# Patient Record
Sex: Female | Born: 1935
Health system: Southern US, Community
[De-identification: ages and names within clinical notes are randomized; demographics above are authoritative.]

## PROBLEM LIST (undated history)

## (undated) DIAGNOSIS — E785 Hyperlipidemia, unspecified: Secondary | ICD-10-CM

## (undated) DIAGNOSIS — C3490 Malignant neoplasm of unspecified part of unspecified bronchus or lung: Secondary | ICD-10-CM

## (undated) DIAGNOSIS — IMO0002 Reserved for concepts with insufficient information to code with codable children: Secondary | ICD-10-CM

## (undated) DIAGNOSIS — I509 Heart failure, unspecified: Secondary | ICD-10-CM

## (undated) DIAGNOSIS — K5792 Diverticulitis of intestine, part unspecified, without perforation or abscess without bleeding: Secondary | ICD-10-CM

## (undated) DIAGNOSIS — F329 Major depressive disorder, single episode, unspecified: Secondary | ICD-10-CM

## (undated) DIAGNOSIS — K219 Gastro-esophageal reflux disease without esophagitis: Secondary | ICD-10-CM

## (undated) DIAGNOSIS — D329 Benign neoplasm of meninges, unspecified: Secondary | ICD-10-CM

## (undated) DIAGNOSIS — D1803 Hemangioma of intra-abdominal structures: Secondary | ICD-10-CM

## (undated) DIAGNOSIS — I10 Essential (primary) hypertension: Secondary | ICD-10-CM

## (undated) DIAGNOSIS — E119 Type 2 diabetes mellitus without complications: Secondary | ICD-10-CM

## (undated) DIAGNOSIS — I639 Cerebral infarction, unspecified: Secondary | ICD-10-CM

## (undated) HISTORY — DX: Essential (primary) hypertension: I10

## (undated) HISTORY — DX: Diverticulitis of intestine, part unspecified, without perforation or abscess without bleeding: K57.92

## (undated) HISTORY — DX: Malignant neoplasm of unspecified part of unspecified bronchus or lung: C34.90

## (undated) HISTORY — DX: Hyperlipidemia, unspecified: E78.5

## (undated) HISTORY — DX: Major depressive disorder, single episode, unspecified: F32.9

## (undated) HISTORY — DX: Heart failure, unspecified: I50.9

## (undated) HISTORY — DX: Cerebral infarction, unspecified: I63.9

## (undated) HISTORY — DX: Reserved for concepts with insufficient information to code with codable children: IMO0002

## (undated) HISTORY — DX: Benign neoplasm of meninges, unspecified: D32.9

## (undated) HISTORY — DX: Hemangioma of intra-abdominal structures: D18.03

## (undated) HISTORY — PX: ABDOMINAL HYSTERECTOMY: SHX81

## (undated) HISTORY — PX: OTHER SURGICAL HISTORY: SHX169

## (undated) HISTORY — DX: Gastro-esophageal reflux disease without esophagitis: K21.9

## (undated) HISTORY — DX: Type 2 diabetes mellitus without complications: E11.9

---

## 1998-02-16 DIAGNOSIS — E119 Type 2 diabetes mellitus without complications: Secondary | ICD-10-CM

## 1998-02-16 HISTORY — DX: Type 2 diabetes mellitus without complications: E11.9

## 1998-10-03 ENCOUNTER — Encounter: Payer: Self-pay | Admitting: Internal Medicine

## 1998-10-03 ENCOUNTER — Ambulatory Visit (HOSPITAL_COMMUNITY): Admission: RE | Admit: 1998-10-03 | Discharge: 1998-10-03 | Payer: Self-pay | Admitting: Internal Medicine

## 1998-11-07 ENCOUNTER — Encounter: Payer: Self-pay | Admitting: Internal Medicine

## 1998-11-07 ENCOUNTER — Ambulatory Visit (HOSPITAL_COMMUNITY): Admission: RE | Admit: 1998-11-07 | Discharge: 1998-11-07 | Payer: Self-pay | Admitting: Internal Medicine

## 1999-09-01 ENCOUNTER — Encounter: Payer: Self-pay | Admitting: Emergency Medicine

## 1999-09-01 ENCOUNTER — Emergency Department (HOSPITAL_COMMUNITY): Admission: EM | Admit: 1999-09-01 | Discharge: 1999-09-01 | Payer: Self-pay | Admitting: Emergency Medicine

## 1999-12-12 ENCOUNTER — Ambulatory Visit (HOSPITAL_COMMUNITY): Admission: RE | Admit: 1999-12-12 | Discharge: 1999-12-12 | Payer: Self-pay | Admitting: *Deleted

## 2000-08-17 ENCOUNTER — Ambulatory Visit (HOSPITAL_COMMUNITY): Admission: RE | Admit: 2000-08-17 | Discharge: 2000-08-17 | Payer: Self-pay | Admitting: Family Medicine

## 2002-03-15 ENCOUNTER — Ambulatory Visit (HOSPITAL_COMMUNITY): Admission: RE | Admit: 2002-03-15 | Discharge: 2002-03-15 | Payer: Self-pay | Admitting: Internal Medicine

## 2002-03-15 ENCOUNTER — Encounter: Admission: RE | Admit: 2002-03-15 | Discharge: 2002-03-15 | Payer: Self-pay | Admitting: Internal Medicine

## 2002-03-15 ENCOUNTER — Encounter: Payer: Self-pay | Admitting: Internal Medicine

## 2002-03-16 ENCOUNTER — Encounter: Admission: RE | Admit: 2002-03-16 | Discharge: 2002-03-16 | Payer: Self-pay | Admitting: Internal Medicine

## 2002-03-22 ENCOUNTER — Encounter: Admission: RE | Admit: 2002-03-22 | Discharge: 2002-03-22 | Payer: Self-pay | Admitting: Internal Medicine

## 2002-03-29 ENCOUNTER — Encounter: Admission: RE | Admit: 2002-03-29 | Discharge: 2002-06-27 | Payer: Self-pay | Admitting: Internal Medicine

## 2002-04-19 ENCOUNTER — Encounter: Admission: RE | Admit: 2002-04-19 | Discharge: 2002-04-19 | Payer: Self-pay | Admitting: Internal Medicine

## 2002-05-01 ENCOUNTER — Encounter: Admission: RE | Admit: 2002-05-01 | Discharge: 2002-05-01 | Payer: Self-pay | Admitting: Internal Medicine

## 2002-05-01 ENCOUNTER — Encounter (INDEPENDENT_AMBULATORY_CARE_PROVIDER_SITE_OTHER): Payer: Self-pay | Admitting: Unknown Physician Specialty

## 2002-05-03 ENCOUNTER — Encounter: Admission: RE | Admit: 2002-05-03 | Discharge: 2002-05-03 | Payer: Self-pay | Admitting: Internal Medicine

## 2002-06-30 ENCOUNTER — Encounter: Admission: RE | Admit: 2002-06-30 | Discharge: 2002-06-30 | Payer: Self-pay | Admitting: Internal Medicine

## 2002-07-01 ENCOUNTER — Inpatient Hospital Stay (HOSPITAL_COMMUNITY): Admission: EM | Admit: 2002-07-01 | Discharge: 2002-07-04 | Payer: Self-pay

## 2002-07-03 ENCOUNTER — Encounter (INDEPENDENT_AMBULATORY_CARE_PROVIDER_SITE_OTHER): Payer: Self-pay | Admitting: Cardiology

## 2002-07-05 ENCOUNTER — Encounter: Admission: RE | Admit: 2002-07-05 | Discharge: 2002-07-05 | Payer: Self-pay | Admitting: Internal Medicine

## 2002-07-20 ENCOUNTER — Encounter: Admission: RE | Admit: 2002-07-20 | Discharge: 2002-07-20 | Payer: Self-pay | Admitting: Internal Medicine

## 2002-09-08 ENCOUNTER — Encounter: Admission: RE | Admit: 2002-09-08 | Discharge: 2002-09-08 | Payer: Self-pay | Admitting: Internal Medicine

## 2002-10-02 ENCOUNTER — Encounter: Payer: Self-pay | Admitting: Gastroenterology

## 2002-11-06 ENCOUNTER — Encounter: Admission: RE | Admit: 2002-11-06 | Discharge: 2002-11-06 | Payer: Self-pay | Admitting: Internal Medicine

## 2002-11-23 ENCOUNTER — Encounter: Admission: RE | Admit: 2002-11-23 | Discharge: 2002-11-23 | Payer: Self-pay | Admitting: Internal Medicine

## 2002-12-27 ENCOUNTER — Encounter: Admission: RE | Admit: 2002-12-27 | Discharge: 2002-12-27 | Payer: Self-pay | Admitting: Internal Medicine

## 2002-12-28 ENCOUNTER — Encounter: Admission: RE | Admit: 2002-12-28 | Discharge: 2003-01-31 | Payer: Self-pay | Admitting: Internal Medicine

## 2003-01-01 ENCOUNTER — Encounter: Admission: RE | Admit: 2003-01-01 | Discharge: 2003-01-01 | Payer: Self-pay | Admitting: Internal Medicine

## 2003-03-02 ENCOUNTER — Encounter: Admission: RE | Admit: 2003-03-02 | Discharge: 2003-03-02 | Payer: Self-pay | Admitting: Internal Medicine

## 2003-03-08 ENCOUNTER — Ambulatory Visit (HOSPITAL_COMMUNITY): Admission: RE | Admit: 2003-03-08 | Discharge: 2003-03-08 | Payer: Self-pay | Admitting: Ophthalmology

## 2003-04-09 ENCOUNTER — Encounter: Admission: RE | Admit: 2003-04-09 | Discharge: 2003-04-09 | Payer: Self-pay | Admitting: Internal Medicine

## 2003-05-03 ENCOUNTER — Encounter: Admission: RE | Admit: 2003-05-03 | Discharge: 2003-05-03 | Payer: Self-pay | Admitting: Internal Medicine

## 2003-05-10 ENCOUNTER — Encounter: Admission: RE | Admit: 2003-05-10 | Discharge: 2003-05-10 | Payer: Self-pay | Admitting: Internal Medicine

## 2003-05-25 ENCOUNTER — Ambulatory Visit (HOSPITAL_COMMUNITY): Admission: RE | Admit: 2003-05-25 | Discharge: 2003-05-25 | Payer: Self-pay | Admitting: Ophthalmology

## 2003-06-08 ENCOUNTER — Encounter: Admission: RE | Admit: 2003-06-08 | Discharge: 2003-06-08 | Payer: Self-pay | Admitting: Internal Medicine

## 2003-06-16 ENCOUNTER — Ambulatory Visit (HOSPITAL_COMMUNITY): Admission: RE | Admit: 2003-06-16 | Discharge: 2003-06-16 | Payer: Self-pay | Admitting: Internal Medicine

## 2003-06-18 ENCOUNTER — Encounter: Admission: RE | Admit: 2003-06-18 | Discharge: 2003-06-18 | Payer: Self-pay | Admitting: Internal Medicine

## 2003-06-29 ENCOUNTER — Encounter: Admission: RE | Admit: 2003-06-29 | Discharge: 2003-06-29 | Payer: Self-pay | Admitting: Internal Medicine

## 2003-07-04 ENCOUNTER — Ambulatory Visit (HOSPITAL_COMMUNITY): Admission: RE | Admit: 2003-07-04 | Discharge: 2003-07-04 | Payer: Self-pay | Admitting: Internal Medicine

## 2003-07-27 ENCOUNTER — Encounter: Admission: RE | Admit: 2003-07-27 | Discharge: 2003-07-27 | Payer: Self-pay | Admitting: Internal Medicine

## 2003-09-06 ENCOUNTER — Encounter: Admission: RE | Admit: 2003-09-06 | Discharge: 2003-09-06 | Payer: Self-pay | Admitting: Internal Medicine

## 2003-10-26 ENCOUNTER — Ambulatory Visit: Payer: Self-pay | Admitting: Internal Medicine

## 2003-11-09 ENCOUNTER — Ambulatory Visit: Payer: Self-pay | Admitting: Internal Medicine

## 2003-12-15 ENCOUNTER — Emergency Department (HOSPITAL_COMMUNITY): Admission: EM | Admit: 2003-12-15 | Discharge: 2003-12-15 | Payer: Self-pay | Admitting: Family Medicine

## 2004-01-22 ENCOUNTER — Ambulatory Visit: Payer: Self-pay | Admitting: Internal Medicine

## 2004-01-29 ENCOUNTER — Ambulatory Visit (HOSPITAL_COMMUNITY): Admission: RE | Admit: 2004-01-29 | Discharge: 2004-01-29 | Payer: Self-pay | Admitting: Internal Medicine

## 2004-01-29 ENCOUNTER — Ambulatory Visit: Payer: Self-pay | Admitting: Internal Medicine

## 2004-02-17 DIAGNOSIS — I639 Cerebral infarction, unspecified: Secondary | ICD-10-CM

## 2004-02-17 HISTORY — DX: Cerebral infarction, unspecified: I63.9

## 2004-02-28 ENCOUNTER — Ambulatory Visit: Payer: Self-pay | Admitting: Internal Medicine

## 2004-03-13 ENCOUNTER — Ambulatory Visit: Payer: Self-pay | Admitting: Internal Medicine

## 2004-05-08 ENCOUNTER — Ambulatory Visit: Payer: Self-pay | Admitting: Internal Medicine

## 2004-06-17 ENCOUNTER — Ambulatory Visit: Payer: Self-pay | Admitting: Internal Medicine

## 2004-06-20 ENCOUNTER — Ambulatory Visit (HOSPITAL_COMMUNITY): Admission: RE | Admit: 2004-06-20 | Discharge: 2004-06-20 | Payer: Self-pay

## 2004-06-24 ENCOUNTER — Ambulatory Visit (HOSPITAL_COMMUNITY): Admission: RE | Admit: 2004-06-24 | Discharge: 2004-06-24 | Payer: Self-pay | Admitting: Internal Medicine

## 2004-07-01 ENCOUNTER — Ambulatory Visit: Payer: Self-pay | Admitting: Internal Medicine

## 2004-07-15 ENCOUNTER — Ambulatory Visit: Payer: Self-pay | Admitting: Internal Medicine

## 2004-07-30 ENCOUNTER — Ambulatory Visit: Payer: Self-pay | Admitting: Internal Medicine

## 2004-08-21 ENCOUNTER — Encounter: Admission: RE | Admit: 2004-08-21 | Discharge: 2004-11-07 | Payer: Self-pay | Admitting: Orthopedic Surgery

## 2004-09-04 ENCOUNTER — Ambulatory Visit (HOSPITAL_COMMUNITY): Admission: RE | Admit: 2004-09-04 | Discharge: 2004-09-04 | Payer: Self-pay | Admitting: Family Medicine

## 2004-09-04 ENCOUNTER — Encounter (INDEPENDENT_AMBULATORY_CARE_PROVIDER_SITE_OTHER): Payer: Self-pay | Admitting: *Deleted

## 2004-09-04 ENCOUNTER — Emergency Department (HOSPITAL_COMMUNITY): Admission: EM | Admit: 2004-09-04 | Discharge: 2004-09-04 | Payer: Self-pay | Admitting: Family Medicine

## 2004-09-08 ENCOUNTER — Ambulatory Visit: Payer: Self-pay | Admitting: Internal Medicine

## 2004-09-11 ENCOUNTER — Encounter (INDEPENDENT_AMBULATORY_CARE_PROVIDER_SITE_OTHER): Payer: Self-pay | Admitting: *Deleted

## 2004-09-11 ENCOUNTER — Ambulatory Visit: Payer: Self-pay | Admitting: Internal Medicine

## 2004-09-11 ENCOUNTER — Ambulatory Visit (HOSPITAL_COMMUNITY): Admission: RE | Admit: 2004-09-11 | Discharge: 2004-09-11 | Payer: Self-pay | Admitting: Internal Medicine

## 2004-09-17 ENCOUNTER — Ambulatory Visit: Payer: Self-pay

## 2004-12-31 ENCOUNTER — Ambulatory Visit: Payer: Self-pay | Admitting: Internal Medicine

## 2005-01-21 ENCOUNTER — Encounter (INDEPENDENT_AMBULATORY_CARE_PROVIDER_SITE_OTHER): Payer: Self-pay | Admitting: Unknown Physician Specialty

## 2005-01-27 ENCOUNTER — Ambulatory Visit (HOSPITAL_COMMUNITY): Admission: RE | Admit: 2005-01-27 | Discharge: 2005-01-27 | Payer: Self-pay | Admitting: *Deleted

## 2005-02-03 ENCOUNTER — Ambulatory Visit: Admission: RE | Admit: 2005-02-03 | Discharge: 2005-02-03 | Payer: Self-pay | Admitting: Internal Medicine

## 2005-03-03 ENCOUNTER — Ambulatory Visit: Payer: Self-pay | Admitting: Internal Medicine

## 2005-03-09 ENCOUNTER — Ambulatory Visit: Payer: Self-pay | Admitting: Hospitalist

## 2005-06-01 ENCOUNTER — Ambulatory Visit: Payer: Self-pay | Admitting: Internal Medicine

## 2005-07-14 ENCOUNTER — Ambulatory Visit: Payer: Self-pay | Admitting: Internal Medicine

## 2005-09-07 ENCOUNTER — Ambulatory Visit: Payer: Self-pay | Admitting: Internal Medicine

## 2005-09-14 ENCOUNTER — Ambulatory Visit: Payer: Self-pay | Admitting: Hospitalist

## 2005-10-20 ENCOUNTER — Encounter (INDEPENDENT_AMBULATORY_CARE_PROVIDER_SITE_OTHER): Payer: Self-pay | Admitting: Unknown Physician Specialty

## 2005-10-20 ENCOUNTER — Ambulatory Visit: Payer: Self-pay | Admitting: Hospitalist

## 2005-12-04 DIAGNOSIS — E785 Hyperlipidemia, unspecified: Secondary | ICD-10-CM

## 2005-12-04 DIAGNOSIS — I152 Hypertension secondary to endocrine disorders: Secondary | ICD-10-CM | POA: Insufficient documentation

## 2005-12-04 DIAGNOSIS — Z794 Long term (current) use of insulin: Secondary | ICD-10-CM

## 2005-12-04 DIAGNOSIS — E1165 Type 2 diabetes mellitus with hyperglycemia: Secondary | ICD-10-CM

## 2005-12-04 DIAGNOSIS — K219 Gastro-esophageal reflux disease without esophagitis: Secondary | ICD-10-CM

## 2005-12-04 DIAGNOSIS — E1169 Type 2 diabetes mellitus with other specified complication: Secondary | ICD-10-CM | POA: Insufficient documentation

## 2005-12-04 DIAGNOSIS — E1159 Type 2 diabetes mellitus with other circulatory complications: Secondary | ICD-10-CM | POA: Insufficient documentation

## 2005-12-04 DIAGNOSIS — E119 Type 2 diabetes mellitus without complications: Secondary | ICD-10-CM | POA: Insufficient documentation

## 2005-12-04 DIAGNOSIS — I10 Essential (primary) hypertension: Secondary | ICD-10-CM

## 2005-12-04 DIAGNOSIS — E1140 Type 2 diabetes mellitus with diabetic neuropathy, unspecified: Secondary | ICD-10-CM

## 2005-12-04 HISTORY — DX: Gastro-esophageal reflux disease without esophagitis: K21.9

## 2005-12-04 HISTORY — DX: Essential (primary) hypertension: I10

## 2005-12-04 HISTORY — DX: Hyperlipidemia, unspecified: E78.5

## 2006-01-22 ENCOUNTER — Ambulatory Visit (HOSPITAL_COMMUNITY): Admission: RE | Admit: 2006-01-22 | Discharge: 2006-01-22 | Payer: Self-pay | Admitting: Internal Medicine

## 2006-01-22 ENCOUNTER — Ambulatory Visit: Payer: Self-pay | Admitting: Internal Medicine

## 2006-01-22 ENCOUNTER — Encounter (INDEPENDENT_AMBULATORY_CARE_PROVIDER_SITE_OTHER): Payer: Self-pay | Admitting: *Deleted

## 2006-01-28 ENCOUNTER — Ambulatory Visit (HOSPITAL_COMMUNITY): Admission: RE | Admit: 2006-01-28 | Discharge: 2006-01-28 | Payer: Self-pay | Admitting: *Deleted

## 2006-01-29 ENCOUNTER — Ambulatory Visit (HOSPITAL_COMMUNITY): Admission: RE | Admit: 2006-01-29 | Discharge: 2006-01-29 | Payer: Self-pay | Admitting: Sports Medicine

## 2006-01-29 LAB — HM MAMMOGRAPHY: HM Mammogram: NEGATIVE

## 2006-02-05 ENCOUNTER — Ambulatory Visit: Payer: Self-pay | Admitting: Internal Medicine

## 2006-02-23 DIAGNOSIS — R1904 Left lower quadrant abdominal swelling, mass and lump: Secondary | ICD-10-CM

## 2006-02-25 ENCOUNTER — Encounter (INDEPENDENT_AMBULATORY_CARE_PROVIDER_SITE_OTHER): Payer: Self-pay | Admitting: *Deleted

## 2006-02-25 ENCOUNTER — Ambulatory Visit (HOSPITAL_COMMUNITY): Admission: RE | Admit: 2006-02-25 | Discharge: 2006-02-25 | Payer: Self-pay | Admitting: *Deleted

## 2006-03-16 ENCOUNTER — Telehealth (INDEPENDENT_AMBULATORY_CARE_PROVIDER_SITE_OTHER): Payer: Self-pay | Admitting: Internal Medicine

## 2006-03-24 ENCOUNTER — Ambulatory Visit: Payer: Self-pay | Admitting: Internal Medicine

## 2006-03-24 ENCOUNTER — Encounter (INDEPENDENT_AMBULATORY_CARE_PROVIDER_SITE_OTHER): Payer: Self-pay | Admitting: Unknown Physician Specialty

## 2006-03-24 DIAGNOSIS — M1990 Unspecified osteoarthritis, unspecified site: Secondary | ICD-10-CM

## 2006-03-24 DIAGNOSIS — M1710 Unilateral primary osteoarthritis, unspecified knee: Secondary | ICD-10-CM

## 2006-03-24 HISTORY — DX: Unilateral primary osteoarthritis, unspecified knee: M17.10

## 2006-03-26 ENCOUNTER — Ambulatory Visit (HOSPITAL_COMMUNITY): Admission: RE | Admit: 2006-03-26 | Discharge: 2006-03-26 | Payer: Self-pay | Admitting: Family Medicine

## 2006-03-26 ENCOUNTER — Telehealth: Payer: Self-pay | Admitting: *Deleted

## 2006-03-30 ENCOUNTER — Encounter (INDEPENDENT_AMBULATORY_CARE_PROVIDER_SITE_OTHER): Payer: Self-pay | Admitting: Unknown Physician Specialty

## 2006-04-20 ENCOUNTER — Telehealth (INDEPENDENT_AMBULATORY_CARE_PROVIDER_SITE_OTHER): Payer: Self-pay | Admitting: *Deleted

## 2006-05-27 ENCOUNTER — Encounter (INDEPENDENT_AMBULATORY_CARE_PROVIDER_SITE_OTHER): Payer: Self-pay | Admitting: Internal Medicine

## 2006-05-27 ENCOUNTER — Ambulatory Visit: Payer: Self-pay | Admitting: Internal Medicine

## 2006-05-27 LAB — CONVERTED CEMR LAB
Bilirubin Urine: NEGATIVE
Blood Glucose, Fingerstick: 309
Calcium: 9.1 mg/dL (ref 8.4–10.5)
Chloride: 103 meq/L (ref 96–112)
Glucose, Bld: 281 mg/dL — ABNORMAL HIGH (ref 70–99)
Glucose, Urine, Semiquant: >=1000
Ketones, ur: NEGATIVE mg/dL
Nitrite: NEGATIVE
Potassium: 3.8 meq/L (ref 3.5–5.3)
Sodium: 139 meq/L (ref 135–145)
Specific Gravity, Urine: 1.046 — ABNORMAL HIGH (ref 1.005–1.03)
Urine Glucose: >1000 mg/dL — AB
Urobilinogen, UA: 0.2
pH: 5 (ref 5.0–8.0)

## 2006-05-28 ENCOUNTER — Encounter (INDEPENDENT_AMBULATORY_CARE_PROVIDER_SITE_OTHER): Payer: Self-pay | Admitting: Internal Medicine

## 2006-05-28 LAB — CONVERTED CEMR LAB: Trichomonal Vaginitis: POSITIVE — AB

## 2006-05-31 ENCOUNTER — Encounter (INDEPENDENT_AMBULATORY_CARE_PROVIDER_SITE_OTHER): Payer: Self-pay | Admitting: Unknown Physician Specialty

## 2006-05-31 LAB — CONVERTED CEMR LAB

## 2006-06-07 ENCOUNTER — Encounter (INDEPENDENT_AMBULATORY_CARE_PROVIDER_SITE_OTHER): Payer: Self-pay | Admitting: Unknown Physician Specialty

## 2006-06-17 ENCOUNTER — Telehealth: Payer: Self-pay | Admitting: *Deleted

## 2006-06-18 ENCOUNTER — Ambulatory Visit: Payer: Self-pay | Admitting: *Deleted

## 2006-06-18 LAB — CONVERTED CEMR LAB
Blood Glucose, Fingerstick: 257
Hgb A1c MFr Bld: 13.5 %

## 2006-07-01 ENCOUNTER — Telehealth (INDEPENDENT_AMBULATORY_CARE_PROVIDER_SITE_OTHER): Payer: Self-pay | Admitting: *Deleted

## 2006-07-02 ENCOUNTER — Ambulatory Visit: Payer: Self-pay | Admitting: Hospitalist

## 2006-07-02 ENCOUNTER — Encounter (INDEPENDENT_AMBULATORY_CARE_PROVIDER_SITE_OTHER): Payer: Self-pay | Admitting: Internal Medicine

## 2006-07-02 DIAGNOSIS — Z8719 Personal history of other diseases of the digestive system: Secondary | ICD-10-CM

## 2006-07-05 LAB — CONVERTED CEMR LAB
Benzodiazepines.: NEGATIVE
Marijuana Metabolite: NEGATIVE
Methadone: NEGATIVE
Opiates: NEGATIVE
Propoxyphene: NEGATIVE

## 2006-09-16 ENCOUNTER — Ambulatory Visit: Payer: Self-pay | Admitting: *Deleted

## 2006-09-16 ENCOUNTER — Encounter (INDEPENDENT_AMBULATORY_CARE_PROVIDER_SITE_OTHER): Payer: Self-pay | Admitting: Internal Medicine

## 2006-09-21 ENCOUNTER — Ambulatory Visit (HOSPITAL_COMMUNITY): Admission: RE | Admit: 2006-09-21 | Discharge: 2006-09-21 | Payer: Self-pay | Admitting: *Deleted

## 2006-09-21 ENCOUNTER — Ambulatory Visit: Payer: Self-pay | Admitting: Internal Medicine

## 2006-09-21 ENCOUNTER — Encounter (INDEPENDENT_AMBULATORY_CARE_PROVIDER_SITE_OTHER): Payer: Self-pay | Admitting: Internal Medicine

## 2006-09-21 DIAGNOSIS — M2350 Chronic instability of knee, unspecified knee: Secondary | ICD-10-CM

## 2006-10-19 ENCOUNTER — Encounter: Admission: RE | Admit: 2006-10-19 | Discharge: 2006-10-19 | Payer: Self-pay | Admitting: Sports Medicine

## 2006-11-04 ENCOUNTER — Encounter: Admission: RE | Admit: 2006-11-04 | Discharge: 2006-12-28 | Payer: Self-pay | Admitting: Unknown Physician Specialty

## 2006-12-15 ENCOUNTER — Encounter (INDEPENDENT_AMBULATORY_CARE_PROVIDER_SITE_OTHER): Payer: Self-pay | Admitting: Internal Medicine

## 2006-12-15 ENCOUNTER — Encounter (INDEPENDENT_AMBULATORY_CARE_PROVIDER_SITE_OTHER): Payer: Self-pay | Admitting: *Deleted

## 2006-12-15 ENCOUNTER — Ambulatory Visit: Payer: Self-pay | Admitting: Hospitalist

## 2006-12-16 ENCOUNTER — Encounter (INDEPENDENT_AMBULATORY_CARE_PROVIDER_SITE_OTHER): Payer: Self-pay | Admitting: *Deleted

## 2006-12-16 LAB — CONVERTED CEMR LAB
Leukocytes, UA: NEGATIVE
RBC / HPF: NONE SEEN (ref <3)
Urine Glucose: >1000 mg/dL — AB
pH: 7 (ref 5.0–8.0)

## 2006-12-29 ENCOUNTER — Telehealth: Payer: Self-pay | Admitting: *Deleted

## 2007-01-07 ENCOUNTER — Ambulatory Visit: Payer: Self-pay | Admitting: Internal Medicine

## 2007-01-27 ENCOUNTER — Telehealth (INDEPENDENT_AMBULATORY_CARE_PROVIDER_SITE_OTHER): Payer: Self-pay | Admitting: *Deleted

## 2007-02-28 ENCOUNTER — Telehealth (INDEPENDENT_AMBULATORY_CARE_PROVIDER_SITE_OTHER): Payer: Self-pay | Admitting: Internal Medicine

## 2007-03-04 ENCOUNTER — Telehealth (INDEPENDENT_AMBULATORY_CARE_PROVIDER_SITE_OTHER): Payer: Self-pay | Admitting: Internal Medicine

## 2007-03-18 ENCOUNTER — Encounter (INDEPENDENT_AMBULATORY_CARE_PROVIDER_SITE_OTHER): Payer: Self-pay | Admitting: Internal Medicine

## 2007-06-29 ENCOUNTER — Telehealth (INDEPENDENT_AMBULATORY_CARE_PROVIDER_SITE_OTHER): Payer: Self-pay | Admitting: Internal Medicine

## 2007-08-25 ENCOUNTER — Ambulatory Visit: Payer: Self-pay | Admitting: Internal Medicine

## 2007-08-25 ENCOUNTER — Encounter: Payer: Self-pay | Admitting: Internal Medicine

## 2007-08-25 DIAGNOSIS — M259 Joint disorder, unspecified: Secondary | ICD-10-CM | POA: Insufficient documentation

## 2007-08-25 LAB — CONVERTED CEMR LAB
BUN: 8 mg/dL (ref 6–23)
Calcium: 9.3 mg/dL (ref 8.4–10.5)
Glucose, Bld: 418 mg/dL — ABNORMAL HIGH (ref 70–99)

## 2007-08-27 ENCOUNTER — Ambulatory Visit (HOSPITAL_COMMUNITY): Admission: RE | Admit: 2007-08-27 | Discharge: 2007-08-27 | Payer: Self-pay | Admitting: *Deleted

## 2007-08-29 ENCOUNTER — Encounter: Payer: Self-pay | Admitting: Internal Medicine

## 2007-08-29 DIAGNOSIS — D492 Neoplasm of unspecified behavior of bone, soft tissue, and skin: Secondary | ICD-10-CM

## 2007-09-01 ENCOUNTER — Ambulatory Visit: Payer: Self-pay | Admitting: *Deleted

## 2007-09-01 ENCOUNTER — Encounter: Payer: Self-pay | Admitting: Internal Medicine

## 2007-09-01 ENCOUNTER — Telehealth (INDEPENDENT_AMBULATORY_CARE_PROVIDER_SITE_OTHER): Payer: Self-pay | Admitting: Internal Medicine

## 2007-09-01 LAB — CONVERTED CEMR LAB

## 2007-10-17 ENCOUNTER — Encounter (INDEPENDENT_AMBULATORY_CARE_PROVIDER_SITE_OTHER): Payer: Self-pay | Admitting: Internal Medicine

## 2007-10-17 ENCOUNTER — Ambulatory Visit: Payer: Self-pay | Admitting: Internal Medicine

## 2007-10-18 ENCOUNTER — Encounter (INDEPENDENT_AMBULATORY_CARE_PROVIDER_SITE_OTHER): Payer: Self-pay | Admitting: Internal Medicine

## 2007-10-18 LAB — CONVERTED CEMR LAB
Bilirubin Urine: NEGATIVE
Ketones, ur: NEGATIVE mg/dL
Protein, ur: NEGATIVE mg/dL
Urobilinogen, UA: 0.2 (ref 0.0–1.0)

## 2007-11-09 ENCOUNTER — Encounter (INDEPENDENT_AMBULATORY_CARE_PROVIDER_SITE_OTHER): Payer: Self-pay | Admitting: *Deleted

## 2007-11-09 ENCOUNTER — Ambulatory Visit (HOSPITAL_BASED_OUTPATIENT_CLINIC_OR_DEPARTMENT_OTHER): Admission: RE | Admit: 2007-11-09 | Discharge: 2007-11-09 | Payer: Self-pay | Admitting: *Deleted

## 2007-11-24 ENCOUNTER — Encounter (INDEPENDENT_AMBULATORY_CARE_PROVIDER_SITE_OTHER): Payer: Self-pay | Admitting: Internal Medicine

## 2007-11-29 ENCOUNTER — Encounter: Payer: Self-pay | Admitting: Internal Medicine

## 2007-11-29 ENCOUNTER — Ambulatory Visit: Payer: Self-pay | Admitting: Internal Medicine

## 2007-11-29 DIAGNOSIS — R32 Unspecified urinary incontinence: Secondary | ICD-10-CM

## 2007-12-01 LAB — CONVERTED CEMR LAB
Albumin: 4 g/dL (ref 3.5–5.2)
Alkaline Phosphatase: 62 units/L (ref 39–117)
BUN: 15 mg/dL (ref 6–23)
Bilirubin Urine: NEGATIVE
CO2: 21 meq/L (ref 19–32)
Eosinophils Absolute: 0.1 10*3/uL (ref 0.0–0.7)
Eosinophils Relative: 1 % (ref 0–5)
Glucose, Bld: 221 mg/dL — ABNORMAL HIGH (ref 70–99)
HCT: 37.8 % (ref 36.0–46.0)
Lymphocytes Relative: 40 % (ref 12–46)
Lymphs Abs: 2.7 10*3/uL (ref 0.7–4.0)
MCV: 79.9 fL (ref 78.0–100.0)
Monocytes Relative: 7 % (ref 3–12)
Potassium: 3.8 meq/L (ref 3.5–5.3)
Protein, ur: 100 mg/dL — AB
RBC: 4.73 M/uL (ref 3.87–5.11)
Specific Gravity, Urine: 1.026 (ref 1.005–1.03)
Total Bilirubin: 0.3 mg/dL (ref 0.3–1.2)
Total Protein: 7.3 g/dL (ref 6.0–8.3)
Urobilinogen, UA: 0.2 (ref 0.0–1.0)
WBC: 6.6 10*3/uL (ref 4.0–10.5)

## 2007-12-06 ENCOUNTER — Ambulatory Visit: Payer: Self-pay | Admitting: Internal Medicine

## 2007-12-06 DIAGNOSIS — R319 Hematuria, unspecified: Secondary | ICD-10-CM

## 2007-12-06 DIAGNOSIS — N952 Postmenopausal atrophic vaginitis: Secondary | ICD-10-CM

## 2007-12-12 ENCOUNTER — Encounter: Admission: RE | Admit: 2007-12-12 | Discharge: 2007-12-12 | Payer: Self-pay | Admitting: *Deleted

## 2007-12-14 ENCOUNTER — Telehealth: Payer: Self-pay | Admitting: Internal Medicine

## 2007-12-15 ENCOUNTER — Ambulatory Visit: Payer: Self-pay | Admitting: Internal Medicine

## 2007-12-15 DIAGNOSIS — J984 Other disorders of lung: Secondary | ICD-10-CM

## 2007-12-15 LAB — CONVERTED CEMR LAB: Blood Glucose, Fingerstick: 274

## 2007-12-19 ENCOUNTER — Ambulatory Visit: Payer: Self-pay | Admitting: Thoracic Surgery

## 2007-12-22 ENCOUNTER — Ambulatory Visit (HOSPITAL_COMMUNITY): Admission: RE | Admit: 2007-12-22 | Discharge: 2007-12-22 | Payer: Self-pay | Admitting: Thoracic Surgery

## 2007-12-29 ENCOUNTER — Encounter: Payer: Self-pay | Admitting: Thoracic Surgery

## 2007-12-29 ENCOUNTER — Ambulatory Visit (HOSPITAL_COMMUNITY): Admission: RE | Admit: 2007-12-29 | Discharge: 2007-12-29 | Payer: Self-pay | Admitting: Thoracic Surgery

## 2007-12-29 ENCOUNTER — Ambulatory Visit: Payer: Self-pay | Admitting: Thoracic Surgery

## 2007-12-29 HISTORY — PX: OTHER SURGICAL HISTORY: SHX169

## 2008-01-03 ENCOUNTER — Ambulatory Visit: Payer: Self-pay | Admitting: Thoracic Surgery

## 2008-01-04 ENCOUNTER — Encounter (INDEPENDENT_AMBULATORY_CARE_PROVIDER_SITE_OTHER): Payer: Self-pay | Admitting: Internal Medicine

## 2008-01-11 ENCOUNTER — Telehealth (INDEPENDENT_AMBULATORY_CARE_PROVIDER_SITE_OTHER): Payer: Self-pay | Admitting: *Deleted

## 2008-01-11 ENCOUNTER — Ambulatory Visit: Payer: Self-pay | Admitting: *Deleted

## 2008-01-20 ENCOUNTER — Ambulatory Visit: Payer: Self-pay | Admitting: Internal Medicine

## 2008-01-20 ENCOUNTER — Encounter (INDEPENDENT_AMBULATORY_CARE_PROVIDER_SITE_OTHER): Payer: Self-pay | Admitting: Internal Medicine

## 2008-01-27 ENCOUNTER — Ambulatory Visit: Payer: Self-pay | Admitting: Internal Medicine

## 2008-01-27 DIAGNOSIS — F4321 Adjustment disorder with depressed mood: Secondary | ICD-10-CM | POA: Insufficient documentation

## 2008-02-03 ENCOUNTER — Telehealth (INDEPENDENT_AMBULATORY_CARE_PROVIDER_SITE_OTHER): Payer: Self-pay | Admitting: *Deleted

## 2008-02-06 ENCOUNTER — Encounter (INDEPENDENT_AMBULATORY_CARE_PROVIDER_SITE_OTHER): Payer: Self-pay | Admitting: Internal Medicine

## 2008-02-22 ENCOUNTER — Encounter (INDEPENDENT_AMBULATORY_CARE_PROVIDER_SITE_OTHER): Payer: Self-pay | Admitting: Internal Medicine

## 2008-03-23 DIAGNOSIS — D1739 Benign lipomatous neoplasm of skin and subcutaneous tissue of other sites: Secondary | ICD-10-CM | POA: Insufficient documentation

## 2008-04-06 ENCOUNTER — Encounter (INDEPENDENT_AMBULATORY_CARE_PROVIDER_SITE_OTHER): Payer: Self-pay | Admitting: Internal Medicine

## 2008-04-06 ENCOUNTER — Ambulatory Visit: Payer: Self-pay | Admitting: *Deleted

## 2008-04-06 DIAGNOSIS — M25519 Pain in unspecified shoulder: Secondary | ICD-10-CM

## 2008-04-06 LAB — CONVERTED CEMR LAB
ALT: <8 units/L (ref 0–35)
Albumin: 3.9 g/dL (ref 3.5–5.2)
Blood Glucose, Fingerstick: 318
CO2: 23 meq/L (ref 19–32)
Calcium: 9.3 mg/dL (ref 8.4–10.5)
Chloride: 100 meq/L (ref 96–112)
Glucose, Bld: 310 mg/dL — ABNORMAL HIGH (ref 70–99)
Potassium: 4.6 meq/L (ref 3.5–5.3)
Sodium: 137 meq/L (ref 135–145)
Total Protein: 7 g/dL (ref 6.0–8.3)

## 2008-04-10 ENCOUNTER — Encounter: Admission: RE | Admit: 2008-04-10 | Discharge: 2008-04-10 | Payer: Self-pay | Admitting: Thoracic Surgery

## 2008-04-10 ENCOUNTER — Encounter (INDEPENDENT_AMBULATORY_CARE_PROVIDER_SITE_OTHER): Payer: Self-pay | Admitting: Internal Medicine

## 2008-04-10 ENCOUNTER — Ambulatory Visit: Payer: Self-pay | Admitting: Thoracic Surgery

## 2008-05-17 ENCOUNTER — Telehealth: Payer: Self-pay | Admitting: Internal Medicine

## 2008-07-26 ENCOUNTER — Encounter (INDEPENDENT_AMBULATORY_CARE_PROVIDER_SITE_OTHER): Payer: Self-pay | Admitting: Internal Medicine

## 2008-07-26 ENCOUNTER — Ambulatory Visit: Payer: Self-pay | Admitting: Internal Medicine

## 2008-07-26 DIAGNOSIS — B354 Tinea corporis: Secondary | ICD-10-CM | POA: Insufficient documentation

## 2008-07-26 LAB — CONVERTED CEMR LAB
Blood Glucose, Fingerstick: 233
Hgb A1c MFr Bld: 12.3 %

## 2008-07-27 ENCOUNTER — Encounter (INDEPENDENT_AMBULATORY_CARE_PROVIDER_SITE_OTHER): Payer: Self-pay | Admitting: Internal Medicine

## 2008-07-27 LAB — CONVERTED CEMR LAB
AST: 10 units/L (ref 0–37)
Albumin: 3.8 g/dL (ref 3.5–5.2)
Bilirubin, Direct: <0.1 mg/dL (ref 0.0–0.3)
HDL: 44 mg/dL (ref >39)
Microalb Creat Ratio: 14.2 mg/g (ref 0.0–30.0)
Total Bilirubin: 0.3 mg/dL (ref 0.3–1.2)
Total CHOL/HDL Ratio: 5.5
VLDL: 22 mg/dL (ref 0–40)

## 2008-08-01 ENCOUNTER — Ambulatory Visit: Payer: Self-pay | Admitting: Internal Medicine

## 2008-11-27 ENCOUNTER — Ambulatory Visit: Payer: Self-pay | Admitting: Internal Medicine

## 2008-11-27 LAB — CONVERTED CEMR LAB
Blood Glucose, Fingerstick: 350
Hgb A1c MFr Bld: 12 %

## 2008-12-04 ENCOUNTER — Ambulatory Visit: Payer: Self-pay | Admitting: Internal Medicine

## 2008-12-04 ENCOUNTER — Telehealth (INDEPENDENT_AMBULATORY_CARE_PROVIDER_SITE_OTHER): Payer: Self-pay | Admitting: *Deleted

## 2008-12-04 ENCOUNTER — Encounter: Payer: Self-pay | Admitting: Internal Medicine

## 2008-12-04 LAB — CONVERTED CEMR LAB

## 2008-12-06 ENCOUNTER — Telehealth (INDEPENDENT_AMBULATORY_CARE_PROVIDER_SITE_OTHER): Payer: Self-pay | Admitting: *Deleted

## 2008-12-14 ENCOUNTER — Encounter: Payer: Self-pay | Admitting: Internal Medicine

## 2008-12-14 LAB — HM DIABETES EYE EXAM: HM Diabetic Eye Exam: NORMAL

## 2009-03-20 ENCOUNTER — Ambulatory Visit: Payer: Self-pay | Admitting: Internal Medicine

## 2009-03-20 DIAGNOSIS — D4959 Neoplasm of unspecified behavior of other genitourinary organ: Secondary | ICD-10-CM | POA: Insufficient documentation

## 2009-03-20 LAB — CONVERTED CEMR LAB
Blood Glucose, AC Bkfst: 351 mg/dL
Ketones, urine, test strip: NEGATIVE
Nitrite: NEGATIVE
Urobilinogen, UA: NEGATIVE
pH: 5

## 2009-03-21 LAB — CONVERTED CEMR LAB
ALT: 8 units/L (ref 0–35)
Bilirubin Urine: NEGATIVE
CO2: 29 meq/L (ref 19–32)
Calcium: 9 mg/dL (ref 8.4–10.5)
Casts: NONE SEEN /lpf
Chloride: 98 meq/L (ref 96–112)
Cholesterol: 189 mg/dL (ref 0–200)
Crystals: NONE SEEN
Glucose, Bld: 349 mg/dL — ABNORMAL HIGH (ref 70–99)
Sodium: 139 meq/L (ref 135–145)
Specific Gravity, Urine: 1.032 — ABNORMAL HIGH (ref 1.005–1.0)
Total Protein: 7 g/dL (ref 6.0–8.3)
Triglycerides: 108 mg/dL (ref <150)
Urine Glucose: >1000 mg/dL — AB
pH: 5 (ref 5.0–8.0)

## 2009-05-07 ENCOUNTER — Encounter: Payer: Self-pay | Admitting: Internal Medicine

## 2009-05-17 ENCOUNTER — Ambulatory Visit (HOSPITAL_COMMUNITY): Admission: RE | Admit: 2009-05-17 | Discharge: 2009-05-17 | Payer: Self-pay | Admitting: Internal Medicine

## 2009-05-17 ENCOUNTER — Ambulatory Visit: Payer: Self-pay | Admitting: Internal Medicine

## 2009-07-10 ENCOUNTER — Telehealth: Payer: Self-pay | Admitting: Internal Medicine

## 2009-07-12 ENCOUNTER — Ambulatory Visit: Payer: Self-pay | Admitting: Infectious Disease

## 2009-07-18 ENCOUNTER — Telehealth: Payer: Self-pay | Admitting: *Deleted

## 2009-07-22 ENCOUNTER — Ambulatory Visit: Payer: Self-pay | Admitting: Family Medicine

## 2009-08-12 ENCOUNTER — Ambulatory Visit: Payer: Self-pay | Admitting: Family Medicine

## 2009-09-05 ENCOUNTER — Encounter: Payer: Self-pay | Admitting: Internal Medicine

## 2009-10-08 ENCOUNTER — Telehealth: Payer: Self-pay | Admitting: Internal Medicine

## 2009-11-25 ENCOUNTER — Encounter: Payer: Self-pay | Admitting: Internal Medicine

## 2009-12-16 ENCOUNTER — Ambulatory Visit: Payer: Self-pay | Admitting: Internal Medicine

## 2009-12-16 LAB — CONVERTED CEMR LAB
ALT: 9 units/L (ref 0–35)
Blood Glucose, AC Bkfst: 242 mg/dL
CO2: 32 meq/L (ref 19–32)
Calcium: 9.1 mg/dL (ref 8.4–10.5)
Chloride: 96 meq/L (ref 96–112)
Cholesterol: 179 mg/dL (ref 0–200)
Creatinine, Ser: 0.6 mg/dL (ref 0.40–1.20)
Glucose, Bld: 233 mg/dL — ABNORMAL HIGH (ref 70–99)
Total CHOL/HDL Ratio: 4.7

## 2009-12-16 LAB — HM DIABETES FOOT EXAM

## 2010-01-15 ENCOUNTER — Encounter
Admission: RE | Admit: 2010-01-15 | Discharge: 2010-02-11 | Payer: Self-pay | Source: Home / Self Care | Attending: Internal Medicine | Admitting: Internal Medicine

## 2010-01-20 ENCOUNTER — Encounter: Payer: Self-pay | Admitting: Internal Medicine

## 2010-01-24 ENCOUNTER — Telehealth: Payer: Self-pay | Admitting: Internal Medicine

## 2010-02-04 ENCOUNTER — Telehealth: Payer: Self-pay | Admitting: *Deleted

## 2010-02-11 ENCOUNTER — Encounter: Payer: Self-pay | Admitting: Internal Medicine

## 2010-02-12 ENCOUNTER — Ambulatory Visit: Payer: Self-pay | Admitting: Internal Medicine

## 2010-02-12 LAB — CONVERTED CEMR LAB: Blood Glucose, Fingerstick: 219

## 2010-02-18 ENCOUNTER — Encounter: Admission: RE | Admit: 2010-02-18 | Payer: Self-pay | Source: Home / Self Care | Admitting: Internal Medicine

## 2010-02-20 ENCOUNTER — Telehealth (INDEPENDENT_AMBULATORY_CARE_PROVIDER_SITE_OTHER): Payer: Self-pay | Admitting: *Deleted

## 2010-02-20 ENCOUNTER — Ambulatory Visit
Admission: RE | Admit: 2010-02-20 | Discharge: 2010-02-20 | Payer: Self-pay | Source: Home / Self Care | Attending: Internal Medicine | Admitting: Internal Medicine

## 2010-02-20 DIAGNOSIS — K224 Dyskinesia of esophagus: Secondary | ICD-10-CM | POA: Insufficient documentation

## 2010-02-20 DIAGNOSIS — R634 Abnormal weight loss: Secondary | ICD-10-CM | POA: Insufficient documentation

## 2010-02-20 LAB — GLUCOSE, CAPILLARY: Glucose-Capillary: 217 mg/dL — ABNORMAL HIGH (ref 70–99)

## 2010-02-21 ENCOUNTER — Ambulatory Visit
Admission: RE | Admit: 2010-02-21 | Discharge: 2010-02-21 | Payer: Self-pay | Source: Home / Self Care | Attending: Internal Medicine | Admitting: Internal Medicine

## 2010-02-21 LAB — CONVERTED CEMR LAB
AST: 9 units/L (ref 0–37)
Alkaline Phosphatase: 57 units/L (ref 39–117)
BUN: 11 mg/dL (ref 6–23)
Basophils Absolute: 0 10*3/uL (ref 0.0–0.1)
Basophils Relative: 0 % (ref 0–1)
Creatinine, Ser: 0.7 mg/dL (ref 0.40–1.20)
Eosinophils Relative: 0 % (ref 0–5)
Glucose, Bld: 295 mg/dL — ABNORMAL HIGH (ref 70–99)
HCT: 40.9 % (ref 36.0–46.0)
Hemoglobin: 12.4 g/dL (ref 12.0–15.0)
Lymphocytes Relative: 45 % (ref 12–46)
MCHC: 30.3 g/dL (ref 30.0–36.0)
Monocytes Absolute: 0.5 10*3/uL (ref 0.1–1.0)
Monocytes Relative: 10 % (ref 3–12)
RBC: 5.02 M/uL (ref 3.87–5.11)
RDW: 15.2 % (ref 11.5–15.5)

## 2010-03-06 ENCOUNTER — Encounter: Payer: Self-pay | Admitting: Gastroenterology

## 2010-03-09 ENCOUNTER — Encounter: Payer: Self-pay | Admitting: Internal Medicine

## 2010-03-10 ENCOUNTER — Encounter: Payer: Self-pay | Admitting: Thoracic Surgery

## 2010-03-16 LAB — CONVERTED CEMR LAB
Alkaline Phosphatase: 71 units/L (ref 39–117)
Creatinine, Ser: 0.99 mg/dL (ref 0.40–1.20)
Eosinophils Absolute: 0 10*3/uL (ref 0.0–0.7)
Eosinophils Relative: 1 % (ref 0–5)
Glucose, Bld: 593 mg/dL (ref 70–99)
HCT: 43.2 % (ref 36.0–46.0)
HDL: 44 mg/dL (ref >39)
LDL Cholesterol: 199 mg/dL — ABNORMAL HIGH (ref 0–99)
Lymphs Abs: 1.9 10*3/uL (ref 0.7–4.0)
MCV: 79.2 fL (ref 78.0–100.0)
Platelets: 332 10*3/uL (ref 150–400)
RDW: 14.8 % (ref 11.5–15.5)
Sodium: 131 meq/L — ABNORMAL LOW (ref 135–145)
Total Bilirubin: 0.8 mg/dL (ref 0.3–1.2)
Total CHOL/HDL Ratio: 6.3
Total Protein: 7.8 g/dL (ref 6.0–8.3)
Triglycerides: 174 mg/dL — ABNORMAL HIGH (ref <150)
VLDL: 35 mg/dL (ref 0–40)
WBC: 5.4 10*3/uL (ref 4.0–10.5)

## 2010-03-18 NOTE — Assessment & Plan Note (Signed)
Summary: R arm pain x 3mon, urine,hematuriax2days/pcp-magick/hla   Vital Signs:  Patient profile:   77 year old female Height:      64 inches (162.56 cm) Weight:      203.1 pounds (92.32 kg) BMI:     34.99 Temp:     98.6 degrees F (37.00 degrees C) oral Pulse rate:   100 / minute BP sitting:   142 / 80  (right arm) Cuff size:   regular  Vitals Entered By: Krystal Eaton [facility]) (March 20, 2009 10:09 AM) CC: pt c/o right arm pain for over , noticed blood inurine x 4days,  pain in vaginal  Is Patient Diabetic? Yes Did you bring your meter with you today? No Pain Assessment Patient in pain? yes      Nutritional Status BMI of > 30 = obese  Does patient need assistance? Functional Status Cook/clean Ambulation Impaired:Risk for fall Comments cane   Primary Care Provider:  Mliss Sax MD  CC:  pt c/o right arm pain for over , noticed blood inurine x 4days, and pain in vaginal .  History of Present Illness: 77 y/o w with PMh as listed comes with cc of rash on the vulva  The rash started more than a week ago. not increasing or decreasing. feels like itching. No vaginal discharge. Applied cornstarch and baby powder on the area. fist episode like this. No fever, chills, dysurea, abdominal pain. Not sexually active.   Also complaints of pain in theright arm since a long time. Had a simialr pain the left arm in the ast and had an orthopedic procedure per the patient. No swelling, injury, redness of the shoulder joint or difficulty in movement.    Preventive Screening-Counseling & Management  Alcohol-Tobacco     Smoking Status: quit     Year Quit: 62yr     Passive Smoke Exposure: no  Clinical Reports Reviewed:  Pap Smear:  05/31/2006:  Specimen Adequacy: Satisfactory for evaluation.   Interpretation/Result:Negative for intraepithelial Lesion or Malignancy.     Current Medications (verified): 1)  Aspirin 81 Mg Chew (Aspirin) .... Take 1 Tablet By Mouth Once A  Day 2)  Vytorin 10-40 Mg Tabs (Ezetimibe-Simvastatin) .... Take 1 Tablet By Mouth Once A Day 3)  Novolog Flexpen 100 Unit/ml Soln (Insulin Aspart) .... Inject 5 Units 15 Minutes Before Your Lunch and Dinner 4)  Norvasc 10 Mg Tabs (Amlodipine Besylate) .... Take 1 Tablet By Mouth Once A Day 5)  Lantus Solostar 100 Unit/ml  Soln (Insulin Glargine) .... Inject 60 Units Subcutaneously At Bedtime 6)  Pen Needles 31g X 8 Mm  Misc (Insulin Pen Needle) .... Use To Inject Insulin 4x Daily: Lantus Once Daily and Novolog 3x Daily 7)  Freestyle Lite Test  Strp (Glucose Blood) .... Use To Check Your Blood Sugar 3x Daily- Dx Code 250.00 8)  Lisinopril-Hydrochlorothiazide 20-25 Mg Tabs (Lisinopril-Hydrochlorothiazide) .... Take 1 Tablet By Mouth Once A Day 9)  Lancets  Misc (Lancets) .... Use To Check Blood Sugar 3x Daily- Dx Code 250.00  Allergies (verified): No Known Drug Allergies  Past History:  Past Medical History: Last updated: 08/25/2007 Diabetes mellitus, type II - HbA1c of 8.7 in 12/07 GERD Hyperlipidemia - 245/116/39/183    7/07 Hypertension Cerebrovascular accident, hx of  2006.           Rt embolic stroke, no residual deficit ?h/o Skin lesion bil           Diff included Tinea Versicolor and Annularis Granulare  Biopsy results not in Echart. DEXA scan - 01/27/06 results of which not available. Diverticulitis, hx of  Past Surgical History: Last updated: 08/25/2007 Surgery on the right ankle for broken ankle 10 yrs back. Surgery on the left shoulder -2 yrs back. Hysterectomy - 34 yrs back.  Family History: Last updated: 03/24/2006 Mother - Does not know  Father - Infection Siblings - DM, HTN No F/H/O Cancer  Social History: Last updated: 03/24/2006 Occupation: Day[employer] Single Former Smoker - quit 5 yrs ago  Risk Factors: Exercise: no (11/27/2008)  Risk Factors: Smoking Status: quit (03/20/2009) Passive Smoke Exposure: no (03/20/2009)  Review of Systems  The  patient denies anorexia, fever, weight loss, weight gain, vision loss, decreased hearing, hoarseness, chest pain, syncope, dyspnea on exertion, peripheral edema, prolonged cough, headaches, hemoptysis, abdominal pain, melena, hematochezia, severe indigestion/heartburn, hematuria, incontinence, genital sores, muscle weakness, suspicious skin lesions, transient blindness, difficulty walking, depression, unusual weight change, abnormal bleeding, enlarged lymph nodes, angioedema, breast masses, and testicular masses.    Physical Exam  General:  Well-developed,well-nourished,in no acute distress; alert,appropriate and cooperative throughout examination Head:  normocephalic and atraumatic.   Mouth:  pharynx pink and moist and edentulous.   Lungs:  Normal respiratory effort, chest expands symmetrically. Lungs are clear to auscultation, no crackles or wheezes. Heart:  Normal rate and regular rhythm. S1 and S2 normal without gallop, murmur, click, rub or other extra sounds. Abdomen:  soft, non-tender, normal bowel sounds, and no distention.   Genitalia:  Rahs sorrunding vulva. pinkish with cornstarch applied on it. Extends upto both the inner thighs. Also, erythema of the introitus. No discharge. Foul smelling. No inguinal lymphadenopathy appreciated. labial erythema and labial edema.  No cyst.  Msk:  No deformity or scoliosis noted of thoracic or lumbar spine.   Pulses:  dorsalis pedis pulses normal bilaterally  Extremities:  no edema Neurologic:  OrientedX3, cranial nerver 2-12 intact,strength good in all extremities, sensations normal to light touch, reflexes 2+ b/l, gait normal    Impression & Recommendations:  Problem # 1:  LESION, VULVA (ICD-236.3) looks like fungal infection. Erythema of the vaginal mucosa makes fungal infection. There could be some whitish curd like exudates but the application so much cornstarch makes it hard to differentiate. Given her uncontrolled DM and poor hygienic condition  would predispose to candida infection. This is a sporadic episode so would not check HIV. Will try to treat empirically with diflucan. Will advise not to apply anything to the affected area and keep it dry and clean. Will also check UA/Urine culture.   Problem # 2:  DIABETES MELLITUS, TYPE II (ICD-250.00) DM uncontrolled. Has not brought meter. Says takes her medication regularly. Has not seen Jamison Neighbor as her number was unreachable. Marland Kitchen She will still need a better contrl by increasing her insulin dosage based on meter reading. Asked her to come back with meter at next appointment. No change at this time. Will follow up on next appointment.   Her updated medication list for this problem includes:    Aspirin 81 Mg Chew (Aspirin) .Marland Kitchen... Take 1 tablet by mouth once a day    Novolog Flexpen 100 Unit/ml Soln (Insulin aspart) ..... Inject 5 units 15 minutes before your lunch and dinner    Lantus Solostar 100 Unit/ml Soln (Insulin glargine) ..... Inject 60 units subcutaneously at bedtime    Lisinopril-hydrochlorothiazide 20-25 Mg Tabs (Lisinopril-hydrochlorothiazide) .Marland Kitchen... Take 1 tablet by mouth once a day  Orders: T-Hgb A1C (in-house) (16109UE) T- Capillary Blood Glucose (45409) T-Comprehensive  Metabolic Panel [id number])  Labs Reviewed: Creat: 0.90 (04/06/2008)    Reviewed HgBA1c results: 12.1 (03/20/2009)  12.0 (11/27/2008)  Problem # 3:  HYPERLIPIDEMIA (ICD-272.4) Will check lipid profile. Pt had a bread >5 hrs ago.  Her updated medication list for this problem includes:    Vytorin 10-40 Mg Tabs (Ezetimibe-simvastatin) .Marland Kitchen... Take 1 tablet by mouth once a day  Orders: T-Lipid Profile [id number])  Labs Reviewed: SGOT: 10 (07/26/2008)   SGPT: 11 (07/26/2008)   HDL:44 (07/26/2008), 44 (09/01/2007)  LDL:175 (07/26/2008), 199 (09/01/2007)  Chol:241 (07/26/2008), 278 (09/01/2007)  Trig:111 (07/26/2008), 174 (09/01/2007)  Problem # 4:  SHOULDER PAIN (ICD-719.41) She asks for a orhtopedic  referral for her shoulder. She swa Dr. Rennis Chris the last time but no records available. Will schedule an appointment with him The following medications were removed from the medication list:    Darvocet-n 50 50-325 Mg Tabs (Propoxyphene n-apap) .Marland Kitchen... Take 1 tablet by mouth three times a day as needed for pain Her updated medication list for this problem includes:    Aspirin 81 Mg Chew (Aspirin) .Marland Kitchen... Take 1 tablet by mouth once a day  Discussed shoulder exercises, use of moist heat or ice, and medication.   Complete Medication List: 1)  Aspirin 81 Mg Chew (Aspirin) .... Take 1 tablet by mouth once a day 2)  Vytorin 10-40 Mg Tabs (Ezetimibe-simvastatin) .... Take 1 tablet by mouth once a day 3)  Novolog Flexpen 100 Unit/ml Soln (Insulin aspart) .... Inject 5 units 15 minutes before your lunch and dinner 4)  Norvasc 10 Mg Tabs (Amlodipine besylate) .... Take 1 tablet by mouth once a day 5)  Lantus Solostar 100 Unit/ml Soln (Insulin glargine) .... Inject 60 units subcutaneously at bedtime 6)  Pen Needles 31g X 8 Mm Misc (Insulin pen needle) .... Use to inject insulin 4x daily: lantus once daily and novolog 3x daily 7)  Freestyle Lite Test Strp (Glucose blood) .... Use to check your blood sugar 3x daily- dx code 250.00 8)  Lisinopril-hydrochlorothiazide 20-25 Mg Tabs (Lisinopril-hydrochlorothiazide) .... Take 1 tablet by mouth once a day 9)  Lancets Misc (Lancets) .... Use to check blood sugar 3x daily- dx code 250.00 10)  Diflucan 200 Mg Tabs (Fluconazole) .Marland Kitchen.. 1 tab every third day.take 3 tabs. if not better, take 3 more.  Other Orders: T-Urinalysis Dipstick only ([id number]) T-Urinalysis ([id number]) T-Culture, Urine ([id number]) TD Toxoids IM 7 YR + (40102) Admin 1st Vaccine (72536) Orthopedic Referral (Ortho)  Patient Instructions: 1)  I have sent the antibiotic to the pharmacy. Take it as orescribed. In the meantime, keep the area dry. DO not apply anything there 2)  See Jamison Neighbor in a  month for diabetes care. 3)  Follow up appointment in a month Prescriptions: DIFLUCAN 200 MG TABS (FLUCONAZOLE) 1 tab every third day.Take 3 tabs. If not better, take 3 more.  #6 x 0   Entered and Authorized by:   Bethel Born MD   Signed by:   Bethel Born MD on 03/20/2009   Method used:   [facility]ronically to        [facility] [address] [phone]* (retail)       [address]       [place], [place]  [id number]       Ph: [phone] or [phone]       Fax: [phone]   RxID:   [id number]  Process Orders Check Orders Results:     [facility]: Check successful Tests Sent for requisitioning (March 20, 2009 9:42 PM):     03/20/2009: [facility] -- T-Comprehensive Metabolic Panel [[id number]] (signed)     03/20/2009: [facility] -- T-Lipid Profile [id number] (signed)     03/20/2009: [facility] -- T-Urinalysis [[id number]] (signed)     03/20/2009: [facility] -- T-Culture, Urine [[id number]] (signed)    Laboratory Results   Urine Tests  Date/Time Received: Krystal Eaton [facility])  March 20, 2009 10:27 AM  Date/Time Reported: Krystal Eaton [facility])  March 20, 2009 10:28 AM   Routine Urinalysis   Color: yellow Appearance: Cloudy Glucose: >=1000   (Normal Range: Negative) Bilirubin: negative   (Normal Range: Negative) Ketone: negative   (Normal Range: Negative) Spec. Gravity: 1.015   (Normal Range: 1.003-1.035) Blood: trace-intact   (Normal Range: Negative) pH: 5.0   (Normal Range: 5.0-8.0) Protein: negative   (Normal Range: Negative) Urobilinogen: negative   (Normal Range: 0-1) Nitrite: negative   (Normal Range: Negative) Leukocyte Esterace: trace   (Normal Range: Negative)     Blood Tests   Date/Time Received: March 20, 2009 10:30 AM  Date/Time Reported: Burke Keels  March 20, 2009 10:30 AM   HGBA1C: 12.1%   (Normal Range:  Non-Diabetic - 3-6%   Control Diabetic - 6-8%) CBG Fasting:: 351     Laboratory Results   Urine Tests    Routine Urinalysis   Color: yellow Appearance: Cloudy Glucose: >=1000   (Normal Range: Negative) Bilirubin: negative   (Normal Range: Negative) Ketone: negative   (Normal Range: Negative) Spec. Gravity: 1.015   (Normal Range: 1.003-1.035) Blood: trace-intact   (Normal Range: Negative) pH: 5.0   (Normal Range: 5.0-8.0) Protein: negative   (Normal Range: Negative) Urobilinogen: negative   (Normal Range: 0-1) Nitrite: negative   (Normal Range: Negative) Leukocyte Esterace: trace   (Normal Range: Negative)     Blood Tests     HGBA1C: 12.1%   (Normal Range: Non-Diabetic - 3-6%   Control Diabetic - 6-8%) CBG Fasting:: 351mg /dL     Prevention & Chronic Care Immunizations   Influenza vaccine: Fluvax 3+  (11/27/2008)    Tetanus booster: 03/20/2009: Td    Pneumococcal vaccine: Not documented   Pneumococcal vaccine deferral: Deferred  (03/20/2009)    H. zoster vaccine: Not documented   H. zoster vaccine deferral: Deferred  (03/20/2009)  Colorectal Screening   Hemoccult: Not documented   Hemoccult action/deferral: Deferred  (03/20/2009)    Colonoscopy: Not documented   Colonoscopy action/deferral: Refused  (03/20/2009)  Other Screening   Pap smear: Specimen Adequacy: Satisfactory for evaluation.   Interpretation/Result:Negative for intraepithelial Lesion or Malignancy.     (05/31/2006)   Pap smear action/deferral: Deferred  (03/20/2009)   Pap smear due: 05/2007    Mammogram: No specific mammographic evidence of malignancy    (01/29/2006)   Mammogram action/deferral: Refused  (03/20/2009)   Mammogram due: 01/2007    DXA bone density scan: Not documented   DXA bone density action/deferral: Deferred  (03/20/2009)  Reports requested:  Smoking status: quit  (03/20/2009)  Diabetes Mellitus   HgbA1C: 12.1  (03/20/2009)    Eye exam: Not documented   Last  eye exam report requested.   Diabetic eye exam action/deferral: Deferred  (03/20/2009)    Foot exam: yes  (07/26/2008)   High risk foot: No  (07/26/2008)   Foot care education: Not documented    Urine microalbumin/creatinine ratio: 14.2  (07/26/2008)    Diabetes flowsheet  reviewed?: Yes   Progress toward A1C goal: Unchanged  Lipids   Total Cholesterol: 241  (07/26/2008)   LDL: 175  (07/26/2008)   LDL Direct: Not documented   HDL: 44  (07/26/2008)   Triglycerides: 111  (07/26/2008)    SGOT (AST): 10  (07/26/2008)   SGPT (ALT): 11  (07/26/2008) CMP ordered    Alkaline phosphatase: 66  (07/26/2008)   Total bilirubin: 0.3  (07/26/2008)    Lipid flowsheet reviewed?: Yes   Progress toward LDL goal: Improved  Hypertension   Last Blood Pressure: 142 / 80  (03/20/2009)   Serum creatinine: 0.90  (04/06/2008)   Serum potassium 4.6  (04/06/2008) CMP ordered     Hypertension flowsheet reviewed?: Yes   Progress toward BP goal: Unchanged  Self-Management Support :   Personal Goals (by the next clinic visit) :     Personal A1C goal: 8  (03/20/2009)     Personal blood pressure goal: 130/80  (03/20/2009)     Personal LDL goal: 100  (03/20/2009)    Patient will work on the following items until the next clinic visit to reach self-care goals:     Medications and monitoring: take my medicines every day, check my blood sugar  (03/20/2009)     Eating: drink diet soda or water instead of juice or soda, eat foods that are low in salt, eat baked foods instead of fried foods  (03/20/2009)     Other: bring meter to every visit  (03/20/2009)    Diabetes self-management support: Copy of home glucose meter record  (12/04/2008)   Last diabetes self-management training by diabetes educator: 12/04/2008    Hypertension self-management support: Not documented    Lipid self-management support: Not documented    Nursing Instructions: Request report of last diabetic eye exam Give tetanus booster  today     Immunizations Administered:  Tetanus Vaccine:    Vaccine Type: Td    Site: left deltoid    Mfr: Sanofi Pasteur    Dose: 0.5 ml    Route: IM    Given by: Krystal Eaton ([facility])    Exp. Date: 12/18/2010    Lot #: [id number]    VIS given: 01/04/07 version given March 20, 2009.

## 2010-03-18 NOTE — Progress Notes (Signed)
Summary: [facility] PT referral form  [facility] PT referral form   Imported By: Marily Memos 08/12/2009 14:25:04  _____________________________________________________________________  External Attachment:    Type:   Image     Comment:   External Document

## 2010-03-18 NOTE — Progress Notes (Signed)
  Phone Note Outgoing Call   Call placed by: Theotis Barrio NT II,  July 18, 2009 4:45 PM Call placed to: Patient Details for Reason: SPORT S MEDICINE Summary of Call: CALLED [facility], SPOKE TO HER SON  GERALD KENDRICK - GVIE HIM THE APPT FOR THE [facility] / JUNE 6, 011 @ 10:15AM [address] [employer]. Lela Sturdivant NT II  July 18, 2009 4:47 PM

## 2010-03-18 NOTE — Letter (Signed)
Summary: [facility]  [facility]   Imported By: Shon Hough 11/29/2009 15:27:58  _____________________________________________________________________  External Attachment:    Type:   Image     Comment:   External Document

## 2010-03-18 NOTE — Assessment & Plan Note (Signed)
Summary: F/U ARM,[facility]   Vital Signs:  Patient profile:   77 year old female BP sitting:   121 / 79  Vitals Entered By: Lillia Pauls CMA (August 12, 2009 11:51 AM)  Primary Care Provider:  Mliss Sax MD   History of Present Illness: f/u right shoulder pain---about 20% better after injection. Not able to do many of exercises due to pain. Pain is keepingher awake at night. Seems to radiate down her arm at times, into knuckles of hand. ache, throbbing.  Allergies: No Known Drug Allergies  Review of Systems       Please see HPI for additional ROS.   Physical Exam  General:  alert, well-developed, well-nourished, and well-hydrated.   Msk:  Rigt ABduction to 110 degrees, forward flexion to 90 degrees.    Impression & Recommendations:  Problem # 1:  SHOULDER PAIN, RIGHT (ICD-719.41) partial rotator cuff articular surface tear, some improvement from subacromial injection, unable to perform her HEP secondary to p[ain. will set up with PT and start tramodol for pain at night and can use one during day as needed. rtc 5 w  Complete Medication List: 1)  Aspirin 81 Mg Chew (Aspirin) .... Take 1 tablet by mouth once a day 2)  Vytorin 10-80 Mg Tabs (Ezetimibe-simvastatin) .Marland Kitchen.. 1 tab by mouth daily. 3)  Novolog Flexpen 100 Unit/ml Soln (Insulin aspart) .... Inject 5 units 15 minutes before your lunch and dinner 4)  Norvasc 10 Mg Tabs (Amlodipine besylate) .... Take 1 tablet by mouth once a day 5)  Lantus Solostar 100 Unit/ml Soln (Insulin glargine) .... Inject 60 units subcutaneously at bedtime 6)  Pen Needles 31g X 8 Mm Misc (Insulin pen needle) .... Use to inject insulin 4x daily: lantus once daily and novolog 3x daily 7)  Freestyle Lite Test Strp (Glucose blood) .... Use to check your blood sugar 3x daily- dx code 250.00 8)  Lisinopril-hydrochlorothiazide 20-25 Mg Tabs (Lisinopril-hydrochlorothiazide) .... Take 1 tablet by mouth once a day 9)  Lancets Misc (Lancets) .... Use to check blood  sugar 3x daily- dx code 250.00 10)  Vicodin 5-500 Mg Tabs (Hydrocodone-acetaminophen) .... Take 1 tablet twice daily for shoulder pain 11)  Celexa 20 Mg Tabs (Citalopram hydrobromide) .... Take 1 tablet every day at bedtime 12)  Tramadol Hcl 50 Mg Tabs (Tramadol hcl) .Marland Kitchen.. 1 two times a day as needed pain Prescriptions: TRAMADOL HCL 50 MG TABS (TRAMADOL HCL) 1 two times a day as needed pain  #60 x 0   Entered by:   Jamie Brookes MD   Authorized by:   Denny Levy MD   Signed by:   Jamie Brookes MD on 08/12/2009   Method used:   [facility]        [facility] [phone][facility] (retail)       [address]       [place], [place]  [id number]       Ph: [phone] or [phone]       Fax: [phone]   RxID:   [id number]

## 2010-03-18 NOTE — Assessment & Plan Note (Signed)
Summary: EST-CK/FU/MEDS/CFB   Vital Signs:  Patient profile:   77 year old female Height:      64 inches (162.56 cm) Weight:      204.7 pounds (93.05 kg) BMI:     35.26 Temp:     97.3 degrees F (36.28 degrees C) oral Pulse rate:   87 / minute BP sitting:   185 / 96  (right arm)  Vitals Entered By: Stanton Kidney Ditzler RN (May 17, 2009 9:52 AM) Is Patient Diabetic? Yes Did you bring your meter with you today? No Pain Assessment Patient in pain? yes     Location: right shoulder and arm Intensity: 10 Type: hurts Onset of pain  past 2 months Nutritional Status BMI of > 30 = obese Nutritional Status Detail appetite good CBG Result 278  Have you ever been in a relationship where you felt threatened, hurt or afraid?denies   Does patient need assistance? Functional Status Self care Ambulation Impaired:Risk for fall Comments Uses a cane. Discuss right shoulder and arm pain x 2 months.   Primary Care Provider:  Mliss Sax MD   History of Present Illness: 77 yo female with uncontrolled diabetes (insulin dependent), HLD, uncontrolled HTN who presents to [facility] for regular follow up appointment and has main concern with right shoulder pain. It started about 2-3 months ago and has been getting progressively worse. She thinks it might be related to physical activity since she has been doing lots of things using that arm. She denies any particular trauma to the area and does not that she has chronic shoulder pain bilaterally. This pain is however, somewhat new. She does report that she had similar pain in her left shoulder but it eventually got better with pain meds and physical therapy. She can only move her arm in certain way and has difficulty raising arm above her shoulder, difficulty with rotation, but no problems with felxion and extension, no problems with abduction/adduction. No other concerns at the time.  She would also like to get refill on her BP meds, she reports being out of her  meds for 2 weeks.   In addition, she reports having some feeling of depression with insomnia and sad mood, loss of energy and would like to have medicine for it. She reports difficulty getting over loss of her family member. Denies SI/HI.  Depression History:      The patient denies a depressed mood most of the day and a diminished interest in her usual daily activities.  Positive alarm features for depression include insomnia, fatigue (loss of energy), feelings of worthlessness (guilt), and impaired concentration (indecisiveness).  However, she denies significant weight loss, significant weight gain, psychomotor retardation, and recurrent thoughts of death or suicide.        The patient denies that she feels like life is not worth living, denies that she wishes that she were dead, and denies that she has thought about ending her life.         Preventive Screening-Counseling & Management  Alcohol-Tobacco     Smoking Status: quit     Year Quit: 103yr     Passive Smoke Exposure: no  Caffeine-Diet-Exercise     Does Patient Exercise: no  Problems Prior to Update: 1)  Lesion, Vulva  (ICD-236.3) 2)  Tinea Corporis  (ICD-110.5) 3)  Shoulder Pain  (ICD-719.41) 4)  Lipoma, Skin  (ICD-214.1) 5)  Mourning  (ICD-309.0) 6)  Pulmonary Nodule  (ICD-518.89) 7)  ? of Prolapsed Urethral Mucosa  (ICD-599.5)  8)  Hematuria Unspecified  (ICD-599.70) 9)  Vaginitis, Atrophic, Severe  (ICD-627.3) 10)  Urinary Incontinence  (ICD-788.30) 11)  Neoplasms Unspec Nature Bone Soft Tissue&skin  (ICD-239.2) 12)  Other Symptoms Referable To Shoulder Joint  (ICD-719.61) 13)  Disruption, Old, Medial Colatr Ligament  (ICD-717.82) 14)  Diverticulitis, Hx of  (ICD-V12.79) 15)  Health Screening  (ICD-V70.0) 16)  Arthritis, Knee  (ICD-716.96) 17)  Diabetes Mellitus, Type II  (ICD-250.00) 18)  Abdominal Mass, Left Lower Quadrant  (ICD-789.34) 19)  Cerebrovascular Accident, Hx of  (ICD-V12.50) 20)  Hypertension   (ICD-401.9) 21)  Hyperlipidemia  (ICD-272.4) 22)  Gerd  (ICD-530.81)  Medications Prior to Update: 1)  Aspirin 81 Mg Chew (Aspirin) .... Take 1 Tablet By Mouth Once A Day 2)  Vytorin 10-80 Mg Tabs (Ezetimibe-Simvastatin) .Marland Kitchen.. 1 Tab By Mouth Daily. 3)  Novolog Flexpen 100 Unit/ml Soln (Insulin Aspart) .... Inject 5 Units 15 Minutes Before Your Lunch and Dinner 4)  Norvasc 10 Mg Tabs (Amlodipine Besylate) .... Take 1 Tablet By Mouth Once A Day 5)  Lantus Solostar 100 Unit/ml  Soln (Insulin Glargine) .... Inject 60 Units Subcutaneously At Bedtime 6)  Pen Needles 31g X 8 Mm  Misc (Insulin Pen Needle) .... Use To Inject Insulin 4x Daily: Lantus Once Daily and Novolog 3x Daily 7)  Freestyle Lite Test  Strp (Glucose Blood) .... Use To Check Your Blood Sugar 3x Daily- Dx Code 250.00 8)  Lisinopril-Hydrochlorothiazide 20-25 Mg Tabs (Lisinopril-Hydrochlorothiazide) .... Take 1 Tablet By Mouth Once A Day 9)  Lancets  Misc (Lancets) .... Use To Check Blood Sugar 3x Daily- Dx Code 250.00 10)  Diflucan 200 Mg Tabs (Fluconazole) .Marland Kitchen.. 1 Tab Every Third Day.take 3 Tabs. If Not Better, Take 3 More. 11)  Ciprofloxacin Hcl 500 Mg Tabs (Ciprofloxacin Hcl) .Marland Kitchen.. 1 Tab Twice A Day  Current Medications (verified): 1)  Aspirin 81 Mg Chew (Aspirin) .... Take 1 Tablet By Mouth Once A Day 2)  Vytorin 10-80 Mg Tabs (Ezetimibe-Simvastatin) .Marland Kitchen.. 1 Tab By Mouth Daily. 3)  Novolog Flexpen 100 Unit/ml Soln (Insulin Aspart) .... Inject 5 Units 15 Minutes Before Your Lunch and Dinner 4)  Norvasc 10 Mg Tabs (Amlodipine Besylate) .... Take 1 Tablet By Mouth Once A Day 5)  Lantus Solostar 100 Unit/ml  Soln (Insulin Glargine) .... Inject 60 Units Subcutaneously At Bedtime 6)  Pen Needles 31g X 8 Mm  Misc (Insulin Pen Needle) .... Use To Inject Insulin 4x Daily: Lantus Once Daily and Novolog 3x Daily 7)  Freestyle Lite Test  Strp (Glucose Blood) .... Use To Check Your Blood Sugar 3x Daily- Dx Code 250.00 8)   Lisinopril-Hydrochlorothiazide 20-25 Mg Tabs (Lisinopril-Hydrochlorothiazide) .... Take 1 Tablet By Mouth Once A Day 9)  Lancets  Misc (Lancets) .... Use To Check Blood Sugar 3x Daily- Dx Code 250.00 10)  Vicodin 5-500 Mg Tabs (Hydrocodone-Acetaminophen) .... Take 1 Tablet Twice Daily For Shoulder Pain 11)  Celexa 20 Mg Tabs (Citalopram Hydrobromide) .... Take 1 Tablet Every Day At Bedtime  Allergies (verified): No Known Drug Allergies  Past History:  Past Medical History: Last updated: 08/25/2007 Diabetes mellitus, type II - HbA1c of 8.7 in 12/07 GERD Hyperlipidemia - 245/116/39/183    7/07 Hypertension Cerebrovascular accident, hx of  2006.           Rt embolic stroke, no residual deficit ?h/o Skin lesion bil           Diff included Tinea Versicolor and Annularis Granulare  Biopsy results not in Echart. DEXA scan - 01/27/06 results of which not available. Diverticulitis, hx of  Past Surgical History: Last updated: 08/25/2007 Surgery on the right ankle for broken ankle 10 yrs back. Surgery on the left shoulder -2 yrs back. Hysterectomy - 34 yrs back.  Family History: Last updated: 03/24/2006 Mother - Does not know  Father - Infection Siblings - DM, HTN No F/H/O Cancer  Social History: Last updated: 03/24/2006 Occupation: Daycare Single Former Smoker - quit 5 yrs ago  Risk Factors: Exercise: no (05/17/2009)  Risk Factors: Smoking Status: quit (05/17/2009) Passive Smoke Exposure: no (05/17/2009)  Family History: Reviewed history from 03/24/2006 and no changes required. Mother - Does not know  Father - Infection Siblings - DM, HTN No F/H/O Cancer  Social History: Reviewed history from 03/24/2006 and no changes required. 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 Lungs are clear to auscultation, no crackles or wheezes. Heart:  Normal rate and regular rhythm. S1 and S2 normal without gallop, murmur, click, rub or other extra sounds.   Shoulder/Elbow Exam  Shoulder Exam:    Right:    Inspection:  Normal    Palpation:  Normal    Stability:  stable    Tenderness:  right AC joint    Swelling:  no    Erythema:  no    difficulty with rotation and lifting arms above shoulder.    Left:    Inspection:  Normal    Palpation:  Normal    Stability:  stable    Tenderness:  no    Swelling:  no    Erythema:  no   Impression & Recommendations:  Problem # 1:  SHOULDER PAIN, RIGHT (ICD-719.41) Differential is wide and includes frozen shoulder syndrome, rotator cuff tear, OA pain. Will get xray of the area and will follow up on the results. I have discussed this with patient and explained to her that she must use the arm as much as she can even in the setting of her pain since if she does not use it, this could become serious issue and permanent damage can result. I will give her some pain meds to help her with it and will get her to PT for exercise recommendations.  Her updated medication list for this problem includes:    Aspirin 81 Mg Chew (Aspirin) .Marland Kitchen... Take 1 tablet by mouth once a day    Vicodin 5-500 Mg Tabs (Hydrocodone-acetaminophen) .Marland Kitchen... Take 1 tablet twice daily for shoulder pain  Orders: Physical Therapy Referral (PT) Radiology other (Radiology Other)  Problem # 2:  DIABETES MELLITUS, TYPE II (ICD-250.00) Very poorly controlled and noncompliance is the main issue here. She is not taking insulin as recommended and this is the problem. I have discussed this in detail with her and she is aware of all the problem that she can experience with noncompliance. I have schedule and eye doctor appointment sicne she is due for that. Will leave the same regimen for now and I have  advised her to bring in the monitor so that we can see what her sugars are doing. I recommended that she comes in 2 weeks with her monitor and we can discuss the details of her regimen.  Her updated medication list for this problem includes:  Aspirin 81 Mg Chew (Aspirin) .Marland Kitchen... Take 1 tablet by mouth once a day    Novolog Flexpen 100 Unit/ml Soln (Insulin aspart) ..... Inject 5 units 15 minutes before your lunch and dinner    Lantus Solostar 100 Unit/ml Soln (Insulin glargine) ..... Inject 60 units subcutaneously at bedtime    Lisinopril-hydrochlorothiazide 20-25 Mg Tabs (Lisinopril-hydrochlorothiazide) .Marland Kitchen... Take 1 tablet by mouth once a day  Orders: Capillary Blood Glucose/CBG (01093) Ophthalmology Referral (Ophthalmology)  Labs Reviewed: Creat: 0.62 (03/20/2009)    Reviewed HgBA1c results: 12.1 (03/20/2009)  12.0 (11/27/2008)  Problem # 3:  HYPERTENSION (ICD-401.9) Also secondary to noncompliance and the fact that she has been out of the meds for 2 weeks does not make sense since per our records she just got refills in February. I suspect that there is a component of depression her which is why she is not compliant with her meds and I think if we help her with that maybe she will be compliant with the rest of the meds. I advised her to  check her blood pressure regularly, if it is >170 please call clinic at [phone]. Her updated medication list for this problem includes:    Norvasc 10 Mg Tabs (Amlodipine besylate) .Marland Kitchen... Take 1 tablet by mouth once a day    Lisinopril-hydrochlorothiazide 20-25 Mg Tabs (Lisinopril-hydrochlorothiazide) .Marland Kitchen... Take 1 tablet by mouth once a day  BP today: 185/96 Prior BP: 142/80 (03/20/2009)  Labs Reviewed: K+: 3.1 (03/20/2009) Creat: : 0.62 (03/20/2009)   Chol: 189 (03/20/2009)   HDL: 38 (03/20/2009)   LDL: 129 (03/20/2009)   TG: 108 (03/20/2009)  Problem # 4:  HYPERLIPIDEMIA (ICD-272.4) Improvement in LDL, will continue to monitor.  Her updated  medication list for this problem includes:    Vytorin 10-80 Mg Tabs (Ezetimibe-simvastatin) .Marland Kitchen... 1 tab by mouth daily.  Labs Reviewed: SGOT: 7 (03/20/2009)   SGPT: 8 (03/20/2009)   HDL:38 (03/20/2009), 44 (07/26/2008)  LDL:129 (03/20/2009), 175 (07/26/2008)  Chol:189 (03/20/2009), 241 (07/26/2008)  Trig:108 (03/20/2009), 111 (07/26/2008)  Problem # 5:  HEALTH SCREENING (ICD-V70.0) She is due for mammogram in this year, she has had hysterectomy so no PAP is indicated.   Complete Medication List: 1)  Aspirin 81 Mg Chew (Aspirin) .... Take 1 tablet by mouth once a day 2)  Vytorin 10-80 Mg Tabs (Ezetimibe-simvastatin) .Marland Kitchen.. 1 tab by mouth daily. 3)  Novolog Flexpen 100 Unit/ml Soln (Insulin aspart) .... Inject 5 units 15 minutes before your lunch and dinner 4)  Norvasc 10 Mg Tabs (Amlodipine besylate) .... Take 1 tablet by mouth once a day 5)  Lantus Solostar 100 Unit/ml Soln (Insulin glargine) .... Inject 60 units subcutaneously at bedtime 6)  Pen Needles 31g X 8 Mm Misc (Insulin pen needle) .... Use to inject insulin 4x daily: lantus once daily and novolog 3x daily 7)  Freestyle Lite Test Strp (Glucose blood) .... Use to check your blood sugar 3x daily- dx code 250.00 8)  Lisinopril-hydrochlorothiazide 20-25 Mg Tabs (Lisinopril-hydrochlorothiazide) .... Take 1 tablet by mouth once a day 9)  Lancets Misc (Lancets) .... Use to check blood sugar 3x daily- dx code 250.00 10)  Vicodin 5-500 Mg Tabs (Hydrocodone-acetaminophen) .... Take 1 tablet twice daily for shoulder pain 11)  Celexa 20 Mg Tabs (Citalopram hydrobromide) .... Take 1 tablet every day at bedtime  Patient Instructions: 1)  Please schedule a follow-up appointment in 2 weeks. 2)  Please check your sugar levels regularly and remember to bring the meter with you to the next clinic appointment,  if the sugars are > 350 or < 60 please call us at [phone] 3)  Please check your blood pressure regularly, if it is >170 please call clinic at  [phone] 4)  Check your feet each night for sore areas, calluses or signs of infection. Prescriptions: LISINOPRIL-HYDROCHLOROTHIAZIDE 20-25 MG TABS (LISINOPRIL-HYDROCHLOROTHIAZIDE) Take 1 tablet by mouth once a day  #30 x 5   Entered and Authorized by:   Mliss Sax MD   Signed by:   Mliss Sax MD on 05/17/2009   Method used:   [facility]ron[facility] to        [facility] [address] [phone]* (retail)       [address]       [place], [place]  [address]       Ph: [phone] or [phone]       Fax: [phone]   RxID:   [id number] LANTUS SOLOSTAR 100 UNIT/ML  SOLN (INSULIN GLARGINE) inject 60 units subcutaneously at bedtime  #1 x 3   Entered and Authorized by:   Mliss Sax MD   Signed by:   Mliss Sax MD on 05/17/2009   Method used:   [facility]        [facility]  [address] [phone]* (retail)       [address]       [place], [place]  [address]       Ph: [phone] or [phone]       Fax: [phone]   RxID:   [id number] NORVASC 10 MG TABS (AMLODIPINE BESYLATE) Take 1 tablet by mouth once a day  #31 x 11   Entered and Authorized by:   Mliss Sax MD   Signed by:   Mliss Sax MD on 05/17/2009   Method used:   [facility] to        [facility] [address] [facility] [address] [phone]* (retail)       [address]       [place], [place]  [address]       Ph: [phone] or [phone]       Fax: [phone]   RxID:   [id number] VYTORIN 10-80 MG TABS (EZETIMIBE-SIMVASTATIN) 1 tab by mouth daily.  #30 x 3   Entered and Authorized by:   Mliss Sax MD   Signed by:   Mliss Sax MD on 05/17/2009   Method used:   [facility]ronically to        [facility] [address] [phone]* (retail)       [address]       [place], [place]  [address]       Ph: [phone] or [phone]       Fax: [phone]   RxID:   [id number] ASPIRIN 81 MG CHEW (ASPIRIN) Take 1 tablet by  mouth once a day  #31 x 11   Entered and Authorized by:   Mliss Sax MD   Signed by:   Mliss Sax MD on 05/17/2009   Method used:   [facility]        [facility]  [address] [phone]* (retail)       [address]       [place], [place]  [address]       Ph: [phone] or [phone]  Fax: [phone]   RxID:   [id number] CELEXA 20 MG TABS (CITALOPRAM HYDROBROMIDE) take 1 tablet every day at bedtime  #30 x 3   Entered and Authorized by:   Mliss Sax MD   Signed by:   Mliss Sax MD on 05/17/2009   Method used:   Print then Give to Patient   RxID:   [id number] VICODIN 5-500 MG TABS (HYDROCODONE-ACETAMINOPHEN) take 1 tablet twice daily for shoulder pain  #60 x 0   Entered and Authorized by:   Mliss Sax MD   Signed by:   Mliss Sax MD on 05/17/2009   Method used:   Print then Give to Patient   RxID:   [id number]    Prevention & Chronic Care Immunizations   Influenza vaccine: Fluvax 3+  (11/27/2008)   Influenza vaccine deferral: Not indicated  (05/17/2009)    Tetanus booster: 03/20/2009: Td    Pneumococcal vaccine: Not documented   Pneumococcal vaccine deferral: Refused  (05/17/2009)    H. zoster vaccine: Not documented   H. zoster vaccine deferral: Refused  (05/17/2009)  Colorectal Screening   Hemoccult: Not documented   Hemoccult action/deferral: Refused  (05/17/2009)    Colonoscopy: Not documented   Colonoscopy action/deferral: Refused  (03/20/2009)  Other Screening   Pap smear: Specimen Adequacy: Satisfactory for evaluation.   Interpretation/Result:Negative for intraepithelial Lesion or Malignancy.     (05/31/2006)   Pap smear action/deferral: Not indicated S/P hysterectomy  (05/17/2009)   Pap smear due: 05/2007    Mammogram: No specific mammographic evidence of malignancy    (01/29/2006)   Mammogram action/deferral: Refused  (05/17/2009)   Mammogram due: 01/2007    DXA bone density scan: Not  documented   DXA bone density action/deferral: Refused  (05/17/2009)   Smoking status: quit  (05/17/2009)  Diabetes Mellitus   HgbA1C: 12.1  (03/20/2009)    Eye exam: Not documented   Diabetic eye exam action/deferral: Ophthalmology referral  (05/17/2009)    Foot exam: yes  (07/26/2008)   High risk foot: No  (07/26/2008)   Foot care education: Not documented    Urine microalbumin/creatinine ratio: 14.2  (07/26/2008)    Diabetes flowsheet reviewed?: Yes   Progress toward A1C goal: Deteriorated  Lipids   Total Cholesterol: 189  (03/20/2009)   LDL: 129  (03/20/2009)   LDL Direct: Not documented   HDL: 38  (03/20/2009)   Triglycerides: 108  (03/20/2009)    SGOT (AST): 7  (03/20/2009)   SGPT (ALT): 8  (03/20/2009)   Alkaline phosphatase: 65  (03/20/2009)   Total bilirubin: 0.5  (03/20/2009)    Lipid flowsheet reviewed?: Yes   Progress toward LDL goal: Improved  Hypertension   Last Blood Pressure: 185 / 96  (05/17/2009)   Serum creatinine: 0.62  (03/20/2009)   Serum potassium 3.1  (03/20/2009)    Hypertension flowsheet reviewed?: Yes   Progress toward BP goal: Deteriorated  Self-Management Support :   Personal Goals (by the next clinic visit) :     Personal A1C goal: 8  (03/20/2009)     Personal blood pressure goal: 130/80  (03/20/2009)     Personal LDL goal: 100  (03/20/2009)    Patient will work on the following items until the next clinic visit to reach self-care goals:     Medications and monitoring: take my medicines every day, examine my feet every day  (05/17/2009)     Eating: drink diet soda or water instead of juice or soda, eat more vegetables, use fresh or frozen vegetables,  eat foods that are low in salt, eat fruit for snacks and desserts, limit or avoid alcohol  (05/17/2009)     Activity: take a 30 minute walk every day  (05/17/2009)     Other: bring meter to every visit  (03/20/2009)    Diabetes self-management support: Resources for patients handout,  Written self-care plan, Education handout  (05/17/2009)   Diabetes care plan printed   Diabetes education handout printed   Last diabetes self-management training by diabetes educator: 12/04/2008    Hypertension self-management support: Resources for patients handout, Written self-care plan, Education handout  (05/17/2009)   Hypertension self-care plan printed.   Hypertension education handout printed    Lipid self-management support: Resources for patients handout, Written self-care plan, Education handout  (05/17/2009)   Lipid self-care plan printed.   Lipid education handout printed      Resource handout printed.   Nursing Instructions: Refer for screening diabetic eye exam (see order)

## 2010-03-18 NOTE — Consult Note (Signed)
Summary: [facility] No Show  [facility] No Show   Imported By: Florinda Marker 05/08/2009 15:03:05  _____________________________________________________________________  External Attachment:    Type:   Image     Comment:   External Document

## 2010-03-18 NOTE — Progress Notes (Signed)
Summary: acute-shoulder pain//kg  Phone Note Call from Patient Call back at [phone]   Caller: [name]-Ms [name] Call For: Mliss Sax MD Summary of Call: Received call from pt's dtr Ms [name], wants to know what to do about moms right arm pain. (shoulder to wrist).  Very upset and feels like nothing is being done to relieve the pain.  No relief with vicodin.  Did buy some liquid from a flea market that was rubbed on the arm and pt got a little relief from pain.  States she is tired of seeing her mom walk around crying from this arm pain and it's been going on for over a year.  Ms [name] was told that her mom may need to see a specialist or be referred to sports medicine if the clinic was unable to manage the pain.  She stated that her mom had a similar pain before on the left arm that wasnt helped with physical therapy and she ended up having some type of surgery on it.  Will forward message to Dr Aldine Contes and call pt back this afternoon.Cynda Familia [facility])  Jul 10, 2009 11:27 AM  Initial call taken by: Cynda Familia [facility]),  Jul 10, 2009 11:27 AM  Follow-up for Phone Call        can we see her in clinic again and re-evaluate and perhaps change the pain management.  thanks Loic Hobin Follow-up by: Mliss Sax MD,  Jul 10, 2009 1:34 PM

## 2010-03-18 NOTE — Assessment & Plan Note (Signed)
Summary: med change  Prescriptions: VYTORIN 10-40 MG TABS (EZETIMIBE-SIMVASTATIN) Take 1 tab by mouth at bedtime  #30 x 5   Entered and Authorized by:   Mliss Sax MD   Signed by:   Mliss Sax MD on 09/05/2009   Method used:   [facility]        [facility]  [address] [phone]* (retail)       [address]       [place], [place]  [id number]       Ph: [phone] or [phone]       Fax: [phone]   RxID:   [id number]

## 2010-03-18 NOTE — Assessment & Plan Note (Signed)
Summary: ACUTE-RE EVAL RT ARM PAIN/CFB(MAGICK)/CFB   Vital Signs:  Patient profile:   77 year old female Height:      64 inches (162.56 cm) Weight:      198.7 pounds (93.05 kg) BMI:     34.23 Temp:     98.6 degrees F (37.00 degrees C) oral Pulse rate:   105 / minute BP sitting:   193 / 103  (left arm) Cuff size:   regular  Vitals Entered By: Theotis Barrio NT II (Jul 12, 2009 3:59 PM) CC: CHRONIC RIGHT SHOULDER PAIN /  NEED RESULTS OF XRAY OF RIGHT SHOULDER / RE- EVALUATE RIGHT ARM PAIN/  NEED SOMETHING FOR PAIN Is Patient Diabetic? Yes Did you bring your meter with you today? No Pain Assessment Patient in pain? yes     Location: RIGHT SHOULDER  DOWN TO HAND Intensity:             10+ Type: "PAIN" Onset of pain  Chronic Nutritional Status BMI of 25 - 29 = overweight  Have you ever been in a relationship where you felt threatened, hurt or afraid?No   Does patient need assistance? Functional Status Self care Ambulation Normal Comments CHRONIC RIGHT SHOULDER PAIN / NEED RESULTS OF XRAY OF RIGHT SHOULER  / NEED SOMETHING FOR PAIN   Primary Care Provider:  Mliss Sax MD  CC:  CHRONIC RIGHT SHOULDER PAIN /  NEED RESULTS OF XRAY OF RIGHT SHOULDER / RE- EVALUATE RIGHT ARM PAIN/  NEED SOMETHING FOR PAIN.  History of Present Illness: [name] is a 77 year old Female with PMH/problems as outlined in the EMR, who presents to the [facility] with chief complaint(s) of:    1. R shoulder pain: pain bothering her for quite some time. Last seen for the same problem in Apr this year. She had an x-ray taken at that time, that revealed degenerative changes. 2. DM/HTN/HL: patient is accompanied by daughter. pt says she does all her meds herself, there has been some question about adherence to med regimens that she is on.   Preventive Screening-Counseling & Management  Alcohol-Tobacco     Smoking Status: quit     Year Quit: 11yr     Passive Smoke Exposure: no  Caffeine-Diet-Exercise   Does Patient Exercise: no  Current Medications (verified): 1)  Aspirin 81 Mg Chew (Aspirin) .... Take 1 Tablet By Mouth Once A Day 2)  Vytorin 10-80 Mg Tabs (Ezetimibe-Simvastatin) .Marland Kitchen.. 1 Tab By Mouth Daily. 3)  Novolog Flexpen 100 Unit/ml Soln (Insulin Aspart) .... Inject 5 Units 15 Minutes Before Your Lunch and Dinner 4)  Norvasc 10 Mg Tabs (Amlodipine Besylate) .... Take 1 Tablet By Mouth Once A Day 5)  Lantus Solostar 100 Unit/ml  Soln (Insulin Glargine) .... Inject 60 Units Subcutaneously At Bedtime 6)  Pen Needles 31g X 8 Mm  Misc (Insulin Pen Needle) .... Use To Inject Insulin 4x Daily: Lantus Once Daily and Novolog 3x Daily 7)  Freestyle Lite Test  Strp (Glucose Blood) .... Use To Check Your Blood Sugar 3x Daily- Dx Code 250.00 8)  Lisinopril-Hydrochlorothiazide 20-25 Mg Tabs (Lisinopril-Hydrochlorothiazide) .... Take 1 Tablet By Mouth Once A Day 9)  Lancets  Misc (Lancets) .... Use To Check Blood Sugar 3x Daily- Dx Code 250.00 10)  Vicodin 5-500 Mg Tabs (Hydrocodone-Acetaminophen) .... Take 1 Tablet Twice Daily For Shoulder Pain 11)  Celexa 20 Mg Tabs (Citalopram Hydrobromide) .... Take 1 Tablet Every Day At Bedtime  Allergies (verified): No Known Drug Allergies  Past  History:  Past Medical History: Last updated: 08/25/2007 Diabetes mellitus, type II - HbA1c of 8.7 in 12/07 GERD Hyperlipidemia - 245/116/39/183    7/07 Hypertension Cerebrovascular accident, hx of  2006.           Rt embolic stroke, no residual deficit ?h/o Skin lesion bil           Diff included Tinea Versicolor and Annularis Granulare           Biopsy results not in [place]. DEXA scan - 01/27/06 results of which not available. Diverticulitis, hx of  Past Surgical History: Last updated: 08/25/2007 Surgery on the right ankle for broken ankle 10 yrs back. Surgery on the left shoulder -2 yrs back. Hysterectomy - 34 yrs back.  Family History: Last updated: 03/24/2006 Mother - Does not know  Father -  Infection Siblings - DM, HTN No F/H/O Cancer  Social History: Last updated: 03/24/2006 Occupation: Day[employer] Single Former Smoker - quit 5 yrs ago  Risk Factors: Exercise: no (07/12/2009)  Risk Factors: Smoking Status: quit (07/12/2009) Passive Smoke Exposure: no (07/12/2009)  Review of Systems      See HPI  Physical Exam  General:  alert and well-developed.   Head:  normocephalic and atraumatic.   Eyes:  pupils equal and pupils round.   Ears:  no external deformities.   Nose:  no external deformity.   Mouth:  pharynx pink and moist.   Lungs:  normal respiratory effort and normal breath sounds.   Heart:  normal rate and regular rhythm.   Abdomen:  soft and non-tender.   Msk:  Mildly tender right shoulder.  Range of motion limited due to pain.  Pulses:  normal peripheral pulses  Extremities:  no cyanosis, clubbing or edema  Neurologic:  non-focal.  Psych:  Oriented X3 and normally interactive.     Impression & Recommendations:  Problem # 1:  SHOULDER PAIN, RIGHT (ICD-719.41) Degenerative arthritis noted on x-ray. Given persistent pain and discomfort, I wonder if she should benefit from steroid injection and physio. Will send her to sports medicine for review. Continue with as needed pain meds for now.  Her updated medication list for this problem includes:    Aspirin 81 Mg Chew (Aspirin) .Marland Kitchen... Take 1 tablet by mouth once a day    Vicodin 5-500 Mg Tabs (Hydrocodone-acetaminophen) .Marland Kitchen... Take 1 tablet twice daily for shoulder pain  Orders: Sports Medicine (Sports Med)  Problem # 2:  DIABETES MELLITUS, TYPE II (ICD-250.00) A1c: 12.1 (03/20/2009 10:00:16 AM)  MICROALB/CR: 14.2 (07/26/2008 9:28:00 PM) LDL: 129 (03/20/2009 9:34:00 PM) EYE: Results: Normal.  (12/14/2008 9:37:24 AM) FOOT: yes (07/26/2008 9:44:28 AM) WEIGHT: 34.23 (07/12/2009 3:26:49 PM)   High A1c noted. She is going to bring her meter on next visit, will need insulin dose adjustment.  Her updated  medication list for this problem includes:    Aspirin 81 Mg Chew (Aspirin) .Marland Kitchen... Take 1 tablet by mouth once a day    Novolog Flexpen 100 Unit/ml Soln (Insulin aspart) ..... Inject 5 units 15 minutes before your lunch and dinner    Lantus Solostar 100 Unit/ml Soln (Insulin glargine) ..... Inject 60 units subcutaneously at bedtime    Lisinopril-hydrochlorothiazide 20-25 Mg Tabs (Lisinopril-hydrochlorothiazide) .Marland Kitchen... Take 1 tablet by mouth once a day  Problem # 3:  HYPERTENSION (ICD-401.9) High BP noted, confirmed manually to be 178/90. Patient in pain. Won't make any changes today. Will review in two weeks.  Her updated medication list for this problem includes:    Norvasc  10 Mg Tabs (Amlodipine besylate) .Marland Kitchen... Take 1 tablet by mouth once a day    Lisinopril-hydrochlorothiazide 20-25 Mg Tabs (Lisinopril-hydrochlorothiazide) .Marland Kitchen... Take 1 tablet by mouth once a day  Problem # 4:  HYPERLIPIDEMIA (ICD-272.4) No changes made today. Continue with regular meds and life style changes.  Chol: 189 (03/20/2009 9:34:00 PM)HDL:  38 (03/20/2009 9:34:00 PM)LDL:  129 (03/20/2009 9:34:00 PM)Tri:   AST:  7 (03/20/2009 9:34:00 PM) ALT:  8 (03/20/2009 9:34:00 PM)T. Bili:  0.5 (03/20/2009 9:34:00 PM) AP:  65 (03/20/2009 9:34:00 PM)   Her updated medication list for this problem includes:    Vytorin 10-80 Mg Tabs (Ezetimibe-simvastatin) .Marland Kitchen... 1 tab by mouth daily.  Complete Medication List: 1)  Aspirin 81 Mg Chew (Aspirin) .... Take 1 tablet by mouth once a day 2)  Vytorin 10-80 Mg Tabs (Ezetimibe-simvastatin) .Marland Kitchen.. 1 tab by mouth daily. 3)  Novolog Flexpen 100 Unit/ml Soln (Insulin aspart) .... Inject 5 units 15 minutes before your lunch and dinner 4)  Norvasc 10 Mg Tabs (Amlodipine besylate) .... Take 1 tablet by mouth once a day 5)  Lantus Solostar 100 Unit/ml Soln (Insulin glargine) .... Inject 60 units subcutaneously at bedtime 6)  Pen Needles 31g X 8 Mm Misc (Insulin pen needle) .... Use to inject insulin 4x  daily: lantus once daily and novolog 3x daily 7)  Freestyle Lite Test Strp (Glucose blood) .... Use to check your blood sugar 3x daily- dx code 250.00 8)  Lisinopril-hydrochlorothiazide 20-25 Mg Tabs (Lisinopril-hydrochlorothiazide) .... Take 1 tablet by mouth once a day 9)  Lancets Misc (Lancets) .... Use to check blood sugar 3x daily- dx code 250.00 10)  Vicodin 5-500 Mg Tabs (Hydrocodone-acetaminophen) .... Take 1 tablet twice daily for shoulder pain 11)  Celexa 20 Mg Tabs (Citalopram hydrobromide) .... Take 1 tablet every day at bedtime  Patient Instructions: 1)  Please schedule a follow-up appointment in 2 weeks. 2)  Please keep up your appointment with sports medicine.  Prescriptions: VICODIN 5-500 MG TABS (HYDROCODONE-ACETAMINOPHEN) take 1 tablet twice daily for shoulder pain  #60 x 0   Entered and Authorized by:   Zara Council MD   Signed by:   Zara Council MD on 07/12/2009   Method used:   Print then Give to Patient   RxID:   [id number]    Prevention & Chronic Care Immunizations   Influenza vaccine: Fluvax 3+  (11/27/2008)   Influenza vaccine deferral: Not indicated  (05/17/2009)    Tetanus booster: 03/20/2009: Td    Pneumococcal vaccine: Not documented   Pneumococcal vaccine deferral: Refused  (05/17/2009)    H. zoster vaccine: Not documented   H. zoster vaccine deferral: Refused  (05/17/2009)  Colorectal Screening   Hemoccult: Not documented   Hemoccult action/deferral: Refused  (05/17/2009)    Colonoscopy: Not documented   Colonoscopy action/deferral: Refused  (03/20/2009)  Other Screening   Pap smear: Specimen Adequacy: Satisfactory for evaluation.   Interpretation/Result:Negative for intraepithelial Lesion or Malignancy.     (05/31/2006)   Pap smear action/deferral: Not indicated S/P hysterectomy  (05/17/2009)   Pap smear due: 05/2007    Mammogram: No specific mammographic evidence of malignancy    (01/29/2006)   Mammogram action/deferral: Refused   (05/17/2009)   Mammogram due: 01/2007    DXA bone density scan: Not documented   DXA bone density action/deferral: Refused  (05/17/2009)   Smoking status: quit  (07/12/2009)  Diabetes Mellitus   HgbA1C: 12.1  (03/20/2009)    Eye exam: Results: Normal.   (12/14/2008)  Diabetic eye exam action/deferral: Ophthalmology referral  (05/17/2009)   Eye exam due: 12/2009    Foot exam: yes  (07/26/2008)   High risk foot: No  (07/26/2008)   Foot care education: Not documented    Urine microalbumin/creatinine ratio: 14.2  (07/26/2008)    Diabetes flowsheet reviewed?: Yes   Progress toward A1C goal: Unchanged  Lipids   Total Cholesterol: 189  (03/20/2009)   LDL: 129  (03/20/2009)   LDL Direct: Not documented   HDL: 38  (03/20/2009)   Triglycerides: 108  (03/20/2009)    SGOT (AST): 7  (03/20/2009)   SGPT (ALT): 8  (03/20/2009)   Alkaline phosphatase: 65  (03/20/2009)   Total bilirubin: 0.5  (03/20/2009)    Lipid flowsheet reviewed?: Yes   Progress toward LDL goal: Unchanged  Hypertension   Last Blood Pressure: 193 / 103  (07/12/2009)   Serum creatinine: 0.62  (03/20/2009)   Serum potassium 3.1  (03/20/2009)    Hypertension flowsheet reviewed?: Yes   Progress toward BP goal: Deteriorated  Self-Management Support :   Personal Goals (by the next clinic visit) :     Personal A1C goal: 8  (03/20/2009)     Personal blood pressure goal: 130/80  (03/20/2009)     Personal LDL goal: 100  (03/20/2009)    Patient will work on the following items until the next clinic visit to reach self-care goals:     Medications and monitoring: take my medicines every day, check my blood sugar, bring all of my medications to every visit, examine my feet every day  (07/12/2009)     Eating: drink diet soda or water instead of juice or soda, eat more vegetables, use fresh or frozen vegetables, eat foods that are low in salt, eat baked foods instead of fried foods, eat fruit for snacks and desserts,  limit or avoid alcohol  (07/12/2009)     Activity: take a 30 minute walk every day  (05/17/2009)     Other: bring meter to every visit  (03/20/2009)    Diabetes self-management support: Resources for patients handout  (07/12/2009)   Last diabetes self-management training by diabetes educator: 12/04/2008    Hypertension self-management support: Resources for patients handout  (07/12/2009)    Lipid self-management support: Resources for patients handout  (07/12/2009)     Self-management comments: PATIENT WALKS TO THE MAIL BOX      Resource handout printed.   Appended Document: Orders Update    Clinical Lists Changes  Orders: Added new Service order of Est. Patient Level IV (16109) - Signed
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
## 2010-03-18 NOTE — Assessment & Plan Note (Signed)
Summary: cont rt arm pain/gg   Vital Signs:  Patient profile:   77 year old female Height:      64 inches (162.56 cm) Weight:      192.7 pounds (87.59 kg) BMI:     33.20 Temp:     97.6 degrees F (36.44 degrees C) oral Pulse rate:   92 / minute BP sitting:   166 / 90  (left arm)  Vitals Entered By: Stanton Kidney Ditzler RN (December 16, 2009 9:37 AM) CC: Depression Is Patient Diabetic? Yes Did you bring your meter with you today? No Pain Assessment Patient in pain? yes     Location: right shoulder Intensity: 8.5 Type: hurting Onset of pain  2 years Nutritional Status BMI of > 30 = obese Nutritional Status Detail appetite good  Have you ever been in a relationship where you felt threatened, hurt or afraid?denies   Does patient need assistance? Functional Status Self care Ambulation Impaired:Risk for fall Comments Daughter with pt - uses a cane. Has had pain for 2 years right shoulder - meds not helping.   Primary Care Provider:  Mliss Sax MD  CC:  Depression.  History of Present Illness: 77 yo female with PMH outline below presents to [facility] with main concern of right shoulder pain that is chronic in nature for her and is not getting better. She has tried several things for it, medications, PT, injections, but not much pain relief experienced. She would like to know what are the other options for her. Her pain is 7/10 in severity, nonradiationg, worse with movement and she can not lift her arm past shoulder level, she can not support her self on tha arm. She has had similar episode in the past with the left shoulder and has had surgery on that shoulder and now is doing well for that and she wants to know if that is possibility. She denies any fever, chills,. chest pain, no SOB, no abd or urinary concerns, no blood inurine or stool. No other systemic concerns.  She reports compliance with her diabetic and cholesterol medications and her BP medicaitons.   Depression History:  The patient denies a depressed mood most of the day and a diminished interest in her usual daily activities.  The patient denies significant weight loss, significant weight gain, insomnia, hypersomnia, psychomotor agitation, psychomotor retardation, fatigue (loss of energy), feelings of worthlessness (guilt), impaired concentration (indecisiveness), and recurrent thoughts of death or suicide.        The patient denies that she feels like life is not worth living, denies that she wishes that she were dead, and denies that she has thought about ending her life.         Preventive Screening-Counseling & Management  Alcohol-Tobacco     Smoking Status: quit     Year Quit: 19yr     Passive Smoke Exposure: no  Caffeine-Diet-Exercise     Does Patient Exercise: no  Problems Prior to Update: 1)  Shoulder Pain, Right  (ICD-719.41) 2)  Pulmonary Nodule  (ICD-518.89) 3)  Diabetes Mellitus, Type II  (ICD-250.00) 4)  Cerebrovascular Accident, Hx of  (ICD-V12.50) 5)  Hypertension  (ICD-401.9) 6)  Hyperlipidemia  (ICD-272.4) 7)  Gerd  (ICD-530.81) 8)  Lesion, Vulva  (ICD-236.3) 9)  Tinea Corporis  (ICD-110.5) 10)  Shoulder Pain  (ICD-719.41) 11)  Lipoma, Skin  (ICD-214.1) 12)  Mourning  (ICD-309.0) 13)  ? of Prolapsed Urethral Mucosa  (ICD-599.5) 14)  Hematuria Unspecified  (ICD-599.70) 15)  Vaginitis, Atrophic,  Severe  (ICD-627.3) 16)  Urinary Incontinence  (ICD-788.30) 17)  Neoplasms Unspec Nature Bone Soft Tissue&skin  (ICD-239.2) 18)  Other Symptoms Referable To Shoulder Joint  (ICD-719.61) 19)  Disruption, Old, Medial Colatr Ligament  (ICD-717.82) 20)  Diverticulitis, Hx of  (ICD-V12.79) 21)  Health Screening  (ICD-V70.0) 22)  Arthritis, Knee  (ICD-716.96) 23)  Abdominal Mass, Left Lower Quadrant  (ICD-789.34)  Medications Prior to Update: 1)  Aspirin 81 Mg Chew (Aspirin) .... Take 1 Tablet By Mouth Once A Day 2)  Vytorin 10-40 Mg Tabs (Ezetimibe-Simvastatin) .... Take 1 Tab By Mouth At  Bedtime 3)  Novolog Flexpen 100 Unit/ml Soln (Insulin Aspart) .... Inject 5 Units 15 Minutes Before Your Lunch and Dinner 4)  Norvasc 10 Mg Tabs (Amlodipine Besylate) .... Take 1 Tablet By Mouth Once A Day 5)  Lantus Solostar 100 Unit/ml  Soln (Insulin Glargine) .... Inject 60 Units Subcutaneously At Bedtime 6)  Pen Needles 31g X 8 Mm  Misc (Insulin Pen Needle) .... Use To Inject Insulin 4x Daily: Lantus Once Daily and Novolog 3x Daily 7)  Freestyle Lite Test  Strp (Glucose Blood) .... Use To Check Your Blood Sugar 3x Daily- Dx Code 250.00 8)  Lisinopril-Hydrochlorothiazide 20-25 Mg Tabs (Lisinopril-Hydrochlorothiazide) .... Take 1 Tablet By Mouth Once A Day 9)  Lancets  Misc (Lancets) .... Use To Check Blood Sugar 3x Daily- Dx Code 250.00 10)  Vicodin 5-500 Mg Tabs (Hydrocodone-Acetaminophen) .... Take 1 Tablet Twice Daily For Shoulder Pain 11)  Celexa 20 Mg Tabs (Citalopram Hydrobromide) .... Take 1 Tablet Every Day At Bedtime 12)  Tramadol Hcl 50 Mg Tabs (Tramadol Hcl) .Marland Kitchen.. 1 Two Times A Day As Needed Pain  Allergies (verified): No Known Drug Allergies  Past History:  Past Medical History: Last updated: 08/25/2007 Diabetes mellitus, type II - HbA1c of 8.7 in 12/07 GERD Hyperlipidemia - 245/116/39/183    7/07 Hypertension Cerebrovascular accident, hx of  2006.           Rt embolic stroke, no residual deficit ?h/o Skin lesion bil           Diff included Tinea Versicolor and Annularis Granulare           Biopsy results not in [place]. DEXA scan - 01/27/06 results of which not available. Diverticulitis, hx of  Past Surgical History: Last updated: 08/25/2007 Surgery on the right ankle for broken ankle 10 yrs back. Surgery on the left shoulder -2 yrs back. Hysterectomy - 34 yrs back.  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 Mother - Does not know  Father - Infection Siblings - DM, HTN No F/H/O Cancer  Social History: Reviewed history from 03/24/2006 and no changes required. 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   Heart:  normal rate and regular rhythm.    Diabetes Management Exam:    Foot Exam (with socks and/or shoes not present):       Sensory-Monofilament:  Left foot: normal          Right foot: normal   Shoulder/Elbow Exam  Sensory:    Gross sensation intact in the upper extremities.    Reflexes:    Normal reflexes in the upper extremities.    Shoulder Exam:    Right:    Inspection:  Normal    Palpation:  Abnormal       Location:  left bicipital groove    Stability:  stable    Tenderness:  right bicipital groove    Swelling:  no    Erythema:  no    limited range of motion on abduction, adduction and external or internal rotation    Left:    Inspection:  Normal    Palpation:  Normal    Stability:  stable    Tenderness:  no    Swelling:  no    Erythema:  no   Impression & Recommendations:  Problem # 1:  HYPERTENSION (ICD-401.9) Uncontrolled and most likely secondary to medical and dietary noncompliance. We have discussed this issue in detail and i have explained to patient that before we think about any surgical intervention we need to have BP under good control and we have to focus on compliance so that we can readjust the regimen as indicated. Pt has agreed to work  on this issue and will write down her daily BP and will bring it to me to next visit so that we can determine the adequacy of BP control.  Her updated medication list for this problem includes:    Norvasc 10 Mg Tabs (Amlodipine besylate) .Marland Kitchen... Take 1 tablet by mouth once a day    Lisinopril-hydrochlorothiazide 20-25 Mg Tabs (Lisinopril-hydrochlorothiazide) .Marland Kitchen... Take 1 tablet by mouth once a day  Orders: T-Comprehensive Metabolic Panel [id number]) T-Lipid Profile ([id number])  BP today: 166/90 Prior BP: 121/79 (08/12/2009)  Labs Reviewed: K+: 3.1 (03/20/2009) Creat: : 0.62 (03/20/2009)   Chol: 189 (03/20/2009)   HDL: 38 (03/20/2009)   LDL: 129 (03/20/2009)   TG: 108 (03/20/2009)  Problem # 2:  SHOULDER PAIN, RIGHT (ICD-719.41) For now, the best plan which I have discussed with patient and Dr. Onalee Hua is to cont PT and ensure that we can get diabetes and BP under control and will work on orthopedic referal, pt had already had referral to Dr. Ranell Patrick but has never made it to the appointment and when I asked her about it she did not seem to know about it. I will increase the dose of pain medicien temporarily so that she can start the therapy and work on BP and sugar control.  Her updated medication list for this problem includes:    Aspirin 81 Mg Chew (Aspirin) .Marland Kitchen... Take 1 tablet by mouth once a day    Vicodin Es 7.5-750 Mg Tabs (Hydrocodone-acetaminophen) .Marland Kitchen... Take 1 tablet 2-3 times per day as needed for pain    Tramadol Hcl 50 Mg Tabs (Tramadol hcl) .Marland Kitchen... 1 two times a day as needed pain  Orders: Physical Therapy Referral (PT)  Orders: Physical Therapy Referral (PT)  Problem # 3:  HYPERLIPIDEMIA (ICD-272.4) Will check FLP today and readjust the regimen as indicated.  Her updated medication list for this problem includes:    Vytorin 10-40 Mg Tabs (Ezetimibe-simvastatin) .Marland Kitchen... Take 1 tab by mouth at bedtime  Orders: T-Comprehensive Metabolic Panel [id number]) T-Lipid Profile  [id number])  Labs Reviewed: SGOT: 7 (03/20/2009)   SGPT: 8 (03/20/2009)   HDL:38 (03/20/2009), 44 (07/26/2008)  LDL:129 (03/20/2009), 175 (07/26/2008)  Chol:189 (03/20/2009), 241 (07/26/2008)  Trig:108 (03/20/2009), 111 (07/26/2008)  Problem # 4:  DIABETES MELLITUS, TYPE II (ICD-250.00) A1C trending down and I have congradualted to the patient and we have discussed this in more detail that before we get to think about shoulder surgeries we need to get sugar under even better control and she seemed to understand that as well as her daughter. In the meantime will work on [employer] referral.  Her updated medication list for this problem includes:    Novolog Flexpen 100 Unit/ml Soln (Insulin aspart) ..... Inject 5 units 15 minutes before your lunch and dinner    Lantus Solostar 100 Unit/ml Soln (Insulin glargine) ..... Inject 60 units subcutaneously at bedtime  Orders: T- Capillary Blood Glucose ([id number]) T-Hgb A1C (in-house) ([id number]) Podiatry Referral (Podiatry)  Labs Reviewed: Creat: 0.62 (03/20/2009)     Last Eye Exam: Results: Normal.  (12/14/2008) Reviewed HgBA1c results: 9.6 (12/16/2009)  12.1 (03/20/2009)  Complete Medication List: 1)  Aspirin 81 Mg Chew (Aspirin) .... Take 1 tablet by mouth once a day 2)  Vytorin 10-40 Mg Tabs (Ezetimibe-simvastatin) .... Take 1 tab by mouth at bedtime 3)  Novolog Flexpen 100 Unit/ml Soln (Insulin aspart) .... Inject 5 units 15 minutes before your lunch and dinner 4)  Norvasc 10 Mg Tabs (Amlodipine besylate) .... Take 1 tablet by mouth once a day 5)  Lantus Solostar 100 Unit/ml Soln (Insulin glargine) .... Inject 60 units subcutaneously at bedtime 6)  Pen Needles 31g X 8 Mm Misc (Insulin pen needle) .... Use to inject insulin 4x daily: lantus once daily and novolog 3x daily 7)  Freestyle Lite Test Strp (Glucose blood) .... Use to check your blood sugar 3x daily- dx code 250.00 8)  Lisinopril-hydrochlorothiazide 20-25 Mg Tabs  (Lisinopril-hydrochlorothiazide) .... Take 1 tablet by mouth once a day 9)  Lancets Misc (Lancets) .... Use to check blood sugar 3x daily- dx code 250.00 10)  Vicodin Es 7.5-750 Mg Tabs (Hydrocodone-acetaminophen) .... Take 1 tablet 2-3 times per day as needed for pain 11)  Celexa 20 Mg Tabs (Citalopram hydrobromide) .... Take 1 tablet every day at bedtime 12)  Tramadol Hcl 50 Mg Tabs (Tramadol hcl) .Marland Kitchen.. 1 two times a day as needed pain  Other Orders: Influenza Vaccine MCR ([id number])  Patient Instructions: 1)  Please schedule a follow-up appointment in 3 months. 2)  Please check your blood pressure regularly, if it is >170 please call clinic at [phone] 3)  Please check your sugar levels regularly and remember to bring the meter with you to the next clinic appointment, if the sugars are > 350 or < 60 please call us at [phone] Prescriptions: CELEXA 20 MG TABS (CITALOPRAM HYDROBROMIDE) take 1 tablet every day at bedtime  #30 x 3   Entered and Authorized by:   Mliss Sax MD   Signed by:   Mliss Sax MD on 12/18/2009   Method used:   Faxed to ...       [facility]  [address] [phone]* (retail)       [address]       [place], [place]  [address]       Ph: [phone] or [phone]       Fax: [phone]   RxID:   [id number] LISINOPRIL-HYDROCHLOROTHIAZIDE 20-25 MG TABS (LISINOPRIL-HYDROCHLOROTHIAZIDE) Take 1 tablet by mouth once a day  #30 x 5   Entered and Authorized by:   Mliss Sax MD   Signed by:  Mliss Sax MD on 12/18/2009   Method used:   Faxed to ...       [facility] [phone]* (retail)       [address]       [place], [place]  [id number]       Ph: [phone] or [phone]       Fax: [phone]   RxID:   [id number] LANTUS SOLOSTAR 100 UNIT/ML  SOLN (INSULIN GLARGINE) inject 60 units subcutaneously at bedtime  #1 x 3   Entered and Authorized by:   Mliss Sax MD   Signed by:   Mliss Sax  MD on 12/18/2009   Method used:   Faxed to ...       [facility]  [address] [phone]* (retail)       [address]       [place], [place]  [address]       Ph: [phone] or [phone]       Fax: [phone]   RxID:   [id number] NOVOLOG FLEXPEN 100 UNIT/ML SOLN (INSULIN ASPART) Inject 5 units 15 minutes before your lunch and dinner  #1 x 5   Entered and Authorized by:   Mliss Sax MD   Signed by:   Mliss Sax MD on 12/18/2009   Method used:   Faxed to ...       [facility] [phone]* (retail)       [address]       [place], [place]  [address]       Ph: [phone] or [phone]       Fax: [phone]   RxID:   [id number] ASPIRIN 81 MG CHEW (ASPIRIN) Take 1 tablet by mouth once a day  #31 x 11   Entered and Authorized by:   Mliss Sax MD   Signed by:   Mliss Sax MD on 12/18/2009   Method used:   Faxed to ...       [facility]  [address] [phone]* (retail)       [address]       [place], [place]  [address]       Ph: [phone] or [phone]       Fax: [phone]   RxID:   [id number] VYTORIN 10-40 MG TABS (EZETIMIBE-SIMVASTATIN) Take 1 tab by mouth at bedtime  #30 x 5   Entered and Authorized by:   Mliss Sax MD   Signed by:   Mliss Sax MD on 12/18/2009   Method used:   Faxed to ...       [facility] [phone]* (retail)       [address]       [place], [place]  [address]       Ph: [phone] or [phone]       Fax: [phone]   RxID:   [id number] VICODIN ES 7.5-750 MG TABS (HYDROCODONE-ACETAMINOPHEN) take 1 tablet 2-3 times per day as needed for pain  #60 x 0   Entered and Authorized by:   Mliss Sax MD   Signed by:   Mliss Sax MD on 12/16/2009   Method used:   Print then Give to Patient   RxID:   [id number]    Orders  Added: 1)  T- Capillary Blood Glucose [82948] 2)  T-Hgb A1C  (in-house) [83036QW] 3)  Est. Patient Level IV [30865] 4)  Influenza Vaccine MCR [00025] 5)  Physical Therapy Referral [PT] 6)  Podiatry Referral [Podiatry] 7)  T-Comprehensive Metabolic Panel [[id number]] 8)  T-Lipid Profile [[id number]]   Immunizations Administered:  Influenza Vaccine # 1:    Vaccine Type: Fluvax MCR    Site: left deltoid    Mfr: GlaxoSmithKline    Dose: 0.5 ml    Route: IM    Given by: Stanton Kidney Ditzler RN    Exp. Date: 08/16/2010    Lot #: [id number]    VIS given: 09/10/09 version given December 16, 2009.  Flu Vaccine Consent Questions:    Do you have a history of severe allergic reactions to this vaccine? no    Any prior history of allergic reactions to egg and/or gelatin? no    Do you have a sensitivity to the preservative Thimersol? no    Do you have a past history of Guillan-Barre Syndrome? no    Do you currently have an acute febrile illness? no    Have you ever had a severe reaction to latex? no    Vaccine information given and explained to patient? yes    Are you currently pregnant? no   Immunizations Administered:  Influenza Vaccine # 1:    Vaccine Type: Fluvax MCR    Site: left deltoid    Mfr: GlaxoSmithKline    Dose: 0.5 ml    Route: IM    Given by: Stanton Kidney Ditzler RN    Exp. Date: 08/16/2010    Lot #: [id number]    VIS given: 09/10/09 version given December 16, 2009. Process Orders ABN check has not been done for this order Order queued for requisitioning for [facility]: December 18, 2009 12:33 PM  Tests Sent for requisitioning (December 18, 2009 12:33 PM):     12/16/2009: [facility] -- T-Comprehensive Metabolic Panel [[id number]] (signed)     12/16/2009: [facility] -- T-Lipid Profile [id number] (signed)    Process Orders ABN check has not been done for this order Order queued for requisitioning for [facility]: December 18, 2009 12:33 PM  Tests Sent for requisitioning (December 18, 2009 12:33 PM):      12/16/2009: [facility] -- T-Comprehensive Metabolic Panel [[id number]] (signed)     12/16/2009: [facility] -- T-Lipid Profile [id number] (signed)   Laboratory Results   Blood Tests   Date/Time Received: December 16, 2009 10:06 AM  Date/Time Reported: Burke Keels  December 16, 2009 10:06 AM   HGBA1C: 9.6%   (Normal Range: Non-Diabetic - 3-6%   Control Diabetic - 6-8%) CBG Fasting:: 242mg /dL      Diabetic Foot Exam Foot Inspection Is there a history of a foot ulcer?              No Is there a foot ulcer now?              No Is there swelling or an abnormal foot shape?          No Are the toenails long?                Yes Are the toenails thick?                Yes Are the toenails ingrown?              No Is there heavy callous  build-up?              No Is there pain in the calf muscle (Intermittent claudication) when walking?    NoIs there a claw toe deformity?              No Is there elevated skin temperature?            No Is there limited ankle dorsiflexion?            No Is there foot or ankle muscle weakness?            No  Diabetic Foot Care Education Pulse Check          Right Foot          Left Foot Posterior Tibial:        normal            normal Dorsalis Pedis:        normal            normal  High Risk Feet? No   10-g (5.07) Semmes-Weinstein Monofilament Test Performed by: Mliss Sax MD          Right Foot          Left Foot Visual Inspection     normal         normal Test Control      normal         normal Site 1         normal         normal Site 2         normal         normal Site 3         normal         normal Site 4         normal         normal Site 5         normal         normal Site 6         normal         normal Site 7         normal         normal Site 8         normal         normal Site 9         normal         normal Site 10         normal         normal  Impression      normal          normal  Legend:  Site 1 = Plantar aspect of first toe (center of pad) Site 2 = Plantar aspect of third toe (center of pad) Site 3 = Plantar aspect of fifth toe (center of pad) Site 4 = Plantar aspect of first metatarsal head Site 5 = Plantar aspect of third metatarsal head Site 6 = Plantar aspect of fifth metatarsal head Site 7 = Plantar aspect of medial midfoot Site 8 = Plantar aspect of lateral midfoot Site 9 = Plantar aspect of heel Site 10 = dorsal aspect of foot between the base of the first and second toes   Result is Abnormal if patient was unable to perceive the monofilament at site indicated.    Prevention & Chronic Care Immunizations   Influenza vaccine: Fluvax MCR  (12/16/2009)  Influenza vaccine deferral: Not indicated  (05/17/2009)    Tetanus booster: 03/20/2009: Td    Pneumococcal vaccine: Not documented   Pneumococcal vaccine deferral: Refused  (05/17/2009)    H. zoster vaccine: Not documented   H. zoster vaccine deferral: Refused  (05/17/2009)  Colorectal Screening   Hemoccult: Not documented   Hemoccult action/deferral: Refused  (05/17/2009)    Colonoscopy: Not documented   Colonoscopy action/deferral: Refused  (03/20/2009)  Other Screening   Pap smear: Specimen Adequacy: Satisfactory for evaluation.   Interpretation/Result:Negative for intraepithelial Lesion or Malignancy.     (05/31/2006)   Pap smear action/deferral: Not indicated S/P hysterectomy  (05/17/2009)   Pap smear due: 05/2007    Mammogram: No specific mammographic evidence of malignancy    (01/29/2006)   Mammogram action/deferral: Refused  (05/17/2009)   Mammogram due: 01/2007    DXA bone density scan: Not documented   DXA bone density action/deferral: Refused  (05/17/2009)   Smoking status: quit  (12/16/2009)  Diabetes Mellitus   HgbA1C: 9.6  (12/16/2009)    Eye exam: Results: Normal.   (12/14/2008)   Diabetic eye exam action/deferral: Ophthalmology referral  (05/17/2009)    Eye exam due: 12/2009    Foot exam: yes  (12/16/2009)   Foot exam action/deferral: Do today   High risk foot: No  (12/16/2009)   Foot care education: Not documented    Urine microalbumin/creatinine ratio: 14.2  (07/26/2008)    Diabetes flowsheet reviewed?: Yes   Progress toward A1C goal: Improved  Lipids   Total Cholesterol: 189  (03/20/2009)   LDL: 129  (03/20/2009)   LDL Direct: Not documented   HDL: 38  (03/20/2009)   Triglycerides: 108  (03/20/2009)    SGOT (AST): 7  (03/20/2009)   SGPT (ALT): 8  (03/20/2009) CMP ordered    Alkaline phosphatase: 65  (03/20/2009)   Total bilirubin: 0.5  (03/20/2009)    Lipid flowsheet reviewed?: Yes   Progress toward LDL goal: Improved  Hypertension   Last Blood Pressure: 166 / 90  (12/16/2009)   Serum creatinine: 0.62  (03/20/2009)   Serum potassium 3.1  (03/20/2009) CMP ordered     Hypertension flowsheet reviewed?: Yes   Progress toward BP goal: Deteriorated  Self-Management Support :   Personal Goals (by the next clinic visit) :     Personal A1C goal: 8  (03/20/2009)     Personal blood pressure goal: 130/80  (03/20/2009)     Personal LDL goal: 100  (03/20/2009)    Patient will work on the following items until the next clinic visit to reach self-care goals:     Medications and monitoring: take my medicines every day, check my blood sugar, check my blood pressure, examine my feet every day  (12/16/2009)     Eating: eat more vegetables, use fresh or frozen vegetables, eat fruit for snacks and desserts, limit or avoid alcohol  (12/16/2009)     Activity: take a 30 minute walk every day  (12/16/2009)     Other: bring meter to every visit  (03/20/2009)    Diabetes self-management support: Written self-care plan, Education handout, Resources for patients handout  (12/16/2009)   Diabetes care plan printed   Diabetes education handout printed   Last diabetes self-management training by diabetes educator: 12/04/2008    Hypertension  self-management support: Written self-care plan, Education handout, Resources for patients handout  (12/16/2009)   Hypertension self-care plan printed.   Hypertension education handout printed    Lipid self-management support: Written self-care plan, Education handout,  Resources for patients handout  (12/16/2009)   Lipid self-care plan printed.   Lipid education handout printed      Resource handout printed.   Nursing Instructions: Diabetic foot exam today      Last LDL:                                                 129 (03/20/2009 9:34:00 PM)          Diabetic Foot Exam Pulse Check          Right Foot          Left Foot Posterior Tibial:        normal            normal Dorsalis Pedis:        normal            normal Comments: Dr Aldine Contes wants to do own foot exams.  Debra Ditzler RN High Risk Feet? No   10-g (5.07) Semmes-Weinstein Monofilament Test Performed by: Mliss Sax MD          Right Foot          Left Foot Visual Inspection     normal         normal

## 2010-03-18 NOTE — Progress Notes (Signed)
Summary: refill/gg  Phone Note Refill Request  on October 08, 2009 4:59 PM  Refills Requested: Medication #1:  VICODIN 5-500 MG TABS take 1 tablet twice daily for shoulder pain Pt # [phone]   Method Requested: Telephone to Pharmacy Initial call taken by: Merrie Roof RN,  October 08, 2009 4:59 PM  Follow-up for Phone Call        Rx called to pharmacy Follow-up by: Mliss Sax MD,  October 09, 2009 4:00 PM    Prescriptions: VICODIN 5-500 MG TABS (HYDROCODONE-ACETAMINOPHEN) take 1 tablet twice daily for shoulder pain  #60 x 5   Entered and Authorized by:   Mliss Sax MD   Signed by:   Mliss Sax MD on 10/09/2009   Method used:   Telephoned to ...       [facility]  [address] [phone]* (retail)       [address]       [place], [place]  [id number]       Ph: [phone] or [phone]       Fax: [phone]   RxID:   [id number]

## 2010-03-19 ENCOUNTER — Ambulatory Visit: Admit: 2010-03-19 | Payer: Self-pay

## 2010-03-19 ENCOUNTER — Ambulatory Visit: Payer: Self-pay | Admitting: Internal Medicine

## 2010-03-20 NOTE — Progress Notes (Signed)
Summary: phone/gg    Phone Note Call from Patient   Caller: Daughter Summary of Call: Call from pt's daughter:  pt c/o only able to eat small bites of food.  When  she eats feels like something is in throat.  This usually happens when she is swollowing food.  Onset last week. No known cause.  Denies sore throat, SOB.  Is NOT in distress. Pt # [id number] Initial call taken by: Merrie Roof RN,  February 20, 2010 10:44 AM  Follow-up for Phone Call        This wasn't a complaint at last month's appt. She needs appt bc might need endoscopy. Follow-up by: Blanch Media MD,  February 20, 2010 11:27 AM  Additional Follow-up for Phone Call Additional follow up Details #1::        Will see today Additional Follow-up by: Merrie Roof RN,  February 20, 2010 12:34 PM

## 2010-03-20 NOTE — Progress Notes (Signed)
Summary: Re: potential simvastatin/amlodipine interaction  Phone Note From Pharmacy   [facility]  [address] [phone]* Summary of Call: Clinic received a faxed Drug Interaction Monograph warning about potential interaction between simvastatin (in Vytorin) and amlodipine, with a risk of elevated levels of simvastatin.  Please have patient hold Vytorin until patient's PCP Dr. Aldine Contes returns next week, and then she can consider the alternatives for lipid management. Initial call taken by: Margarito Liner MD,  February 04, 2010 11:28 AM  Follow-up for Phone Call        Pt. was called and instructed to stopped taking Vytorin per Dr. Meredith Pel until PCP returns next week.  [facility]. Follow-up by: Chinita Pester RN,  February 04, 2010 11:45 AM

## 2010-03-20 NOTE — Assessment & Plan Note (Signed)
Summary: eating problem/gg   Vital Signs:  Patient profile:   77 year old female Height:      64 inches (162.56 cm) Weight:      178.0 pounds (82.82 kg) BMI:     31.39 Temp:     97.1 degrees F (36.17 degrees C) oral Pulse rate:   97 / minute BP sitting:   142 / 86  (right arm) Cuff size:   regular  Vitals Entered By: Theotis Barrio NT II (February 20, 2010 3:46 PM) CC: FEELS LIKE SOMETHING IN THROAT-STARTED ABOUE CHRISTMAS. - / WEIGHT LOSS Is Patient Diabetic? Yes Did you bring your meter with you today? No Pain Assessment Patient in pain? yes     Location: RIGHT ARM Intensity:      2 Type: CULL Onset of pain  Chronic Nutritional Status BMI of > 30 = obese CBG Result 217  Have you ever been in a relationship where you felt threatened, hurt or afraid?No   Does patient need assistance? Functional Status Self care Ambulation Normal   Primary Care Provider:  Mliss Sax MD  CC:  FEELS LIKE SOMETHING IN THROAT-STARTED ABOUE CHRISTMAS. - / WEIGHT LOSS.  History of Present Illness: Pt is a 77 y/o F with PMH outlined in the EMR who presents today for an acute c/o:  1. Inability to eat: she occasionally feels nauseated with eating and feeling like her food won't pass down her throat.  She has not regurgitated any food and denies vomiting.  She is able to take 1-2 bites of food before her symptoms begin.   SHe is able to drink fluids without difficulty.  She admits to intermittent moderated epigastric pain that is without precipitating, worsening, or alleviating factors.  She reports 2 days of diarrhea arounds New Years day but states this is resolved.  She denies hematemesis, BRBPR, dark black stool, constipaiton, c/p, sob, syncope, change in bowel habits, pain with swallowing, ordifficultly swallowing.  She states it feels like food "gets stuck."  SHe admits to occasional heartburn but states her current symptoms are different from her usual heartburn symptoms.  Pt has lost1 4  lbs since 11/2009 and approximately 30lb since 05/2009; weight loss in not intentional.  Preventive Screening-Counseling & Management  Alcohol-Tobacco     Smoking Status: quit     Year Quit: 5yr     Passive Smoke Exposure: no  Caffeine-Diet-Exercise     Does Patient Exercise: no  Current Medications (verified): 1)  Aspirin 81 Mg Chew (Aspirin) .... Take 1 Tablet By Mouth Once A Day 2)  Pravastatin Sodium 20 Mg Tabs (Pravastatin Sodium) .... Take 1 Tab By Mouth At Bedtime 3)  Novolog Flexpen 100 Unit/ml Soln (Insulin Aspart) .... Inject 5 Units 15 Minutes Before Your Lunch and Dinner 4)  Norvasc 10 Mg Tabs (Amlodipine Besylate) .... Take 1 Tablet By Mouth Once A Day 5)  Lantus Solostar 100 Unit/ml  Soln (Insulin Glargine) .... Inject 65 Units Subcutaneously At Bedtime 6)  Pen Needles 31g X 8 Mm  Misc (Insulin Pen Needle) .... Use To Inject Insulin 4x Daily: Lantus Once Daily and Novolog 3x Daily 7)  Freestyle Lite Test  Strp (Glucose Blood) .... Use To Check Your Blood Sugar 3x Daily- Dx Code 250.00 8)  Lisinopril-Hydrochlorothiazide 20-25 Mg Tabs (Lisinopril-Hydrochlorothiazide) .... Take 1 Tablet By Mouth Once A Day 9)  Lancets  Misc (Lancets) .... Use To Check Blood Sugar 3x Daily- Dx Code 250.00 10)  Percocet 7.5-500 Mg Tabs (Oxycodone-Acetaminophen) .Marland KitchenMarland KitchenMarland Kitchen  Take 1 Tablet By Mouth Three Times A Day As Needed For Pain 11)  Celexa 20 Mg Tabs (Citalopram Hydrobromide) .... Take 1 Tablet Every Day At Bedtime  Allergies (verified): No Known Drug Allergies  Past History:  Past medical, surgical, family and social histories (including risk factors) reviewed for relevance to current acute and chronic problems.  Past Medical History: Reviewed history from 08/25/2007 and no changes required. Diabetes mellitus, type II - HbA1c of 8.7 in 12/07 GERD Hyperlipidemia - 245/116/39/183    7/07 Hypertension Cerebrovascular accident, hx of  2006.           Rt embolic stroke, no residual  deficit ?h/o Skin lesion bil           Diff included Tinea Versicolor and Annularis Granulare           Biopsy results not in [place]. DEXA scan - 01/27/06 results of which not available. Diverticulitis, hx of  Past Surgical History: Reviewed history from 08/25/2007 and no changes required. Surgery on the right ankle for broken ankle 10 yrs back. Surgery on the left shoulder -2 yrs back. Hysterectomy - 34 yrs back.  Family History: Reviewed history from 03/24/2006 and no changes required. Mother - Does not know  Father - Infection Siblings - DM, HTN No F/H/O Cancer  Social History: Reviewed history from 03/24/2006 and no changes required. Occupation: Day[employer] Single Former Smoker - quit 5 yrs ago  Physical Exam  General:  Well-developed,well-nourished,in no acute distress; alert,appropriate and cooperative throughout examination Head:  normocephalic and atraumatic.   Eyes:  pupils equal and pupils round.  EOMI.  Left eye does not open as much as right 2/2 surgery; this is unchanged from pts baseline.  Sclerae anicteric.  Conjunctivae without pallor or injection Mouth:  pharynx pink and moist.  no erythema and no exudates.   Neck:  supple, full ROM, and no masses.   Lungs:  normal respiratory effort and normal breath sounds.   Heart:  normal rate and regular rhythm.  no murmur, no gallop, and no rub.   Abdomen:  soft, non-tender, no distention, no masses, no guarding, no rigidity, no rebound tenderness, no hepatomegaly, and no splenomegaly.  BS slightly hypoactive. Rectal:  no external abnormalities, no hemorrhoids, normal sphincter tone, no masses, no tenderness, no fissures, no fistulae, and no perianal rash.  FOBT negative. Extremities:  no cyanosis, clubbing or edema  Neurologic:  alert & oriented X3, cranial nerves II-XII intact, strength normal in all extremities, sensation intact to light touch, gait normal, and DTRs symmetrical and normal.   Skin:  turgor normal, color  normal, no rashes, and no suspicious lesions.   Psych:  Oriented X3, memory intact for recent and remote, normally interactive, good eye contact, not anxious appearing, and not depressed appearing.     Impression & Recommendations:  Problem # 1:  DYSKINESIA OF ESOPHAGUS (ICD-530.5) I am concerned that pts dysphagia may represent underlying malignancy given her age, symptoms of esophageal dysphagia and unintentional weight loss.   Will refer her to GI for EGD to evaluate her dysphagia; she may also benefit from colonscopy for w/u of her weight loss.  Advised pt to ensure she maintains adequate p.o. intake by taking ensure three times a day and eating pureed foods, i/.e applesauce, mashed potatoes, etc.  Instucted pt to rtc or to go to the ER if she is unable to tolerated pureed foods or fluids.  Orders: Hemoccult Guaiac-1 spec.(in office) (04540) Gastroenterology Referral (GI) T-CBC w/Diff [phone])  T-CMP with Estimated GFR ([address]) T-TSH ([id number])  Problem # 2:  WEIGHT LOSS, ABNORMAL (ICD-783.21) I am concerned about pts unintentional weight loss, particularly in context with her new c/o dysphagia.  Please refer to problem #1 for plan re: dysphagia.  Will check cbc w/ diff, CMET, TSH for evaluation of her weight loss.  Rectal exam performed in office; no abnormalities on exam, FOBT negative.  WIll refer pt to GI for evaluation of her dysphagia with EGD as well as possible colonoscopy for w/u of her weight loss.  Problem # 3:  DIABETES MELLITUS, TYPE II (ICD-250.00) A1c slightly improved but still well above goal of 7.0.  Advised pt to rtc for office visit to discuss management of her DM2; advised pt to check blood sugars regularly and to bring her meter with her to her next appt. Her updated medication list for this problem includes:    Aspirin 81 Mg Chew (Aspirin) .Marland Kitchen... Take 1 tablet by mouth once a day    Novolog Flexpen 100 Unit/ml Soln (Insulin aspart) ..... Inject 5 units 15  minutes before your lunch and dinner    Lantus Solostar 100 Unit/ml Soln (Insulin glargine) ..... Inject 65 units subcutaneously at bedtime    Lisinopril-hydrochlorothiazide 20-25 Mg Tabs (Lisinopril-hydrochlorothiazide) .Marland Kitchen... Take 1 tablet by mouth once a day  Orders: T- Capillary Blood Glucose (82956) T-Hgb A1C (in-house) (21308MV)  Labs Reviewed: Creat: 0.60 (12/16/2009)     Last Eye Exam: Results: Normal.  (12/14/2008) Reviewed HgBA1c results: 9.2 (02/20/2010)  9.6 (12/16/2009)  >33min with 50% face to face time spent couseling pt and coordinating care with other providers.  Complete Medication List: 1)  Aspirin 81 Mg Chew (Aspirin) .... Take 1 tablet by mouth once a day 2)  Pravastatin Sodium 20 Mg Tabs (Pravastatin sodium) .... Take 1 tab by mouth at bedtime 3)  Novolog Flexpen 100 Unit/ml Soln (Insulin aspart) .... Inject 5 units 15 minutes before your lunch and dinner 4)  Norvasc 10 Mg Tabs (Amlodipine besylate) .... Take 1 tablet by mouth once a day 5)  Lantus Solostar 100 Unit/ml Soln (Insulin glargine) .... Inject 65 units subcutaneously at bedtime 6)  Pen Needles 31g X 8 Mm Misc (Insulin pen needle) .... Use to inject insulin 4x daily: lantus once daily and novolog 3x daily 7)  Freestyle Lite Test Strp (Glucose blood) .... Use to check your blood sugar 3x daily- dx code 250.00 8)  Lisinopril-hydrochlorothiazide 20-25 Mg Tabs (Lisinopril-hydrochlorothiazide) .... Take 1 tablet by mouth once a day 9)  Lancets Misc (Lancets) .... Use to check blood sugar 3x daily- dx code 250.00 10)  Percocet 7.5-500 Mg Tabs (Oxycodone-acetaminophen) .... Take 1 tablet by mouth three times a day as needed for pain 11)  Celexa 20 Mg Tabs (Citalopram hydrobromide) .... Take 1 tablet every day at bedtime  Patient Instructions: 1)  Please schedule a follow-up appointment in 1 month with your primary care doctor. 2)  Return to clinic on the morning of 02/20/10 for blood work. 3)  We will call you  if your lab work is not normal. 4)  We will refer you to a stomach doctor (gastroenterologist) for further evaluation of your difficulty swallowing. 5)  Be sure to drink plenty of water and eat pureed foods like applesauce. 6)  Drink Ensure or glucerna three times a day. 7)  Call the clinic or go to the ER if you develop any chest pain, difficulty breathing, inability to swallow fluids, excessive vomiting, or other concerning symptoms.  Orders Added: 1)  T- Capillary Blood Glucose [82948] 2)  T-Hgb A1C (in-house) [83036QW] 3)  Hemoccult Guaiac-1 spec.(in office) [82270] 4)  Gastroenterology Referral [GI] 5)  T-CBC w/Diff [[id number]] 6)  T-CMP with Estimated GFR [[id number]] 7)  T-TSH [[id number]] 8)  Est. Patient Level IV [29562]   Process Orders Check Orders Results:     [facility]: Check successful Tests Sent for requisitioning (February 25, 2010 8:59 AM):     02/20/2010: [facility] -- T-CBC w/Diff [[id number]] (signed)     02/20/2010: [facility] -- T-CMP with Estimated GFR [[id number]] (signed)     02/20/2010: [facility] -- T-TSH [phone] (signed)     Laboratory Results   Blood Tests   Date/Time Received: February 20, 2010 4:23 PM Date/Time Reported: Alric Quan  February 20, 2010 4:23 PM   HGBA1C: 9.2%   (Normal Range: Non-Diabetic - 3-6%   Control Diabetic - 6-8%) CBG Random:: 217mg /dL

## 2010-03-20 NOTE — Letter (Signed)
Summary: Historic Patient File  Historic Patient File   Imported By: Shon Hough 02/28/2010 12:04:38  _____________________________________________________________________  External Attachment:    Type:   Image     Comment:   External Document

## 2010-03-20 NOTE — Medication Information (Signed)
Summary: DRUG INTERACTION   DRUG INTERACTION   Imported By: Margie Billet 02/05/2010 11:25:01  _____________________________________________________________________  External Attachment:    Type:   Image     Comment:   External Document

## 2010-03-20 NOTE — Miscellaneous (Signed)
Summary: REHAB INITIAL SUMMARY  REHAB INITIAL SUMMARY   Imported By: Shon Hough 02/28/2010 11:50:30  _____________________________________________________________________  External Attachment:    Type:   Image     Comment:   External Document

## 2010-03-20 NOTE — Letter (Signed)
Summary: [facility] MEDICAL DIABETIC SUPPLIES  [facility] MEDICAL DIABETIC SUPPLIES   Imported By: Shon Hough 02/28/2010 14:30:06  _____________________________________________________________________  External Attachment:    Type:   Image     Comment:   External Document

## 2010-03-20 NOTE — Assessment & Plan Note (Signed)
Summary: checkup, eval for diab shoes/pcp-magick/hla   Vital Signs:  Patient profile:   77 year old female Height:      64 inches (162.56 cm) Weight:      182.2 pounds (82.82 kg) BMI:     31.39 Temp:     97.2 degrees F (36.22 degrees C) oral Pulse rate:   96 / minute BP sitting:   140 / 82  (left arm)  Vitals Entered By: Stanton Kidney Ditzler RN (February 12, 2010 9:50 AM) Is Patient Diabetic? Yes Did you bring your meter with you today? No Pain Assessment Patient in pain? yes     Location: right shoulder and arm Intensity: 7 Type: aching Onset of pain  past 2 years Nutritional Status BMI of > 30 = obese Nutritional Status Detail appetite good CBG Result 219  Have you ever been in a relationship where you felt threatened, hurt or afraid?denies   Does patient need assistance? Functional Status Self care Ambulation Impaired:Risk for fall Comments Uses a cane. Daughter and grand daughter with pt. Ck-up.   Primary Care Provider:  Mliss Sax MD   History of Present Illness: 77 yo female with PMH outlined below presents to [facility] for regular follow up. She was told to come in to discuss interaction between her medications for cholesterol and BP. She was told to hold of on taking her cholesterol medication until we see her in clinic. In addition, she would like to discuss her further evaluation for right shoulder pain. This has been cont chronic sharp pain with no specific alleviating factors and as a result she has very limited range of motion in her right upper extremity. She denies any fever, chills, systemic symptoms.  Reports compliance with her BP, diabetic medications.   Depression History:      The patient denies a depressed mood most of the day and a diminished interest in her usual daily activities.  The patient denies significant weight loss, significant weight gain, insomnia, hypersomnia, psychomotor agitation, psychomotor retardation, fatigue (loss of energy), feelings of  worthlessness (guilt), impaired concentration (indecisiveness), and recurrent thoughts of death or suicide.        The patient denies that she feels like life is not worth living, denies that she wishes that she were dead, and denies that she has thought about ending her life.         Preventive Screening-Counseling & Management  Alcohol-Tobacco     Smoking Status: quit     Year Quit: 29yr     Passive Smoke Exposure: no  Caffeine-Diet-Exercise     Does Patient Exercise: no  Problems Prior to Update: 1)  Shoulder Pain, Right  (ICD-719.41) 2)  Pulmonary Nodule  (ICD-518.89) 3)  Diabetes Mellitus, Type II  (ICD-250.00) 4)  Cerebrovascular Accident, Hx of  (ICD-V12.50) 5)  Hypertension  (ICD-401.9) 6)  Hyperlipidemia  (ICD-272.4) 7)  Gerd  (ICD-530.81) 8)  Lesion, Vulva  (ICD-236.3) 9)  Tinea Corporis  (ICD-110.5) 10)  Shoulder Pain  (ICD-719.41) 11)  Lipoma, Skin  (ICD-214.1) 12)  Mourning  (ICD-309.0) 13)  ? of Prolapsed Urethral Mucosa  (ICD-599.5) 14)  Hematuria Unspecified  (ICD-599.70) 15)  Vaginitis, Atrophic, Severe  (ICD-627.3) 16)  Urinary Incontinence  (ICD-788.30) 17)  Neoplasms Unspec Nature Bone Soft Tissue&skin  (ICD-239.2) 18)  Other Symptoms Referable To Shoulder Joint  (ICD-719.61) 19)  Disruption, Old, Medial Colatr Ligament  (ICD-717.82) 20)  Diverticulitis, Hx of  (ICD-V12.79) 21)  Health Screening  (ICD-V70.0) 22)  Arthritis, Knee  (  ICD-716.96) 23)  Abdominal Mass, Left Lower Quadrant  (ICD-789.34)  Medications Prior to Update: 1)  Aspirin 81 Mg Chew (Aspirin) .... Take 1 Tablet By Mouth Once A Day 2)  Vytorin 10-40 Mg Tabs (Ezetimibe-Simvastatin) .... Take 1 Tab By Mouth At Bedtime 3)  Novolog Flexpen 100 Unit/ml Soln (Insulin Aspart) .... Inject 5 Units 15 Minutes Before Your Lunch and Dinner 4)  Norvasc 10 Mg Tabs (Amlodipine Besylate) .... Take 1 Tablet By Mouth Once A Day 5)  Lantus Solostar 100 Unit/ml  Soln (Insulin Glargine) .... Inject 60 Units  Subcutaneously At Bedtime 6)  Pen Needles 31g X 8 Mm  Misc (Insulin Pen Needle) .... Use To Inject Insulin 4x Daily: Lantus Once Daily and Novolog 3x Daily 7)  Freestyle Lite Test  Strp (Glucose Blood) .... Use To Check Your Blood Sugar 3x Daily- Dx Code 250.00 8)  Lisinopril-Hydrochlorothiazide 20-25 Mg Tabs (Lisinopril-Hydrochlorothiazide) .... Take 1 Tablet By Mouth Once A Day 9)  Lancets  Misc (Lancets) .... Use To Check Blood Sugar 3x Daily- Dx Code 250.00 10)  Vicodin Es 7.5-750 Mg Tabs (Hydrocodone-Acetaminophen) .... Take 1 Tablet 2-3 Times Per Day As Needed For Pain 11)  Celexa 20 Mg Tabs (Citalopram Hydrobromide) .... Take 1 Tablet Every Day At Bedtime 12)  Tramadol Hcl 50 Mg Tabs (Tramadol Hcl) .Marland Kitchen.. 1 Two Times A Day As Needed Pain  Current Medications (verified): 1)  Aspirin 81 Mg Chew (Aspirin) .... Take 1 Tablet By Mouth Once A Day 2)  Vytorin 10-40 Mg Tabs (Ezetimibe-Simvastatin) .... Take 1 Tab By Mouth At Bedtime 3)  Novolog Flexpen 100 Unit/ml Soln (Insulin Aspart) .... Inject 5 Units 15 Minutes Before Your Lunch and Dinner 4)  Norvasc 10 Mg Tabs (Amlodipine Besylate) .... Take 1 Tablet By Mouth Once A Day 5)  Lantus Solostar 100 Unit/ml  Soln (Insulin Glargine) .... Inject 60 Units Subcutaneously At Bedtime 6)  Pen Needles 31g X 8 Mm  Misc (Insulin Pen Needle) .... Use To Inject Insulin 4x Daily: Lantus Once Daily and Novolog 3x Daily 7)  Freestyle Lite Test  Strp (Glucose Blood) .... Use To Check Your Blood Sugar 3x Daily- Dx Code 250.00 8)  Lisinopril-Hydrochlorothiazide 20-25 Mg Tabs (Lisinopril-Hydrochlorothiazide) .... Take 1 Tablet By Mouth Once A Day 9)  Lancets  Misc (Lancets) .... Use To Check Blood Sugar 3x Daily- Dx Code 250.00 10)  Vicodin Es 7.5-750 Mg Tabs (Hydrocodone-Acetaminophen) .... Take 1 Tablet 2-3 Times Per Day As Needed For Pain 11)  Celexa 20 Mg Tabs (Citalopram Hydrobromide) .... Take 1 Tablet Every Day At Bedtime 12)  Tramadol Hcl 50 Mg Tabs  (Tramadol Hcl) .Marland Kitchen.. 1 Two Times A Day As Needed Pain  Allergies (verified): No Known Drug Allergies  Past History:  Past Medical History: Last updated: 08/25/2007 Diabetes mellitus, type II - HbA1c of 8.7 in 12/07 GERD Hyperlipidemia - 245/116/39/183    7/07 Hypertension Cerebrovascular accident, hx of  2006.           Rt embolic stroke, no residual deficit ?h/o Skin lesion bil           Diff included Tinea Versicolor and Annularis Granulare           Biopsy results not in [facility]. DEXA scan - 01/27/06 results of which not available. Diverticulitis, hx of  Past Surgical History: Last updated: 08/25/2007 Surgery on the right ankle for broken ankle 10 yrs back. Surgery on the left shoulder -2 yrs back. Hysterectomy - 34 yrs back.  Family  History: Last updated: 03/24/2006 Mother - Does not know  Father - Infection Siblings - DM, HTN No F/H/O Cancer  Social History: Last updated: 03/24/2006 Occupation: Daycare Single Former Smoker - quit 5 yrs ago  Risk Factors: Exercise: no (02/12/2010)  Risk Factors: Smoking Status: quit (02/12/2010) Passive Smoke Exposure: no (02/12/2010)  Family History: Reviewed history from 03/24/2006 and no changes required. Mother - Does not know  Father - Infection Siblings - DM, HTN No F/H/O Cancer  Social History: Reviewed history from 03/24/2006 and no changes required. 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   Heart:  normal rate and regular rhythm.   Msk:  Rigt ABduction to 110 degrees, forward flexion to 90 degrees.  Psych:  Oriented X3 and normally interactive.     Impression & Recommendations:  Problem # 1:  SHOULDER PAIN, RIGHT (ICD-719.41) Assessment  Unchanged Continuous pain, I have scheduled an appointment with Dr. Ranell Patrick on  January 18th, 10 am and I will follow upon recommendations. She tells me that current pain regimen is not working so I will temporarily give her percocet and will d/c the vicodin and tramadol and will follow up on Dr. Ranell Patrick rec's. I will not have her sign pain contract yet but will address issue on next visit if the med helps or not.  The following medications were removed from the medication list:    Tramadol Hcl 50 Mg Tabs (Tramadol hcl) .Marland Kitchen... 1 two times a day as needed pain Her updated medication list for this problem includes:    Aspirin 81 Mg Chew (Aspirin) .Marland Kitchen... Take 1 tablet by mouth once a day    Percocet 7.5-500 Mg Tabs (Oxycodone-acetaminophen) .Marland Kitchen... Take 1 tablet by mouth three times a day as needed for pain  Problem # 2:  DIABETES MELLITUS, TYPE II (ICD-250.00)  Today CBG in 200, she reports compliance. I will increase the lantus to 65 units. I have discussed with her the importance of strict glucose control in case she needs surgery. On her next visit we will check A1C and will readjust the regimen as indicated.  Her updated medication list for this problem includes:    Novolog Flexpen 100 Unit/ml Soln (Insulin aspart) ..... Inject 5 units 15 minutes before your lunch and dinner    Lantus Solostar 100 Unit/ml Soln (Insulin glargine) ..... Inject 65 units subcutaneously at bedtime    Lisinopril-hydrochlorothiazide 20-25 Mg Tabs (Lisinopril-hydrochlorothiazide) .Marland Kitchen... Take 1 tablet by mouth once a day  Labs Reviewed: Creat: 0.60 (12/16/2009)     Last Eye Exam: Results: Normal.  (12/14/2008) Reviewed HgBA1c results: 9.6 (12/16/2009)  12.1 (03/20/2009)  Orders: Capillary Blood Glucose/CBG (45409)  Problem # 3:  HYPERTENSION (ICD-401.9) Improved, will cont the same regimen.  Her updated medication list for this problem includes:    Norvasc 10 Mg Tabs (Amlodipine besylate) .Marland Kitchen... Take 1 tablet by mouth  once a day    Lisinopril-hydrochlorothiazide 20-25 Mg Tabs (Lisinopril-hydrochlorothiazide) .Marland Kitchen... Take 1 tablet by mouth once a day  BP today: 140/82 Prior BP: 166/90 (12/16/2009)  Labs Reviewed: K+: 3.5 (12/16/2009) Creat: : 0.60 (12/16/2009)   Chol: 179 (12/16/2009)   HDL: 38 (12/16/2009)   LDL: 120 (12/16/2009)   TG: 104 (12/16/2009)  Problem # 4:  HYPERLIPIDEMIA (ICD-272.4) Will stop vytorrin due to  its interaction with amlodipine. Will start low dose pravastatin and willl readjust the regimen as indicated.  Her updated medication list for this problem includes:    Pravastatin Sodium 20 Mg Tabs (Pravastatin sodium) .Marland Kitchen... Take 1 tab by mouth at bedtime  Complete Medication List: 1)  Aspirin 81 Mg Chew (Aspirin) .... Take 1 tablet by mouth once a day 2)  Pravastatin Sodium 20 Mg Tabs (Pravastatin sodium) .... Take 1 tab by mouth at bedtime 3)  Novolog Flexpen 100 Unit/ml Soln (Insulin aspart) .... Inject 5 units 15 minutes before your lunch and dinner 4)  Norvasc 10 Mg Tabs (Amlodipine besylate) .... Take 1 tablet by mouth once a day 5)  Lantus Solostar 100 Unit/ml Soln (Insulin glargine) .... Inject 65 units subcutaneously at bedtime 6)  Pen Needles 31g X 8 Mm Misc (Insulin pen needle) .... Use to inject insulin 4x daily: lantus once daily and novolog 3x daily 7)  Freestyle Lite Test Strp (Glucose blood) .... Use to check your blood sugar 3x daily- dx code 250.00 8)  Lisinopril-hydrochlorothiazide 20-25 Mg Tabs (Lisinopril-hydrochlorothiazide) .... Take 1 tablet by mouth once a day 9)  Lancets Misc (Lancets) .... Use to check blood sugar 3x daily- dx code 250.00 10)  Percocet 7.5-500 Mg Tabs (Oxycodone-acetaminophen) .... Take 1 tablet by mouth three times a day as needed for pain 11)  Celexa 20 Mg Tabs (Citalopram hydrobromide) .... Take 1 tablet every day at bedtime  Patient Instructions: 1)  Please schedule a follow-up appointment in 1 month. 2)  Please check your blood pressure  regularly, if it is >170 please call clinic at [phone] 3)  Please check your sugar levels regularly and remember to bring the meter with you to the next clinic appointment, if the sugars are > 350 or < 60 please call us at [phone] 4)  YOU HAVE APPOINTMENT WITH DR. Ranell Patrick ON JANUARY 18TH, 2012 AT 10 AM! 5)  YOUR INSULIN DOSE WAS INCREASED TO 65 UNITS PER DAY.  Prescriptions: LANTUS SOLOSTAR 100 UNIT/ML  SOLN (INSULIN GLARGINE) inject 65 units subcutaneously at bedtime  #1 x 3   Entered and Authorized by:   Mliss Sax MD   Signed by:   Mliss Sax MD on 02/12/2010   Method used:   Print then Give to Patient   RxID:   [id number] PRAVASTATIN SODIUM 20 MG TABS (PRAVASTATIN SODIUM) Take 1 tab by mouth at bedtime  #30 x 3   Entered and Authorized by:   Mliss Sax MD   Signed by:   Mliss Sax MD on 02/12/2010   Method used:   Print then Give to Patient   RxID:   [id number] PERCOCET 7.5-500 MG TABS (OXYCODONE-ACETAMINOPHEN) Take 1 tablet by mouth three times a day as needed for pain  #50 x 0   Entered and Authorized by:   Mliss Sax MD   Signed by:   Mliss Sax MD on 02/12/2010   Method used:   Print then Give to Patient   RxID:   [id number]    Orders Added: 1)  Capillary Blood Glucose/CBG [82948] 2)  Est. Patient Level III [95638]     Prevention & Chronic Care Immunizations   Influenza vaccine: Fluvax MCR  (12/16/2009)   Influenza vaccine deferral: Not indicated  (05/17/2009)    Tetanus booster: 03/20/2009: Td    Pneumococcal vaccine: Not documented   Pneumococcal vaccine deferral: Refused  (05/17/2009)    H. zoster vaccine: Not documented   H. zoster vaccine deferral: Refused  (  05/17/2009)  Colorectal Screening   Hemoccult: Not documented   Hemoccult action/deferral: Refused  (05/17/2009)    Colonoscopy: Not documented   Colonoscopy action/deferral: Refused  (03/20/2009)  Other Screening   Pap smear: Specimen Adequacy: Satisfactory for  evaluation.   Interpretation/Result:Negative for intraepithelial Lesion or Malignancy.     (05/31/2006)   Pap smear action/deferral: Not indicated S/P hysterectomy  (05/17/2009)   Pap smear due: 05/2007    Mammogram: No specific mammographic evidence of malignancy    (01/29/2006)   Mammogram action/deferral: Refused  (05/17/2009)   Mammogram due: 01/2007    DXA bone density scan: Not documented   DXA bone density action/deferral: Refused  (05/17/2009)   Smoking status: quit  (02/12/2010)  Diabetes Mellitus   HgbA1C: 9.6  (12/16/2009)    Eye exam: Results: Normal.   (12/14/2008)   Diabetic eye exam action/deferral: Ophthalmology referral  (05/17/2009)   Eye exam due: 12/2009    Foot exam: yes  (12/16/2009)   Foot exam action/deferral: Do today   High risk foot: No  (12/16/2009)   Foot care education: Not documented    Urine microalbumin/creatinine ratio: 14.2  (07/26/2008)    Diabetes flowsheet reviewed?: Yes   Progress toward A1C goal: Unchanged  Lipids   Total Cholesterol: 179  (12/16/2009)   LDL: 120  (12/16/2009)   LDL Direct: Not documented   HDL: 38  (12/16/2009)   Triglycerides: 104  (12/16/2009)    SGOT (AST): 11  (12/16/2009)   SGPT (ALT): 9  (12/16/2009)   Alkaline phosphatase: 57  (12/16/2009)   Total bilirubin: 0.4  (12/16/2009)  Hypertension   Last Blood Pressure: 140 / 82  (02/12/2010)   Serum creatinine: 0.60  (12/16/2009)   Serum potassium 3.5  (12/16/2009)    Hypertension flowsheet reviewed?: Yes   Progress toward BP goal: Improved  Self-Management Support :   Personal Goals (by the next clinic visit) :     Personal A1C goal: 8  (03/20/2009)     Personal blood pressure goal: 130/80  (03/20/2009)     Personal LDL goal: 100  (03/20/2009)    Patient will work on the following items until the next clinic visit to reach self-care goals:     Medications and monitoring: take my medicines every day, check my blood sugar, examine my feet every day   (02/12/2010)     Eating: drink diet soda or water instead of juice or soda, eat more vegetables, use fresh or frozen vegetables, eat fruit for snacks and desserts, limit or avoid alcohol  (02/12/2010)     Activity: take a 30 minute walk every day  (02/12/2010)     Other: bring meter to every visit  (03/20/2009)    Diabetes self-management support: Written self-care plan, Education handout, Resources for patients handout  (02/12/2010)   Diabetes care plan printed   Diabetes education handout printed   Last diabetes self-management training by diabetes educator: 12/04/2008    Hypertension self-management support: Written self-care plan, Education handout, Resources for patients handout  (02/12/2010)   Hypertension self-care plan printed.   Hypertension education handout printed    Lipid self-management support: Written self-care plan, Education handout, Resources for patients handout  (02/12/2010)   Lipid self-care plan printed.   Lipid education handout printed      Resource handout printed.  Last LDL:  120 (12/16/2009 7:54:00 PM)      Diabetic Foot Exam Comments: Dr Aldine Contes does own foot exam. Debra Ditzler RN

## 2010-03-20 NOTE — Miscellaneous (Signed)
[facility]   Imported By: Margie Billet 02/21/2010 11:40:16  _____________________________________________________________________  External Attachment:    Type:   Image     Comment:   External Document

## 2010-03-20 NOTE — Progress Notes (Signed)
Summary: refill/gg  Phone Note Refill Request  on January 24, 2010 4:33 PM  Refills Requested: Medication #1:  VICODIN ES 7.5-750 MG TABS take 1 tablet 2-3 times per day as needed for pain   Last Refilled: 12/16/2009  Method Requested: Fax to Local Pharmacy Initial call taken by: Merrie Roof RN,  January 24, 2010 4:33 PM  Follow-up for Phone Call        Refill approved-nurse to complete Follow-up by: Mliss Sax MD,  January 29, 2010 8:36 PM    Prescriptions: VICODIN ES 7.5-750 MG TABS (HYDROCODONE-ACETAMINOPHEN) take 1 tablet 2-3 times per day as needed for pain  #60 x 0   Entered and Authorized by:   Mliss Sax MD   Signed by:   Mliss Sax MD on 01/29/2010   Method used:   Historical   RxID:   [id number]

## 2010-03-20 NOTE — Letter (Addendum)
Summary: New Patient letterJarold Motto pt)  [facility]  [address] [place], [place] [address]   Phone: [phone]  Fax: [phone]       03/06/2010 MRN: [id number]  [name] [address] [place], [place]  [address]  Dear [name],  Welcome to the Gastroenterology Division at [facility].    You are scheduled to see Dr.  Arlyce Dice on 04-15-10 at 1:30pm on the 3rd floor at [facility], [address]  We ask that you try to arrive at our office 15 minutes prior to your appointment time to allow for check-in.  We would like you to complete the enclosed self-administered evaluation form prior to your visit and bring it with you on the day of your appointment.  We will review it with you.  Also, please bring a complete list of all your medications or, if you prefer, bring the medication bottles and we will list them.  Please bring your insurance card so that we may make a copy of it.  If your insurance requires a referral to see a specialist, please bring your referral form from your primary care physician.  Co-payments are due at the time of your visit and may be paid by cash, check or credit card.     Your office visit will consist of a consult with your physician (includes a physical exam), any laboratory testing he/she may order, scheduling of any necessary diagnostic testing (e.g. x-ray, ultrasound, CT-scan), and scheduling of a procedure (e.g. Endoscopy, Colonoscopy) if required.  Please allow enough time on your schedule to allow for any/all of these possibilities.    If you cannot keep your appointment, please call [phone] to cancel or reschedule prior to your appointment date.  This allows [place] the opportunity to schedule an appointment for another patient in need of care.  If you do not cancel or reschedule by 5 p.m. the business day prior to your appointment date, you will be charged a $50.00 late cancellation/no-show fee.    Thank you for  choosing [facility] for your medical needs.  We appreciate the opportunity to care for you.  Please visit [place] at our website  to learn more about our practice.                     Sincerely,                                                             The Gastroenterology Division

## 2010-04-06 ENCOUNTER — Encounter: Payer: Self-pay | Admitting: Internal Medicine

## 2010-04-08 ENCOUNTER — Other Ambulatory Visit: Payer: Self-pay | Admitting: Family Medicine

## 2010-04-10 ENCOUNTER — Encounter: Payer: Self-pay | Admitting: Gastroenterology

## 2010-04-10 ENCOUNTER — Telehealth: Payer: Self-pay | Admitting: Gastroenterology

## 2010-04-15 ENCOUNTER — Ambulatory Visit (INDEPENDENT_AMBULATORY_CARE_PROVIDER_SITE_OTHER): Payer: Medicaid Other | Admitting: Gastroenterology

## 2010-04-15 ENCOUNTER — Encounter: Payer: Self-pay | Admitting: Gastroenterology

## 2010-04-15 ENCOUNTER — Ambulatory Visit: Payer: Self-pay | Admitting: Gastroenterology

## 2010-04-15 ENCOUNTER — Encounter (INDEPENDENT_AMBULATORY_CARE_PROVIDER_SITE_OTHER): Payer: Self-pay | Admitting: *Deleted

## 2010-04-15 ENCOUNTER — Other Ambulatory Visit: Payer: Medicare Other

## 2010-04-15 ENCOUNTER — Other Ambulatory Visit: Payer: Self-pay | Admitting: Gastroenterology

## 2010-04-15 DIAGNOSIS — R1319 Other dysphagia: Secondary | ICD-10-CM

## 2010-04-15 DIAGNOSIS — K219 Gastro-esophageal reflux disease without esophagitis: Secondary | ICD-10-CM

## 2010-04-15 DIAGNOSIS — R1310 Dysphagia, unspecified: Secondary | ICD-10-CM

## 2010-04-15 DIAGNOSIS — E119 Type 2 diabetes mellitus without complications: Secondary | ICD-10-CM

## 2010-04-15 DIAGNOSIS — Z79899 Other long term (current) drug therapy: Secondary | ICD-10-CM

## 2010-04-15 LAB — IBC PANEL
Iron: 55 ug/dL (ref 42–145)
Saturation Ratios: 17.6 % — ABNORMAL LOW (ref 20.0–50.0)
Transferrin: 223.4 mg/dL (ref 212.0–360.0)

## 2010-04-15 LAB — CBC WITH DIFFERENTIAL/PLATELET
Basophils Absolute: 0 K/uL (ref 0.0–0.1)
Basophils Relative: 0.4 % (ref 0.0–3.0)
Eosinophils Absolute: 0 K/uL (ref 0.0–0.7)
Eosinophils Relative: 0.5 % (ref 0.0–5.0)
HCT: 35.6 % — ABNORMAL LOW (ref 36.0–46.0)
Hemoglobin: 11.9 g/dL — ABNORMAL LOW (ref 12.0–15.0)
Lymphocytes Relative: 43.1 % (ref 12.0–46.0)
Lymphs Abs: 2.3 K/uL (ref 0.7–4.0)
MCHC: 33.4 g/dL (ref 30.0–36.0)
MCV: 78.8 fl (ref 78.0–100.0)
Monocytes Absolute: 0.3 K/uL (ref 0.1–1.0)
Monocytes Relative: 6.1 % (ref 3.0–12.0)
Neutro Abs: 2.6 K/uL (ref 1.4–7.7)
Neutrophils Relative %: 49.9 % (ref 43.0–77.0)
Platelets: 301 K/uL (ref 150.0–400.0)
RBC: 4.52 Mil/uL (ref 3.87–5.11)
RDW: 16.7 % — ABNORMAL HIGH (ref 11.5–14.6)
WBC: 5.3 K/uL (ref 4.5–10.5)

## 2010-04-15 LAB — VITAMIN B12: Vitamin B-12: 271 pg/mL (ref 211–911)

## 2010-04-15 LAB — FOLATE: Folate: 7.8 ng/mL

## 2010-04-15 LAB — HEMOGLOBIN A1C: Hgb A1c MFr Bld: 10 % — ABNORMAL HIGH (ref 4.6–6.5)

## 2010-04-15 NOTE — Progress Notes (Signed)
Summary: Triage  Phone Note Call from Patient Call back at Home Phone [phone]   Caller: Patient Call For: Dr. Jarold Motto Reason for Call: Talk to Nurse Summary of Call: Pt. had an appt. w/Dr. Russella Dar. She has hx w/Dr. Jarold Motto. Needs to be sch'd for DYSKENISIA OF ESOPHOGUS. Initial call taken by: Karna Christmas,  April 10, 2010 9:27 AM  Follow-up for Phone Call        Per [facility]I, patient had EGD/COLON on 10/02/2002 by Dr Jarold Motto. Patient r/s will Dr Jarold Motto for 04/15/10. Chart requested. Follow-up by: Graciella Freer RN,  April 10, 2010 3:53 PM

## 2010-04-15 NOTE — Letter (Signed)
Summary: Appt Reminder 2  [facility]  [address] [place], [place] [address]   Phone: [phone]  Fax: [phone]        April 10, 2010 MRN: [id number]    [name] [address] [place], [place]  [address]    Dear [name],   You have an appointment with Dr. Jarold Motto on February 28,2012 at 1100 am.  Please remember to bring a complete list of the medicines you are taking, your insurance card and your co-pay.  If you have to cancel or reschedule this appointment, please call before 5:00 pm the evening before to avoid a cancellation fee.  If you have any questions or concerns, please call [phone].    Sincerely,    Graciella Freer RN

## 2010-04-24 NOTE — Assessment & Plan Note (Signed)
Summary: Trouble Swallowing, EGD/COLON in 2004   History of Present Illness Visit Type: Initial Consult Primary GI MD: Sheryn Bison MD FACP FAGA Primary Provider: Blanch Media, MD Requesting Provider: Blanch Media, MD Chief Complaint: Patient c/o several weeks dysphagia to solid foods. Patient c/o midsternal chest discomfort. There is no nausea or vomiting. History of Present Illness:   very pleasant 77 year old who presents with 2 months of dysphagia for solid foods and a red throat pharyngeal area. She has chronic acid reflux but is not on PPI therapy. Last endoscopic exam was several years ago which was performed because of the guaiac positive stool. She does give symptoms of chronic regurgitation, burning substernal chest pain, and progressive swallowing difficulties. She has not had barium studies or manometry exams. Associated with this problem is a mild weight loss.  Patient is diabetic and is on insulin 3 times a day. She also is a heavy smoker up to 2 packs a day but discontinued this several years ago. She does have mild COPD, hyperlipidemia, hypertension, and history of diverticulosis. Her appetite remains fairly good, and she denies any hepatobiliary or lower gastrointestinal problems. She relates that she has repeated her stool cards which were guaiac negative.   GI Review of Systems    Reports belching, chest pain, and  dysphagia with solids.      Denies abdominal pain, acid reflux, bloating, dysphagia with liquids, heartburn, loss of appetite, nausea, vomiting, vomiting blood, weight loss, and  weight gain.        Denies anal fissure, black tarry stools, change in bowel habit, constipation, diarrhea, diverticulosis, fecal incontinence, heme positive stool, hemorrhoids, irritable bowel syndrome, jaundice, light color stool, liver problems, rectal bleeding, and  rectal pain. Preventive Screening-Counseling & Management  Caffeine-Diet-Exercise     Does Patient  Exercise: no      Drug Use:  no.      Current Medications (verified): 1)  Aspirin 81 Mg Chew (Aspirin) .... Take 1 Tablet By Mouth Once A Day 2)  Pravastatin Sodium 20 Mg Tabs (Pravastatin Sodium) .... Take 1 Tab By Mouth At Bedtime 3)  Novolog Flexpen 100 Unit/ml Soln (Insulin Aspart) .... Inject 5 Units 15 Minutes Before Your Lunch and Dinner 4)  Norvasc 10 Mg Tabs (Amlodipine Besylate) .... Take 1 Tablet By Mouth Once A Day 5)  Lantus Solostar 100 Unit/ml  Soln (Insulin Glargine) .... Inject 65 Units Subcutaneously At Bedtime 6)  Pen Needles 31g X 8 Mm  Misc (Insulin Pen Needle) .... Use To Inject Insulin 4x Daily: Lantus Once Daily and Novolog 3x Daily 7)  Freestyle Lite Test  Strp (Glucose Blood) .... Use To Check Your Blood Sugar 3x Daily- Dx Code 250.00 8)  Lisinopril-Hydrochlorothiazide 20-25 Mg Tabs (Lisinopril-Hydrochlorothiazide) .... Take 1 Tablet By Mouth Once A Day 9)  Lancets  Misc (Lancets) .... Use To Check Blood Sugar 3x Daily- Dx Code 250.00 10)  Percocet 7.5-500 Mg Tabs (Oxycodone-Acetaminophen) .... Take 1 Tablet By Mouth Three Times A Day As Needed For Pain 11)  Celexa 20 Mg Tabs (Citalopram Hydrobromide) .... Take 1 Tablet Every Day At Bedtime  Allergies (verified): No Known Drug Allergies  Past History:  Past medical, surgical, family and social histories (including risk factors) reviewed for relevance to current acute and chronic problems.  Past Medical History: Reviewed history from 08/25/2007 and no changes required. Diabetes mellitus, type II - HbA1c of 8.7 in 12/07 GERD Hyperlipidemia - 245/116/39/183    7/07 Hypertension Cerebrovascular accident, hx of  2006.  Rt embolic stroke, no residual deficit ?h/o Skin lesion bil           Diff included Tinea Versicolor and Annularis Granulare           Biopsy results not in [place]. DEXA scan - 01/27/06 results of which not available. Diverticulitis, hx of  Past Surgical History: Reviewed history  from 08/25/2007 and no changes required. Surgery on the right ankle for broken ankle 10 yrs back. Surgery on the left shoulder -2 yrs back. Hysterectomy - 34 yrs back.  Family History: Reviewed history from 03/24/2006 and no changes required. Mother - Does not know  Father - Infection Siblings - DM, HTN No F/H/O Cancer Family History of Colon Cancer: Brother Family History of Diabetes: 2 Sisters  Social History: Reviewed history from 03/24/2006 and no changes required. Occupation: Day[employer] Single Former Smoker - quit 5 yrs ago Alcohol Use - no Illicit Drug Use - no Patient does not get regular exercise.   Review of Systems  The patient denies allergy/sinus, anemia, anxiety-new, arthritis/joint pain, back pain, blood in urine, breast changes/lumps, change in vision, confusion, cough, coughing up blood, depression-new, fainting, fatigue, fever, headaches-new, hearing problems, heart murmur, heart rhythm changes, itching, menstrual pain, muscle pains/cramps, night sweats, nosebleeds, pregnancy symptoms, shortness of breath, skin rash, sleeping problems, sore throat, swelling of feet/legs, swollen lymph glands, thirst - excessive , urination - excessive , urination changes/pain, urine leakage, vision changes, and voice change.    Vital Signs:  Patient profile:   77 year old female Height:      64 inches Weight:      176.50 pounds BMI:     30.41 BSA:     1.86 Pulse rate:   84 / minute Pulse rhythm:   regular BP sitting:   140 / 84  (right arm)  Vitals Entered By: Lamona Curl CMA Duncan Dull) (April 15, 2010 11:14 AM)  Physical Exam  General:  Well developed, well nourished, no acute distress. Head:  Normocephalic and atraumatic. Eyes:  PERRLA, no icterus.exam deferred to patient's ophthalmologist.   Mouth:  No deformity or lesions, dentition normal. Neck:  Supple; no masses or thyromegaly. Lungs:  Clear throughout to auscultation. Heart:  Regular rate and rhythm; no  murmurs, rubs,  or bruits. Abdomen:  Soft, nontender and nondistended. No masses, hepatosplenomegaly or hernias noted. Normal bowel sounds. Msk:  Symmetrical with no gross deformities. Normal posture. Extremities:  No clubbing, cyanosis, edema or deformities noted. Neurologic:  Alert and  oriented x4;  grossly normal neurologically. Cervical Nodes:  No significant cervical adenopathy. Psych:  Alert and cooperative. Normal mood and affect.   Impression & Recommendations:  Problem # 1:  OTHER DYSPHAGIA (ICD-787.29) Assessment Deteriorated Probable acid reflux with secondary stricturing of her esophagus. We need to exclude esophageal malignancy, candida esophagitis, or motility disorders. I have scheduled endoscopy with adjustment in her diabetic medications. I have started Dexilant 60 mg a day with standard antireflux maneuvers and a stage IV dysphagia diet.She may need esophageal manometry and 24-hour pH probe testing.CBC and anemia profile ordered. Orders: TLB-CBC Platelet - w/Differential (85025-CBCD) TLB-B12, Serum-Total ONLY (04540-J81) TLB-Ferritin (82728-FER) TLB-Folic Acid (Folate) (82746-FOL) TLB-IBC Pnl (Iron/FE;Transferrin) (83550-IBC) EGD (EGD)  Problem # 2:  WEIGHT LOSS, ABNORMAL (ICD-783.21) Assessment: Unchanged related to #1.  Problem # 3:  DIABETES MELLITUS, TYPE II (ICD-250.00) Assessment: Improved  continue all medications per Dr. Cyndi Bender her primary care physician. Adjustments will be made for her endoscopic procedure.  Orders: T-Hgb A1C (in-house) (19147WG)  Problem #  4:  HYPERTENSION (ICD-401.9) Assessment: Unchanged blood pressure today 140/84 and pulse was 84 and regular. She is to continue her antihypertensives as per primary care  Patient Instructions: 1)  Copy sent to : Blanch Media, MD and Dr, Cyndi Bender 2)  Your procedure has been scheduled for 05/07/2010, please follow the seperate instructions.  3)  [facility] Patient Information Guide  given to patient.  4)  Upper Endoscopy brochure given.  5)  Dysphagia diet provided.  6)  The medication list was reviewed and reconciled.  All changed / newly prescribed medications were explained.  A complete medication list was provided to the patient / caregiver. Prescriptions: DEXILANT 60 MG CPDR (DEXLANSOPRAZOLE) take one by mouth once daily  #30 x 6   Entered by:   Harlow Mares CMA (AAMA)   Authorized by:   Mardella Layman MD [facility]   Signed by:   Harlow Mares CMA (AAMA) on 04/15/2010   Method used:   Electronically to        [facility]  [address] [phone]* (retail)       [address]       [place], [place]  [id number]       Ph: [phone] or [phone]       Fax: [phone]   RxID:   [id number]   Appended Document: Orders Update    Clinical Lists Changes  Orders: Added new Test order of TLB-A1C / Hgb A1C (Glycohemoglobin) (83036-A1C) - Signed

## 2010-04-24 NOTE — Letter (Signed)
Summary: Diabetic Instructions  [facility]  [address] [place], [place] [address]   Phone: [phone]  Fax: [phone]    [name] [birth year] MRN: [id number]    X   INSULIN (SHORT ACTING) MEDICATION INSTRUCTIONS (Regular, Humulog, Novolog)   The day before your procedure:   Do not take your evening dose   The day of your procedure:   Do not take your morning dose

## 2010-04-24 NOTE — Procedures (Signed)
Summary: Endoscopy   EGD  Procedure date:  10/02/2002  Findings:      Location: [facility]   Patient Name: [name], [name] MRN:  Procedure Procedures: Panendoscopy (EGD) CPT: 43235.    with biopsy(s)/brushing(s). CPT: D1846139.  Personnel: Endoscopist: Vania Rea. Jarold Motto, MD.  Exam Location: Exam performed in Outpatient Clinic. Outpatient  Patient Consent: Procedure, Alternatives, Risks and Benefits discussed, consent obtained, from patient. Consent was obtained by the RN.  Indications  Evaluation of: Positive fecal occult blood test  Symptoms: Reflux symptoms  History  Pre-Exam Physical: Performed Oct 02, 2002  Cardio-pulmonary exam, Abdominal exam, Extremity exam, Mental status exam WNL.  Exam Exam Info: Maximum depth of insertion Duodenum, intended Duodenum. Patient position: on left side. Duration of exam: 15 minutes. Vocal cords visualized. Gastric retroflexion performed. Images taken. ASA Classification: II. Tolerance: excellent.  Sedation Meds: Patient assessed and found to be appropriate for moderate (conscious) sedation. Cetacaine Spray 1 sprays given aerosolized.  Monitoring: BP and pulse monitoring done. Oximetry used. Supplemental O2 given at 2 Liters.  Instrument(s): PFC 160L. Serial [id number].   Findings - OTHER FINDING: in Distal Esophagus. Biopsy/Other Finding taken. Comments: Irregular Z-line.Marland KitchenMarland KitchenR/O Barrett's mucosa.  - Normal: Fundus to Duodenal 2nd Portion. Not Seen: Ulcer. Mucosal abnormality. AVM's. Varices.  - HIATAL HERNIA: Prolapsing, 3 cms. in length. ICD9: Hernia, Hiatal: 553.3.  Assessment  Diagnoses: 553.3: Hernia, Hiatal. Chronic GERd.Marland KitchenMarland KitchenR/O Barrett's mucosa.   Events  Unplanned Intervention: No unplanned interventions were required.  Plans Medication(s): Await pathology. PPI: Aciphex 20 mg QAM, starting Oct 02, 2002 for 4 wks.   Patient Education: Patient given standard instructions for: Reflux.    Disposition: After procedure patient sent to recovery. After recovery patient sent home.  Scheduling: Clinic Visit, to Vania Rea. Jarold Motto, MD, around Nov 02, 2002.   Comments: Will repeat hemoccults in 2 weeks as outpatient.   CC: Margarito Liner, MD  This report was created from the original endoscopy report, which was reviewed and signed by the above listed endoscopist.

## 2010-04-24 NOTE — Procedures (Signed)
Summary: Colonoscopy   Colonoscopy  Procedure date:  10/02/2002  Findings:      Location:  [facility].   Patient Name: [name], [name] MRN:  Procedure Procedures: Colonoscopy CPT: [address]109.  Personnel: Endoscopist: Vania Rea. Jarold Motto, MD.  Exam Location: Exam performed in Outpatient Clinic. Outpatient  Patient Consent: Procedure, Alternatives, Risks and Benefits discussed, consent obtained, from patient. Consent was obtained by the RN.  Indications  Evaluation of: Positive fecal occult blood test  History  Pre-Exam Physical: Performed Oct 02, 2002. Cardio-pulmonary exam, Rectal exam, Abdominal exam, Extremity exam, Mental status exam WNL.  Exam Exam: Extent of exam reached: Cecum, extent intended: Cecum.  The cecum was identified by appendiceal orifice and IC valve. Patient position: on left side. Duration of exam: 20 minutes. Colon retroflexion performed. Images taken. ASA Classification: II. Tolerance: excellent.  Monitoring: Pulse and BP monitoring, Oximetry used. Supplemental O2 given. at 2 Liters.  Colon Prep Used Golytely for colon prep. Prep results: fair, adequate exam.  Sedation Meds: Patient assessed and found to be appropriate for moderate (conscious) sedation. Fentanyl 50 mcg. given IV. Versed 5 mg. given IV.  Instrument(s): CF 140L. Serial [id number].  Findings - OTHER FINDING: Ascending Colon. Comments: Soft yellow submucosal lipoma noted.  - DIVERTICULOSIS: Cecum to Rectum. Not bleeding. ICD9: Diverticulosis, Colon: 562.10. Comments: Pancolonic diverticulosis noted.   Assessment Normal examination.  Diagnoses: 562.10: Diverticulosis, Colon.   Comments: Right colon lipoma noted. Events  Unplanned Interventions: No intervention was required.  Plans Medication Plan: Continue current medications.  Patient Education: Patient given standard instructions for: Diverticulosis.  Disposition: After procedure patient sent to  recovery. After recovery patient sent home.  Scheduling/Referral: EGD, to [facility]. Jarold Motto, MD, on Oct 02, 2002.

## 2010-04-24 NOTE — Letter (Signed)
Summary: EGD Instructions  [facility]  [address] [place], [place] [address]   Phone: [phone]  Fax: [phone]       MACI EICKHOLT    [birth year]    MRN: [id number]       Procedure Day Dorna Bloom: Wednesday 05/07/2010     Arrival Time:  8:00am     Procedure Time: 9:00am     Location of Procedure:                    [facility] (4th Floor)  PREPARATION FOR ENDOSCOPY   On 05/07/2010 THE DAY OF THE PROCEDURE:  1.   No solid foods, milk or milk products are allowed after midnight the night before your procedure.  2.   Do not drink anything colored red or purple.  Avoid juices with pulp.  No orange juice.  3.  You may drink clear liquids until 7:00am, which is 2 hours before your procedure.                                                                                                CLEAR LIQUIDS INCLUDE: Water Jello Ice Popsicles Tea (sugar ok, no milk/cream) Powdered fruit flavored drinks Coffee (sugar ok, no milk/cream) Gatorade Juice: apple, white grape, white cranberry  Lemonade Clear bullion, consomm, broth Carbonated beverages (any kind) Strained chicken noodle soup Hard Candy   MEDICATION INSTRUCTIONS  Unless otherwise instructed, you should take regular prescription medications with a small sip of water as early as possible the morning of your procedure.  Diabetic patients - see separate instructions.               OTHER INSTRUCTIONS  You will need a responsible adult at least 75 years of age to accompany you and drive you home.   This person must remain in the waiting room during your procedure.  Wear loose fitting clothing that is easily removed.  Leave jewelry and other valuables at home.  However, you may wish to bring a book to read or an iPod/MP3 player to listen to music as you wait for your procedure to start.  Remove all body piercing jewelry and leave at home.  Total time from sign-in until discharge  is approximately 2-3 hours.  You should go home directly after your procedure and rest.  You can resume normal activities the day after your procedure.  The day of your procedure you should not:   Drive   Make legal decisions   Operate machinery   Drink alcohol   Return to work  You will receive specific instructions about eating, activities and medications before you leave.    The above instructions have been reviewed and explained to me by   _______________________    I fully understand and can verbalize these instructions _____________________________ Date _________

## 2010-04-28 LAB — GLUCOSE, CAPILLARY: Glucose-Capillary: 219 mg/dL — ABNORMAL HIGH (ref 70–99)

## 2010-04-30 LAB — GLUCOSE, CAPILLARY: Glucose-Capillary: 242 mg/dL — ABNORMAL HIGH (ref 70–99)

## 2010-05-05 LAB — GLUCOSE, CAPILLARY: Glucose-Capillary: 134 mg/dL — ABNORMAL HIGH (ref 70–99)

## 2010-05-07 ENCOUNTER — Encounter: Payer: Self-pay | Admitting: Gastroenterology

## 2010-05-07 ENCOUNTER — Ambulatory Visit (AMBULATORY_SURGERY_CENTER): Payer: PRIVATE HEALTH INSURANCE | Admitting: Gastroenterology

## 2010-05-07 VITALS — BP 163/84 | HR 90 | Temp 97.2°F | Resp 20

## 2010-05-07 DIAGNOSIS — K222 Esophageal obstruction: Secondary | ICD-10-CM

## 2010-05-07 DIAGNOSIS — R1319 Other dysphagia: Secondary | ICD-10-CM

## 2010-05-07 DIAGNOSIS — K2970 Gastritis, unspecified, without bleeding: Secondary | ICD-10-CM

## 2010-05-07 DIAGNOSIS — R1310 Dysphagia, unspecified: Secondary | ICD-10-CM

## 2010-05-07 DIAGNOSIS — K219 Gastro-esophageal reflux disease without esophagitis: Secondary | ICD-10-CM

## 2010-05-07 DIAGNOSIS — K449 Diaphragmatic hernia without obstruction or gangrene: Secondary | ICD-10-CM

## 2010-05-07 DIAGNOSIS — K2960 Other gastritis without bleeding: Secondary | ICD-10-CM

## 2010-05-07 LAB — GLUCOSE, CAPILLARY: Glucose-Capillary: 351 mg/dL — ABNORMAL HIGH (ref 70–99)

## 2010-05-07 NOTE — Patient Instructions (Signed)
MILD GASTRITIS IN ANTRUM  HIATAL HERNIA STRICTURE GERD NOTHING BY MOUTH UNTIL 11:45,CLEAR LIQUIDS FROM 11:45--12:45 12:45 AND REMAINDER OF DAY SOFT DIET RESUME DIET TOMORROW CONTINUE CURRENT MEDICATIONS

## 2010-05-08 ENCOUNTER — Telehealth: Payer: Self-pay | Admitting: *Deleted

## 2010-05-08 NOTE — Progress Notes (Signed)
Addended by: Karl Bales on: 05/08/2010 03:51 PM   Modules accepted: Orders

## 2010-05-08 NOTE — Telephone Encounter (Signed)
Pt as asleep, spoke with granddaughter.  Granddaughter states that she didn't complain of any pain, was able to tolerate food and she was not aware of any questions that the pt may have.  Writer told her to have pt call back if she has any questions

## 2010-05-09 DIAGNOSIS — K2990 Gastroduodenitis, unspecified, without bleeding: Secondary | ICD-10-CM

## 2010-05-09 DIAGNOSIS — K2970 Gastritis, unspecified, without bleeding: Secondary | ICD-10-CM

## 2010-05-09 DIAGNOSIS — K219 Gastro-esophageal reflux disease without esophagitis: Secondary | ICD-10-CM

## 2010-05-13 ENCOUNTER — Encounter: Payer: Self-pay | Admitting: Gastroenterology

## 2010-05-15 NOTE — Procedures (Signed)
Summary: Upper Endoscopy  Patient: [name] Note: All result statuses are Final unless otherwise noted.  Tests: (1) Upper Endoscopy (EGD)   EGD Upper Endoscopy       DONE     [facility]     [address]     [place], [place]  [address]          ENDOSCOPY PROCEDURE REPORT          PATIENT:  [name], [name]  MR#:  [id number]     BIRTHDATE:  [birth year], 76 yrs. old  GENDER:  female          ENDOSCOPIST:  Vania Rea. Jarold Motto, MD, [facility]     Referred by:          PROCEDURE DATE:  05/07/2010     PROCEDURE:  EGD with biopsy for H. pylori 29562, Maloney Dilation     of Esophagus     ASA CLASS:  Class III     INDICATIONS:  GERD, dysphagia          MEDICATIONS:   Fentanyl 50 mcg IV, Versed 4 mg IV     TOPICAL ANESTHETIC:  Exactacain Spray          DESCRIPTION OF PROCEDURE:   After the risks benefits and     alternatives of the procedure were thoroughly explained, informed     consent was obtained.  The LB GIF-H180 [id number] endoscope was     introduced through the mouth and advanced to the second portion of     the duodenum, without limitations.  The instrument was slowly     withdrawn as the mucosa was fully examined.     <<PROCEDUREIMAGES>>          Mild gastritis was found in the antrum. clo BX DONE.  A hiatal     hernia was found. 5 CM HH NOTED  A stricture was found at the     gastroesophageal junction. DILATED #11F MALONEY DILATOR.NO HEME OR     PAIN.  Otherwise the examination was normal.    Retroflexed views     revealed a hiatal hernia.    The scope was then withdrawn from the     patient and the procedure completed.          COMPLICATIONS:  None          ENDOSCOPIC IMPRESSION:     1) Mild gastritis in the antrum     2) Hiatal hernia     3) Stricture at the gastroesophageal junction     4) Otherwise normal examination     5) A hiatal hernia     CHRONIC GERD AND SECONDARY STRICTURE DILATED.R/O H.PYLORI     GASTRITIS.     RECOMMENDATIONS:     1)  Await biopsy results     2) Clear liquids until, then soft foods rest iof day. Resume     prior diet tomorrow.     3) continue current medications          REPEAT EXAM:  No          ______________________________     Vania Rea. Jarold Motto, MD, [facility] Graham          CC:  Margarito Liner, MD          n.     Rosalie DoctorMarland Kitchen   Vania Rea. Araceli Coufal at 05/07/2010 09:36 AM          Nada Libman, [phone]  Note: An exclamation mark Marland Kitchen)  indicates a result that was not dispersed into the flowsheet. Document Creation Date: 05/07/2010 9:36 AM _______________________________________________________________________  (1) Order result status: Final Collection or observation date-time: 05/07/2010 09:23 Requested date-time:  Receipt date-time:  Reported date-time:  Referring Physician:   Ordering Physician: Sheryn Bison [phone]) Specimen Source:  Source: Launa Grill Order Number: [id number] Lab site:

## 2010-05-26 LAB — GLUCOSE, CAPILLARY: Glucose-Capillary: 233 mg/dL — ABNORMAL HIGH (ref 70–99)

## 2010-06-03 LAB — GLUCOSE, CAPILLARY: Glucose-Capillary: 318 mg/dL — ABNORMAL HIGH (ref 70–99)

## 2010-07-01 NOTE — Letter (Signed)
January 03, 2008   Alfonse Ras, MD  [phone], [address]  [place]  [place] [address]   Re:  SHAQUNA, GEIGLE              DOB:  [birth year]   Dear Baxter Hire,   I saw the patient back today after a bronchoscopy and all the biopsies  were negative, go along with more inflammatory lesion.  We did not find  any evidence of cancer.  I think this is probably right, it could be a  bronchoalveolar, but this could also be just severe chronic inflammation  of this area.  I reviewed with a PET scan she had approximately 2 years  ago and there was similar finding and something quite similar in the  right upper lobe.  Given her multiple problems, I think the best thing  to do is to just follow her with serial CTs and see if this gets any  bigger.  I will plan to get another CT scan in 3 months and if that is  stable, then follow her on a 67-month schedule.  I appreciate the  opportunity.  Her blood pressure is 185/99, pulse 96, respirations 18,  and sats were 96%.  I will see her back again in 3 months after a next  CT scan.   Ines Bloomer, M.D.  Electronically Signed   DPB/MEDQ  D:  01/03/2008  T:  01/03/2008  Job:  [id number]

## 2010-07-01 NOTE — Op Note (Signed)
NAMEMARRY, KUSCH              ACCOUNT NO.:  [id number]   MEDICAL RECORD NO.:  [id number]          PATIENT TYPE:  AMB   LOCATION:  SDS                          FACILITY:  [facility]   PHYSICIAN:  Ines Bloomer, M.D. DATE OF BIRTH:  04/30/1933   DATE OF PROCEDURE:  DATE OF DISCHARGE:                               OPERATIVE REPORT   PREOPERATIVE DIAGNOSIS:  Right upper lobe mass.   POSTOPERATIVE DIAGNOSIS:  Right upper lobe mass.   OPERATION PERFORMED:  Video bronchoscope.   ANESTHESIA:  Local anesthesia with Cetacaine, Xylocaine, and IV  sedation.   The patient was prepped and draped in usual sterile manner.  Video  bronchoscope was passed through the mouth.  The cords were in the  midline.  The right upper lobe was normal.  The left mainstem bronchus  was normal.  Left upper lobe and left lower lobe orifices were normal.  The distal carina was normal.  The bronchus intermedius, right middle  lobe, and right lower lobe orifices were normal and in the anterior  segment of the right upper lobe, there was some narrowing and at this  area of narrowing we did brushings and biopsies of this area.  No true  endobronchial lesion was seen.  Video bronchoscope was removed.  The  patient was returned to recovery room in stable condition.      Ines Bloomer, M.D.  Electronically Signed     DPB/MEDQ  D:  12/29/2007  T:  12/29/2007  Job:  [id number]

## 2010-07-01 NOTE — Letter (Signed)
December 19, 2007   Alfonse Ras, MD  [phone] [address]  [place]  [place] [address]   Re:  [name], [name]              DOB:  Jun 03, 1933   Dear Baxter Hire,   I appreciate the opportunity of seeing the patient.  This 77 year old  patient was a smoker in the past, underwent excision of a left shoulder  lipoma.  Chest x-ray was found to have a right upper lobe 1.8 x 4-cm  lesion.  She has no hemoptysis, fever, chills, or excessive sputum, but  this was worse since her first some type of mixed solid and ground-glass  mass that could go along with a low-grade bronchoalveolar cancer or  adenocarcinoma.  She also has a large hemangioma of the right lobe of  the liver.  She has had no fever, chills, or excessive sputum.  Her  weight has been stable.  She has no allergies.  Her medications include  lisinopril 40 mg a day, hydrochlorothiazide 25 mg a day, Vytorin 10/40  daily, NovoLog, Norvasc 10 mg a day, Arthrotec 50 mg a day, Lantus,  omeprazole 20 mg a day.  She has diabetes, hypertension, and  hypercholesterolemia.   SOCIAL HISTORY:  She is widow, has 5 children.  Quit smoking 10 years  ago.  Does not drink alcohol on regular basis.   FAMILY HISTORY:  Noncontributory.   REVIEW OF SYSTEMS:  General:  Her weight has been stable.  Cardiac:  No  angina or atrial fibrillation.  Pulmonary:  See history of present  illness.  No wheezing or hemoptysis.  GI:  No nausea, vomiting,  constipation, or diarrhea.  GU:  No kidney disease, dysuria, or frequent  urination.  Vascular:  No claudication, DVT, or TIAs.  Neurological:  No  dizziness, headaches, blackouts, or seizures.  Musculoskeletal:  Chronic  arthritis.  Psychiatric:  No depression or nervousness.  ENT:  No change  in eyesight or hearing.  Hematological:  No problems with bleeding or  clotting disorders.   PHYSICAL EXAMINATION:  GENERAL:  She is a slightly obese African  American female in no acute distress.  HEAD, EYES,  EARS, NOSE, and THROAT:  Unremarkable.  NECK:  Supple without thyromegaly.  There is no supraclavicular or  axillary adenopathy.  CHEST:  Clear to auscultation and percussion.  HEART:  Regular sinus rhythm.  No murmurs.  ABDOMEN:  Soft.  There is no hepatosplenomegaly.  EXTREMITIES:  Pulses are 2+.  There is no clubbing or edema.  Left  shoulder, there is a well-healed surgical incision.  NEUROLOGICAL:  She is oriented x3.  Sensory and motor intact.   I feel that the patient has a good chance this may be a bronchoalveolar  cancer, but this also could be inflammatory process.  I will plan to get  a PET scan on her and then do a bronchoscopy on December 29, 2007, at  [facility]  I will let you know the results.   Sincerely,   Ines Bloomer, M.D.  Electronically Signed   DPB/MEDQ  D:  12/19/2007  T:  12/19/2007  Job:  [id number]

## 2010-07-01 NOTE — Op Note (Signed)
[name], [name]              ACCOUNT NO.:  [id number]   MEDICAL RECORD NO.:  [id number]          PATIENT TYPE:  AMB   LOCATION:  [facility]                         FACILITY:  [facility]   PHYSICIAN:  Alfonse Ras, MD   DATE OF BIRTH:  01-28-1934   DATE OF PROCEDURE:  DATE OF DISCHARGE:                               OPERATIVE REPORT   PREOPERATIVE DIAGNOSIS:  Large subcutaneous mass on the left upper back.   POSTOPERATIVE DIAGNOSIS:  Large subcutaneous mass on the left upper  back, most consistent with lipoma.   PROCEDURE:  Wide excision of left upper back mass.   ANESTHESIA:  General endotracheal tube.   SURGEON:  Alfonse Ras, M.D.   ESTIMATED BLOOD LOSS:  Minimal.   DRAINS:  #19 JP drain placed.   DESCRIPTION:  The patient was taken to the operating room and placed in  supine position.  After adequate general anesthesia was induced using  endotracheal tube, the patient was placed in a right lateral decubitus  position and the left upper back was prepped and draped in normal  sterile fashion.  Using a vertical incision overlying the palpable mass,  I dissected down through subcutaneous tissue onto a multilobulated soft  tissue mass most consistent with a lipoma.  This was taken off the  muscular fascia and blocked.  Adequate hemostasis was insured.  A #19  Blake drain was placed through a separate stab wound and placed in the  cavity.  The skin was closed with staples.  All tissues were injected  with 0.5 Marcaine.  The patient tolerated the procedure well.  After  sterile dressing was applied, she was taken to PACU in good condition.      Alfonse Ras, MD  Electronically Signed     KRE/MEDQ  D:  11/09/2007  T:  11/09/2007  Job:  [id number]

## 2010-07-01 NOTE — Letter (Signed)
April 10, 2008   Alfonse Ras, MD  [address]  [place], [place] [address]   Re:  [name], [name]              DOB:  1933-11-02   Dear Baxter Hire,   I saw the patient back today for a followup of a right upper lobe lesion  and her CT scan showed no evidence of recurrence.  She apparently has  another CT scan scheduled in 3 days through the [facility],  so I told her not to have anything done at that time since I am  following her and not to get another CT scan.  Since the CT scan showed  that there is no change in the lesion, it maybe slightly smaller, I will  see her back again in 4 months with another CT scan.  This looks more  like this is a benign inflammatory process.  Her blood pressure is  122/64, pulse 96, respirations 18, and sats are 98% and she is having no  more problems.  From a subsequent symptomatic standpoint, she is  complaining of some right shoulder pain.   Ines Bloomer, M.D.  Electronically Signed   DPB/MEDQ  D:  04/10/2008  T:  04/10/2008  Job:  [id number]   cc:   Zara Council, MD

## 2010-07-04 NOTE — Op Note (Signed)
NAMEBARBARA, [name]                          ACCOUNT NO.:  [id number]   MEDICAL RECORD NO.:  [id number]                   PATIENT TYPE:  OIB   LOCATION:  2864                                 FACILITY:  [facility]   PHYSICIAN:  Salley Scarlet., M.D.         DATE OF BIRTH:  June 04, 1933   DATE OF PROCEDURE:  03/08/2003  DATE OF DISCHARGE:  03/08/2003                                 OPERATIVE REPORT   PREOPERATIVE DIAGNOSIS:  Immature cataract, left eye.   POSTOPERATIVE DIAGNOSIS:  Immature cataract, left eye.   OPERATION PERFORMED:  Extracapsular cataract extraction with intraocular  lens implantation.   SURGEON:  Nadyne Coombes, M.D.   ANESTHESIA:  Local using Xylocaine 2% and Marcaine 0.75% and [place].   INDICATIONS FOR PROCEDURE:  The patient is a 77 year old lady who complains  of blurring of vision with difficulty seeing to read.  She was evaluated and  found to have a visual acuity best corrected to 20/30 on the right, 20/70 on  the left.  There were bilateral posterior subcapsular cataracts worse on the  left than on the right.  Cataract extraction of the left eye with  intraocular lens implantation was recommended.  She was admitted at this  time for that purpose.   DESCRIPTION OF PROCEDURE:  Under the influence of intravenous sedation and  Van Lint akinesia, retrobulbar anesthesia was given.  The patient was  prepped and draped in the usual manner.  A lid speculum was inserted under  the upper and lower lid of the left eye and a 4-0 silk traction suture was  passed through the belly of the superior rectus muscle for traction.  A  fornix-based conjunctival flap was turned and hemostasis achieved using  cautery.  An incision was made in the sclera at the limbus using ___________  blade.  This incision was dissected down to clear cornea using clear cornea  using crescent blade.  A side port incision was made at the 1:30 o'clock  position.  OcuCoat was injected into the  eye through the side port incision.  The anterior chamber was entered through the corneoscleral tunnel incision  at the 11:30 o'clock position.  An anterior capsulotomy was done using a  bent 25 gauge needle.  The nucleus was hydrodissected using x-ray.  The  [facility] phacoemulsification handpiece was passed into the eye and the nucleus  was begun to be emulsified.  It was then noted that the pupil became  extremely miotic.  The decision was made to convert the procedure to an  extracapsular one.  Therefore, the corneoscleral wound was then extended  first toward the left and then toward the right.  Placing a single 8-0  Vicryl suture across each arm of the incision as it was made respectively  toward the left, then toward the right.  The nucleus was then manually  expressed from the eye.  The two previously placed sutures were closed and a  third was placed at the 7 o'clock position.  The irrigation aspiration  handpiece was passed into the eye and the nucleus was emulsified without  difficulty.  The residual cortical material was aspirated.  The posterior  capsule was polished with the olive tip polisher.  A oval 5 x 6 intraocular  lens was passed into the eye behind the iris without difficulty.  The  anterior chamber was reformed and the pupil was constricted using Miochol.  The corneoscleral wound was closed using a combination of interrupted  sutures of 8-0 Vicryl and 10-0 nylon.  After it was ascertained that the  wound was airtight and watertight, the conjunctiva was closed using thermal  cautery.  1 mL of Celestone and 0.5 mL of gentamicin were injected  subconjunctivally.  Maxitrol ophthalmic ointment and Pilopine ointment were  applied along with a patch and Fox shield.  The patient tolerated the  procedure well and was discharged to the post anesthesia recovery room in  satisfactory condition.  The patient was instructed to rest today, to take  Vicodin every four hours as needed  for pain and to see me in the office  tomorrow for further evaluation.   DISCHARGE DIAGNOSIS:  Immature cataract, left eye.                                               Salley Scarlet., M.D.    TB/MEDQ  D:  03/08/2003  T:  03/09/2003  Job:  [id number]

## 2010-07-04 NOTE — Discharge Summary (Signed)
[name], [name]                          ACCOUNT NO.:  [id number]   MEDICAL RECORD NO.:  [id number]                   PATIENT TYPE:  INP   LOCATION:  3027                                 FACILITY:  [facility]   PHYSICIAN:  Nani Gasser, M.D.            DATE OF BIRTH:  11-Dec-1933   DATE OF ADMISSION:  07/01/2002  DATE OF DISCHARGE:  07/04/2002                                 DISCHARGE SUMMARY   DISCHARGE DIAGNOSES:  1. 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  2. Diabetes mellitus, poorly controlled, type 2.  3. Hypertension.  4. Degenerative disk disease.  5. Bilateral cataracts.  6. Dyslipidemia.  7. Peripheral artery disease.   DISCHARGE MEDICATIONS:  1. Aspirin 325 mg one p.o. daily.  2. Zocor 40 mg one p.o. daily.  3. Protonix 40 mg one p.o. daily.  4. Enalapril 5 mg one p.o. daily.  5. Glucotrol 10 mg one p.o. daily with meal.  6. Metformin 850 mg one p.o. b.i.d.  7. HCTZ 25 mg one p.o. daily.   DISCHARGE INSTRUCTIONS:  Patient is to continue her PT and OT at home.   DIET:  Patient is to eat low amounts of concentrated sugars.   FOLLOW UP:  Patient is to follow up with her primary care physician in three  to four weeks.   HOSPITAL COURSE:  1. Patient is a 77 year old African-American female with a history of     diabetes and hypertension who was brought into the ED by her family this     morning after complaining of unable to stand up because of numbness and     weakness in her left leg.  Patient was evaluated in the emergency     department; she did have a head CT, which just showed some chronic     ischemic changes and fluid in the right mastoid cells.  Of importance, on     physical exam, patient did have a decreased gag reflex, her tongue     deviated to the right, and she was also leaning to the right with her     gait.  She was having difficulty positioning her left leg during the     gait.  She also had left arm pronator drift and left-sided apraxia, and     motor delay of function was found on the upper extremity and lower     extremity.  Because of exam, patient then had an MRI of the head with     contrast and without, which did show ischemic stroke along the right     frontal cortex and into the right-sided motor fibers along the MCA     distribution.  Patient also had a neck MRA, which showed diffuse arterial     disease but no single point of stenosis that could be surgically  corrected.  Patient also had an EKG that was 96 beats per minute with     normal sinus rhythm with questionable leftward axis and nonspecific ST-     wave changes.  Patient was admitted and kept on telemetry overnight to     rule out any signs of myocardial ischemia or arrhythmia, which patient     did not have.  Neurology was consulted, and Dr. Thad Ranger saw the patient;     he agreed with the diagnosis of acute right brain stroke with left     hemiparesis and hemiataxia.  Stroke was treated with aspirin, and patient     was placed on __________ hose because of a heme-postive stool.  She did     get carotid Dopplers, which showed a bilateral mild homogeneous plaque     formation and bifurcation bilaterally.  There was no ICA stenosis, and     vertebral artery flow was antegrade.  Patient also had an echocardiogram;     please see chart for results.  We also checked a fasting lipid panel,     which showed a cholesterol of 239, triglycerides of 91, and LDL of 176.     Of note, after several hours in the ED, patient did have a seizure, per     the ED physician, which required 3 mg of Ativan before stopping, and then     patient had partial right-sided neglect only temporarily.  Patient has     recovered quite well during her stay, but she still has a fair amount of     motor delay and some apraxia on that left side.  Patient will benefit     from PT and OT as an outpatient.  2. Diabetes  mellitus.  Patient's fasting CBGs were elevated here; thus,     after making sure that patient was able to take p.o., we restarted her on     her Glucotrol 10 mg and then increased her Metformin to 850 mg b.i.d.     Patient will need to have her blood glucoses followed carefully.  I have     a feeling that patient will end up needing to go on insulin and she will     not be able to reach her goal hemoglobin A1c of less than 7 on Glucotrol     and Metformin alone, but we will try to maximize these therapies first.  3. Prophylaxis.  Patient was placed on __________ hose.  4. Hypertension.  We were holding patient's home blood pressure medications.     We did restart Enalapril while here, and on discharge, patient can return     to taking her HCTZ 25 mg one per day.  5. Osteoarthritis.  Patient was given Tylenol p.r.n.  6. Dyslipidemia.  Patient was started on 40 mg of Zocor p.o. daily.                                               Nani Gasser, M.D.    CM/MEDQ  D:  07/04/2002  T:  07/04/2002  Job:  [id number]   cc:   Casimiro Needle L. Thad Ranger, M.D.  [address]  [address]  [place]  [place] [address]  Fax: [phone]   Laurell Roof, M.D.  Int Medicine Resident - [address]  [place], [place] [address]  Fax: [phone]

## 2010-07-04 NOTE — Consult Note (Signed)
[name], [name]                          ACCOUNT NO.:  [id number]   MEDICAL RECORD NO.:  [id number]                   PATIENT TYPE:  INP   LOCATION:  3313                                 FACILITY:  [facility]   PHYSICIAN:  Casimiro Needle L. Thad Ranger, M.D.           DATE OF BIRTH:  November 16, 1933   DATE OF CONSULTATION:  07/01/2002  DATE OF DISCHARGE:                                   CONSULTATION   REQUESTING PHYSICIAN:  Dr. Talmage Coin.   REASON FOR CONSULTATION:  Stroke and suspected seizure.   HISTORY OF PRESENT ILLNESS:  This is the initial inpatient consultation  evaluation of this 77 year old woman with a past medical history including  hypertension and diabetes who was reportedly in fairly good health till  about a week ago when she began to feel generally unwell and a bit off  balance.  She also developed a right sided headache and was actually seen  yesterday in the internal medicine resident's clinic.  She was evaluated by  ophthalmology at that time out of concern for possible glaucoma but that was  ruled out.  She did not have any other particular neurologic complaints or  signs until late last night when she did notice after awakening about 11  that her left leg was somewhat weak.  She then went back to bed and upon  awakening at 5:30 this morning had a definite weakness on her left side.  She was brought to [facility] Emergency Room and noted on examination to  have a left hemiparesis and hemiapraxia.  She had an MRI done and was on her  way to the floor when she suddenly had a forced leftward turn of her head  associated with unresponsiveness.  This lasted about 5 minutes.  There was  no definite motor twitching identified with this episode although the nurses  report that her right arm moved a little bit, not up and down though not a  clonic motion.  She was given Ativan 3 mg following this episode and  presently is quite stuporous.  The history obtained is from the chart,  the  medicine intern and the ER nursing staff.   PAST MEDICAL HISTORY:  1. Hypertension.  2. Diabetes.  It sounds like both of these are in fairly poor control.   FAMILY/SOCIAL HISTORY/REVIEW OF SYSTEMS:  Per admission H&P.   MEDICATIONS:  1. Glipizide.  2. Metformin.  3. HCTZ.   PRIMARY CARE PHYSICIAN:  She is followed for primary care in the resident's  clinic.   PHYSICAL EXAMINATION:  VITAL SIGNS:  Temperature 97.0, blood pressure  162/94, pulse 102, respirations 18, O2 sat 98% on 2 liters.  GENERAL/MENTAL STATUS:  She is stuporous and lying supine on the stretcher.  She does not answer questions or provide any verbal or responsive speech.  She is able to follow a few simple commands such as squeezing hand and  protruding her tongue.  HEAD:  Cranium is normocephalic, atraumatic.  Oropharynx is benign.  NECK:  Supple without carotid bruit.  HEART:  Tachycardic, regular rhythm.  No murmurs.  NEUROLOGIC:  Cranial nerves, funduscopic exam shows no gross abnormalities  of the fundi.  Pupils are equal and briskly reactive.  She does have a  doll's eyes and corneal reflexes.  She does spontaneously gag and cough.  There is no obvious facial droop.  SENSORY/MOTOR:  She does demonstrate occasional spontaneous movement on the  right.  She is able to move all extremities to command, the right much more  effectively then the left.  She withdraws all extremities to pain but again  the right more effectively then the left.  Reflexes are generally diminished  throughout.  Toes are downgoing.   LABORATORY REVIEW:  CBC, white count 6.5, hemoglobin 14.3, platelets  383,000.  C-Met is remarkable for an elevated glucose of 393 otherwise  normal.  Coags are normal.  Stool is heme positive.   MRI of the brain is personally reviewed with the residents and demonstrates  an acute right frontal stroke with some deeper extension into the  subcortical white matter.  Some of this extension comes all  the way down to  the paraventricular centrum semiovale and this may actually represent  separate area of ischemia.   The MRA demonstrates some marked diffuse large vessel disease throughout the  carotid and vertebrobasilar circulations.   IMPRESSION:  1. Acute right brain stroke with left hemiparesis and a hemiapraxia.  2. Single focal seizure secondary to number one.  3. Poorly controlled diabetes.  4. Poorly controlled hypertension.   PLAN:  1. We will treat the stroke with aspirin.  Consider adding Plavix if she is     already on aspirin and there is no record that she is and getting too     aggressive with any blood therapy might complicate her problem with heme     positive stools.  Consider Lovenox DVT prophylaxis.  We will like     __________ again because of her heme positive stool.  2. She needs a carotid doppler, 2D echocardiogram and stroke workup     including if not recently done lipid panel, homocystine level, hemoglobin     A1-C, TSH.  3. Also at this time will load with Dilantin but not start  maintenance     doses that she may not need any further seizure prophylaxis.  4. Will follow with you, thanks for the consultation.                                                 Michael L. Thad Ranger, M.D.    MLR/MEDQ  D:  07/01/2002  T:  07/01/2002  Job:  [id number]

## 2010-07-08 ENCOUNTER — Emergency Department (HOSPITAL_COMMUNITY): Payer: PRIVATE HEALTH INSURANCE

## 2010-07-08 ENCOUNTER — Inpatient Hospital Stay (HOSPITAL_COMMUNITY)
Admission: EM | Admit: 2010-07-08 | Discharge: 2010-07-11 | DRG: 069 | Disposition: A | Discharge status: Home Health | Payer: PRIVATE HEALTH INSURANCE | Attending: Internal Medicine | Admitting: Internal Medicine

## 2010-07-08 ENCOUNTER — Encounter: Payer: Self-pay | Admitting: Internal Medicine

## 2010-07-08 ENCOUNTER — Encounter: Payer: PRIVATE HEALTH INSURANCE | Admitting: Internal Medicine

## 2010-07-08 DIAGNOSIS — M129 Arthropathy, unspecified: Secondary | ICD-10-CM | POA: Diagnosis present

## 2010-07-08 DIAGNOSIS — M25511 Pain in right shoulder: Secondary | ICD-10-CM | POA: Insufficient documentation

## 2010-07-08 DIAGNOSIS — K219 Gastro-esophageal reflux disease without esophagitis: Secondary | ICD-10-CM | POA: Diagnosis present

## 2010-07-08 DIAGNOSIS — E785 Hyperlipidemia, unspecified: Secondary | ICD-10-CM | POA: Diagnosis present

## 2010-07-08 DIAGNOSIS — G459 Transient cerebral ischemic attack, unspecified: Principal | ICD-10-CM | POA: Diagnosis present

## 2010-07-08 DIAGNOSIS — Z7982 Long term (current) use of aspirin: Secondary | ICD-10-CM

## 2010-07-08 DIAGNOSIS — Z8673 Personal history of transient ischemic attack (TIA), and cerebral infarction without residual deficits: Secondary | ICD-10-CM

## 2010-07-08 DIAGNOSIS — R918 Other nonspecific abnormal finding of lung field: Secondary | ICD-10-CM | POA: Insufficient documentation

## 2010-07-08 DIAGNOSIS — Z23 Encounter for immunization: Secondary | ICD-10-CM

## 2010-07-08 DIAGNOSIS — E1169 Type 2 diabetes mellitus with other specified complication: Secondary | ICD-10-CM | POA: Diagnosis present

## 2010-07-08 DIAGNOSIS — E876 Hypokalemia: Secondary | ICD-10-CM | POA: Diagnosis present

## 2010-07-08 DIAGNOSIS — I10 Essential (primary) hypertension: Secondary | ICD-10-CM | POA: Diagnosis present

## 2010-07-08 DIAGNOSIS — R911 Solitary pulmonary nodule: Secondary | ICD-10-CM | POA: Diagnosis present

## 2010-07-08 DIAGNOSIS — Z794 Long term (current) use of insulin: Secondary | ICD-10-CM

## 2010-07-08 DIAGNOSIS — D320 Benign neoplasm of cerebral meninges: Secondary | ICD-10-CM | POA: Diagnosis present

## 2010-07-08 LAB — DIFFERENTIAL
Basophils Absolute: 0 10*3/uL (ref 0.0–0.1)
Eosinophils Relative: 1 % (ref 0–5)
Lymphocytes Relative: 33 % (ref 12–46)
Lymphs Abs: 2.2 10*3/uL (ref 0.7–4.0)
Neutro Abs: 3.8 10*3/uL (ref 1.7–7.7)
Neutrophils Relative %: 57 % (ref 43–77)

## 2010-07-08 LAB — CBC
HCT: 36 % (ref 36.0–46.0)
MCV: 78.4 fL (ref 78.0–100.0)
RBC: 4.59 MIL/uL (ref 3.87–5.11)
RDW: 14.4 % (ref 11.5–15.5)
WBC: 6.6 10*3/uL (ref 4.0–10.5)

## 2010-07-08 LAB — POCT CARDIAC MARKERS
CKMB, poc: <1 ng/mL — ABNORMAL LOW (ref 1.0–8.0)
Troponin i, poc: <0.05 ng/mL (ref 0.00–0.09)

## 2010-07-08 LAB — POCT I-STAT, CHEM 8
BUN: 10 mg/dL (ref 6–23)
Calcium, Ion: 1.12 mmol/L (ref 1.12–1.32)
Chloride: 102 mEq/L (ref 96–112)
Glucose, Bld: 83 mg/dL (ref 70–99)
HCT: 37 % (ref 36.0–46.0)
Potassium: 3 mEq/L — ABNORMAL LOW (ref 3.5–5.1)

## 2010-07-08 LAB — URINALYSIS, ROUTINE W REFLEX MICROSCOPIC
Bilirubin Urine: NEGATIVE
Glucose, UA: NEGATIVE mg/dL
Hgb urine dipstick: NEGATIVE
Specific Gravity, Urine: 1.006 (ref 1.005–1.030)
pH: 7.5 (ref 5.0–8.0)

## 2010-07-08 LAB — COMPREHENSIVE METABOLIC PANEL
ALT: 8 U/L (ref 0–35)
AST: 10 U/L (ref 0–37)
Alkaline Phosphatase: 65 U/L (ref 39–117)
CO2: 32 mEq/L (ref 19–32)
Glucose, Bld: 73 mg/dL (ref 70–99)
Potassium: 2.8 mEq/L — ABNORMAL LOW (ref 3.5–5.1)
Sodium: 142 mEq/L (ref 135–145)
Total Protein: 6.8 g/dL (ref 6.0–8.3)

## 2010-07-08 LAB — GLUCOSE, CAPILLARY: Glucose-Capillary: 124 mg/dL — ABNORMAL HIGH (ref 70–99)

## 2010-07-08 LAB — PROTIME-INR
INR: 1.01 (ref 0.00–1.49)
Prothrombin Time: 13.5 seconds (ref 11.6–15.2)

## 2010-07-08 LAB — URINE MICROSCOPIC-ADD ON

## 2010-07-08 LAB — HEMOGLOBIN A1C: Mean Plasma Glucose: 200 mg/dL — ABNORMAL HIGH (ref <117)

## 2010-07-08 MED ORDER — GADOBENATE DIMEGLUMINE 529 MG/ML IV SOLN
15.0000 mL | Freq: Once | INTRAVENOUS | Status: AC | PRN
  Administered 2010-07-08: 15 mL via INTRAVENOUS

## 2010-07-08 NOTE — Progress Notes (Signed)
Hospital Admission Note Date: 07/08/2010  Patient name: [name] Medical record number: [id number] Date of birth: [birth year] Age: 77 y.o. Gender: female PCP: Mliss Sax, MD  Medical Service:  Attending physician: Dr. Blanch Media   Pager: Resident [phone]): Dr. Baltazar Apo         Pager: [phone] Resident (R1):  Dr. Narda Bonds              Pager: [phone]  Chief Complaint:  History of Present Illness: Pt is a 77 y/o woman with h/o poorly controlled DM2, HTN, CVA in 2004, presenting with bilateral LE weakness. Patient noted bilateral weakness suddenly in her LE bilaterally sometime between 10.30am and 11am, couldn't walk and had to lean on the stove for support but eventually her legs gave out on her. However she did not fall nor did she lose consciousness. Subsequent to that she also experienced slurred speech. During this period she denied having any headache, visual changes, dizziness, chest pain, palpitations, sob or n/v. EMS was called by her daughter. Per EMS note, en route to the ER, the patient developed left sided facial droop and left sided weakness, both of which resolved on arrival to the ER.   Current Outpatient Prescriptions  Medication Sig Dispense Refill  . amLODipine (NORVASC) 10 MG tablet Take 10 mg by mouth daily.        Marland Kitchen aspirin 81 MG chewable tablet Chew 81 mg by mouth daily.        . citalopram (CELEXA) 20 MG tablet Take 20 mg by mouth at bedtime.        Marland Kitchen glucose blood (FREESTYLE LITE) test strip Use to test blood glucose three times a day. Dx: 250.00       . insulin aspart (NOVOLOG FLEXPEN) 100 UNIT/ML injection Inject 5 Units into the skin. 15 minutes before your lunch and dinner.      . insulin glargine (LANTUS SOLOSTAR) 100 UNIT/ML injection Inject 65 Units into the skin at bedtime.        . Insulin Pen Needle (PEN NEEDLES) 31G X 8 MM MISC Use to inject insulin 4 times daily.      . Lancets MISC Use to test blood glucose three times a day. Dx: 250.00        .  lisinopril-hydrochlorothiazide (PRINZIDE,ZESTORETIC) 20-25 MG per tablet Take 1 tablet by mouth daily.        Marland Kitchen oxyCODONE-acetaminophen (PERCOCET) 7.5-500 MG per tablet Take 1 tablet by mouth 3 (three) times daily as needed. For pain.       . pravastatin (PRAVACHOL) 20 MG tablet Take 20 mg by mouth at bedtime.         Allergies: Review of patient's allergies indicates no known allergies.  Past Medical History  Diagnosis Date  . Diabetes mellitus 2007    HgA1C (02/20/2010) = 9.2, HgA1C (03/20/2009) = 12.1  . Hyperlipidemia   . Hypertension   . GERD (gastroesophageal reflux disease)   . CVA (cerebrovascular accident) 2006     right embolic stroke, no residual deficits  . Diverticulitis   . CVA (cerebral infarction) 7-yrs ago  . Arthritis   . Dysphagia     Past Surgical History  Procedure Date  . Abdominal hysterectomy     Family History  Problem Relation Age of Onset  . Hyperlipidemia Brother   . Hypertension Brother   . Diabetes Brother     History   Social History  . Marital Status: Widowed    Spouse Name: N/A  Number of Children: N/A  . Years of Education: N/A   Occupational History  . Not on file.   Social History Main Topics  . Smoking status: Former Games developer  . Smokeless tobacco: Not on file  . Alcohol Use: No  . Drug Use: No  . Sexually Active:    Other Topics Concern  . Not on file   Social History Narrative   Patient requests to use [facility] for her diabets testing supplies as of 09/05/2009.    Review of Systems: Pertinent items are noted in HPI.  Physical Exam: Vitals T 98.2, P 76, BP 152/132, RR 16, O2 Sat 99% RA General appearance: alert, cooperative, appears stated age and no distress Eyes: conjunctivae/corneas clear. PERRL, EOM's intact. Fundi benign. Throat: lips, mucosa, and tongue normal; teeth and gums normal Lungs: clear to auscultation bilaterally Heart: irregularly irregular rhythm Abdomen: soft, non-tender; bowel  sounds normal; no masses,  no organomegaly Extremities: extremities normal, atraumatic, no cyanosis or edema Pulses: 2+ and symmetric Neurologic: Cranial nerves: normal Sensory: normal Motor:4/5 LLE strength, 5/5 in RLE and UE bilaterally Reflexes: 2+ and symmetric Coordination: finger to nose normal bilaterally, heel to shin normal bilaterally  Lab results: CBC:    Component Value Date/Time   WBC 6.6 07/08/2010 1334   HGB 12.6 07/08/2010 1354   HCT 37.0 07/08/2010 1354   PLT 378 07/08/2010 1334   MCV 78.4 07/08/2010 1334   NEUTROABS 3.8 07/08/2010 1334   LYMPHSABS 2.2 07/08/2010 1334   MONOABS 0.6 07/08/2010 1334   EOSABS 0.1 07/08/2010 1334   BASOSABS 0.0 07/08/2010 1334    TCO2                                     32                0-100            mmol/L  Ionized Calcium                          1.12              1.12-1.32        mmol/L  Hemoglobin (HGB)                         12.6              12.0-15.0        g/dL  Hematocrit (HCT)                         37.0              36.0-46.0        %  Sodium (NA)                              144               135-145          mEq/L  Potassium (K)                            3.0        l      3.5-5.1  mEq/L  Chloride                                 102               96-112           mEq/L  Glucose                                  83                70-99            mg/dL  BUN                                      10                6-23             mg/dL  Creatinine                               0.70              0.4-1.2          Mg/dL   CKMB, POC                                <1.0       l      1.0-8.0          ng/mL  Troponin I, POC                          <0.05             0.00-0.09        ng/mL  Myoglobin, POC                           44.5              12-200           Ng/mL   Color, Urine                             YELLOW            YELLOW  Appearance                               HAZY       a      CLEAR  Specific Gravity                          1.006             1.005-1.030  pH                                       7.5  5.0-8.0  Urine Glucose                            NEGATIVE          NEG              mg/dL  Bilirubin                                NEGATIVE          NEG  Ketones                                  NEGATIVE          NEG              mg/dL  Blood                                    NEGATIVE          NEG  Protein                                  NEGATIVE          NEG              mg/dL  Urobilinogen                             0.2               0.0-1.0          mg/dL  Nitrite                                  POSITIVE   a      NEG  Leukocytes                               TRACE      a      NEG  Squamous Epithelial / LPF                MANY       a      RARE  WBC / HPF                                3-6               <3               WBC/hpf  RBC / HPF                                0-2               <3               RBC/hpf  Bacteria / HPF  MANY       a      RARE  Imaging results:    Clinical Data: Weakness and chest pain    CHEST - 2 VIEW    Comparison: Chest radiograph 11/09/2007, chest CT 12/12/2007 and   04/10/2008 and PET CT 12/22/2007.    Findings: There is a spiculated mass in the right upper lobe that   measures a maximum diameter of 4 cm on chest radiograph.  Compared   to a two-view chest radiograph of 11/09/2007, this mass appears   slightly denser and may be a few millimeters larger. 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  No new lung   masses identified.  There is no airspace disease, effusion, or   evidence of lymphadenopathy.  The heart size is mildly enlarged.   No acute bony abnormality.    IMPRESSION:    1.  Spiculated mass right upper lobe appears minimally   denser/larger compared to a chest radiograph of 2009.   A low grade   primary bronchogenic carcinoma cannot be excluded, as described on   prior chest  CT.  Consider continued follow-up with pulmonology or   thoracic surgery.  Findings discussed with Dr. Rubin Payor by   telephone 2:15 p.m. 07/08/2010.   2.  No acute cardiopulmonary disease.  CT HEAD WITHOUT CONTRAST    Technique:  Contiguous axial images were obtained from the base of   the skull through the vertex without contrast.    Comparison: MR dated 06/20/2004    Findings: No CT evidence of acute infarction.  Encephalomalacic   changes from prior right frontal and right basal ganglia infarct.   Periventricular subcortical white matter small vessel ischemic   changes.    No evidence of parenchymal hemorrhage or extra-axial fluid   collection. No mass lesion, mass effect, or midline shift.    Global cortical and central atrophy with resulting   ventriculomegaly.    Images through the paranasal sinuses and skull base are motion   degraded.  There is partial opacification of the right sphenoid,   right ethmoid, and bilateral maxillary sinuses.    Mastoids are unopacified.  No evidence of calvarial fracture.    IMPRESSION:   No CT evidence of acute infarction. Encephalomalacic changes from   prior right frontal and right basal ganglia infarct. If there is   continued clinical concern, consider MRI brain for further   evaluation.    Right sphenoid, right ethmoid, and bilateral maxillary sinus   opacification.  Assessment & Plan by Problem:  1. Left sided weakness, now resolved - likely TIA, etiology likely 2/2 poorly controlled HTN and DM2. Also has a h/o prior stroke and at this point has failed ASA therapy. However, will admit for further workup to evaluate for other potential sources of stroke.  - admit to telemetry, monitor for arrythmia - Carb modified diet - since she has failed ASA therapy, will start Plavix - 12 lead EKG and cycle CEs to r/o ACS - check 2D-echo to rule out any cardiac embolus - MRI of brain/MRA of head to see if any more CNS lesion and  intravascular lesion. - Carotid doppler to rule out any stenosis or clots. - check Hb A1C, TSH, and FLP for risk stratification - PT/OT consult  2. Hypokalemia - likely 2/2 poor intake and mild dehydration. No h/o GI losses from vomiting or diarrhea. - replete orally and recheck level in am  3. HTN - poorly controlled - However  will hold for 24 hours in the setting of likely acute stroke  4. DM2 - poorly controlled - check A1C - Start Lantus at 50 units plus sliding scale, titrate and adjust dose as needed  5. Hyperlipidemia - check FLP - continue Statin in the meantime  6. DVT ppx - Lovenox

## 2010-07-09 DIAGNOSIS — R4789 Other speech disturbances: Secondary | ICD-10-CM

## 2010-07-09 DIAGNOSIS — I369 Nonrheumatic tricuspid valve disorder, unspecified: Secondary | ICD-10-CM

## 2010-07-09 DIAGNOSIS — G459 Transient cerebral ischemic attack, unspecified: Secondary | ICD-10-CM

## 2010-07-09 LAB — GLUCOSE, CAPILLARY
Glucose-Capillary: 68 mg/dL — ABNORMAL LOW (ref 70–99)
Glucose-Capillary: 67 mg/dL — ABNORMAL LOW (ref 70–99)
Glucose-Capillary: 118 mg/dL — ABNORMAL HIGH (ref 70–99)
Glucose-Capillary: 89 mg/dL (ref 70–99)

## 2010-07-09 LAB — BASIC METABOLIC PANEL
Calcium: 9.2 mg/dL (ref 8.4–10.5)
Chloride: 102 mEq/L (ref 96–112)
Creatinine, Ser: 0.56 mg/dL (ref 0.4–1.2)
GFR calc Af Amer: >60 mL/min (ref >60)
GFR calc non Af Amer: >60 mL/min (ref >60)
GFR calc non Af Amer: >60 mL/min (ref >60)
Glucose, Bld: 64 mg/dL — ABNORMAL LOW (ref 70–99)
Potassium: 4.7 mEq/L (ref 3.5–5.1)
Sodium: 143 mEq/L (ref 135–145)
Sodium: 138 mEq/L (ref 135–145)

## 2010-07-09 LAB — CARDIAC PANEL(CRET KIN+CKTOT+MB+TROPI)
CK, MB: 1.4 ng/mL (ref 0.3–4.0)
Relative Index: INVALID (ref 0.0–2.5)
Relative Index: INVALID (ref 0.0–2.5)
Total CK: 32 U/L (ref 7–177)
Total CK: 31 U/L (ref 7–177)
Troponin I: <0.3 ng/mL (ref <0.30)
Troponin I: <0.3 ng/mL (ref <0.30)

## 2010-07-09 LAB — MAGNESIUM: Magnesium: 2.1 mg/dL (ref 1.5–2.5)

## 2010-07-09 LAB — LIPID PANEL
LDL Cholesterol: 188 mg/dL — ABNORMAL HIGH (ref 0–99)
Triglycerides: 98 mg/dL (ref <150)
VLDL: 20 mg/dL (ref 0–40)

## 2010-07-09 NOTE — Progress Notes (Signed)
This encounter was created in error - please disregard.

## 2010-07-10 LAB — BASIC METABOLIC PANEL
BUN: 10 mg/dL (ref 6–23)
Calcium: 9.4 mg/dL (ref 8.4–10.5)
Chloride: 98 mEq/L (ref 96–112)
Creatinine, Ser: 0.6 mg/dL (ref 0.4–1.2)
GFR calc non Af Amer: >60 mL/min (ref >60)
Glucose, Bld: 116 mg/dL — ABNORMAL HIGH (ref 70–99)
Sodium: 134 mEq/L — ABNORMAL LOW (ref 135–145)

## 2010-07-10 LAB — CBC
HCT: 37 % (ref 36.0–46.0)
Hemoglobin: 11.9 g/dL — ABNORMAL LOW (ref 12.0–15.0)
MCH: 25.2 pg — ABNORMAL LOW (ref 26.0–34.0)
MCHC: 32.2 g/dL (ref 30.0–36.0)
MCV: 78.2 fL (ref 78.0–100.0)
RBC: 4.73 MIL/uL (ref 3.87–5.11)
RDW: 14.7 % (ref 11.5–15.5)

## 2010-07-10 LAB — GLUCOSE, CAPILLARY
Glucose-Capillary: 125 mg/dL — ABNORMAL HIGH (ref 70–99)
Glucose-Capillary: 62 mg/dL — ABNORMAL LOW (ref 70–99)

## 2010-07-10 LAB — TSH: TSH: 1.436 u[IU]/mL (ref 0.350–4.500)

## 2010-07-11 ENCOUNTER — Telehealth: Payer: Self-pay | Admitting: Internal Medicine

## 2010-07-11 LAB — GLUCOSE, CAPILLARY
Glucose-Capillary: 62 mg/dL — ABNORMAL LOW (ref 70–99)
Glucose-Capillary: 99 mg/dL (ref 70–99)
Glucose-Capillary: 89 mg/dL (ref 70–99)

## 2010-07-11 LAB — BASIC METABOLIC PANEL
BUN: 12 mg/dL (ref 6–23)
CO2: 31 mEq/L (ref 19–32)
Calcium: 9.5 mg/dL (ref 8.4–10.5)
GFR calc non Af Amer: >60 mL/min (ref >60)
Glucose, Bld: 74 mg/dL (ref 70–99)

## 2010-07-11 MED ORDER — LISINOPRIL-HYDROCHLOROTHIAZIDE 20-25 MG PO TABS: 1.0000 | ORAL_TABLET | Freq: Every day | ORAL | Status: DC

## 2010-07-11 MED ORDER — CARVEDILOL 3.125 MG PO TABS: 3.1250 mg | ORAL_TABLET | Freq: Two times a day (BID) | ORAL | Status: DC

## 2010-07-11 MED ORDER — HYDROCODONE-ACETAMINOPHEN 10-500 MG PO TABS: 1.0000 | ORAL_TABLET | Freq: Four times a day (QID) | ORAL | Status: DC | PRN

## 2010-07-11 MED ORDER — OXYCODONE-ACETAMINOPHEN 7.5-500 MG PO TABS: 1.0000 | ORAL_TABLET | Freq: Three times a day (TID) | ORAL | Status: DC | PRN

## 2010-07-11 MED ORDER — PRAVASTATIN SODIUM 40 MG PO TABS: 40.0000 mg | ORAL_TABLET | Freq: Every day | ORAL | Status: DC

## 2010-07-11 NOTE — Telephone Encounter (Addendum)
Pt's daughter called at [facility] that her mom's 4 rx's didn't have DEA number and pharmacy wouldn't fill. I sent three electronically.  The narcotic cannot be called in. I left at the ER desk for daughter to pick up (told to bring old Rx). The meds were not filled in [facility] so I do not know the quantity that the original Rx was for but I gave 30 with no refills.

## 2010-07-11 NOTE — Telephone Encounter (Signed)
Patient's daughter called in saying that her mom was discharged earlier today and was given 90 tabs of percocet by Dr Narda Bonds but she did not put her DEA number and so she was not able to get that prescription. She called back and talked to Dr Rogelia Boga who left her another prescription for 30 pills at the ER desk but she says that she did not sign the prescription and so they were not able to get the prescription for 30 pills either. She was frustated and unable to come back to ED at this time as she was in [address]. I told her that the only way out is to prescribe her vicodin over the phone 10 mg 30 pills which will last about 10 days and in the mean time [facility] will have her percocet prescription ready for pick up. She agreed to the plan. I will call in 30 pills of vicodin for her. Talked to the pharmacist and confirmed the story that she was telling is truth.

## 2010-07-14 NOTE — Telephone Encounter (Signed)
Would someone pls call daughter and express my upmost apologies. I took a pen with me to the clinic to sign the Rx but apparently I forgot to sign.

## 2010-07-15 NOTE — Telephone Encounter (Signed)
i called and spoke w/ daughter, she was very nice and appreciative that someone called and apologized.

## 2010-07-21 ENCOUNTER — Encounter: Payer: Self-pay | Admitting: Internal Medicine

## 2010-07-21 ENCOUNTER — Telehealth: Payer: Self-pay | Admitting: *Deleted

## 2010-07-21 ENCOUNTER — Ambulatory Visit (INDEPENDENT_AMBULATORY_CARE_PROVIDER_SITE_OTHER): Payer: PRIVATE HEALTH INSURANCE | Admitting: Internal Medicine

## 2010-07-21 VITALS — BP 94/60 | HR 93 | Temp 97.3°F | Ht 64.0 in | Wt 170.0 lb

## 2010-07-21 DIAGNOSIS — D329 Benign neoplasm of meninges, unspecified: Secondary | ICD-10-CM

## 2010-07-21 DIAGNOSIS — Z86011 Personal history of benign neoplasm of the brain: Secondary | ICD-10-CM

## 2010-07-21 DIAGNOSIS — D320 Benign neoplasm of cerebral meninges: Secondary | ICD-10-CM

## 2010-07-21 DIAGNOSIS — E785 Hyperlipidemia, unspecified: Secondary | ICD-10-CM

## 2010-07-21 DIAGNOSIS — R918 Other nonspecific abnormal finding of lung field: Secondary | ICD-10-CM

## 2010-07-21 DIAGNOSIS — Z8679 Personal history of other diseases of the circulatory system: Secondary | ICD-10-CM

## 2010-07-21 DIAGNOSIS — M25519 Pain in unspecified shoulder: Secondary | ICD-10-CM

## 2010-07-21 DIAGNOSIS — E119 Type 2 diabetes mellitus without complications: Secondary | ICD-10-CM

## 2010-07-21 DIAGNOSIS — I10 Essential (primary) hypertension: Secondary | ICD-10-CM

## 2010-07-21 DIAGNOSIS — M25511 Pain in right shoulder: Secondary | ICD-10-CM

## 2010-07-21 DIAGNOSIS — R222 Localized swelling, mass and lump, trunk: Secondary | ICD-10-CM

## 2010-07-21 HISTORY — DX: Personal history of benign neoplasm of the brain: Z86.011

## 2010-07-21 HISTORY — DX: Benign neoplasm of meninges, unspecified: D32.9

## 2010-07-21 LAB — BASIC METABOLIC PANEL
BUN: 48 mg/dL — ABNORMAL HIGH (ref 6–23)
Creat: 1.02 mg/dL (ref 0.50–1.10)
Glucose, Bld: 185 mg/dL — ABNORMAL HIGH (ref 70–99)

## 2010-07-21 MED ORDER — OXYCODONE-ACETAMINOPHEN 7.5-500 MG PO TABS: 1.0000 | ORAL_TABLET | Freq: Three times a day (TID) | ORAL | Status: DC | PRN

## 2010-07-21 MED ORDER — HYDROCHLOROTHIAZIDE 12.5 MG PO TABS: 12.5000 mg | ORAL_TABLET | Freq: Every day | ORAL | Status: DC

## 2010-07-21 MED ORDER — LISINOPRIL 20 MG PO TABS: 20.0000 mg | ORAL_TABLET | Freq: Every day | ORAL | Status: DC

## 2010-07-21 NOTE — Assessment & Plan Note (Signed)
This is an incidental finding during her hospitalization. Recommendation at that time from neurology with the patient need a six-month followup MRI. He should be scheduled for sometime in November or December of 2012. Patient endorses some vision changes though she attributes this to her cataract to be removed shortly. No new headaches or neurologic findings

## 2010-07-21 NOTE — Assessment & Plan Note (Signed)
Patient will need repeat fasting lipid panel in 2 months. For now she is to continue pravastatin. She will also need liver function tests at that time.

## 2010-07-21 NOTE — Assessment & Plan Note (Signed)
Patient is being seen for hospital followup following a TIA. Before the hospitalization the patient was noncompliant with aspirin. Therefore she was not considered an aspirin failure. She reports compliance with aspirin. Her daughter is helping her with her medicines. None of her symptoms have returned.  For now we're trying to manage her risk factors including blood pressure and diabetes.

## 2010-07-21 NOTE — Patient Instructions (Signed)
You will be set up with an appointment to see [name], our diabetes educator. You have been given refill of your pain medication. You have been given a referral to Dr. Edwyna Shell to evaluate your lung mass. Today you will have lab work - if there are any abnormalities, I will phone you. I would like for you to return in 2-3 weeks. Stop the combination lisinopril/HCTZ blood pressure pill.  Take instead 1 lisinopril pill and 1 HCTZ pill.

## 2010-07-21 NOTE — Telephone Encounter (Signed)
Call from Wilson from [facility] pt has been discharged from PT and Nursing from [facility].  Pt refused to have OT done.  Morrie Sheldon wanted pt's PCP to be made aware of.

## 2010-07-21 NOTE — Assessment & Plan Note (Signed)
I will refill her chronic Percocet however I believe that she may benefit from repeat referral to orthopedics as she states she never actually got an appointment for them during previous referral. I deferred this at this time given that she has multiple other problems.

## 2010-07-21 NOTE — Assessment & Plan Note (Signed)
Patient is being seen for hospital followup.  Patient is orthostatic but only minimally symptomatic today. Given that she had a just as her blood pressure medication including discontinuation of amlodipine and starting of beta blocker during her last hospitalization I will start by taking one thing at a time. I will change her from a combination pill to 2 separate blood pressure medications. Her daughters amenable to this. I will keep her lisinopril dose at 20 mg a day and decrease hydrochlorothiazide to 12.5 mg a day.  I will have her come back in 2 weeks for blood pressure check followup. Today she will be to [facility] t to check her electrolytes given her regimen changed in the hospital.

## 2010-07-21 NOTE — Assessment & Plan Note (Signed)
Last HgbA1c 8.6%.  She will be due for repeat hemoglobin A1c in 2 months. Will refer patient to [facility] - per the daughter they will do this at another time as they're running late for another appointment. They're to continue taking Lantus in the evenings. When they come to see Dr. Victory Dakin they were told to bring her meter at that time it may be appropriate to restart the patient sliding-scale insulin. I however do not have enough information to restart it at this time.

## 2010-07-21 NOTE — Assessment & Plan Note (Signed)
The patient has been followed by Dr. Edwyna Shell for this in the past. During the hospitalization she had a chest x-ray which showed interval increase in the size of the chest mass. The patient has a 40-60 pack year smoking history. This is concerning. I will refer her back to Dr. Edwyna Shell so that he may choose either repeat imaging or further intervention. The family is agreeable with this.

## 2010-07-21 NOTE — Progress Notes (Signed)
  Subjective:    Patient ID: [name], female    DOB: 03/06/1933, 77 y.o.   MRN: [id number]  HPI Comments: Pt presents for hospital follow-up after TIA.  Pt states all weakness has resolved.  Pt reports compliance with medicines, including aspirin - she is accompanied by her daughter, with whom the patient lives and who gives her her medicines.  Pt reports some mild dizziness when the nurse checked her BP today, but states she has not noticed any at home since discharge.  Pt does not check her BP at home.  No chest pain or SOB.    Pt complains of tender spot on right mid-breast - occasional, dull, not exacerbated by anything.  Pt states her pain pills helped her when she was in the hospital. Not currently experiencing any at this moment.   Pt is diabetic.  Is only taking PM lantus.  lowest AM CBG 98, highest after a meal CBG 347.  Pt former smoker:  2-3 packs/day for 20 years.  Quit 10 years ago.  They are interested in getting the pulmonary nodule evaluated. Previously seen by Dr. Edwyna Shell.  Otherwise patient states she is doing well.       Review of Systems  Constitutional: Negative for fever, chills, activity change and appetite change.  HENT: Negative for rhinorrhea.        Pt reports decreased vision from cataract (scheduled for surgery eval at end of June)  Eyes: Positive for visual disturbance.  Respiratory: Negative for cough, choking, chest tightness and shortness of breath.   Cardiovascular: Negative for palpitations and leg swelling.  Gastrointestinal: Negative for abdominal distention.  Genitourinary: Negative for dysuria, urgency and frequency.  Neurological: Negative for weakness, numbness and headaches.  Psychiatric/Behavioral: Negative for behavioral problems and confusion.       Objective:   Physical Exam  Constitutional: She is oriented to person, place, and time. No distress.  HENT:  Head: Normocephalic and atraumatic.  Mouth/Throat: No oropharyngeal  exudate.       Mucous membranes moist  Eyes: Conjunctivae and EOM are normal. Pupils are equal, round, and reactive to light.  Neck: Normal range of motion. Neck supple.  Cardiovascular: Normal rate, regular rhythm and intact distal pulses.   No murmur heard. Pulmonary/Chest: Effort normal and breath sounds normal. She has no wheezes.       No masses or tenderness in right breast.  Abdominal: Soft. Bowel sounds are normal. She exhibits no distension and no mass. There is no tenderness. There is no rebound and no guarding.  Musculoskeletal: She exhibits no edema and no tenderness.  Neurological: She is alert and oriented to person, place, and time. No cranial nerve deficit. Coordination normal.  Skin: Skin is warm and dry. No rash noted.  Psychiatric: She has a normal mood and affect. Her behavior is normal. Judgment and thought content normal.          Assessment & Plan:

## 2010-08-05 ENCOUNTER — Ambulatory Visit: Payer: PRIVATE HEALTH INSURANCE | Admitting: Internal Medicine

## 2010-08-13 NOTE — Discharge Summary (Signed)
[name], ROSEKRANS              ACCOUNT NO.:  [id number]  MEDICAL RECORD NO.:  [id number]           PATIENT TYPE:  O  LOCATION:  3007                         FACILITY:  [facility]  PHYSICIAN:  Blanch Media, M.D.DATE OF BIRTH:  05/20/1933  DATE OF ADMISSION:  07/08/2010 DATE OF DISCHARGE:  07/11/2010                              DISCHARGE SUMMARY   DISCHARGE DIAGNOSES: 1. Left-sided weakness, resolved, secondary to transient ischemic     attack. 2. Incidental finding of 2 x 3 x 4 cm right frontal parasagittal     meningioma with mild surrounding edema. Recommended f/u with MRI     brain in the next 6 months. 3. Pulmonary nodule, stable from 2009 chest radiograph.  See below for     details. 4. Hypertension. 5. Type 2 diabetes. 6. Hyperlipidemia. 7. Hypokalemia, resolved. 8. Gastroesophageal reflux disease. 9. History of cerebrovascular accident in 2004. 10.History of diverticulitis. 11.Arthritis.  DISCHARGE MEDICATIONS: 1. Coreg 3.125 mg, take 1 tablet by mouth twice daily. 2. Pravastatin 40 mg tablets, take 1 tablet once a day. 3. Aspirin 81 mg tablets, take 1 tablet by mouth once a day. 4. Citalopram 20 mg tablets, take 1 tablet by mouth once a day. 5. Lisinopril/hydrochlorothiazide 20/25 mg tablets, take 1 tablet by     mouth once a day. 6. Lantus, inject 40 units subcutaneously daily at bedtime. 7. Benadryl 25 mg tablets, take 1 tablet by mouth every 6 hours as     needed for itching. 8. Percocet 7.5/500 mg tablets, take 1 tablet up to 3 times daily as     needed for pain, prescription written to dispense 90 tablets with 0     refills on Jul 11, 2010. 9. NovoLog sliding scale, inject 3-12 units subcutaneously according     to sliding scale.  Please stop taking the following medications, amlodipine 10 mg tablets, take 1 tablet by mouth once daily.  DISPOSITION AND FOLLOWUP:  The patient is to follow up with Dr. Danelle Berry in [facility] on  July 21, 2010.  During this appointment, please note the patient was diagnosed with TIA during her hospitalization, and this was likely secondary to poorly controlled hypertension and diabetes type 2.  She needs strict risk factor modification on outpatient basis.  Please note that her amlodipine was discontinued during hospitalization, and she was started on low dose of Coreg twice a day in addition to her ACE inhibitor and her hydrochlorothiazide.  Please check a BMET during her followup appointment to assess her renal function.  The patient was also started on 40 units of Lantus as opposed to her previous home dose of 65 units during the hospitalization due to episodes of hypoglycemia.  Please refer the patient to [facility] for further diabetes education.  The patient's pravastatin was also increased from 20 units to 40 units and as such we will need LFTs checked on an outpatient basis.  Of note during her hospitalization, she was found to be hypokalemic.  This is secondary to poor oral intake and as such we will need BMET repeated on her followup appointment.  PROCEDURES PERFORMED: 1.  Chest x-ray on Jul 08, 2010, showed spiculated mass, right upper     lobe, appears minimally denser/larger compared to a chest     radiograph of 2009.  A low-grade primary bronchogenic carcinoma     cannot be excluded as described on prior chest CT.  Consider     continue followup with Pulmonology or Thoracic Surgery.  Otherwise,     no acute pulmonary disease. 2. CT head without contrast performed on Jul 08, 2010, showed no     evidence of acute infarction wether of encephalomalacic changes     from prior right frontal and right basilar ganglia infarct.  MRI of     brain on Jul 09, 2010, showed roughly 2 x 3 x 4 cm right frontal     parasagittal meningioma with mild surrounding edema.  Atrophy and     small vessel disease with right hemisphere infarct as described.     No acute infarct or hemorrhage  observed. 3. An MRA head without contrast on October 09, 2010, showed     intracranial atherosclerotic disease without proximal flow reducing     lesion or intracranial berry aneurysm.  CONSULTATIONS:  None.  BRIEF ADMISSION HISTORY AND PHYSICAL:  [name] is a 77 year old woman with poorly controlled diabetes, hypertension, and stroke in 2004 presenting with bilateral lower extremity weakness.  The patient noted bilateral weakness suddenly in her lower extremities from sometime between 10:30 and 11:00 a.m. on presentation.  The patient could not walk and had to lean over the sofa support, but eventually her legs gave out on her.  However, the patient did not fall or did she lose consciousness.  Subsequent to that, she also experienced lower speech. During this period, the patient denied having any headache, visual changes, dizziness, chest pain, palpitations, shortness of breath, nausea, or vomiting.  EMS was called by her daughter.  However, en route to the ER, the patient developed left-sided facial droop and left-sided weakness, both of which resolved on arrival to the ER.  PHYSICAL EXAMINATION:  INITIAL VITAL SIGNS:  Temperature 98.2, pulse 76, blood pressure 152/132, respirations 16, oxygen saturation 99% on room air. GENERAL APPEARANCE:  Alert, cooperative, appears stated age, in no acute distress. EYES:  Conjunctivae and corneas clear.  PERRLA.  EOMI.  Fundi benign. Throat, lips, mucosa, and tongue are normal.  Teeth and gums are normal. LUNGS:  Clear to auscultation bilaterally. HEART:  Irregularly irregular rhythm. ABDOMEN:  Soft and nontender.  Bowel sounds normal.  No masses or organomegaly. EXTREMITIES:  Extremities normal.  Atraumatic.  No cyanosis or edema. Pulses are 2+ and symmetric. NEUROLOGIC:  Cranial nerves normal.  Sensory normal.  Motor, 4/5 lower extremity strength, 5/5 in the right lower extremity and upper extremity bilaterally.  Reflexes 2+ and  symmetrical.  Coordination:  Finger-to- nose normal bilaterally.  Heel-to-shin normal bilaterally.  INITIAL LABORATORY DATA:  White blood cell 6.6, hemoglobin 12.6, platelets 378.  Sodium 144, potassium 3, chloride 102, bicarb 32, BUN 10, creatinine 0.7, blood glucose 83.  Initial point-of-care cardiac markers negative.  Urinalysis is negative.  HOSPITAL COURSE BY PROBLEM: 1. Left-sided weakness secondary to transient ischemic attack.  This     is likely secondary to poorly controlled hypertension and possibly     type 2 diabetes.  Further workup to evaluate for other possible     etiologies were negative:  Ruled out ACS and negative cardiac     markers as well as no concerning EKG changes.  There  were no     arrhythmias observed on telemetry, and 2-D echo was negative for     any potential source of an embolus, carotid Dopplers were also     without any significant stenosis and her vertebral arteries were     within patent flow.  However, of note, the patient's 2-D echo was     significant for significantly reduced ejection fraction of 15-20%     as well as global hypokinesis, and the patient would subsequently     need referral to Cardiology on an outpatient basis for further     monitoring of her heart failure.  In the meantime, the patient was     continued on her aspirin at 81 mg daily (please note the patient     did not present with failure of aspirin therapy as she was not     compliant with her aspirin therapy on outpatient basis and so was not     started on Plavix).  The patient also requires strict risk factor     modification by tighter control of her have low of her hypertension     and diabetes on an outpatient basis. 2. Incidental finding of a meningioma.  During the patient's stroke     workup, CT scan was negative for acute infarct and MRA was also     negative for an acute infarct.  However, the MRI did show an     incidental finding of 2 x 3 x 4 cm right frontal  parasagittal     meningioma with mild surrounding edema.  There was no mass effect     or midline shift.  Neurology was consulted and recommended no     emergent intervention, but for the patient follow up in 6 months     for repeated imaging study. 3. Pulmonary nodule.  The patient's admission chest x-ray showed     presence of a secluded mass in her right upper lobe that was      minimally larger when compared to a chest     radiograph of 2009.  A pathology report after her bronchoscopy at     that time revealed only benign changes.  With this finding, it was     discussed with  the patient, and they do not want to pursue any     aggressive intervention at this time.  Again, these discussions can     be continued to be held with the patient on an outpatient basis. 4. Hypertension.  The patient did present with the hospital with     poorly controlled blood pressure with diastolics in the 130s.     Given her recent finding of heart failure, her amlodipine was     discontinued due to risk of fluid retention and instead she was     continued on her ACE inhibitor as well as hydrochlorothiazide.     Beta-blocker was also added to her medication regimen for proper     blood pressure control as well as for overall heart health.  This     was explained to the patient and importance of proper medication     compliance with this patient in order to prevent future strokes. 5. Type 2 diabetes.  A1c of 8.2.  During hospitalization, the patient     was initially started on a reduced dose of her Lantus 50 units     nightly.  However, she tended to get hypoglycemic, and  so her Lantus was subsequently reduced to 40 units nightly, which     was adequate in controlling her blood sugars.  This dose should be     continued on outpatient basis, and the patient will need referral     to [facility] Neighbor of the outpatient diabetes educator for proper and     continued education. 6. Hyperlipidemia.  The  patient's lipid panel was checked showing an     LDL of 188 such given her lipids are not well controlled on her     chronic dose of pravastatin at 20 mg daily.  This medication was     increased to 40 mg daily.  For this, her LFTs will need to be     rechecked on her hospital followup appointment in the clinic. 7. Hypokalemia.  The patient presented to the hospital with potassium     low at 3.0.  This was thought to be secondary to poor p.o. intake     as well as volume depletion as the patient was not having any GI     losses from vomiting or diarrhea.  She was adequately repleted     during her hospitalization, and her numbers trended to normal.     However, since the patient is being restarted on her ACE inhibitor     as well as hydrochlorothiazide, by her potassium levels will need     to be rechecked on an outpatient basis and should be repleted as     needed.  DISPOSITION:  Ms. Postma is being discharged home with physical therapy per recommendations in stable and improved condition.  Please address the issues as outlined in her hospital course above.  DISCHARGE VITAL SIGNS:  Temperature 98, pulse 92, respirations 18, blood pressure 131/72, oxygen saturation 98% on room air discharge.  LAB DATA:  Sodium 138, potassium 4.4, chloride 100, bicarb 31, blood glucose 74, BUN 12, creatinine 0.59.  TSH 1.436.  Hemoglobin A1c of 8.6. Lipid panel as follows:  Total cholesterol 250, triglycerides 98, HDL 42, LDL 180.    ______________________________ Jaci Lazier, MD   ______________________________ Blanch Media, M.D.    NI/MEDQ  D:  07/11/2010  T:  07/12/2010  Job:  [id number]  cc:   Mliss Sax, MD Danelle Berry, MD  Electronically Signed by Jaci Lazier MD on 07/29/2010 01:38:53 AM Electronically Signed by Blanch Media M.D. on 08/13/2010 11:54:45 AM

## 2010-08-18 ENCOUNTER — Other Ambulatory Visit: Payer: Self-pay | Admitting: Internal Medicine

## 2010-09-04 ENCOUNTER — Other Ambulatory Visit: Payer: Self-pay | Admitting: Thoracic Surgery

## 2010-09-04 DIAGNOSIS — R918 Other nonspecific abnormal finding of lung field: Secondary | ICD-10-CM

## 2010-09-05 ENCOUNTER — Ambulatory Visit: Payer: Medicaid Other | Admitting: Thoracic Surgery

## 2010-09-05 ENCOUNTER — Inpatient Hospital Stay: Admission: RE | Admit: 2010-09-05 | Payer: Medicaid Other | Source: Ambulatory Visit

## 2010-09-05 ENCOUNTER — Encounter: Payer: Self-pay | Admitting: Internal Medicine

## 2010-09-05 ENCOUNTER — Ambulatory Visit (INDEPENDENT_AMBULATORY_CARE_PROVIDER_SITE_OTHER): Payer: Medicaid Other | Admitting: Internal Medicine

## 2010-09-05 VITALS — BP 92/58 | HR 80 | Temp 97.0°F | Ht 64.0 in | Wt 170.0 lb

## 2010-09-05 DIAGNOSIS — I10 Essential (primary) hypertension: Secondary | ICD-10-CM

## 2010-09-05 DIAGNOSIS — M25519 Pain in unspecified shoulder: Secondary | ICD-10-CM

## 2010-09-05 DIAGNOSIS — E119 Type 2 diabetes mellitus without complications: Secondary | ICD-10-CM

## 2010-09-05 DIAGNOSIS — E785 Hyperlipidemia, unspecified: Secondary | ICD-10-CM

## 2010-09-05 DIAGNOSIS — M25511 Pain in right shoulder: Secondary | ICD-10-CM

## 2010-09-05 LAB — POCT GLYCOSYLATED HEMOGLOBIN (HGB A1C): Hemoglobin A1C: 7.8

## 2010-09-05 LAB — GLUCOSE, CAPILLARY: Glucose-Capillary: 192 mg/dL — ABNORMAL HIGH (ref 70–99)

## 2010-09-05 MED ORDER — OXYCODONE-ACETAMINOPHEN 7.5-500 MG PO TABS: 1.0000 | ORAL_TABLET | Freq: Three times a day (TID) | ORAL | Status: DC | PRN

## 2010-09-05 NOTE — Progress Notes (Signed)
  Subjective:    Patient ID: [name], female    DOB: 02/03/1934, 77 y.o.   MRN: [id number]  HPI 77 yo female with PMH outlined below who presents to [facility] for regular follow up on her BP, diabetes, cholesterol control. She reports compliance with her medications for blood pressure diabetes and cholesterol. She denies recent sicknesses or hospitalizations. No episodes of chest pain or shortness of breath, no fevers or chills. She denies any abdominal or urinary concerns at this time.   Review of Systems Constitutional: Denies fever, chills, diaphoresis, appetite change and fatigue.  HEENT: Denies photophobia, eye pain, redness, hearing loss, ear pain, congestion, sore throat, rhinorrhea, sneezing, mouth sores, trouble swallowing, neck pain, neck stiffness and tinnitus.   Respiratory: Denies SOB, DOE, cough, chest tightness,  and wheezing.   Cardiovascular: Denies chest pain, palpitations and leg swelling.  Gastrointestinal: Denies nausea, vomiting, abdominal pain, diarrhea, constipation, blood in stool and abdominal distention.  Genitourinary: Denies dysuria, urgency, frequency, hematuria, flank pain and difficulty urinating.  Musculoskeletal: Denies myalgias, back pain, joint swelling, arthralgias and gait problem.  Skin: Denies pallor, rash and wound.  Neurological: Denies dizziness, seizures, syncope, weakness, light-headedness, numbness and headaches.  Hematological: Denies adenopathy. Easy bruising, personal or family bleeding history  Psychiatric/Behavioral: Denies suicidal ideation, mood changes, confusion, nervousness, sleep disturbance and agitation      Objective:   Physical Exam    Constitutional: Vital signs reviewed.  Patient is a well-developed and well-nourished in no acute distress and cooperative with exam. Alert and oriented x3.  Neck: Supple, Trachea midline normal ROM, No JVD, mass, thyromegaly, or carotid bruit present.  Cardiovascular: RRR, S1 normal, S2  normal, no MRG, pulses symmetric and intact bilaterally Pulmonary/Chest: CTAB, no wheezes, rales, or rhonchi Abdominal: Soft. Non-tender, non-distended, bowel sounds are normal, no masses, organomegaly, or guarding present.  Hematology: no cervical, inginal, or axillary adenopathy.  Neurological: A&O x3, Strenght is normal and symmetric bilaterally, cranial nerve II-XII are grossly intact, no focal motor deficit, sensory intact to light touch bilaterally.  Skin: Warm, dry and intact. No rash, cyanosis, or clubbing.  Psychiatric: Normal mood and affect. speech and behavior is normal. Judgment and thought content normal. Cognition and memory are normal.       Assessment & Plan:

## 2010-09-05 NOTE — Assessment & Plan Note (Signed)
A1c is 7.8 and patient reports compliance with her insulin. I have examined her feet today and the results are within normal limits, no ulcerations and no sensory loss. Patient is up-to-date on her yearly ophthalmology examinations. I have discussed with her continuing current Lantus and NovoLog injections. We'll continue the same medication regimen for now and we'll make no changes today.

## 2010-09-05 NOTE — Assessment & Plan Note (Signed)
Blood pressure is on the lower range of normal. I will HCTZ for now. I have discussed with patient that if her blood pressure is persistently less than 110/70 he can most likely discontinue HCTZ and monitor blood pressure on lisinopril and Coreg. Patient was advised to continue checking her blood pressure at home regularly

## 2010-09-05 NOTE — Assessment & Plan Note (Signed)
Fasting lipid panel was checked 2 months ago and it indicated worsening cholesterol level. Patient is reporting compliance with pravastatin and we will reassess fasting lipid panel in 6 months.

## 2010-09-08 ENCOUNTER — Other Ambulatory Visit: Payer: Medicaid Other

## 2010-09-08 ENCOUNTER — Ambulatory Visit: Payer: Medicaid Other | Admitting: Thoracic Surgery

## 2010-10-08 ENCOUNTER — Other Ambulatory Visit: Payer: Self-pay | Admitting: *Deleted

## 2010-10-08 DIAGNOSIS — I10 Essential (primary) hypertension: Secondary | ICD-10-CM

## 2010-10-08 DIAGNOSIS — E785 Hyperlipidemia, unspecified: Secondary | ICD-10-CM

## 2010-10-08 DIAGNOSIS — M25511 Pain in right shoulder: Secondary | ICD-10-CM

## 2010-10-08 DIAGNOSIS — E119 Type 2 diabetes mellitus without complications: Secondary | ICD-10-CM

## 2010-10-08 MED ORDER — OXYCODONE-ACETAMINOPHEN 7.5-500 MG PO TABS: 1.0000 | ORAL_TABLET | Freq: Three times a day (TID) | ORAL | Status: DC | PRN

## 2010-11-06 ENCOUNTER — Other Ambulatory Visit: Payer: Self-pay | Admitting: *Deleted

## 2010-11-06 DIAGNOSIS — E119 Type 2 diabetes mellitus without complications: Secondary | ICD-10-CM

## 2010-11-06 DIAGNOSIS — I10 Essential (primary) hypertension: Secondary | ICD-10-CM

## 2010-11-06 DIAGNOSIS — M25511 Pain in right shoulder: Secondary | ICD-10-CM

## 2010-11-06 DIAGNOSIS — E785 Hyperlipidemia, unspecified: Secondary | ICD-10-CM

## 2010-11-06 MED ORDER — OXYCODONE-ACETAMINOPHEN 7.5-500 MG PO TABS: 1.0000 | ORAL_TABLET | Freq: Three times a day (TID) | ORAL | Status: DC | PRN

## 2010-11-09 ENCOUNTER — Emergency Department (HOSPITAL_COMMUNITY): Payer: PRIVATE HEALTH INSURANCE

## 2010-11-09 ENCOUNTER — Emergency Department (HOSPITAL_COMMUNITY)
Admission: EM | Admit: 2010-11-09 | Discharge: 2010-11-10 | Disposition: A | Discharge status: Home / Self Care | Payer: PRIVATE HEALTH INSURANCE | Source: Home / Self Care | Attending: Emergency Medicine | Admitting: Emergency Medicine

## 2010-11-09 ENCOUNTER — Encounter: Payer: Self-pay | Admitting: Internal Medicine

## 2010-11-09 LAB — DIFFERENTIAL
Basophils Absolute: 0 10*3/uL (ref 0.0–0.1)
Basophils Relative: 0 % (ref 0–1)
Eosinophils Relative: 1 % (ref 0–5)
Monocytes Absolute: 0.4 10*3/uL (ref 0.1–1.0)
Neutro Abs: 5.1 10*3/uL (ref 1.7–7.7)

## 2010-11-09 LAB — COMPREHENSIVE METABOLIC PANEL
AST: 13 U/L (ref 0–37)
Albumin: 4.5 g/dL (ref 3.5–5.2)
Calcium: 9.8 mg/dL (ref 8.4–10.5)
Creatinine, Ser: 0.49 mg/dL — ABNORMAL LOW (ref 0.50–1.10)

## 2010-11-09 LAB — URINALYSIS, ROUTINE W REFLEX MICROSCOPIC
Glucose, UA: NEGATIVE mg/dL
Leukocytes, UA: NEGATIVE
Protein, ur: 30 mg/dL — AB
pH: 6 (ref 5.0–8.0)

## 2010-11-09 LAB — URINE MICROSCOPIC-ADD ON

## 2010-11-09 LAB — CBC
Hemoglobin: 13.1 g/dL (ref 12.0–15.0)
MCHC: 31.6 g/dL (ref 30.0–36.0)
Platelets: 380 10*3/uL (ref 150–400)
RDW: 15.1 % (ref 11.5–15.5)

## 2010-11-09 LAB — PROTIME-INR
INR: 1 (ref 0.00–1.49)
Prothrombin Time: 13.4 seconds (ref 11.6–15.2)

## 2010-11-09 NOTE — Progress Notes (Signed)
Patient's daughter called and stated that patient has been having diarrhea in the past 3 days with associated abdominal pain, nausea and vomiting.    Patient does have an appointment in clinic tomorrow.  I instructed daughter to bring patient to the ED for further evaluation and treatment since our office is closed at this time.  Daughter stated that patient does not want to go to the ED.  Encourage increase in po fluids and continue to hydrate herself until further evaluation.

## 2010-11-09 NOTE — H&P (Incomplete)
Hospital Admission Note Date: 11/09/2010  Patient name: [name] Medical record number: [id number] Date of birth: Oct 25, 1933 Age: 77 y.o. Gender: female PCP: Almyra Deforest, MD  Medical Service:  General Medicine Teaching Service  Attending physician:   Dr. Anderson Malta   Resident [phone]):   Dr. Carrolyn Meiers  Pager:   [phone] Resident (R1):   Dr. Dede Query   Pager:   [phone]  Chief Complaint: Nausea and Vomiting  History of Present Illness:   Meds: Current Outpatient Prescriptions  Medication Sig Dispense Refill  . aspirin 81 MG tablet Take 81 mg by mouth daily.        . carvedilol (COREG) 3.125 MG tablet Take 1 tablet (3.125 mg total) by mouth 2 (two) times daily with a meal.  60 tablet  5  . citalopram (CELEXA) 20 MG tablet TAKE 1 TABLET BY MOUTH AT BEDTIME  30 tablet  2  . glucose blood (FREESTYLE LITE) test strip Use to test blood glucose three times a day. Dx: 250.00       . hydrochlorothiazide (HYDRODIURIL) 12.5 MG tablet Take 1 tablet (12.5 mg total) by mouth daily.  30 tablet  11  . insulin aspart (NOVOLOG FLEXPEN) 100 UNIT/ML injection Inject 5 Units into the skin. 15 minutes before your lunch and dinner.      . insulin glargine (LANTUS SOLOSTAR) 100 UNIT/ML injection Inject 40 Units into the skin at bedtime.       . Insulin Pen Needle (PEN NEEDLES) 31G X 8 MM MISC Use to inject insulin 4 times daily.      . Lancets MISC Use to test blood glucose three times a day. Dx: 250.00        . lisinopril (PRINIVIL,ZESTRIL) 20 MG tablet Take 1 tablet (20 mg total) by mouth daily.  30 tablet  11  . oxyCODONE-acetaminophen (PERCOCET) 7.5-500 MG per tablet Take 1 tablet by mouth 3 (three) times daily as needed for pain.  90 tablet  0  . pravastatin (PRAVACHOL) 40 MG tablet Take 1 tablet (40 mg total) by mouth at bedtime.  30 tablet  5    Allergies: Review of patient's allergies indicates not on file.   Past Medical History  Diagnosis Date  . Diabetes mellitus 2007    HgA1C  (02/20/2010) = 9.2, HgA1C (03/20/2009) = 12.1  . Hyperlipidemia   . Hypertension   . GERD (gastroesophageal reflux disease)   . CVA (cerebrovascular accident) 2006     right embolic stroke, no residual deficits  . Diverticulitis   . CVA (cerebral infarction) 7-yrs ago  . Arthritis   . Dysphagia    Past Surgical History  Procedure Date  . Abdominal hysterectomy    Family History  Problem Relation Age of Onset  . Hyperlipidemia Brother   . Hypertension Brother   . Diabetes Brother    History   Social History  . Marital Status: Widowed   Social History Main Topics  . Smoking status: Former Games developer  . Smokeless tobacco: Not on file  . Alcohol Use: No  . Drug Use: No  . Sexually Active:    Other Topics Concern  . Not on file   Social History Narrative   Patient requests to use [facility] for her diabets testing supplies as of 09/05/2009.    Review of Systems: See HPI  Physical Exam:   Lab results: Basic Metabolic Panel:  Basename 11/09/10 2120  NA 138  K 3.4*  CL 97  CO2 28  GLUCOSE 173*  BUN 10  CREATININE 0.49*  CALCIUM 9.8  MG --  PHOS --   Liver Function Tests:  [facility] 11/09/10 2120  AST 13  ALT 12  ALKPHOS 69  BILITOT 0.5  PROT 8.6*  ALBUMIN 4.5    Basename 11/09/10 2120  LIPASE 21  AMYLASE --   CBC:  Basename 11/09/10 2120  WBC 6.6  NEUTROABS 5.1  HGB 13.1  HCT 41.4  MCV 83.0  PLT 380    Protime ( Prothrombin Time)            13.4              11.6-15.2        seconds INR                                        1.00              0.00-1.49   Urinalysis: Result Name                               Result     Abnl   Normal Range     Units      Perf. Loc.  Color, Urine                              YELLOW            YELLOW  Appearance                                CLOUDY     a      CLEAR  Specific Gravity                          1.013             1.005-1.030  pH                                         6.0                5.0-8.0  Urine Glucose                             NEGATIVE          NEG              mg/dL  Bilirubin                                  NEGATIVE          NEG  Ketones                                   40         a      NEG              mg/dL  Blood  NEGATIVE          NEG  Protein                                    30         a      NEG              mg/dL  Urobilinogen                              0.2               0.0-1.0          mg/dL  Nitrite                                    NEGATIVE          NEG  Leukocytes                                NEGATIVE          NEG  Squamous Epithelial / LPF                RARE              RARE  WBC / HPF                                 0-2               <3               WBC/hpf  RBC / HPF                                 0-2               <3               RBC/hpf  Bacteria / HPF                            MANY       a      RARE   Imaging results:  Dg Abd Acute W/chest  11/09/2010  *RADIOLOGY REPORT*  Clinical Data: Abdominal pain, nausea and vomiting.  ACUTE ABDOMEN SERIES (ABDOMEN 2 VIEW & CHEST 1 VIEW)  Comparison: Chest radiograph performed 07/08/2010, and PET/CT images performed 12/22/2007  Findings: There is a persistent spiculated mass within the right upper lobe, measuring approximately 3.9 cm in size.  This appears relatively stable from the prior radiograph, and may be minimally increased in size from the prior PET/CT performed in 2009.  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  The lungs are otherwise grossly clear.  No pleural effusion or pneumothorax is seen.  The cardiomediastinal silhouette is borderline enlarged.  There is a relative paucity of bowel gas within the abdomen and pelvis, though air and minimal stool are seen within the colon; no abnormal dilatation  of small bowel loops is  seen to suggest small bowel obstruction.  No free intra-abdominal air is identified on the provided decubitus images.  No acute osseous abnormalities are seen; mild sclerotic change is noted at the sacroiliac joints.  The visualized lung bases are essentially clear.  IMPRESSION:  1.  Relatively stable spiculated mass within the right upper lung lobe, measuring approximately 3.9 cm; this may be minimally increased in size from the prior PET/CT performed at 2009.  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 2.  Relative paucity of bowel gas within the abdomen and pelvis; no evidence for bowel obstruction.  No free intra-abdominal air seen.  Original Report Authenticated By: Tonia Ghent, M.D.    Other results: EKG:   Assessment & Plan by Problem: Active Problems:  * No active hospital problems. *      R2/3______________________________      R1________________________________  ATTENDING: I performed and/or observed a history and physical examination of the patient.  I discussed the case with the residents as noted and reviewed the residents' notes.  I agree with the findings and plan--please refer to the attending physician note for more details.  Signature________________________________  Printed Name_____________________________

## 2010-11-10 ENCOUNTER — Encounter: Payer: Medicare Other | Admitting: Internal Medicine

## 2010-11-10 ENCOUNTER — Inpatient Hospital Stay (HOSPITAL_COMMUNITY)
Admission: EM | Admit: 2010-11-10 | Discharge: 2010-11-23 | DRG: 392 | Disposition: A | Discharge status: Home Health | Payer: PRIVATE HEALTH INSURANCE | Source: Ambulatory Visit | Attending: Internal Medicine | Admitting: Internal Medicine

## 2010-11-10 DIAGNOSIS — M129 Arthropathy, unspecified: Secondary | ICD-10-CM | POA: Diagnosis present

## 2010-11-10 DIAGNOSIS — I10 Essential (primary) hypertension: Secondary | ICD-10-CM | POA: Diagnosis present

## 2010-11-10 DIAGNOSIS — I472 Ventricular tachycardia, unspecified: Secondary | ICD-10-CM | POA: Diagnosis not present

## 2010-11-10 DIAGNOSIS — R911 Solitary pulmonary nodule: Secondary | ICD-10-CM | POA: Diagnosis present

## 2010-11-10 DIAGNOSIS — F329 Major depressive disorder, single episode, unspecified: Secondary | ICD-10-CM | POA: Diagnosis present

## 2010-11-10 DIAGNOSIS — Z79899 Other long term (current) drug therapy: Secondary | ICD-10-CM

## 2010-11-10 DIAGNOSIS — E785 Hyperlipidemia, unspecified: Secondary | ICD-10-CM | POA: Diagnosis present

## 2010-11-10 DIAGNOSIS — Z794 Long term (current) use of insulin: Secondary | ICD-10-CM

## 2010-11-10 DIAGNOSIS — I509 Heart failure, unspecified: Secondary | ICD-10-CM | POA: Diagnosis present

## 2010-11-10 DIAGNOSIS — C3490 Malignant neoplasm of unspecified part of unspecified bronchus or lung: Secondary | ICD-10-CM | POA: Diagnosis present

## 2010-11-10 DIAGNOSIS — I428 Other cardiomyopathies: Secondary | ICD-10-CM | POA: Diagnosis present

## 2010-11-10 DIAGNOSIS — I4729 Other ventricular tachycardia: Secondary | ICD-10-CM | POA: Diagnosis not present

## 2010-11-10 DIAGNOSIS — Z7982 Long term (current) use of aspirin: Secondary | ICD-10-CM

## 2010-11-10 DIAGNOSIS — R1310 Dysphagia, unspecified: Secondary | ICD-10-CM | POA: Diagnosis present

## 2010-11-10 DIAGNOSIS — I5022 Chronic systolic (congestive) heart failure: Secondary | ICD-10-CM | POA: Diagnosis present

## 2010-11-10 DIAGNOSIS — I951 Orthostatic hypotension: Secondary | ICD-10-CM | POA: Diagnosis not present

## 2010-11-10 DIAGNOSIS — E119 Type 2 diabetes mellitus without complications: Secondary | ICD-10-CM | POA: Diagnosis present

## 2010-11-10 DIAGNOSIS — R5381 Other malaise: Secondary | ICD-10-CM | POA: Diagnosis present

## 2010-11-10 DIAGNOSIS — E860 Dehydration: Secondary | ICD-10-CM | POA: Diagnosis present

## 2010-11-10 DIAGNOSIS — D649 Anemia, unspecified: Secondary | ICD-10-CM | POA: Diagnosis not present

## 2010-11-10 DIAGNOSIS — Z8673 Personal history of transient ischemic attack (TIA), and cerebral infarction without residual deficits: Secondary | ICD-10-CM

## 2010-11-10 DIAGNOSIS — E876 Hypokalemia: Secondary | ICD-10-CM | POA: Diagnosis present

## 2010-11-10 DIAGNOSIS — K5289 Other specified noninfective gastroenteritis and colitis: Principal | ICD-10-CM | POA: Diagnosis present

## 2010-11-10 DIAGNOSIS — F3289 Other specified depressive episodes: Secondary | ICD-10-CM | POA: Diagnosis present

## 2010-11-10 DIAGNOSIS — N179 Acute kidney failure, unspecified: Secondary | ICD-10-CM | POA: Diagnosis not present

## 2010-11-10 DIAGNOSIS — K219 Gastro-esophageal reflux disease without esophagitis: Secondary | ICD-10-CM | POA: Diagnosis present

## 2010-11-10 LAB — CARDIAC PANEL(CRET KIN+CKTOT+MB+TROPI)
CK, MB: 2.3 ng/mL (ref 0.3–4.0)
Relative Index: INVALID (ref 0.0–2.5)
Relative Index: INVALID (ref 0.0–2.5)
Total CK: 48 U/L (ref 7–177)
Total CK: 56 U/L (ref 7–177)
Troponin I: <0.3 ng/mL (ref <0.30)

## 2010-11-10 LAB — LIPID PANEL
Cholesterol: 213 mg/dL — ABNORMAL HIGH (ref 0–200)
Triglycerides: 65 mg/dL (ref <150)
VLDL: 13 mg/dL (ref 0–40)

## 2010-11-10 LAB — GLUCOSE, CAPILLARY: Glucose-Capillary: 155 mg/dL — ABNORMAL HIGH (ref 70–99)

## 2010-11-10 LAB — HEMOGLOBIN A1C
Hgb A1c MFr Bld: 6.7 % — ABNORMAL HIGH (ref <5.7)
Mean Plasma Glucose: 146 mg/dL — ABNORMAL HIGH (ref <117)

## 2010-11-10 NOTE — H&P (Signed)
Hospital Admission Note Date: 11/10/2010  Patient name:  [name]  Medical record number:  [id number] Date of birth:  January 13, 1934  Age: 77 y.o. Gender:  female PCP:    Almyra Deforest, MD  Medical Service:   Internal Medicine Teaching Service   Attending physician:  Dr. Josem Kaufmann First Contact:   Dr. Clyde Lundborg             Pager:  Second Contact:   Dr. Dr. Loistine Chance  Pager: [phone] After Hours:    First Contact   Pager: [phone]      Second Contact  Pager: [phone]   Chief Complaint: Vomiting for 1 day  History of Present Illness: Patient is a 77 y.o. female with a PMHx of  has a past medical history of Diabetes mellitus (2007); Hyperlipidemia; Hypertension; GERD (gastroesophageal reflux disease); CVA (cerebrovascular accident) (2006 ); Diverticulitis; CVA (cerebral infarction) (7-yrs ago); Arthritis; and Dysphagia.She presents to [facility] for evaluation of  Nausea and vomiting of 1 day duration. Patient denies any HA, neck pain,fever, chills, vision or speech deficits, paresthesias, weakness, CP, SOB, heart racing, abdominal pain, heartburn, diarrhea, melena, abdominal distension or leg swelling/pain or rashes. She denies eating any suspicious foods or exposure to ill persons. She denies any similar sympotms in the past. She reports compliance with her medication regimen although is unable to name any of them. She states that her daughter administers all her medications.  Current Outpatient Medications: These medications were taken from the last D/C summary note given that the patient was unable to provide any information.  1. Coreg 3.125 mg, take 1 tablet by mouth twice daily.   2. Pravastatin 40 mg tablets, take 1 tablet once a day.   3. Aspirin 81 mg tablets, take 1 tablet by mouth once a day.   4. Citalopram 20 mg tablets, take 1 tablet by mouth once a day.   5. Lisinopril 20 mg tablets, take 1 tablet by       mouth once a day.   6. Lantus, inject 40 units subcutaneously daily at bedtime.   7.  Benadryl 25 mg tablets, take 1 tablet by mouth every 6 hours as       needed for itching.   8. Percocet 7.5/500 mg tablets, take 1 tablet up to 3 times daily as       needed for pain, prescription written to dispense 90 tablets with 0       refills on Jul 11, 2010.   9. NovoLog sliding scale, inject 3-12 units subcutaneously according       to sliding scale.      Allergies: Review of patient's allergies indicates not on file.  Past Medical History: Past Medical History  Diagnosis Date  . Diabetes mellitus 2007    HgA1C (02/20/2010) = 9.2, HgA1C (03/20/2009) = 12.1  . Hyperlipidemia   . Hypertension   . GERD (gastroesophageal reflux disease)   . CVA (cerebrovascular accident) 2006     right embolic stroke, no residual deficits  . Diverticulitis   . CVA (cerebral infarction) 7-yrs ago  . Arthritis   . Dysphagia     Past Surgical History: Past Surgical History  Procedure Date  . Abdominal hysterectomy     Family History: Family History  Problem Relation Age of Onset  . Hyperlipidemia Brother   . Hypertension Brother   . Diabetes Brother     Social History: History   Social History  . Marital Status: Widowed    Spouse Name: N/A  Number of Children: N/A  . Years of Education: N/A   Occupational History  . 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 Lives with her daughter. Has Medicare and Medicaid.   Social History Main Topics  . Smoking status: Former Games developer  . Smokeless tobacco: Not on file  . Alcohol Use: No  . Drug Use: No  . Sexually Active:     Review of Systems: Constitutional: Denies fever, chills, diaphoresis, appetite change and fatigue.  HEENT: Denies photophobia, eye pain, redness, hearing loss, ear pain, congestion, sore throat, rhinorrhea, sneezing, mouth sores, trouble swallowing, neck pain, neck stiffness and tinnitus.  Respiratory: Denies SOB, DOE, cough, chest tightness, and wheezing.  Cardiovascular: Denies chest pain, palpitations and  leg swelling.  Gastrointestinal: Denies  abdominal pain, diarrhea, constipation, blood in stool and abdominal distention.  Genitourinary: Denies dysuria, urgency, frequency, hematuria, flank pain and difficulty urinating.  Musculoskeletal: Denies myalgias, back pain, joint swelling, arthralgias and gait problem.  Skin: Denies pallor, rash and wound.  Neurological: Denies dizziness, seizures, syncope, weakness, light-headedness, numbness and headaches.  Hematological: Denies adenopathy. Easy bruising, personal or family bleeding history  Psychiatric/Behavioral: Denies suicidal ideation, mood changes, confusion, nervousness, sleep disturbance and agitation     Vital Signs: T: 99.1 P: 104 BP: 208/11 RR: 18 O2 sat: 100% RA   Physical Exam: General Appearance:  Alert, cooperative, no distress, appears stated age   Head:  Normocephalic, without obvious abnormality, atraumatic   Eyes:  PERRL, conjunctiva/corneas clear, EOM's intact, fundi  benign, both eyes   Ears:  Normal TM's and external ear canals, both ears   Nose:  Nares normal, septum midline, mucosa normal, no drainage  or sinus tenderness   Throat:  Lips, mucosa, and tongue normal; teeth in fair repair  Neck:  Supple, symmetrical, trachea midline, no adenopathy;  thyroid: No enlargement/tenderness/nodules; Right carotid  bruit  Appreciated ; no JVD's  Back:   no CVA tenderness   Lungs:  Clear to auscultation bilaterally, respirations unlabored   Chest wall:  No tenderness or deformity   Heart:  Tachycardiac, normal rhythm,no murmur, rub    Abdomen:  Soft, non-tender, bowel sounds active all four quadrants,  no masses, no organomegaly   Extremities:  Full ROM proximally and distally of upper and lower extremities bilaterally.  Pulses:  2+ and symmetric all extremities   Skin:  Skin color, texture, turgor normal, no rashes or lesions   Neuro:   CN II-XII intact; sensory exam without deficits; no Babinski bilaterally; DTR's 2+/4  bilaterally. No focal deficits.  Lymph nodes:  Cervical, supraclavicular, and axillary nodes normal      Lab results: Basic Metabolic Panel:  [facility] 11/09/10 2120  NA 138  K 3.4*  CL 97  CO2 28  GLUCOSE 173*  BUN 10  CREATININE 0.49*  CALCIUM 9.8  MG --  PHOS --   Liver Function Tests:  [facility] 11/09/10 2120  AST 13  ALT 12  ALKPHOS 69  BILITOT 0.5  PROT 8.6*  ALBUMIN 4.5    Basename 11/09/10 2120  LIPASE 21  AMYLASE --   No results found for this basename: AMMONIA:2 in the last 72 hours CBC:  Basename 11/09/10 2120  WBC 6.6  NEUTROABS 5.1  HGB 13.1  HCT 41.4  MCV 83.0  PLT 380     Imaging results:  Dg Abd Acute W/chest  11/09/2010  *RADIOLOGY REPORT*  Clinical Data: Abdominal pain, nausea and vomiting.  ACUTE ABDOMEN SERIES (ABDOMEN 2 VIEW & CHEST 1 VIEW)  Comparison: Chest radiograph performed 07/08/2010, and PET/CT images performed 12/22/2007  Findings: There is a persistent spiculated mass within the right upper lobe, measuring approximately 3.9 cm in size.  This appears relatively stable from the prior radiograph, and may be minimally increased in size from the prior PET/CT performed in 2009.  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  The lungs are otherwise grossly clear.  No pleural effusion or pneumothorax is seen.  The cardiomediastinal silhouette is borderline enlarged.  There is a relative paucity of bowel gas within the abdomen and pelvis, though air and minimal stool are seen within the colon; no abnormal dilatation of small bowel loops is seen to suggest small bowel obstruction.  No free intra-abdominal air is identified on the provided decubitus images.  No acute osseous abnormalities are seen; mild sclerotic change is noted at the sacroiliac joints.  The visualized lung bases are essentially clear.  IMPRESSION:  1.   Relatively stable spiculated mass within the right upper lung lobe, measuring approximately 3.9 cm; this may be minimally increased in size from the prior PET/CT performed at 2009.  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 2.  Relative paucity of bowel gas within the abdomen and pelvis; no evidence for bowel obstruction.  No free intra-abdominal air seen.  Original Report Authenticated By: Tonia Ghent, M.D.    Assessment & Plan: Ms. Kessen is a 77 y/o AA woman admitted with emesis and markedly elevated blood pressure. Therefore, the patient was admitted for: 1. Hypertensive urgency. It is unclear whether the patient is compliant with her anti-hypertensive regimen.  -Telemetry -CE x3 6 hours apart; 1st set now -EKG now and in am - Coreg 3.125 mg PO bid -Increase lisinopril to 40 mg Po daily -Hydralazine 10 mg IV q 4 hrs PRN for SBP above 200 -Morphine  1-2 mg IV q 3 hrs PRN for pain with hold parameters. -start ASA 81 mg PO daily -Goal SBP: 170 -Monitor mental status, especially with a previous history of CVA and TIA. -Right carotid bruit --no carotid duplex at this time since her last carotid US in may of 2012 did not show any evidence of stenosis. -CLD  2. Severe systolic CHF with EF of 15-20% per last 2 D ECHO in May of 2012. Patient does not seem to be decompensated.  -continue with Coreg and Lisinopril - patient was restarted on ASA. Although, she has not A. Fib;  her CHADs score is 6. She may benefit from being changed to Plavix due to her multiple comorbidities. Will discuss with an attending, Dr. Josem Kaufmann. -FLP  3. DM, insulin-dependent. Last HgbA1c of 8.3 in May of 2012. -repeat HgbA1C -SSI, sensitive protocol -reduce Lantus dose from 40 down to 20 Units SQ qhs due to poor oral intake. -CBG's per protocol   4.  Right upper lung lobe spiculated mass which is  not new but may be minimally increased in size since prior PET/CT done in 2009. Consider to repeat PET scan as this may be concerning for an indolent broncho alveloar cell carcinoma. Will discuss with the team.  DVT PPX -  Heparin 5000 Units SQ q 8 hrs    Deatra Robinson M.D. (PGY3):  ____________________________________    Date/ Time:     ____________________________________     I have seen and  examined the patient. I reviewed the resident/fellow note and agree with the findings and plan of care as documented. My additions and revisions are included.   Signature:  ____________________________________________     Internal Medicine Teaching Service Attending    Date:    ____________________________________________

## 2010-11-11 ENCOUNTER — Inpatient Hospital Stay (HOSPITAL_COMMUNITY): Payer: PRIVATE HEALTH INSURANCE

## 2010-11-11 ENCOUNTER — Encounter (HOSPITAL_COMMUNITY): Payer: Self-pay | Admitting: Radiology

## 2010-11-11 LAB — CBC

## 2010-11-11 LAB — BASIC METABOLIC PANEL
BUN: 16 mg/dL (ref 6–23)
Calcium: 9.8 mg/dL (ref 8.4–10.5)
Creatinine, Ser: 0.52 mg/dL (ref 0.50–1.10)
GFR calc Af Amer: >60 mL/min (ref >60)

## 2010-11-11 LAB — GLUCOSE, CAPILLARY
Glucose-Capillary: 184 mg/dL — ABNORMAL HIGH (ref 70–99)
Glucose-Capillary: 172 mg/dL — ABNORMAL HIGH (ref 70–99)
Glucose-Capillary: 145 mg/dL — ABNORMAL HIGH (ref 70–99)
Glucose-Capillary: 146 mg/dL — ABNORMAL HIGH (ref 70–99)

## 2010-11-12 DIAGNOSIS — I10 Essential (primary) hypertension: Secondary | ICD-10-CM

## 2010-11-12 DIAGNOSIS — I059 Rheumatic mitral valve disease, unspecified: Secondary | ICD-10-CM

## 2010-11-12 DIAGNOSIS — E876 Hypokalemia: Secondary | ICD-10-CM | POA: Insufficient documentation

## 2010-11-12 DIAGNOSIS — J449 Chronic obstructive pulmonary disease, unspecified: Secondary | ICD-10-CM

## 2010-11-12 DIAGNOSIS — R1110 Vomiting, unspecified: Secondary | ICD-10-CM

## 2010-11-12 DIAGNOSIS — R911 Solitary pulmonary nodule: Secondary | ICD-10-CM

## 2010-11-12 LAB — URINALYSIS, MICROSCOPIC ONLY
Bilirubin Urine: NEGATIVE
Glucose, UA: NEGATIVE mg/dL
Nitrite: NEGATIVE
Specific Gravity, Urine: 1.023 (ref 1.005–1.030)
pH: 5 (ref 5.0–8.0)

## 2010-11-12 LAB — BASIC METABOLIC PANEL
CO2: 27 mEq/L (ref 19–32)
CO2: 32 mEq/L (ref 19–32)
Calcium: 9.6 mg/dL (ref 8.4–10.5)
Chloride: 94 mEq/L — ABNORMAL LOW (ref 96–112)
Creatinine, Ser: 0.5 mg/dL (ref 0.50–1.10)
GFR calc Af Amer: >60 mL/min (ref >60)
GFR calc non Af Amer: >60 mL/min (ref >60)
Glucose, Bld: 157 mg/dL — ABNORMAL HIGH (ref 70–99)
Glucose, Bld: 139 mg/dL — ABNORMAL HIGH (ref 70–99)
Potassium: 3 mEq/L — ABNORMAL LOW (ref 3.5–5.1)
Potassium: 4 mEq/L (ref 3.5–5.1)
Sodium: 138 mEq/L (ref 135–145)

## 2010-11-12 LAB — GLUCOSE, CAPILLARY
Glucose-Capillary: 154 mg/dL — ABNORMAL HIGH (ref 70–99)
Glucose-Capillary: 137 mg/dL — ABNORMAL HIGH (ref 70–99)

## 2010-11-13 ENCOUNTER — Inpatient Hospital Stay (HOSPITAL_COMMUNITY): Payer: PRIVATE HEALTH INSURANCE

## 2010-11-13 DIAGNOSIS — R1110 Vomiting, unspecified: Secondary | ICD-10-CM

## 2010-11-13 DIAGNOSIS — I10 Essential (primary) hypertension: Secondary | ICD-10-CM

## 2010-11-13 DIAGNOSIS — I428 Other cardiomyopathies: Secondary | ICD-10-CM

## 2010-11-13 LAB — BASIC METABOLIC PANEL
BUN: 28 mg/dL — ABNORMAL HIGH (ref 6–23)
Calcium: 9.5 mg/dL (ref 8.4–10.5)
GFR calc non Af Amer: >60 mL/min (ref >60)
Glucose, Bld: 132 mg/dL — ABNORMAL HIGH (ref 70–99)
Potassium: 3.2 mEq/L — ABNORMAL LOW (ref 3.5–5.1)

## 2010-11-13 LAB — GLUCOSE, CAPILLARY
Glucose-Capillary: 112 mg/dL — ABNORMAL HIGH (ref 70–99)
Glucose-Capillary: 136 mg/dL — ABNORMAL HIGH (ref 70–99)

## 2010-11-13 LAB — MAGNESIUM: Magnesium: 2.1 mg/dL (ref 1.5–2.5)

## 2010-11-13 MED ORDER — GADOBENATE DIMEGLUMINE 529 MG/ML IV SOLN
12.0000 mL | Freq: Once | INTRAVENOUS | Status: AC | PRN
  Administered 2010-11-13: 12 mL via INTRAVENOUS

## 2010-11-13 NOTE — Consult Note (Addendum)
[name], TANG NO.:  [id number]  MEDICAL RECORD NO.:  [id number]  LOCATION:                                 FACILITY:  PHYSICIAN:  Felipa Evener, MD  DATE OF BIRTH:  1933/10/17  DATE OF CONSULTATION: DATE OF DISCHARGE:                                CONSULTATION   CONSULTING PHYSICIAN:  Doneen Poisson, MD from Teaching Service.  REASON FOR CONSULTATION:  Pulmonary nodule/mass.  ALLERGIES:  No known drug allergies.  HISTORY OF PRESENT ILLNESS:  [name] is a 77 year old white female with a past medical history of diabetes, hyperlipidemia, GERD, hypertension, CVA in 2006, diverticulitis, arthritis, and dysphagia.  She presented to [facility] for evaluation of nausea and vomiting and was also noted to have markedly elevated blood pressure.  She was admitted by [facility] Internal Medicine Teaching Service.  Chest x-ray was evaluated.  She does have a known medical history of right upper lobe lung mass that was approximately 3.9 cm in size in 2009.  A repeat CT of the chest was completed on November 12, 2010, which demonstrated increasing size and density of spiculated right upper lobe mass.  Previously, [name] has been followed by Dr. Karle Plumber in the past and may have been lost to followup.  Pulmonary and Critical Care Medicine were consulted for potential bronchoscopy and biopsy.  After evaluation of CT, it was felt it is not likely approachable with blind bronchoscopy, and could consider electromagnetic navigation bronchoscopy.  Dr. Edwyna Shell was contacted for further evaluation since he is familiar with patient and has followed the mass in the past.  PAST MEDICAL HISTORY: 1. Right upper lobe spiculated mass which was 2.9 cm long in February     2010 and in October 2009, mass was noted to be 4.7 x 1.5 cm in the     right upper lobe. 2. Diabetes mellitus. 3. Hyperlipidemia. 4. Hypertension. 5. GERD. 6. CVA. 7. Diverticulitis. 8.  Arthritis. 9. Dysphagia.  SOCIAL HISTORY:  [name] is widowed.  She is retired, has a tenth grade education, currently lives with her daughter.  She is a former smoker.  PAST SURGICAL HISTORY:  Abdominal hysterectomy.  FAMILY HISTORY:  She has a brother with hyperlipidemia, hypertension, and diabetes.  REVIEW OF SYSTEMS:  GENERAL:  Denies fevers or chills, sweats.  HEENT: Denies headache, nasal discharge, nose bleeds, vision, or hearing changes. SKIN:  Denies rashes or lesions.  CARDIOVASCULAR/PULMONARY:  Denies shortness of breath, dyspnea on exertion.  Denies chest pain, cough, or wheezing.  GU:  Denies frequency, urgency, or dysuria.  GI:  Does indicate nausea and vomiting prior to admission.  ENDOCRINE:  Denies polyuria, polydipsia, heat or cold intolerance.  All other systems reviewed and are negative.  CURRENT MEDICATIONS: 1. Biotene b.i.d. 2. Aspirin 81 mg p.o. daily. 3. Coreg 6.25 mg p.o. b.i.d. 4. Citalopram 20 mg p.o. at bedtime. 5. Heparin 5000 units subcu q.8. 6. NovoLog sliding scale insulin 1-9 units t.i.d. with meals. 7. NovoLog meal coverage 3 units with meals. 8. Protonix 20 mg b.i.d. 9. Zocor 20 mg p.o. at bedtime. 10.Saline 3 mL IV q.12 hours for flush. 11.Tylenol 650 mg p.o.  q.6 p.r.n. 12.Hydralazine 10 mg IV q.6 hours p.r.n.  PHYSICAL EXAMINATION:  VITAL SIGNS:  Temperature 98.8, pulse 112, respirations 18, blood pressure 187/96, oxygen saturation 97% on room air.  LABORATORY DATA:  On November 12, 2010, BMP demonstrates sodium 138, potassium 3.0, chloride 94, CO2 of 32, glucose 139, BUN 20, creatinine 0.65, calcium 9.8.  On November 09, 2010, CBC, demonstrates WBC 6.6, hemoglobin 13.1, hematocrit 41.4, platelet count 380.  RADIOLOGY:  CT of the chest on November 12, 2010, demonstrates increasing size and density of spiculated right upper lobe nodule and multiple faint peripheral pulmonary nodules in both lungs.  There is also noted a stable  right hepatic lobe, low-density lesion.  Also 3 faint low density lesions in the vertebral bodies.  ASSESSMENT AND PLAN:  Increasing size, right upper lobe spiculated pulmonary nodule.  [name] is a 77 year old female with a history of lung mass that is unfortunately increasing in size.  She is followed by Dr. Karle Plumber and likely will need biopsy.  It is felt at this time it is likely not approachable with a blind bronchoscopy and potentially may need electromagnetic navigation bronchoscopy.  Dr. Edwyna Shell has been called and we will defer to him for biopsy as he is familiar with [name], and has followed her pulmonary mass in the past.     Canary Brim, NP   ______________________________ Felipa Evener, MD    BO/MEDQ  D:  11/12/2010  T:  11/12/2010  Job:  [id number]  Electronically Signed by Koren Bound MD on 12/02/2010 06:24:34 PM Electronically Signed by Canary Brim  on 12/09/2010 12:09:54 PM

## 2010-11-14 LAB — GLUCOSE, CAPILLARY
Glucose-Capillary: 106 mg/dL — ABNORMAL HIGH (ref 70–99)
Glucose-Capillary: 153 mg/dL — ABNORMAL HIGH (ref 70–99)

## 2010-11-14 LAB — BASIC METABOLIC PANEL
CO2: 27 mEq/L (ref 19–32)
Calcium: 9.3 mg/dL (ref 8.4–10.5)
GFR calc Af Amer: >60 mL/min (ref >60)
GFR calc non Af Amer: >60 mL/min (ref >60)
Sodium: 138 mEq/L (ref 135–145)

## 2010-11-14 LAB — CBC
HCT: 40.2 % (ref 36.0–46.0)
RDW: 14.6 % (ref 11.5–15.5)
WBC: 7.6 10*3/uL (ref 4.0–10.5)

## 2010-11-14 NOTE — Consult Note (Signed)
[name], [name]              ACCOUNT NO.:  [id number]  MEDICAL RECORD NO.:  [id number]  LOCATION:                                 FACILITY:  PHYSICIAN:  Verne Carrow, MDDATE OF BIRTH:  December 13, 1933  DATE OF CONSULTATION:  11/13/2010 DATE OF DISCHARGE:                                CONSULTATION   REASON FOR CONSULTATION:  Cardiomyopathy.  HISTORY OF PRESENT ILLNESS:  [name] is a 77 year old African American female with a past medical history significant for hypertension, hyperlipidemia, diabetes mellitus, GERD, prior CVA, and a right upper lobe mass that is suspicious for malignancy, who is admitted to [facility] on the Internal Medicine Teaching Service after episodes of vomiting.  The patient was admitted to the hospital on November 10, 2010.  She tells me that her vomiting has resolved.  On admission, her blood pressure was 200 systolic.  The patient has a known history of cardiomyopathy with ejection fraction of 15% by echocardiogram in May 2012.  That echocardiogram was performed during a hospitalization. Cardiology was not involved during that hospitalization.  I do not see any workup has been performed of this patient's cardiomyopathy at this time.  There has been no prior ischemic workup.  The patient is a difficult historian and tells me today that she feels well.  She denies any shortness of breath, chest pain, or palpitations.  She has had several episodes of nonsustained ventricular tachycardia occurring over 48 hours ago.  PAST MEDICAL HISTORY: 1. Diabetes mellitus. 2. Hyperlipidemia. 3. Hypertension. 4. Cardiomyopathy of unknown etiology. 5. GERD. 6. Right embolic CVA in 2006. 7. Arthritis. 8. Dysphagia. 9. Right upper lobe mass suspicious for malignancy.  PAST SURGICAL HISTORY: 1. Broken right ankle. 2. Left shoulder surgery. 3. Hysterectomy.  ALLERGIES:  No known drug allergies.  CURRENT MEDICATIONS: 1. Aspirin 81 mg once  daily. 2. Coreg 6.25 mg twice daily. 3. Celexa. 4. Colace. 5. Sliding scale insulin. 6. Lisinopril 5 mg once daily. 7. Simvastatin 40 mg once daily.  SOCIAL HISTORY:  The patient lives in [place] with her daughter.  She is a widow.  She apparently smoked cigarettes for 30 years, but quit smoking 10 years ago.  She denies use of alcohol or drugs.  FAMILY HISTORY:  The patient's mother died at a young age of an unknown cause.  Her father died at age 33 from unknown cause.  REVIEW OF SYSTEMS:  As stated in history of present illness, otherwise negative.  PHYSICAL EXAMINATION:  VITAL SIGNS:  Temperature 98.2, pulse 70 and regular, respirations 12 and unlabored, blood pressure 170/90, she is sating 100% on room air. GENERAL:  She is a pleasant, elderly African American female in no acute distress.  She is alert and oriented x3.  She appears somnolent. HEENT:  Normal. SKIN:  Warm and dry. MUSCULOSKELETAL:  Moves all extremities equally. NEUROLOGICAL:  Nonfocal. PSYCHIATRIC:  Mood is appropriate.  Affect is flat. LUNGS:  Clear to auscultation bilaterally without wheezes, rhonchi, or crackles noted. CARDIOVASCULAR:  Regular rate and rhythm without murmurs, gallops, or rubs noted. ABDOMEN:  Soft, nontender.  Bowel sounds are present. EXTREMITIES:  No evidence of edema.  Pulses are 2+ in  all extremities.  DIAGNOSTIC STUDIES: 1. Echocardiogram performed on November 12, 2010, shows mild left     ventricular hypertrophy with severe left ventricular systolic     dysfunction with ejection fraction of 15%-20%.  There is dyskinesis     of the basal and mid anteroseptal myocardium.  There is mild mitral     regurgitation noted. 2. A 12-lead EKG shows sinus tachycardia with a rate of 108 beats per     minute.  There are frequent premature ventricular contractions     noted.  Left ventricular hypertrophy is present. 3. Laboratory values show negative cardiac enzymes x3.  ASSESSMENT AND  PLAN: 1. This patient has cardiomyopathy of unknown etiology.  She was     admitted 4 months ago and had an echocardiogram that showed severe     left ventricular systolic dysfunction.  No cardiac workup was     pursued at that time.  I do not see whether Cardiology was involved     at that time.  Repeat echocardiogram now shows severe left     ventricular systolic dysfunction which is unchanged.  There is     global hypokinesis of the left ventricle.  Her volume status     currently is okay.  She is on a good medication regimen including     an angiotensin-converting enzyme inhibitor, beta-blocker, aspirin,     and statin.  I feel that this is most likely a nonischemic     cardiomyopathy.  However, she does have multiple risk factors for     coronary artery disease including hypertension, hyperlipidemia,     diabetes mellitus, and former tobacco abuse.  She currently has no     signs or symptoms of myocardial ischemia or unstable angina.  With     her history of longstanding hypertension that has been uncontrolled     and documented to be uncontrolled here in this hospitalization, it     is most likely that her cardiomyopathy is related to a hypertensive     cause.  She will, however, need an ischemic workup.  I would     suggest a Lexiscan Myoview either as an inpatient or outpatient to     exclude ischemia.  She will be continued on good heart failure     medications for the next 6 months and echocardiogram will be     repeated.  If there is no improvement in her ejection fraction,     then we will need to consider placement of an ICD. 2. The patient also has a growing lung mass that is spiculated in     appearance.  Pulmonary has been involved.  It has been indicated in     the chart that a biopsy will be needed.  If this patient has lung     cancer, it will need to be staged and this would also change our     plan in regards to placement of a defibrillator and further cardiac      workup.  We will continue to follow along in regards to the lung     mass. 3. Several episodes of nonsustained ventricular tachycardia.  I would     continue to titrate her beta-blocker as tolerated.  We will     continue to follow with you.     Verne Carrow, MD     CM/MEDQ  D:  11/13/2010  T:  11/14/2010  Job:  [id number]  Electronically Signed by Cristal Deer  Lynise Porr MD on 11/14/2010 03:07:36 PM

## 2010-11-15 ENCOUNTER — Inpatient Hospital Stay (HOSPITAL_COMMUNITY): Payer: PRIVATE HEALTH INSURANCE

## 2010-11-15 DIAGNOSIS — I509 Heart failure, unspecified: Secondary | ICD-10-CM

## 2010-11-15 LAB — GLUCOSE, CAPILLARY
Glucose-Capillary: 124 mg/dL — ABNORMAL HIGH (ref 70–99)
Glucose-Capillary: 103 mg/dL — ABNORMAL HIGH (ref 70–99)

## 2010-11-15 LAB — BASIC METABOLIC PANEL
CO2: 25 mEq/L (ref 19–32)
Calcium: 9.2 mg/dL (ref 8.4–10.5)
Chloride: 102 mEq/L (ref 96–112)
Glucose, Bld: 150 mg/dL — ABNORMAL HIGH (ref 70–99)
Potassium: 3.5 mEq/L (ref 3.5–5.1)
Sodium: 138 mEq/L (ref 135–145)

## 2010-11-16 ENCOUNTER — Inpatient Hospital Stay (HOSPITAL_COMMUNITY): Payer: PRIVATE HEALTH INSURANCE

## 2010-11-16 LAB — GLUCOSE, CAPILLARY
Glucose-Capillary: 146 mg/dL — ABNORMAL HIGH (ref 70–99)
Glucose-Capillary: 158 mg/dL — ABNORMAL HIGH (ref 70–99)

## 2010-11-16 LAB — BASIC METABOLIC PANEL
BUN: 17 mg/dL (ref 6–23)
CO2: 25 mEq/L (ref 19–32)
Calcium: 8.7 mg/dL (ref 8.4–10.5)
Chloride: 105 mEq/L (ref 96–112)
Creatinine, Ser: 0.63 mg/dL (ref 0.50–1.10)
GFR calc Af Amer: >60 mL/min (ref >60)

## 2010-11-16 LAB — CBC
HCT: 35 % — ABNORMAL LOW (ref 36.0–46.0)
MCH: 25.8 pg — ABNORMAL LOW (ref 26.0–34.0)
MCV: 82.7 fL (ref 78.0–100.0)
Platelets: 283 10*3/uL (ref 150–400)
RDW: 14.7 % (ref 11.5–15.5)
WBC: 6 10*3/uL (ref 4.0–10.5)

## 2010-11-16 MED ORDER — TECHNETIUM TC 99M TETROFOSMIN IV KIT
10.0000 | PACK | Freq: Once | INTRAVENOUS | Status: AC | PRN
  Administered 2010-11-15: 10 via INTRAVENOUS

## 2010-11-17 DIAGNOSIS — D381 Neoplasm of uncertain behavior of trachea, bronchus and lung: Secondary | ICD-10-CM

## 2010-11-17 DIAGNOSIS — I5021 Acute systolic (congestive) heart failure: Secondary | ICD-10-CM

## 2010-11-17 DIAGNOSIS — I10 Essential (primary) hypertension: Secondary | ICD-10-CM

## 2010-11-17 DIAGNOSIS — R1110 Vomiting, unspecified: Secondary | ICD-10-CM

## 2010-11-17 LAB — BASIC METABOLIC PANEL
Calcium: 8.5 mg/dL (ref 8.4–10.5)
Creatinine, Ser: 0.57 mg/dL (ref 0.50–1.10)
GFR calc Af Amer: >90 mL/min (ref >90)
Sodium: 138 mEq/L (ref 135–145)

## 2010-11-17 LAB — GLUCOSE, CAPILLARY
Glucose-Capillary: 125 mg/dL — ABNORMAL HIGH (ref 70–99)
Glucose-Capillary: 173 mg/dL — ABNORMAL HIGH (ref 70–99)
Glucose-Capillary: 240 — ABNORMAL HIGH
Glucose-Capillary: 317 — ABNORMAL HIGH
Glucose-Capillary: 274 — ABNORMAL HIGH
Glucose-Capillary: 135 mg/dL — ABNORMAL HIGH (ref 70–99)
Glucose-Capillary: 112 mg/dL — ABNORMAL HIGH (ref 70–99)

## 2010-11-17 LAB — MAGNESIUM: Magnesium: 2.1 mg/dL (ref 1.5–2.5)

## 2010-11-18 ENCOUNTER — Inpatient Hospital Stay (HOSPITAL_COMMUNITY): Payer: PRIVATE HEALTH INSURANCE

## 2010-11-18 ENCOUNTER — Ambulatory Visit (HOSPITAL_COMMUNITY)
Admit: 2010-11-18 | Discharge: 2010-11-18 | Disposition: A | Discharge status: Home / Self Care | Payer: PRIVATE HEALTH INSURANCE | Attending: Thoracic Surgery | Admitting: Thoracic Surgery

## 2010-11-18 DIAGNOSIS — R1310 Dysphagia, unspecified: Secondary | ICD-10-CM

## 2010-11-18 DIAGNOSIS — I5022 Chronic systolic (congestive) heart failure: Secondary | ICD-10-CM

## 2010-11-18 DIAGNOSIS — K219 Gastro-esophageal reflux disease without esophagitis: Secondary | ICD-10-CM

## 2010-11-18 DIAGNOSIS — K222 Esophageal obstruction: Secondary | ICD-10-CM

## 2010-11-18 LAB — COMPREHENSIVE METABOLIC PANEL
AST: 17 U/L (ref 0–37)
Albumin: 3.4 g/dL — ABNORMAL LOW (ref 3.5–5.2)
BUN: 11 mg/dL (ref 6–23)
CO2: 28 mEq/L (ref 19–32)
Calcium: 9.1 mg/dL (ref 8.4–10.5)
Chloride: 102 mEq/L (ref 96–112)
Creatinine, Ser: 0.68 mg/dL (ref 0.4–1.2)
GFR calc Af Amer: >60 mL/min (ref >60)
GFR calc non Af Amer: >60 mL/min (ref >60)
Total Bilirubin: 0.4 mg/dL (ref 0.3–1.2)

## 2010-11-18 LAB — CBC
HCT: 35.7 % — ABNORMAL LOW (ref 36.0–46.0)
HCT: 37 % (ref 36.0–46.0)
Hemoglobin: 11.3 g/dL — ABNORMAL LOW (ref 12.0–15.0)
MCH: 25.9 pg — ABNORMAL LOW (ref 26.0–34.0)
MCHC: 31.7 g/dL (ref 30.0–36.0)
MCHC: 32.4 g/dL (ref 30.0–36.0)
MCV: 81.7 fL (ref 78.0–100.0)
MCV: 80.6 fL (ref 78.0–100.0)
Platelets: 307 10*3/uL (ref 150–400)
RDW: 14.5 % (ref 11.5–15.5)

## 2010-11-18 LAB — BASIC METABOLIC PANEL
BUN: 8 mg/dL (ref 6–23)
Creatinine, Ser: 0.64 mg/dL (ref 0.50–1.10)
GFR calc Af Amer: >90 mL/min (ref >90)
GFR calc non Af Amer: 85 mL/min — ABNORMAL LOW (ref >90)
Glucose, Bld: 98 mg/dL (ref 70–99)

## 2010-11-18 LAB — GLUCOSE, CAPILLARY
Glucose-Capillary: 168 mg/dL — ABNORMAL HIGH (ref 70–99)
Glucose-Capillary: 220 mg/dL — ABNORMAL HIGH (ref 70–99)
Glucose-Capillary: 109 mg/dL — ABNORMAL HIGH (ref 70–99)
Glucose-Capillary: 176 mg/dL — ABNORMAL HIGH (ref 70–99)
Glucose-Capillary: 223 — ABNORMAL HIGH

## 2010-11-18 LAB — AFB CULTURE WITH SMEAR (NOT AT ARMC)

## 2010-11-18 LAB — APTT: aPTT: 25 seconds (ref 24–37)

## 2010-11-18 LAB — FUNGUS CULTURE W SMEAR

## 2010-11-18 LAB — PROTIME-INR: INR: 0.9 (ref 0.00–1.49)

## 2010-11-18 LAB — CULTURE, RESPIRATORY W GRAM STAIN

## 2010-11-18 MED ORDER — FLUDEOXYGLUCOSE F - 18 (FDG) INJECTION
17.7000 | Freq: Once | INTRAVENOUS | Status: AC | PRN
  Administered 2010-11-18: 17.7 via INTRAVENOUS

## 2010-11-19 ENCOUNTER — Inpatient Hospital Stay (HOSPITAL_COMMUNITY): Payer: PRIVATE HEALTH INSURANCE

## 2010-11-19 ENCOUNTER — Other Ambulatory Visit (HOSPITAL_COMMUNITY): Payer: PRIVATE HEALTH INSURANCE

## 2010-11-19 ENCOUNTER — Encounter: Payer: Self-pay | Admitting: *Deleted

## 2010-11-19 DIAGNOSIS — R1310 Dysphagia, unspecified: Secondary | ICD-10-CM

## 2010-11-19 DIAGNOSIS — D381 Neoplasm of uncertain behavior of trachea, bronchus and lung: Secondary | ICD-10-CM

## 2010-11-19 DIAGNOSIS — K222 Esophageal obstruction: Secondary | ICD-10-CM

## 2010-11-19 DIAGNOSIS — K219 Gastro-esophageal reflux disease without esophagitis: Secondary | ICD-10-CM

## 2010-11-19 LAB — BASIC METABOLIC PANEL
BUN: 8 mg/dL (ref 6–23)
CO2: 29 mEq/L (ref 19–32)
Chloride: 99 mEq/L (ref 96–112)
Creatinine, Ser: 0.73 mg/dL (ref 0.50–1.10)
GFR calc Af Amer: >90 mL/min (ref >90)
Glucose, Bld: 172 mg/dL — ABNORMAL HIGH (ref 70–99)
Potassium: 4.1 mEq/L (ref 3.5–5.1)

## 2010-11-19 LAB — PROTIME-INR: Prothrombin Time: 13.4 seconds (ref 11.6–15.2)

## 2010-11-19 LAB — GLUCOSE, CAPILLARY

## 2010-11-20 ENCOUNTER — Other Ambulatory Visit: Payer: Self-pay | Admitting: Interventional Radiology

## 2010-11-20 ENCOUNTER — Inpatient Hospital Stay (HOSPITAL_COMMUNITY): Payer: PRIVATE HEALTH INSURANCE

## 2010-11-20 DIAGNOSIS — I10 Essential (primary) hypertension: Secondary | ICD-10-CM

## 2010-11-20 DIAGNOSIS — R1110 Vomiting, unspecified: Secondary | ICD-10-CM

## 2010-11-20 LAB — GLUCOSE, CAPILLARY
Glucose-Capillary: 118 mg/dL — ABNORMAL HIGH (ref 70–99)
Glucose-Capillary: 115 mg/dL — ABNORMAL HIGH (ref 70–99)

## 2010-11-20 LAB — BASIC METABOLIC PANEL
CO2: 29 mEq/L (ref 19–32)
Chloride: 100 mEq/L (ref 96–112)
Glucose, Bld: 122 mg/dL — ABNORMAL HIGH (ref 70–99)
Potassium: 3.3 mEq/L — ABNORMAL LOW (ref 3.5–5.1)
Sodium: 138 mEq/L (ref 135–145)

## 2010-11-21 ENCOUNTER — Inpatient Hospital Stay (HOSPITAL_COMMUNITY): Payer: PRIVATE HEALTH INSURANCE

## 2010-11-21 ENCOUNTER — Encounter: Payer: PRIVATE HEALTH INSURANCE | Admitting: Internal Medicine

## 2010-11-21 DIAGNOSIS — I509 Heart failure, unspecified: Secondary | ICD-10-CM

## 2010-11-21 DIAGNOSIS — D381 Neoplasm of uncertain behavior of trachea, bronchus and lung: Secondary | ICD-10-CM

## 2010-11-21 LAB — BASIC METABOLIC PANEL
BUN: 14 mg/dL (ref 6–23)
Creatinine, Ser: 0.83 mg/dL (ref 0.50–1.10)
GFR calc Af Amer: 78 mL/min — ABNORMAL LOW (ref >90)
GFR calc non Af Amer: 67 mL/min — ABNORMAL LOW (ref >90)
Glucose, Bld: 96 mg/dL (ref 70–99)
Potassium: 3.5 mEq/L (ref 3.5–5.1)

## 2010-11-21 LAB — GLUCOSE, CAPILLARY
Glucose-Capillary: 102 mg/dL — ABNORMAL HIGH (ref 70–99)
Glucose-Capillary: 107 mg/dL — ABNORMAL HIGH (ref 70–99)

## 2010-11-22 LAB — BASIC METABOLIC PANEL
Chloride: 104 mEq/L (ref 96–112)
GFR calc Af Amer: >90 mL/min (ref >90)
GFR calc non Af Amer: 81 mL/min — ABNORMAL LOW (ref >90)
Potassium: 4.1 mEq/L (ref 3.5–5.1)
Sodium: 138 mEq/L (ref 135–145)

## 2010-11-22 LAB — CBC
HCT: 32.1 % — ABNORMAL LOW (ref 36.0–46.0)
MCHC: 31.5 g/dL (ref 30.0–36.0)
RDW: 14.6 % (ref 11.5–15.5)
WBC: 6.3 10*3/uL (ref 4.0–10.5)

## 2010-11-22 LAB — GLUCOSE, CAPILLARY
Glucose-Capillary: 102 mg/dL — ABNORMAL HIGH (ref 70–99)
Glucose-Capillary: 161 mg/dL — ABNORMAL HIGH (ref 70–99)

## 2010-11-22 LAB — PRO B NATRIURETIC PEPTIDE: Pro B Natriuretic peptide (BNP): 238.8 pg/mL (ref 0–450)

## 2010-11-23 LAB — GLUCOSE, CAPILLARY
Glucose-Capillary: 127 mg/dL — ABNORMAL HIGH (ref 70–99)
Glucose-Capillary: 234 mg/dL — ABNORMAL HIGH (ref 70–99)

## 2010-11-25 DIAGNOSIS — I5032 Chronic diastolic (congestive) heart failure: Secondary | ICD-10-CM | POA: Insufficient documentation

## 2010-11-26 ENCOUNTER — Encounter: Payer: Self-pay | Admitting: Thoracic Surgery

## 2010-11-26 ENCOUNTER — Ambulatory Visit (INDEPENDENT_AMBULATORY_CARE_PROVIDER_SITE_OTHER): Payer: PRIVATE HEALTH INSURANCE | Admitting: Thoracic Surgery

## 2010-11-26 ENCOUNTER — Other Ambulatory Visit (HOSPITAL_COMMUNITY): Payer: Self-pay | Admitting: Cardiology

## 2010-11-26 VITALS — BP 106/62 | HR 80 | Resp 20 | Ht 64.0 in | Wt 173.0 lb

## 2010-11-26 DIAGNOSIS — I509 Heart failure, unspecified: Secondary | ICD-10-CM

## 2010-11-26 DIAGNOSIS — R222 Localized swelling, mass and lump, trunk: Secondary | ICD-10-CM

## 2010-11-26 NOTE — Progress Notes (Signed)
HPI the patient returns for further discussion of her right upper lobe mass in 2009 we did a bronchoscopy which was negative PET scan was also negative at this time. Recent CT scan showed enlargement of the right upper lobe lesion. PET scan showed increased uptake in the right upper lobe lesion. No uptake in the mediastinal nodes or hilar nodes. Biopsy revealed atypical cells. I feel she has a slow-growing non-small cell lung cancer probably adenocarcinoma. I've recommended that she have a right upper lobectomy her pulmonary function test in the hospital are satisfactory for right upper lobectomy. I discussed the surgery in detail with her and her family including risk. I discussed the risk of the death hemorrhage infection prolonged air leak myocardial infarction, pulmonary embolus, and other risk. Feel removal of her right upper lobe is a moderately risky procedure but she will benefit with removal of the cancer. The patient understands this and accepts the decision to proceed with surgery. Plan to do this on October 29 at [facility].  Current Outpatient Prescriptions  Medication Sig Dispense Refill  . acetaminophen (TYLENOL) 325 MG tablet Take 650 mg by mouth every 6 (six) hours as needed.        Marland Kitchen aspirin 81 MG tablet Take 81 mg by mouth daily.        . carvedilol (COREG) 3.125 MG tablet Take 1 tablet (3.125 mg total) by mouth 2 (two) times daily with a meal.  60 tablet  5  . citalopram (CELEXA) 20 MG tablet TAKE 1 TABLET BY MOUTH AT BEDTIME  30 tablet  2  . insulin glargine (LANTUS SOLOSTAR) 100 UNIT/ML injection Inject 40 Units into the skin at bedtime.       . Insulin Pen Needle (PEN NEEDLES) 31G X 8 MM MISC Use to inject insulin 4 times daily.      Marland Kitchen lisinopril (PRINIVIL,ZESTRIL) 20 MG tablet Take 1 tablet (20 mg total) by mouth daily.  30 tablet  11  . ofloxacin (OCUFLOX) 0.3 % ophthalmic solution Place 1 drop into both eyes 4 (four) times daily.        Marland Kitchen oxyCODONE-acetaminophen (PERCOCET)  7.5-500 MG per tablet Take 1 tablet by mouth 3 (three) times daily as needed for pain.  90 tablet  0  . pravastatin (PRAVACHOL) 40 MG tablet Take 1 tablet (40 mg total) by mouth at bedtime.  30 tablet  5  . simvastatin (ZOCOR) 20 MG tablet Take 20 mg by mouth at bedtime.           Review of Systems: No change since discharge from hospital.   Physical Exam  Constitutional: She is oriented to person, place, and time. She appears well-developed and well-nourished.  HENT:  Head: Normocephalic.  Right Ear: External ear normal.  Left Ear: External ear normal.  Eyes: EOM are normal.  Neck: Normal range of motion. Neck supple.  Cardiovascular: Normal rate, regular rhythm, normal heart sounds and intact distal pulses.   Pulmonary/Chest: Effort normal and breath sounds normal. No respiratory distress.  Musculoskeletal: She exhibits no edema.  Neurological: She is alert and oriented to person, place, and time. She has normal reflexes.  Skin: Skin is warm.     Diagnostic Tests: See history of present illness   Impression: Right upper lobe mass probable adenocarcinoma in situ   Plan: Right VATS right upper lobectomy on October 29

## 2010-11-27 ENCOUNTER — Encounter (HOSPITAL_COMMUNITY): Payer: PRIVATE HEALTH INSURANCE | Admitting: Radiology

## 2010-11-30 ENCOUNTER — Encounter: Payer: Self-pay | Admitting: Internal Medicine

## 2010-12-02 ENCOUNTER — Encounter: Payer: Self-pay | Admitting: Internal Medicine

## 2010-12-02 ENCOUNTER — Ambulatory Visit (INDEPENDENT_AMBULATORY_CARE_PROVIDER_SITE_OTHER): Payer: PRIVATE HEALTH INSURANCE | Admitting: Internal Medicine

## 2010-12-02 DIAGNOSIS — D1803 Hemangioma of intra-abdominal structures: Secondary | ICD-10-CM

## 2010-12-02 DIAGNOSIS — F329 Major depressive disorder, single episode, unspecified: Secondary | ICD-10-CM

## 2010-12-02 DIAGNOSIS — D320 Benign neoplasm of cerebral meninges: Secondary | ICD-10-CM

## 2010-12-02 DIAGNOSIS — F32A Depression, unspecified: Secondary | ICD-10-CM

## 2010-12-02 DIAGNOSIS — R222 Localized swelling, mass and lump, trunk: Secondary | ICD-10-CM

## 2010-12-02 DIAGNOSIS — I509 Heart failure, unspecified: Secondary | ICD-10-CM

## 2010-12-02 DIAGNOSIS — E119 Type 2 diabetes mellitus without complications: Secondary | ICD-10-CM

## 2010-12-02 DIAGNOSIS — I10 Essential (primary) hypertension: Secondary | ICD-10-CM

## 2010-12-02 DIAGNOSIS — R112 Nausea with vomiting, unspecified: Secondary | ICD-10-CM

## 2010-12-02 DIAGNOSIS — E876 Hypokalemia: Secondary | ICD-10-CM

## 2010-12-02 DIAGNOSIS — R1110 Vomiting, unspecified: Secondary | ICD-10-CM | POA: Insufficient documentation

## 2010-12-02 DIAGNOSIS — I639 Cerebral infarction, unspecified: Secondary | ICD-10-CM

## 2010-12-02 DIAGNOSIS — E785 Hyperlipidemia, unspecified: Secondary | ICD-10-CM

## 2010-12-02 DIAGNOSIS — D329 Benign neoplasm of meninges, unspecified: Secondary | ICD-10-CM

## 2010-12-02 DIAGNOSIS — R918 Other nonspecific abnormal finding of lung field: Secondary | ICD-10-CM

## 2010-12-02 DIAGNOSIS — I6350 Cerebral infarction due to unspecified occlusion or stenosis of unspecified cerebral artery: Secondary | ICD-10-CM

## 2010-12-02 HISTORY — DX: Hemangioma of intra-abdominal structures: D18.03

## 2010-12-02 HISTORY — DX: Depression, unspecified: F32.A

## 2010-12-02 LAB — BASIC METABOLIC PANEL
CO2: 26 mEq/L (ref 19–32)
Chloride: 104 mEq/L (ref 96–112)
Creat: 0.83 mg/dL (ref 0.50–1.10)

## 2010-12-02 LAB — CBC
Hemoglobin: 11.2 g/dL — ABNORMAL LOW (ref 12.0–15.0)
MCV: 84.9 fL (ref 78.0–100.0)
Platelets: 336 10*3/uL (ref 150–400)
RBC: 4.3 MIL/uL (ref 3.87–5.11)
WBC: 5.6 10*3/uL (ref 4.0–10.5)

## 2010-12-02 MED ORDER — CARVEDILOL 12.5 MG PO TABS: 12.5000 mg | ORAL_TABLET | Freq: Two times a day (BID) | ORAL | Status: DC

## 2010-12-02 NOTE — Assessment & Plan Note (Signed)
Will follow up with Cardiology on 10/18.

## 2010-12-02 NOTE — Assessment & Plan Note (Signed)
Will continue current regimen. Will check FLP in 1-2 month for possible changes in management.

## 2010-12-02 NOTE — Assessment & Plan Note (Signed)
>>  ASSESSMENT AND PLAN FOR CHRONIC DIASTOLIC CONGESTIVE HEART FAILURE (HCC) WRITTEN ON 12/02/2010  7:12 PM BY NOVELLA ALIMENT, MD  Will follow up with Cardiology on 10/18.

## 2010-12-02 NOTE — Progress Notes (Signed)
Subjective:   Patient ID: [name] female   DOB: 1933/07/17 77 y.o.   MRN: [id number]  HPI: [name] is a 77 y.o.  Female with PMH signficant as outlined below who presented to the clinic for a hospital follow were she was evaluated for different problems. 1.Nausea/emesis, probable acute gastroenteritis. Barium swallow normal 2. Chronic systolic congestive heart failure with ejection fraction of 15-20%. Myoview negative for reversible causes.  3. Hypokalemia secondary to decreased p.o. intake and dehydration,  Controlled.  4. Type 2 diabetes mellitus.Started on Lantus 5. Pulmonary nodule, possible malignancy (bronchioloalveolar  carcinoma). Underwent CT chest, PET scan and Biopsy. Will have surgery on 10/29 by Dr Edwyna Shell 6. Malignant hypertension (greater than 180/120 with end-organ  damage.)   Since discharge from the hospital patient has been doing fine. Just occasional episode of dizziness.   Past Medical History  Diagnosis Date  . Diabetes mellitus 2007    HgA1C (02/20/2010) = 9.2, HgA1C (03/20/2009) = 12.1  . Hyperlipidemia   . Hypertension   . GERD (gastroesophageal reflux disease)   . CVA (cerebrovascular accident) 2006     right embolic stroke, no residual deficits  . Diverticulitis   . CVA (cerebral infarction) 7-yrs ago  . Arthritis   . Dysphagia   . CHF (congestive heart failure)    Current Outpatient Prescriptions  Medication Sig Dispense Refill  . acetaminophen (TYLENOL) 325 MG tablet Take 650 mg by mouth every 6 (six) hours as needed.        Marland Kitchen aspirin 81 MG tablet Take 81 mg by mouth daily.        . carvedilol (COREG) 3.125 MG tablet Take 12.5 mg by mouth 2 (two) times daily with a meal.        . citalopram (CELEXA) 20 MG tablet TAKE 1 TABLET BY MOUTH AT BEDTIME  30 tablet  2  . docusate sodium (COLACE) 100 MG capsule Take 100 mg by mouth daily.        . insulin glargine (LANTUS SOLOSTAR) 100 UNIT/ML injection Inject 40 Units into the skin at  bedtime.       . Insulin Pen Needle (PEN NEEDLES) 31G X 8 MM MISC Use to inject insulin 4 times daily.      Marland Kitchen lisinopril (PRINIVIL,ZESTRIL) 20 MG tablet Take 10 mg by mouth daily.        Marland Kitchen ofloxacin (OCUFLOX) 0.3 % ophthalmic solution Place 1 drop into both eyes 4 (four) times daily.        Marland Kitchen oxyCODONE-acetaminophen (PERCOCET) 7.5-500 MG per tablet Take 1 tablet by mouth 3 (three) times daily as needed for pain.  90 tablet  0  . pravastatin (PRAVACHOL) 40 MG tablet Take 1 tablet (40 mg total) by mouth at bedtime.  30 tablet  5  . simvastatin (ZOCOR) 20 MG tablet Take 20 mg by mouth at bedtime.         Review of Systems: Constitutional: Denies fever, chills, diaphoresis, appetite change and fatigue.  Respiratory: Denies SOB, DOE, cough, chest tightness,  and wheezing.   Cardiovascular: Denies chest pain, palpitations and leg swelling.  Gastrointestinal: Denies nausea, vomiting, abdominal pain, diarrhea, constipation, blood in stool and abdominal distention.  Genitourinary: Denies dysuria, urgency, frequency, hematuria, flank pain and difficulty urinating.  Musculoskeletal: Denies myalgias, back pain, joint swelling, arthralgias and gait problem.  Skin: Denies pallor, rash and wound.  Neurological: Denies dizziness, seizures, syncope, weakness, light-headedness, numbness and headaches.  Hematological: Denies adenopathy. Easy bruising, personal or family  bleeding history  Psychiatric/Behavioral: Denies suicidal ideation,   Objective:  Physical Exam: Filed Vitals:   12/02/10 1540  BP: 124/68  Pulse: 71  Temp: 97.6 F (36.4 C)  TempSrc: Oral  Height: 5\' 4"  (1.626 m)  Weight: 171 lb 11.2 oz (77.883 kg)   Constitutional: Vital signs reviewed.  Patient is a well-developed and well-nourished  in no acute distress and cooperative with exam. Alert and oriented x3.   Neck: Supple,  Cardiovascular: RRR, S1 normal, S2 normal, no MRG, pulses symmetric and intact bilaterally Pulmonary/Chest: CTAB,  no wheezes, rales, or rhonchi Abdominal: Soft. Non-tender, non-distended, bowel sounds are normal, no masses, organomegaly, or guarding present.  GU: no CVA tenderness Musculoskeletal: No joint deformities, erythema, or stiffness, ROM full and no nontender. Decreased motor strength bilaterally.  Neurological: A&O x3, no focal motor deficit, sensory intact to light touch bilaterally.  Skin: Warm, dry and intact. No rash, cyanosis, or clubbing.

## 2010-12-02 NOTE — Progress Notes (Deleted)
  Subjective:    Patient ID: [name], female    DOB: Oct 07, 1933, 77 y.o.   MRN: [id number]  HPI    Review of Systems     Objective:   Physical Exam        Assessment & Plan:

## 2010-12-02 NOTE — Assessment & Plan Note (Signed)
Patient was started on Lantus during hospital admission and Novolog was d/c . Patient is tolerating it well. Will continue current regimen and informed the patient and the daughter to check her CBG on a daily basis and bring the meter with her. If she notices CBG below 100 or CBG above 250 ( on a daily basis) she needs to call the clinic for possible changes in management.

## 2010-12-02 NOTE — Assessment & Plan Note (Signed)
Was started on Celexa during hospital admission. Consider to increase if no significant improvement.

## 2010-12-02 NOTE — Assessment & Plan Note (Signed)
Blood pressure well controlled on current regimen

## 2010-12-04 ENCOUNTER — Ambulatory Visit (INDEPENDENT_AMBULATORY_CARE_PROVIDER_SITE_OTHER): Payer: PRIVATE HEALTH INSURANCE | Admitting: Cardiology

## 2010-12-04 ENCOUNTER — Encounter: Payer: Self-pay | Admitting: Cardiology

## 2010-12-04 DIAGNOSIS — I509 Heart failure, unspecified: Secondary | ICD-10-CM

## 2010-12-04 DIAGNOSIS — I10 Essential (primary) hypertension: Secondary | ICD-10-CM

## 2010-12-04 DIAGNOSIS — R918 Other nonspecific abnormal finding of lung field: Secondary | ICD-10-CM

## 2010-12-04 DIAGNOSIS — R222 Localized swelling, mass and lump, trunk: Secondary | ICD-10-CM

## 2010-12-04 DIAGNOSIS — I5022 Chronic systolic (congestive) heart failure: Secondary | ICD-10-CM

## 2010-12-04 NOTE — Assessment & Plan Note (Signed)
The blood pressure is at target. No change in medications is indicated. We will continue with therapeutic lifestyle changes (TLC).  

## 2010-12-04 NOTE — Assessment & Plan Note (Signed)
At this point we have not evaluated the etiology of her cardiomyopathy.  I will send her back to the hospital to complete this study.  She seems to be euvolemic.  At this point, no change in therapy is indicated.  We have reviewed salt and fluid restrictions.  No further cardiovascular testing is indicated.  She will remain on the meds as listed.

## 2010-12-04 NOTE — Patient Instructions (Signed)
You will need to complete your stress testing at the hospital  The current medical regimen is effective;  continue present plan and medications.  Follow up with Tereso Newcomer in one month

## 2010-12-04 NOTE — Progress Notes (Signed)
HPI The patient presents for follow up of cardiomyopathy.  We saw her in the hospital recently for this. She was admitted for nausea and vomiting. She was found to have a pulmonary nodule. She is actually scheduled for right upper lobectomy and VATS.  In the hospital we tried to do a stress perfusion study. However, she was always too nauseated to complete this study. Since discharge this had resolved. She denies any ongoing chest pressure, neck or arm discomfort. She has not had any new shortness of breath, PND or orthopnea. She has had no new palpitations, presyncope or syncope. She has no weight gain or edema.  No Known Allergies  Current Outpatient Prescriptions  Medication Sig Dispense Refill  . acetaminophen (TYLENOL) 325 MG tablet Take 650 mg by mouth every 6 (six) hours as needed.        Marland Kitchen aspirin 81 MG tablet Take 81 mg by mouth daily.        . carvedilol (COREG) 12.5 MG tablet Take 1 tablet (12.5 mg total) by mouth 2 (two) times daily with a meal.  60 tablet  3  . citalopram (CELEXA) 20 MG tablet TAKE 1 TABLET BY MOUTH AT BEDTIME  30 tablet  2  . docusate sodium (COLACE) 100 MG capsule Take 100 mg by mouth daily.        . insulin glargine (LANTUS SOLOSTAR) 100 UNIT/ML injection Inject 40 Units into the skin at bedtime.       . Insulin Pen Needle (PEN NEEDLES) 31G X 8 MM MISC Use to inject insulin 4 times daily.      Marland Kitchen lisinopril (PRINIVIL,ZESTRIL) 20 MG tablet Take 10 mg by mouth daily.        Marland Kitchen ofloxacin (OCUFLOX) 0.3 % ophthalmic solution Place 1 drop into both eyes 4 (four) times daily.        Marland Kitchen oxyCODONE-acetaminophen (PERCOCET) 7.5-500 MG per tablet Take 1 tablet by mouth 3 (three) times daily as needed for pain.  90 tablet  0  . pravastatin (PRAVACHOL) 40 MG tablet Take 1 tablet (40 mg total) by mouth at bedtime.  30 tablet  5    Past Medical History  Diagnosis Date  . Diabetes mellitus 2007    HgA1C (02/20/2010) = 9.2, HgA1C (03/20/2009) = 12.1  . Hyperlipidemia   .  Hypertension   . GERD (gastroesophageal reflux disease)   . CVA (cerebrovascular accident) 2006     right embolic stroke, no residual deficits  . Diverticulitis   . CVA (cerebral infarction) 7-yrs ago  . Arthritis   . Dysphagia   . CHF (congestive heart failure)   . DIABETES MELLITUS, TYPE II 12/04/2005  . HYPERLIPIDEMIA 12/04/2005  . HYPERTENSION 12/04/2005  . GERD 12/04/2005  . ARTHRITIS, KNEE 03/24/2006  . Lung mass 07/08/2010  . Meningioma 07/21/2010  . Hemangioma of liver 12/02/2010  . Depression 12/02/2010    Past Surgical History  Procedure Date  . Abdominal hysterectomy   . Video bronchoscope. 12/29/2007    [place]  . Wide excision of left upper back mass.   . Extracapsular cataract extraction with intraocular     lens implantation.    ROS:  As stated in the HPI and negative for all other systems.  PHYSICAL EXAM BP 100/60  Pulse 70  Ht 5\' 4"  (1.626 m)  Wt 169 lb (76.658 kg)  BMI 29.01 kg/m2 GENERAL:  Well appearing HEENT:  Pupils equal round and reactive, fundi not visualized, oral mucosa unremarkable NECK:  No jugular venous  distention, waveform within normal limits, carotid upstroke brisk and symmetric, no bruits, no thyromegaly LYMPHATICS:  No cervical, inguinal adenopathy LUNGS:  Clear to auscultation bilaterally BACK:  No CVA tenderness CHEST:  Unremarkable HEART:  PMI not displaced or sustained,S1 and S2 within normal limits, no S3, no S4, no clicks, no rubs, no murmurs ABD:  Flat, positive bowel sounds normal in frequency in pitch, no bruits, no rebound, no guarding, no midline pulsatile mass, no hepatomegaly, no splenomegaly EXT:  2 plus pulses throughout, no edema, no cyanosis no clubbing SKIN:  No rashes no nodules NEURO:  Cranial nerves II through XII grossly intact, motor grossly intact throughout PSYCH:  Cognitively intact, oriented to person place and time  ASSESSMENT AND PLAN

## 2010-12-04 NOTE — Assessment & Plan Note (Signed)
She is due to have resection of this lung mass.  She will have the stress test preoperatively.

## 2010-12-04 NOTE — Assessment & Plan Note (Signed)
>>  ASSESSMENT AND PLAN FOR CHRONIC DIASTOLIC CONGESTIVE HEART FAILURE (HCC) WRITTEN ON 12/04/2010  1:40 PM BY HOCHREIN, JAMES, MD  At this point we have not evaluated the etiology of her cardiomyopathy.  I will send her back to the hospital to complete this study.  She seems to be euvolemic.  At this point, no change in therapy is indicated.  We have reviewed salt and fluid restrictions.  No further cardiovascular testing is indicated.  She will remain on the meds as listed.

## 2010-12-09 ENCOUNTER — Telehealth: Payer: Self-pay | Admitting: *Deleted

## 2010-12-09 NOTE — Telephone Encounter (Signed)
CALLED PT TO FOLLOW UP ON PT REFRRAL / OFFICE SAYS THEY SPOKE WITH PATIENT, PT OFFICE SAYS REFERRAL SHOULD WAIT TIL PATIENT HAS SURGERY. FAX WAS SENT TO [facility]. FAX WAS PLACE IN  DR ILLATH'S BOX TO BE REVIEWED BY HER.  LELA STURDIVANT NTII    10-23-012  3:00PM

## 2010-12-11 ENCOUNTER — Ambulatory Visit (HOSPITAL_BASED_OUTPATIENT_CLINIC_OR_DEPARTMENT_OTHER): Payer: PRIVATE HEALTH INSURANCE | Admitting: Radiology

## 2010-12-11 ENCOUNTER — Encounter (HOSPITAL_COMMUNITY)
Admission: RE | Admit: 2010-12-11 | Discharge: 2010-12-11 | Disposition: A | Discharge status: Home / Self Care | Payer: PRIVATE HEALTH INSURANCE | Source: Ambulatory Visit | Attending: Thoracic Surgery | Admitting: Thoracic Surgery

## 2010-12-11 VITALS — Ht 64.0 in | Wt 173.0 lb

## 2010-12-11 DIAGNOSIS — R0602 Shortness of breath: Secondary | ICD-10-CM

## 2010-12-11 DIAGNOSIS — I5022 Chronic systolic (congestive) heart failure: Secondary | ICD-10-CM

## 2010-12-11 LAB — COMPREHENSIVE METABOLIC PANEL
Albumin: 3.7 g/dL (ref 3.5–5.2)
BUN: 8 mg/dL (ref 6–23)
Chloride: 107 mEq/L (ref 96–112)
Creatinine, Ser: 0.55 mg/dL (ref 0.50–1.10)
Total Bilirubin: 0.3 mg/dL (ref 0.3–1.2)
Total Protein: 6.8 g/dL (ref 6.0–8.3)

## 2010-12-11 LAB — URINE MICROSCOPIC-ADD ON

## 2010-12-11 LAB — SURGICAL PCR SCREEN
MRSA, PCR: NEGATIVE
Staphylococcus aureus: NEGATIVE

## 2010-12-11 LAB — URINALYSIS, ROUTINE W REFLEX MICROSCOPIC
Glucose, UA: NEGATIVE mg/dL
Hgb urine dipstick: NEGATIVE
Specific Gravity, Urine: 1.016 (ref 1.005–1.030)
pH: 5 (ref 5.0–8.0)

## 2010-12-11 LAB — CBC
HCT: 33.9 % — ABNORMAL LOW (ref 36.0–46.0)
Hemoglobin: 10.6 g/dL — ABNORMAL LOW (ref 12.0–15.0)
WBC: 6.2 10*3/uL (ref 4.0–10.5)

## 2010-12-11 LAB — BLOOD GAS, ARTERIAL
Bicarbonate: 25.2 mEq/L — ABNORMAL HIGH (ref 20.0–24.0)
Patient temperature: 98.6
TCO2: 26.3 mmol/L (ref 0–100)
pCO2 arterial: 38.2 mmHg (ref 35.0–45.0)
pH, Arterial: 7.434 — ABNORMAL HIGH (ref 7.350–7.400)

## 2010-12-11 LAB — PROTIME-INR
INR: 1.11 (ref 0.00–1.49)
Prothrombin Time: 14.5 seconds (ref 11.6–15.2)

## 2010-12-11 MED ORDER — TECHNETIUM TC 99M TETROFOSMIN IV KIT
33.0000 | PACK | Freq: Once | INTRAVENOUS | Status: AC | PRN
  Administered 2010-12-11: 33 via INTRAVENOUS

## 2010-12-11 MED ORDER — TECHNETIUM TC 99M TETROFOSMIN IV KIT
11.0000 | PACK | Freq: Once | INTRAVENOUS | Status: AC | PRN
  Administered 2010-12-11: 11 via INTRAVENOUS

## 2010-12-11 MED ORDER — REGADENOSON 0.4 MG/5ML IV SOLN
0.4000 mg | Freq: Once | INTRAVENOUS | Status: AC
  Administered 2010-12-11: 0.4 mg via INTRAVENOUS

## 2010-12-11 NOTE — Progress Notes (Signed)
[facility] SITE 3 NUCLEAR MED [address] [place] [address] [phone]  Cardiology Nuclear Med Study  [name] is a 77 y.o. female [id number] 07/25/1933   Nuclear Med Background Indication for Stress Test:  Evaluation for Ischemia and 11/10/10 Post Hospital: N/V, HTN, Pending Right ^ Lobectomy History: 5/12  Echo EF 15-20% and Myocardial Perfusion Study ( Rest Only @ [facility]-thinning of apex) Cardiac Risk Factors: CVA, History of Smoking, Hypertension, IDDM Type 2, Lipids and TIA  Symptoms:  DOE and Palpitations   Nuclear Pre-Procedure Caffeine/Decaff Intake:  None NPO After: 7:00pm   Lungs:  Clear IV 0.9% NS with Angio Cath:  22g  IV Site: R Antecubital  IV Started by:  Stanton Kidney, EMT-P  Chest Size (in):  42 Cup Size: D  Height: 5\' 4"  (1.626 m)  Weight:  173 lb (78.472 kg)  BMI:  Body mass index is 29.70 kg/(m^2). Tech Comments:  Coreg Held > 24 hours, per patient. CBG= 115 @ 8:30 am per patient.    Nuclear Med Study 1 or 2 day study: 1 day  Stress Test Type:  Eugenie Birks  Reading MD: Charlton Haws, MD  Order Authorizing Provider:  Dr. Daiva Nakayama  Resting Radionuclide: Technetium 75m Tetrofosmin  Resting Radionuclide Dose: 11.0 mCi   Stress Radionuclide:  Technetium 85m Tetrofosmin  Stress Radionuclide Dose: 33.0 mCi           Stress Protocol Rest HR: 80 Stress HR: 99  Rest BP: 196/92 Stress BP: 182/58  Exercise Time (min): n/a METS: n/a   Predicted Max HR: 145 bpm % Max HR: 68.28 bpm Rate Pressure Product: 21308   Dose of Adenosine (mg):  n/a Dose of Lexiscan: 0.4 mg  Dose of Atropine (mg): n/a Dose of Dobutamine: n/a mcg/kg/min (at max HR)  Stress Test Technologist: Bonnita Levan, RN  Nuclear Technologist:  Domenic Polite, CNMT     Rest Procedure:  Myocardial perfusion imaging was performed at rest 45 minutes following the intravenous administration of Technetium 59m Tetrofosmin. Rest ECG: NSR, no change from previous EKG  Stress  Procedure:  The patient received IV Lexiscan 0.4 mg over 15-seconds.  Technetium 21m Tetrofosmin injected at 30-seconds.  There were no significant changes with Lexiscan, occ. PVC noted.  Quantitative spect images were obtained after a 45 minute delay. Stress ECG: No significant change from baseline ECG  QPS Raw Data Images:  Patient motion noted. Stress Images:  Normal homogeneous uptake in all areas of the myocardium. Rest Images:  Normal homogeneous uptake in all areas of the myocardium. Subtraction (SDS):  Normal Transient Ischemic Dilatation (Normal <1.22):  1.10 Lung/Heart Ratio (Normal <0.45):  0.39  Quantitative Gated Spect Images QGS EDV:  119 ml QGS ESV:  71 ml QGS cine images:  EF 40% with diffuse hypokinesis and abnormal septal motion QGS EF: 40%  Impression Exercise Capacity:  Lexiscan with no exercise. BP Response:  Normal blood pressure response. Clinical Symptoms:  No chest pain. ECG Impression:  Baseline IVCD Comparison with Prior Nuclear Study: No images to compare  Overall Impression:  Low risk stress nuclear study.  No ischemia or infarct.  EF 40% consistent with nonischemic DCM   Charlton Haws

## 2010-12-15 ENCOUNTER — Other Ambulatory Visit: Payer: Self-pay | Admitting: Thoracic Surgery

## 2010-12-15 ENCOUNTER — Inpatient Hospital Stay (HOSPITAL_COMMUNITY)
Admission: RE | Admit: 2010-12-15 | Discharge: 2010-12-20 | DRG: 164 | Disposition: A | Discharge status: Home Health | Payer: PRIVATE HEALTH INSURANCE | Source: Ambulatory Visit | Attending: Thoracic Surgery | Admitting: Thoracic Surgery

## 2010-12-15 ENCOUNTER — Inpatient Hospital Stay (HOSPITAL_COMMUNITY): Payer: PRIVATE HEALTH INSURANCE

## 2010-12-15 ENCOUNTER — Ambulatory Visit (HOSPITAL_COMMUNITY)
Admission: RE | Admit: 2010-12-15 | Discharge: 2010-12-15 | Disposition: A | Discharge status: Home / Self Care | Payer: PRIVATE HEALTH INSURANCE | Source: Ambulatory Visit | Attending: Thoracic Surgery | Admitting: Thoracic Surgery

## 2010-12-15 DIAGNOSIS — J4489 Other specified chronic obstructive pulmonary disease: Secondary | ICD-10-CM | POA: Diagnosis present

## 2010-12-15 DIAGNOSIS — E8779 Other fluid overload: Secondary | ICD-10-CM | POA: Diagnosis not present

## 2010-12-15 DIAGNOSIS — I5022 Chronic systolic (congestive) heart failure: Secondary | ICD-10-CM | POA: Diagnosis present

## 2010-12-15 DIAGNOSIS — Z794 Long term (current) use of insulin: Secondary | ICD-10-CM

## 2010-12-15 DIAGNOSIS — E785 Hyperlipidemia, unspecified: Secondary | ICD-10-CM | POA: Diagnosis present

## 2010-12-15 DIAGNOSIS — Z7982 Long term (current) use of aspirin: Secondary | ICD-10-CM

## 2010-12-15 DIAGNOSIS — I428 Other cardiomyopathies: Secondary | ICD-10-CM | POA: Diagnosis present

## 2010-12-15 DIAGNOSIS — I10 Essential (primary) hypertension: Secondary | ICD-10-CM | POA: Diagnosis present

## 2010-12-15 DIAGNOSIS — I509 Heart failure, unspecified: Secondary | ICD-10-CM | POA: Diagnosis present

## 2010-12-15 DIAGNOSIS — F329 Major depressive disorder, single episode, unspecified: Secondary | ICD-10-CM | POA: Diagnosis present

## 2010-12-15 DIAGNOSIS — M1990 Unspecified osteoarthritis, unspecified site: Secondary | ICD-10-CM | POA: Diagnosis present

## 2010-12-15 DIAGNOSIS — C3410 Malignant neoplasm of upper lobe, unspecified bronchus or lung: Secondary | ICD-10-CM

## 2010-12-15 DIAGNOSIS — Z87891 Personal history of nicotine dependence: Secondary | ICD-10-CM

## 2010-12-15 DIAGNOSIS — Z01812 Encounter for preprocedural laboratory examination: Secondary | ICD-10-CM

## 2010-12-15 DIAGNOSIS — R1310 Dysphagia, unspecified: Secondary | ICD-10-CM | POA: Diagnosis present

## 2010-12-15 DIAGNOSIS — K219 Gastro-esophageal reflux disease without esophagitis: Secondary | ICD-10-CM | POA: Diagnosis present

## 2010-12-15 DIAGNOSIS — F3289 Other specified depressive episodes: Secondary | ICD-10-CM | POA: Diagnosis present

## 2010-12-15 DIAGNOSIS — E119 Type 2 diabetes mellitus without complications: Secondary | ICD-10-CM | POA: Diagnosis present

## 2010-12-15 DIAGNOSIS — J449 Chronic obstructive pulmonary disease, unspecified: Secondary | ICD-10-CM | POA: Diagnosis present

## 2010-12-15 DIAGNOSIS — D62 Acute posthemorrhagic anemia: Secondary | ICD-10-CM | POA: Diagnosis not present

## 2010-12-15 DIAGNOSIS — R918 Other nonspecific abnormal finding of lung field: Secondary | ICD-10-CM

## 2010-12-15 DIAGNOSIS — Z8673 Personal history of transient ischemic attack (TIA), and cerebral infarction without residual deficits: Secondary | ICD-10-CM

## 2010-12-15 LAB — GLUCOSE, CAPILLARY
Glucose-Capillary: 118 mg/dL — ABNORMAL HIGH (ref 70–99)
Glucose-Capillary: 181 mg/dL — ABNORMAL HIGH (ref 70–99)
Glucose-Capillary: 167 mg/dL — ABNORMAL HIGH (ref 70–99)
Glucose-Capillary: 134 mg/dL — ABNORMAL HIGH (ref 70–99)
Glucose-Capillary: 117 mg/dL — ABNORMAL HIGH (ref 70–99)

## 2010-12-15 NOTE — Discharge Summary (Signed)
[name], Burns NO.:  [id number]  MEDICAL RECORD NO.:  [id number]  LOCATION:                                 FACILITY:  PHYSICIAN:  [name], M.D.    DATE OF BIRTH:  [birth year]  DATE OF ADMISSION:11/10/2010 DATE OF DISCHARGE:11/23/2010                              DISCHARGE SUMMARY   DISCHARGE DIAGNOSES: 1. Chronic systolic congestive heart failure with ejection fraction of     15-20%. 2. Hypokalemia secondary to decreased p.o. intake and dehydration,     controlled. 3. Type 2 diabetes mellitus. 4. 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 5. Hyperlipidemia. 6. Depression. 7. Dysphagia. 8. HTN hypertension  9.History of cerebrovascular accident in 2004. 10.History of transient ischemic attack in May 2012. 11.Gastroesophageal reflux disease. 12.Arthritis. 13.Nausea/emesis  DISCHARGE MEDICATIONS: 1. Citalopram 20 mg p.o. daily. 2. Aspirin 81 mg p.o. daily. 3. Lisinopril 10 mg at bedtime. 4. Coreg 12.5 mg b.i.d. 5. Simvastatin 20 mg p.o. daily. 6. Ofloxacin eye 0.3% solution q.i.d. 7. Colace 100 mg daily. 8. Acetaminophen 325 mg q.6 h. p.r.n. for pain. 9. Lantus 40 units at bedtime. 10.Percocet 7.5/500 mg q.8 h. p.r.n. for pain.  DISPOSITION AND FOLLOWUP:  The patient is discharged to home.  Please follow up with PCP, Dr. Loistine Burns at the [facility] on December 02, 2010 at 3:15 p.m.  Please check CBC and metabolic panel, and arrange for Home Health PT, phone number [phone].  Please follow up with Dr. Edwyna Burns on November 26, 2010 at 9 am and bring med list, phone number [phone].  Please follow up with [facility] in 1-2 weeks. Please schedule an appointment, the phone number is [phone].  PROCEDURES/IMAGING: 1. On November 09, 2010, acute abdomen series shows spiculated mass     in right upper lobe which is enlarged since last in May 2010.     Concern for BAC.  Paucity of bowel  gas. 2. On November 12, 2010, CT of chest without contrast.  Mass in the     right upper lobe increasing in size and density.  Three faint lesions in the vertebral     bodies; cannot rule out mass. 3. On November 13, 2010, MRI of head with and without contrast, no     acute changes.  Atrophy throughout.  Frontal meningioma unchanged. 4. On November 12, 2010, a 2-D echo with contrast; ejection fraction     15-20%, mild LVH, mild MR. 5. On November 16, 2010, myocardial imaging with SPECT.  Thinning at     the apex, inferior apex, anterior apex.  Unable to complete due to     the patient's nausea and vomiting. 6. On November 18, 2010, PET scan.  Interval increase in size and     metabolic activity of right upper lobe mass over several years -     concerning for low-grade malignancy such as BAC.  No evidence of     mets.  Enlarging hemangioma in right hepatic lobe. 7. On November 19, 2010, barium swallow test; no gross abnormalities. 8. On November 20, 2010, CT-guided fine needle aspiration and core     biopsy of right upper lobe  lung mass and the pathology for this     showed atypical cells and was suggestive of malignancy, but was     not confirmative.  CONSULTATIONS: 1. Cardiology for evaluation of CHF and ejection fraction. 2. Pulmonology for evaluation of pulmonary nodules.  BRIEF ADMITTING HISTORY AND PHYSICAL:    Chief complaint is vomiting for 1 day.  The patient is a 77 year old female with past medical history of diabetes mellitus diagnosed in 2007, hyperlipidemia, hypertension, GERD, CVA in 2006, diverticulitis, arthritis, and dysphagia.  She presents to [facility] for evaluation and vomiting for 1 day duration.  The patient denies any headache, neck pain, fever, chills, vision or speech deficit, paresthesias, weakness, chest pain, shortness of breath, heart racing, abdominal pain, heartburn, diarrhea, melena, abdominal distention or leg swelling, pain or rashes.  She denies eating  any suspicious foods or exposure to ill persons.  She denies any similar symptoms in the past. She reports compliance with her medication regimen, although was unable to name any of them.  She states that her daughter administers all of her medications.  PHYSICAL EXAMINATION:    ADMITTING VITAL SIGNS:  Temperature 99.1, pulse 104, blood pressure 208/111, respirations 18, O2 sat 100% on room air. GENERAL:  Alert, cooperative, no distress.  Appears stated age. HEENT:  Head is normocephalic without obvious abnormality, atraumatic. Eyes; PERRL.  Conjunctiva, cornea clear.  EOMs intact.  Fundi benign, both eyes.  Ears; normal tympanic membranes in external ear canal, both ears.  Nose; nares normal, septum midline.  Mucosa normal.  No drainage or sinus tenderness.  Throat, lips, mucosa, and tongue normal.  Teeth in fair repair. NECK:  Supple, symmetrical.  Trachea midline.  No adenopathy.  Thyroid: No enlargement/tenderness/nodules; right carotid bruit appreciated; no JVD. BACK:  No CVA tenderness. LUNGS:  Clear to auscultation bilaterally.  Respirations unlabored. CHEST:  No tenderness or deformity. HEART:  Tachycardiac, normal rhythm, no murmurs or rubs. ABDOMEN:  Soft, nontender.  Bowel sounds are active in all 4 quadrants. No masses.  No organomegaly. EXTREMITIES:  Full range of motion proximally and distally of upper and lower extremities bilaterally.  Pulses are 2+ and symmetric in all extremities. SKIN:  Color, texture, and turgor are normal.  No rashes or lesions. NEUROLOGIC:  Cranial nerves II through XII intact.  Sensory exam is without deficit.  No Babinski bilaterally.  Deep tendon reflexes are 2+/4 bilaterally.  No focal deficits. LYMPH NODES:  Cervical, supraclavicular and axillary nodes areas normal.  LABORATORY RESULTS:   Basic metabolic panel; sodium 138, potassium 3.4, chloride 97, bicarb 28, glucose 173, BUN 10, creatinine 0.49, calcium 9.8.   Liver function test,  AST 13, ALT 12, alk phos 69, total bilirubin 0.5, protein 8.6, albumin 4.5, lipase 21.  CBC; white blood cells 6.6, neutrophils 5.1, hemoglobin 13.1, hematocrit 41.4, MCV 83, platelets 380.  IMAGING:  There was an acute abdomen series, which showed a persistent spiculated mass in the right upper lobe measuring approximately 3.9 cm. This may be minimally increased in size from the prior PET/CT performed in 2009, this raises concern for a BAC, though an inflammatory process could have a similar appearance.  HOSPITAL COURSE:  Jasime is a 77 year old female with past medical history of uncontrolled hypertension, type 2 diabetes, cerebrovascular accident in 2004 and transient ischemic attack in 2012, who presented with nausea and vomiting for 1 day duration. 1. Nausea/emesis most likely secondary to acute gastritis.  The     patient presented with nausea and vomiting x1 day  duration.  The     patient vomited a few times during her hospital stay, but remains     nauseated throughout.  The patient was placed on a clear liquid     diet.  We corrected the patient's hypokalemia (most likely     secondary to emesis) with KCl p.o. and IV.  We replaced the fluid     deficit with normal saline (the patient received several boluses as     well as maintenance dose).  We rehydrated gently as the patient has     comorbid congestive heart failure.  Metabolic panel was measured     every morning and monitored closely for any indication of kidney     failure or electrolyte abnormalities.  The patient received protonix and Zofran     4 mg p.r.n. for nausea.  During this admission, she was unable to     eat, but was eventually placed on a regular diet on hospital day     #5.  She continued to have poor p.o. intake throughout the hospital     course.  She eventually regained her appetite and we were able to     discharge her. 2. Chronic congestive heart failure.  The patient had an echo     in May 2012  that showed an ejection fraction of 15-20%.  The     patient had a few episodes of ventricular tachycardia in the     hospital, so we scheduled a repeat echo; this showed ejection     fraction of 15-20%, ventricular hypertrophy and mild mitral     regurgitation.  The patient was monitored clinically for any signs     of acute congestive heart failure exacerbation (JVD, pitting edema,     shortness of breath, or pulmonary edema).  She was treated with     Coreg, lisinopril, and ASA 81 mg daily.  The patient was placed on     telemetry for the duration of her stay.  Cardiology was consulted     to evaluate.  The patient had a Lexiscan Myoview per Cardiology     records, which showed diffuse myocardial thinning.  Cardiology     recommended optimizing medical management at this time. 3. Hypokalemia (resolved).  The patient had a potassium of 2.4 on     November 12, 2010.  We suspect that this was most likely secondary     to decreased p.o. intake and vomiting prior to admission.  The     patient also experienced a few episodes of ventricular tachycardia     likely secondary to hypokalemia.  She was given KCl IV and p.o. as     needed.  We evaluated her metabolic panel each morning.  Magnesium     level was checked as sometimes hypomagnesemia can occur with     hypokalemia.  The patient continued to have multiple instances of     hypokalemia - each was treated appropriately.  As p.o. intake     increased, the frequency of low potassium level decreased and resolved before discharge. 4. HTN: hypertension:  The patient presented with a systolic blood     pressure of greater than 200.  She had several following incidents     of systolic blood pressure greater than 200, which she was     given 10 mg of hydralazine.  She was treated with lisinopril,     Coreg, and baby aspirin daily.  Blood pressures were measured  initially q.4 hours, then twice daily.  We did not decrease her     blood  pressure too quickly due to her previous history of stroke.     We also suspect that the patient has a chronic uncontrolled     hypertension as multiple records report stage III hypertension.  We     set a goal SBP of 170 throughout the admission.  Due to the     patient's hypovolemia, orthostatics were monitored, and     antihypertensives were titrated up very slightly.  The patient was     discharged with instructions to follow up with PCP who will assess     blood pressure status and make necessary med adjustments.  Goal     blood pressure as lowest possible without symptoms and goal heart     rate is 55-60 without symptoms.  The Atlas trial demonstrated the     high doses of ACE inhibitors are more effective than low doses in     treating congestive heart failure and reducing hospitalizations;     thus, the patient's therapeutic goal is lisinopril 40 mg. 5. Controlled type 2 diabetes.  The patient reportedly has had     hemoglobin A1c level is greater than 8 in previous admission.     Hemoglobin A1c during this admission was 6.7%, indicating a well-     controlled status.  The patient was given NovoLog 3 units a.c. plus     sliding scale insulin to control blood sugars.  CBGs were monitored     per protocol and were 200 throughout hospitalization.  The patient     should resume her home med regimen of Lantus upon discharge. 6. Pulmonary nodule (probable malignancy - bronchioloalveolar     carcinoma).  Chest CT on November 12, 2010, showed right upper     lobe spiculated mass that had enlarged since previous CT in 2010.     CT also indicated possible mets to vertebrals bodies, mass was     suspicious for malignancy (specifically bronchioloalveolar     carcinoma) due to 30-pack-year smoking history.  We consulted     Pulmonology, Dr. Edwyna Burns, who had been following the     patient for few years; she was advised to schedule a follow up CT     in 2010, but never did.  She states that this  was due to insurance     issues.  PET scan showed no mets, but a hypermetabolic lung mass     suspicious for cancer.  CT-guided core biopsy and fine needle     aspiration showed atypical cells, but the final diagnosis was     inconclusive.  Dr. Edwyna Burns will follow up with the patient. 7. Depression.  The patient has a history of depression.  She was     fatigued and somnolent during admission with a decreased appetite.     She scored an 8 on the geriatric depression scale indicating normal     mood; however, throughout most of the stay, she was anhedonic with     a flat affect.  We continued her citalopram 20 mg. 8. Hyperlipidemia.  The patient has history of uncontrolled     hyperlipidemia.  Lipid panel on November 10, 2010, revealed LDL of     147 and total cholesterol of 213.  The patient takes pravastatin 40     mg p.o. daily at home; she was started on simvastatin 20 mg p.o.  daily.  PCP will need to reassess meds for better control of     hyperlipidemia. 9. Gastroesophageal reflux disease.  The patient past medical history     of gastroesophageal reflux disease.  She was given Protonix 40 mg     p.o. daily throughout hospitalization and reported no symptoms of     chest pain or reflux. 10.Arthritis.  The patient reports arthritis in her right shoulder and     right knee.  She experienced pain in her right shoulder, hip and     knee during admission.  She was given Tylenol 325 mg p.o. p.r.n.     for pain. 11.History of cerebrovascular accident in 2004 and transient ischemic     attack in 2012.  We were concerned about possible stroke during     this admission due to patient's history.  Neuro exams were nonfocal     and unchanged throughout the patient's hospital stay, and MRI of     the head showed no acute processes. 12.Dysphagia.  The patient complained of difficulty swallowing.  She     felt as though she had a lump in her throat.  We did a barium     swallow, which showed  no gross abnormalities.  Barium swallow few     months ago showed strictures and dilations.  The patient's symptoms     resolved during her hospital stay.  We did no further workup.  DISCHARGE LABORATORY DATA AND VITAL SIGNS:   Temperature 97.7, pulse 77, respirations 20, blood pressure 114/66, O2 sat 100% on room air.   CBC; white blood cells 6.3, hemoglobin 10.1, hematocrit 32.1, MCV 82.1, platelets 205.  Metabolic panel; sodium 138, potassium 4.1, chloride 104, bicarb 26, BUN 10, creatinine 0.75, glucose 97, calcium 8.8.    ______________________________ Lorretta Harp, MD   ______________________________ [name], M.D.    XN/MEDQ  D:  11/24/2010  T:  11/24/2010  Job:  [id number]  cc:   Almyra Deforest, MD  Electronically Signed by Lorretta Harp MD on 12/04/2010 07:02:39 PM Electronically Signed by [name] M.D. on 12/15/2010 03:18:54 PM

## 2010-12-16 ENCOUNTER — Inpatient Hospital Stay (HOSPITAL_COMMUNITY): Payer: PRIVATE HEALTH INSURANCE

## 2010-12-16 LAB — POCT I-STAT 3, ART BLOOD GAS (G3+)
Bicarbonate: 27.8 mEq/L — ABNORMAL HIGH (ref 20.0–24.0)
pCO2 arterial: 42.8 mmHg (ref 35.0–45.0)
pO2, Arterial: 128 mmHg — ABNORMAL HIGH (ref 80.0–100.0)

## 2010-12-16 LAB — GLUCOSE, CAPILLARY
Glucose-Capillary: 137 mg/dL — ABNORMAL HIGH (ref 70–99)
Glucose-Capillary: 85 mg/dL (ref 70–99)
Glucose-Capillary: 103 mg/dL — ABNORMAL HIGH (ref 70–99)
Glucose-Capillary: 102 mg/dL — ABNORMAL HIGH (ref 70–99)

## 2010-12-16 LAB — BASIC METABOLIC PANEL
BUN: 8 mg/dL (ref 6–23)
CO2: 26 mEq/L (ref 19–32)
Chloride: 103 mEq/L (ref 96–112)
Glucose, Bld: 111 mg/dL — ABNORMAL HIGH (ref 70–99)
Potassium: 2.8 mEq/L — ABNORMAL LOW (ref 3.5–5.1)
Sodium: 137 mEq/L (ref 135–145)

## 2010-12-16 LAB — CBC
HCT: 32.1 % — ABNORMAL LOW (ref 36.0–46.0)
Hemoglobin: 10.1 g/dL — ABNORMAL LOW (ref 12.0–15.0)
MCV: 82.5 fL (ref 78.0–100.0)
WBC: 8.9 10*3/uL (ref 4.0–10.5)

## 2010-12-16 LAB — PHOSPHORUS: Phosphorus: 4.2 mg/dL (ref 2.3–4.6)

## 2010-12-17 ENCOUNTER — Other Ambulatory Visit: Payer: Self-pay | Admitting: *Deleted

## 2010-12-17 ENCOUNTER — Inpatient Hospital Stay (HOSPITAL_COMMUNITY): Payer: PRIVATE HEALTH INSURANCE

## 2010-12-17 DIAGNOSIS — F329 Major depressive disorder, single episode, unspecified: Secondary | ICD-10-CM

## 2010-12-17 LAB — COMPREHENSIVE METABOLIC PANEL
ALT: 7 U/L (ref 0–35)
Alkaline Phosphatase: 43 U/L (ref 39–117)
BUN: 9 mg/dL (ref 6–23)
CO2: 27 mEq/L (ref 19–32)
Calcium: 8.5 mg/dL (ref 8.4–10.5)
GFR calc Af Amer: >90 mL/min (ref >90)
GFR calc non Af Amer: 85 mL/min — ABNORMAL LOW (ref >90)
Glucose, Bld: 81 mg/dL (ref 70–99)
Sodium: 138 mEq/L (ref 135–145)

## 2010-12-17 LAB — GLUCOSE, CAPILLARY: Glucose-Capillary: 81 mg/dL (ref 70–99)

## 2010-12-17 LAB — CBC
HCT: 29.9 % — ABNORMAL LOW (ref 36.0–46.0)
Hemoglobin: 9.2 g/dL — ABNORMAL LOW (ref 12.0–15.0)
MCH: 25.7 pg — ABNORMAL LOW (ref 26.0–34.0)
MCV: 83.5 fL (ref 78.0–100.0)
RBC: 3.58 MIL/uL — ABNORMAL LOW (ref 3.87–5.11)

## 2010-12-17 LAB — PRO B NATRIURETIC PEPTIDE: Pro B Natriuretic peptide (BNP): 351.6 pg/mL (ref 0–450)

## 2010-12-17 NOTE — H&P (Signed)
[name], [name]              ACCOUNT NO.:  [id number]  MEDICAL RECORD NO.:  [id number]  LOCATION:                                 FACILITY:  PHYSICIAN:  Ines Bloomer, M.D. DATE OF BIRTH:  04-01-1933  DATE OF ADMISSION:  12/15/2010 DATE OF DISCHARGE:                             HISTORY & PHYSICAL   CHIEF COMPLAINT:  Lung mass.  This 77 year old African American female was seen in 2010 with a right upper lobe mass.  An initial PET scan was negative and we followed with serial CT scans, but she was lost to follow up and initial CT scan was negative.  She then was readmitted by the family practice for nausea, vomiting, and gastroenteritis as well as chronic systolic heart failure. She was found to have a right upper lobe mass that increased in size and density.  A needle biopsy done, which showed non-small cell lung cancer. Her initial biopsies in 2009 were negative.  PET scan done and repeated here was positive in this area.  She had a low-grade activity.  There were no mediastinal adenopathy.  She is being admitted for right upper lobectomy.  Her pulmonary function tests were satisfactory for a right upper lobectomy with an FEV-1 of 30% of predicted and an FVC of 80% of predicted and normal diffusion capacity.  MEDICATIONS: 1. Citalopram 20 mg a day. 2. Aspirin. 3. Lisinopril 10 mg at bedtime. 4. Coreg 12.5 mg twice a da. 5. Simvastatin 20 mg daily. 6. Colace. 7. Acetaminophen. 8. Lantus 40 mg daily. 9. Percocet. She has hyperlipidemia, depression, hypertension, had a history of cerebrovascular accident in 2004, transient ischemic attack in May 2012, gastroesophageal reflux disease, arthritis and diabetes mellitus type 2.  REVIEW OF SYSTEMS:  CARDIAC:  No angina, but congestive heart failure. PULMONARY:  No hemoptysis.  GI:  GERD, dysphagia.  GU:  No kidney disease, dysuria, or frequent urination.  Vascular pain in her leg with walking.  Previous TIA.  See past  medical history.  NEUROLOGIC:  No dizziness or headaches.  MUSCULOSKELETAL:  Arthritis.  PSYCHIATRIC:  See past medical history.  EYE/ENT:  No changes in eyesight or hearing. HEMATOLOGICAL:  Anemia.  No problems with bleeding or clotting disorders.  PHYSICAL EXAMINATION:  VITAL SIGNS:  Her blood pressure was 170/80, respirations were 18, pulse was 90. GENERAL:  She is a well-developed [place] American female, in no acute distress. HEAD:  Atraumatic. EYES:  Pupils equal, reactive to light and accommodation.  Extraocular movements are normal. EARS:  Tympanic membranes are intact. NOSE:  There is no septal deviation. NECK:  Supple without thyromegaly.  There is no supraclavicular or axillary adenopathy. CHEST:  Clear to auscultation and percussion. HEART:  Regular sinus rhythm.  No murmurs. ABDOMEN:  Soft.  There is no hepatosplenomegaly. EXTREMITIES:  Pulses are 1+.  There is 1+ edema. NEUROLOGIC:  She is oriented x3.  Sensory and motor intact.  Cranial nerves intact. SKIN:  Dry.  IMPRESSION: 1. Enlarging right upper lobe mass, probable non-small cell lung     cancer. 2. Hyperlipidemia. 3. Systolic congestive heart failure. 4. Diabetes mellitus type 2. 5. Hyperlipidemia. 6. Depression. 7. Dysphagia. 8. Hypertension. 9. History  of transient ischemic attack. 10.History of cerebrovascular accident. 11.Arthritis. 12.Gastroesophageal reflux disease.  PLAN:  Right VATS, right upper lobectomy.     Ines Bloomer, M.D.     DPB/MEDQ  D:  12/10/2010  T:  12/11/2010  Job:  [id number]  Electronically Signed by Jovita Gamma M.D. on 12/17/2010 05:01:28 PM

## 2010-12-17 NOTE — Op Note (Signed)
  [name], [name]              ACCOUNT NO.:  [id number]  MEDICAL RECORD NO.:  [id number]  LOCATION:  XRAY                         FACILITY:  [facility]  PHYSICIAN:  Ines Bloomer, M.D. DATE OF BIRTH:  September 07, 1933  DATE OF PROCEDURE: DATE OF DISCHARGE:                              OPERATIVE REPORT   PREOPERATIVE DIAGNOSIS:  Right upper lobe adenocarcinoma.  POSTOP DIAGNOSIS:  Right upper lobe adenocarcinoma.  OPERATIONS:  Right video-assisted thoracoscopy, right thoracotomy, right lower lobectomy with node dissection.  SURGEON:  Ines Bloomer, MD.  FIRST ASSISTANT:  Rowe Clack, PA-C.  ANESTHESIA:  General anesthesia.  DESCRIPTION OF PROCEDURE:  After percutaneous insertion of all monitor lines, the patient underwent general anesthesia.  He was prepped and draped in usual sterile manner.  The patient was turned to the right lateral thoracotomy position.  A dual-lumen tube was inserted.  Two trocar sites were made in the anterior and posterior axillary line at the 7th intercostal space.  Two trocars were inserted.  A 0-degree scope was inserted and we could see that the patient had a fairly complete fissures.  We made an incision over the triangle auscultation partially dividing the latissimus, reflecting the serratus anteriorly and entering the 5th intercostal space putting 2 Tuffier's at right angles. Dissection was started superiorly, dissecting out some 4R nodes and dissecting out the apical posterior branch, was stapled and divided with the Covidien stapler.  We then dissected out the superior pulmonary vein, stapled and divided with the Covidien stapler, taking some 10R nodes.  Attention was then turned posteriorly dissecting out the bronchus and taking more 10R nodes.  The superior portion of the fissure was divided with electrocautery and this exposed the posterior branch and this was stapled with the Covidien brace stapler.  We then divided the minor fissure  with Covidien 60 purple stapler with Peri-strips reinforcements.  Finally, we dissected up the section of more 10R nodes and small 11R nodes from around the bronchus and then stapled the bronchus with the TA-30 and divided it distally.  Margins were negative and frozen section was negative.  The inferior pulmonary ligament was taken down dissecting out some 9R nodes and then we dissected out some 7 nodes from the subcarinal space.  Two chest tubes were brought into the trocar sites and put in place with 0 silk.  A Marcaine block was done in usual fashion.  Single On-Q was opened in the usual fashion.  Chest was closed with 3 pericostals drilling through the 6th rib passing around the 5th rib, 1 Vicryl in the muscle layer, 2-0 Vicryl in the subcutaneous tissue, and Dermabond for the skin.  The patient was returned to the recovery room in stable condition.     Ines Bloomer, M.D.     DPB/MEDQ  D:  12/15/2010  T:  12/15/2010  Job:  [id number]  Electronically Signed by Jovita Gamma M.D. on 12/17/2010 05:01:03 PM

## 2010-12-18 ENCOUNTER — Inpatient Hospital Stay (HOSPITAL_COMMUNITY): Payer: PRIVATE HEALTH INSURANCE

## 2010-12-18 LAB — BASIC METABOLIC PANEL
BUN: 14 mg/dL (ref 6–23)
Creatinine, Ser: 0.64 mg/dL (ref 0.50–1.10)
GFR calc Af Amer: >90 mL/min (ref >90)
GFR calc non Af Amer: 85 mL/min — ABNORMAL LOW (ref >90)
Potassium: 4.3 mEq/L (ref 3.5–5.1)

## 2010-12-18 LAB — PRO B NATRIURETIC PEPTIDE: Pro B Natriuretic peptide (BNP): 417.9 pg/mL (ref 0–450)

## 2010-12-18 LAB — GLUCOSE, CAPILLARY: Glucose-Capillary: 115 mg/dL — ABNORMAL HIGH (ref 70–99)

## 2010-12-18 MED ORDER — CITALOPRAM HYDROBROMIDE 20 MG PO TABS: 20.0000 mg | ORAL_TABLET | Freq: Every day | ORAL | Status: DC

## 2010-12-19 ENCOUNTER — Inpatient Hospital Stay (HOSPITAL_COMMUNITY): Payer: PRIVATE HEALTH INSURANCE

## 2010-12-19 LAB — GLUCOSE, CAPILLARY
Glucose-Capillary: 115 mg/dL — ABNORMAL HIGH (ref 70–99)
Glucose-Capillary: 76 mg/dL (ref 70–99)
Glucose-Capillary: 92 mg/dL (ref 70–99)

## 2010-12-20 ENCOUNTER — Inpatient Hospital Stay (HOSPITAL_COMMUNITY): Payer: PRIVATE HEALTH INSURANCE

## 2010-12-20 LAB — BASIC METABOLIC PANEL
CO2: 31 mEq/L (ref 19–32)
Glucose, Bld: 73 mg/dL (ref 70–99)
Potassium: 3.6 mEq/L (ref 3.5–5.1)
Sodium: 139 mEq/L (ref 135–145)

## 2010-12-20 LAB — GLUCOSE, CAPILLARY: Glucose-Capillary: 82 mg/dL (ref 70–99)

## 2010-12-20 MED ORDER — INSULIN ASPART 100 UNIT/ML ~~LOC~~ SOLN: 0.0000 [IU] | Freq: Three times a day (TID) | SUBCUTANEOUS | Status: DC

## 2010-12-20 MED ORDER — ASPIRIN EC 81 MG PO TBEC
81.0000 mg | DELAYED_RELEASE_TABLET | Freq: Every day | ORAL | Status: DC
  Filled 2010-12-20 (×2): qty 1

## 2010-12-20 MED ORDER — TRAMADOL HCL 50 MG PO TABS: 50.0000 mg | ORAL_TABLET | Freq: Four times a day (QID) | ORAL | Status: DC | PRN

## 2010-12-20 MED ORDER — FENTANYL CITRATE 0.05 MG/ML IJ SOLN: 25.0000 ug | INTRAMUSCULAR | Status: DC | PRN

## 2010-12-20 MED ORDER — SENNOSIDES-DOCUSATE SODIUM 8.6-50 MG PO TABS
1.0000 | ORAL_TABLET | Freq: Every evening | ORAL | Status: DC | PRN
  Filled 2010-12-20: qty 1

## 2010-12-20 MED ORDER — DOCUSATE SODIUM 100 MG PO CAPS: 100.0000 mg | ORAL_CAPSULE | Freq: Every day | ORAL | Status: DC

## 2010-12-20 MED ORDER — POTASSIUM CHLORIDE CRYS ER 20 MEQ PO TBCR: 20.0000 meq | EXTENDED_RELEASE_TABLET | Freq: Every day | ORAL | Status: DC

## 2010-12-20 MED ORDER — OFLOXACIN 0.3 % OP SOLN
1.0000 [drp] | Freq: Four times a day (QID) | OPHTHALMIC | Status: DC
  Filled 2010-12-20: qty 5

## 2010-12-20 MED ORDER — BISACODYL 5 MG PO TBEC: 10.0000 mg | DELAYED_RELEASE_TABLET | Freq: Every day | ORAL | Status: DC

## 2010-12-20 MED ORDER — SODIUM CHLORIDE 0.9 % IJ SOLN: 10.0000 mL | INTRAMUSCULAR | Status: DC | PRN

## 2010-12-20 MED ORDER — POTASSIUM CHLORIDE 10 MEQ/50ML IV SOLN
10.0000 meq | INTRAVENOUS | Status: DC | PRN
  Filled 2010-12-20: qty 50

## 2010-12-20 MED ORDER — OXYCODONE HCL 5 MG PO TABS: 5.0000 mg | ORAL_TABLET | ORAL | Status: DC | PRN

## 2010-12-20 MED ORDER — INSULIN GLARGINE 100 UNIT/ML ~~LOC~~ SOLN: 15.0000 [IU] | Freq: Every day | SUBCUTANEOUS | Status: DC

## 2010-12-20 MED ORDER — ONDANSETRON HCL 4 MG/2ML IJ SOLN: 4.0000 mg | Freq: Four times a day (QID) | INTRAMUSCULAR | Status: DC | PRN

## 2010-12-20 MED ORDER — PROMETHAZINE HCL 25 MG/ML IJ SOLN: 6.2500 mg | Freq: Three times a day (TID) | INTRAMUSCULAR | Status: DC | PRN

## 2010-12-20 MED ORDER — SODIUM CHLORIDE 0.9 % IJ SOLN: 3.0000 mL | Freq: Two times a day (BID) | INTRAMUSCULAR | Status: DC

## 2010-12-20 MED ORDER — CITALOPRAM HYDROBROMIDE 20 MG PO TABS
20.0000 mg | ORAL_TABLET | Freq: Every day | ORAL | Status: DC
  Filled 2010-12-20 (×2): qty 1

## 2010-12-20 MED ORDER — HYDRALAZINE HCL 20 MG/ML IJ SOLN
20.0000 mg | Freq: Four times a day (QID) | INTRAMUSCULAR | Status: DC | PRN
  Filled 2010-12-20: qty 1

## 2010-12-20 MED ORDER — SIMVASTATIN 20 MG PO TABS
20.0000 mg | ORAL_TABLET | Freq: Every day | ORAL | Status: DC
  Filled 2010-12-20 (×2): qty 1

## 2010-12-20 MED ORDER — SODIUM CHLORIDE 0.9 % IJ SOLN: 10.0000 mL | Freq: Two times a day (BID) | INTRAMUSCULAR | Status: DC

## 2010-12-20 MED ORDER — CARVEDILOL 12.5 MG PO TABS
12.5000 mg | ORAL_TABLET | Freq: Two times a day (BID) | ORAL | Status: DC
  Filled 2010-12-20 (×5): qty 1

## 2010-12-21 LAB — TYPE AND SCREEN
ABO/RH(D): O POS
Unit division: 0

## 2010-12-22 ENCOUNTER — Encounter: Payer: Self-pay | Admitting: Thoracic Surgery

## 2010-12-24 ENCOUNTER — Other Ambulatory Visit: Payer: Self-pay | Admitting: Thoracic Surgery

## 2010-12-24 DIAGNOSIS — C3410 Malignant neoplasm of upper lobe, unspecified bronchus or lung: Secondary | ICD-10-CM

## 2010-12-26 ENCOUNTER — Encounter: Payer: Self-pay | Admitting: *Deleted

## 2010-12-30 ENCOUNTER — Ambulatory Visit
Admission: RE | Admit: 2010-12-30 | Discharge: 2010-12-30 | Disposition: A | Discharge status: Home / Self Care | Payer: PRIVATE HEALTH INSURANCE | Source: Ambulatory Visit | Attending: Thoracic Surgery | Admitting: Thoracic Surgery

## 2010-12-30 ENCOUNTER — Ambulatory Visit (INDEPENDENT_AMBULATORY_CARE_PROVIDER_SITE_OTHER): Payer: Self-pay | Admitting: Thoracic Surgery

## 2010-12-30 ENCOUNTER — Telehealth: Payer: Self-pay | Admitting: *Deleted

## 2010-12-30 VITALS — BP 171/87 | HR 83 | Resp 18

## 2010-12-30 DIAGNOSIS — C3490 Malignant neoplasm of unspecified part of unspecified bronchus or lung: Secondary | ICD-10-CM | POA: Insufficient documentation

## 2010-12-30 DIAGNOSIS — I10 Essential (primary) hypertension: Secondary | ICD-10-CM

## 2010-12-30 DIAGNOSIS — C3410 Malignant neoplasm of upper lobe, unspecified bronchus or lung: Secondary | ICD-10-CM

## 2010-12-30 MED ORDER — CARVEDILOL 12.5 MG PO TABS: 12.5000 mg | ORAL_TABLET | Freq: Two times a day (BID) | ORAL | Status: DC

## 2010-12-30 NOTE — Telephone Encounter (Signed)
Pt informed

## 2010-12-30 NOTE — Telephone Encounter (Signed)
D/C summary and Dr B note indicated Coreg 12.5. I could find no mention of a 3.125 mg dose. And since her BP is elevated, she certainly doesn't need the lower dose. I sent in refill fro the 12.5 BID dose. HR was 83 so can tolerate teh BB. Thanks

## 2010-12-30 NOTE — Progress Notes (Signed)
HPI patient is returns for her first postoperative visit. Incisions are well-healed. We removed the chest tube sutures. Chest x-ray shows further improvement on the right. She had a stage Ib non-small cell lung cancer. We will refer her to Dr. Gwenyth Bouillon. We gave her a refill for oxycodone and immediate release #60/5 mg. We told her to see her medical doctor to adjust her blood pressure medications. We plan to go back on all her blood pressure meds.   Current Outpatient Prescriptions  Medication Sig Dispense Refill  . acetaminophen (TYLENOL) 325 MG tablet Take 650 mg by mouth every 6 (six) hours as needed.        Marland Kitchen aspirin 81 MG tablet Take 81 mg by mouth daily.        . carvedilol (COREG) 12.5 MG tablet Take 1 tablet (12.5 mg total) by mouth 2 (two) times daily with a meal.  60 tablet  3  . citalopram (CELEXA) 20 MG tablet Take 1 tablet (20 mg total) by mouth daily.  30 tablet  3  . docusate sodium (COLACE) 100 MG capsule Take 100 mg by mouth daily.        . insulin glargine (LANTUS SOLOSTAR) 100 UNIT/ML injection Inject 40 Units into the skin at bedtime.       Marland Kitchen lisinopril (PRINIVIL,ZESTRIL) 10 MG tablet Take 10 mg by mouth daily.        Marland Kitchen ofloxacin (OCUFLOX) 0.3 % ophthalmic solution Place 1 drop into both eyes 4 (four) times daily.        Marland Kitchen oxycodone (OXY-IR) 5 MG capsule Take 5 mg by mouth every 4 (four) hours as needed. 1 or 2 q4-6hrs prn       . simvastatin (ZOCOR) 20 MG tablet Take 20 mg by mouth at bedtime.        Marland Kitchen oxyCODONE-acetaminophen (PERCOCET) 7.5-500 MG per tablet Take 1 tablet by mouth 3 (three) times daily as needed for pain.  90 tablet  0     Review of Systems: Unchanged incisions were well healed lungs were clear to auscultation percussion heart was regular sinus rhythm   Physical Exam   Diagnostic Tests: Chest x-ray showed improvement in aeration on right   Impression: Stage IB non-small cell lung cancer adenocarcinoma right upper lobe status post resection of right  upper lobe   Plan: Return in 3 weeks with chest x-ray

## 2010-12-30 NOTE — Telephone Encounter (Signed)
Call from pt's daughter for clarification of dose of coreg. Pt d/c from hospital on 11/1 and was d/c coreg 12.5 mg. Pt seen at Dr Remo Lipps office today and BP 176/80 and he told her to go back to original Rx of coreg 3.125 mg Pt has appointment on 11/19, which med should she take.  Pharmacy would not give Rx for coreg 12.5 mg, it needs a MD approval.

## 2010-12-31 ENCOUNTER — Telehealth: Payer: Self-pay | Admitting: *Deleted

## 2010-12-31 NOTE — Telephone Encounter (Signed)
Talked with pt daughter regarding appt with Dr. Arbutus Ped 01/07/11 at 10:00.  She verbalized understanding.

## 2011-01-01 ENCOUNTER — Ambulatory Visit: Payer: PRIVATE HEALTH INSURANCE | Admitting: Cardiology

## 2011-01-02 NOTE — Progress Notes (Signed)
Addended by: Dorie Rank E on: 01/02/2011 01:59 PM   Modules accepted: Orders

## 2011-01-05 ENCOUNTER — Telehealth: Payer: Self-pay | Admitting: *Deleted

## 2011-01-05 ENCOUNTER — Encounter: Payer: PRIVATE HEALTH INSURANCE | Admitting: Internal Medicine

## 2011-01-05 NOTE — Telephone Encounter (Signed)
Tried to call again no answer and unable to leave a message.

## 2011-01-05 NOTE — Telephone Encounter (Signed)
Called pt regarding appt 11/21 unable to leave message at home number or cell.

## 2011-01-06 ENCOUNTER — Telehealth: Payer: Self-pay | Admitting: *Deleted

## 2011-01-06 NOTE — Telephone Encounter (Signed)
Called pt daughter regarding appt 11/21 with Dr. Arbutus Ped. She verbalized understanding of appt.

## 2011-01-07 ENCOUNTER — Other Ambulatory Visit (HOSPITAL_BASED_OUTPATIENT_CLINIC_OR_DEPARTMENT_OTHER): Payer: PRIVATE HEALTH INSURANCE | Admitting: Lab

## 2011-01-07 ENCOUNTER — Ambulatory Visit (HOSPITAL_BASED_OUTPATIENT_CLINIC_OR_DEPARTMENT_OTHER): Payer: PRIVATE HEALTH INSURANCE | Admitting: Internal Medicine

## 2011-01-07 VITALS — BP 160/84 | HR 78 | Temp 97.2°F | Ht 63.5 in | Wt 171.8 lb

## 2011-01-07 DIAGNOSIS — C3410 Malignant neoplasm of upper lobe, unspecified bronchus or lung: Secondary | ICD-10-CM

## 2011-01-07 DIAGNOSIS — C3490 Malignant neoplasm of unspecified part of unspecified bronchus or lung: Secondary | ICD-10-CM

## 2011-01-07 LAB — COMPREHENSIVE METABOLIC PANEL
ALT: <8 U/L (ref 0–35)
AST: 11 U/L (ref 0–37)
Albumin: 3.5 g/dL (ref 3.5–5.2)
Alkaline Phosphatase: 53 U/L (ref 39–117)
BUN: 16 mg/dL (ref 6–23)
CO2: 28 mEq/L (ref 19–32)
Calcium: 8.8 mg/dL (ref 8.4–10.5)
Chloride: 106 mEq/L (ref 96–112)
Creatinine, Ser: 0.63 mg/dL (ref 0.50–1.10)
Glucose, Bld: 107 mg/dL — ABNORMAL HIGH (ref 70–99)
Potassium: 4.1 mEq/L (ref 3.5–5.3)
Sodium: 141 mEq/L (ref 135–145)
Total Bilirubin: 0.3 mg/dL (ref 0.3–1.2)
Total Protein: 5.9 g/dL — ABNORMAL LOW (ref 6.0–8.3)

## 2011-01-07 LAB — CBC WITH DIFFERENTIAL/PLATELET
Basophils Absolute: 0 10*3/uL (ref 0.0–0.1)
Eosinophils Absolute: 0.1 10*3/uL (ref 0.0–0.5)
HCT: 32.3 % — ABNORMAL LOW (ref 34.8–46.6)
LYMPH%: 33.7 % (ref 14.0–49.7)
MCV: 81.1 fL (ref 79.5–101.0)
MONO#: 0.4 10*3/uL (ref 0.1–0.9)
MONO%: 9.8 % (ref 0.0–14.0)
NEUT#: 2.4 10*3/uL (ref 1.5–6.5)
NEUT%: 52.3 % (ref 38.4–76.8)
Platelets: 296 10*3/uL (ref 145–400)
RBC: 3.98 10*6/uL (ref 3.70–5.45)
WBC: 4.5 10*3/uL (ref 3.9–10.3)

## 2011-01-07 NOTE — Progress Notes (Signed)
REASON FOR CONSULTATION:  77 years old [place] American female diagnosed with lung cancer  HPI BISMA KLETT is a 77 y.o. female was past medical history significant for diabetes mellitus hypertension GERD congestive heart failure, CVA X2 and dyslipidemia. The patient also has a remote history of smoking quit 10 years ago. She mentions that in September of 2009, she had that B. of breath chest exudate for her excision of a left shoulder lipoma and it showed right upper lobe 1.8 x 0.4 CM lesion. On 12/12/2007 the patient had CT scan of the chest without contrast performed which showed mixed solid and groundglass mass in the right upper lobe most consistent with primary bronchogenic carcinoma. A PET scan on 12/22/2007 showed loaded and indicates activity in the right upper lobe opacity at 2.6 g/ML maximum SUV. On 12/29/2007 the patient underwent a video bronchoscopy under the care of Dr. Edwyna Shell, but it was not conclusive for malignancy. She was followed by observation and repeat scan at a regular basis. Repeat CT scan of the chest on 11/12/2010 showed Increasing size and density of a spiculated right upper lobe  mass.  As previously described this is suspicious for primary  bronchogenic carcinoma, likely a low grade adenocarcinoma. MRI of the brain on 11/13/2010 showed atrophy and chronic skin changes no acute infarct and a right frontal lobe meningioma was unchanged. A PET scan on 11/18/2010 showed interval increase in size and metabolic activity of right  upper lobe mass over several years is concerning for a low grade   malignancy such as bronchioalveolar carcinoma.No evidence of mediastinal or distant metastasis. Dr. Edwyna Shell ordered a CT-guided biopsy of the right upper lobe lung mass which was performed on 11/20/1998 with and it showed atypical cells. On 12/15/2010 the patient underwent a right VATS, a right lower lobectomy was node dissection under the care of Dr. Edwyna Shell. The final pathology case number  [id number] showed invasive moderately to poorly differentiated adenocarcinoma, mixed type, measuring 3.8 CM, no lymphovascular invasion adenocarcinoma was confined to the lung parenchyma without a pleural involvement the resection margins were -2 benign lymph nodes were identified with no tumor seen. The other dissected lymph nodes from 11R, 10R, 9R, 7 and 4R were negative for malignancy. The tissue block was sent for EGFR mutation as well as ALK gene translocation. The patient has EGFR alteration detected in L858R but negative for ALK gene translocation. Dr. Edwyna Shell kindly referred the patient to me today for evaluation and recommendation regarding adjuvant chemotherapy.  @SFHPI @  Past Medical History  Diagnosis Date  . Diabetes mellitus 2007    HgA1C (02/20/2010) = 9.2, HgA1C (03/20/2009) = 12.1  . Hyperlipidemia   . Hypertension   . GERD (gastroesophageal reflux disease)   . CVA (cerebrovascular accident) 2006     right embolic stroke, no residual deficits  . Diverticulitis   . CVA (cerebral infarction) 7-yrs ago  . Arthritis   . Dysphagia   . CHF (congestive heart failure)   . DIABETES MELLITUS, TYPE II 12/04/2005  . HYPERLIPIDEMIA 12/04/2005  . HYPERTENSION 12/04/2005  . GERD 12/04/2005  . ARTHRITIS, KNEE 03/24/2006  . Lung mass 07/08/2010  . Meningioma 07/21/2010  . Hemangioma of liver 12/02/2010  . Depression 12/02/2010  . Adenocarcinoma of lung      Right upper lobe adenocarcinoma.     Past Surgical History  Procedure Date  . Abdominal hysterectomy   . Video bronchoscope. 12/29/2007    [place]  . Wide excision of left upper back mass.   Marland Kitchen  Extracapsular cataract extraction with intraocular     lens implantation.  . Right vats,right thoracotomy,right lower lobectomy with node dissection     Family History  Problem Relation Age of Onset  . Hyperlipidemia Brother   . Hypertension Brother   . Diabetes Brother     Social History History  Substance Use Topics  .  Smoking status: Former Smoker    Types: Cigarettes    Quit date: 02/17/2000  . Smokeless tobacco: Never Used  . Alcohol Use: No    No Known Allergies  Current Outpatient Prescriptions  Medication Sig Dispense Refill  . acetaminophen (TYLENOL) 325 MG tablet Take 650 mg by mouth every 6 (six) hours as needed.        Marland Kitchen aspirin 81 MG tablet Take 81 mg by mouth daily.        . carvedilol (COREG) 12.5 MG tablet Take 1 tablet (12.5 mg total) by mouth 2 (two) times daily with a meal.  60 tablet  3  . citalopram (CELEXA) 20 MG tablet Take 1 tablet (20 mg total) by mouth daily.  30 tablet  3  . docusate sodium (COLACE) 100 MG capsule Take 100 mg by mouth daily.        . insulin glargine (LANTUS SOLOSTAR) 100 UNIT/ML injection Inject 40 Units into the skin at bedtime.       Marland Kitchen lisinopril (PRINIVIL,ZESTRIL) 10 MG tablet Take 10 mg by mouth daily.        Marland Kitchen ofloxacin (OCUFLOX) 0.3 % ophthalmic solution Place 1 drop into both eyes 4 (four) times daily.        Marland Kitchen oxycodone (OXY-IR) 5 MG capsule Take 5 mg by mouth every 4 (four) hours as needed. 1 or 2 q4-6hrs prn       . oxyCODONE-acetaminophen (PERCOCET) 7.5-500 MG per tablet Take 1 tablet by mouth 3 (three) times daily as needed for pain.  90 tablet  0  . simvastatin (ZOCOR) 20 MG tablet Take 20 mg by mouth at bedtime.          Review of Systems  A comprehensive review of systems was negative except for: Constitutional: positive for fatigue Respiratory: positive for dyspnea on exertion and pleurisy/chest pain  Physical Exam  AVW:UJWJX, healthy and no distress SKIN: no rashes or significant lesions HEAD: Normocephalic EYES: normal EARS: External ears normal OROPHARYNX:no exudate and no erythema  NECK: supple, no adenopathy LYMPH:  no palpable lymphadenopathy BREAST:not examined LUNGS: clear to auscultation , clear to auscultation and percussion HEART: regular rate & rhythm, no murmurs and no gallops ABDOMEN:abdomen soft, non-tender and  normal bowel sounds BACK: Back symmetric, no curvature. EXTREMITIES:no joint deformities, effusion, or inflammation, no edema, no skin discoloration  NEURO: alert & oriented x 3 with fluent speech, no focal motor/sensory deficits, gait normal    Studies/Results: Dg Chest 2 View  12/30/2010  *RADIOLOGY REPORT*  Clinical Data: Malignant neoplasm, recent lung surgery  CHEST - 2 VIEW  Comparison: December 20, 2010  Findings: Cardiomediastinal silhouette appears normal.  Left lung is clear.  No pneumothorax is seen.  No significant change is seen involving the right suprahilar opacity compared to prior exam. Small amount of fluid or pleural thickening is seen around the right lung apex.  IMPRESSION: Small amount of pleural fluid or thickening seen in right apical region. Stable right suprahilar mass or density.  No pneumothorax is seen.  Original Report Authenticated By: Venita Sheffield., M.D.    Assessment: This is a very pleasant  77 years old [place] American female diagnosed with a stage IB (T2a, N0, M0) non-small cell lung cancer, adenocarcinoma with positive EGFR mutation and negative ALK gene translocation. The patient is status post right lower lobectomy with lymph node dissection. She is recovering well from her surgery with no significant complaints. I have a lengthy discussion with the patient and her family today about her disease stage, prognosis and treatment options. I explained to the patient that it is no survival benefit for adjuvant chemotherapy for patient with stage IB with tumor size less than 4.0 CM. The current standard of care is observation.  Plan: I would see the patient back for followup visit in 6 months with repeat CT scan of the chest. The patient and her family agreed to the current plan. If she has any evidence for disease recurrence in the future I would consider the patient for treatment with oral Tarceva.  All questions were answered. The patient knows to call the  clinic with any problems, questions or concerns. We can certainly see the patient much sooner if necessary.  Thank you so much for allowing me to participate in the care of [name]. I will continue to follow up the patient with you and assist in her care.  I spent 25 minutes counseling the patient face to face. The total time spent in the appointment was 55 minutes.   Jameir Ake K. 01/07/2011, 11:09 AM

## 2011-01-13 ENCOUNTER — Other Ambulatory Visit: Payer: Self-pay | Admitting: Internal Medicine

## 2011-01-13 ENCOUNTER — Telehealth: Payer: Self-pay | Admitting: Internal Medicine

## 2011-01-13 NOTE — Telephone Encounter (Signed)
Talked to pt, gave her appt in May for lab and CT, then a few days later see MD.

## 2011-01-14 ENCOUNTER — Other Ambulatory Visit: Payer: Self-pay | Admitting: Thoracic Surgery

## 2011-01-14 DIAGNOSIS — C3410 Malignant neoplasm of upper lobe, unspecified bronchus or lung: Secondary | ICD-10-CM

## 2011-01-20 ENCOUNTER — Ambulatory Visit: Payer: Self-pay | Admitting: Thoracic Surgery

## 2011-01-21 ENCOUNTER — Other Ambulatory Visit: Payer: Self-pay | Admitting: Thoracic Surgery

## 2011-01-21 DIAGNOSIS — C3410 Malignant neoplasm of upper lobe, unspecified bronchus or lung: Secondary | ICD-10-CM

## 2011-01-27 ENCOUNTER — Ambulatory Visit: Payer: Self-pay | Admitting: Thoracic Surgery

## 2011-01-27 ENCOUNTER — Ambulatory Visit
Admission: RE | Admit: 2011-01-27 | Discharge: 2011-01-27 | Disposition: A | Discharge status: Home / Self Care | Payer: PRIVATE HEALTH INSURANCE | Source: Ambulatory Visit | Attending: Thoracic Surgery | Admitting: Thoracic Surgery

## 2011-01-27 DIAGNOSIS — C3410 Malignant neoplasm of upper lobe, unspecified bronchus or lung: Secondary | ICD-10-CM

## 2011-01-29 ENCOUNTER — Other Ambulatory Visit: Payer: Self-pay | Admitting: Internal Medicine

## 2011-02-03 ENCOUNTER — Other Ambulatory Visit: Payer: Self-pay | Admitting: Thoracic Surgery

## 2011-02-03 DIAGNOSIS — C3410 Malignant neoplasm of upper lobe, unspecified bronchus or lung: Secondary | ICD-10-CM

## 2011-02-04 ENCOUNTER — Telehealth: Payer: Self-pay | Admitting: *Deleted

## 2011-02-04 ENCOUNTER — Ambulatory Visit (INDEPENDENT_AMBULATORY_CARE_PROVIDER_SITE_OTHER): Payer: Self-pay | Admitting: Thoracic Surgery

## 2011-02-04 ENCOUNTER — Encounter: Payer: Self-pay | Admitting: Thoracic Surgery

## 2011-02-04 VITALS — BP 142/86 | HR 96 | Resp 20 | Ht 64.0 in | Wt 170.0 lb

## 2011-02-04 DIAGNOSIS — Z9889 Other specified postprocedural states: Secondary | ICD-10-CM

## 2011-02-04 DIAGNOSIS — Z902 Acquired absence of lung [part of]: Secondary | ICD-10-CM

## 2011-02-04 DIAGNOSIS — C3410 Malignant neoplasm of upper lobe, unspecified bronchus or lung: Secondary | ICD-10-CM

## 2011-02-04 NOTE — Telephone Encounter (Signed)
Call from pt's daughter to ask about getting a refill on the pt's Pravachol.  Pt daughter was informed that the pt's pravochol had been discontinued and now pt should be on Zocor/Simvastatin.  Daughter said that pt was not on Zocor/Simvastattion.  Daughter said that she had not been informed about the change.  Later said that mother was taking Simvastation since her discharge in October.  When asked daughter said that pt has been taking both the Simvastatin and the Pravachol.  Pt's daughter was told to stop the Pravachol.  Also informed that the Simvastatin and the Pravachol are both for Cholesterol.  Daughter was informed that if labs or anything else needed to be done that the Clinics will give her a call.  Angelina Ok, RN 02/04/2011 10:48 AM

## 2011-02-04 NOTE — Progress Notes (Signed)
HPI patient returns today. Her incisions are well-healed. Chest x-ray shows normal postoperative changes. She'll followup with the oncologist and may. We gave her a refill for Percocet 7.5 mg/325 #40. This will see her again in 6 weeks with chest x-ray   Current Outpatient Prescriptions  Medication Sig Dispense Refill  . acetaminophen (TYLENOL) 325 MG tablet Take 650 mg by mouth every 6 (six) hours as needed.        Marland Kitchen aspirin 81 MG tablet Take 81 mg by mouth daily.        . carvedilol (COREG) 12.5 MG tablet Take 1 tablet (12.5 mg total) by mouth 2 (two) times daily with a meal.  60 tablet  3  . citalopram (CELEXA) 20 MG tablet Take 1 tablet (20 mg total) by mouth daily.  30 tablet  3  . docusate sodium (COLACE) 100 MG capsule Take 100 mg by mouth daily.        . insulin glargine (LANTUS SOLOSTAR) 100 UNIT/ML injection Inject 40 Units into the skin at bedtime.       Marland Kitchen lisinopril (PRINIVIL,ZESTRIL) 10 MG tablet Take 10 mg by mouth daily.        Marland Kitchen ofloxacin (OCUFLOX) 0.3 % ophthalmic solution Place 1 drop into both eyes 4 (four) times daily.        Marland Kitchen oxyCODONE-acetaminophen (PERCOCET) 7.5-500 MG per tablet Take 1 tablet by mouth 3 (three) times daily as needed for pain.  90 tablet  0  . simvastatin (ZOCOR) 20 MG tablet Take 20 mg by mouth at bedtime.           Review of Systems: No changePhysical Exam lungs are clear to auscultation percussion   Diagnostic Tests: Chest x-ray shows normal postoperative changes on the right   Impression status post right upper lobectomy for non-small cell lung cancer:   Plan: Return in 6 weeks with chest x-ray

## 2011-02-04 NOTE — Telephone Encounter (Signed)
Patient should continue simvastatin and stop pravastatin.

## 2011-02-23 ENCOUNTER — Ambulatory Visit (INDEPENDENT_AMBULATORY_CARE_PROVIDER_SITE_OTHER): Payer: PRIVATE HEALTH INSURANCE | Admitting: Internal Medicine

## 2011-02-23 ENCOUNTER — Encounter: Payer: Self-pay | Admitting: Internal Medicine

## 2011-02-23 VITALS — BP 134/81 | HR 79 | Temp 97.2°F | Ht 64.0 in | Wt 168.3 lb

## 2011-02-23 DIAGNOSIS — I6350 Cerebral infarction due to unspecified occlusion or stenosis of unspecified cerebral artery: Secondary | ICD-10-CM

## 2011-02-23 DIAGNOSIS — I10 Essential (primary) hypertension: Secondary | ICD-10-CM

## 2011-02-23 DIAGNOSIS — M25511 Pain in right shoulder: Secondary | ICD-10-CM

## 2011-02-23 DIAGNOSIS — C3490 Malignant neoplasm of unspecified part of unspecified bronchus or lung: Secondary | ICD-10-CM | POA: Insufficient documentation

## 2011-02-23 DIAGNOSIS — E119 Type 2 diabetes mellitus without complications: Secondary | ICD-10-CM

## 2011-02-23 DIAGNOSIS — I639 Cerebral infarction, unspecified: Secondary | ICD-10-CM

## 2011-02-23 DIAGNOSIS — M25519 Pain in unspecified shoulder: Secondary | ICD-10-CM

## 2011-02-23 DIAGNOSIS — E785 Hyperlipidemia, unspecified: Secondary | ICD-10-CM

## 2011-02-23 DIAGNOSIS — Z299 Encounter for prophylactic measures, unspecified: Secondary | ICD-10-CM

## 2011-02-23 LAB — GLUCOSE, CAPILLARY: Glucose-Capillary: 110 mg/dL — ABNORMAL HIGH (ref 70–99)

## 2011-02-23 LAB — POCT GLYCOSYLATED HEMOGLOBIN (HGB A1C): Hemoglobin A1C: 5.9

## 2011-02-23 MED ORDER — GABAPENTIN 300 MG PO CAPS: 300.0000 mg | ORAL_CAPSULE | Freq: Three times a day (TID) | ORAL | Status: DC

## 2011-02-23 MED ORDER — INSULIN GLARGINE 100 UNIT/ML ~~LOC~~ SOLN: 30.0000 [IU] | Freq: Every day | SUBCUTANEOUS | Status: DC

## 2011-02-23 MED ORDER — OXYCODONE-ACETAMINOPHEN 7.5-500 MG PO TABS: 1.0000 | ORAL_TABLET | Freq: Three times a day (TID) | ORAL | Status: DC | PRN

## 2011-02-23 NOTE — Assessment & Plan Note (Signed)
Patient seems to have some neuropathy in the incision site status post right lower lobectomy. Gabapentin started. Consider to increase dosage if needed. Patient will followup with oncology in May of 2013

## 2011-02-23 NOTE — Assessment & Plan Note (Addendum)
Hemoglobin A1c today is 5.9. Considering her age and core morbidities I'm not attempting very tight control of patient's diabetes. I would decrease Lantus from 40 units to 20 units. Our most likely decrease it even further to her next office visit.

## 2011-02-23 NOTE — Assessment & Plan Note (Signed)
Blood pressure well-controlled. Continue current regimen. BP Readings from Last 3 Encounters:  02/23/11 134/81  02/04/11 142/86  01/07/11 160/84

## 2011-02-23 NOTE — Progress Notes (Signed)
Subjective:   Patient ID: [name] female   DOB: December 14, 1933 78 y.o.   MRN: [id number]  HPI: [name] is a 78 y.o. female with past history significant as outlined below who presented to the clinic for rectal office visit. He think your patient reports that she is doing fine except had some difficulty swallowing.  lung cancer: Patient is coming to followup  by Dr. Cyndra Numbers s/p  right upper lung resection October 2012. Noted that she has some discomfort at the incision site. Patient is also followed by Dr.Mohamed.      Past Medical History  Diagnosis Date  . Diabetes mellitus 2007    HgA1C (02/20/2010) = 9.2, HgA1C (03/20/2009) = 12.1  . Hyperlipidemia   . Hypertension   . GERD (gastroesophageal reflux disease)   . CVA (cerebrovascular accident) 2006     right embolic stroke, no residual deficits  . Diverticulitis   . CVA (cerebral infarction) 7-yrs ago  . Arthritis   . Dysphagia   . CHF (congestive heart failure)   . DIABETES MELLITUS, TYPE II 12/04/2005  . HYPERLIPIDEMIA 12/04/2005  . HYPERTENSION 12/04/2005  . GERD 12/04/2005  . ARTHRITIS, KNEE 03/24/2006  . Lung mass 07/08/2010  . Meningioma 07/21/2010  . Hemangioma of liver 12/02/2010  . Depression 12/02/2010  . Adenocarcinoma of lung      Right upper lobe adenocarcinoma.    Current Outpatient Prescriptions  Medication Sig Dispense Refill  . acetaminophen (TYLENOL) 325 MG tablet Take 650 mg by mouth every 6 (six) hours as needed.        Marland Kitchen aspirin 81 MG tablet Take 81 mg by mouth daily.        . carvedilol (COREG) 12.5 MG tablet Take 1 tablet (12.5 mg total) by mouth 2 (two) times daily with a meal.  60 tablet  3  . citalopram (CELEXA) 20 MG tablet Take 1 tablet (20 mg total) by mouth daily.  30 tablet  3  . docusate sodium (COLACE) 100 MG capsule Take 100 mg by mouth daily.        . insulin glargine (LANTUS SOLOSTAR) 100 UNIT/ML injection Inject 40 Units into the skin at bedtime.       Marland Kitchen lisinopril  (PRINIVIL,ZESTRIL) 10 MG tablet Take 10 mg by mouth daily.        Marland Kitchen ofloxacin (OCUFLOX) 0.3 % ophthalmic solution Place 1 drop into both eyes 4 (four) times daily.        Marland Kitchen oxyCODONE-acetaminophen (PERCOCET) 7.5-500 MG per tablet Take 1 tablet by mouth 3 (three) times daily as needed for pain.  90 tablet  0  . simvastatin (ZOCOR) 20 MG tablet Take 20 mg by mouth at bedtime.         Review of Systems: Constitutional: Denies fever, chills, diaphoresis, appetite change and fatigue.  Respiratory: Denies SOB, DOE, cough, chest tightness,  and wheezing.   Cardiovascular: Denies chest pain, palpitations and leg swelling.  Gastrointestinal: Denies nausea, vomiting, abdominal pain, diarrhea, constipation, blood in stool and abdominal distention.  Genitourinary: Denies dysuria, urgency, frequency, hematuria, flank pain and difficulty urinating.  Skin: Denies pallor, rash and wound.    Objective:  Physical Exam: Filed Vitals:   02/23/11 0920  BP: 153/82  Pulse: 85  Temp: 97.2 F (36.2 C)  TempSrc: Oral  Height: 5\' 4"  (1.626 m)  Weight: 168 lb 4.8 oz (76.34 kg)   Constitutional: Vital signs reviewed.  Patient is a well-developed and well-nourished woman in no acute distress and  cooperative with exam. Alert and oriented x3.  Neck: Supple,  Cardiovascular: RRR, S1 normal, S2 normal, no MRG, pulses symmetric and intact bilaterally Pulmonary/Chest: CTAB, no wheezes, rales, or rhonchi Abdominal: Soft. Non-tender, non-distended, bowel sounds are normal, no masses, organomegaly, or guarding present.  GU: no CVA tenderness Neurological: A&O x3,  no focal motor deficit, sensory intact to light touch bilaterally.  Skin: Warm, dry and intact. No rash, cyanosis, or clubbing.

## 2011-02-24 DIAGNOSIS — Z0000 Encounter for general adult medical examination without abnormal findings: Secondary | ICD-10-CM | POA: Insufficient documentation

## 2011-02-24 MED ORDER — INSULIN GLARGINE 100 UNIT/ML ~~LOC~~ SOLN: 20.0000 [IU] | Freq: Every day | SUBCUTANEOUS | Status: DC

## 2011-02-24 NOTE — Progress Notes (Signed)
Addended by: Almyra Deforest on: 02/24/2011 01:46 PM   Modules accepted: Orders

## 2011-02-25 ENCOUNTER — Encounter: Payer: Self-pay | Admitting: Internal Medicine

## 2011-03-12 ENCOUNTER — Other Ambulatory Visit: Payer: Self-pay | Admitting: Thoracic Surgery

## 2011-03-12 DIAGNOSIS — C3410 Malignant neoplasm of upper lobe, unspecified bronchus or lung: Secondary | ICD-10-CM

## 2011-03-13 ENCOUNTER — Ambulatory Visit: Payer: Self-pay | Admitting: Thoracic Surgery

## 2011-03-16 ENCOUNTER — Other Ambulatory Visit: Payer: Self-pay | Admitting: Thoracic Surgery

## 2011-03-16 DIAGNOSIS — C3410 Malignant neoplasm of upper lobe, unspecified bronchus or lung: Secondary | ICD-10-CM

## 2011-03-17 ENCOUNTER — Ambulatory Visit: Payer: Self-pay | Admitting: Thoracic Surgery

## 2011-03-18 ENCOUNTER — Ambulatory Visit: Payer: PRIVATE HEALTH INSURANCE | Admitting: Thoracic Surgery

## 2011-03-18 ENCOUNTER — Telehealth: Payer: Self-pay | Admitting: *Deleted

## 2011-03-18 NOTE — Telephone Encounter (Signed)
Call from [facility] with with [facility] asking for Tub seat with back, and home bed with gel overlay and trapeze bar. Order can be verbal.  Physical Therapy is seeing pt per our referral.  Hanna[facility] # [phone] ex [phone]

## 2011-03-19 NOTE — Telephone Encounter (Signed)
Called [facility] and gave verbal order for Tube seat with back, Trapeze bar and home bed with gel overlay. Spoke with [employer].

## 2011-03-23 ENCOUNTER — Encounter: Payer: PRIVATE HEALTH INSURANCE | Admitting: Internal Medicine

## 2011-03-29 ENCOUNTER — Other Ambulatory Visit: Payer: Self-pay | Admitting: Internal Medicine

## 2011-03-29 NOTE — Telephone Encounter (Signed)
Called by Ms. Milligan's daughter because this morning when she woke she noticed a blister on the medial side of the 2nd toe on the left foot.  She states that it has not broken but that it is tender to the touch.  It has not broken or drained and there is no redness around the wound.    She also states that she has been using the gabapentin and that it has not helped her at all.  She has been taking it daily but is still having significant pain.    Plan: 1. Protect the area and stay off it for today. 2. If it does break wash it with warm soapy water and cover with a dressing to keep it clean. 3. Continue the gabapentin as prescribed 4. Follow up in clinic early next week.   This was discussed with the patient's daughter and she is in agreement with the plan. 

## 2011-03-29 NOTE — Telephone Encounter (Deleted)
Called by Ms. Geurin's daughter because this morning when she woke she noticed a blister on the medial side of the 2nd toe on the left foot.  She states that it has not broken but that it is tender to the touch.  It has not broken or drained and there is no redness around the wound.    She also states that she has been using the gabapentin and that it has not helped her at all.  She has been taking it daily but is still having significant pain.    Plan: 1. Protect the area and stay off it for today. 2. If it does break wash it with warm soapy water and cover with a dressing to keep it clean. 3. Continue the gabapentin as prescribed 4. Follow up in clinic early next week.   This was discussed with the patient's daughter and she is in agreement with the plan.

## 2011-03-30 ENCOUNTER — Ambulatory Visit: Payer: PRIVATE HEALTH INSURANCE | Admitting: Internal Medicine

## 2011-03-30 ENCOUNTER — Ambulatory Visit (INDEPENDENT_AMBULATORY_CARE_PROVIDER_SITE_OTHER): Payer: PRIVATE HEALTH INSURANCE | Admitting: Internal Medicine

## 2011-03-30 ENCOUNTER — Encounter: Payer: Self-pay | Admitting: Internal Medicine

## 2011-03-30 VITALS — BP 172/89 | HR 85 | Temp 97.0°F | Ht 64.0 in | Wt 179.0 lb

## 2011-03-30 DIAGNOSIS — M25511 Pain in right shoulder: Secondary | ICD-10-CM

## 2011-03-30 DIAGNOSIS — E785 Hyperlipidemia, unspecified: Secondary | ICD-10-CM

## 2011-03-30 DIAGNOSIS — L089 Local infection of the skin and subcutaneous tissue, unspecified: Secondary | ICD-10-CM

## 2011-03-30 DIAGNOSIS — L97519 Non-pressure chronic ulcer of other part of right foot with unspecified severity: Secondary | ICD-10-CM | POA: Insufficient documentation

## 2011-03-30 DIAGNOSIS — B353 Tinea pedis: Secondary | ICD-10-CM | POA: Insufficient documentation

## 2011-03-30 DIAGNOSIS — I10 Essential (primary) hypertension: Secondary | ICD-10-CM

## 2011-03-30 DIAGNOSIS — S90424A Blister (nonthermal), right lesser toe(s), initial encounter: Secondary | ICD-10-CM

## 2011-03-30 DIAGNOSIS — X58XXXA Exposure to other specified factors, initial encounter: Secondary | ICD-10-CM

## 2011-03-30 DIAGNOSIS — J3489 Other specified disorders of nose and nasal sinuses: Secondary | ICD-10-CM

## 2011-03-30 DIAGNOSIS — E119 Type 2 diabetes mellitus without complications: Secondary | ICD-10-CM

## 2011-03-30 DIAGNOSIS — R0981 Nasal congestion: Secondary | ICD-10-CM | POA: Insufficient documentation

## 2011-03-30 LAB — GLUCOSE, CAPILLARY: Glucose-Capillary: 97 mg/dL (ref 70–99)

## 2011-03-30 MED ORDER — OXYCODONE-ACETAMINOPHEN 7.5-500 MG PO TABS: 1.0000 | ORAL_TABLET | Freq: Three times a day (TID) | ORAL | Status: DC | PRN

## 2011-03-30 MED ORDER — FLUTICASONE PROPIONATE 50 MCG/ACT NA SUSP: 2.0000 | Freq: Every day | NASAL | Status: DC

## 2011-03-30 MED ORDER — CEPHALEXIN 500 MG PO CAPS: 500.0000 mg | ORAL_CAPSULE | Freq: Four times a day (QID) | ORAL | Status: AC

## 2011-03-30 MED ORDER — TERBINAFINE HCL 1 % EX CREA: TOPICAL_CREAM | Freq: Two times a day (BID) | CUTANEOUS | Status: AC

## 2011-03-30 NOTE — Progress Notes (Signed)
Subjective:   Patient ID: [name] female   DOB: January 07, 1934 78 y.o.   MRN: [id number]  HPI: [name] is a 78 y.o. with a PMHx of DMII, HTN, HLD, who presented to clinic today for the following:  1) Right second toe blister - pt has noted a blister on right second toe since yesterday, denies recent change in shoes or tight shoes. Denies redness, warmth of surrounding skin. Denies recent bites, ulcerations, fevers, chills, nausea, vomiting. No prior similar issue. She did cut her toenail the day before symptoms arose.  2) Chronic pain - pt has chronic pain in right shoulder and knees for > 1 year, is on chronic narcotics without violation of pain contract since that time. She denies constipation. Requests refills of chronic pain meds contract for #90/ mo, last filled 01/07.  3) DM, II - Last A1c 5.9 in January 2013 - Patient checking blood sugars 3 times daily, before breakfast, after dinner, and at bedtime. Reports fasting blood sugars of 120s mg/dL. Currently taking Lantus 20 units. 0 hypoglycemic episodes since last visit. denies polyuria, polydipsia, nausea, vomiting, diarrhea.  does not request refills today.  4) Nasal congestion - pt notes nasal congestion for last few days, with mild sore throat developing yesterday. She otherwise is not having fevers, chills, cough, ear pain or discharge. No recent sick contacts.    Review of Systems: Per HPI.   Current Outpatient Medications: Medication Sig  . acetaminophen (TYLENOL) 325 MG tablet Take 650 mg by mouth every 6 (six) hours as needed.    Marland Kitchen aspirin 81 MG tablet Take 81 mg by mouth daily.    . carvedilol (COREG) 12.5 MG tablet Take 1 tablet (12.5 mg total) by mouth 2 (two) times daily with a meal.  . [employer] STOOL SOFTENER 100 MG capsule TAKE 1 CAPSULE BY MOUTH DAILY  . gabapentin (NEURONTIN) 300 MG capsule Take 1 capsule (300 mg total) by mouth 3 (three) times daily.  . insulin glargine (LANTUS SOLOSTAR) 100 UNIT/ML  injection Inject 20 Units into the skin at bedtime.  Marland Kitchen lisinopril (PRINIVIL,ZESTRIL) 10 MG tablet TAKE 1 TABLET BY MOUTH DAILY  . oxyCODONE-acetaminophen (PERCOCET) 7.5-500 MG per tablet Take 1 tablet by mouth 3 (three) times daily as needed for pain.  . simvastatin (ZOCOR) 20 MG tablet TAKE 1 TABLET AT BEDTIME  . citalopram (CELEXA) 20 MG tablet Take 1 tablet (20 mg total) by mouth daily.    Allergies: No Known Allergies  Past Medical History  Diagnosis Date  . Diabetes mellitus 2007    HgA1C (02/20/2010) = 9.2, HgA1C (03/20/2009) = 12.1  . Hyperlipidemia   . Hypertension   . GERD (gastroesophageal reflux disease)   . CVA (cerebrovascular accident) 2006     right embolic stroke, no residual deficits  . Diverticulitis   . CVA (cerebral infarction) 7-yrs ago  . Arthritis   . Dysphagia   . CHF (congestive heart failure)   . DIABETES MELLITUS, TYPE II 12/04/2005  . HYPERLIPIDEMIA 12/04/2005  . HYPERTENSION 12/04/2005  . GERD 12/04/2005  . ARTHRITIS, KNEE 03/24/2006  . Lung mass 07/08/2010  . Meningioma 07/21/2010  . Hemangioma of liver 12/02/2010  . Depression 12/02/2010  . Adenocarcinoma of lung      Right upper lobe adenocarcinoma.     Past Surgical History  Procedure Date  . Abdominal hysterectomy   . Video bronchoscope. 12/29/2007    [place]  . Wide excision of left upper back mass.   . Extracapsular cataract  extraction with intraocular     lens implantation.  . Right vats,right thoracotomy,right lower lobectomy with node dissection      Objective:   Physical Exam: Filed Vitals:   03/30/11 1444  BP: 172/89  Pulse: 85  Temp: 97 F (36.1 C)     General: Vital signs reviewed and noted. Well-developed, well-nourished, in no acute distress; alert, appropriate and cooperative throughout examination.  Head: Normocephalic, atraumatic.  Eyes: conjunctivae/corneas clear. PERRL.  Ears: TM nonerythematous, not bulging, good light reflex bilaterally.  Nose: Mucous  membranes moist, inflammed, nonerythematous. Clear discharge.  Throat: Oropharynx nonerythematous, no exudate appreciated.   Neck: No deformities, masses, or tenderness noted.  Lungs:  Normal respiratory effort. Clear to auscultation BL without crackles or wheezes.  Heart: RRR. S1 and S2 normal without gallop, murmur, or rubs.  Abdomen:  BS normoactive. Soft, Nondistended, non-tender.  No masses or organomegaly.  Extremities: No pretibial edema. Right 2nd toe with large cutaneous blister. Mild warmth, redness over dorsal surface of foot. No obvious ulcerations, abrasions. No ingrown toenails.       Assessment & Plan:  Case and plan of care discussed with Dr. Margarito Liner.

## 2011-03-30 NOTE — Assessment & Plan Note (Signed)
Labs: Lab Results  Component Value Date   HGBA1C 5.9 02/23/2011   HGBA1C 6.7* 11/10/2010   CREATININE 0.63 01/07/2011   CREATININE 0.83 12/02/2010   MICROALBUR 2.25* 07/26/2008   MICRALBCREAT 14.2 07/26/2008   CHOL 213* 11/10/2010   HDL 53 11/10/2010   TRIG 65 11/10/2010    Last eye exam and foot exam:    Component Value Date/Time   HMDIABEYEEXA Normal  12/14/2008   HMDIABFOOTEX Done  12/16/2009     Assessment:     Status:  Not having frequent hypoglycemic episodes, but may not need to have such tight control of blood sugars, given age, comorbidities.   Her Lantus was decreased from 40 units to 20 units during her last PCP visit in 02/2011. Can perhaps await new HgA1c for follow-up.  Disease Control: controlled  Progress toward goals: at goal  Barriers to meeting goals: no barriers identified   Plan: Glucometer log was not reviewed today, as pt did not have glucometer available for review.      continue current medications  reminded to bring blood glucose meter & log to each visit  Will consider adjustment of diabetic regimen (consider to decrease lantus further if indicated) during follow-up when blood sugar log available for review.

## 2011-03-30 NOTE — Assessment & Plan Note (Signed)
Patient has a blister on the 2nd right toe, with mild erythema and warmth over dorsal foot, concerning for possible early cellulitis. This seemed to have been provoked by recent clipping of toenails, perhaps were too close to skin, small laceration.  Tx with 7 day course of Keflex  Advised to keep away from tight shoes, clothing.  Advised to come in sooner if symptoms worsen or new symptoms arise.

## 2011-03-30 NOTE — Assessment & Plan Note (Signed)
Between 3rd/4th and 4th/5th toe of right foot.   Tx with 14 d course of Lamisil AT.

## 2011-03-30 NOTE — Assessment & Plan Note (Signed)
Refill chronic pain medication. #90 (ninty)

## 2011-03-30 NOTE — Assessment & Plan Note (Signed)
Pt having nasal congestion for few days, with mild sore throat.   Tx symptomatically with nasal steroids.

## 2011-03-30 NOTE — Patient Instructions (Signed)
   Please follow-up at the clinic in 1 week, at which time we will reevaluate your toe blister.  There have been changes in your medications -  START keflex 4 times daily for 7 days - antibiotic for your blister and possible skin infection  START lamisil between your 3rd/4th toe and 4th/5th toe on right foot - twice daily x 14 days.  START nasal spray twice daily.  Please follow-up in the clinic sooner if needed.  If you have been started on new medication(s), and you develop symptoms concerning for allergic reaction, including, but not limited to, throat closing, tongue swelling, rash, please stop the medication immediately and call the clinic at [phone], and go to the ER.  If you are diabetic, please bring your meter to your next visit.  If symptoms worsen, or new symptoms arise, please call the clinic or go to the ER.  Please bring all of your medications in a bag to your next visit.

## 2011-03-30 NOTE — Assessment & Plan Note (Signed)
BP Readings from Last 3 Encounters:  03/30/11 172/89  02/23/11 134/81  02/04/11 142/86    Basic Metabolic Panel:    Component Value Date/Time   NA 141 01/07/2011 1008   K 4.1 01/07/2011 1008   CL 106 01/07/2011 1008   CO2 28 01/07/2011 1008   BUN 16 01/07/2011 1008   CREATININE 0.63 01/07/2011 1008   CREATININE 0.83 12/02/2010 1627   GLUCOSE 107* 01/07/2011 1008   CALCIUM 8.8 01/07/2011 1008    Assessment: Status:  acutely in pain, anxiety due to toe swelling, previously well controlled  Disease Control: not controlled  Progress toward goals: deteriorated  Barriers to meeting goals: acute pain, anxiety   Plan:  continue current medications  Will reassess in 1 week when she comes back for her toe recheck.

## 2011-03-31 ENCOUNTER — Encounter: Payer: PRIVATE HEALTH INSURANCE | Admitting: Internal Medicine

## 2011-04-01 ENCOUNTER — Ambulatory Visit: Payer: PRIVATE HEALTH INSURANCE | Admitting: Internal Medicine

## 2011-04-06 ENCOUNTER — Ambulatory Visit (INDEPENDENT_AMBULATORY_CARE_PROVIDER_SITE_OTHER): Payer: PRIVATE HEALTH INSURANCE | Admitting: Internal Medicine

## 2011-04-06 ENCOUNTER — Encounter: Payer: Self-pay | Admitting: Internal Medicine

## 2011-04-06 DIAGNOSIS — X58XXXA Exposure to other specified factors, initial encounter: Secondary | ICD-10-CM

## 2011-04-06 DIAGNOSIS — S90424A Blister (nonthermal), right lesser toe(s), initial encounter: Secondary | ICD-10-CM

## 2011-04-06 DIAGNOSIS — IMO0002 Reserved for concepts with insufficient information to code with codable children: Secondary | ICD-10-CM

## 2011-04-06 DIAGNOSIS — B353 Tinea pedis: Secondary | ICD-10-CM

## 2011-04-06 DIAGNOSIS — E119 Type 2 diabetes mellitus without complications: Secondary | ICD-10-CM

## 2011-04-06 DIAGNOSIS — I10 Essential (primary) hypertension: Secondary | ICD-10-CM

## 2011-04-06 NOTE — Progress Notes (Signed)
Subjective:   Patient ID: [name] female   DOB: 17-Dec-1933 78 y.o.   MRN: [id number]  HPI: [name] is a 78 y.o. with a PMHx of DMII, HTN, HLD, who presented to clinic today for the following:  1) Right second toe blister - pt has noted a blister on right second toe since 03/29/2011 - of unclear cause, thought possibly due to ill-fitting shoes. With concern for developing cellulitis, she was started on Keflex x 7 days, states she has been taking regularly without missing doses. States that the swelling is slightly reduced and pain is improved. She remains without fevers, chills, nausea, vomiting. Redness and swelling of surrounding skin is slightly improved.   2) HTN - Patient does check blood pressure regularly at home. Currently taking lisinopril (Prinivil) and Coreg 12.5 BID. denies headaches, dizziness, lightheadedness, chest pain, shortness of breath.  does not request refills today.  3) DM, II - Last A1c 5.9 in January 2013 - Patient checking blood sugars 3 times daily, before breakfast, after dinner, and at bedtime. Reports fasting blood sugars of 120s mg/dL. Currently taking Lantus 20 units. 0 hypoglycemic episodes since last visit. denies polyuria, polydipsia, nausea, vomiting, diarrhea.  does not request refills today.  4) Nasal congestion - during our last visit, pt noted nasal congestion for last few days, with mild sore throat developing day on 02/10. She was treated symptomatically with flonase, which she finds to be very helpful. She otherwise is not having fevers, chills, cough, ear pain or discharge.  5) Tinea Pedis, right foot - patient was noted to have maceration between toes on right foot last visit, was unable to fill Lamisil AT. Denies fevers, chills, generalized malaise, new foot ulcers or lesions.   Review of Systems: Per HPI.   Current Outpatient Medications: Medication Sig  . acetaminophen (TYLENOL) 325 MG tablet Take 650 mg by mouth every 6 (six)  hours as needed.    Marland Kitchen aspirin 81 MG tablet Take 81 mg by mouth daily.    . carvedilol (COREG) 12.5 MG tablet Take 1 tablet (12.5 mg total) by mouth 2 (two) times daily with a meal.  . [facility] STOOL SOFTENER 100 MG capsule TAKE 1 CAPSULE BY MOUTH DAILY  . gabapentin (NEURONTIN) 300 MG capsule Take 1 capsule (300 mg total) by mouth 3 (three) times daily.  . insulin glargine (LANTUS SOLOSTAR) 100 UNIT/ML injection Inject 20 Units into the skin at bedtime.  Marland Kitchen lisinopril (PRINIVIL,ZESTRIL) 10 MG tablet TAKE 1 TABLET BY MOUTH DAILY  . oxyCODONE-acetaminophen (PERCOCET) 7.5-500 MG per tablet Take 1 tablet by mouth 3 (three) times daily as needed for pain.  . simvastatin (ZOCOR) 20 MG tablet TAKE 1 TABLET AT BEDTIME  . citalopram (CELEXA) 20 MG tablet Take 1 tablet (20 mg total) by mouth daily.   Flonase 50 mcg/act      Allergies: No Known Allergies  Past Medical History  Diagnosis Date  . Diabetes mellitus 2007    HgA1C (02/20/2010) = 9.2, HgA1C (03/20/2009) = 12.1  . Hyperlipidemia   . Hypertension   . GERD (gastroesophageal reflux disease)   . CVA (cerebrovascular accident) 2006     right embolic stroke, no residual deficits  . Diverticulitis   . CVA (cerebral infarction) 7-yrs ago  . Arthritis   . Dysphagia   . CHF (congestive heart failure)   . DIABETES MELLITUS, TYPE II 12/04/2005  . HYPERLIPIDEMIA 12/04/2005  . HYPERTENSION 12/04/2005  . GERD 12/04/2005  . ARTHRITIS, KNEE 03/24/2006  .  Lung mass 07/08/2010  . Meningioma 07/21/2010  . Hemangioma of liver 12/02/2010  . Depression 12/02/2010  . Adenocarcinoma of lung      Right upper lobe adenocarcinoma.     Past Surgical History  Procedure Date  . Abdominal hysterectomy   . Video bronchoscope. 12/29/2007    [place]  . Wide excision of left upper back mass.   . Extracapsular cataract extraction with intraocular     lens implantation.  . Right vats,right thoracotomy,right lower lobectomy with node dissection      Objective:    Physical Exam:  BP 131/69  Pulse 84  Temp(Src) 97.8 F (36.6 C) (Oral)  Wt 182 lb 8 oz (82.781 kg)   General: Vital signs reviewed and noted. Well-developed, well-nourished, in no acute distress; alert, appropriate and cooperative throughout examination.  Head: Normocephalic, atraumatic.  Eyes: conjunctivae/corneas clear. PERRL.  Ears: TM nonerythematous, not bulging, good light reflex bilaterally.  Nose: Mucous membranes moist, inflammed, nonerythematous. Clear discharge.  Throat: Oropharynx nonerythematous, no exudate appreciated.   Neck: No deformities, masses, or tenderness noted.  Lungs:  Normal respiratory effort. Clear to auscultation BL without crackles or wheezes.  Heart: RRR. S1 and S2 normal without gallop, murmur, or rubs.  Abdomen:  BS normoactive. Soft, Nondistended, non-tender.  No masses or organomegaly.  Extremities: No pretibial edema. Mildly improved from prior with right 2nd toe with large cutaneous blister. Mild warmth, redness over dorsal surface of foot. No obvious ulcerations, abrasions. No ingrown toenails.      Assessment & Plan:  Case and plan of care discussed with Dr. Margarito Liner.

## 2011-04-06 NOTE — Assessment & Plan Note (Signed)
BP Readings from Last 3 Encounters:  04/06/11 131/69  03/30/11 172/89  02/23/11 134/81    Basic Metabolic Panel:    Component Value Date/Time   NA 141 01/07/2011 1008   K 4.1 01/07/2011 1008   CL 106 01/07/2011 1008   CO2 28 01/07/2011 1008   BUN 16 01/07/2011 1008   CREATININE 0.63 01/07/2011 1008   CREATININE 0.83 12/02/2010 1627   GLUCOSE 107* 01/07/2011 1008   CALCIUM 8.8 01/07/2011 1008    Assessment: Status:  Last visit was acutely anxious and in pain about her toe  Disease Control: controlled  Progress toward goals: at goal  Barriers to meeting goals: no barriers identified   Plan:  continue current medications

## 2011-04-06 NOTE — Assessment & Plan Note (Signed)
Assessment: Status: Blister noted on 03/29/11 by patient, evaluated on 03/30/11 in clinic, started on 7 day course of Keflex, she will complete this tomorrow.   Progress toward goals: Very mildly improved  Barriers to meeting goals: no barriers identified   Plan:      continue current medications  Recheck in 1 week, can consider wound care consult at that time if not healing versus imaging studies if symptoms worsen.

## 2011-04-06 NOTE — Assessment & Plan Note (Signed)
Assessment: Status:  patient did not fill the Lamisil AT because says pharmacy did not have it.   Plan:      continue current medications

## 2011-04-06 NOTE — Assessment & Plan Note (Signed)
Labs: Lab Results  Component Value Date   HGBA1C 5.9 02/23/2011   HGBA1C 6.7* 11/10/2010   CREATININE 0.63 01/07/2011   CREATININE 0.83 12/02/2010   MICROALBUR 2.25* 07/26/2008   MICRALBCREAT 14.2 07/26/2008   CHOL 213* 11/10/2010   HDL 53 11/10/2010   TRIG 65 11/10/2010    Last eye exam and foot exam:    Component Value Date/Time   HMDIABEYEEXA Normal  12/14/2008   HMDIABFOOTEX Done  12/16/2009     Assessment:     Status: Not having frequent hypoglycemic episodes, but may not need to have such tight control of blood sugars, given age, comorbidities.   Her Lantus was decreased from 40 units to 20 units during her last PCP visit in 02/2011. Can await new HgA1c for follow-up.  Disease Control: controlled  Progress toward goals: at goal  Barriers to meeting goals: no barriers identified   Plan: Glucometer log was not reviewed today, as pt did not have glucometer available for review.      continue current medications  reminded to bring blood glucose meter & log to each visit - patient rereminded of this today.  She will follow-up with me again in 1 week, she is instructed to make sure glucometer is present so we can adjust regimen as needed - she had her Lantus cut in half in response to her A1c of 5.9 in 02/2011. Want to make sure she still has adequate blood sugar control.

## 2011-04-06 NOTE — Patient Instructions (Signed)
   Please follow-up at the clinic in 1 week with me, at which time we will reevaluate your foot blister, diabetes - OR, please follow-up in the clinic sooner if needed.  There have not been changes in your medications.  If you have been started on new medication(s), and you develop symptoms concerning for allergic reaction, including, but not limited to, throat closing, tongue swelling, rash, please stop the medication immediately and call the clinic at [phone], and go to the ER.  If you are diabetic, please bring your meter to your next visit.  If symptoms worsen, or new symptoms arise, please call the clinic or go to the ER.  Please bring all of your medications in a bag to your next visit.

## 2011-04-13 ENCOUNTER — Encounter: Payer: Self-pay | Admitting: Internal Medicine

## 2011-04-13 ENCOUNTER — Ambulatory Visit (INDEPENDENT_AMBULATORY_CARE_PROVIDER_SITE_OTHER): Payer: PRIVATE HEALTH INSURANCE | Admitting: Internal Medicine

## 2011-04-13 VITALS — BP 158/83 | HR 82 | Temp 97.6°F | Wt 180.7 lb

## 2011-04-13 DIAGNOSIS — S90424A Blister (nonthermal), right lesser toe(s), initial encounter: Secondary | ICD-10-CM

## 2011-04-13 DIAGNOSIS — IMO0002 Reserved for concepts with insufficient information to code with codable children: Secondary | ICD-10-CM

## 2011-04-13 DIAGNOSIS — X58XXXA Exposure to other specified factors, initial encounter: Secondary | ICD-10-CM

## 2011-04-13 LAB — GLUCOSE, CAPILLARY: Glucose-Capillary: 101 mg/dL — ABNORMAL HIGH (ref 70–99)

## 2011-04-13 NOTE — Assessment & Plan Note (Signed)
Assessment: Status: Blister noted on 03/29/11 by patient, evaluated on 03/30/11 in clinic, completed 7 day course of Keflex. Recheck 02/18, only slightly improved. Today, 02/25, blister self-expressed several days ago, skin somewhat hyperpigmented. Was expecting more improvement by now. I discussed with Dr. Rogelia Boga and she evaluated pt before discharge home.  Barriers to meeting goals: no barriers identified   Plan:      continue current medications  Get CT foot today evaluate for soft tissue/ bony involvement - cannot do MRI 2/2 ankle screws from prior surgery.  Will follow-up with Pt after the imaging complete - tx as indicated.  Consider wound care referral pending imaging results.

## 2011-04-13 NOTE — Patient Instructions (Signed)
   Please follow-up at the clinic in 1 month, at which time we will reevaluate your blood pressure, diabetes - OR, please follow-up in the clinic sooner if needed.  There have not been changes in your medications.   We are getting a CT of your foot today, if there is something abnormal, I will call you.   If you have been started on new medication(s), and you develop symptoms concerning for allergic reaction, including, but not limited to, throat closing, tongue swelling, rash, please stop the medication immediately and call the clinic at [phone], and go to the ER.  If you are diabetic, please bring your meter to your next visit.  If symptoms worsen, or new symptoms arise, please call the clinic or go to the ER.  Please bring all of your medications in a bag to your next visit.

## 2011-04-13 NOTE — Progress Notes (Signed)
Subjective:    Patient ID: [name] female   DOB: 30-Mar-1933 78 y.o.   MRN: [id number]  HPI: [name] is a 78 y.o. with a PMHx of DMII, HTN, HLD, who presented to clinic today for the following:  1) Right second toe blister - pt has noted a blister on right second toe since 03/29/2011 - of unclear cause, thought possibly due to ill-fitting shoes versus from too closely clipping her toenails. With concern for developing cellulitis, she was started on Keflex x 7 days on 02/11 - which she completed. Was reevaluated 02/18, was improving, but minimally. Was recommended again 1 week follow-up to assess for resolution. Apparently on 02/19, the blister popped overnight, with expression of clear liquid drainage. Has since scabbed in area that was expressed. States that the swelling is slightly reduced and pain is improved. Now hyperpigmented skin. She remains without fevers, chills, nausea, vomiting. Redness and swelling of surrounding skin is slightly improved.   Review of Systems: Per HPI.  Current Outpatient Medications: Medication Sig  . acetaminophen (TYLENOL) 325 MG tablet Take 650 mg by mouth every 6 (six) hours as needed.    Marland Kitchen aspirin 81 MG tablet Take 81 mg by mouth daily.    . carvedilol (COREG) 12.5 MG tablet Take 1 tablet (12.5 mg total) by mouth 2 (two) times daily with a meal.  . [facility] STOOL SOFTENER 100 MG capsule TAKE 1 CAPSULE BY MOUTH DAILY  . fluticasone (FLONASE) 50 MCG/ACT nasal spray Place 2 sprays into the nose daily.  Marland Kitchen gabapentin (NEURONTIN) 300 MG capsule Take 1 capsule (300 mg total) by mouth 3 (three) times daily.  . insulin glargine (LANTUS SOLOSTAR) 100 UNIT/ML injection Inject 20 Units into the skin at bedtime.  Marland Kitchen lisinopril (PRINIVIL,ZESTRIL) 10 MG tablet TAKE 1 TABLET BY MOUTH DAILY  . oxyCODONE-acetaminophen (PERCOCET) 7.5-500 MG per tablet Take 1 tablet by mouth 3 (three) times daily as needed for pain.  . simvastatin (ZOCOR) 20 MG tablet TAKE 1 TABLET  AT BEDTIME  . terbinafine (LAMISIL AT) 1 % cream Apply topically 2 (two) times daily. Apply to affected areas (between toes 3 to 4 and 4 to 5 on the right foot) for two weeks.    Allergies: No Known Allergies  Past Medical History  Diagnosis Date  . Diabetes mellitus 2007    HgA1C (02/20/2010) = 9.2, HgA1C (03/20/2009) = 12.1  . Hyperlipidemia   . Hypertension   . GERD (gastroesophageal reflux disease)   . CVA (cerebrovascular accident) 2006     right embolic stroke, no residual deficits  . Diverticulitis   . CVA (cerebral infarction) 7-yrs ago  . Arthritis   . Dysphagia   . CHF (congestive heart failure)   . DIABETES MELLITUS, TYPE II 12/04/2005  . HYPERLIPIDEMIA 12/04/2005  . HYPERTENSION 12/04/2005  . GERD 12/04/2005  . ARTHRITIS, KNEE 03/24/2006  . Lung mass 07/08/2010  . Meningioma 07/21/2010  . Hemangioma of liver 12/02/2010  . Depression 12/02/2010  . Adenocarcinoma of lung      Right upper lobe adenocarcinoma.     Past Surgical History  Procedure Date  . Abdominal hysterectomy   . Video bronchoscope. 12/29/2007    [place]  . Wide excision of left upper back mass.   . Extracapsular cataract extraction with intraocular     lens implantation.  . Right vats,right thoracotomy,right lower lobectomy with node dissection      Objective:    Physical Exam: Filed Vitals:   04/13/11 1141  BP: 158/83  Pulse: 82  Temp: 97.6 F (36.4 C)     General: Vital signs reviewed and noted. Well-developed, well-nourished, in no acute distress; alert, appropriate and cooperative throughout examination.  Head: Normocephalic, atraumatic.  Neck: No deformities, masses, or tenderness noted.  Lungs:  Normal respiratory effort. Clear to auscultation BL without crackles or wheezes.  Heart: RRR. S1 and S2 normal without gallop, murmur, or rubs.  Abdomen:  BS normoactive. Soft, Nondistended, non-tender.  No masses or organomegaly.  Extremities: No pretibial edema. Mildly improved from  prior with right 2nd toe with cutaneous blister, central small scab from where blister had burst several days ago. Toe hyperpigmented, mildly dullened sensation. Mild warmth, redness over dorsal surface of foot. No obvious ulcerations, abrasions. No ingrown toenails.      Assessment/ Plan:   Case and plan of care discussed with Dr. Blanch Media.

## 2011-04-15 ENCOUNTER — Ambulatory Visit (HOSPITAL_COMMUNITY)
Admission: RE | Admit: 2011-04-15 | Discharge: 2011-04-15 | Disposition: A | Discharge status: Home / Self Care | Payer: PRIVATE HEALTH INSURANCE | Source: Ambulatory Visit | Attending: Internal Medicine | Admitting: Internal Medicine

## 2011-04-15 ENCOUNTER — Other Ambulatory Visit (INDEPENDENT_AMBULATORY_CARE_PROVIDER_SITE_OTHER): Payer: PRIVATE HEALTH INSURANCE

## 2011-04-15 ENCOUNTER — Other Ambulatory Visit: Payer: Self-pay | Admitting: Internal Medicine

## 2011-04-15 DIAGNOSIS — I10 Essential (primary) hypertension: Secondary | ICD-10-CM

## 2011-04-15 DIAGNOSIS — S90424A Blister (nonthermal), right lesser toe(s), initial encounter: Secondary | ICD-10-CM

## 2011-04-15 LAB — BASIC METABOLIC PANEL
BUN: 19 mg/dL (ref 6–23)
Chloride: 105 mEq/L (ref 96–112)
Potassium: 4.5 mEq/L (ref 3.5–5.3)

## 2011-04-15 NOTE — Progress Notes (Signed)
Addended by: Priscella Mann on: 04/15/2011 08:52 AM   Modules accepted: Orders

## 2011-04-26 ENCOUNTER — Other Ambulatory Visit: Payer: Self-pay | Admitting: Internal Medicine

## 2011-04-26 DIAGNOSIS — I5022 Chronic systolic (congestive) heart failure: Secondary | ICD-10-CM

## 2011-05-06 ENCOUNTER — Other Ambulatory Visit: Payer: Self-pay | Admitting: Internal Medicine

## 2011-05-06 DIAGNOSIS — M25511 Pain in right shoulder: Secondary | ICD-10-CM

## 2011-05-06 DIAGNOSIS — I10 Essential (primary) hypertension: Secondary | ICD-10-CM

## 2011-05-06 DIAGNOSIS — E119 Type 2 diabetes mellitus without complications: Secondary | ICD-10-CM

## 2011-05-06 DIAGNOSIS — F329 Major depressive disorder, single episode, unspecified: Secondary | ICD-10-CM

## 2011-05-06 DIAGNOSIS — E785 Hyperlipidemia, unspecified: Secondary | ICD-10-CM

## 2011-05-09 MED ORDER — OXYCODONE-ACETAMINOPHEN 7.5-500 MG PO TABS: 1.0000 | ORAL_TABLET | Freq: Three times a day (TID) | ORAL | Status: DC | PRN

## 2011-05-11 ENCOUNTER — Other Ambulatory Visit: Payer: Self-pay | Admitting: Internal Medicine

## 2011-05-11 ENCOUNTER — Encounter: Payer: Self-pay | Admitting: Internal Medicine

## 2011-05-11 ENCOUNTER — Ambulatory Visit (INDEPENDENT_AMBULATORY_CARE_PROVIDER_SITE_OTHER): Payer: PRIVATE HEALTH INSURANCE | Admitting: Internal Medicine

## 2011-05-11 VITALS — BP 168/93 | HR 92 | Temp 97.8°F | Wt 178.8 lb

## 2011-05-11 DIAGNOSIS — L97519 Non-pressure chronic ulcer of other part of right foot with unspecified severity: Secondary | ICD-10-CM

## 2011-05-11 DIAGNOSIS — I10 Essential (primary) hypertension: Secondary | ICD-10-CM

## 2011-05-11 DIAGNOSIS — M25511 Pain in right shoulder: Secondary | ICD-10-CM

## 2011-05-11 DIAGNOSIS — E785 Hyperlipidemia, unspecified: Secondary | ICD-10-CM

## 2011-05-11 DIAGNOSIS — L97509 Non-pressure chronic ulcer of other part of unspecified foot with unspecified severity: Secondary | ICD-10-CM

## 2011-05-11 DIAGNOSIS — Z79899 Other long term (current) drug therapy: Secondary | ICD-10-CM

## 2011-05-11 DIAGNOSIS — E119 Type 2 diabetes mellitus without complications: Secondary | ICD-10-CM

## 2011-05-11 DIAGNOSIS — F329 Major depressive disorder, single episode, unspecified: Secondary | ICD-10-CM

## 2011-05-11 LAB — GLUCOSE, CAPILLARY: Glucose-Capillary: 124 mg/dL — ABNORMAL HIGH (ref 70–99)

## 2011-05-11 MED ORDER — CITALOPRAM HYDROBROMIDE 20 MG PO TABS: 20.0000 mg | ORAL_TABLET | Freq: Every day | ORAL | Status: DC

## 2011-05-11 MED ORDER — OXYCODONE-ACETAMINOPHEN 7.5-500 MG PO TABS: 1.0000 | ORAL_TABLET | Freq: Three times a day (TID) | ORAL | Status: DC | PRN

## 2011-05-11 NOTE — Assessment & Plan Note (Signed)
There is no evidence of superficial cellulitis/skin and soft tissue infection.   Patient is afebrile, hemodynamically stable, and without signs or symptoms or systemic illness.  I am, however, concerned that her slowly healing wound with discoloration of her second toe reflects persistent ulcer with possible osteomyelitis.  I am unsure why the previously ordered CT scan was not completed.  Will order a non-contrasted CT scan of her right foot to evaluate for possible osteomyelitis.  She cannot have an MRI as a result of metal hardware present in her right ankle; it is possible this may also create interfering artifact on CT scan - if that is so, then she may need nuclear imaging study (three phase bone scan, gallium, etc) for further work up.  WIll also check ESR and CRP today; if elevated, these tests would help establish a diagnosis of osteomyelitis and may be used to monitor response to antibiotic therapy.  Her peripheral pulses are intact bilaterally; this lowers my suspicion for vascular phenomenon (ie occlusion) contributing to her pain and toe discoloration; may need to consider work up for this if her CT/radiologic studies are unrevealing.

## 2011-05-11 NOTE — Patient Instructions (Signed)
Please get the CT scan of your foot.  This will tell us if there is an infection deeper in your toe/foot that may be involving the bone. I will call you with the results of the CT scan. Keep taking all of your medicine as directed. [facility] refill for [facility].

## 2011-05-11 NOTE — Progress Notes (Signed)
Subjective:     Patient ID: [name], female   DOB: 15-Jul-1933, 78 y.o.   MRN: [id number]  HPI Patient is here today for follow up of right foot blister.  In Feb., 2012 patient was treated with a 7 day course of Keflex and a CT scan was ordered to evaluate for the possibility of osteomyelitis.  This was not done; patients daughter states radiology needed additional information from a physician before proceeding with test..  Patient states she experiences constant pain in her 2nd right toe.  The pain radiates into the top of her foot and occasionally wakes her from sleep.  She does not describe any specific aggravating factors.  She does not describe any alleviating factors.  She denies fevers or chills.  She denies any new blister formation or abnormal drainage.     Review of Systems Constitutional: Negative for fever, chills, diaphoresis, activity change, appetite change, fatigue and unexpected weight change.  HENT: Negative for hearing loss, congestion and neck stiffness.   Eyes: Negative for photophobia, pain and visual disturbance.  Respiratory: Negative for cough, chest tightness, shortness of breath and wheezing.   Cardiovascular: Negative for chest pain and palpitations.  Gastrointestinal: Negative for abdominal pain, blood in stool and anal bleeding.  Genitourinary: Negative for dysuria, hematuria and difficulty urinating.  Musculoskeletal: Negative for joint swelling.  Neurological: Negative for dizziness, syncope, speech difficulty, weakness, numbness and headaches.      Objective:   Physical Exam General: Vital signs reviewed. Well-developed, well-nourished, in no acute distress; alert, appropriate and cooperative throughout examination.  Head: Normocephalic, atraumatic.  Neck: No deformities, masses, or tenderness noted.  Lungs:  Normal respiratory effort. Clear to auscultation bilaterally without crackles, ronchi, or wheezes.  Good air movement  Heart: Normal rate, regular  rhythm. S1 and S2 normal without gallop, murmur, or rubs.  Abdomen:  BS normoactive. Soft, Nondistended, non-tender.  No masses or organomegaly.  Extremities: No  edema.  Right 2nd toe appears dusky and hyperpigmented; small, crusted lesion on medial aspect of to.  No tenderness to palpation of toe.  No induration, erythema, increased warmth, palpable fluctuance, or drainage expressible on exam.  Some diffuse tenderness to palpation along dorsal foot. No ingrown toenails. +2 PT and DP pulses bilaterally.      Assessment/Plan:

## 2011-05-11 NOTE — Assessment & Plan Note (Signed)
Pt is on Percocet for management of her chronic arthritic pain.  There is a note that her refill for march was submitted on 05/09/11; this refill was not completed -  refilled as a "phone-in" was not filled by pharmacy because percocet refilled cannot be submitted via phone; only by physical script.  The patient has not violated her contract.  Will provide her with a script today.

## 2011-05-18 ENCOUNTER — Ambulatory Visit (HOSPITAL_COMMUNITY)
Admission: RE | Admit: 2011-05-18 | Discharge: 2011-05-18 | Disposition: A | Discharge status: Home / Self Care | Payer: PRIVATE HEALTH INSURANCE | Source: Ambulatory Visit | Attending: Internal Medicine | Admitting: Internal Medicine

## 2011-05-18 DIAGNOSIS — M7730 Calcaneal spur, unspecified foot: Secondary | ICD-10-CM | POA: Insufficient documentation

## 2011-05-18 DIAGNOSIS — IMO0002 Reserved for concepts with insufficient information to code with codable children: Secondary | ICD-10-CM | POA: Insufficient documentation

## 2011-05-18 DIAGNOSIS — L97519 Non-pressure chronic ulcer of other part of right foot with unspecified severity: Secondary | ICD-10-CM

## 2011-05-18 DIAGNOSIS — M79609 Pain in unspecified limb: Secondary | ICD-10-CM | POA: Insufficient documentation

## 2011-05-18 DIAGNOSIS — E119 Type 2 diabetes mellitus without complications: Secondary | ICD-10-CM | POA: Insufficient documentation

## 2011-06-17 ENCOUNTER — Other Ambulatory Visit: Payer: Self-pay | Admitting: Internal Medicine

## 2011-06-17 DIAGNOSIS — F329 Major depressive disorder, single episode, unspecified: Secondary | ICD-10-CM

## 2011-07-07 ENCOUNTER — Telehealth: Payer: Self-pay | Admitting: Internal Medicine

## 2011-07-07 ENCOUNTER — Other Ambulatory Visit: Payer: PRIVATE HEALTH INSURANCE | Admitting: Lab

## 2011-07-07 ENCOUNTER — Other Ambulatory Visit (HOSPITAL_COMMUNITY): Payer: PRIVATE HEALTH INSURANCE

## 2011-07-07 NOTE — Telephone Encounter (Signed)
pt missed 5/21 ct,r/s all appts to 6/3 lab and ct and md on 6/5,mailed to pt as well   aom

## 2011-07-08 ENCOUNTER — Ambulatory Visit: Payer: PRIVATE HEALTH INSURANCE | Admitting: Internal Medicine

## 2011-07-20 ENCOUNTER — Other Ambulatory Visit (HOSPITAL_BASED_OUTPATIENT_CLINIC_OR_DEPARTMENT_OTHER): Payer: PRIVATE HEALTH INSURANCE | Admitting: Lab

## 2011-07-20 ENCOUNTER — Ambulatory Visit (HOSPITAL_COMMUNITY)
Admission: RE | Admit: 2011-07-20 | Discharge: 2011-07-20 | Disposition: A | Discharge status: Home / Self Care | Payer: PRIVATE HEALTH INSURANCE | Source: Ambulatory Visit | Attending: Internal Medicine | Admitting: Internal Medicine

## 2011-07-20 DIAGNOSIS — Z902 Acquired absence of lung [part of]: Secondary | ICD-10-CM | POA: Insufficient documentation

## 2011-07-20 DIAGNOSIS — J90 Pleural effusion, not elsewhere classified: Secondary | ICD-10-CM | POA: Insufficient documentation

## 2011-07-20 DIAGNOSIS — R0602 Shortness of breath: Secondary | ICD-10-CM | POA: Insufficient documentation

## 2011-07-20 DIAGNOSIS — K7689 Other specified diseases of liver: Secondary | ICD-10-CM | POA: Insufficient documentation

## 2011-07-20 DIAGNOSIS — R059 Cough, unspecified: Secondary | ICD-10-CM | POA: Insufficient documentation

## 2011-07-20 DIAGNOSIS — I2789 Other specified pulmonary heart diseases: Secondary | ICD-10-CM | POA: Insufficient documentation

## 2011-07-20 DIAGNOSIS — C3490 Malignant neoplasm of unspecified part of unspecified bronchus or lung: Secondary | ICD-10-CM

## 2011-07-20 DIAGNOSIS — R05 Cough: Secondary | ICD-10-CM | POA: Insufficient documentation

## 2011-07-20 LAB — CMP (CANCER CENTER ONLY)
Albumin: 3.4 g/dL (ref 3.3–5.5)
Alkaline Phosphatase: 63 U/L (ref 26–84)
BUN, Bld: 14 mg/dL (ref 7–22)
CO2: 30 mEq/L (ref 18–33)
Calcium: 8.6 mg/dL (ref 8.0–10.3)
Chloride: 105 mEq/L (ref 98–108)
Glucose, Bld: 129 mg/dL — ABNORMAL HIGH (ref 73–118)
Potassium: 4.2 mEq/L (ref 3.3–4.7)

## 2011-07-20 LAB — CBC WITH DIFFERENTIAL/PLATELET
Basophils Absolute: 0 10*3/uL (ref 0.0–0.1)
Eosinophils Absolute: 0.1 10*3/uL (ref 0.0–0.5)
HGB: 9.9 g/dL — ABNORMAL LOW (ref 11.6–15.9)
MONO#: 0.7 10*3/uL (ref 0.1–0.9)
NEUT#: 3.2 10*3/uL (ref 1.5–6.5)
RDW: 15.4 % — ABNORMAL HIGH (ref 11.2–14.5)
WBC: 6 10*3/uL (ref 3.9–10.3)
lymph#: 2 10*3/uL (ref 0.9–3.3)

## 2011-07-22 ENCOUNTER — Ambulatory Visit (HOSPITAL_BASED_OUTPATIENT_CLINIC_OR_DEPARTMENT_OTHER): Payer: PRIVATE HEALTH INSURANCE | Admitting: Internal Medicine

## 2011-07-22 VITALS — BP 168/83 | HR 86 | Temp 97.5°F | Ht 64.0 in | Wt 197.9 lb

## 2011-07-22 DIAGNOSIS — C3410 Malignant neoplasm of upper lobe, unspecified bronchus or lung: Secondary | ICD-10-CM

## 2011-07-22 DIAGNOSIS — D649 Anemia, unspecified: Secondary | ICD-10-CM

## 2011-07-22 DIAGNOSIS — C3490 Malignant neoplasm of unspecified part of unspecified bronchus or lung: Secondary | ICD-10-CM

## 2011-07-22 DIAGNOSIS — R5381 Other malaise: Secondary | ICD-10-CM

## 2011-07-22 NOTE — Progress Notes (Signed)
[facility] [phone]   [phone]  OFFICE PROGRESS NOTE  Almyra Deforest, MD, MD [address] [place] [address]  DIAGNOSIS: Stage IB (T2a., N0, M0) non-small cell lung cancer, adenocarcinoma with positive EGFR mutation and negative ALK gene translocation diagnosed in October of 2012.  PRIOR THERAPY: Status post right lower lobectomy with lymph node dissection under the care of Dr. Edwyna Shell on 12/15/2010  CURRENT THERAPY: Observation.  INTERVAL HISTORY: [name] 78 y.o. female returns to the clinic today for followup visit accompanied by her daughter. The patient has no complaints today except for mild fatigue and dry cough. She also continues to have some soreness on the right side of the chest at the incision site. She denied having any significant shortness of breath except with exertion, no hemoptysis. She has no significant weight loss or night sweats. The patient has repeat CT scan of the chest performed recently and she is here today for evaluation and discussion of her scan results.  MEDICAL HISTORY: Past Medical History  Diagnosis Date  . Diabetes mellitus 2007    HgA1C (02/20/2010) = 9.2, HgA1C (03/20/2009) = 12.1  . Hyperlipidemia   . Hypertension   . GERD (gastroesophageal reflux disease)   . CVA (cerebrovascular accident) 2006     right embolic stroke, no residual deficits  . Diverticulitis   . CVA (cerebral infarction) 7-yrs ago  . Arthritis   . Dysphagia   . CHF (congestive heart failure)   . DIABETES MELLITUS, TYPE II 12/04/2005  . HYPERLIPIDEMIA 12/04/2005  . HYPERTENSION 12/04/2005  . GERD 12/04/2005  . ARTHRITIS, KNEE 03/24/2006  . Lung mass 07/08/2010  . Meningioma 07/21/2010  . Hemangioma of liver 12/02/2010  . Depression 12/02/2010  . Adenocarcinoma of lung      Right upper lobe adenocarcinoma.     ALLERGIES:   has no known allergies.  MEDICATIONS:  Current Outpatient Prescriptions    Medication Sig Dispense Refill  . acetaminophen (TYLENOL) 325 MG tablet Take 650 mg by mouth every 6 (six) hours as needed.        Marland Kitchen aspirin 81 MG tablet Take 81 mg by mouth daily.        . carvedilol (COREG) 12.5 MG tablet Take 1 tablet (12.5 mg total) by mouth 2 (two) times daily with a meal.  60 tablet  11  . citalopram (CELEXA) 20 MG tablet Take 1 tablet (20 mg total) by mouth daily.  30 tablet  3  . citalopram (CELEXA) 20 MG tablet TAKE 1 TABLET (20 MG TOTAL) BY MOUTH DAILY.  30 tablet  0  . CVS STOOL SOFTENER 100 MG capsule TAKE 1 CAPSULE BY MOUTH DAILY  30 capsule  3  . gabapentin (NEURONTIN) 300 MG capsule Take 1 capsule (300 mg total) by mouth 3 (three) times daily.  90 capsule  2  . insulin glargine (LANTUS SOLOSTAR) 100 UNIT/ML injection Inject 20 Units into the skin at bedtime.  10 mL  0  . lisinopril (PRINIVIL,ZESTRIL) 10 MG tablet TAKE 1 TABLET BY MOUTH DAILY  30 tablet  3  . oxyCODONE-acetaminophen (PERCOCET) 7.5-500 MG per tablet Take 1 tablet by mouth 3 (three) times daily as needed for pain.  90 tablet  0  . simvastatin (ZOCOR) 20 MG tablet TAKE 1 TABLET AT BEDTIME  30 tablet  3  . fluticasone (FLONASE) 50 MCG/ACT nasal spray Place 2 sprays into the nose daily.  16 g  0  SURGICAL HISTORY:  Past Surgical History  Procedure Date  . Abdominal hysterectomy   . Video bronchoscope. 12/29/2007    [place]  . Wide excision of left upper back mass.   . Extracapsular cataract extraction with intraocular     lens implantation.  . Right vats,right thoracotomy,right lower lobectomy with node dissection     REVIEW OF SYSTEMS:  A comprehensive review of systems was negative except for: Constitutional: positive for fatigue Respiratory: positive for cough and dyspnea on exertion   PHYSICAL EXAMINATION: General appearance: alert, cooperative, fatigued and no distress Neck: no adenopathy Lymph nodes: Cervical, supraclavicular, and axillary nodes normal. Resp: clear to auscultation  bilaterally Cardio: regular rate and rhythm, S1, S2 normal, no murmur, click, rub or gallop GI: soft, non-tender; bowel sounds normal; no masses,  no organomegaly Extremities: extremities normal, atraumatic, no cyanosis or edema  ECOG PERFORMANCE STATUS: 1 - Symptomatic but completely ambulatory  Blood pressure 168/83, pulse 86, temperature 97.5 F (36.4 C), temperature source Oral, height 5\' 4"  (1.626 m), weight 197 lb 14.4 oz (89.767 kg).  LABORATORY DATA: Lab Results  Component Value Date   WBC 6.0 07/20/2011   HGB 9.9* 07/20/2011   HCT 31.5* 07/20/2011   MCV 76.7* 07/20/2011   PLT 298 07/20/2011      Chemistry      Component Value Date/Time   NA 146* 07/20/2011 1527   NA 142 04/15/2011 0930   K 4.2 07/20/2011 1527   K 4.5 04/15/2011 0930   CL 105 07/20/2011 1527   CL 105 04/15/2011 0930   CO2 30 07/20/2011 1527   CO2 28 04/15/2011 0930   BUN 14 07/20/2011 1527   BUN 19 04/15/2011 0930   CREATININE 0.9 07/20/2011 1527   CREATININE 0.63 01/07/2011 1008      Component Value Date/Time   CALCIUM 8.6 07/20/2011 1527   CALCIUM 9.2 04/15/2011 0930   ALKPHOS 63 07/20/2011 1527   ALKPHOS 53 01/07/2011 1008   AST 20 07/20/2011 1527   AST 11 01/07/2011 1008   ALT <8 01/07/2011 1008   BILITOT 0.50 07/20/2011 1527   BILITOT 0.3 01/07/2011 1008       RADIOGRAPHIC STUDIES: Ct Chest Wo Contrast  07/20/2011  *RADIOLOGY REPORT*  Clinical Data: Lung cancer.  Cough and shortness of breath.  CT CHEST WITHOUT CONTRAST  Technique:  Multidetector CT imaging of the chest was performed following the standard protocol without IV contrast.  Comparison: PET CT 11/18/2010 and CT chest 11/11/2010.  Findings: Triangular-shaped soft tissue in the prevascular space is likely thymus.  No definite pathologically enlarged mediastinal lymph nodes.  Hilar regions are difficult to definitively evaluate without IV contrast.  No axillary adenopathy.  Atherosclerotic calcification of the arterial vasculature.  Pulmonary arteries are  enlarged.  Heart size normal.  No pericardial effusion.  Small right pleural effusion, partially loculated.  Interval right upper lobectomy.  Volume loss in the right middle lobe, adjacent to the suture line.  A few scattered tiny pulmonary nodules are stable.  Airway is otherwise unremarkable.  Esophagus is dilated.  Incidental imaging of the upper abdomen shows a low attenuation lesion in the right hepatic lobe, with somewhat ill-defined borders, measuring 4.9 x 6.9 cm (previously 4.2 x 6 x 1 cm).  No worrisome lytic or sclerotic lesions.  IMPRESSION:  1.  Postoperative changes of right upper lobectomy with volume loss in the right middle lobe. No evidence of recurrent or metastatic disease. 2.  Small, partially loculated right pleural effusion. 3.  Pulmonary arterial hypertension. 4.  Right hepatic lobe lesion has enlarged from prior exams but has been shown to represent a hemangioma on prior imaging.  Original Report Authenticated By: Reyes Ivan, M.D.    ASSESSMENT: This is a very pleasant 78 years old [place] American female with history of stage Ib non-small cell lung cancer adenocarcinoma with positive EGFR mutation. She status post right lower lobectomy. The patient is currently on observation has no evidence for disease recurrence.  PLAN: I discussed the scan results with the patient and her daughter. I recommended for her to continue on observation for now with repeat CT scan of the chest with contrast in 6 months. She would come back for followup visit at that time. Her fatigue is most likely secondary to iron deficiency anemia. I recommended for the patient to continue on oral iron supplement one to 2 tablets on daily basis. She was advised to call me immediately if she has any concerning symptoms in the interval. All questions were answered. The patient knows to call the clinic with any problems, questions or concerns. We can certainly see the patient much sooner if necessary.

## 2011-07-23 ENCOUNTER — Other Ambulatory Visit: Payer: Self-pay | Admitting: Internal Medicine

## 2011-07-23 DIAGNOSIS — F329 Major depressive disorder, single episode, unspecified: Secondary | ICD-10-CM

## 2011-07-24 NOTE — Telephone Encounter (Signed)
Rx called in to pharmacy. 

## 2011-09-01 ENCOUNTER — Other Ambulatory Visit: Payer: Self-pay | Admitting: *Deleted

## 2011-09-01 ENCOUNTER — Other Ambulatory Visit: Payer: Self-pay | Admitting: Internal Medicine

## 2011-09-01 DIAGNOSIS — F329 Major depressive disorder, single episode, unspecified: Secondary | ICD-10-CM

## 2011-09-01 DIAGNOSIS — E119 Type 2 diabetes mellitus without complications: Secondary | ICD-10-CM

## 2011-09-01 DIAGNOSIS — I10 Essential (primary) hypertension: Secondary | ICD-10-CM

## 2011-09-01 DIAGNOSIS — M25511 Pain in right shoulder: Secondary | ICD-10-CM

## 2011-09-01 DIAGNOSIS — E785 Hyperlipidemia, unspecified: Secondary | ICD-10-CM

## 2011-09-01 MED ORDER — DOCUSATE SODIUM 100 MG PO CAPS: 100.0000 mg | ORAL_CAPSULE | Freq: Every day | ORAL | Status: DC

## 2011-09-01 MED ORDER — CITALOPRAM HYDROBROMIDE 20 MG PO TABS: 20.0000 mg | ORAL_TABLET | Freq: Every day | ORAL | Status: DC

## 2011-09-01 MED ORDER — OXYCODONE-ACETAMINOPHEN 7.5-500 MG PO TABS: 1.0000 | ORAL_TABLET | Freq: Three times a day (TID) | ORAL | Status: DC | PRN

## 2011-09-01 MED ORDER — SIMVASTATIN 20 MG PO TABS: 20.0000 mg | ORAL_TABLET | Freq: Every day | ORAL | Status: DC

## 2011-09-01 MED ORDER — LISINOPRIL 10 MG PO TABS: 10.0000 mg | ORAL_TABLET | Freq: Every day | ORAL | Status: DC

## 2011-09-03 ENCOUNTER — Other Ambulatory Visit: Payer: Self-pay | Admitting: Internal Medicine

## 2011-09-03 DIAGNOSIS — E785 Hyperlipidemia, unspecified: Secondary | ICD-10-CM

## 2011-09-03 DIAGNOSIS — M25511 Pain in right shoulder: Secondary | ICD-10-CM

## 2011-09-03 DIAGNOSIS — I10 Essential (primary) hypertension: Secondary | ICD-10-CM

## 2011-09-03 DIAGNOSIS — E119 Type 2 diabetes mellitus without complications: Secondary | ICD-10-CM

## 2011-09-03 MED ORDER — OXYCODONE-ACETAMINOPHEN 7.5-500 MG PO TABS: 1.0000 | ORAL_TABLET | Freq: Three times a day (TID) | ORAL | Status: DC | PRN

## 2011-09-07 ENCOUNTER — Encounter: Payer: Self-pay | Admitting: Internal Medicine

## 2011-09-07 ENCOUNTER — Ambulatory Visit (INDEPENDENT_AMBULATORY_CARE_PROVIDER_SITE_OTHER): Payer: PRIVATE HEALTH INSURANCE | Admitting: Internal Medicine

## 2011-09-07 VITALS — BP 174/84 | HR 89 | Temp 97.4°F | Ht 64.0 in | Wt 202.2 lb

## 2011-09-07 DIAGNOSIS — R350 Frequency of micturition: Secondary | ICD-10-CM | POA: Insufficient documentation

## 2011-09-07 DIAGNOSIS — E785 Hyperlipidemia, unspecified: Secondary | ICD-10-CM

## 2011-09-07 DIAGNOSIS — Z79899 Other long term (current) drug therapy: Secondary | ICD-10-CM

## 2011-09-07 DIAGNOSIS — E119 Type 2 diabetes mellitus without complications: Secondary | ICD-10-CM

## 2011-09-07 DIAGNOSIS — I10 Essential (primary) hypertension: Secondary | ICD-10-CM

## 2011-09-07 LAB — POCT GLYCOSYLATED HEMOGLOBIN (HGB A1C): Hemoglobin A1C: 7.9

## 2011-09-07 LAB — POCT URINALYSIS DIPSTICK
Glucose, UA: NEGATIVE
Spec Grav, UA: >=1.03
Urobilinogen, UA: 0.2

## 2011-09-07 LAB — GLUCOSE, CAPILLARY: Glucose-Capillary: 175 mg/dL — ABNORMAL HIGH (ref 70–99)

## 2011-09-07 MED ORDER — LISINOPRIL 20 MG PO TABS: 20.0000 mg | ORAL_TABLET | Freq: Every day | ORAL | Status: DC

## 2011-09-07 MED ORDER — CIPROFLOXACIN HCL 250 MG PO TABS: 250.0000 mg | ORAL_TABLET | Freq: Two times a day (BID) | ORAL | Status: AC

## 2011-09-07 NOTE — Patient Instructions (Addendum)
1.You have a urine tract infection. I will start you on an antibiotics: Ciprofloxacin  2. We will increase lisinopril from 10 mg to 20 mg daily ( take 2 tablets a day until the bottle is done and  Pick up a new prescription  3. If you have loose stool stop the the stool softner

## 2011-09-07 NOTE — Assessment & Plan Note (Addendum)
Hemoglobin A1c is 7.9 today. My goal for her is 50 considering age and increased risk of hypoglycemia in the past. I'll continue current regimen including Lantus 20 units daily at bedtime. Patient did not bring her glucometer with her. Recommended to check blood sugars on a daily basis and bring in demeanor during the next office visit.  Was evaluated by ophthalmology in September of 2012 and is scheduled to see him in 2 months. Foot exam was performed today.

## 2011-09-07 NOTE — Assessment & Plan Note (Addendum)
Last dose of percocet was taken last night . I'll obtain a urine drug screen today.

## 2011-09-07 NOTE — Assessment & Plan Note (Signed)
Blood pressure elevated. I will increase lisinopril from 10 mg to 20 mg daily and have her reevaluated in 2 weeks. During the next office visit patient needs a basic metabolic panel and possible changes in management of her blood pressure continues to be elevated.

## 2011-09-07 NOTE — Assessment & Plan Note (Signed)
Urine was obtained and showed pyuria. This was started on ciprofloxacin 250 mg by mouth twice a day for 7 days. The urine culture was sent out for possible changes in management. I informed the patient that we'll call for results.

## 2011-09-07 NOTE — Assessment & Plan Note (Signed)
Out check lipid panel today for possible changes in management.

## 2011-09-07 NOTE — Progress Notes (Signed)
Subjective:   Patient ID: [name] female   DOB: 1933/12/30 78 y.o.   MRN: [id number]  HPI: [name] is a 78 y.o. female with PMH significant as outlined below who presented to the clinic for a regular follow up.   1. Urinary incontinence: it started 1-2 weeks ago, noted increased frequency, urgency, strange feeling,  but denies any burning sensation,  Flank pain, fever or chills  2. Bowel incontinence : one month ago he had loose stool, and had trouble to hold it and occasionally accident but since atleast 3 weeks she had no further episodes.    3. Weakness: not more then usual . No falls  4. Headache: occasioannly, last week she had for a couple of days, located in the frontal area, aching in character, not associated with blurry vision, weakness in her legs, no trouble talking . After taking tylenol and it resolved.      Past Medical History  Diagnosis Date  . Diabetes mellitus 2007    HgA1C (02/20/2010) = 9.2, HgA1C (03/20/2009) = 12.1  . Hyperlipidemia   . Hypertension   . GERD (gastroesophageal reflux disease)   . CVA (cerebrovascular accident) 2006     right embolic stroke, no residual deficits  . Diverticulitis   . CVA (cerebral infarction) 7-yrs ago  . Arthritis   . Dysphagia   . CHF (congestive heart failure)   . DIABETES MELLITUS, TYPE II 12/04/2005  . HYPERLIPIDEMIA 12/04/2005  . HYPERTENSION 12/04/2005  . GERD 12/04/2005  . ARTHRITIS, KNEE 03/24/2006  . Lung mass 07/08/2010  . Meningioma 07/21/2010  . Hemangioma of liver 12/02/2010  . Depression 12/02/2010  . Adenocarcinoma of lung      Right upper lobe adenocarcinoma.    Current Outpatient Prescriptions  Medication Sig Dispense Refill  . acetaminophen (TYLENOL) 325 MG tablet Take 650 mg by mouth every 6 (six) hours as needed.        Marland Kitchen aspirin 81 MG tablet Take 81 mg by mouth daily.        . carvedilol (COREG) 12.5 MG tablet Take 1 tablet (12.5 mg total) by mouth 2 (two) times daily with a  meal.  60 tablet  11  . citalopram (CELEXA) 20 MG tablet TAKE 1 TABLET (20 MG TOTAL) BY MOUTH DAILY.  30 tablet  0  . citalopram (CELEXA) 20 MG tablet Take 1 tablet (20 mg total) by mouth daily.  90 tablet  0  . docusate sodium (CVS STOOL SOFTENER) 100 MG capsule Take 1 capsule (100 mg total) by mouth daily.  90 capsule  0  . fluticasone (FLONASE) 50 MCG/ACT nasal spray Place 2 sprays into the nose daily.  16 g  0  . gabapentin (NEURONTIN) 300 MG capsule Take 1 capsule (300 mg total) by mouth 3 (three) times daily.  90 capsule  2  . insulin glargine (LANTUS SOLOSTAR) 100 UNIT/ML injection Inject 20 Units into the skin at bedtime.  10 mL  0  . lisinopril (PRINIVIL,ZESTRIL) 10 MG tablet Take 1 tablet (10 mg total) by mouth daily.  90 tablet  0  . oxyCODONE-acetaminophen (PERCOCET) 7.5-500 MG per tablet Take 1 tablet by mouth 3 (three) times daily as needed for pain.  90 tablet  0  . simvastatin (ZOCOR) 20 MG tablet Take 1 tablet (20 mg total) by mouth daily.  90 tablet  0   Family History  Problem Relation Age of Onset  . Hyperlipidemia Brother   . Hypertension Brother   .  Diabetes Brother    History   Social History  . Marital Status: Widowed    Spouse Name: N/A    Number of Children: N/A  . Years of Education: N/A   Social History Main Topics  . Smoking status: Former Smoker    Types: Cigarettes    Quit date: 02/17/2000  . Smokeless tobacco: Never Used  . Alcohol Use: No  . Drug Use: No  . Sexually Active: None   Other Topics Concern  . None   Social History Narrative   Patient requests to use [employer] for her diabets testing supplies as of 09/05/2009.   Review of Systems: Constitutional: Denies fever, chills, diaphoresis, appetite change and fatigue.  HEENT: Denies  congestion,  trouble swallowing, Respiratory: Denies SOB, DOE, cough, chest tightness,  and wheezing.   Cardiovascular: Denies chest pain, palpitations but noted leg swelling.  Gastrointestinal:  Denies nausea, vomiting, abdominal pain,  Constipation but occasionally loose stool Genitourinary: Noted urgency, frequency, hematuria, Musculoskeletal: knee pain  Neurological: Denies dizziness,    Objective:  Physical Exam: Filed Vitals:   09/07/11 1516  BP: 174/84  Pulse: 89  Temp: 97.4 F (36.3 C)  TempSrc: Oral  Height: 5\' 4"  (1.626 m)  Weight: 202 lb 3.2 oz (91.717 kg)  SpO2: 100%   Constitutional: Vital signs reviewed.  Patient is a well-developed and well-nourished woman in no acute distress and cooperative with exam. Alert and oriented x3.  Head: Normocephalic and atraumatic Neck: Supple,  Cardiovascular: RRR, S1 normal, S2 normal, pulses symmetric and intact bilaterally Pulmonary/Chest: CTAB, no wheezes, rales, or rhonchi Abdominal: Soft. Non-tender, non-distended, bowel sounds are normal,  GU: no CVA tenderness Neurological: A&O x3, Strength is normal and symmetric bilaterally, no focal motor deficit, sensory intact to light touch bilaterally.  Skin: Warm, dry and intact. No rash, cyanosis, or clubbing.

## 2011-09-08 LAB — PRESCRIPTION ABUSE MONITORING 15P, URINE
Barbiturate Screen, Urine: NEGATIVE ng/mL
Buprenorphine, Urine: NEGATIVE ng/mL
Carisoprodol, Urine: NEGATIVE ng/mL
Meperidine, Ur: NEGATIVE ng/mL
Propoxyphene: NEGATIVE ng/mL
Tramadol Scrn, Ur: NEGATIVE ng/mL

## 2011-09-09 LAB — URINE CULTURE: Colony Count: >=100000

## 2011-09-10 LAB — BENZODIAZEPINES (GC/LC/MS), URINE
Clonazepam metabolite (GC/LC/MS), ur confirm: NEGATIVE ng/mL
Estazolam (GC/LC/MS), ur confirm: NEGATIVE ng/mL
Flurazepam metabolite (GC/LC/MS), ur confirm: NEGATIVE ng/mL
Lorazepam (GC/LC/MS), ur confirm: NEGATIVE ng/mL
Midazolam (GC/LC/MS), ur confirm: NEGATIVE ng/mL
Nordiazepam (GC/LC/MS), ur confirm: NEGATIVE ng/mL
Oxazepam (GC/LC/MS), ur confirm: NEGATIVE ng/mL
Temazepam (GC/LC/MS), ur confirm: NEGATIVE ng/mL

## 2011-09-10 LAB — OPIATES/OPIOIDS (LC/MS-MS)
Codeine Urine: NEGATIVE ng/mL
Heroin (6-AM), UR: NEGATIVE ng/mL
Morphine Urine: NEGATIVE ng/mL

## 2011-09-23 ENCOUNTER — Encounter: Payer: PRIVATE HEALTH INSURANCE | Admitting: Internal Medicine

## 2011-09-29 ENCOUNTER — Encounter: Payer: Self-pay | Admitting: Internal Medicine

## 2011-09-29 ENCOUNTER — Ambulatory Visit (INDEPENDENT_AMBULATORY_CARE_PROVIDER_SITE_OTHER): Payer: PRIVATE HEALTH INSURANCE | Admitting: Internal Medicine

## 2011-09-29 VITALS — BP 138/68 | HR 81 | Temp 98.1°F | Ht 64.0 in | Wt 206.5 lb

## 2011-09-29 DIAGNOSIS — E119 Type 2 diabetes mellitus without complications: Secondary | ICD-10-CM

## 2011-09-29 DIAGNOSIS — R350 Frequency of micturition: Secondary | ICD-10-CM

## 2011-09-29 DIAGNOSIS — I10 Essential (primary) hypertension: Secondary | ICD-10-CM

## 2011-09-29 DIAGNOSIS — I509 Heart failure, unspecified: Secondary | ICD-10-CM

## 2011-09-29 DIAGNOSIS — I5022 Chronic systolic (congestive) heart failure: Secondary | ICD-10-CM

## 2011-09-29 LAB — GLUCOSE, CAPILLARY: Glucose-Capillary: 163 mg/dL — ABNORMAL HIGH (ref 70–99)

## 2011-09-29 MED ORDER — CARVEDILOL 12.5 MG PO TABS: ORAL_TABLET | ORAL | Status: DC

## 2011-09-29 NOTE — Assessment & Plan Note (Signed)
Given history is significant systolic heart failure (EF 15-20% with mild left ventricular hypertrophy), patient's goal heart rate is 55-60. In order to maximize patient's heart failure regimen, and avoid hospitalization, I will increase her Coreg to 25 mg in the morning and 12.5 mg in the evening. If she cannot tolerate this, she will call her clinic and let us know. If her heart rate can further tolerate increased dosage, her goal will be Coreg 25 mg twice daily. She will return in one month for blood pressure and heart failure followup.

## 2011-09-29 NOTE — Assessment & Plan Note (Signed)
Patient completed therapy with ciprofloxacin 250 mg by mouth twice daily for 7 days. Symptoms have resolved. No recent episodes of urinary incontinence.

## 2011-09-29 NOTE — Progress Notes (Signed)
Subjective:   Patient ID: [name] female   DOB: 02/12/1934 78 y.o.   MRN: [id number]  HPI: [name] is a 78 y.o. woman with history of diabetes, hypertension, hyperlipidemia, CVA, systolic heart failure who presents today for acute visit followup. She was seen in clinic on July 22 by her PCP for routine followup. At that visit she complained of urinary frequency and incontinence and was found to have a urinary tract infection. Since then, symptoms have resolved as she has completed treatment with ciprofloxacin for 7 days. No recent episodes of urinary or bowel incontinence.  Also, at her visit on 09/07/2011, her blood pressure was elevated to systolic greater than 170. At that time, lisinopril was increased to 20 mg daily, and she has been tolerating the dosage increase well.  She denies chest pain, shortness of breath, dizziness, vision changes, and headache.  Today she is accompanied by her daughter and granddaughter.   Past Medical History  Diagnosis Date  . Diabetes mellitus 2007    HgA1C (02/20/2010) = 9.2, HgA1C (03/20/2009) = 12.1  . Hyperlipidemia   . Hypertension   . GERD (gastroesophageal reflux disease)   . CVA (cerebrovascular accident) 2006     right embolic stroke, no residual deficits  . Diverticulitis   . CVA (cerebral infarction) 7-yrs ago  . Arthritis   . Dysphagia   . CHF (congestive heart failure)   . DIABETES MELLITUS, TYPE II 12/04/2005  . HYPERLIPIDEMIA 12/04/2005  . HYPERTENSION 12/04/2005  . GERD 12/04/2005  . ARTHRITIS, KNEE 03/24/2006  . Lung mass 07/08/2010  . Meningioma 07/21/2010  . Hemangioma of liver 12/02/2010  . Depression 12/02/2010  . Adenocarcinoma of lung      Right upper lobe adenocarcinoma.    Current Outpatient Prescriptions  Medication Sig Dispense Refill  . aspirin 81 MG tablet Take 81 mg by mouth daily.        . carvedilol (COREG) 12.5 MG tablet Take 25 mg (2 tablets) in the morning, at 12.5 mg (1 tablet) in the  evening.  90 tablet  11  . citalopram (CELEXA) 20 MG tablet TAKE 1 TABLET (20 MG TOTAL) BY MOUTH DAILY.  30 tablet  0  . docusate sodium (CVS STOOL SOFTENER) 100 MG capsule Take 1 capsule (100 mg total) by mouth daily.  90 capsule  0  . gabapentin (NEURONTIN) 300 MG capsule Take 1 capsule (300 mg total) by mouth 3 (three) times daily.  90 capsule  2  . insulin glargine (LANTUS SOLOSTAR) 100 UNIT/ML injection Inject 20 Units into the skin at bedtime.  10 mL  0  . lisinopril (PRINIVIL,ZESTRIL) 20 MG tablet Take 1 tablet (20 mg total) by mouth daily.  90 tablet  0  . oxyCODONE-acetaminophen (PERCOCET) 7.5-500 MG per tablet Take 1 tablet by mouth 3 (three) times daily as needed for pain.  90 tablet  0  . simvastatin (ZOCOR) 20 MG tablet Take 1 tablet (20 mg total) by mouth daily.  90 tablet  0  . DISCONTD: carvedilol (COREG) 12.5 MG tablet Take 1 tablet (12.5 mg total) by mouth 2 (two) times daily with a meal.  60 tablet  11  . acetaminophen (TYLENOL) 325 MG tablet Take 650 mg by mouth every 6 (six) hours as needed.        . fluticasone (FLONASE) 50 MCG/ACT nasal spray Place 2 sprays into the nose daily.  16 g  0   Family History  Problem Relation Age of Onset  .  Hyperlipidemia Brother   . Hypertension Brother   . Diabetes Brother    History   Social History  . Marital Status: Widowed    Spouse Name: N/A    Number of Children: N/A  . Years of Education: N/A   Social History Main Topics  . Smoking status: Former Smoker    Types: Cigarettes    Quit date: 02/17/2000  . Smokeless tobacco: Never Used  . Alcohol Use: No  . Drug Use: No  . Sexually Active: None   Other Topics Concern  . None   Social History Narrative   Patient requests to use [employer] [facility] for her diabets testing supplies as of 09/05/2009.   Review of Systems: General: no fevers, chills, changes in weight, changes in appetite Skin: no rash HEENT: no blurry vision, hearing changes, sore throat Pulm: no  dyspnea, coughing, wheezing CV: no chest pain, palpitations, shortness of breath Abd: no abdominal pain, nausea/vomiting, diarrhea/constipation GU: no dysuria, hematuria, polyuria Ext: no myalgias Neuro: no weakness, numbness, or tingling   Objective:  Physical Exam: Filed Vitals:   09/29/11 1445  BP: 138/68  Pulse: 81  Temp: 98.1 F (36.7 C)  TempSrc: Oral  Height: 5\' 4"  (1.626 m)  Weight: 206 lb 8 oz (93.668 kg)  SpO2: 97%   Constitutional: Vital signs reviewed.  Patient is a well-developed and well-nourished woman in no acute distress and cooperative with exam. Cardiovascular: RRR, S1 normal, S2 normal, no MRG, pulses symmetric and intact bilaterally Pulmonary/Chest: CTAB, no wheezes, rales, or rhonchi Abdominal: Soft. Non-tender, non-distended, bowel sounds are normal, no masses, organomegaly, or guarding present.  GU: no CVA tenderness Neurological: A&O x3 Skin: Warm, dry and intact. No rash, cyanosis, or clubbing.  Psychiatric: Normal mood and affect. speech and behavior is normal. Judgment and thought content normal. Cognition and memory are normal.   Assessment & Plan:   Case and care discussed with Dr. Eben Burow. Please see problem-oriented turning for further details. Patient to return in one month for blood pressure and heart failure followup.

## 2011-09-29 NOTE — Assessment & Plan Note (Signed)
Blood pressure significantly improved with the escalation of lisinopril to 20 mg daily. She is currently asymptomatic, and has tolerated the decrease in her blood pressure well. Her blood pressure is still mildly elevated (138/68), so increase her Coreg as explained in the assessment for congestive heart failure. Assuming the patient can tolerate further decreases in blood pressure heart rate, her ideal regimen may be Coreg 25 mg twice a day and lisinopril 40 mg daily. Given her age, we will have to strike this balance carefully  -For now, continue lisinopril 20 mg daily -Increase Coreg to 25 mg in the morning and 12.5 mg in the evening -Given normal renal function, and since patient has tolerated lisinopril 10 mg daily, I did not repeat bmet today, but may be repeated at next followup

## 2011-09-29 NOTE — Patient Instructions (Addendum)
-  Continue taking Lisinopril 20mg  daily - your blood pressure was much improved today.  -Increase Coreg to 25mg  in the morning (2 tablets) and 12.5 mg in the evening.  If you have symptoms such as dizziness, light headedness, fatigue, please call the clinic.  Please be sure to bring all of your medications with you to every visit.  Should you have any new or worsening symptoms, please be sure to call the clinic at [phone].

## 2011-09-29 NOTE — Assessment & Plan Note (Signed)
>>  ASSESSMENT AND PLAN FOR CHRONIC DIASTOLIC CONGESTIVE HEART FAILURE (HCC) WRITTEN ON 09/29/2011  3:46 PM BY SHARDA, NEEMA K, MD  Given history is significant systolic heart failure (EF 15-20% with mild left ventricular hypertrophy), patient's goal heart rate is 55-60. In order to maximize patient's heart failure regimen, and avoid hospitalization, I will increase her Coreg  to 25 mg in the morning and 12.5 mg in the evening. If she cannot tolerate this, she will call her clinic and let us  know. If her heart rate can further tolerate increased dosage, her goal will be Coreg  25 mg twice daily. She will return in one month for blood pressure and heart failure followup.

## 2011-10-02 ENCOUNTER — Telehealth: Payer: Self-pay | Admitting: *Deleted

## 2011-10-02 NOTE — Telephone Encounter (Signed)
[place] healthcare case manager calls and requests an evaluation and order for rollator walker, please send this referral to your nurse if approved.

## 2011-10-05 ENCOUNTER — Other Ambulatory Visit: Payer: Self-pay | Admitting: Internal Medicine

## 2011-10-08 ENCOUNTER — Other Ambulatory Visit: Payer: Self-pay | Admitting: Internal Medicine

## 2011-10-08 DIAGNOSIS — I5022 Chronic systolic (congestive) heart failure: Secondary | ICD-10-CM

## 2011-10-08 DIAGNOSIS — I639 Cerebral infarction, unspecified: Secondary | ICD-10-CM

## 2011-10-08 DIAGNOSIS — M25511 Pain in right shoulder: Secondary | ICD-10-CM

## 2011-10-21 ENCOUNTER — Other Ambulatory Visit: Payer: Self-pay | Admitting: Internal Medicine

## 2011-10-21 DIAGNOSIS — I10 Essential (primary) hypertension: Secondary | ICD-10-CM

## 2011-10-27 ENCOUNTER — Telehealth: Payer: Self-pay | Admitting: Internal Medicine

## 2011-10-27 NOTE — Telephone Encounter (Signed)
Patient's daughter called around 9:30 PM, 10/27/2011- concerning for short of breath and increased breathing rate since last night. Although patient afebrile, has no chest pain, nausea vomiting, fever, chills, abdominal pain, diarrhea. Considering patient's history of lung adenocarcinoma and severe systolic heart failure with EF 15-20% per echo in 2012- advised daughter to bring patient to ER. To this she wanted to wait overnight and call the clinic to be seen in clinic instead. I advised her to immediately call EMS if condition worsens. She verbalized understanding.

## 2011-10-28 ENCOUNTER — Encounter (HOSPITAL_COMMUNITY): Payer: Self-pay | Admitting: Emergency Medicine

## 2011-10-28 ENCOUNTER — Emergency Department (HOSPITAL_COMMUNITY)
Admission: EM | Admit: 2011-10-28 | Discharge: 2011-10-28 | Disposition: A | Discharge status: Home / Self Care | Payer: PRIVATE HEALTH INSURANCE | Attending: Emergency Medicine | Admitting: Emergency Medicine

## 2011-10-28 ENCOUNTER — Emergency Department (HOSPITAL_COMMUNITY): Payer: PRIVATE HEALTH INSURANCE

## 2011-10-28 DIAGNOSIS — Z7982 Long term (current) use of aspirin: Secondary | ICD-10-CM | POA: Insufficient documentation

## 2011-10-28 DIAGNOSIS — I509 Heart failure, unspecified: Secondary | ICD-10-CM | POA: Insufficient documentation

## 2011-10-28 DIAGNOSIS — Z8673 Personal history of transient ischemic attack (TIA), and cerebral infarction without residual deficits: Secondary | ICD-10-CM | POA: Insufficient documentation

## 2011-10-28 DIAGNOSIS — E785 Hyperlipidemia, unspecified: Secondary | ICD-10-CM | POA: Insufficient documentation

## 2011-10-28 DIAGNOSIS — K219 Gastro-esophageal reflux disease without esophagitis: Secondary | ICD-10-CM | POA: Insufficient documentation

## 2011-10-28 DIAGNOSIS — Z79899 Other long term (current) drug therapy: Secondary | ICD-10-CM | POA: Insufficient documentation

## 2011-10-28 DIAGNOSIS — I10 Essential (primary) hypertension: Secondary | ICD-10-CM | POA: Insufficient documentation

## 2011-10-28 DIAGNOSIS — E119 Type 2 diabetes mellitus without complications: Secondary | ICD-10-CM | POA: Insufficient documentation

## 2011-10-28 DIAGNOSIS — Z8739 Personal history of other diseases of the musculoskeletal system and connective tissue: Secondary | ICD-10-CM | POA: Insufficient documentation

## 2011-10-28 DIAGNOSIS — Z87891 Personal history of nicotine dependence: Secondary | ICD-10-CM | POA: Insufficient documentation

## 2011-10-28 DIAGNOSIS — J209 Acute bronchitis, unspecified: Secondary | ICD-10-CM | POA: Insufficient documentation

## 2011-10-28 LAB — POCT I-STAT TROPONIN I: Troponin i, poc: 0 ng/mL (ref 0.00–0.08)

## 2011-10-28 LAB — CBC
Hemoglobin: 9.4 g/dL — ABNORMAL LOW (ref 12.0–15.0)
MCH: 24.2 pg — ABNORMAL LOW (ref 26.0–34.0)
Platelets: 246 10*3/uL (ref 150–400)
RBC: 3.89 MIL/uL (ref 3.87–5.11)
WBC: 5 10*3/uL (ref 4.0–10.5)

## 2011-10-28 LAB — BASIC METABOLIC PANEL
CO2: 29 mEq/L (ref 19–32)
Calcium: 9.1 mg/dL (ref 8.4–10.5)
Chloride: 105 mEq/L (ref 96–112)
Glucose, Bld: 166 mg/dL — ABNORMAL HIGH (ref 70–99)
Potassium: 3.4 mEq/L — ABNORMAL LOW (ref 3.5–5.1)
Sodium: 141 mEq/L (ref 135–145)

## 2011-10-28 MED ORDER — AZITHROMYCIN 250 MG PO TABS
500.0000 mg | ORAL_TABLET | Freq: Once | ORAL | Status: AC
  Administered 2011-10-28: 500 mg via ORAL
  Filled 2011-10-28: qty 2

## 2011-10-28 MED ORDER — IPRATROPIUM BROMIDE 0.02 % IN SOLN
0.5000 mg | Freq: Once | RESPIRATORY_TRACT | Status: AC
  Administered 2011-10-28: 0.5 mg via RESPIRATORY_TRACT
  Filled 2011-10-28: qty 2.5

## 2011-10-28 MED ORDER — ALBUTEROL SULFATE (5 MG/ML) 0.5% IN NEBU
5.0000 mg | INHALATION_SOLUTION | Freq: Once | RESPIRATORY_TRACT | Status: AC
  Administered 2011-10-28: 5 mg via RESPIRATORY_TRACT
  Filled 2011-10-28: qty 1

## 2011-10-28 MED ORDER — AZITHROMYCIN 250 MG PO TABS: 250.0000 mg | ORAL_TABLET | Freq: Every day | ORAL | Status: AC

## 2011-10-28 MED ORDER — ALBUTEROL SULFATE HFA 108 (90 BASE) MCG/ACT IN AERS
2.0000 | INHALATION_SPRAY | Freq: Once | RESPIRATORY_TRACT | Status: AC
  Administered 2011-10-28: 2 via RESPIRATORY_TRACT
  Filled 2011-10-28: qty 6.7

## 2011-10-28 NOTE — ED Notes (Signed)
EKG completed and given to Dr. Wentz along with OLD ekg. 

## 2011-10-28 NOTE — ED Notes (Signed)
Pt states understanding of discharge instructions 

## 2011-10-28 NOTE — ED Notes (Signed)
Sent to ED by [facility] for 2 days of sob, wheezing, and productive cough with white sputum.  Speaking in complete sentences. Audible wheezing.

## 2011-10-29 NOTE — ED Provider Notes (Signed)
History     CSN: [id number]  Arrival date & time 10/28/11  1732   First MD Initiated Contact with Patient 10/28/11 2219      Chief Complaint  Patient presents with  . Shortness of Breath    (Consider location/radiation/quality/duration/timing/severity/associated sxs/prior treatment) HPI Comments: Patient presents here for eval of cough, congestion for the past two days.  Cough is productive of white sputum.  She was seen at [facility] and sent here for further evaluation.  Patient is a 78 y.o. female presenting with shortness of breath. The history is provided by the patient.  Shortness of Breath  The current episode started 2 days ago. The onset was sudden. The problem occurs continuously. The problem has been gradually worsening. The problem is moderate. Nothing relieves the symptoms. Nothing aggravates the symptoms. Associated symptoms include cough and shortness of breath. Pertinent negatives include no chest pain, no chest pressure and no fever.    Past Medical History  Diagnosis Date  . Diabetes mellitus 2007    HgA1C (02/20/2010) = 9.2, HgA1C (03/20/2009) = 12.1  . Hyperlipidemia   . Hypertension   . GERD (gastroesophageal reflux disease)   . CVA (cerebrovascular accident) 2006     right embolic stroke, no residual deficits  . Diverticulitis   . CVA (cerebral infarction) 7-yrs ago  . Arthritis   . Dysphagia   . CHF (congestive heart failure)   . DIABETES MELLITUS, TYPE II 12/04/2005  . HYPERLIPIDEMIA 12/04/2005  . HYPERTENSION 12/04/2005  . GERD 12/04/2005  . ARTHRITIS, KNEE 03/24/2006  . Lung mass 07/08/2010  . Meningioma 07/21/2010  . Hemangioma of liver 12/02/2010  . Depression 12/02/2010  . Adenocarcinoma of lung      Right upper lobe adenocarcinoma.     Past Surgical History  Procedure Date  . Abdominal hysterectomy   . Video bronchoscope. 12/29/2007    [place]  . Wide excision of left upper back mass.   . Extracapsular cataract extraction with intraocular    lens implantation.  . Right vats,right thoracotomy,right lower lobectomy with node dissection     Family History  Problem Relation Age of Onset  . Hyperlipidemia Brother   . Hypertension Brother   . Diabetes Brother     History  Substance Use Topics  . Smoking status: Former Smoker    Types: Cigarettes    Quit date: 02/17/2000  . Smokeless tobacco: Never Used  . Alcohol Use: No    OB History    Grav Para Term Preterm Abortions TAB SAB Ect Mult Living                  Review of Systems  Constitutional: Negative for fever.  Respiratory: Positive for cough and shortness of breath.   Cardiovascular: Negative for chest pain.  All other systems reviewed and are negative.    Allergies  Review of patient's allergies indicates no known allergies.  Home Medications   Current Outpatient Rx  Name Route Sig Dispense Refill  . ACETAMINOPHEN 325 MG PO TABS Oral Take 650 mg by mouth every 6 (six) hours as needed. For pain    . ASPIRIN 81 MG PO TABS Oral Take 81 mg by mouth daily.      Marland Kitchen CARVEDILOL 12.5 MG PO TABS  Take 25 mg (2 tablets) in the morning, at 12.5 mg (1 tablet) in the evening. 90 tablet 11  . CITALOPRAM HYDROBROMIDE 20 MG PO TABS  TAKE 1 TABLET (20 MG TOTAL) BY MOUTH DAILY. 30 tablet 0  .  DOCUSATE SODIUM 100 MG PO CAPS Oral Take 1 capsule (100 mg total) by mouth daily. 90 capsule 0  . INSULIN GLARGINE 100 UNIT/ML [place] SOLN Subcutaneous Inject 20 Units into the skin at bedtime. 10 mL 0  . LISINOPRIL 20 MG PO TABS  TAKE 1 TABLET DAILY. 90 tablet 0  . OXYCODONE-ACETAMINOPHEN 7.5-500 MG PO TABS Oral Take 1 tablet by mouth 3 (three) times daily as needed for pain. 90 tablet 0  . SIMVASTATIN 20 MG PO TABS Oral Take 1 tablet (20 mg total) by mouth daily. 90 tablet 0  . AZITHROMYCIN 250 MG PO TABS Oral Take 1 tablet (250 mg total) by mouth daily. 4 tablet 0    BP 181/80  Pulse 85  Temp 98.3 F (36.8 C) (Oral)  Resp 16  SpO2 100%  Physical Exam  Nursing note and vitals  reviewed. Constitutional: She is oriented to person, place, and time. She appears well-developed and well-nourished.  HENT:  Head: Normocephalic and atraumatic.  Neck: Normal range of motion. Neck supple.  Cardiovascular: Normal rate and regular rhythm.   No murmur heard. Pulmonary/Chest: Effort normal and breath sounds normal. No respiratory distress.  Abdominal: Soft. Bowel sounds are normal. She exhibits no distension. There is no tenderness.  Musculoskeletal: Normal range of motion. She exhibits no edema.  Neurological: She is alert and oriented to person, place, and time.  Skin: Skin is warm and dry.    ED Course  Procedures (including critical care time)  Labs Reviewed  CBC - Abnormal; Notable for the following:    Hemoglobin 9.4 (*)     HCT 30.6 (*)     MCH 24.2 (*)     RDW 16.3 (*)     All other components within normal limits  BASIC METABOLIC PANEL - Abnormal; Notable for the following:    Potassium 3.4 (*)     Glucose, Bld 166 (*)     GFR calc non Af Amer 86 (*)     All other components within normal limits  POCT I-STAT TROPONIN I  LAB REPORT - SCANNED   Dg Chest 2 View  10/28/2011  *RADIOLOGY REPORT*  Clinical Data: Shortness of breath  CHEST - 2 VIEW  Comparison: 07/20/2011 and 01/27/2011  Findings: Cardiomediastinal silhouette is stable.  Stable postoperative changes and volume loss right hemithorax.  Stable right middle lobe and mid lung scarring.  There is central mild vascular congestion.  No focal infiltrate. No convincing pulmonary edema.  IMPRESSION:  Stable postoperative changes and volume loss right hemithorax. Stable right middle lobe and mid lung scarring.  There is central mild vascular congestion.  No focal infiltrate. No convincing pulmonary edema.   Original Report Authenticated By: Natasha Mead, M.D.      1. Acute bronchitis      Date: 10/29/2011  Rate: 83  Rhythm: normal sinus rhythm  QRS Axis: left  Intervals: normal  ST/T Wave abnormalities:  normal  Conduction Disutrbances:none  Narrative Interpretation:   Old EKG Reviewed: unchanged    MDM  The patient presents with difficulty breathing and cough for the past two days.  The cardiac workup is unremarkable and the chest xray shows only post operative changes but no obvious infiltrate.  She was given a neb treatment and is feeling better.  At this point, I believe she is stable for discharge to home.  She will be treated for bronchitis with zmax and mdi.        Geoffery Lyons, MD 10/29/11 [phone]

## 2011-11-02 ENCOUNTER — Ambulatory Visit: Payer: PRIVATE HEALTH INSURANCE | Admitting: Internal Medicine

## 2011-11-03 ENCOUNTER — Ambulatory Visit (HOSPITAL_COMMUNITY)
Admission: RE | Admit: 2011-11-03 | Discharge: 2011-11-03 | Disposition: A | Discharge status: Home / Self Care | Payer: PRIVATE HEALTH INSURANCE | Source: Ambulatory Visit | Attending: Internal Medicine | Admitting: Internal Medicine

## 2011-11-03 ENCOUNTER — Ambulatory Visit (INDEPENDENT_AMBULATORY_CARE_PROVIDER_SITE_OTHER): Payer: PRIVATE HEALTH INSURANCE | Admitting: Internal Medicine

## 2011-11-03 VITALS — BP 170/76 | HR 78 | Temp 97.5°F | Wt 216.3 lb

## 2011-11-03 DIAGNOSIS — E119 Type 2 diabetes mellitus without complications: Secondary | ICD-10-CM

## 2011-11-03 DIAGNOSIS — R059 Cough, unspecified: Secondary | ICD-10-CM | POA: Insufficient documentation

## 2011-11-03 DIAGNOSIS — M25511 Pain in right shoulder: Secondary | ICD-10-CM

## 2011-11-03 DIAGNOSIS — R0602 Shortness of breath: Secondary | ICD-10-CM | POA: Insufficient documentation

## 2011-11-03 DIAGNOSIS — J90 Pleural effusion, not elsewhere classified: Secondary | ICD-10-CM | POA: Insufficient documentation

## 2011-11-03 DIAGNOSIS — E785 Hyperlipidemia, unspecified: Secondary | ICD-10-CM

## 2011-11-03 DIAGNOSIS — I10 Essential (primary) hypertension: Secondary | ICD-10-CM

## 2011-11-03 DIAGNOSIS — R05 Cough: Secondary | ICD-10-CM | POA: Insufficient documentation

## 2011-11-03 DIAGNOSIS — I517 Cardiomegaly: Secondary | ICD-10-CM | POA: Insufficient documentation

## 2011-11-03 LAB — COMPLETE METABOLIC PANEL WITH GFR
ALT: 24 U/L (ref 0–35)
AST: 13 U/L (ref 0–37)
Albumin: 3.4 g/dL — ABNORMAL LOW (ref 3.5–5.2)
Alkaline Phosphatase: 76 U/L (ref 39–117)
BUN: 15 mg/dL (ref 6–23)
Calcium: 8.9 mg/dL (ref 8.4–10.5)
Chloride: 105 mEq/L (ref 96–112)
Potassium: 3.5 mEq/L (ref 3.5–5.3)
Sodium: 143 mEq/L (ref 135–145)

## 2011-11-03 LAB — PRO B NATRIURETIC PEPTIDE: Pro B Natriuretic peptide (BNP): 684.8 pg/mL — ABNORMAL HIGH (ref <451)

## 2011-11-03 MED ORDER — IPRATROPIUM BROMIDE 0.02 % IN SOLN
0.5000 mg | Freq: Once | RESPIRATORY_TRACT | Status: AC
  Administered 2011-11-03: 0.5 mg via RESPIRATORY_TRACT

## 2011-11-03 MED ORDER — POTASSIUM CHLORIDE ER 10 MEQ PO TBCR: EXTENDED_RELEASE_TABLET | ORAL | Status: DC

## 2011-11-03 MED ORDER — FUROSEMIDE 20 MG PO TABS: 20.0000 mg | ORAL_TABLET | Freq: Every day | ORAL | Status: DC

## 2011-11-03 MED ORDER — OXYCODONE-ACETAMINOPHEN 7.5-500 MG PO TABS: 1.0000 | ORAL_TABLET | Freq: Three times a day (TID) | ORAL | Status: DC | PRN

## 2011-11-03 MED ORDER — ALBUTEROL SULFATE (2.5 MG/3ML) 0.083% IN NEBU: 2.5000 mg | INHALATION_SOLUTION | Freq: Four times a day (QID) | RESPIRATORY_TRACT | Status: DC | PRN

## 2011-11-03 MED ORDER — ALBUTEROL SULFATE (2.5 MG/3ML) 0.083% IN NEBU
2.5000 mg | INHALATION_SOLUTION | Freq: Once | RESPIRATORY_TRACT | Status: AC
  Administered 2011-11-03: 2.5 mg via RESPIRATORY_TRACT

## 2011-11-03 NOTE — Patient Instructions (Addendum)
Get stat labs and chest Xray Check daily weight Start Lasix 20mg  one tablet daily Take KDur (potassium supplement) 4 tablets daily until you return to office again Start albuterol inhaler 1-2 puffs every 4 hours as needed for wheezing and SOB Return to clinic in 2 days

## 2011-11-03 NOTE — Progress Notes (Signed)
HPI: 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   Left facial every morning since last week, no pain associated with swelling. No recent trauma or dental procedure.  No masses noted. No fever or chills.  SOB/cough: x 1 week in duration, went to the ED on 9/11 and they her home with azithromax and inhaler which she completed but she did not feel any better. Mostly Dry cough but occasional white sputum, no fever or chills.  She sleeps on 2 pillows.  No PND or orthopnea.  No recent sick contacts.  She noticed increased swelling in both of her legs.  She cannot even walk 1 block before feeling SOB.  No hx of asthma or COPD.  She is s/p right lung lobectomy and with the small size of the cancer, DR. Mohammed and family decided to observe and not proceed with adjuvant chemotherapy.    ROS: as per HPI  PE: General: alert, well-developed, and cooperative to examination.  Face: no facial swelling noted, no lymphadenopathy Mouth: unremarkable Lungs: normal respiratory effort, no accessory muscle use, coarse breath sounds, decreased breath sound on R>L, no crackles, and occasional expiratory wheezes. Heart: normal rate, regular rhythm, no murmur, no gallop, and no rub.  Abdomen: soft, non-tender, normal bowel sounds, no distention, no guarding, no rebound tenderness Msk: no joint swelling, no joint warmth, and no redness over joints.  Pulses: 2+ DP/PT pulses bilaterally Extremities:  +2 pitting edema on LE bilaterally Neurologic: nonfocal

## 2011-11-03 NOTE — Assessment & Plan Note (Signed)
Not well controlled, likely 2/2 to acute illness.  Her last office visit, BP was in the 130/70's.  Repeat BP today was 170/76. Continue Coreg 25mg  bid and Lisinopril 20mg 

## 2011-11-03 NOTE — Assessment & Plan Note (Addendum)
Differential dx include systolic CHF (EF 65%), or URI.  Her weight is up by 10 pounds since 09/2011 and she does have increased bilateral lower extremities pitting edema.  She continues to cough despite the 5 day course of Zpak. -Neb treatments in office -Get stat CXR: bilateral pleural effusion (likely 2/2 CHF), increasing left basilar airspace disease, likely reflecting  atelectasis  -labs: prob BNP, CMP -Start Lasix 20mg  po qd as patient is Lasix naive -K is 3.5; therefore, will add KDur daily and will repeat BMP on 9/19  -check daily weight  -No abx is indicated at this time as she does not have any leukocytosis or fever or focal infiltrates -Albuterol PRN, consider adding Advair & Spiriva in future as she probably has a component of COPD - former smoker -follow up in 2 days   Patient discussed and seen by Dr. Lonzo Cloud

## 2011-11-05 ENCOUNTER — Encounter: Payer: Self-pay | Admitting: Internal Medicine

## 2011-11-05 ENCOUNTER — Ambulatory Visit (INDEPENDENT_AMBULATORY_CARE_PROVIDER_SITE_OTHER): Payer: PRIVATE HEALTH INSURANCE | Admitting: Internal Medicine

## 2011-11-05 VITALS — BP 174/91 | HR 82 | Temp 97.4°F | Resp 20 | Ht 64.25 in | Wt 213.5 lb

## 2011-11-05 DIAGNOSIS — I10 Essential (primary) hypertension: Secondary | ICD-10-CM

## 2011-11-05 DIAGNOSIS — R0602 Shortness of breath: Secondary | ICD-10-CM

## 2011-11-05 DIAGNOSIS — I5022 Chronic systolic (congestive) heart failure: Secondary | ICD-10-CM

## 2011-11-05 DIAGNOSIS — E119 Type 2 diabetes mellitus without complications: Secondary | ICD-10-CM

## 2011-11-05 LAB — BASIC METABOLIC PANEL WITH GFR
CO2: 30 mEq/L (ref 19–32)
Chloride: 102 mEq/L (ref 96–112)
Creat: 0.58 mg/dL (ref 0.50–1.10)
Glucose, Bld: 207 mg/dL — ABNORMAL HIGH (ref 70–99)

## 2011-11-05 LAB — GLUCOSE, CAPILLARY: Glucose-Capillary: 201 mg/dL — ABNORMAL HIGH (ref 70–99)

## 2011-11-05 MED ORDER — CARVEDILOL 25 MG PO TABS: ORAL_TABLET | ORAL | Status: DC

## 2011-11-05 MED ORDER — ALBUTEROL SULFATE HFA 108 (90 BASE) MCG/ACT IN AERS: 2.0000 | INHALATION_SPRAY | RESPIRATORY_TRACT | Status: DC | PRN

## 2011-11-05 MED ORDER — PREDNISONE 50 MG PO TABS: ORAL_TABLET | ORAL | Status: DC

## 2011-11-05 MED ORDER — LISINOPRIL 40 MG PO TABS: 40.0000 mg | ORAL_TABLET | Freq: Every day | ORAL | Status: DC

## 2011-11-05 MED ORDER — LOSARTAN POTASSIUM 100 MG PO TABS: 100.0000 mg | ORAL_TABLET | Freq: Every day | ORAL | Status: DC

## 2011-11-05 MED ORDER — DOXYCYCLINE HYCLATE 100 MG PO TABS: 100.0000 mg | ORAL_TABLET | Freq: Two times a day (BID) | ORAL | Status: AC

## 2011-11-05 NOTE — Progress Notes (Signed)
HPI: 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  She came to the clinic on 9/17 and CHF exacerbation treatment was started with Lasix 20mg  qd along with potassium supplement.  She states that she feels a little bit better but still has SOB and mostly a dry cough but she also has white sputum production as well.  Her daughter notes that patient started to cough more after they increased the Lisinopril dose to 20mg .  She denies any fever, chills, or chest pain.  She has lost 3 pounds in 2 days.    ROS: as per HPI  PE:  General: alert, well-developed, and cooperative to examination.  Face: no facial swelling noted, no lymphadenopathy  Mouth: unremarkable Lungs: normal respiratory effort, no accessory muscle use, coarse breath sounds, decreased breath sound bilaterally, no crackles, and +++ expiratory and inspiratory wheezes diffusedly. Heart: normal rate, regular rhythm, no murmur, no gallop, and no rub.  Abdomen: soft, non-tender, normal bowel sounds, no distention, no guarding, no rebound tenderness Msk: no joint swelling, no joint warmth, and no redness over joints.  Pulses: 2+ DP/PT pulses bilaterally Extremities: +2 pitting edema on LE bilaterally but improve from 2 days ago Neurologic: nonfocal

## 2011-11-05 NOTE — Patient Instructions (Addendum)
Increase your Coreg to 25mg  twice per day Stop Lisinopril Start Losartan 100mg  one tablet daily Will get labs today and I will call you with any lab abnormalities Start taking Prednisone 50mg  one tablet daily x 7 days Start taking Doxycycline 100mg  one tablet twice daily x 7 days Follow up in 1 week if no improvement

## 2011-11-05 NOTE — Assessment & Plan Note (Signed)
Multifactorial: CHF exacerbation and ?COPD exacerbation (patient is a former smoker). CXR on 9/17 did not show any infiltrates but has bilateral pleural effusion and patient continues to have dry and purulent sputum production.  She is not hypoxic although her lung exam is very tight with diffused wheezing, worse than previously.  Her weight is down by 3 pounds and her LE edema are improving. -Will continue Lasix 20mg  qd -Daily weights -BMP today -Also start treatment for possible COPD exacerbation: Prednisone 50mg  qd x 7 days & Doxycycline 100mg  po bid x 7 days -Albuterol inhaler and nebs -I ordered nebulizer machine to be sent to patient's home -Follow up in 1 week

## 2011-11-05 NOTE — Assessment & Plan Note (Signed)
Not well controlled. SBP still in the 170's.   -Will increase Coreg to 25mg  BID -D/C lisinopril as it may be the reason why she has the dry cough -Start Losartan 100mg  qd -Continue Lasix 20mg  qd -Repeat BMP today to make sure her electrolytes are wnl -Repeat BP in 1 week

## 2011-11-06 ENCOUNTER — Telehealth: Payer: Self-pay | Admitting: *Deleted

## 2011-11-06 NOTE — Telephone Encounter (Signed)
Robyn calls for extended [facility] through next week, 2 visits to help pt adapt to new orders from hosp disch, ok? i gave verbal ok

## 2011-11-14 ENCOUNTER — Telehealth: Payer: Self-pay | Admitting: Internal Medicine

## 2011-11-14 ENCOUNTER — Encounter (HOSPITAL_COMMUNITY): Payer: Self-pay | Admitting: Emergency Medicine

## 2011-11-14 ENCOUNTER — Emergency Department (HOSPITAL_COMMUNITY)
Admission: EM | Admit: 2011-11-14 | Discharge: 2011-11-14 | Disposition: A | Discharge status: Home / Self Care | Payer: PRIVATE HEALTH INSURANCE | Attending: Emergency Medicine | Admitting: Emergency Medicine

## 2011-11-14 DIAGNOSIS — F3289 Other specified depressive episodes: Secondary | ICD-10-CM | POA: Insufficient documentation

## 2011-11-14 DIAGNOSIS — Z8673 Personal history of transient ischemic attack (TIA), and cerebral infarction without residual deficits: Secondary | ICD-10-CM | POA: Insufficient documentation

## 2011-11-14 DIAGNOSIS — T50905A Adverse effect of unspecified drugs, medicaments and biological substances, initial encounter: Secondary | ICD-10-CM

## 2011-11-14 DIAGNOSIS — Z87891 Personal history of nicotine dependence: Secondary | ICD-10-CM | POA: Insufficient documentation

## 2011-11-14 DIAGNOSIS — T50904A Poisoning by unspecified drugs, medicaments and biological substances, undetermined, initial encounter: Secondary | ICD-10-CM | POA: Insufficient documentation

## 2011-11-14 DIAGNOSIS — R739 Hyperglycemia, unspecified: Secondary | ICD-10-CM

## 2011-11-14 DIAGNOSIS — Z833 Family history of diabetes mellitus: Secondary | ICD-10-CM | POA: Insufficient documentation

## 2011-11-14 DIAGNOSIS — Z8489 Family history of other specified conditions: Secondary | ICD-10-CM | POA: Insufficient documentation

## 2011-11-14 DIAGNOSIS — Z8249 Family history of ischemic heart disease and other diseases of the circulatory system: Secondary | ICD-10-CM | POA: Insufficient documentation

## 2011-11-14 DIAGNOSIS — F329 Major depressive disorder, single episode, unspecified: Secondary | ICD-10-CM | POA: Insufficient documentation

## 2011-11-14 DIAGNOSIS — E119 Type 2 diabetes mellitus without complications: Secondary | ICD-10-CM | POA: Insufficient documentation

## 2011-11-14 DIAGNOSIS — K219 Gastro-esophageal reflux disease without esophagitis: Secondary | ICD-10-CM | POA: Insufficient documentation

## 2011-11-14 DIAGNOSIS — Z794 Long term (current) use of insulin: Secondary | ICD-10-CM | POA: Insufficient documentation

## 2011-11-14 DIAGNOSIS — I10 Essential (primary) hypertension: Secondary | ICD-10-CM | POA: Insufficient documentation

## 2011-11-14 LAB — CBC WITH DIFFERENTIAL/PLATELET
Hemoglobin: 11.4 g/dL — ABNORMAL LOW (ref 12.0–15.0)
Lymphocytes Relative: 17 % (ref 12–46)
Lymphs Abs: 1.9 10*3/uL (ref 0.7–4.0)
MCV: 77 fL — ABNORMAL LOW (ref 78.0–100.0)
Monocytes Relative: 9 % (ref 3–12)
Neutrophils Relative %: 74 % (ref 43–77)
Platelets: 389 10*3/uL (ref 150–400)
RBC: 4.7 MIL/uL (ref 3.87–5.11)
WBC: 10.8 10*3/uL — ABNORMAL HIGH (ref 4.0–10.5)

## 2011-11-14 LAB — BASIC METABOLIC PANEL
CO2: 25 mEq/L (ref 19–32)
Chloride: 99 mEq/L (ref 96–112)
Glucose, Bld: 366 mg/dL — ABNORMAL HIGH (ref 70–99)
Potassium: 4 mEq/L (ref 3.5–5.1)
Sodium: 136 mEq/L (ref 135–145)

## 2011-11-14 LAB — GLUCOSE, CAPILLARY: Glucose-Capillary: 358 mg/dL — ABNORMAL HIGH (ref 70–99)

## 2011-11-14 NOTE — ED Provider Notes (Signed)
History     CSN: [id number]  Arrival date & time 11/14/11  1344   First MD Initiated Contact with Patient 11/14/11 1708      Chief Complaint  Patient presents with  . Hyperglycemia    (Consider location/radiation/quality/duration/timing/severity/associated sxs/prior treatment) Patient is a 78 y.o. female presenting with diabetes problem. The history is provided by the patient.  Diabetes She has type 2 diabetes mellitus. Her disease course has been stable. Pertinent negatives for hypoglycemia include no confusion or headaches. Pertinent negatives for diabetes include no chest pain. (She has been taking blood sugar at home as per her usual and has seen an upward trend over the last 3 days. No N, V.  She has not been ill, not had a fever, denies cough or dysuria. Her appetite remains normal and compliant with diabetic diet. No change in normal activity. She does report she was treated with an antibiotic and prednisone last week for bronchitis that she finished taking yesterday. Symptoms of bronchitis have resolved.) She is compliant with treatment all of the time. Her weight is stable. She is following a diabetic diet. Her home blood glucose trend is increasing steadily.    Past Medical History  Diagnosis Date  . Diabetes mellitus 2007    HgA1C (02/20/2010) = 9.2, HgA1C (03/20/2009) = 12.1  . Hyperlipidemia   . Hypertension   . GERD (gastroesophageal reflux disease)   . CVA (cerebrovascular accident) 2006     right embolic stroke, no residual deficits  . Diverticulitis   . CVA (cerebral infarction) 7-yrs ago  . Arthritis   . Dysphagia   . CHF (congestive heart failure)   . DIABETES MELLITUS, TYPE II 12/04/2005  . HYPERLIPIDEMIA 12/04/2005  . HYPERTENSION 12/04/2005  . GERD 12/04/2005  . ARTHRITIS, KNEE 03/24/2006  . Lung mass 07/08/2010  . Meningioma 07/21/2010  . Hemangioma of liver 12/02/2010  . Depression 12/02/2010  . Adenocarcinoma of lung      Right upper lobe  adenocarcinoma.     Past Surgical History  Procedure Date  . Abdominal hysterectomy   . Video bronchoscope. 12/29/2007    [place]  . Wide excision of left upper back mass.   . Extracapsular cataract extraction with intraocular     lens implantation.  . Right vats,right thoracotomy,right lower lobectomy with node dissection     Family History  Problem Relation Age of Onset  . Hyperlipidemia Brother   . Hypertension Brother   . Diabetes Brother     History  Substance Use Topics  . Smoking status: Former Smoker    Types: Cigarettes    Quit date: 02/17/2000  . Smokeless tobacco: Never Used  . Alcohol Use: No    OB History    Grav Para Term Preterm Abortions TAB SAB Ect Mult Living                  Review of Systems  Constitutional: Negative for fever, activity change and appetite change.  HENT: Negative for congestion and sore throat.   Eyes: Negative for visual disturbance.  Respiratory: Negative for cough and shortness of breath.   Cardiovascular: Negative for chest pain.  Gastrointestinal: Negative for nausea, vomiting and abdominal pain.  Genitourinary: Negative for dysuria.  Musculoskeletal: Negative for back pain.  Skin: Negative for rash.  Neurological: Negative for light-headedness and headaches.  Psychiatric/Behavioral: Negative for confusion.    Allergies  Review of patient's allergies indicates no known allergies.  Home Medications   Current Outpatient Rx  Name Route  Sig Dispense Refill  . ALBUTEROL SULFATE HFA 108 (90 BASE) MCG/ACT IN AERS Inhalation Inhale 2 puffs into the lungs every 4 (four) hours as needed for wheezing. 1 Inhaler 3  . ALBUTEROL SULFATE (2.5 MG/3ML) 0.083% IN NEBU Nebulization Take 3 mLs (2.5 mg total) by nebulization every 6 (six) hours as needed for wheezing. 75 mL 12  . ASPIRIN 81 MG PO TABS Oral Take 81 mg by mouth daily.      Marland Kitchen CARVEDILOL 25 MG PO TABS Oral Take 25 mg by mouth 2 (two) times daily with a meal.    . CITALOPRAM  HYDROBROMIDE 20 MG PO TABS Oral Take 20 mg by mouth daily.    Marland Kitchen DOCUSATE SODIUM 100 MG PO CAPS Oral Take 1 capsule (100 mg total) by mouth daily. 90 capsule 0  . DOXYCYCLINE HYCLATE 100 MG PO TABS Oral Take 1 tablet (100 mg total) by mouth 2 (two) times daily. 14 tablet 0  . FUROSEMIDE 20 MG PO TABS Oral Take 1 tablet (20 mg total) by mouth daily. 30 tablet 0  . INSULIN GLARGINE 100 UNIT/ML [place] SOLN Subcutaneous Inject 20 Units into the skin at bedtime. 10 mL 0  . LOSARTAN POTASSIUM 100 MG PO TABS Oral Take 1 tablet (100 mg total) by mouth daily. 30 tablet 11  . OXYCODONE-ACETAMINOPHEN 7.5-500 MG PO TABS Oral Take 1 tablet by mouth 3 (three) times daily as needed for pain. 90 tablet 0  . POTASSIUM CHLORIDE ER 10 MEQ PO TBCR  Take 4 tablets daily 30 tablet 0  . SIMVASTATIN 20 MG PO TABS Oral Take 1 tablet (20 mg total) by mouth daily. 90 tablet 0  . PREDNISONE 50 MG PO TABS  Take one tablet daily x 5 days 7 tablet 0    BP 145/53  Pulse 78  Temp 98.2 F (36.8 C) (Oral)  Resp 18  SpO2 100%  Physical Exam  Constitutional: She is oriented to person, place, and time. She appears well-developed and well-nourished.  HENT:  Head: Normocephalic.  Neck: Normal range of motion. Neck supple.  Cardiovascular: Normal rate and regular rhythm.   Pulmonary/Chest: Effort normal and breath sounds normal.  Abdominal: Soft. Bowel sounds are normal. There is no tenderness. There is no rebound and no guarding.  Musculoskeletal: Normal range of motion.  Neurological: She is alert and oriented to person, place, and time.  Skin: Skin is warm and dry. No rash noted.  Psychiatric: She has a normal mood and affect.    ED Course  Procedures (including critical care time)  Labs Reviewed  GLUCOSE, CAPILLARY - Abnormal; Notable for the following:    Glucose-Capillary 358 (*)     All other components within normal limits  CBC WITH DIFFERENTIAL - Abnormal; Notable for the following:    WBC 10.8 (*)      Hemoglobin 11.4 (*)     MCV 77.0 (*)     MCH 24.3 (*)     RDW 15.6 (*)     Neutro Abs 8.0 (*)     All other components within normal limits  BASIC METABOLIC PANEL - Abnormal; Notable for the following:    Glucose, Bld 366 (*)     GFR calc non Af Amer 86 (*)     All other components within normal limits  URINALYSIS, ROUTINE W REFLEX MICROSCOPIC   No results found.   No diagnosis found. 1. Hyperglycemia    MDM  Suspect the increase in blood glucose correlates with use of  prednisone. No symptoms to suggest DKA or underlying illness.       Rodena Medin, PA-C 11/14/11 1751

## 2011-11-14 NOTE — ED Notes (Signed)
Pt reports checked her blood sugar and it was 471 this morning. Pt denies any symptoms at present.

## 2011-11-14 NOTE — ED Provider Notes (Signed)
Medical screening examination/treatment/procedure(s) were performed by non-physician practitioner and as supervising physician I was immediately available for consultation/collaboration.   Finn Amos B. Bernette Mayers, MD 11/14/11 1753

## 2011-11-14 NOTE — Telephone Encounter (Signed)
Received a phone call from patient and her daughter.  Patient states that she has been feeling fatigue and lack of energy x 1 week and noticed some dysuria and urinary frequency/urgency x 3 days. She reports that her Blood glucose has been elevated for past 2-3 days while she is very compliant with her daily Lantus 20 units QHS. She reports that high CBG is very unusual to her. She usually runs at 200's and noticed her CBG was 456 yesterday and 471 today.  Denies fever or chills.  Given above phone conversation, I think that she will need to go to ER for evaluation of Hyperglycemia 2/2 possible Urinary tract infection. I discussed with plan with patient and her daughter. Her daughter will take her to ED now.

## 2011-11-30 ENCOUNTER — Other Ambulatory Visit: Payer: Self-pay | Admitting: Internal Medicine

## 2011-12-15 ENCOUNTER — Other Ambulatory Visit: Payer: Self-pay | Admitting: *Deleted

## 2011-12-15 DIAGNOSIS — E119 Type 2 diabetes mellitus without complications: Secondary | ICD-10-CM

## 2011-12-15 DIAGNOSIS — E785 Hyperlipidemia, unspecified: Secondary | ICD-10-CM

## 2011-12-15 DIAGNOSIS — I10 Essential (primary) hypertension: Secondary | ICD-10-CM

## 2011-12-15 DIAGNOSIS — M25511 Pain in right shoulder: Secondary | ICD-10-CM

## 2011-12-21 MED ORDER — OXYCODONE-ACETAMINOPHEN 7.5-500 MG PO TABS: 1.0000 | ORAL_TABLET | Freq: Three times a day (TID) | ORAL | Status: DC | PRN

## 2011-12-24 ENCOUNTER — Encounter: Payer: Self-pay | Admitting: Internal Medicine

## 2011-12-24 NOTE — Progress Notes (Signed)
Patient ID: [name], female   DOB: [birth year], 78 y.o.   MRN: [id number]  This patient is a CHRONIC NO-SHOW PATIENT that has a history of HYPERTENSION.  Please make sure to address hypertension during her next clinic appointment, and intervene as appropriate.    Within the AVS, please incorporate the following smartphrase: .htntips   Pertinent Data: BP Readings from Last 3 Encounters:  11/14/11 145/53  11/05/11 174/91  11/03/11 170/76    BMI: Estimated Body mass index is 36.36 kg/(m^2) as calculated from the following:   Height as of 11/05/11: 5' 4.25"(1.632 m).   Weight as of 11/05/11: 213 lb 8 oz(96.843 kg).  Smoking History: History  Smoking status  . Former Smoker  . Types: Cigarettes  . Quit date: 02/17/2000  Smokeless tobacco  . Never Used    Last Basic Metabolic Panel:    Component Value Date/Time   Kyler Lerette 136 11/14/2011 1445   Shanta Dorvil 146* 07/20/2011 1527   K 4.0 11/14/2011 1445   K 4.2 07/20/2011 1527   CL 99 11/14/2011 1445   CL 105 07/20/2011 1527   CO2 25 11/14/2011 1445   CO2 30 07/20/2011 1527   BUN 22 11/14/2011 1445   BUN 14 07/20/2011 1527   CREATININE 0.60 11/14/2011 1445   CREATININE 0.58 11/05/2011 1240   GLUCOSE 366* 11/14/2011 1445   GLUCOSE 129* 07/20/2011 1527   CALCIUM 9.4 11/14/2011 1445   CALCIUM 8.6 07/20/2011 1527    Allergies: No Known Allergies

## 2012-01-19 ENCOUNTER — Telehealth: Payer: Self-pay | Admitting: Internal Medicine

## 2012-01-19 ENCOUNTER — Ambulatory Visit (HOSPITAL_COMMUNITY): Payer: PRIVATE HEALTH INSURANCE

## 2012-01-19 ENCOUNTER — Other Ambulatory Visit: Payer: PRIVATE HEALTH INSURANCE | Admitting: Lab

## 2012-01-19 NOTE — Telephone Encounter (Signed)
daughter called to cx todays aptts and r/s all due to having dia,r/s all appt to next week     anne

## 2012-01-21 ENCOUNTER — Ambulatory Visit: Payer: PRIVATE HEALTH INSURANCE | Admitting: Internal Medicine

## 2012-01-26 ENCOUNTER — Ambulatory Visit (HOSPITAL_COMMUNITY): Payer: PRIVATE HEALTH INSURANCE

## 2012-01-26 ENCOUNTER — Telehealth: Payer: Self-pay | Admitting: *Deleted

## 2012-01-26 ENCOUNTER — Other Ambulatory Visit: Payer: Self-pay | Admitting: Medical Oncology

## 2012-01-26 ENCOUNTER — Other Ambulatory Visit: Payer: PRIVATE HEALTH INSURANCE

## 2012-01-26 NOTE — Telephone Encounter (Signed)
Patient's daughter confirmed over the phone the new date and time on 02-04-2012 at 10:45am

## 2012-01-26 NOTE — Telephone Encounter (Signed)
Daughter confirmed over the phone the new time and date for the lab appointment before the scan on 02-02-2012

## 2012-01-28 ENCOUNTER — Ambulatory Visit: Payer: PRIVATE HEALTH INSURANCE | Admitting: Internal Medicine

## 2012-02-02 ENCOUNTER — Ambulatory Visit (HOSPITAL_COMMUNITY): Admission: RE | Admit: 2012-02-02 | Payer: PRIVATE HEALTH INSURANCE | Source: Ambulatory Visit

## 2012-02-02 ENCOUNTER — Other Ambulatory Visit: Payer: PRIVATE HEALTH INSURANCE

## 2012-02-04 ENCOUNTER — Ambulatory Visit: Payer: PRIVATE HEALTH INSURANCE | Admitting: Internal Medicine

## 2012-02-08 ENCOUNTER — Other Ambulatory Visit: Payer: Self-pay | Admitting: *Deleted

## 2012-02-08 DIAGNOSIS — E119 Type 2 diabetes mellitus without complications: Secondary | ICD-10-CM

## 2012-02-08 DIAGNOSIS — I10 Essential (primary) hypertension: Secondary | ICD-10-CM

## 2012-02-08 DIAGNOSIS — M25511 Pain in right shoulder: Secondary | ICD-10-CM

## 2012-02-08 DIAGNOSIS — E785 Hyperlipidemia, unspecified: Secondary | ICD-10-CM

## 2012-02-08 MED ORDER — OXYCODONE-ACETAMINOPHEN 7.5-500 MG PO TABS: 1.0000 | ORAL_TABLET | Freq: Three times a day (TID) | ORAL | Status: DC | PRN

## 2012-02-08 NOTE — Telephone Encounter (Signed)
Refill printed and signed - nurse to complete. 

## 2012-02-08 NOTE — Telephone Encounter (Signed)
Last refill ? 10/29 Call when ready @ [phone]

## 2012-02-08 NOTE — Telephone Encounter (Signed)
Pt informed Rx is ready 

## 2012-02-17 ENCOUNTER — Telehealth: Payer: Self-pay | Admitting: Internal Medicine

## 2012-02-17 NOTE — Telephone Encounter (Signed)
[name]'s daughter called at about 2:40 AM, reporting that her mother had a fall. Her daughter said that it seems that patient was not injured, but patient has been coughing. She is taking Robitussin without improvement. When I tried to ask questions in detail, such as whether patient has chest pain and shortness, her daughter said "I do not know what is going on". I asked her to give the phone to her mother so I could directly talk to patient. [name] said that she did not have a fall.  Her only problem is coughing. She denies having chest pain, SOB, fever or chills. Her mental status seems to be normal since she knows today is new year day and who is current president. I asked her daughter to take patient to hospital for better evaluation, but patient refused. Her daughter would likely to watch patient first and will take her to hospital if her condition gets worse.   Lorretta Harp, MD PGY2, Internal Medicine Teaching Service Pager: [phone]

## 2012-03-14 ENCOUNTER — Other Ambulatory Visit: Payer: Self-pay | Admitting: Internal Medicine

## 2012-04-29 ENCOUNTER — Encounter: Payer: Self-pay | Admitting: Internal Medicine

## 2012-04-29 ENCOUNTER — Ambulatory Visit: Payer: PRIVATE HEALTH INSURANCE | Admitting: Internal Medicine

## 2012-04-29 ENCOUNTER — Ambulatory Visit (HOSPITAL_COMMUNITY)
Admission: RE | Admit: 2012-04-29 | Discharge: 2012-04-29 | Disposition: A | Discharge status: Home / Self Care | Payer: PRIVATE HEALTH INSURANCE | Source: Ambulatory Visit | Attending: Internal Medicine | Admitting: Internal Medicine

## 2012-04-29 ENCOUNTER — Ambulatory Visit (INDEPENDENT_AMBULATORY_CARE_PROVIDER_SITE_OTHER): Payer: PRIVATE HEALTH INSURANCE | Admitting: Internal Medicine

## 2012-04-29 VITALS — BP 149/71 | HR 76 | Temp 98.6°F | Ht 60.0 in | Wt 205.4 lb

## 2012-04-29 DIAGNOSIS — I517 Cardiomegaly: Secondary | ICD-10-CM | POA: Insufficient documentation

## 2012-04-29 DIAGNOSIS — Z23 Encounter for immunization: Secondary | ICD-10-CM | POA: Insufficient documentation

## 2012-04-29 DIAGNOSIS — R0602 Shortness of breath: Secondary | ICD-10-CM

## 2012-04-29 DIAGNOSIS — R059 Cough, unspecified: Secondary | ICD-10-CM | POA: Insufficient documentation

## 2012-04-29 DIAGNOSIS — R053 Chronic cough: Secondary | ICD-10-CM | POA: Insufficient documentation

## 2012-04-29 LAB — GLUCOSE, CAPILLARY: Glucose-Capillary: 209 mg/dL — ABNORMAL HIGH (ref 70–99)

## 2012-04-29 MED ORDER — CLINDAMYCIN HCL 300 MG PO CAPS: 300.0000 mg | ORAL_CAPSULE | Freq: Three times a day (TID) | ORAL | Status: DC

## 2012-04-29 MED ORDER — METFORMIN HCL 1000 MG PO TABS: 1000.0000 mg | ORAL_TABLET | Freq: Two times a day (BID) | ORAL | Status: DC

## 2012-04-29 NOTE — Progress Notes (Signed)
Subjective:   Patient ID: [name] female   DOB: 04-15-1933 79 y.o.   MRN: [id number]  HPI: [name] is a 79 y.o. female with past medical history significant as outlined below who presented to the clinic for her red lesion on her chest.Patient's main concern today is a lesion on her left breast .She noted it started one month ago and now it is becoming bigger in size and more painful. She denies any insect bite, drainage , fevers or chills. She never had this before.    Patient is a chronic no-show.  Patient is a chronic no show. Her daughter is present during this office visit. She did not bring her medication with her but she may be taking Cozaar and Lisinopril  and lasix . When asked about Lantus patient reports that she is not sure how much she is taking 20. 40 or 60 . The daughter is not sure either.   I called the pharmacy and confirmed that the following medication were refilled on 1/27: 1. Lisinopril 2. Cozaar 3. Lasix 4. Simvastatin 5. Percocet 6. Celexa  7. Coreg ( no refill)   Kdur last refilled in 10/2011   Patient noted that she has been using the walker more instead of the cane since she was feeling weaker in her legs.  She denies any recent falls. She saw an Ophthalmologist last year and had new glasses prescribed.  Reports her hearing is getting worse. Denies any headache, back pain.   Patient further noted that she continues to have an unproductive cough.  Noted mild SOB , DOE but denies fevers, nasal congestion, chest pain, sick contact, recent travel . Patient was evaluated in 10/2011. Lisinopril was d/c and patient should have been  started on Cozaar. Chest xray was obtained which showed slight increase in bilateral pleural effusion.   Past Medical History  Diagnosis Date  . Diabetes mellitus 2007    HgA1C (02/20/2010) = 9.2, HgA1C (03/20/2009) = 12.1  . Hyperlipidemia   . Hypertension   . GERD (gastroesophageal reflux disease)   . CVA  (cerebrovascular accident) 2006     right embolic stroke, no residual deficits  . Diverticulitis   . CVA (cerebral infarction) 7-yrs ago  . Arthritis   . Dysphagia   . CHF (congestive heart failure)   . DIABETES MELLITUS, TYPE II 12/04/2005  . HYPERLIPIDEMIA 12/04/2005  . HYPERTENSION 12/04/2005  . GERD 12/04/2005  . ARTHRITIS, KNEE 03/24/2006  . Lung mass 07/08/2010  . Meningioma 07/21/2010  . Hemangioma of liver 12/02/2010  . Depression 12/02/2010  . Adenocarcinoma of lung      Right upper lobe adenocarcinoma.    Current Outpatient Prescriptions  Medication Sig Dispense Refill  . albuterol (PROVENTIL HFA;VENTOLIN HFA) 108 (90 BASE) MCG/ACT inhaler Inhale 2 puffs into the lungs every 4 (four) hours as needed for wheezing.  1 Inhaler  3  . albuterol (PROVENTIL) (2.5 MG/3ML) 0.083% nebulizer solution Take 3 mLs (2.5 mg total) by nebulization every 6 (six) hours as needed for wheezing.  75 mL  12  . aspirin 81 MG tablet Take 81 mg by mouth daily.        . carvedilol (COREG) 25 MG tablet Take 25 mg by mouth 2 (two) times daily with a meal.      . citalopram (CELEXA) 20 MG tablet TAKE 1 TABLET (20 MG TOTAL) BY MOUTH DAILY.  30 tablet  1  . CVS STOOL SOFTENER 100 MG capsule TAKE 1 CAPSULE BY  MOUTH DAILY.  90 capsule  0  . CVS STOOL SOFTENER 100 MG capsule TAKE 1 CAPSULE BY MOUTH DAILY.  90 capsule  0  . furosemide (LASIX) 20 MG tablet TAKE 1 TABLET (20 MG TOTAL) BY MOUTH DAILY.  30 tablet  0  . insulin glargine (LANTUS SOLOSTAR) 100 UNIT/ML injection Inject 20 - 40 units  Units into the skin at bedtime.  10 mL  0  . losartan (COZAAR) 100 MG tablet Take 1 tablet (100 mg total) by mouth daily.  30 tablet  11  . oxyCODONE-acetaminophen (PERCOCET) 7.5-500 MG per tablet Take 1 tablet by mouth 3 (three) times daily as needed for pain.  90 tablet  0  . Lisinopril 40 mg  Take 1 tablet by mouth daily  30 tablet  0  . predniSONE (DELTASONE) 50 MG tablet Take one tablet daily x 5 days  7 tablet  0  .  simvastatin (ZOCOR) 20 MG tablet TAKE 1 TABLET EVERY DAY  90 tablet  0   No current facility-administered medications for this visit.   Family History  Problem Relation Age of Onset  . Hyperlipidemia Brother   . Hypertension Brother   . Diabetes Brother    History   Social History  . Marital Status: Widowed    Spouse Name: N/A    Number of Children: N/A  . Years of Education: N/A   Social History Main Topics  . Smoking status: Former Smoker    Types: Cigarettes    Quit date: 02/17/2000  . Smokeless tobacco: Never Used  . Alcohol Use: No  . Drug Use: No  . Sexually Active: None   Other Topics Concern  . None   Social History Narrative   Patient requests to use [employer] for her diabets testing supplies as of 09/05/2009.   Review of Systems: Constitutional: Denies fever, chills, diaphoresis, appetite change and fatigue.   Respiratory: Noted  SOB, DOE, cough, chest tightness,  and wheezing    Cardiovascular: Denies chest pain, palpitations and leg swelling.  Gastrointestinal: Denies nausea, vomiting, abdominal pain, diarrhea, constipation, blood in stool and abdominal distention.  Musculoskeletal: Denies back pain,  Skin: noted itchiness on the back  Neurological:Noted some leg weakness   Objective:  Physical Exam: Filed Vitals:   04/29/12 1457  BP: 149/71  Pulse: 76  Temp: 98.6 F (37 C)  TempSrc: Oral  Height: 5' (1.524 m)  Weight: 205 lb 6.4 oz (93.169 kg)  SpO2: 96%   Constitutional: Vital signs reviewed.  Patient is a well-developed and well-nourished female in no acute distress and cooperative with exam. Alert and oriented x3.  Neck: Supple,  Cardiovascular: RRR, S1 normal, S2 normal, no MRG, pulses symmetric and intact bilaterally Breast:  in the Right upper quadrant of left breast near the sternum : and area of erythema notable, tender and warm to touch. In the center was a nodule palpable, non-fluctuant, no drainage present.   Pulmonary/Chest: decreased air movement at the right Abdominal: Soft. Non-tender, non-distended, bowel sounds are normal, no masses, organomegaly, or guarding present.  Hematology: no cervical adenopathy.  Neurological: A&O x3, Strength is normal and symmetric bilaterally, no focal motor deficit, sensory intact to light touch bilaterally.  Skin: excoriation present on the back notable. Decreased skin togure

## 2012-04-29 NOTE — Patient Instructions (Addendum)
Stop taking: 1. Insulin 2. Furosamide  3. Potassium 4. Losartan   Start:  1. Metformin 1000 mg twice a day for diabetes 2. Clindamycin ( antibiotics  for breast area )  Please bring all your medication with you during the next office visit

## 2012-04-29 NOTE — Assessment & Plan Note (Signed)
Lesion on left breast likely cellulitis. I will start clindamycin. If no improvement patient needs to get further evaluation. Last Mammogram was in 2012 ( report not present)

## 2012-04-29 NOTE — Assessment & Plan Note (Signed)
DM is uncontrolled likely due to medical noncompliance. Although Hgb A1c today is 10.9 I will d/c Lantus due to concern of inappropriate use by patient and possible risk of Hypoglycemia. I will start patient on Metformin and patient needs to have a repeat bmet during next office visit

## 2012-04-29 NOTE — Assessment & Plan Note (Signed)
Flu vaccination given today  

## 2012-04-29 NOTE — Assessment & Plan Note (Signed)
Patient did not follow up with Oncology . We will schedule appointment

## 2012-04-29 NOTE — Assessment & Plan Note (Addendum)
Lisinopril was d/c in 10/2011 by MD thinking this could be the cause of her chronic cough but patient may have taken Cozaar and Lisinopril.  Since patient history of DM I will d/c Cozaar and continue Lisinopril. Furthermore I will d/c Lasix and potassium. Patient seemed dry on physical exam . I will obtain a Bmet today.

## 2012-04-29 NOTE — Assessment & Plan Note (Signed)
Persistent Xray in 10/2011 was notable for slight increase in bilateral pleural effusion.  Lisinopril should have been d/c which the patient did not.  I doubt it is due ACEi. My believe it more related to history of Lung CA s/p resection. I will repeat Cxray for possible worsening of pleural effusion or any new process. Patient needs to go back to Oncology

## 2012-04-30 LAB — COMPREHENSIVE METABOLIC PANEL
BUN: 18 mg/dL (ref 6–23)
CO2: 28 mEq/L (ref 19–32)
Calcium: 9.2 mg/dL (ref 8.4–10.5)
Creat: 0.69 mg/dL (ref 0.50–1.10)
Glucose, Bld: 237 mg/dL — ABNORMAL HIGH (ref 70–99)
Total Bilirubin: 0.3 mg/dL (ref 0.3–1.2)

## 2012-04-30 MED ORDER — CARVEDILOL 25 MG PO TABS: 25.0000 mg | ORAL_TABLET | Freq: Two times a day (BID) | ORAL | Status: DC

## 2012-04-30 NOTE — Assessment & Plan Note (Signed)
Patient has hight risk of fall including vision trouble, hearing trouble, leg weakness, use of walker, DM, HTN. I will refer patient to PT for balance and fall evaluation

## 2012-05-06 ENCOUNTER — Ambulatory Visit (INDEPENDENT_AMBULATORY_CARE_PROVIDER_SITE_OTHER): Payer: PRIVATE HEALTH INSURANCE | Admitting: Internal Medicine

## 2012-05-06 ENCOUNTER — Encounter: Payer: Self-pay | Admitting: Internal Medicine

## 2012-05-06 ENCOUNTER — Inpatient Hospital Stay (HOSPITAL_COMMUNITY): Payer: PRIVATE HEALTH INSURANCE

## 2012-05-06 ENCOUNTER — Encounter (HOSPITAL_COMMUNITY): Payer: Self-pay | Admitting: Surgery

## 2012-05-06 ENCOUNTER — Inpatient Hospital Stay (HOSPITAL_COMMUNITY)
Admission: AD | Admit: 2012-05-06 | Discharge: 2012-05-08 | DRG: 600 | Disposition: A | Discharge status: Home / Self Care | Payer: PRIVATE HEALTH INSURANCE | Source: Ambulatory Visit | Attending: Internal Medicine | Admitting: Internal Medicine

## 2012-05-06 VITALS — BP 158/82 | HR 80 | Temp 98.6°F | Ht 64.0 in | Wt 202.6 lb

## 2012-05-06 DIAGNOSIS — I5020 Unspecified systolic (congestive) heart failure: Secondary | ICD-10-CM | POA: Insufficient documentation

## 2012-05-06 DIAGNOSIS — N611 Abscess of the breast and nipple: Secondary | ICD-10-CM | POA: Diagnosis present

## 2012-05-06 DIAGNOSIS — Z8673 Personal history of transient ischemic attack (TIA), and cerebral infarction without residual deficits: Secondary | ICD-10-CM

## 2012-05-06 DIAGNOSIS — E785 Hyperlipidemia, unspecified: Secondary | ICD-10-CM | POA: Diagnosis present

## 2012-05-06 DIAGNOSIS — I10 Essential (primary) hypertension: Secondary | ICD-10-CM

## 2012-05-06 DIAGNOSIS — E1140 Type 2 diabetes mellitus with diabetic neuropathy, unspecified: Secondary | ICD-10-CM | POA: Diagnosis present

## 2012-05-06 DIAGNOSIS — N61 Mastitis without abscess: Principal | ICD-10-CM | POA: Diagnosis present

## 2012-05-06 DIAGNOSIS — I509 Heart failure, unspecified: Secondary | ICD-10-CM | POA: Diagnosis present

## 2012-05-06 DIAGNOSIS — I152 Hypertension secondary to endocrine disorders: Secondary | ICD-10-CM | POA: Diagnosis present

## 2012-05-06 DIAGNOSIS — I5022 Chronic systolic (congestive) heart failure: Secondary | ICD-10-CM

## 2012-05-06 DIAGNOSIS — M1710 Unilateral primary osteoarthritis, unspecified knee: Secondary | ICD-10-CM | POA: Diagnosis present

## 2012-05-06 DIAGNOSIS — Z87891 Personal history of nicotine dependence: Secondary | ICD-10-CM

## 2012-05-06 DIAGNOSIS — Z85118 Personal history of other malignant neoplasm of bronchus and lung: Secondary | ICD-10-CM

## 2012-05-06 DIAGNOSIS — K219 Gastro-esophageal reflux disease without esophagitis: Secondary | ICD-10-CM | POA: Diagnosis present

## 2012-05-06 DIAGNOSIS — E119 Type 2 diabetes mellitus without complications: Secondary | ICD-10-CM | POA: Diagnosis present

## 2012-05-06 DIAGNOSIS — E1159 Type 2 diabetes mellitus with other circulatory complications: Secondary | ICD-10-CM | POA: Diagnosis present

## 2012-05-06 DIAGNOSIS — I5032 Chronic diastolic (congestive) heart failure: Secondary | ICD-10-CM | POA: Diagnosis present

## 2012-05-06 DIAGNOSIS — F329 Major depressive disorder, single episode, unspecified: Secondary | ICD-10-CM | POA: Diagnosis present

## 2012-05-06 DIAGNOSIS — R053 Chronic cough: Secondary | ICD-10-CM

## 2012-05-06 DIAGNOSIS — R05 Cough: Secondary | ICD-10-CM

## 2012-05-06 DIAGNOSIS — F3289 Other specified depressive episodes: Secondary | ICD-10-CM | POA: Diagnosis present

## 2012-05-06 LAB — CBC
Hemoglobin: 12 g/dL (ref 12.0–15.0)
MCH: 24.6 pg — ABNORMAL LOW (ref 26.0–34.0)
Platelets: 331 10*3/uL (ref 150–400)
RBC: 4.88 MIL/uL (ref 3.87–5.11)
WBC: 8.2 10*3/uL (ref 4.0–10.5)

## 2012-05-06 LAB — SEDIMENTATION RATE: Sed Rate: 11 mm/hr (ref 0–22)

## 2012-05-06 LAB — CBC WITH DIFFERENTIAL/PLATELET
Basophils Absolute: 0 10*3/uL (ref 0.0–0.1)
HCT: 38.2 % (ref 36.0–46.0)
Lymphocytes Relative: 28 % (ref 12–46)
Neutro Abs: 4.9 10*3/uL (ref 1.7–7.7)
Platelets: 361 10*3/uL (ref 150–400)
RDW: 15.5 % (ref 11.5–15.5)
WBC: 8.1 10*3/uL (ref 4.0–10.5)

## 2012-05-06 LAB — COMPLETE METABOLIC PANEL WITH GFR
AST: 11 U/L (ref 0–37)
BUN: 17 mg/dL (ref 6–23)
Calcium: 10 mg/dL (ref 8.4–10.5)
Chloride: 103 mEq/L (ref 96–112)
Creat: 0.66 mg/dL (ref 0.50–1.10)

## 2012-05-06 LAB — PROTIME-INR
INR: 1.06 (ref 0.00–1.49)
Prothrombin Time: 13.7 seconds (ref 11.6–15.2)

## 2012-05-06 LAB — APTT: aPTT: 35 seconds (ref 24–37)

## 2012-05-06 LAB — PHOSPHORUS: Phosphorus: 3.7 mg/dL (ref 2.3–4.6)

## 2012-05-06 LAB — GLUCOSE, CAPILLARY: Glucose-Capillary: 241 mg/dL — ABNORMAL HIGH (ref 70–99)

## 2012-05-06 MED ORDER — INSULIN ASPART 100 UNIT/ML ~~LOC~~ SOLN
0.0000 [IU] | Freq: Three times a day (TID) | SUBCUTANEOUS | Status: DC
  Administered 2012-05-06: 3 [IU] via SUBCUTANEOUS

## 2012-05-06 MED ORDER — ASPIRIN 81 MG PO CHEW
81.0000 mg | CHEWABLE_TABLET | Freq: Every day | ORAL | Status: DC
  Administered 2012-05-06 – 2012-05-08 (×3): 81 mg via ORAL
  Filled 2012-05-06 (×3): qty 1

## 2012-05-06 MED ORDER — SODIUM CHLORIDE 0.9 % IV SOLN: 250.0000 mL | INTRAVENOUS | Status: DC | PRN

## 2012-05-06 MED ORDER — ALBUTEROL SULFATE HFA 108 (90 BASE) MCG/ACT IN AERS: 2.0000 | INHALATION_SPRAY | RESPIRATORY_TRACT | Status: DC | PRN

## 2012-05-06 MED ORDER — CITALOPRAM HYDROBROMIDE 20 MG PO TABS
20.0000 mg | ORAL_TABLET | Freq: Every day | ORAL | Status: DC
  Administered 2012-05-07 – 2012-05-08 (×2): 20 mg via ORAL
  Filled 2012-05-06 (×2): qty 1

## 2012-05-06 MED ORDER — INSULIN ASPART 100 UNIT/ML ~~LOC~~ SOLN
0.0000 [IU] | SUBCUTANEOUS | Status: DC
  Administered 2012-05-07 (×3): 1 [IU] via SUBCUTANEOUS
  Administered 2012-05-07: 3 [IU] via SUBCUTANEOUS

## 2012-05-06 MED ORDER — OXYCODONE HCL 5 MG PO TABS
5.0000 mg | ORAL_TABLET | ORAL | Status: DC | PRN
  Administered 2012-05-06 – 2012-05-07 (×3): 5 mg via ORAL
  Filled 2012-05-06 (×3): qty 1

## 2012-05-06 MED ORDER — ALBUTEROL SULFATE (5 MG/ML) 0.5% IN NEBU
2.5000 mg | INHALATION_SOLUTION | Freq: Four times a day (QID) | RESPIRATORY_TRACT | Status: DC
  Administered 2012-05-06: 2.5 mg via RESPIRATORY_TRACT
  Filled 2012-05-06: qty 0.5

## 2012-05-06 MED ORDER — LISINOPRIL 40 MG PO TABS
40.0000 mg | ORAL_TABLET | Freq: Every day | ORAL | Status: DC
  Administered 2012-05-07 – 2012-05-08 (×2): 40 mg via ORAL
  Filled 2012-05-06 (×2): qty 1

## 2012-05-06 MED ORDER — CLINDAMYCIN PHOSPHATE 300 MG/50ML IV SOLN
300.0000 mg | Freq: Three times a day (TID) | INTRAVENOUS | Status: DC
  Administered 2012-05-06 – 2012-05-08 (×5): 300 mg via INTRAVENOUS
  Filled 2012-05-06 (×8): qty 50

## 2012-05-06 MED ORDER — HEPARIN SODIUM (PORCINE) 5000 UNIT/ML IJ SOLN
5000.0000 [IU] | Freq: Three times a day (TID) | INTRAMUSCULAR | Status: DC
  Administered 2012-05-06 – 2012-05-08 (×5): 5000 [IU] via SUBCUTANEOUS
  Filled 2012-05-06 (×8): qty 1

## 2012-05-06 MED ORDER — DOCUSATE SODIUM 100 MG PO CAPS
100.0000 mg | ORAL_CAPSULE | Freq: Every day | ORAL | Status: DC
  Administered 2012-05-06 – 2012-05-08 (×3): 100 mg via ORAL
  Filled 2012-05-06 (×3): qty 1

## 2012-05-06 MED ORDER — CARVEDILOL 25 MG PO TABS
25.0000 mg | ORAL_TABLET | Freq: Two times a day (BID) | ORAL | Status: DC
  Administered 2012-05-06 – 2012-05-08 (×4): 25 mg via ORAL
  Filled 2012-05-06 (×6): qty 1

## 2012-05-06 MED ORDER — INSULIN GLARGINE 100 UNIT/ML ~~LOC~~ SOLN
5.0000 [IU] | Freq: Every day | SUBCUTANEOUS | Status: DC
  Administered 2012-05-06 – 2012-05-07 (×2): 5 [IU] via SUBCUTANEOUS
  Filled 2012-05-06 (×3): qty 0.05

## 2012-05-06 MED ORDER — CLINDAMYCIN HCL 300 MG PO CAPS
600.0000 mg | ORAL_CAPSULE | Freq: Three times a day (TID) | ORAL | Status: DC
  Filled 2012-05-06: qty 2

## 2012-05-06 MED ORDER — SODIUM CHLORIDE 0.9 % IJ SOLN: 3.0000 mL | INTRAMUSCULAR | Status: DC | PRN

## 2012-05-06 MED ORDER — ACETAMINOPHEN 650 MG RE SUPP: 650.0000 mg | Freq: Four times a day (QID) | RECTAL | Status: DC | PRN

## 2012-05-06 MED ORDER — ASPIRIN 81 MG PO TABS: 81.0000 mg | ORAL_TABLET | Freq: Every day | ORAL | Status: DC

## 2012-05-06 MED ORDER — CLINDAMYCIN HCL 300 MG PO CAPS
300.0000 mg | ORAL_CAPSULE | Freq: Three times a day (TID) | ORAL | Status: DC
  Filled 2012-05-06 (×2): qty 1

## 2012-05-06 MED ORDER — SIMVASTATIN 20 MG PO TABS
20.0000 mg | ORAL_TABLET | Freq: Every day | ORAL | Status: DC
  Administered 2012-05-06 – 2012-05-07 (×2): 20 mg via ORAL
  Filled 2012-05-06 (×3): qty 1

## 2012-05-06 MED ORDER — ACETAMINOPHEN 325 MG PO TABS: 650.0000 mg | ORAL_TABLET | Freq: Four times a day (QID) | ORAL | Status: DC | PRN

## 2012-05-06 MED ORDER — SODIUM CHLORIDE 0.9 % IJ SOLN
3.0000 mL | Freq: Two times a day (BID) | INTRAMUSCULAR | Status: DC
  Administered 2012-05-06 – 2012-05-08 (×3): 3 mL via INTRAVENOUS

## 2012-05-06 NOTE — Consult Note (Signed)
Reason for Consult:left breast abscess Referring Physician: [name] is an 79 y.o. female.  HPI: asked to see pt at request of Dr [name] for left breast swelling,  Redness and pain for 3 weeks. Pt has multiple medical problems.  Currently eating dinner.  No fever or chills.  Past Medical History  Diagnosis Date  . Diabetes mellitus 2007    HgA1C (02/20/2010) = 9.2, HgA1C (03/20/2009) = 12.1  . Hyperlipidemia   . Hypertension   . GERD (gastroesophageal reflux disease)   . CVA (cerebrovascular accident) 2006     right embolic stroke, no residual deficits  . Diverticulitis   . CVA (cerebral infarction) 7-yrs ago  . Arthritis   . Dysphagia   . CHF (congestive heart failure)   . DIABETES MELLITUS, TYPE II 12/04/2005  . HYPERLIPIDEMIA 12/04/2005  . HYPERTENSION 12/04/2005  . GERD 12/04/2005  . ARTHRITIS, KNEE 03/24/2006  . Lung mass 07/08/2010  . Meningioma 07/21/2010  . Hemangioma of liver 12/02/2010  . Depression 12/02/2010  . Adenocarcinoma of lung      Right upper lobe adenocarcinoma.     Past Surgical History  Procedure Laterality Date  . Abdominal hysterectomy    . Video bronchoscope.  12/29/2007    [place]  . Wide excision of left upper back mass.    . Extracapsular cataract extraction with intraocular      lens implantation.  . Right vats,right thoracotomy,right lower lobectomy with node dissection      Family History  Problem Relation Age of Onset  . Hyperlipidemia Brother   . Hypertension Brother   . Diabetes Brother     Social History:  reports that she quit smoking about 12 years ago. Her smoking use included Cigarettes. She smoked 0.00 packs per day. She has never used smokeless tobacco. She reports that she does not drink alcohol or use illicit drugs.  Allergies: No Known Allergies  Medications: I have reviewed the patient's current medications.  Results for orders placed during the hospital encounter of 05/06/12 (from the past 48 hour(s))    CBC     Status: Abnormal   Collection Time    05/06/12  5:39 PM      Result Value Range   WBC 8.2  4.0 - 10.5 K/uL   RBC 4.88  3.87 - 5.11 MIL/uL   Hemoglobin 12.0  12.0 - 15.0 g/dL   HCT 16.1  09.6 - 04.5 %   MCV 77.7 (*) 78.0 - 100.0 fL   MCH 24.6 (*) 26.0 - 34.0 pg   MCHC 31.7  30.0 - 36.0 g/dL   RDW 40.9  81.1 - 91.4 %   Platelets 331  150 - 400 K/uL  GLUCOSE, CAPILLARY     Status: Abnormal   Collection Time    05/06/12  5:39 PM      Result Value Range   Glucose-Capillary 234 (*) 70 - 99 mg/dL    No results found.  Review of Systems  Constitutional: Positive for fever. Negative for chills.  HENT: Negative.   Respiratory: Negative.   Cardiovascular: Negative.   Gastrointestinal: Negative.   Genitourinary: Negative.   Musculoskeletal: Negative.   Skin: Negative.   Neurological: Negative.   Endo/Heme/Allergies: Negative.   Psychiatric/Behavioral: Negative.    Blood pressure 188/74, pulse 77, temperature 98.7 F (37.1 C), resp. rate 16, SpO2 99.00%. Physical Exam  Constitutional: She is oriented to person, place, and time.  HENT:  Head: Normocephalic.  Eyes: EOM are normal. Pupils are  equal, round, and reactive to light.  Neck: Normal range of motion. Neck supple.  Respiratory: Right breast exhibits no inverted nipple, no mass, no nipple discharge and no skin change. Left breast exhibits mass, skin change and tenderness. Left breast exhibits no inverted nipple and no nipple discharge.    Musculoskeletal: Normal range of motion.  Neurological: She is alert and oriented to person, place, and time.  Skin: Skin is warm and dry.  Psychiatric: She has a normal mood and affect. Her behavior is normal. Judgment and thought content normal.    Assessment/Plan: Left breast abscess 3 cm present for 3 weeks.  Needs I/D in OR or at bedside.  She is eating dinner currently so will make NPO  Tonight  And do in am.  Start antibiotics.    Matthan Sledge A. 05/06/2012, 7:05 PM

## 2012-05-06 NOTE — Progress Notes (Signed)
Notified MD on call that pt stated that her Lantus had been stopped about one week ago. Per MD, RN to give Lantus per order. Also received order to change CBG's to Q4H due to pt NPO status and change SSI to Q4H.

## 2012-05-06 NOTE — H&P (Addendum)
Hospital Admission Note Date: 05/06/2012  Patient name: [name] Medical record number: [id number] Date of birth: 10/10/1933 Age: 79 y.o. Gender: female PCP: Almyra Deforest, MD  Medical Service: Internal Medicine Teaching Service   Attending physician:  Dr. Criselda Peaches   1st Contact:  Dr. Garald Braver  Pager:[phone] 2nd Contact:  Dr. Clyde Lundborg   Pager:[phone] After 5 pm or weekends: 1st Contact:      Pager: [phone] 2nd Contact:      Pager: [phone]  Chief Complaint: Left breast abscess  History of Present Illness: [name] is a 79 year old woman with PMH of CHF (Rest Myoview 10/12 with EF 40%), lung cancer (Right upper lobe, stage IB adenocarcinoma s/p Right VATS with RLL lobectomy and node dissection in 10/12), HTN, HLD, CVA, and DM2, who presented to the [facility] today for follow up appointment for left breast cellulitis that now has abscess formation with several draining tracts. She reported that almost one month ago she noticed a swollen "ball" on the medial aspect of her left breast that progressively increased in size. She was evaluate at the [facility] last week for this problem. At that time she had redness, increased warmth, induration, and tenderness of her medial left breast with no fluctuance or drainage noted. She was treated for breast cellulitis with clindamycin 300mg  TID for a 10 day course. Since then she reports that the tender area become softer with malodorous cream colored drainage sometimes tinged with blood. She assures compliance with clindamycin and denies applying any creams to the swollen area. She has a chronic cough that has been present since her lobectomy but denies increased sputum or worsening of her cough.   She denies decreased appetite, fever/chills, nipple discharge, right breast pain or swelling, abdominal pain, N/V, hematuria, or dysuria. She has had diarrhea while taking metformin which improved since she stopped taking this medication.   Meds: Medications Prior to  Admission  Medication Sig Dispense Refill  . albuterol (PROVENTIL HFA;VENTOLIN HFA) 108 (90 BASE) MCG/ACT inhaler Inhale 2 puffs into the lungs every 4 (four) hours as needed for wheezing.  1 Inhaler  3  . albuterol (PROVENTIL) (2.5 MG/3ML) 0.083% nebulizer solution Take 3 mLs (2.5 mg total) by nebulization every 6 (six) hours as needed for wheezing.  75 mL  12  . aspirin 81 MG tablet Take 81 mg by mouth daily.        . carvedilol (COREG) 25 MG tablet Take 1 tablet (25 mg total) by mouth 2 (two) times daily with a meal.  60 tablet  0  . citalopram (CELEXA) 20 MG tablet TAKE 1 TABLET (20 MG TOTAL) BY MOUTH DAILY.  30 tablet  1  . clindamycin (CLEOCIN) 300 MG capsule Take 1 capsule (300 mg total) by mouth 3 (three) times daily.  30 capsule  0  . CVS STOOL SOFTENER 100 MG capsule TAKE 1 CAPSULE BY MOUTH DAILY.  90 capsule  0  . lisinopril (PRINIVIL,ZESTRIL) 40 MG tablet       . simvastatin (ZOCOR) 20 MG tablet TAKE 1 TABLET EVERY DAY  90 tablet  0   Allergies: Allergies as of 05/06/2012  . (No Known Allergies)   Past Medical History  Diagnosis Date  . Diabetes mellitus 2007    HgA1C (02/20/2010) = 9.2, HgA1C (03/20/2009) = 12.1  . Hyperlipidemia   . Hypertension   . GERD (gastroesophageal reflux disease)   . CVA (cerebrovascular accident) 2006     right embolic stroke, no residual deficits  . Diverticulitis   .  CVA (cerebral infarction) 7-yrs ago  . Arthritis   . Dysphagia   . CHF (congestive heart failure)   . DIABETES MELLITUS, TYPE II 12/04/2005  . HYPERLIPIDEMIA 12/04/2005  . HYPERTENSION 12/04/2005  . GERD 12/04/2005  . ARTHRITIS, KNEE 03/24/2006  . Lung mass 07/08/2010  . Meningioma 07/21/2010  . Hemangioma of liver 12/02/2010  . Depression 12/02/2010  . Adenocarcinoma of lung      Right upper lobe adenocarcinoma.    Past Surgical History  Procedure Laterality Date  . Abdominal hysterectomy    . Video bronchoscope.  12/29/2007    [place]  . Wide excision of left upper back  mass.    . Extracapsular cataract extraction with intraocular      lens implantation.  . Right vats,right thoracotomy,right lower lobectomy with node dissection     Family History  Problem Relation Age of Onset  . Hyperlipidemia Brother   . Hypertension Brother   . Diabetes Brother    History   Social History  . Marital Status: Widowed    Spouse Name: N/A    Number of Children: N/A  . Years of Education: N/A   Occupational History  . Not on file.   Social History Main Topics  . Smoking status: Former Smoker    Types: Cigarettes    Quit date: 02/17/2000  . Smokeless tobacco: Never Used  . Alcohol Use: No  . Drug Use: No  . Sexually Active: Not on file   Other Topics Concern  . Not on file   Social History Narrative   Patient requests to use [employer] for her diabets testing supplies as of 09/05/2009.    Review of Systems: Pertinent items are noted in HPI.  Physical Exam: Blood pressure 188/74, pulse 77, temperature 98.7 F (37.1 C), resp. rate 16, SpO2 99.00%. Vitals reviewed. General: Sitting in wheelchair, in NAD HEENT: PERRL, EOMI, no scleral icterus Breast: Left breast with a ~10cm circular mass at 9 o'clock that is tender, erythematous and indurated with multiple tracts with purulent, malodorous drainage with tinge of blood. Copious amount of purulent discharge drained upon palpation. No skin dimpling, no nipple discharge or inversion. Right breast with no mass, no nipple discharge nor skin changes.  Cardiac: RRR, no rubs, murmurs or gallops Pulm: Decreased air movement in right lung fields, otherwise CTAB.  Abd: soft, nontender, nondistended, BS present Ext: warm and well perfused, trace pretibial edema. Neuro: alert and oriented X3, cranial nerves II-XII grossly intact, strength and sensation to light touch equal in bilateral upper and lower extremities  Lab results: Basic Metabolic Panel:  Recent Labs  45/40/98 1555  NA 142  K 4.9  CL 103   CO2 30  GLUCOSE 240*  BUN 17  CREATININE 0.66  CALCIUM 10.0  MG 2.1  PHOS 3.7   Liver Function Tests:  Recent Labs  05/06/12 1555  AST 11  ALT 8  ALKPHOS 70  BILITOT 0.3  PROT 7.5  ALBUMIN 3.8   CBC:  Recent Labs  05/06/12 1555  WBC 8.1  NEUTROABS 4.9  HGB 12.0  HCT 38.2  MCV 78.0  PLT 361   CBG:  Recent Labs  05/06/12 1510 05/06/12 1739  GLUCAP 210* 234*   Urine Drug Screen: Drugs of Abuse     Component Value Date/Time   LABOPIA SEE NOTE 09/07/2011 1647   LABOPIA NEG 07/02/2006 2021   COCAINSCRNUR NEG 09/07/2011 1647   LABBENZ SEE NOTE 09/07/2011 1647   LABBENZ NEG 07/02/2006 2021  AMPHETMU NEG 07/02/2006 2021   LABBARB NEG 09/07/2011 1647     Assessment & Plan by Problem:  Left breast abscess. Currently receiving treatment for breast cellulitis with day 7/10 of clindamycin but now with abscess formation. The abscess is currently draining on its own with copious amounts of purulent discharged expressed with gentle palpation but has areas of induration. This is concerning for antibiotic treatment failure with possible need for surgical intervention. Likely etiology is from _Staphylococcus aureus_ or _Streptococcus epidermitis_  Infection but could also be secondary to MRSA. She currently has no evidence of systemic infection with no tachycardia, fever, and stable BP. Inflammatory breast cancer is also of concern although this would be an atypical presentation. Of note, her last mammogram in Epic was on 2007 and was normal but the patient reports having a mammogram within 2 years that was normal. She has no family history of breast cancer but she does have personal history of lung adenocarcinoma.  -Admit to Med Surg -Consult General Surgery for possible I&D -Continue Clindamycin but increase dose to 600mg  TID (7 day course) for complicated skin infection that could be due to MRSA.  -Breast US -CBC, CMP, ESR -Wound swab culture -Oxycodone 5mg  q4h PRN for  pain  DM2. Last Hgb A1C of 10.9% on 04/29/12. She was taken off Lantus due to concerns for hypoglycemia secondary to inappropriate use of insulin and was started on metformin 1000mg  BID. She had diarrhea with metformin and has stopped taking this medication.  -Lantus 5 units qHS -SSI  HTN. BP elevated to 158/82 at the [facility]. She is on lisinopril 40mg  daily and Coreg 25mg . Acute elevation of BP could be secondary to stress and pain. Will continue to monitor and consider escalation of anti-hypertensive--per JNC8, thiazide for AA patient but will also consider CCB.   History of RUL adenocarcinoma. She is now s/p right lobectomy. She had repeat CXR last week remarkable for small right pleural effusion versus pleural thickening. She was supposed to follow up with Dr. Arbutus Ped for repeat CT in December 2013 but has not done so. She has a persistent cough at this time but denies increased SOB or sputum production. We will consider Oncology consult during this hospitalization and possible repeat CT chest.  -Continue Albuterol inhaler PRN  -Continue Albuterol neb PRN -Repeat CXR, PA and Lateral  CHF.  Echo on 5/12 with EF of 15-20% and Myoview with thinning of apex, however, Rest Myoview on 10/12 with improved EF of 40% with nonischemic DCM.  -Judicious use of IVF -Continue ACEi, BB, and ASA as well as statin -Daily weights   Depression. Stable. We will continue her home Celexa 20mg  daily.   DVT prophylaxis. Heparin TID  FEN NSL Replete as needed Carb mod diet   Dispo: Disposition is deferred at this time, awaiting improvement of current medical problems. Anticipated discharge in approximately 2-3 day(s).   The patient does have a current PCP Almyra Deforest, MD), therefore will be requiring OPC follow-up after discharge.   The patient does not have transportation limitations that hinder transportation to clinic appointments.  SignedKy Barban 05/06/2012, 5:44 PM

## 2012-05-06 NOTE — Assessment & Plan Note (Signed)
Failed oral therapy with clindamycin, now with draining abscess. Unclear how long lesion present. No evidence of systemic illness or bacteremia (afebrile, stable vitals) She will need inpatient admission for IV antibiotics and surgical debridement. Patient and family in agreement. Medicine called for admission.  - Will check Cmet, CBC, ESR in clinic - Imaging, abx, surgical consult per inpatient team.

## 2012-05-06 NOTE — Progress Notes (Signed)
Patient ID: [name], female   DOB: Jan 20, 1934, 79 y.o.   MRN: [id number]  Subjective:   Patient ID: [name] female   DOB: 03-16-1933 79 y.o.   MRN: [id number]  HPI: Ms.Ciin Lacretia Nicks Burns is a 78 y.o. female with history of lung cancer RUL s/p resection, T2DM, HLD, HTN, CVA, CHF presenting to the clinic for follow up of L breast mass. Patient reports about a month ago she noticed a swollen area on the inner aspect of her right breast. This area had been enlarging over few weeks. She presented to the clinic last week. At that time, the area was evaluated and noted to be indurated with erythema, warmth, and tenderness. No fluctuance or drainage was present that time. She was diagnosed with cellulitis and prescribe clindamycin 300 mg 3 times daily for 10 days. Today she presents back for follow-up. In the interim, over the past several days she's noticed increased pain and now drainage of pus from the area. She cannot tell that getting bigger not. She's been taking clindamycin as prescribed. She denies any fever, chills, nausea, vomiting, dizziness or abdominal pain. At her visit last week, diabetes was noted to be uncontrolled with hemoglobin A1c of 10.9. Patient previously been on Lantus, but this was discontinued due to concern of inappropriate use of hypoglycemia. Metformin 1000 mg twice a day was started at this time. She also reports that she has stopped taking metformin as it was causing diarrhea. Shows a complaint of a persistent chronic cough her last visit. She has history of adenocarcinoma of the right lung status post resection. She follows with Dr. Arbutus Ped. Scheduled to have repeat CT scan of the chest in December but did not followup. A repeat a chest x-ray last week, which was notable for persistent small right pleural effusion versus pleural thickening. Her cough persists.  For her blood pressure, she was taking cozaar and lisinopril at visit last week. Cozaar discontinued. For her  heart failure, she was taking lasix but this medicine was discontinued at clinic visit last week as patient appeared dry on exam.   Past Medical History  Diagnosis Date  . Diabetes mellitus 2007    HgA1C (02/20/2010) = 9.2, HgA1C (03/20/2009) = 12.1  . Hyperlipidemia   . Hypertension   . GERD (gastroesophageal reflux disease)   . CVA (cerebrovascular accident) 2006     right embolic stroke, no residual deficits  . Diverticulitis   . CVA (cerebral infarction) 7-yrs ago  . Arthritis   . Dysphagia   . CHF (congestive heart failure)   . DIABETES MELLITUS, TYPE II 12/04/2005  . HYPERLIPIDEMIA 12/04/2005  . HYPERTENSION 12/04/2005  . GERD 12/04/2005  . ARTHRITIS, KNEE 03/24/2006  . Lung mass 07/08/2010  . Meningioma 07/21/2010  . Hemangioma of liver 12/02/2010  . Depression 12/02/2010  . Adenocarcinoma of lung      Right upper lobe adenocarcinoma.    Current Outpatient Prescriptions  Medication Sig Dispense Refill  . albuterol (PROVENTIL HFA;VENTOLIN HFA) 108 (90 BASE) MCG/ACT inhaler Inhale 2 puffs into the lungs every 4 (four) hours as needed for wheezing.  1 Inhaler  3  . albuterol (PROVENTIL) (2.5 MG/3ML) 0.083% nebulizer solution Take 3 mLs (2.5 mg total) by nebulization every 6 (six) hours as needed for wheezing.  75 mL  12  . aspirin 81 MG tablet Take 81 mg by mouth daily.        . carvedilol (COREG) 25 MG tablet Take 1 tablet (25 mg total)  by mouth 2 (two) times daily with a meal.  60 tablet  0  . citalopram (CELEXA) 20 MG tablet TAKE 1 TABLET (20 MG TOTAL) BY MOUTH DAILY.  30 tablet  1  . clindamycin (CLEOCIN) 300 MG capsule Take 1 capsule (300 mg total) by mouth 3 (three) times daily.  30 capsule  0  . [employer] STOOL SOFTENER 100 MG capsule TAKE 1 CAPSULE BY MOUTH DAILY.  90 capsule  0  . lisinopril (PRINIVIL,ZESTRIL) 40 MG tablet       . metFORMIN (GLUCOPHAGE) 1000 MG tablet Take 1 tablet (1,000 mg total) by mouth 2 (two) times daily with a meal.  60 tablet  2  .  oxyCODONE-acetaminophen (PERCOCET) 7.5-500 MG per tablet Take 1 tablet by mouth 3 (three) times daily as needed for pain.  90 tablet  0  . simvastatin (ZOCOR) 20 MG tablet TAKE 1 TABLET EVERY DAY  90 tablet  0   No current facility-administered medications for this visit.   Family History  Problem Relation Age of Onset  . Hyperlipidemia Brother   . Hypertension Brother   . Diabetes Brother    History   Social History  . Marital Status: Widowed    Spouse Name: N/A    Number of Children: N/A  . Years of Education: N/A   Social History Main Topics  . Smoking status: Former Smoker    Types: Cigarettes    Quit date: 02/17/2000  . Smokeless tobacco: Never Used  . Alcohol Use: No  . Drug Use: No  . Sexually Active: None   Other Topics Concern  . None   Social History Narrative   Patient requests to use [employer] for her diabets testing supplies as of 09/05/2009.   Review of Systems: 10 pt ROS performed, pertinent positives and negatives noted in HPI Objective:  Physical Exam: Filed Vitals:   05/06/12 1449  BP: 158/82  Pulse: 80  Temp: 98.6 F (37 C)  TempSrc: Oral  Height: 5\' 4"  (1.626 m)  Weight: 202 lb 9.6 oz (91.899 kg)  SpO2: 100%   Constitutional: Vital signs reviewed.  Patient is an elderly female sitting in a wheelchair in no acute distress and cooperative with exam. Alert and oriented x3.  Head: Normocephalic and atraumatic Eyes: PERRL, EOMI, conjunctivae normal, No scleral icterus.  Neck: Supple, no masses Chest: 6cmx6cm large, erythematous indurated nodule with multiple purulent draining tracts over medial aspect of L breast. No LAD appreciated Cardiovascular: RRR, S1 normal, S2 normal, no MRG, pulses symmetric and intact bilaterally Pulmonary/Chest: Decreased air movement in R upper and L lower lung fields. Mild wheezes.  Abdominal: Soft. Non-tender, non-distended, bowel sounds are normal, no masses, organomegaly, or guarding present.   Neurological: No focal deficits. Psychiatric: Normal mood and affect. Assessment & Plan:   Please see problem-based charting for assessment and plan.

## 2012-05-06 NOTE — Progress Notes (Signed)
Report called to Nurse on 6N.  Pt transported via wheelchair to [address].  Angelina Ok, RN 05/06/2012 4:49 PM.

## 2012-05-06 NOTE — Assessment & Plan Note (Signed)
158/82 Blood pressure not controlled on lisinopril. May need escalation of antihypertensive therapy when acute issues resolve.

## 2012-05-06 NOTE — Assessment & Plan Note (Signed)
H/o RUL adenoCa. Supposed to f/u w Dr. Arbutus Ped and repeat CT chest in December, did not follow up. CXR last week w small R pleural effusion vs pleural thickening. - May benefit from CT while inpatient, ? onc consult

## 2012-05-06 NOTE — Assessment & Plan Note (Signed)
Lab Results  Component Value Date   HGBA1C 10.9 04/29/2012   Patient w uncontrolled DM, concern for inappropriate insulin use/hypoglycemia w Lantus at last clinic visit. Lantus stopped last week, pt prescribed metformin 1000mg  BID. Not taking it b/c of diarrhea. - Hold metformin while inpatient - SSI

## 2012-05-07 ENCOUNTER — Inpatient Hospital Stay (HOSPITAL_COMMUNITY): Payer: PRIVATE HEALTH INSURANCE

## 2012-05-07 ENCOUNTER — Encounter (HOSPITAL_COMMUNITY): Payer: Self-pay | Admitting: Radiology

## 2012-05-07 LAB — CBC
MCV: 76.6 fL — ABNORMAL LOW (ref 78.0–100.0)
Platelets: 317 10*3/uL (ref 150–400)
RBC: 4.57 MIL/uL (ref 3.87–5.11)
RDW: 15.6 % — ABNORMAL HIGH (ref 11.5–15.5)
WBC: 6.9 10*3/uL (ref 4.0–10.5)

## 2012-05-07 LAB — GLUCOSE, CAPILLARY
Glucose-Capillary: 134 mg/dL — ABNORMAL HIGH (ref 70–99)
Glucose-Capillary: 139 mg/dL — ABNORMAL HIGH (ref 70–99)
Glucose-Capillary: 146 mg/dL — ABNORMAL HIGH (ref 70–99)
Glucose-Capillary: 218 mg/dL — ABNORMAL HIGH (ref 70–99)
Glucose-Capillary: 249 mg/dL — ABNORMAL HIGH (ref 70–99)

## 2012-05-07 MED ORDER — LIDOCAINE-EPINEPHRINE 1 %-1:100000 IJ SOLN
10.0000 mL | Freq: Once | INTRAMUSCULAR | Status: DC
  Filled 2012-05-07: qty 10

## 2012-05-07 MED ORDER — INSULIN ASPART 100 UNIT/ML ~~LOC~~ SOLN
0.0000 [IU] | Freq: Three times a day (TID) | SUBCUTANEOUS | Status: DC
  Administered 2012-05-07: 3 [IU] via SUBCUTANEOUS
  Administered 2012-05-08: 2 [IU] via SUBCUTANEOUS
  Administered 2012-05-08: 3 [IU] via SUBCUTANEOUS

## 2012-05-07 MED ORDER — MORPHINE SULFATE 2 MG/ML IJ SOLN
2.0000 mg | INTRAMUSCULAR | Status: AC
  Administered 2012-05-07: 2 mg via INTRAVENOUS

## 2012-05-07 MED ORDER — MORPHINE SULFATE 2 MG/ML IJ SOLN
2.0000 mg | INTRAMUSCULAR | Status: DC | PRN
  Administered 2012-05-08: 2 mg via INTRAVENOUS
  Filled 2012-05-07: qty 1

## 2012-05-07 MED ORDER — IOHEXOL 300 MG/ML  SOLN
80.0000 mL | Freq: Once | INTRAMUSCULAR | Status: AC | PRN
  Administered 2012-05-07: 80 mL via INTRAVENOUS

## 2012-05-07 MED ORDER — ONDANSETRON HCL 4 MG/2ML IJ SOLN: 4.0000 mg | Freq: Four times a day (QID) | INTRAMUSCULAR | Status: DC | PRN

## 2012-05-07 MED ORDER — MORPHINE SULFATE 2 MG/ML IJ SOLN
INTRAMUSCULAR | Status: AC
  Filled 2012-05-07: qty 1

## 2012-05-07 MED ORDER — LIDOCAINE-EPINEPHRINE 1 %-1:100000 IJ SOLN
20.0000 mL | Freq: Once | INTRAMUSCULAR | Status: AC
  Administered 2012-05-07: 1 mL via INTRADERMAL
  Filled 2012-05-07: qty 20

## 2012-05-07 NOTE — Progress Notes (Addendum)
Subjective: She tolerated bedside I& D this morning. She had no fever or overnight events. Her pain is well controlled with oxycodone.   Objective: Vital signs in last 24 hours: Filed Vitals:   05/06/12 2108 05/06/12 2114 05/07/12 0656 05/07/12 0700  BP:  162/83 161/75   Pulse:  84 73   Temp:  98.4 F (36.9 C) 98.3 F (36.8 C)   TempSrc:  Oral Oral   Resp:  18 18   Height:    5\' 4"  (1.626 m)  Weight:    202 lb 9.6 oz (91.899 kg)  SpO2: 100% 100% 100%    Weight change:   Intake/Output Summary (Last 24 hours) at 05/07/12 1235 Last data filed at 05/07/12 1115  Gross per 24 hour  Intake    293 ml  Output    750 ml  Net   -457 ml   Vitals reviewed.  General: Sitting in wheelchair, in NAD  HEENT: PERRL, EOMI, no scleral icterus  Breast: Left breast with site of I&D at medial aspect covered with gauze that is c/d/i with no surrounding edema or erythema.   Cardiac: RRR, no rubs, murmurs or gallops  Pulm: Decreased air movement in right lung fields, otherwise CTAB.  Abd: soft, nontender, nondistended, BS present  Ext: warm and well perfused, trace pretibial edema.  Neuro: alert and oriented X3, cranial nerves II-XII grossly intact, strength and sensation to light touch equal in bilateral upper and lower extremities  Lab Results: Basic Metabolic Panel:  Recent Labs Lab 05/06/12 1555  NA 142  K 4.9  CL 103  CO2 30  GLUCOSE 240*  BUN 17  CREATININE 0.66  CALCIUM 10.0  MG 2.1  PHOS 3.7   Liver Function Tests:  Recent Labs Lab 05/06/12 1555  AST 11  ALT 8  ALKPHOS 70  BILITOT 0.3  PROT 7.5  ALBUMIN 3.8   CBC:  Recent Labs Lab 05/06/12 1555 05/06/12 1739 05/07/12 0952  WBC 8.1 8.2 6.9  NEUTROABS 4.9  --   --   HGB 12.0 12.0 11.2*  HCT 38.2 37.9 35.0*  MCV 78.0 77.7* 76.6*  PLT 361 331 317   CBG:  Recent Labs Lab 05/06/12 1510 05/06/12 1739 05/06/12 2116 05/07/12 0059 05/07/12 0409 05/07/12 0805  GLUCAP 210* 234* 241* 218* 139* 134*    Coagulation:  Recent Labs Lab 05/06/12 1739  LABPROT 13.7  INR 1.06   Urine Drug Screen: Drugs of Abuse     Component Value Date/Time   LABOPIA SEE NOTE 09/07/2011 1647   LABOPIA NEG 07/02/2006 2021   COCAINSCRNUR NEG 09/07/2011 1647   LABBENZ SEE NOTE 09/07/2011 1647   LABBENZ NEG 07/02/2006 2021   AMPHETMU NEG 07/02/2006 2021   LABBARB NEG 09/07/2011 1647    Studies/Results: X-ray Chest Pa And Lateral   05/06/2012  *RADIOLOGY REPORT*  Clinical Data: Shortness of breath.  CHEST - 2 VIEW  Comparison: 04/29/2012 and prior chest radiographs dating back to 12/27/2007.  Findings: Cardiomegaly, right thoracic postoperative changes and a small right pleural effusion/pleural thickening again noted. There is no evidence of airspace disease, left pleural effusion, pulmonary edema, pulmonary mass or pneumothorax. No acute or suspicious bony abnormalities are identified.  IMPRESSION: No evidence of acute abnormality.  Right thoracic postoperative changes with small right pleural effusion/pleural thickening.   Original Report Authenticated By: Harmon Pier, M.D.    Ct Chest W Contrast  05/07/2012  *RADIOLOGY REPORT*  Clinical Data: History of right upper lobe adenocarcinoma of the lung, evaluate  for pleural effusion status post right lobectomy in patient with cough  CT CHEST WITH CONTRAST  Technique:  Multidetector CT imaging of the chest was performed following the standard protocol during bolus administration of intravenous contrast.  Contrast: 80mL OMNIPAQUE IOHEXOL 300 MG/ML  SOLN  Comparison: CT thorax performed 07/20/2011, chest x-ray performed 05/06/2012.  Findings: There is mild scarring or atelectasis anteriorly in the right middle lobe.  There is mild focal pleural thickening laterally in the right mid lung zone.  There is no pleural effusion on the right.  There is a small left effusion.  The left lung is clear.  There is no significant abnormality involving the thoracic aorta.  There is  atherosclerotic calcification involving the aorta. There is mild central pulmonary artery dilitation stable.  There is mild cardiomegaly. There is a triangular-shaped soft tissue in the anterior mediastinum which is unchanged from the prior study and is most consistent with residual thymic tissue.  There is a 4 cm subareolar low attenuation breast lesion.  A few scattered tiny pulmonary nodules are stable.  There is a peripherally nodular enhancing low attenuation mass in the right lobe of the liver measuring 7 cm.  On more delayed images this shows further nodular centripetal enhancement.  It is most consistent with the angioma.  It has been previously described.  IMPRESSION:  1.  No evidence of pleural effusion on the right.Mild chronic scarring or atelectasis right mid lung.  2.  Small left pleural effusion.  3.  Hepatic hemangioma.  4.  Left breast mass.  Recommend evaluation with diagnostic mammography.   Original Report Authenticated By: Esperanza Heir, M.D.    US Breast Left  05/06/2012  *RADIOLOGY REPORT*  Clinical Data:  79 year old female with chronic infection draining pus from the inner left breast.  LEFT BREAST ULTRASOUND  Comparison:  None  On physical exam, a 3 cm fluctuant area with overlying skin discoloration is identified at the 9 o'clock position of the left breast 15 cm from the nipple.  Findings: Ultrasound is performed, showing a 3.3 x 1.7 x 3.3 cm complex fluid collection at the 9 o'clock position of the left breast 15 cm from the nipple, compatible with an abscess. No suspicious solid masses are identified.  IMPRESSION: 3.3 x 1.7 x 3.3 cm abscess within the inner left breast.  Clinical follow-up is recommended.  BI-RADS CATEGORY 3:  Probably benign finding(s) - short interval follow-up suggested.  RECOMMENDATION: Clinical/imaging follow-up as indicated.   Original Report Authenticated By: Harmon Pier, M.D.    Medications: I have reviewed the patient's current medications. Scheduled  Meds: . aspirin  81 mg Oral Daily  . carvedilol  25 mg Oral BID WC  . citalopram  20 mg Oral Daily  . clindamycin (CLEOCIN) IV  300 mg Intravenous Q8H  . docusate sodium  100 mg Oral Daily  . heparin  5,000 Units Subcutaneous Q8H  . insulin aspart  0-9 Units Subcutaneous Q4H  . insulin glargine  5 Units Subcutaneous QHS  . lisinopril  40 mg Oral Daily  . simvastatin  20 mg Oral q1800  . sodium chloride  3 mL Intravenous Q12H   Continuous Infusions:  PRN Meds:.sodium chloride, acetaminophen, acetaminophen, albuterol, oxyCODONE, sodium chloride Assessment/Plan:  Left breast abscess. She presented with day 7/10 of clindamycin but with progression to abscess formation. The abscess was initially draining copious amounts of purulent discharged expressed with gentle palpation but has areas of induration. This was concerning for antibiotic treatment failure prompting surgical  intervention. Likely etiology is from _Staphylococcus aureus_ or _Streptococcus epidermitis_ Infection but could also be secondary to MRSA but wound culture is pending. She currently has no evidence of systemic infection with no tachycardia, fever, and stable BP. She has no leucocytosis. Breast ultrasound with 3.3 x 1.7 x 3.3 cm abscess within the inner left breast. She is now s/p bedside I&D with no complications. -Consult to General Surgery, appreciate help managing this patient.   -Cotninue Clindamycin IV, may consider switching to  Bactrim DS BID (7 day course) pending wound culture -f/u Wound swab culture  -Oxycodone 5mg  q4h PRN for pain  -outpatient mammogram   DM2. Last Hgb A1C of 10.9% on 04/29/12. She was taken off Lantus due to concerns for hypoglycemia secondary to inappropriate use of insulin and was started on metformin 1000mg  BID. She had diarrhea with metformin and has stopped taking this medication. Will discharge patient with Lantus 5 units and metformin  500mg  daily (perhaps even 250mg  with meals) to be  increased weekly by 500mg  until maximum dose of 1000mg  BID.  -Lantus 5 units qHS  -SSI   HTN. BP elevated to 158/82 at the [facility]. She is on lisinopril 40mg  daily and Coreg 25mg . Acute elevation of BP could be secondary to stress and pain. Will continue to monitor and consider escalation of anti-hypertensive--per JNC8, thiazide for AA patient but will also consider CCB.   History of RUL adenocarcinoma. She is now s/p right lobectomy. She had repeat CXR last week remarkable for small right pleural effusion versus pleural thickening. She was supposed to follow up with Dr. Arbutus Ped for repeat CT in December 2013 but has not done so. She has a persistent cough at this time but denies increased SOB or sputum production. Repeat CT chest with small right pleural effusion but no evidence for pleural effusion.  -Continue Albuterol inhaler PRN  -Continue Albuterol neb PRN   CHF. Echo on 5/12 with EF of 15-20% and Myoview with thinning of apex, however, Rest Myoview on 10/12 with improved EF of 40% with nonischemic DCM.  -Judicious use of IVF  -Continue ACEi, BB, and ASA as well as statin  -Daily weights   Depression. Stable. We will continue her home Celexa 20mg  daily.   DVT prophylaxis. Heparin TID  FEN  NSL  Replete as needed  Carb mod diet   Dispo: Disposition is deferred at this time, awaiting improvement of current medical problems. Anticipated discharge in approximately 1-2 day (s).   The patient does have a current PCP Almyra Deforest, MD), therefore will be requiring OPC follow-up after discharge.  The patient does not have transportation limitations that hinder transportation to clinic appointments.   .Services Needed at time of discharge: Y = Yes, Blank = No PT:   OT:   RN:   Equipment:   Other:     LOS: 1 day   Sara Chu D 05/07/2012, 12:35 PM

## 2012-05-07 NOTE — H&P (Signed)
Internal Medicine Teaching Service Attending Note Date: 05/07/2012  Patient name: [name]  Medical record number: [id number]  Date of birth: 04/29/1933   I have seen and evaluated [name] and discussed their care with the Residency Team.    [name] is a pleasant 78yo woman who presented to the [facility] complaining of worsening of left breast cellulitis.  [name] was seen previously about 1 month ago and was noted to have left breast cellulitis without any area of induration or swelling.  She was treated with clindamycin which she reports compliance with.  On the day of admission she presented because her symptoms had become worse and she noticed increased swelling with drainage with a malodorous cream colored substance.  She noted the drainage was sometimes tinged with blood.  She further has a chronic cough since undergoing lobectomy for lung cancer.  This seems to be at her baseline.    She denies fever, chills, nipple discharge, changes to the right breast or any subjective symptoms.  She has had diarrhea while on metformin, this medication was self-stopped.   For further information, please see resident note.   Physical Exam: Blood pressure 161/75, pulse 73, temperature 98.3 F (36.8 C), temperature source Oral, resp. rate 18, height 5\' 4"  (1.626 m), weight 202 lb 9.6 oz (91.899 kg), SpO2 100.00%. General appearance: alert, cooperative and appears stated age Head: Normocephalic, without obvious abnormality, atraumatic Eyes: EOMI, anicteric sclerae Lungs: clear to auscultation bilaterally and some crackles at bilateral bases Breasts: + tender area of induration, darkened color on medial left breast at the  In the RLQ.  Multiple palpable tracks with areas that previously opened.  No drainage today on palpation.  Heart: RR, NR, no murmur Abdomen: soft, NT, ND, +BS Extremities: no edema note, warm and well perfused Pulses: 2+ and symmetric Neurologic: Grossly  normal  Lab results: Results for orders placed during the hospital encounter of 05/06/12 (from the past 24 hour(s))  CBC     Status: Abnormal   Collection Time    05/06/12  5:39 PM      Result Value Range   WBC 8.2  4.0 - 10.5 K/uL   RBC 4.88  3.87 - 5.11 MIL/uL   Hemoglobin 12.0  12.0 - 15.0 g/dL   HCT 40.9  81.1 - 91.4 %   MCV 77.7 (*) 78.0 - 100.0 fL   MCH 24.6 (*) 26.0 - 34.0 pg   MCHC 31.7  30.0 - 36.0 g/dL   RDW 78.2  95.6 - 21.3 %   Platelets 331  150 - 400 K/uL  PROTIME-INR     Status: None   Collection Time    05/06/12  5:39 PM      Result Value Range   Prothrombin Time 13.7  11.6 - 15.2 seconds   INR 1.06  0.00 - 1.49  APTT     Status: None   Collection Time    05/06/12  5:39 PM      Result Value Range   aPTT 35  24 - 37 seconds  GLUCOSE, CAPILLARY     Status: Abnormal   Collection Time    05/06/12  5:39 PM      Result Value Range   Glucose-Capillary 234 (*) 70 - 99 mg/dL  GLUCOSE, CAPILLARY     Status: Abnormal   Collection Time    05/06/12  9:16 PM      Result Value Range   Glucose-Capillary 241 (*) 70 -  99 mg/dL  GLUCOSE, CAPILLARY     Status: Abnormal   Collection Time    05/07/12 12:59 AM      Result Value Range   Glucose-Capillary 218 (*) 70 - 99 mg/dL  GLUCOSE, CAPILLARY     Status: Abnormal   Collection Time    05/07/12  4:09 AM      Result Value Range   Glucose-Capillary 139 (*) 70 - 99 mg/dL  GLUCOSE, CAPILLARY     Status: Abnormal   Collection Time    05/07/12  8:05 AM      Result Value Range   Glucose-Capillary 134 (*) 70 - 99 mg/dL   Comment 1 Notify RN      Imaging results:  X-ray Chest Pa And Lateral   05/06/2012  *RADIOLOGY REPORT*  Clinical Data: Shortness of breath.  CHEST - 2 VIEW  Comparison: 04/29/2012 and prior chest radiographs dating back to 12/27/2007.  Findings: Cardiomegaly, right thoracic postoperative changes and a small right pleural effusion/pleural thickening again noted. There is no evidence of airspace disease, left  pleural effusion, pulmonary edema, pulmonary mass or pneumothorax. No acute or suspicious bony abnormalities are identified.  IMPRESSION: No evidence of acute abnormality.  Right thoracic postoperative changes with small right pleural effusion/pleural thickening.   Original Report Authenticated By: Harmon Pier, M.D.    US Breast Left  05/06/2012  *RADIOLOGY REPORT*  Clinical Data:  79 year old female with chronic infection draining pus from the inner left breast.  LEFT BREAST ULTRASOUND  Comparison:  None  On physical exam, a 3 cm fluctuant area with overlying skin discoloration is identified at the 9 o'clock position of the left breast 15 cm from the nipple.  Findings: Ultrasound is performed, showing a 3.3 x 1.7 x 3.3 cm complex fluid collection at the 9 o'clock position of the left breast 15 cm from the nipple, compatible with an abscess. No suspicious solid masses are identified.  IMPRESSION: 3.3 x 1.7 x 3.3 cm abscess within the inner left breast.  Clinical follow-up is recommended.  BI-RADS CATEGORY 3:  Probably benign finding(s) - short interval follow-up suggested.  RECOMMENDATION: Clinical/imaging follow-up as indicated.   Original Report Authenticated By: Harmon Pier, M.D.     Assessment and Plan: I agree with the formulated Assessment and Plan with the following changes:   1. Left Breast Abscess - Surgery consult for I&D - Continue IV clindamycin - Wound culture, follow up as this worsening may be related to Clindamycin resistance - Follow up on last mammogram - Follow up breast ultrasound - Pain control with oxycodone  Other issues as per resident note.   Inez Catalina, MD 3/22/20148:50 AM

## 2012-05-07 NOTE — Progress Notes (Signed)
**Note De-Identified  Obfuscation** RT protocol assessment note: Patient had RLL lobectomy on 10/12.  Home bronchodilator regimen Ventolin 2p Q4 PRN and 2.5mg  ventolin Q6 PRN for wheeze. Patient denies any SOB, SATS 100% on RA, BBS clear, VS WNL. CXR on 05/06/12 indicates no evidence of acute abnormality, right thoracic postoperative change with small pleural effusion/plureal thickening. RT plan discontinue ventolin HHN Q6 for home schedule per patient toleration.

## 2012-05-07 NOTE — Progress Notes (Signed)
Subjective: No new issues  Objective: Vital signs in last 24 hours: Temp:  [98.3 F (36.8 C)-98.7 F (37.1 C)] 98.3 F (36.8 C) (03/22 0656) Pulse Rate:  [73-84] 73 (03/22 0656) Resp:  [16-18] 18 (03/22 0656) BP: (158-188)/(74-83) 161/75 mmHg (03/22 0656) SpO2:  [99 %-100 %] 100 % (03/22 0656) Weight:  [202 lb 9.6 oz (91.899 kg)] 202 lb 9.6 oz (91.899 kg) (03/22 0700) Last BM Date: 05/05/12  Intake/Output from previous day: 03/21 0701 - 03/22 0700 In: 293 [P.O.:240; I.V.:3; IV Piggyback:50] Out: 200 [Urine:200] Intake/Output this shift: Total I/O In: -  Out: 250 [Urine:250]  General appearance: alert, cooperative and no distress Breasts: erythematous and fluctuant area at 9 oclock and medial of left breast  Lab Results:   Recent Labs  05/06/12 1555 05/06/12 1739  WBC 8.1 8.2  HGB 12.0 12.0  HCT 38.2 37.9  PLT 361 331   BMET  Recent Labs  05/06/12 1555  NA 142  K 4.9  CL 103  CO2 30  GLUCOSE 240*  BUN 17  CREATININE 0.66  CALCIUM 10.0   PT/INR  Recent Labs  05/06/12 1739  LABPROT 13.7  INR 1.06   ABG No results found for this basename: PHART, PCO2, PO2, HCO3,  in the last 72 hours  Studies/Results: X-ray Chest Pa And Lateral   05/06/2012  *RADIOLOGY REPORT*  Clinical Data: Shortness of breath.  CHEST - 2 VIEW  Comparison: 04/29/2012 and prior chest radiographs dating back to 12/27/2007.  Findings: Cardiomegaly, right thoracic postoperative changes and a small right pleural effusion/pleural thickening again noted. There is no evidence of airspace disease, left pleural effusion, pulmonary edema, pulmonary mass or pneumothorax. No acute or suspicious bony abnormalities are identified.  IMPRESSION: No evidence of acute abnormality.  Right thoracic postoperative changes with small right pleural effusion/pleural thickening.   Original Report Authenticated By: Harmon Pier, M.D.    US Breast Left  05/06/2012  *RADIOLOGY REPORT*  Clinical Data:   79 year old female with chronic infection draining pus from the inner left breast.  LEFT BREAST ULTRASOUND  Comparison:  None  On physical exam, a 3 cm fluctuant area with overlying skin discoloration is identified at the 9 o'clock position of the left breast 15 cm from the nipple.  Findings: Ultrasound is performed, showing a 3.3 x 1.7 x 3.3 cm complex fluid collection at the 9 o'clock position of the left breast 15 cm from the nipple, compatible with an abscess. No suspicious solid masses are identified.  IMPRESSION: 3.3 x 1.7 x 3.3 cm abscess within the inner left breast.  Clinical follow-up is recommended.  BI-RADS CATEGORY 3:  Probably benign finding(s) - short interval follow-up suggested.  RECOMMENDATION: Clinical/imaging follow-up as indicated.   Original Report Authenticated By: Harmon Pier, M.D.     Anti-infectives: Anti-infectives   Start     Dose/Rate Route Frequency Ordered Stop   05/06/12 2200  clindamycin (CLEOCIN) IVPB 300 mg     300 mg 100 mL/hr over 30 Minutes Intravenous 3 times per day 05/06/12 1915     05/06/12 2000  clindamycin (CLEOCIN) capsule 600 mg  Status:  Discontinued     600 mg Oral 3 times daily 05/06/12 1835 05/06/12 1915   05/06/12 1815  clindamycin (CLEOCIN) capsule 300 mg  Status:  Discontinued     300 mg Oral 3 times daily 05/06/12 1715 05/06/12 1835      Assessment/Plan: s/p * No surgery found * this clearly looks like a breast abscess, I agree  with I/D.  this can likely be done at the bedside. will attempt at the bedside and if inadequate drainage, can do in OR now that she is NPO.  LOS: 1 day    [name] 05/07/2012

## 2012-05-07 NOTE — Procedures (Addendum)
Incision and Drainage Procedure Note  Pre-operative Diagnosis: left breast abscess  Post-operative Diagnosis: same  Indications: This is a 79yo female who developed a breast abscess several weeks ago.  She has been treated with oral abx therapy as an outpatient, but failed to improve.  She was admitted last night and we have been asked to see her for I&D  Anesthesia: 1% lidocaine with epinephrine  Procedure Details  The procedure, risks and complications have been discussed in detail (including, infection, bleeding) with the patient, and the patient has signed consent to the procedure.  The skin was sterilely prepped and draped over the affected area in the usual fashion. After adequate local anesthesia, I&D with a #11 blade was performed on the left breast. Purulent drainage: present The patient was observed until stable.  Findings: About 5-10cc of purulent foul smelling drainage  EBL: 0 cc's  Drains: none  Condition: Tolerated procedure well and Stable   Complications: none.  Disposition: patient should be cont iv abx overnight and then will look at wound in am and reassess for likely dc tomorrow

## 2012-05-08 LAB — BASIC METABOLIC PANEL
Calcium: 9 mg/dL (ref 8.4–10.5)
GFR calc non Af Amer: 80 mL/min — ABNORMAL LOW (ref >90)
Potassium: 4.2 mEq/L (ref 3.5–5.1)
Sodium: 139 mEq/L (ref 135–145)

## 2012-05-08 LAB — GLUCOSE, CAPILLARY: Glucose-Capillary: 203 mg/dL — ABNORMAL HIGH (ref 70–99)

## 2012-05-08 MED ORDER — INSULIN GLARGINE 100 UNIT/ML ~~LOC~~ SOLN: 5.0000 [IU] | Freq: Every day | SUBCUTANEOUS | Status: DC

## 2012-05-08 MED ORDER — METFORMIN HCL 1000 MG PO TABS: 250.0000 mg | ORAL_TABLET | Freq: Two times a day (BID) | ORAL | Status: DC

## 2012-05-08 MED ORDER — INSULIN PEN STARTER KIT
1.0000 | Freq: Once | Status: AC
  Administered 2012-05-08: 1
  Filled 2012-05-08: qty 1

## 2012-05-08 MED ORDER — CLINDAMYCIN HCL 300 MG PO CAPS: 300.0000 mg | ORAL_CAPSULE | Freq: Three times a day (TID) | ORAL | Status: DC

## 2012-05-08 MED ORDER — METFORMIN HCL 1000 MG PO TABS: ORAL_TABLET | ORAL | Status: DC

## 2012-05-08 MED ORDER — INSULIN PEN STARTER KIT: 1.0000 | Freq: Once | Status: DC

## 2012-05-08 MED ORDER — ACETAMINOPHEN 325 MG PO TABS: 650.0000 mg | ORAL_TABLET | Freq: Four times a day (QID) | ORAL | Status: DC | PRN

## 2012-05-08 NOTE — Progress Notes (Signed)
Subjective:  She tolerated bedside I& D. She had no fever or overnight events. Her pain is well controlled with oxycodone.   Objective: Vital signs in last 24 hours: Filed Vitals:   05/07/12 0700 05/07/12 1430 05/07/12 2155 05/08/12 0555  BP:  150/66 128/57 155/77  Pulse:  67 73 71  Temp:  98.2 F (36.8 C) 98 F (36.7 C) 98.3 F (36.8 C)  TempSrc:  Oral Oral   Resp:  18 18 18   Height: 5\' 4"  (1.626 m)     Weight: 202 lb 9.6 oz (91.899 kg)   195 lb (88.451 kg)  SpO2:  100% 99% 98%   Weight change: -7 lb 9.6 oz (-3.448 kg)  Intake/Output Summary (Last 24 hours) at 05/08/12 0857 Last data filed at 05/08/12 0525  Gross per 24 hour  Intake    460 ml  Output   1200 ml  Net   -740 ml   Vitals reviewed.  General: Sitting in wheelchair, in NAD  HEENT: PERRL, EOMI, no scleral icterus  Breast: Left breast with site of I&D at medial aspect covered with gauze that is c/d/i with no surrounding edema or erythema.   Cardiac: RRR, no rubs, murmurs or gallops  Pulm: Decreased air movement in right lung fields, otherwise CTAB.  Abd: soft, nontender, nondistended, BS present  Ext: warm and well perfused, trace pretibial edema.  Neuro: alert and oriented X3, cranial nerves II-XII grossly intact, strength and sensation to light touch equal in bilateral upper and lower extremities  Lab Results: Basic Metabolic Panel:  Recent Labs Lab 05/06/12 1555 05/08/12 0610  NA 142 139  K 4.9 4.2  CL 103 100  CO2 30 29  GLUCOSE 240* 154*  BUN 17 16  CREATININE 0.66 0.75  CALCIUM 10.0 9.0  MG 2.1  --   PHOS 3.7  --    Liver Function Tests:  Recent Labs Lab 05/06/12 1555  AST 11  ALT 8  ALKPHOS 70  BILITOT 0.3  PROT 7.5  ALBUMIN 3.8   CBC:  Recent Labs Lab 05/06/12 1555 05/06/12 1739 05/07/12 0952  WBC 8.1 8.2 6.9  NEUTROABS 4.9  --   --   HGB 12.0 12.0 11.2*  HCT 38.2 37.9 35.0*  MCV 78.0 77.7* 76.6*  PLT 361 331 317   CBG:  Recent Labs Lab 05/07/12 0409  05/07/12 0805 05/07/12 1230 05/07/12 1751 05/07/12 2214 05/08/12 0750  GLUCAP 139* 134* 146* 249* 227* 153*   Coagulation:  Recent Labs Lab 05/06/12 1739  LABPROT 13.7  INR 1.06   Urine Drug Screen: Drugs of Abuse     Component Value Date/Time   LABOPIA SEE NOTE 09/07/2011 1647   LABOPIA NEG 07/02/2006 2021   COCAINSCRNUR NEG 09/07/2011 1647   LABBENZ SEE NOTE 09/07/2011 1647   LABBENZ NEG 07/02/2006 2021   AMPHETMU NEG 07/02/2006 2021   LABBARB NEG 09/07/2011 1647    Studies/Results: X-ray Chest Pa And Lateral   05/06/2012  *RADIOLOGY REPORT*  Clinical Data: Shortness of breath.  CHEST - 2 VIEW  Comparison: 04/29/2012 and prior chest radiographs dating back to 12/27/2007.  Findings: Cardiomegaly, right thoracic postoperative changes and a small right pleural effusion/pleural thickening again noted. There is no evidence of airspace disease, left pleural effusion, pulmonary edema, pulmonary mass or pneumothorax. No acute or suspicious bony abnormalities are identified.  IMPRESSION: No evidence of acute abnormality.  Right thoracic postoperative changes with small right pleural effusion/pleural thickening.   Original Report Authenticated By: Harmon Pier, M.D.  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  Contrast: 80mL OMNIPAQUE IOHEXOL 300 MG/ML  SOLN  Comparison: CT thorax performed 07/20/2011, chest x-ray performed 05/06/2012.  Findings: There is mild scarring or atelectasis anteriorly in the right middle lobe.  There is mild focal pleural thickening laterally in the right mid lung zone.  There is no pleural effusion on the right.  There is a small left  effusion.  The left lung is clear.  There is no significant abnormality involving the thoracic aorta.  There is atherosclerotic calcification involving the aorta. There is mild central pulmonary artery dilitation stable.  There is mild cardiomegaly. There is a triangular-shaped soft tissue in the anterior mediastinum which is unchanged from the prior study and is most consistent with residual thymic tissue.  There is a 4 cm subareolar low attenuation breast lesion.  A few scattered tiny pulmonary nodules are stable.  There is a peripherally nodular enhancing low attenuation mass in the right lobe of the liver measuring 7 cm.  On more delayed images this shows further nodular centripetal enhancement.  It is most consistent with the angioma.  It has been previously described.  IMPRESSION:  1.  No evidence of pleural effusion on the right.Mild chronic scarring or atelectasis right mid lung.  2.  Small left pleural effusion.  3.  Hepatic hemangioma.  4.  Left breast mass.  Recommend evaluation with diagnostic mammography.   Original Report Authenticated By: Esperanza Heir, M.D.    US Breast Left  05/06/2012  *RADIOLOGY REPORT*  Clinical Data:  79 year old female with chronic infection draining pus from the inner left breast.  LEFT BREAST ULTRASOUND  Comparison:  None  On physical exam, a 3 cm fluctuant area with overlying skin discoloration is identified at the 9 o'clock position of the left breast 15 cm from the nipple.  Findings: Ultrasound is performed, showing a 3.3 x 1.7 x 3.3 cm complex fluid collection at the 9 o'clock position of the left breast 15 cm from the nipple, compatible with an abscess. No suspicious solid masses are identified.  IMPRESSION: 3.3 x 1.7 x 3.3 cm abscess within the inner left breast.  Clinical follow-up is recommended.  BI-RADS CATEGORY 3:  Probably benign finding(s) - short interval follow-up suggested.  RECOMMENDATION: Clinical/imaging follow-up as indicated.   Original Report  Authenticated By: Harmon Pier, M.D.    Medications: I have reviewed the patient's current medications. Scheduled Meds: . aspirin  81 mg Oral Daily  . carvedilol  25 mg Oral BID WC  . citalopram  20 mg Oral Daily  . clindamycin (CLEOCIN) IV  300 mg Intravenous Q8H  . docusate sodium  100 mg Oral Daily  . heparin  5,000 Units Subcutaneous Q8H  . insulin aspart  0-9 Units Subcutaneous TID WC & HS  . insulin glargine  5 Units Subcutaneous QHS  . lisinopril  40 mg Oral Daily  . simvastatin  20 mg Oral q1800  . sodium chloride  3 mL Intravenous Q12H   Continuous Infusions:  PRN Meds:.sodium chloride, acetaminophen, acetaminophen, albuterol, morphine injection, ondansetron, oxyCODONE, sodium chloride Assessment/Plan:  Left breast abscess. She presented with day 7/10 of clindamycin but with progression to abscess formation.  The abscess was initially draining copious amounts of purulent discharged expressed with gentle palpation but has areas of induration. This was concerning for antibiotic treatment failure prompting surgical intervention. Likely etiology is from _Staphylococcus aureus_ or _Streptococcus epidermitis_ Infection but could also be secondary to MRSA but wound culture is pending. She currently has no evidence of systemic infection with no tachycardia, fever, and stable BP. She has no leucocytosis. Breast ultrasound with 3.3 x 1.7 x 3.3 cm abscess within the inner left breast. She is now s/p bedside I&D with no complications. Wound culture no growth so far.  -Appreciate general Surgery's help managing this patient. Will follow up further recommendations. -Cotninue Clindamycin IV, may consider switching to  Bactrim DS BID (7 day course) pending wound culture -Oxycodone 5mg  q4h PRN for pain  -outpatient mammogram   DM2. Last Hgb A1C of 10.9% on 04/29/12. She was taken off Lantus due to concerns for hypoglycemia secondary to inappropriate use of insulin and was started on metformin 1000mg   BID. She had diarrhea with metformin and has stopped taking this medication. Will discharge patient with Lantus 5 units and metformin  500mg  daily (perhaps even 250mg  with meals) to be increased weekly by 500mg  until maximum dose of 1000mg  BID.  -Lantus 5 units qHS  -SSI   HTN. BP elevated to 158/82 at the [facility]. She is on lisinopril 40mg  daily and Coreg 25mg . Acute elevation of BP could be secondary to stress and pain. Will continue to monitor and consider escalation of anti-hypertensive--per JNC8, thiazide for [facility] patient but will also consider CCB.  History of RUL adenocarcinoma. She is now s/p right lobectomy. She had repeat CXR last week remarkable for small right pleural effusion versus pleural thickening. She was supposed to follow up with Dr. Arbutus Ped for repeat CT in December 2013 but has not done so. She has a persistent cough at this time but denies increased SOB or sputum production. Repeat CT chest with small right pleural effusion but no evidence for pleural effusion.  -Continue Albuterol inhaler PRN  -Continue Albuterol neb PRN   CHF. Echo on 5/12 with EF of 15-20% and Myoview with thinning of apex, however, Rest Myoview on 10/12 with improved EF of 40% with nonischemic DCM.  -Judicious use of IVF  -Continue ACEi, BB, and ASA as well as statin  -Daily weights   Depression. Stable. We will continue her home Celexa 20mg  daily.   DVT prophylaxis. Heparin TID  FEN  NSL  Replete as needed  Carb mod diet   Dispo: Disposition is deferred at this time, awaiting for surgeon's input. Anticipate discharge if OK with surgeon.  The patient does have a current PCP Almyra Deforest, MD), therefore will be requiring [facility] follow-up after discharge.  The patient does not have transportation limitations that hinder transportation to clinic appointments.   .Services Needed at time of discharge: Y = Yes, Blank = No PT:   OT:   RN:   Equipment:   Other:     LOS: 2 days   Lorretta Harp 05/08/2012, 8:57 AM

## 2012-05-08 NOTE — Progress Notes (Addendum)
Discharge Note. Discharge information and instruction reviewed with pt and pt's daughter. All Rx's were given and reviewed. Did teaching on Lantus Insulin Pen. Pt reported that she has used an insulin pen before and was able to verbalize the process of using the pen. Pt reported that she felt well enough to go home and thanked staff for taking care of her. Pt was escorted to car via wheelchair with staff. Pt discharged to home. Follow up appointments to be made. Signs and symptoms of infection gone over. [facility].

## 2012-05-08 NOTE — Progress Notes (Signed)
   CARE MANAGEMENT NOTE 05/08/2012  Patient:  [name], [name]   Account Number:  [id number]  Date Initiated:  05/08/2012  Documentation initiated by:  [facility]  Subjective/Objective Assessment:     Action/Plan:   Anticipated DC Date:  05/08/2012   Anticipated DC Plan:  HOME W HOME HEALTH SERVICES      DC Planning Services  CM consult      [facility] Choice  HOME HEALTH   Choice offered to / List presented to:  C-1 Patient        HH arranged  HH-1 RN  HH-4 NURSE'S AIDE      HH agency  [facility]   Status of service:  Completed, signed off Medicare Important Message given?   (If response is "NO", the following Medicare IM given date fields will be blank) Date Medicare IM given:   Date Additional Medicare IM given:    Discharge Disposition:  HOME W HOME HEALTH SERVICES  Per UR Regulation:    If discussed at Long Length of Stay Meetings, dates discussed:    Comments:  05/08/2012 1015 Discussed HH with pt and offered choice. States she had [facility] in the past. Requesting [facility] for [facility] RN and aide. Faxed referral to [facility] for [facility] for scheduled dc home today. Isidoro Donning RN CCM Case Mgmt phone [phone]

## 2012-05-08 NOTE — Progress Notes (Signed)
Subjective: She feels better today  Objective: Vital signs in last 24 hours: Temp:  [98 F (36.7 C)-98.3 F (36.8 C)] 98.3 F (36.8 C) (03/23 0555) Pulse Rate:  [67-73] 71 (03/23 0555) Resp:  [18] 18 (03/23 0555) BP: (128-155)/(57-77) 155/77 mmHg (03/23 0555) SpO2:  [98 %-100 %] 98 % (03/23 0555) Weight:  [195 lb (88.451 kg)] 195 lb (88.451 kg) (03/23 0555) Last BM Date: 05/05/12  Intake/Output from previous day: 03/22 0701 - 03/23 0700 In: 460 [P.O.:360; IV Piggyback:100] Out: 1450 [Urine:1450] Intake/Output this shift:    General appearance: alert, cooperative and no distress Breasts: left breast abscess looks better, less tender  Lab Results:   Recent Labs  05/06/12 1739 05/07/12 0952  WBC 8.2 6.9  HGB 12.0 11.2*  HCT 37.9 35.0*  PLT 331 317   BMET  Recent Labs  05/06/12 1555 05/08/12 0610  NA 142 139  K 4.9 4.2  CL 103 100  CO2 30 29  GLUCOSE 240* 154*  BUN 17 16  CREATININE 0.66 0.75  CALCIUM 10.0 9.0   PT/INR  Recent Labs  05/06/12 1739  LABPROT 13.7  INR 1.06   ABG No results found for this basename: PHART, PCO2, PO2, HCO3,  in the last 72 hours  Studies/Results: X-ray Chest Pa And Lateral   05/06/2012  *RADIOLOGY REPORT*  Clinical Data: Shortness of breath.  CHEST - 2 VIEW  Comparison: 04/29/2012 and prior chest radiographs dating back to 12/27/2007.  Findings: Cardiomegaly, right thoracic postoperative changes and a small right pleural effusion/pleural thickening again noted. There is no evidence of airspace disease, left pleural effusion, pulmonary edema, pulmonary mass or pneumothorax. No acute or suspicious bony abnormalities are identified.  IMPRESSION: No evidence of acute abnormality.  Right thoracic postoperative changes with small right pleural effusion/pleural thickening.   Original Report Authenticated By: Harmon Pier, M.D.    Ct Chest W Contrast  05/07/2012  *RADIOLOGY REPORT*  Clinical Data: History of right upper lobe  adenocarcinoma of the lung, evaluate for pleural effusion status post right lobectomy in patient with cough  CT CHEST WITH CONTRAST  Technique:  Multidetector CT imaging of the chest was performed following the standard protocol during bolus administration of intravenous contrast.  Contrast: 80mL OMNIPAQUE IOHEXOL 300 MG/ML  SOLN  Comparison: CT thorax performed 07/20/2011, chest x-ray performed 05/06/2012.  Findings: There is mild scarring or atelectasis anteriorly in the right middle lobe.  There is mild focal pleural thickening laterally in the right mid lung zone.  There is no pleural effusion on the right.  There is a small left effusion.  The left lung is clear.  There is no significant abnormality involving the thoracic aorta.  There is atherosclerotic calcification involving the aorta. There is mild central pulmonary artery dilitation stable.  There is mild cardiomegaly. There is a triangular-shaped soft tissue in the anterior mediastinum which is unchanged from the prior study and is most consistent with residual thymic tissue.  There is a 4 cm subareolar low attenuation breast lesion.  A few scattered tiny pulmonary nodules are stable.  There is a peripherally nodular enhancing low attenuation mass in the right lobe of the liver measuring 7 cm.  On more delayed images this shows further nodular centripetal enhancement.  It is most consistent with the angioma.  It has been previously described.  IMPRESSION:  1.  No evidence of pleural effusion on the right.Mild chronic scarring or atelectasis right mid lung.  2.  Small left pleural effusion.  3.  Hepatic hemangioma.  4.  Left breast mass.  Recommend evaluation with diagnostic mammography.   Original Report Authenticated By: Esperanza Heir, M.D.    US Breast Left  05/06/2012  *RADIOLOGY REPORT*  Clinical Data:  79 year old female with chronic infection draining pus from the inner left breast.  LEFT BREAST ULTRASOUND  Comparison:  None  On physical exam, a  3 cm fluctuant area with overlying skin discoloration is identified at the 9 o'clock position of the left breast 15 cm from the nipple.  Findings: Ultrasound is performed, showing a 3.3 x 1.7 x 3.3 cm complex fluid collection at the 9 o'clock position of the left breast 15 cm from the nipple, compatible with an abscess. No suspicious solid masses are identified.  IMPRESSION: 3.3 x 1.7 x 3.3 cm abscess within the inner left breast.  Clinical follow-up is recommended.  BI-RADS CATEGORY 3:  Probably benign finding(s) - short interval follow-up suggested.  RECOMMENDATION: Clinical/imaging follow-up as indicated.   Original Report Authenticated By: Harmon Pier, M.D.     Anti-infectives: Anti-infectives   Start     Dose/Rate Route Frequency Ordered Stop   05/06/12 2200  clindamycin (CLEOCIN) IVPB 300 mg     300 mg 100 mL/hr over 30 Minutes Intravenous 3 times per day 05/06/12 1915     05/06/12 2000  clindamycin (CLEOCIN) capsule 600 mg  Status:  Discontinued     600 mg Oral 3 times daily 05/06/12 1835 05/06/12 1915   05/06/12 1815  clindamycin (CLEOCIN) capsule 300 mg  Status:  Discontinued     300 mg Oral 3 times daily 05/06/12 1715 05/06/12 1835      Assessment/Plan: s/p * No surgery found * she looks and feels better after I/D.  she will need wet to dry dressing changes or wound packing with Nu Gauze at least daily.  Twice daily would be optimal but daily should be adequate.  PO antibiotics for cellulitis.  She should call [facility] on Monday to schedule follow up appointment with the [facility] or with Dr. Dwain Sarna in 1-2 weeks for wound check.  I would also recommend mammogram and clinical breast exam when this wound has completely healed and she can tolerate the exam to rule out malignancy.   LOS: 2 days    [name] 05/08/2012

## 2012-05-08 NOTE — H&P (Signed)
Note already written

## 2012-05-08 NOTE — Discharge Summary (Signed)
Patient Name:  [name]  MRN: [id number]  PCP: Almyra Deforest, MD  DOB:  [birth year]       Date of Admission:  05/06/2012  Date of Discharge:  05/08/2012      Attending Physician: Dr. Inez Catalina, MD         DISCHARGE DIAGNOSES: 1. Principal Problem: 2.   Left breast abscess 3. Active Problems: 4.   DIABETES MELLITUS, TYPE II 5.   HYPERTENSION 6.   Chronic systolic congestive heart failure 7.    DISPOSITION AND FOLLOW-UP: [name] is to follow-up with the listed providers as detailed below, at patient's visiting, please address following issues:  At IMT clinic: 1. Please assure that patient does not have fever or chill. Check her wound. Check her CBG and make assure she is using Lantus. She is newly started with Lantus on this admission. 2. Make sure that she is to follow up with her surgeon for wound check. 3.  Follow up wound culture final result.   Follow-up Information   Follow up with [facility], MD. (please call [facility] on Monday to schedule follow up appointment with surgeon or with Dr. Dwain Sarna in 1 to 2 weeks for wound check)    Contact information:   [address] [place] [address] [phone]       Please follow up. (internal medicine clinic office will calll you for a follow up appointment)    Contact information:   [address] [place] [address] [phone]     Discharge Orders   Future Orders Complete By Expires     Call MD for:  redness, tenderness, or signs of infection (pain, swelling, redness, odor or green/yellow discharge around incision site)  As directed     Call MD for:  temperature >100.4  As directed     Diet Carb Modified  As directed     Discharge wound care:  As directed     Comments:      Home health care for changing dress daily.    Increase activity slowly  As directed         DISCHARGE MEDICATIONS:   Medication List    TAKE these medications       acetaminophen 325 MG  tablet  Commonly known as:  TYLENOL  Take 2 tablets (650 mg total) by mouth every 6 (six) hours as needed.     albuterol (2.5 MG/3ML) 0.083% nebulizer solution  Commonly known as:  PROVENTIL  Take 3 mLs (2.5 mg total) by nebulization every 6 (six) hours as needed for wheezing.     albuterol 108 (90 BASE) MCG/ACT inhaler  Commonly known as:  PROVENTIL HFA;VENTOLIN HFA  Inhale 2 puffs into the lungs every 4 (four) hours as needed for wheezing.     aspirin 81 MG tablet  Take 81 mg by mouth daily.     carvedilol 25 MG tablet  Commonly known as:  COREG  Take 1 tablet (25 mg total) by mouth 2 (two) times daily with a meal.     citalopram 20 MG tablet  Commonly known as:  CELEXA  TAKE 1 TABLET (20 MG TOTAL) BY MOUTH DAILY.     clindamycin 300 MG capsule  Commonly known as:  CLEOCIN  Take 1 capsule (300 mg total) by mouth 3 (three) times daily.     insulin glargine 100 UNIT/ML injection  Commonly known as:  LANTUS SOLOSTAR  Inject 0.05 mLs (  5 Units total) into the skin at bedtime.     lisinopril 40 MG tablet  Commonly known as:  PRINIVIL,ZESTRIL  Take 40 mg by mouth daily.     metFORMIN 1000 MG tablet  Commonly known as:  GLUCOPHAGE  Please take metformin with food, 250 mg twice daily for 7 days, then increase the dose to 500 mg twice daily for another 7 days, then increase the dose to 1000 mg twice daily and keep it.     simvastatin 20 MG tablet  Commonly known as:  ZOCOR  TAKE 1 TABLET EVERY DAY         CONSULTS:  General surgeon Treatment Team:  [facility], MD    PROCEDURES PERFORMED:  X-ray Chest Pa And Lateral   05/06/2012  *RADIOLOGY REPORT*  Clinical Data: Shortness of breath.  CHEST - 2 VIEW  Comparison: 04/29/2012 and prior chest radiographs dating back to 12/27/2007.  Findings: Cardiomegaly, right thoracic postoperative changes and a small right pleural effusion/pleural thickening again noted. There is no evidence of airspace disease, left pleural effusion,  pulmonary edema, pulmonary mass or pneumothorax. No acute or suspicious bony abnormalities are identified.  IMPRESSION: No evidence of acute abnormality.  Right thoracic postoperative changes with small right pleural effusion/pleural thickening.   Original Report Authenticated By: Harmon Pier, M.D.    Dg Chest 2 View  04/29/2012  *RADIOLOGY REPORT*  Clinical Data: Persistent cough, shortness of breath.  CHEST - 2 VIEW  Comparison: 11/03/2011  Findings: Chronic small right pleural effusion or pleural thickening, similar to prior study.  Postoperative changes in the right hilar region with soft tissue prominence which is stable. Heart is mildly enlarged.  Left lung is clear.  No effusion on the left.  No acute bony abnormality.  IMPRESSION: Cardiomegaly.  Postoperative and chronic changes on the right. Small chronic right effusion versus pleural thickening.   Original Report Authenticated By: Charlett Nose, M.D.    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  Contrast: 80mL OMNIPAQUE IOHEXOL 300 MG/ML  SOLN  Comparison: CT thorax performed 07/20/2011, chest x-ray performed 05/06/2012.  Findings: There is mild scarring or atelectasis anteriorly in the right middle lobe.  There is mild focal pleural thickening laterally in the right mid lung zone.  There is no pleural effusion on the right.  There is a small left effusion.  The left lung is clear.  There is no significant abnormality involving the thoracic aorta.  There is atherosclerotic calcification involving the aorta. There is mild central pulmonary artery dilitation stable.  There is mild cardiomegaly. There is a triangular-shaped  soft tissue in the anterior mediastinum which is unchanged from the prior study and is most consistent with residual thymic tissue.  There is a 4 cm subareolar low attenuation breast lesion.  A few scattered tiny pulmonary nodules are stable.  There is a peripherally nodular enhancing low attenuation mass in the right lobe of the liver measuring 7 cm.  On more delayed images this shows further nodular centripetal enhancement.  It is most consistent with the angioma.  It has been previously described.  IMPRESSION:  1.  No evidence of pleural effusion on the right.Mild chronic scarring or atelectasis  right mid lung.  2.  Small left pleural effusion.  3.  Hepatic hemangioma.  4.  Left breast mass.  Recommend evaluation with diagnostic mammography.   Original Report Authenticated By: Esperanza Heir, M.D.    US Breast Left  05/06/2012  *RADIOLOGY REPORT*  Clinical Data:  79 year old female with chronic infection draining pus from the inner left breast.  LEFT BREAST ULTRASOUND  Comparison:  None  On physical exam, a 3 cm fluctuant area with overlying skin discoloration is identified at the 9 o'clock position of the left breast 15 cm from the nipple.  Findings: Ultrasound is performed, showing a 3.3 x 1.7 x 3.3 cm complex fluid collection at the 9 o'clock position of the left breast 15 cm from the nipple, compatible with an abscess. No suspicious solid masses are identified.  IMPRESSION: 3.3 x 1.7 x 3.3 cm abscess within the inner left breast.  Clinical follow-up is recommended.  BI-RADS CATEGORY 3:  Probably benign finding(s) - short interval follow-up suggested.  RECOMMENDATION: Clinical/imaging follow-up as indicated.   Original Report Authenticated By: Harmon Pier, M.D.        ADMISSION DATA: H&P: Ms. Hoel is a 79 year old woman with PMH of CHF (Rest Myoview 10/12 with EF 40%), lung cancer (Right upper lobe, stage IB adenocarcinoma s/p Right VATS with RLL lobectomy and node dissection in 10/12), HTN, HLD, CVA,  and DM2, who presented to the [facility] today for follow up appointment for left breast cellulitis that now has abscess formation with several draining tracts. She reported that almost one month ago she noticed a swollen "ball" on the medial aspect of her left breast that progressively increased in size. She was evaluate at the [facility] last week for this problem. At that time she had redness, increased warmth, induration, and tenderness of her medial left breast with no fluctuance or drainage noted. She was treated for breast cellulitis with clindamycin 300mg  TID for a 10 day course. Since then she reports that the tender area become softer with malodorous cream colored drainage sometimes tinged with blood. She assures compliance with clindamycin and denies applying any creams to the swollen area. She has a chronic cough that has been present since her lobectomy but denies increased sputum or worsening of her cough.   She denies decreased appetite, fever/chills, nipple discharge, right breast pain or swelling, abdominal pain, N/V, hematuria, or dysuria. She has had diarrhea while taking metformin which improved since she stopped taking this medication.      05/06/12 1555   WBC  8.1   NEUTROABS  4.9   HGB  12.0   HCT  38.2   MCV  78.0   PLT  361    CBG:  Recent Labs   05/06/12 1510  05/06/12 1739   GLUCAP  210*  234*    Urine Drug Screen: Drugs of Abuse      Component  Value  Date/Time     LABOPIA  SEE NOTE  09/07/2011 1647     LABOPIA  NEG  07/02/2006 2021     COCAINSCRNUR  NEG  09/07/2011 1647     LABBENZ  SEE NOTE  09/07/2011 1647     LABBENZ  NEG  07/02/2006 2021     AMPHETMU  NEG  07/02/2006 2021     LABBARB  NEG  09/07/2011 1647    HOSPITAL COURSE:  Left breast abscess: Patient's L breast abscess has been treated with po clindamycin in clinic (day 7/10 of clindamycin), but  with progression to abscess formation. The abscess was initially draining copious amounts of purulent discharge expressed  with gentle palpation but has areas of induration. This was concerning for antibiotic treatment failure prompting surgical intervention. She did not have evidence of systemic infection with no tachycardia, fever, and stable BP. She did not have leucocytosis. Breast ultrasound showed 3.3 x 1.7 x 3.3 cm abscess within the inner left breast. Surgeon was consulted and I&D was performed on 04/2212. Patient tolerated the procedure. Her wound is OK for discharge today (05/08/12) per surgeon. Wound culture has no growth so far. Surgeon suggested to continue po clindamycin for 10 days and follow up culture result. She is to follow up with surgeon in 1 to 2 weeks. She will need daily dressing change. Home Health RN visit was arranged.  DM2. Last Hgb A1C of 10.9% on 04/29/12. She was taken off Lantus due to concerns for hypoglycemia secondary to inappropriate use of insulin and was started on metformin 1000mg  BID. She had diarrhea with metformin and has stopped taking this medication. Will discharge patient with Lantus 5 units and metformin 250mg  daily for one week and increase the dose weekly until maximum dose of 1000mg  BID.    HTN. BP elevated to 158/82 at the [facility]. She is on lisinopril 40mg  daily and Coreg 25mg . Acute elevation of BP could be secondary to stress and pain. Will continue home regimen at discharge. May consider escalation of anti-hypertensive, such as adding CCB at follow up visit. She will be followed up at IMT clinic in 7 to 10 days.  History of RUL adenocarcinoma. She is now s/p right lobectomy. She had repeat CXR last week remarkable for small right pleural effusion versus pleural thickening. She was supposed to follow up with Dr. Arbutus Ped for repeat CT in December 2013 but has not done so. She has a persistent cough at this time but denies increased SOB or sputum production. Repeat CT chest with small right pleural effusion but no evidence for pleural effusion.    CHF. Echo on 5/12 with EF of 15-20%  and Myoview with thinning of apex, however, Rest Myoview on 10/12 with improved EF of 40% with nonischemic DCM. It has been stable in hospital. Will continue ACEi, BB, and ASA as well as statin    Depression. Stable. Will continue her home Celexa.  DISCHARGE DATA: Vital Signs: BP 155/77  Pulse 71  Temp(Src) 98.3 F (36.8 C) (Oral)  Resp 18  Ht 5\' 4"  (1.626 m)  Wt 195 lb (88.451 kg)  BMI 33.46 kg/m2  SpO2 98%  Labs: Results for orders placed during the hospital encounter of 05/06/12 (from the past 24 hour(s))  CULTURE, ROUTINE-ABSCESS     Status: None   Collection Time    05/07/12 12:15 PM      Result Value Range   Specimen Description ABSCESS     Special Requests DRAINAGE FROM LEFT BREAST     Gram Stain PENDING     Culture NO GROWTH 1 DAY     Report Status PENDING    GLUCOSE, CAPILLARY     Status: Abnormal   Collection Time    05/07/12 12:30 PM      Result Value Range   Glucose-Capillary 146 (*) 70 - 99 mg/dL   Comment 1 Notify RN    GLUCOSE, CAPILLARY     Status: Abnormal   Collection Time    05/07/12  5:51 PM      Result Value Range   Glucose-Capillary 249 (*)  70 - 99 mg/dL  GLUCOSE, CAPILLARY     Status: Abnormal   Collection Time    05/07/12 10:14 PM      Result Value Range   Glucose-Capillary 227 (*) 70 - 99 mg/dL   Comment 1 Notify RN     Comment 2 Documented in Chart    BASIC METABOLIC PANEL     Status: Abnormal   Collection Time    05/08/12  6:10 AM      Result Value Range   Sodium 139  135 - 145 mEq/L   Potassium 4.2  3.5 - 5.1 mEq/L   Chloride 100  96 - 112 mEq/L   CO2 29  19 - 32 mEq/L   Glucose, Bld 154 (*) 70 - 99 mg/dL   BUN 16  6 - 23 mg/dL   Creatinine, Ser 2.95  0.50 - 1.10 mg/dL   Calcium 9.0  8.4 - 62.1 mg/dL   GFR calc non Af Amer 80 (*) >90 mL/min   GFR calc Af Amer >90  >90 mL/min  GLUCOSE, CAPILLARY     Status: Abnormal   Collection Time    05/08/12  7:50 AM      Result Value Range   Glucose-Capillary 153 (*) 70 - 99 mg/dL    Comment 1 Notify RN       Services Ordered on Discharge: Y = Yes; Blank = No PT:   OT:   RN: Home health RN for dressing change  Equipment:   Other:      Time Spent on Discharge: 37 min   Signed: Lorretta Harp, MD PGY 2, Internal Medicine Resident 05/08/2012, 10:05 AM

## 2012-05-09 ENCOUNTER — Telehealth (INDEPENDENT_AMBULATORY_CARE_PROVIDER_SITE_OTHER): Payer: Self-pay

## 2012-05-09 ENCOUNTER — Telehealth (INDEPENDENT_AMBULATORY_CARE_PROVIDER_SITE_OTHER): Payer: Self-pay | Admitting: *Deleted

## 2012-05-09 NOTE — Telephone Encounter (Signed)
Called to schedule PO appt but also stated that Tylenol is not helping at all with pain.  [place] MD paged at this time regarding pain control.

## 2012-05-09 NOTE — Discharge Summary (Signed)
I saw [name] on day of discharge and discussed the plan with the resident team.

## 2012-05-09 NOTE — Telephone Encounter (Signed)
Order obtained from Kerby MD for Norco 5/325mg  1-2tabs every 4-6hours as needed for pain. #30 no refills.  Called into [facility] [phone].  Patient updated at this time of POC and agreeable.

## 2012-05-09 NOTE — Telephone Encounter (Signed)
Angie called from [facility].  She is at the patient's home.  She has orders to pack the wound wet to dry or with nu gauze.  She pulled Iodoform out of the wound.  She has Iodoform there with her.  I told her to use the Iodoform since she has it on hand and I will clarify with Dr Dwain Sarna.  She was confused when she took out the Iodoform.

## 2012-05-10 LAB — CULTURE, ROUTINE-ABSCESS

## 2012-05-18 ENCOUNTER — Ambulatory Visit (INDEPENDENT_AMBULATORY_CARE_PROVIDER_SITE_OTHER): Payer: PRIVATE HEALTH INSURANCE | Admitting: Internal Medicine

## 2012-05-18 ENCOUNTER — Encounter: Payer: Self-pay | Admitting: Internal Medicine

## 2012-05-18 VITALS — BP 118/65 | HR 81 | Temp 97.0°F | Ht 64.0 in | Wt 199.2 lb

## 2012-05-18 DIAGNOSIS — N611 Abscess of the breast and nipple: Secondary | ICD-10-CM

## 2012-05-18 DIAGNOSIS — E119 Type 2 diabetes mellitus without complications: Secondary | ICD-10-CM

## 2012-05-18 DIAGNOSIS — N61 Mastitis without abscess: Secondary | ICD-10-CM

## 2012-05-18 LAB — GLUCOSE, CAPILLARY: Glucose-Capillary: 205 mg/dL — ABNORMAL HIGH (ref 70–99)

## 2012-05-18 NOTE — Patient Instructions (Signed)
General Instructions:  Please follow-up at the clinic in 1 month with your PCP, at which time we will reevaluate your diabetes and blood pressure - OR, please follow-up in the clinic sooner if needed.  There have not been changes in your medications.   Please make sure to go to your appointment with Dr. Dwain Sarna.  Either call him or see him on Friday about Clindamycin (antibiotic)  Start checking your blood sugar in the morning before eating (before breakfast)  If you are diabetic, please bring your meter to your next visit.  If symptoms worsen, or new symptoms arise, please call the clinic or go to the ER.  PLEASE BRING ALL OF YOUR MEDICATIONS  IN A BAG TO YOUR NEXT APPOINTMENT   Treatment Goals:  Goals (1 Years of Data) as of 05/18/12         As of Today 05/08/12 05/07/12 05/07/12 05/07/12     Blood Pressure    . Blood Pressure < 130/80  118/65 155/77 128/57 150/66 161/75     Result Component    . HEMOGLOBIN A1C < 8.0          . LDL CALC < 100            Progress Toward Treatment Goals:    Self Care Goals & Plans:  Self Care Goal 05/18/2012  Manage my medications take my medicines as prescribed; bring my medications to every visit; refill my medications on time  Monitor my health keep track of my blood glucose; bring my glucose meter and log to each visit  Eat healthy foods drink diet soda or water instead of juice or soda; eat more vegetables; eat foods that are low in salt; eat baked foods instead of fried foods

## 2012-05-18 NOTE — Assessment & Plan Note (Signed)
Assessment: Wound culture growing S.pyogenes. Has been treated with Clindamycin TID, with plan for total 10 day course. There was some confusion about duration of therapy, and family planned to continue until seeing Dr. Dwain Sarna. Per DC summary seems 10 days was planned. No new concern for worsening infection, seems to be healing well.  Plan:      Continue Clindamycin per Dr. Dwain Sarna - asked to call his office to confirm planned duration.  Continue wound care.  Make sure to follow-up with Dr. Dwain Sarna on Friday for additional instructions.

## 2012-05-18 NOTE — Progress Notes (Signed)
Patient: [name]   MRN: [id number]  DOB: 10/17/1933  PCP: Almyra Deforest, MD   Subjective:    HPI: [name] is a 79 y.o. female with a PMHx as outlined below, who presented to clinic today for the following:  1) Hospital follow-up - Patient was hospitalized at [facility] from March 21 - 23, 2014 for evaluation of the following:  Left breast cellulitis with abscess - pt was admitted with progressively worsening left breast abscess with drainage x 1 month prior to evaluation. Higher to admission, she failed an outpatient regimen of clindamycin 300 mg 3 times a day x10 days, with progression of process. Left breast ultrasound showed 3.3x1.7x3.3 cm abscess within the inner left breast. [facility] service with consultation and I&D was performed on 05/07/12. The patient was continued on an additional 10 day course of clindamycin. Final wound culture grew S.pyogenes. She has not yet followed up with her surgeon Dr. Dwain Sarna - she is scheduled to see him on Friday.  Not having drainage from the breast, still having pain, but slowly improving. No worsening redness, swelling. No fevers, chills, nausea, vomiting, diarrhea, abd pain. Having RN for wound care 2-3 times a week for wound change.  2) DM2, uncontrolled -  Lab Results  Component Value Date   HGBA1C 10.9 04/29/2012   Patient checking blood sugars 1 time(s) daily typically, after breakfast. Reports postprandial blood sugars of 190-200s mg/dL. Currently taking Lantus 5 units (her daughter is now drawing up the insulin for her and she administers) and metformin 500mg  BID. Has not been able to escalate further because of GI side effects. Patient misses doses 0 x per week on average.  0 hypoglycemic episodes since last visit. Denies assisted hypoglycemia or recently hospitalizations for either hyper or hypoglycemia. denies polyuria, polydipsia, nausea, vomiting, diarrhea.  does not request refills today.   Review of Systems: Per HPI.     Current Outpatient Medications: Medication Sig  . albuterol (PROVENTIL HFA;VENTOLIN HFA) 108 (90 BASE) MCG/ACT inhaler Inhale 2 puffs into the lungs every 4 (four) hours as needed for wheezing.  Marland Kitchen albuterol (PROVENTIL) (2.5 MG/3ML) 0.083% nebulizer solution Take 3 mLs (2.5 mg total) by nebulization every 6 (six) hours as needed for wheezing.  Marland Kitchen aspirin 81 MG tablet Take 81 mg by mouth daily.    . carvedilol (COREG) 25 MG tablet Take 1 tablet (25 mg total) by mouth 2 (two) times daily with a meal.  . citalopram (CELEXA) 20 MG tablet TAKE 1 TABLET (20 MG TOTAL) BY MOUTH DAILY.  . clindamycin (CLEOCIN) 300 MG capsule Take 1 capsule (300 mg total) by mouth 3 (three) times daily.  Graciella Freer Starter Kit MISC 1 kit by Other route once.  . insulin glargine (LANTUS SOLOSTAR) 100 UNIT/ML injection Inject 0.05 mLs (5 Units total) into the skin at bedtime.  Marland Kitchen lisinopril (PRINIVIL,ZESTRIL) 40 MG tablet Take 40 mg by mouth daily.  . metFORMIN (GLUCOPHAGE) 1000 MG tablet Take 500 mg twice daily then slowly increase the dose to 1000 mg twice daily and keep it.  . simvastatin (ZOCOR) 20 MG tablet TAKE 1 TABLET EVERY DAY  . HYDROcodone-acetaminophen (NORCO/VICODIN) 5-325 MG per tablet Take 5-325 tablets by mouth every 6 (six) hours as needed.    Allergies No Known Allergies   Past Medical History  Diagnosis Date  . Diabetes mellitus 2007    HgA1C (02/20/2010) = 9.2, HgA1C (03/20/2009) = 12.1  . Hyperlipidemia   . Hypertension   . GERD (gastroesophageal  reflux disease)   . CVA (cerebrovascular accident) 2006     right embolic stroke, no residual deficits  . Diverticulitis   . CVA (cerebral infarction) 7-yrs ago  . Arthritis   . Dysphagia   . CHF (congestive heart failure)   . DIABETES MELLITUS, TYPE II 12/04/2005  . HYPERLIPIDEMIA 12/04/2005  . HYPERTENSION 12/04/2005  . GERD 12/04/2005  . ARTHRITIS, KNEE 03/24/2006  . Lung mass 07/08/2010  . Meningioma 07/21/2010  . Hemangioma of liver  12/02/2010  . Depression 12/02/2010  . Adenocarcinoma of lung      Right upper lobe adenocarcinoma.     Past Surgical History  Procedure Laterality Date  . Abdominal hysterectomy    . Video bronchoscope.  12/29/2007    [place]  . Wide excision of left upper back mass.    . Extracapsular cataract extraction with intraocular      lens implantation.  . Right vats,right thoracotomy,right lower lobectomy with node dissection       Objective:    Physical Exam: Filed Vitals:   05/18/12 1442  BP: 118/65  Pulse: 81  Temp: 97 F (36.1 C)     General: Vital signs reviewed and noted. Well-developed, well-nourished, in no acute distress; alert, appropriate and cooperative throughout examination.  Head: Normocephalic, atraumatic.  Neck: No deformities, masses, or tenderness noted.  Lungs:  Normal respiratory effort. Clear to auscultation BL without crackles or wheezes.  Heart: RRR. S1 and S2 normal without gallop, murmur, or rubs.  Abdomen:  BS normoactive. Soft, Nondistended, non-tender.  No masses or organomegaly.  Extremities: No pretibial edema.  Left breast - wound dressing clear, dry intact. Approximately 3-5 cm lateral healing surgical incision on medial aspect of left breast. No significant induration, redness or warmth of involved area.     Assessment/ Plan:   The patient's case and plan of care was discussed with attending physician, Dr. Doneen Poisson.

## 2012-05-18 NOTE — Assessment & Plan Note (Signed)
Pertinent Labs: Lab Results  Component Value Date   HGBA1C 10.9 04/29/2012   HGBA1C 6.7* 11/10/2010   CREATININE 0.75 05/08/2012   CREATININE 0.66 05/06/2012   MICROALBUR 2.25* 07/26/2008   MICRALBCREAT 14.2 07/26/2008   CHOL 213* 11/10/2010   HDL 53 11/10/2010   TRIG 65 11/10/2010    Assessment: Disease Control:  Uncontrolled  Progress toward goals:   Seems to be improved from prior - now reporting postprandial blood sugars of 190-200 mg/dL  Barriers to meeting goals: prior medication confusion, pt drawing up insulin inconsistently.    Newly started on insulin again during hospitalization 04/2012.   Patient is compliant most of the time with prescribed medications.   Daughter now drawing up her insulin and getting it regularly.  Has not been able to advance Metformin more than 500mg  BID 2/2 GI side effect.   Plan: Glucometer log was not reviewed today, as pt did not have glucometer available for review.   continue current medications  reminded to get eye exam and reminded to bring blood glucose meter & log to each visit  Educational resources provided: brochure  Self management tools provided:    Home glucose monitoring recommendation:    Start checking blood sugars at least once daily before breakfast.  Continue metformin and slowly escalate if able - if not, can consider to change to the 750mg  formulation to see if she would be able to tolerate.  Requesting eye exam report from Dr. Jacelyn Pi office.

## 2012-05-20 ENCOUNTER — Encounter (INDEPENDENT_AMBULATORY_CARE_PROVIDER_SITE_OTHER): Payer: Self-pay | Admitting: General Surgery

## 2012-05-20 ENCOUNTER — Encounter (INDEPENDENT_AMBULATORY_CARE_PROVIDER_SITE_OTHER): Payer: Self-pay | Admitting: Surgery

## 2012-05-20 ENCOUNTER — Ambulatory Visit (INDEPENDENT_AMBULATORY_CARE_PROVIDER_SITE_OTHER): Payer: PRIVATE HEALTH INSURANCE | Admitting: Surgery

## 2012-05-20 VITALS — BP 130/82 | HR 68 | Temp 98.7°F | Resp 18 | Ht 64.0 in | Wt 199.2 lb

## 2012-05-20 DIAGNOSIS — Z9889 Other specified postprocedural states: Secondary | ICD-10-CM

## 2012-05-20 NOTE — Patient Instructions (Signed)
Stop packing.  Cover with dry dressing.  Return as needed.

## 2012-05-20 NOTE — Progress Notes (Signed)
Patient returns after incision and drainage of left breast abscess. She's doing well.  Exam: Left breast wound clean dry and intact. Measures 1 x 3 cm. 5 mm  Deep   Impression: Status post drainage left breast abscess doing well   Plan: Stop packing wound. Cover with dry dressing. Okay to wash. Return to clinic as needed

## 2012-05-23 ENCOUNTER — Ambulatory Visit: Payer: PRIVATE HEALTH INSURANCE | Attending: Internal Medicine | Admitting: Physical Therapy

## 2012-05-23 NOTE — Addendum Note (Signed)
Addended by: Neomia Dear on: 05/23/2012 08:20 PM   Modules accepted: Orders

## 2012-05-23 NOTE — Progress Notes (Signed)
Case discussed with Dr. Kalia-Reynolds at time of visit. We reviewed the resident's history and exam and pertinent patient test results. I agree with the assessment, diagnosis, and plan of care documented in the resident's note.  

## 2012-06-04 ENCOUNTER — Telehealth: Payer: Self-pay | Admitting: Internal Medicine

## 2012-06-04 NOTE — Telephone Encounter (Signed)
Patient's daughter called requesting pain medicine for her mother Ms Mcclarty. She states that her mother had a cyst removed from her breast and her arm is still hurting her. She states that the surgeon released her already and she did not know who to call. She states she called the internal medicine clinic yesterday and they did not call her back. I informed her that we were closed and do not reopen until Monday. In the meanwhile I advised her to call the surgeon's office. I did give her the number for [facility] [place] [facility]. Advised her that we do not give pain medication on the weekends from the clinic. Advised her if she was unable to get ahold of surgeon she may have to go to urgent care or ED if pain is severe and not alleviating.   Genella Mech 2:53 PM 06/04/2012

## 2012-06-07 ENCOUNTER — Other Ambulatory Visit: Payer: Self-pay | Admitting: *Deleted

## 2012-06-07 ENCOUNTER — Other Ambulatory Visit (INDEPENDENT_AMBULATORY_CARE_PROVIDER_SITE_OTHER): Payer: Self-pay | Admitting: General Surgery

## 2012-06-07 DIAGNOSIS — N611 Abscess of the breast and nipple: Secondary | ICD-10-CM

## 2012-06-07 NOTE — Telephone Encounter (Signed)
Pt's daughter reports pain in both arms, she needs something for pain, states she has been seen for this arm pain in the past

## 2012-06-08 ENCOUNTER — Telehealth: Payer: Self-pay | Admitting: *Deleted

## 2012-06-08 MED ORDER — HYDROCODONE-ACETAMINOPHEN 5-325 MG PO TABS: 5.0000 | ORAL_TABLET | Freq: Three times a day (TID) | ORAL | Status: DC | PRN

## 2012-06-08 NOTE — Telephone Encounter (Signed)
Rx called in 

## 2012-06-08 NOTE — Telephone Encounter (Signed)
Vicodin rx per Dr Loistine Chance faxed to [facility].

## 2012-06-09 ENCOUNTER — Telehealth: Payer: Self-pay | Admitting: Dietician

## 2012-06-09 NOTE — Telephone Encounter (Signed)
Letter faxed to dr brewing ton's office requesting last eye exam report.

## 2012-06-17 ENCOUNTER — Other Ambulatory Visit: Payer: Self-pay | Admitting: Internal Medicine

## 2012-06-20 NOTE — Telephone Encounter (Signed)
Called to pharm 

## 2012-06-23 ENCOUNTER — Telehealth: Payer: Self-pay | Admitting: Dietician

## 2012-06-23 NOTE — Telephone Encounter (Signed)
Per office: Patient has not had eye exam at Dr. Jacelyn Pi office since 2012.

## 2012-06-27 ENCOUNTER — Encounter: Payer: Self-pay | Admitting: Internal Medicine

## 2012-06-27 ENCOUNTER — Ambulatory Visit (INDEPENDENT_AMBULATORY_CARE_PROVIDER_SITE_OTHER): Payer: PRIVATE HEALTH INSURANCE | Admitting: Internal Medicine

## 2012-06-27 VITALS — BP 164/80 | HR 75 | Temp 98.4°F | Ht 64.0 in | Wt 208.5 lb

## 2012-06-27 DIAGNOSIS — N611 Abscess of the breast and nipple: Secondary | ICD-10-CM

## 2012-06-27 DIAGNOSIS — N61 Mastitis without abscess: Secondary | ICD-10-CM

## 2012-06-27 DIAGNOSIS — Z9181 History of falling: Secondary | ICD-10-CM

## 2012-06-27 DIAGNOSIS — I10 Essential (primary) hypertension: Secondary | ICD-10-CM

## 2012-06-27 DIAGNOSIS — M25511 Pain in right shoulder: Secondary | ICD-10-CM

## 2012-06-27 DIAGNOSIS — M25519 Pain in unspecified shoulder: Secondary | ICD-10-CM

## 2012-06-27 DIAGNOSIS — E119 Type 2 diabetes mellitus without complications: Secondary | ICD-10-CM

## 2012-06-27 DIAGNOSIS — F329 Major depressive disorder, single episode, unspecified: Secondary | ICD-10-CM

## 2012-06-27 MED ORDER — HYDROCODONE-ACETAMINOPHEN 5-325 MG PO TABS: 1.0000 | ORAL_TABLET | Freq: Two times a day (BID) | ORAL | Status: DC | PRN

## 2012-06-27 NOTE — Patient Instructions (Signed)
1. Stop taking Celexa 2. Stop taking any stool softeners 3. Continue to take Insulin. Please check your blood sugars daily 4. Please bring all your medication and your meter with you during the next office visit

## 2012-06-27 NOTE — Progress Notes (Signed)
Copy of Pain Contract given to pt. 

## 2012-06-27 NOTE — Progress Notes (Signed)
Subjective:   Patient ID: [name] female   DOB: [birth year] 79 y.o.   MRN: [id number]  HPI: [name] is a 79 y.o. female with past medical history significant as outlined below who presented to the clinic for a followup. Patient's main concern today is that she continues to have right shoulder and knee pain. Otherwise patient is feeling pretty good and just need refill of her Vicodin.   Past Medical History  Diagnosis Date  . Diabetes mellitus 2007    HgA1C (02/20/2010) = 9.2, HgA1C (03/20/2009) = 12.1  . Hyperlipidemia   . Hypertension   . GERD (gastroesophageal reflux disease)   . CVA (cerebrovascular accident) 2006     right embolic stroke, no residual deficits  . Diverticulitis   . CVA (cerebral infarction) 7-yrs ago  . Arthritis   . Dysphagia   . CHF (congestive heart failure)   . DIABETES MELLITUS, TYPE II 12/04/2005  . HYPERLIPIDEMIA 12/04/2005  . HYPERTENSION 12/04/2005  . GERD 12/04/2005  . ARTHRITIS, KNEE 03/24/2006  . Lung mass 07/08/2010  . Meningioma 07/21/2010  . Hemangioma of liver 12/02/2010  . Depression 12/02/2010  . Adenocarcinoma of lung      Right upper lobe adenocarcinoma.    Current Outpatient Prescriptions  Medication Sig Dispense Refill  . albuterol (PROVENTIL HFA;VENTOLIN HFA) 108 (90 BASE) MCG/ACT inhaler Inhale 2 puffs into the lungs every 4 (four) hours as needed for wheezing.  1 Inhaler  3  . albuterol (PROVENTIL) (2.5 MG/3ML) 0.083% nebulizer solution Take 3 mLs (2.5 mg total) by nebulization every 6 (six) hours as needed for wheezing.  75 mL  12  . aspirin 81 MG tablet Take 81 mg by mouth daily.        . carvedilol (COREG) 25 MG tablet Take 1 tablet (25 mg total) by mouth 2 (two) times daily with a meal.  60 tablet  0  . citalopram (CELEXA) 20 MG tablet TAKE 1 TABLET (20 MG TOTAL) BY MOUTH DAILY.  30 tablet  1  . clindamycin (CLEOCIN) 300 MG capsule Take 1 capsule (300 mg total) by mouth 3 (three) times daily.  30 capsule  0  .  Flexpen Starter Kit MISC 1 kit by Other route once.  1 each  0  . HYDROcodone-acetaminophen (NORCO/VICODIN) 5-325 MG per tablet Take 1 tablet by mouth 2 (two) times daily as needed for pain.  30 tablet  0  . insulin glargine (LANTUS SOLOSTAR) 100 UNIT/ML injection Inject 0.05 mLs (5 Units total) into the skin at bedtime.  10 mL  3  . lisinopril (PRINIVIL,ZESTRIL) 40 MG tablet Take 40 mg by mouth daily.      . metFORMIN (GLUCOPHAGE) 1000 MG tablet Please take metformin with food, 250 mg twice daily for 7 days, then increase the dose to 500 mg twice daily for another 7 days, then increase the dose to 1000 mg twice daily and keep it.  120 tablet  3  . simvastatin (ZOCOR) 20 MG tablet TAKE 1 TABLET BY MOUTH EVERY DAY  90 tablet  0   No current facility-administered medications for this visit.   Family History  Problem Relation Age of Onset  . Hyperlipidemia Brother   . Hypertension Brother   . Diabetes Brother    History   Social History  . Marital Status: Widowed    Spouse Name: N/A    Number of Children: N/A  . Years of Education: N/A   Social History Main Topics  . Smoking status: Former  Smoker    Types: Cigarettes    Quit date: 02/17/2000  . Smokeless tobacco: Never Used  . Alcohol Use: No  . Drug Use: No  . Sexually Active: None   Other Topics Concern  . None   Social History Narrative   Patient requests to use [facility] for her diabets testing supplies as of 09/05/2009.   Review of Systems: Constitutional: Denies fever, chills, diaphoresis, appetite change and fatigue. Marland Kitchen   Respiratory: Denies SOB, DOE, cough, chest tightness,  and wheezing.   Cardiovascular: Denies chest pain, palpitations and leg swelling.  Gastrointestinal: Denies nausea, vomiting, abdominal pain, diarrhea, constipation, blood in stool and abdominal distention.  Genitourinary: Denies dysuria, urgency, frequency, hematuria, flank pain and difficulty urinating.  Skin: Denies pallor, rash and  wound.  Neurological: Denies dizziness,  syncope, weakness, light-headedness, numbness and headaches. denies any falls    Objective:  Physical Exam: Filed Vitals:   06/27/12 1506  BP: 159/81  Pulse: 78  Temp: 98.4 F (36.9 C)  TempSrc: Oral  Height: 5\' 4"  (1.626 m)  Weight: 208 lb 8 oz (94.575 kg)  SpO2: 98%   Constitutional: Vital signs reviewed.  Patient is a well-developed and well-nourished female in no acute distress and cooperative with exam. Alert and oriented x3.  Eyes: PERRL, EOMI, conjunctivae normal, No scleral icterus.  Neck: Supple,  Cardiovascular: RRR, S1 normal, S2 normal, no MRG, pulses symmetric and intact bilaterally Pulmonary/Chest: CTAB, no wheezes, rales, or rhonchi Abdominal: Soft. Non-tender, non-distended, bowel sounds are normal,  Musculoskeletal: Right shoulder: Tender to palpation anteriorly decreased range of motion do to pain. No joint deformities, erythema. Knee : Mildly tender to palpation. Crepitus present. Normal range of motion. No erythema or joint deformities present.  Hematology: no cervical, adenopathy.  Neurological: A&O x3, Strength is normal and symmetric bilaterally, cranial nerve II-XII are grossly intact, no focal motor deficit, sensory intact to light touch bilaterally.  Skin: Warm, dry and intact. No rash, cyanosis, or clubbing.

## 2012-06-28 LAB — PRESCRIPTION ABUSE MONITORING 15P, URINE
Cocaine Metabolites: NEGATIVE ng/mL
Creatinine, Urine: 95.58 mg/dL (ref >20.0)
Methadone Screen, Urine: NEGATIVE ng/mL
Oxycodone Screen, Ur: NEGATIVE ng/mL
Propoxyphene: NEGATIVE ng/mL

## 2012-06-28 NOTE — Assessment & Plan Note (Signed)
Medical noncompliance is most likely the issue. After calling the pharmacy it was noted that the patient lost filled her Lantus on 05/08/12. Her daughter is now all taken care off providing her medication. She did not bring her meter with her. I would not make any further changes at this point. I will reeval patient in one month for possible changes in management.

## 2012-06-28 NOTE — Assessment & Plan Note (Signed)
Patient had history of depression was on Celexa. Patient's mood has significantly improved and I will give a trial without Celexa.

## 2012-06-28 NOTE — Assessment & Plan Note (Signed)
No notable signs of infection. Well healing wound appear

## 2012-06-28 NOTE — Assessment & Plan Note (Signed)
Blood pressure is mildly elevated. I believe this is related to medical noncompliance. I called the pharmacy and got the following information: Coreg: 25 mg bid on 06/17/12 before it was in Jan Lisinopril 40 mg 06/17/12 before that it was in Jan  At this point I'll continue current regimen and reevaluate patient in the next office visit

## 2012-06-28 NOTE — Assessment & Plan Note (Signed)
Patient has chronic shoulder pain on the right. Patient had been about by sports medicine in the past she receives steroid injections give her some relief. I'll refer patient back to sports medicine for further evaluation and management. I will prescribe Vicodin 5/325 mg by mouth twice a day when necessary for pain 60 tablets for one month. Obtain a urine drug screen today. The pain contract was obtained today

## 2012-06-29 NOTE — Progress Notes (Signed)
INTERNAL MEDICINE TEACHING ATTENDING ADDENDUM: I discussed this case with Dr. Illath soon after the patient visit. I have read the documentation and I agree with the plan of care. Please see the resident note for details of management.  

## 2012-06-30 LAB — BENZODIAZEPINES (GC/LC/MS), URINE
Clonazepam metabolite (GC/LC/MS), ur confirm: NEGATIVE ng/mL
Flunitrazepam metabolite (GC/LC/MS), ur confirm: NEGATIVE ng/mL
Lorazepam (GC/LC/MS), ur confirm: NEGATIVE ng/mL
Nordiazepam (GC/LC/MS), ur confirm: NEGATIVE ng/mL
Temazepam (GC/LC/MS), ur confirm: NEGATIVE ng/mL

## 2012-06-30 LAB — OPIATES/OPIOIDS (LC/MS-MS)
Codeine Urine: NEGATIVE ng/mL
Heroin (6-AM), UR: NEGATIVE ng/mL
Hydrocodone: 191 ng/mL
Oxymorphone: NEGATIVE ng/mL

## 2012-07-04 ENCOUNTER — Ambulatory Visit (INDEPENDENT_AMBULATORY_CARE_PROVIDER_SITE_OTHER): Payer: PRIVATE HEALTH INSURANCE | Admitting: Family Medicine

## 2012-07-04 VITALS — BP 189/97 | Ht 64.0 in | Wt 200.0 lb

## 2012-07-04 DIAGNOSIS — M7501 Adhesive capsulitis of right shoulder: Secondary | ICD-10-CM

## 2012-07-04 DIAGNOSIS — M25519 Pain in unspecified shoulder: Secondary | ICD-10-CM

## 2012-07-04 DIAGNOSIS — M7500 Adhesive capsulitis of unspecified shoulder: Secondary | ICD-10-CM

## 2012-07-04 DIAGNOSIS — M25511 Pain in right shoulder: Secondary | ICD-10-CM

## 2012-07-05 NOTE — Progress Notes (Signed)
Patient ID: [name], female   DOB: Feb 01, 1934, 79 y.o.   MRN: [id number] Right shoulder pain for greater than 6 months. Has been seen for it before and had corticosteroid injections which did not seem to help much. She is left-hand dominant. No specific injury. Pain is deep in the joint, aching, 8-9 out of 10 at night and keeps her awake. Difficult to reach forward and grab anything with that arm. No numbness or tingling in the hand. SUBJECTIVE: Well-developed female no acute distress Shoulder: Right. Limited range of motion in Rotation and supraspinatus testing she can only get about 120. Biceps tendon is intact. Rotator cuff musculature appears to be intact. Distally she is neurovascularly intact. ULTRASOUND: Rotator cuff reveals a lot of calcifications in the supraspinatus and subscapularis muscle but no tear. The a.c. joint is quite arthritic. ASSESSMENT: One. Right shoulder pain. I think she has chronic impingement and she's developing adhesive capsulitis. PLAN: Discussed options. I think the glenohumeral injection might help her but she would rather try home exercise program first. We gave her Theraband today and we'll see her back in 4-6 weeks. At that time we can reconsider whether or not she wants the glenohumeral joint injection. In the interim I will get some shoulder x-rays because we don't have any recent labs.

## 2012-07-13 ENCOUNTER — Other Ambulatory Visit: Payer: Self-pay | Admitting: Internal Medicine

## 2012-07-13 ENCOUNTER — Telehealth: Payer: Self-pay | Admitting: Licensed Clinical Social Worker

## 2012-07-13 NOTE — Telephone Encounter (Signed)
CSW returned call to [place].  Tyneesha inquiring if PCS request had been faxed to [facility].  CSW questioned for pt's DOB, unable to provide correct DOB.  CSW informed Abran Richard to have Ms. Brake provide [facility] with all insurance information.  And, informed Abran Richard no other information can be provided over the phone without ROI.  Ms. Conway current EMR does not reflect pt as having Medicaid number.  CSW will no longer accept call from this person unless ROI is provided.

## 2012-07-21 ENCOUNTER — Encounter: Payer: Self-pay | Admitting: Internal Medicine

## 2012-08-01 ENCOUNTER — Encounter: Payer: PRIVATE HEALTH INSURANCE | Admitting: Internal Medicine

## 2012-08-08 ENCOUNTER — Ambulatory Visit: Payer: PRIVATE HEALTH INSURANCE | Admitting: Family Medicine

## 2012-08-16 ENCOUNTER — Other Ambulatory Visit: Payer: Self-pay | Admitting: Dietician

## 2012-08-16 DIAGNOSIS — E119 Type 2 diabetes mellitus without complications: Secondary | ICD-10-CM

## 2012-08-16 MED ORDER — FREESTYLE FREEDOM LITE W/DEVICE KIT: PACK | Status: DC

## 2012-08-16 MED ORDER — GLUCOSE BLOOD VI STRP: ORAL_STRIP | Status: DC

## 2012-08-16 MED ORDER — CVS LANCETS MICRO THIN 33G MISC: Status: DC

## 2012-08-16 NOTE — Telephone Encounter (Signed)
Request rx for new meter and supplies

## 2012-08-25 ENCOUNTER — Other Ambulatory Visit: Payer: Self-pay

## 2012-09-06 ENCOUNTER — Encounter: Payer: Self-pay | Admitting: Internal Medicine

## 2012-09-06 ENCOUNTER — Ambulatory Visit (INDEPENDENT_AMBULATORY_CARE_PROVIDER_SITE_OTHER): Payer: PRIVATE HEALTH INSURANCE | Admitting: Internal Medicine

## 2012-09-06 VITALS — BP 142/73 | HR 77 | Temp 98.1°F | Wt 204.4 lb

## 2012-09-06 DIAGNOSIS — M25511 Pain in right shoulder: Secondary | ICD-10-CM

## 2012-09-06 DIAGNOSIS — M25519 Pain in unspecified shoulder: Secondary | ICD-10-CM

## 2012-09-06 DIAGNOSIS — K219 Gastro-esophageal reflux disease without esophagitis: Secondary | ICD-10-CM

## 2012-09-06 DIAGNOSIS — I10 Essential (primary) hypertension: Secondary | ICD-10-CM

## 2012-09-06 DIAGNOSIS — E119 Type 2 diabetes mellitus without complications: Secondary | ICD-10-CM

## 2012-09-06 LAB — GLUCOSE, CAPILLARY

## 2012-09-06 LAB — POCT GLYCOSYLATED HEMOGLOBIN (HGB A1C): Hemoglobin A1C: 10.8

## 2012-09-06 MED ORDER — HYDROCODONE-ACETAMINOPHEN 5-325 MG PO TABS: 1.0000 | ORAL_TABLET | Freq: Two times a day (BID) | ORAL | Status: DC | PRN

## 2012-09-06 MED ORDER — PANTOPRAZOLE SODIUM 20 MG PO TBEC: 20.0000 mg | DELAYED_RELEASE_TABLET | Freq: Every day | ORAL | Status: DC

## 2012-09-06 MED ORDER — INSULIN ASPART 100 UNIT/ML ~~LOC~~ SOLN
5.0000 [IU] | Freq: Once | SUBCUTANEOUS | Status: AC
  Administered 2012-09-06: 5 [IU] via SUBCUTANEOUS

## 2012-09-06 MED ORDER — INSULIN GLARGINE 100 UNIT/ML ~~LOC~~ SOLN: 10.0000 [IU] | Freq: Every day | SUBCUTANEOUS | Status: DC

## 2012-09-06 NOTE — Progress Notes (Addendum)
Subjective:   Patient ID: [name] female   DOB: 1933-10-26 79 y.o.   MRN: [id number]  HPI: [name] is a 79 y.o. female with extensive PMH notable for DM2, HTN, CVA, CHF, and lung adenocarcinoma presenting to [facility] today for routine follow up visit.  She was last seen by Dr. Loistine Chance on 06/2012 for right shoulder pain and subsequently seen by sports medicine later that month thought to have ?adhesive capsulitis.  She is trying home exercise programs first in place of glenohumeral injections that have helped and she plans to continue do to so.    She does not have any major complaints today.  She says she continues to feel tired and have body aches but that is not new.  She admits to non-adherence to her insulin at times and did not take any lantus last night and is only tolerating 500mg  metformin bid.  Her daughter is present in the room as well.  She has been unable to check her sugars for several weeks now as she lost her meter in the move and is unable to get a new one.  Apparently they received strips via insurance but they do not recall this, and thus are unable to get new strips until august.  We have given them sample strips for now and increased lantus due to poor control and will have diabetes educator follow up as well.  She needs more insight into her diabetes and glycemic control.    She does report mild acid reflux after eating at times and says no relief with zantac or tums before but says a little bit of baking soda helps her.    Past Medical History  Diagnosis Date  . Diabetes mellitus 2007    HgA1C (02/20/2010) = 9.2, HgA1C (03/20/2009) = 12.1  . Hyperlipidemia   . Hypertension   . GERD (gastroesophageal reflux disease)   . CVA (cerebrovascular accident) 2006     right embolic stroke, no residual deficits  . Diverticulitis   . CVA (cerebral infarction) 7-yrs ago  . Arthritis   . Dysphagia   . CHF (congestive heart failure)   . DIABETES MELLITUS, TYPE II  12/04/2005  . HYPERLIPIDEMIA 12/04/2005  . HYPERTENSION 12/04/2005  . GERD 12/04/2005  . ARTHRITIS, KNEE 03/24/2006  . Lung mass 07/08/2010  . Meningioma 07/21/2010  . Hemangioma of liver 12/02/2010  . Depression 12/02/2010  . Adenocarcinoma of lung      Right upper lobe adenocarcinoma.    Current Outpatient Prescriptions  Medication Sig Dispense Refill  . albuterol (PROVENTIL HFA;VENTOLIN HFA) 108 (90 BASE) MCG/ACT inhaler Inhale 2 puffs into the lungs every 4 (four) hours as needed for wheezing.  1 Inhaler  3  . albuterol (PROVENTIL) (2.5 MG/3ML) 0.083% nebulizer solution Take 3 mLs (2.5 mg total) by nebulization every 6 (six) hours as needed for wheezing.  75 mL  12  . aspirin 81 MG tablet Take 81 mg by mouth daily.        . Blood Glucose Monitoring Suppl (FREESTYLE FREEDOM LITE) W/DEVICE KIT Check blood sugar 3 times a day before meals dx code 250.00 insulin requiring  1 each  0  . carvedilol (COREG) 25 MG tablet Take 1 tablet (25 mg total) by mouth 2 (two) times daily with a meal.  60 tablet  0  . clindamycin (CLEOCIN) 300 MG capsule Take 1 capsule (300 mg total) by mouth 3 (three) times daily.  30 capsule  0  . [facility] LANCETS MICRO THIN  33G MISC Check blood sugar 3 times a day before meals dx code 250.00 insulin requiring  100 each  12  . Flexpen Starter Kit MISC 1 kit by Other route once.  1 each  0  . glucose blood (FREESTYLE LITE) test strip Check blood sugar 3 times a day before meals dx code 250.00 insulin requiring  100 each  12  . HYDROcodone-acetaminophen (NORCO/VICODIN) 5-325 MG per tablet Take 1 tablet by mouth 2 (two) times daily as needed for pain.  60 tablet  0  . insulin glargine (LANTUS SOLOSTAR) 100 UNIT/ML injection Inject 0.05 mLs (5 Units total) into the skin at bedtime.  10 mL  3  . lisinopril (PRINIVIL,ZESTRIL) 40 MG tablet Take 40 mg by mouth daily.      . metFORMIN (GLUCOPHAGE) 1000 MG tablet Please take metformin with food, 250 mg twice daily for 7 days, then increase  the dose to 500 mg twice daily for another 7 days, then increase the dose to 1000 mg twice daily and keep it.  120 tablet  3  . simvastatin (ZOCOR) 20 MG tablet TAKE 1 TABLET BY MOUTH EVERY DAY  90 tablet  0   No current facility-administered medications for this visit.   Family History  Problem Relation Age of Onset  . Hyperlipidemia Brother   . Hypertension Brother   . Diabetes Brother    History   Social History  . Marital Status: Widowed    Spouse Name: N/A    Number of Children: N/A  . Years of Education: N/A   Social History Main Topics  . Smoking status: Former Smoker    Types: Cigarettes    Quit date: 02/17/2000  . Smokeless tobacco: Never Used  . Alcohol Use: No  . Drug Use: No  . Sexually Active: Not on file   Other Topics Concern  . Not on file   Social History Narrative   Patient requests to use [facility] for her diabets testing supplies as of 09/05/2009.   Review of Systems:  Constitutional:  Fatigue.  Denies fever, chills, diaphoresis, appetite change.  HEENT:  Denies congestion, sore throat, rhinorrhea, sneezing, mouth sores, trouble swallowing, neck pain   Respiratory:  Denies SOB, DOE, cough, and wheezing.   Cardiovascular:  Denies chest pain, palpitations, and leg swelling.   Gastrointestinal:  Heart burn.  Denies nausea, vomiting, abdominal pain, diarrhea, constipation, blood in stool and abdominal distention.   Genitourinary:  Denies dysuria, urgency, frequency, hematuria, flank pain and difficulty urinating.   Musculoskeletal:  R shoulder pain and b/l knee pain, sitting in wheelchair.  Denies myalgias, back pain.    Skin:  Denies pallor, rash and wound.   Neurological:  Headache.  Denies dizziness, seizures, syncope, weakness, light-headedness, numbness.   Objective:  Physical Exam: Filed Vitals:   09/06/12 1433  BP: 142/73  Pulse: 77  Temp: 98.1 F (36.7 C)  TempSrc: Oral  Weight: 204 lb 6.4 oz (92.715 kg)  SpO2: 100%   Vitals  reviewed. General: sitting in chair, NAD HEENT: PERRL, EOMI, no scleral icterus, left eye lower than right s/p CVA Cardiac: RRR Pulm: clear to auscultation bilaterally, no wheezes, rales, or rhonchi Abd: soft, nontender, nondistended, BS present Ext: warm and well perfused, no pedal edema, +2DP B/L, R shoulder pain and b/l knee pain Neuro: alert and oriented X3, cranial nerves II-XII grossly intact, strength RUE 4/5 compared to left and sensation to light touch equal in bilateral upper and lower extremities  Assessment &  Plan:  Discussed with Dr. Dalphine Handing Poorly controlled DM2: novolog 5 units x1, CBG improved to 400s. Will take lantus when she gets home

## 2012-09-06 NOTE — Patient Instructions (Signed)
General Instructions:  Please increase your lantus dose to 10 units every night and start checking your blood sugars again.  We will follow up with you as well.  Make sure to monitor yourself for low blood sugars <80 and if you start having headaches, nausea, vomiting, shaking, dizziness, sweating, shortness of breath, worsening fatigue, vision changes call the clinic right away [phone]  Please continue to follow up with sports medicine and your exercise activities.   Treatment Goals:  Goals (1 Years of Data) as of 09/06/12         As of Today 07/04/12 06/27/12 06/27/12 05/20/12     Blood Pressure    . Blood Pressure < 130/80  142/73 189/97 164/80 159/81 130/82     Result Component    . HEMOGLOBIN A1C < 8.0  10.8        . LDL CALC < 100            Progress Toward Treatment Goals:  Treatment Goal 09/06/2012  Hemoglobin A1C deteriorated  Blood pressure at goal    Self Care Goals & Plans:  Self Care Goal 09/06/2012  Manage my medications take my medicines as prescribed  Monitor my health keep track of my blood glucose; bring my glucose meter and log to each visit  Eat healthy foods -    Home Blood Glucose Monitoring 09/06/2012  Check my blood sugar 3 times a day  When to check my blood sugar before meals     Care Management & Community Referrals:

## 2012-09-08 MED ORDER — INSULIN GLARGINE 100 UNIT/ML SOLOSTAR PEN: 10.0000 [IU] | PEN_INJECTOR | Freq: Every day | SUBCUTANEOUS | Status: DC

## 2012-09-08 NOTE — Assessment & Plan Note (Signed)
BP Readings from Last 3 Encounters:  09/06/12 142/73  07/04/12 189/97  06/27/12 164/80   Lab Results  Component Value Date   NA 139 05/08/2012   K 4.2 05/08/2012   CREATININE 0.75 05/08/2012    Assessment: Blood pressure control: controlled Progress toward BP goal:  at goal  Plan: Medications: continue current medications coreg and lisinopril

## 2012-09-08 NOTE — Addendum Note (Signed)
Addended by: Baltazar Apo on: 09/08/2012 07:25 PM   Modules accepted: Orders, Medications

## 2012-09-08 NOTE — Assessment & Plan Note (Addendum)
Lab Results  Component Value Date   HGBA1C 10.8 09/06/2012   HGBA1C 10.9 04/29/2012   HGBA1C 7.9 09/07/2011    Assessment: Diabetes control: poor control (HgbA1C >9%) Progress toward A1C goal:  deteriorated Comments: non-adherent with medication, did not take lantus last night.  Lost kit and supplies in move and apparently cannot get supplies per insurance until august.  Kit reordered last week and should be ready for pick up, given sample of strips in [facility] today. Urged adherence and rechecking of sugars again daily  Plan: Medications:  Continue metformin and increase lantus to 10 units  Home glucose monitoring: Frequency: 3 times a day Timing: before meals Instruction/counseling given: reminded to bring meter to each visit, check three times a day with meals, get eyes checked Educational resources provided:   Self management tools provided:   Other plans: will need to follow up with donna plyler as well  Addendum: contacted by pharmacist on 09/08/12: to change lantus order to solostar per patient's request.

## 2012-09-12 ENCOUNTER — Telehealth: Payer: Self-pay | Admitting: Dietician

## 2012-09-12 NOTE — Progress Notes (Signed)
Case discussed with Dr. Qureshi at the time of the visit.  We reviewed the resident's history and exam and pertinent patient test results.  I agree with the assessment, diagnosis, and plan of care documented in the resident's note. 

## 2012-09-12 NOTE — Telephone Encounter (Signed)
She needs strips, we provided samples on last visit but she cant get the next ones until august per insurance i believe.   What do you suggest? i am okay with insulin change as needed

## 2012-09-12 NOTE — Telephone Encounter (Signed)
Calling to follow up on blood sugars after a basal insulin change: daughter reports that her mother's Lowest blood sugar for the past 10 days has been  271, mostly in the 400s She has been taking 5 units twice daily. Her blood sugar was 373 last night and 402 this morning fasting. They are out of strips. Instructed patient Ran out of strips. Discussed with daughter that we usually want to increase insulin total by 8 units, but with her mother not having strips- to hold off until her doctor has had a chance to review this note.

## 2012-09-13 NOTE — Telephone Encounter (Signed)
Called and spoke to [place] to tell her to have her mother change her insulin dose to 9 Units lantus twice daily. CDE will leave 10 more strips at front desk so they can monitor change. suggested they cut back to checking 1x/day to spread strips out until 09/27/12 when they can refill her strips at the pharmacy.

## 2012-09-13 NOTE — Telephone Encounter (Signed)
i agree with change and frequency of checks.  Please follow up via telephone as well, hopefully that will give her more control  Really appreciate you following [name]

## 2012-09-21 ENCOUNTER — Other Ambulatory Visit: Payer: Self-pay | Admitting: Internal Medicine

## 2012-10-04 ENCOUNTER — Telehealth: Payer: Self-pay | Admitting: Dietician

## 2012-10-04 NOTE — Telephone Encounter (Addendum)
[name] says they are still Not able to check, now [facility] is telling them it will be 24th of august.  She requests assistance with strips and Medicaid. She was given social work's phone number to call about the Medicaid. CDE will try to assist her with the test strips. Request providers not sign any new order for mail order diabetes testing supplies at this time.

## 2012-10-05 ENCOUNTER — Other Ambulatory Visit: Payer: Self-pay | Admitting: Internal Medicine

## 2012-10-05 MED ORDER — HYDROCODONE-ACETAMINOPHEN 5-325 MG PO TABS: ORAL_TABLET | ORAL | Status: DC

## 2012-10-05 NOTE — Telephone Encounter (Signed)
Rx called in for 3 months starting on 8/22, 9/22 and 10/22

## 2012-10-05 NOTE — Telephone Encounter (Signed)
Called [facility] mail order ( order dated 06/2011) several times to see if mail order still billing for strips but could not get through. The Theressa Stamps order should have expired in May 2014. [facility] and their computer is now saying she cannot refill her strips until 10/24/12. [facility] said they'd call her insurance to see what is going on and call Ms. Holzer's  Family.

## 2012-11-19 ENCOUNTER — Other Ambulatory Visit: Payer: Self-pay | Admitting: Internal Medicine

## 2012-11-29 ENCOUNTER — Other Ambulatory Visit: Payer: Self-pay | Admitting: Internal Medicine

## 2012-11-29 DIAGNOSIS — M25511 Pain in right shoulder: Secondary | ICD-10-CM

## 2012-11-29 MED ORDER — HYDROCODONE-ACETAMINOPHEN 5-325 MG PO TABS: ORAL_TABLET | ORAL | Status: DC

## 2012-12-07 ENCOUNTER — Telehealth: Payer: Self-pay | Admitting: *Deleted

## 2012-12-07 ENCOUNTER — Other Ambulatory Visit: Payer: Self-pay | Admitting: Internal Medicine

## 2012-12-07 DIAGNOSIS — E119 Type 2 diabetes mellitus without complications: Secondary | ICD-10-CM

## 2012-12-07 NOTE — Telephone Encounter (Signed)
[name],  Can you please help me order the right ones for this patient?  Thanks!  Dr q

## 2012-12-07 NOTE — Telephone Encounter (Signed)
Fax from [facility] - pt's insurance does not cover Freestyle Lite test strips. Preferred are : Prodigy, Roche, or Nipro - need new rx. thanks

## 2012-12-08 MED ORDER — ACCU-CHEK SOFTCLIX LANCET DEV MISC: Status: DC

## 2012-12-08 MED ORDER — GLUCOSE BLOOD VI STRP: ORAL_STRIP | Status: DC

## 2012-12-08 MED ORDER — ACCU-CHEK AVIVA PLUS W/DEVICE KIT: 1.0000 | PACK | Freq: Three times a day (TID) | Status: DC

## 2012-12-23 ENCOUNTER — Ambulatory Visit (INDEPENDENT_AMBULATORY_CARE_PROVIDER_SITE_OTHER): Payer: Medicare Other | Admitting: Internal Medicine

## 2012-12-23 ENCOUNTER — Encounter: Payer: Self-pay | Admitting: Internal Medicine

## 2012-12-23 ENCOUNTER — Ambulatory Visit: Payer: PRIVATE HEALTH INSURANCE | Admitting: Internal Medicine

## 2012-12-23 ENCOUNTER — Telehealth: Payer: Self-pay | Admitting: Dietician

## 2012-12-23 VITALS — BP 130/80 | HR 70 | Temp 97.5°F | Ht 64.0 in | Wt 205.8 lb

## 2012-12-23 DIAGNOSIS — N390 Urinary tract infection, site not specified: Secondary | ICD-10-CM | POA: Diagnosis not present

## 2012-12-23 DIAGNOSIS — E119 Type 2 diabetes mellitus without complications: Secondary | ICD-10-CM

## 2012-12-23 DIAGNOSIS — R21 Rash and other nonspecific skin eruption: Secondary | ICD-10-CM

## 2012-12-23 LAB — POCT GLYCOSYLATED HEMOGLOBIN (HGB A1C): Hemoglobin A1C: 12.1

## 2012-12-23 LAB — GLUCOSE, CAPILLARY: Glucose-Capillary: 349 mg/dL — ABNORMAL HIGH (ref 70–99)

## 2012-12-23 MED ORDER — CIPROFLOXACIN HCL 500 MG PO TABS: 500.0000 mg | ORAL_TABLET | Freq: Two times a day (BID) | ORAL | Status: DC

## 2012-12-23 MED ORDER — LORATADINE 10 MG PO CAPS: 10.0000 mg | ORAL_CAPSULE | Freq: Every day | ORAL | Status: DC

## 2012-12-23 MED ORDER — TRIAMCINOLONE ACETONIDE 0.1 % EX OINT: 1.0000 "application " | TOPICAL_OINTMENT | Freq: Two times a day (BID) | CUTANEOUS | Status: DC

## 2012-12-23 NOTE — Assessment & Plan Note (Signed)
Assessment - Complaints of frequency in a pt with uncontrolled DM. Urinalysis done in the clinic- showed   Leukocytes- Trace Nitrites       - positive Glucose     - >1000 SG             - 1.020. Ph              - 5.5 Ketones    - Neg.  Plan- Will treat as complicated UTI with Ciprofloxacin 500mg  BID for 7 days. - Urine culture.

## 2012-12-23 NOTE — Patient Instructions (Signed)
We will like you to increase the dose of lantus you are taking to 15u, starting today. Your HBA1c is really high- 12.1. Please continue taking your metformin also. Also talk to our diabetis co-ordinator about your glucometer strips. We will also be prescribing a medication called Ciprofloxacin to treat the infection in your bladder. Also we prescribed an ointment for you to apply to your skin rash, is there is no improvement of it gets worse, we will send you to a skin doctor.

## 2012-12-23 NOTE — Telephone Encounter (Addendum)
Patient without strips to check her blood sugar for months. [facility] to find out what happened to new rx sent on 12-07-12.  Pharmacy needed a copy of patient's Medicare A & B card ( Red, white and blue card) . Patient and daughter Etheleen Sia informed.

## 2012-12-23 NOTE — Assessment & Plan Note (Signed)
Assessment- Lab Results  Component Value Date   HGBA1C 12.1 12/23/2012    Assessment-  Diabetes control: poor control (HgbA1C >9%)  Progress toward A1C goal: deteriorated  Comments: patient reports that she has been very compliant with her Lantus and metformin. Pt still has no glucometer strips because of a complication with her insurance not covering the particular strips pts glucometer uses. Currently on Lantus 10u  And metformin 1000u BID.  Plan- Increase Lantus to 15u today. - Diabetis co-ordinator involved in resolving the issue with the test strips.

## 2012-12-23 NOTE — Progress Notes (Addendum)
Patient ID: [name], female   DOB: Dec 22, 1933, 79 y.o.   MRN: [id number]   Subjective:   Patient ID: [name] female   DOB: January 25, 1934 79 y.o.   MRN: [id number]  HPI: [name] is a 79 y.o. with PMH of HTN. Systolic CHF, RUl adenoca of the lung, GERD, DM2, CVA, HLD, depression. Presented today with c/o of rash on her body and urinary frequency.  Patient says rash started a week ago, on different parts of her body, her arms, leg, shoulder, mildly itchy. No recent use of new drugs, no change in body creams, no hx of allergies, no previous hx of similar rash, no family member with similar rash.   Patient also noticed urinary frequency over the past week, says she starts to pass urine on herself before she gets to the bathroom. No assoc dysuria, abdominal pain, back pain or fever. She also complains that her groin region and vulva region itch, but no hx of abnormal vaginal discharge.   Past Medical History  Diagnosis Date  . Diabetes mellitus 2007    HgA1C (02/20/2010) = 9.2, HgA1C (03/20/2009) = 12.1  . Hyperlipidemia   . Hypertension   . GERD (gastroesophageal reflux disease)   . CVA (cerebrovascular accident) 2006     right embolic stroke, no residual deficits  . Diverticulitis   . CVA (cerebral infarction) 7-yrs ago  . Arthritis   . Dysphagia   . CHF (congestive heart failure)   . DIABETES MELLITUS, TYPE II 12/04/2005  . HYPERLIPIDEMIA 12/04/2005  . HYPERTENSION 12/04/2005  . GERD 12/04/2005  . ARTHRITIS, KNEE 03/24/2006  . Lung mass 07/08/2010  . Meningioma 07/21/2010  . Hemangioma of liver 12/02/2010  . Depression 12/02/2010  . Adenocarcinoma of lung      Right upper lobe adenocarcinoma.    Current Outpatient Prescriptions  Medication Sig Dispense Refill  . albuterol (PROVENTIL HFA;VENTOLIN HFA) 108 (90 BASE) MCG/ACT inhaler Inhale 2 puffs into the lungs every 4 (four) hours as needed for wheezing.  1 Inhaler  3  . albuterol (PROVENTIL) (2.5 MG/3ML) 0.083%  nebulizer solution Take 3 mLs (2.5 mg total) by nebulization every 6 (six) hours as needed for wheezing.  75 mL  12  . aspirin 81 MG tablet Take 81 mg by mouth daily.        . Blood Glucose Monitoring Suppl (ACCU-CHEK AVIVA PLUS) W/DEVICE KIT 1 each by Does not apply route 3 (three) times daily. dx code 250.00 insulin requiring  1 kit  1  . carvedilol (COREG) 25 MG tablet Take 1 tablet (25 mg total) by mouth 2 (two) times daily with a meal.  60 tablet  0  . ciprofloxacin (CIPRO) 500 MG tablet Take 1 tablet (500 mg total) by mouth 2 (two) times daily.  14 tablet  0  . citalopram (CELEXA) 20 MG tablet       . [facility] LANCETS MICRO THIN 33G MISC Check blood sugar 3 times a day before meals dx code 250.00 insulin requiring  100 each  12  . Flexpen Starter Kit MISC 1 kit by Other route once.  1 each  0  . glucose blood (ACCU-CHEK AVIVA PLUS) test strip Check blood sugar 3 times a day before meals dx code 250.00 insulin requiring  100 each  12  . HYDROcodone-acetaminophen (NORCO/VICODIN) 5-325 MG per tablet TAKE 1 TABLET BY MOUTH TWICE A DAY AS NEEDED FOR PAIN. Do not fill before 12/07/12  60 tablet  0  . Insulin Glargine (LANTUS  SOLOSTAR) 100 UNIT/ML SOPN Inject 10 Units into the skin at bedtime.  10 mL  5  . Lancet Devices (ACCU-CHEK SOFTCLIX) lancets Check blood sugar 3 times a day before meals dx code 250.00 insulin requiring  100 each  12  . lisinopril (PRINIVIL,ZESTRIL) 40 MG tablet Take 40 mg by mouth daily.      . Loratadine 10 MG CAPS Take 1 capsule (10 mg total) by mouth daily.  10 each  0  . metFORMIN (GLUCOPHAGE) 1000 MG tablet Please take metformin with food, 250 mg twice daily for 7 days, then increase the dose to 500 mg twice daily for another 7 days, then increase the dose to 1000 mg twice daily and keep it.  120 tablet  3  . pantoprazole (PROTONIX) 20 MG tablet TAKE 1 TABLET (20 MG TOTAL) BY MOUTH DAILY.  30 tablet  5  . simvastatin (ZOCOR) 20 MG tablet TAKE 1 TABLET BY MOUTH EVERY DAY  90  tablet  3  . triamcinolone ointment (KENALOG) 0.1 % Apply 1 application topically 2 (two) times daily.  30 g  0   No current facility-administered medications for this visit.   Family History  Problem Relation Age of Onset  . Hyperlipidemia Brother   . Hypertension Brother   . Diabetes Brother    History   Social History  . Marital Status: Widowed    Spouse Name: N/A    Number of Children: N/A  . Years of Education: N/A   Social History Main Topics  . Smoking status: Former Smoker    Types: Cigarettes    Quit date: 02/17/2000  . Smokeless tobacco: Never Used  . Alcohol Use: No  . Drug Use: No  . Sexual Activity: None   Other Topics Concern  . None   Social History Narrative   Patient requests to use [facility] for her diabets testing supplies as of 09/05/2009.   Review of Systems: CONSTITUTIONAL- No Fever, weightloss, night sweat or change in appetite. HEAD- No Headache, or dizziness. Mouth/throat- No Sorethroat. RESPIRATORY- No Cough, or SOB. CARDIAC- No Palpitations, or chest pain. GI- No vomiting, diarrhoea, constipation, abd pain. NEUROLOGIC- No Numbness, syncope, or burning.  Objective:  Physical Exam: Filed Vitals:   12/23/12 1455  BP: 130/80  Pulse: 70  Temp: 97.5 F (36.4 C)  TempSrc: Oral  Height: 5\' 4"  (1.626 m)  Weight: 205 lb 12.8 oz (93.35 kg)  SpO2: 100%   GENERAL- alert, co-operative, appears as stated age, not in any distress. HEENT- Atraumatic, normocephalic, PERRL, EOMI, oral mucosa appears dry, good and intact dentition. No carotid bruit, no cervical LN enlargement, thyroid does not appear enlarged. CARDIAC- RRR, no murmurs, rubs or gallops. RESP- Moving equal volumes of air, and clear to auscultation bilaterally. ABDOMEN- Soft,non tender, no palpable masses or organomegaly, bowel sounds present. BACK- Normal curvature of the spine, No tenderness along the vertebrae, no CVA tenderness. NEURO- No obvious Cr N  deficit. EXTREMITIES- pulse 2+, symmetric, +1 pitting pedal edema. SKIN- New Hyperpigmented lesion present on the the forearm, Rt upper chest, shoulder, and leg, circular to semi circular in shape, measuring about 6cm by 5 cm, 2 of which appear mildly scaly on the surface. PSYCH- Normal mood and affect, appropriate thought content and speech.  Assessment & Plan:  The patient's case and plan of care was discussed with attending physician, Dr. Dorris Singh.  Please see problem based chatting for assessment and plan.

## 2012-12-24 LAB — URINE CULTURE

## 2012-12-27 DIAGNOSIS — R21 Rash and other nonspecific skin eruption: Secondary | ICD-10-CM | POA: Insufficient documentation

## 2012-12-27 NOTE — Assessment & Plan Note (Signed)
New Hyperpigmented lesion present on the the forearm, Rt upper chest, shoulder, and leg, circular to semi circular in shape, measuring about 6cm by 5 cm, 2 of which appear mildly scaly on the surface.  Plan- Will commence triamcinolone ointment- 0.1 % BID. - If symptoms do not resolve consider dermatology referral.

## 2013-01-02 NOTE — Progress Notes (Signed)
I saw and evaluated the patient.  I personally confirmed the key portions of the history and exam documented by Dr. Emokpae and I reviewed pertinent patient test results.  The assessment, diagnosis, and plan were formulated together and I agree with the documentation in the resident's note. 

## 2013-01-03 ENCOUNTER — Other Ambulatory Visit: Payer: Self-pay | Admitting: *Deleted

## 2013-01-03 ENCOUNTER — Other Ambulatory Visit: Payer: Self-pay | Admitting: Internal Medicine

## 2013-01-03 DIAGNOSIS — E119 Type 2 diabetes mellitus without complications: Secondary | ICD-10-CM

## 2013-01-03 DIAGNOSIS — N390 Urinary tract infection, site not specified: Secondary | ICD-10-CM

## 2013-01-03 DIAGNOSIS — R0602 Shortness of breath: Secondary | ICD-10-CM

## 2013-01-03 DIAGNOSIS — I10 Essential (primary) hypertension: Secondary | ICD-10-CM

## 2013-01-03 MED ORDER — HYDROCODONE-ACETAMINOPHEN 5-325 MG PO TABS: ORAL_TABLET | ORAL | Status: DC

## 2013-01-03 MED ORDER — CARVEDILOL 25 MG PO TABS: 25.0000 mg | ORAL_TABLET | Freq: Two times a day (BID) | ORAL | Status: DC

## 2013-01-03 MED ORDER — HYDROCODONE-ACETAMINOPHEN 5-325 MG PO TABS: ORAL_TABLET | ORAL | Status: AC

## 2013-01-03 MED ORDER — METFORMIN HCL 1000 MG PO TABS: ORAL_TABLET | ORAL | Status: DC

## 2013-01-03 MED ORDER — CITALOPRAM HYDROBROMIDE 20 MG PO TABS: 20.0000 mg | ORAL_TABLET | Freq: Every day | ORAL | Status: DC

## 2013-01-03 MED ORDER — ALBUTEROL SULFATE HFA 108 (90 BASE) MCG/ACT IN AERS: 2.0000 | INHALATION_SPRAY | RESPIRATORY_TRACT | Status: DC | PRN

## 2013-01-04 ENCOUNTER — Telehealth: Payer: Self-pay | Admitting: *Deleted

## 2013-01-04 NOTE — Telephone Encounter (Signed)
Called pt - no answer; message left for pt to call the clinic. 

## 2013-01-04 NOTE — Telephone Encounter (Signed)
No return from pt. [facility] -message left to discontinue Celexa per Dr Virgina Organ.

## 2013-01-04 NOTE — Telephone Encounter (Signed)
Message copied by Hassan Buckler on Wed Jan 04, 2013  9:15 AM ------      Message from: Baltazar Apo      Created: Tue Jan 03, 2013  8:11 PM      Regarding: celexa        Per last note with Dr. Loistine Chance, celexa was discontinued but i just received a refill request for it. Is patient taking celexa still? I accidentally ordered the celexa refill but now discontinued. Please call pharmacy and discontinue in the mean time and double check with patient.            Thanks,            Dr q ------

## 2013-01-06 ENCOUNTER — Telehealth: Payer: Self-pay | Admitting: *Deleted

## 2013-01-06 NOTE — Telephone Encounter (Signed)
Spoke w/ pt's daughter, she has not been taking the celexa and her mood is very good

## 2013-01-06 NOTE — Telephone Encounter (Signed)
Great. Please cancel the celexa at the pharmacy. i have already done it in the system.

## 2013-01-19 ENCOUNTER — Inpatient Hospital Stay (HOSPITAL_COMMUNITY)
Admission: EM | Admit: 2013-01-19 | Discharge: 2013-01-27 | DRG: 394 | Disposition: A | Discharge status: Home / Self Care | Payer: Medicare Other | Attending: Internal Medicine | Admitting: Internal Medicine

## 2013-01-19 ENCOUNTER — Encounter (HOSPITAL_COMMUNITY): Payer: Self-pay | Admitting: Emergency Medicine

## 2013-01-19 ENCOUNTER — Encounter (HOSPITAL_COMMUNITY)
Admission: EM | Disposition: A | Discharge status: Home / Self Care | Payer: Self-pay | Source: Home / Self Care | Attending: Internal Medicine

## 2013-01-19 ENCOUNTER — Encounter (HOSPITAL_COMMUNITY): Payer: Medicare Other | Admitting: Certified Registered"

## 2013-01-19 ENCOUNTER — Emergency Department (HOSPITAL_COMMUNITY): Payer: Medicare Other

## 2013-01-19 ENCOUNTER — Inpatient Hospital Stay (HOSPITAL_COMMUNITY): Payer: Medicare Other | Admitting: Certified Registered"

## 2013-01-19 DIAGNOSIS — A4902 Methicillin resistant Staphylococcus aureus infection, unspecified site: Secondary | ICD-10-CM | POA: Diagnosis present

## 2013-01-19 DIAGNOSIS — Z85118 Personal history of other malignant neoplasm of bronchus and lung: Secondary | ICD-10-CM

## 2013-01-19 DIAGNOSIS — M549 Dorsalgia, unspecified: Secondary | ICD-10-CM | POA: Diagnosis not present

## 2013-01-19 DIAGNOSIS — N320 Bladder-neck obstruction: Secondary | ICD-10-CM

## 2013-01-19 DIAGNOSIS — I5020 Unspecified systolic (congestive) heart failure: Secondary | ICD-10-CM | POA: Diagnosis present

## 2013-01-19 DIAGNOSIS — Z86011 Personal history of benign neoplasm of the brain: Secondary | ICD-10-CM | POA: Diagnosis present

## 2013-01-19 DIAGNOSIS — I428 Other cardiomyopathies: Secondary | ICD-10-CM | POA: Diagnosis present

## 2013-01-19 DIAGNOSIS — E785 Hyperlipidemia, unspecified: Secondary | ICD-10-CM | POA: Diagnosis present

## 2013-01-19 DIAGNOSIS — R7309 Other abnormal glucose: Secondary | ICD-10-CM | POA: Diagnosis not present

## 2013-01-19 DIAGNOSIS — Z87891 Personal history of nicotine dependence: Secondary | ICD-10-CM

## 2013-01-19 DIAGNOSIS — M25559 Pain in unspecified hip: Secondary | ICD-10-CM | POA: Diagnosis not present

## 2013-01-19 DIAGNOSIS — I509 Heart failure, unspecified: Secondary | ICD-10-CM | POA: Diagnosis present

## 2013-01-19 DIAGNOSIS — K219 Gastro-esophageal reflux disease without esophagitis: Secondary | ICD-10-CM | POA: Diagnosis present

## 2013-01-19 DIAGNOSIS — I5022 Chronic systolic (congestive) heart failure: Secondary | ICD-10-CM | POA: Diagnosis present

## 2013-01-19 DIAGNOSIS — D320 Benign neoplasm of cerebral meninges: Secondary | ICD-10-CM | POA: Diagnosis present

## 2013-01-19 DIAGNOSIS — L03317 Cellulitis of buttock: Secondary | ICD-10-CM | POA: Diagnosis not present

## 2013-01-19 DIAGNOSIS — Z8614 Personal history of Methicillin resistant Staphylococcus aureus infection: Secondary | ICD-10-CM | POA: Diagnosis not present

## 2013-01-19 DIAGNOSIS — Z7982 Long term (current) use of aspirin: Secondary | ICD-10-CM

## 2013-01-19 DIAGNOSIS — N63 Unspecified lump in unspecified breast: Secondary | ICD-10-CM | POA: Diagnosis present

## 2013-01-19 DIAGNOSIS — T50995A Adverse effect of other drugs, medicaments and biological substances, initial encounter: Secondary | ICD-10-CM | POA: Diagnosis not present

## 2013-01-19 DIAGNOSIS — N179 Acute kidney failure, unspecified: Secondary | ICD-10-CM | POA: Diagnosis not present

## 2013-01-19 DIAGNOSIS — E119 Type 2 diabetes mellitus without complications: Secondary | ICD-10-CM | POA: Diagnosis not present

## 2013-01-19 DIAGNOSIS — K612 Anorectal abscess: Secondary | ICD-10-CM | POA: Diagnosis not present

## 2013-01-19 DIAGNOSIS — Z8673 Personal history of transient ischemic attack (TIA), and cerebral infarction without residual deficits: Secondary | ICD-10-CM | POA: Diagnosis not present

## 2013-01-19 DIAGNOSIS — L259 Unspecified contact dermatitis, unspecified cause: Secondary | ICD-10-CM | POA: Diagnosis not present

## 2013-01-19 DIAGNOSIS — E1169 Type 2 diabetes mellitus with other specified complication: Secondary | ICD-10-CM | POA: Diagnosis present

## 2013-01-19 DIAGNOSIS — E876 Hypokalemia: Secondary | ICD-10-CM | POA: Diagnosis present

## 2013-01-19 DIAGNOSIS — IMO0001 Reserved for inherently not codable concepts without codable children: Secondary | ICD-10-CM | POA: Diagnosis present

## 2013-01-19 DIAGNOSIS — I10 Essential (primary) hypertension: Secondary | ICD-10-CM | POA: Diagnosis present

## 2013-01-19 DIAGNOSIS — N390 Urinary tract infection, site not specified: Secondary | ICD-10-CM | POA: Diagnosis present

## 2013-01-19 DIAGNOSIS — Z79899 Other long term (current) drug therapy: Secondary | ICD-10-CM | POA: Diagnosis not present

## 2013-01-19 DIAGNOSIS — I152 Hypertension secondary to endocrine disorders: Secondary | ICD-10-CM | POA: Diagnosis present

## 2013-01-19 DIAGNOSIS — N059 Unspecified nephritic syndrome with unspecified morphologic changes: Secondary | ICD-10-CM | POA: Diagnosis not present

## 2013-01-19 DIAGNOSIS — L0231 Cutaneous abscess of buttock: Secondary | ICD-10-CM | POA: Diagnosis present

## 2013-01-19 DIAGNOSIS — E1159 Type 2 diabetes mellitus with other circulatory complications: Secondary | ICD-10-CM | POA: Diagnosis present

## 2013-01-19 DIAGNOSIS — E1140 Type 2 diabetes mellitus with diabetic neuropathy, unspecified: Secondary | ICD-10-CM | POA: Diagnosis present

## 2013-01-19 DIAGNOSIS — R739 Hyperglycemia, unspecified: Secondary | ICD-10-CM

## 2013-01-19 DIAGNOSIS — I5032 Chronic diastolic (congestive) heart failure: Secondary | ICD-10-CM | POA: Diagnosis present

## 2013-01-19 DIAGNOSIS — T07XXXA Unspecified multiple injuries, initial encounter: Secondary | ICD-10-CM | POA: Diagnosis not present

## 2013-01-19 DIAGNOSIS — C3490 Malignant neoplasm of unspecified part of unspecified bronchus or lung: Secondary | ICD-10-CM | POA: Diagnosis present

## 2013-01-19 HISTORY — PX: INCISION AND DRAINAGE PERIRECTAL ABSCESS: SHX1804

## 2013-01-19 LAB — COMPREHENSIVE METABOLIC PANEL
ALT: 8 U/L (ref 0–35)
Albumin: 3.2 g/dL — ABNORMAL LOW (ref 3.5–5.2)
Alkaline Phosphatase: 85 U/L (ref 39–117)
BUN: 16 mg/dL (ref 6–23)
Calcium: 8.9 mg/dL (ref 8.4–10.5)
Chloride: 97 mEq/L (ref 96–112)
Potassium: 3.2 mEq/L — ABNORMAL LOW (ref 3.5–5.1)
Sodium: 139 mEq/L (ref 135–145)
Total Bilirubin: 0.3 mg/dL (ref 0.3–1.2)
Total Protein: 7.3 g/dL (ref 6.0–8.3)

## 2013-01-19 LAB — GLUCOSE, CAPILLARY
Glucose-Capillary: 178 mg/dL — ABNORMAL HIGH (ref 70–99)
Glucose-Capillary: 179 mg/dL — ABNORMAL HIGH (ref 70–99)
Glucose-Capillary: 204 mg/dL — ABNORMAL HIGH (ref 70–99)
Glucose-Capillary: 305 mg/dL — ABNORMAL HIGH (ref 70–99)
Glucose-Capillary: 307 mg/dL — ABNORMAL HIGH (ref 70–99)
Glucose-Capillary: 395 mg/dL — ABNORMAL HIGH (ref 70–99)

## 2013-01-19 LAB — URINALYSIS, ROUTINE W REFLEX MICROSCOPIC
Bilirubin Urine: NEGATIVE
Ketones, ur: NEGATIVE mg/dL
Nitrite: NEGATIVE
Protein, ur: NEGATIVE mg/dL
Specific Gravity, Urine: 1.04 — ABNORMAL HIGH (ref 1.005–1.030)
Urobilinogen, UA: 0.2 mg/dL (ref 0.0–1.0)

## 2013-01-19 LAB — POCT I-STAT 3, VENOUS BLOOD GAS (G3P V)
Acid-Base Excess: 8 mmol/L — ABNORMAL HIGH (ref 0.0–2.0)
O2 Saturation: 49 %
TCO2: 34 mmol/L (ref 0–100)

## 2013-01-19 LAB — CBC WITH DIFFERENTIAL/PLATELET
Basophils Relative: 0 % (ref 0–1)
Eosinophils Absolute: 0.1 10*3/uL (ref 0.0–0.7)
Hemoglobin: 11.7 g/dL — ABNORMAL LOW (ref 12.0–15.0)
Lymphocytes Relative: 13 % (ref 12–46)
Lymphs Abs: 1.8 10*3/uL (ref 0.7–4.0)
MCHC: 32.5 g/dL (ref 30.0–36.0)
Monocytes Relative: 11 % (ref 3–12)
Neutro Abs: 10.1 10*3/uL — ABNORMAL HIGH (ref 1.7–7.7)
Neutrophils Relative %: 76 % (ref 43–77)
RBC: 4.59 MIL/uL (ref 3.87–5.11)

## 2013-01-19 LAB — URINE MICROSCOPIC-ADD ON

## 2013-01-19 LAB — POCT I-STAT 3, ART BLOOD GAS (G3+)
Acid-Base Excess: 4 mmol/L — ABNORMAL HIGH (ref 0.0–2.0)
Bicarbonate: 28.4 mEq/L — ABNORMAL HIGH (ref 20.0–24.0)
O2 Saturation: 94 %
pH, Arterial: 7.46 — ABNORMAL HIGH (ref 7.350–7.450)

## 2013-01-19 LAB — KETONES, QUALITATIVE: Acetone, Bld: NEGATIVE

## 2013-01-19 SURGERY — INCISION AND DRAINAGE, ABSCESS, PERIRECTAL
Anesthesia: General | Site: Buttocks

## 2013-01-19 MED ORDER — PANTOPRAZOLE SODIUM 20 MG PO TBEC
20.0000 mg | DELAYED_RELEASE_TABLET | Freq: Every day | ORAL | Status: DC
  Administered 2013-01-20 – 2013-01-27 (×8): 20 mg via ORAL
  Filled 2013-01-19 (×8): qty 1

## 2013-01-19 MED ORDER — FENTANYL CITRATE 0.05 MG/ML IJ SOLN
INTRAMUSCULAR | Status: DC | PRN
  Administered 2013-01-19: 100 ug via INTRAVENOUS

## 2013-01-19 MED ORDER — LACTATED RINGERS IV SOLN
INTRAVENOUS | Status: DC | PRN
  Administered 2013-01-19: 18:00:00 via INTRAVENOUS

## 2013-01-19 MED ORDER — ONDANSETRON HCL 4 MG/2ML IJ SOLN
4.0000 mg | Freq: Once | INTRAMUSCULAR | Status: AC
  Administered 2013-01-19: 4 mg via INTRAVENOUS
  Filled 2013-01-19: qty 2

## 2013-01-19 MED ORDER — INSULIN GLARGINE 100 UNIT/ML ~~LOC~~ SOLN
10.0000 [IU] | Freq: Every day | SUBCUTANEOUS | Status: DC
  Administered 2013-01-20: 10 [IU] via SUBCUTANEOUS
  Filled 2013-01-19 (×2): qty 0.1

## 2013-01-19 MED ORDER — INSULIN GLARGINE 100 UNIT/ML SOLOSTAR PEN: 10.0000 [IU] | PEN_INJECTOR | Freq: Every day | SUBCUTANEOUS | Status: DC

## 2013-01-19 MED ORDER — TRIAMCINOLONE ACETONIDE 0.1 % EX OINT
1.0000 "application " | TOPICAL_OINTMENT | Freq: Two times a day (BID) | CUTANEOUS | Status: DC
  Administered 2013-01-20 – 2013-01-27 (×15): 1 via TOPICAL
  Filled 2013-01-19 (×2): qty 15

## 2013-01-19 MED ORDER — 0.9 % SODIUM CHLORIDE (POUR BTL) OPTIME
TOPICAL | Status: DC | PRN
  Administered 2013-01-19: 1000 mL

## 2013-01-19 MED ORDER — SODIUM CHLORIDE 0.9 % IV SOLN
INTRAVENOUS | Status: AC
  Administered 2013-01-19: 22:00:00 via INTRAVENOUS

## 2013-01-19 MED ORDER — POTASSIUM CHLORIDE CRYS ER 20 MEQ PO TBCR
40.0000 meq | EXTENDED_RELEASE_TABLET | Freq: Once | ORAL | Status: AC
  Administered 2013-01-20: 40 meq via ORAL
  Filled 2013-01-19: qty 2

## 2013-01-19 MED ORDER — PROPOFOL 10 MG/ML IV BOLUS
INTRAVENOUS | Status: DC | PRN
  Administered 2013-01-19: 50 mg via INTRAVENOUS

## 2013-01-19 MED ORDER — SODIUM CHLORIDE 0.9 % IV SOLN
Freq: Once | INTRAVENOUS | Status: AC
  Administered 2013-01-19: 100 mL/h via INTRAVENOUS

## 2013-01-19 MED ORDER — PIPERACILLIN-TAZOBACTAM 3.375 G IVPB
3.3750 g | Freq: Three times a day (TID) | INTRAVENOUS | Status: DC
  Administered 2013-01-19 – 2013-01-21 (×6): 3.375 g via INTRAVENOUS
  Filled 2013-01-19 (×9): qty 50

## 2013-01-19 MED ORDER — SIMVASTATIN 20 MG PO TABS
20.0000 mg | ORAL_TABLET | Freq: Every day | ORAL | Status: DC
  Administered 2013-01-20 – 2013-01-27 (×8): 20 mg via ORAL
  Filled 2013-01-19 (×8): qty 1

## 2013-01-19 MED ORDER — HEPARIN SODIUM (PORCINE) 5000 UNIT/ML IJ SOLN
5000.0000 [IU] | Freq: Three times a day (TID) | INTRAMUSCULAR | Status: DC
  Administered 2013-01-20 – 2013-01-27 (×21): 5000 [IU] via SUBCUTANEOUS
  Filled 2013-01-19 (×27): qty 1

## 2013-01-19 MED ORDER — MORPHINE SULFATE 4 MG/ML IJ SOLN
4.0000 mg | Freq: Once | INTRAMUSCULAR | Status: AC
  Administered 2013-01-19: 4 mg via INTRAVENOUS
  Filled 2013-01-19: qty 1

## 2013-01-19 MED ORDER — OXYCODONE HCL 5 MG PO TABS
5.0000 mg | ORAL_TABLET | ORAL | Status: DC | PRN
  Administered 2013-01-19: 10 mg via ORAL
  Administered 2013-01-20: 5 mg via ORAL
  Administered 2013-01-20: 10 mg via ORAL
  Administered 2013-01-21 – 2013-01-24 (×8): 5 mg via ORAL
  Administered 2013-01-24: 10 mg via ORAL
  Filled 2013-01-19 (×8): qty 1
  Filled 2013-01-19 (×2): qty 2
  Filled 2013-01-19: qty 1

## 2013-01-19 MED ORDER — SUCCINYLCHOLINE CHLORIDE 20 MG/ML IJ SOLN
INTRAMUSCULAR | Status: DC | PRN
  Administered 2013-01-19: 100 mg via INTRAVENOUS

## 2013-01-19 MED ORDER — ASPIRIN 81 MG PO CHEW
81.0000 mg | CHEWABLE_TABLET | Freq: Every day | ORAL | Status: DC
  Administered 2013-01-20 – 2013-01-27 (×8): 81 mg via ORAL
  Filled 2013-01-19 (×9): qty 1

## 2013-01-19 MED ORDER — DROPERIDOL 2.5 MG/ML IJ SOLN: 0.6250 mg | INTRAMUSCULAR | Status: DC | PRN

## 2013-01-19 MED ORDER — IOHEXOL 300 MG/ML  SOLN
80.0000 mL | Freq: Once | INTRAMUSCULAR | Status: AC | PRN
  Administered 2013-01-19: 80 mL via INTRAVENOUS

## 2013-01-19 MED ORDER — INSULIN GLARGINE 100 UNIT/ML ~~LOC~~ SOLN: 8.0000 [IU] | Freq: Every day | SUBCUTANEOUS | Status: DC

## 2013-01-19 MED ORDER — ALBUTEROL SULFATE HFA 108 (90 BASE) MCG/ACT IN AERS
2.0000 | INHALATION_SPRAY | RESPIRATORY_TRACT | Status: DC | PRN
  Filled 2013-01-19: qty 6.7

## 2013-01-19 MED ORDER — POTASSIUM CHLORIDE IN NACL 20-0.9 MEQ/L-% IV SOLN
INTRAVENOUS | Status: DC
  Filled 2013-01-19 (×2): qty 1000

## 2013-01-19 MED ORDER — INSULIN ASPART 100 UNIT/ML ~~LOC~~ SOLN
5.0000 [IU] | Freq: Once | SUBCUTANEOUS | Status: AC
  Administered 2013-01-19: 5 [IU] via SUBCUTANEOUS
  Filled 2013-01-19: qty 1

## 2013-01-19 MED ORDER — CARVEDILOL 25 MG PO TABS
25.0000 mg | ORAL_TABLET | Freq: Two times a day (BID) | ORAL | Status: DC
  Administered 2013-01-20 – 2013-01-27 (×15): 25 mg via ORAL
  Filled 2013-01-19 (×17): qty 1

## 2013-01-19 MED ORDER — VANCOMYCIN HCL IN DEXTROSE 1-5 GM/200ML-% IV SOLN: 1000.0000 mg | Freq: Two times a day (BID) | INTRAVENOUS | Status: DC

## 2013-01-19 MED ORDER — HYDROMORPHONE HCL PF 1 MG/ML IJ SOLN: 1.0000 mg | INTRAMUSCULAR | Status: DC | PRN

## 2013-01-19 MED ORDER — FENTANYL CITRATE 0.05 MG/ML IJ SOLN
25.0000 ug | INTRAMUSCULAR | Status: DC | PRN
  Administered 2013-01-19 (×2): 50 ug via INTRAVENOUS

## 2013-01-19 MED ORDER — FENTANYL CITRATE 0.05 MG/ML IJ SOLN
INTRAMUSCULAR | Status: AC
  Administered 2013-01-19: 50 ug via INTRAVENOUS
  Filled 2013-01-19: qty 2

## 2013-01-19 MED ORDER — LIDOCAINE HCL (CARDIAC) 20 MG/ML IV SOLN
INTRAVENOUS | Status: DC | PRN
  Administered 2013-01-19: 20 mg via INTRAVENOUS

## 2013-01-19 MED ORDER — LISINOPRIL 40 MG PO TABS
40.0000 mg | ORAL_TABLET | Freq: Every day | ORAL | Status: DC
  Administered 2013-01-20 – 2013-01-23 (×4): 40 mg via ORAL
  Filled 2013-01-19 (×4): qty 1

## 2013-01-19 MED ORDER — OXYCODONE HCL 5 MG PO TABS
ORAL_TABLET | ORAL | Status: AC
  Administered 2013-01-19: 10 mg via ORAL
  Filled 2013-01-19: qty 2

## 2013-01-19 MED ORDER — MORPHINE SULFATE 2 MG/ML IJ SOLN: 2.0000 mg | INTRAMUSCULAR | Status: DC | PRN

## 2013-01-19 MED ORDER — ONDANSETRON HCL 4 MG/2ML IJ SOLN: 4.0000 mg | Freq: Three times a day (TID) | INTRAMUSCULAR | Status: AC | PRN

## 2013-01-19 MED ORDER — VANCOMYCIN HCL 10 G IV SOLR
1250.0000 mg | Freq: Two times a day (BID) | INTRAVENOUS | Status: DC
  Administered 2013-01-20 – 2013-01-21 (×3): 1250 mg via INTRAVENOUS
  Filled 2013-01-19 (×4): qty 1250

## 2013-01-19 MED ORDER — LORATADINE 10 MG PO TABS
10.0000 mg | ORAL_TABLET | Freq: Every day | ORAL | Status: DC
  Administered 2013-01-20 – 2013-01-27 (×8): 10 mg via ORAL
  Filled 2013-01-19 (×9): qty 1

## 2013-01-19 MED ORDER — INSULIN ASPART 100 UNIT/ML ~~LOC~~ SOLN
0.0000 [IU] | SUBCUTANEOUS | Status: DC
  Administered 2013-01-20: 2 [IU] via SUBCUTANEOUS
  Administered 2013-01-20: 5 [IU] via SUBCUTANEOUS
  Administered 2013-01-20 (×2): 2 [IU] via SUBCUTANEOUS
  Administered 2013-01-20: 7 [IU] via SUBCUTANEOUS
  Administered 2013-01-20: 2 [IU] via SUBCUTANEOUS
  Administered 2013-01-21 (×3): 1 [IU] via SUBCUTANEOUS
  Administered 2013-01-21: 2 [IU] via SUBCUTANEOUS
  Administered 2013-01-21: 1 [IU] via SUBCUTANEOUS

## 2013-01-19 SURGICAL SUPPLY — 32 items
BANDAGE GAUZE ELAST BULKY 4 IN (GAUZE/BANDAGES/DRESSINGS) ×1 IMPLANT
BLADE SURG 15 STRL LF DISP TIS (BLADE) ×1 IMPLANT
BLADE SURG 15 STRL SS (BLADE) ×2
CANISTER SUCTION 2500CC (MISCELLANEOUS) ×2 IMPLANT
CLEANER TIP ELECTROSURG 2X2 (MISCELLANEOUS) IMPLANT
COVER SURGICAL LIGHT HANDLE (MISCELLANEOUS) ×2 IMPLANT
DRAPE UTILITY 15X26 W/TAPE STR (DRAPE) ×4 IMPLANT
DRSG PAD ABDOMINAL 8X10 ST (GAUZE/BANDAGES/DRESSINGS) ×2 IMPLANT
ELECT REM PT RETURN 9FT ADLT (ELECTROSURGICAL)
ELECTRODE REM PT RTRN 9FT ADLT (ELECTROSURGICAL) IMPLANT
GAUZE PACKING IODOFORM 1 (PACKING) IMPLANT
GAUZE SPONGE 4X4 16PLY XRAY LF (GAUZE/BANDAGES/DRESSINGS) ×2 IMPLANT
GLOVE BIO SURGEON STRL SZ8 (GLOVE) ×2 IMPLANT
GLOVE BIOGEL PI IND STRL 8 (GLOVE) ×1 IMPLANT
GLOVE BIOGEL PI INDICATOR 8 (GLOVE) ×1
GOWN STRL NON-REIN LRG LVL3 (GOWN DISPOSABLE) ×4 IMPLANT
GOWN STRL REIN XL XLG (GOWN DISPOSABLE) ×2 IMPLANT
KIT BASIN OR (CUSTOM PROCEDURE TRAY) ×2 IMPLANT
KIT ROOM TURNOVER OR (KITS) ×2 IMPLANT
NS IRRIG 1000ML POUR BTL (IV SOLUTION) ×2 IMPLANT
PACK LITHOTOMY IV (CUSTOM PROCEDURE TRAY) ×2 IMPLANT
PAD ARMBOARD 7.5X6 YLW CONV (MISCELLANEOUS) ×4 IMPLANT
PENCIL BUTTON HOLSTER BLD 10FT (ELECTRODE) IMPLANT
SPONGE GAUZE 4X4 12PLY (GAUZE/BANDAGES/DRESSINGS) ×2 IMPLANT
SWAB COLLECTION DEVICE MRSA (MISCELLANEOUS) ×2 IMPLANT
TOWEL OR 17X24 6PK STRL BLUE (TOWEL DISPOSABLE) ×2 IMPLANT
TOWEL OR 17X26 10 PK STRL BLUE (TOWEL DISPOSABLE) ×2 IMPLANT
TUBE ANAEROBIC SPECIMEN COL (MISCELLANEOUS) ×2 IMPLANT
TUBE CONNECTING 12X1/4 (SUCTIONS) ×2 IMPLANT
UNDERPAD 30X30 INCONTINENT (UNDERPADS AND DIAPERS) ×2 IMPLANT
WATER STERILE IRR 1000ML POUR (IV SOLUTION) ×2 IMPLANT
YANKAUER SUCT BULB TIP NO VENT (SUCTIONS) ×2 IMPLANT

## 2013-01-19 NOTE — ED Notes (Signed)
MD at bedside. 

## 2013-01-19 NOTE — Anesthesia Postprocedure Evaluation (Signed)
  Anesthesia Post-op Note  Patient: [name]  Procedure(s) Performed: Procedure(s): IRRIGATION AND DEBRIDEMENT PERIRECTAL ABSCESS (N/A)  Patient Location: PACU  Anesthesia Type:General  Level of Consciousness: awake  Airway and Oxygen Therapy: Patient Spontanous Breathing  Post-op Pain: mild  Post-op Assessment: Post-op Vital signs reviewed  Post-op Vital Signs: stable  Complications: No apparent anesthesia complications

## 2013-01-19 NOTE — Progress Notes (Signed)
79yo female was brought to OR for I&D of abscess prior to vanc starting, now post-op and to begin IV ABX.  Will start vancomycin 1250mg  IV Q12H for wt 94kg and CrCl ~65 ml/min and monitor CBC, Cx, levels prn.  Vernard Gambles, PharmD, BCPS 01/19/2013 11:07 PM

## 2013-01-19 NOTE — Op Note (Signed)
01/19/2013  6:36 PM  PATIENT:  [name]  79 y.o. female  PRE-OPERATIVE DIAGNOSIS:  RIGHT PERIRECTAL ABSCESS  POST-OPERATIVE DIAGNOSIS:  RIGHT PERIRECTAL ABSCESS  PROCEDURE:  Procedure(s): IRRIGATION AND DEBRIDEMENT RIGHT PERIRECTAL ABSCESS  SURGEON:  Surgeon(s): Liz Malady, MD  PHYSICIAN ASSISTANT:   ASSISTANTS: none   ANESTHESIA:   general  EBL:     BLOOD ADMINISTERED:none  DRAINS: none   SPECIMEN:  Cultures sent to micro lab  DISPOSITION OF SPECIMEN:  N/A  COUNTS:  YES  DICTATION: .Dragon Dictation Patient presented with a right perirectal abscess. She was seen in consultation and is brought for emergent incision and drainage of right perirectal abscess.Informed consent was obtained. She received intravenous antibiotics. She was brought to the operating room and general endotracheal anesthesia was administered by the anesthesia staff. She was placed in lithotomy position. The perianal region was prepped and draped in a sterile fashion bilaterally. We did a time out procedure. She'll large fluctuant area in the medial right buttock. This was incised with the Bovie. I Entered a large cavity filled with purulent material. This was sent for culture. Loculations were broken up. The cavity was fully evacuated. It was then irrigated with copious saline. Hemostasis was obtained with cautery. The wound was then packed with a saline-soaked Kerlix gauze and sterile dressings were applied. All counts were correct. She tolerated the procedure without apparent complication and was taken recovery in stable condition.  PATIENT DISPOSITION:  PACU - hemodynamically stable.   Delay start of Pharmacological VTE agent (>24hrs) due to surgical blood loss or risk of bleeding:  no  Violeta Gelinas, MD, MPH, FACS Pager: [phone]  12/4/20146:36 PM

## 2013-01-19 NOTE — Progress Notes (Signed)
ANTIBIOTIC CONSULT NOTE - INITIAL  Pharmacy Consult for Vanco/Zosyn Indication: R buttock abscess  No Known Allergies  Patient Measurements:   Adjusted Body Weight:    Vital Signs: Temp: 98.8 F (37.1 C) (12/04 0914) Temp src: Oral (12/04 0914) BP: 148/90 mmHg (12/04 0914) Pulse Rate: 90 (12/04 0945) Intake/Output from previous day:   Intake/Output from this shift:    Labs:  Recent Labs  01/19/13 0925  WBC 13.4*  HGB 11.7*  PLT 306  CREATININE 0.72   The CrCl is unknown because both a height and weight (above a minimum accepted value) are required for this calculation. No results found for this basename: VANCOTROUGH, Leodis Binet, VANCORANDOM, GENTTROUGH, GENTPEAK, GENTRANDOM, TOBRATROUGH, TOBRAPEAK, TOBRARND, AMIKACINPEAK, AMIKACINTROU, AMIKACIN,  in the last 72 hours   Microbiology: Recent Results (from the past 720 hour(s))  URINE CULTURE     Status: None   Collection Time    12/23/12  4:05 PM      Result Value Range Status   Colony Count >=100,000 COLONIES/ML   Final   Organism ID, Bacteria Multiple bacterial morphotypes present, none   Final   Organism ID, Bacteria predominant. Suggest appropriate recollection if    Final   Organism ID, Bacteria clinically indicated.   Final    Medical History: Past Medical History  Diagnosis Date  . Diabetes mellitus 2007    HgA1C (02/20/2010) = 9.2, HgA1C (03/20/2009) = 12.1  . Hyperlipidemia   . Hypertension   . GERD (gastroesophageal reflux disease)   . CVA (cerebrovascular accident) 2006     right embolic stroke, no residual deficits  . Diverticulitis   . CVA (cerebral infarction) 7-yrs ago  . Arthritis   . Dysphagia   . CHF (congestive heart failure)   . DIABETES MELLITUS, TYPE II 12/04/2005  . HYPERLIPIDEMIA 12/04/2005  . HYPERTENSION 12/04/2005  . GERD 12/04/2005  . ARTHRITIS, KNEE 03/24/2006  . Lung mass 07/08/2010  . Meningioma 07/21/2010  . Hemangioma of liver 12/02/2010  . Depression 12/02/2010  .  Adenocarcinoma of lung      Right upper lobe adenocarcinoma.     Medications: pending  Assessment: Boil of buttock  79 y/o F presents with c/o pain in her R buttocks x 2 weeks. CBG 395. Patient also reports having blood in her urine. Patient has complex PMH listed above.  Pertinent labs: WBC 13.4. Scr 0.72 with estimated CrCl 70  Goal of Therapy:  Vancomycin trough level 10-15 mcg/ml  Plan:  Zosyn 3.375g IV q8 hrs Vancomycin 1g IV q12hrs. Trough after 3-5 doses at steady state.  Merilynn Finland, Valentin Benney Stillinger 01/19/2013,11:04 AM

## 2013-01-19 NOTE — Preoperative (Signed)
Beta Blockers   Reason not to administer Beta Blockers:Not Applicable, pt took carvedilol within past 24h.

## 2013-01-19 NOTE — ED Notes (Signed)
While in pt room, pt reports having blood in her urine that started this week.

## 2013-01-19 NOTE — H&P (Signed)
Attending physician: Dr. Rogelia Boga  Internal Medicine Teaching Service Contact Information  Weekday Hours (7AM-5PM):  1st Contact: Dr. Inocente Salles      Pager: [phone] 2nd Contact: Dr. Dow Adolph      Pager:[phone]  ** If no return call within 15 minutes (after trying both pagers listed above), please call after hours pagers.  After 5 pm or weekends: 1st Contact: Pager: [phone] 2nd Contact: Pager: [phone]  Chief Complaint: buttock pain  History of Present Illness:  [name] is a 79yo female w/ hx of HTN, DM type 2 (uncontrolled, A1c 12.8% on 12/23/12) , HL, CVA, CHF, R lung adenocarcinoma who presents for right sided buttock pain x 2 days.  Patient reports having onset of pain to R lower buttock since yesterday. She did not notice it draining any fluid nor has she had F/C, night sweats, change in appetite, malaise. Denies similar lesions elsewhere or prior similar episode. Patient c/o increased urinary frequency (once per hour??) x 1-2 weeks, denies dysuria, hematuria. Patient was seen in [facility] 12/23/2012 for dysuria and increased urinary frequency at which time she was treated for complicated UTI w/ ciprofloxacin 500mg  BID for 7 days, pt and daughter note compliance w/ this medication. Dysuria has completely resolved from the last UTI. UCx at this time grew "multiple morphotypes of bacteria, w/ no predominant type." Patient denies abd pain, N/V/D, CP, SOB, cough.   With regard to her DM, patient reports taking her lantus at home, though it is unclear if she is actually doing this as her A1c is uncontrolled and there was confusion between pt and daughters regarding her current dosing. Per chart review, patient was recently seen in [facility] at which time her lantus was increased from 10U to 15U qHS. She is also on metformin 1000mg  BID, which daughter reports she does take.   In ED, VS. CMP revealed K 3.2, glucose 414. CBC showed WBC 13.4, Hb 11.7 (approx baseline). UA was contaminated. CT  pelvis with contrast showed amorphous collection (6.7 x 3.8) within right gluteal region having appearance of phlegmon. CT also revealed findings raising concern for chronic bladder outlet obstruction as well as a filling defect in the posterior aspect of the urinary bladder. Patient received one dose of zosyn (?vanc ordered but never given, then canceled).  Of note, patient lives at home with her daughter who helps her with her medications, though neither pt nor daughter had clear picture of patient's medications. Pt ambulates at home w/ a cane and is able to perform ADLs and some IADLs per family.  Meds: Medications Prior to Admission  Medication Sig Dispense Refill  . albuterol (PROVENTIL HFA;VENTOLIN HFA) 108 (90 BASE) MCG/ACT inhaler Inhale 2 puffs into the lungs every 4 (four) hours as needed for wheezing.  3 Inhaler  1  . albuterol (PROVENTIL) (2.5 MG/3ML) 0.083% nebulizer solution Take 3 mLs (2.5 mg total) by nebulization every 6 (six) hours as needed for wheezing.  75 mL  12  . aspirin 81 MG tablet Take 81 mg by mouth daily.       . Blood Glucose Monitoring Suppl (ACCU-CHEK AVIVA PLUS) W/DEVICE KIT 1 each by Does not apply route 3 (three) times daily. dx code 250.00 insulin requiring  1 kit  1  . carvedilol (COREG) 25 MG tablet Take 1 tablet (25 mg total) by mouth 2 (two) times daily with a meal.  180 tablet  4  . [employer] LANCETS MICRO THIN 33G MISC Check blood sugar 3 times a  day before meals dx code 250.00 insulin requiring  100 each  12  . furosemide (LASIX) 20 MG tablet Take 20 mg by mouth daily.      Marland Kitchen glucose blood (ACCU-CHEK AVIVA PLUS) test strip Check blood sugar 3 times a day before meals dx code 250.00 insulin requiring  100 each  12  . HYDROcodone-acetaminophen (NORCO/VICODIN) 5-325 MG per tablet TAKE 1 TABLET BY MOUTH TWICE A DAY AS NEEDED FOR PAIN. Do not fill before 01/07/13  60 tablet  0  . Insulin Glargine (LANTUS SOLOSTAR) 100 UNIT/ML SOPN Inject 10 Units into the skin at  bedtime.  10 mL  5  . Lancet Devices (ACCU-CHEK SOFTCLIX) lancets Check blood sugar 3 times a day before meals dx code 250.00 insulin requiring  100 each  12  . lisinopril (PRINIVIL,ZESTRIL) 40 MG tablet Take 40 mg by mouth daily.      . Loratadine 10 MG CAPS Take 1 capsule (10 mg total) by mouth daily.  10 each  0  . metFORMIN (GLUCOPHAGE) 1000 MG tablet 2 times daily by mouth with food  180 tablet  4  . pantoprazole (PROTONIX) 20 MG tablet TAKE 1 TABLET (20 MG TOTAL) BY MOUTH DAILY.  30 tablet  5  . simvastatin (ZOCOR) 20 MG tablet TAKE 1 TABLET BY MOUTH EVERY DAY  90 tablet  3  . triamcinolone ointment (KENALOG) 0.1 % Apply 1 application topically 2 (two) times daily.  30 g  0    Allergies: Allergies as of 01/19/2013  . (No Known Allergies)   Past Medical History  Diagnosis Date  . Diabetes mellitus 2007    HgA1C (02/20/2010) = 9.2, HgA1C (03/20/2009) = 12.1  . Hyperlipidemia   . Hypertension   . GERD (gastroesophageal reflux disease)   . CVA (cerebrovascular accident) 2006     right embolic stroke, no residual deficits  . Diverticulitis   . CVA (cerebral infarction) 7-yrs ago  . Arthritis   . Dysphagia   . CHF (congestive heart failure)   . DIABETES MELLITUS, TYPE II 12/04/2005  . HYPERLIPIDEMIA 12/04/2005  . HYPERTENSION 12/04/2005  . GERD 12/04/2005  . ARTHRITIS, KNEE 03/24/2006  . Lung mass 07/08/2010  . Meningioma 07/21/2010  . Hemangioma of liver 12/02/2010  . Depression 12/02/2010  . Adenocarcinoma of lung      Right upper lobe adenocarcinoma.    Past Surgical History  Procedure Laterality Date  . Abdominal hysterectomy    . Video bronchoscope.  12/29/2007    [place]  . Wide excision of left upper back mass.    . Extracapsular cataract extraction with intraocular      lens implantation.  . Right vats,right thoracotomy,right lower lobectomy with node dissection     Family History  Problem Relation Age of Onset  . Hyperlipidemia Brother   . Hypertension Brother    . Diabetes Brother    History   Social History  . Marital Status: Widowed    Spouse Name: N/A    Number of Children: N/A  . Years of Education: N/A   Occupational History  . Not on file.   Social History Main Topics  . Smoking status: Former Smoker- smoked 1-2ppd x 5 years    Types: Cigarettes    Quit date: 02/17/2000  . Smokeless tobacco: Never Used  . Alcohol Use: No  . Drug Use: No  . Sexual Activity: Not on file   Other Topics Concern  . Not on file   Social History Narrative  Patient requests to use [employer] for her diabets testing supplies as of 09/05/2009.    Review of Systems: General: no fevers, chills, changes in weight, changes in appetite Skin: no rash HEENT: no blurry vision, hearing changes, sore throat Pulm: no dyspnea, coughing, wheezing CV: no chest pain, palpitations, shortness of breath Abd: no abdominal pain, nausea/vomiting, diarrhea/constipation GU: see HPI Ext: no arthralgias, myalgias Neuro: no weakness, numbness, or tingling Skin: see HPI  Physical Exam: Blood pressure 148/50, pulse 114, temperature 98 F (36.7 C), temperature source Oral, resp. rate 14, SpO2 100.00%. General: drowsy, but easily arousable to voice and able to answer questions appropriately HEENT: pupils equal round and reactive to light, vision grossly intact, oropharynx clear and non-erythematous  Neck: supple Lungs: clear to ascultation bilaterally, normal work of respiration, no wheezes, rales, ronchi Heart: regular rate and rhythm, no murmurs, gallops, or rubs Abdomen: soft, non-tender, non-distended, normal bowel sounds Extremities: warm extremities b/l, no BLE edema Skin: open surgical incision approx 3-4cm in length packed w/ gauze to base of R buttock; gauze is soaked w/ serosanguinous fluid, some TTP; there are also scattered hyperpigmented plaques w/ small amount of overlying adherent scale to bilateral lower extremities  Neurologic: alert & oriented  X3, cranial nerves II-XII grossly intact, strength grossly intact, sensation intact to light touch  Lab results: Basic Metabolic Panel:  Recent Labs  40/98/11 0925  NA 139  K 3.2*  CL 97  CO2 29  GLUCOSE 414*  BUN 16  CREATININE 0.72  CALCIUM 8.9   Liver Function Tests:  Recent Labs  01/19/13 0925  AST 10  ALT 8  ALKPHOS 85  BILITOT 0.3  PROT 7.3  ALBUMIN 3.2*   CBC:  Recent Labs  01/19/13 0925  WBC 13.4*  NEUTROABS 10.1*  HGB 11.7*  HCT 36.0  MCV 78.4  PLT 306   CBG:  Recent Labs  01/19/13 0910 01/19/13 1229 01/19/13 1657 01/19/13 1743  GLUCAP 395* 305* 179* 178*   Troponin x 1 <0.30  Urinalysis:  Recent Labs  01/19/13 1012  COLORURINE YELLOW  LABSPEC 1.040*  PHURINE 5.0  GLUCOSEU >1000*  HGBUR MODERATE*  BILIRUBINUR NEGATIVE  KETONESUR NEGATIVE  PROTEINUR NEGATIVE  UROBILINOGEN 0.2  NITRITE NEGATIVE  LEUKOCYTESUR MODERATE*   Misc. Labs: UCx pending Culture of abscess fluid pending  Imaging results:  Ct Pelvis W Contrast  01/19/2013   CLINICAL DATA:  Right buttock pain and swelling.  EXAM: CT PELVIS WITH CONTRAST  TECHNIQUE: Multidetector CT imaging of the pelvis was performed using the standard protocol following the bolus administration of intravenous contrast.  CONTRAST:  80mL OMNIPAQUE IOHEXOL 300 MG/ML  SOLN  COMPARISON:  FINDINGS:  In the right gluteal region posterior to the pain as an ill-defined a amorphous region of low attenuation is appreciated within the subcutaneous fat. This area measures 6.7 x 3.8 cm in AP by transverse dimensions. This area is not wall golf and there is inflammatory change and surrounding new mesenteric fat. Finding has the appearance of a phlegmon. Best appreciated on image 47 series 3.  The urinary bladder wall is diffusely thickened demonstrates areas of trabeculation. A bilobed filling defects identified within the posterior lateral aspect of the urinary bladder on the left image number 30 series 3  measuring 1.9 cm in longitudinal dimensions. There is no evidence of intrapelvic free fluid nor loculated fluid collections. Coarse atherosclerotic calcifications identified within the distal abdominal aorta. There is no evidence of bowel obstruction.  IMPRESSION: 1. A amorphous  collection within the right gluteal region adjacent to the gluteal fold having the appearance of a phlegmon. 2. Findings raising diagnostic concern of chronic bladder outlet obstruction. There are also findings concerning for filling defect in the posterior aspect of the urinary bladder as described above further evaluation with direct visualization recommended   Electronically Signed   By: Salome Holmes M.D.   On: 01/19/2013 14:05   Other results: EKG: NSR, prolonged QTc, normal axis, ?J point elevation V2, V3, T wave inversion aVL  Assessment & Plan by Problem:  # Abscess-right buttock: CT showed amorphous collection within the right gluteal region adjacent to the gluteal fold having the appearance of a phlegmon. Surgery was consulted and I&D was performed. Patient tolerated the procedure well; wound now packed w/ gauze. Currently patient is hemodynamically stable and afebrile. Her pain is well controlled. Patient does not meet the criteria for sepsis. -Admit to Med Surg  -Appreciate surgeon's consultation in management of a patient  -Continue IV Zosyn per surgeon -pending wound culture and blood culture x 2 -Oxycodone 5 to 10 mg q4h PRN for pain  -IVF fluid: NS 75 cc/h  Hypokalemia: K 3.2 on admission. Likely due to lasix therapy. Pt is not ok KCl supplementation at home. -Kdur x 1 dose   EKG changes: EKG w/ ?J point elevation V2, V3, T wave inversion aVL. Patient w/o chest pain or SOB at this time. Given patient's hx of CHF and DM, we wanted to evaluated further w/ one troponin.  -Troponin x 1 neg   # Bladder outlet obstruction: CT scan showed possible filling defect in the posterior aspect of the urinary  bladder and concern of chronic bladder outlet obstruction. Patient was recently treated for UTI with ciprofloxacin. Per patient's daughter, patient's dysuria resolved, but patient still has increased urinary frequency (1-2 weeks). Initial urine collection was not a clean catch. It is possible that patient has recurrent UTI. Patient is being treated w/ zosyn for abscess, which should also treat a UTI if present. Patient symptoms might be related to the findings on CT scan.  -repeat UA -UCx pending -Urology referral as outpatient workup for possible chronic bladder outlet obstruction  # DM2: Last Hgb A1C of 12.1% on 12/23/12. Patient was supposed to be on Lantus 10 units at bedtime daily and metformin 1000 mg twice a day at home. It seems that patient may not have been completely compliant to these regimen. She is currently living with her daughter, who is not very sure whether patient is doing insulin injection correctly. -Lantus 8 units qHS  -SSI   # HTN: BP is 131/64 mmHg on admission. She is on lisinopril 40mg  daily and Coreg 25 mg at home.  -will continue home regimen  # History of RLL adenocarcinoma: She is s/p right lower lobectomy 12/15/10. Her recent chest CT on 05/07/12 showed small left pleural effusion and no evidence of pleural effusion on the right. Patient was followed up by oncology, Dr. Arbutus Ped in the past. Patient has  Not followed up with oncologist since 07/2011. Currently patient does not have cough, shortness of breath or chest pain. She is using albuterol inhaler prn at home.  -Continue Albuterol inhaler PRN   # Chronic sCHF: Echo on 06/2010 with EF of 15-20% and Myoview showing thinning of apex, however, myoview on 12/12/10 with improved EF of 40% with nonischemic CM. Patient is on Lasix 20 mg daily, aspirin, Coreg 25 mg twice a day, lisinopril 40 mg daily at home. Weight  was 205lbs on 12/23/12-->207lbs today. Clinically patient is euvolemic without JVD and the leg edema and lungs  CTAB, therefore no concern for acute exacerbation at this time. -Hold lasix given patient is at risk of developing sepsis -Judicious use of IVF  -Continue ACEi, BB, and ASA as well as statin  -Daily weights   # Meningioma: Patient's MRI on 10/2010 showed stable right frontal lobe meningioma compared with MRI on MRI 07/08/2010. Patient does not have an focal neurologic signs.  -may need follow up as outpt  # DVT prophylaxis: Heparin sq TID   Dispo: Disposition is deferred at this time, awaiting improvement of current medical problems. Anticipated discharge in approximately 1-2 day(s).   The patient does have a current PCP [name] Palmer, MD) and does need an [facility] hospital follow-up appointment after discharge.  The patient does not have transportation limitations that hinder transportation to clinic appointments.  Signed: Windell Hummingbird, MD 01/19/2013, 7:17 PM

## 2013-01-19 NOTE — ED Provider Notes (Signed)
I saw and evaluated the patient, reviewed the resident's note and I agree with the findings and plan. If applicable, I agree with the resident's interpretation of the EKG.  If applicable, I was present for critical portions of any procedures performed.  R buttock abscess. 5x8 cm area of induration and fluctuance.  No fever.  No vomiting. Hyperglycemia without evidence of DKA.  Will need drainage in OR given size.  Medicine admit for uncontrolled blood sugars and antibiotics.  Glynn Octave, MD 01/19/13 [phone]

## 2013-01-19 NOTE — ED Notes (Signed)
NP from surgery at bedside.

## 2013-01-19 NOTE — Anesthesia Procedure Notes (Signed)
Procedure Name: Intubation Date/Time: 01/19/2013 6:07 PM Performed by: Angelica Pou Pre-anesthesia Checklist: Patient identified, Patient being monitored, Emergency Drugs available, Timeout performed and Suction available Patient Re-evaluated:Patient Re-evaluated prior to inductionOxygen Delivery Method: Circle system utilized Preoxygenation: Pre-oxygenation with 100% oxygen Intubation Type: IV induction and Rapid sequence Laryngoscope Size: Mac and 3 Grade View: Grade I Tube type: Oral Tube size: 7.0 mm Number of attempts: 1 Airway Equipment and Method: Stylet Placement Confirmation: ETT inserted through vocal cords under direct vision,  breath sounds checked- equal and bilateral and positive ETCO2 Secured at: 22 cm Tube secured with: Tape Dental Injury: Teeth and Oropharynx as per pre-operative assessment

## 2013-01-19 NOTE — Anesthesia Preprocedure Evaluation (Addendum)
Anesthesia Evaluation  Patient identified by MRN, date of birth, ID band Patient awake    Reviewed: Allergy & Precautions, H&P , NPO status , Patient's Chart, lab work & pertinent test results, reviewed documented beta blocker date and time   History of Anesthesia Complications Negative for: history of anesthetic complications  Airway Mallampati: II TM Distance: >3 FB Neck ROM: Full    Dental  (+) Edentulous Upper and Edentulous Lower   Pulmonary COPD COPD inhaler, former smoker,  Lung mass: adenoca breath sounds clear to auscultation  Pulmonary exam normal       Cardiovascular hypertension, Pt. on medications and Pt. on home beta blockers +CHF Rhythm:Regular Rate:Normal  '12 stress test: low risk, no ischemia, EF 40%   Neuro/Psych H/o meningioma CVA, Residual Symptoms    GI/Hepatic Neg liver ROS, GERD-  Medicated and Poorly Controlled,  Endo/Other  diabetes (glu 179), Oral Hypoglycemic Agents and Insulin DependentMorbid obesity  Renal/GU negative Renal ROS     Musculoskeletal   Abdominal (+) + obese,   Peds  Hematology   Anesthesia Other Findings   Reproductive/Obstetrics                          Anesthesia Physical Anesthesia Plan  ASA: III  Anesthesia Plan: General   Post-op Pain Management:    Induction: Intravenous and Rapid sequence  Airway Management Planned: Oral ETT  Additional Equipment:   Intra-op Plan:   Post-operative Plan: Extubation in OR  Informed Consent: I have reviewed the patients History and Physical, chart, labs and discussed the procedure including the risks, benefits and alternatives for the proposed anesthesia with the patient or authorized representative who has indicated his/her understanding and acceptance.     Plan Discussed with: CRNA and Surgeon  Anesthesia Plan Comments: (Plan routine monitors, GETA)        Anesthesia Quick Evaluation

## 2013-01-19 NOTE — ED Notes (Signed)
i stat blood gas shown to Crecencio Mc, Charity fundraiser

## 2013-01-19 NOTE — Consult Note (Signed)
Right perirectal abscess, fluctuant and tender on palpation. Will proceed to OR tonight for I&D. Procedure/risks/and benefits D/W patient and her granddaughter - she agrees. Patient examined and I agree with the assessment and plan  Violeta Gelinas, MD, MPH, FACS Pager: [phone]  01/19/2013 4:50 PM

## 2013-01-19 NOTE — ED Notes (Signed)
Per PTAR, pt called to home for sore on her bottom that appeared x2 days ago. PTAR reports "looks like a boil." While at pt house, CBG read "HIGH." Pt c/o pain in bottom but has no other complaints.

## 2013-01-19 NOTE — ED Notes (Signed)
Patient transported to CT 

## 2013-01-19 NOTE — Transfer of Care (Signed)
Immediate Anesthesia Transfer of Care Note  Patient: [name]  Procedure(s) Performed: Procedure(s): IRRIGATION AND DEBRIDEMENT PERIRECTAL ABSCESS (N/A)  Patient Location: PACU  Anesthesia Type:General  Level of Consciousness: awake, alert , oriented and pateint uncooperative  Airway & Oxygen Therapy: Patient Spontanous Breathing and Patient connected to nasal cannula oxygen  Post-op Assessment: Report given to PACU RN, Post -op Vital signs reviewed and stable and Patient moving all extremities  Post vital signs: Reviewed and stable  Complications: No apparent anesthesia complications

## 2013-01-19 NOTE — Consult Note (Signed)
Reason for Consult: perirectal abscess Referring Physician: Glynn Octave   HPI: [name] is a 79 year old female with a history of CVA, uncontrolled diabetes mellitus, dilated CMP, HTN, GERD, depression, adenocarcinoma of lung who presented to [facility] this morning with right buttock pain.  Duration of symptoms is 2 days ago.  Time pattern; constant, pain is worse with lying in supine position.  Location; right perirectal region.  Severity of symptoms is mild.   Associating factors; none.  Modifying factors; Ambien which helped alleviate the pain.  No aggravating factors.  No alleviating factors.  She reports that it spontaneously drained about an hour ago.  She denies use of blood thinners, NSAIDs. She denies chest pains, DOE, palpitations.  Her CT showed a large right perirectal abscess.  She has a WBC count of 13.4K, afebrile, no tachycardia or hypotension.  She does not appear septic. Her BG is 414, negative ketones.     Past Medical History  Diagnosis Date  . Diabetes mellitus 2007    HgA1C (02/20/2010) = 9.2, HgA1C (03/20/2009) = 12.1  . Hyperlipidemia   . Hypertension   . GERD (gastroesophageal reflux disease)   . CVA (cerebrovascular accident) 2006     right embolic stroke, no residual deficits  . Diverticulitis   . CVA (cerebral infarction) 7-yrs ago  . Arthritis   . Dysphagia   . CHF (congestive heart failure)   . DIABETES MELLITUS, TYPE II 12/04/2005  . HYPERLIPIDEMIA 12/04/2005  . HYPERTENSION 12/04/2005  . GERD 12/04/2005  . ARTHRITIS, KNEE 03/24/2006  . Lung mass 07/08/2010  . Meningioma 07/21/2010  . Hemangioma of liver 12/02/2010  . Depression 12/02/2010  . Adenocarcinoma of lung      Right upper lobe adenocarcinoma.     Past Surgical History  Procedure Laterality Date  . Abdominal hysterectomy    . Video bronchoscope.  12/29/2007    [place]  . Wide excision of left upper back mass.    . Extracapsular cataract extraction with intraocular      lens implantation.   . Right vats,right thoracotomy,right lower lobectomy with node dissection      Family History  Problem Relation Age of Onset  . Hyperlipidemia Brother   . Hypertension Brother   . Diabetes Brother     Social History:  reports that she quit smoking about 12 years ago. Her smoking use included Cigarettes. She smoked 0.00 packs per day. She has never used smokeless tobacco. She reports that she does not drink alcohol or use illicit drugs.  Allergies: No Known Allergies  Medications: {medication reviewed  Results for orders placed during the hospital encounter of 01/19/13 (from the past 48 hour(s))  GLUCOSE, CAPILLARY     Status: Abnormal   Collection Time    01/19/13  9:10 AM      Result Value Range   Glucose-Capillary 395 (*) 70 - 99 mg/dL  CBC WITH DIFFERENTIAL     Status: Abnormal   Collection Time    01/19/13  9:25 AM      Result Value Range   WBC 13.4 (*) 4.0 - 10.5 K/uL   RBC 4.59  3.87 - 5.11 MIL/uL   Hemoglobin 11.7 (*) 12.0 - 15.0 g/dL   HCT 28.4  13.2 - 44.0 %   MCV 78.4  78.0 - 100.0 fL   MCH 25.5 (*) 26.0 - 34.0 pg   MCHC 32.5  30.0 - 36.0 g/dL   RDW 10.2  72.5 - 36.6 %   Platelets 306  150 - 400 K/uL   Neutrophils Relative % 76  43 - 77 %   Neutro Abs 10.1 (*) 1.7 - 7.7 K/uL   Lymphocytes Relative 13  12 - 46 %   Lymphs Abs 1.8  0.7 - 4.0 K/uL   Monocytes Relative 11  3 - 12 %   Monocytes Absolute 1.4 (*) 0.1 - 1.0 K/uL   Eosinophils Relative 1  0 - 5 %   Eosinophils Absolute 0.1  0.0 - 0.7 K/uL   Basophils Relative 0  0 - 1 %   Basophils Absolute 0.0  0.0 - 0.1 K/uL  COMPREHENSIVE METABOLIC PANEL     Status: Abnormal   Collection Time    01/19/13  9:25 AM      Result Value Range   Sodium 139  135 - 145 mEq/L   Potassium 3.2 (*) 3.5 - 5.1 mEq/L   Chloride 97  96 - 112 mEq/L   CO2 29  19 - 32 mEq/L   Glucose, Bld 414 (*) 70 - 99 mg/dL   BUN 16  6 - 23 mg/dL   Creatinine, Ser 1.61  0.50 - 1.10 mg/dL   Calcium 8.9  8.4 - 09.6 mg/dL   Total Protein 7.3   6.0 - 8.3 g/dL   Albumin 3.2 (*) 3.5 - 5.2 g/dL   AST 10  0 - 37 U/L   ALT 8  0 - 35 U/L   Alkaline Phosphatase 85  39 - 117 U/L   Total Bilirubin 0.3  0.3 - 1.2 mg/dL   GFR calc non Af Amer 81 (*) >90 mL/min   GFR calc Af Amer >90  >90 mL/min   Comment: (NOTE)     The eGFR has been calculated using the CKD EPI equation.     This calculation has not been validated in all clinical situations.     eGFR's persistently <90 mL/min signify possible Chronic Kidney     Disease.  KETONES, QUALITATIVE     Status: None   Collection Time    01/19/13  9:25 AM      Result Value Range   Acetone, Bld NEGATIVE  NEGATIVE  POCT I-STAT 3, BLOOD GAS (G3P V)     Status: Abnormal   Collection Time    01/19/13  9:43 AM      Result Value Range   pH, Ven 7.459 (*) 7.250 - 7.300   pCO2, Ven 46.3  45.0 - 50.0 mmHg   pO2, Ven 25.0 (*) 30.0 - 45.0 mmHg   Bicarbonate 32.8 (*) 20.0 - 24.0 mEq/L   TCO2 34  0 - 100 mmol/L   O2 Saturation 49.0     Acid-Base Excess 8.0 (*) 0.0 - 2.0 mmol/L   Sample type VENOUS     Comment NOTIFIED PHYSICIAN    URINALYSIS, ROUTINE W REFLEX MICROSCOPIC     Status: Abnormal   Collection Time    01/19/13 10:12 AM      Result Value Range   Color, Urine YELLOW  YELLOW   APPearance CLOUDY (*) CLEAR   Specific Gravity, Urine 1.040 (*) 1.005 - 1.030   pH 5.0  5.0 - 8.0   Glucose, UA >1000 (*) NEGATIVE mg/dL   Hgb urine dipstick MODERATE (*) NEGATIVE   Bilirubin Urine NEGATIVE  NEGATIVE   Ketones, ur NEGATIVE  NEGATIVE mg/dL   Protein, ur NEGATIVE  NEGATIVE mg/dL   Urobilinogen, UA 0.2  0.0 - 1.0 mg/dL   Nitrite NEGATIVE  NEGATIVE  Leukocytes, UA MODERATE (*) NEGATIVE  URINE MICROSCOPIC-ADD ON     Status: Abnormal   Collection Time    01/19/13 10:12 AM      Result Value Range   Squamous Epithelial / LPF MANY (*) RARE   WBC, UA TOO NUMEROUS TO COUNT  <3 WBC/hpf   RBC / HPF 0-2  <3 RBC/hpf   Bacteria, UA MANY (*) RARE   Urine-Other FEW YEAST    POCT I-STAT 3, BLOOD GAS  (G3+)     Status: Abnormal   Collection Time    01/19/13 11:02 AM      Result Value Range   pH, Arterial 7.460 (*) 7.350 - 7.450   pCO2 arterial 39.9  35.0 - 45.0 mmHg   pO2, Arterial 68.0 (*) 80.0 - 100.0 mmHg   Bicarbonate 28.4 (*) 20.0 - 24.0 mEq/L   TCO2 30  0 - 100 mmol/L   O2 Saturation 94.0     Acid-Base Excess 4.0 (*) 0.0 - 2.0 mmol/L   Collection site RADIAL, ALLEN'S TEST ACCEPTABLE     Drawn by RT     Sample type ARTERIAL    GLUCOSE, CAPILLARY     Status: Abnormal   Collection Time    01/19/13 12:29 PM      Result Value Range   Glucose-Capillary 305 (*) 70 - 99 mg/dL    Ct Pelvis W Contrast  01/19/2013   CLINICAL DATA:  Right buttock pain and swelling.  EXAM: CT PELVIS WITH CONTRAST  TECHNIQUE: Multidetector CT imaging of the pelvis was performed using the standard protocol following the bolus administration of intravenous contrast.  CONTRAST:  80mL OMNIPAQUE IOHEXOL 300 MG/ML  SOLN  COMPARISON:  FINDINGS:  In the right gluteal region posterior to the pain as an ill-defined a amorphous region of low attenuation is appreciated within the subcutaneous fat. This area measures 6.7 x 3.8 cm in AP by transverse dimensions. This area is not wall golf and there is inflammatory change and surrounding new mesenteric fat. Finding has the appearance of a phlegmon. Best appreciated on image 47 series 3.  The urinary bladder wall is diffusely thickened demonstrates areas of trabeculation. A bilobed filling defects identified within the posterior lateral aspect of the urinary bladder on the left image number 30 series 3 measuring 1.9 cm in longitudinal dimensions. There is no evidence of intrapelvic free fluid nor loculated fluid collections. Coarse atherosclerotic calcifications identified within the distal abdominal aorta. There is no evidence of bowel obstruction.  IMPRESSION: 1. A amorphous collection within the right gluteal region adjacent to the gluteal fold having the appearance of a  phlegmon. 2. Findings raising diagnostic concern of chronic bladder outlet obstruction. There are also findings concerning for filling defect in the posterior aspect of the urinary bladder as described above further evaluation with direct visualization recommended   Electronically Signed   By: Salome Holmes M.D.   On: 01/19/2013 14:05    Review of Systems  Constitutional: Negative for chills and malaise/fatigue.  Respiratory: Negative for shortness of breath and wheezing.   Cardiovascular: Negative for chest pain and palpitations.  Gastrointestinal: Negative for nausea, vomiting and abdominal pain.  Genitourinary: Positive for dysuria, urgency and frequency. Negative for hematuria and flank pain.  Musculoskeletal: Negative for myalgias.  Neurological: Negative for dizziness, weakness and headaches.   Blood pressure 159/56, pulse 94, temperature 98.8 F (37.1 C), temperature source Oral, resp. rate 27, SpO2 98.00%. Physical Exam  Constitutional: She appears well-developed and well-nourished. No distress.  HENT:  Head: Normocephalic and atraumatic.  Neck: Neck supple.  Cardiovascular: Normal rate, regular rhythm, normal heart sounds and intact distal pulses.  Exam reveals no friction rub.   No murmur heard. Respiratory: Effort normal and breath sounds normal. No respiratory distress. She has no wheezes. She has no rales. She exhibits no tenderness.  GI: Soft. Bowel sounds are normal. She exhibits no distension. There is no tenderness.  Musculoskeletal:  Unsteady, walks with a cane  Skin: Skin is warm and dry. She is not diaphoretic.  8x5cm right gluteal abscess which does not appear to affect the anus.  It is indurated, fluctuant, there is minimal purulent drainage which spontaneously drained 1 hour ago according to the family.   Psychiatric: She has a normal mood and affect. Her behavior is normal. Judgment and thought content normal.    Assessment/Plan: 79 year old female with a hx of  CVA, uncontrolled diabetes mellitus, dilated CMP, HTN, GERD, depression, adenocarcinoma of lung  Right perirectal abscess Proceed with a incision and drainage in the OR this evening with Dr. Janee Morn.  We discussed in detail the risks and complications of surgery with the patient and her daughter and niece.  They wish to proceed.  Keep NPO, gentle hydration per primary team with hx of CMP.  Continue with zosyn, DC vanc for now.  Obtain a pre-op EKG.  She needs improved glucose control, IM to admit.    Jaimey Franchini  ANP-BC  01/19/2013, 2:33 PM

## 2013-01-19 NOTE — ED Provider Notes (Signed)
CSN: [id number]     Arrival date & time 01/19/13  0904 History   None    Chief Complaint  Patient presents with  . Hyperglycemia  . Sore    on bottom   HPI 79 year old woman with PMH uncontrolled DM2, CHF, HTN, HL, CVA who presents with pain in her right buttocks.     Patient states that approximately 2 weeks ago, she started having pain in the right side of her bottom.  She thinks it may have been draining because it has felt "a little wet" but she can not provide further details and wears a diaper.   No fever, chest pain, shortness of breath, abdominal pain, no change in bowel or bladder habits.  Denies dyuria or frequency.  She does ambulate at home where she lives with her daughter.   CBG on arrive 395, patient states she has been checking her BGs at home which have been running 250-280.  Past Medical History  Diagnosis Date  . Diabetes mellitus 2007    HgA1C (02/20/2010) = 9.2, HgA1C (03/20/2009) = 12.1  . Hyperlipidemia   . Hypertension   . GERD (gastroesophageal reflux disease)   . CVA (cerebrovascular accident) 2006     right embolic stroke, no residual deficits  . Diverticulitis   . CVA (cerebral infarction) 7-yrs ago  . Arthritis   . Dysphagia   . CHF (congestive heart failure)   . DIABETES MELLITUS, TYPE II 12/04/2005  . HYPERLIPIDEMIA 12/04/2005  . HYPERTENSION 12/04/2005  . GERD 12/04/2005  . ARTHRITIS, KNEE 03/24/2006  . Lung mass 07/08/2010  . Meningioma 07/21/2010  . Hemangioma of liver 12/02/2010  . Depression 12/02/2010  . Adenocarcinoma of lung      Right upper lobe adenocarcinoma.    Past Surgical History  Procedure Laterality Date  . Abdominal hysterectomy    . Video bronchoscope.  12/29/2007    [place]  . Wide excision of left upper back mass.    . Extracapsular cataract extraction with intraocular      lens implantation.  . Right vats,right thoracotomy,right lower lobectomy with node dissection     Family History  Problem Relation Age of Onset   . Hyperlipidemia Brother   . Hypertension Brother   . Diabetes Brother    History  Substance Use Topics  . Smoking status: Former Smoker    Types: Cigarettes    Quit date: 02/17/2000  . Smokeless tobacco: Never Used  . Alcohol Use: No   OB History   Grav Para Term Preterm Abortions TAB SAB Ect Mult Living                 Review of Systems  Constitutional: Negative for fever and chills.  HENT: Negative for congestion.   Respiratory: Negative for cough, chest tightness and shortness of breath.   Cardiovascular: Negative for chest pain and leg swelling.  Gastrointestinal: Negative for nausea, vomiting, abdominal pain, diarrhea, constipation and blood in stool.  Genitourinary: Negative for dysuria and frequency.  Musculoskeletal: Negative for back pain.  Neurological: Negative for dizziness, syncope and headaches.    Allergies  Review of patient's allergies indicates no known allergies.  Home Medications   Current Outpatient Rx  Name  Route  Sig  Dispense  Refill  . albuterol (PROVENTIL HFA;VENTOLIN HFA) 108 (90 BASE) MCG/ACT inhaler   Inhalation   Inhale 2 puffs into the lungs every 4 (four) hours as needed for wheezing.   3 Inhaler   1   .  albuterol (PROVENTIL) (2.5 MG/3ML) 0.083% nebulizer solution   Nebulization   Take 3 mLs (2.5 mg total) by nebulization every 6 (six) hours as needed for wheezing.   75 mL   12   . aspirin 81 MG tablet   Oral   Take 81 mg by mouth daily.           . Blood Glucose Monitoring Suppl (ACCU-CHEK AVIVA PLUS) W/DEVICE KIT   Does not apply   1 each by Does not apply route 3 (three) times daily. dx code 250.00 insulin requiring   1 kit   1   . carvedilol (COREG) 25 MG tablet   Oral   Take 1 tablet (25 mg total) by mouth 2 (two) times daily with a meal.   180 tablet   4   . [employer] LANCETS MICRO THIN 33G MISC      Check blood sugar 3 times a day before meals dx code 250.00 insulin requiring   100 each   12   . Flexpen  Starter Kit MISC   Other   1 kit by Other route once.   1 each   0   . glucose blood (ACCU-CHEK AVIVA PLUS) test strip      Check blood sugar 3 times a day before meals dx code 250.00 insulin requiring   100 each   12   . HYDROcodone-acetaminophen (NORCO/VICODIN) 5-325 MG per tablet      TAKE 1 TABLET BY MOUTH TWICE A DAY AS NEEDED FOR PAIN. Do not fill before 01/07/13   60 tablet   0     Do not fill before 01/07/13   . Insulin Glargine (LANTUS SOLOSTAR) 100 UNIT/ML SOPN   Subcutaneous   Inject 10 Units into the skin at bedtime.   10 mL   5   . Lancet Devices (ACCU-CHEK SOFTCLIX) lancets      Check blood sugar 3 times a day before meals dx code 250.00 insulin requiring   100 each   12   . lisinopril (PRINIVIL,ZESTRIL) 40 MG tablet   Oral   Take 40 mg by mouth daily.         . Loratadine 10 MG CAPS   Oral   Take 1 capsule (10 mg total) by mouth daily.   10 each   0   . metFORMIN (GLUCOPHAGE) 1000 MG tablet      2 times daily by mouth with food   180 tablet   4   . pantoprazole (PROTONIX) 20 MG tablet      TAKE 1 TABLET (20 MG TOTAL) BY MOUTH DAILY.   30 tablet   5   . simvastatin (ZOCOR) 20 MG tablet      TAKE 1 TABLET BY MOUTH EVERY DAY   90 tablet   3   . triamcinolone ointment (KENALOG) 0.1 %   Topical   Apply 1 application topically 2 (two) times daily.   30 g   0    BP 159/56  Pulse 94  Temp(Src) 98.8 F (37.1 C) (Oral)  Resp 27  SpO2 98% Physical Exam  Constitutional: She is oriented to person, place, and time. She appears well-developed and well-nourished. No distress.  HENT:  Head: Normocephalic and atraumatic.  Eyes: Conjunctivae and EOM are normal. Pupils are equal, round, and reactive to light.  Neck: Normal range of motion. Neck supple.  Cardiovascular: Normal rate, regular rhythm and normal heart sounds.   Pulmonary/Chest: Effort normal and breath sounds normal.  Abdominal: Soft. Bowel sounds are normal. She exhibits no  distension. There is no tenderness.  Neurological: She is alert and oriented to person, place, and time. No cranial nerve deficit.  Skin:  Significant area of induration (~8 x 3 cm) on right side of gluteal cleft. No erythema or drainage, pustule seen.    ED Course  Procedures (including critical care time) Labs Review Labs Reviewed  GLUCOSE, CAPILLARY - Abnormal; Notable for the following:    Glucose-Capillary 395 (*)    All other components within normal limits  CBC WITH DIFFERENTIAL - Abnormal; Notable for the following:    WBC 13.4 (*)    Hemoglobin 11.7 (*)    MCH 25.5 (*)    Neutro Abs 10.1 (*)    Monocytes Absolute 1.4 (*)    All other components within normal limits  COMPREHENSIVE METABOLIC PANEL - Abnormal; Notable for the following:    Potassium 3.2 (*)    Glucose, Bld 414 (*)    Albumin 3.2 (*)    GFR calc non Af Amer 81 (*)    All other components within normal limits  URINALYSIS, ROUTINE W REFLEX MICROSCOPIC - Abnormal; Notable for the following:    APPearance CLOUDY (*)    Specific Gravity, Urine 1.040 (*)    Glucose, UA >1000 (*)    Hgb urine dipstick MODERATE (*)    Leukocytes, UA MODERATE (*)    All other components within normal limits  URINE MICROSCOPIC-ADD ON - Abnormal; Notable for the following:    Squamous Epithelial / LPF MANY (*)    Bacteria, UA MANY (*)    All other components within normal limits  GLUCOSE, CAPILLARY - Abnormal; Notable for the following:    Glucose-Capillary 305 (*)    All other components within normal limits  POCT I-STAT 3, BLOOD GAS (G3P V) - Abnormal; Notable for the following:    pH, Ven 7.459 (*)    pO2, Ven 25.0 (*)    Bicarbonate 32.8 (*)    Acid-Base Excess 8.0 (*)    All other components within normal limits  POCT I-STAT 3, BLOOD GAS (G3+) - Abnormal; Notable for the following:    pH, Arterial 7.460 (*)    pO2, Arterial 68.0 (*)    Bicarbonate 28.4 (*)    Acid-Base Excess 4.0 (*)    All other components within  normal limits  URINE CULTURE  KETONES, QUALITATIVE    EKG Interpretation   None       MDM  Right buttocks abscess- in gluteal region.  CT pelvis with contrast showed amorphous collection (6.7 x 3.8) within right gluteal region having appearance of phlegmon.  Afebrile, VSS but WBC 13.4; pt does not meet SIRS criteria.  UA dirty but with moderate leucocytes and moderate heme.  Patient was treated with cipro for UTI at office visit on 12/23/12. Started vanc-zosyn.  Patient has uncontrolled DM2 (A1C 12.8 on 12/23/12), glucose 395 on presentation today. Admit to IMTS with consult to general surgery for I&D of perirectal abscess.    Rocco Serene, MD 01/19/13 1156  Rocco Serene, MD 01/19/13 [phone]

## 2013-01-20 ENCOUNTER — Encounter (HOSPITAL_COMMUNITY): Payer: Self-pay | Admitting: General Surgery

## 2013-01-20 DIAGNOSIS — L0231 Cutaneous abscess of buttock: Secondary | ICD-10-CM

## 2013-01-20 DIAGNOSIS — L259 Unspecified contact dermatitis, unspecified cause: Secondary | ICD-10-CM | POA: Diagnosis not present

## 2013-01-20 DIAGNOSIS — E876 Hypokalemia: Secondary | ICD-10-CM | POA: Diagnosis not present

## 2013-01-20 DIAGNOSIS — A4902 Methicillin resistant Staphylococcus aureus infection, unspecified site: Secondary | ICD-10-CM

## 2013-01-20 DIAGNOSIS — N179 Acute kidney failure, unspecified: Secondary | ICD-10-CM

## 2013-01-20 DIAGNOSIS — E119 Type 2 diabetes mellitus without complications: Secondary | ICD-10-CM

## 2013-01-20 LAB — BASIC METABOLIC PANEL
Calcium: 8.5 mg/dL (ref 8.4–10.5)
Creatinine, Ser: 1.01 mg/dL (ref 0.50–1.10)
GFR calc non Af Amer: 52 mL/min — ABNORMAL LOW (ref >90)
Potassium: 3.2 mEq/L — ABNORMAL LOW (ref 3.5–5.1)
Sodium: 142 mEq/L (ref 135–145)

## 2013-01-20 LAB — CBC
MCH: 24.5 pg — ABNORMAL LOW (ref 26.0–34.0)
MCHC: 30.5 g/dL (ref 30.0–36.0)
Platelets: 292 10*3/uL (ref 150–400)

## 2013-01-20 LAB — URINE CULTURE

## 2013-01-20 LAB — URINALYSIS, ROUTINE W REFLEX MICROSCOPIC
Ketones, ur: 15 mg/dL — AB
Nitrite: NEGATIVE
Protein, ur: 100 mg/dL — AB
Urobilinogen, UA: 1 mg/dL (ref 0.0–1.0)
pH: 5 (ref 5.0–8.0)

## 2013-01-20 LAB — GLUCOSE, CAPILLARY
Glucose-Capillary: 279 mg/dL — ABNORMAL HIGH (ref 70–99)
Glucose-Capillary: 183 mg/dL — ABNORMAL HIGH (ref 70–99)

## 2013-01-20 LAB — URINE MICROSCOPIC-ADD ON

## 2013-01-20 MED ORDER — PNEUMOCOCCAL VAC POLYVALENT 25 MCG/0.5ML IJ INJ
0.5000 mL | INJECTION | INTRAMUSCULAR | Status: AC
  Administered 2013-01-21: 0.5 mL via INTRAMUSCULAR
  Filled 2013-01-20: qty 0.5

## 2013-01-20 MED ORDER — ONDANSETRON HCL 4 MG/2ML IJ SOLN
4.0000 mg | Freq: Three times a day (TID) | INTRAMUSCULAR | Status: DC | PRN
  Administered 2013-01-25 (×2): 4 mg via INTRAVENOUS
  Filled 2013-01-20 (×2): qty 2

## 2013-01-20 MED ORDER — INFLUENZA VAC SPLIT QUAD 0.5 ML IM SUSP
0.5000 mL | INTRAMUSCULAR | Status: AC
  Administered 2013-01-21: 0.5 mL via INTRAMUSCULAR
  Filled 2013-01-20: qty 0.5

## 2013-01-20 MED ORDER — INSULIN GLARGINE 100 UNIT/ML ~~LOC~~ SOLN
5.0000 [IU] | Freq: Every day | SUBCUTANEOUS | Status: DC
  Administered 2013-01-20 – 2013-01-26 (×7): 5 [IU] via SUBCUTANEOUS
  Filled 2013-01-20 (×8): qty 0.05

## 2013-01-20 MED ORDER — ONDANSETRON HCL 4 MG/2ML IJ SOLN
4.0000 mg | Freq: Four times a day (QID) | INTRAMUSCULAR | Status: DC | PRN
  Administered 2013-01-20: 4 mg via INTRAVENOUS
  Filled 2013-01-20: qty 2

## 2013-01-20 NOTE — Procedures (Signed)
Procedure Note for Skin Biopsy  Site: left thigh Size of growth:  multiple violaceous macules of varying sizes  Consent was signed.  The skin was cleaned with betadine, and local anesthesia with lidocaine with epinephrine was obtained.  The specimen was collected with a 4mm punch biopsy and sent to pathology.  There was minimal blood loss and site was closed with hemostasis with a 3-0 nylon.  The patient tolerated the procedure well.  Assessment:   1. Status-post biopsy  Plan: 1. Wound care reviewed with the patient. 2. Follow-up to be scheduled when the pathology results are available.

## 2013-01-20 NOTE — Progress Notes (Signed)
Patient seen earlier today - doing well, sitting up in a chair and eating breakdfast, no apparent concerns . Feels better than pre-op. Nurses to start wound care, dressing changes later today

## 2013-01-20 NOTE — Progress Notes (Addendum)
Subjective:  Pt reports that she had an episode of vomiting this AM after she ate breakfast.  Otherwise she has no other complaints.  She is a rather poor historian and is slow to respond to questions.  She denies any dysuria or other urinary symptoms.   Objective: Vital signs in last 24 hours: Filed Vitals:   01/20/13 0130 01/20/13 0506 01/20/13 0727 01/20/13 1031  BP: 111/46 148/63  121/63  Pulse: 104 99  85  Temp: 99.8 F (37.7 C) 98.8 F (37.1 C)  98.2 F (36.8 C)  TempSrc: Oral Oral  Oral  Resp: 16 18  18   Height:      Weight:   91.989 kg (202 lb 12.8 oz)   SpO2: 95% 100%  96%   Weight change:   Intake/Output Summary (Last 24 hours) at 01/20/13 1054 Last data filed at 01/19/13 1841  Gross per 24 hour  Intake    500 ml  Output      0 ml  Net    500 ml   Physical Exam:  Blood pressure 148/50, pulse 114, temperature 98 F (36.7 C), temperature source Oral, resp. rate 14, SpO2 100.00%.  General: drowsy, but easily arousable to voice and is slow to respond to questions   HEENT: pupils equal round and reactive to light, vision grossly intact, oropharynx clear and non-erythematous  Neck: supple Lungs: clear to ascultation bilaterally, normal work of respiration, no wheezes, rales, ronchi Heart: regular rate and rhythm, no murmurs, gallops, or rubs Abdomen: soft, non-tender, non-distended, normal bowel sounds; no suprapubic tenderness  Extremities: warm extremities b/l, no BLE edema  Skin: open surgical incision approx 3-4cm in length packed w/ gauze to base of R buttock; gauze is soaked w/ serosanguinous fluid, some TTP; there are also scattered hyperpigmented plaques w/ small amount of overlying adherent scale to bilateral lower extremities  Neurologic: alert & oriented X3, cranial nerves II-XII grossly intact, strength grossly intact, sensation intact to light touch   Lab Results: Basic Metabolic Panel:  Recent Labs Lab 01/19/13 0925 01/20/13 0532  NA 139 142  K  3.2* 3.2*  CL 97 105  CO2 29 26  GLUCOSE 414* 271*  BUN 16 17  CREATININE 0.72 1.01  CALCIUM 8.9 8.5   Liver Function Tests:  Recent Labs Lab 01/19/13 0925  AST 10  ALT 8  ALKPHOS 85  BILITOT 0.3  PROT 7.3  ALBUMIN 3.2*   No results found for this basename: LIPASE, AMYLASE,  in the last 168 hours No results found for this basename: AMMONIA,  in the last 168 hours CBC:  Recent Labs Lab 01/19/13 0925 01/20/13 0532  WBC 13.4* 15.0*  NEUTROABS 10.1*  --   HGB 11.7* 10.6*  HCT 36.0 34.7*  MCV 78.4 80.3  PLT 306 292   Cardiac Enzymes:  Recent Labs Lab 01/19/13 2225  TROPONINI <0.30   BNP: No results found for this basename: PROBNP,  in the last 168 hours D-Dimer: No results found for this basename: DDIMER,  in the last 168 hours CBG:  Recent Labs Lab 01/19/13 1657 01/19/13 1743 01/19/13 2129 01/19/13 2349 01/20/13 0342 01/20/13 0805  GLUCAP 179* 178* 204* 307* 279* 195*   Hemoglobin A1C: No results found for this basename: HGBA1C,  in the last 168 hours Fasting Lipid Panel: No results found for this basename: CHOL, HDL, LDLCALC, TRIG, CHOLHDL, LDLDIRECT,  in the last 168 hours Thyroid Function Tests: No results found for this basename: TSH, T4TOTAL, FREET4, T3FREE, THYROIDAB,  in the last 168 hours Coagulation: No results found for this basename: LABPROT, INR,  in the last 168 hours Anemia Panel: No results found for this basename: VITAMINB12, FOLATE, FERRITIN, TIBC, IRON, RETICCTPCT,  in the last 168 hours Urine Drug Screen: Drugs of Abuse     Component Value Date/Time   LABOPIA PPS 06/27/2012 1645   LABOPIA NEG 07/02/2006 2021   COCAINSCRNUR NEG 06/27/2012 1645   LABBENZ PPS 06/27/2012 1645   LABBENZ NEG 07/02/2006 2021   AMPHETMU NEG 07/02/2006 2021   LABBARB NEG 06/27/2012 1645    Alcohol Level: No results found for this basename: ETH,  in the last 168 hours Urinalysis:  Recent Labs Lab 01/19/13 1012 01/20/13 0136  COLORURINE YELLOW  YELLOW  LABSPEC 1.040* 1.030  PHURINE 5.0 5.0  GLUCOSEU >1000* 100*  HGBUR MODERATE* LARGE*  BILIRUBINUR NEGATIVE SMALL*  KETONESUR NEGATIVE 15*  PROTEINUR NEGATIVE 100*  UROBILINOGEN 0.2 1.0  NITRITE NEGATIVE NEGATIVE  LEUKOCYTESUR MODERATE* LARGE*    Micro Results: Recent Results (from the past 240 hour(s))  URINE CULTURE     Status: None   Collection Time    01/19/13 10:12 AM      Result Value Range Status   Specimen Description URINE, CLEAN CATCH   Final   Special Requests NONE   Final   Culture  Setup Time     Final   Value: 01/19/2013 12:14     Performed at [facility] Count     Final   Value: >=100,000 COLONIES/ML     Performed at [facility]   Culture     Final   Value: Multiple bacterial morphotypes present, none predominant. Suggest appropriate recollection if clinically indicated.     Performed at [facility]   Report Status 01/20/2013 FINAL   Final  CULTURE, ROUTINE-ABSCESS     Status: None   Collection Time    01/19/13  6:29 PM      Result Value Range Status   Specimen Description ABSCESS PERIRECTAL   Final   Special Requests PATIENT ON FOLLOWING ZOSYN   Final   Gram Stain     Final   Value: MODERATE WBC PRESENT,BOTH PMN AND MONONUCLEAR     NO SQUAMOUS EPITHELIAL CELLS SEEN     MODERATE GRAM POSITIVE COCCI IN CLUSTERS     Performed at [facility]   Culture     Final   Value: Culture reincubated for better growth     Performed at [facility]   Report Status PENDING   Incomplete  ANAEROBIC CULTURE     Status: None   Collection Time    01/19/13  6:29 PM      Result Value Range Status   Specimen Description ABSCESS PERIRECTAL   Final   Special Requests PATIENT ON FOLLOWING ZOSYN   Final   Gram Stain     Final   Value: MODERATE WBC PRESENT,BOTH PMN AND MONONUCLEAR     NO SQUAMOUS EPITHELIAL CELLS SEEN     MODERATE GRAM POSITIVE COCCI IN CLUSTERS     Performed at [facility]   Culture  PENDING   Incomplete   Report Status PENDING   Incomplete   Studies/Results: Ct Pelvis W Contrast  01/19/2013   CLINICAL DATA:  Right buttock pain and swelling.  EXAM: CT PELVIS WITH CONTRAST  TECHNIQUE: Multidetector CT imaging of the pelvis was performed using the standard protocol following the bolus administration of intravenous contrast.  CONTRAST:  80mL OMNIPAQUE IOHEXOL 300 MG/ML  SOLN  COMPARISON:  FINDINGS:  In the right gluteal region posterior to the pain as an ill-defined a amorphous region of low attenuation is appreciated within the subcutaneous fat. This area measures 6.7 x 3.8 cm in AP by transverse dimensions. This area is not wall golf and there is inflammatory change and surrounding new mesenteric fat. Finding has the appearance of a phlegmon. Best appreciated on image 47 series 3.  The urinary bladder wall is diffusely thickened demonstrates areas of trabeculation. A bilobed filling defects identified within the posterior lateral aspect of the urinary bladder on the left image number 30 series 3 measuring 1.9 cm in longitudinal dimensions. There is no evidence of intrapelvic free fluid nor loculated fluid collections. Coarse atherosclerotic calcifications identified within the distal abdominal aorta. There is no evidence of bowel obstruction.  IMPRESSION: 1. A amorphous collection within the right gluteal region adjacent to the gluteal fold having the appearance of a phlegmon. 2. Findings raising diagnostic concern of chronic bladder outlet obstruction. There are also findings concerning for filling defect in the posterior aspect of the urinary bladder as described above further evaluation with direct visualization recommended   Electronically Signed   By: Salome Holmes M.D.   On: 01/19/2013 14:05   Medications: I have reviewed the patient's current medications. Scheduled Meds: . aspirin  81 mg Oral Daily  . carvedilol  25 mg Oral BID WC  . heparin  5,000 Units Subcutaneous Q8H  .  [START ON 01/21/2013] influenza vac split quadrivalent PF  0.5 mL Intramuscular Tomorrow-1000  . insulin aspart  0-9 Units Subcutaneous Q4H  . insulin glargine  10 Units Subcutaneous QHS  . lisinopril  40 mg Oral Daily  . loratadine  10 mg Oral Daily  . pantoprazole  20 mg Oral Daily  . piperacillin-tazobactam (ZOSYN)  IV  3.375 g Intravenous Q8H  . [START ON 01/21/2013] pneumococcal 23 valent vaccine  0.5 mL Intramuscular Tomorrow-1000  . simvastatin  20 mg Oral Daily  . triamcinolone ointment  1 application Topical BID  . vancomycin  1,250 mg Intravenous Q12H   Continuous Infusions:  PRN Meds:.albuterol, morphine injection, oxyCODONE Assessment/Plan: Principal Problem:   Abscess of buttock, right Active Problems:   DIABETES MELLITUS, TYPE II   HYPERLIPIDEMIA   HYPERTENSION   GERD   Meningioma   Chronic systolic congestive heart failure   UTI (urinary tract infection)  # Abscess-right buttock: CT showed amorphous collection within the right gluteal region adjacent to the gluteal fold having the appearance of a phlegmon. Surgery was consulted and I&D was performed. Patient tolerated the procedure well; wound now packed w/ gauze. Currently patient is hemodynamically stable and afebrile. Her pain is well controlled. Patient does not meet the criteria for sepsis. She was started on vancomycin and zosyn per surgery.  Wound culture and blood culture x 2 are pending.  Oxycodone 5 to 10 mg q4h PRN for pain.  Pt has not been able to keep anything down and has vomited at least twice today after breakfast and lunch.   - Continued IV fluids at 76ml/hr - Placed on clear liquid diet  -Will switch to po augmentin tomorrow per surgery's recommendation.   Hypokalemia: K 3.2 on admission. Likely due to lasix therapy. Pt is not ok KCl supplementation at home. Kdur x 1 dose.   -BMET   # Bladder outlet obstruction: Stable.  CT scan showed possible filling defect in the posterior aspect of the  urinary bladder and concern of chronic bladder outlet obstruction. Patient was recently treated for UTI with ciprofloxacin. Per patient's daughter, patient's dysuria resolved, but patient still has increased urinary frequency (1-2 weeks). Initial urine collection was not a clean catch. It is possible that patient has recurrent UTI. Patient is being treated w/ zosyn for abscess, which should also treat a UTI if present. Patient symptoms might be related to the findings on CT scan.  Pt has no suprapubic tenderness so probably not necessary to treat UTI.  Pt should have urology referral as outpatient workup for possible chronic bladder outlet obstruction   # DM2: Last Hgb A1C of 12.1% on 12/23/12. Patient was supposed to be on Lantus 10 units at bedtime daily and metformin 1000 mg twice a day at home. It seems that patient may not have been completely compliant to this regimen. She is currently living with her daughter, who is not very sure whether patient is doing insulin injection correctly.   -Continue lantus 8 units qhs and SSI.    # HTN: Stable.  BP is 131/64 mmHg on admission. She is on lisinopril 40mg  daily and Coreg 25 mg at home.  -Continue home meds  # History of RLL adenocarcinoma: She is s/p right lower lobectomy 12/15/10. Her recent chest CT on 05/07/12 showed small left pleural effusion and no evidence of pleural effusion on the right. Patient was followed up by oncology, Dr. Arbutus Ped in the past. Patient has Not followed up with oncologist since 07/2011. Currently patient does not have cough, shortness of breath or chest pain. She is using albuterol inhaler prn at home.  -Continue Albuterol inhaler PRN   # Chronic sCHF: Stable. Echo on 06/2010 with EF of 15-20% and Myoview showing thinning of apex, however, myoview on 12/12/10 with improved EF of 40% with nonischemic CM. Patient is on Lasix 20 mg daily, aspirin, Coreg 25 mg twice a day, lisinopril 40 mg daily at home. Weight was 205lbs on  12/23/12-->207lbs today. Clinically patient is euvolemic without JVD and the leg edema and lungs CTAB, therefore no concern for acute exacerbation at this time.  Continue to hold lasix given patient is at risk of developing sepsis.  IVF at 65ml/hr.  Continue ACEi, BB, ASA, and statin.  Daily weights.   # Meningioma: Patient's MRI on 10/2010 showed stable right frontal lobe meningioma compared with MRI on MRI 07/08/2010. Patient does not have an focal neurologic signs.  Pt should follow up as outpt.  #EKG changes: Stable.  EKG w/ ?J point elevation V2, V3, T wave inversion aVL. Patient w/o chest pain or SOB at this time. Given patient's hx of CHF and DM, we wanted to evaluated further w/ one troponin which was negative.    #Unspecified rash: Pt has scattered hyperpigmented plaques w/ overlying adherent scale to bilateral lower extremities.  Possible psoriasis? -punch biopsy today    Dispo: Disposition is deferred at this time, awaiting improvement of current medical problems.  Anticipated discharge in approximately 1-2 day(s).   The patient does have a current PCP Darden Palmer, MD) and does need an [facility] hospital follow-up appointment after discharge.    .Services Needed at time of discharge: Y = Yes, Blank = No PT:   OT:   RN:   Equipment:   Other:     LOS: 1 day   Boykin Peek, MD 01/20/2013, 10:54 AM

## 2013-01-20 NOTE — H&P (Signed)
  Date: 01/20/2013  Patient name: [name]  Medical record number: [id number]  Date of birth: Apr 18, 1933   I have seen and evaluated Nada Libman and discussed their care with the Residency Team.   Assessment and Plan: I have seen and evaluated the patient as outlined above. I agree with the formulated Assessment and Plan as detailed in the residents' admission note, with the following changes:   1. Abscess R buttock - surgery took pt to OR for I&D. Team examined wound today and there is min erythema, some induration but no fluctuance, minimal pain. She is on Vanc and Zosyn - will need to discuss with surgery narrowing ABX.  2. Skin changes - pt didn't respond to topical steroids. Will do punch bx today.   3. Bladder filling defect - she will need outpt urology F/U.   4. L breast mass - seen on CT 05/07/12 and describe as a 4 cm subareolar low attenuation breast mass and to eval with [facility]. At that admission, sh had I&D of a L inner abscess. She will nede outpt [facility].    [name], MD 12/5/20143:21 PM

## 2013-01-21 LAB — GLUCOSE, CAPILLARY
Glucose-Capillary: 131 mg/dL — ABNORMAL HIGH (ref 70–99)
Glucose-Capillary: 145 mg/dL — ABNORMAL HIGH (ref 70–99)
Glucose-Capillary: 146 mg/dL — ABNORMAL HIGH (ref 70–99)
Glucose-Capillary: 179 mg/dL — ABNORMAL HIGH (ref 70–99)
Glucose-Capillary: 187 mg/dL — ABNORMAL HIGH (ref 70–99)
Glucose-Capillary: 204 mg/dL — ABNORMAL HIGH (ref 70–99)

## 2013-01-21 LAB — BASIC METABOLIC PANEL
CO2: 28 mEq/L (ref 19–32)
Calcium: 7.9 mg/dL — ABNORMAL LOW (ref 8.4–10.5)
Chloride: 109 mEq/L (ref 96–112)
Creatinine, Ser: 1.22 mg/dL — ABNORMAL HIGH (ref 0.50–1.10)
GFR calc Af Amer: 48 mL/min — ABNORMAL LOW (ref >90)
Glucose, Bld: 154 mg/dL — ABNORMAL HIGH (ref 70–99)
Sodium: 145 mEq/L (ref 135–145)

## 2013-01-21 LAB — URINE CULTURE: Colony Count: 4000

## 2013-01-21 LAB — PHOSPHORUS: Phosphorus: 3.3 mg/dL (ref 2.3–4.6)

## 2013-01-21 MED ORDER — SODIUM CHLORIDE 0.9 % IV SOLN
INTRAVENOUS | Status: DC
  Administered 2013-01-22 – 2013-01-23 (×2): via INTRAVENOUS
  Administered 2013-01-24: 75 mL/h via INTRAVENOUS
  Administered 2013-01-25: 08:00:00 via INTRAVENOUS

## 2013-01-21 MED ORDER — POTASSIUM CHLORIDE CRYS ER 20 MEQ PO TBCR
40.0000 meq | EXTENDED_RELEASE_TABLET | Freq: Once | ORAL | Status: AC
  Administered 2013-01-21: 40 meq via ORAL
  Filled 2013-01-21: qty 2

## 2013-01-21 MED ORDER — SULFAMETHOXAZOLE-TMP DS 800-160 MG PO TABS
1.0000 | ORAL_TABLET | Freq: Two times a day (BID) | ORAL | Status: DC
  Administered 2013-01-21 – 2013-01-22 (×2): 1 via ORAL
  Filled 2013-01-21 (×3): qty 1

## 2013-01-21 MED ORDER — INSULIN ASPART 100 UNIT/ML ~~LOC~~ SOLN
0.0000 [IU] | Freq: Three times a day (TID) | SUBCUTANEOUS | Status: DC
  Administered 2013-01-22: 2 [IU] via SUBCUTANEOUS
  Administered 2013-01-22 (×2): 1 [IU] via SUBCUTANEOUS
  Administered 2013-01-22: 2 [IU] via SUBCUTANEOUS
  Administered 2013-01-23 (×2): 1 [IU] via SUBCUTANEOUS
  Administered 2013-01-23: 2 [IU] via SUBCUTANEOUS
  Administered 2013-01-23 – 2013-01-26 (×10): 1 [IU] via SUBCUTANEOUS
  Administered 2013-01-27: 2 [IU] via SUBCUTANEOUS

## 2013-01-21 MED ORDER — VANCOMYCIN HCL IN DEXTROSE 750-5 MG/150ML-% IV SOLN
750.0000 mg | Freq: Two times a day (BID) | INTRAVENOUS | Status: DC
  Administered 2013-01-21: 750 mg via INTRAVENOUS
  Filled 2013-01-21: qty 150

## 2013-01-21 MED ORDER — SULFAMETHOXAZOLE-TMP DS 800-160 MG PO TABS: 2.0000 | ORAL_TABLET | Freq: Two times a day (BID) | ORAL | Status: DC

## 2013-01-21 MED ORDER — POTASSIUM CHLORIDE CRYS ER 20 MEQ PO TBCR
20.0000 meq | EXTENDED_RELEASE_TABLET | Freq: Once | ORAL | Status: AC
  Administered 2013-01-21: 20 meq via ORAL
  Filled 2013-01-21: qty 1

## 2013-01-21 NOTE — Progress Notes (Signed)
Subjective:  Pt is sitting up drinking some juice but hasn't eaten her breakfast.  Her daughter and other family members are present in the room.  Pt's daughter indicated that she had not been told previously about her mother's meningioma and was concerned about this.  I told her this was a benign tumor and not malignant.  She has not been up walking since admission and typically uses a cane to ambulate at home.  She states she ate mashed potatoes and carrots for dinner last PM and experienced no N/V.    Objective: Vital signs in last 24 hours: Filed Vitals:   01/20/13 1806 01/20/13 2154 01/21/13 0448 01/21/13 0521  BP: 141/53 127/50  149/62  Pulse: 78 76  73  Temp: 98.1 F (36.7 C) 99.2 F (37.3 C)  98.8 F (37.1 C)  TempSrc: Oral Oral  Oral  Resp: 18 20  20   Height:      Weight:   91.218 kg (201 lb 1.6 oz)   SpO2: 95% 96%  100%   Weight change: -1.95 kg (-4 lb 4.8 oz)  Intake/Output Summary (Last 24 hours) at 01/21/13 1047 Last data filed at 01/21/13 0342  Gross per 24 hour  Intake      0 ml  Output    350 ml  Net   -350 ml   Physical Exam:   Filed Vitals:   01/21/13 0521  BP: 149/62  Pulse: 73  Temp: 98.8 F (37.1 C)  Resp: 20   General: Elderly female in NAD.  She is lying in bed with her breakfast tray at the bedside which has not been consumed except for some juice.    HEENT:vision grossly intact, oropharynx clear and non-erythematous Neck: supple Lungs: CTAB, normal work of respiration, no wheezes, rales, ronchi Heart: regular rate and rhythm, no murmurs, gallops, or rubs, DP 2+ b/l Abdomen: soft, non-tender, non-distended, normal bowel sounds; no suprapubic tenderness  Extremities: warm extremities b/l, no BLE edema  Skin: right thigh s/p punch biospy without erythema, swelling, warmth: open surgical incision approx 3-4cm in length packed w/ gauze to base of R buttock; gauze has minimal fluid, some TTP; there are also scattered hyperpigmented plaques w/  small amount of overlying adherent scale to bilateral lower extremities  Neurologic: alert & oriented X3, cranial nerves II-XII grossly intact, strength grossly intact, sensation intact to light touch   Lab Results: Basic Metabolic Panel:  Recent Labs Lab 01/20/13 0532 01/21/13 0420  NA 142 145  K 3.2* 3.2*  CL 105 109  CO2 26 28  GLUCOSE 271* 154*  BUN 17 18  CREATININE 1.01 1.22*  CALCIUM 8.5 7.9*   Liver Function Tests:  Recent Labs Lab 01/19/13 0925  AST 10  ALT 8  ALKPHOS 85  BILITOT 0.3  PROT 7.3  ALBUMIN 3.2*   No results found for this basename: LIPASE, AMYLASE,  in the last 168 hours No results found for this basename: AMMONIA,  in the last 168 hours CBC:  Recent Labs Lab 01/19/13 0925 01/20/13 0532  WBC 13.4* 15.0*  NEUTROABS 10.1*  --   HGB 11.7* 10.6*  HCT 36.0 34.7*  MCV 78.4 80.3  PLT 306 292   Cardiac Enzymes:  Recent Labs Lab 01/19/13 2225  TROPONINI <0.30   BNP: No results found for this basename: PROBNP,  in the last 168 hours D-Dimer: No results found for this basename: DDIMER,  in the last 168 hours CBG:  Recent Labs Lab 01/20/13 0805 01/20/13  1209 01/20/13 1639 01/20/13 1949 01/21/13 0007 01/21/13 0417  GLUCAP 195* 196* 184* 183* 131* 145*   Hemoglobin A1C: No results found for this basename: HGBA1C,  in the last 168 hours Fasting Lipid Panel: No results found for this basename: CHOL, HDL, LDLCALC, TRIG, CHOLHDL, LDLDIRECT,  in the last 168 hours Thyroid Function Tests: No results found for this basename: TSH, T4TOTAL, FREET4, T3FREE, THYROIDAB,  in the last 168 hours Coagulation: No results found for this basename: LABPROT, INR,  in the last 168 hours Anemia Panel: No results found for this basename: VITAMINB12, FOLATE, FERRITIN, TIBC, IRON, RETICCTPCT,  in the last 168 hours Urine Drug Screen: Drugs of Abuse     Component Value Date/Time   LABOPIA PPS 06/27/2012 1645   LABOPIA NEG 07/02/2006 2021   COCAINSCRNUR  NEG 06/27/2012 1645   LABBENZ PPS 06/27/2012 1645   LABBENZ NEG 07/02/2006 2021   AMPHETMU NEG 07/02/2006 2021   LABBARB NEG 06/27/2012 1645    Alcohol Level: No results found for this basename: ETH,  in the last 168 hours Urinalysis:  Recent Labs Lab 01/19/13 1012 01/20/13 0136  COLORURINE YELLOW YELLOW  LABSPEC 1.040* 1.030  PHURINE 5.0 5.0  GLUCOSEU >1000* 100*  HGBUR MODERATE* LARGE*  BILIRUBINUR NEGATIVE SMALL*  KETONESUR NEGATIVE 15*  PROTEINUR NEGATIVE 100*  UROBILINOGEN 0.2 1.0  NITRITE NEGATIVE NEGATIVE  LEUKOCYTESUR MODERATE* LARGE*    Micro Results: Recent Results (from the past 240 hour(s))  URINE CULTURE     Status: None   Collection Time    01/19/13 10:12 AM      Result Value Range Status   Specimen Description URINE, CLEAN CATCH   Final   Special Requests NONE   Final   Culture  Setup Time     Final   Value: 01/19/2013 12:14     Performed at [facility] Count     Final   Value: >=100,000 COLONIES/ML     Performed at [facility]   Culture     Final   Value: Multiple bacterial morphotypes present, none predominant. Suggest appropriate recollection if clinically indicated.     Performed at [facility]   Report Status 01/20/2013 FINAL   Final  CULTURE, ROUTINE-ABSCESS     Status: None   Collection Time    01/19/13  6:29 PM      Result Value Range Status   Specimen Description ABSCESS PERIRECTAL   Final   Special Requests PATIENT ON FOLLOWING ZOSYN   Final   Gram Stain     Final   Value: MODERATE WBC PRESENT,BOTH PMN AND MONONUCLEAR     NO SQUAMOUS EPITHELIAL CELLS SEEN     MODERATE GRAM POSITIVE COCCI IN CLUSTERS     Performed at [facility]   Culture     Final   Value: MODERATE STAPHYLOCOCCUS AUREUS     Note: RIFAMPIN AND GENTAMICIN SHOULD NOT BE USED AS SINGLE DRUGS FOR TREATMENT OF STAPH INFECTIONS.     Performed at [facility]   Report Status PENDING   Incomplete  ANAEROBIC CULTURE     Status:  None   Collection Time    01/19/13  6:29 PM      Result Value Range Status   Specimen Description ABSCESS PERIRECTAL   Final   Special Requests PATIENT ON FOLLOWING ZOSYN   Final   Gram Stain     Final   Value: MODERATE WBC PRESENT,BOTH PMN AND MONONUCLEAR  NO SQUAMOUS EPITHELIAL CELLS SEEN     MODERATE GRAM POSITIVE COCCI IN CLUSTERS     Performed at [facility]   Culture     Final   Value: NO ANAEROBES ISOLATED; CULTURE IN PROGRESS FOR 5 DAYS     Performed at [facility]   Report Status PENDING   Incomplete  CULTURE, BLOOD (ROUTINE X 2)     Status: None   Collection Time    01/19/13  9:00 PM      Result Value Range Status   Specimen Description BLOOD LEFT HAND   Final   Special Requests BOTTLES DRAWN AEROBIC ONLY 3CC   Final   Culture  Setup Time     Final   Value: 01/20/2013 01:46     Performed at [facility]   Culture     Final   Value:        BLOOD CULTURE RECEIVED NO GROWTH TO DATE CULTURE WILL BE HELD FOR 5 DAYS BEFORE ISSUING A FINAL NEGATIVE REPORT     Performed at [facility]   Report Status PENDING   Incomplete  CULTURE, BLOOD (ROUTINE X 2)     Status: None   Collection Time    01/19/13  9:10 PM      Result Value Range Status   Specimen Description BLOOD RIGHT HAND   Final   Special Requests BOTTLES DRAWN AEROBIC ONLY 3CC   Final   Culture  Setup Time     Final   Value: 01/20/2013 01:46     Performed at [facility]   Culture     Final   Value:        BLOOD CULTURE RECEIVED NO GROWTH TO DATE CULTURE WILL BE HELD FOR 5 DAYS BEFORE ISSUING A FINAL NEGATIVE REPORT     Performed at [facility]   Report Status PENDING   Incomplete   Studies/Results: Ct Pelvis W Contrast  01/19/2013   CLINICAL DATA:  Right buttock pain and swelling.  EXAM: CT PELVIS WITH CONTRAST  TECHNIQUE: Multidetector CT imaging of the pelvis was performed using the standard protocol following the bolus administration of intravenous contrast.   CONTRAST:  80mL OMNIPAQUE IOHEXOL 300 MG/ML  SOLN  COMPARISON:  FINDINGS:  In the right gluteal region posterior to the pain as an ill-defined a amorphous region of low attenuation is appreciated within the subcutaneous fat. This area measures 6.7 x 3.8 cm in AP by transverse dimensions. This area is not wall golf and there is inflammatory change and surrounding new mesenteric fat. Finding has the appearance of a phlegmon. Best appreciated on image 47 series 3.  The urinary bladder wall is diffusely thickened demonstrates areas of trabeculation. A bilobed filling defects identified within the posterior lateral aspect of the urinary bladder on the left image number 30 series 3 measuring 1.9 cm in longitudinal dimensions. There is no evidence of intrapelvic free fluid nor loculated fluid collections. Coarse atherosclerotic calcifications identified within the distal abdominal aorta. There is no evidence of bowel obstruction.  IMPRESSION: 1. A amorphous collection within the right gluteal region adjacent to the gluteal fold having the appearance of a phlegmon. 2. Findings raising diagnostic concern of chronic bladder outlet obstruction. There are also findings concerning for filling defect in the posterior aspect of the urinary bladder as described above further evaluation with direct visualization recommended   Electronically Signed   By: Salome Holmes M.D.   On: 01/19/2013 14:05  Medications: I have reviewed the patient's current medications. Scheduled Meds: . aspirin  81 mg Oral Daily  . carvedilol  25 mg Oral BID WC  . heparin  5,000 Units Subcutaneous Q8H  . influenza vac split quadrivalent PF  0.5 mL Intramuscular Tomorrow-1000  . insulin aspart  0-9 Units Subcutaneous Q4H  . insulin glargine  5 Units Subcutaneous QHS  . lisinopril  40 mg Oral Daily  . loratadine  10 mg Oral Daily  . pantoprazole  20 mg Oral Daily  . piperacillin-tazobactam (ZOSYN)  IV  3.375 g Intravenous Q8H  . pneumococcal 23  valent vaccine  0.5 mL Intramuscular Tomorrow-1000  . potassium chloride  40 mEq Oral Once  . simvastatin  20 mg Oral Daily  . triamcinolone ointment  1 application Topical BID  . vancomycin  750 mg Intravenous Q12H   Continuous Infusions:  PRN Meds:.albuterol, morphine injection, ondansetron (ZOFRAN) IV, oxyCODONE Assessment/Plan: Principal Problem:   Abscess of buttock, right Active Problems:   DIABETES MELLITUS, TYPE II   HYPERLIPIDEMIA   HYPERTENSION   GERD   Meningioma   Chronic systolic congestive heart failure   UTI (urinary tract infection)  # Abscess-right buttock: Improved.  CT showed amorphous collection within the right gluteal region adjacent to the gluteal fold.  Surgery was consulted and I&D was performed. Patient tolerated the procedure well; wound now packed w/ gauze. Currently patient is hemodynamically stable and afebrile. Her pain is well controlled. Patient does not meet the criteria for sepsis. She was started on vancomycin and zosyn per surgery.  Wound culture reveals staph aureus; sensitivities are pending;  blood culture x 2 show no growth to date.  Oxycodone 5 to 10 mg q4h PRN for pain.  Pt had difficulty keeping anything down and vomited at least twice today after breakfast and lunch yesterday.  She was started on a clear liquid diet yesterday evening.  She was able to eat mashed potatoes and carrots yesterday evening without having any nausea or vomiting.  This morning she had drank some juice and we interrupted her breakfast.  She thinks she may be able to eat some solid food for lunch so we will try her on this.  Her wound looks good with minimal serosanguineous drainage. - Will switch to po bactrim today per surgery's recommendation.   #AKI: Pt creatinine increased from 0.72 to 1.22 within 48 hours.  Etiology could be from volume depletion since pt had several episodes of emesis yesterday and/or nephrotoxicity from vancomycin/zosyn.  Lab Results  Component  Value Date   CREATININE 1.22* 01/21/2013   -IVF @75ml /hr -recheck BMET in AM -d/c vanco/zosyn and start bactrim ds   Hypokalemia: K 3.2 on admission. Likely due to lasix therapy. Pt is not on KCl supplementation at home. Kdur x 1 dose.  BMET this AM revealed a potassium of 3.2.  Lab Results  Component Value Date   K 3.2* 01/21/2013   -check BMET in AM - K given once - will be given in the evening   # Bladder outlet obstruction: Stable.  CT scan showed possible filling defect in the posterior aspect of the urinary bladder and concern of chronic bladder outlet obstruction. Patient was recently treated for UTI with ciprofloxacin. Per patient's daughter, patient's dysuria resolved, but patient still has increased urinary frequency (1-2 weeks). Initial urine collection was not a clean catch. It is possible that patient has recurrent UTI. Patient is being treated w/ zosyn for abscess, which should also treat a UTI  if present. Patient symptoms might be related to the findings on CT scan.  Pt has no suprapubic tenderness so probably not necessary to treat UTI.  Pt should have urology referral as outpatient workup for possible chronic bladder outlet obstruction  -bladder scan this AM revealed PVR of 54cc  # DM2: Last Hgb A1C of 12.1% on 12/23/12. Patient was supposed to be on Lantus 10 units at bedtime daily and metformin 1000 mg twice a day at home. It seems that patient may not have been completely compliant to this regimen. She is currently living with her daughter, who is not very sure whether patient is doing insulin injection correctly.   -Continue lantus 8 units qhs and SSI  CBG (last 3)   Recent Labs  01/20/13 1949 01/21/13 0007 01/21/13 0417  GLUCAP 183* 131* 145*    # HTN: Stable.  BP is 131/64 mmHg on admission. She is on lisinopril 40mg  daily and Coreg 25 mg at home.  -Continue home meds  # History of RLL adenocarcinoma: She is s/p right lower lobectomy 12/15/10.  Her recent chest CT on 05/07/12 showed small left pleural effusion and no evidence of pleural effusion on the right. Patient was followed up by oncology, Dr. Arbutus Ped in the past. Patient has Not followed up with oncologist since 07/2011. Currently patient does not have cough, shortness of breath or chest pain. She is using albuterol inhaler prn at home.  -Continue Albuterol inhaler PRN   # Chronic sCHF: Stable. Echo on 06/2010 with EF of 15-20% and Myoview showing thinning of apex, however, myoview on 12/12/10 with improved EF of 40% with nonischemic CM. Patient is on Lasix 20 mg daily, aspirin, Coreg 25 mg twice a day, lisinopril 40 mg daily at home. Weight was 205lbs on 12/23/12-->207lbs today. Clinically patient is euvolemic without JVD and the leg edema and lungs CTAB, therefore no concern for acute exacerbation at this time.  Continue to hold lasix given patient is at risk of developing sepsis.  IVF at 17ml/hr.  Continue ACEi, BB, ASA, and statin.  Daily weights.   # Meningioma: Patient's MRI on 10/2010 showed stable right frontal lobe meningioma compared with MRI on MRI 07/08/2010. Patient does not have an focal neurologic signs.  Pt should follow up as outpt. -discussed with pt and daughter the results of her MRI  #EKG changes: Stable.  EKG w/ ?J point elevation V2, V3, T wave inversion aVL. Patient w/o chest pain or SOB at this time. Given patient's hx of CHF and DM, we wanted to evaluated further w/ one troponin which was negative.    #Unspecified rash: Pt has scattered hyperpigmented plaques w/ overlying adherent scale to bilateral lower extremities. -biopsy site without erythema, swelling, or warmth -punch biopsy pathology results pending; available Monday   Dispo: Disposition is deferred at this time, awaiting improvement of current medical problems.  Anticipated discharge in approximately 1-2 day(s).   The patient does have a current PCP Darden Palmer, MD) and does need an [facility] hospital  follow-up appointment after discharge.    .Services Needed at time of discharge: Y = Yes, Blank = No PT:   OT:   RN:   Equipment:   Other:     LOS: 2 days   Boykin Peek, MD 01/21/2013, 10:47 AM

## 2013-01-21 NOTE — Progress Notes (Signed)
Patient voided moderate amount in bedside commode. Bladder scan completed 15 minutes after void. 54cc remaining in bladder. Gildardo Cranker, RN

## 2013-01-21 NOTE — Progress Notes (Signed)
2 Days Post-Op  Subjective: Feels better  Objective: Vital signs in last 24 hours: Temp:  [97.8 F (36.6 C)-99.2 F (37.3 C)] 98.8 F (37.1 C) (12/06 0521) Pulse Rate:  [73-85] 73 (12/06 0521) Resp:  [18-20] 20 (12/06 0521) BP: (109-149)/(50-63) 149/62 mmHg (12/06 0521) SpO2:  [94 %-100 %] 100 % (12/06 0521) Weight:  [201 lb 1.6 oz (91.218 kg)] 201 lb 1.6 oz (91.218 kg) (12/06 0448) Last BM Date: 01/18/13  Intake/Output from previous day: 12/05 0701 - 12/06 0700 In: -  Out: 350 [Urine:350] Intake/Output this shift:    Skin: wound looks fine.  no evidence of undrained infection, dressing changed and wound packed  Lab Results:   Recent Labs  01/19/13 0925 01/20/13 0532  WBC 13.4* 15.0*  HGB 11.7* 10.6*  HCT 36.0 34.7*  PLT 306 292   BMET  Recent Labs  01/20/13 0532 01/21/13 0420  NA 142 145  K 3.2* 3.2*  CL 105 109  CO2 26 28  GLUCOSE 271* 154*  BUN 17 18  CREATININE 1.01 1.22*  CALCIUM 8.5 7.9*   PT/INR No results found for this basename: LABPROT, INR,  in the last 72 hours ABG  Recent Labs  01/19/13 0943 01/19/13 1102  PHART  --  7.460*  HCO3 32.8* 28.4*    Studies/Results: Ct Pelvis W Contrast  01/19/2013   CLINICAL DATA:  Right buttock pain and swelling.  EXAM: CT PELVIS WITH CONTRAST  TECHNIQUE: Multidetector CT imaging of the pelvis was performed using the standard protocol following the bolus administration of intravenous contrast.  CONTRAST:  80mL OMNIPAQUE IOHEXOL 300 MG/ML  SOLN  COMPARISON:  FINDINGS:  In the right gluteal region posterior to the pain as an ill-defined a amorphous region of low attenuation is appreciated within the subcutaneous fat. This area measures 6.7 x 3.8 cm in AP by transverse dimensions. This area is not wall golf and there is inflammatory change and surrounding new mesenteric fat. Finding has the appearance of a phlegmon. Best appreciated on image 47 series 3.  The urinary bladder wall is diffusely thickened  demonstrates areas of trabeculation. A bilobed filling defects identified within the posterior lateral aspect of the urinary bladder on the left image number 30 series 3 measuring 1.9 cm in longitudinal dimensions. There is no evidence of intrapelvic free fluid nor loculated fluid collections. Coarse atherosclerotic calcifications identified within the distal abdominal aorta. There is no evidence of bowel obstruction.  IMPRESSION: 1. A amorphous collection within the right gluteal region adjacent to the gluteal fold having the appearance of a phlegmon. 2. Findings raising diagnostic concern of chronic bladder outlet obstruction. There are also findings concerning for filling defect in the posterior aspect of the urinary bladder as described above further evaluation with direct visualization recommended   Electronically Signed   By: Salome Holmes M.D.   On: 01/19/2013 14:05    Anti-infectives: Anti-infectives   Start     Dose/Rate Route Frequency Ordered Stop   01/21/13 1200  vancomycin (VANCOCIN) IVPB 750 mg/150 ml premix     750 mg 150 mL/hr over 60 Minutes Intravenous Every 12 hours 01/21/13 0732     01/20/13 0000  vancomycin (VANCOCIN) 1,250 mg in sodium chloride 0.9 % 250 mL IVPB  Status:  Discontinued     1,250 mg 166.7 mL/hr over 90 Minutes Intravenous Every 12 hours 01/19/13 2308 01/21/13 0732   01/19/13 1200  piperacillin-tazobactam (ZOSYN) IVPB 3.375 g     3.375 g 12.5 mL/hr over 240  Minutes Intravenous Every 8 hours 01/19/13 1110     01/19/13 1200  vancomycin (VANCOCIN) IVPB 1000 mg/200 mL premix  Status:  Discontinued     1,000 mg 200 mL/hr over 60 Minutes Intravenous Every 12 hours 01/19/13 1110 01/19/13 1459      Assessment/Plan: s/p Procedure(s): IRRIGATION AND DEBRIDEMENT PERIRECTAL ABSCESS (N/A) continue wet to dry wound care, woud packing BID and at least once per day as outpatient.  dispo planning  LOS: 2 days    Lodema Pilot DAVID 01/21/2013

## 2013-01-21 NOTE — Progress Notes (Signed)
79yo female was brought to OR for I&D of abscess.  Vancomycin 1250mg  IV Q12H started for wt 94kg and CrCl ~65 ml/min.  SrCr has increased today to 1.22, est CrCl ~40 ml/hr. Will adjust dose of vanc to 750 mg IV q12 hours.  F/u renal function, cultures and clinical course.  Talbert Cage, PharmD 01/21/2013 7:32 AM

## 2013-01-22 LAB — BASIC METABOLIC PANEL
CO2: 25 mEq/L (ref 19–32)
Calcium: 7.9 mg/dL — ABNORMAL LOW (ref 8.4–10.5)
Chloride: 107 mEq/L (ref 96–112)
Creatinine, Ser: 1.78 mg/dL — ABNORMAL HIGH (ref 0.50–1.10)
GFR calc non Af Amer: 26 mL/min — ABNORMAL LOW (ref >90)
Glucose, Bld: 152 mg/dL — ABNORMAL HIGH (ref 70–99)
Potassium: 4 mEq/L (ref 3.5–5.1)
Sodium: 142 mEq/L (ref 135–145)

## 2013-01-22 LAB — GLUCOSE, CAPILLARY: Glucose-Capillary: 164 mg/dL — ABNORMAL HIGH (ref 70–99)

## 2013-01-22 MED ORDER — DOXYCYCLINE HYCLATE 100 MG PO TABS
100.0000 mg | ORAL_TABLET | Freq: Two times a day (BID) | ORAL | Status: DC
  Administered 2013-01-22 – 2013-01-26 (×8): 100 mg via ORAL
  Filled 2013-01-22 (×9): qty 1

## 2013-01-22 NOTE — Progress Notes (Signed)
Subjective:  Pt is sitting up in the chair this AM and PT is in the room finishing up their assessment.  Pt is in good spirits and denies any pain.  She is eating well and had no more episodes of N/V.  She would like to go home.     Objective: Vital signs in last 24 hours: Filed Vitals:   01/21/13 1445 01/21/13 2142 01/22/13 0516 01/22/13 0521  BP: 118/62 125/76 146/73   Pulse: 68 67 72   Temp: 98.5 F (36.9 C) 98.1 F (36.7 C) 98.2 F (36.8 C)   TempSrc: Oral Oral Oral   Resp: 20 20 20    Height:      Weight:    92.307 kg (203 lb 8 oz)  SpO2: 97% 100% 100%    Weight change: 0.318 kg (11.2 oz)  Intake/Output Summary (Last 24 hours) at 01/22/13 1207 Last data filed at 01/21/13 1900  Gross per 24 hour  Intake    845 ml  Output      0 ml  Net    845 ml   Physical Exam:   Filed Vitals:   01/22/13 0516  BP: 146/73  Pulse: 72  Temp: 98.2 F (36.8 C)  Resp: 20   General: Elderly female in NAD.  She is sitting up in her chair.      HEENT: EMOI, no scleral icterus  Neck: supple Lungs: CTAB, normal work of respiration, no wheezes, rales, ronchi Heart: regular rate and rhythm, no murmurs, gallops, or rubs, DP 2+ b/l Abdomen: soft, non-tender, non-distended, normal bowel sounds; no suprapubic tenderness  Extremities: warm extremities b/l, no BLE edema  Skin: right thigh s/p punch biospy without erythema, swelling, warmth: open surgical incision approx 3-4cm in length packed w/ gauze to base of R buttock; gauze has minimal fluid; there are also scattered hyperpigmented plaques w/ small amount of overlying adherent scale to bilateral lower extremities  Neurologic: alert & oriented X3, cranial nerves II-XII grossly intact, strength grossly intact   Lab Results: Basic Metabolic Panel:  Recent Labs Lab 01/21/13 0420 01/22/13 0435  NA 145 142  K 3.2* 4.0  CL 109 107  CO2 28 25  GLUCOSE 154* 152*  BUN 18 19  CREATININE 1.22* 1.78*  CALCIUM 7.9* 7.9*  MG 1.9  --     PHOS 3.3  --    Liver Function Tests:  Recent Labs Lab 01/19/13 0925  AST 10  ALT 8  ALKPHOS 85  BILITOT 0.3  PROT 7.3  ALBUMIN 3.2*   No results found for this basename: LIPASE, AMYLASE,  in the last 168 hours No results found for this basename: AMMONIA,  in the last 168 hours CBC:  Recent Labs Lab 01/19/13 0925 01/20/13 0532  WBC 13.4* 15.0*  NEUTROABS 10.1*  --   HGB 11.7* 10.6*  HCT 36.0 34.7*  MCV 78.4 80.3  PLT 306 292   Cardiac Enzymes:  Recent Labs Lab 01/19/13 2225  TROPONINI <0.30   BNP: No results found for this basename: PROBNP,  in the last 168 hours D-Dimer: No results found for this basename: DDIMER,  in the last 168 hours CBG:  Recent Labs Lab 01/21/13 0808 01/21/13 1206 01/21/13 1645 01/21/13 2001 01/21/13 2225 01/22/13 0759  GLUCAP 146* 187* 124* 204* 179* 133*   Drugs of Abuse     Component Value Date/Time   LABOPIA PPS 06/27/2012 1645   LABOPIA NEG 07/02/2006 2021   COCAINSCRNUR NEG 06/27/2012 1645   LABBENZ PPS  06/27/2012 1645   LABBENZ NEG 07/02/2006 2021   AMPHETMU NEG 07/02/2006 2021   LABBARB NEG 06/27/2012 1645    Urinalysis:  Recent Labs Lab 01/19/13 1012 01/20/13 0136  COLORURINE YELLOW YELLOW  LABSPEC 1.040* 1.030  PHURINE 5.0 5.0  GLUCOSEU >1000* 100*  HGBUR MODERATE* LARGE*  BILIRUBINUR NEGATIVE SMALL*  KETONESUR NEGATIVE 15*  PROTEINUR NEGATIVE 100*  UROBILINOGEN 0.2 1.0  NITRITE NEGATIVE NEGATIVE  LEUKOCYTESUR MODERATE* LARGE*    Micro Results: Recent Results (from the past 240 hour(s))  URINE CULTURE     Status: None   Collection Time    01/19/13 10:12 AM      Result Value Range Status   Specimen Description URINE, CLEAN CATCH   Final   Special Requests NONE   Final   Culture  Setup Time     Final   Value: 01/19/2013 12:14     Performed at [facility] Count     Final   Value: >=100,000 COLONIES/ML     Performed at [facility]   Culture     Final   Value: Multiple  bacterial morphotypes present, none predominant. Suggest appropriate recollection if clinically indicated.     Performed at [facility]   Report Status 01/20/2013 FINAL   Final  CULTURE, ROUTINE-ABSCESS     Status: None   Collection Time    01/19/13  6:29 PM      Result Value Range Status   Specimen Description ABSCESS PERIRECTAL   Final   Special Requests PATIENT ON FOLLOWING ZOSYN   Final   Gram Stain     Final   Value: MODERATE WBC PRESENT,BOTH PMN AND MONONUCLEAR     NO SQUAMOUS EPITHELIAL CELLS SEEN     MODERATE GRAM POSITIVE COCCI IN CLUSTERS     Performed at [facility]   Culture     Final   Value: MODERATE METHICILLIN RESISTANT STAPHYLOCOCCUS AUREUS     Note: RIFAMPIN AND GENTAMICIN SHOULD NOT BE USED AS SINGLE DRUGS FOR TREATMENT OF STAPH INFECTIONS. This organism DOES NOT demonstrate inducible Clindamycin resistance in vitro. CRITICAL RESULT CALLED TO, READ BACK BY AND VERIFIED WITH: BONNIE GIBBS@[id number]       ON [id number] BY [facility]     Performed at [facility]   Report Status 01/22/2013 FINAL   Final   Organism ID, Bacteria METHICILLIN RESISTANT STAPHYLOCOCCUS AUREUS   Final  ANAEROBIC CULTURE     Status: None   Collection Time    01/19/13  6:29 PM      Result Value Range Status   Specimen Description ABSCESS PERIRECTAL   Final   Special Requests PATIENT ON FOLLOWING ZOSYN   Final   Gram Stain     Final   Value: MODERATE WBC PRESENT,BOTH PMN AND MONONUCLEAR     NO SQUAMOUS EPITHELIAL CELLS SEEN     MODERATE GRAM POSITIVE COCCI IN CLUSTERS     Performed at [facility]   Culture     Final   Value: NO ANAEROBES ISOLATED; CULTURE IN PROGRESS FOR 5 DAYS     Performed at [facility]   Report Status PENDING   Incomplete  CULTURE, BLOOD (ROUTINE X 2)     Status: None   Collection Time    01/19/13  9:00 PM      Result Value Range Status   Specimen Description BLOOD LEFT HAND   Final   Special Requests BOTTLES DRAWN AEROBIC ONLY 3CC  Final   Culture  Setup Time     Final   Value: 01/20/2013 01:46     Performed at [facility]   Culture     Final   Value:        BLOOD CULTURE RECEIVED NO GROWTH TO DATE CULTURE WILL BE HELD FOR 5 DAYS BEFORE ISSUING A FINAL NEGATIVE REPORT     Performed at [facility]   Report Status PENDING   Incomplete  CULTURE, BLOOD (ROUTINE X 2)     Status: None   Collection Time    01/19/13  9:10 PM      Result Value Range Status   Specimen Description BLOOD RIGHT HAND   Final   Special Requests BOTTLES DRAWN AEROBIC ONLY 3CC   Final   Culture  Setup Time     Final   Value: 01/20/2013 01:46     Performed at [facility]   Culture     Final   Value:        BLOOD CULTURE RECEIVED NO GROWTH TO DATE CULTURE WILL BE HELD FOR 5 DAYS BEFORE ISSUING A FINAL NEGATIVE REPORT     Performed at [facility]   Report Status PENDING   Incomplete  URINE CULTURE     Status: None   Collection Time    01/20/13  1:36 AM      Result Value Range Status   Specimen Description URINE, CLEAN CATCH   Final   Special Requests NONE   Final   Culture  Setup Time     Final   Value: 01/20/2013 03:10     Performed at [facility] Count     Final   Value: 4,000 COLONIES/ML     Performed at [facility]   Culture     Final   Value: INSIGNIFICANT GROWTH     Performed at [facility]   Report Status 01/21/2013 FINAL   Final   Studies/Results: No results found. Medications: I have reviewed the patient's current medications. Scheduled Meds: . aspirin  81 mg Oral Daily  . carvedilol  25 mg Oral BID WC  . doxycycline  100 mg Oral Q12H  . heparin  5,000 Units Subcutaneous Q8H  . insulin aspart  0-9 Units Subcutaneous TID WC & HS  . insulin glargine  5 Units Subcutaneous QHS  . lisinopril  40 mg Oral Daily  . loratadine  10 mg Oral Daily  . pantoprazole  20 mg Oral Daily  . simvastatin  20 mg Oral Daily  . triamcinolone ointment  1 application Topical  BID   Continuous Infusions: . sodium chloride 75 mL/hr at 01/21/13 1355   PRN Meds:.albuterol, morphine injection, ondansetron (ZOFRAN) IV, oxyCODONE Assessment/Plan: Principal Problem:   Abscess of buttock, right Active Problems:   DIABETES MELLITUS, TYPE II   HYPERLIPIDEMIA   HYPERTENSION   GERD   Meningioma   Chronic systolic congestive heart failure   UTI (urinary tract infection)  # Abscess-right buttock: Improved.  CT showed amorphous collection within the right gluteal region adjacent to the gluteal fold.  Surgery was consulted and I&D was performed. Patient tolerated the procedure well; wound now packed w/ gauze. Currently patient is hemodynamically stable and afebrile. Her pain is well controlled. Patient does not meet the criteria for sepsis. She was started on vancomycin and zosyn per surgery.  Wound culture reveals staph aureus; sensitivities are pending;  blood culture x 2 show no growth  to date.  Oxycodone 5 to 10 mg q4h PRN for pain.  Pt had difficulty keeping anything down and vomited at least twice today after breakfast and lunch yesterday.  She was started on a clear liquid diet yesterday evening.  She was able to eat mashed potatoes and carrots yesterday evening without having any nausea or vomiting.  This morning she had drank some juice and we interrupted her breakfast.  She thinks she may be able to eat some solid food for lunch so we will try her on this.  Her wound looks good with minimal serosanguineous drainage.    -d/c bactrim given AKI -start doxycycline 100mg  bid  #AKI: Pt creatinine increased from 0.72 to 1.22 within 48 hours.  Etiology could be from volume depletion since pt had several episodes of emesis yesterday and/or nephrotoxicity from vancomycin/zosyn.   Today, her creatinine has increased to 1.78.  This is most likely attributable to vancomycin/zosyn since pt has experienced no more episodes of emesis and she is getting IVF.  Bactrim is also a  possibility but her creatinine had already increased before beginning bactrim.    Lab Results  Component Value Date   CREATININE 1.78* 01/22/2013   -d/c bactrim -start doxycycline 100mg  bid -IVF @75ml /hr -recheck BMET in AM   #Hypokalemia: Resolved.  K 3.2 on admission. Likely due to lasix therapy. Pt is not on KCl supplementation at home.  BMET this AM revealed a potassium of 4.0.  Lab Results  Component Value Date   K 4.0 01/22/2013   -check BMET in AM   # Bladder outlet obstruction: Stable.  CT scan showed possible filling defect in the posterior aspect of the urinary bladder and concern of chronic bladder outlet obstruction. Patient was recently treated for UTI with ciprofloxacin. Per patient's daughter, patient's dysuria resolved, but patient still has increased urinary frequency (1-2 weeks). Initial urine collection was not a clean catch. It is possible that patient has recurrent UTI. Patient is being treated w/ zosyn for abscess, which should also treat a UTI if present. Patient symptoms might be related to the findings on CT scan.  Pt has no suprapubic tenderness so probably not necessary to treat UTI.  Pt should have urology referral as outpatient workup for possible chronic bladder outlet obstruction  -bladder scan yesterday revealed PVR of 54cc  # DM2: Last Hgb A1C of 12.1% on 12/23/12. Patient was supposed to be on Lantus 10 units at bedtime daily and metformin 1000 mg twice a day at home. It seems that patient may not have been completely compliant to this regimen. She is currently living with her daughter, who is not very sure whether patient is doing insulin injection correctly.   -Continue lantus 8 units qhs and SSI  CBG (last 3)   Recent Labs  01/21/13 2225 01/22/13 0759 01/22/13 1154  GLUCAP 179* 133* 164*    # HTN: Stable.  BP is 131/64 mmHg on admission. She is on lisinopril 40mg  daily and Coreg 25 mg at home.  -Continue home meds  # History of RLL  adenocarcinoma: Stable. She is s/p right lower lobectomy 12/15/10. Her recent chest CT on 05/07/12 showed small left pleural effusion and no evidence of pleural effusion on the right. Patient was followed up by oncology, Dr. Arbutus Ped in the past. Patient has Not followed up with oncologist since 07/2011. Currently patient does not have cough, shortness of breath or chest pain. She is using albuterol inhaler prn at home.  -Continue Albuterol inhaler PRN   #  Chronic sCHF: Stable. Echo on 06/2010 with EF of 15-20% and Myoview showing thinning of apex, however, myoview on 12/12/10 with improved EF of 40% with nonischemic CM. Patient is on Lasix 20 mg daily, aspirin, Coreg 25 mg twice a day, lisinopril 40 mg daily at home. Weight was 205lbs on 12/23/12-->207lbs today. Clinically patient is euvolemic without JVD and the leg edema and lungs CTAB, therefore no concern for acute exacerbation at this time.  Continue to hold lasix given patient is at risk of developing sepsis.  IVF at 20ml/hr.  Continue ACEi, BB, ASA, and statin.  Daily weights.   # Meningioma: Patient's MRI on 10/2010 showed stable right frontal lobe meningioma compared with MRI on MRI 07/08/2010. Patient does not have an focal neurologic signs.  Pt should follow up as outpt.  #EKG changes: Stable.  EKG w/ ?J point elevation V2, V3, T wave inversion aVL. Patient w/o chest pain or SOB at this time. Given patient's hx of CHF and DM, we wanted to evaluated further w/ one troponin which was negative.    #Unspecified rash: Pt has scattered hyperpigmented plaques w/ overlying adherent scale to bilateral lower extremities. -biopsy site without erythema, swelling, or warmth -punch biopsy pathology results pending; available Monday   Dispo: Disposition is deferred at this time, awaiting improvement of current medical problems.  Anticipated discharge in approximately 1-2 day(s).   The patient does have a current PCP Darden Palmer, MD) and does need an [facility]  hospital follow-up appointment after discharge.    .Services Needed at time of discharge: Y = Yes, Blank = No PT:   OT:   RN:   Equipment:   Other:     LOS: 3 days   Boykin Peek, MD 01/22/2013, 12:07 PM

## 2013-01-22 NOTE — Discharge Summary (Signed)
Name: [name] MRN: [id number] DOB: March 02, 1933 79 y.o. PCP: Darden Palmer, MD  Date of Admission: 01/19/2013  9:04 AM Date of Discharge: 01/27/2013 Attending Physician: Dr. [name], MD  Discharge Diagnosis: Principal Problem:   Abscess of buttock, right Active Problems:   DIABETES MELLITUS, TYPE II   HYPERLIPIDEMIA   HYPERTENSION   GERD   Meningioma   Chronic systolic congestive heart failure   UTI (urinary tract infection)  Discharge Medications:   Medication List    STOP taking these medications       lisinopril 40 MG tablet  Commonly known as:  PRINIVIL,ZESTRIL     metFORMIN 1000 MG tablet  Commonly known as:  GLUCOPHAGE      TAKE these medications       ACCU-CHEK AVIVA PLUS W/DEVICE Kit  1 each by Does not apply route 3 (three) times daily. dx code 250.00 insulin requiring     accu-chek softclix lancets  Check blood sugar 3 times a day before meals dx code 250.00 insulin requiring     albuterol (2.5 MG/3ML) 0.083% nebulizer solution  Commonly known as:  PROVENTIL  Take 3 mLs (2.5 mg total) by nebulization every 6 (six) hours as needed for wheezing.     albuterol 108 (90 BASE) MCG/ACT inhaler  Commonly known as:  PROVENTIL HFA;VENTOLIN HFA  Inhale 2 puffs into the lungs every 4 (four) hours as needed for wheezing.     amLODipine 10 MG tablet  Commonly known as:  NORVASC  Take 1 tablet (10 mg total) by mouth daily.     aspirin 81 MG tablet  Take 81 mg by mouth daily.     carvedilol 25 MG tablet  Commonly known as:  COREG  Take 1 tablet (25 mg total) by mouth 2 (two) times daily with a meal.     [employer] LANCETS MICRO THIN 33G Misc  Check blood sugar 3 times a day before meals dx code 250.00 insulin requiring     doxycycline 100 MG capsule  Commonly known as:  VIBRAMYCIN  Take 1 capsule (100 mg total) by mouth every 12 (twelve) hours. One po bid x 5 days     furosemide 20 MG tablet  Commonly known as:  LASIX  Take 20 mg by mouth  daily.     glucose blood test strip  Commonly known as:  ACCU-CHEK AVIVA PLUS  Check blood sugar 3 times a day before meals dx code 250.00 insulin requiring     HYDROcodone-acetaminophen 5-325 MG per tablet  Commonly known as:  NORCO/VICODIN  TAKE 1 TABLET BY MOUTH TWICE A DAY AS NEEDED FOR PAIN. Do not fill before 01/07/13     Insulin Glargine 100 UNIT/ML Sopn  Commonly known as:  LANTUS SOLOSTAR  Inject 10 Units into the skin at bedtime.     Loratadine 10 MG Caps  Take 1 capsule (10 mg total) by mouth daily.     pantoprazole 20 MG tablet  Commonly known as:  PROTONIX  TAKE 1 TABLET (20 MG TOTAL) BY MOUTH DAILY.     simvastatin 20 MG tablet  Commonly known as:  ZOCOR  TAKE 1 TABLET BY MOUTH EVERY DAY     triamcinolone ointment 0.1 %  Commonly known as:  KENALOG  Apply 1 application topically 2 (two) times daily.        Disposition and follow-up:   [name] was discharged from [facility] in Stable condition.  At the hospital follow up  visit please address:  1. Resolution of AKI 2. Right gluteal abscess (pt receiving [facility] services) 3. Referral to urology for chronic bladder outlet obstruction 4. Right thigh suture removal from punch biopsy 5. Hypertension management-pt had to be d/c on lisinopril due to AKI; renal followed and stated may eventually be able to resume once resolution of AKI 6. Uncontrolled Diabetes Mellitus Management; Consulted [facility] during hospitalization and they are following 7. Right LL adenocarcinoma (see below) 8. Meningioma 9. Left breast mass per CT Chest of 05/07/12  10.  Labs / imaging needed at time of follow-up: BMP  11.  Pending labs/ test needing follow-up: None  Follow-up Appointments: Follow-up Information   Schedule an appointment as soon as possible for a visit with Liz Malady, MD.   Specialty:  General Surgery   Contact information:   [address] [place]  [address] [phone]       Follow up with Dierdre Searles, NA, MD On 01/30/2013. (3:00PM)    Specialty:  Internal Medicine   Contact information:   [address] [place] [address] [phone]       Discharge Instructions: Discharge Orders   Future Appointments Provider Department Dept Phone   01/30/2013 3:00 PM Dede Query, MD [facility] [phone]   Future Orders Complete By Expires   Call MD for:  persistant nausea and vomiting  As directed    Call MD for:  temperature >100.4  As directed    Diet - low sodium heart healthy  As directed    Increase activity slowly  As directed       Consultations: Treatment Team:  [name], MDSurgery, Renal, PT/OT, [facility]  Procedures Performed:  Ct Pelvis W Contrast  01/19/2013   CLINICAL DATA:  Right buttock pain and swelling.  EXAM: CT PELVIS WITH CONTRAST  TECHNIQUE: Multidetector CT imaging of the pelvis was performed using the standard protocol following the bolus administration of intravenous contrast.  CONTRAST:  80mL OMNIPAQUE IOHEXOL 300 MG/ML  SOLN  COMPARISON:  FINDINGS:  In the right gluteal region posterior to the pain as an ill-defined a amorphous region of low attenuation is appreciated within the subcutaneous fat. This area measures 6.7 x 3.8 cm in AP by transverse dimensions. This area is not wall golf and there is inflammatory change and surrounding new mesenteric fat. Finding has the appearance of a phlegmon. Best appreciated on image 47 series 3.  The urinary bladder wall is diffusely thickened demonstrates areas of trabeculation. A bilobed filling defects identified within the posterior lateral aspect of the urinary bladder on the left image number 30 series 3 measuring 1.9 cm in longitudinal dimensions. There is no evidence of intrapelvic free fluid nor loculated fluid collections. Coarse atherosclerotic calcifications identified within the distal abdominal aorta. There is no evidence of bowel obstruction.   IMPRESSION: 1. A amorphous collection within the right gluteal region adjacent to the gluteal fold having the appearance of a phlegmon. 2. Findings raising diagnostic concern of chronic bladder outlet obstruction. There are also findings concerning for filling defect in the posterior aspect of the urinary bladder as described above further evaluation with direct visualization recommended   Electronically Signed   By: Salome Holmes M.D.   On: 01/19/2013 14:05    2D Echo: None  Cardiac Cath: None  Admission HPI: Ms. Gawthrop is a 79yo female w/ hx of HTN, DM type 2 (uncontrolled, A1c 12.8% on 12/23/12) , HL, CVA, CHF, R  lung adenocarcinoma who presents for right sided buttock pain x 2 days.  Patient reports having onset of pain to R lower buttock since yesterday. She did not notice it draining any fluid nor has she had F/C, night sweats, change in appetite, malaise. Denies similar lesions elsewhere or prior similar episode. Patient c/o increased urinary frequency (once per hour??) x 1-2 weeks, denies dysuria, hematuria. Patient was seen in [facility] 12/23/2012 for dysuria and increased urinary frequency at which time she was treated for complicated UTI w/ ciprofloxacin 500mg  BID for 7 days, pt and daughter note compliance w/ this medication. Dysuria has completely resolved from the last UTI. UCx at this time grew "multiple morphotypes of bacteria, w/ no predominant type." Patient denies abd pain, N/V/D, CP, SOB, cough.  With regard to her DM, patient reports taking her lantus at home, though it is unclear if she is actually doing this as her A1c is uncontrolled and there was confusion between pt and daughters regarding her current dosing. Per chart review, patient was recently seen in [facility] at which time her lantus was increased from 10U to 15U qHS. She is also on metformin 1000mg  BID, which daughter reports she does take.  In ED, VS. CMP revealed K 3.2, glucose 414. CBC showed WBC 13.4, Hb 11.7 (approx baseline). UA was  contaminated. CT pelvis with contrast showed amorphous collection (6.7 x 3.8) within right gluteal region having appearance of phlegmon. CT also revealed findings raising concern for chronic bladder outlet obstruction as well as a filling defect in the posterior aspect of the urinary bladder. Patient received one dose of zosyn (?vanc ordered but never given, then canceled).  Of note, patient lives at home with her daughter who helps her with her medications, though neither pt nor daughter had clear picture of patient's medications. Pt ambulates at home w/ a cane and is able to perform ADLs and some IADLs per family.   Hospital Course by problem list: Principal Problem:   Abscess of buttock, right Active Problems:   DIABETES MELLITUS, TYPE II   HYPERLIPIDEMIA   HYPERTENSION   GERD   Meningioma   Chronic systolic congestive heart failure   UTI (urinary tract infection)  #Abscess-right perirectal:  Improved.  CT showed amorphous collection within the right gluteal region adjacent to the gluteal fold.  Surgery was consulted and I&D was performed. Patient tolerated the procedure well; wound now packed w/ gauze. Currently patient is hemodynamically stable and afebrile. Her pain is well controlled. Patient does not meet the criteria for sepsis. She was started on vancomycin and zosyn per surgery.  Was changed to bactrim and then doxycycline after AKI.  Wound culture reveals MRSA; sensitivities available.  Sensitive to tetracycline.   Blood culture x 2 show no growth at 5 days.  Oxycodone 5 to 10 mg q4h PRN for pain.  Pt had some N/V thought to be due to antibiotics, but resolved upon discharge.  Her wound looks good with minimal serosanguineous drainage.  Continue doxycycline for 5 more days upon discharge.  PT/OT consulted and recommended continued services at home.  Pt will be receiving [facility].  #AKI: Resolving. Pt creatinine increased from 0.72 to 1.22 within 48 hours.  Pt had  contrast and renal consulted and etiology thought to be contrast nephropathy.  She received IVF and her creatinine was trending down and should continue to decline.  Creatinine should be checked at next PCP visit.     #Hypokalemia: Resolved.  K 3.2 on admission.  Likely due to lasix therapy. Pt is not on KCl supplementation at home.  BMET this AM revealed a potassium of 4.0.  # Bladder outlet obstruction: Stable.  CT scan showed possible filling defect in the posterior aspect of the urinary bladder and concern of chronic bladder outlet obstruction. Patient was recently treated for UTI with ciprofloxacin. Per patient's daughter, patient's dysuria resolved, but patient still has increased urinary frequency (1-2 weeks). Initial urine collection was not a clean catch. It is possible that patient has recurrent UTI. Patient is being treated w/ zosyn for abscess, which should also treat a UTI if present. Patient symptoms might be related to the findings on CT scan.  Pt has no suprapubic tenderness so probably not necessary to treat UTI.  Pt should have urology referral as outpatient workup for possible chronic bladder outlet obstruction.  Bladder scan revealed PVR of 54cc.   # Uncontrolled DM2: Last Hgb A1C of 12.1% on 12/23/12. Patient was supposed to be on Lantus 10 units at bedtime daily and metformin 1000 mg twice a day at home. It seems that patient may not have been completely compliant to this regimen. She is currently living with her daughter, who is not very sure whether patient is doing insulin injection correctly.  I consulted [facility] upon discharge to help facilitate better control of blood sugars and other medical problems.  She and her daughter agreed to have services.    # Uncontrolled HTN: BP is 131/64 mmHg on admission. She is on lisinopril 40mg  daily and Coreg 25 mg at home.  We had to discontinue lisinopril due to AKI.  She was started on amlodipine 10mg  while inpatient, however, her BP continued to be  elevated.  Renal recommends possible restarting her lisinopril when her AKI resolves.   # History of RLL adenocarcinoma: Stable. She is s/p right lower lobectomy 12/15/10. Her recent chest CT on 05/07/12 showed small left pleural effusion and no evidence of pleural effusion on the right. Patient was followed up by oncology, Dr. Arbutus Ped in the past. Patient has not followed up with oncologist since 07/2011. Currently patient does not have cough, shortness of breath or chest pain. She is using albuterol inhaler prn at home. We continued Albuterol inhaler PRN.  # Chronic sCHF: Stable. Echo on 06/2010 with EF of 15-20% and Myoview showing thinning of apex, however, myoview on 12/12/10 with improved EF of 40% with nonischemic CM. Patient is on Lasix 20 mg daily, aspirin, Coreg 25 mg twice a day, lisinopril 40 mg daily at home. Weight was 205lbs on 12/23/12-->207lbs today. Clinically patient is euvolemic without JVD and the leg edema and lungs CTAB, therefore no concern for acute exacerbation at this time.  Continued to hold lasix given patient is at risk of developing sepsis.  IVF were started at 82ml/hr.  We had to d/c ACEi due to AKI.  We continued BB, ASA, and statin.  Started on amlodipine 10mg  for better BP control. Daily weights.   # Meningioma: Patient's MRI on 10/2010 showed stable right frontal lobe meningioma compared with MRI on 07/08/2010. Patient does not have an focal neurologic signs. Pt should follow up as outpt.  #EKG changes: Stable.  EKG w/ ?J point elevation V2, V3, T wave inversion aVL. Patient w/o chest pain or SOB at this time. Given patient's hx of CHF and DM, we wanted to evaluated further w/ one troponin which was negative.    #Psoriasis vs. dermatitis: Pt has scattered hyperpigmented plaques w/ overlying adherent scale to  bilateral lower extremities. Punch biopsy performed.  Site without erythema, swelling, or warmth.  Sutures will need to be removed on 01/28/13.   Discharge Vitals:     BP 182/64  Pulse 62  Temp(Src) 98.6 F (37 C) (Oral)  Resp 18  Ht 5\' 3"  (1.6 m)  Wt 93.169 kg (205 lb 6.4 oz)  BMI 36.39 kg/m2  SpO2 99%  Discharge Labs:  Results for orders placed during the hospital encounter of 01/19/13 (from the past 24 hour(s))  BASIC METABOLIC PANEL     Status: Abnormal   Collection Time    01/27/13  5:35 AM      Result Value Range   Sodium 149 (*) 135 - 145 mEq/L   Potassium 3.9  3.5 - 5.1 mEq/L   Chloride 116 (*) 96 - 112 mEq/L   CO2 24  19 - 32 mEq/L   Glucose, Bld 83  70 - 99 mg/dL   BUN 18  6 - 23 mg/dL   Creatinine, Ser 9.60 (*) 0.50 - 1.10 mg/dL   Calcium 8.5  8.4 - 45.4 mg/dL   GFR calc non Af Amer 17 (*) >90 mL/min   GFR calc Af Amer 20 (*) >90 mL/min  GLUCOSE, CAPILLARY     Status: None   Collection Time    01/27/13  7:53 AM      Result Value Range   Glucose-Capillary 88  70 - 99 mg/dL  GLUCOSE, CAPILLARY     Status: Abnormal   Collection Time    01/27/13 11:55 AM      Result Value Range   Glucose-Capillary 168 (*) 70 - 99 mg/dL   Comment 1 Notify RN      Signed: Boykin Peek, MD 01/27/2013, 11:33 PM   Time Spent on Discharge: 55 minutes Services Ordered on Discharge: PT, OT, Nursing  Equipment Ordered on Discharge: DME 3 in 1; DME Walker rolling

## 2013-01-22 NOTE — Progress Notes (Addendum)
3 Days Post-Op  Subjective: No complaints.  Changed to oral abx yesterday. Pain continues to improve  Objective: Vital signs in last 24 hours: Temp:  [98.1 F (36.7 C)-98.5 F (36.9 C)] 98.2 F (36.8 C) (12/07 0516) Pulse Rate:  [67-72] 72 (12/07 0516) Resp:  [20] 20 (12/07 0516) BP: (118-146)/(62-76) 146/73 mmHg (12/07 0516) SpO2:  [97 %-100 %] 100 % (12/07 0516) Weight:  [203 lb 8 oz (92.307 kg)] 203 lb 8 oz (92.307 kg) (12/07 0521) Last BM Date: 01/18/13  Intake/Output from previous day: 12/06 0701 - 12/07 0700 In: 1085 [P.O.:480; I.V.:455; IV Piggyback:150] Out: -  Intake/Output this shift:    General appearance: cooperative and no distress Skin: wound healing okay, clean, tenderness improved, erythema resolved  Lab Results:   Recent Labs  01/19/13 0925 01/20/13 0532  WBC 13.4* 15.0*  HGB 11.7* 10.6*  HCT 36.0 34.7*  PLT 306 292   BMET  Recent Labs  01/21/13 0420 01/22/13 0435  NA 145 142  K 3.2* 4.0  CL 109 107  CO2 28 25  GLUCOSE 154* 152*  BUN 18 19  CREATININE 1.22* 1.78*  CALCIUM 7.9* 7.9*   PT/INR No results found for this basename: LABPROT, INR,  in the last 72 hours ABG  Recent Labs  01/19/13 0943 01/19/13 1102  PHART  --  7.460*  HCO3 32.8* 28.4*    Studies/Results: No results found.  Anti-infectives: Anti-infectives   Start     Dose/Rate Route Frequency Ordered Stop   01/21/13 2200  sulfamethoxazole-trimethoprim (BACTRIM DS) 800-160 MG per tablet 2 tablet  Status:  Discontinued     2 tablet Oral Every 12 hours 01/21/13 1250 01/21/13 1358   01/21/13 2200  sulfamethoxazole-trimethoprim (BACTRIM DS) 800-160 MG per tablet 1 tablet     1 tablet Oral Every 12 hours 01/21/13 1358     01/21/13 1200  vancomycin (VANCOCIN) IVPB 750 mg/150 ml premix  Status:  Discontinued     750 mg 150 mL/hr over 60 Minutes Intravenous Every 12 hours 01/21/13 0732 01/21/13 1240   01/20/13 0000  vancomycin (VANCOCIN) 1,250 mg in sodium chloride 0.9 %  250 mL IVPB  Status:  Discontinued     1,250 mg 166.7 mL/hr over 90 Minutes Intravenous Every 12 hours 01/19/13 2308 01/21/13 0732   01/19/13 1200  piperacillin-tazobactam (ZOSYN) IVPB 3.375 g  Status:  Discontinued     3.375 g 12.5 mL/hr over 240 Minutes Intravenous Every 8 hours 01/19/13 1110 01/21/13 1352   01/19/13 1200  vancomycin (VANCOCIN) IVPB 1000 mg/200 mL premix  Status:  Discontinued     1,000 mg 200 mL/hr over 60 Minutes Intravenous Every 12 hours 01/19/13 1110 01/19/13 1459      Assessment/Plan: s/p Procedure(s): IRRIGATION AND DEBRIDEMENT PERIRECTAL ABSCESS (N/A) WOund looks good, continue wound care. Creatinine elevated, management per primary team  LOS: 3 days    Lodema Pilot DAVID 01/22/2013

## 2013-01-22 NOTE — Evaluation (Signed)
Physical Therapy Evaluation Patient Details Name: [name] MRN: [id number] DOB: 02-10-1934 Today's Date: 01/22/2013 Time: 4098-1191 PT Time Calculation (min): 14 min  PT Assessment / Plan / Recommendation History of Present Illness  Patient reports having onset of pain to R lower buttock since yesterday. She did not notice it draining any fluid nor has she had F/C, night sweats, change in appetite, malaise. Abscess R buttock - surgery took pt to OR for I&D  Clinical Impression  Patient is s/p above surgery resulting in functional limitations due to the deficits listed below (see PT Problem List).  Patient will benefit from skilled PT to increase their independence and safety with mobility to allow discharge to the venue listed below.    MD updated on DC recommendations     PT Assessment  Patient needs continued PT services    Follow Up Recommendations  Home health PT;Other (comment) (HHOT/RN and possibly Aide as well)    Does the patient have the potential to tolerate intense rehabilitation      Barriers to Discharge        Equipment Recommendations  3in1 (PT)    Recommendations for Other Services OT consult   Frequency Min 3X/week    Precautions / Restrictions Precautions Precautions: Fall   Pertinent Vitals/Pain Sore R buttock with antalgic lean in sitting patient repositioned for comfort       Mobility  Bed Mobility Bed Mobility: Not assessed Details for Bed Mobility Assistance: Pt was sitting EOB upon my arrival Transfers Transfers: Sit to Stand;Stand to Sit Sit to Stand: 4: Min assist;From bed;With upper extremity assist Stand to Sit: 4: Min assist;To chair/3-in-1;With armrests Details for Transfer Assistance: Cues for safety and hand placement Ambulation/Gait Ambulation/Gait Assistance: 4: Min guard Ambulation Distance (Feet): 6 Feet Assistive device: Rolling walker Ambulation/Gait Assistance Details: Fatigued quickly, but overall steady Gait  Pattern: Decreased step length - right;Decreased step length - left    Exercises     PT Diagnosis: Difficulty walking;Generalized weakness;Acute pain  PT Problem List: Decreased strength;Decreased activity tolerance;Decreased balance;Decreased mobility;Decreased knowledge of use of DME;Pain PT Treatment Interventions: DME instruction;Gait training;Stair training;Functional mobility training;Therapeutic activities;Therapeutic exercise;Patient/family education     PT Goals(Current goals can be found in the care plan section) Acute Rehab PT Goals Patient Stated Goal: wants to go home PT Goal Formulation: With patient Time For Goal Achievement: 01/29/13 Potential to Achieve Goals: Good  Visit Information  Last PT Received On: 01/22/13 Assistance Needed: +1 History of Present Illness: Patient reports having onset of pain to R lower buttock since yesterday. She did not notice it draining any fluid nor has she had F/C, night sweats, change in appetite, malaise. Abscess R buttock - surgery took pt to OR for I&D       Prior Functioning  Home Living Family/patient expects to be discharged to:: Private residence Living Arrangements: Children Available Help at Discharge: Family;Available 24 hours/day (per pt) Type of Home: House Home Access: Stairs to enter [employer] of Steps: 4 Entrance Stairs-Rails: Right Home Layout: One level Home Equipment: Walker - 2 wheels;Cane - single point Prior Function Level of Independence: Independent with assistive device(s) (For simple moblity) Comments: Per MD note, family states pt can do most ADLs and some IADLs Communication Communication: No difficulties    Cognition  Cognition Arousal/Alertness: Awake/alert Behavior During Therapy: WFL for tasks assessed/performed Overall Cognitive Status: Within Functional Limits for tasks assessed    Extremity/Trunk Assessment Upper Extremity Assessment Upper Extremity Assessment: Overall WFL  for tasks assessed  Lower Extremity Assessment Lower Extremity Assessment: Generalized weakness   Balance    End of Session PT - End of Session Activity Tolerance: Patient tolerated treatment well Patient left: in chair;with call bell/phone within reach;with chair alarm set Nurse Communication: Mobility status  GP     Van Clines [facility] [place], [place] [phone]  01/22/2013, 1:19 PM

## 2013-01-23 DIAGNOSIS — L0231 Cutaneous abscess of buttock: Secondary | ICD-10-CM | POA: Diagnosis not present

## 2013-01-23 DIAGNOSIS — N179 Acute kidney failure, unspecified: Secondary | ICD-10-CM | POA: Diagnosis not present

## 2013-01-23 DIAGNOSIS — N059 Unspecified nephritic syndrome with unspecified morphologic changes: Secondary | ICD-10-CM | POA: Diagnosis not present

## 2013-01-23 DIAGNOSIS — E876 Hypokalemia: Secondary | ICD-10-CM | POA: Diagnosis not present

## 2013-01-23 DIAGNOSIS — A4902 Methicillin resistant Staphylococcus aureus infection, unspecified site: Secondary | ICD-10-CM | POA: Diagnosis not present

## 2013-01-23 LAB — BASIC METABOLIC PANEL
BUN: 20 mg/dL (ref 6–23)
Chloride: 109 mEq/L (ref 96–112)
Creatinine, Ser: 2.47 mg/dL — ABNORMAL HIGH (ref 0.50–1.10)
GFR calc Af Amer: 21 mL/min — ABNORMAL LOW (ref >90)
GFR calc non Af Amer: 18 mL/min — ABNORMAL LOW (ref >90)
Potassium: 3.9 mEq/L (ref 3.5–5.1)
Sodium: 142 mEq/L (ref 135–145)

## 2013-01-23 LAB — CBC
HCT: 31.3 % — ABNORMAL LOW (ref 36.0–46.0)
Hemoglobin: 9.5 g/dL — ABNORMAL LOW (ref 12.0–15.0)
MCH: 24.5 pg — ABNORMAL LOW (ref 26.0–34.0)
MCHC: 30.4 g/dL (ref 30.0–36.0)
Platelets: 302 10*3/uL (ref 150–400)
RDW: 15.8 % — ABNORMAL HIGH (ref 11.5–15.5)

## 2013-01-23 LAB — GLUCOSE, CAPILLARY
Glucose-Capillary: 149 mg/dL — ABNORMAL HIGH (ref 70–99)
Glucose-Capillary: 124 mg/dL — ABNORMAL HIGH (ref 70–99)
Glucose-Capillary: 125 mg/dL — ABNORMAL HIGH (ref 70–99)

## 2013-01-23 NOTE — Progress Notes (Signed)
General Surgery [facility]  Patient seen.  Agree with attached note.  Will follow.  Velora Heckler, MD, [facility] Office: [phone]

## 2013-01-23 NOTE — Consult Note (Signed)
[name] 01/23/2013 [name] Miss Requesting Physician:  Rogelia Boga  Reason for Consult:  AKI HPI:  30F DM, HTN, no known CKD admitted 12/4 with perirectal abscess s/p I&D treated with Vanc/Zosyn now on doxy.  She had CT Pelvis w/ contrast at that time. Has had progressive inc in SCr since that time, peaking at 2.47 today.  No NSAID exposure or clear hypotension.  K and HCO3 stable.  She remains on ACEi but metformin has been stopped.  Pt able to eat and drink; denies thirst.  No complaints now of SOB or CP.  Some concern during hospitalization for obstructive uropathy, but pt had normal PVR during admission.     Creat (mg/dL)  Date Value  08/06/3084 0.66   04/29/2012 0.69   11/05/2011 0.58   11/03/2011 0.55   07/20/2011 0.9   04/15/2011 0.63   12/02/2010 0.83   07/21/2010 1.02      Creatinine, Ser (mg/dL)  Date Value  57/09/4694 2.47*  01/22/2013 1.78*  01/21/2013 1.22*  01/20/2013 1.01   01/19/2013 0.72   05/08/2012 0.75   11/14/2011 0.60   10/28/2011 0.61   01/07/2011 0.63   12/20/2010 0.61   ] I/Os: I/O last 3 completed shifts: In: 1665 [P.O.:240; I.V.:1425] Out: -   ROS Balance of 12 systems is negative w/ exceptions as above  PMH  Past Medical History  Diagnosis Date  . Diabetes mellitus 2007    HgA1C (02/20/2010) = 9.2, HgA1C (03/20/2009) = 12.1  . Hyperlipidemia   . Hypertension   . GERD (gastroesophageal reflux disease)   . CVA (cerebrovascular accident) 2006     right embolic stroke, no residual deficits  . Diverticulitis   . CVA (cerebral infarction) 7-yrs ago  . Arthritis   . Dysphagia   . CHF (congestive heart failure)   . DIABETES MELLITUS, TYPE II 12/04/2005  . HYPERLIPIDEMIA 12/04/2005  . HYPERTENSION 12/04/2005  . GERD 12/04/2005  . ARTHRITIS, KNEE 03/24/2006  . Lung mass 07/08/2010  . Meningioma 07/21/2010  . Hemangioma of liver 12/02/2010  . Depression 12/02/2010  . Adenocarcinoma of lung      Right upper lobe adenocarcinoma.    PSH  Past  Surgical History  Procedure Laterality Date  . Abdominal hysterectomy    . Video bronchoscope.  12/29/2007    [place]  . Wide excision of left upper back mass.    . Extracapsular cataract extraction with intraocular      lens implantation.  . Right vats,right thoracotomy,right lower lobectomy with node dissection    . Incision and drainage perirectal abscess N/A 01/19/2013    Procedure: IRRIGATION AND DEBRIDEMENT PERIRECTAL ABSCESS;  Surgeon: Liz Malady, MD;  Location: [facility] OR;  Service: General;  Laterality: N/A;   FH  Family History  Problem Relation Age of Onset  . Hyperlipidemia Brother   . Hypertension Brother   . Diabetes Brother    SH  reports that she quit smoking about 12 years ago. Her smoking use included Cigarettes. She smoked 0.00 packs per day. She has never used smokeless tobacco. She reports that she does not drink alcohol or use illicit drugs. Allergies No Known Allergies Home medications Prior to Admission medications   Medication Sig Start Date End Date Taking? Authorizing Provider  albuterol (PROVENTIL HFA;VENTOLIN HFA) 108 (90 BASE) MCG/ACT inhaler Inhale 2 puffs into the lungs every 4 (four) hours as needed for wheezing. 01/03/13  Yes Darden Palmer, MD  albuterol (PROVENTIL) (2.5 MG/3ML) 0.083% nebulizer solution Take 3 mLs (2.5  mg total) by nebulization every 6 (six) hours as needed for wheezing. 11/03/11  Yes Mathis Dad, MD  aspirin 81 MG tablet Take 81 mg by mouth daily.    Yes Historical Provider, MD  Blood Glucose Monitoring Suppl (ACCU-CHEK AVIVA PLUS) W/DEVICE KIT 1 each by Does not apply route 3 (three) times daily. dx code 250.00 insulin requiring 12/08/12  Yes Darden Palmer, MD  carvedilol (COREG) 25 MG tablet Take 1 tablet (25 mg total) by mouth 2 (two) times daily with a meal. 01/03/13  Yes Darden Palmer, MD  [employer] LANCETS MICRO THIN 33G MISC Check blood sugar 3 times a day before meals dx code 250.00 insulin requiring 08/16/12  Yes Darden Palmer, MD   furosemide (LASIX) 20 MG tablet Take 20 mg by mouth daily.   Yes Historical Provider, MD  glucose blood (ACCU-CHEK AVIVA PLUS) test strip Check blood sugar 3 times a day before meals dx code 250.00 insulin requiring 12/08/12  Yes Darden Palmer, MD  HYDROcodone-acetaminophen (NORCO/VICODIN) 5-325 MG per tablet TAKE 1 TABLET BY MOUTH TWICE A DAY AS NEEDED FOR PAIN. Do not fill before 01/07/13 01/07/13 02/06/13 Yes Darden Palmer, MD  Insulin Glargine (LANTUS SOLOSTAR) 100 UNIT/ML SOPN Inject 10 Units into the skin at bedtime. 09/08/12  Yes Darden Palmer, MD  Lancet Devices [facility]) lancets Check blood sugar 3 times a day before meals dx code 250.00 insulin requiring 12/08/12  Yes Darden Palmer, MD  lisinopril (PRINIVIL,ZESTRIL) 40 MG tablet Take 40 mg by mouth daily.   Yes Historical Provider, MD  Loratadine 10 MG CAPS Take 1 capsule (10 mg total) by mouth daily. 12/23/12  Yes Ejiroghene Emokpae, MD  metFORMIN (GLUCOPHAGE) 1000 MG tablet 2 times daily by mouth with food 01/03/13  Yes Darden Palmer, MD  pantoprazole (PROTONIX) 20 MG tablet TAKE 1 TABLET (20 MG TOTAL) BY MOUTH DAILY. 11/19/12  Yes Darden Palmer, MD  simvastatin (ZOCOR) 20 MG tablet TAKE 1 TABLET BY MOUTH EVERY DAY 09/21/12  Yes Darden Palmer, MD  triamcinolone ointment (KENALOG) 0.1 % Apply 1 application topically 2 (two) times daily. 12/23/12  Yes Kennis Carina, MD    Current Medications Current Facility-Administered Medications  Medication Dose Route Frequency Provider Last Rate Last Dose  . 0.9 %  sodium chloride infusion   Intravenous Continuous Boykin Peek, MD 75 mL/hr at 01/23/13 1138    . albuterol (PROVENTIL HFA;VENTOLIN HFA) 108 (90 BASE) MCG/ACT inhaler 2 puff  2 puff Inhalation Q4H PRN Lorretta Harp, MD      . aspirin chewable tablet 81 mg  81 mg Oral Daily Lorretta Harp, MD   81 mg at 01/23/13 0943  . carvedilol (COREG) tablet 25 mg  25 mg Oral BID WC Lorretta Harp, MD   25 mg at 01/23/13 0942  . doxycycline  (VIBRA-TABS) tablet 100 mg  100 mg Oral Q12H Boykin Peek, MD   100 mg at 01/23/13 0943  . heparin injection 5,000 Units  5,000 Units Subcutaneous Q8H Lorretta Harp, MD   5,000 Units at 01/23/13 [phone]  . insulin aspart (novoLOG) injection 0-9 Units  0-9 Units Subcutaneous TID WC & HS Carlynn Purl, DO   1 Units at 01/23/13 1244  . insulin glargine (LANTUS) injection 5 Units  5 Units Subcutaneous QHS Boykin Peek, MD   5 Units at 01/22/13 2223  . loratadine (CLARITIN) tablet 10 mg  10 mg Oral Daily Lorretta Harp, MD   10 mg at 01/23/13 0942  . morphine 2 MG/ML injection 2 mg  2 mg Intravenous Q2H PRN Liz Malady, MD      . ondansetron [facility]) injection 4 mg  4 mg Intravenous Q8H PRN Boykin Peek, MD      . oxyCODONE (Oxy IR/ROXICODONE) immediate release tablet 5-10 mg  5-10 mg Oral Q4H PRN Liz Malady, MD   5 mg at 01/22/13 2228  . pantoprazole (PROTONIX) EC tablet 20 mg  20 mg Oral Daily Lorretta Harp, MD   20 mg at 01/23/13 0943  . simvastatin (ZOCOR) tablet 20 mg  20 mg Oral Daily Lorretta Harp, MD   20 mg at 01/23/13 0943  . triamcinolone ointment (KENALOG) 0.1 % 1 application  1 application Topical BID Lorretta Harp, MD   1 application at 01/23/13 0943    CBC  Recent Labs Lab 01/19/13 0925 01/20/13 0532 01/23/13 0820  WBC 13.4* 15.0* 8.2  NEUTROABS 10.1*  --   --   HGB 11.7* 10.6* 9.5*  HCT 36.0 34.7* 31.3*  MCV 78.4 80.3 80.7  PLT 306 292 302   Basic Metabolic Panel  Recent Labs Lab 01/19/13 0925 01/20/13 0532 01/21/13 0420 01/22/13 0435 01/23/13 0615  NA 139 142 145 142 142  K 3.2* 3.2* 3.2* 4.0 3.9  CL 97 105 109 107 109  CO2 29 26 28 25 23   GLUCOSE 414* 271* 154* 152* 132*  BUN 16 17 18 19 20   CREATININE 0.72 1.01 1.22* 1.78* 2.47*  CALCIUM 8.9 8.5 7.9* 7.9* 7.9*  PHOS  --   --  3.3  --   --     Physical Exam  Blood pressure 152/72, pulse 71, temperature 98.1 F (36.7 C), temperature source Oral, resp. rate 20, height 5\' 3"  (1.6 m), weight 94.756 kg (208 lb 14.4  oz), SpO2 100.00%. GEN: NAD, sleeping in bed ENT: NCAT EYES: EOMI, muddy sclera CV: RRR, nl s1s2 PULM: CTAB,nl wob ABD: s/nt/nd +BS EXT: 1+ pitting edema   A/P 73F with AKI in setting of perirectal contrast likely from contrast nephropathy.  K, HCO3, volume status are stable.  UOP not able to be measured, but doesn't appear to be anuric.  Pt still on ACEi but metformin has been stopped.   1. Stop lisinopril; follow BP but tolerate SBP < 180 as an inpatient.  Amlodipine reasonable alternative if needed 2. Follow renal function daily 3. Agree with held metformin 4. I see no indication at this time for renal US 5. With time and resolution both metformin and ACEi should be able to restart.  Sabra Heck MD 01/23/2013, 1:38 PM

## 2013-01-23 NOTE — Progress Notes (Signed)
  Date: 01/23/2013  Patient name: [name]  Medical record number: [id number]  Date of birth: May 11, 1933   This patient has been seen and the plan of care was discussed with the house staff. Please see their note for complete details. I concur with their findings with the following additions/corrections: [name] was visiting with family. She has no complaints. She is working with PT. Her creatinine cont to rise. She is on no nephrotoxic agents but did her CT contrast on the 4th that would explain her AKI. Will cont IVF, hold all nephrotoxic agents, and follow Cr. Not able to D/C until Cr trends down. No need for renal consult at this time.   [name], MD 01/23/2013, 1:33 PM

## 2013-01-23 NOTE — Progress Notes (Signed)
Physical Therapy Treatment Patient Details Name: [name] MRN: [id number] DOB: Jul 16, 1933 Today's Date: 01/23/2013 Time: 5621-3086 PT Time Calculation (min): 24 min  PT Assessment / Plan / Recommendation  History of Present Illness Patient reports having onset of pain to R lower buttock since yesterday. She did not notice it draining any fluid nor has she had F/C, night sweats, change in appetite, malaise. Abscess R buttock - surgery took pt to OR for I&D   PT Comments   Pt progressing with mobility today however required increased time max motivation to participate and to continue with PT.  Pt's family present during tx. Per daughter: pt and daughter live together and daughter will be with her when pt is OOB to assist for safety. Pt will benefit from HHPT to maximize functional independence at home and increase mobility. Recommended that pt attempt stair training prior to d/c home.   Follow Up Recommendations  Home health PT     Does the patient have the potential to tolerate intense rehabilitation     Barriers to Discharge        Equipment Recommendations  3in1 (PT)    Recommendations for Other Services OT consult  Frequency Min 3X/week   Progress towards PT Goals Progress towards PT goals: Progressing toward goals  Plan Current plan remains appropriate    Precautions / Restrictions Precautions Precautions: Fall Restrictions Weight Bearing Restrictions: No   Pertinent Vitals/Pain no apparent distress     Mobility  Bed Mobility Bed Mobility: Sit to Sidelying Right;Scooting to HOB Sit to Sidelying Right: 4: Min guard Scooting to HOB: 4: Min guard Details for Bed Mobility Assistance: Pt received on 3n1; cues for log roll technique and vc's without rails and HOB flat  Transfers Transfers: Sit to Stand;Stand to Sit Sit to Stand: 4: Min guard;From chair/3-in-1 Stand to Sit: To bed;4: Min guard Details for Transfer Assistance: min guard for safety; vc's for hand  placement Ambulation/Gait Ambulation/Gait Assistance: 4: Min guard Ambulation Distance (Feet): 50 Feet Assistive device: Rolling walker Ambulation/Gait Assistance Details: Pt continues to fatigue quickly however able to ambulate her household distances; cues for upright posture; motivation to continue Gait Pattern: Step-through pattern;Decreased stride length    Exercises     PT Diagnosis:    PT Problem List:   PT Treatment Interventions:     PT Goals (current goals can now be found in the care plan section)    Visit Information  Last PT Received On: 01/23/13 Assistance Needed: +1 History of Present Illness: Patient reports having onset of pain to R lower buttock since yesterday. She did not notice it draining any fluid nor has she had F/C, night sweats, change in appetite, malaise. Abscess R buttock - surgery took pt to OR for I&D    Subjective Data      Cognition  Cognition Arousal/Alertness: Awake/alert Behavior During Therapy: WFL for tasks assessed/performed Overall Cognitive Status: Within Functional Limits for tasks assessed    Balance     End of Session PT - End of Session Equipment Utilized During Treatment: Gait belt Activity Tolerance: Patient tolerated treatment well Patient left: with call bell/phone within reach;in bed;with family/visitor present Nurse Communication: Mobility status   GP     Ernestina Columbia, SPTA 01/23/2013, 12:54 PM

## 2013-01-23 NOTE — Progress Notes (Signed)
4 Days Post-Op  Subjective: Pt doing well, minimal pain during dressing changes.  Afebrile.  Tolerating diet.  +flatus, no BM yet.  Working with PT/OT.  Objective: Vital signs in last 24 hours: Temp:  [98.1 F (36.7 C)-98.5 F (36.9 C)] 98.1 F (36.7 C) (12/08 0500) Pulse Rate:  [65-71] 71 (12/08 0500) Resp:  [16-20] 20 (12/08 0500) BP: (136-152)/(64-72) 152/72 mmHg (12/08 0500) SpO2:  [98 %-100 %] 100 % (12/08 0500) Weight:  [208 lb 14.4 oz (94.756 kg)] 208 lb 14.4 oz (94.756 kg) (12/08 0500) Last BM Date: 01/18/13  Intake/Output from previous day: 12/07 0701 - 12/08 0700 In: 1665 [P.O.:240; I.V.:1425] Out: -  Intake/Output this shift:    PE: Gen:  Alert, NAD, pleasant Perirectal:  Skin normal flesh colored, no erythema induration or fluctuance, wound bed is pink with good granulation tissue, no necrotic tissue and drainage is serosanguinous  Lab Results:  No results found for this basename: WBC, HGB, HCT, PLT,  in the last 72 hours BMET  Recent Labs  01/21/13 0420 01/22/13 0435  NA 145 142  K 3.2* 4.0  CL 109 107  CO2 28 25  GLUCOSE 154* 152*  BUN 18 19  CREATININE 1.22* 1.78*  CALCIUM 7.9* 7.9*   PT/INR No results found for this basename: LABPROT, INR,  in the last 72 hours CMP     Component Value Date/Time   NA 142 01/22/2013 0435   NA 146* 07/20/2011 1527   K 4.0 01/22/2013 0435   K 4.2 07/20/2011 1527   CL 107 01/22/2013 0435   CL 105 07/20/2011 1527   CO2 25 01/22/2013 0435   CO2 30 07/20/2011 1527   GLUCOSE 152* 01/22/2013 0435   GLUCOSE 129* 07/20/2011 1527   BUN 19 01/22/2013 0435   BUN 14 07/20/2011 1527   CREATININE 1.78* 01/22/2013 0435   CREATININE 0.66 05/06/2012 1555   CALCIUM 7.9* 01/22/2013 0435   CALCIUM 8.6 07/20/2011 1527   PROT 7.3 01/19/2013 0925   PROT 7.5 07/20/2011 1527   ALBUMIN 3.2* 01/19/2013 0925   AST 10 01/19/2013 0925   AST 20 07/20/2011 1527   ALT 8 01/19/2013 0925   ALT 24 07/20/2011 1527   ALKPHOS 85 01/19/2013 0925   ALKPHOS 63 07/20/2011  1527   BILITOT 0.3 01/19/2013 0925   BILITOT 0.50 07/20/2011 1527   GFRNONAA 26* 01/22/2013 0435   GFRAA 31* 01/22/2013 0435   Lipase     Component Value Date/Time   LIPASE 21 11/09/2010 2120       Studies/Results: No results found.  Anti-infectives: Anti-infectives   Start     Dose/Rate Route Frequency Ordered Stop   01/22/13 2100  doxycycline (VIBRA-TABS) tablet 100 mg     100 mg Oral Every 12 hours 01/22/13 1158     01/21/13 2200  sulfamethoxazole-trimethoprim (BACTRIM DS) 800-160 MG per tablet 2 tablet  Status:  Discontinued     2 tablet Oral Every 12 hours 01/21/13 1250 01/21/13 1358   01/21/13 2200  sulfamethoxazole-trimethoprim (BACTRIM DS) 800-160 MG per tablet 1 tablet  Status:  Discontinued     1 tablet Oral Every 12 hours 01/21/13 1358 01/22/13 1155   01/21/13 1200  vancomycin (VANCOCIN) IVPB 750 mg/150 ml premix  Status:  Discontinued     750 mg 150 mL/hr over 60 Minutes Intravenous Every 12 hours 01/21/13 0732 01/21/13 1240   01/20/13 0000  vancomycin (VANCOCIN) 1,250 mg in sodium chloride 0.9 % 250 mL IVPB  Status:  Discontinued  1,250 mg 166.7 mL/hr over 90 Minutes Intravenous Every 12 hours 01/19/13 2308 01/21/13 0732   01/19/13 1200  piperacillin-tazobactam (ZOSYN) IVPB 3.375 g  Status:  Discontinued     3.375 g 12.5 mL/hr over 240 Minutes Intravenous Every 8 hours 01/19/13 1110 01/21/13 1352   01/19/13 1200  vancomycin (VANCOCIN) IVPB 1000 mg/200 mL premix  Status:  Discontinued     1,000 mg 200 mL/hr over 60 Minutes Intravenous Every 12 hours 01/19/13 1110 01/19/13 1459       Assessment/Plan POD #4 s/p I&D perirectal abscess  MRSA positive culture was on zosyn/vanc 2/2 days, now on Doxycycline Day 2/? (sensitive to tetracycline)  Elevated Creatinine 2.47 today 1.  Continue with doxycycline for 7-10 day course depending on improvement 2.  Wound is very clean, WBC today is normal 3.  Ambulate and IS 4.  SCD's and heparin 5.  Okay for discharge when  medically stable with HH services (WD packing dressing changes BID) (placed order for PT/OT/nursing/aid) PT/OT recommending HH as well 6.  F/u with Dr. Janee Morn in 2-3 weeks for post op check    LOS: 4 days    D[facility], [facility] 01/23/2013, 7:58 AM Pager: [phone]

## 2013-01-23 NOTE — Care Management Note (Signed)
  Page 1 of 1   01/23/2013     11:32:06 AM   CARE MANAGEMENT NOTE 01/23/2013  Patient:  [name]   Account Number:  [id number]  Date Initiated:  01/23/2013  Documentation initiated by:  Ronny Flurry  Subjective/Objective Assessment:     Action/Plan:   Anticipated DC Date:     Anticipated DC Plan:  HOME W HOME HEALTH SERVICES         Choice offered to / List presented to:  C-4 Adult Children   DME arranged  3-N-1  Levan Hurst      DME agency  [facility]     [facility] arranged  HH-1 RN  HH-2 PT  HH-3 OT  HH-4 NURSE'S AIDE      Status of service:  Completed, signed off Medicare Important Message given?   (If response is "NO", the following Medicare IM given date fields will be blank) Date Medicare IM given:   Date Additional Medicare IM given:    Discharge Disposition:    Per UR Regulation:    If discussed at Long Length of Stay Meetings, dates discussed:    Comments:  01-23-13 Spoke with patient and 2 daughters at bedside. Patient lives with daughter who confirmed face sheet information and is willing to be shown how to change dressing .  Daughter would like 3 in 1 and rolling walker ordered same done .  Ronny Flurry RN BSN [phone]

## 2013-01-23 NOTE — Evaluation (Signed)
Occupational Therapy Evaluation Patient Details Name: [name] MRN: [id number] DOB: 1933-03-23 Today's Date: 01/23/2013 Time: 4098-1191 OT Time Calculation (min): 29 min  OT Assessment / Plan / Recommendation History of present illness Patient reports having onset of pain to R lower buttock since yesterday. She did not notice it draining any fluid nor has she had F/C, night sweats, change in appetite, malaise. Abscess R buttock - surgery took pt to OR for I&D   Clinical Impression   Pt admitted with above. Pt currently with functional limitations due to the deficits listed below (see OT Problem List), with decreased balance and increased pain. Pt will benefit from skilled OT to increase their safety and independence with ADL and functional mobility for ADL to facilitate discharge to home with HHOT as long as she has 24 S/A; otherwise will need ST SNF.      OT Assessment  Patient needs continued OT Services    Follow Up Recommendations  Home health OT;Supervision/Assistance - 24 hour (if not 24 hour S/A then will need SNF)       Equipment Recommendations  3 in 1 bedside comode (RW)       Frequency  Min 2X/week    Precautions / Restrictions Precautions Precautions: Fall Restrictions Weight Bearing Restrictions: No   Pertinent Vitals/Pain 5/10 right buttock; repositioned, per pt did not need to notify nurse    ADL  Eating/Feeding: Independent Where Assessed - Eating/Feeding: Chair Grooming: Set up Where Assessed - Grooming: Unsupported sitting Upper Body Bathing: Set up Where Assessed - Upper Body Bathing: Unsupported sitting Lower Body Bathing: Moderate assistance Where Assessed - Lower Body Bathing: Supported sit to stand Upper Body Dressing: Set up Where Assessed - Upper Body Dressing: Unsupported sitting Lower Body Dressing: Maximal assistance Where Assessed - Lower Body Dressing: Supported sit to stand Toilet Transfer: Minimal assistance Toilet Transfer Method:  Sit to Barista: Bedside commode Toileting - Clothing Manipulation and Hygiene: Moderate assistance Where Assessed - Toileting Clothing Manipulation and Hygiene: Sit to stand from 3-in-1 or toilet Transfers/Ambulation Related to ADLs: Min A for all with SPC    OT Diagnosis: Generalized weakness;Acute pain  OT Problem List: Decreased strength;Impaired balance (sitting and/or standing);Pain;Decreased knowledge of use of DME or AE OT Treatment Interventions: Self-care/ADL training;Balance training;Patient/family education;DME and/or AE instruction   OT Goals(Current goals can be found in the care plan section) Acute Rehab OT Goals OT Goal Formulation: With patient Time For Goal Achievement: 02/06/13 Potential to Achieve Goals: Good ADL Goals Pt Will Perform Grooming: with supervision;standing Pt Will Perform Lower Body Bathing: with min assist;sit to/from stand Pt Will Perform Lower Body Dressing: with supervision;sit to/from stand Pt Will Transfer to Toilet: with supervision;ambulating;bedside commode (over toilet) Additional ADL Goal #1: Pt will be able to get in and OOB with HOB flat and no rail for BADLs  Visit Information  Last OT Received On: 01/23/13 Assistance Needed: +1 History of Present Illness: Patient reports having onset of pain to R lower buttock since yesterday. She did not notice it draining any fluid nor has she had F/C, night sweats, change in appetite, malaise. Abscess R buttock - surgery took pt to OR for I&D       Prior Functioning     Home Living Family/patient expects to be discharged to:: Private residence Living Arrangements: Children Available Help at Discharge: Family;Available 24 hours/day Type of Home: House Home Access: Stairs to enter [employer] of Steps: 4 Entrance Stairs-Rails: Right Home Layout: One level Home  Equipment: Cane - single point;Shower seat - built in Prior Function Level of Independence:  Independent with assistive device(s) (SPC) Communication Communication: No difficulties Dominant Hand: Left         Vision/Perception Vision - History Baseline Vision: Wears glasses all the time Patient Visual Report: No change from baseline   Cognition  Cognition Arousal/Alertness: Awake/alert Behavior During Therapy: WFL for tasks assessed/performed Overall Cognitive Status: Within Functional Limits for tasks assessed    Extremity/Trunk Assessment Upper Extremity Assessment Upper Extremity Assessment: Overall WFL for tasks assessed     Mobility Bed Mobility Bed Mobility: Supine to Sit;Sitting - Scoot to Edge of Bed Supine to Sit: 6: Modified independent (Device/Increase time);With rails;HOB elevated Sitting - Scoot to Edge of Bed: 6: Modified independent (Device/Increase time);With rail Transfers Transfers: Sit to Stand;Stand to Sit Sit to Stand: 4: Min assist;With upper extremity assist;From bed Stand to Sit: 4: Min assist;With upper extremity assist;To chair/3-in-1 Details for Transfer Assistance: VCs for safe hand placement           End of Session OT - End of Session Activity Tolerance: Patient tolerated treatment well Patient left: in chair;with call bell/phone within reach;with chair alarm set    Evette Georges [phone]  01/23/2013, 9:42 AM

## 2013-01-23 NOTE — Progress Notes (Signed)
Subjective:  Pt denies any pain this AM and is eating well; no issues with N/V/D/C.  She has not had a BM in recent days.  No fever/chills.   Objective: Vital signs in last 24 hours: Filed Vitals:   01/22/13 1445 01/22/13 2100 01/23/13 0500 01/23/13 1515  BP: 136/64 136/66 152/72 158/66  Pulse: 69 65 71 66  Temp: 98.5 F (36.9 C) 98.2 F (36.8 C) 98.1 F (36.7 C) 98.9 F (37.2 C)  TempSrc: Oral Oral Oral Oral  Resp: 18 16 20 17   Height:      Weight:   94.756 kg (208 lb 14.4 oz)   SpO2: 99% 98% 100% 99%   Weight change: 2.449 kg (5 lb 6.4 oz)  Intake/Output Summary (Last 24 hours) at 01/23/13 1936 Last data filed at 01/23/13 1451  Gross per 24 hour  Intake    674 ml  Output      0 ml  Net    674 ml   Physical Exam:   Filed Vitals:   01/23/13 1515  BP: 158/66  Pulse: 66  Temp: 98.9 F (37.2 C)  Resp: 17   General: Elderly female in NAD.  She is sitting up in the bed.   HEENT: EMOI, no scleral icterus  Neck: supple Lungs: CTAB, normal work of respiration, no wheezes, rales, ronchi Heart: regular rate and rhythm, no murmurs, gallops, or rubs, DP 2+ b/l Abdomen: soft, non-tender, non-distended, normal bowel sounds; no suprapubic tenderness  Extremities: warm extremities b/l, no BLE edema  Skin: right thigh s/p punch biospy without erythema, swelling, warmth: open surgical incision approx 3-4cm in length packed w/ gauze to base of R buttock; gauze has minimal fluid; there are also scattered hyperpigmented plaques w/ small amount of overlying adherent scale to bilateral lower extremities  Neurologic: alert & oriented X3, cranial nerves II-XII grossly intact, strength grossly intact  Lab Results: Basic Metabolic Panel:  Recent Labs Lab 01/21/13 0420 01/22/13 0435 01/23/13 0615  NA 145 142 142  K 3.2* 4.0 3.9  CL 109 107 109  CO2 28 25 23   GLUCOSE 154* 152* 132*  BUN 18 19 20   CREATININE 1.22* 1.78* 2.47*  CALCIUM 7.9* 7.9* 7.9*  MG 1.9  --   --   PHOS  3.3  --   --    Liver Function Tests:  Recent Labs Lab 01/19/13 0925  AST 10  ALT 8  ALKPHOS 85  BILITOT 0.3  PROT 7.3  ALBUMIN 3.2*   No results found for this basename: LIPASE, AMYLASE,  in the last 168 hours No results found for this basename: AMMONIA,  in the last 168 hours CBC:  Recent Labs Lab 01/19/13 0925 01/20/13 0532 01/23/13 0820  WBC 13.4* 15.0* 8.2  NEUTROABS 10.1*  --   --   HGB 11.7* 10.6* 9.5*  HCT 36.0 34.7* 31.3*  MCV 78.4 80.3 80.7  PLT 306 292 302   Cardiac Enzymes:  Recent Labs Lab 01/19/13 2225  TROPONINI <0.30   BNP: No results found for this basename: PROBNP,  in the last 168 hours D-Dimer: No results found for this basename: DDIMER,  in the last 168 hours CBG:  Recent Labs Lab 01/22/13 1154 01/22/13 1652 01/22/13 2217 01/23/13 0809 01/23/13 1211 01/23/13 1718  GLUCAP 164* 156* 149* 124* 125* 152*   Drugs of Abuse     Component Value Date/Time   LABOPIA PPS 06/27/2012 1645   LABOPIA NEG 07/02/2006 2021   COCAINSCRNUR NEG 06/27/2012 1645  LABBENZ PPS 06/27/2012 1645   LABBENZ NEG 07/02/2006 2021   AMPHETMU NEG 07/02/2006 2021   LABBARB NEG 06/27/2012 1645    Urinalysis:  Recent Labs Lab 01/19/13 1012 01/20/13 0136  COLORURINE YELLOW YELLOW  LABSPEC 1.040* 1.030  PHURINE 5.0 5.0  GLUCOSEU >1000* 100*  HGBUR MODERATE* LARGE*  BILIRUBINUR NEGATIVE SMALL*  KETONESUR NEGATIVE 15*  PROTEINUR NEGATIVE 100*  UROBILINOGEN 0.2 1.0  NITRITE NEGATIVE NEGATIVE  LEUKOCYTESUR MODERATE* LARGE*    Micro Results: Recent Results (from the past 240 hour(s))  URINE CULTURE     Status: None   Collection Time    01/19/13 10:12 AM      Result Value Range Status   Specimen Description URINE, CLEAN CATCH   Final   Special Requests NONE   Final   Culture  Setup Time     Final   Value: 01/19/2013 12:14     Performed at [facility] Count     Final   Value: >=100,000 COLONIES/ML     Performed at [facility]     Culture     Final   Value: Multiple bacterial morphotypes present, none predominant. Suggest appropriate recollection if clinically indicated.     Performed at [facility]   Report Status 01/20/2013 FINAL   Final  CULTURE, ROUTINE-ABSCESS     Status: None   Collection Time    01/19/13  6:29 PM      Result Value Range Status   Specimen Description ABSCESS PERIRECTAL   Final   Special Requests PATIENT ON FOLLOWING ZOSYN   Final   Gram Stain     Final   Value: MODERATE WBC PRESENT,BOTH PMN AND MONONUCLEAR     NO SQUAMOUS EPITHELIAL CELLS SEEN     MODERATE GRAM POSITIVE COCCI IN CLUSTERS     Performed at [facility]   Culture     Final   Value: MODERATE METHICILLIN RESISTANT STAPHYLOCOCCUS AUREUS     Note: RIFAMPIN AND GENTAMICIN SHOULD NOT BE USED AS SINGLE DRUGS FOR TREATMENT OF STAPH INFECTIONS. This organism DOES NOT demonstrate inducible Clindamycin resistance in vitro. CRITICAL RESULT CALLED TO, READ BACK BY AND VERIFIED WITH: BONNIE GIBBS@[id number]       ON [id number] BY [facility]     Performed at [facility]   Report Status 01/22/2013 FINAL   Final   Organism ID, Bacteria METHICILLIN RESISTANT STAPHYLOCOCCUS AUREUS   Final  ANAEROBIC CULTURE     Status: None   Collection Time    01/19/13  6:29 PM      Result Value Range Status   Specimen Description ABSCESS PERIRECTAL   Final   Special Requests PATIENT ON FOLLOWING ZOSYN   Final   Gram Stain     Final   Value: MODERATE WBC PRESENT,BOTH PMN AND MONONUCLEAR     NO SQUAMOUS EPITHELIAL CELLS SEEN     MODERATE GRAM POSITIVE COCCI IN CLUSTERS     Performed at [facility]   Culture     Final   Value: NO ANAEROBES ISOLATED; CULTURE IN PROGRESS FOR 5 DAYS     Performed at [facility]   Report Status PENDING   Incomplete  CULTURE, BLOOD (ROUTINE X 2)     Status: None   Collection Time    01/19/13  9:00 PM      Result Value Range Status   Specimen Description BLOOD LEFT HAND   Final   Special  Requests BOTTLES DRAWN  AEROBIC ONLY 3CC   Final   Culture  Setup Time     Final   Value: 01/20/2013 01:46     Performed at [facility]   Culture     Final   Value:        BLOOD CULTURE RECEIVED NO GROWTH TO DATE CULTURE WILL BE HELD FOR 5 DAYS BEFORE ISSUING A FINAL NEGATIVE REPORT     Performed at [facility]   Report Status PENDING   Incomplete  CULTURE, BLOOD (ROUTINE X 2)     Status: None   Collection Time    01/19/13  9:10 PM      Result Value Range Status   Specimen Description BLOOD RIGHT HAND   Final   Special Requests BOTTLES DRAWN AEROBIC ONLY 3CC   Final   Culture  Setup Time     Final   Value: 01/20/2013 01:46     Performed at [facility]   Culture     Final   Value:        BLOOD CULTURE RECEIVED NO GROWTH TO DATE CULTURE WILL BE HELD FOR 5 DAYS BEFORE ISSUING A FINAL NEGATIVE REPORT     Performed at [facility]   Report Status PENDING   Incomplete  URINE CULTURE     Status: None   Collection Time    01/20/13  1:36 AM      Result Value Range Status   Specimen Description URINE, CLEAN CATCH   Final   Special Requests NONE   Final   Culture  Setup Time     Final   Value: 01/20/2013 03:10     Performed at [facility] Count     Final   Value: 4,000 COLONIES/ML     Performed at [facility]   Culture     Final   Value: INSIGNIFICANT GROWTH     Performed at [facility]   Report Status 01/21/2013 FINAL   Final   Studies/Results: No results found. Medications: I have reviewed the patient's current medications. Scheduled Meds: . aspirin  81 mg Oral Daily  . carvedilol  25 mg Oral BID WC  . doxycycline  100 mg Oral Q12H  . heparin  5,000 Units Subcutaneous Q8H  . insulin aspart  0-9 Units Subcutaneous TID WC & HS  . insulin glargine  5 Units Subcutaneous QHS  . loratadine  10 mg Oral Daily  . pantoprazole  20 mg Oral Daily  . simvastatin  20 mg Oral Daily  . triamcinolone ointment  1  application Topical BID   Continuous Infusions: . sodium chloride 75 mL/hr at 01/23/13 1138   PRN Meds:.albuterol, morphine injection, ondansetron (ZOFRAN) IV, oxyCODONE Assessment/Plan: Principal Problem:   Abscess of buttock, right Active Problems:   DIABETES MELLITUS, TYPE II   HYPERLIPIDEMIA   HYPERTENSION   GERD   Meningioma   Chronic systolic congestive heart failure   UTI (urinary tract infection)  # Abscess-right buttock: Improved.  CT showed amorphous collection within the right gluteal region adjacent to the gluteal fold.  Surgery was consulted and I&D was performed. Patient tolerated the procedure well; wound now packed w/ gauze. Currently patient is hemodynamically stable and afebrile. Her pain is well controlled.  Wound culture +MRSA.  -doxycycline 100mg  bid  #AKI: Pt creatinine increased from 0.72 to 1.22 within 48 hours.  Etiology could be from volume depletion since pt had several episodes of emesis yesterday and/or nephrotoxicity  from vancomycin/zosyn.   Today, her creatinine has increased further.  Renal was consulted and felt contrast nephropathy.  Advised to stop lisinopril.   Lab Results  Component Value Date   CREATININE 2.47* 01/23/2013   -d/c lisinopril -IVF @75ml /hr -recheck BMET in AM   #Hypokalemia: Resolved.  K 3.2 on admission. Likely due to lasix therapy. Pt is not on KCl supplementation at home.  BMET this AM revealed a potassium of 4.0.  Lab Results  Component Value Date   K 3.9 01/23/2013   -check BMET in AM   # Bladder outlet obstruction: Stable.  CT scan showed possible filling defect in the posterior aspect of the urinary bladder and concern of chronic bladder outlet obstruction. Patient was recently treated for UTI with ciprofloxacin. Per patient's daughter, patient's dysuria resolved, but patient still has increased urinary frequency (1-2 weeks). Initial urine collection was not a clean catch. It is possible that patient has recurrent  UTI. Patient is being treated w/ zosyn for abscess, which should also treat a UTI if present. Patient symptoms might be related to the findings on CT scan.  Pt has no suprapubic tenderness so probably not necessary to treat UTI.  Pt should have urology referral as outpatient workup for possible chronic bladder outlet obstruction  -bladder scan yesterday revealed PVR of 54cc  # DM2: Last Hgb A1C of 12.1% on 12/23/12. Patient was supposed to be on Lantus 10 units at bedtime daily and metformin 1000 mg twice a day at home. It seems that patient may not have been completely compliant to this regimen. She is currently living with her daughter, who is not very sure whether patient is doing insulin injection correctly.   -Continue lantus 8 units qhs and SSI  CBG (last 3)   Recent Labs  01/23/13 0809 01/23/13 1211 01/23/13 1718  GLUCAP 124* 125* 152*    # HTN: Stable.  BP is 131/64 mmHg on admission. She is on lisinopril 40mg  daily and Coreg 25 mg at home.  -Continue home meds  # History of RLL adenocarcinoma: Stable. She is s/p right lower lobectomy 12/15/10. Her recent chest CT on 05/07/12 showed small left pleural effusion and no evidence of pleural effusion on the right. Patient was followed up by oncology, Dr. Arbutus Ped in the past. Patient has Not followed up with oncologist since 07/2011. Currently patient does not have cough, shortness of breath or chest pain. She is using albuterol inhaler prn at home.  -Continue Albuterol inhaler PRN   # Chronic sCHF: Stable. Echo on 06/2010 with EF of 15-20% and Myoview showing thinning of apex, however, myoview on 12/12/10 with improved EF of 40% with nonischemic CM. Patient is on Lasix 20 mg daily, aspirin, Coreg 25 mg twice a day, lisinopril 40 mg daily at home. Weight was 205lbs on 12/23/12-->207lbs today. Clinically patient is euvolemic without JVD and the leg edema and lungs CTAB, therefore no concern for acute exacerbation at this time.  Continue to hold  lasix given patient is at risk of developing sepsis.  IVF at 25ml/hr.  Continue BB, ASA, and statin.  Daily weights.   -d/c lisinopril per renal recommendations d/t AKI  # Meningioma: Patient's MRI on 10/2010 showed stable right frontal lobe meningioma compared with MRI on MRI 07/08/2010. Patient does not have an focal neurologic signs.  Pt should follow up as outpt.  #EKG changes: Stable.  EKG w/ ?J point elevation V2, V3, T wave inversion aVL. Patient w/o chest pain or SOB at this  time. Given patient's hx of CHF and DM, we wanted to evaluated further w/ one troponin which was negative.    #Unspecified rash: Pt has scattered hyperpigmented plaques w/ overlying adherent scale to bilateral lower extremities. -biopsy site without erythema, swelling, or warmth -punch biopsy pathology results pending   Dispo: Disposition is deferred at this time, awaiting improvement of current medical problems.  Anticipated discharge in approximately 1-2 day(s).   The patient does have a current PCP Darden Palmer, MD) and does need an [facility] hospital follow-up appointment after discharge.    .Services Needed at time of discharge: Y = Yes, Blank = No PT:   OT:   RN:   Equipment:   Other:     LOS: 4 days   Boykin Peek, MD 01/23/2013, 7:36 PM

## 2013-01-23 NOTE — Progress Notes (Signed)
Visit to patient while in hospital. Will call after patient is discharged to assist with transition of care. 

## 2013-01-23 NOTE — Progress Notes (Signed)
Seen and agreed 01/23/2013 Fredrich Birks PTA [phone] pager [phone] office

## 2013-01-24 DIAGNOSIS — N179 Acute kidney failure, unspecified: Secondary | ICD-10-CM | POA: Diagnosis not present

## 2013-01-24 DIAGNOSIS — L0231 Cutaneous abscess of buttock: Secondary | ICD-10-CM | POA: Diagnosis not present

## 2013-01-24 DIAGNOSIS — N059 Unspecified nephritic syndrome with unspecified morphologic changes: Secondary | ICD-10-CM | POA: Diagnosis not present

## 2013-01-24 DIAGNOSIS — A4902 Methicillin resistant Staphylococcus aureus infection, unspecified site: Secondary | ICD-10-CM | POA: Diagnosis not present

## 2013-01-24 DIAGNOSIS — E876 Hypokalemia: Secondary | ICD-10-CM | POA: Diagnosis not present

## 2013-01-24 LAB — BASIC METABOLIC PANEL
BUN: 20 mg/dL (ref 6–23)
CO2: 22 mEq/L (ref 19–32)
Calcium: 7.9 mg/dL — ABNORMAL LOW (ref 8.4–10.5)
Creatinine, Ser: 2.53 mg/dL — ABNORMAL HIGH (ref 0.50–1.10)
GFR calc non Af Amer: 17 mL/min — ABNORMAL LOW (ref >90)
Glucose, Bld: 128 mg/dL — ABNORMAL HIGH (ref 70–99)
Sodium: 143 mEq/L (ref 135–145)

## 2013-01-24 LAB — ANAEROBIC CULTURE

## 2013-01-24 LAB — GLUCOSE, CAPILLARY
Glucose-Capillary: 131 mg/dL — ABNORMAL HIGH (ref 70–99)
Glucose-Capillary: 145 mg/dL — ABNORMAL HIGH (ref 70–99)
Glucose-Capillary: 149 mg/dL — ABNORMAL HIGH (ref 70–99)

## 2013-01-24 MED ORDER — AMLODIPINE BESYLATE 10 MG PO TABS
10.0000 mg | ORAL_TABLET | Freq: Every day | ORAL | Status: DC
  Administered 2013-01-25 – 2013-01-27 (×3): 10 mg via ORAL
  Filled 2013-01-24 (×3): qty 1

## 2013-01-24 MED ORDER — AMLODIPINE BESYLATE 5 MG PO TABS: 5.0000 mg | ORAL_TABLET | Freq: Once | ORAL | Status: DC

## 2013-01-24 MED ORDER — AMLODIPINE BESYLATE 5 MG PO TABS
5.0000 mg | ORAL_TABLET | Freq: Every day | ORAL | Status: DC
  Administered 2013-01-24: 5 mg via ORAL
  Filled 2013-01-24: qty 1

## 2013-01-24 NOTE — Progress Notes (Addendum)
Subjective:  Pt denies any pain this AM and is eating well; no issues with N/V/D/C.  No fever/chills.  She is having a sitz bath.    Objective: Vital signs in last 24 hours: Filed Vitals:   01/23/13 1515 01/23/13 2159 01/24/13 0539 01/24/13 1448  BP: 158/66 147/56 186/79 185/72  Pulse: 66 68 71 76  Temp: 98.9 F (37.2 C) 98.8 F (37.1 C) 98.3 F (36.8 C) 97.2 F (36.2 C)  TempSrc: Oral Oral Oral Oral  Resp: 17 18 18 18   Height:      Weight:      SpO2: 99% 100% 100% 100%   Weight change:   Intake/Output Summary (Last 24 hours) at 01/24/13 1503 Last data filed at 01/24/13 0700  Gross per 24 hour  Intake 1211.25 ml  Output      0 ml  Net 1211.25 ml   Physical Exam:   Filed Vitals:   01/24/13 1448  BP: 185/72  Pulse: 76  Temp: 97.2 F (36.2 C)  Resp: 18   General: Elderly female in NAD.    HEENT: EMOI, no scleral icterus  Neck: supple Lungs: CTAB, normal work of respiration, no wheezes, rales, ronchi Heart: regular rate and rhythm, no murmurs, gallops, or rubs, DP 2+ b/l Abdomen: soft, non-tender, non-distended, normal bowel sounds; no suprapubic tenderness  Extremities: warm extremities b/l, no BLE edema  Skin: right thigh s/p punch biospy without erythema, swelling, warmth: open surgical incision approx 3-4cm in length packed w/ gauze to base of R buttock; gauze has minimal fluid; there are also scattered hyperpigmented plaques w/ small amount of overlying adherent scale to bilateral lower extremities  Neurologic: alert & oriented X3, cranial nerves II-XII grossly intact, strength grossly intact  Lab Results: Basic Metabolic Panel:  Recent Labs Lab 01/21/13 0420  01/23/13 0615 01/24/13 0510  NA 145  < > 142 143  K 3.2*  < > 3.9 3.9  CL 109  < > 109 111  CO2 28  < > 23 22  GLUCOSE 154*  < > 132* 128*  BUN 18  < > 20 20  CREATININE 1.22*  < > 2.47* 2.53*  CALCIUM 7.9*  < > 7.9* 7.9*  MG 1.9  --   --   --   PHOS 3.3  --   --   --   < > = values in  this interval not displayed. Liver Function Tests:  Recent Labs Lab 01/19/13 0925  AST 10  ALT 8  ALKPHOS 85  BILITOT 0.3  PROT 7.3  ALBUMIN 3.2*   No results found for this basename: LIPASE, AMYLASE,  in the last 168 hours No results found for this basename: AMMONIA,  in the last 168 hours CBC:  Recent Labs Lab 01/19/13 0925 01/20/13 0532 01/23/13 0820  WBC 13.4* 15.0* 8.2  NEUTROABS 10.1*  --   --   HGB 11.7* 10.6* 9.5*  HCT 36.0 34.7* 31.3*  MCV 78.4 80.3 80.7  PLT 306 292 302   Cardiac Enzymes:  Recent Labs Lab 01/19/13 2225  TROPONINI <0.30   BNP: No results found for this basename: PROBNP,  in the last 168 hours D-Dimer: No results found for this basename: DDIMER,  in the last 168 hours CBG:  Recent Labs Lab 01/23/13 0809 01/23/13 1211 01/23/13 1718 01/23/13 2211 01/24/13 0800 01/24/13 1202  GLUCAP 124* 125* 152* 136* 141* 131*   Drugs of Abuse     Component Value Date/Time   LABOPIA PPS  06/27/2012 1645   LABOPIA NEG 07/02/2006 2021   COCAINSCRNUR NEG 06/27/2012 1645   LABBENZ PPS 06/27/2012 1645   LABBENZ NEG 07/02/2006 2021   AMPHETMU NEG 07/02/2006 2021   LABBARB NEG 06/27/2012 1645    Urinalysis:  Recent Labs Lab 01/19/13 1012 01/20/13 0136  COLORURINE YELLOW YELLOW  LABSPEC 1.040* 1.030  PHURINE 5.0 5.0  GLUCOSEU >1000* 100*  HGBUR MODERATE* LARGE*  BILIRUBINUR NEGATIVE SMALL*  KETONESUR NEGATIVE 15*  PROTEINUR NEGATIVE 100*  UROBILINOGEN 0.2 1.0  NITRITE NEGATIVE NEGATIVE  LEUKOCYTESUR MODERATE* LARGE*    Micro Results: Recent Results (from the past 240 hour(s))  URINE CULTURE     Status: None   Collection Time    01/19/13 10:12 AM      Result Value Range Status   Specimen Description URINE, CLEAN CATCH   Final   Special Requests NONE   Final   Culture  Setup Time     Final   Value: 01/19/2013 12:14     Performed at [facility] Count     Final   Value: >=100,000 COLONIES/ML     Performed at [facility]   Culture     Final   Value: Multiple bacterial morphotypes present, none predominant. Suggest appropriate recollection if clinically indicated.     Performed at [facility]   Report Status 01/20/2013 FINAL   Final  CULTURE, ROUTINE-ABSCESS     Status: None   Collection Time    01/19/13  6:29 PM      Result Value Range Status   Specimen Description ABSCESS PERIRECTAL   Final   Special Requests PATIENT ON FOLLOWING ZOSYN   Final   Gram Stain     Final   Value: MODERATE WBC PRESENT,BOTH PMN AND MONONUCLEAR     NO SQUAMOUS EPITHELIAL CELLS SEEN     MODERATE GRAM POSITIVE COCCI IN CLUSTERS     Performed at [facility]   Culture     Final   Value: MODERATE METHICILLIN RESISTANT STAPHYLOCOCCUS AUREUS     Note: RIFAMPIN AND GENTAMICIN SHOULD NOT BE USED AS SINGLE DRUGS FOR TREATMENT OF STAPH INFECTIONS. This organism DOES NOT demonstrate inducible Clindamycin resistance in vitro. CRITICAL RESULT CALLED TO, READ BACK BY AND VERIFIED WITH: BONNIE GIBBS@[id number]       ON [id number] BY [facility]     Performed at [facility]   Report Status 01/22/2013 FINAL   Final   Organism ID, Bacteria METHICILLIN RESISTANT STAPHYLOCOCCUS AUREUS   Final  ANAEROBIC CULTURE     Status: None   Collection Time    01/19/13  6:29 PM      Result Value Range Status   Specimen Description ABSCESS PERIRECTAL   Final   Special Requests PATIENT ON FOLLOWING ZOSYN   Final   Gram Stain     Final   Value: MODERATE WBC PRESENT,BOTH PMN AND MONONUCLEAR     NO SQUAMOUS EPITHELIAL CELLS SEEN     MODERATE GRAM POSITIVE COCCI IN CLUSTERS     Performed at [facility]   Culture     Final   Value: NO ANAEROBES ISOLATED     Performed at [facility]   Report Status 01/24/2013 FINAL   Final  CULTURE, BLOOD (ROUTINE X 2)     Status: None   Collection Time    01/19/13  9:00 PM      Result Value Range Status   Specimen Description BLOOD LEFT HAND  Final   Special Requests BOTTLES  DRAWN AEROBIC ONLY 3CC   Final   Culture  Setup Time     Final   Value: 01/20/2013 01:46     Performed at [facility]   Culture     Final   Value:        BLOOD CULTURE RECEIVED NO GROWTH TO DATE CULTURE WILL BE HELD FOR 5 DAYS BEFORE ISSUING A FINAL NEGATIVE REPORT     Performed at [facility]   Report Status PENDING   Incomplete  CULTURE, BLOOD (ROUTINE X 2)     Status: None   Collection Time    01/19/13  9:10 PM      Result Value Range Status   Specimen Description BLOOD RIGHT HAND   Final   Special Requests BOTTLES DRAWN AEROBIC ONLY 3CC   Final   Culture  Setup Time     Final   Value: 01/20/2013 01:46     Performed at [facility]   Culture     Final   Value:        BLOOD CULTURE RECEIVED NO GROWTH TO DATE CULTURE WILL BE HELD FOR 5 DAYS BEFORE ISSUING A FINAL NEGATIVE REPORT     Performed at [facility]   Report Status PENDING   Incomplete  URINE CULTURE     Status: None   Collection Time    01/20/13  1:36 AM      Result Value Range Status   Specimen Description URINE, CLEAN CATCH   Final   Special Requests NONE   Final   Culture  Setup Time     Final   Value: 01/20/2013 03:10     Performed at [facility] Count     Final   Value: 4,000 COLONIES/ML     Performed at [facility]   Culture     Final   Value: INSIGNIFICANT GROWTH     Performed at [facility]   Report Status 01/21/2013 FINAL   Final   Studies/Results: No results found. Medications: I have reviewed the patient's current medications. Scheduled Meds: . amLODipine  5 mg Oral Daily  . aspirin  81 mg Oral Daily  . carvedilol  25 mg Oral BID WC  . doxycycline  100 mg Oral Q12H  . heparin  5,000 Units Subcutaneous Q8H  . insulin aspart  0-9 Units Subcutaneous TID WC & HS  . insulin glargine  5 Units Subcutaneous QHS  . loratadine  10 mg Oral Daily  . pantoprazole  20 mg Oral Daily  . simvastatin  20 mg Oral Daily  . triamcinolone  ointment  1 application Topical BID   Continuous Infusions: . sodium chloride 75 mL/hr (01/24/13 0109)   PRN Meds:.albuterol, morphine injection, ondansetron (ZOFRAN) IV, oxyCODONE Assessment/Plan: Principal Problem:   Abscess of buttock, right Active Problems:   DIABETES MELLITUS, TYPE II   HYPERLIPIDEMIA   HYPERTENSION   GERD   Meningioma   Chronic systolic congestive heart failure   UTI (urinary tract infection)  # Abscess-right buttock: Improved.  CT showed amorphous collection within the right gluteal region adjacent to the gluteal fold.  Surgery was consulted and I&D was performed. Patient tolerated the procedure well; wound now packed w/ gauze. Currently patient is hemodynamically stable and afebrile. Her pain is well controlled.  Wound culture +MRSA.    -doxycycline 100mg  bid  #AKI: Pt creatinine increased from 0.72 to 1.22 within 48 hours.  Etiology could be from volume depletion since pt had several episodes of emesis yesterday and/or nephrotoxicity from vancomycin/zosyn.   Today, her creatinine has increased further.  Renal was consulted and felt contrast nephropathy.  Advised to stop lisinopril.    Lab Results  Component Value Date   CREATININE 2.53* 01/24/2013   -IVF @75ml /hr -recheck BMET in AM -per renal possibly d/c tomorrow if creatinine is downtrending   #Hypokalemia: Resolved.  K 3.2 on admission. Likely due to lasix therapy. Pt is not on KCl supplementation at home.  BMET this AM revealed a potassium of 4.0.  Lab Results  Component Value Date   K 3.9 01/24/2013    # Bladder outlet obstruction: Stable.  CT scan showed possible filling defect in the posterior aspect of the urinary bladder and concern of chronic bladder outlet obstruction. Patient was recently treated for UTI with ciprofloxacin. Per patient's daughter, patient's dysuria resolved, but patient still has increased urinary frequency (1-2 weeks). Initial urine collection was not a clean catch. It is  possible that patient has recurrent UTI. Patient is being treated w/ zosyn for abscess, which should also treat a UTI if present. Patient symptoms might be related to the findings on CT scan.  Pt has no suprapubic tenderness so probably not necessary to treat UTI.  Pt should have urology referral as outpatient workup for possible chronic bladder outlet obstruction  -bladder scan yesterday revealed PVR of 54cc  # DM2: Last Hgb A1C of 12.1% on 12/23/12. Patient was supposed to be on Lantus 10 units at bedtime daily and metformin 1000 mg twice a day at home. It seems that patient may not have been completely compliant to this regimen. She is currently living with her daughter, who is not very sure whether patient is doing insulin injection correctly.   -Continue lantus 8 units qhs and SSI  CBG (last 3)   Recent Labs  01/23/13 2211 01/24/13 0800 01/24/13 1202  GLUCAP 136* 141* 131*    # HTN: Stable.  BP is 131/64 mmHg on admission. She is on lisinopril 40mg  daily and Coreg 25 mg at home.  We discontinued lisinopril d/t AKI and started amlodipine 5mg  daily.   -Continue to monitor  BP Readings from Last 3 Encounters:  01/24/13 185/72  01/24/13 185/72  12/23/12 130/80    # History of RLL adenocarcinoma: Stable. She is s/p right lower lobectomy 12/15/10. Her recent chest CT on 05/07/12 showed small left pleural effusion and no evidence of pleural effusion on the right. Patient was followed up by oncology, Dr. Arbutus Ped in the past. Patient has Not followed up with oncologist since 07/2011. Currently patient does not have cough, shortness of breath or chest pain. She is using albuterol inhaler prn at home.  -Continue Albuterol inhaler PRN   # Chronic sCHF: Stable. Echo on 06/2010 with EF of 15-20% and Myoview showing thinning of apex, however, myoview on 12/12/10 with improved EF of 40% with nonischemic CM. Patient is on Lasix 20 mg daily, aspirin, Coreg 25 mg twice a day, lisinopril 40 mg daily at  home. Weight was 205lbs on 12/23/12-->207lbs today. Clinically patient is euvolemic without JVD and the leg edema and lungs CTAB, therefore no concern for acute exacerbation at this time.  Continue to hold lasix given patient is at risk of developing sepsis.  IVF at 46ml/hr.  Continue BB, ASA, and statin.  Daily weights.   -d/c lisinopril per renal recommendations d/t AKI  # Meningioma: Patient's MRI on 10/2010 showed stable  right frontal lobe meningioma compared with MRI on MRI 07/08/2010. Patient does not have an focal neurologic signs.  Pt should follow up as outpt.  #EKG changes: Stable.  EKG w/ ?J point elevation V2, V3, T wave inversion aVL. Patient w/o chest pain or SOB at this time. Given patient's hx of CHF and DM, we wanted to evaluated further w/ one troponin which was negative.    #Unspecified rash: Pt has scattered hyperpigmented plaques w/ overlying adherent scale to bilateral lower extremities. -biopsy site without erythema, swelling, or warmth -punch biopsy pathology results pending   Dispo: Disposition is deferred at this time, awaiting improvement of AKI.  Anticipated discharge in approximately 1-2 day(s).   The patient does have a current PCP Darden Palmer, MD) and does need an [facility] hospital follow-up appointment after discharge.    .Services Needed at time of discharge: Y = Yes, Blank = No PT:   OT:   RN:   Equipment:   Other:     LOS: 5 days   Boykin Peek, MD 01/24/2013, 3:03 PM

## 2013-01-24 NOTE — Progress Notes (Signed)
Occupational Therapy Treatment Patient Details Name: [name] MRN: [id number] DOB: [birth year] Today's Date: 01/24/2013 Time: 4098-1191 OT Time Calculation (min): 24 min  OT Assessment / Plan / Recommendation  History of present illness Patient reports having onset of pain to R lower buttock since yesterday. She did not notice it draining any fluid nor has she had F/C, night sweats, change in appetite, malaise. Abscess R buttock - surgery took pt to OR for I&D   OT comments  This 79 yo making progress and I have now confirmed that pt will have someone with her 24/7 at home. Will continue to benefit from OT to get back to PLOF.  Follow Up Recommendations  Home health OT;Supervision/Assistance - 24 hour       Equipment Recommendations  3 in 1 bedside comode       Frequency Min 2X/week   Progress towards OT Goals Progress towards OT goals: Progressing toward goals  Plan Discharge plan remains appropriate    Precautions / Restrictions Precautions Precautions: Fall Restrictions Weight Bearing Restrictions: No       ADL  Lower Body Dressing: Moderate assistance (with AE) Where Assessed - Lower Body Dressing: Supported sit to Pharmacist, hospital: Hydrographic surveyor Method: Sit to Barista: Materials engineer and Hygiene: Moderate assistance (can do front, needs A for back due to wound) Where Assessed - Toileting Clothing Manipulation and Hygiene: Sit to stand from 3-in-1 or toilet Transfers/Ambulation Related to ADLs: min guard A for all with RW ADL Comments: I spoke to pt's daughter whom lives with her and made her aware that the patient will need A for back peri-hygiene care due to her wound and that we are recommending someone be with her all the time (daughter says she is with her all the time)      OT Goals(current goals can now be found in the care plan section)    Visit Information  Last OT Received On:  01/24/13 Assistance Needed: +1 History of Present Illness: Patient reports having onset of pain to R lower buttock since yesterday. She did not notice it draining any fluid nor has she had F/C, night sweats, change in appetite, malaise. Abscess R buttock - surgery took pt to OR for I&D          Cognition  Cognition Arousal/Alertness: Awake/alert Behavior During Therapy: WFL for tasks assessed/performed Overall Cognitive Status: Impaired/Different from baseline Area of Impairment: Safety/judgement Safety/Judgement: Decreased awareness of safety (for safe hand placement with sit<>stand)    Mobility  Bed Mobility Bed Mobility: Supine to Sit;Sitting - Scoot to Edge of Bed Supine to Sit: 6: Modified independent (Device/Increase time);With rails;HOB elevated (20 degrees) Sitting - Scoot to Edge of Bed: 6: Modified independent (Device/Increase time);With rail Transfers Transfers: Sit to Stand;Stand to Sit Sit to Stand: 4: Min guard;With upper extremity assist;From bed Stand to Sit: 4: Min guard;With upper extremity assist;With armrests;To chair/3-in-1 Details for Transfer Assistance: VCs for safe hand placement          End of Session OT - End of Session Equipment Utilized During Treatment: Rolling walker Activity Tolerance: Patient tolerated treatment well Patient left: in chair;with call bell/phone within reach;with chair alarm set       Evette Georges [phone] 01/24/2013, 11:55 AM

## 2013-01-24 NOTE — Progress Notes (Signed)
General Surgery [facility]  Wound care going well with clean wound and dressing changes.  Patient to see Dr. Janee Morn at [facility] office in follow up.  Will sign off - call if needed.  Velora Heckler, MD, [facility] Office: [phone]

## 2013-01-24 NOTE — Progress Notes (Signed)
Notified Md regarding patient's blood pressure 178/68, new order received.

## 2013-01-24 NOTE — Progress Notes (Signed)
  Date: 01/24/2013  Patient name: [name]  Medical record number: [id number]  Date of birth: [birth year]   This patient has been seen and the plan of care was discussed with the house staff. Please see their note for complete details. I concur with their findings with the following additions/corrections;   [name] has no complaints today and understands the need to follow her Cr. It is still increasing but less steeply. Cont IVF and follow Cr.  [name], MD 01/24/2013, 10:11 AM

## 2013-01-24 NOTE — Progress Notes (Signed)
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    Subjective:  NAEON Sitting in chair Eating and drinking   12/08 0701 - 12/09 0700 In: 1885.3 [I.V.:1885.3] Out: -   Filed Weights   01/21/13 0448 01/22/13 0521 01/23/13 0500  Weight: 91.218 kg (201 lb 1.6 oz) 92.307 kg (203 lb 8 oz) 94.756 kg (208 lb 14.4 oz)    Current meds: reviewed  Current Labs: reviewed   Physical Exam:  Blood pressure 186/79, pulse 71, temperature 98.3 F (36.8 C), temperature source Oral, resp. rate 18, height 5\' 3"  (1.6 m), weight 94.756 kg (208 lb 14.4 oz), SpO2 100.00%. GEN: NAD, dozing in chair ENT: NCAT  EYES: EOMI, muddy sclera  CV: RRR, nl s1s2  PULM: CTAB,nl wob  ABD: s/nt/nd +BS  EXT: 1+ pitting edema   Assessment/Plan 1. AKI 2/2 CIN: SCr plateau'd and K, HCO3, Vol/BP ok.  Not uremic.  I expect this is the beginning of her recovery.  No changes to plan.  Potentially home tomorrow if SCr downtrending.    Sabra Heck MD 01/24/2013, 1:54 PM   Recent Labs Lab 01/21/13 0420 01/22/13 0435 01/23/13 0615 01/24/13 0510  NA 145 142 142 143  K 3.2* 4.0 3.9 3.9  CL 109 107 109 111  CO2 28 25 23 22   GLUCOSE 154* 152* 132* 128*  BUN 18 19 20 20   CREATININE 1.22* 1.78* 2.47* 2.53*  CALCIUM 7.9* 7.9* 7.9* 7.9*  PHOS 3.3  --   --   --     Recent Labs Lab 01/19/13 0925 01/20/13 0532 01/23/13 0820  WBC 13.4* 15.0* 8.2  NEUTROABS 10.1*  --   --   HGB 11.7* 10.6* 9.5*  HCT 36.0 34.7* 31.3*  MCV 78.4 80.3 80.7  PLT 306 292 302    Current Facility-Administered Medications  Medication Dose Route Frequency Provider Last Rate Last Dose  . 0.9 %  sodium chloride infusion   Intravenous Continuous Boykin Peek, MD 75 mL/hr at 01/24/13 0109 75 mL/hr at 01/24/13 0109  . albuterol (PROVENTIL HFA;VENTOLIN HFA) 108 (90 BASE) MCG/ACT inhaler 2 puff  2 puff Inhalation Q4H PRN Lorretta Harp, MD      . amLODipine (NORVASC) tablet 5 mg  5 mg Oral Daily Boykin Peek, MD   5  mg at 01/24/13 1040  . aspirin chewable tablet 81 mg  81 mg Oral Daily Lorretta Harp, MD   81 mg at 01/24/13 1039  . carvedilol (COREG) tablet 25 mg  25 mg Oral BID WC Lorretta Harp, MD   25 mg at 01/24/13 4098  . doxycycline (VIBRA-TABS) tablet 100 mg  100 mg Oral Q12H Boykin Peek, MD   100 mg at 01/24/13 1039  . heparin injection 5,000 Units  5,000 Units Subcutaneous Q8H Lorretta Harp, MD   5,000 Units at 01/24/13 0620  . insulin aspart (novoLOG) injection 0-9 Units  0-9 Units Subcutaneous TID WC & HS Carlynn Purl, DO   1 Units at 01/24/13 1253  . insulin glargine (LANTUS) injection 5 Units  5 Units Subcutaneous QHS Boykin Peek, MD   5 Units at 01/23/13 2237  . loratadine (CLARITIN) tablet 10 mg  10 mg Oral Daily Lorretta Harp, MD   10 mg at 01/24/13 1039  . morphine 2 MG/ML injection 2 mg  2 mg Intravenous Q2H PRN Liz Malady, MD      . ondansetron [facility]) injection 4 mg  4 mg Intravenous Q8H PRN Boykin Peek, MD      .  oxyCODONE (Oxy IR/ROXICODONE) immediate release tablet 5-10 mg  5-10 mg Oral Q4H PRN Liz Malady, MD   5 mg at 01/24/13 1108  . pantoprazole (PROTONIX) EC tablet 20 mg  20 mg Oral Daily Lorretta Harp, MD   20 mg at 01/24/13 1039  . simvastatin (ZOCOR) tablet 20 mg  20 mg Oral Daily Lorretta Harp, MD   20 mg at 01/24/13 1040  . triamcinolone ointment (KENALOG) 0.1 % 1 application  1 application Topical BID Lorretta Harp, MD   1 application at 01/24/13 1040

## 2013-01-24 NOTE — Progress Notes (Signed)
5 Days Post-Op  Subjective: Patient doing well, dressing changes going well.  Wound clean.  Patient up to chair, working with PT/OT.  Having BM's.   Objective: Vital signs in last 24 hours: Temp:  [98.3 F (36.8 C)-98.9 F (37.2 C)] 98.3 F (36.8 C) (12/09 0539) Pulse Rate:  [66-71] 71 (12/09 0539) Resp:  [17-18] 18 (12/09 0539) BP: (147-186)/(56-79) 186/79 mmHg (12/09 0539) SpO2:  [99 %-100 %] 100 % (12/09 0539) Last BM Date: 01/23/13  Intake/Output from previous day: 12/08 0701 - 12/09 0700 In: 1885.3 [I.V.:1885.3] Out: -  Intake/Output this shift:    PE: Gen:  Alert, NAD, pleasant Perirectal: Skin normal flesh colored, no erythema induration or fluctuance, wound bed is pink with good granulation tissue, no necrotic tissue and drainage is serosanguinous   Lab Results:   Recent Labs  01/23/13 0820  WBC 8.2  HGB 9.5*  HCT 31.3*  PLT 302   BMET  Recent Labs  01/23/13 0615 01/24/13 0510  NA 142 143  K 3.9 3.9  CL 109 111  CO2 23 22  GLUCOSE 132* 128*  BUN 20 20  CREATININE 2.47* 2.53*  CALCIUM 7.9* 7.9*   PT/INR No results found for this basename: LABPROT, INR,  in the last 72 hours CMP     Component Value Date/Time   NA 143 01/24/2013 0510   NA 146* 07/20/2011 1527   K 3.9 01/24/2013 0510   K 4.2 07/20/2011 1527   CL 111 01/24/2013 0510   CL 105 07/20/2011 1527   CO2 22 01/24/2013 0510   CO2 30 07/20/2011 1527   GLUCOSE 128* 01/24/2013 0510   GLUCOSE 129* 07/20/2011 1527   BUN 20 01/24/2013 0510   BUN 14 07/20/2011 1527   CREATININE 2.53* 01/24/2013 0510   CREATININE 0.66 05/06/2012 1555   CALCIUM 7.9* 01/24/2013 0510   CALCIUM 8.6 07/20/2011 1527   PROT 7.3 01/19/2013 0925   PROT 7.5 07/20/2011 1527   ALBUMIN 3.2* 01/19/2013 0925   AST 10 01/19/2013 0925   AST 20 07/20/2011 1527   ALT 8 01/19/2013 0925   ALT 24 07/20/2011 1527   ALKPHOS 85 01/19/2013 0925   ALKPHOS 63 07/20/2011 1527   BILITOT 0.3 01/19/2013 0925   BILITOT 0.50 07/20/2011 1527   GFRNONAA 17* 01/24/2013  0510   GFRAA 20* 01/24/2013 0510   Lipase     Component Value Date/Time   LIPASE 21 11/09/2010 2120       Studies/Results: No results found.  Anti-infectives: Anti-infectives   Start     Dose/Rate Route Frequency Ordered Stop   01/22/13 2100  doxycycline (VIBRA-TABS) tablet 100 mg     100 mg Oral Every 12 hours 01/22/13 1158     01/21/13 2200  sulfamethoxazole-trimethoprim (BACTRIM DS) 800-160 MG per tablet 2 tablet  Status:  Discontinued     2 tablet Oral Every 12 hours 01/21/13 1250 01/21/13 1358   01/21/13 2200  sulfamethoxazole-trimethoprim (BACTRIM DS) 800-160 MG per tablet 1 tablet  Status:  Discontinued     1 tablet Oral Every 12 hours 01/21/13 1358 01/22/13 1155   01/21/13 1200  vancomycin (VANCOCIN) IVPB 750 mg/150 ml premix  Status:  Discontinued     750 mg 150 mL/hr over 60 Minutes Intravenous Every 12 hours 01/21/13 0732 01/21/13 1240   01/20/13 0000  vancomycin (VANCOCIN) 1,250 mg in sodium chloride 0.9 % 250 mL IVPB  Status:  Discontinued     1,250 mg 166.7 mL/hr over 90 Minutes Intravenous Every 12  hours 01/19/13 2308 01/21/13 0732   01/19/13 1200  piperacillin-tazobactam (ZOSYN) IVPB 3.375 g  Status:  Discontinued     3.375 g 12.5 mL/hr over 240 Minutes Intravenous Every 8 hours 01/19/13 1110 01/21/13 1352   01/19/13 1200  vancomycin (VANCOCIN) IVPB 1000 mg/200 mL premix  Status:  Discontinued     1,000 mg 200 mL/hr over 60 Minutes Intravenous Every 12 hours 01/19/13 1110 01/19/13 1459       Assessment/Plan POD #5 s/p I&D perirectal abscess  MRSA positive culture was on zosyn/vanc 2/2 days, now on Doxycycline Day 3/? (sensitive to tetracycline)  Elevated Creatinine 2.53 today  1. Continue with doxycycline for 7-10 day course depending on improvement  2. Wound is very clean, WBC today is normal, add sitz baths now that patient is more ambulatory TID and prn with dressing changes 3. Ambulate and IS  4. SCD's and heparin  5. Okay for discharge when medically  stable with HH services (WD packing dressing changes BID)  6. F/u with Dr. Janee Morn in 2-3 weeks for post op check (office should call), will sign off - call with questions/concerns     LOS: 5 days    DORT, [facility] 01/24/2013, 8:13 AM Pager: [phone]

## 2013-01-24 NOTE — Progress Notes (Signed)
Physical Therapy Treatment Patient Details Name: [name] MRN: [id number] DOB: Dec 19, 1933 Today's Date: 01/24/2013 Time: 7322-0254 PT Time Calculation (min): 26 min  PT Assessment / Plan / Recommendation  History of Present Illness Patient reports having onset of pain to R lower buttock since yesterday. She did not notice it draining any fluid nor has she had F/C, night sweats, change in appetite, malaise. Abscess R buttock - surgery took pt to OR for I&D   PT Comments   Pt required max encouragement to participate in therapy session today. Pt was to begin stair training today, however pt refused all OOB activity. Stair training to be postponed until next session. Pt avoided eye contact and did not speak much to therapist, even when asked direct questions. Because of this, therapeutic exercise was the focus of today's session.   Follow Up Recommendations  Home health PT     Does the patient have the potential to tolerate intense rehabilitation     Barriers to Discharge        Equipment Recommendations  3in1 (PT)    Recommendations for Other Services    Frequency Min 3X/week   Progress towards PT Goals Progress towards PT goals: Progressing toward goals  Plan Current plan remains appropriate    Precautions / Restrictions Precautions Precautions: Fall Restrictions Weight Bearing Restrictions: No   Pertinent Vitals/Pain Pt did not answer when asked if she was having any pain.    Mobility  Bed Mobility Bed Mobility: Not assessed Details for Bed Mobility Assistance: Pt refused OOB activity at this time. Transfers Transfers: Not assessed Details for Transfer Assistance: Pt refused OOB activity at this time.  Ambulation/Gait Ambulation/Gait Assistance: Not tested (comment) Ambulation/Gait Assistance Details: Pt refused OOB activity at this time.    Exercises General Exercises - Lower Extremity Ankle Circles/Pumps: 10 reps Quad Sets: 10 reps Short Arc Quad: 10 reps Heel  Slides: 10 reps Hip ABduction/ADduction: 10 reps Straight Leg Raises: 10 reps   PT Diagnosis:    PT Problem List:   PT Treatment Interventions:     PT Goals (current goals can now be found in the care plan section) Acute Rehab PT Goals Patient Stated Goal: wants to go home PT Goal Formulation: With patient Time For Goal Achievement: 01/29/13 Potential to Achieve Goals: Good  Visit Information  Last PT Received On: 01/24/13 Assistance Needed: +1 History of Present Illness: Patient reports having onset of pain to R lower buttock since yesterday. She did not notice it draining any fluid nor has she had F/C, night sweats, change in appetite, malaise. Abscess R buttock - surgery took pt to OR for I&D    Subjective Data  Subjective: Pt very distant during session, refusing to look at, or often speak to, therapist. Patient Stated Goal: wants to go home   Cognition  Cognition Arousal/Alertness: Awake/alert Behavior During Therapy: WFL for tasks assessed/performed Overall Cognitive Status: Impaired/Different from baseline    Balance     End of Session PT - End of Session Equipment Utilized During Treatment: Gait belt Activity Tolerance: Patient tolerated treatment well Patient left: with call bell/phone within reach;in bed;with family/visitor present Nurse Communication: Mobility status   GP     Ruthann Cancer 01/24/2013, 4:05 PM  Ruthann Cancer, PT, DPT [phone]

## 2013-01-25 DIAGNOSIS — E876 Hypokalemia: Secondary | ICD-10-CM | POA: Diagnosis not present

## 2013-01-25 DIAGNOSIS — A4902 Methicillin resistant Staphylococcus aureus infection, unspecified site: Secondary | ICD-10-CM | POA: Diagnosis not present

## 2013-01-25 DIAGNOSIS — L0231 Cutaneous abscess of buttock: Secondary | ICD-10-CM | POA: Diagnosis not present

## 2013-01-25 DIAGNOSIS — N179 Acute kidney failure, unspecified: Secondary | ICD-10-CM | POA: Diagnosis not present

## 2013-01-25 DIAGNOSIS — N059 Unspecified nephritic syndrome with unspecified morphologic changes: Secondary | ICD-10-CM | POA: Diagnosis not present

## 2013-01-25 LAB — BASIC METABOLIC PANEL WITH GFR
BUN: 20 mg/dL (ref 6–23)
CO2: 22 meq/L (ref 19–32)
Calcium: 8 mg/dL — ABNORMAL LOW (ref 8.4–10.5)
Chloride: 117 meq/L — ABNORMAL HIGH (ref 96–112)
Creatinine, Ser: 2.69 mg/dL — ABNORMAL HIGH (ref 0.50–1.10)
GFR calc Af Amer: 19 mL/min — ABNORMAL LOW
GFR calc non Af Amer: 16 mL/min — ABNORMAL LOW
Glucose, Bld: 153 mg/dL — ABNORMAL HIGH (ref 70–99)
Potassium: 4.1 meq/L (ref 3.5–5.1)
Sodium: 145 meq/L (ref 135–145)

## 2013-01-25 LAB — GLUCOSE, CAPILLARY
Glucose-Capillary: 122 mg/dL — ABNORMAL HIGH (ref 70–99)
Glucose-Capillary: 122 mg/dL — ABNORMAL HIGH (ref 70–99)

## 2013-01-25 NOTE — Progress Notes (Signed)
  Date: 01/25/2013  Patient name: Kennethia Lynes  Medical record number: [id number]  Date of birth: Apr 19, 1933   This patient has been seen and the plan of care was discussed with the house staff. Please see their note for complete details. I concur with their findings with the following additions/corrections: Cr cont to rise. Cont IVF. BP up now off ACEI. May use prn if gets too high as norvasc and BB already at max.  Steroid cream to legs - path spongiform dermatitis.  [name], MD 01/25/2013, 8:18 PM

## 2013-01-25 NOTE — Progress Notes (Signed)
MD made aware of elevated BP, no new order given. Will continue to monitor.

## 2013-01-25 NOTE — Progress Notes (Signed)
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    Subjective:  NAEON Sitting in chair Eating and drinking  About to go for a walk No SOB/CP/LEE  12/09 0701 - 12/10 0700 In: 1725 [I.V.:1725] Out: -   Filed Weights   01/22/13 0521 01/23/13 0500 01/25/13 0532  Weight: 92.307 kg (203 lb 8 oz) 94.756 kg (208 lb 14.4 oz) 97.387 kg (214 lb 11.2 oz)    Current meds: reviewed  Current Labs: reviewed   Physical Exam:  Blood pressure 173/66, pulse 71, temperature 98.7 F (37.1 C), temperature source Oral, resp. rate 20, height 5\' 3"  (1.6 m), weight 97.387 kg (214 lb 11.2 oz), SpO2 98.00%. GEN: NAD, dozing in chair ENT: NCAT  EYES: EOMI, muddy sclera  CV: RRR, nl s1s2  PULM: CTAB,nl wob  ABD: s/nt/nd +BS  EXT: 1+ pitting edema   Assessment/Plan 1. AKI 2/2 CIN: SCr increased over past 24h, slightly.  K, HCO3, Vol/BP ok.  Not uremic.  Continue to follow daily for now.  No changes to plan.  Potentially home tomorrow if SCr downtrending.  BP up, d/c IVFs. Seems to be eating and drinking reasonably well.    Sabra Heck MD 01/25/2013, 1:41 PM   Recent Labs Lab 01/21/13 0420  01/23/13 0615 01/24/13 0510 01/25/13 0640  NA 145  < > 142 143 145  K 3.2*  < > 3.9 3.9 4.1  CL 109  < > 109 111 117*  CO2 28  < > 23 22 22   GLUCOSE 154*  < > 132* 128* 153*  BUN 18  < > 20 20 20   CREATININE 1.22*  < > 2.47* 2.53* 2.69*  CALCIUM 7.9*  < > 7.9* 7.9* 8.0*  PHOS 3.3  --   --   --   --   < > = values in this interval not displayed.  Recent Labs Lab 01/19/13 0925 01/20/13 0532 01/23/13 0820  WBC 13.4* 15.0* 8.2  NEUTROABS 10.1*  --   --   HGB 11.7* 10.6* 9.5*  HCT 36.0 34.7* 31.3*  MCV 78.4 80.3 80.7  PLT 306 292 302    Current Facility-Administered Medications  Medication Dose Route Frequency Provider Last Rate Last Dose  . 0.9 %  sodium chloride infusion   Intravenous Continuous Boykin Peek, MD 75 mL/hr at 01/25/13 0801    . albuterol  (PROVENTIL HFA;VENTOLIN HFA) 108 (90 BASE) MCG/ACT inhaler 2 puff  2 puff Inhalation Q4H PRN Lorretta Harp, MD      . amLODipine (NORVASC) tablet 10 mg  10 mg Oral Daily Windell Hummingbird, MD   10 mg at 01/25/13 0941  . amLODipine (NORVASC) tablet 5 mg  5 mg Oral Once Windell Hummingbird, MD      . aspirin chewable tablet 81 mg  81 mg Oral Daily Lorretta Harp, MD   81 mg at 01/25/13 0941  . carvedilol (COREG) tablet 25 mg  25 mg Oral BID WC Lorretta Harp, MD   25 mg at 01/25/13 0800  . doxycycline (VIBRA-TABS) tablet 100 mg  100 mg Oral Q12H Boykin Peek, MD   100 mg at 01/25/13 0941  . heparin injection 5,000 Units  5,000 Units Subcutaneous Q8H Lorretta Harp, MD   5,000 Units at 01/25/13 0559  . insulin aspart (novoLOG) injection 0-9 Units  0-9 Units Subcutaneous TID WC & HS Carlynn Purl, DO   1 Units at 01/25/13 1317  . insulin glargine (LANTUS) injection 5 Units  5  Units Subcutaneous QHS Boykin Peek, MD   5 Units at 01/24/13 2158  . loratadine (CLARITIN) tablet 10 mg  10 mg Oral Daily Lorretta Harp, MD   10 mg at 01/25/13 0940  . morphine 2 MG/ML injection 2 mg  2 mg Intravenous Q2H PRN Liz Malady, MD      . ondansetron [facility]) injection 4 mg  4 mg Intravenous Q8H PRN Boykin Peek, MD   4 mg at 01/25/13 1019  . oxyCODONE (Oxy IR/ROXICODONE) immediate release tablet 5-10 mg  5-10 mg Oral Q4H PRN Liz Malady, MD   10 mg at 01/24/13 2158  . pantoprazole (PROTONIX) EC tablet 20 mg  20 mg Oral Daily Lorretta Harp, MD   20 mg at 01/25/13 0941  . simvastatin (ZOCOR) tablet 20 mg  20 mg Oral Daily Lorretta Harp, MD   20 mg at 01/25/13 0941  . triamcinolone ointment (KENALOG) 0.1 % 1 application  1 application Topical BID Lorretta Harp, MD   1 application at 01/24/13 2200

## 2013-01-25 NOTE — Progress Notes (Signed)
Physical Therapy Treatment Patient Details Name: [name] MRN: [id number] DOB: 20-Feb-1933 Today's Date: 01/25/2013 Time: 4098-1191 PT Time Calculation (min): 30 min  PT Assessment / Plan / Recommendation  History of Present Illness Patient reports having onset of pain to R lower buttock since yesterday. She did not notice it draining any fluid nor has she had F/C, night sweats, change in appetite, malaise. Abscess R buttock - surgery took pt to OR for I&D   PT Comments   Pt incontinent upon arrival and again upon standing.  Pt required motivation to ambulate from 3n1 to room sink and to chair however agreeable. Pt educated on benefits of ambulation and motivated to ambulate with nursing staff. Pt declined stair training however recommended that pt complete prior to d/c home. Pt will benefit from HHPT to maximize functional I at home.  Follow Up Recommendations  Home health PT     Does the patient have the potential to tolerate intense rehabilitation     Barriers to Discharge        Equipment Recommendations  3in1 (PT)    Recommendations for Other Services    Frequency Min 3X/week   Progress towards PT Goals Progress towards PT goals: Progressing toward goals  Plan Current plan remains appropriate    Precautions / Restrictions Precautions Precautions: Fall Restrictions Weight Bearing Restrictions: No   Pertinent Vitals/Pain no apparent distress     Mobility  Bed Mobility Bed Mobility: Rolling Left;Left Sidelying to Sit;Sitting - Scoot to Edge of Bed Rolling Left: 5: Supervision Left Sidelying to Sit: 5: Supervision Sitting - Scoot to Edge of Bed: 5: Supervision Details for Bed Mobility Assistance: pt demonstrated without rail/HOB to simulate home environment; cues for technique Transfers Transfers: Sit to Stand;Stand to Sit Sit to Stand: 5: Supervision;From bed;With upper extremity assist;From chair/3-in-1 Stand to Sit: 5: Supervision;To chair/3-in-1;With upper  extremity assist Details for Transfer Assistance: vc's for hand placement and overall technique Ambulation/Gait Ambulation/Gait Assistance: 4: Min guard Ambulation Distance (Feet): 20 Feet Assistive device: Rolling walker Ambulation/Gait Assistance Details: Pt agreeable today to get OOB to 3n1, wash hands, and ambulate to recliner; cues for safety in confined spaces with RW Gait Pattern: Step-through pattern;Decreased stride length Gait velocity: decreased    Exercises     PT Diagnosis:    PT Problem List:   PT Treatment Interventions:     PT Goals (current goals can now be found in the care plan section)    Visit Information  Last PT Received On: 01/25/13 Assistance Needed: +1 History of Present Illness: Patient reports having onset of pain to R lower buttock since yesterday. She did not notice it draining any fluid nor has she had F/C, night sweats, change in appetite, malaise. Abscess R buttock - surgery took pt to OR for I&D    Subjective Data      Cognition  Cognition Arousal/Alertness: Awake/alert Behavior During Therapy: WFL for tasks assessed/performed Overall Cognitive Status: Within Functional Limits for tasks assessed    Balance  Balance Balance Assessed: Yes Static Standing Balance Static Standing - Balance Support: During functional activity Static Standing - Level of Assistance: 5: Stand by assistance Static Standing - Comment/# of Minutes: x2 minutes; pt leaning into sink for balance despite cues Dynamic Standing Balance Dynamic Standing - Balance Support: No upper extremity supported;During functional activity Dynamic Standing - Level of Assistance: 5: Stand by assistance Dynamic Standing - Balance Activities: Reaching for objects Dynamic Standing - Comments: unsteady without support   End of Session  PT - End of Session Equipment Utilized During Treatment: Gait belt Activity Tolerance: Patient tolerated treatment well Patient left: in chair;with call  bell/phone within reach;with chair alarm set Nurse Communication: Mobility status   GP     Ernestina Columbia, SPTA 01/25/2013, 2:13 PM

## 2013-01-25 NOTE — Progress Notes (Signed)
Pt tolerated sitz bath without incident.Ilean Skill LPN

## 2013-01-25 NOTE — Progress Notes (Signed)
Subjective:  Pt denies any pain this AM except when her wound is packed.  She is eating well; no issues with N/V/D/C.  No fever/chills.     Objective: Vital signs in last 24 hours: Filed Vitals:   01/24/13 2149 01/24/13 2345 01/25/13 0508 01/25/13 0532  BP: 176/68 175/68 173/66   Pulse: 78  71   Temp: 98.5 F (36.9 C)  98.7 F (37.1 C)   TempSrc: Oral  Oral   Resp: 20  20   Height:      Weight:    97.387 kg (214 lb 11.2 oz)  SpO2: 100%  98%    Weight change:   Intake/Output Summary (Last 24 hours) at 01/25/13 1323 Last data filed at 01/25/13 7846  Gross per 24 hour  Intake   1965 ml  Output      0 ml  Net   1965 ml   Physical Exam:   Filed Vitals:   01/25/13 0508  BP: 173/66  Pulse: 71  Temp: 98.7 F (37.1 C)  Resp: 20   General: Elderly female in NAD.    HEENT: EMOI, no scleral icterus  Neck: supple Lungs: CTAB, normal work of respiration, no wheezes, rales, ronchi Heart: regular rate and rhythm, no murmurs, gallops, or rubs, DP 2+ b/l Abdomen: soft, non-tender, non-distended, normal bowel sounds; no suprapubic tenderness  Extremities: warm extremities b/l, no BLE edema  Skin: right thigh s/p punch biospy without erythema, swelling, warmth: open surgical incision approx 3-4cm in length packed w/ gauze to base of R buttock; gauze has minimal fluid; there are also scattered hyperpigmented plaques w/ small amount of overlying adherent scale to bilateral lower extremities  Neurologic: alert & oriented X3, cranial nerves II-XII grossly intact, strength grossly intact  Lab Results: Basic Metabolic Panel:  Recent Labs Lab 01/21/13 0420  01/24/13 0510 01/25/13 0640  NA 145  < > 143 145  K 3.2*  < > 3.9 4.1  CL 109  < > 111 117*  CO2 28  < > 22 22  GLUCOSE 154*  < > 128* 153*  BUN 18  < > 20 20  CREATININE 1.22*  < > 2.53* 2.69*  CALCIUM 7.9*  < > 7.9* 8.0*  MG 1.9  --   --   --   PHOS 3.3  --   --   --   < > = values in this interval not  displayed. Liver Function Tests:  Recent Labs Lab 01/19/13 0925  AST 10  ALT 8  ALKPHOS 85  BILITOT 0.3  PROT 7.3  ALBUMIN 3.2*   No results found for this basename: LIPASE, AMYLASE,  in the last 168 hours No results found for this basename: AMMONIA,  in the last 168 hours CBC:  Recent Labs Lab 01/19/13 0925 01/20/13 0532 01/23/13 0820  WBC 13.4* 15.0* 8.2  NEUTROABS 10.1*  --   --   HGB 11.7* 10.6* 9.5*  HCT 36.0 34.7* 31.3*  MCV 78.4 80.3 80.7  PLT 306 292 302   Cardiac Enzymes:  Recent Labs Lab 01/19/13 2225  TROPONINI <0.30   BNP: No results found for this basename: PROBNP,  in the last 168 hours D-Dimer: No results found for this basename: DDIMER,  in the last 168 hours CBG:  Recent Labs Lab 01/24/13 0800 01/24/13 1202 01/24/13 1708 01/24/13 2150 01/25/13 0755 01/25/13 1156  GLUCAP 141* 131* 145* 149* 122* 143*   Drugs of Abuse     Component Value Date/Time  LABOPIA PPS 06/27/2012 1645   LABOPIA NEG 07/02/2006 2021   COCAINSCRNUR NEG 06/27/2012 1645   LABBENZ PPS 06/27/2012 1645   LABBENZ NEG 07/02/2006 2021   AMPHETMU NEG 07/02/2006 2021   LABBARB NEG 06/27/2012 1645    Urinalysis:  Recent Labs Lab 01/19/13 1012 01/20/13 0136  COLORURINE YELLOW YELLOW  LABSPEC 1.040* 1.030  PHURINE 5.0 5.0  GLUCOSEU >1000* 100*  HGBUR MODERATE* LARGE*  BILIRUBINUR NEGATIVE SMALL*  KETONESUR NEGATIVE 15*  PROTEINUR NEGATIVE 100*  UROBILINOGEN 0.2 1.0  NITRITE NEGATIVE NEGATIVE  LEUKOCYTESUR MODERATE* LARGE*    Micro Results: Recent Results (from the past 240 hour(s))  URINE CULTURE     Status: None   Collection Time    01/19/13 10:12 AM      Result Value Range Status   Specimen Description URINE, CLEAN CATCH   Final   Special Requests NONE   Final   Culture  Setup Time     Final   Value: 01/19/2013 12:14     Performed at [facility] Count     Final   Value: >=100,000 COLONIES/ML     Performed at [facility]    Culture     Final   Value: Multiple bacterial morphotypes present, none predominant. Suggest appropriate recollection if clinically indicated.     Performed at [facility]   Report Status 01/20/2013 FINAL   Final  CULTURE, ROUTINE-ABSCESS     Status: None   Collection Time    01/19/13  6:29 PM      Result Value Range Status   Specimen Description ABSCESS PERIRECTAL   Final   Special Requests PATIENT ON FOLLOWING ZOSYN   Final   Gram Stain     Final   Value: MODERATE WBC PRESENT,BOTH PMN AND MONONUCLEAR     NO SQUAMOUS EPITHELIAL CELLS SEEN     MODERATE GRAM POSITIVE COCCI IN CLUSTERS     Performed at [facility]   Culture     Final   Value: MODERATE METHICILLIN RESISTANT STAPHYLOCOCCUS AUREUS     Note: RIFAMPIN AND GENTAMICIN SHOULD NOT BE USED AS SINGLE DRUGS FOR TREATMENT OF STAPH INFECTIONS. This organism DOES NOT demonstrate inducible Clindamycin resistance in vitro. CRITICAL RESULT CALLED TO, READ BACK BY AND VERIFIED WITH: BONNIE GIBBS@[id number]       ON [id number] BY [facility]     Performed at [facility]   Report Status 01/22/2013 FINAL   Final   Organism ID, Bacteria METHICILLIN RESISTANT STAPHYLOCOCCUS AUREUS   Final  ANAEROBIC CULTURE     Status: None   Collection Time    01/19/13  6:29 PM      Result Value Range Status   Specimen Description ABSCESS PERIRECTAL   Final   Special Requests PATIENT ON FOLLOWING ZOSYN   Final   Gram Stain     Final   Value: MODERATE WBC PRESENT,BOTH PMN AND MONONUCLEAR     NO SQUAMOUS EPITHELIAL CELLS SEEN     MODERATE GRAM POSITIVE COCCI IN CLUSTERS     Performed at [facility]   Culture     Final   Value: NO ANAEROBES ISOLATED     Performed at [facility]   Report Status 01/24/2013 FINAL   Final  CULTURE, BLOOD (ROUTINE X 2)     Status: None   Collection Time    01/19/13  9:00 PM      Result Value Range Status   Specimen Description BLOOD  LEFT HAND   Final   Special Requests BOTTLES DRAWN AEROBIC ONLY  3CC   Final   Culture  Setup Time     Final   Value: 01/20/2013 01:46     Performed at [facility]   Culture     Final   Value:        BLOOD CULTURE RECEIVED NO GROWTH TO DATE CULTURE WILL BE HELD FOR 5 DAYS BEFORE ISSUING A FINAL NEGATIVE REPORT     Performed at [facility]   Report Status PENDING   Incomplete  CULTURE, BLOOD (ROUTINE X 2)     Status: None   Collection Time    01/19/13  9:10 PM      Result Value Range Status   Specimen Description BLOOD RIGHT HAND   Final   Special Requests BOTTLES DRAWN AEROBIC ONLY 3CC   Final   Culture  Setup Time     Final   Value: 01/20/2013 01:46     Performed at [facility]   Culture     Final   Value:        BLOOD CULTURE RECEIVED NO GROWTH TO DATE CULTURE WILL BE HELD FOR 5 DAYS BEFORE ISSUING A FINAL NEGATIVE REPORT     Performed at [facility]   Report Status PENDING   Incomplete  URINE CULTURE     Status: None   Collection Time    01/20/13  1:36 AM      Result Value Range Status   Specimen Description URINE, CLEAN CATCH   Final   Special Requests NONE   Final   Culture  Setup Time     Final   Value: 01/20/2013 03:10     Performed at [facility] Count     Final   Value: 4,000 COLONIES/ML     Performed at [facility]   Culture     Final   Value: INSIGNIFICANT GROWTH     Performed at [facility]   Report Status 01/21/2013 FINAL   Final   Studies/Results: No results found. Medications: I have reviewed the patient's current medications. Scheduled Meds: . amLODipine  10 mg Oral Daily  . amLODipine  5 mg Oral Once  . aspirin  81 mg Oral Daily  . carvedilol  25 mg Oral BID WC  . doxycycline  100 mg Oral Q12H  . heparin  5,000 Units Subcutaneous Q8H  . insulin aspart  0-9 Units Subcutaneous TID WC & HS  . insulin glargine  5 Units Subcutaneous QHS  . loratadine  10 mg Oral Daily  . pantoprazole  20 mg Oral Daily  . simvastatin  20 mg Oral Daily  .  triamcinolone ointment  1 application Topical BID   Continuous Infusions: . sodium chloride 75 mL/hr at 01/25/13 0801   PRN Meds:.albuterol, morphine injection, ondansetron (ZOFRAN) IV, oxyCODONE Assessment/Plan: Principal Problem:   Abscess of buttock, right Active Problems:   DIABETES MELLITUS, TYPE II   HYPERLIPIDEMIA   HYPERTENSION   GERD   Meningioma   Chronic systolic congestive heart failure   UTI (urinary tract infection)  # Abscess-right buttock: Improved.  CT showed amorphous collection within the right gluteal region adjacent to the gluteal fold.  Surgery was consulted and I&D was performed. Patient tolerated the procedure well; wound now packed w/ gauze. Currently patient is hemodynamically stable and afebrile. Her pain is well controlled.  Wound culture +MRSA.    -doxycycline 100mg   bid  #AKI: Pt creatinine increased from 0.72 to 1.22 within 48 hours.  Etiology could be from volume depletion since pt had several episodes of emesis yesterday and/or nephrotoxicity from vancomycin/zosyn.   Today, her creatinine has increased further.  Renal was consulted and felt contrast nephropathy.  We d/c lisinopril.  Continue IVF.      Lab Results  Component Value Date   CREATININE 2.69* 01/25/2013   -IVF @75ml /hr -recheck BMET in AM -per renal possibly d/c tomorrow if creatinine is downtrending   #Hypokalemia: Resolved.  K 3.2 on admission. Likely due to lasix therapy. Pt is not on KCl supplementation at home.  BMET this AM revealed a potassium of 4.0.  Lab Results  Component Value Date   K 4.1 01/25/2013    # Bladder outlet obstruction: Stable.  CT scan showed possible filling defect in the posterior aspect of the urinary bladder and concern of chronic bladder outlet obstruction. Patient was recently treated for UTI with ciprofloxacin. Per patient's daughter, patient's dysuria resolved, but patient still has increased urinary frequency (1-2 weeks). Initial urine collection was  not a clean catch. It is possible that patient has recurrent UTI. Patient is being treated w/ zosyn for abscess, which should also treat a UTI if present. Patient symptoms might be related to the findings on CT scan.  Pt has no suprapubic tenderness so probably not necessary to treat UTI.  Pt should have urology referral as outpatient workup for possible chronic bladder outlet obstruction  -bladder scan yesterday revealed PVR of 54cc  # DM2: Last Hgb A1C of 12.1% on 12/23/12. Patient was supposed to be on Lantus 10 units at bedtime daily and metformin 1000 mg twice a day at home. It seems that patient may not have been completely compliant to this regimen. She is currently living with her daughter, who is not very sure whether patient is doing insulin injection correctly.   -Continue lantus 8 units qhs and SSI  CBG (last 3)   Recent Labs  01/24/13 2150 01/25/13 0755 01/25/13 1156  GLUCAP 149* 122* 143*    # HTN:  BP is 131/64 mmHg on admission. She is on lisinopril 40mg  daily and Coreg 25 mg at home.  We discontinued lisinopril d/t AKI and started amlodipine 10mg  daily.   -Continue to monitor  BP Readings from Last 3 Encounters:  01/25/13 173/66  01/25/13 173/66  12/23/12 130/80    # History of RLL adenocarcinoma: Stable. She is s/p right lower lobectomy 12/15/10. Her recent chest CT on 05/07/12 showed small left pleural effusion and no evidence of pleural effusion on the right. Patient was followed up by oncology, Dr. Arbutus Ped in the past. Patient has Not followed up with oncologist since 07/2011. Currently patient does not have cough, shortness of breath or chest pain. She is using albuterol inhaler prn at home.  -Continue Albuterol inhaler PRN   # Chronic sCHF: Stable. Echo on 06/2010 with EF of 15-20% and Myoview showing thinning of apex, however, myoview on 12/12/10 with improved EF of 40% with nonischemic CM. Patient is on Lasix 20 mg daily, aspirin, Coreg 25 mg twice a day, lisinopril  40 mg daily at home. Weight was 205lbs on 12/23/12-->207lbs today. Clinically patient is euvolemic without JVD and the leg edema and lungs CTAB, therefore no concern for acute exacerbation at this time.  Continue to hold lasix given patient is at risk of developing sepsis.  IVF at 31ml/hr.  Continue BB, ASA, and statin.  Daily weights.   -  d/c lisinopril per renal recommendations d/t AKI -see above  # Meningioma: Patient's MRI on 10/2010 showed stable right frontal lobe meningioma compared with MRI on MRI 07/08/2010. Patient does not have an focal neurologic signs.  Pt should follow up as outpt.  #EKG changes: Stable.  EKG w/ ?J point elevation V2, V3, T wave inversion aVL. Patient w/o chest pain or SOB at this time. Given patient's hx of CHF and DM, we wanted to evaluated further w/ one troponin which was negative.    #Unspecified rash: Pt has scattered hyperpigmented plaques w/ overlying adherent scale to bilateral lower extremities. -biopsy site without erythema, swelling, or warmth; remove stiches on 01/28/13 -punch biopsy revealed possible atopic dermatitis, psoriasis -triamcinolone    Dispo: Disposition is deferred at this time, awaiting improvement of AKI.  Anticipated discharge in approximately 1-2 day(s).   The patient does have a current PCP Darden Palmer, MD) and does need an [facility] hospital follow-up appointment after discharge.    .Services Needed at time of discharge: Y = Yes, Blank = No PT:   OT:   RN:   Equipment:   Other:     LOS: 6 days   Boykin Peek, MD 01/25/2013, 1:23 PM

## 2013-01-25 NOTE — Progress Notes (Signed)
Seen and agreed 01/25/2013 Rudransh Bellanca Elizabeth PTA [phone] pager [phone] office    

## 2013-01-26 DIAGNOSIS — E876 Hypokalemia: Secondary | ICD-10-CM | POA: Diagnosis not present

## 2013-01-26 DIAGNOSIS — L0231 Cutaneous abscess of buttock: Secondary | ICD-10-CM | POA: Diagnosis not present

## 2013-01-26 DIAGNOSIS — N179 Acute kidney failure, unspecified: Secondary | ICD-10-CM | POA: Diagnosis not present

## 2013-01-26 DIAGNOSIS — N059 Unspecified nephritic syndrome with unspecified morphologic changes: Secondary | ICD-10-CM | POA: Diagnosis not present

## 2013-01-26 DIAGNOSIS — A4902 Methicillin resistant Staphylococcus aureus infection, unspecified site: Secondary | ICD-10-CM | POA: Diagnosis not present

## 2013-01-26 LAB — GLUCOSE, CAPILLARY: Glucose-Capillary: 128 mg/dL — ABNORMAL HIGH (ref 70–99)

## 2013-01-26 LAB — BASIC METABOLIC PANEL
Calcium: 8.4 mg/dL (ref 8.4–10.5)
GFR calc Af Amer: 19 mL/min — ABNORMAL LOW (ref >90)
GFR calc non Af Amer: 17 mL/min — ABNORMAL LOW (ref >90)
Glucose, Bld: 146 mg/dL — ABNORMAL HIGH (ref 70–99)
Potassium: 4.2 mEq/L (ref 3.5–5.1)
Sodium: 146 mEq/L — ABNORMAL HIGH (ref 135–145)

## 2013-01-26 LAB — CULTURE, BLOOD (ROUTINE X 2): Culture: NO GROWTH

## 2013-01-26 MED ORDER — ONDANSETRON 4 MG PO TBDP
4.0000 mg | ORAL_TABLET | Freq: Three times a day (TID) | ORAL | Status: DC | PRN
  Filled 2013-01-26 (×2): qty 1

## 2013-01-26 NOTE — Progress Notes (Signed)
  Date: 01/26/2013  Patient name: [name]  Medical record number: [id number]  Date of birth: 1934-02-06   This patient has been seen and the plan of care was discussed with the house staff. Please see their note for complete details. I concur with their findings with the following additions/corrections: [name] does have occasional, but not QD, post prandial N & V. Likely 2/2 Po ABX. She states her rash and itching is better. Cr down today. If cont to trend down tomorrow, D/C home. Issues to address: 1. Length of Doxy tx 2. When to remove stitches 3. Outpt urology - post bladder wall filling defect 4. Outpt [facility] - L breast mass seen on CT 3/14 4 cm subareolar   [name], MD 01/26/2013, 2:57 PM

## 2013-01-26 NOTE — Progress Notes (Signed)
Admit: 01/19/2013 LOS: 7  11F AKI 2/2 CIN in setting of perirectal abscess.    Subjective:  NAEON Eating and drinking No SOB Some Nausea this AM SCr slightly improved, multiple voids documented  12/10 0701 - 12/11 0700 In: 300 [P.O.:300] Out: -   Filed Weights   01/22/13 0521 01/23/13 0500 01/25/13 0532  Weight: 92.307 kg (203 lb 8 oz) 94.756 kg (208 lb 14.4 oz) 97.387 kg (214 lb 11.2 oz)    Current meds: reviewed  Current Labs: reviewed   Physical Exam:  Blood pressure 189/69, pulse 73, temperature 98.6 F (37 C), temperature source Oral, resp. rate 18, height 5\' 3"  (1.6 m), weight 97.387 kg (214 lb 11.2 oz), SpO2 97.00%. GEN: NAD, awake in bed ENT: NCAT  EYES: EOMI, muddy sclera  CV: RRR, nl s1s2  PULM: CTAB,nl wob  ABD: s/nt/nd +BS  EXT: 1+ pitting edema   Assessment/Plan 1. AKI 2/2 CIN: SCr downtrended over 24h.  Volume status ok and K / HCO3 ok.  BP up but expected with AKI and off her ACEi. I think it is ok for her to leave today or tomorrow if close f/u can be arranged for Monday next week including renal panel.  I spoke with her and family member and discussed that if she had any worsening she would need to return to ED immediately.    Sabra Heck MD 01/26/2013, 11:11 AM   Recent Labs Lab 01/21/13 0420  01/24/13 0510 01/25/13 0640 01/26/13 0430  NA 145  < > 143 145 146*  K 3.2*  < > 3.9 4.1 4.2  CL 109  < > 111 117* 115*  CO2 28  < > 22 22 22   GLUCOSE 154*  < > 128* 153* 146*  BUN 18  < > 20 20 19   CREATININE 1.22*  < > 2.53* 2.69* 2.59*  CALCIUM 7.9*  < > 7.9* 8.0* 8.4  PHOS 3.3  --   --   --   --   < > = values in this interval not displayed.  Recent Labs Lab 01/20/13 0532 01/23/13 0820  WBC 15.0* 8.2  HGB 10.6* 9.5*  HCT 34.7* 31.3*  MCV 80.3 80.7  PLT 292 302    Current Facility-Administered Medications  Medication Dose Route Frequency Provider Last Rate Last Dose  . albuterol (PROVENTIL HFA;VENTOLIN HFA) 108 (90 BASE) MCG/ACT  inhaler 2 puff  2 puff Inhalation Q4H PRN Lorretta Harp, MD      . amLODipine (NORVASC) tablet 10 mg  10 mg Oral Daily Windell Hummingbird, MD   10 mg at 01/26/13 1012  . amLODipine (NORVASC) tablet 5 mg  5 mg Oral Once Windell Hummingbird, MD      . aspirin chewable tablet 81 mg  81 mg Oral Daily Lorretta Harp, MD   81 mg at 01/26/13 1012  . carvedilol (COREG) tablet 25 mg  25 mg Oral BID WC Lorretta Harp, MD   25 mg at 01/26/13 0740  . doxycycline (VIBRA-TABS) tablet 100 mg  100 mg Oral Q12H Boykin Peek, MD   100 mg at 01/26/13 1012  . heparin injection 5,000 Units  5,000 Units Subcutaneous Q8H Lorretta Harp, MD   5,000 Units at 01/26/13 0534  . insulin aspart (novoLOG) injection 0-9 Units  0-9 Units Subcutaneous TID WC & HS Carlynn Purl, DO   1 Units at 01/26/13 0740  . insulin glargine (LANTUS) injection 5 Units  5 Units Subcutaneous QHS Boykin Peek, MD   5 Units at  01/25/13 2126  . loratadine (CLARITIN) tablet 10 mg  10 mg Oral Daily Lorretta Harp, MD   10 mg at 01/26/13 1012  . morphine 2 MG/ML injection 2 mg  2 mg Intravenous Q2H PRN Liz Malady, MD      . ondansetron [facility]) injection 4 mg  4 mg Intravenous Q8H PRN Boykin Peek, MD   4 mg at 01/25/13 1814  . oxyCODONE (Oxy IR/ROXICODONE) immediate release tablet 5-10 mg  5-10 mg Oral Q4H PRN Liz Malady, MD   10 mg at 01/24/13 2158  . pantoprazole (PROTONIX) EC tablet 20 mg  20 mg Oral Daily Lorretta Harp, MD   20 mg at 01/26/13 1012  . simvastatin (ZOCOR) tablet 20 mg  20 mg Oral Daily Lorretta Harp, MD   20 mg at 01/26/13 1012  . triamcinolone ointment (KENALOG) 0.1 % 1 application  1 application Topical BID Lorretta Harp, MD   1 application at 01/26/13 1012

## 2013-01-26 NOTE — Progress Notes (Addendum)
Subjective:  Pt denies any pain this AM except when her wound is packed.  She reports eating breakfast this AM and it "alll came back up."  According to the RN, she had an episode yesterday of belching and emesis also yesterday.  She states it was only a small amount and was undigested food without blood.  She denies any fever/chills.    I spoke with her daughter at length and explained that her kidney function is improving and that she will probably be able to go home tomorrow.       Objective: Vital signs in last 24 hours: Filed Vitals:   01/25/13 1318 01/25/13 2139 01/26/13 0558 01/26/13 1429  BP: 178/70 173/56 189/69 183/64  Pulse: 75 65 73 70  Temp: 98.2 F (36.8 C) 98.8 F (37.1 C) 98.6 F (37 C) 98.2 F (36.8 C)  TempSrc: Oral Oral Oral Oral  Resp: 20 20 18 20   Height:      Weight:      SpO2: 98% 98% 97% 98%   Weight change:   Intake/Output Summary (Last 24 hours) at 01/26/13 2059 Last data filed at 01/26/13 1430  Gross per 24 hour  Intake    140 ml  Output      0 ml  Net    140 ml   Physical Exam:   Filed Vitals:   01/26/13 1429  BP: 183/64  Pulse: 70  Temp: 98.2 F (36.8 C)  Resp: 20   General: Elderly female in NAD.    HEENT: EMOI, no scleral icterus  Neck: supple Lungs: CTAB, normal work of respiration, no wheezes, rales, ronchi Heart: regular rate and rhythm, no murmurs, gallops, or rubs, DP 2+ b/l Abdomen: soft, non-tender, non-distended, normal bowel sounds; no suprapubic tenderness  Extremities: warm extremities b/l, no BLE edema  Skin: right thigh s/p punch biospy without erythema, swelling, warmth: there are also scattered hyperpigmented plaques w/ small amount of overlying adherent scale to bilateral lower extremities  Neurologic: alert & oriented X3, cranial nerves II-XII grossly intact, strength grossly intact  Lab Results: Basic Metabolic Panel:  Recent Labs Lab 01/21/13 0420  01/25/13 0640 01/26/13 0430  NA 145  < > 145 146*  K  3.2*  < > 4.1 4.2  CL 109  < > 117* 115*  CO2 28  < > 22 22  GLUCOSE 154*  < > 153* 146*  BUN 18  < > 20 19  CREATININE 1.22*  < > 2.69* 2.59*  CALCIUM 7.9*  < > 8.0* 8.4  MG 1.9  --   --   --   PHOS 3.3  --   --   --   < > = values in this interval not displayed. Liver Function Tests: No results found for this basename: AST, ALT, ALKPHOS, BILITOT, PROT, ALBUMIN,  in the last 168 hours No results found for this basename: LIPASE, AMYLASE,  in the last 168 hours No results found for this basename: AMMONIA,  in the last 168 hours CBC:  Recent Labs Lab 01/20/13 0532 01/23/13 0820  WBC 15.0* 8.2  HGB 10.6* 9.5*  HCT 34.7* 31.3*  MCV 80.3 80.7  PLT 292 302   Cardiac Enzymes:  Recent Labs Lab 01/19/13 2225  TROPONINI <0.30   BNP: No results found for this basename: PROBNP,  in the last 168 hours D-Dimer: No results found for this basename: DDIMER,  in the last 168 hours CBG:  Recent Labs Lab 01/25/13 1156 01/25/13 1712 01/25/13  2122 01/26/13 0732 01/26/13 1151 01/26/13 1729  GLUCAP 143* 149* 122* 121* 125* 97   Drugs of Abuse     Component Value Date/Time   LABOPIA PPS 06/27/2012 1645   LABOPIA NEG 07/02/2006 2021   COCAINSCRNUR NEG 06/27/2012 1645   LABBENZ PPS 06/27/2012 1645   LABBENZ NEG 07/02/2006 2021   AMPHETMU NEG 07/02/2006 2021   LABBARB NEG 06/27/2012 1645    Urinalysis:  Recent Labs Lab 01/20/13 0136  COLORURINE YELLOW  LABSPEC 1.030  PHURINE 5.0  GLUCOSEU 100*  HGBUR LARGE*  BILIRUBINUR SMALL*  KETONESUR 15*  PROTEINUR 100*  UROBILINOGEN 1.0  NITRITE NEGATIVE  LEUKOCYTESUR LARGE*    Micro Results: Recent Results (from the past 240 hour(s))  URINE CULTURE     Status: None   Collection Time    01/19/13 10:12 AM      Result Value Range Status   Specimen Description URINE, CLEAN CATCH   Final   Special Requests NONE   Final   Culture  Setup Time     Final   Value: 01/19/2013 12:14     Performed at [facility] Count      Final   Value: >=100,000 COLONIES/ML     Performed at [facility]   Culture     Final   Value: Multiple bacterial morphotypes present, none predominant. Suggest appropriate recollection if clinically indicated.     Performed at [facility]   Report Status 01/20/2013 FINAL   Final  CULTURE, ROUTINE-ABSCESS     Status: None   Collection Time    01/19/13  6:29 PM      Result Value Range Status   Specimen Description ABSCESS PERIRECTAL   Final   Special Requests PATIENT ON FOLLOWING ZOSYN   Final   Gram Stain     Final   Value: MODERATE WBC PRESENT,BOTH PMN AND MONONUCLEAR     NO SQUAMOUS EPITHELIAL CELLS SEEN     MODERATE GRAM POSITIVE COCCI IN CLUSTERS     Performed at [facility]   Culture     Final   Value: MODERATE METHICILLIN RESISTANT STAPHYLOCOCCUS AUREUS     Note: RIFAMPIN AND GENTAMICIN SHOULD NOT BE USED AS SINGLE DRUGS FOR TREATMENT OF STAPH INFECTIONS. This organism DOES NOT demonstrate inducible Clindamycin resistance in vitro. CRITICAL RESULT CALLED TO, READ BACK BY AND VERIFIED WITH: BONNIE GIBBS@[id number]       ON [id number] BY [facility]     Performed at [facility]   Report Status 01/22/2013 FINAL   Final   Organism ID, Bacteria METHICILLIN RESISTANT STAPHYLOCOCCUS AUREUS   Final  ANAEROBIC CULTURE     Status: None   Collection Time    01/19/13  6:29 PM      Result Value Range Status   Specimen Description ABSCESS PERIRECTAL   Final   Special Requests PATIENT ON FOLLOWING ZOSYN   Final   Gram Stain     Final   Value: MODERATE WBC PRESENT,BOTH PMN AND MONONUCLEAR     NO SQUAMOUS EPITHELIAL CELLS SEEN     MODERATE GRAM POSITIVE COCCI IN CLUSTERS     Performed at [facility]   Culture     Final   Value: NO ANAEROBES ISOLATED     Performed at [facility]   Report Status 01/24/2013 FINAL   Final  CULTURE, BLOOD (ROUTINE X 2)     Status: None   Collection Time    01/19/13  9:00 PM      Result Value Range Status   Specimen  Description BLOOD LEFT HAND   Final   Special Requests BOTTLES DRAWN AEROBIC ONLY 3CC   Final   Culture  Setup Time     Final   Value: 01/20/2013 01:46     Performed at [facility]   Culture     Final   Value: NO GROWTH 5 DAYS     Performed at [facility]   Report Status 01/26/2013 FINAL   Final  CULTURE, BLOOD (ROUTINE X 2)     Status: None   Collection Time    01/19/13  9:10 PM      Result Value Range Status   Specimen Description BLOOD RIGHT HAND   Final   Special Requests BOTTLES DRAWN AEROBIC ONLY 3CC   Final   Culture  Setup Time     Final   Value: 01/20/2013 01:46     Performed at [facility]   Culture     Final   Value: NO GROWTH 5 DAYS     Performed at [facility]   Report Status 01/26/2013 FINAL   Final  URINE CULTURE     Status: None   Collection Time    01/20/13  1:36 AM      Result Value Range Status   Specimen Description URINE, CLEAN CATCH   Final   Special Requests NONE   Final   Culture  Setup Time     Final   Value: 01/20/2013 03:10     Performed at [facility] Count     Final   Value: 4,000 COLONIES/ML     Performed at [facility]   Culture     Final   Value: INSIGNIFICANT GROWTH     Performed at [facility]   Report Status 01/21/2013 FINAL   Final   Studies/Results: No results found. Medications: I have reviewed the patient's current medications. Scheduled Meds: . amLODipine  10 mg Oral Daily  . amLODipine  5 mg Oral Once  . aspirin  81 mg Oral Daily  . carvedilol  25 mg Oral BID WC  . heparin  5,000 Units Subcutaneous Q8H  . insulin aspart  0-9 Units Subcutaneous TID WC & HS  . insulin glargine  5 Units Subcutaneous QHS  . loratadine  10 mg Oral Daily  . pantoprazole  20 mg Oral Daily  . simvastatin  20 mg Oral Daily  . triamcinolone ointment  1 application Topical BID   Continuous Infusions:   PRN Meds:.albuterol, morphine injection, ondansetron,  oxyCODONE Assessment/Plan: Principal Problem:   Abscess of buttock, right Active Problems:   DIABETES MELLITUS, TYPE II   HYPERLIPIDEMIA   HYPERTENSION   GERD   Meningioma   Chronic systolic congestive heart failure   UTI (urinary tract infection)  # Abscess-right buttock: Improved.  CT showed amorphous collection within the right gluteal region adjacent to the gluteal fold.  Surgery was consulted and I&D was performed. Patient tolerated the procedure well; wound now packed w/ gauze. Currently patient is hemodynamically stable and afebrile. Her pain is well controlled.  Wound culture +MRSA.    -d/c doxycycline 100mg  bid  #AKI: Improving. Pt creatinine increased from 0.72 to 1.22 within 48 hours.  Etiology could be from volume depletion since pt had several episodes of emesis yesterday and/or nephrotoxicity from vancomycin/zosyn.   Today, her creatinine has increased further.  Renal was consulted  and felt contrast nephropathy.  We d/c lisinopril.  Pt received IVF and kidney function improved.        Lab Results  Component Value Date   CREATININE 2.59* 01/26/2013   -recheck BMET in AM - d/c tomorrow   #Hypokalemia: Resolved.  K 3.2 on admission. Likely due to lasix therapy. Pt is not on KCl supplementation at home.  BMET this AM revealed a potassium of 4.0.  Lab Results  Component Value Date   K 4.2 01/26/2013    # Bladder outlet obstruction: Stable.  CT scan showed possible filling defect in the posterior aspect of the urinary bladder and concern of chronic bladder outlet obstruction. Patient was recently treated for UTI with ciprofloxacin. Per patient's daughter, patient's dysuria resolved, but patient still has increased urinary frequency (1-2 weeks). Initial urine collection was not a clean catch. It is possible that patient has recurrent UTI. Patient is being treated w/ zosyn for abscess, which should also treat a UTI if present. Patient symptoms might be related to the findings  on CT scan.  Pt has no suprapubic tenderness so probably not necessary to treat UTI.  Pt should have urology referral as outpatient workup for possible chronic bladder outlet obstruction  -bladder scan yesterday revealed PVR of 54cc  # DM2: Last Hgb A1C of 12.1% on 12/23/12. Patient was supposed to be on Lantus 10 units at bedtime daily and metformin 1000 mg twice a day at home. It seems that patient may not have been completely compliant to this regimen. She is currently living with her daughter, who is not very sure whether patient is doing insulin injection correctly.   -Continue lantus 8 units qhs and SSI  CBG (last 3)   Recent Labs  01/26/13 0732 01/26/13 1151 01/26/13 1729  GLUCAP 121* 125* 97    # HTN:  BP is 131/64 mmHg on admission. She is on lisinopril 40mg  daily and Coreg 25 mg at home.  We discontinued lisinopril d/t AKI and started amlodipine 10mg  daily.   -Continue to monitor -Should be able to restart lisinopril when d/c per renal  BP Readings from Last 3 Encounters:  01/26/13 183/64  01/26/13 183/64  12/23/12 130/80    # History of RLL adenocarcinoma: Stable. She is s/p right lower lobectomy 12/15/10. Her recent chest CT on 05/07/12 showed small left pleural effusion and no evidence of pleural effusion on the right. Patient was followed up by oncology, Dr. Arbutus Ped in the past. Patient has Not followed up with oncologist since 07/2011. Currently patient does not have cough, shortness of breath or chest pain. She is using albuterol inhaler prn at home.  -Continue Albuterol inhaler PRN   # Chronic sCHF: Stable. Echo on 06/2010 with EF of 15-20% and Myoview showing thinning of apex, however, myoview on 12/12/10 with improved EF of 40% with nonischemic CM. Patient is on Lasix 20 mg daily, aspirin, Coreg 25 mg twice a day, lisinopril 40 mg daily at home. Weight was 205lbs on 12/23/12-->207lbs today. Clinically patient is euvolemic without JVD and the leg edema and lungs CTAB,  therefore no concern for acute exacerbation at this time.  Continue to hold lasix given patient is at risk of developing sepsis.  IVF at 78ml/hr.  Continue BB, ASA, and statin.  Daily weights.   -d/c lisinopril per renal recommendations d/t AKI -see above  # Meningioma: Patient's MRI on 10/2010 showed stable right frontal lobe meningioma compared with MRI on MRI 07/08/2010. Patient does not have an focal  neurologic signs.  Pt should follow up as outpt.  #EKG changes: Stable.  EKG w/ ?J point elevation V2, V3, T wave inversion aVL. Patient w/o chest pain or SOB at this time. Given patient's hx of CHF and DM, we wanted to evaluated further w/ one troponin which was negative.    #Unspecified rash: Pt has scattered hyperpigmented plaques w/ overlying adherent scale to bilateral lower extremities. -biopsy site without erythema, swelling, or warmth; remove stiches tomorrow -punch biopsy revealed possible atopic dermatitis, psoriasis -triamcinolone   #Nausea/Vomiting: -clear liquids for lunch -advance diet as tolerated  Dispo: Anticipated discharge tomorrow.   The patient does have a current PCP Darden Palmer, MD) and does need an [facility] hospital follow-up appointment after discharge.   .Services Needed at time of discharge: Y = Yes, Blank = No PT:   OT:   RN:   Equipment:   Other:     LOS: 7 days   Boykin Peek, MD 01/26/2013, 8:59 PM

## 2013-01-27 LAB — BASIC METABOLIC PANEL
BUN: 18 mg/dL (ref 6–23)
CO2: 24 mEq/L (ref 19–32)
Calcium: 8.5 mg/dL (ref 8.4–10.5)
Creatinine, Ser: 2.5 mg/dL — ABNORMAL HIGH (ref 0.50–1.10)
GFR calc non Af Amer: 17 mL/min — ABNORMAL LOW (ref >90)
Glucose, Bld: 83 mg/dL (ref 70–99)
Potassium: 3.9 mEq/L (ref 3.5–5.1)
Sodium: 149 mEq/L — ABNORMAL HIGH (ref 135–145)

## 2013-01-27 LAB — GLUCOSE, CAPILLARY: Glucose-Capillary: 88 mg/dL (ref 70–99)

## 2013-01-27 MED ORDER — DOXYCYCLINE HYCLATE 100 MG PO CAPS: 100.0000 mg | ORAL_CAPSULE | Freq: Two times a day (BID) | ORAL | Status: DC

## 2013-01-27 MED ORDER — AMLODIPINE BESYLATE 10 MG PO TABS: 10.0000 mg | ORAL_TABLET | Freq: Every day | ORAL | Status: DC

## 2013-01-27 NOTE — Progress Notes (Signed)
Physical Therapy Treatment Patient Details Name: [name] MRN: [id number] DOB: 07/13/1933 Today's Date: 01/27/2013 Time: 4098-1191 PT Time Calculation (min): 23 min  PT Assessment / Plan / Recommendation  History of Present Illness Patient reports having onset of pain to R lower buttock since yesterday. She did not notice it draining any fluid nor has she had F/C, night sweats, change in appetite, malaise. Abscess R buttock - surgery took pt to OR for I&D   PT Comments   Pt. With urinary incontinence upon standing and taking first few steps to bathroom.  This will be a safety issue at home if this continues.  Suggest pt./family consider use of adult pull ups if this continues.  She did advance her tolerance of ambulation significantly today.  Will need  24 hour assist/supervision at home.  Follow Up Recommendations  Home health PT     Does the patient have the potential to tolerate intense rehabilitation     Barriers to Discharge        Equipment Recommendations  3in1 (PT)    Recommendations for Other Services OT consult  Frequency Min 3X/week   Progress towards PT Goals Progress towards PT goals: Progressing toward goals  Plan Current plan remains appropriate    Precautions / Restrictions Precautions Precautions: Fall;Other (comment) (urinary incontinence upon standing) Restrictions Weight Bearing Restrictions: No   Pertinent Vitals/Pain No pain, no distress    Mobility  Bed Mobility Bed Mobility: Not assessed (seated at dege of bed upon PT arrival) Transfers Transfers: Sit to Stand;Stand to Sit Sit to Stand: 5: Supervision;With upper extremity assist;From bed;From chair/3-in-1;With armrests Stand to Sit: 5: Supervision;To chair/3-in-1;With upper extremity assist Details for Transfer Assistance: occasional cue for safety ; pt. had urinary incontinence upon standing and taking first few steps to bathroom.  Once seated on toliet, she was able to continue to  void. Ambulation/Gait Ambulation/Gait Assistance: 5: Supervision Ambulation Distance (Feet): 300 Feet Assistive device: Rolling walker Ambulation/Gait Assistance Details: pt. somewhat distracted by objects and people in hallway but able to turn head to investigate without LOB noted.  Avoiding objects in her pathway appropriately.  Supervision for safety Gait Pattern: Step-through pattern;Decreased stride length Gait velocity: decreased Stairs: No    Exercises     PT Diagnosis:    PT Problem List:   PT Treatment Interventions:     PT Goals (current goals can now be found in the care plan section)    Visit Information  Last PT Received On: 01/27/13 Assistance Needed: +1 History of Present Illness: Patient reports having onset of pain to R lower buttock since yesterday. She did not notice it draining any fluid nor has she had F/C, night sweats, change in appetite, malaise. Abscess R buttock - surgery took pt to OR for I&D    Subjective Data  Subjective: Pt. somnolent, responds to questions but does not engage in conversation   Cognition  Cognition Arousal/Alertness: Awake/alert Behavior During Therapy: WFL for tasks assessed/performed Overall Cognitive Status: Within Functional Limits for tasks assessed    Balance  Dynamic Standing Balance Dynamic Standing - Balance Support: No upper extremity supported;During functional activity Dynamic Standing - Level of Assistance: 5: Stand by assistance Dynamic Standing - Balance Activities: Reaching for objects  End of Session PT - End of Session Equipment Utilized During Treatment: Gait belt Activity Tolerance: Patient tolerated treatment well Patient left: in chair;with call bell/phone within reach;with chair alarm set Nurse Communication: Mobility status;Other (comment) (urinary incontinence)   GP  Ferman Hamming 01/27/2013, 9:03 AM Weldon Picking PT Acute Rehab Services [phone] Bee[facility] [phone]

## 2013-01-27 NOTE — Progress Notes (Signed)
Patient evaluated for community based chronic disease management services with [facility] Care Management Program as a benefit of patient's [employer]. Spoke with patient and daughter at bedside to explain [facility] Care Management services.  Services have been accepted.  Written consents obtained.  Patient will receive a post discharge transition of care call and will be evaluated for monthly home visits for assessments and diabetes disease process education.  Left contact information and [facility] literature at bedside. Made Inpatient Case Manager aware that [facility] Care Management following. Of note, [facility] Care Management services does not replace or interfere with any services that are arranged by inpatient case management or social work.  For additional questions or referrals please contact Anibal Henderson BSN RN [facility] Liaison at [phone].

## 2013-01-27 NOTE — Progress Notes (Signed)
Patient ID: Princesa Willig, female   DOB: 03-Jun-1933, 79 y.o.   MRN: [id number] Internal Medicine Attending - Late Entry Date: 01/27/2013  Patient name: Tymara Saur Medical record number: [id number] Date of birth: [birth year] Age: 79 y.o. Gender: female  I saw and evaluated the patient and discussed her care on a.m. rounds with house staff.  She was doing well without any acute complaints.  Her abscess site was clean, with no erythema, drainage, or tenderness.  Her creatinine has improved today to a value of 2.50.  I agree with the plan to discharge home today with outpatient followup on Monday, which should include a recheck of her wound and repeat metabolic panel to assess her creatinine.  Agree with plan to extend doxycycline for another 5 days.  We recommended to patient and her daughter that she needs an outpatient urology appointment to follow up the filling defect in the posterior aspect of the urinary bladder noted on abdominal CT scan 01/19/2013 and we recommend an outpatient mammogram to followup a left breast mass previously noted on a chest CT scan 05/07/2012.

## 2013-01-27 NOTE — Progress Notes (Signed)
Discharge home. Home discharge instruction given to daughter. [facility] will follow up patient for home needs, no questions verbalized by daughter with discharge.

## 2013-01-28 DIAGNOSIS — K612 Anorectal abscess: Secondary | ICD-10-CM | POA: Diagnosis not present

## 2013-01-28 DIAGNOSIS — Z794 Long term (current) use of insulin: Secondary | ICD-10-CM | POA: Diagnosis not present

## 2013-01-28 DIAGNOSIS — E119 Type 2 diabetes mellitus without complications: Secondary | ICD-10-CM | POA: Diagnosis not present

## 2013-01-28 DIAGNOSIS — I10 Essential (primary) hypertension: Secondary | ICD-10-CM | POA: Diagnosis not present

## 2013-01-28 DIAGNOSIS — I509 Heart failure, unspecified: Secondary | ICD-10-CM | POA: Diagnosis not present

## 2013-01-28 DIAGNOSIS — N390 Urinary tract infection, site not specified: Secondary | ICD-10-CM | POA: Diagnosis not present

## 2013-01-29 ENCOUNTER — Other Ambulatory Visit: Payer: Self-pay | Admitting: Internal Medicine

## 2013-01-30 ENCOUNTER — Ambulatory Visit (INDEPENDENT_AMBULATORY_CARE_PROVIDER_SITE_OTHER): Payer: Medicare Other | Admitting: Internal Medicine

## 2013-01-30 VITALS — BP 160/74 | HR 67 | Temp 100.0°F | Resp 20 | Wt 197.0 lb

## 2013-01-30 DIAGNOSIS — N390 Urinary tract infection, site not specified: Secondary | ICD-10-CM | POA: Diagnosis not present

## 2013-01-30 DIAGNOSIS — N320 Bladder-neck obstruction: Secondary | ICD-10-CM | POA: Diagnosis not present

## 2013-01-30 DIAGNOSIS — I10 Essential (primary) hypertension: Secondary | ICD-10-CM

## 2013-01-30 DIAGNOSIS — E119 Type 2 diabetes mellitus without complications: Secondary | ICD-10-CM | POA: Diagnosis not present

## 2013-01-30 DIAGNOSIS — N179 Acute kidney failure, unspecified: Secondary | ICD-10-CM

## 2013-01-30 DIAGNOSIS — R21 Rash and other nonspecific skin eruption: Secondary | ICD-10-CM | POA: Diagnosis not present

## 2013-01-30 DIAGNOSIS — L0231 Cutaneous abscess of buttock: Secondary | ICD-10-CM | POA: Diagnosis not present

## 2013-01-30 LAB — BASIC METABOLIC PANEL WITH GFR
BUN: 15 mg/dL (ref 6–23)
Calcium: 8.8 mg/dL (ref 8.4–10.5)
GFR, Est African American: 25 mL/min — ABNORMAL LOW
Glucose, Bld: 323 mg/dL — ABNORMAL HIGH (ref 70–99)

## 2013-01-30 MED ORDER — HYDROCODONE-ACETAMINOPHEN 5-325 MG PO TABS: 1.0000 | ORAL_TABLET | Freq: Four times a day (QID) | ORAL | Status: DC | PRN

## 2013-01-30 MED ORDER — LORATADINE 10 MG PO CAPS: 10.0000 mg | ORAL_CAPSULE | Freq: Every day | ORAL | Status: DC

## 2013-01-30 NOTE — Patient Instructions (Addendum)
1. Will check your blood today.  2. Continue to take your new BP med Norvasc. Please check your BP daily at home and bring the readings back in two weeks.  3.  will send urology referral 4. please call your oncologist for consultation.  5. Please follow up in 1-2 weeks.

## 2013-01-31 DIAGNOSIS — N390 Urinary tract infection, site not specified: Secondary | ICD-10-CM | POA: Diagnosis not present

## 2013-01-31 DIAGNOSIS — I10 Essential (primary) hypertension: Secondary | ICD-10-CM | POA: Diagnosis not present

## 2013-01-31 DIAGNOSIS — K612 Anorectal abscess: Secondary | ICD-10-CM | POA: Diagnosis not present

## 2013-01-31 DIAGNOSIS — E119 Type 2 diabetes mellitus without complications: Secondary | ICD-10-CM | POA: Diagnosis not present

## 2013-01-31 DIAGNOSIS — I509 Heart failure, unspecified: Secondary | ICD-10-CM | POA: Diagnosis not present

## 2013-02-01 ENCOUNTER — Encounter: Payer: Self-pay | Admitting: Internal Medicine

## 2013-02-01 ENCOUNTER — Ambulatory Visit: Payer: Medicare Other | Admitting: Internal Medicine

## 2013-02-01 DIAGNOSIS — N390 Urinary tract infection, site not specified: Secondary | ICD-10-CM | POA: Diagnosis not present

## 2013-02-01 DIAGNOSIS — I10 Essential (primary) hypertension: Secondary | ICD-10-CM | POA: Diagnosis not present

## 2013-02-01 DIAGNOSIS — K612 Anorectal abscess: Secondary | ICD-10-CM | POA: Diagnosis not present

## 2013-02-01 DIAGNOSIS — I509 Heart failure, unspecified: Secondary | ICD-10-CM | POA: Diagnosis not present

## 2013-02-01 DIAGNOSIS — E119 Type 2 diabetes mellitus without complications: Secondary | ICD-10-CM | POA: Diagnosis not present

## 2013-02-01 NOTE — Assessment & Plan Note (Signed)
BP Readings from Last 3 Encounters:  02/01/13 160/74  01/27/13 182/64  01/27/13 182/64    Lab Results  Component Value Date   [name] 145 01/30/2013   K 3.7 01/30/2013   CREATININE 2.16* 01/30/2013    Assessment: Blood pressure control: moderately elevated Progress toward BP goal:  unchanged Patient has been on lisinopril, carvedilol, and Lasix for BP control. Her lisinopril was on hold due to AKI and amlodipine 10 daily added upon discharge  Plan: Medications:  continue current medications Educational resources provided:   Self management tools provided:    1. continue current therapy since she just took one dose of amlodipine since discharge. 2. monitor renal function closely 3. consider to switch her back to ACEI once renal function returns to baseline. 4. patient is instructed to check her blood pressure daily at home and bring the readings to the clinic at 2-week followup visit. 5. patient understands and agrees with the above plan.

## 2013-02-01 NOTE — Assessment & Plan Note (Signed)
Assessment Patient was noted to have a scattered hyperpigmentation plaques to bilateral lower extremities.  Punch biopsy performed.  Plan -- Left thigh suture x 1 removed.  The site is without erythema, swelling, warmth to touch, tenderness to touch. -The pathology report showed " spongiotic dermatitis with focal superficial pustular change...Marland KitchenMarland KitchenMarland Kitchen this type of pattern could be observed in a eczematous reaction such as a contact or nummular dermatitis...... could be a guttate lesion of psoriasis or if it even represent a drug reaction such as acute generalized exanthematous pustulosis".  - Given above report, I will recs to close follow up with her rash. May initiate the treatment for psoriasis if the rash persists and determined not related to contact dermatitis or drug reaction.  - Followup in 2 weeks

## 2013-02-01 NOTE — Assessment & Plan Note (Addendum)
Assessment Patient was hospitalized for the treatment of her right buttock abscess.  She states that she feels fine, and home health agency changes her wound dressing daily.  She is discharged on doxycycline 100 twice a day x5 days.  She states that doxycycline makes her nauseated if taking on an empty stomach.   Plan - Continue home health wound care - Patient is instructed to take doxycycline with food or after meals.  She is instructed to call the clinic if she is still unable to tolerate doxycycline.

## 2013-02-01 NOTE — Assessment & Plan Note (Signed)
Assessment Patient is a somatic today. She was noted to have possible filling defect in the posterior aspect of her urinary bladder by CT scan and concern of chronic bladder outlet obstruction during the admission in December 2014.  Bladder scan reviewed the PVR of 54 cc. Patient had possible recurrent UTI during this admission, which was covered by Zosyn for abscess.  An urology referral was recommended.  Plan - Will send urology referral

## 2013-02-01 NOTE — Progress Notes (Signed)
Patient ID: [name], female   DOB: [birth year], 79 y.o.   MRN: [id number]    Patient: [name]   MRN: [id number]  DOB: 16-Dec-1933  PCP: Darden Palmer, MD   Subjective:    CC: Follow-up   HPI: [name] is a 79 y.o. female with a PMHx of HTN, uncontrolled T2DM (last A1c 12.8 on 12/23/12), HLD, CVA, CHF, right long adenocarcinoma, who presented to clinic today for hospital followup:  Patient was hospitalized between 12/4-12/01/2013 for the evaluation of her right buttock abscess in the setting of uncontrolled diabetes.  Surgery was consulted and I& D. was performed.  Wound culture revealed pansensitive MRSA.  She was treated with vancomycin and Zosyn initially, which was switched to Bactrim and then doxycycline after AKI.  She was discharged home with home health agency assisting wound care and dressing change.  She states that she feels fine.  Denies fever, chills, chest pain, shortness of breath, nausea/vomiting/abdomen pain or weakness.  She states that her wound looked good per home health nurse report.  She is here for followup.    Review of Systems: Per HPI.   Current Outpatient Medications: Current Outpatient Prescriptions  Medication Sig Dispense Refill  . albuterol (PROVENTIL HFA;VENTOLIN HFA) 108 (90 BASE) MCG/ACT inhaler Inhale 2 puffs into the lungs every 4 (four) hours as needed for wheezing.  3 Inhaler  1  . albuterol (PROVENTIL) (2.5 MG/3ML) 0.083% nebulizer solution Take 3 mLs (2.5 mg total) by nebulization every 6 (six) hours as needed for wheezing.  75 mL  12  . amLODipine (NORVASC) 10 MG tablet Take 1 tablet (10 mg total) by mouth daily.  30 tablet  3  . aspirin 81 MG tablet Take 81 mg by mouth daily.       . Blood Glucose Monitoring Suppl (ACCU-CHEK AVIVA PLUS) W/DEVICE KIT 1 each by Does not apply route 3 (three) times daily. dx code 250.00 insulin requiring  1 kit  1  . carvedilol (COREG) 25 MG tablet Take 1 tablet (25 mg total) by mouth 2 (two) times  daily with a meal.  180 tablet  4  . [facility] LANCETS MICRO THIN 33G MISC Check blood sugar 3 times a day before meals dx code 250.00 insulin requiring  100 each  12  . doxycycline (VIBRAMYCIN) 100 MG capsule Take 1 capsule (100 mg total) by mouth every 12 (twelve) hours. One po bid x 5 days  10 capsule  0  . furosemide (LASIX) 20 MG tablet Take 20 mg by mouth daily.      Marland Kitchen HYDROcodone-acetaminophen (NORCO/VICODIN) 5-325 MG per tablet TAKE 1 TABLET BY MOUTH TWICE A DAY AS NEEDED FOR PAIN. Do not fill before 01/07/13  60 tablet  0  . Insulin Glargine (LANTUS SOLOSTAR) 100 UNIT/ML SOPN Inject 10 Units into the skin at bedtime.  10 mL  5  . Lancet Devices (ACCU-CHEK SOFTCLIX) lancets Check blood sugar 3 times a day before meals dx code 250.00 insulin requiring  100 each  12  . Loratadine 10 MG CAPS Take 1 capsule (10 mg total) by mouth daily.  30 each  0  . pantoprazole (PROTONIX) 20 MG tablet TAKE 1 TABLET (20 MG TOTAL) BY MOUTH DAILY.  30 tablet  5  . simvastatin (ZOCOR) 20 MG tablet TAKE 1 TABLET BY MOUTH EVERY DAY  90 tablet  3  . triamcinolone ointment (KENALOG) 0.1 % Apply 1 application topically 2 (two) times daily.  30 g  0  . glucose  blood (ACCU-CHEK AVIVA PLUS) test strip Check blood sugar 3 times a day before meals dx code 250.00 insulin requiring  100 each  12  . HYDROcodone-acetaminophen (NORCO/VICODIN) 5-325 MG per tablet Take 1 tablet by mouth every 6 (six) hours as needed for moderate pain.  20 tablet  0   No current facility-administered medications for this visit.    Allergies: No Known Allergies  Past Medical History  Diagnosis Date  . Diabetes mellitus 2007    HgA1C (02/20/2010) = 9.2, HgA1C (03/20/2009) = 12.1  . Hyperlipidemia   . Hypertension   . GERD (gastroesophageal reflux disease)   . CVA (cerebrovascular accident) 2006     right embolic stroke, no residual deficits  . Diverticulitis   . CVA (cerebral infarction) 7-yrs ago  . Arthritis   . Dysphagia   . CHF  (congestive heart failure)   . DIABETES MELLITUS, TYPE II 12/04/2005  . HYPERLIPIDEMIA 12/04/2005  . HYPERTENSION 12/04/2005  . GERD 12/04/2005  . ARTHRITIS, KNEE 03/24/2006  . Lung mass 07/08/2010  . Meningioma 07/21/2010  . Hemangioma of liver 12/02/2010  . Depression 12/02/2010  . Adenocarcinoma of lung      Right upper lobe adenocarcinoma.     Objective:    Physical Exam: Filed Vitals:   02/01/13 1644 02/01/13 1645  BP: 177/77 160/74  Pulse: 67   Temp: 100 F (37.8 C)   Resp: 20   Weight: 197 lb (89.359 kg)   SpO2: 99%     General: Vital signs reviewed and noted. Well-developed, well-nourished, in no acute distress; alert, appropriate and cooperative throughout examination.  Head: Normocephalic, atraumatic.  Lungs:  Normal respiratory effort. Clear to auscultation BL without crackles or wheezes.  Heart: RRR. S1 and S2 normal without gallop, rubs,murmur.  Abdomen:  BS normoactive. Soft, Nondistended, non-tender.  No masses or organomegaly.  Extremities: No pretibial edema. Lead to gluteal region dressing.    Assessment/ Plan:

## 2013-02-01 NOTE — Assessment & Plan Note (Addendum)
Assessment--noted during this admission in Dec 2014.  Her baselin creatinine is 0.72-1. She had elevated Cr of ~2.6.  Patient had a contrast.  Renal was consulted and etiology was thought to be contrast nephropathy.  Her lisinopril and metformin were discontinued.  She received IV fluids and her AKI was resolving upon discharge.  Plan: - Will repeat BMP today  Recent Labs Lab 01/26/13 0430 01/27/13 0535 01/30/13 1652  CREATININE 2.59* 2.50* 2.16*   - Her Cr continues to trend down now.  - Continue to hold the lisinopril and metformin. ( Doubt the effect of metformin in a setting of glycemic toxicity 2/2 uncontrolled DM.  - Will need close follow up on her renal function.  - Follow up in 2 weeks

## 2013-02-02 DIAGNOSIS — I509 Heart failure, unspecified: Secondary | ICD-10-CM | POA: Diagnosis not present

## 2013-02-02 DIAGNOSIS — E119 Type 2 diabetes mellitus without complications: Secondary | ICD-10-CM | POA: Diagnosis not present

## 2013-02-02 DIAGNOSIS — K612 Anorectal abscess: Secondary | ICD-10-CM | POA: Diagnosis not present

## 2013-02-02 DIAGNOSIS — N390 Urinary tract infection, site not specified: Secondary | ICD-10-CM | POA: Diagnosis not present

## 2013-02-02 DIAGNOSIS — I10 Essential (primary) hypertension: Secondary | ICD-10-CM | POA: Diagnosis not present

## 2013-02-02 NOTE — Progress Notes (Signed)
Case discussed with Dr. Li at the time of the visit.  We reviewed the resident's history and exam and pertinent patient test results.  I agree with the assessment, diagnosis, and plan of care documented in the resident's note. 

## 2013-02-03 ENCOUNTER — Other Ambulatory Visit: Payer: Self-pay | Admitting: *Deleted

## 2013-02-03 MED ORDER — FUROSEMIDE 20 MG PO TABS: 20.0000 mg | ORAL_TABLET | Freq: Every day | ORAL | Status: DC

## 2013-02-06 DIAGNOSIS — E119 Type 2 diabetes mellitus without complications: Secondary | ICD-10-CM | POA: Diagnosis not present

## 2013-02-06 DIAGNOSIS — K612 Anorectal abscess: Secondary | ICD-10-CM | POA: Diagnosis not present

## 2013-02-06 DIAGNOSIS — N390 Urinary tract infection, site not specified: Secondary | ICD-10-CM | POA: Diagnosis not present

## 2013-02-06 DIAGNOSIS — I509 Heart failure, unspecified: Secondary | ICD-10-CM | POA: Diagnosis not present

## 2013-02-06 DIAGNOSIS — I10 Essential (primary) hypertension: Secondary | ICD-10-CM | POA: Diagnosis not present

## 2013-02-07 ENCOUNTER — Other Ambulatory Visit: Payer: Self-pay | Admitting: *Deleted

## 2013-02-07 DIAGNOSIS — N390 Urinary tract infection, site not specified: Secondary | ICD-10-CM | POA: Diagnosis not present

## 2013-02-07 DIAGNOSIS — K612 Anorectal abscess: Secondary | ICD-10-CM | POA: Diagnosis not present

## 2013-02-07 DIAGNOSIS — I10 Essential (primary) hypertension: Secondary | ICD-10-CM | POA: Diagnosis not present

## 2013-02-07 DIAGNOSIS — I509 Heart failure, unspecified: Secondary | ICD-10-CM | POA: Diagnosis not present

## 2013-02-07 DIAGNOSIS — L0231 Cutaneous abscess of buttock: Secondary | ICD-10-CM

## 2013-02-07 DIAGNOSIS — E119 Type 2 diabetes mellitus without complications: Secondary | ICD-10-CM | POA: Diagnosis not present

## 2013-02-07 NOTE — Telephone Encounter (Signed)
Refill due on 02/13/2013.  She has an appoinment already scheduled for that date.  Appropriateness of refill will be determined at that visit.  Theoretically, there may no longer be pain associated with her I&D at that time.

## 2013-02-08 DIAGNOSIS — K612 Anorectal abscess: Secondary | ICD-10-CM | POA: Diagnosis not present

## 2013-02-08 DIAGNOSIS — I10 Essential (primary) hypertension: Secondary | ICD-10-CM | POA: Diagnosis not present

## 2013-02-08 DIAGNOSIS — N390 Urinary tract infection, site not specified: Secondary | ICD-10-CM | POA: Diagnosis not present

## 2013-02-08 DIAGNOSIS — E119 Type 2 diabetes mellitus without complications: Secondary | ICD-10-CM | POA: Diagnosis not present

## 2013-02-08 DIAGNOSIS — I509 Heart failure, unspecified: Secondary | ICD-10-CM | POA: Diagnosis not present

## 2013-02-13 ENCOUNTER — Ambulatory Visit: Payer: Medicare Other | Admitting: Internal Medicine

## 2013-02-13 ENCOUNTER — Other Ambulatory Visit: Payer: Self-pay | Admitting: *Deleted

## 2013-02-13 DIAGNOSIS — I10 Essential (primary) hypertension: Secondary | ICD-10-CM | POA: Diagnosis not present

## 2013-02-13 DIAGNOSIS — K612 Anorectal abscess: Secondary | ICD-10-CM | POA: Diagnosis not present

## 2013-02-13 DIAGNOSIS — I509 Heart failure, unspecified: Secondary | ICD-10-CM | POA: Diagnosis not present

## 2013-02-13 DIAGNOSIS — L0231 Cutaneous abscess of buttock: Secondary | ICD-10-CM

## 2013-02-13 DIAGNOSIS — E119 Type 2 diabetes mellitus without complications: Secondary | ICD-10-CM | POA: Diagnosis not present

## 2013-02-13 DIAGNOSIS — N390 Urinary tract infection, site not specified: Secondary | ICD-10-CM | POA: Diagnosis not present

## 2013-02-13 NOTE — Telephone Encounter (Signed)
Pt has an appt 02/22/13. Daughter states refill was due 12/22.

## 2013-02-14 ENCOUNTER — Other Ambulatory Visit: Payer: Self-pay | Admitting: Internal Medicine

## 2013-02-14 DIAGNOSIS — L0231 Cutaneous abscess of buttock: Secondary | ICD-10-CM

## 2013-02-14 DIAGNOSIS — N390 Urinary tract infection, site not specified: Secondary | ICD-10-CM | POA: Diagnosis not present

## 2013-02-14 DIAGNOSIS — E119 Type 2 diabetes mellitus without complications: Secondary | ICD-10-CM | POA: Diagnosis not present

## 2013-02-14 DIAGNOSIS — I509 Heart failure, unspecified: Secondary | ICD-10-CM | POA: Diagnosis not present

## 2013-02-14 DIAGNOSIS — K612 Anorectal abscess: Secondary | ICD-10-CM | POA: Diagnosis not present

## 2013-02-14 DIAGNOSIS — I10 Essential (primary) hypertension: Secondary | ICD-10-CM | POA: Diagnosis not present

## 2013-02-14 MED ORDER — HYDROCODONE-ACETAMINOPHEN 5-325 MG PO TABS: 1.0000 | ORAL_TABLET | Freq: Four times a day (QID) | ORAL | Status: DC | PRN

## 2013-02-14 MED ORDER — HYDROCODONE-ACETAMINOPHEN 5-325 MG PO TABS: 1.0000 | ORAL_TABLET | Freq: Two times a day (BID) | ORAL | Status: DC | PRN

## 2013-02-14 NOTE — Telephone Encounter (Signed)
Rx ready - pt's daughter called, no answer, message left.

## 2013-02-17 DIAGNOSIS — N390 Urinary tract infection, site not specified: Secondary | ICD-10-CM | POA: Diagnosis not present

## 2013-02-17 DIAGNOSIS — K612 Anorectal abscess: Secondary | ICD-10-CM | POA: Diagnosis not present

## 2013-02-17 DIAGNOSIS — I509 Heart failure, unspecified: Secondary | ICD-10-CM | POA: Diagnosis not present

## 2013-02-17 DIAGNOSIS — Z48815 Encounter for surgical aftercare following surgery on the digestive system: Secondary | ICD-10-CM | POA: Diagnosis not present

## 2013-02-17 DIAGNOSIS — E119 Type 2 diabetes mellitus without complications: Secondary | ICD-10-CM | POA: Diagnosis not present

## 2013-02-17 DIAGNOSIS — I10 Essential (primary) hypertension: Secondary | ICD-10-CM | POA: Diagnosis not present

## 2013-02-21 DIAGNOSIS — N390 Urinary tract infection, site not specified: Secondary | ICD-10-CM | POA: Diagnosis not present

## 2013-02-21 DIAGNOSIS — I509 Heart failure, unspecified: Secondary | ICD-10-CM | POA: Diagnosis not present

## 2013-02-21 DIAGNOSIS — Z48815 Encounter for surgical aftercare following surgery on the digestive system: Secondary | ICD-10-CM | POA: Diagnosis not present

## 2013-02-21 DIAGNOSIS — I10 Essential (primary) hypertension: Secondary | ICD-10-CM | POA: Diagnosis not present

## 2013-02-21 DIAGNOSIS — K612 Anorectal abscess: Secondary | ICD-10-CM | POA: Diagnosis not present

## 2013-02-21 DIAGNOSIS — E119 Type 2 diabetes mellitus without complications: Secondary | ICD-10-CM | POA: Diagnosis not present

## 2013-02-22 ENCOUNTER — Ambulatory Visit (INDEPENDENT_AMBULATORY_CARE_PROVIDER_SITE_OTHER): Payer: Medicare Other | Admitting: Internal Medicine

## 2013-02-22 ENCOUNTER — Encounter: Payer: Self-pay | Admitting: Internal Medicine

## 2013-02-22 VITALS — BP 130/80 | HR 74 | Temp 98.9°F | Wt 194.1 lb

## 2013-02-22 DIAGNOSIS — N179 Acute kidney failure, unspecified: Secondary | ICD-10-CM | POA: Diagnosis not present

## 2013-02-22 DIAGNOSIS — E119 Type 2 diabetes mellitus without complications: Secondary | ICD-10-CM | POA: Diagnosis not present

## 2013-02-22 MED ORDER — INSULIN ASPART 100 UNIT/ML ~~LOC~~ SOLN: 1.0000 [IU] | Freq: Two times a day (BID) | SUBCUTANEOUS | Status: DC

## 2013-02-22 MED ORDER — INSULIN GLARGINE 100 UNIT/ML SOLOSTAR PEN: 15.0000 [IU] | PEN_INJECTOR | Freq: Every day | SUBCUTANEOUS | Status: DC

## 2013-02-22 NOTE — Patient Instructions (Signed)
It was nice to meet you today and hope you have a happy new year.   For your diabetes:   -increase your lantus to 15 units at bedtime -check your blood sugar before you eat breakfast - then at lunch check your blood sugar before you eat and follow the scale below -do the same for dinner check your sugar before you eat and follow the scale -follow up with you in 1 month to see how you are doing   Scale:   Sugar level:  below 70             NO extra insulin  70-130               No extra insulin  131-180             1 Unit  181- 240             2 Unit  241- 300             3 Unit  301- 350              4 Unit  351- 400              5 Unit  Greater than 400       6 Unit

## 2013-02-22 NOTE — Progress Notes (Signed)
   Subjective:    Patient ID: [name], female    DOB: [birth year], 80 y.o.   MRN: [id number]  HPI [name] is a 80 yo woman pmh as listed below presents for DM management.   Patient states that she's had some recent elevations in her CBG readings as high as the 4 to 500s she has not had anything below 100. She brings in her meter today that her daughter keeps and checks her blood sugars at least 4-6 times a day. Per review of the downloaded reading it appears that preprandial is usually around 200 but the daughter states that this is usually 20-30 minutes after meal and the lowest reading is 175 and the highest being 400. The patient has not described any hypoglycemic episodes does not necessarily feel she's having polydipsia or polyuria at this time. The patient has no other complaints in terms of subjective weight loss or weight gain or fevers.  In terms of her recent hospitalization for a significant buttock abscess I&D she states that it is fully resolved at this time along with the rash that subsequently developed and is not having any new eruptions of pustules or rashes.   Review of Systems  Constitutional: Negative for fever, chills and fatigue.  HENT: Negative for congestion and rhinorrhea.   Respiratory: Negative for cough and shortness of breath.   Cardiovascular: Negative for chest pain.  Gastrointestinal: Negative for nausea, abdominal pain and diarrhea.  Endocrine: Negative for polydipsia and polyuria.  Genitourinary: Negative for dysuria.  Musculoskeletal: Negative for arthralgias and myalgias.    Past Medical History  Diagnosis Date  . Diabetes mellitus 2007    HgA1C (02/20/2010) = 9.2, HgA1C (03/20/2009) = 12.1  . Hyperlipidemia   . Hypertension   . GERD (gastroesophageal reflux disease)   . CVA (cerebrovascular accident) 6063     right embolic stroke, no residual deficits  . Diverticulitis   . CVA (cerebral infarction) 7-yrs ago  . Arthritis   . Dysphagia   .  CHF (congestive heart failure)   . DIABETES MELLITUS, TYPE II 12/04/2005  . HYPERLIPIDEMIA 12/04/2005  . HYPERTENSION 12/04/2005  . GERD 12/04/2005  . ARTHRITIS, KNEE 03/24/2006  . Lung mass 07/08/2010  . Meningioma 07/21/2010  . Hemangioma of liver 12/02/2010  . Depression 12/02/2010  . Adenocarcinoma of lung      Right upper lobe adenocarcinoma.    Social, family, surgical history were reviewed.    Objective:   Physical Exam Filed Vitals:   02/22/13 1543  BP: 130/80  Pulse: 74  Temp: 98.9 F (37.2 C)    General: In wheelchair, in no acute distress HEENT: PERRL, EOMI, no scleral icterus Cardiac: RRR, no rubs, murmurs or gallops Pulm: clear to auscultation bilaterally, moving normal volumes of air Abd: soft, nontender, nondistended, BS present Ext: warm and well perfused, no pedal edema Neuro: alert and oriented X3, cranial nerves II-XII grossly intact    Assessment & Plan:  Please see problem oriented charting.  Patient discussed with Dr. Lynnae January

## 2013-02-23 DIAGNOSIS — N390 Urinary tract infection, site not specified: Secondary | ICD-10-CM | POA: Diagnosis not present

## 2013-02-23 DIAGNOSIS — I10 Essential (primary) hypertension: Secondary | ICD-10-CM | POA: Diagnosis not present

## 2013-02-23 DIAGNOSIS — E119 Type 2 diabetes mellitus without complications: Secondary | ICD-10-CM | POA: Diagnosis not present

## 2013-02-23 DIAGNOSIS — I509 Heart failure, unspecified: Secondary | ICD-10-CM | POA: Diagnosis not present

## 2013-02-23 DIAGNOSIS — K612 Anorectal abscess: Secondary | ICD-10-CM | POA: Diagnosis not present

## 2013-02-23 DIAGNOSIS — Z48815 Encounter for surgical aftercare following surgery on the digestive system: Secondary | ICD-10-CM | POA: Diagnosis not present

## 2013-02-23 NOTE — Assessment & Plan Note (Signed)
Although this was not directly addressed during the clinic visit per chart review that during patient's hospitalization back in 12/14 she had significant AK I. that was resolving but BP followup beam that was never performed to make sure and validate that creatinine function had returned to baseline. -Obtained bmet with followup visit in one month

## 2013-02-23 NOTE — Progress Notes (Signed)
Case discussed with Dr. Sadek soon after the resident saw the patient.  We reviewed the resident's history and exam and pertinent patient test results.  I agree with the assessment, diagnosis, and plan of care documented in the resident's note. 

## 2013-02-23 NOTE — Assessment & Plan Note (Signed)
Pt most recent hemoglobin A1c was 12.1 back in 11/14. Per review of patient's downloaded meter it appears that she continues to still have uncontrolled diabetes despite being compliant with her Lantus. The patient states that she no longer takes oral agents do to GI side effects that are intolerable to the patient extensive discussion in terms of starting a different oral agent outside of metformin was had and the patient would prefer to add subsequent short acting insulin. -Increased basal Lantus from 10 units each bedtime to 15 units each bedtime -Start sensitive sliding scale NovoLog this was provided to the patient -Followup in one month for titration -Patient is up to date with other diabetes quality measures

## 2013-02-24 ENCOUNTER — Emergency Department (HOSPITAL_COMMUNITY)
Admission: EM | Admit: 2013-02-24 | Discharge: 2013-02-24 | Disposition: A | Discharge status: Home / Self Care | Payer: Medicare Other | Attending: Emergency Medicine | Admitting: Emergency Medicine

## 2013-02-24 ENCOUNTER — Encounter (HOSPITAL_COMMUNITY): Payer: Self-pay | Admitting: Emergency Medicine

## 2013-02-24 ENCOUNTER — Telehealth: Payer: Self-pay | Admitting: Internal Medicine

## 2013-02-24 DIAGNOSIS — IMO0002 Reserved for concepts with insufficient information to code with codable children: Secondary | ICD-10-CM

## 2013-02-24 DIAGNOSIS — Z8673 Personal history of transient ischemic attack (TIA), and cerebral infarction without residual deficits: Secondary | ICD-10-CM | POA: Insufficient documentation

## 2013-02-24 DIAGNOSIS — E876 Hypokalemia: Secondary | ICD-10-CM | POA: Insufficient documentation

## 2013-02-24 DIAGNOSIS — Z7982 Long term (current) use of aspirin: Secondary | ICD-10-CM | POA: Insufficient documentation

## 2013-02-24 DIAGNOSIS — E119 Type 2 diabetes mellitus without complications: Secondary | ICD-10-CM | POA: Diagnosis not present

## 2013-02-24 DIAGNOSIS — D649 Anemia, unspecified: Secondary | ICD-10-CM | POA: Diagnosis not present

## 2013-02-24 DIAGNOSIS — R7309 Other abnormal glucose: Secondary | ICD-10-CM | POA: Diagnosis not present

## 2013-02-24 DIAGNOSIS — K219 Gastro-esophageal reflux disease without esophagitis: Secondary | ICD-10-CM | POA: Insufficient documentation

## 2013-02-24 DIAGNOSIS — Z8659 Personal history of other mental and behavioral disorders: Secondary | ICD-10-CM | POA: Insufficient documentation

## 2013-02-24 DIAGNOSIS — R739 Hyperglycemia, unspecified: Secondary | ICD-10-CM

## 2013-02-24 DIAGNOSIS — Z794 Long term (current) use of insulin: Secondary | ICD-10-CM | POA: Insufficient documentation

## 2013-02-24 DIAGNOSIS — Z85118 Personal history of other malignant neoplasm of bronchus and lung: Secondary | ICD-10-CM | POA: Insufficient documentation

## 2013-02-24 DIAGNOSIS — Z87891 Personal history of nicotine dependence: Secondary | ICD-10-CM | POA: Insufficient documentation

## 2013-02-24 DIAGNOSIS — D320 Benign neoplasm of cerebral meninges: Secondary | ICD-10-CM | POA: Diagnosis not present

## 2013-02-24 DIAGNOSIS — G40909 Epilepsy, unspecified, not intractable, without status epilepticus: Secondary | ICD-10-CM | POA: Diagnosis not present

## 2013-02-24 DIAGNOSIS — I10 Essential (primary) hypertension: Secondary | ICD-10-CM | POA: Insufficient documentation

## 2013-02-24 DIAGNOSIS — Z79899 Other long term (current) drug therapy: Secondary | ICD-10-CM | POA: Insufficient documentation

## 2013-02-24 DIAGNOSIS — M1710 Unilateral primary osteoarthritis, unspecified knee: Secondary | ICD-10-CM | POA: Insufficient documentation

## 2013-02-24 DIAGNOSIS — I509 Heart failure, unspecified: Secondary | ICD-10-CM | POA: Insufficient documentation

## 2013-02-24 DIAGNOSIS — R7301 Impaired fasting glucose: Secondary | ICD-10-CM | POA: Diagnosis not present

## 2013-02-24 DIAGNOSIS — E785 Hyperlipidemia, unspecified: Secondary | ICD-10-CM | POA: Insufficient documentation

## 2013-02-24 LAB — COMPREHENSIVE METABOLIC PANEL
ALK PHOS: 82 U/L (ref 39–117)
ALT: 9 U/L (ref 0–35)
AST: 10 U/L (ref 0–37)
Albumin: 3.5 g/dL (ref 3.5–5.2)
BUN: 13 mg/dL (ref 6–23)
CALCIUM: 8.3 mg/dL — AB (ref 8.4–10.5)
CO2: 27 meq/L (ref 19–32)
Chloride: 94 mEq/L — ABNORMAL LOW (ref 96–112)
Creatinine, Ser: 1.2 mg/dL — ABNORMAL HIGH (ref 0.50–1.10)
GFR calc Af Amer: 49 mL/min — ABNORMAL LOW (ref >90)
GFR, EST NON AFRICAN AMERICAN: 42 mL/min — AB (ref >90)
Glucose, Bld: 493 mg/dL — ABNORMAL HIGH (ref 70–99)
POTASSIUM: 3 meq/L — AB (ref 3.7–5.3)
SODIUM: 137 meq/L (ref 137–147)
TOTAL PROTEIN: 7.5 g/dL (ref 6.0–8.3)
Total Bilirubin: <0.2 mg/dL — ABNORMAL LOW (ref 0.3–1.2)

## 2013-02-24 LAB — GLUCOSE, CAPILLARY
GLUCOSE-CAPILLARY: 430 mg/dL — AB (ref 70–99)
Glucose-Capillary: 508 mg/dL — ABNORMAL HIGH (ref 70–99)
Glucose-Capillary: 361 mg/dL — ABNORMAL HIGH (ref 70–99)
Glucose-Capillary: 310 mg/dL — ABNORMAL HIGH (ref 70–99)

## 2013-02-24 LAB — CBC
HCT: 36.4 % (ref 36.0–46.0)
HEMOGLOBIN: 11.6 g/dL — AB (ref 12.0–15.0)
MCH: 24.8 pg — ABNORMAL LOW (ref 26.0–34.0)
MCHC: 31.9 g/dL (ref 30.0–36.0)
MCV: 77.8 fL — ABNORMAL LOW (ref 78.0–100.0)
PLATELETS: 238 10*3/uL (ref 150–400)
RBC: 4.68 MIL/uL (ref 3.87–5.11)
RDW: 15.7 % — ABNORMAL HIGH (ref 11.5–15.5)
WBC: 6 10*3/uL (ref 4.0–10.5)

## 2013-02-24 LAB — URINALYSIS, ROUTINE W REFLEX MICROSCOPIC
BILIRUBIN URINE: NEGATIVE
Glucose, UA: >1000 mg/dL — AB
HGB URINE DIPSTICK: NEGATIVE
KETONES UR: NEGATIVE mg/dL
Nitrite: NEGATIVE
PH: 6 (ref 5.0–8.0)
Protein, ur: NEGATIVE mg/dL
SPECIFIC GRAVITY, URINE: 1.022 (ref 1.005–1.030)
Urobilinogen, UA: 0.2 mg/dL (ref 0.0–1.0)

## 2013-02-24 LAB — URINE MICROSCOPIC-ADD ON

## 2013-02-24 MED ORDER — POTASSIUM CHLORIDE CRYS ER 20 MEQ PO TBCR
40.0000 meq | EXTENDED_RELEASE_TABLET | Freq: Once | ORAL | Status: AC
  Administered 2013-02-24: 40 meq via ORAL
  Filled 2013-02-24: qty 2

## 2013-02-24 MED ORDER — SODIUM CHLORIDE 0.9 % IV SOLN
INTRAVENOUS | Status: DC
  Filled 2013-02-24: qty 1

## 2013-02-24 MED ORDER — SODIUM CHLORIDE 0.9 % IV BOLUS (SEPSIS)
500.0000 mL | Freq: Once | INTRAVENOUS | Status: AC
  Administered 2013-02-24: 500 mL via INTRAVENOUS

## 2013-02-24 NOTE — Telephone Encounter (Addendum)
Patient's daughter paged the on-call resident. She explains that her mother just left the ED 30 minutes ago but had 2 other episodes of staring and turning her head to the side. These episodes lasted for 10-20 seconds, accompanied by head turning to the left and loss of orientation and speech with spontaneous return of speech and orientation, and patient unawareness of such events. She is concerned that her mother is having seizures or TIA as she has history of CVA. She states that her mother never had these episodes before today. The daughter also states that her mother has had recent increased urinary frequency.   I advised the patient's daughter to bring her mother back to the ED for further evaluation of these symptoms as well as her possible UTI. The daughter verbalized understabding and agreed with this plan.

## 2013-02-24 NOTE — ED Notes (Signed)
Pt's daughters take care of pt and help her with medication administration. States that pt's blood sugar has been elevated for past month. Went to PCP on January 6th and had Novolog added to her medication regimen. Daughters state that they have not been able to get the insulin yet. Took pt's CBG this am and it was in the 550's range. Called EMS and EMS found pt's CBG to be in the 580's. Pt alert and oriented x 3, neuro intact. Pt is very hard of hearing. No diabetic wounds noted on body.

## 2013-02-24 NOTE — ED Provider Notes (Signed)
CSN: [id number]     Arrival date & time 02/24/13  1545 History   First MD Initiated Contact with Patient 02/24/13 1630     Chief Complaint  Patient presents with  . Hyperglycemia   (Consider location/radiation/quality/duration/timing/severity/associated sxs/prior Treatment) HPI  This is a 80 year old female with a past history of uncontrolled diabetes type 2, history of CVA, CHF, hypertension, previous history of adenocarcinoma of the lung.  Patient was admitted 01/19/2013 for incision and drainage of perirectal abscess.  Patient is brought in today by her daughter for chief complaint of hyperglycemia and unresponsive behavior appear daughter states that the patient has had uncontrolled blood sugars in the 400s for many years, however today her posterior was above 500.  She also states that she frequently checks on her by walking by her room and talking to her.  She states today when she talked to her mother she just stared likely into space and would not respond.  Patient states she "didn't feel like talking to her."  She had a second episode here in the ED witnessed by the nurse. Patient state "i didn't hear her." Denies fevers, chills, myalgias, arthralgias. Denies DOE, SOB, chest tightness or pressure, radiation to left arm, jaw or back, or diaphoresis. Denies dysuria, flank pain, suprapubic pain, frequency, urgency, or hematuria. Denies headaches, light headedness, weakness, visual disturbances. Denies abdominal pain, nausea, vomiting, diarrhea or constipation. Wound is healing well.   Past Medical History  Diagnosis Date  . Diabetes mellitus 2007    HgA1C (02/20/2010) = 9.2, HgA1C (03/20/2009) = 12.1  . Hyperlipidemia   . Hypertension   . GERD (gastroesophageal reflux disease)   . CVA (cerebrovascular accident) 2423     right embolic stroke, no residual deficits  . Diverticulitis   . CVA (cerebral infarction) 7-yrs ago  . Arthritis   . Dysphagia   . CHF (congestive heart failure)    . DIABETES MELLITUS, TYPE II 12/04/2005  . HYPERLIPIDEMIA 12/04/2005  . HYPERTENSION 12/04/2005  . GERD 12/04/2005  . ARTHRITIS, KNEE 03/24/2006  . Lung mass 07/08/2010  . Meningioma 07/21/2010  . Hemangioma of liver 12/02/2010  . Depression 12/02/2010  . Adenocarcinoma of lung      Right upper lobe adenocarcinoma.    Past Surgical History  Procedure Laterality Date  . Abdominal hysterectomy    . Video bronchoscope.  12/29/2007    [facility]  . Wide excision of left upper back mass.    . Extracapsular cataract extraction with intraocular      lens implantation.  . Right vats,right thoracotomy,right lower lobectomy with node dissection    . Incision and drainage perirectal abscess N/A 01/19/2013    Procedure: IRRIGATION AND DEBRIDEMENT PERIRECTAL ABSCESS;  Surgeon: Zenovia Jarred, MD;  Location: [facility];  Service: General;  Laterality: N/A;   Family History  Problem Relation Age of Onset  . Hyperlipidemia Brother   . Hypertension Brother   . Diabetes Brother    History  Substance Use Topics  . Smoking status: Former Smoker    Types: Cigarettes    Quit date: 02/17/2000  . Smokeless tobacco: Never Used  . Alcohol Use: No   OB History   Grav Para Term Preterm Abortions TAB SAB Ect Mult Living                 Review of Systems Ten systems reviewed and are negative for acute change, except as noted in the HPI.   Allergies  Review of patient's allergies indicates no known  allergies.  Home Medications   Current Outpatient Rx  Name  Route  Sig  Dispense  Refill  . albuterol (PROVENTIL HFA;VENTOLIN HFA) 108 (90 BASE) MCG/ACT inhaler   Inhalation   Inhale 2 puffs into the lungs every 4 (four) hours as needed for wheezing.   3 Inhaler   1   . albuterol (PROVENTIL) (2.5 MG/3ML) 0.083% nebulizer solution   Nebulization   Take 3 mLs (2.5 mg total) by nebulization every 6 (six) hours as needed for wheezing.   75 mL   12   . amLODipine (NORVASC) 10 MG tablet   Oral   Take 1  tablet (10 mg total) by mouth daily.   30 tablet   3   . aspirin 81 MG tablet   Oral   Take 81 mg by mouth daily.          . Blood Glucose Monitoring Suppl (ACCU-CHEK AVIVA PLUS) W/DEVICE KIT   Does not apply   1 each by Does not apply route 3 (three) times daily. dx code 250.00 insulin requiring   1 kit   1   . carvedilol (COREG) 25 MG tablet   Oral   Take 1 tablet (25 mg total) by mouth 2 (two) times daily with a meal.   180 tablet   4   . [facility] LANCETS MICRO THIN 33G MISC      Check blood sugar 3 times a day before meals dx code 250.00 insulin requiring   100 each   12   . furosemide (LASIX) 20 MG tablet   Oral   Take 1 tablet (20 mg total) by mouth daily.   90 tablet   0   . glucose blood (ACCU-CHEK AVIVA PLUS) test strip      Check blood sugar 3 times a day before meals dx code 250.00 insulin requiring   100 each   12   . HYDROcodone-acetaminophen (NORCO/VICODIN) 5-325 MG per tablet   Oral   Take 1 tablet by mouth 2 (two) times daily as needed for moderate pain. Do not fill before 04/16/2013   60 tablet   0     Do not fill before 04/16/13   . insulin aspart (NOVOLOG) 100 UNIT/ML injection   Subcutaneous   Inject 1-5 Units into the skin 2 (two) times daily before lunch and supper.   10 mL   11   . Insulin Glargine (LANTUS SOLOSTAR) 100 UNIT/ML Solostar Pen   Subcutaneous   Inject 15 Units into the skin at bedtime.   10 mL   5   . Loratadine 10 MG CAPS   Oral   Take 1 capsule (10 mg total) by mouth daily.   30 each   0   . pantoprazole (PROTONIX) 20 MG tablet   Oral   Take 20 mg by mouth daily.         . simvastatin (ZOCOR) 20 MG tablet   Oral   Take 20 mg by mouth daily.         Marland Kitchen triamcinolone ointment (KENALOG) 0.1 %   Topical   Apply 1 application topically 2 (two) times daily.   30 g   0   . Lancet Devices (ACCU-CHEK SOFTCLIX) lancets      Check blood sugar 3 times a day before meals dx code 250.00 insulin requiring   100 each    12    BP 168/87  Pulse 82  Temp(Src) 97.5 F (36.4 C) (Oral)  Resp 22  Ht _0  (1.626 m)  Wt 194 lb 1 oz (88.026 kg)  BMI 33.29 kg/m2  SpO2 100% Physical Exam  Constitutional: She is oriented to person, place, and time. She appears well-developed and well-nourished. No distress.  HENT:  Head: Normocephalic and atraumatic.  Eyes: Conjunctivae are normal. No scleral icterus.  Neck: Normal range of motion.  Cardiovascular: Normal rate, regular rhythm and normal heart sounds.  Exam reveals no gallop and no friction rub.   No murmur heard. Pulmonary/Chest: Effort normal and breath sounds normal. No respiratory distress.  Abdominal: Soft. Bowel sounds are normal. She exhibits no distension and no mass. There is no tenderness. There is no guarding.  Musculoskeletal: Normal range of motion.  Neurological: She is alert and oriented to person, place, and time.  Speech is clear and goal oriented, follows commands Major Cranial nerves without deficit, no facial droop Normal strength in upper and lower extremities bilaterally including dorsiflexion and plantar flexion, strong and equal grip strength Sensation normal to light and sharp touch Moves extremities without ataxia, coordination intact Normal finger to nose and rapid alternating movements   Skin: Skin is warm and dry. She is not diaphoretic.  Well healing wound in gluteal cleft. No signs of infection    ED Course  Procedures (including critical care time) Labs Review Labs Reviewed  GLUCOSE, CAPILLARY - Abnormal; Notable for the following:    Glucose-Capillary 508 (*)    All other components within normal limits  CBC  COMPREHENSIVE METABOLIC PANEL  URINALYSIS, ROUTINE W REFLEX MICROSCOPIC   Imaging Review No results found.  EKG Interpretation   None       MDM   1. Hyperglycemia   2. Hypokalemia    Patient gives conflicting history during interview that is disputed by the family. Family denies witnessing any  signs of stroke. Patient   6:02 PM BP 168/87  Pulse 82  Temp(Src) 97.5 F (36.4 C) (Oral)  Resp 22  Ht _1  (1.626 m)  Wt 194 lb 1 oz (88.026 kg)  BMI 33.29 kg/m2  SpO2 100% Patient with gap of 16. Creatinine is improving.She has hypokalemia. Patient has been prescribed Novolog but has not yet picked it up from the pharmacy.  8:08 PM Patient seen in shared visit with dr. Jeanell Sparrow. Do not feel that she has altered mental status. Her urine shows pyuria, but is equivocal for infection. Do not feel she needs further workup as there is no evidence of AMS. Patient with advised to follow closely with her PCP and use her medications as directed.  Margarita Mail, PA-C 02/25/13 1909

## 2013-02-24 NOTE — Discharge Instructions (Signed)
Please take your insulin as prescribed.  Hyperglycemia Hyperglycemia occurs when the glucose (sugar) in your blood is too high. Hyperglycemia can happen for many reasons, but it most often happens to people who do not know they have diabetes or are not managing their diabetes properly.  CAUSES  Whether you have diabetes or not, there are other causes of hyperglycemia. Hyperglycemia can occur when you have diabetes, but it can also occur in other situations that you might not be as aware of, such as: Diabetes  If you have diabetes and are having problems controlling your blood glucose, hyperglycemia could occur because of some of the following reasons:  Not following your meal plan.  Not taking your diabetes medications or not taking it properly.  Exercising less or doing less activity than you normally do.  Being sick. Pre-diabetes  This cannot be ignored. Before people develop Type 2 diabetes, they almost always have "pre-diabetes." This is when your blood glucose levels are higher than normal, but not yet high enough to be diagnosed as diabetes. Research has shown that some long-term damage to the body, especially the heart and circulatory system, may already be occurring during pre-diabetes. If you take action to manage your blood glucose when you have pre-diabetes, you may delay or prevent Type 2 diabetes from developing. Stress  If you have diabetes, you may be "diet" controlled or on oral medications or insulin to control your diabetes. However, you may find that your blood glucose is higher than usual in the hospital whether you have diabetes or not. This is often referred to as "stress hyperglycemia." Stress can elevate your blood glucose. This happens because of hormones put out by the body during times of stress. If stress has been the cause of your high blood glucose, it can be followed regularly by your caregiver. That way he/she can make sure your hyperglycemia does not continue to  get worse or progress to diabetes. Steroids  Steroids are medications that act on the infection fighting system (immune system) to block inflammation or infection. One side effect can be a rise in blood glucose. Most people can produce enough extra insulin to allow for this rise, but for those who cannot, steroids make blood glucose levels go even higher. It is not unusual for steroid treatments to "uncover" diabetes that is developing. It is not always possible to determine if the hyperglycemia will go away after the steroids are stopped. A special blood test called an A1c is sometimes done to determine if your blood glucose was elevated before the steroids were started. SYMPTOMS  Thirsty.  Frequent urination.  Dry mouth.  Blurred vision.  Tired or fatigue.  Weakness.  Sleepy.  Tingling in feet or leg. DIAGNOSIS  Diagnosis is made by monitoring blood glucose in one or all of the following ways:  A1c test. This is a chemical found in your blood.  Fingerstick blood glucose monitoring.  Laboratory results. TREATMENT  First, knowing the cause of the hyperglycemia is important before the hyperglycemia can be treated. Treatment may include, but is not be limited to:  Education.  Change or adjustment in medications.  Change or adjustment in meal plan.  Treatment for an illness, infection, etc.  More frequent blood glucose monitoring.  Change in exercise plan.  Decreasing or stopping steroids.  Lifestyle changes. HOME CARE INSTRUCTIONS   Test your blood glucose as directed.  Exercise regularly. Your caregiver will give you instructions about exercise. Pre-diabetes or diabetes which comes on with stress  is helped by exercising.  Eat wholesome, balanced meals. Eat often and at regular, fixed times. Your caregiver or nutritionist will give you a meal plan to guide your sugar intake.  Being at an ideal weight is important. If needed, losing as little as 10 to 15 pounds may  help improve blood glucose levels. SEEK MEDICAL CARE IF:   You have questions about medicine, activity, or diet.  You continue to have symptoms (problems such as increased thirst, urination, or weight gain). SEEK IMMEDIATE MEDICAL CARE IF:   You are vomiting or have diarrhea.  Your breath smells fruity.  You are breathing faster or slower.  You are very sleepy or incoherent.  You have numbness, tingling, or pain in your feet or hands.  You have chest pain.  Your symptoms get worse even though you have been following your caregiver's orders.  If you have any other questions or concerns. Document Released: 07/29/2000 Document Revised: 04/27/2011 Document Reviewed: 06/01/2011 [facility] Patient Information 2014 [place], [place].  Correction Insulin Your health care provider has decided you need to take insulin regularly. You have been given a correction scale (also called a sliding scale) in case you need extra insulin when your blood sugar is too high (hyperglycemia). The following instructions will assist you in how to use that correction scale.  WHAT IS A CORRECTION SCALE?  When you check your blood sugar, sometimes it will be higher than your health care provider has told you it should be. You may need an extra dose of insulin to bring your blood sugar to the recommended level (also known as your goal, target, or normal level). The correction scale is prescribed by your health care provider based on your specific needs.  Your correction scale has two parts:   The first shows you a blood sugar range.   The second part tells you how much extra insulin to give yourself if your blood sugar falls within this range. You will not need an extra dose of insulin if your blood glucose is in the desired range. You should simply give yourself the normal amount of insulin that your health care provider has ordered for you.  WHY IS IT IMPORTANT TO KEEP YOUR BLOOD SUGAR LEVELS AT YOUR DESIRED  LEVEL?  Keeping your blood sugar at the desired level helps to prevent long-term complications of diabetes, such as eye disease, kidney failure, nerve damage, and other serious complications. WHAT TYPE OF INSULIN WILL YOU USE?  To help bring down blood sugar levels that are too high, your health care provider will prescribe a short-acting or a rapid-acting insulin. An example of a short-acting insulin would be regular insulin. Remember, you may also have a longer-acting insulin prescribed for you.  WHAT DO YOU NEED TO DO?   Check your blood sugar with your home blood glucose meter as recommended by your health care provider.   Using your correction scale, find the range that your blood sugar lies in.   Look for the units of insulin that match that blood sugar range. Give yourself the dose of correction insulin your health care provider has prescribed. Always make sure you are using the right type of insulin.   Prior to the injection, make sure you have food available that you can eat in the next 15 30 minutes.   If your correction insulin is rapid acting, start eating your meal within 15 minutes after you have given yourself the insulin injection. If you wait longer than 15 minutes  to eat, your blood sugar might get too low.   If your correction insulin is short acting(regular), start eating your meal within 30 minutes after you have given yourself the insulin injection. If you wait longer than 30 minutes to eat, your blood sugar might get too low. Symptoms of low blood sugar (hypoglycemia) may include feeling shaky or weak, sweating, feeling confused, difficulty seeing, agitation, crankiness, or numbness of the lips or tongue. Check your blood sugar immediately and treat your results as directed by your health care provider.   Keep a log of your blood sugar results with the time you took the test and the amount of insulin that you injected. This information will help your health care  provider manage your medicines.   Note on your log anything that may affect your blood sugar level, such as:   Changes in normal exercise or activity.   Changes in your normal schedule, such as staying up late, going on vacation, changing your diet, or holidays.   New medicines. This includes prescription and over-the-counter medicines. Some medicines may cause high blood sugar.   Sickness, stress, or anxiety.   Changes in the time you took your medicine.   Changes in your meals, such as skipping a meal, having a late meal, or dining out.   Eating things that may affect blood glucose, such as snacks, meal portions that are larger than normal, drinks with sugar, or eating less than usual.   Ask your health care provider any questions you have.  Be aware of "stacking" your insulin doses. This happens when you correct a high blood sugar level by giving yourself extra insulin too soon after a previous correction dose or mealtime dose. You may then have too much insulin still active in your body and may be at risk for hypoglycemia. WHY DO YOU NEED A CORRECTION SCALE IF YOU HAVE NEVER BEEN DIAGNOSED WITH DIABETES?   Keeping your blood glucose in the target range is important for your overall health.   You may have been prescribed medicines that cause your blood glucose to be higher than normal. WHEN SHOULD YOU SEEK MEDICAL CARE? Contact your health care provider if:   You have experienced hypoglycemia that you are unable to treat with your usual routine.   You have a high blood sugar level that is not coming down with the correction dose.  Your blood sugar is often too low or does not come up even if you eat a fast-acting carbohydrate. Someone who lives with you should seek immediate medical care if you become unresponsive. Document Released: 06/26/2010 Document Revised: 10/05/2012 Document Reviewed: 07/15/2012 [facility] Patient Information 2014 [place].

## 2013-02-24 NOTE — ED Notes (Addendum)
Presents with not feeling right for the last few days, CBG over 600. Pt is alert, oriented, speaking in full sentences and answering all questions appropriately. Denies pain. Denies nausea, denies vomiting. Recently placed on new medications for her diabetes. Blood sugar has been running high at home.   Late entry- Pt had episode of staring in space, not responding or moving lasted briefly approx 15 seconds, came back around and was alert and oriented.

## 2013-02-25 ENCOUNTER — Encounter (HOSPITAL_COMMUNITY): Payer: Self-pay | Admitting: Emergency Medicine

## 2013-02-25 ENCOUNTER — Other Ambulatory Visit: Payer: Self-pay

## 2013-02-25 ENCOUNTER — Inpatient Hospital Stay (HOSPITAL_COMMUNITY): Payer: Medicare Other

## 2013-02-25 ENCOUNTER — Inpatient Hospital Stay (HOSPITAL_COMMUNITY)
Admission: EM | Admit: 2013-02-25 | Discharge: 2013-03-07 | DRG: 025 | Disposition: A | Discharge status: Home Health | Payer: Medicare Other | Attending: Internal Medicine | Admitting: Internal Medicine

## 2013-02-25 ENCOUNTER — Emergency Department (HOSPITAL_COMMUNITY): Payer: Medicare Other

## 2013-02-25 DIAGNOSIS — E785 Hyperlipidemia, unspecified: Secondary | ICD-10-CM | POA: Diagnosis present

## 2013-02-25 DIAGNOSIS — R918 Other nonspecific abnormal finding of lung field: Secondary | ICD-10-CM | POA: Diagnosis not present

## 2013-02-25 DIAGNOSIS — D496 Neoplasm of unspecified behavior of brain: Secondary | ICD-10-CM | POA: Diagnosis not present

## 2013-02-25 DIAGNOSIS — T380X5A Adverse effect of glucocorticoids and synthetic analogues, initial encounter: Secondary | ICD-10-CM | POA: Diagnosis not present

## 2013-02-25 DIAGNOSIS — E876 Hypokalemia: Secondary | ICD-10-CM | POA: Diagnosis present

## 2013-02-25 DIAGNOSIS — I10 Essential (primary) hypertension: Secondary | ICD-10-CM | POA: Diagnosis not present

## 2013-02-25 DIAGNOSIS — D72829 Elevated white blood cell count, unspecified: Secondary | ICD-10-CM | POA: Diagnosis not present

## 2013-02-25 DIAGNOSIS — E669 Obesity, unspecified: Secondary | ICD-10-CM | POA: Diagnosis present

## 2013-02-25 DIAGNOSIS — E7800 Pure hypercholesterolemia, unspecified: Secondary | ICD-10-CM | POA: Diagnosis present

## 2013-02-25 DIAGNOSIS — I5022 Chronic systolic (congestive) heart failure: Secondary | ICD-10-CM | POA: Diagnosis not present

## 2013-02-25 DIAGNOSIS — R569 Unspecified convulsions: Secondary | ICD-10-CM | POA: Diagnosis not present

## 2013-02-25 DIAGNOSIS — G936 Cerebral edema: Secondary | ICD-10-CM | POA: Diagnosis present

## 2013-02-25 DIAGNOSIS — Z794 Long term (current) use of insulin: Secondary | ICD-10-CM | POA: Diagnosis not present

## 2013-02-25 DIAGNOSIS — I509 Heart failure, unspecified: Secondary | ICD-10-CM | POA: Diagnosis present

## 2013-02-25 DIAGNOSIS — J9819 Other pulmonary collapse: Secondary | ICD-10-CM | POA: Diagnosis not present

## 2013-02-25 DIAGNOSIS — K219 Gastro-esophageal reflux disease without esophagitis: Secondary | ICD-10-CM | POA: Diagnosis present

## 2013-02-25 DIAGNOSIS — N183 Chronic kidney disease, stage 3 unspecified: Secondary | ICD-10-CM | POA: Diagnosis present

## 2013-02-25 DIAGNOSIS — G40909 Epilepsy, unspecified, not intractable, without status epilepticus: Secondary | ICD-10-CM | POA: Diagnosis not present

## 2013-02-25 DIAGNOSIS — IMO0002 Reserved for concepts with insufficient information to code with codable children: Secondary | ICD-10-CM | POA: Diagnosis present

## 2013-02-25 DIAGNOSIS — R220 Localized swelling, mass and lump, head: Secondary | ICD-10-CM | POA: Diagnosis not present

## 2013-02-25 DIAGNOSIS — Y9210 Unspecified residential institution as the place of occurrence of the external cause: Secondary | ICD-10-CM | POA: Diagnosis not present

## 2013-02-25 DIAGNOSIS — D320 Benign neoplasm of cerebral meninges: Secondary | ICD-10-CM | POA: Diagnosis not present

## 2013-02-25 DIAGNOSIS — Z87891 Personal history of nicotine dependence: Secondary | ICD-10-CM | POA: Diagnosis not present

## 2013-02-25 DIAGNOSIS — R739 Hyperglycemia, unspecified: Secondary | ICD-10-CM

## 2013-02-25 DIAGNOSIS — Z7982 Long term (current) use of aspirin: Secondary | ICD-10-CM | POA: Diagnosis not present

## 2013-02-25 DIAGNOSIS — R7309 Other abnormal glucose: Secondary | ICD-10-CM | POA: Diagnosis not present

## 2013-02-25 DIAGNOSIS — D329 Benign neoplasm of meninges, unspecified: Secondary | ICD-10-CM

## 2013-02-25 DIAGNOSIS — IMO0001 Reserved for inherently not codable concepts without codable children: Secondary | ICD-10-CM

## 2013-02-25 DIAGNOSIS — Z6833 Body mass index (BMI) 33.0-33.9, adult: Secondary | ICD-10-CM

## 2013-02-25 DIAGNOSIS — Z8673 Personal history of transient ischemic attack (TIA), and cerebral infarction without residual deficits: Secondary | ICD-10-CM | POA: Diagnosis not present

## 2013-02-25 DIAGNOSIS — E1065 Type 1 diabetes mellitus with hyperglycemia: Secondary | ICD-10-CM | POA: Diagnosis not present

## 2013-02-25 DIAGNOSIS — E1159 Type 2 diabetes mellitus with other circulatory complications: Secondary | ICD-10-CM | POA: Diagnosis present

## 2013-02-25 DIAGNOSIS — D649 Anemia, unspecified: Secondary | ICD-10-CM | POA: Diagnosis not present

## 2013-02-25 DIAGNOSIS — Z452 Encounter for adjustment and management of vascular access device: Secondary | ICD-10-CM | POA: Diagnosis not present

## 2013-02-25 DIAGNOSIS — E1165 Type 2 diabetes mellitus with hyperglycemia: Secondary | ICD-10-CM

## 2013-02-25 DIAGNOSIS — I152 Hypertension secondary to endocrine disorders: Secondary | ICD-10-CM | POA: Diagnosis present

## 2013-02-25 DIAGNOSIS — D429 Neoplasm of uncertain behavior of meninges, unspecified: Secondary | ICD-10-CM | POA: Diagnosis not present

## 2013-02-25 DIAGNOSIS — E119 Type 2 diabetes mellitus without complications: Secondary | ICD-10-CM | POA: Diagnosis present

## 2013-02-25 DIAGNOSIS — I129 Hypertensive chronic kidney disease with stage 1 through stage 4 chronic kidney disease, or unspecified chronic kidney disease: Secondary | ICD-10-CM | POA: Diagnosis present

## 2013-02-25 DIAGNOSIS — E1140 Type 2 diabetes mellitus with diabetic neuropathy, unspecified: Secondary | ICD-10-CM | POA: Diagnosis present

## 2013-02-25 DIAGNOSIS — D509 Iron deficiency anemia, unspecified: Secondary | ICD-10-CM | POA: Diagnosis present

## 2013-02-25 DIAGNOSIS — N390 Urinary tract infection, site not specified: Secondary | ICD-10-CM | POA: Diagnosis present

## 2013-02-25 DIAGNOSIS — F4489 Other dissociative and conversion disorders: Secondary | ICD-10-CM | POA: Diagnosis not present

## 2013-02-25 LAB — BASIC METABOLIC PANEL
BUN: 13 mg/dL (ref 6–23)
CO2: 29 mEq/L (ref 19–32)
Calcium: 8.4 mg/dL (ref 8.4–10.5)
Chloride: 101 mEq/L (ref 96–112)
Creatinine, Ser: 1.05 mg/dL (ref 0.50–1.10)
GFR calc Af Amer: 58 mL/min — ABNORMAL LOW (ref >90)
GFR, EST NON AFRICAN AMERICAN: 50 mL/min — AB (ref >90)
GLUCOSE: 399 mg/dL — AB (ref 70–99)
Potassium: 3.1 mEq/L — ABNORMAL LOW (ref 3.7–5.3)
SODIUM: 141 meq/L (ref 137–147)

## 2013-02-25 LAB — URINALYSIS, ROUTINE W REFLEX MICROSCOPIC
BILIRUBIN URINE: NEGATIVE
Glucose, UA: >1000 mg/dL — AB
Ketones, ur: NEGATIVE mg/dL
Nitrite: NEGATIVE
PROTEIN: NEGATIVE mg/dL
Specific Gravity, Urine: 1.02 (ref 1.005–1.030)
Urobilinogen, UA: 0.2 mg/dL (ref 0.0–1.0)
pH: 6.5 (ref 5.0–8.0)

## 2013-02-25 LAB — RAPID URINE DRUG SCREEN, HOSP PERFORMED
Amphetamines: NOT DETECTED
BARBITURATES: NOT DETECTED
Benzodiazepines: POSITIVE — AB
COCAINE: NOT DETECTED
Opiates: NOT DETECTED
TETRAHYDROCANNABINOL: NOT DETECTED

## 2013-02-25 LAB — CBC
HCT: 32.7 % — ABNORMAL LOW (ref 36.0–46.0)
Hemoglobin: 10.1 g/dL — ABNORMAL LOW (ref 12.0–15.0)
MCH: 24.3 pg — AB (ref 26.0–34.0)
MCHC: 30.9 g/dL (ref 30.0–36.0)
MCV: 78.6 fL (ref 78.0–100.0)
PLATELETS: 305 10*3/uL (ref 150–400)
RBC: 4.16 MIL/uL (ref 3.87–5.11)
RDW: 15.6 % — AB (ref 11.5–15.5)
WBC: 7.4 10*3/uL (ref 4.0–10.5)

## 2013-02-25 LAB — GLUCOSE, CAPILLARY
GLUCOSE-CAPILLARY: 371 mg/dL — AB (ref 70–99)
GLUCOSE-CAPILLARY: 285 mg/dL — AB (ref 70–99)
GLUCOSE-CAPILLARY: 349 mg/dL — AB (ref 70–99)
Glucose-Capillary: 356 mg/dL — ABNORMAL HIGH (ref 70–99)
Glucose-Capillary: 343 mg/dL — ABNORMAL HIGH (ref 70–99)
Glucose-Capillary: 433 mg/dL — ABNORMAL HIGH (ref 70–99)

## 2013-02-25 LAB — URINE MICROSCOPIC-ADD ON

## 2013-02-25 LAB — TROPONIN I
Troponin I: <0.3 ng/mL (ref <0.30)
Troponin I: <0.3 ng/mL (ref <0.30)

## 2013-02-25 LAB — CK: CK TOTAL: 34 U/L (ref 7–177)

## 2013-02-25 LAB — CG4 I-STAT (LACTIC ACID): LACTIC ACID, VENOUS: 0.89 mmol/L (ref 0.5–2.2)

## 2013-02-25 LAB — MAGNESIUM: Magnesium: 1.5 mg/dL (ref 1.5–2.5)

## 2013-02-25 LAB — MRSA PCR SCREENING: MRSA by PCR: NEGATIVE

## 2013-02-25 MED ORDER — DEXTROSE 5 % IV SOLN
1.0000 g | Freq: Every day | INTRAVENOUS | Status: DC
  Administered 2013-02-25: 1 g via INTRAVENOUS
  Filled 2013-02-25: qty 10

## 2013-02-25 MED ORDER — INSULIN ASPART 100 UNIT/ML ~~LOC~~ SOLN: 1.0000 [IU] | Freq: Two times a day (BID) | SUBCUTANEOUS | Status: DC

## 2013-02-25 MED ORDER — ADULT MULTIVITAMIN W/MINERALS CH
1.0000 | ORAL_TABLET | Freq: Every day | ORAL | Status: DC
  Administered 2013-02-26 – 2013-03-01 (×4): 1 via ORAL
  Filled 2013-02-25 (×5): qty 1

## 2013-02-25 MED ORDER — POTASSIUM CHLORIDE CRYS ER 20 MEQ PO TBCR
40.0000 meq | EXTENDED_RELEASE_TABLET | Freq: Once | ORAL | Status: AC
  Administered 2013-02-25: 40 meq via ORAL
  Filled 2013-02-25: qty 2

## 2013-02-25 MED ORDER — INSULIN ASPART 100 UNIT/ML ~~LOC~~ SOLN
5.0000 [IU] | Freq: Three times a day (TID) | SUBCUTANEOUS | Status: DC
  Administered 2013-02-25 – 2013-02-26 (×4): 5 [IU] via SUBCUTANEOUS

## 2013-02-25 MED ORDER — POTASSIUM CHLORIDE 10 MEQ/100ML IV SOLN
10.0000 meq | Freq: Once | INTRAVENOUS | Status: AC
  Administered 2013-02-25: 10 meq via INTRAVENOUS
  Filled 2013-02-25: qty 100

## 2013-02-25 MED ORDER — AMLODIPINE BESYLATE 10 MG PO TABS
10.0000 mg | ORAL_TABLET | Freq: Every day | ORAL | Status: DC
  Administered 2013-02-25 – 2013-03-01 (×5): 10 mg via ORAL
  Filled 2013-02-25 (×6): qty 1

## 2013-02-25 MED ORDER — MAGNESIUM SULFATE 40 MG/ML IJ SOLN
2.0000 g | Freq: Once | INTRAMUSCULAR | Status: AC
  Administered 2013-02-25: 2 g via INTRAVENOUS

## 2013-02-25 MED ORDER — INSULIN GLARGINE 100 UNIT/ML ~~LOC~~ SOLN
7.0000 [IU] | Freq: Every day | SUBCUTANEOUS | Status: DC
  Filled 2013-02-25: qty 0.07

## 2013-02-25 MED ORDER — HEPARIN SODIUM (PORCINE) 5000 UNIT/ML IJ SOLN
5000.0000 [IU] | Freq: Three times a day (TID) | INTRAMUSCULAR | Status: DC
  Administered 2013-02-25 – 2013-03-01 (×13): 5000 [IU] via SUBCUTANEOUS
  Filled 2013-02-25 (×21): qty 1

## 2013-02-25 MED ORDER — ACETAMINOPHEN 325 MG PO TABS: 650.0000 mg | ORAL_TABLET | Freq: Four times a day (QID) | ORAL | Status: DC | PRN

## 2013-02-25 MED ORDER — SODIUM CHLORIDE 0.9 % IJ SOLN: 3.0000 mL | INTRAMUSCULAR | Status: DC | PRN

## 2013-02-25 MED ORDER — ACETAMINOPHEN 650 MG RE SUPP: 650.0000 mg | Freq: Four times a day (QID) | RECTAL | Status: DC | PRN

## 2013-02-25 MED ORDER — SODIUM CHLORIDE 0.9 % IV SOLN: 250.0000 mL | INTRAVENOUS | Status: DC | PRN

## 2013-02-25 MED ORDER — LEVOFLOXACIN IN D5W 250 MG/50ML IV SOLN
250.0000 mg | INTRAVENOUS | Status: DC
  Filled 2013-02-25: qty 50

## 2013-02-25 MED ORDER — GLUCERNA SHAKE PO LIQD: 237.0000 mL | Freq: Three times a day (TID) | ORAL | Status: DC | PRN

## 2013-02-25 MED ORDER — SODIUM CHLORIDE 0.9 % IV SOLN
1000.0000 mg | Freq: Once | INTRAVENOUS | Status: AC
  Administered 2013-02-25: 1000 mg via INTRAVENOUS
  Filled 2013-02-25: qty 10

## 2013-02-25 MED ORDER — SODIUM CHLORIDE 0.9 % IV SOLN
500.0000 mg | Freq: Two times a day (BID) | INTRAVENOUS | Status: DC
  Administered 2013-02-25 – 2013-02-26 (×3): 500 mg via INTRAVENOUS
  Filled 2013-02-25 (×4): qty 5

## 2013-02-25 MED ORDER — INSULIN GLARGINE 100 UNIT/ML ~~LOC~~ SOLN
15.0000 [IU] | Freq: Every day | SUBCUTANEOUS | Status: DC
  Filled 2013-02-25: qty 0.15

## 2013-02-25 MED ORDER — SODIUM CHLORIDE 0.9 % IJ SOLN
3.0000 mL | Freq: Two times a day (BID) | INTRAMUSCULAR | Status: DC
  Administered 2013-02-25 – 2013-03-06 (×15): 3 mL via INTRAVENOUS

## 2013-02-25 MED ORDER — ONDANSETRON HCL 4 MG/2ML IJ SOLN: 4.0000 mg | Freq: Four times a day (QID) | INTRAMUSCULAR | Status: DC | PRN

## 2013-02-25 MED ORDER — INSULIN ASPART 100 UNIT/ML ~~LOC~~ SOLN
0.0000 [IU] | Freq: Three times a day (TID) | SUBCUTANEOUS | Status: DC
  Administered 2013-02-25: 11 [IU] via SUBCUTANEOUS
  Administered 2013-02-26 (×2): 15 [IU] via SUBCUTANEOUS
  Administered 2013-02-26: 7 [IU] via SUBCUTANEOUS
  Administered 2013-02-27: 11 [IU] via SUBCUTANEOUS
  Administered 2013-02-27: 20 [IU] via SUBCUTANEOUS
  Administered 2013-02-27 – 2013-02-28 (×4): 15 [IU] via SUBCUTANEOUS
  Administered 2013-03-01: 20 [IU] via SUBCUTANEOUS
  Administered 2013-03-01: 7 [IU] via SUBCUTANEOUS

## 2013-02-25 MED ORDER — DEXAMETHASONE SODIUM PHOSPHATE 4 MG/ML IJ SOLN
4.0000 mg | Freq: Four times a day (QID) | INTRAMUSCULAR | Status: DC
  Administered 2013-02-25 – 2013-02-26 (×4): 4 mg via INTRAVENOUS
  Filled 2013-02-25 (×7): qty 1

## 2013-02-25 MED ORDER — ONDANSETRON HCL 4 MG PO TABS: 4.0000 mg | ORAL_TABLET | Freq: Four times a day (QID) | ORAL | Status: DC | PRN

## 2013-02-25 MED ORDER — INSULIN ASPART 100 UNIT/ML ~~LOC~~ SOLN
0.0000 [IU] | SUBCUTANEOUS | Status: DC
  Administered 2013-02-25: 9 [IU] via SUBCUTANEOUS
  Administered 2013-02-25: 7 [IU] via SUBCUTANEOUS

## 2013-02-25 MED ORDER — CARVEDILOL 25 MG PO TABS
25.0000 mg | ORAL_TABLET | Freq: Two times a day (BID) | ORAL | Status: DC
  Administered 2013-02-25 – 2013-03-02 (×10): 25 mg via ORAL
  Filled 2013-02-25 (×12): qty 1

## 2013-02-25 MED ORDER — GADOBENATE DIMEGLUMINE 529 MG/ML IV SOLN
20.0000 mL | Freq: Once | INTRAVENOUS | Status: AC
  Administered 2013-02-25: 18 mL via INTRAVENOUS

## 2013-02-25 MED ORDER — INSULIN GLARGINE 100 UNIT/ML ~~LOC~~ SOLN
15.0000 [IU] | Freq: Every day | SUBCUTANEOUS | Status: DC
  Administered 2013-02-25: 15 [IU] via SUBCUTANEOUS
  Filled 2013-02-25: qty 0.15

## 2013-02-25 MED ORDER — PANTOPRAZOLE SODIUM 40 MG IV SOLR
40.0000 mg | INTRAVENOUS | Status: DC
  Administered 2013-02-25 – 2013-02-26 (×2): 40 mg via INTRAVENOUS
  Filled 2013-02-25 (×3): qty 40

## 2013-02-25 MED ORDER — DEXAMETHASONE SODIUM PHOSPHATE 10 MG/ML IJ SOLN
10.0000 mg | Freq: Once | INTRAMUSCULAR | Status: AC
  Administered 2013-02-25: 10 mg via INTRAVENOUS
  Filled 2013-02-25: qty 1

## 2013-02-25 MED ORDER — MAGNESIUM SULFATE 40 MG/ML IJ SOLN
INTRAMUSCULAR | Status: AC
  Administered 2013-02-25: 12:00:00 2 g via INTRAVENOUS
  Filled 2013-02-25: qty 50

## 2013-02-25 MED ORDER — DEXAMETHASONE SODIUM PHOSPHATE 4 MG/ML IJ SOLN
4.0000 mg | Freq: Four times a day (QID) | INTRAMUSCULAR | Status: DC
  Administered 2013-02-25: 4 mg via INTRAVENOUS
  Filled 2013-02-25: qty 1

## 2013-02-25 MED ORDER — SODIUM CHLORIDE 0.9 % IJ SOLN: 3.0000 mL | Freq: Two times a day (BID) | INTRAMUSCULAR | Status: DC

## 2013-02-25 NOTE — Progress Notes (Signed)
SBP consistently > 160, measured 165-177.  MD notified.  MD to place orders for antihypertensive meds.

## 2013-02-25 NOTE — Progress Notes (Signed)
INITIAL NUTRITION ASSESSMENT  DOCUMENTATION CODES Per approved criteria  -Obesity Unspecified   INTERVENTION: Encourage adequate PO intake Provide Multivitamin with minerals daily Provide Glucerna Shake PRN  NUTRITION DIAGNOSIS: Predicted suboptimal energy intake related to weight loss history and schedule surgery as evidenced by pt's chart.   Goal: Pt to meet >/= 90% of their estimated nutrition needs   Monitor:  PO intake Weight trends Labs  Reason for Assessment: Consult to assess  80 y.o. female  Admitting Dx: Vasogenic brain edema  ASSESSMENT: 80yo woman with PMH of DM2, HLD, HTN, GERD, CVA, Meningioma who presents to the ED with new onset of seizures. CT scan of her head revealed an increased size of right frontal meningioma, vasogenic edema and right to left shift.   Pt with uncontrolled blood sugar. Seen by diabetes coordinator. Pt states that her appetite is good and she was eating well PTA. She reports eating 3 meals daily and denies weight loss. Pt NPO this morning, diet now advanced to Carb Modified. Encouraged pt to continue eating well to improve nutrition status. Pt reports she usually weighs around 200 lbs; weight history shows 6% weight loss in the past month.   Height: Ht Readings from Last 1 Encounters:  02/25/13 5\' 4"  (1.626 m)    Weight: Wt Readings from Last 1 Encounters:  02/25/13 193 lb 2 oz (87.6 kg)    Ideal Body Weight: 120 lbs  % Ideal Body Weight: 161%  Wt Readings from Last 10 Encounters:  02/25/13 193 lb 2 oz (87.6 kg)  02/24/13 194 lb 1 oz (88.026 kg)  02/22/13 194 lb 1.6 oz (88.043 kg)  02/01/13 197 lb (89.359 kg)  01/27/13 205 lb 6.4 oz (93.169 kg)  01/27/13 205 lb 6.4 oz (93.169 kg)  12/23/12 205 lb 12.8 oz (93.35 kg)  09/06/12 204 lb 6.4 oz (92.715 kg)  07/04/12 200 lb (90.719 kg)  06/27/12 208 lb 8 oz (94.575 kg)    Usual Body Weight: 200 lbs  % Usual Body Weight: 97%  BMI:  Body mass index is 33.13 kg/(m^2).  (Obesity)  Estimated Nutritional Needs: Kcal: 1600-1850 Protein: 90-100 grams Fluid: > 1.8 L/day  Skin: intact  Diet Order: Carb Control  EDUCATION NEEDS: -No education needs identified at this time   Intake/Output Summary (Last 24 hours) at 02/25/13 1523 Last data filed at 02/25/13 1400  Gross per 24 hour  Intake    260 ml  Output    300 ml  Net    -40 ml    Last BM: PTA   Labs:   Recent Labs Lab 02/24/13 1602 02/25/13 0212 02/25/13 0213  NA 137 141  --   K 3.0* 3.1*  --   CL 94* 101  --   CO2 27 29  --   BUN 13 13  --   CREATININE 1.20* 1.05  --   CALCIUM 8.3* 8.4  --   MG  --   --  1.5  GLUCOSE 493* 399*  --     CBG (last 3)   Recent Labs  02/25/13 0810 02/25/13 1252 02/25/13 1302  GLUCAP 343* 433* 371*    Scheduled Meds: . dexamethasone  4 mg Intravenous Q6H  . heparin  5,000 Units Subcutaneous Q8H  . insulin aspart  0-20 Units Subcutaneous TID WC  . insulin aspart  5 Units Subcutaneous TID WC  . insulin glargine  15 Units Subcutaneous QHS  . levETIRAcetam  500 mg Intravenous BID  . pantoprazole (PROTONIX) IV  40 mg Intravenous Q24H  . sodium chloride  3 mL Intravenous Q12H    Continuous Infusions:   Past Medical History  Diagnosis Date  . Diabetes mellitus 2007    HgA1C (02/20/2010) = 9.2, HgA1C (03/20/2009) = 12.1  . Hyperlipidemia   . Hypertension   . GERD (gastroesophageal reflux disease)   . CVA (cerebrovascular accident) 6861     right embolic stroke, no residual deficits  . Diverticulitis   . CVA (cerebral infarction) 7-yrs ago  . Arthritis   . Dysphagia   . CHF (congestive heart failure)   . DIABETES MELLITUS, TYPE II 12/04/2005  . HYPERLIPIDEMIA 12/04/2005  . HYPERTENSION 12/04/2005  . GERD 12/04/2005  . ARTHRITIS, KNEE 03/24/2006  . Lung mass 07/08/2010  . Meningioma 07/21/2010  . Hemangioma of liver 12/02/2010  . Depression 12/02/2010  . Adenocarcinoma of lung      Right upper lobe adenocarcinoma.     Past  Surgical History  Procedure Laterality Date  . Abdominal hysterectomy    . Video bronchoscope.  12/29/2007    [place]  . Wide excision of left upper back mass.    . Extracapsular cataract extraction with intraocular      lens implantation.  . Right vats,right thoracotomy,right lower lobectomy with node dissection    . Incision and drainage perirectal abscess N/A 01/19/2013    Procedure: IRRIGATION AND DEBRIDEMENT PERIRECTAL ABSCESS;  Surgeon: Zenovia Jarred, MD;  Location: [facility];  Service: General;  Laterality: N/A;    Pryor Ochoa RD, LDN Inpatient Clinical Dietitian Pager: [phone] After Hours Pager: [phone]

## 2013-02-25 NOTE — Progress Notes (Signed)
Inpatient Diabetes Program Recommendations  AACE/ADA: New Consensus Statement on Inpatient Glycemic Control (2013)  Target Ranges:  Prepandial:   less than 140 mg/dL      Peak postprandial:   less than 180 mg/dL (1-2 hours)      Critically ill patients:  140 - 180 mg/dL   Inpatient Diabetes Program Recommendations Insulin - Basal: Consider increasing Lantus by 10 units  Correction (SSI): . Insulin - Meal Coverage: . Thank you  Raoul Pitch BSN, RN,CDE Inpatient Diabetes Coordinator [phone] (team pager)

## 2013-02-25 NOTE — ED Notes (Signed)
Per EMS, called to home for AMS and possible seizures.  Famiyl stated that pt had been confused and was shaking in bed and confused.  Per EMS, pt was been A&O x4 and did not want to be transported.  Pt had one Focal motor seizure in route that lasted approx 2 minutes.  2.5 of versed given by EMS in route.  Pt currently very lethargic, but will respond to questions is spoke loudly.

## 2013-02-25 NOTE — ED Provider Notes (Addendum)
CSN: [id number]     Arrival date & time 02/25/13  0141 History   First MD Initiated Contact with Patient 02/25/13 0204     Chief Complaint  Patient presents with  . Seizures   (Consider location/radiation/quality/duration/timing/severity/associated sxs/prior Treatment) Patient is a 80 y.o. female presenting with seizures. The history is provided by the EMS personnel and a relative. The history is limited by the condition of the patient (Altered mental status).  Seizures She has been having episodes since yesterday where she turns her head to the left, has some generalized shaking, and makes a buzzing sound. These only last a couple minutes but then she will be poorly responsive for about 5 minutes after him. She had 2 episodes yesterday and has had about 8 episodes today. There's no incontinence and no bit lip or tongue. Family brought in video of 2 of episodes. EMS reported a focal seizure and treated her with midazolam. Of note, she had been treated for lung cancer about 18 months ago and had been hospitalized about one month ago for a perirectal abscess. Blood sugars have been running high at home.  Past Medical History  Diagnosis Date  . Diabetes mellitus 2007    HgA1C (02/20/2010) = 9.2, HgA1C (03/20/2009) = 12.1  . Hyperlipidemia   . Hypertension   . GERD (gastroesophageal reflux disease)   . CVA (cerebrovascular accident) 9476     right embolic stroke, no residual deficits  . Diverticulitis   . CVA (cerebral infarction) 7-yrs ago  . Arthritis   . Dysphagia   . CHF (congestive heart failure)   . DIABETES MELLITUS, TYPE II 12/04/2005  . HYPERLIPIDEMIA 12/04/2005  . HYPERTENSION 12/04/2005  . GERD 12/04/2005  . ARTHRITIS, KNEE 03/24/2006  . Lung mass 07/08/2010  . Meningioma 07/21/2010  . Hemangioma of liver 12/02/2010  . Depression 12/02/2010  . Adenocarcinoma of lung      Right upper lobe adenocarcinoma.    Past Surgical History  Procedure Laterality Date  . Abdominal  hysterectomy    . Video bronchoscope.  12/29/2007    [place]  . Wide excision of left upper back mass.    . Extracapsular cataract extraction with intraocular      lens implantation.  . Right vats,right thoracotomy,right lower lobectomy with node dissection    . Incision and drainage perirectal abscess N/A 01/19/2013    Procedure: IRRIGATION AND DEBRIDEMENT PERIRECTAL ABSCESS;  Surgeon: Zenovia Jarred, MD;  Location: [place];  Service: General;  Laterality: N/A;   Family History  Problem Relation Age of Onset  . Hyperlipidemia Brother   . Hypertension Brother   . Diabetes Brother    History  Substance Use Topics  . Smoking status: Former Smoker    Types: Cigarettes    Quit date: 02/17/2000  . Smokeless tobacco: Never Used  . Alcohol Use: No   OB History   Grav Para Term Preterm Abortions TAB SAB Ect Mult Living                 Review of Systems  Unable to perform ROS: Mental status change  Neurological: Positive for seizures.    Allergies  Review of patient's allergies indicates no known allergies.  Home Medications   Current Outpatient Rx  Name  Route  Sig  Dispense  Refill  . albuterol (PROVENTIL HFA;VENTOLIN HFA) 108 (90 BASE) MCG/ACT inhaler   Inhalation   Inhale 2 puffs into the lungs every 4 (four) hours as needed for wheezing.   3  Inhaler   1   . albuterol (PROVENTIL) (2.5 MG/3ML) 0.083% nebulizer solution   Nebulization   Take 3 mLs (2.5 mg total) by nebulization every 6 (six) hours as needed for wheezing.   75 mL   12   . amLODipine (NORVASC) 10 MG tablet   Oral   Take 1 tablet (10 mg total) by mouth daily.   30 tablet   3   . aspirin 81 MG tablet   Oral   Take 81 mg by mouth daily.          . Blood Glucose Monitoring Suppl (ACCU-CHEK AVIVA PLUS) W/DEVICE KIT   Does not apply   1 each by Does not apply route 3 (three) times daily. dx code 250.00 insulin requiring   1 kit   1   . carvedilol (COREG) 25 MG tablet   Oral   Take 1 tablet (25  mg total) by mouth 2 (two) times daily with a meal.   180 tablet   4   . [facility] LANCETS MICRO THIN 33G MISC      Check blood sugar 3 times a day before meals dx code 250.00 insulin requiring   100 each   12   . furosemide (LASIX) 20 MG tablet   Oral   Take 1 tablet (20 mg total) by mouth daily.   90 tablet   0   . glucose blood (ACCU-CHEK AVIVA PLUS) test strip      Check blood sugar 3 times a day before meals dx code 250.00 insulin requiring   100 each   12   . HYDROcodone-acetaminophen (NORCO/VICODIN) 5-325 MG per tablet   Oral   Take 1 tablet by mouth 2 (two) times daily as needed for moderate pain. Do not fill before 04/16/2013   60 tablet   0     Do not fill before 04/16/13   . insulin aspart (NOVOLOG) 100 UNIT/ML injection   Subcutaneous   Inject 1-5 Units into the skin 2 (two) times daily before lunch and supper.   10 mL   11   . Insulin Glargine (LANTUS SOLOSTAR) 100 UNIT/ML Solostar Pen   Subcutaneous   Inject 15 Units into the skin at bedtime.   10 mL   5   . Lancet Devices (ACCU-CHEK SOFTCLIX) lancets      Check blood sugar 3 times a day before meals dx code 250.00 insulin requiring   100 each   12   . Loratadine 10 MG CAPS   Oral   Take 1 capsule (10 mg total) by mouth daily.   30 each   0   . pantoprazole (PROTONIX) 20 MG tablet   Oral   Take 20 mg by mouth daily.         . simvastatin (ZOCOR) 20 MG tablet   Oral   Take 20 mg by mouth daily.         Marland Kitchen triamcinolone ointment (KENALOG) 0.1 %   Topical   Apply 1 application topically 2 (two) times daily.   30 g   0    BP 153/57  Pulse 82  Temp(Src) 98.1 F (36.7 C) (Oral)  Resp 24  SpO2 98% Physical Exam  Nursing note and vitals reviewed.  80 year old female, resting comfortably and in no acute distress. Vital signs are significant for hypertension with blood pressure 153/57, and tachypnea with respiratory rate of 24. Oxygen saturation is 98%, which is normal. Head is normocephalic  and atraumatic.  PERRLA, EOMI. Oropharynx is clear. Fundi are poorly seen. Neck is nontender and supple without adenopathy or JVD. Back is nontender and there is no CVA tenderness. Lungs are clear without rales, wheezes, or rhonchi. Chest is nontender. Heart has regular rate and rhythm without murmur. Abdomen is soft, flat, nontender without masses or hepatosplenomegaly and peristalsis is normoactive. Extremities have no cyanosis or edema, full range of motion is present. Skin is warm and dry without rash. Neurologic: She is awake and alert but does not answer questions or follow commands, cranial nerves are intact, there are no motor or sensory deficits.  ED Course  Procedures (including critical care time) Labs Review Results for orders placed during the hospital encounter of 01/77/93  BASIC METABOLIC PANEL      Result Value Range   Sodium 141  137 - 147 mEq/L   Potassium 3.1 (*) 3.7 - 5.3 mEq/L   Chloride 101  96 - 112 mEq/L   CO2 29  19 - 32 mEq/L   Glucose, Bld 399 (*) 70 - 99 mg/dL   BUN 13  6 - 23 mg/dL   Creatinine, Ser 1.05  0.50 - 1.10 mg/dL   Calcium 8.4  8.4 - 10.5 mg/dL   GFR calc non Af Amer 50 (*) >90 mL/min   GFR calc Af Amer 58 (*) >90 mL/min  CBC      Result Value Range   WBC 7.4  4.0 - 10.5 K/uL   RBC 4.16  3.87 - 5.11 MIL/uL   Hemoglobin 10.1 (*) 12.0 - 15.0 g/dL   HCT 32.7 (*) 36.0 - 46.0 %   MCV 78.6  78.0 - 100.0 fL   MCH 24.3 (*) 26.0 - 34.0 pg   MCHC 30.9  30.0 - 36.0 g/dL   RDW 15.6 (*) 11.5 - 15.5 %   Platelets 305  150 - 400 K/uL  CK      Result Value Range   Total CK 34  7 - 177 U/L  URINALYSIS, ROUTINE W REFLEX MICROSCOPIC      Result Value Range   Color, Urine YELLOW  YELLOW   APPearance CLOUDY (*) CLEAR   Specific Gravity, Urine 1.020  1.005 - 1.030   pH 6.5  5.0 - 8.0   Glucose, UA >1000 (*) NEGATIVE mg/dL   Hgb urine dipstick TRACE (*) NEGATIVE   Bilirubin Urine NEGATIVE  NEGATIVE   Ketones, ur NEGATIVE  NEGATIVE mg/dL   Protein, ur  NEGATIVE  NEGATIVE mg/dL   Urobilinogen, UA 0.2  0.0 - 1.0 mg/dL   Nitrite NEGATIVE  NEGATIVE   Leukocytes, UA SMALL (*) NEGATIVE  URINE RAPID DRUG SCREEN (HOSP PERFORMED)      Result Value Range   Opiates NONE DETECTED  NONE DETECTED   Cocaine NONE DETECTED  NONE DETECTED   Benzodiazepines POSITIVE (*) NONE DETECTED   Amphetamines NONE DETECTED  NONE DETECTED   Tetrahydrocannabinol NONE DETECTED  NONE DETECTED   Barbiturates NONE DETECTED  NONE DETECTED  MAGNESIUM      Result Value Range   Magnesium 1.5  1.5 - 2.5 mg/dL  GLUCOSE, CAPILLARY      Result Value Range   Glucose-Capillary 356 (*) 70 - 99 mg/dL  URINE MICROSCOPIC-ADD ON      Result Value Range   Squamous Epithelial / LPF MANY (*) RARE   WBC, UA 3-6  <3 WBC/hpf   RBC / HPF 0-2  <3 RBC/hpf   Bacteria, UA RARE  RARE  CG4 I-STAT (LACTIC  ACID)      Result Value Range   Lactic Acid, Venous 0.89  0.5 - 2.2 mmol/L   Imaging Review Ct Head Wo Contrast   (if New Onset Seizure And/or Head Trauma)  02/25/2013   CLINICAL DATA:  Possible seizure, confusion, altered mental status  EXAM: CT HEAD WITHOUT CONTRAST  TECHNIQUE: Contiguous axial images were obtained from the base of the skull through the vertex without intravenous contrast.  COMPARISON:  Prior MRI from 11/13/2010 and CT from 07/08/2010.  FINDINGS: There is abnormal cortical thickening with loss of sulcation within the anterior and medial right frontal lobe in region of previously identified meningioma. This lesion appears markedly increased in size, although specific measurements are hard to obtain on is noncontrast head CT. A few scattered calcifications are seen within this region, as can be seen with meningiomas. There is increased vasogenic edema within the right frontal lobe with secondary partial effacement of the anterior horn of the right lateral ventricle and 8 mm of right-to-left midline shift now seen at the anterior falx. No hydrocephalus.  No intracranial hemorrhage.  No large vessel territory infarct. Remote lacunar infarct within the right basal ganglia again noted. Additional remote right frontal infarct also noted. Prominent atherosclerotic calcifications noted within the cavernous segments of the internal carotid arteries bilaterally.  No extra-axial fluid collection.  Calvarium is intact.  Orbits are normal.  Paranasal sinuses and mastoid air cells are clear.  IMPRESSION: 1. Interval increase in size of right frontal meningioma with increased vasogenic edema and new 8 mm of right-to-left midline shift. Further evaluation with contrast-enhanced MRI is recommended. 2. No acute intracranial hemorrhage or infarct. 3. Remote right frontal and lacunar infarct within the right basal ganglia, unchanged.  Critical Value/emergent results were called by telephone at the time of interpretation on 02/25/2013 at 3:22 AM to Dr. Delora Fuel , who verbally acknowledged these results.   Electronically Signed   By: Jeannine Boga M.D.   On: 02/25/2013 03:30   Images viewed by me, discussed with radiologist .  MDM   1. Focal seizure   2. Meningioma   3. Anemia   4. Hypokalemia   5. Hyperglycemia    New onset seizures with focal component of had consistently turning to the left. With her history of lung cancer, this is concerning for possible brain metastases. She is sent for CT of head and seizure laboratory workup is initiated. Because of multiple seizures, she is started on levetiracetam. Old records are reviewed and she had been seen earlier this evening for altered mentation and elevated blood sugar.  She seizures since being started on levetiracetam. CT head shows a large right frontal lobe tumor with midline shift. She had previous known meningioma and this may be degeneration of that. There is some vasogenic edema, so she is started on dexamethasone. Hypokalemia is noted and she was given some potassium both orally and intravenously. Case is discussed with Dr.  Hayes Ludwig of internal medicine teaching service who agrees to admit the patient.  Delora Fuel, MD [id number]  Delora Fuel, MD 59/47/07 [id number]

## 2013-02-25 NOTE — Consult Note (Signed)
Will see after MRI

## 2013-02-25 NOTE — Consult Note (Signed)
Reason for Consult:brain tumor Referring Physician: Dr. Rachell Burns is an 80 y.o. female.  HPI: pt presented to ER after siezure  - Ct head showed enlarged right frontal tumor (possible meningioma) when compared to 2012.  Pt admitted and we were called to eval.  No further confirmed seizure activity  - Pt with no complaints, but has confusion. Also history of lung Ca. And poorly controlled diabetic.  Recent rectal abscess.  PMH:  Past Medical History  Diagnosis Date  . Diabetes mellitus 2007    HgA1C (02/20/2010) = 9.2, HgA1C (03/20/2009) = 12.1  . Hyperlipidemia   . Hypertension   . GERD (gastroesophageal reflux disease)   . CVA (cerebrovascular accident) 8453     right embolic stroke, no residual deficits  . Diverticulitis   . CVA (cerebral infarction) 7-yrs ago  . Arthritis   . Dysphagia   . CHF (congestive heart failure)   . DIABETES MELLITUS, TYPE II 12/04/2005  . HYPERLIPIDEMIA 12/04/2005  . HYPERTENSION 12/04/2005  . GERD 12/04/2005  . ARTHRITIS, KNEE 03/24/2006  . Lung mass 07/08/2010  . Meningioma 07/21/2010  . Hemangioma of liver 12/02/2010  . Depression 12/02/2010  . Adenocarcinoma of lung      Right upper lobe adenocarcinoma.     Past Surgical History  Procedure Laterality Date  . Abdominal hysterectomy    . Video bronchoscope.  12/29/2007    [place]  . Wide excision of left upper back mass.    . Extracapsular cataract extraction with intraocular      lens implantation.  . Right vats,right thoracotomy,right lower lobectomy with node dissection    . Incision and drainage perirectal abscess N/A 01/19/2013    Procedure: IRRIGATION AND DEBRIDEMENT PERIRECTAL ABSCESS;  Surgeon: Zenovia Jarred, MD;  Location: [employer];  Service: General;  Laterality: N/A;    Family History:  Family History  Problem Relation Age of Onset  . Hyperlipidemia Brother   . Hypertension Brother   . Diabetes Brother     Social History:  reports that she quit smoking about 13  years ago. Her smoking use included Cigarettes. She smoked 0.00 packs per day. She has never used smokeless tobacco. She reports that she does not drink alcohol or use illicit drugs.  Allergies: No Known Allergies  Medications:  Prior to Admission medications   Medication Sig Start Date End Date Taking? Authorizing Provider  amLODipine (NORVASC) 10 MG tablet Take 1 tablet (10 mg total) by mouth daily. 01/27/13  Yes Michail Jewels, MD  aspirin 81 MG tablet Take 81 mg by mouth daily.    Yes Historical Provider, MD  carvedilol (COREG) 25 MG tablet Take 1 tablet (25 mg total) by mouth 2 (two) times daily with a meal. 01/03/13  Yes Jerene Pitch, MD  furosemide (LASIX) 20 MG tablet Take 1 tablet (20 mg total) by mouth daily. 02/03/13  Yes Dominic Pea, DO  HYDROcodone-acetaminophen (NORCO/VICODIN) 5-325 MG per tablet Take 1 tablet by mouth 2 (two) times daily as needed for moderate pain. Do not fill before 04/16/2013 04/16/13 05/16/13 Yes Jerene Pitch, MD  Insulin Glargine (LANTUS SOLOSTAR) 100 UNIT/ML Solostar Pen Inject 15 Units into the skin at bedtime. 02/22/13  Yes Clinton Gallant, MD  Loratadine 10 MG CAPS Take 1 capsule (10 mg total) by mouth daily. 01/30/13  Yes Na Li, MD  pantoprazole (PROTONIX) 20 MG tablet Take 20 mg by mouth daily.   Yes Historical Provider, MD  simvastatin (ZOCOR) 20 MG tablet Take 20 mg  by mouth daily.   Yes Historical Provider, MD  triamcinolone ointment (KENALOG) 0.1 % Apply 1 application topically 2 (two) times daily. 12/23/12  Yes Ejiroghene Emokpae, MD  albuterol (PROVENTIL HFA;VENTOLIN HFA) 108 (90 BASE) MCG/ACT inhaler Inhale 2 puffs into the lungs every 4 (four) hours as needed for wheezing. 01/03/13   Jerene Pitch, MD  albuterol (PROVENTIL) (2.5 MG/3ML) 0.083% nebulizer solution Take 3 mLs (2.5 mg total) by nebulization every 6 (six) hours as needed for wheezing. 11/03/11   Ansel Bong, MD  Blood Glucose Monitoring Suppl (ACCU-CHEK AVIVA PLUS) W/DEVICE KIT 1 each by  Does not apply route 3 (three) times daily. dx code 250.00 insulin requiring 12/08/12   Jerene Pitch, MD  [employer] LANCETS MICRO THIN 33G MISC Check blood sugar 3 times a day before meals dx code 250.00 insulin requiring 08/16/12   Jerene Pitch, MD  glucose blood (ACCU-CHEK AVIVA PLUS) test strip Check blood sugar 3 times a day before meals dx code 250.00 insulin requiring 12/08/12   Jerene Pitch, MD  insulin aspart (NOVOLOG) 100 UNIT/ML injection Inject 1-5 Units into the skin 2 (two) times daily before lunch and supper. 02/22/13   Clinton Gallant, MD  Lanc[employer] [facility]) lancets Check blood sugar 3 times a day before meals dx code 250.00 insulin requiring 12/08/12   Jerene Pitch, MD    Results for orders placed during the hospital encounter of 02/25/13 (from the past 48 hour(s))  BASIC METABOLIC PANEL     Status: Abnormal   Collection Time    02/25/13  2:12 AM      Result Value Range   Sodium 141  137 - 147 mEq/L   Potassium 3.1 (*) 3.7 - 5.3 mEq/L   Chloride 101  96 - 112 mEq/L   CO2 29  19 - 32 mEq/L   Glucose, Bld 399 (*) 70 - 99 mg/dL   BUN 13  6 - 23 mg/dL   Creatinine, Ser 1.05  0.50 - 1.10 mg/dL   Calcium 8.4  8.4 - 10.5 mg/dL   GFR calc non Af Amer 50 (*) >90 mL/min   GFR calc Af Amer 58 (*) >90 mL/min   Comment: (NOTE)     The eGFR has been calculated using the CKD EPI equation.     This calculation has not been validated in all clinical situations.     eGFR's persistently <90 mL/min signify possible Chronic Kidney     Disease.  CBC     Status: Abnormal   Collection Time    02/25/13  2:12 AM      Result Value Range   WBC 7.4  4.0 - 10.5 K/uL   RBC 4.16  3.87 - 5.11 MIL/uL   Hemoglobin 10.1 (*) 12.0 - 15.0 g/dL   HCT 32.7 (*) 36.0 - 46.0 %   MCV 78.6  78.0 - 100.0 fL   MCH 24.3 (*) 26.0 - 34.0 pg   MCHC 30.9  30.0 - 36.0 g/dL   RDW 15.6 (*) 11.5 - 15.5 %   Platelets 305  150 - 400 K/uL  CK     Status: None   Collection Time    02/25/13  2:13 AM       Result Value Range   Total CK 34  7 - 177 U/L  MAGNESIUM     Status: None   Collection Time    02/25/13  2:13 AM      Result Value Range   Magnesium 1.5  1.5 - 2.5 mg/dL  GLUCOSE, CAPILLARY     Status: Abnormal   Collection Time    02/25/13  2:19 AM      Result Value Range   Glucose-Capillary 356 (*) 70 - 99 mg/dL  CG4 I-STAT (LACTIC ACID)     Status: None   Collection Time    02/25/13  2:20 AM      Result Value Range   Lactic Acid, Venous 0.89  0.5 - 2.2 mmol/L  URINALYSIS, ROUTINE W REFLEX MICROSCOPIC     Status: Abnormal   Collection Time    02/25/13  2:37 AM      Result Value Range   Color, Urine YELLOW  YELLOW   APPearance CLOUDY (*) CLEAR   Specific Gravity, Urine 1.020  1.005 - 1.030   pH 6.5  5.0 - 8.0   Glucose, UA >1000 (*) NEGATIVE mg/dL   Hgb urine dipstick TRACE (*) NEGATIVE   Bilirubin Urine NEGATIVE  NEGATIVE   Ketones, ur NEGATIVE  NEGATIVE mg/dL   Protein, ur NEGATIVE  NEGATIVE mg/dL   Urobilinogen, UA 0.2  0.0 - 1.0 mg/dL   Nitrite NEGATIVE  NEGATIVE   Leukocytes, UA SMALL (*) NEGATIVE  URINE RAPID DRUG SCREEN (HOSP PERFORMED)     Status: Abnormal   Collection Time    02/25/13  2:37 AM      Result Value Range   Opiates NONE DETECTED  NONE DETECTED   Cocaine NONE DETECTED  NONE DETECTED   Benzodiazepines POSITIVE (*) NONE DETECTED   Amphetamines NONE DETECTED  NONE DETECTED   Tetrahydrocannabinol NONE DETECTED  NONE DETECTED   Barbiturates NONE DETECTED  NONE DETECTED   Comment:            DRUG SCREEN FOR MEDICAL PURPOSES     ONLY.  IF CONFIRMATION IS NEEDED     FOR ANY PURPOSE, NOTIFY LAB     WITHIN 5 DAYS.                LOWEST DETECTABLE LIMITS     FOR URINE DRUG SCREEN     Drug Class       Cutoff (ng/mL)     Amphetamine      1000     Barbiturate      200     Benzodiazepine   161     Tricyclics       096     Opiates          300     Cocaine          300     THC              50  URINE MICROSCOPIC-ADD ON     Status: Abnormal   Collection  Time    02/25/13  2:37 AM      Result Value Range   Squamous Epithelial / LPF MANY (*) RARE   WBC, UA 3-6  <3 WBC/hpf   RBC / HPF 0-2  <3 RBC/hpf   Bacteria, UA RARE  RARE  GLUCOSE, CAPILLARY     Status: Abnormal   Collection Time    02/25/13  8:10 AM      Result Value Range   Glucose-Capillary 343 (*) 70 - 99 mg/dL   Comment 1 Documented in Chart     Comment 2 Notify RN      Ct Head Wo Contrast   (if New Onset Seizure And/or Head Trauma)  02/25/2013   CLINICAL DATA:  Possible seizure, confusion, altered mental status  EXAM: CT HEAD WITHOUT CONTRAST  TECHNIQUE: Contiguous axial images were obtained from the base of the skull through the vertex without intravenous contrast.  COMPARISON:  Prior MRI from 11/13/2010 and CT from 07/08/2010.  FINDINGS: There is abnormal cortical thickening with loss of sulcation within the anterior and medial right frontal lobe in region of previously identified meningioma. This lesion appears markedly increased in size, although specific measurements are hard to obtain on is noncontrast head CT. A few scattered calcifications are seen within this region, as can be seen with meningiomas. There is increased vasogenic edema within the right frontal lobe with secondary partial effacement of the anterior horn of the right lateral ventricle and 8 mm of right-to-left midline shift now seen at the anterior falx. No hydrocephalus.  No intracranial hemorrhage. No large vessel territory infarct. Remote lacunar infarct within the right basal ganglia again noted. Additional remote right frontal infarct also noted. Prominent atherosclerotic calcifications noted within the cavernous segments of the internal carotid arteries bilaterally.  No extra-axial fluid collection.  Calvarium is intact.  Orbits are normal.  Paranasal sinuses and mastoid air cells are clear.  IMPRESSION: 1. Interval increase in size of right frontal meningioma with increased vasogenic edema and new 8 mm of  right-to-left midline shift. Further evaluation with contrast-enhanced MRI is recommended. 2. No acute intracranial hemorrhage or infarct. 3. Remote right frontal and lacunar infarct within the right basal ganglia, unchanged.  Critical Value/emergent results were called by telephone at the time of interpretation on 02/25/2013 at 3:22 AM to Dr. Delora Fuel , who verbally acknowledged these results.   Electronically Signed   By: Jeannine Boga M.D.   On: 02/25/2013 03:30   MRI  - large dural based right frontal mass, probably meningioma, much enlarged from 2012, mild/mod edema in underlying frontal lobe.  Looks like anterior sagittal sinus occluded.   ROS As above, limited by pt's confusion   Blood pressure 166/82, pulse 82, temperature 98.3 F (36.8 C), temperature source Oral, resp. rate 18, height '5\' 4"'  (1.626 m), weight 87.6 kg (193 lb 2 oz), SpO2 98.00%. Physical Exam AAOx3 but poor historian and gets confused during conversation Moves all 4 well and FC all 4, no drift.    CN II-XII intact ,   Gait wnl  Assessment/Plan: Pt with large right frontal dural based tumor, enlarged from 2012  - now symptomatic.  Will need craniotomy for tumor resection, but needs to be medically stabilized with blood sugars and r/o andy active infectious sources.   Will also want a MRV to assess venous drainage prior to any surgery.  Agree with anticonvulsants and decadron ( as long as BS can be controlled)  Will follow.  Otilio Connors, MD 02/25/2013, 10:22 AM

## 2013-02-25 NOTE — H&P (Signed)
  Date: 02/25/2013  Patient name: [name]  Medical record number: [id number]  Date of birth: 1933-08-01   I have seen and evaluated Rush Landmark and discussed their care with the Residency Team. Briefly, Ms. Eder is a 80yo woman with PMH of DM2, HLD, HTN, GERD, CVA, Meningioma who presents to the ED with new onset of seizures.  She was alert and oriented when I saw her. Per family at bedside, she began having symptoms on the day prior to admission with turning of her head, generalized shaking and making a buzzing sound.  She had decreased responsiveness after the episodes.  This happened twice.  On day of admission it happened about 8 times.  No loss of B/B or tongue biting.  On EMS arrival, they did note another seizure and she received midazolam.  She has associated symptoms including headache, change in vision, hearing loss, increased sleepiness.  She has a history of frontal meningioma.  CT scan of her head revealed an increased size of right frontal meningioma, vasogenic edema and right to left shift.  On labwork, she had a low K of 3.0, elevated glucose and AKI (Cr of 1.2), normal white count.  UA was likely contaminated given many squamous cells.   Assessment and Plan: I have seen and evaluated the patient as outlined above. I agree with the formulated Assessment and Plan as detailed in the residents' admission note, with the following changes:   1. Enlargement of meningioma causing vasogenic edema and seizures: Patient was given Keppra, seizure precautions, admit to Neuro ICU, will also start decadron given edema.  NSG has been consulted.  NPO at this time.  Plan for MRI for better evaluation of meningioma.  Seizure precautions.   2. Presumed UTI: I am not convinced she has a UTI, however, she did report symptoms.  I would cease therapy and start back only if a UC is positive.    3. Uncontrolled DM: SSI and lantus as per resident note.   Other issues per resident note.   Sid Falcon, MD 1/10/201511:04 AM

## 2013-02-25 NOTE — Evaluation (Signed)
Clinical/Bedside Swallow Evaluation Patient Details  Name: [name] MRN: [id number] Date of Birth: [birth year]  Today's Date: 02/25/2013 Time: 6295-2841 SLP Time Calculation (min): 12 min  Past Medical History:  Past Medical History  Diagnosis Date  . Diabetes mellitus 2007    HgA1C (02/20/2010) = 9.2, HgA1C (03/20/2009) = 12.1  . Hyperlipidemia   . Hypertension   . GERD (gastroesophageal reflux disease)   . CVA (cerebrovascular accident) 2006     right embolic stroke, no residual deficits  . Diverticulitis   . CVA (cerebral infarction) 7-yrs ago  . Arthritis   . Dysphagia   . CHF (congestive heart failure)   . DIABETES MELLITUS, TYPE II 12/04/2005  . HYPERLIPIDEMIA 12/04/2005  . HYPERTENSION 12/04/2005  . GERD 12/04/2005  . ARTHRITIS, KNEE 03/24/2006  . Lung mass 07/08/2010  . Meningioma 07/21/2010  . Hemangioma of liver 12/02/2010  . Depression 12/02/2010  . Adenocarcinoma of lung      Right upper lobe adenocarcinoma.    Past Surgical History:  Past Surgical History  Procedure Laterality Date  . Abdominal hysterectomy    . Video bronchoscope.  12/29/2007    [place]  . Wide excision of left upper back mass.    . Extracapsular cataract extraction with intraocular      lens implantation.  . Right vats,right thoracotomy,right lower lobectomy with node dissection    . Incision and drainage perirectal abscess N/A 01/19/2013    Procedure: IRRIGATION AND DEBRIDEMENT PERIRECTAL ABSCESS;  Surgeon: Liz Malady, MD;  Location: [facility];  Service: General;  Laterality: N/A;   HPI:  80yo woman with PMH of DM2, HLD, HTN, GERD, CVA, Meningioma who presents to the ED with new onset of seizures.  Pt with large right frontal dural based tumor, enlarged from 2012  - now symptomatic ; craniotomy pending.  Referred for STAT swallow eval.    Assessment / Plan / Recommendation Clinical Impression  Pt presents with normal oropharyngeal swallow with adequate mastication of solids, swift  swallow trigger, no indications of aspiration.  Recommend resumption of regular diet, thin liquids; meds whole in liquid.  No SLP intervention for swallowing warranted.             Diet Recommendation Regular;Thin liquid   Liquid Administration via: Cup;Straw Medication Administration: Whole meds with liquid Supervision: Patient able to self feed    Other  Recommendations Oral Care Recommendations: Oral care BID   Follow Up Recommendations  None                Swallow Study Prior Functional Status       General Date of Onset: 02/25/13 HPI: 80yo woman with PMH of DM2, HLD, HTN, GERD, CVA, Meningioma who presents to the ED with new onset of seizures.  Pt with large right frontal dural based tumor, enlarged from 2012  - now symptomatic Type of Study: Bedside swallow evaluation Previous Swallow Assessment: none Diet Prior to this Study: NPO Temperature Spikes Noted: No Respiratory Status: Room air Behavior/Cognition: Alert;Confused Oral Cavity - Dentition: Dentures, top;Dentures, bottom Self-Feeding Abilities: Able to feed self Patient Positioning: Upright in bed Baseline Vocal Quality: Clear Volitional Cough: Strong Volitional Swallow: Able to elicit    Oral/Motor/Sensory Function Overall Oral Motor/Sensory Function: Appears within functional limits for tasks assessed   Ice Chips Ice chips: Within functional limits Presentation: Self Fed   Thin Liquid Thin Liquid: Within functional limits Presentation: Cup    Nectar Thick Nectar Thick Liquid: Not tested   Honey Thick Honey  Thick Liquid: Not tested   Puree Puree: Within functional limits Presentation: Self Fed;Spoon   Solid   [name] L. [place], [place] CCC/SLP Pager [phone]     Solid: Within functional limits Presentation: Self Fed       [name] Laurice 02/25/2013,12:16 PM

## 2013-02-25 NOTE — H&P (Signed)
Date: 02/25/2013               Patient Name:  [name] MRN: [id number]  DOB: 08-22-1933 Age / Sex: 80 y.o., female   PCP: Jerene Pitch, MD           Medical Service: Internal Medicine Teaching Service         Attending Physician: Dr. Sid Falcon, MD    First Contact: Dr. Juluis Mire, MD Pager: [phone]  Second Contact: Dr. Jerene Pitch, MD Pager: [phone]       After Hours (After 5p/  First Contact Pager: [phone]  weekends / holidays): Second Contact Pager: [phone]    Most Recent Discharge Date:  02/24/13  Chief Complaint:  Chief Complaint  Patient presents with  . Seizures       History of present illness: Pt is a 80 y.o. female who has a past medical history of Diabetes mellitus (2007); Hyperlipidemia; Hypertension; GERD (gastroesophageal reflux disease); CVA (cerebrovascular accident) (2006 ); Diverticulitis; CVA (cerebral infarction) (7-yrs ago); Arthritis; Dysphagia; CHF (congestive heart failure); DIABETES MELLITUS, TYPE II (12/04/2005); HYPERLIPIDEMIA (12/04/2005); HYPERTENSION (12/04/2005); GERD (12/04/2005); ARTHRITIS, KNEE (03/24/2006); Lung mass (07/08/2010); Meningioma (07/21/2010); Hemangioma of liver (12/02/2010); Depression (12/02/2010); and Adenocarcinoma of lung.Marland Kitchen  Pt presents to the ED with new onset seizures.  Pt was very drowsy and no family members were present therefore history is limited.  According to ED notes, she began having episodes yesterday which consists of turning her head to the left with generalized shaking, and makes a buzzing sound. These episodes lasts a couple of minutes but thereafter, she will be poorly responsive for 5 minutes.  She had 2 episodes yesterday and approximately 8 episodes today. There was no loss of bowel or bladder function and no tongue biting.  Her family brought in videos of 2 of these episodes. EMS reported a focal seizure and treated her with midazolam.  She reports associated HA, changes in vision, hearing loss, and  increased sleepiness.  She also reports occasional N/V especially when she takes her BP meds which has been more chronic.  She denies any CP or SOB.  She denies any urinary symptoms.     Meds: Current Facility-Administered Medications  Medication Dose Route Frequency Provider Last Rate Last Dose  . 0.9 %  sodium chloride infusion  250 mL Intravenous PRN Blain Pais, MD      . acetaminophen (TYLENOL) tablet 650 mg  650 mg Oral Q6H PRN Blain Pais, MD       Or  . acetaminophen (TYLENOL) suppository 650 mg  650 mg Rectal Q6H PRN Blain Pais, MD      . dexamethasone (DECADRON) injection 4 mg  4 mg Intravenous Q6H Blain Pais, MD      . heparin injection 5,000 Units  5,000 Units Subcutaneous Q8H Blain Pais, MD      . insulin aspart (novoLOG) injection 0-9 Units  0-9 Units Subcutaneous Q4H Blain Pais, MD      . insulin glargine (LANTUS) injection 7 Units  7 Units Subcutaneous QHS Blain Pais, MD      . levETIRAcetam (KEPPRA) 500 mg in sodium chloride 0.9 % 100 mL IVPB  500 mg Intravenous BID Blain Pais, MD      . Levofloxacin (LEVAQUIN) IVPB 250 mg  250 mg Intravenous Q24H Blain Pais, MD      . ondansetron [facility]) tablet 4 mg  4 mg Oral Q6H PRN Blain Pais,  MD       Or  . ondansetron (ZOFRAN) injection 4 mg  4 mg Intravenous Q6H PRN Blain Pais, MD      . pantoprazole (PROTONIX) injection 40 mg  40 mg Intravenous Q24H Blain Pais, MD      . sodium chloride 0.9 % injection 3 mL  3 mL Intravenous Q12H Blain Pais, MD      . sodium chloride 0.9 % injection 3 mL  3 mL Intravenous Q12H Blain Pais, MD      . sodium chloride 0.9 % injection 3 mL  3 mL Intravenous PRN Blain Pais, MD        Prescriptions prior to admission  Medication Sig Dispense Refill  . amLODipine (NORVASC) 10 MG tablet Take 1 tablet (10 mg total) by mouth daily.  30 tablet  3  . aspirin 81 MG tablet Take 81  mg by mouth daily.       . carvedilol (COREG) 25 MG tablet Take 1 tablet (25 mg total) by mouth 2 (two) times daily with a meal.  180 tablet  4  . furosemide (LASIX) 20 MG tablet Take 1 tablet (20 mg total) by mouth daily.  90 tablet  0  . [START ON 04/16/2013] HYDROcodone-acetaminophen (NORCO/VICODIN) 5-325 MG per tablet Take 1 tablet by mouth 2 (two) times daily as needed for moderate pain. Do not fill before 04/16/2013  60 tablet  0  . Insulin Glargine (LANTUS SOLOSTAR) 100 UNIT/ML Solostar Pen Inject 15 Units into the skin at bedtime.  10 mL  5  . Loratadine 10 MG CAPS Take 1 capsule (10 mg total) by mouth daily.  30 each  0  . pantoprazole (PROTONIX) 20 MG tablet Take 20 mg by mouth daily.      . simvastatin (ZOCOR) 20 MG tablet Take 20 mg by mouth daily.      Marland Kitchen triamcinolone ointment (KENALOG) 0.1 % Apply 1 application topically 2 (two) times daily.  30 g  0  . albuterol (PROVENTIL HFA;VENTOLIN HFA) 108 (90 BASE) MCG/ACT inhaler Inhale 2 puffs into the lungs every 4 (four) hours as needed for wheezing.  3 Inhaler  1  . albuterol (PROVENTIL) (2.5 MG/3ML) 0.083% nebulizer solution Take 3 mLs (2.5 mg total) by nebulization every 6 (six) hours as needed for wheezing.  75 mL  12  . Blood Glucose Monitoring Suppl (ACCU-CHEK AVIVA PLUS) W/DEVICE KIT 1 each by Does not apply route 3 (three) times daily. dx code 250.00 insulin requiring  1 kit  1  . [employer] LANCETS MICRO THIN 33G MISC Check blood sugar 3 times a day before meals dx code 250.00 insulin requiring  100 each  12  . glucose blood (ACCU-CHEK AVIVA PLUS) test strip Check blood sugar 3 times a day before meals dx code 250.00 insulin requiring  100 each  12  . insulin aspart (NOVOLOG) 100 UNIT/ML injection Inject 1-5 Units into the skin 2 (two) times daily before lunch and supper.  10 mL  11  . Lancet Devices (ACCU-CHEK SOFTCLIX) lancets Check blood sugar 3 times a day before meals dx code 250.00 insulin requiring  100 each  12     Allergies: Allergies as of 02/25/2013  . (No Known Allergies)    PMH: Past Medical History  Diagnosis Date  . Diabetes mellitus 2007    HgA1C (02/20/2010) = 9.2, HgA1C (03/20/2009) = 12.1  . Hyperlipidemia   . Hypertension   . GERD (gastroesophageal reflux disease)   .  CVA (cerebrovascular accident) 6962     right embolic stroke, no residual deficits  . Diverticulitis   . CVA (cerebral infarction) 7-yrs ago  . Arthritis   . Dysphagia   . CHF (congestive heart failure)   . DIABETES MELLITUS, TYPE II 12/04/2005  . HYPERLIPIDEMIA 12/04/2005  . HYPERTENSION 12/04/2005  . GERD 12/04/2005  . ARTHRITIS, KNEE 03/24/2006  . Lung mass 07/08/2010  . Meningioma 07/21/2010  . Hemangioma of liver 12/02/2010  . Depression 12/02/2010  . Adenocarcinoma of lung      Right upper lobe adenocarcinoma.     PSH: Past Surgical History  Procedure Laterality Date  . Abdominal hysterectomy    . Video bronchoscope.  12/29/2007    [place]  . Wide excision of left upper back mass.    . Extracapsular cataract extraction with intraocular      lens implantation.  . Right vats,right thoracotomy,right lower lobectomy with node dissection    . Incision and drainage perirectal abscess N/A 01/19/2013    Procedure: IRRIGATION AND DEBRIDEMENT PERIRECTAL ABSCESS;  Surgeon: Zenovia Jarred, MD;  Location: [place];  Service: General;  Laterality: N/A;    FH: Family History  Problem Relation Age of Onset  . Hyperlipidemia Brother   . Hypertension Brother   . Diabetes Brother     SH: History  Substance Use Topics  . Smoking status: Former Smoker    Types: Cigarettes    Quit date: 02/17/2000  . Smokeless tobacco: Never Used  . Alcohol Use: No    Review of Systems: Pertinent items are noted in HPI, but limited by pt drowsiness.  Physical Exam: Blood pressure 166/82, pulse 82, temperature 98.3 F (36.8 C), temperature source Oral, resp. rate 18, height _0  (1.626 m), weight 87.6 kg (193 lb 2  oz), SpO2 98.00%.  Physical Exam  Constitutional: She is oriented to person, place, and time and well-developed, well-nourished, and in no distress. No distress.  HENT:  Head: Normocephalic and atraumatic.  Eyes: Conjunctivae and EOM are normal. Right pupil is reactive. Left pupil is reactive.    Cardiovascular: Normal rate, regular rhythm, normal heart sounds and intact distal pulses.  Exam reveals no gallop and no friction rub.   No murmur heard. Pulmonary/Chest: Effort normal and breath sounds normal. No respiratory distress. She has no wheezes. She has no rales. She exhibits no tenderness.  Abdominal: Soft. Bowel sounds are normal. She exhibits no distension. There is tenderness in the epigastric area and suprapubic area. There is no rigidity, no rebound and no guarding.  Neurological: She is oriented to person, place, and time. She has normal sensation and normal strength. She displays facial symmetry. No cranial nerve deficit.  Skin: Skin is warm and dry. Rash noted. She is not diaphoretic.  Extremities: no LE edema  Lab results:  Basic Metabolic Panel:  Recent Labs  02/24/13 1602 02/25/13 0212 02/25/13 0213  NA 137 141  --   K 3.0* 3.1*  --   CL 94* 101  --   CO2 27 29  --   GLUCOSE 493* 399*  --   BUN 13 13  --   CREATININE 1.20* 1.05  --   CALCIUM 8.3* 8.4  --   MG  --   --  1.5   Anion Gap: 16  Liver Function Tests:  Recent Labs  02/24/13 1602  AST 10  ALT 9  ALKPHOS 82  BILITOT <0.2*  PROT 7.5  ALBUMIN 3.5   No results found for  this basename: LIPASE, AMYLASE,  in the last 72 hours No results found for this basename: AMMONIA,  in the last 72 hours  CBC:    Component Value Date/Time   WBC 7.4 02/25/2013 0212   WBC 6.0 07/20/2011 1534   RBC 4.16 02/25/2013 0212   RBC 4.11 07/20/2011 1534   HGB 10.1* 02/25/2013 0212   HGB 9.9* 07/20/2011 1534   HCT 32.7* 02/25/2013 0212   HCT 31.5* 07/20/2011 1534   PLT 305 02/25/2013 0212   PLT 298 07/20/2011 1534   MCV  78.6 02/25/2013 0212   MCV 76.7* 07/20/2011 1534   MCH 24.3* 02/25/2013 0212   MCH 24.0* 07/20/2011 1534   MCHC 30.9 02/25/2013 0212   MCHC 31.3* 07/20/2011 1534   RDW 15.6* 02/25/2013 0212   RDW 15.4* 07/20/2011 1534   LYMPHSABS 1.8 01/19/2013 0925   LYMPHSABS 2.0 07/20/2011 1534   MONOABS 1.4* 01/19/2013 0925   MONOABS 0.7 07/20/2011 1534   EOSABS 0.1 01/19/2013 0925   EOSABS 0.1 07/20/2011 1534   BASOSABS 0.0 01/19/2013 0925   BASOSABS 0.0 07/20/2011 1534    Cardiac Enzymes: No results found for this basename: TROPIPOC,  in the last 72 hours Lab Results  Component Value Date   CKTOTAL 34 02/25/2013   CKMB 2.6 11/10/2010   TROPONINI <0.30 01/19/2013    BNP: No results found for this basename: PROBNP,  in the last 72 hours  D-Dimer: No results found for this basename: DDIMER,  in the last 72 hours  CBG:  Recent Labs  02/22/13 1552 02/24/13 1615 02/24/13 1901 02/24/13 2032 02/25/13 0219  GLUCAP 430* 508* 361* 310* 356*    Hemoglobin A1C: No results found for this basename: HGBA1C,  in the last 72 hours  Lipid Panel: No results found for this basename: CHOL, HDL, LDLCALC, TRIG, CHOLHDL, LDLDIRECT,  in the last 72 hours  Thyroid Function Tests: No results found for this basename: TSH, T4TOTAL, FREET4, T3FREE, THYROIDAB,  in the last 72 hours  Anemia Panel: No results found for this basename: VITAMINB12, FOLATE, FERRITIN, TIBC, IRON, RETICCTPCT,  in the last 72 hours  Coagulation: No results found for this basename: LABPROT, INR,  in the last 72 hours  Urine Drug Screen: Drugs of Abuse:     Component Value Date/Time   LABOPIA NONE DETECTED 02/25/2013 0237   LABOPIA PPS 06/27/2012 [place] DETECTED 02/25/2013 0237   COCAINSCRNUR NEG 06/27/2012 1645   LABBENZ POSITIVE* 02/25/2013 0237   LABBENZ PPS 06/27/2012 1645   LABBENZ NEG 07/02/2006 2021   AMPHETMU NONE DETECTED 02/25/2013 0237   AMPHETMU NEG 07/02/2006 2021   THCU NONE DETECTED 02/25/2013 0237   LABBARB NONE  DETECTED 02/25/2013 0237   LABBARB NEG 06/27/2012 1645    Alcohol Level: No results found for this basename: ETH,  in the last 72 hours  Urinalysis with microscopic:     Ref. Range 02/25/2013 02:37  Color, Urine Latest Range: YELLOW  YELLOW  APPearance Latest Range: CLEAR  CLOUDY (A)  Specific Gravity, Urine Latest Range: 1.005-1.030  1.020  pH Latest Range: 5.0-8.0  6.5  Glucose Latest Range: NEGATIVE mg/dL >1000 (A)  Bilirubin Urine Latest Range: NEGATIVE  NEGATIVE  Ketones, ur Latest Range: NEGATIVE mg/dL NEGATIVE  Protein Latest Range: NEGATIVE mg/dL NEGATIVE  Urobilinogen, UA Latest Range: 0.0-1.0 mg/dL 0.2  Nitrite Latest Range: NEGATIVE  NEGATIVE  Leukocytes, UA Latest Range: NEGATIVE  SMALL (A)  Hgb urine dipstick Latest Range: NEGATIVE  TRACE (A)  WBC,  UA Latest Range: <3 WBC/hpf 3-6  RBC / HPF Latest Range: <3 RBC/hpf 0-2  Squamous Epithelial / LPF Latest Range: RARE  MANY (A)  Bacteria, UA Latest Range: RARE  RARE   Imaging results:  Ct Head Wo Contrast   (if New Onset Seizure And/or Head Trauma)  02/25/2013   CLINICAL DATA:  Possible seizure, confusion, altered mental status  EXAM: CT HEAD WITHOUT CONTRAST  TECHNIQUE: Contiguous axial images were obtained from the base of the skull through the vertex without intravenous contrast.  COMPARISON:  Prior MRI from 11/13/2010 and CT from 07/08/2010.  FINDINGS: There is abnormal cortical thickening with loss of sulcation within the anterior and medial right frontal lobe in region of previously identified meningioma. This lesion appears markedly increased in size, although specific measurements are hard to obtain on is noncontrast head CT. A few scattered calcifications are seen within this region, as can be seen with meningiomas. There is increased vasogenic edema within the right frontal lobe with secondary partial effacement of the anterior horn of the right lateral ventricle and 8 mm of right-to-left midline shift now seen at the  anterior falx. No hydrocephalus.  No intracranial hemorrhage. No large vessel territory infarct. Remote lacunar infarct within the right basal ganglia again noted. Additional remote right frontal infarct also noted. Prominent atherosclerotic calcifications noted within the cavernous segments of the internal carotid arteries bilaterally.  No extra-axial fluid collection.  Calvarium is intact.  Orbits are normal.  Paranasal sinuses and mastoid air cells are clear.  IMPRESSION: 1. Interval increase in size of right frontal meningioma with increased vasogenic edema and new 8 mm of right-to-left midline shift. Further evaluation with contrast-enhanced MRI is recommended. 2. No acute intracranial hemorrhage or infarct. 3. Remote right frontal and lacunar infarct within the right basal ganglia, unchanged.  Critical Value/emergent results were called by telephone at the time of interpretation on 02/25/2013 at 3:22 AM to Dr. Delora Fuel , who verbally acknowledged these results.   Electronically Signed   By: Jeannine Boga M.D.   On: 02/25/2013 03:30    EKG: ED ECG REPORT   Date: 02/25/2013  EKG Time: 6:57 AM  Rate: 80  Rhythm: normal sinus rhythm  Axis: Left  Intervals:Prolonged QTc  ST&T Change: none   Narrative Interpretation: No acute changes compared to prior.    Antibiotics: Antibiotics Given (last 72 hours)   None      Anti-infectives   Start     Dose/Rate Route Frequency Ordered Stop   02/25/13 0730  Levofloxacin (LEVAQUIN) IVPB 250 mg     250 mg 50 mL/hr over 60 Minutes Intravenous Every 24 hours 02/25/13 0636       SIRS/Sepsis/Septic Shock criteria met: No  Consults: Treatment Team:  Otilio Connors, MD   Assessment & Plan by Problem: Principal Problem:   Vasogenic brain edema Active Problems:   DIABETES MELLITUS, TYPE II   HYPERTENSION   GERD   Meningioma   UTI (urinary tract infection)   Pt is a 80 y.o.    has a past medical history of Diabetes mellitus (2007);  Hyperlipidemia; Hypertension; GERD (gastroesophageal reflux disease); CVA (cerebrovascular accident) (2006 ); Diverticulitis; CVA (cerebral infarction) (7-yrs ago); Arthritis; Dysphagia; CHF (congestive heart failure); DIABETES MELLITUS, TYPE II (12/04/2005); HYPERLIPIDEMIA (12/04/2005); HYPERTENSION (12/04/2005); GERD (12/04/2005); ARTHRITIS, KNEE (03/24/2006); Lung mass (07/08/2010); Meningioma (07/21/2010); Hemangioma of liver (12/02/2010); Depression (12/02/2010); and Adenocarcinoma of lung.  She presents to the ED with new onset seizures.    Patient Active  Problem List   Diagnosis Date Noted  . Vasogenic brain edema 02/25/2013  . AKI (acute kidney injury) 01/30/2013  . Bladder outlet obstruction 01/30/2013  . Abscess of buttock, right 01/19/2013  . Rash and nonspecific skin eruption 12/27/2012  . UTI (urinary tract infection) 12/23/2012  . Left breast abscess 05/06/2012  . Risk for falls 04/29/2012  . Preventive measure 02/24/2011  . Right upper lobe, Adenocarcinoma of lung   . Hemangioma of liver 12/02/2010  . Depression 12/02/2010  . Chronic systolic congestive heart failure   . Meningioma 07/21/2010  . Shoulder pain, right 07/08/2010  . ARTHRITIS, KNEE 03/24/2006  . DIABETES MELLITUS, TYPE II 12/04/2005  . HYPERLIPIDEMIA 12/04/2005  . HYPERTENSION 12/04/2005  . GERD 12/04/2005  . CVA (cerebral infarction) 12/02/2002     Problem List Items Addressed This Visit   Meningioma    Other Visit Diagnoses   Focal seizure    -  Primary    Anemia        Hypokalemia        Hyperglycemia           #New onset seizures- Pt presents to the ED with new onset seizures.  06/2010 MRI showed roughly 2 x 3 x 4 cm right frontal parasagittal meningioma with mild surrounding edema.  10/2010 MRI revealed no change in size.  CT 02/25/13 reveals increased size of meningioma with vasogenic edema and 69m midline shift.  Pt has remote infarcts but nothing acute.  No other metabolic derangements or infectious  process to account for seizure activity.  Pt was loaded with keppra in ED and given 168mdecardron.   -admit neuro ICU -Neurosurgery consult (Dr. HiLuiz Ochoaontacted and will see in AM) -MRI brain  -NPO -seizure precautions -keppra 50054mV bid -continue decadron 4mg60m qid -frequent neuro checks -seizure precautions -hold antihypertensive meds  #Meningioma- See above.  #UTI- Pt has suprapubic tenderness on exam.   -levofloxacin IV  #UncontrolledDM-  Lab Results  Component Value Date   HGBA1C 12.1 12/23/2012   -lantus 7 units -SSI-s   #FEN- KVO  Electrolytes-Replete as needed.  Diet-NPO    #VTE prophylaxis- 5000 Units Heparin SQ tid    #Dispo- Disposition is deferred at this time, awaiting improvement of current medical problems.  Anticipated discharge in approximately 1-2 day(s).    Emergency Contact: Contact Information   Name Relation Home Work Mobile   Cummings,Seondra Daughter [phone] Hendrix,Regina Daughter [phone]    The patient does have a current PCP (SamJerene Pitch) and [facility].  Signed: JacqMichail Jewels PGY-1, Internal Medicine  02/25/2013, 6:57 AM

## 2013-02-25 NOTE — Progress Notes (Signed)
Inpatient Diabetes Program Recommendations  AACE/ADA: New Consensus Statement on Inpatient Glycemic Control (2013)  Target Ranges:  Prepandial:   less than 140 mg/dL      Peak postprandial:   less than 180 mg/dL (1-2 hours)      Critically ill patients:  140 - 180 mg/dL  Inpatient Diabetes Program Recommendations Insulin - Basal: Lantus 15 units ASAP Correction (SSI): Resistant scale TID + HS Insulin - Meal Coverage: Novolog 5 units TID  This coordinator spoke with Dr. Tana Coast and gave the above recommendations.  Will follow. Thank you  Raoul Pitch BSN, RN,CDE Inpatient Diabetes Coordinator [phone] (team pager)

## 2013-02-26 ENCOUNTER — Inpatient Hospital Stay (HOSPITAL_COMMUNITY): Payer: Medicare Other

## 2013-02-26 DIAGNOSIS — E785 Hyperlipidemia, unspecified: Secondary | ICD-10-CM

## 2013-02-26 DIAGNOSIS — E1065 Type 1 diabetes mellitus with hyperglycemia: Secondary | ICD-10-CM

## 2013-02-26 DIAGNOSIS — I10 Essential (primary) hypertension: Secondary | ICD-10-CM

## 2013-02-26 DIAGNOSIS — G936 Cerebral edema: Secondary | ICD-10-CM

## 2013-02-26 DIAGNOSIS — K219 Gastro-esophageal reflux disease without esophagitis: Secondary | ICD-10-CM

## 2013-02-26 DIAGNOSIS — IMO0002 Reserved for concepts with insufficient information to code with codable children: Secondary | ICD-10-CM

## 2013-02-26 DIAGNOSIS — E876 Hypokalemia: Secondary | ICD-10-CM

## 2013-02-26 LAB — GLUCOSE, CAPILLARY
GLUCOSE-CAPILLARY: 337 mg/dL — AB (ref 70–99)
GLUCOSE-CAPILLARY: 347 mg/dL — AB (ref 70–99)
Glucose-Capillary: 280 mg/dL — ABNORMAL HIGH (ref 70–99)
Glucose-Capillary: 333 mg/dL — ABNORMAL HIGH (ref 70–99)
Glucose-Capillary: 318 mg/dL — ABNORMAL HIGH (ref 70–99)
Glucose-Capillary: 307 mg/dL — ABNORMAL HIGH (ref 70–99)

## 2013-02-26 LAB — BASIC METABOLIC PANEL
BUN: 17 mg/dL (ref 6–23)
CHLORIDE: 98 meq/L (ref 96–112)
CO2: 25 mEq/L (ref 19–32)
Calcium: 8.9 mg/dL (ref 8.4–10.5)
Creatinine, Ser: 0.85 mg/dL (ref 0.50–1.10)
GFR calc non Af Amer: 64 mL/min — ABNORMAL LOW (ref >90)
GFR, EST AFRICAN AMERICAN: 75 mL/min — AB (ref >90)
Glucose, Bld: 350 mg/dL — ABNORMAL HIGH (ref 70–99)
POTASSIUM: 3.6 meq/L — AB (ref 3.7–5.3)
SODIUM: 138 meq/L (ref 137–147)

## 2013-02-26 LAB — MAGNESIUM: Magnesium: 2.1 mg/dL (ref 1.5–2.5)

## 2013-02-26 LAB — URINE CULTURE

## 2013-02-26 MED ORDER — FUROSEMIDE 20 MG PO TABS
20.0000 mg | ORAL_TABLET | Freq: Every day | ORAL | Status: DC
  Administered 2013-02-26 – 2013-03-01 (×4): 20 mg via ORAL
  Filled 2013-02-26 (×6): qty 1

## 2013-02-26 MED ORDER — LEVETIRACETAM 500 MG PO TABS
500.0000 mg | ORAL_TABLET | Freq: Two times a day (BID) | ORAL | Status: DC
  Administered 2013-02-26 – 2013-03-01 (×7): 500 mg via ORAL
  Filled 2013-02-26 (×10): qty 1

## 2013-02-26 MED ORDER — PANTOPRAZOLE SODIUM 40 MG PO TBEC
40.0000 mg | DELAYED_RELEASE_TABLET | Freq: Once | ORAL | Status: AC
  Administered 2013-02-27: 40 mg via ORAL
  Filled 2013-02-26: qty 1

## 2013-02-26 MED ORDER — POTASSIUM CHLORIDE CRYS ER 20 MEQ PO TBCR
40.0000 meq | EXTENDED_RELEASE_TABLET | Freq: Once | ORAL | Status: AC
  Administered 2013-02-26: 40 meq via ORAL
  Filled 2013-02-26 (×2): qty 2

## 2013-02-26 MED ORDER — INSULIN GLARGINE 100 UNIT/ML ~~LOC~~ SOLN
25.0000 [IU] | Freq: Every day | SUBCUTANEOUS | Status: DC
  Administered 2013-02-26 – 2013-02-27 (×2): 25 [IU] via SUBCUTANEOUS
  Filled 2013-02-26 (×2): qty 0.25

## 2013-02-26 MED ORDER — DEXAMETHASONE 4 MG PO TABS
4.0000 mg | ORAL_TABLET | Freq: Four times a day (QID) | ORAL | Status: DC
  Administered 2013-02-26 – 2013-02-28 (×8): 4 mg via ORAL
  Filled 2013-02-26 (×11): qty 1

## 2013-02-26 NOTE — Progress Notes (Addendum)
Subjective:  Pt seen and examined in PM. No acute events overnight. Pt reports feeling good with no complaints. She denies weakness, headache, vision changes, tinnitus, AMS, dysphagia, or paraesthesias. No further episodes of seizures since hospitalization. Pt still unsure if she would like surgery however talked to daughter who is to discuss with the other 4 children and make decision tomm.     Objective: Vital signs in last 24 hours: Filed Vitals:   02/26/13 0113 02/26/13 0402 02/26/13 0745 02/26/13 1145  BP: 167/65 148/64 188/78   Pulse: 82 78 87   Temp: 98.2 F (36.8 C) 98 F (36.7 C) 98.1 F (36.7 C) 98.2 F (36.8 C)  TempSrc: Oral Oral Oral Oral  Resp: 26 20 29    Height: 5\' 4"  (1.626 m)     Weight: 86.2 kg (190 lb 0.6 oz)     SpO2: 90% 97% 97%    Weight change: -1.4 kg (-3 lb 1.4 oz)  Intake/Output Summary (Last 24 hours) at 02/26/13 1414 Last data filed at 02/26/13 1100  Gross per 24 hour  Intake   1753 ml  Output   1275 ml  Net    478 ml   Constitutional: She is oriented to person, place, and time and well-developed, well-nourished, and in no distress. No distress.  Head: Normocephalic and atraumatic.  Eyes: Conjunctivae and EOM are normal.  Cardiovascular: Normal rate, regular rhythm, normal heart sounds. Exam reveals no gallop and no friction rub. No murmur heard.  Pulmonary/Chest: Effort normal and breath sounds normal. No respiratory distress. She has no wheezes. She has no rales. She exhibits no tenderness.  Abdominal: Soft. Bowel sounds are normal. She exhibits no distension. There is no abdominal tenderness. There is no rigidity, no rebound and no guarding.  Neurological: She is oriented to person, place, and time. She has normal sensation and normal strength. She displays facial symmetry. No cranial nerve deficit.   Extremities: no LE edema   Lab Results: Basic Metabolic Panel:  Recent Labs Lab 02/25/13 0212 02/25/13 0213 02/26/13 0244  NA 141  --   138  K 3.1*  --  3.6*  CL 101  --  98  CO2 29  --  25  GLUCOSE 399*  --  350*  BUN 13  --  17  CREATININE 1.05  --  0.85  CALCIUM 8.4  --  8.9  MG  --  1.5 2.1   Liver Function Tests:  Recent Labs Lab 02/24/13 1602  AST 10  ALT 9  ALKPHOS 82  BILITOT <0.2*  PROT 7.5  ALBUMIN 3.5   No results found for this basename: LIPASE, AMYLASE,  in the last 168 hours No results found for this basename: AMMONIA,  in the last 168 hours CBC:  Recent Labs Lab 02/24/13 1602 02/25/13 0212  WBC 6.0 7.4  HGB 11.6* 10.1*  HCT 36.4 32.7*  MCV 77.8* 78.6  PLT 238 305   Cardiac Enzymes:  Recent Labs Lab 02/25/13 0213 02/25/13 1015 02/25/13 1445 02/25/13 1958  CKTOTAL 34  --   --   --   TROPONINI  --  <0.30 <0.30 <0.30   BNP: No results found for this basename: PROBNP,  in the last 168 hours D-Dimer: No results found for this basename: DDIMER,  in the last 168 hours CBG:  Recent Labs Lab 02/25/13 1647 02/25/13 2013 02/26/13 0036 02/26/13 0404 02/26/13 0747 02/26/13 1153  GLUCAP 285* 349* 337* 347* 280* 333*   Hemoglobin A1C: No results found  for this basename: HGBA1C,  in the last 168 hours Fasting Lipid Panel: No results found for this basename: CHOL, HDL, LDLCALC, TRIG, CHOLHDL, LDLDIRECT,  in the last 168 hours Thyroid Function Tests: No results found for this basename: TSH, T4TOTAL, FREET4, T3FREE, THYROIDAB,  in the last 168 hours Coagulation: No results found for this basename: LABPROT, INR,  in the last 168 hours Anemia Panel: No results found for this basename: VITAMINB12, FOLATE, FERRITIN, TIBC, IRON, RETICCTPCT,  in the last 168 hours Urine Drug Screen: Drugs of Abuse     Component Value Date/Time   LABOPIA NONE DETECTED 02/25/2013 0237   LABOPIA PPS 06/27/2012 [facility] 02/25/2013 0237   COCAINSCRNUR NEG 06/27/2012 1645   LABBENZ POSITIVE* 02/25/2013 0237   LABBENZ PPS 06/27/2012 1645   LABBENZ NEG 07/02/2006 2021   AMPHETMU NONE  DETECTED 02/25/2013 0237   AMPHETMU NEG 07/02/2006 2021   THCU NONE DETECTED 02/25/2013 0237   LABBARB NONE DETECTED 02/25/2013 0237   LABBARB NEG 06/27/2012 1645    Alcohol Level: No results found for this basename: ETH,  in the last 168 hours Urinalysis:  Recent Labs Lab 02/24/13 1835 02/25/13 0237  COLORURINE YELLOW YELLOW  LABSPEC 1.022 1.020  PHURINE 6.0 6.5  GLUCOSEU >1000* >1000*  HGBUR NEGATIVE TRACE*  BILIRUBINUR NEGATIVE NEGATIVE  KETONESUR NEGATIVE NEGATIVE  PROTEINUR NEGATIVE NEGATIVE  UROBILINOGEN 0.2 0.2  NITRITE NEGATIVE NEGATIVE  LEUKOCYTESUR MODERATE* SMALL*    Micro Results: Recent Results (from the past 240 hour(s))  MRSA PCR SCREENING     Status: None   Collection Time    02/25/13  6:38 AM      Result Value Range Status   MRSA by PCR NEGATIVE  NEGATIVE Final   Comment:            The GeneXpert MRSA Assay (FDA     approved for NASAL specimens     only), is one component of a     comprehensive MRSA colonization     surveillance program. It is not     intended to diagnose MRSA     infection nor to guide or     monitor treatment for     MRSA infections.   Studies/Results: Ct Head Wo Contrast   (if New Onset Seizure And/or Head Trauma)  02/25/2013   CLINICAL DATA:  Possible seizure, confusion, altered mental status  EXAM: CT HEAD WITHOUT CONTRAST  TECHNIQUE: Contiguous axial images were obtained from the base of the skull through the vertex without intravenous contrast.  COMPARISON:  Prior MRI from 11/13/2010 and CT from 07/08/2010.  FINDINGS: There is abnormal cortical thickening with loss of sulcation within the anterior and medial right frontal lobe in region of previously identified meningioma. This lesion appears markedly increased in size, although specific measurements are hard to obtain on is noncontrast head CT. A few scattered calcifications are seen within this region, as can be seen with meningiomas. There is increased vasogenic edema within the  right frontal lobe with secondary partial effacement of the anterior horn of the right lateral ventricle and 8 mm of right-to-left midline shift now seen at the anterior falx. No hydrocephalus.  No intracranial hemorrhage. No large vessel territory infarct. Remote lacunar infarct within the right basal ganglia again noted. Additional remote right frontal infarct also noted. Prominent atherosclerotic calcifications noted within the cavernous segments of the internal carotid arteries bilaterally.  No extra-axial fluid collection.  Calvarium is intact.  Orbits are normal.  Paranasal  sinuses and mastoid air cells are clear.  IMPRESSION: 1. Interval increase in size of right frontal meningioma with increased vasogenic edema and new 8 mm of right-to-left midline shift. Further evaluation with contrast-enhanced MRI is recommended. 2. No acute intracranial hemorrhage or infarct. 3. Remote right frontal and lacunar infarct within the right basal ganglia, unchanged.  Critical Value/emergent results were called by telephone at the time of interpretation on 02/25/2013 at 3:22 AM to Dr. Delora Fuel , who verbally acknowledged these results.   Electronically Signed   By: Jeannine Boga M.D.   On: 02/25/2013 03:30   Mr Jeri Cos SH Contrast  02/25/2013   CLINICAL DATA:  New onset seizure. History of right frontal meningioma.  EXAM: MRI HEAD WITHOUT AND WITH CONTRAST  TECHNIQUE: Multiplanar, multiecho pulse sequences of the brain and surrounding structures were obtained without and with intravenous contrast.  CONTRAST:  43mL MULTIHANCE GADOBENATE DIMEGLUMINE 529 MG/ML IV SOLN  COMPARISON:  Head CT same day.  MRI 11/13/2010.  FINDINGS: Since the study of 2012, there has been considerable growth of the extra-axial enhancing mass on the anterior aspect of the anterior cranial fossa. Measurements are now 8 cm cephalocaudal, 5.9 cm right to left and 3.9 cm front to back maximally. This exerts considerable mass effect upon the  anterior aspect of the right hemisphere and there is associated vasogenic edema on the right. There is mass effect upon the ventricular system with distortion. There is right to left shift anteriorly of up to 12 mm. There is a pattern of enhancement throughout the adjacent right frontal brain which looks like enhancement along the penetrating vasculature. This could represent direct invasion by tumor. I can not demonstrate flow within the anterior aspect of the superior sagittal sinus and presumably there is invasion of that structure in that region. Posterior to the region of tumor, the sagittal sinus shows flow.  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 No acute brain infarction is seen.  There is a 2nd small focus of dural enhancement measuring 6 mm along the tentorium on the right, consistent with a small focus of meningioma. This is unchanged from the previous exam.  No evidence of inflammatory sinus disease. Major arterial structures at the base of the brain show flow. No pituitary lesion.  IMPRESSION: Marked enlargement of the extra-axial mass in the anterior aspect of the anterior cranial fossae on the right consistent with meningioma. Measurements today are 8 x 5.9 x 3.9 cm. Considerable mass effect upon the brain with vasogenic edema in the right frontal region. Right-to-left shift of 12 mm. Penetrating type enhancement within the right frontal lobe raises concern for direct invasion of brain.  Seconds small meningioma of the tentorium on the right measuring 6 mm, unchanged since 2012.   Electronically Signed   By: Nelson Chimes M.D.   On: 02/25/2013 10:39   Dg Chest Port 1 View  02/25/2013   CLINICAL DATA:  Aspiration  EXAM: PORTABLE CHEST - 1 VIEW  COMPARISON:  05/06/2012  FINDINGS: Cardiac shadow is stable. The lungs are well aerated bilaterally. No focal infiltrate or sizable effusion is seen. Postsurgical changes are again noted on the right with some volume loss.  IMPRESSION: Chronic  changes without acute abnormality.   Electronically Signed   By: Inez Catalina M.D.   On: 02/25/2013 14:21   Mr Mrv Head Wo Cm  02/26/2013   CLINICAL DATA:  Enlarging meningioma on the right.  EXAM: MR MRV HEAD WITHOUT CONTRAST  TECHNIQUE: Multiplanar, multisequence MR imaging was performed. No intravenous contrast was administered.  COMPARISON:  02/25/2013  FINDINGS: The MR venogram confirms the absence of flow within the anterior 1/3 of the superior sagittal sinus consistent with compression and or invasion by the large anterior cranial fossa extra-axial tumor on the right. Posterior to that region, the sinus shows normal flow. Both transverse and sigmoid sinuses are patent. Both jugular veins are patent. Deep veins and straight sinus are patent.  IMPRESSION: No flow in the anterior 1/3 of the superior sagittal sinus consistent with direct invasion and or compression by the large right anterior cranial fossa extra-axial mass.   Electronically Signed   By: Nelson Chimes M.D.   On: 02/26/2013 12:02   Medications: I have reviewed the patient's current medications. Scheduled Meds: . amLODipine  10 mg Oral Daily  . carvedilol  25 mg Oral BID WC  . dexamethasone  4 mg Oral Q6H  . furosemide  20 mg Oral Daily  . heparin  5,000 Units Subcutaneous Q8H  . insulin aspart  0-20 Units Subcutaneous TID WC  . insulin aspart  5 Units Subcutaneous TID WC  . insulin glargine  25 Units Subcutaneous QHS  . levETIRAcetam  500 mg Oral BID  . multivitamin with minerals  1 tablet Oral Daily  . [START ON 02/27/2013] pantoprazole  40 mg Oral Once  . sodium chloride  3 mL Intravenous Q12H   Continuous Infusions:  PRN Meds:.acetaminophen, acetaminophen, feeding supplement (GLUCERNA SHAKE) Assessment/Plan:  Assessment: 80 year old woman who presented on 1/10 with new-onset seizures and found to have exapaneded right frontal meningioma with considerable mass effect and direct invasion of the brain.    New-onset seizures  due to meningioma complicated by mass effect (right to left shift 12 mm) with vasogenic edema and direct invasion of the right frontal lobe - no seizure activity since 1/10 -Seizure precautions -Frequent neurochecks -Transition IV Keppra 500 mg BID to PO 500 mg BID  -Transition IV decadron to PO  4 mg Q6 hr  -MRV Head 1/11 ---> No flow in the anterior 1/3 of the superior sagittal sinus consistent with direct invasion and or compression by the large right anterior cranial fossa extra-axial mass -Appreciate neurosurgery recommendations (Dr. Luiz Ochoa) -->medically stabilized (better glucose control) before craniotomy (possibly inpt vs outpt) -Plan to obtain family decision regarding surgery tomm  Uncontrolled Type I Diabetes Mellitus - Last HbA1c of 12.1 on 12/23/12. Pt at home on 15 U Lantus and 1-5 U BID before lunch and dinner.  -Appreciate diabetes coordinator recommendations --> increase 15 to 25 U Lantus daily -Resistant sliding scale with meals -5U meal time coverage TID - Carb-modified diet   Hypertension -  Currently hypertensive. Per neurosurgery to treat hypertension. -Continue home furosemide 20 mg daily  -Continue home amlodipine 10 mg daily -Continue home carvedilol 25 mg BID    Hypokalemia - Pt with potasium on admission of 3.0, today 3.6. Magnesium wnl. Most likely due to poor PO intake.  -Replete with PO potassium chloride -Continue to monitor BMP  Microcytic/Normocytic Anemia - currently stable without active bleeding. Pt with last Hg 10.1 with baseline 10-12. Etiology unknown.  -Continue to monitor for bleeding -Continue to monitor CBC -Consider anemia panel  Hyperlipidemia - Last lipid panel 10/2010 with hypercholesteremia (LDL 147 total 213)  -Continue home simvastatin 20 mg daily   GERD - currently without reflux symptoms. Pt on 20 mg protonix at home. -Continue protonix 40 mg daily  Diet: Carb modified DVT Ppx:  SQ Heparin TID Code: Full   Dispo: Disposition is  deferred at this time, awaiting improvement of current medical problems.  Anticipated discharge in approximately 3-5 day(s).   The patient does have a current PCP Jerene Pitch, MD) and does need an [facility] hospital follow-up appointment after discharge.  The patient does have transportation limitations that hinder transportation to clinic appointments.  .Services Needed at time of discharge: Y = Yes, Blank = No PT:   OT:   RN:   Equipment:   Other:     LOS: 1 day   Juluis Mire, MD 02/26/2013, 2:14 PM

## 2013-02-26 NOTE — ED Provider Notes (Signed)
80 y.o. Female with multiple medical problems who live with her daughter.  Her daughter states that she seemed to be staring into space frequently today.  Evaluation of patient reveals no focal deficit, no labs to explain events.  Patient states she remembers episodes and just did not feel like talking to family at that time.  There was no loss of consciousness or tone.  Patient care, follow up, and return precautions discussed with patient and daughter and they voice agreement and understanding of plan.   Shaune Pollack, MD 02/26/13 [phone]

## 2013-02-26 NOTE — Progress Notes (Signed)
  Date: 02/26/2013  Patient name: [name]  Medical record number: [id number]  Date of birth: 22-Oct-1933   This patient has been seen and the plan of care was discussed with the house staff. Please see their note for complete details. I concur with their findings.  Sid Falcon, MD 02/26/2013, 8:44 PM

## 2013-02-26 NOTE — Progress Notes (Signed)
Subjective: Patient arousable, no c/o  Objective: Vital signs in last 24 hours: Temp:  [97.5 F (36.4 C)-98.3 F (36.8 C)] 98 F (36.7 C) (01/11 0402) Pulse Rate:  [72-111] 78 (01/11 0402) Resp:  [14-26] 20 (01/11 0402) BP: (136-179)/(60-115) 148/64 mmHg (01/11 0402) SpO2:  [90 %-100 %] 97 % (01/11 0402) Weight:  [86.2 kg (190 lb 0.6 oz)-87.6 kg (193 lb 2 oz)] 86.2 kg (190 lb 0.6 oz) (01/11 0113)  Intake/Output from previous day: 01/10 0701 - 01/11 0700 In: 1323 [P.O.:880; I.V.:243; IV Piggyback:200] Out: 825 [Urine:825] Intake/Output this shift: Total I/O In: 103 [I.V.:103] Out: 225 [Urine:225]  Ox1 at least, not cooperative with exam this am, moving all 4 well  Lab Results:  Recent Labs  02/24/13 1602 02/25/13 0212  WBC 6.0 7.4  HGB 11.6* 10.1*  HCT 36.4 32.7*  PLT 238 305   BMET  Recent Labs  02/25/13 0212 02/26/13 0244  NA 141 138  K 3.1* 3.6*  CL 101 98  CO2 29 25  GLUCOSE 399* 350*  BUN 13 17  CREATININE 1.05 0.85  CALCIUM 8.4 8.9    Studies/Results: Ct Head Wo Contrast   (if New Onset Seizure And/or Head Trauma)  02/25/2013   CLINICAL DATA:  Possible seizure, confusion, altered mental status  EXAM: CT HEAD WITHOUT CONTRAST  TECHNIQUE: Contiguous axial images were obtained from the base of the skull through the vertex without intravenous contrast.  COMPARISON:  Prior MRI from 11/13/2010 and CT from 07/08/2010.  FINDINGS: There is abnormal cortical thickening with loss of sulcation within the anterior and medial right frontal lobe in region of previously identified meningioma. This lesion appears markedly increased in size, although specific measurements are hard to obtain on is noncontrast head CT. A few scattered calcifications are seen within this region, as can be seen with meningiomas. There is increased vasogenic edema within the right frontal lobe with secondary partial effacement of the anterior horn of the right lateral ventricle and 8 mm of  right-to-left midline shift now seen at the anterior falx. No hydrocephalus.  No intracranial hemorrhage. No large vessel territory infarct. Remote lacunar infarct within the right basal ganglia again noted. Additional remote right frontal infarct also noted. Prominent atherosclerotic calcifications noted within the cavernous segments of the internal carotid arteries bilaterally.  No extra-axial fluid collection.  Calvarium is intact.  Orbits are normal.  Paranasal sinuses and mastoid air cells are clear.  IMPRESSION: 1. Interval increase in size of right frontal meningioma with increased vasogenic edema and new 8 mm of right-to-left midline shift. Further evaluation with contrast-enhanced MRI is recommended. 2. No acute intracranial hemorrhage or infarct. 3. Remote right frontal and lacunar infarct within the right basal ganglia, unchanged.  Critical Value/emergent results were called by telephone at the time of interpretation on 02/25/2013 at 3:22 AM to Dr. Delora Fuel , who verbally acknowledged these results.   Electronically Signed   By: Jeannine Boga M.D.   On: 02/25/2013 03:30   Mr Jeri Cos ZY Contrast  02/25/2013   CLINICAL DATA:  New onset seizure. History of right frontal meningioma.  EXAM: MRI HEAD WITHOUT AND WITH CONTRAST  TECHNIQUE: Multiplanar, multiecho pulse sequences of the brain and surrounding structures were obtained without and with intravenous contrast.  CONTRAST:  66mL MULTIHANCE GADOBENATE DIMEGLUMINE 529 MG/ML IV SOLN  COMPARISON:  Head CT same day.  MRI 11/13/2010.  FINDINGS: Since the study of 2012, there has been considerable growth of the extra-axial enhancing mass on the  anterior aspect of the anterior cranial fossa. Measurements are now 8 cm cephalocaudal, 5.9 cm right to left and 3.9 cm front to back maximally. This exerts considerable mass effect upon the anterior aspect of the right hemisphere and there is associated vasogenic edema on the right. There is mass effect upon  the ventricular system with distortion. There is right to left shift anteriorly of up to 12 mm. There is a pattern of enhancement throughout the adjacent right frontal brain which looks like enhancement along the penetrating vasculature. This could represent direct invasion by tumor. I can not demonstrate flow within the anterior aspect of the superior sagittal sinus and presumably there is invasion of that structure in that region. Posterior to the region of tumor, the sagittal sinus shows flow.  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 No acute brain infarction is seen.  There is a 2nd small focus of dural enhancement measuring 6 mm along the tentorium on the right, consistent with a small focus of meningioma. This is unchanged from the previous exam.  No evidence of inflammatory sinus disease. Major arterial structures at the base of the brain show flow. No pituitary lesion.  IMPRESSION: Marked enlargement of the extra-axial mass in the anterior aspect of the anterior cranial fossae on the right consistent with meningioma. Measurements today are 8 x 5.9 x 3.9 cm. Considerable mass effect upon the brain with vasogenic edema in the right frontal region. Right-to-left shift of 12 mm. Penetrating type enhancement within the right frontal lobe raises concern for direct invasion of brain.  Seconds small meningioma of the tentorium on the right measuring 6 mm, unchanged since 2012.   Electronically Signed   By: Nelson Chimes M.D.   On: 02/25/2013 10:39   Dg Chest Port 1 View  02/25/2013   CLINICAL DATA:  Aspiration  EXAM: PORTABLE CHEST - 1 VIEW  COMPARISON:  05/06/2012  FINDINGS: Cardiac shadow is stable. The lungs are well aerated bilaterally. No focal infiltrate or sizable effusion is seen. Postsurgical changes are again noted on the right with some volume loss.  IMPRESSION: Chronic changes without acute abnormality.   Electronically Signed   By: Inez Catalina M.D.   On: 02/25/2013 14:21     Assessment/Plan: Will follow , BG still poorly controlled  LOS: 1 day     Masiyah Engen R, MD 02/26/2013, 5:38 AM

## 2013-02-27 LAB — GLUCOSE, CAPILLARY
GLUCOSE-CAPILLARY: 292 mg/dL — AB (ref 70–99)
GLUCOSE-CAPILLARY: 331 mg/dL — AB (ref 70–99)
Glucose-Capillary: 277 mg/dL — ABNORMAL HIGH (ref 70–99)
Glucose-Capillary: 293 mg/dL — ABNORMAL HIGH (ref 70–99)
Glucose-Capillary: 330 mg/dL — ABNORMAL HIGH (ref 70–99)
Glucose-Capillary: 361 mg/dL — ABNORMAL HIGH (ref 70–99)

## 2013-02-27 LAB — CBC
HCT: 30.4 % — ABNORMAL LOW (ref 36.0–46.0)
Hemoglobin: 10 g/dL — ABNORMAL LOW (ref 12.0–15.0)
MCH: 25.3 pg — AB (ref 26.0–34.0)
MCHC: 32.9 g/dL (ref 30.0–36.0)
MCV: 77 fL — AB (ref 78.0–100.0)
PLATELETS: 322 10*3/uL (ref 150–400)
RBC: 3.95 MIL/uL (ref 3.87–5.11)
RDW: 15.8 % — ABNORMAL HIGH (ref 11.5–15.5)
WBC: 9.3 10*3/uL (ref 4.0–10.5)

## 2013-02-27 LAB — BASIC METABOLIC PANEL
BUN: 22 mg/dL (ref 6–23)
CALCIUM: 8.5 mg/dL (ref 8.4–10.5)
CO2: 24 mEq/L (ref 19–32)
Chloride: 97 mEq/L (ref 96–112)
Creatinine, Ser: 0.87 mg/dL (ref 0.50–1.10)
GFR calc Af Amer: 73 mL/min — ABNORMAL LOW (ref >90)
GFR calc non Af Amer: 63 mL/min — ABNORMAL LOW (ref >90)
GLUCOSE: 329 mg/dL — AB (ref 70–99)
Potassium: 4.1 mEq/L (ref 3.7–5.3)
SODIUM: 135 meq/L — AB (ref 137–147)

## 2013-02-27 MED ORDER — INSULIN GLARGINE 100 UNIT/ML ~~LOC~~ SOLN
35.0000 [IU] | Freq: Every day | SUBCUTANEOUS | Status: DC
Start: 2013-02-28 — End: 2013-02-28
  Filled 2013-02-27: qty 0.35

## 2013-02-27 MED ORDER — LISINOPRIL 5 MG PO TABS: 5.0000 mg | ORAL_TABLET | Freq: Every day | ORAL | Status: DC

## 2013-02-27 MED ORDER — INSULIN ASPART 100 UNIT/ML ~~LOC~~ SOLN
10.0000 [IU] | Freq: Three times a day (TID) | SUBCUTANEOUS | Status: DC
  Administered 2013-02-27 – 2013-02-28 (×2): 10 [IU] via SUBCUTANEOUS

## 2013-02-27 MED ORDER — HYDRALAZINE HCL 20 MG/ML IJ SOLN: 5.0000 mg | INTRAMUSCULAR | Status: DC | PRN

## 2013-02-27 MED ORDER — INSULIN GLARGINE 100 UNIT/ML ~~LOC~~ SOLN
10.0000 [IU] | Freq: Once | SUBCUTANEOUS | Status: AC
Start: 2013-02-27 — End: 2013-02-27
  Administered 2013-02-27: 10 [IU] via SUBCUTANEOUS
  Filled 2013-02-27 (×2): qty 0.1

## 2013-02-27 MED ORDER — INSULIN ASPART 100 UNIT/ML ~~LOC~~ SOLN
8.0000 [IU] | Freq: Three times a day (TID) | SUBCUTANEOUS | Status: DC
Start: 2013-02-27 — End: 2013-02-27
  Administered 2013-02-27: 8 [IU] via SUBCUTANEOUS

## 2013-02-27 MED ORDER — LISINOPRIL 2.5 MG PO TABS
2.5000 mg | ORAL_TABLET | Freq: Every day | ORAL | Status: DC
  Filled 2013-02-27: qty 1

## 2013-02-27 NOTE — Progress Notes (Signed)
Subjective: Patient with no c/o  Objective: Vital signs in last 24 hours: Temp:  [98 F (36.7 C)-98.5 F (36.9 C)] 98.1 F (36.7 C) (01/12 1219) Pulse Rate:  [67-79] 77 (01/12 1219) Resp:  [13-19] 13 (01/12 1219) BP: (127-181)/(52-101) 131/58 mmHg (01/12 1219) SpO2:  [99 %-100 %] 99 % (01/12 1219) Weight:  [86 kg (189 lb 9.5 oz)] 86 kg (189 lb 9.5 oz) (01/12 0419)  Intake/Output from previous day: 01/11 0701 - 01/12 0700 In: 690 [P.O.:480; IV Piggyback:210] Out: 1400 [Urine:1400] Intake/Output this shift:    AAOx2+ ,  poor historian, flat affect  Lab Results:  Recent Labs  02/25/13 0212 02/27/13 0310  WBC 7.4 9.3  HGB 10.1* 10.0*  HCT 32.7* 30.4*  PLT 305 322   BMET  Recent Labs  02/26/13 0244 02/27/13 0310  NA 138 135*  K 3.6* 4.1  CL 98 97  CO2 25 24  GLUCOSE 350* 329*  BUN 17 22  CREATININE 0.85 0.87  CALCIUM 8.9 8.5    Studies/Results: Mr Mrv Head Wo Cm  02/26/2013   CLINICAL DATA:  Enlarging meningioma on the right.  EXAM: MR MRV HEAD WITHOUT CONTRAST  TECHNIQUE: Multiplanar, multisequence MR imaging was performed. No intravenous contrast was administered.  COMPARISON:  02/25/2013  FINDINGS: The MR venogram confirms the absence of flow within the anterior 1/3 of the superior sagittal sinus consistent with compression and or invasion by the large anterior cranial fossa extra-axial tumor on the right. Posterior to that region, the sinus shows normal flow. Both transverse and sigmoid sinuses are patent. Both jugular veins are patent. Deep veins and straight sinus are patent.  IMPRESSION: No flow in the anterior 1/3 of the superior sagittal sinus consistent with direct invasion and or compression by the large right anterior cranial fossa extra-axial mass.   Electronically Signed   By: Nelson Chimes M.D.   On: 02/26/2013 12:02    Assessment/Plan: Neuro stable  -   BS only slightly better  - discussed case with my partner, Dr. Christella Noa, he will assume   Neurosurgical care of pt.  We still recommend surgery for tumor resection when medically stable  LOS: 2 days     Otilio Connors, MD 02/27/2013, 1:33 PM

## 2013-02-27 NOTE — Care Management Note (Signed)
    Page 1 of 1   02/27/2013     1:47:11 PM   CARE MANAGEMENT NOTE 02/27/2013  Patient:  [name]   Account Number:  [id number]  Date Initiated:  02/27/2013  Documentation initiated by:  Debanhi Blaker  Subjective/Objective Assessment:   adm with dx of vasogenic brain edema; lives with dtr, has 3-N-1, rolling walker; active with [name] for PT    PCP  Dr Jerene Pitch     DC Planning Services  CM consult      Per UR Regulation:  Reviewed for med. necessity/level of care/duration of stay

## 2013-02-27 NOTE — Progress Notes (Signed)
  Date: 02/27/2013  Patient name: [name]  Medical record number: [id number]  Date of birth: 10-Oct-1933   This patient has been seen and the plan of care was discussed with the house staff. Please see their note for complete details. I concur with their findings with the following additions/corrections:  Given patient is now on decadron and with uncontrolled DM prior to admission, will plan for increased insulin today as per Dr. Harley Hallmark note and monitor SSI for any further needs.  Will also increase anti-hypertensive medications today, specifically adding Lisinopril and giving prn hydralazine. Sid Falcon, MD 02/27/2013, 1:46 PM

## 2013-02-27 NOTE — Progress Notes (Addendum)
Subjective:  Pt seen and examined. No acute events overnight. No seizure activity. Pt she states she feels well. She denies headache, vision change, weakness, paraesthesias, or syncope. She denies fever, chills, dyspnea, chest pain, abdominal pain, nausea, vomiting, change in BM or urination.    Objective: Vital signs in last 24 hours: Filed Vitals:   02/26/13 1956 02/27/13 0003 02/27/13 0419 02/27/13 0739  BP: 153/65 148/54 181/70 177/69  Pulse: 74 67 73 71  Temp: 98.2 F (36.8 C) 98 F (36.7 C) 98.5 F (36.9 C) 98 F (36.7 C)  TempSrc: Oral Axillary Axillary Oral  Resp: 19 17 15 13   Height:      Weight:   86 kg (189 lb 9.5 oz)   SpO2: 99% 99% 100% 99%   Weight change: -0.2 kg (-7.1 oz)  Intake/Output Summary (Last 24 hours) at 02/27/13 0801 Last data filed at 02/27/13 0700  Gross per 24 hour  Intake    585 ml  Output   1400 ml  Net   -815 ml   Constitutional: She is oriented to person, place, and time and well-developed, well-nourished, and in no distress. No distress.  Head: Normocephalic and atraumatic.  Eyes: Conjunctivae and EOM are normal.  Cardiovascular: Normal rate, regular rhythm, normal heart sounds. Exam reveals no gallop and no friction rub. No murmur heard.  Pulmonary/Chest: Effort normal and breath sounds normal. No respiratory distress. She has no wheezes. She has no rales. She exhibits no tenderness.  Abdominal: Soft. Bowel sounds are normal. She exhibits no distension. There is no abdominal tenderness. There is no rigidity, no rebound and no guarding.  Neurological: She is oriented to person, place, and time. She has normal sensation and normal strength. She displays facial symmetry. No cranial nerve deficit.   Extremities: no LE edema   Lab Results: Basic Metabolic Panel:  Recent Labs Lab 02/25/13 0213 02/26/13 0244 02/27/13 0310  NA  --  138 135*  K  --  3.6* 4.1  CL  --  98 97  CO2  --  25 24  GLUCOSE  --  350* 329*  BUN  --  17 22    CREATININE  --  0.85 0.87  CALCIUM  --  8.9 8.5  MG 1.5 2.1  --    Liver Function Tests:  Recent Labs Lab 02/24/13 1602  AST 10  ALT 9  ALKPHOS 82  BILITOT <0.2*  PROT 7.5  ALBUMIN 3.5   No results found for this basename: LIPASE, AMYLASE,  in the last 168 hours No results found for this basename: AMMONIA,  in the last 168 hours CBC:  Recent Labs Lab 02/25/13 0212 02/27/13 0310  WBC 7.4 9.3  HGB 10.1* 10.0*  HCT 32.7* 30.4*  MCV 78.6 77.0*  PLT 305 322   Cardiac Enzymes:  Recent Labs Lab 02/25/13 0213 02/25/13 1015 02/25/13 1445 02/25/13 1958  CKTOTAL 87  --   --   --   TROPONINI  --  <0.30 <0.30 <0.30   BNP: No results found for this basename: PROBNP,  in the last 168 hours D-Dimer: No results found for this basename: DDIMER,  in the last 168 hours CBG:  Recent Labs Lab 02/26/13 1153 02/26/13 1622 02/26/13 1955 02/27/13 0005 02/27/13 0421 02/27/13 0742  GLUCAP 333* 318* 307* 330* 293* 361*   Hemoglobin A1C: No results found for this basename: HGBA1C,  in the last 168 hours Fasting Lipid Panel: No results found for this basename: CHOL, HDL, LDLCALC, TRIG,  CHOLHDL, LDLDIRECT,  in the last 168 hours Thyroid Function Tests: No results found for this basename: TSH, T4TOTAL, FREET4, T3FREE, THYROIDAB,  in the last 168 hours Coagulation: No results found for this basename: LABPROT, INR,  in the last 168 hours Anemia Panel: No results found for this basename: VITAMINB12, FOLATE, FERRITIN, TIBC, IRON, RETICCTPCT,  in the last 168 hours Urine Drug Screen: Drugs of Abuse     Component Value Date/Time   LABOPIA NONE DETECTED 02/25/2013 0237   LABOPIA PPS 06/27/2012 [place] 02/25/2013 0237   COCAINSCRNUR NEG 06/27/2012 1645   LABBENZ POSITIVE* 02/25/2013 0237   LABBENZ PPS 06/27/2012 1645   LABBENZ NEG 07/02/2006 2021   AMPHETMU NONE DETECTED 02/25/2013 0237   AMPHETMU NEG 07/02/2006 2021   THCU NONE DETECTED 02/25/2013 0237    LABBARB NONE DETECTED 02/25/2013 0237   LABBARB NEG 06/27/2012 1645    Alcohol Level: No results found for this basename: ETH,  in the last 168 hours Urinalysis:  Recent Labs Lab 02/24/13 1835 02/25/13 0237  COLORURINE YELLOW YELLOW  LABSPEC 1.022 1.020  PHURINE 6.0 6.5  GLUCOSEU >1000* >1000*  HGBUR NEGATIVE TRACE*  BILIRUBINUR NEGATIVE NEGATIVE  KETONESUR NEGATIVE NEGATIVE  PROTEINUR NEGATIVE NEGATIVE  UROBILINOGEN 0.2 0.2  NITRITE NEGATIVE NEGATIVE  LEUKOCYTESUR MODERATE* SMALL*    Micro Results: Recent Results (from the past 240 hour(s))  URINE CULTURE     Status: None   Collection Time    02/25/13  2:37 AM      Result Value Range Status   Specimen Description URINE, RANDOM   Final   Special Requests NONE   Final   Culture  Setup Time     Final   Value: 02/25/2013 10:35     Performed at [facility]     Final   Value: >=100,000 COLONIES/ML     Performed at [facility]   Culture     Final   Value: Multiple bacterial morphotypes present, none predominant. Suggest appropriate recollection if clinically indicated.     Performed at [facility]   Report Status 02/26/2013 FINAL   Final  MRSA PCR SCREENING     Status: None   Collection Time    02/25/13  6:38 AM      Result Value Range Status   MRSA by PCR NEGATIVE  NEGATIVE Final   Comment:            The GeneXpert MRSA Assay (FDA     approved for NASAL specimens     only), is one component of a     comprehensive MRSA colonization     surveillance program. It is not     intended to diagnose MRSA     infection nor to guide or     monitor treatment for     MRSA infections.   Studies/Results: Mr Kizzie Fantasia Contrast  02/25/2013   CLINICAL DATA:  New onset seizure. History of right frontal meningioma.  EXAM: MRI HEAD WITHOUT AND WITH CONTRAST  TECHNIQUE: Multiplanar, multiecho pulse sequences of the brain and surrounding structures were obtained without and with intravenous  contrast.  CONTRAST:  35mL MULTIHANCE GADOBENATE DIMEGLUMINE 529 MG/ML IV SOLN  COMPARISON:  Head CT same day.  MRI 11/13/2010.  FINDINGS: Since the study of 2012, there has been considerable growth of the extra-axial enhancing mass on the anterior aspect of the anterior cranial fossa. Measurements are now 8 cm cephalocaudal, 5.9 cm  right to left and 3.9 cm front to back maximally. This exerts considerable mass effect upon the anterior aspect of the right hemisphere and there is associated vasogenic edema on the right. There is mass effect upon the ventricular system with distortion. There is right to left shift anteriorly of up to 12 mm. There is a pattern of enhancement throughout the adjacent right frontal brain which looks like enhancement along the penetrating vasculature. This could represent direct invasion by tumor. I can not demonstrate flow within the anterior aspect of the superior sagittal sinus and presumably there is invasion of that structure in that region. Posterior to the region of tumor, the sagittal sinus shows flow.  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 No acute brain infarction is seen.  There is a 2nd small focus of dural enhancement measuring 6 mm along the tentorium on the right, consistent with a small focus of meningioma. This is unchanged from the previous exam.  No evidence of inflammatory sinus disease. Major arterial structures at the base of the brain show flow. No pituitary lesion.  IMPRESSION: Marked enlargement of the extra-axial mass in the anterior aspect of the anterior cranial fossae on the right consistent with meningioma. Measurements today are 8 x 5.9 x 3.9 cm. Considerable mass effect upon the brain with vasogenic edema in the right frontal region. Right-to-left shift of 12 mm. Penetrating type enhancement within the right frontal lobe raises concern for direct invasion of brain.  Seconds small meningioma of the tentorium on the right measuring 6 mm,  unchanged since 2012.   Electronically Signed   By: Nelson Chimes M.D.   On: 02/25/2013 10:39   Dg Chest Port 1 View  02/25/2013   CLINICAL DATA:  Aspiration  EXAM: PORTABLE CHEST - 1 VIEW  COMPARISON:  05/06/2012  FINDINGS: Cardiac shadow is stable. The lungs are well aerated bilaterally. No focal infiltrate or sizable effusion is seen. Postsurgical changes are again noted on the right with some volume loss.  IMPRESSION: Chronic changes without acute abnormality.   Electronically Signed   By: Inez Catalina M.D.   On: 02/25/2013 14:21   Mr Mrv Head Wo Cm  02/26/2013   CLINICAL DATA:  Enlarging meningioma on the right.  EXAM: MR MRV HEAD WITHOUT CONTRAST  TECHNIQUE: Multiplanar, multisequence MR imaging was performed. No intravenous contrast was administered.  COMPARISON:  02/25/2013  FINDINGS: The MR venogram confirms the absence of flow within the anterior 1/3 of the superior sagittal sinus consistent with compression and or invasion by the large anterior cranial fossa extra-axial tumor on the right. Posterior to that region, the sinus shows normal flow. Both transverse and sigmoid sinuses are patent. Both jugular veins are patent. Deep veins and straight sinus are patent.  IMPRESSION: No flow in the anterior 1/3 of the superior sagittal sinus consistent with direct invasion and or compression by the large right anterior cranial fossa extra-axial mass.   Electronically Signed   By: Nelson Chimes M.D.   On: 02/26/2013 12:02   Medications: I have reviewed the patient's current medications. Scheduled Meds: . amLODipine  10 mg Oral Daily  . carvedilol  25 mg Oral BID WC  . dexamethasone  4 mg Oral Q6H  . furosemide  20 mg Oral Daily  . heparin  5,000 Units Subcutaneous Q8H  . insulin aspart  0-20 Units Subcutaneous TID WC  . insulin aspart  5 Units Subcutaneous TID WC  . insulin glargine  25 Units Subcutaneous QHS  .  levETIRAcetam  500 mg Oral BID  . multivitamin with minerals  1 tablet Oral Daily  .  pantoprazole  40 mg Oral Once  . sodium chloride  3 mL Intravenous Q12H   Continuous Infusions:  PRN Meds:.acetaminophen, acetaminophen, feeding supplement (GLUCERNA SHAKE) Assessment/Plan:  Assessment: 80 year old woman who presented on 1/10 with new-onset seizures and found to have exapaneded right frontal meningioma with considerable mass effect and direct invasion of the brain.    New-onset seizures due to meningioma complicated by mass effect (right to left shift 12 mm) with vasogenic edema, direct invasion of the right frontal lobe, and no anterior 1/3 sagittal venous flow  - no seizure activity since 1/10 -Seizure precautions -Frequent neurochecks -Continue Day 3 PO Keppra 500 mg BID   -Continue Day 3 decadron PO 4 mg Q6 hr--> consider BID dosing  for maintenance therapy  -MRV Head 1/11 ---> No flow in the anterior 1/3 of the superior sagittal sinus consistent with direct invasion and or compression by the large right anterior cranial fossa extra-axial mass -Appreciate neurosurgery recommendations (Dr. Luiz Ochoa) -->medically stabilized (better glucose control) before craniotomy (possibly inpt vs outpt) -Lacy Duverney family spokesperson [phone] or [phone].  Family awaiting discussion with Dr. Luiz Ochoa regarding benefits and risk of surgery (per secretary will round tomm 7-9AM?)   Uncontrolled Type I Diabetes Mellitus - CBG 292. Last HbA1c of 12.1 on 12/23/12. Pt at home on 15 U Lantus and 1-5 U BID before lunch and dinner.  -Appreciate diabetes coordinator recommendations  -Increase 25 U to 35 U Lantus daily -Resistant sliding scale with meals -Increase 5U meal time coverage TID to 10 U -Carb-modified diet   Hypertension -  Currently hypertensive. Per neurosurgery to treat hypertension. -Continue home furosemide 20 mg daily  -Continue home amlodipine 10 mg daily -Continue home carvedilol 25 mg BID  -Start lisinopril 5 mg daily if still uncontrolled (renal function, potassium  wnl) -Hydralazine 5 mg PRN SBP>180  Microcytic/Normocytic Anemia - currently stable without active bleeding. Pt with last Hg 10.1 with baseline 10-12. Etiology unknown.  -Continue to monitor for bleeding -Continue to monitor CBC --> stable  -Consider anemia panel  Hyperlipidemia - Last lipid panel 10/2010 with hypercholesteremia (LDL 147 total 213)  -Continue home simvastatin 20 mg daily   GERD - currently without reflux symptoms. Pt on 20 mg protonix at home. -Continue protonix 40 mg daily  Hypokalemia - resolved.  Most likely due to poor PO intake.  -Replete with PO potassium chloride as needed -Continue to monitor BMP   Diet: Carb modified DVT Ppx: SQ Heparin TID Code: Full   Dispo: Disposition is deferred at this time, awaiting improvement of current medical problems.  Anticipated discharge in approximately 3-5 day(s).   The patient does have a current PCP Jerene Pitch, MD) and does need an [facility] hospital follow-up appointment after discharge.  The patient does have transportation limitations that hinder transportation to clinic appointments.  .Services Needed at time of discharge: Y = Yes, Blank = No PT:   OT:   RN:   Equipment:   Other:     LOS: 2 days   Juluis Mire, MD 02/27/2013, 8:01 AM

## 2013-02-27 NOTE — Progress Notes (Addendum)
Inpatient Diabetes Program Recommendations  AACE/ADA: New Consensus Statement on Inpatient Glycemic Control (2013)  Target Ranges:  Prepandial:   less than 140 mg/dL      Peak postprandial:   less than 180 mg/dL (1-2 hours)      Critically ill patients:  140 - 180 mg/dL  Results for LU, PARADISE (MRN [id number]) as of 02/27/2013 14:44  Ref. Range 02/26/2013 19:55 02/27/2013 00:05 02/27/2013 04:21 02/27/2013 07:42 02/27/2013 12:33  Glucose-Capillary Latest Range: 70-99 mg/dL 307 (H) 330 (H) 293 (H) 361 (H) 292 (H)   Patient's CBGs are still elevated >200 therefore for quickest, safest way to get glucose under control my recommendation is to start an insulin gtt per [employer].   The patient can eat while on the insulin gtt.  Please use the IV insulin GlucoStabilizer order set and don't forget to enter a rate for the D51/2 once the glucose is <250 to prevent hypoglycemia.   Will follow. Thank you  Raoul Pitch BSN, RN,CDE Inpatient Diabetes Coordinator [phone] (team pager)

## 2013-02-27 NOTE — Progress Notes (Signed)
Inpatient Diabetes Program Recommendations  AACE/ADA: New Consensus Statement on Inpatient Glycemic Control (2013)  Target Ranges:  Prepandial:   less than 140 mg/dL      Peak postprandial:   less than 180 mg/dL (1-2 hours)      Critically ill patients:  140 - 180 mg/dL  Results for [name], [name] (MRN [id number]) as of 02/27/2013 07:27  Ref. Range 02/26/2013 11:53 02/26/2013 16:22 02/26/2013 19:55 02/27/2013 00:05 02/27/2013 04:21  Glucose-Capillary Latest Range: 70-99 mg/dL 333 (H) 318 (H) 307 (H) 330 (H) 293 (H)    Inpatient Diabetes Program Recommendations Insulin - Basal: consider increasing Lantus to 35 units  Correction (SSI): . Insulin - Meal Coverage: . Thank you  Raoul Pitch BSN, RN,CDE Inpatient Diabetes Coordinator [phone] (team pager)

## 2013-02-28 LAB — GLUCOSE, CAPILLARY
GLUCOSE-CAPILLARY: 301 mg/dL — AB (ref 70–99)
GLUCOSE-CAPILLARY: 248 mg/dL — AB (ref 70–99)
Glucose-Capillary: 268 mg/dL — ABNORMAL HIGH (ref 70–99)
Glucose-Capillary: 301 mg/dL — ABNORMAL HIGH (ref 70–99)
Glucose-Capillary: 310 mg/dL — ABNORMAL HIGH (ref 70–99)
Glucose-Capillary: 314 mg/dL — ABNORMAL HIGH (ref 70–99)

## 2013-02-28 MED ORDER — LISINOPRIL 5 MG PO TABS: 5.0000 mg | ORAL_TABLET | Freq: Every day | ORAL | Status: DC

## 2013-02-28 MED ORDER — LISINOPRIL 5 MG PO TABS
5.0000 mg | ORAL_TABLET | Freq: Every day | ORAL | Status: DC
  Administered 2013-02-28 – 2013-03-07 (×7): 5 mg via ORAL
  Filled 2013-02-28 (×8): qty 1

## 2013-02-28 MED ORDER — INSULIN GLARGINE 100 UNIT/ML ~~LOC~~ SOLN
45.0000 [IU] | Freq: Every day | SUBCUTANEOUS | Status: DC
  Administered 2013-02-28: 45 [IU] via SUBCUTANEOUS
  Filled 2013-02-28 (×2): qty 0.45

## 2013-02-28 MED ORDER — INSULIN ASPART 100 UNIT/ML ~~LOC~~ SOLN
15.0000 [IU] | Freq: Three times a day (TID) | SUBCUTANEOUS | Status: DC
  Administered 2013-02-28 (×2): 15 [IU] via SUBCUTANEOUS

## 2013-02-28 MED ORDER — HYDRALAZINE HCL 20 MG/ML IJ SOLN
5.0000 mg | Freq: Four times a day (QID) | INTRAMUSCULAR | Status: DC | PRN
  Filled 2013-02-28: qty 1

## 2013-02-28 MED ORDER — DEXAMETHASONE 4 MG PO TABS
4.0000 mg | ORAL_TABLET | Freq: Three times a day (TID) | ORAL | Status: DC
  Administered 2013-02-28 – 2013-03-02 (×4): 4 mg via ORAL
  Filled 2013-02-28 (×9): qty 1

## 2013-02-28 NOTE — Progress Notes (Signed)
Patient is currently active with [facility] for chronic disease management services.  Patient has been referred a Geologist, engineering.  We have had difficulty contacting her at home.  Will attempt to verify contact information this admission and verify a secondary contact.  Patient will receive a post discharge transition of care call and will be evaluated for monthly home visits for assessments and disease process education. Of note, [facility] Management services does not replace or interfere with any services that are arranged by inpatient case management or social work.  For additional questions or referrals please contact Corliss Blacker BSN RN [facility] Liaison at [phone].

## 2013-02-28 NOTE — Progress Notes (Signed)
[facility]  Patient Status: Active (receiving services up to time of hospitalization)  [facility] is providing the following services: RN and PT  If patient discharges after hours, please call [phone].   [name] 02/28/2013, 10:39 AM

## 2013-02-28 NOTE — Progress Notes (Signed)
  Date: 02/28/2013  Patient name: [name]  Medical record number: [id number]  Date of birth: 1933-10-31   This patient has been seen and the plan of care was discussed with the house staff. Please see their note for complete details. I concur with their findings with the following additions/corrections:  Agree with Dr. Harley Hallmark plan.  Will initiate insulin drip for tighter control tomorrow if needed.   Sid Falcon, MD 02/28/2013, 2:29 PM

## 2013-02-28 NOTE — Progress Notes (Addendum)
Subjective:  Pt seen and examined. No acute events overnight. No seizure activity. Pt states she is doing well.  She denies headache, vision change, weakness, paraesthesias, or syncope. She denies fever, chills, dyspnea, chest pain, abdominal pain, nausea, vomiting, change in BM or urination. She states that she is leaning towards having surgery. Family would like to speak with neurosurgery to answer questions regarding craniotomy.     Objective: Vital signs in last 24 hours: Filed Vitals:   02/28/13 0800 02/28/13 0900 02/28/13 1000 02/28/13 1100  BP: 164/74 166/88 144/60 144/71  Pulse: 73 73 77 73  Temp:      TempSrc:      Resp: 15 12 16 19   Height:      Weight:      SpO2: 98% 100% 99% 99%   Weight change: 0 kg (0 lb)  Intake/Output Summary (Last 24 hours) at 02/28/13 1131 Last data filed at 02/28/13 0911  Gross per 24 hour  Intake    423 ml  Output    400 ml  Net     23 ml   Constitutional: She is oriented to person, place, and time and well-developed, well-nourished, and in no distress. No distress.  Head: Normocephalic and atraumatic.  Eyes: Conjunctivae and EOM are normal.  Cardiovascular: Normal rate, regular rhythm, normal heart sounds. Exam reveals no gallop and no friction rub. No murmur heard.  Pulmonary/Chest: Effort normal and breath sounds normal. No respiratory distress. She has no wheezes. She has no rales. She exhibits no tenderness.  Abdominal: Soft. Bowel sounds are normal. She exhibits no distension. There is no abdominal tenderness. There is no rigidity, no rebound and no guarding.  Neurological: She is oriented to person, place, and time. She has normal sensation and normal strength. She displays facial symmetry. No cranial nerve deficit.   Extremities: no LE edema   Lab Results: Basic Metabolic Panel:  Recent Labs Lab 02/25/13 0213 02/26/13 0244 02/27/13 0310  NA  --  138 135*  K  --  3.6* 4.1  CL  --  98 97  CO2  --  25 24  GLUCOSE  --   350* 329*  BUN  --  17 22  CREATININE  --  0.85 0.87  CALCIUM  --  8.9 8.5  MG 1.5 2.1  --    Liver Function Tests:  Recent Labs Lab 02/24/13 1602  AST 10  ALT 9  ALKPHOS 82  BILITOT <0.2*  PROT 7.5  ALBUMIN 3.5   No results found for this basename: LIPASE, AMYLASE,  in the last 168 hours No results found for this basename: AMMONIA,  in the last 168 hours CBC:  Recent Labs Lab 02/25/13 0212 02/27/13 0310  WBC 7.4 9.3  HGB 10.1* 10.0*  HCT 32.7* 30.4*  MCV 78.6 77.0*  PLT 305 322   Cardiac Enzymes:  Recent Labs Lab 02/25/13 0213 02/25/13 1015 02/25/13 1445 02/25/13 1958  CKTOTAL 66  --   --   --   TROPONINI  --  <0.30 <0.30 <0.30   BNP: No results found for this basename: PROBNP,  in the last 168 hours D-Dimer: No results found for this basename: DDIMER,  in the last 168 hours CBG:  Recent Labs Lab 02/27/13 1233 02/27/13 1656 02/27/13 2024 02/28/13 0044 02/28/13 0430 02/28/13 0753  GLUCAP 292* 331* 277* 268* 301* 310*   Hemoglobin A1C: No results found for this basename: HGBA1C,  in the last 168 hours Fasting Lipid Panel:  No results found for this basename: CHOL, HDL, LDLCALC, TRIG, CHOLHDL, LDLDIRECT,  in the last 168 hours Thyroid Function Tests: No results found for this basename: TSH, T4TOTAL, FREET4, T3FREE, THYROIDAB,  in the last 168 hours Coagulation: No results found for this basename: LABPROT, INR,  in the last 168 hours Anemia Panel: No results found for this basename: VITAMINB12, FOLATE, FERRITIN, TIBC, IRON, RETICCTPCT,  in the last 168 hours Urine Drug Screen: Drugs of Abuse     Component Value Date/Time   LABOPIA NONE DETECTED 02/25/2013 0237   LABOPIA PPS 06/27/2012 [place] 02/25/2013 0237   COCAINSCRNUR NEG 06/27/2012 1645   LABBENZ POSITIVE* 02/25/2013 0237   LABBENZ PPS 06/27/2012 1645   LABBENZ NEG 07/02/2006 2021   AMPHETMU NONE DETECTED 02/25/2013 0237   AMPHETMU NEG 07/02/2006 2021   THCU NONE  DETECTED 02/25/2013 0237   LABBARB NONE DETECTED 02/25/2013 0237   LABBARB NEG 06/27/2012 1645    Alcohol Level: No results found for this basename: ETH,  in the last 168 hours Urinalysis:  Recent Labs Lab 02/24/13 1835 02/25/13 0237  COLORURINE YELLOW YELLOW  LABSPEC 1.022 1.020  PHURINE 6.0 6.5  GLUCOSEU >1000* >1000*  HGBUR NEGATIVE TRACE*  BILIRUBINUR NEGATIVE NEGATIVE  KETONESUR NEGATIVE NEGATIVE  PROTEINUR NEGATIVE NEGATIVE  UROBILINOGEN 0.2 0.2  NITRITE NEGATIVE NEGATIVE  LEUKOCYTESUR MODERATE* SMALL*    Micro Results: Recent Results (from the past 240 hour(s))  URINE CULTURE     Status: None   Collection Time    02/25/13  2:37 AM      Result Value Range Status   Specimen Description URINE, RANDOM   Final   Special Requests NONE   Final   Culture  Setup Time     Final   Value: 02/25/2013 10:35     Performed at [place]     Final   Value: >=100,000 COLONIES/ML     Performed at [facility]   Culture     Final   Value: Multiple bacterial morphotypes present, none predominant. Suggest appropriate recollection if clinically indicated.     Performed at [facility]   Report Status 02/26/2013 FINAL   Final  MRSA PCR SCREENING     Status: None   Collection Time    02/25/13  6:38 AM      Result Value Range Status   MRSA by PCR NEGATIVE  NEGATIVE Final   Comment:            The GeneXpert MRSA Assay (FDA     approved for NASAL specimens     only), is one component of a     comprehensive MRSA colonization     surveillance program. It is not     intended to diagnose MRSA     infection nor to guide or     monitor treatment for     MRSA infections.   Studies/Results: No results found. Medications: I have reviewed the patient's current medications. Scheduled Meds: . amLODipine  10 mg Oral Daily  . carvedilol  25 mg Oral BID WC  . dexamethasone  4 mg Oral Q6H  . furosemide  20 mg Oral Daily  . heparin  5,000 Units  Subcutaneous Q8H  . insulin aspart  0-20 Units Subcutaneous TID WC  . insulin aspart  15 Units Subcutaneous TID WC  . insulin glargine  45 Units Subcutaneous QHS  . levETIRAcetam  500 mg Oral BID  .  lisinopril  5 mg Oral Daily  . multivitamin with minerals  1 tablet Oral Daily  . sodium chloride  3 mL Intravenous Q12H   Continuous Infusions:  PRN Meds:.acetaminophen, acetaminophen, feeding supplement (GLUCERNA SHAKE), hydrALAZINE Assessment/Plan:  Assessment: 80 year old woman who presented on 1/10 with new-onset seizures and found to have exapaneded right frontal meningioma with considerable mass effect and direct invasion of the brain.    New-onset seizures due to meningioma complicated by mass effect (right to left shift 12 mm) with vasogenic edema, direct invasion of the right frontal lobe, and no anterior 1/3 superior sagittal venous flow  - no seizure activity since 1/10 -Seizure precautions -Frequent neurochecks -Continue Day 4 PO Keppra 500 mg BID   -Continue Day 4 PO Decadron 4 mg Q6 hr, per neurosurgery TID dosing ok   -Appreciate neurosurgery recommendations -->medically stabilized (better glucose control) before craniotomy (possibly Thursday) -Lacy Duverney family spokesperson [phone] or [phone].  Family awaiting discussion with Dr. Christella Noa regarding benefits and risk of surgery   Uncontrolled Type I Diabetes Mellitus - CBG 314. Last HbA1c of 12.1 on 12/23/12. Pt at home on 15 U Lantus and 1-5 U BID before lunch and dinner.  -Appreciate diabetes coordinator recommendations  -Increase 35 U to 45 U Lantus daily -Resistant sliding scale with meals -Increase 10U meal time coverage TID to 15 U -Will start insulin drip tomm in AM if still uncontrolled -Carb-modified diet   Hypertension -  Improving. Currently hypertensive. Per neurosurgery to treat hypertension. -Continue home furosemide 20 mg daily  -Continue home amlodipine 10 mg daily -Continue home carvedilol 25 mg BID    -Continue lisinopril 5 mg daily  -Hydralazine 5 mg PRN SBP>180/100  Microcytic/Normocytic Anemia - currently stable without active bleeding. Pt with last Hg 10.1 with baseline 10-12. Etiology unknown.  -Continue to monitor for bleeding -Consider anemia panel  Hyperlipidemia - Last lipid panel 10/2010 with hypercholesteremia (LDL 147 total 213)  -Continue home simvastatin 20 mg daily   GERD - currently without reflux symptoms. Pt on 20 mg protonix at home. -Continue protonix 40 mg daily  Hypokalemia - resolved.  Most likely due to poor PO intake.  -Replete with PO potassium chloride as needed -Continue to monitor BMP   Diet: Carb modified DVT Ppx: SQ Heparin TID Code: Full   Dispo: Disposition is deferred at this time, awaiting improvement of current medical problems.  Anticipated discharge in approximately 3-5 day(s).   The patient does have a current PCP Jerene Pitch, MD) and does need an [facility] hospital follow-up appointment after discharge.  The patient does have transportation limitations that hinder transportation to clinic appointments.  .Services Needed at time of discharge: Y = Yes, Blank = No PT:   OT:   RN:   Equipment:   Other:     LOS: 3 days   Juluis Mire, MD 02/28/2013, 11:31 AM

## 2013-02-28 NOTE — Progress Notes (Signed)
Patient ID: [name], female   DOB: [birth year], 80 y.o.   MRN: [id number] BP 137/50  Pulse 72  Temp(Src) 97.8 F (36.6 C) (Oral)  Resp 18  Ht 5\' 4"  (1.626 m)  Wt 86.4 kg (190 lb 7.6 oz)  BMI 32.68 kg/m2  SpO2 98% Alert and oriented x 4 Perrl, full eom Moving all extremities Plan for OR Thursday, bifrontal cranoiotomy for tumor resection. Risks, including bleeding, infection, stroke, coma, death, inability to resect tumor, brain damage, weakness, change in personality, seizures, difficulty with speech were discussed. She and their family understands and wishes to proceed.

## 2013-03-01 LAB — GLUCOSE, CAPILLARY
GLUCOSE-CAPILLARY: 391 mg/dL — AB (ref 70–99)
GLUCOSE-CAPILLARY: 308 mg/dL — AB (ref 70–99)
GLUCOSE-CAPILLARY: 278 mg/dL — AB (ref 70–99)
Glucose-Capillary: 238 mg/dL — ABNORMAL HIGH (ref 70–99)
Glucose-Capillary: 402 mg/dL — ABNORMAL HIGH (ref 70–99)
Glucose-Capillary: 297 mg/dL — ABNORMAL HIGH (ref 70–99)
Glucose-Capillary: 226 mg/dL — ABNORMAL HIGH (ref 70–99)

## 2013-03-01 LAB — BASIC METABOLIC PANEL
BUN: 29 mg/dL — ABNORMAL HIGH (ref 6–23)
CHLORIDE: 99 meq/L (ref 96–112)
CO2: 23 mEq/L (ref 19–32)
CREATININE: 1.02 mg/dL (ref 0.50–1.10)
Calcium: 8.5 mg/dL (ref 8.4–10.5)
GFR calc non Af Amer: 52 mL/min — ABNORMAL LOW (ref >90)
GFR, EST AFRICAN AMERICAN: 60 mL/min — AB (ref >90)
Glucose, Bld: 414 mg/dL — ABNORMAL HIGH (ref 70–99)
Potassium: 4.5 mEq/L (ref 3.7–5.3)
SODIUM: 137 meq/L (ref 137–147)

## 2013-03-01 MED ORDER — INSULIN REGULAR BOLUS VIA INFUSION
0.0000 [IU] | Freq: Three times a day (TID) | INTRAVENOUS | Status: DC
  Filled 2013-03-01: qty 10

## 2013-03-01 MED ORDER — INSULIN ASPART 100 UNIT/ML ~~LOC~~ SOLN: 20.0000 [IU] | Freq: Three times a day (TID) | SUBCUTANEOUS | Status: DC

## 2013-03-01 MED ORDER — DEXTROSE-NACL 5-0.45 % IV SOLN
INTRAVENOUS | Status: DC
  Administered 2013-03-02: 01:00:00 via INTRAVENOUS

## 2013-03-01 MED ORDER — DEXTROSE 50 % IV SOLN: 25.0000 mL | INTRAVENOUS | Status: DC | PRN

## 2013-03-01 MED ORDER — SODIUM CHLORIDE 0.9 % IV SOLN
INTRAVENOUS | Status: DC
  Administered 2013-03-01: 20:00:00 via INTRAVENOUS

## 2013-03-01 MED ORDER — SODIUM CHLORIDE 0.9 % IV SOLN
INTRAVENOUS | Status: DC
  Filled 2013-03-01: qty 1

## 2013-03-01 MED ORDER — FENTANYL CITRATE 0.05 MG/ML IJ SOLN
100.0000 ug | Freq: Once | INTRAMUSCULAR | Status: AC
  Administered 2013-03-01: 100 ug via INTRAVENOUS
  Filled 2013-03-01: qty 2

## 2013-03-01 NOTE — Progress Notes (Signed)
Subjective:  Pt seen and examined. No acute events overnight. No reported seizure activity. Pt is eating breakfast and reports good appetite.  She denies headache, vision change, weakness, paraesthesias, or syncope. She also denies fever, chills, dyspnea, chest pain, abdominal pain, nausea, vomiting, change in BM or urination. She is to have surgery on Thursday.     Objective: Vital signs in last 24 hours: Filed Vitals:   02/28/13 1922 02/28/13 2154 03/01/13 0140 03/01/13 0543  BP: 137/50 141/56 172/65 171/61  Pulse: 72 70 68 64  Temp: 97.8 F (36.6 C) 98.4 F (36.9 C) 97.7 F (36.5 C) 97.9 F (36.6 C)  TempSrc: Oral Oral Oral Oral  Resp: 18 20 20 20   Height: 5\' 4"  (1.626 m)     Weight: 86.4 kg (190 lb 7.6 oz)     SpO2: 98% 96% 97% 99%   Weight change: 0.4 kg (14.1 oz)  Intake/Output Summary (Last 24 hours) at 03/01/13 2536 Last data filed at 02/28/13 1500  Gross per 24 hour  Intake    360 ml  Output    850 ml  Net   -490 ml   Constitutional: She is oriented to person, place, and time and well-developed, well-nourished, and in no distress. No distress.  Head: Normocephalic and atraumatic.  Eyes: Conjunctivae and EOM are normal.  Cardiovascular: Normal rate, regular rhythm, normal heart sounds. Exam reveals no gallop and no friction rub. No murmur heard.  Pulmonary/Chest: Effort normal and breath sounds normal. No respiratory distress. She has no wheezes. She has no rales. She exhibits no tenderness.  Abdominal: Soft. Bowel sounds are normal. She exhibits no distension. There is no abdominal tenderness. There is no rigidity, no rebound and no guarding.  Neurological: She is oriented to person, place, and time. She has normal sensation and normal strength. She displays facial symmetry. No cranial nerve deficit.   Extremities: no LE edema   Lab Results: Basic Metabolic Panel:  Recent Labs Lab 02/25/13 0213 02/26/13 0244 02/27/13 0310  NA  --  138 135*  K  --   3.6* 4.1  CL  --  98 97  CO2  --  25 24  GLUCOSE  --  350* 329*  BUN  --  17 22  CREATININE  --  0.85 0.87  CALCIUM  --  8.9 8.5  MG 1.5 2.1  --    Liver Function Tests:  Recent Labs Lab 02/24/13 1602  AST 10  ALT 9  ALKPHOS 82  BILITOT <0.2*  PROT 7.5  ALBUMIN 3.5   No results found for this basename: LIPASE, AMYLASE,  in the last 168 hours No results found for this basename: AMMONIA,  in the last 168 hours CBC:  Recent Labs Lab 02/25/13 0212 02/27/13 0310  WBC 7.4 9.3  HGB 10.1* 10.0*  HCT 32.7* 30.4*  MCV 78.6 77.0*  PLT 305 322   Cardiac Enzymes:  Recent Labs Lab 02/25/13 0213 02/25/13 1015 02/25/13 1445 02/25/13 1958  CKTOTAL 31  --   --   --   TROPONINI  --  <0.30 <0.30 <0.30   BNP: No results found for this basename: PROBNP,  in the last 168 hours D-Dimer: No results found for this basename: DDIMER,  in the last 168 hours CBG:  Recent Labs Lab 02/28/13 0430 02/28/13 0753 02/28/13 1145 02/28/13 1634 02/28/13 2018 03/01/13 0616  GLUCAP 301* 310* 314* 301* 248* 238*   Hemoglobin A1C: No results found for this basename: HGBA1C,  in the last 168 hours Fasting Lipid Panel: No results found for this basename: CHOL, HDL, LDLCALC, TRIG, CHOLHDL, LDLDIRECT,  in the last 168 hours Thyroid Function Tests: No results found for this basename: TSH, T4TOTAL, FREET4, T3FREE, THYROIDAB,  in the last 168 hours Coagulation: No results found for this basename: LABPROT, INR,  in the last 168 hours Anemia Panel: No results found for this basename: VITAMINB12, FOLATE, FERRITIN, TIBC, IRON, RETICCTPCT,  in the last 168 hours Urine Drug Screen: Drugs of Abuse     Component Value Date/Time   LABOPIA NONE DETECTED 02/25/2013 0237   LABOPIA PPS 06/27/2012 [place] 02/25/2013 0237   COCAINSCRNUR NEG 06/27/2012 1645   LABBENZ POSITIVE* 02/25/2013 0237   LABBENZ PPS 06/27/2012 1645   LABBENZ NEG 07/02/2006 2021   AMPHETMU NONE DETECTED  02/25/2013 0237   AMPHETMU NEG 07/02/2006 2021   THCU NONE DETECTED 02/25/2013 0237   LABBARB NONE DETECTED 02/25/2013 0237   LABBARB NEG 06/27/2012 1645    Alcohol Level: No results found for this basename: ETH,  in the last 168 hours Urinalysis:  Recent Labs Lab 02/24/13 1835 02/25/13 0237  COLORURINE YELLOW YELLOW  LABSPEC 1.022 1.020  PHURINE 6.0 6.5  GLUCOSEU >1000* >1000*  HGBUR NEGATIVE TRACE*  BILIRUBINUR NEGATIVE NEGATIVE  KETONESUR NEGATIVE NEGATIVE  PROTEINUR NEGATIVE NEGATIVE  UROBILINOGEN 0.2 0.2  NITRITE NEGATIVE NEGATIVE  LEUKOCYTESUR MODERATE* SMALL*    Micro Results: Recent Results (from the past 240 hour(s))  URINE CULTURE     Status: None   Collection Time    02/25/13  2:37 AM      Result Value Range Status   Specimen Description URINE, RANDOM   Final   Special Requests NONE   Final   Culture  Setup Time     Final   Value: 02/25/2013 10:35     Performed at [place]     Final   Value: >=100,000 COLONIES/ML     Performed at [facility]   Culture     Final   Value: Multiple bacterial morphotypes present, none predominant. Suggest appropriate recollection if clinically indicated.     Performed at [facility]   Report Status 02/26/2013 FINAL   Final  MRSA PCR SCREENING     Status: None   Collection Time    02/25/13  6:38 AM      Result Value Range Status   MRSA by PCR NEGATIVE  NEGATIVE Final   Comment:            The GeneXpert MRSA Assay (FDA     approved for NASAL specimens     only), is one component of a     comprehensive MRSA colonization     surveillance program. It is not     intended to diagnose MRSA     infection nor to guide or     monitor treatment for     MRSA infections.   Studies/Results: No results found. Medications: I have reviewed the patient's current medications. Scheduled Meds: . amLODipine  10 mg Oral Daily  . carvedilol  25 mg Oral BID WC  . dexamethasone  4 mg Oral Q8H  .  furosemide  20 mg Oral Daily  . heparin  5,000 Units Subcutaneous Q8H  . insulin aspart  0-20 Units Subcutaneous TID WC  . insulin aspart  15 Units Subcutaneous TID WC  . insulin glargine  45 Units Subcutaneous QHS  .  levETIRAcetam  500 mg Oral BID  . lisinopril  5 mg Oral Daily  . multivitamin with minerals  1 tablet Oral Daily  . sodium chloride  3 mL Intravenous Q12H   Continuous Infusions:  PRN Meds:.acetaminophen, acetaminophen, feeding supplement (GLUCERNA SHAKE), hydrALAZINE Assessment/Plan:  Assessment: 80 year old woman who presented on 1/10 with new-onset seizures and found to have exapaneded right frontal meningioma with considerable mass effect and direct invasion of the brain.    New-onset seizures due to meningioma complicated by mass effect (right to left shift 12 mm) with vasogenic edema, direct invasion of the right frontal lobe, and no anterior 1/3 superior sagittal venous flow  - no seizure activity since 1/10 -Appreciate neurosurgery recommendations -->per Dr. Christella Noa, to undergo bifrontal craniotomy for removal of meningioma on Thursday  -Seizure precautions -Frequent neurochecks -Continue Day 5 PO Keppra 500 mg BID, will need to continue after   -Continue Day 5 PO Decadron 4 mg TID    -Lacy Duverney family spokesperson [phone] or [phone]  Uncontrolled Type I Diabetes Mellitus - CBG 391 Last HbA1c of 12.1 on 12/23/12. Pt at home on 15 U Lantus and 1-5 U BID before lunch and dinner.  -Yesterday required 45 U ISS, 45 U Lantus, 45U with meals -Appreciate diabetes coordinator recommendations -->insulin gtt per [employer] -Start insulin drip for better glucose control for surgery tomm -Carb-modified diet   Hypertension -  Improving. Currently normotensive. Per neurosurgery to treat hypertension. -Continue home furosemide 20 mg daily  -Continue home amlodipine 10 mg daily -Continue home carvedilol 25 mg BID  -Continue lisinopril 5 mg daily  -Hydralazine 5 mg  PRN SBP>180/100  Microcytic/Normocytic Anemia - currently stable without active bleeding. Pt with last Hg 10.1 with baseline 10-12. Etiology unknown.  -Continue to monitor CBC -Continue to monitor for bleeding -Consider anemia panel  Hyperlipidemia - Last lipid panel 10/2010 with hypercholesteremia (LDL 147 total 213)  -Continue home simvastatin 20 mg daily   GERD - currently without reflux symptoms. Pt on 20 mg protonix at home. -Continue protonix 40 mg daily  Hypokalemia - resolved.  Most likely due to poor PO intake.  -Replete with PO potassium chloride as needed -Continue to monitor BMP   Diet: Carb modified DVT Ppx: SQ Heparin TID Code: Full   Dispo: Disposition is deferred at this time, awaiting improvement of current medical problems.  Anticipated discharge in approximately 3-5 day(s).   The patient does have a current PCP Jerene Pitch, MD) and does need an [facility] hospital follow-up appointment after discharge.  The patient does have transportation limitations that hinder transportation to clinic appointments.  .Services Needed at time of discharge: Y = Yes, Blank = No PT:   OT:   RN:   Equipment:   Other:     LOS: 4 days   Juluis Mire, MD 03/01/2013, 8:23 AM

## 2013-03-01 NOTE — Progress Notes (Addendum)
Inpatient Diabetes Program Recommendations  AACE/ADA: New Consensus Statement on Inpatient Glycemic Control (2013)  Target Ranges:  Prepandial:   less than 140 mg/dL      Peak postprandial:   less than 180 mg/dL (1-2 hours)      Critically ill patients:  140 - 180 mg/dL     Results for DEKLYNN, CHARLET (MRN [id number]) as of 03/01/2013 13:47  Ref. Range 02/28/2013 07:53 02/28/2013 11:45 02/28/2013 16:34 02/28/2013 20:18  Glucose-Capillary Latest Range: 70-99 mg/dL 310 (H) 314 (H) 301 (H) 248 (H)    Results for FAATIMA, TENCH (MRN [id number]) as of 03/01/2013 13:47  Ref. Range 03/01/2013 06:16 03/01/2013 11:22  Glucose-Capillary Latest Range: 70-99 mg/dL 238 (H) 391 (H)    **Called by Dr. Otelia Sergeant this morning about this patient.  CBGs still uncontrolled despite increases of insulin (both basal and bolus).  Patient scheduled for neurosurgery tomorrow.  MD would like patient's CBGs to be under better control for surgery.  Recommend IV insulin drip for surgery.    **Noted IV insulin drip ordered to start today.  As of 1351, insulin drip not yet in board.  Will call RN to check the status of the insulin drip.  **Spoke with RN this morning (~11:45am).  Explained to RN how to pull up [employer] program and how to initiate the program.  Also explained to RN that patient will need to have meal coverage for dinner and that we do have the capability of covering patient's carbohydrate intake with an IV bolus of insulin per the GlucoStablizer order set.  Gave RN the Inpt DM Program's pager number and asked her to call us with questions.   Will follow. Wyn Quaker RN, MSN, CDE Diabetes Coordinator Inpatient Diabetes Program Team Pager: [phone] (8a-10p)   Addendum 1530: Noted patient still not on IV insulin drip as of yet.  Called RN caring for patient.  Assisted RN with setting up [place] over the phone.  IV insulin drip to start immediately.

## 2013-03-01 NOTE — Progress Notes (Signed)
Pt started on insulin drip. Glucose Stabilizer used to monitor and determine drip rate. Pt is alert and oriented. Initial glucose reading was 402. Insulin drip administered. Rechecked in 1hr and glucose was 297. Insulin drip adjusted. Pt continues to tolerate well. RN will continue to monitor.

## 2013-03-01 NOTE — Progress Notes (Signed)
Patient ID: [name], female   DOB: 07-14-1933, 80 y.o.   MRN: [id number] BP 125/84  Pulse 65  Temp(Src) 97.8 F (36.6 C) (Oral)  Resp 14  Ht 5\' 4"  (1.626 m)  Wt 86.4 kg (190 lb 7.6 oz)  BMI 32.68 kg/m2  SpO2 99% Alert and oriented For surgery tomorrow Speech is clear and fluent Moving all extrneities.

## 2013-03-01 NOTE — Progress Notes (Signed)
  Date: 03/01/2013  Patient name: [name]  Medical record number: [id number]  Date of birth: 22-Apr-1933   This patient has been seen and the plan of care was discussed with the house staff. Please see their note for complete details. I concur with their findings with the following additions/corrections:  Agree with insulin drip for better glucose control.  Neurosurgery planned for tomorrow.  For BP, will uptitrate medications for better effect.   Sid Falcon, MD 03/01/2013, 11:46 AM

## 2013-03-02 ENCOUNTER — Encounter (HOSPITAL_COMMUNITY)
Admission: EM | Disposition: A | Discharge status: Home Health | Payer: Self-pay | Source: Home / Self Care | Attending: Internal Medicine

## 2013-03-02 ENCOUNTER — Encounter (HOSPITAL_COMMUNITY): Payer: Self-pay | Admitting: Anesthesiology

## 2013-03-02 ENCOUNTER — Inpatient Hospital Stay (HOSPITAL_COMMUNITY): Payer: Medicare Other

## 2013-03-02 ENCOUNTER — Encounter: Payer: Self-pay | Admitting: Internal Medicine

## 2013-03-02 ENCOUNTER — Inpatient Hospital Stay (HOSPITAL_COMMUNITY): Payer: Medicare Other | Admitting: Anesthesiology

## 2013-03-02 ENCOUNTER — Encounter (HOSPITAL_COMMUNITY): Payer: Medicare Other | Admitting: Anesthesiology

## 2013-03-02 DIAGNOSIS — D649 Anemia, unspecified: Secondary | ICD-10-CM

## 2013-03-02 DIAGNOSIS — I509 Heart failure, unspecified: Secondary | ICD-10-CM

## 2013-03-02 DIAGNOSIS — D320 Benign neoplasm of cerebral meninges: Secondary | ICD-10-CM | POA: Diagnosis present

## 2013-03-02 DIAGNOSIS — I5022 Chronic systolic (congestive) heart failure: Secondary | ICD-10-CM

## 2013-03-02 DIAGNOSIS — R569 Unspecified convulsions: Secondary | ICD-10-CM

## 2013-03-02 HISTORY — PX: CRANIOTOMY: SHX93

## 2013-03-02 LAB — GLUCOSE, CAPILLARY
GLUCOSE-CAPILLARY: 160 mg/dL — AB (ref 70–99)
GLUCOSE-CAPILLARY: 190 mg/dL — AB (ref 70–99)
GLUCOSE-CAPILLARY: 250 mg/dL — AB (ref 70–99)
Glucose-Capillary: 122 mg/dL — ABNORMAL HIGH (ref 70–99)
Glucose-Capillary: 130 mg/dL — ABNORMAL HIGH (ref 70–99)
Glucose-Capillary: 130 mg/dL — ABNORMAL HIGH (ref 70–99)
Glucose-Capillary: 158 mg/dL — ABNORMAL HIGH (ref 70–99)
Glucose-Capillary: 163 mg/dL — ABNORMAL HIGH (ref 70–99)
Glucose-Capillary: 164 mg/dL — ABNORMAL HIGH (ref 70–99)
Glucose-Capillary: 174 mg/dL — ABNORMAL HIGH (ref 70–99)
Glucose-Capillary: 190 mg/dL — ABNORMAL HIGH (ref 70–99)
Glucose-Capillary: 204 mg/dL — ABNORMAL HIGH (ref 70–99)

## 2013-03-02 LAB — BASIC METABOLIC PANEL
BUN: 28 mg/dL — ABNORMAL HIGH (ref 6–23)
CALCIUM: 8.4 mg/dL (ref 8.4–10.5)
CO2: 28 mEq/L (ref 19–32)
Chloride: 100 mEq/L (ref 96–112)
Creatinine, Ser: 0.93 mg/dL (ref 0.50–1.10)
GFR calc Af Amer: 67 mL/min — ABNORMAL LOW (ref >90)
GFR, EST NON AFRICAN AMERICAN: 58 mL/min — AB (ref >90)
Glucose, Bld: 144 mg/dL — ABNORMAL HIGH (ref 70–99)
Potassium: 4.2 mEq/L (ref 3.7–5.3)
SODIUM: 139 meq/L (ref 137–147)

## 2013-03-02 LAB — APTT: APTT: 30 s (ref 24–37)

## 2013-03-02 LAB — PREPARE RBC (CROSSMATCH)

## 2013-03-02 LAB — POCT I-STAT 4, (NA,K, GLUC, HGB,HCT)
GLUCOSE: 288 mg/dL — AB (ref 70–99)
GLUCOSE: 294 mg/dL — AB (ref 70–99)
Glucose, Bld: 53 mg/dL — ABNORMAL LOW (ref 70–99)
Glucose, Bld: 268 mg/dL — ABNORMAL HIGH (ref 70–99)
Glucose, Bld: 257 mg/dL — ABNORMAL HIGH (ref 70–99)
HCT: 38 % (ref 36.0–46.0)
HCT: 30 % — ABNORMAL LOW (ref 36.0–46.0)
HEMATOCRIT: 29 % — AB (ref 36.0–46.0)
HEMATOCRIT: 30 % — AB (ref 36.0–46.0)
HEMATOCRIT: 32 % — AB (ref 36.0–46.0)
HEMOGLOBIN: 10.2 g/dL — AB (ref 12.0–15.0)
HEMOGLOBIN: 12.9 g/dL (ref 12.0–15.0)
HEMOGLOBIN: 10.9 g/dL — AB (ref 12.0–15.0)
Hemoglobin: 9.9 g/dL — ABNORMAL LOW (ref 12.0–15.0)
Hemoglobin: 10.2 g/dL — ABNORMAL LOW (ref 12.0–15.0)
POTASSIUM: 4.6 meq/L (ref 3.7–5.3)
POTASSIUM: 4.6 meq/L (ref 3.7–5.3)
POTASSIUM: 4.3 meq/L (ref 3.7–5.3)
Potassium: 4.8 mEq/L (ref 3.7–5.3)
Potassium: 4.4 mEq/L (ref 3.7–5.3)
SODIUM: 137 meq/L (ref 137–147)
Sodium: 136 mEq/L — ABNORMAL LOW (ref 137–147)
Sodium: 137 mEq/L (ref 137–147)
Sodium: 139 mEq/L (ref 137–147)
Sodium: 140 mEq/L (ref 137–147)

## 2013-03-02 LAB — POCT I-STAT 7, (LYTES, BLD GAS, ICA,H+H)
ACID-BASE DEFICIT: 2 mmol/L (ref 0.0–2.0)
Bicarbonate: 22 mEq/L (ref 20.0–24.0)
CALCIUM ION: 1.01 mmol/L — AB (ref 1.13–1.30)
HEMATOCRIT: 33 % — AB (ref 36.0–46.0)
Hemoglobin: 11.2 g/dL — ABNORMAL LOW (ref 12.0–15.0)
O2 SAT: 100 %
POTASSIUM: 4.4 meq/L (ref 3.7–5.3)
Patient temperature: 36.4
SODIUM: 139 meq/L (ref 137–147)
TCO2: 23 mmol/L (ref 0–100)
pCO2 arterial: 34.3 mmHg — ABNORMAL LOW (ref 35.0–45.0)
pH, Arterial: 7.412 (ref 7.350–7.450)
pO2, Arterial: 237 mmHg — ABNORMAL HIGH (ref 80.0–100.0)

## 2013-03-02 LAB — PROTIME-INR
INR: 1.09 (ref 0.00–1.49)
Prothrombin Time: 13.9 seconds (ref 11.6–15.2)

## 2013-03-02 LAB — CBC
HCT: 34.6 % — ABNORMAL LOW (ref 36.0–46.0)
Hemoglobin: 10.8 g/dL — ABNORMAL LOW (ref 12.0–15.0)
MCH: 24.4 pg — AB (ref 26.0–34.0)
MCHC: 31.2 g/dL (ref 30.0–36.0)
MCV: 78.1 fL (ref 78.0–100.0)
Platelets: 315 10*3/uL (ref 150–400)
RBC: 4.43 MIL/uL (ref 3.87–5.11)
RDW: 15.9 % — ABNORMAL HIGH (ref 11.5–15.5)
WBC: 13 10*3/uL — ABNORMAL HIGH (ref 4.0–10.5)

## 2013-03-02 SURGERY — CRANIOTOMY TUMOR EXCISION
Anesthesia: General | Site: Head

## 2013-03-02 MED ORDER — DEXTROSE-NACL 5-0.9 % IV SOLN
INTRAVENOUS | Status: DC
  Administered 2013-03-02: 1 mL via INTRAVENOUS

## 2013-03-02 MED ORDER — CEFAZOLIN SODIUM-DEXTROSE 2-3 GM-% IV SOLR
2.0000 g | Freq: Three times a day (TID) | INTRAVENOUS | Status: DC
  Administered 2013-03-03 – 2013-03-04 (×5): 2 g via INTRAVENOUS
  Filled 2013-03-02 (×8): qty 50

## 2013-03-02 MED ORDER — DEXTROSE 50 % IV SOLN
25.0000 mL | INTRAVENOUS | Status: DC | PRN
Start: 2013-03-02 — End: 2013-03-03

## 2013-03-02 MED ORDER — EPHEDRINE SULFATE 50 MG/ML IJ SOLN
INTRAMUSCULAR | Status: DC | PRN
  Administered 2013-03-02: 10 mg via INTRAVENOUS
  Administered 2013-03-02: 5 mg via INTRAVENOUS
  Administered 2013-03-02: 10 mg via INTRAVENOUS

## 2013-03-02 MED ORDER — DEXAMETHASONE 6 MG PO TABS
6.0000 mg | ORAL_TABLET | Freq: Four times a day (QID) | ORAL | Status: DC
  Filled 2013-03-02 (×3): qty 1

## 2013-03-02 MED ORDER — BISACODYL 10 MG RE SUPP: 10.0000 mg | Freq: Every day | RECTAL | Status: DC | PRN

## 2013-03-02 MED ORDER — LABETALOL HCL 5 MG/ML IV SOLN
10.0000 mg | INTRAVENOUS | Status: DC | PRN
  Administered 2013-03-03 (×3): 10 mg via INTRAVENOUS
  Administered 2013-03-03: 20 mg via INTRAVENOUS
  Filled 2013-03-02 (×4): qty 4

## 2013-03-02 MED ORDER — PANTOPRAZOLE SODIUM 40 MG IV SOLR
40.0000 mg | Freq: Every day | INTRAVENOUS | Status: DC
  Administered 2013-03-02: 40 mg via INTRAVENOUS
  Filled 2013-03-02 (×2): qty 40

## 2013-03-02 MED ORDER — DEXAMETHASONE SODIUM PHOSPHATE 10 MG/ML IJ SOLN
INTRAMUSCULAR | Status: AC
  Administered 2013-03-02: 10 mg via INTRAVENOUS
  Filled 2013-03-02: qty 1

## 2013-03-02 MED ORDER — CEFAZOLIN SODIUM-DEXTROSE 2-3 GM-% IV SOLR
INTRAVENOUS | Status: AC
  Filled 2013-03-02: qty 50

## 2013-03-02 MED ORDER — ROCURONIUM BROMIDE 100 MG/10ML IV SOLN
INTRAVENOUS | Status: DC | PRN
  Administered 2013-03-02: 50 mg via INTRAVENOUS

## 2013-03-02 MED ORDER — ACETAMINOPHEN 650 MG RE SUPP: 650.0000 mg | RECTAL | Status: DC | PRN

## 2013-03-02 MED ORDER — FENTANYL CITRATE 0.05 MG/ML IJ SOLN
INTRAMUSCULAR | Status: DC | PRN
  Administered 2013-03-02: 25 ug via INTRAVENOUS
  Administered 2013-03-02 (×7): 50 ug via INTRAVENOUS

## 2013-03-02 MED ORDER — INSULIN ASPART 100 UNIT/ML ~~LOC~~ SOLN
6.0000 [IU] | Freq: Once | SUBCUTANEOUS | Status: AC
  Administered 2013-03-02: 5 [IU] via SUBCUTANEOUS
  Administered 2013-03-02: 6 [IU] via SUBCUTANEOUS

## 2013-03-02 MED ORDER — ARTIFICIAL TEARS OP OINT
TOPICAL_OINTMENT | OPHTHALMIC | Status: DC | PRN
  Administered 2013-03-02: 1 via OPHTHALMIC

## 2013-03-02 MED ORDER — ONDANSETRON HCL 4 MG/2ML IJ SOLN
INTRAMUSCULAR | Status: DC | PRN
  Administered 2013-03-02: 4 mg via INTRAVENOUS

## 2013-03-02 MED ORDER — CEFAZOLIN SODIUM-DEXTROSE 2-3 GM-% IV SOLR
INTRAVENOUS | Status: AC
  Administered 2013-03-02 (×2): 2 g via INTRAVENOUS
  Filled 2013-03-02: qty 50

## 2013-03-02 MED ORDER — SODIUM CHLORIDE 0.9 % IR SOLN
Status: DC | PRN
  Administered 2013-03-02 (×3): 1000 mL

## 2013-03-02 MED ORDER — SODIUM CHLORIDE 0.9 % IV SOLN
INTRAVENOUS | Status: DC | PRN
  Administered 2013-03-02: 12:00:00 via INTRAVENOUS

## 2013-03-02 MED ORDER — BACITRACIN ZINC 500 UNIT/GM EX OINT
TOPICAL_OINTMENT | CUTANEOUS | Status: DC | PRN
  Administered 2013-03-02: 1 via TOPICAL

## 2013-03-02 MED ORDER — DEXTROSE-NACL 5-0.45 % IV SOLN: INTRAVENOUS | Status: DC

## 2013-03-02 MED ORDER — ONDANSETRON HCL 4 MG/2ML IJ SOLN
4.0000 mg | INTRAMUSCULAR | Status: DC | PRN
  Administered 2013-03-02: 4 mg via INTRAVENOUS
  Filled 2013-03-02: qty 2

## 2013-03-02 MED ORDER — LABETALOL HCL 5 MG/ML IV SOLN
INTRAVENOUS | Status: DC | PRN
  Administered 2013-03-02 (×4): 5 mg via INTRAVENOUS

## 2013-03-02 MED ORDER — THROMBIN 20000 UNITS EX SOLR
CUTANEOUS | Status: DC | PRN
  Administered 2013-03-02 (×3): via TOPICAL

## 2013-03-02 MED ORDER — SODIUM CHLORIDE 0.9 % IV SOLN
500.0000 mg | Freq: Two times a day (BID) | INTRAVENOUS | Status: DC
  Administered 2013-03-02 – 2013-03-05 (×6): 500 mg via INTRAVENOUS
  Filled 2013-03-02 (×7): qty 5

## 2013-03-02 MED ORDER — SODIUM CHLORIDE 0.9 % IV SOLN
INTRAVENOUS | Status: DC | PRN
  Administered 2013-03-02 (×2): via INTRAVENOUS

## 2013-03-02 MED ORDER — SODIUM CHLORIDE 0.9 % IV SOLN
INTRAVENOUS | Status: DC
  Administered 2013-03-02: 1.9 [IU]/h via INTRAVENOUS
  Filled 2013-03-02: qty 1

## 2013-03-02 MED ORDER — ACETAMINOPHEN 325 MG PO TABS: 650.0000 mg | ORAL_TABLET | ORAL | Status: DC | PRN

## 2013-03-02 MED ORDER — PHENYLEPHRINE HCL 10 MG/ML IJ SOLN
10.0000 mg | INTRAVENOUS | Status: DC | PRN
  Administered 2013-03-02: 15 ug/min via INTRAVENOUS

## 2013-03-02 MED ORDER — DEXAMETHASONE SODIUM PHOSPHATE 10 MG/ML IJ SOLN
6.0000 mg | Freq: Four times a day (QID) | INTRAMUSCULAR | Status: DC
  Administered 2013-03-02 – 2013-03-06 (×15): 6 mg via INTRAVENOUS
  Filled 2013-03-02 (×9): qty 0.6
  Filled 2013-03-02: qty 1
  Filled 2013-03-02 (×2): qty 0.6
  Filled 2013-03-02: qty 1
  Filled 2013-03-02 (×2): qty 0.6
  Filled 2013-03-02: qty 1
  Filled 2013-03-02 (×2): qty 0.6
  Filled 2013-03-02: qty 1
  Filled 2013-03-02: qty 0.6
  Filled 2013-03-02: qty 1

## 2013-03-02 MED ORDER — LIDOCAINE-EPINEPHRINE 0.5 %-1:200000 IJ SOLN
INTRAMUSCULAR | Status: DC | PRN
  Administered 2013-03-02: 10 mL

## 2013-03-02 MED ORDER — SODIUM CHLORIDE 0.9 % IV SOLN
500.0000 mg | Freq: Two times a day (BID) | INTRAVENOUS | Status: DC
  Filled 2013-03-02: qty 5

## 2013-03-02 MED ORDER — SENNA 8.6 MG PO TABS
1.0000 | ORAL_TABLET | Freq: Two times a day (BID) | ORAL | Status: DC
  Administered 2013-03-03 – 2013-03-07 (×6): 8.6 mg via ORAL
  Filled 2013-03-02 (×13): qty 1

## 2013-03-02 MED ORDER — SODIUM CHLORIDE 0.9 % IV SOLN: INTRAVENOUS | Status: DC

## 2013-03-02 MED ORDER — MORPHINE SULFATE 2 MG/ML IJ SOLN
1.0000 mg | INTRAMUSCULAR | Status: DC | PRN
  Administered 2013-03-02 – 2013-03-03 (×3): 1 mg via INTRAVENOUS
  Filled 2013-03-02 (×3): qty 1

## 2013-03-02 MED ORDER — SODIUM CHLORIDE 0.9 % IV SOLN
INTRAVENOUS | Status: DC | PRN
  Administered 2013-03-02: 16:00:00 via INTRAVENOUS

## 2013-03-02 MED ORDER — POTASSIUM CHLORIDE IN NACL 20-0.9 MEQ/L-% IV SOLN
INTRAVENOUS | Status: DC
  Filled 2013-03-02 (×2): qty 1000

## 2013-03-02 MED ORDER — LIDOCAINE HCL (CARDIAC) 20 MG/ML IV SOLN
INTRAVENOUS | Status: DC | PRN
  Administered 2013-03-02: 40 mg via INTRAVENOUS

## 2013-03-02 MED ORDER — GLUCERNA SHAKE PO LIQD: 237.0000 mL | Freq: Two times a day (BID) | ORAL | Status: DC

## 2013-03-02 MED ORDER — HYDROCODONE-ACETAMINOPHEN 5-325 MG PO TABS
1.0000 | ORAL_TABLET | ORAL | Status: DC | PRN
  Administered 2013-03-06: 1 via ORAL
  Filled 2013-03-02 (×3): qty 1

## 2013-03-02 MED ORDER — PROMETHAZINE HCL 25 MG PO TABS: 12.5000 mg | ORAL_TABLET | ORAL | Status: DC | PRN

## 2013-03-02 MED ORDER — NALOXONE HCL 0.4 MG/ML IJ SOLN
0.0800 mg | INTRAMUSCULAR | Status: DC | PRN
Start: 2013-03-02 — End: 2013-03-07

## 2013-03-02 MED ORDER — INSULIN REGULAR BOLUS VIA INFUSION
0.0000 [IU] | Freq: Three times a day (TID) | INTRAVENOUS | Status: DC
  Filled 2013-03-02: qty 10

## 2013-03-02 MED ORDER — VECURONIUM BROMIDE 10 MG IV SOLR
INTRAVENOUS | Status: DC | PRN
  Administered 2013-03-02 (×3): 2 mg via INTRAVENOUS

## 2013-03-02 MED ORDER — PROPOFOL 10 MG/ML IV BOLUS
INTRAVENOUS | Status: DC | PRN
  Administered 2013-03-02: 140 mg via INTRAVENOUS

## 2013-03-02 MED ORDER — ONDANSETRON HCL 4 MG PO TABS: 4.0000 mg | ORAL_TABLET | ORAL | Status: DC | PRN

## 2013-03-02 MED ORDER — HEMOSTATIC AGENTS (NO CHARGE) OPTIME
TOPICAL | Status: DC | PRN
  Administered 2013-03-02: 1 via TOPICAL

## 2013-03-02 SURGICAL SUPPLY — 128 items
APL SKNCLS STERI-STRIP NONHPOA (GAUZE/BANDAGES/DRESSINGS)
APL SRG 60D 8 XTD TIP BNDBL (TIP) ×1
BANDAGE GAUZE 4  KLING STR (GAUZE/BANDAGES/DRESSINGS) ×2 IMPLANT
BANDAGE GAUZE ELAST BULKY 4 IN (GAUZE/BANDAGES/DRESSINGS) ×2 IMPLANT
BENZOIN TINCTURE PRP APPL 2/3 (GAUZE/BANDAGES/DRESSINGS) IMPLANT
BLADE SAW GIGLI 16 STRL (MISCELLANEOUS) IMPLANT
BLADE SURG 15 STRL LF DISP TIS (BLADE) IMPLANT
BLADE SURG 15 STRL SS (BLADE)
BLADE SURG ROTATE 9660 (MISCELLANEOUS) ×3 IMPLANT
BLADE ULTRA TIP 2M (BLADE) ×1 IMPLANT
BRUSH SCRUB EZ 1% IODOPHOR (MISCELLANEOUS) ×1 IMPLANT
BUR ACORN 6.0 PRECISION (BURR) ×2 IMPLANT
BUR ACORN 6.0MM PRECISION (BURR) ×1
BUR ADDG 1.1 (BURR) IMPLANT
BUR ADDG 1.1MM (BURR)
BUR MATCHSTICK NEURO 3.0 LAGG (BURR) IMPLANT
BUR ROUTER D-58 CRANI (BURR) ×2 IMPLANT
CANISTER SUCT 3000ML (MISCELLANEOUS) ×3 IMPLANT
CATH VENTRIC 35X38 W/TROCAR LG (CATHETERS) IMPLANT
CLIP RANEY DISP (INSTRUMENTS) ×4 IMPLANT
CLIP TI MEDIUM 6 (CLIP) IMPLANT
CONT SPEC 4OZ CLIKSEAL STRL BL (MISCELLANEOUS) ×6 IMPLANT
CORDS BIPOLAR (ELECTRODE) ×3 IMPLANT
COVER MAYO STAND STRL (DRAPES) IMPLANT
DECANTER SPIKE VIAL GLASS SM (MISCELLANEOUS) ×3 IMPLANT
DRAIN SNY WOU 7FLT (WOUND CARE) IMPLANT
DRAIN SUBARACHNOID (WOUND CARE) IMPLANT
DRAPE CAMERA VIDEO/LASER (DRAPES) IMPLANT
DRAPE LONG LASER MIC (DRAPES) IMPLANT
DRAPE MICROSCOPE LEICA (MISCELLANEOUS) ×2 IMPLANT
DRAPE NEUROLOGICAL W/INCISE (DRAPES) ×3 IMPLANT
DRAPE ORTHO SPLIT 77X108 STRL (DRAPES)
DRAPE STERI IOBAN 125X83 (DRAPES) IMPLANT
DRAPE SURG 17X23 STRL (DRAPES) IMPLANT
DRAPE SURG IRRIG POUCH 19X23 (DRAPES) IMPLANT
DRAPE SURG ORHT 6 SPLT 77X108 (DRAPES) IMPLANT
DRAPE WARM FLUID 44X44 (DRAPE) ×3 IMPLANT
DRESSING TELFA 8X3 (GAUZE/BANDAGES/DRESSINGS) ×3 IMPLANT
DURAMATRIX ONLAY 3X3 (Plate) ×2 IMPLANT
DURAPREP 6ML APPLICATOR 50/CS (WOUND CARE) ×3 IMPLANT
DURASEAL APPLICATOR TIP (TIP) ×2 IMPLANT
DURASEAL SPINE SEALANT 3ML (MISCELLANEOUS) ×2 IMPLANT
ELECT CAUTERY BLADE 6.4 (BLADE) ×3 IMPLANT
ELECT REM PT RETURN 9FT ADLT (ELECTROSURGICAL) ×3
ELECTRODE REM PT RTRN 9FT ADLT (ELECTROSURGICAL) ×1 IMPLANT
EVACUATOR 1/8 PVC DRAIN (DRAIN) IMPLANT
EVACUATOR SILICONE 100CC (DRAIN) IMPLANT
FORCEPS BIPOLAR SPETZLER 8 1.0 (NEUROSURGERY SUPPLIES) ×2 IMPLANT
GAUZE SPONGE 4X4 16PLY XRAY LF (GAUZE/BANDAGES/DRESSINGS) IMPLANT
GLOVE BIO SURGEON STRL SZ 6.5 (GLOVE) IMPLANT
GLOVE BIO SURGEON STRL SZ7 (GLOVE) IMPLANT
GLOVE BIO SURGEON STRL SZ7.5 (GLOVE) IMPLANT
GLOVE BIO SURGEON STRL SZ8 (GLOVE) IMPLANT
GLOVE BIO SURGEON STRL SZ8.5 (GLOVE) IMPLANT
GLOVE BIO SURGEONS STRL SZ 6.5 (GLOVE)
GLOVE BIOGEL M 8.0 STRL (GLOVE) IMPLANT
GLOVE BIOGEL PI IND STRL 8 (GLOVE) IMPLANT
GLOVE BIOGEL PI INDICATOR 8 (GLOVE) ×4
GLOVE ECLIPSE 6.5 STRL STRAW (GLOVE) ×3 IMPLANT
GLOVE ECLIPSE 7.0 STRL STRAW (GLOVE) IMPLANT
GLOVE ECLIPSE 7.5 STRL STRAW (GLOVE) ×8 IMPLANT
GLOVE ECLIPSE 8.0 STRL XLNG CF (GLOVE) IMPLANT
GLOVE ECLIPSE 8.5 STRL (GLOVE) IMPLANT
GLOVE EXAM NITRILE LRG STRL (GLOVE) IMPLANT
GLOVE EXAM NITRILE MD LF STRL (GLOVE) IMPLANT
GLOVE EXAM NITRILE XL STR (GLOVE) IMPLANT
GLOVE EXAM NITRILE XS STR PU (GLOVE) IMPLANT
GLOVE INDICATOR 6.5 STRL GRN (GLOVE) IMPLANT
GLOVE INDICATOR 7.0 STRL GRN (GLOVE) IMPLANT
GLOVE INDICATOR 7.5 STRL GRN (GLOVE) IMPLANT
GLOVE INDICATOR 8.0 STRL GRN (GLOVE) IMPLANT
GLOVE INDICATOR 8.5 STRL (GLOVE) IMPLANT
GLOVE OPTIFIT SS 8.0 STRL (GLOVE) IMPLANT
GLOVE SURG SS PI 6.5 STRL IVOR (GLOVE) IMPLANT
GOWN BRE IMP SLV AUR LG STRL (GOWN DISPOSABLE) ×2 IMPLANT
GOWN BRE IMP SLV AUR XL STRL (GOWN DISPOSABLE) IMPLANT
GOWN STRL REIN 2XL LVL4 (GOWN DISPOSABLE) IMPLANT
GOWN STRL REUS W/ TWL LRG LVL4 (GOWN DISPOSABLE) IMPLANT
GOWN STRL REUS W/TWL LRG LVL4 (GOWN DISPOSABLE) ×6
GOWN STRL REUS W/TWL XL LVL4 (GOWN DISPOSABLE) ×2 IMPLANT
HEMOSTAT SURGICEL 2X14 (HEMOSTASIS) ×2 IMPLANT
HOOK DURA (MISCELLANEOUS) ×2 IMPLANT
KIT BASIN OR (CUSTOM PROCEDURE TRAY) ×3 IMPLANT
KIT DRAIN CSF ACCUDRAIN (MISCELLANEOUS) IMPLANT
KIT ROOM TURNOVER OR (KITS) ×3 IMPLANT
NDL HYPO 25X1 1.5 SAFETY (NEEDLE) ×1 IMPLANT
NDL SPNL 18GX3.5 QUINCKE PK (NEEDLE) IMPLANT
NEEDLE HYPO 25X1 1.5 SAFETY (NEEDLE) ×3 IMPLANT
NEEDLE SPNL 18GX3.5 QUINCKE PK (NEEDLE) IMPLANT
NS IRRIG 1000ML POUR BTL (IV SOLUTION) ×7 IMPLANT
PACK CRANIOTOMY (CUSTOM PROCEDURE TRAY) ×3 IMPLANT
PAD EYE OVAL STERILE LF (GAUZE/BANDAGES/DRESSINGS) IMPLANT
PATTIES SURGICAL .25X.25 (GAUZE/BANDAGES/DRESSINGS) IMPLANT
PATTIES SURGICAL .5 X.5 (GAUZE/BANDAGES/DRESSINGS) IMPLANT
PATTIES SURGICAL .5 X3 (DISPOSABLE) ×2 IMPLANT
PATTIES SURGICAL 1/4 X 3 (GAUZE/BANDAGES/DRESSINGS) IMPLANT
PATTIES SURGICAL 1X1 (DISPOSABLE) ×2 IMPLANT
PLATE 1.5  2HOLE LNG NEURO (Plate) ×2 IMPLANT
PLATE 1.5 2HOLE LNG NEURO (Plate) IMPLANT
PLATE 1.5/0.5 18.5MM BURR HOLE (Plate) ×6 IMPLANT
RUBBERBAND STERILE (MISCELLANEOUS) ×4 IMPLANT
SCREW SELF DRILL HT 1.5/4MM (Screw) ×30 IMPLANT
SET TUBING W/EXT DISP (INSTRUMENTS) ×2 IMPLANT
SPECIMEN JAR SMALL (MISCELLANEOUS) ×2 IMPLANT
SPONGE GAUZE 4X4 12PLY (GAUZE/BANDAGES/DRESSINGS) ×3 IMPLANT
SPONGE NEURO XRAY DETECT 1X3 (DISPOSABLE) ×4 IMPLANT
SPONGE SURGIFOAM ABS GEL 100 (HEMOSTASIS) ×7 IMPLANT
STAPLER VISISTAT 35W (STAPLE) ×5 IMPLANT
SUT ETHILON 3 0 FSL (SUTURE) IMPLANT
SUT ETHILON 3 0 PS 1 (SUTURE) IMPLANT
SUT NURALON 4 0 TR CR/8 (SUTURE) ×7 IMPLANT
SUT PL GUT 3 0 FS 1 (SUTURE) IMPLANT
SUT SILK 0 FSL (SUTURE) ×4 IMPLANT
SUT SILK 0 TIES 10X30 (SUTURE) IMPLANT
SUT STEEL 0 (SUTURE)
SUT STEEL 0 18XMFL TIE 17 (SUTURE) IMPLANT
SUT VIC AB 2-0 CT2 18 VCP726D (SUTURE) ×10 IMPLANT
SYR 20ML ECCENTRIC (SYRINGE) ×3 IMPLANT
SYR CONTROL 10ML LL (SYRINGE) ×3 IMPLANT
TIP SONASTAR STD MISONIX 1.9 (TRAY / TRAY PROCEDURE) IMPLANT
TIP STRAIGHT 25KHZ (INSTRUMENTS) ×2 IMPLANT
TOWEL OR 17X24 6PK STRL BLUE (TOWEL DISPOSABLE) ×3 IMPLANT
TOWEL OR 17X26 10 PK STRL BLUE (TOWEL DISPOSABLE) ×3 IMPLANT
TRAY FOLEY CATH 14FRSI W/METER (CATHETERS) ×3 IMPLANT
TUBE CONNECTING 12'X1/4 (SUCTIONS) ×1
TUBE CONNECTING 12X1/4 (SUCTIONS) ×2 IMPLANT
UNDERPAD 30X30 INCONTINENT (UNDERPADS AND DIAPERS) ×3 IMPLANT
WATER STERILE IRR 1000ML POUR (IV SOLUTION) ×3 IMPLANT

## 2013-03-02 NOTE — Op Note (Signed)
02/25/2013 - 03/02/2013  6:02 PM  PATIENT:  [name]  80 y.o. female with a large right frontal extraaxial tumor causing right to left shift, and early subfalcine herniation. She is taken to the operating room for tumor resection.   PRE-OPERATIVE DIAGNOSIS:  brain tumor  POST-OPERATIVE DIAGNOSIS:  brain tumor  PROCEDURE:  Procedure(s): Bifrontal CRANIOTOMY TUMOR EXCISION  SURGEON:  Surgeon(s): Winfield Cunas, MD Otilio Connors, MD  ASSISTANTS:Hirsch, Jeneen Rinks  ANESTHESIA:   general  EBL:  Total I/O In: 2825 [I.V.:2500; Blood:325] Out: 1430 [Urine:630; Blood:800]  BLOOD ADMINISTERED:none  CELL SAVER GIVEN:none  COUNT:per nursing  DRAINS: none   SPECIMEN:  Source of Specimen:  intracranial, intradural space  DICTATION: [name] was taken to the operating room, intubated and placed under a general anesthetic without difficulty. She was positioned supine with her head on a horseshoe headrest. Her head was shaved, prepped, and draped in a sterile manner. I infiltrated 10cc 1/2%lidocaine into the planned incision. I opened the scalp with a 10 blade, in a curvilinear fashion , essentially tragus to tragus. I did not violate the temporalis fascia on the right side, nor the left. I made the incision so that I was posterior to the coronal suture. I drilled a burr hole in the pterion, just on both sides of the SSsinus and anteriorly just to the left of the sinus. [name] had little in the way of a frontal sinus. Dr. Luiz Ochoa and I ensured we had dissected the sinus from the skull for free use of the craniotome. We used the craniotome and removed the bone flap without difficulty. Some bleeding from the sinus was easily controlled with gelfoam and cottonoid patties. We opened the dura and immediately encountered the tumor. The tumor was extraaxial and slightly adherent to the brain. We dissected tumor so that we could identify the falx. The anterior sinus was not patent as seen on a  preop MRV. I used a 1-0 silk and ligated the superior sagittal sinus, then divided it sharply anterior to the suture freeing the tumor posteriorly. We sharply divided the falx to its inferior margin, and now had both left and right hemispheres exposed. We worked slowly to remove the tumor in as atraumatic fashion as possible. We were able to remove the tumor in total gross fashion sweeping it off the crista galli and ethmoids anteriorly. We then achieved hemostasis and prepared to close.  I developed a pericranial flap and brought that down over the anterior skull base to cover the ethmoids . I used a dural substitute to cover the brain. We tacked the duramatrix to some of the remaining dura. I did leave the gelfoam in place over the posterior sinus. We replaced the bone flap using burr hole covers. We then closed the scalp in layers approximating the galea, then the scalp edges with staples. A sterile dressing was applied. She was extubated and breathing on her on.   PLAN OF CARE: Admit to inpatient   PATIENT DISPOSITION:  PACU - hemodynamically stable.   Delay start of Pharmacological VTE agent (>24hrs) due to surgical blood loss or risk of bleeding:  yes

## 2013-03-02 NOTE — Anesthesia Preprocedure Evaluation (Addendum)
Anesthesia Evaluation  Patient identified by MRN, date of birth, ID band Patient awake    Reviewed: Allergy & Precautions, H&P , NPO status , Patient's Chart, lab work & pertinent test results  Airway Mallampati: I      Dental   Pulmonary former smoker,  breath sounds clear to auscultation        Cardiovascular hypertension, +CHF Rhythm:Regular Rate:Normal     Neuro/Psych CVA    GI/Hepatic Neg liver ROS, GERD-  ,  Endo/Other  diabetes  Renal/GU Renal disease     Musculoskeletal   Abdominal   Peds  Hematology   Anesthesia Other Findings   Reproductive/Obstetrics                          Anesthesia Physical Anesthesia Plan  ASA: IV  Anesthesia Plan: General   Post-op Pain Management:    Induction: Intravenous  Airway Management Planned: Oral ETT  Additional Equipment: Arterial line and CVP  Intra-op Plan:   Post-operative Plan: Post-operative intubation/ventilation  Informed Consent: I have reviewed the patients History and Physical, chart, labs and discussed the procedure including the risks, benefits and alternatives for the proposed anesthesia with the patient or authorized representative who has indicated his/her understanding and acceptance.   Dental advisory given  Plan Discussed with: CRNA, Anesthesiologist and Surgeon  Anesthesia Plan Comments:        Anesthesia Quick Evaluation

## 2013-03-02 NOTE — Progress Notes (Signed)
Inpatient Diabetes Program Recommendations  AACE/ADA: New Consensus Statement on Inpatient Glycemic Control (2013)  Target Ranges:  Prepandial:   less than 140 mg/dL      Peak postprandial:   less than 180 mg/dL (1-2 hours)      Critically ill patients:  140 - 180 mg/dL     **Patient was started on IV insulin drip with the GlucoStabilizer around 3pm yesterday (01/14).  Insulin drip remained active throughout the night until ~8am this morning.  Last documented IV insulin drip rate was 5.2 units/hr at 8:21am.  **Called Neuro OR and attempted to speak with RN caring for [name] x2 attempts (RN busy caring for patient on both attempts).  Left message with person answering the phone to please have RN caring for [name] page the Inpatient DM Program at [phone] in regards to questions about the IV insulin drip.  Called RN Quillian Quince on 4N.  RN told me that the Neuro OR RN told him to stop the IV insulin drip before they transported [name] to the OR.  I asked Quillian Quince if he had an MD order for the D/C of the IV insulin drip, however, it was unclear if the RN had an order.  Did not see that the neurosurgeon placed an order to stop the IV insulin drip.  **Called Dr. Naaman Plummer to discuss the above issue.  Alerted Dr. Naaman Plummer to the fact that patient's IV insulin drip was stopped without the Internal Medicine Teaching team being aware of this.  Last documented CBG was 174 at 11:15 am.  Patient currently in [name].  **May want to reinitiate the IV insulin drip directly after surgery.  Unsure why insulin drip was stopped.  IV insulin is the safest insulin option available and is safe to use during surgery.    Will follow. Wyn Quaker RN, MSN, CDE Diabetes Coordinator Inpatient Diabetes Program Team Pager: [phone] (8a-10p)

## 2013-03-02 NOTE — Progress Notes (Signed)
Name: [name] MRN: [id number] DOB: [birth year]    ADMISSION DATE:  02/25/2013 CONSULTATION DATE:  03/02/13  REFERRING MD :  Graciella Freer PRIMARY SERVICE: PCCM  CHIEF COMPLAINT:  Seizures  BRIEF PATIENT DESCRIPTION: The patient is a 80 yo woman, history of meningiomas, presenting 1/10 with new-onset seizures, with CT showing edema and midline shift surrounding meningioma, s/p resection of meningioma 1/15.  SIGNIFICANT EVENTS / STUDIES:  1/10 - admitted with new-onset seizure, with turning head to left, generalized shaking, and "buzzing" sound.  CT showed edema and midline shift around R meningioma. 1/14 - started on insulin drip for hyperglycemia 1/15 - underwent craniotomy with tumor excision  LINES / TUBES: R IJ CVL 1/15 >>> Foley 1/15>>>  CULTURES: Urine culture 1/10>> 100K colonies, multiple bacterial morphotypes  ANTIBIOTICS: Cefazolin x2, 1/15 Ceftriaxone 1/10>>1/10  HISTORY OF PRESENT ILLNESS:   The patient is a 80 yo woman, history of meningioma, brittle DM, HTN, prior CVA, presenting 1/10 with new-onset seizure.  Seizures described as turning her head to the left, generalized shaking, and making a "buzzing" sound.  CT showed increase in size of R frontal meningioma, with edema and 8 mm midline shift.  The patient was started on dexamethasone and Keppra, and taken to the OR 1/15 for resection of meningioma.  Hospital course has been complicated by elevated blood sugars, requiring an insulin drip 1/14-1/15.  The patient is currently resting in bed, sedated but oriented x3, noting a mild headache, but no other complaints.  PAST MEDICAL HISTORY :  Past Medical History  Diagnosis Date  . Diabetes mellitus 2007    HgA1C (02/20/2010) = 9.2, HgA1C (03/20/2009) = 12.1  . Hyperlipidemia   . Hypertension   . GERD (gastroesophageal reflux disease)   . CVA (cerebrovascular accident) 6945     right embolic stroke, no residual deficits  . Diverticulitis   . CVA (cerebral  infarction) 7-yrs ago  . Arthritis   . Dysphagia   . CHF (congestive heart failure)   . DIABETES MELLITUS, TYPE II 12/04/2005  . HYPERLIPIDEMIA 12/04/2005  . HYPERTENSION 12/04/2005  . GERD 12/04/2005  . ARTHRITIS, KNEE 03/24/2006  . Lung mass 07/08/2010  . Meningioma 07/21/2010  . Hemangioma of liver 12/02/2010  . Depression 12/02/2010  . Adenocarcinoma of lung      Right upper lobe adenocarcinoma.    Past Surgical History  Procedure Laterality Date  . Abdominal hysterectomy    . Video bronchoscope.  12/29/2007    [place]  . Wide excision of left upper back mass.    . Extracapsular cataract extraction with intraocular      lens implantation.  . Right vats,right thoracotomy,right lower lobectomy with node dissection    . Incision and drainage perirectal abscess N/A 01/19/2013    Procedure: IRRIGATION AND DEBRIDEMENT PERIRECTAL ABSCESS;  Surgeon: Zenovia Jarred, MD;  Location: [place];  Service: General;  Laterality: N/A;   Prior to Admission medications   Medication Sig Start Date End Date Taking? Authorizing Provider  amLODipine (NORVASC) 10 MG tablet Take 1 tablet (10 mg total) by mouth daily. 01/27/13  Yes Michail Jewels, MD  aspirin 81 MG tablet Take 81 mg by mouth daily.    Yes Historical Provider, MD  carvedilol (COREG) 25 MG tablet Take 1 tablet (25 mg total) by mouth 2 (two) times daily with a meal. 01/03/13  Yes Jerene Pitch, MD  furosemide (LASIX) 20 MG tablet Take 1 tablet (20 mg total) by mouth daily. 02/03/13  Yes Dominic Pea,  DO  HYDROcodone-acetaminophen (NORCO/VICODIN) 5-325 MG per tablet Take 1 tablet by mouth 2 (two) times daily as needed for moderate pain. Do not fill before 04/16/2013 04/16/13 05/16/13 Yes Jerene Pitch, MD  Insulin Glargine (LANTUS SOLOSTAR) 100 UNIT/ML Solostar Pen Inject 15 Units into the skin at bedtime. 02/22/13  Yes Clinton Gallant, MD  Loratadine 10 MG CAPS Take 1 capsule (10 mg total) by mouth daily. 01/30/13  Yes Na Li, MD  pantoprazole (PROTONIX)  20 MG tablet Take 20 mg by mouth daily.   Yes Historical Provider, MD  simvastatin (ZOCOR) 20 MG tablet Take 20 mg by mouth daily.   Yes Historical Provider, MD  triamcinolone ointment (KENALOG) 0.1 % Apply 1 application topically 2 (two) times daily. 12/23/12  Yes Ejiroghene Emokpae, MD  albuterol (PROVENTIL HFA;VENTOLIN HFA) 108 (90 BASE) MCG/ACT inhaler Inhale 2 puffs into the lungs every 4 (four) hours as needed for wheezing. 01/03/13   Jerene Pitch, MD  albuterol (PROVENTIL) (2.5 MG/3ML) 0.083% nebulizer solution Take 3 mLs (2.5 mg total) by nebulization every 6 (six) hours as needed for wheezing. 11/03/11   Ansel Bong, MD  Blood Glucose Monitoring Suppl (ACCU-CHEK AVIVA PLUS) W/DEVICE KIT 1 each by Does not apply route 3 (three) times daily. dx code 250.00 insulin requiring 12/08/12   Jerene Pitch, MD  [employer] LANCETS MICRO THIN 33G MISC Check blood sugar 3 times a day before meals dx code 250.00 insulin requiring 08/16/12   Jerene Pitch, MD  glucose blood (ACCU-CHEK AVIVA PLUS) test strip Check blood sugar 3 times a day before meals dx code 250.00 insulin requiring 12/08/12   Jerene Pitch, MD  insulin aspart (NOVOLOG) 100 UNIT/ML injection Inject 1-5 Units into the skin 2 (two) times daily before lunch and supper. 02/22/13   Clinton Gallant, MD  Lancet Devices [facility]) lancets Check blood sugar 3 times a day before meals dx code 250.00 insulin requiring 12/08/12   Jerene Pitch, MD   No Known Allergies  FAMILY HISTORY:  Family History  Problem Relation Age of Onset  . Hyperlipidemia Brother   . Hypertension Brother   . Diabetes Brother    SOCIAL HISTORY:  reports that she quit smoking about 13 years ago. Her smoking use included Cigarettes. She smoked 0.00 packs per day. She has never used smokeless tobacco. She reports that she does not drink alcohol or use illicit drugs.  REVIEW OF SYSTEMS:   General: no fevers, chills, changes in weight, changes in appetite Skin: no  rash HEENT: +headache, no blurry vision, hearing changes, sore throat Pulm: no dyspnea, coughing, wheezing CV: no chest pain, palpitations, shortness of breath Abd: no abdominal pain, nausea/vomiting, diarrhea/constipation GU: no dysuria, hematuria, polyuria Ext: no arthralgias, myalgias Neuro: no weakness, numbness, or tingling  SUBJECTIVE:   VITAL SIGNS: Temp:  [97.5 F (36.4 C)-98.3 F (36.8 C)] 97.9 F (36.6 C) (01/15 1724) Pulse Rate:  [60-66] 61 (01/15 1816) Resp:  [8-67] 8 (01/15 1816) BP: (125-162)/(59-96) 139/63 mmHg (01/15 1739) SpO2:  [95 %-100 %] 95 % (01/15 1816) HEMODYNAMICS:   VENTILATOR SETTINGS:   INTAKE / OUTPUT: Intake/Output     01/14 0701 - 01/15 0700 01/15 0701 - 01/16 0700   P.O. 600    I.V. (mL/kg)  2565 (29.7)   Blood  325   Total Intake(mL/kg) 600 (6.9) 2890 (33.4)   Urine (mL/kg/hr)  630 (0.6)   Blood  800 (0.8)   Total Output   1430   Net +600 +1460  Urine Occurrence 2 x    Stool Occurrence 1 x      PHYSICAL EXAMINATION: General:  Lying in bed, drowsy, but arousable  Neuro:  A&O x3, CN II-XII intact, strength and sensation grossly intact HEENT:  Bandage in place on scalp, left pupil irregularly shaped (likely s/p iridotomy?), but bilateral pupils reactive to light Cardiovascular:  RRR, no m/g/r Lungs:  CTAB, no wheezes, rales, or ronchi Abdomen:  Soft, non-tender, non-distended, hypoactive bowel sounds Musculoskeletal:  No cyanosis, clubbing, or edema Skin:  Intact, no acute issues  LABS: PULMONARY  Recent Labs Lab 03/02/13 1606  PHART 7.412  PCO2ART 34.3*  PO2ART 237.0*  HCO3 22.0  TCO2 23  O2SAT 100.0    CBC  Recent Labs Lab 02/25/13 0212 02/27/13 0310 03/02/13 0333  03/02/13 1606 03/02/13 1619 03/02/13 1644  HGB 10.1* 10.0* 10.8*  < > 11.2* 10.9* 10.2*  HCT 32.7* 30.4* 34.6*  < > 33.0* 32.0* 30.0*  WBC 7.4 9.3 13.0*  --   --   --   --   PLT 305 322 315  --   --   --   --   < > = values in this interval  not displayed.  COAGULATION  Recent Labs Lab 03/02/13 0333  INR 1.09    CARDIAC   Recent Labs Lab 02/25/13 1015 02/25/13 1445 02/25/13 1958  TROPONINI <0.30 <0.30 <0.30   No results found for this basename: PROBNP,  in the last 168 hours   CHEMISTRY  Recent Labs Lab 02/25/13 0212 02/25/13 0213 02/26/13 0244 02/27/13 0310 03/01/13 0900 03/02/13 0333 03/02/13 1430 03/02/13 1512 03/02/13 1522 03/02/13 1606 03/02/13 1619 03/02/13 1644  NA 141  --  138 135* 137 139 136* 137 137 139 139 140  K 3.1*  --  3.6* 4.1 4.5 4.2 4.6 4.8 4.6 4.4 4.4 4.3  CL 101  --  98 97 99 100  --   --   --   --   --   --   CO2 29  --  _0 --   --   --   --   --   --   GLUCOSE 399*  --  350* 329* 414* 144* 288* 53* 294*  --  268* 257*  BUN 13  --  17 22 29* 28*  --   --   --   --   --   --   CREATININE 1.05  --  0.85 0.87 1.02 0.93  --   --   --   --   --   --   CALCIUM 8.4  --  8.9 8.5 8.5 8.4  --   --   --   --   --   --   MG  --  1.5 2.1  --   --   --   --   --   --   --   --   --    Estimated Creatinine Clearance: 53.9 ml/min (by C-G formula based on Cr of 0.93).   LIVER  Recent Labs Lab 02/24/13 1602 03/02/13 0333  AST 10  --   ALT 9  --   ALKPHOS 82  --   BILITOT <0.2*  --   PROT 7.5  --   ALBUMIN 3.5  --   INR  --  1.09     INFECTIOUS  Recent Labs Lab 02/25/13 0220  LATICACIDVEN 0.89     ENDOCRINE CBG (last 3)  Recent Labs  03/02/13 0912 03/02/13 1115 03/02/13 1729  GLUCAP 158* 174* 250*         IMAGING x48h  Dg Chest Port 1 View  03/02/2013   CLINICAL DATA:  Status post craniotomy.  Central line.  EXAM: PORTABLE CHEST - 1 VIEW  COMPARISON:  Single view of the chest 02/25/2013.  FINDINGS: A right IJ approach central venous catheter is in place with the tip projecting over the right atrium. There is no pneumothorax. Lung volumes are low with some basilar atelectasis. Heart size is mildly enlarged. No pleural effusion.  IMPRESSION: Right  IJ catheter tip projects over the right atrium. Recommend withdrawal of 4-5 cm. No pneumothorax.  Cardiomegaly without edema.   Electronically Signed   By: Inge Rise M.D.   On: 03/02/2013 18:06       CXR: No acute process, R IJ catheter tip in R atrium  ASSESSMENT / PLAN:  PULMONARY  A: No issues at this time-On O2 by [place] sat 100%.  P:  Monitor  -incentive spirometry  CARDIOVASCULAR  A: HTN  - Chronic Systolic heart failure - reported in history, though Echo from 2004 shows EF 55-65%  P:  - Blood pressure currently WNL, Hold BP meds for now.  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  P:  -check BMET, Mg, phos in AM  GASTROINTESTINAL  A: Nutrition P:  - Currently NPO, Follow neurosurg recs as to when to resume oral intake.  -continue protonix  HEMATOLOGIC  A: Microcytic/Normocytic anemia  - Post surgery  - Leukocytosis-Without focus of infection  P:  - CBC in the Am- F/u on WBC and Hgb.  -SCD's for DVT ppx for the 24 hours post-op  INFECTIOUS  A: Bacteuria in a diabetic.  P:  - Consider repeat urine culture, but if asymptomatic, will not be necessary.   ENDOCRINE  A: Diabetis Mellitus  - last A1C = 12.1, reportedly brittle diabetic P:  - Start Insulin drip + D5NS  -can likely transition off insulin drip in AM, if CBG's under control  NEUROLOGIC  A: Post bifrontal Craniotomy tumor excision- For meningioma  P:  - Management as per Neurosurg.  -continue decadron  TODAY'S SUMMARY: 12 y o F had bifrontal craniotomy for meningioma excision.  Elnora Morrison, PGY3 Pgr. [phone] 03/02/2013, 6:34 PM   STAFF NOTE: I, Dr Ann Lions have personally reviewed patient's available data, including medical history, events of note, physical examination and test results as part of my evaluation. I have discussed with resident/NP and other care providers such as pharmacist, RN and RRT.  In  addition,  I personally evaluated patient and elicited key findings of post op resp failure s/p extubation and resolved. Has anemia, CRI, bateriuria, DM, chronic systolic CHF all of which needs attention per NP plan above.  Rest per NP/medical resident whose note is outlined above and that I agree with    Dr. Brand Males, M.D., [facility].C.P Pulmonary and Critical Care Medicine Staff Physician [place] [facility] Pager: [phone], If no answer or between  15:00h - 7:00h: call [phone]  03/02/2013 10:57 PM

## 2013-03-02 NOTE — Anesthesia Procedure Notes (Signed)
Procedures RIJ CVP Dual Lumen 1100-1115: The patient was identified and consent obtained.  TO was performed, and full barrier precautions were used.  The skin was anesthetized with lidocaine.  Once the vein was located with the 22 ga. needle using ultrasound guidance , the wire was inserted into the vein.  The wire location was confirmed with ultrasound.  The tissue was dilated and the catheter was carefully inserted, then sutured in place. A dressing was applied. The patient tolerated the procedure well.

## 2013-03-02 NOTE — Progress Notes (Signed)
Subjective:  Pt seen and examined. No acute events overnight with no reported seizure activity. Pt NPO last night. She denies headache, vision change, weakness, paraesthesias, syncope,  fever, chills, dyspnea, chest pain, abdominal pain, nausea, vomiting, change in BM or urination. She is to have surgery this morning, family is present and supportive.     Objective: Vital signs in last 24 hours: Filed Vitals:   03/01/13 1859 03/01/13 2238 03/02/13 0213 03/02/13 0552  BP: 146/65 125/84 137/59 156/67  Pulse: 66 65 60   Temp: 98.3 F (36.8 C) 97.8 F (36.6 C) 98.1 F (36.7 C) 98 F (36.7 C)  TempSrc: Oral Oral Oral Oral  Resp: 20 14 18  67  Height:      Weight:      SpO2: 99% 99% 98% 100%   Weight change:   Intake/Output Summary (Last 24 hours) at 03/02/13 0839 Last data filed at 03/01/13 1320  Gross per 24 hour  Intake    600 ml  Output      0 ml  Net    600 ml   Constitutional: She is oriented to person, place, and time and well-developed, well-nourished, and in no distress. No distress.  Head: Normocephalic and atraumatic.  Eyes: Conjunctivae and EOM are normal.  Cardiovascular: Normal rate, regular rhythm, normal heart sounds. Exam reveals no gallop and no friction rub. No murmur heard.  Pulmonary/Chest: Effort normal and breath sounds normal. No respiratory distress. She has no wheezes. She has no rales. She exhibits no tenderness.  Abdominal: Soft. Bowel sounds are normal. She exhibits no distension. There is no abdominal tenderness. There is no rigidity, no rebound and no guarding.  Neurological: She is oriented to person, place, and time. She has normal sensation and normal strength. She displays facial symmetry. No cranial nerve deficit.   Extremities: no LE edema   Lab Results: Basic Metabolic Panel:  Recent Labs Lab 02/25/13 0213 02/26/13 0244  03/01/13 0900 03/02/13 0333  NA  --  138  < > 137 139  K  --  3.6*  < > 4.5 4.2  CL  --  98  < > 99 100    CO2  --  25  < > 23 28  GLUCOSE  --  350*  < > 414* 144*  BUN  --  17  < > 29* 28*  CREATININE  --  0.85  < > 1.02 0.93  CALCIUM  --  8.9  < > 8.5 8.4  MG 1.5 2.1  --   --   --   < > = values in this interval not displayed. Liver Function Tests:  Recent Labs Lab 02/24/13 1602  AST 10  ALT 9  ALKPHOS 82  BILITOT <0.2*  PROT 7.5  ALBUMIN 3.5   No results found for this basename: LIPASE, AMYLASE,  in the last 168 hours No results found for this basename: AMMONIA,  in the last 168 hours CBC:  Recent Labs Lab 02/27/13 0310 03/02/13 0333  WBC 9.3 13.0*  HGB 10.0* 10.8*  HCT 30.4* 34.6*  MCV 77.0* 78.1  PLT 322 315   Cardiac Enzymes:  Recent Labs Lab 02/25/13 0213 02/25/13 1015 02/25/13 1445 02/25/13 1958  CKTOTAL 34  --   --   --   TROPONINI  --  <0.30 <0.30 <0.30   BNP: No results found for this basename: PROBNP,  in the last 168 hours D-Dimer: No results found for this basename: DDIMER,  in  the last 168 hours CBG:  Recent Labs Lab 03/02/13 0214 03/02/13 0315 03/02/13 0453 03/02/13 0552 03/02/13 0655 03/02/13 0816  GLUCAP 122* 130* 163* 190* 204* 190*   Hemoglobin A1C: No results found for this basename: HGBA1C,  in the last 168 hours Fasting Lipid Panel: No results found for this basename: CHOL, HDL, LDLCALC, TRIG, CHOLHDL, LDLDIRECT,  in the last 168 hours Thyroid Function Tests: No results found for this basename: TSH, T4TOTAL, FREET4, T3FREE, THYROIDAB,  in the last 168 hours Coagulation:  Recent Labs Lab 03/02/13 0333  LABPROT 13.9  INR 1.09   Anemia Panel: No results found for this basename: VITAMINB12, FOLATE, FERRITIN, TIBC, IRON, RETICCTPCT,  in the last 168 hours Urine Drug Screen: Drugs of Abuse     Component Value Date/Time   LABOPIA NONE DETECTED 02/25/2013 0237   LABOPIA PPS 06/27/2012 1645   COCAINSCRNUR NONE DETECTED 02/25/2013 0237   COCAINSCRNUR NEG 06/27/2012 1645   LABBENZ POSITIVE* 02/25/2013 0237   LABBENZ PPS  06/27/2012 1645   LABBENZ NEG 07/02/2006 2021   AMPHETMU NONE DETECTED 02/25/2013 0237   AMPHETMU NEG 07/02/2006 2021   THCU NONE DETECTED 02/25/2013 0237   LABBARB NONE DETECTED 02/25/2013 0237   LABBARB NEG 06/27/2012 1645    Alcohol Level: No results found for this basename: ETH,  in the last 168 hours Urinalysis:  Recent Labs Lab 02/24/13 1835 02/25/13 0237  COLORURINE YELLOW YELLOW  LABSPEC 1.022 1.020  PHURINE 6.0 6.5  GLUCOSEU >1000* >1000*  HGBUR NEGATIVE TRACE*  BILIRUBINUR NEGATIVE NEGATIVE  KETONESUR NEGATIVE NEGATIVE  PROTEINUR NEGATIVE NEGATIVE  UROBILINOGEN 0.2 0.2  NITRITE NEGATIVE NEGATIVE  LEUKOCYTESUR MODERATE* SMALL*    Micro Results: Recent Results (from the past 240 hour(s))  URINE CULTURE     Status: None   Collection Time    02/25/13  2:37 AM      Result Value Range Status   Specimen Description URINE, RANDOM   Final   Special Requests NONE   Final   Culture  Setup Time     Final   Value: 02/25/2013 10:35     Performed at [facility]     Final   Value: >=100,000 COLONIES/ML     Performed at [facility]   Culture     Final   Value: Multiple bacterial morphotypes present, none predominant. Suggest appropriate recollection if clinically indicated.     Performed at [facility]   Report Status 02/26/2013 FINAL   Final  MRSA PCR SCREENING     Status: None   Collection Time    02/25/13  6:38 AM      Result Value Range Status   MRSA by PCR NEGATIVE  NEGATIVE Final   Comment:            The GeneXpert MRSA Assay (FDA     approved for NASAL specimens     only), is one component of a     comprehensive MRSA colonization     surveillance program. It is not     intended to diagnose MRSA     infection nor to guide or     monitor treatment for     MRSA infections.   Studies/Results: No results found. Medications: I have reviewed the patient's current medications. Scheduled Meds: . amLODipine  10 mg Oral Daily   . carvedilol  25 mg Oral BID WC  . dexamethasone  4 mg Oral Q8H  . furosemide  20 mg  Oral Daily  . heparin  5,000 Units Subcutaneous Q8H  . insulin regular  0-10 Units Intravenous TID WC  . levETIRAcetam  500 mg Oral BID  . lisinopril  5 mg Oral Daily  . multivitamin with minerals  1 tablet Oral Daily  . sodium chloride  3 mL Intravenous Q12H   Continuous Infusions: . sodium chloride Stopped (03/02/13 0101)  . dextrose 5 % and 0.45% NaCl 75 mL/hr at 03/02/13 0101  . insulin (NOVOLIN-R) infusion     PRN Meds:.acetaminophen, acetaminophen, dextrose, feeding supplement (GLUCERNA SHAKE), hydrALAZINE Assessment/Plan:  Assessment: 80 year old woman who presented on 1/10 with new-onset seizures and found to have exapaneded right frontal meningioma with considerable mass effect and direct invasion of the brain.    New-onset seizures due to meningioma complicated by mass effect (right to left shift 12 mm) with vasogenic edema, direct invasion of the right frontal lobe, and no anterior 1/3 superior sagittal venous flow  - no seizure activity since 1/10 -Appreciate neurosurgery recommendations  -Dr. Christella Noa to perform bifrontal craniotomy for removal of meningioma this AM  -Seizure precautions -Frequent neurochecks -Continue Day 6 PO Keppra 500 mg BID, will need to continue after   -Continue Day 6 PO Decadron 4 mg TID    -Lacy Duverney family spokesperson [phone] or [phone]  Uncontrolled Type I Diabetes Mellitus - CBG 190 Last HbA1c of 12.1 on 12/23/12. Pt at home on 15 U Lantus and 1-5 U BID before lunch and dinner.  -Continue insulin drip -Yesterday required 27 U ISS -Appreciate diabetes coordinator recommendations --> continue insulin gtt per GlucoStabilizer during surgery and after surgery  -Carb-modified diet   Hypertension -  Improving. Currently hypertensive. Per neurosurgery to treat hypertension. -Continue home furosemide 20 mg daily  -Continue home amlodipine 10 mg  daily -Continue home carvedilol 25 mg BID  -Continue lisinopril 5 mg daily  -Hydralazine 5 mg PRN SBP>180/100  Microcytic/Normocytic Anemia - currently stable without active bleeding. Pt with last Hg 10.1 with baseline 10-12. Etiology unknown.  -Continue to monitor CBC --> stable  -Continue to monitor for bleeding -Consider anemia panel -Obtain PT/PTT --> wnl  Hyperlipidemia - Last lipid panel 10/2010 with hypercholesteremia (LDL 147 total 213)  -Continue home simvastatin 20 mg daily   GERD - currently without reflux symptoms. Pt on 20 mg protonix at home. -Continue protonix 40 mg daily  Hypokalemia - resolved.  Most likely due to poor PO intake.  -Replete with PO potassium chloride as needed -Continue to monitor BMP   Diet: Carb modified DVT Ppx: SQ Heparin TID Code: Full   Dispo: Disposition is deferred at this time, awaiting improvement of current medical problems.  Anticipated discharge in approximately 3-5 day(s).   The patient does have a current PCP Jerene Pitch, MD) and does need an [facility] hospital follow-up appointment after discharge.  The patient does have transportation limitations that hinder transportation to clinic appointments.  .Services Needed at time of discharge: Y = Yes, Blank = No PT:   OT:   RN:   Equipment:   Other:     LOS: 5 days   Juluis Mire, MD 03/02/2013, 8:39 AM

## 2013-03-02 NOTE — Preoperative (Signed)
Beta Blockers   Reason not to administer Beta Blockers:Not Applicable 

## 2013-03-02 NOTE — Anesthesia Postprocedure Evaluation (Signed)
  Anesthesia Post-op Note  Patient: [name]  Procedure(s) Performed: Procedure(s) with comments: CRANIOTOMY TUMOR EXCISION (N/A) - Bifrontal Craniotomy for tumor  Patient Location: PACU  Anesthesia Type:General  Level of Consciousness: awake  Airway and Oxygen Therapy: Patient Spontanous Breathing  Post-op Pain: mild  Post-op Assessment: Post-op Vital signs reviewed  Post-op Vital Signs: Reviewed  Complications: No apparent anesthesia complications

## 2013-03-02 NOTE — Progress Notes (Signed)
  Date: 03/02/2013  Patient name: [name]  Medical record number: [id number]  Date of birth: Jul 04, 1933   This patient has been seen and the plan of care was discussed with the house staff. Please see their note for complete details. I concur with their findings with the following additions/corrections:  I saw Ms. Correia prior to surgery today. No acute changes in care.  CBG better controlled on insulin gtt.   Sid Falcon, MD 03/02/2013, 4:28 PM

## 2013-03-02 NOTE — Progress Notes (Signed)
Chaplain observed young man in hallway by ED, pacing and appearing upset and offered support. He said he is the pt's grandmother and that she was in surgery for a brain tumor. Chaplain observed that he was crying, affirmed the pt's fear, anxiety, and concern for his grandmother. Chaplain provided emotional support, empathic listening, and caring presence. Pt went back upstairs and thanked chaplain for talking. Please page if needed.   Ethelene Browns [phone]

## 2013-03-02 NOTE — Progress Notes (Signed)
NUTRITION FOLLOW UP  Intervention:   Provide Glucerna Shakes BID after meals Continue Multivitamin with minerals daily RD to continue to monitor  Nutrition Dx:   Predicted suboptimal energy intake related to weight loss history and schedule surgery as evidenced by pt's chart; ongoing, continued weight loss  Goal:   Pt to meet >/= 90% of their estimated nutrition needs; likely being met  Monitor:   PO intake; 75-100% meal completion per nursing notes Weight trends; trending down, additional 3 lb wt loss since admission Labs; blood glucose ranging 122 to 226 mg/dL, low hemoglobin, elevated BUN, decreased GFR  Assessment:   80yo woman with PMH of DM2, HLD, HTN, GERD, CVA, Meningioma who presents to the ED with new onset of seizures. CT scan of her head revealed an increased size of right frontal meningioma, vasogenic edema and right to left shift.   Pt our of room for surgery. Per MD note, pt to undergo bifrontal craniotomy for removal of meningioma. Pt reported good appetite to MD on 1/14 while pt was eating breakfast. Pt's weight continues to trend down- will change Glucerna Shake order from PRN to scheduled.   Height: Ht Readings from Last 1 Encounters:  02/28/13 _0  (1.626 m)    Weight Status:   Wt Readings from Last 1 Encounters:  02/28/13 190 lb 7.6 oz (86.4 kg)    Re-estimated needs:  Kcal: 1600-1850  Protein: 90-100 grams  Fluid: > 1.8 L/day  Skin: intact  Diet Order: NPO   Intake/Output Summary (Last 24 hours) at 03/02/13 1126 Last data filed at 03/01/13 1320  Gross per 24 hour  Intake    240 ml  Output      0 ml  Net    240 ml    Last BM: 1/13   Labs:   Recent Labs Lab 02/25/13 0213 02/26/13 0244 02/27/13 0310 03/01/13 0900 03/02/13 0333  NA  --  138 135* 137 139  K  --  3.6* 4.1 4.5 4.2  CL  --  98 97 99 100  CO2  --  _1 BUN  --  17 22 29* 28*  CREATININE  --  0.85 0.87 1.02 0.93  CALCIUM  --  8.9 8.5 8.5 8.4  MG 1.5 2.1  --    --   --   GLUCOSE  --  350* 329* 414* 144*    CBG (last 3)   Recent Labs  03/02/13 0552 03/02/13 0655 03/02/13 0816  GLUCAP 190* 204* 190*    Scheduled Meds: . amLODipine  10 mg Oral Daily  . carvedilol  25 mg Oral BID WC  . dexamethasone  4 mg Oral Q8H  . furosemide  20 mg Oral Daily  . heparin  5,000 Units Subcutaneous Q8H  . insulin regular  0-10 Units Intravenous TID WC  . levETIRAcetam  500 mg Oral BID  . lisinopril  5 mg Oral Daily  . multivitamin with minerals  1 tablet Oral Daily  . sodium chloride  3 mL Intravenous Q12H    Continuous Infusions: . sodium chloride Stopped (03/02/13 0101)  . dextrose 5 % and 0.45% NaCl 75 mL/hr at 03/02/13 0101  . insulin (NOVOLIN-R) infusion      Pryor Ochoa RD, LDN Inpatient Clinical Dietitian Pager: [phone] After Hours Pager: [phone]

## 2013-03-02 NOTE — Transfer of Care (Signed)
Immediate Anesthesia Transfer of Care Note  Patient: [name]  Procedure(s) Performed: Procedure(s) with comments: CRANIOTOMY TUMOR EXCISION (N/A) - Bifrontal Craniotomy for tumor  Patient Location: PACU  Anesthesia Type:General  Level of Consciousness: awake, patient cooperative and lethargic  Airway & Oxygen Therapy: Patient Spontanous Breathing and Patient connected to nasal cannula oxygen  Post-op Assessment: Report given to PACU RN, Post -op Vital signs reviewed and stable and Patient moving all extremities  Post vital signs: Reviewed and stable  Complications: No apparent anesthesia complications

## 2013-03-02 NOTE — Progress Notes (Signed)
Warnell Bureau was stopped by family member in surgical waiting area outside of [facility]. She stated that her mother is in surgery for a brain tumor and asked if chaplain could check on her. Chaplain explained how the monitor let her track what stage of the surgery her mother is in. Family member Levada Dy expressed thanks.

## 2013-03-03 DIAGNOSIS — E119 Type 2 diabetes mellitus without complications: Secondary | ICD-10-CM

## 2013-03-03 DIAGNOSIS — R7309 Other abnormal glucose: Secondary | ICD-10-CM

## 2013-03-03 LAB — CBC WITH DIFFERENTIAL/PLATELET
Basophils Absolute: 0 10*3/uL (ref 0.0–0.1)
Basophils Relative: 0 % (ref 0–1)
Eosinophils Absolute: 0 10*3/uL (ref 0.0–0.7)
Eosinophils Relative: 0 % (ref 0–5)
HEMATOCRIT: 33.7 % — AB (ref 36.0–46.0)
HEMOGLOBIN: 11 g/dL — AB (ref 12.0–15.0)
Lymphocytes Relative: 14 % (ref 12–46)
Lymphs Abs: 3.4 10*3/uL (ref 0.7–4.0)
MCH: 25.6 pg — ABNORMAL LOW (ref 26.0–34.0)
MCHC: 32.6 g/dL (ref 30.0–36.0)
MCV: 78.6 fL (ref 78.0–100.0)
MONO ABS: 1.5 10*3/uL — AB (ref 0.1–1.0)
MONOS PCT: 6 % (ref 3–12)
NEUTROS ABS: 18.8 10*3/uL — AB (ref 1.7–7.7)
Neutrophils Relative %: 80 % — ABNORMAL HIGH (ref 43–77)
Platelets: 237 10*3/uL (ref 150–400)
RBC: 4.29 MIL/uL (ref 3.87–5.11)
RDW: 15.9 % — ABNORMAL HIGH (ref 11.5–15.5)
WBC: 23.7 10*3/uL — AB (ref 4.0–10.5)

## 2013-03-03 LAB — CBC
HCT: 32.7 % — ABNORMAL LOW (ref 36.0–46.0)
Hemoglobin: 10.5 g/dL — ABNORMAL LOW (ref 12.0–15.0)
MCH: 25.2 pg — ABNORMAL LOW (ref 26.0–34.0)
MCHC: 32.1 g/dL (ref 30.0–36.0)
MCV: 78.4 fL (ref 78.0–100.0)
Platelets: 230 10*3/uL (ref 150–400)
RBC: 4.17 MIL/uL (ref 3.87–5.11)
RDW: 16 % — ABNORMAL HIGH (ref 11.5–15.5)
WBC: 23.9 10*3/uL — AB (ref 4.0–10.5)

## 2013-03-03 LAB — BASIC METABOLIC PANEL
BUN: 22 mg/dL (ref 6–23)
CALCIUM: 7.4 mg/dL — AB (ref 8.4–10.5)
CO2: 24 mEq/L (ref 19–32)
Chloride: 107 mEq/L (ref 96–112)
Creatinine, Ser: 0.86 mg/dL (ref 0.50–1.10)
GFR calc Af Amer: 74 mL/min — ABNORMAL LOW (ref >90)
GFR, EST NON AFRICAN AMERICAN: 64 mL/min — AB (ref >90)
GLUCOSE: 123 mg/dL — AB (ref 70–99)
Potassium: 4.1 mEq/L (ref 3.7–5.3)
SODIUM: 142 meq/L (ref 137–147)

## 2013-03-03 LAB — GLUCOSE, CAPILLARY
GLUCOSE-CAPILLARY: 109 mg/dL — AB (ref 70–99)
GLUCOSE-CAPILLARY: 125 mg/dL — AB (ref 70–99)
GLUCOSE-CAPILLARY: 131 mg/dL — AB (ref 70–99)
GLUCOSE-CAPILLARY: 152 mg/dL — AB (ref 70–99)
GLUCOSE-CAPILLARY: 156 mg/dL — AB (ref 70–99)
GLUCOSE-CAPILLARY: 224 mg/dL — AB (ref 70–99)
Glucose-Capillary: 113 mg/dL — ABNORMAL HIGH (ref 70–99)
Glucose-Capillary: 118 mg/dL — ABNORMAL HIGH (ref 70–99)
Glucose-Capillary: 139 mg/dL — ABNORMAL HIGH (ref 70–99)
Glucose-Capillary: 150 mg/dL — ABNORMAL HIGH (ref 70–99)
Glucose-Capillary: 182 mg/dL — ABNORMAL HIGH (ref 70–99)
Glucose-Capillary: 184 mg/dL — ABNORMAL HIGH (ref 70–99)
Glucose-Capillary: 216 mg/dL — ABNORMAL HIGH (ref 70–99)
Glucose-Capillary: 219 mg/dL — ABNORMAL HIGH (ref 70–99)
Glucose-Capillary: 229 mg/dL — ABNORMAL HIGH (ref 70–99)
Glucose-Capillary: 247 mg/dL — ABNORMAL HIGH (ref 70–99)
Glucose-Capillary: 97 mg/dL (ref 70–99)

## 2013-03-03 LAB — MAGNESIUM: Magnesium: 1.7 mg/dL (ref 1.5–2.5)

## 2013-03-03 LAB — PHOSPHORUS: Phosphorus: 3.6 mg/dL (ref 2.3–4.6)

## 2013-03-03 MED ORDER — INSULIN ASPART 100 UNIT/ML ~~LOC~~ SOLN
0.0000 [IU] | Freq: Every day | SUBCUTANEOUS | Status: DC
  Administered 2013-03-04: 5 [IU] via SUBCUTANEOUS

## 2013-03-03 MED ORDER — INSULIN GLARGINE 100 UNIT/ML ~~LOC~~ SOLN
20.0000 [IU] | Freq: Every day | SUBCUTANEOUS | Status: DC
  Administered 2013-03-04 – 2013-03-05 (×2): 20 [IU] via SUBCUTANEOUS
  Filled 2013-03-03 (×2): qty 0.2

## 2013-03-03 MED ORDER — INSULIN GLARGINE 100 UNIT/ML ~~LOC~~ SOLN
10.0000 [IU] | Freq: Once | SUBCUTANEOUS | Status: AC
  Administered 2013-03-03: 10 [IU] via SUBCUTANEOUS
  Filled 2013-03-03: qty 0.1

## 2013-03-03 MED ORDER — FUROSEMIDE 20 MG PO TABS
20.0000 mg | ORAL_TABLET | Freq: Every day | ORAL | Status: DC
  Administered 2013-03-03 – 2013-03-07 (×5): 20 mg via ORAL
  Filled 2013-03-03 (×5): qty 1

## 2013-03-03 MED ORDER — PANTOPRAZOLE SODIUM 40 MG PO TBEC
40.0000 mg | DELAYED_RELEASE_TABLET | Freq: Every day | ORAL | Status: DC
  Administered 2013-03-03 – 2013-03-06 (×4): 40 mg via ORAL
  Filled 2013-03-03 (×5): qty 1

## 2013-03-03 MED ORDER — CARVEDILOL 25 MG PO TABS
25.0000 mg | ORAL_TABLET | Freq: Two times a day (BID) | ORAL | Status: DC
  Administered 2013-03-03 – 2013-03-07 (×8): 25 mg via ORAL
  Filled 2013-03-03 (×11): qty 1

## 2013-03-03 MED ORDER — LORAZEPAM 2 MG/ML IJ SOLN
INTRAMUSCULAR | Status: AC
  Administered 2013-03-03: 12:00:00 2 mg via INTRAVENOUS
  Filled 2013-03-03: qty 1

## 2013-03-03 MED ORDER — DEXTROSE 10 % IV SOLN: INTRAVENOUS | Status: DC | PRN

## 2013-03-03 MED ORDER — AMLODIPINE BESYLATE 10 MG PO TABS
10.0000 mg | ORAL_TABLET | Freq: Every day | ORAL | Status: DC
  Administered 2013-03-03 – 2013-03-07 (×5): 10 mg via ORAL
  Filled 2013-03-03 (×5): qty 1

## 2013-03-03 MED ORDER — INSULIN GLARGINE 100 UNIT/ML ~~LOC~~ SOLN
20.0000 [IU] | SUBCUTANEOUS | Status: DC
  Filled 2013-03-03: qty 0.2

## 2013-03-03 MED ORDER — LORAZEPAM 2 MG/ML IJ SOLN
2.0000 mg | Freq: Once | INTRAMUSCULAR | Status: AC
  Administered 2013-03-03: 2 mg via INTRAVENOUS

## 2013-03-03 MED ORDER — INSULIN ASPART 100 UNIT/ML ~~LOC~~ SOLN
0.0000 [IU] | Freq: Three times a day (TID) | SUBCUTANEOUS | Status: DC
  Administered 2013-03-03 (×2): 2 [IU] via SUBCUTANEOUS
  Administered 2013-03-04: 5 [IU] via SUBCUTANEOUS
  Administered 2013-03-04: 8 [IU] via SUBCUTANEOUS
  Administered 2013-03-04 – 2013-03-05 (×2): 5 [IU] via SUBCUTANEOUS

## 2013-03-03 MED ORDER — SIMVASTATIN 20 MG PO TABS
20.0000 mg | ORAL_TABLET | Freq: Every day | ORAL | Status: DC
  Administered 2013-03-03 – 2013-03-06 (×4): 20 mg via ORAL
  Filled 2013-03-03 (×5): qty 1

## 2013-03-03 MED ORDER — INSULIN ASPART 100 UNIT/ML ~~LOC~~ SOLN: 2.0000 [IU] | SUBCUTANEOUS | Status: DC

## 2013-03-03 MED ORDER — SODIUM CHLORIDE 0.9 % IV SOLN
INTRAVENOUS | Status: DC
  Administered 2013-03-03 – 2013-03-05 (×3): via INTRAVENOUS

## 2013-03-03 MED ORDER — INSULIN GLARGINE 100 UNIT/ML ~~LOC~~ SOLN
10.0000 [IU] | SUBCUTANEOUS | Status: DC
  Administered 2013-03-03: 10 [IU] via SUBCUTANEOUS
  Filled 2013-03-03: qty 0.1

## 2013-03-03 NOTE — Progress Notes (Signed)
[facility] P[facility]   Name: [name] MRN: [id number] DOB: [birth year]    ADMISSION DATE:  02/25/2013 CONSULTATION DATE:  03/02/13  REFERRING MD :  Graciella Freer PRIMARY SERVICE: PCCM  CHIEF COMPLAINT:  Seizures  BRIEF PATIENT DESCRIPTION: The patient is a 80 yo woman, history of meningiomas, presenting 1/10 with new-onset seizures, with CT showing edema and midline shift surrounding meningioma, s/p resection of meningioma 1/15.  SIGNIFICANT EVENTS / STUDIES:  1/10 - admitted with new-onset seizure, with turning head to left, generalized shaking, and "buzzing" sound.  CT showed edema and midline shift around R meningioma. 1/14 - started on insulin drip for hyperglycemia 1/15 - underwent craniotomy with tumor excision  LINES / TUBES: R IJ CVL 1/15 >>> Foley 1/15>>>  CULTURES: Urine culture 1/10>> 100K colonies, multiple bacterial morphotypes  ANTIBIOTICS: Cefazolin x2, 1/15 Ceftriaxone 1/10>>1/10   SUBJECTIVE: Feels well this morning, no seizures or headache, still on insulin drip  VITAL SIGNS: Temp:  [97.5 F (36.4 C)-97.9 F (36.6 C)] 97.9 F (36.6 C) (01/16 0800) Pulse Rate:  [61-70] 70 (01/16 0800) Resp:  [8-33] 17 (01/16 0800) BP: (127-167)/(62-96) 161/71 mmHg (01/16 0800) SpO2:  [95 %-100 %] 100 % (01/16 0800) HEMODYNAMICS:   VENTILATOR SETTINGS:   INTAKE / OUTPUT: Intake/Output     01/15 0701 - 01/16 0700 01/16 0701 - 01/17 0700   P.O.     I.V. (mL/kg) 3367.4 (39) 77.9 (0.9)   Blood 325    IV Piggyback 155 105   Total Intake(mL/kg) 3847.4 (44.5) 182.9 (2.1)   Urine (mL/kg/hr) 1520 (0.7) 75 (0.3)   Blood 800 (0.4)    Total Output 2320 75   Net +1527.4 +107.9        Emesis Occurrence 1 x      PHYSICAL EXAMINATION: General:  Drowsy but arouses HEENT: Scalp dressing in place PULM: CTA B CV: RRR, no mgr AB: BS+, soft, nontender Ext: warm, trace edema Neuro: Somnolent but arouses and conversant  LABS: PULMONARY  Recent Labs Lab 03/02/13 1606  PHART 7.412   PCO2ART 34.3*  PO2ART 237.0*  HCO3 22.0  TCO2 23  O2SAT 100.0    CBC  Recent Labs Lab 03/02/13 0333  03/02/13 1644 03/03/13 0100 03/03/13 0430  HGB 10.8*  < > 10.2* 11.0* 10.5*  HCT 34.6*  < > 30.0* 33.7* 32.7*  WBC 13.0*  --   --  23.7* 23.9*  PLT 315  --   --  237 230  < > = values in this interval not displayed.  COAGULATION  Recent Labs Lab 03/02/13 0333  INR 1.09    CARDIAC    Recent Labs Lab 02/25/13 1015 02/25/13 1445 02/25/13 1958  TROPONINI <0.30 <0.30 <0.30   No results found for this basename: PROBNP,  in the last 168 hours   CHEMISTRY  Recent Labs Lab 02/25/13 0213 02/26/13 0244 02/27/13 0310 03/01/13 0900 03/02/13 0333  03/02/13 1512 03/02/13 1522 03/02/13 1606 03/02/13 1619 03/02/13 1644 03/03/13 0430  NA  --  138 135* 137 139  < > 137 137 139 139 140 142  K  --  3.6* 4.1 4.5 4.2  < > 4.8 4.6 4.4 4.4 4.3 4.1  CL  --  98 97 99 100  --   --   --   --   --   --  107  CO2  --  25 24 23 28   --   --   --   --   --   --  24  GLUCOSE  --  350* 329* 414* 144*  < > 53* 294*  --  268* 257* 123*  BUN  --  17 22 29* 28*  --   --   --   --   --   --  22  CREATININE  --  0.85 0.87 1.02 0.93  --   --   --   --   --   --  0.86  CALCIUM  --  8.9 8.5 8.5 8.4  --   --   --   --   --   --  7.4*  MG 1.5 2.1  --   --   --   --   --   --   --   --   --  1.7  PHOS  --   --   --   --   --   --   --   --   --   --   --  3.6  < > = values in this interval not displayed. Estimated Creatinine Clearance: 58.3 ml/min (by C-G formula based on Cr of 0.86).   LIVER  Recent Labs Lab 02/24/13 1602 03/02/13 0333  AST 10  --   ALT 9  --   ALKPHOS 82  --   BILITOT <0.2*  --   PROT 7.5  --   ALBUMIN 3.5  --   INR  --  1.09     INFECTIOUS  Recent Labs Lab 02/25/13 0220  LATICACIDVEN 0.89     ENDOCRINE CBG (last 3)   Recent Labs  03/03/13 0633 03/03/13 0738 03/03/13 0932  GLUCAP 97 113* 109*      CXR: No acute process, R IJ catheter  tip in R atrium  ASSESSMENT / PLAN:  PULMONARY  A: No acute issues P:  Monitor  -incentive spirometry  CARDIOVASCULAR  A: HTN  CHF, preserved EF, euvolemic P:  -restart home amolodipine, coreg, lasix  RENAL  A: CKD at baseline P:  -daily BMET  GASTROINTESTINAL  A: GERD P:  - advance diet - ppi  HEMATOLOGIC  A: Microcytic/Normocytic anemia, stable Leukocytosis-likely due to decadron P:  - CBC daily -SCD's for DVT ppx   INFECTIOUS  A: Bacteuria in a diabetic on 1/10 culture P:  - monitor for symptoms - cefazolin post op per NSGY  ENDOCRINE  A: Diabetis Mellitus  - last A1C = 12.1, reportedly brittle diabetic P:  - Carb modified diet - transition to sub cutaneous insulin regular and glargine  NEUROLOGIC  A: Post bifrontal Craniotomy tumor excision- For meningioma  P:  - Management as per Neurosurgery -continue decadron per NSGY  Dispo: back to IMTS, floor, PCCM off  Jillyn Hidden PCCM Pager: [phone] Cell: [phone] If no response, call [phone]

## 2013-03-03 NOTE — Progress Notes (Signed)
TRANSFER NOTE:  Brief overview of hospital course: [name] is a 80yo AAF with PMH DM, HLD, HTN, GERD, CVA (2006), CHF, CKD, adenocarcinoma of lung, R frontal parasagittal meningiommeningioma (2012- size roughly 2 x 3 x 4 cm), presenting 1/10 with new-onset seizures. Non contrast head CT showed enlarged right frontal extraaxial tumor causing right to left shift (47mm), and early subfalcine herniation. She was loaded with keppra in ED and given 10mg  decadron and since this time has been managed on keppra 500mg  IV bid, decadron 4mg  IV qid. Neurosurgery was promptly consulted and she is now s/p bifrontal craniotomy and resection of meningioma on 1/15. Patient now on decadron 6mg  qid, cefazolin 2g q8h (per neurosurgery) and remains on keppra 500mg  BID. Patient was extubated without problem and still has R IJ central line as well as foley in place now. Hb remains at baseline.  Pt's CBGs have been difficult to control and she was ultimately started on insulin drip for hyperglycemia control prior to surgery. Insulin gtt was d/c'd on 1/16 and she is now on lantus 20U qHS and SSI.  Of note, UCx showed >100,000 colonies, multiple bacterial morphotypes (though specimen was contaminated per UA). She is asymptomatic at this time, but is on cefazolin postop per neurosurgery anyway. Leukocytosis present  though to be due to decadron (WBC count began increasing on 1/12 after decadron started).   Subjective: Patient was somnolent and had received morphine 2mg  IV a few minutes before our interview. Per nurse, patient had a small focal R sided seizure at 1200, which resolved with ativan 2mg .   Objective: Vital signs in last 24 hours: Filed Vitals:   03/03/13 0800 03/03/13 0900 03/03/13 1000 03/03/13 1100  BP: 161/71 159/65 173/77 143/67  Pulse: 70 65 66   Temp: 97.9 F (36.6 C)     TempSrc: Oral     Resp: 17 20 19 12   Height:      Weight:      SpO2: 100% 100% 99%    Weight change:   Intake/Output Summary  (Last 24 hours) at 03/03/13 1223 Last data filed at 03/03/13 1100  Gross per 24 hour  Intake 4240.83 ml  Output   2795 ml  Net 1445.83 ml   Physical Exam General: somnolent with eyes closed, arousable to pain though remained drowsy and did not answer questions (pt had received morphine 2mg  5 minutes prior to my exam) HEENT: R eye appears swollen, difficult to visualize pupils; head wrapped with gauze Lungs: clear to ascultation bilaterally, normal work of respiration Heart: regular rate and rhythm, no murmurs, gallops, or rubs Abdomen: soft, +bowel sounds Extremities: warm bilaterally Neurologic: somnolent, arousable to painful stimulus; did not answer questions or follow commands  Lab Results: Basic Metabolic Panel:  Recent Labs Lab 02/26/13 0244  03/02/13 0333  03/02/13 1644 03/03/13 0430  NA 138  < > 139  < > 140 142  K 3.6*  < > 4.2  < > 4.3 4.1  CL 98  < > 100  --   --  107  CO2 25  < > 28  --   --  24  GLUCOSE 350*  < > 144*  < > 257* 123*  BUN 17  < > 28*  --   --  22  CREATININE 0.85  < > 0.93  --   --  0.86  CALCIUM 8.9  < > 8.4  --   --  7.4*  MG 2.1  --   --   --   --  1.7  PHOS  --   --   --   --   --  3.6  < > = values in this interval not displayed.  Liver Function Tests:  Recent Labs Lab 02/24/13 1602  AST 10  ALT 9  ALKPHOS 82  BILITOT <0.2*  PROT 7.5  ALBUMIN 3.5   CBC:  Recent Labs Lab 03/03/13 0100 03/03/13 0430  WBC 23.7* 23.9*  NEUTROABS 18.8*  --   HGB 11.0* 10.5*  HCT 33.7* 32.7*  MCV 78.6 78.4  PLT 237 230   Cardiac Enzymes:  Recent Labs Lab 02/25/13 0213 02/25/13 1015 02/25/13 1445 02/25/13 1958  CKTOTAL 34  --   --   --   TROPONINI  --  <0.30 <0.30 <0.30   CBG:  Recent Labs Lab 03/03/13 0337 03/03/13 0429 03/03/13 0538 03/03/13 0633 03/03/13 0738 03/03/13 0932  GLUCAP 150* 125* 118* 97 113* 109*   Coagulation:  Recent Labs Lab 03/02/13 0333  LABPROT 13.9  INR 1.09   Urine Drug Screen: Drugs of  Abuse     Component Value Date/Time   LABOPIA NONE DETECTED 02/25/2013 0237   LABOPIA PPS 06/27/2012 1645   COCAINSCRNUR NONE DETECTED 02/25/2013 0237   COCAINSCRNUR NEG 06/27/2012 1645   LABBENZ POSITIVE* 02/25/2013 0237   LABBENZ PPS 06/27/2012 1645   LABBENZ NEG 07/02/2006 2021   AMPHETMU NONE DETECTED 02/25/2013 0237   AMPHETMU NEG 07/02/2006 2021   THCU NONE DETECTED 02/25/2013 0237   LABBARB NONE DETECTED 02/25/2013 0237   LABBARB NEG 06/27/2012 1645    Urinalysis:  Recent Labs Lab 02/24/13 1835 02/25/13 0237  COLORURINE YELLOW YELLOW  LABSPEC 1.022 1.020  PHURINE 6.0 6.5  GLUCOSEU >1000* >1000*  HGBUR NEGATIVE TRACE*  BILIRUBINUR NEGATIVE NEGATIVE  KETONESUR NEGATIVE NEGATIVE  PROTEINUR NEGATIVE NEGATIVE  UROBILINOGEN 0.2 0.2  NITRITE NEGATIVE NEGATIVE  LEUKOCYTESUR MODERATE* SMALL*   Micro Results: Recent Results (from the past 240 hour(s))  URINE CULTURE     Status: None   Collection Time    02/25/13  2:37 AM      Result Value Range Status   Specimen Description URINE, RANDOM   Final   Special Requests NONE   Final   Culture  Setup Time     Final   Value: 02/25/2013 10:35     Performed at [facility]     Final   Value: >=100,000 COLONIES/ML     Performed at [facility]   Culture     Final   Value: Multiple bacterial morphotypes present, none predominant. Suggest appropriate recollection if clinically indicated.     Performed at [facility]   Report Status 02/26/2013 FINAL   Final  MRSA PCR SCREENING     Status: None   Collection Time    02/25/13  6:38 AM      Result Value Range Status   MRSA by PCR NEGATIVE  NEGATIVE Final   Comment:            The GeneXpert MRSA Assay (FDA     approved for NASAL specimens     only), is one component of a     comprehensive MRSA colonization     surveillance program. It is not     intended to diagnose MRSA     infection nor to guide or     monitor treatment for     MRSA  infections.   Studies/Results: Ct Head Wo Contrast (if New Onset  Seizure And/or Head Trauma) 02/26/12 02/25/2013 CLINICAL DATA: Possible seizure, confusion, altered mental status EXAM: CT HEAD WITHOUT CONTRAST TECHNIQUE: Contiguous axial images were obtained from the base of the skull through the vertex without intravenous contrast. COMPARISON: Prior MRI from 11/13/2010 and CT from 07/08/2010. FINDINGS: There is abnormal cortical thickening with loss of sulcation within the anterior and medial right frontal lobe in region of previously identified meningioma. This lesion appears markedly increased in size, although specific measurements are hard to obtain on is noncontrast head CT. A few scattered calcifications are seen within this region, as can be seen with meningiomas. There is increased vasogenic edema within the right frontal lobe with secondary partial effacement of the anterior horn of the right lateral ventricle and 8 mm of right-to-left midline shift now seen at the anterior falx. No hydrocephalus. No intracranial hemorrhage. No large vessel territory infarct. Remote lacunar infarct within the right basal ganglia again noted. Additional remote right frontal infarct also noted. Prominent atherosclerotic calcifications noted within the cavernous segments of the internal carotid arteries bilaterally. No extra-axial fluid collection. Calvarium is intact. Orbits are normal. Paranasal sinuses and mastoid air cells are clear. IMPRESSION: 1. Interval increase in size of right frontal meningioma with increased vasogenic edema and new 8 mm of right-to-left midline shift. Further evaluation with contrast-enhanced MRI is recommended. 2. No acute intracranial hemorrhage or infarct. 3. Remote right frontal and lacunar infarct within the right basal ganglia, unchanged. Critical Value/emergent results were called by telephone at the time of interpretation on 02/25/2013 at 3:22 AM to Dr. Delora Fuel , who verbally  acknowledged these results. Electronically Signed By: Jeannine Boga M.D. On: 02/25/2013 03:30   Dg Chest Port 1 View  03/02/2013   CLINICAL DATA:  Status post craniotomy.  Central line.  EXAM: PORTABLE CHEST - 1 VIEW  COMPARISON:  Single view of the chest 02/25/2013.  FINDINGS: A right IJ approach central venous catheter is in place with the tip projecting over the right atrium. There is no pneumothorax. Lung volumes are low with some basilar atelectasis. Heart size is mildly enlarged. No pleural effusion.  IMPRESSION: Right IJ catheter tip projects over the right atrium. Recommend withdrawal of 4-5 cm. No pneumothorax.  Cardiomegaly without edema.   Electronically Signed   By: Inge Rise M.D.   On: 03/02/2013 18:06   Medications: I have reviewed the patient's current medications. Scheduled Meds: . amLODipine  10 mg Oral Daily  . carvedilol  25 mg Oral BID WC  .  ceFAZolin (ANCEF) IV  2 g Intravenous Q8H  . dexamethasone  6 mg Intravenous Q6H  . furosemide  20 mg Oral Daily  . insulin aspart  0-15 Units Subcutaneous TID WC  . insulin aspart  0-5 Units Subcutaneous QHS  . [START ON 03/04/2013] insulin glargine  20 Units Subcutaneous Daily  . levETIRAcetam  500 mg Intravenous Q12H  . lisinopril  5 mg Oral Daily  . pantoprazole  40 mg Oral QHS  . senna  1 tablet Oral BID  . sodium chloride  3 mL Intravenous Q12H   Continuous Infusions: . sodium chloride 20 mL/hr at 03/03/13 1034   PRN Meds:.acetaminophen, acetaminophen, bisacodyl, dextrose, hydrALAZINE, HYDROcodone-acetaminophen, labetalol, morphine injection, naLOXone (NARCAN)  injection, ondansetron (ZOFRAN) IV, ondansetron, promethazine  Assessment/Plan: Meningioma causing midline R-->L shift, now s/p removal of tumor on 1/15- Patient had not had any seizure activity since 1/10 until this AM when she had a small focal R sided seizure at 1200 this afternoon, s/p 2mg .  Neurosurgery continues to follow. Of note, patient has  leukocytosis to 23.9 w/ neutrophil predominance that began after starting decadron, so neutrophil demargination 2/2 steroid therapy is this is the most likely explanation for her elevated WBC count. -BMP, CBC, Mg, Phos in AM (consider changing daily labs to as needed) -Appreciate neurosurgery recommendations  -morphine 1-2mg  q2h prn and norco 5-325mg  q4h prn for pain -Seizure precautions  -Frequent neurochecks  -PT/OT to eval  -consider consulting neurology -phenergan prn for nausea (avoid QT prolonging meds)   *Lacy Duverney family spokesperson [phone] or [phone]   Asymptomatic bacteriuria: UA was contaminated. UCx showed multiple bacterial morphotypes, none predominant with >100K colonies. Unable to assess symptoms today, but has been asymptomatic. She is on cefazolin postop per neurosurgery, so will continue this, though do not think this is a UTI  Uncontrolled Type I Diabetes Mellitus- Last HbA1c of 12.1 on 12/23/12. Pt on lantus 15U qHS and aspart 1-5 U BID before lunch and dinner at home. Patient has been hyperglycemic this admission and had been managed on insulin gtt until 1/16. She is now on lantus 20U qHS and SSI moderate. CBGs 97-131 over the last 24hrs. -lantus 20U qHS -SSI -appreciate support of diabetes coordinator -carb mod diet  Hypertension - BPs elevated, 140-160s/60-70s.  -Continue home furosemide 20 mg daily, amlodipine 10 mg daily, carvedilol 25 mg BID, lisinopril 5 mg daily  -Hydralazine 5 mg q6h prn BP >180/115  -d/c labetalol   Normocytic Anemia - Currently stable without active bleeding. Hb baseline 10.5 today, her baseline is 10-12. Etiology unknown. Last anemia panel done on 03/2010: Fe 55, TIBC 223, Ferritin 112.  -CBC in AM  Hyperlipidemia - Last lipid panel 10/2010 with hypercholesteremia (LDL 147 total 213)  -Continue home simvastatin 20 mg daily   GERD - Stable. Pt on 20 mg protonix at home.  -Continue protonix 40 mg daily   Hypokalemia - Admitted  with K 3.1, though this has resolved after supplementation. K 4.1 today. Most likely due to poor PO intake.  -Replete as needed -Continue to monitor BMP   Diet: Carb modified   DVT Ppx: SCDs -likely advance to heparin tomorrow (will need to ask neurosurgery)  Code: Full  Dispo: Disposition is deferred at this time, awaiting improvement of current medical problems.  Anticipated discharge in approximately 2-3 day(s).   The patient does have a current PCP Jerene Pitch, MD) and does need an [facility] hospital follow-up appointment after discharge.  The patient does not have transportation limitations that hinder transportation to clinic appointments.  .Services Needed at time of discharge: Y = Yes, Blank = No PT:   OT:   RN:   Equipment:   Other:     LOS: 6 days   Rebecca Eaton, MD 03/03/2013, 12:23 PM

## 2013-03-03 NOTE — Progress Notes (Signed)
Patient ID: [name], female   DOB: 11-May-1933, 80 y.o.   MRN: [id number] BP 142/61  Pulse 79  Temp(Src) 99 F (37.2 C) (Oral)  Resp 20  Ht 5\' 4"  (1.626 m)  Wt 86.4 kg (190 lb 7.6 oz)  BMI 32.68 kg/m2  SpO2 96% Alert and oriented x 4, voice clear Moving all extremities well.  Perrl, full eom Tongue and uvula midline Possible seizure activity today, not clear according to nurse. I did increase Keppra Dressing dry and intact.  Transfer to floor tomorrow.

## 2013-03-04 LAB — CBC
HEMATOCRIT: 32 % — AB (ref 36.0–46.0)
Hemoglobin: 10.2 g/dL — ABNORMAL LOW (ref 12.0–15.0)
MCH: 25.3 pg — AB (ref 26.0–34.0)
MCHC: 31.9 g/dL (ref 30.0–36.0)
MCV: 79.4 fL (ref 78.0–100.0)
Platelets: 219 10*3/uL (ref 150–400)
RBC: 4.03 MIL/uL (ref 3.87–5.11)
RDW: 16.9 % — ABNORMAL HIGH (ref 11.5–15.5)
WBC: 24.7 10*3/uL — ABNORMAL HIGH (ref 4.0–10.5)

## 2013-03-04 LAB — BASIC METABOLIC PANEL
BUN: 18 mg/dL (ref 6–23)
CHLORIDE: 102 meq/L (ref 96–112)
CO2: 23 mEq/L (ref 19–32)
Calcium: 7.5 mg/dL — ABNORMAL LOW (ref 8.4–10.5)
Creatinine, Ser: 0.86 mg/dL (ref 0.50–1.10)
GFR calc Af Amer: 74 mL/min — ABNORMAL LOW (ref >90)
GFR calc non Af Amer: 64 mL/min — ABNORMAL LOW (ref >90)
Glucose, Bld: 271 mg/dL — ABNORMAL HIGH (ref 70–99)
Potassium: 4.2 mEq/L (ref 3.7–5.3)
Sodium: 140 mEq/L (ref 137–147)

## 2013-03-04 LAB — PHOSPHORUS: Phosphorus: 3.9 mg/dL (ref 2.3–4.6)

## 2013-03-04 LAB — GLUCOSE, CAPILLARY
GLUCOSE-CAPILLARY: 235 mg/dL — AB (ref 70–99)
GLUCOSE-CAPILLARY: 253 mg/dL — AB (ref 70–99)
Glucose-Capillary: 234 mg/dL — ABNORMAL HIGH (ref 70–99)
Glucose-Capillary: 353 mg/dL — ABNORMAL HIGH (ref 70–99)

## 2013-03-04 LAB — MAGNESIUM: Magnesium: 1.7 mg/dL (ref 1.5–2.5)

## 2013-03-04 MED ORDER — LORAZEPAM 2 MG/ML IJ SOLN: 1.0000 mg | Freq: Once | INTRAMUSCULAR | Status: AC | PRN

## 2013-03-04 NOTE — Progress Notes (Signed)
Subjective: [name] was seen and examined at bedside.  She is arousable but sleepy this morning and reports feeling well. She denies any headache and says she was able to eat some of her food. She denies BM but has been urinating.  No family in room present today.   Objective: Vital signs in last 24 hours: Filed Vitals:   03/04/13 1300 03/04/13 1400 03/04/13 1500 03/04/13 1610  BP:  132/51 148/58   Pulse: 90 75 73   Temp:    98.2 F (36.8 C)  TempSrc:    Oral  Resp: 17 17 15    Height:      Weight:      SpO2: 100% 97% 97%    Weight change:   Intake/Output Summary (Last 24 hours) at 03/04/13 1622 Last data filed at 03/04/13 1500  Gross per 24 hour  Intake    990 ml  Output      0 ml  Net    990 ml   Vitals reviewed. General: resting in bed, sleepy HEENT: R eye swollen with clear sticky drainage on eyelid of left eye, able to open both eyes, but left more than right. Head in C/D/I dressing. Cardiac: RRR Pulm: clear to auscultation bilaterally, no wheezes, rales, or rhonchi Abd: soft, nontender, nondistended, BS present Ext: warm and well perfused, no pedal edema Neuro: alert and oriented X3 but sleepy  Lab Results: Basic Metabolic Panel:  Recent Labs Lab 03/03/13 0430 03/04/13 0250  NA 142 140  K 4.1 4.2  CL 107 102  CO2 24 23  GLUCOSE 123* 271*  BUN 22 18  CREATININE 0.86 0.86  CALCIUM 7.4* 7.5*  MG 1.7 1.7  PHOS 3.6 3.9   CBC:  Recent Labs Lab 03/03/13 0100 03/03/13 0430 03/04/13 0250  WBC 23.7* 23.9* 24.7*  NEUTROABS 18.8*  --   --   HGB 11.0* 10.5* 10.2*  HCT 33.7* 32.7* 32.0*  MCV 78.6 78.4 79.4  PLT 237 230 219   Cardiac Enzymes:  Recent Labs Lab 02/25/13 1958  TROPONINI <0.30   CBG:  Recent Labs Lab 03/03/13 0932 03/03/13 1159 03/03/13 1655 03/03/13 2117 03/04/13 0746 03/04/13 1220  GLUCAP 109* 131* 139* 184* 234* 235*   Coagulation:  Recent Labs Lab 03/02/13 0333  LABPROT 13.9  INR 1.09   Urine Drug Screen: Drugs  of Abuse     Component Value Date/Time   LABOPIA NONE DETECTED 02/25/2013 0237   LABOPIA PPS 06/27/2012 1645   COCAINSCRNUR NONE DETECTED 02/25/2013 0237   COCAINSCRNUR NEG 06/27/2012 1645   LABBENZ POSITIVE* 02/25/2013 0237   LABBENZ PPS 06/27/2012 1645   LABBENZ NEG 07/02/2006 2021   AMPHETMU NONE DETECTED 02/25/2013 0237   AMPHETMU NEG 07/02/2006 2021   THCU NONE DETECTED 02/25/2013 0237   LABBARB NONE DETECTED 02/25/2013 0237   LABBARB NEG 06/27/2012 1645    Micro Results: Recent Results (from the past 240 hour(s))  URINE CULTURE     Status: None   Collection Time    02/25/13  2:37 AM      Result Value Range Status   Specimen Description URINE, RANDOM   Final   Special Requests NONE   Final   Culture  Setup Time     Final   Value: 02/25/2013 10:35     Performed at [facility]     Final   Value: >=100,000 COLONIES/ML     Performed at [facility]  Final   Value: Multiple bacterial morphotypes present, none predominant. Suggest appropriate recollection if clinically indicated.     Performed at [facility]   Report Status 02/26/2013 FINAL   Final  MRSA PCR SCREENING     Status: None   Collection Time    02/25/13  6:38 AM      Result Value Range Status   MRSA by PCR NEGATIVE  NEGATIVE Final   Comment:            The GeneXpert MRSA Assay (FDA     approved for NASAL specimens     only), is one component of a     comprehensive MRSA colonization     surveillance program. It is not     intended to diagnose MRSA     infection nor to guide or     monitor treatment for     MRSA infections.   Studies/Results: Dg Chest Port 1 View  03/02/2013   CLINICAL DATA:  Status post craniotomy.  Central line.  EXAM: PORTABLE CHEST - 1 VIEW  COMPARISON:  Single view of the chest 02/25/2013.  FINDINGS: A right IJ approach central venous catheter is in place with the tip projecting over the right atrium. There is no pneumothorax. Lung volumes are  low with some basilar atelectasis. Heart size is mildly enlarged. No pleural effusion.  IMPRESSION: Right IJ catheter tip projects over the right atrium. Recommend withdrawal of 4-5 cm. No pneumothorax.  Cardiomegaly without edema.   Electronically Signed   By: Inge Rise M.D.   On: 03/02/2013 18:06   Medications: I have reviewed the patient's current medications. Scheduled Meds: . amLODipine  10 mg Oral Daily  . carvedilol  25 mg Oral BID WC  .  ceFAZolin (ANCEF) IV  2 g Intravenous Q8H  . dexamethasone  6 mg Intravenous Q6H  . furosemide  20 mg Oral Daily  . insulin aspart  0-15 Units Subcutaneous TID WC  . insulin aspart  0-5 Units Subcutaneous QHS  . insulin glargine  20 Units Subcutaneous Daily  . levETIRAcetam  500 mg Intravenous Q12H  . lisinopril  5 mg Oral Daily  . pantoprazole  40 mg Oral QHS  . senna  1 tablet Oral BID  . simvastatin  20 mg Oral q1800  . sodium chloride  3 mL Intravenous Q12H   Continuous Infusions: . sodium chloride 20 mL/hr at 03/04/13 0600   PRN Meds:.acetaminophen, acetaminophen, bisacodyl, hydrALAZINE, HYDROcodone-acetaminophen, morphine injection, naLOXone (NARCAN)  injection, promethazine Assessment/Plan: Principal Problem:   Cerebral meningioma with midline shift Active Problems:   DIABETES MELLITUS, TYPE II   HYPERTENSION   Vasogenic brain edema   GERD  Cerebral R meningioma with midline shift s/p craniotomy and tumor excision 03/02/13. -appreciate neurosurgery following.  Spoke with Dr. Joya Salm on the phone today, okay to d/c abx, continue Decadron 6mg  IV q6h for now and will slowly transition down after a few days. Okay to try to start PT and will eventually transition to PO keppra -continue IV Keppra bid -pain control: PRN Norco and morphine -tylenol prn -ativan prn for recurrent seizures -neuro checks -seizure precautions -SCDs -PT/OT -phenergan prn nausea -senna and dulcolax prn  DM2--poorly controlled. cbg's elevated in  setting of steroids.  Needed to be on glucostabilizer prior to surgery for improved control. Post op has been okay with [place] insulin for now but will need to titrate up accordingly.  HbA1C 12.1 this admission.  -CBG monitoring -Lantus 20 units  -SSI  moderate -HS sliding scale  HTN--improved control during admission. Home medications included norvasc, coreg, and lasix.  -continue norvasc 10mg , coreg 25mg  bid, lasix 20mg  qd, lisinopril 5mg  -hydralazine 5mg  q6h prn  -continue statin  GERD-stable -continue protonix  Dispo: Disposition is deferred at this time, awaiting improvement of current medical problems.  Diet: Carb modified DVT: SCDs for now post op  The patient does have a current PCP Jerene Pitch, MD) and does need an [facility] hospital follow-up appointment after discharge.  The patient does not have transportation limitations that hinder transportation to clinic appointments.  Services Needed at time of discharge: Y = Yes, Blank = No PT: Home health, 24 hour supervision  OT:   RN:   Equipment:   Other:     LOS: 7 days   Jerene Pitch, MD 03/04/2013, 4:22 PM

## 2013-03-04 NOTE — Progress Notes (Signed)
Awake, f/c. Moves all 4 extremities. Ate. Vs  Wnl. Plan  to floor later on today

## 2013-03-04 NOTE — Progress Notes (Signed)
  Date: 03/04/2013  Patient name: [name]  Medical record number: [id number]  Date of birth: Jun 25, 1933   This patient has been seen and the plan of care was discussed with the house staff. Please see their note for complete details. I concur with their findings with the following additions/corrections: She is lying comfortable in bed. Her level of responsiveness is no different than in Dec admission. Denies CP, SOB. States slept well and had good appetite.   Bartholomew Crews, MD 03/04/2013, 1:36 PM

## 2013-03-04 NOTE — Evaluation (Signed)
Physical Therapy Evaluation Patient Details Name: [name] MRN: [id number] DOB: [birth year] Today's Date: 03/04/2013 Time: 7893-8101 PT Time Calculation (min): 25 min  PT Assessment / Plan / Recommendation History of Present Illness  The patient is a 80 yo woman, history of meningiomas, presenting 1/10 with new-onset seizures, with CT showing edema and midline shift surrounding meningioma, s/p resection of meningioma 1/15.  Clinical Impression  Pt adm due to the above. S/p craniotomy 1/15. Pt presents with limitations in independence with functional mobility. Will benefit from skilled acute PT to address deficits listed below and increase independence with mobility and safety prior to D/C home with daughter. Anticipate good progress. Pt motivated for OOB activities with PT today.      PT Assessment  Patient needs continued PT services    Follow Up Recommendations  Supervision/Assistance - 24 hour;Home health PT    Does the patient have the potential to tolerate intense rehabilitation      Barriers to Discharge        Equipment Recommendations  None recommended by PT    Recommendations for Other Services OT consult   Frequency Min 3X/week    Precautions / Restrictions Precautions Precautions: Fall Restrictions Weight Bearing Restrictions: No   Pertinent Vitals/Pain Stated "my head kind of hurts, but not bad"      Mobility  Bed Mobility Overal bed mobility: Needs Assistance Bed Mobility: Supine to Sit Supine to sit: Min assist;HOB elevated General bed mobility comments: (A) to bring shoulders to upright sitting position; pt required incr time and HOB elevated; cues for hand placement and sequencing Transfers Overall transfer level: Needs assistance Equipment used: None Transfers: Sit to/from Stand Sit to Stand: Min assist General transfer comment: (A) to achieve upright standing position and maintain balance; pt with Rt UE supported; cues for hand placement and  sequencing; pt had difficulty controlling descent to chair  Ambulation/Gait Ambulation/Gait assistance: Min assist Ambulation Distance (Feet): 10 Feet Assistive device: 1 person hand held assist Gait Pattern/deviations: Decreased stride length;Narrow base of support Gait velocity: decreased  Gait velocity interpretation: Below normal speed for age/gender General Gait Details: one person handheld (A) to steady; pt unsteady with gt but did not demo LOB; will benefit from RW to increase stability. cues for safety and sequencing          PT Diagnosis: Difficulty walking;Generalized weakness  PT Problem List: Decreased strength;Decreased activity tolerance;Decreased balance;Decreased mobility;Decreased cognition;Decreased knowledge of use of DME;Decreased safety awareness;Decreased knowledge of precautions PT Treatment Interventions: DME instruction;Gait training;Stair training;Functional mobility training;Therapeutic activities;Therapeutic exercise;Balance training;Neuromuscular re-education;Patient/family education     PT Goals(Current goals can be found in the care plan section) Acute Rehab PT Goals Patient Stated Goal: to go home with daughter PT Goal Formulation: With patient Time For Goal Achievement: 03/18/13 Potential to Achieve Goals: Good  Visit Information  Last PT Received On: 03/04/13 Assistance Needed: +1 History of Present Illness: The patient is a 80 yo woman, history of meningiomas, presenting 1/10 with new-onset seizures, with CT showing edema and midline shift surrounding meningioma, s/p resection of meningioma 1/15.       [name] expects to be discharged to:: Private residence Living Arrangements: Children Available Help at Discharge: Family;Available 24 hours/day Type of Home: House Home Access: Stairs to enter [employer] of Steps: 5 Entrance Stairs-Rails: Right Home Layout: One level Home Equipment: Cane -  single point;Shower seat - built in;Walker - 2 wheels Prior Function Level of Independence: Independent with assistive device(s) Comments:  pt reports she ambulated with RW or cane at home; reports she was independent with ADLs but has her daughter with her at all times  Communication Communication: No difficulties Dominant Hand: Left    Cognition  Cognition Arousal/Alertness: Lethargic;Suspect due to medications (initially ) Behavior During Therapy: WFL for tasks assessed/performed Overall Cognitive Status: Within Functional Limits for tasks assessed    Extremity/Trunk Assessment Upper Extremity Assessment Upper Extremity Assessment: Defer to OT evaluation Lower Extremity Assessment Lower Extremity Assessment: RLE deficits/detail;LLE deficits/detail RLE Deficits / Details: knees WFL; hip 3+/5  RLE Sensation:  (WFL to light touch ) LLE Deficits / Details: knee WFL; hip 3+/5 LLE Sensation:  (WFL to light touch ) Cervical / Trunk Assessment Cervical / Trunk Assessment: Normal   Balance Balance Overall balance assessment: Needs assistance Sitting-balance support: Bilateral upper extremity supported;Feet supported Sitting balance-Leahy Scale: Good Standing balance support: Single extremity supported;During functional activity Standing balance-Leahy Scale: Fair  End of Session PT - End of Session Equipment Utilized During Treatment: Gait belt Activity Tolerance: Patient tolerated treatment well Patient left: in chair;with call bell/phone within reach Nurse Communication: Mobility status  GP     Gustavus Bryant, [place] 03/04/2013, 1:06 PM

## 2013-03-05 LAB — PHOSPHORUS: PHOSPHORUS: 2.6 mg/dL (ref 2.3–4.6)

## 2013-03-05 LAB — BASIC METABOLIC PANEL
BUN: 22 mg/dL (ref 6–23)
CALCIUM: 7.7 mg/dL — AB (ref 8.4–10.5)
CHLORIDE: 103 meq/L (ref 96–112)
CO2: 24 meq/L (ref 19–32)
Creatinine, Ser: 0.88 mg/dL (ref 0.50–1.10)
GFR calc Af Amer: 72 mL/min — ABNORMAL LOW (ref >90)
GFR calc non Af Amer: 62 mL/min — ABNORMAL LOW (ref >90)
Glucose, Bld: 285 mg/dL — ABNORMAL HIGH (ref 70–99)
Potassium: 4.3 mEq/L (ref 3.7–5.3)
SODIUM: 139 meq/L (ref 137–147)

## 2013-03-05 LAB — MAGNESIUM: Magnesium: 1.8 mg/dL (ref 1.5–2.5)

## 2013-03-05 LAB — GLUCOSE, CAPILLARY
GLUCOSE-CAPILLARY: 247 mg/dL — AB (ref 70–99)
Glucose-Capillary: 293 mg/dL — ABNORMAL HIGH (ref 70–99)
Glucose-Capillary: 293 mg/dL — ABNORMAL HIGH (ref 70–99)
Glucose-Capillary: 314 mg/dL — ABNORMAL HIGH (ref 70–99)

## 2013-03-05 MED ORDER — INSULIN ASPART 100 UNIT/ML ~~LOC~~ SOLN: 5.0000 [IU] | Freq: Three times a day (TID) | SUBCUTANEOUS | Status: DC

## 2013-03-05 MED ORDER — INSULIN ASPART 100 UNIT/ML ~~LOC~~ SOLN
0.0000 [IU] | Freq: Three times a day (TID) | SUBCUTANEOUS | Status: DC
  Administered 2013-03-05 (×2): 11 [IU] via SUBCUTANEOUS
  Administered 2013-03-06: 15 [IU] via SUBCUTANEOUS
  Administered 2013-03-06: 11 [IU] via SUBCUTANEOUS
  Administered 2013-03-06: 20 [IU] via SUBCUTANEOUS
  Administered 2013-03-07: 11 [IU] via SUBCUTANEOUS
  Administered 2013-03-07: 7 [IU] via SUBCUTANEOUS

## 2013-03-05 MED ORDER — INSULIN GLARGINE 100 UNIT/ML ~~LOC~~ SOLN
25.0000 [IU] | Freq: Every day | SUBCUTANEOUS | Status: DC
  Administered 2013-03-06 – 2013-03-07 (×2): 25 [IU] via SUBCUTANEOUS
  Filled 2013-03-05 (×2): qty 0.25

## 2013-03-05 MED ORDER — LEVETIRACETAM 500 MG PO TABS
500.0000 mg | ORAL_TABLET | Freq: Two times a day (BID) | ORAL | Status: DC
  Administered 2013-03-05 – 2013-03-07 (×4): 500 mg via ORAL
  Filled 2013-03-05 (×5): qty 1

## 2013-03-05 MED ORDER — INSULIN ASPART 100 UNIT/ML ~~LOC~~ SOLN
0.0000 [IU] | Freq: Every day | SUBCUTANEOUS | Status: DC
  Administered 2013-03-05 – 2013-03-06 (×2): 4 [IU] via SUBCUTANEOUS

## 2013-03-05 NOTE — Progress Notes (Addendum)
Subjective:  Pt seen and examined. No acute events overnight with no reported seizure activity. She is alert and orientated.  She reports bifrontal headache and episode of NBNB emesis yesterday. Pt denies vision change, weakness, paraesthesias, syncope, fever, chills, cough, dyspnea, chest pain, abdominal pain, change in BM or urination. She eeports ambulating from bed to chair yesterday without difficulty.    Objective: Vital signs in last 24 hours: Filed Vitals:   03/05/13 0400 03/05/13 0500 03/05/13 0600 03/05/13 0700  BP: 162/62 146/68 142/59 161/66  Pulse: 72 76 68 69  Temp:      TempSrc:      Resp: 15 17 17 17   Height:      Weight:      SpO2: 96% 97% 96% 95%   Weight change:   Intake/Output Summary (Last 24 hours) at 03/05/13 0838 Last data filed at 03/05/13 0600  Gross per 24 hour  Intake    645 ml  Output      0 ml  Net    645 ml   Vitals reviewed.  Constitutional: She is oriented to person, place, and time and well-developed, well-nourished, and in no distress. Head: Clean, dry and intact dressing Eyes: Conjunctivae and EOM are normal. Droopy eyes bilaterally Cardiovascular: Normal rate, regular rhythm, normal heart sounds. Exam reveals no gallop and no friction rub. No murmur heard.  Pulmonary/Chest: Effort normal and breath sounds normal. No respiratory distress. She has no wheezes. She has no rales. She exhibits no tenderness.  Abdominal: Soft. Bowel sounds are normal. She exhibits no distension. There is no abdominal tenderness. There is no rigidity, no rebound and no guarding.  Neurological: She is oriented to person, place, and time. She has normal sensation and normal strength. She displays facial symmetry. No cranial nerve deficit.  Extremities: no LE edema   Lab Results: Basic Metabolic Panel:  Recent Labs Lab 03/04/13 0250 03/05/13 0340  NA 140 139  K 4.2 4.3  CL 102 103  CO2 23 24  GLUCOSE 271* 285*  BUN 18 22  CREATININE 0.86 0.88    CALCIUM 7.5* 7.7*  MG 1.7 1.8  PHOS 3.9 2.6   CBC:  Recent Labs Lab 03/03/13 0100 03/03/13 0430 03/04/13 0250  WBC 23.7* 23.9* 24.7*  NEUTROABS 18.8*  --   --   HGB 11.0* 10.5* 10.2*  HCT 33.7* 32.7* 32.0*  MCV 78.6 78.4 79.4  PLT 237 230 219   Cardiac Enzymes: No results found for this basename: CKTOTAL, CKMB, CKMBINDEX, TROPONINI,  in the last 168 hours CBG:  Recent Labs Lab 03/03/13 2117 03/04/13 0746 03/04/13 1220 03/04/13 1726 03/04/13 2148 03/05/13 0815  GLUCAP 184* 234* 235* 253* 353* 247*   Coagulation:  Recent Labs Lab 03/02/13 0333  LABPROT 13.9  INR 1.09   Urine Drug Screen: Drugs of Abuse     Component Value Date/Time   LABOPIA NONE DETECTED 02/25/2013 0237   LABOPIA PPS 06/27/2012 1645   COCAINSCRNUR NONE DETECTED 02/25/2013 0237   COCAINSCRNUR NEG 06/27/2012 1645   LABBENZ POSITIVE* 02/25/2013 0237   LABBENZ PPS 06/27/2012 1645   LABBENZ NEG 07/02/2006 2021   AMPHETMU NONE DETECTED 02/25/2013 0237   AMPHETMU NEG 07/02/2006 2021   THCU NONE DETECTED 02/25/2013 0237   LABBARB NONE DETECTED 02/25/2013 0237   LABBARB NEG 06/27/2012 1645    Micro Results: Recent Results (from the past 240 hour(s))  URINE CULTURE     Status: None   Collection Time    02/25/13  2:31 AM      Result Value Range Status   Specimen Description URINE, RANDOM   Final   Special Requests NONE   Final   Culture  Setup Time     Final   Value: 02/25/2013 10:35     Performed at [facility]     Final   Value: >=100,000 COLONIES/ML     Performed at [facility]   Culture     Final   Value: Multiple bacterial morphotypes present, none predominant. Suggest appropriate recollection if clinically indicated.     Performed at [facility]   Report Status 02/26/2013 FINAL   Final  MRSA PCR SCREENING     Status: None   Collection Time    02/25/13  6:38 AM      Result Value Range Status   MRSA by PCR NEGATIVE  NEGATIVE Final   Comment:             The GeneXpert MRSA Assay (FDA     approved for NASAL specimens     only), is one component of a     comprehensive MRSA colonization     surveillance program. It is not     intended to diagnose MRSA     infection nor to guide or     monitor treatment for     MRSA infections.   Studies/Results: No results found. Medications: I have reviewed the patient's current medications. Scheduled Meds: . amLODipine  10 mg Oral Daily  . carvedilol  25 mg Oral BID WC  . dexamethasone  6 mg Intravenous Q6H  . furosemide  20 mg Oral Daily  . insulin aspart  0-15 Units Subcutaneous TID WC  . insulin aspart  0-5 Units Subcutaneous QHS  . insulin glargine  20 Units Subcutaneous Daily  . levETIRAcetam  500 mg Intravenous Q12H  . lisinopril  5 mg Oral Daily  . pantoprazole  40 mg Oral QHS  . senna  1 tablet Oral BID  . simvastatin  20 mg Oral q1800  . sodium chloride  3 mL Intravenous Q12H   Continuous Infusions: . sodium chloride 20 mL/hr at 03/04/13 1730   PRN Meds:.acetaminophen, acetaminophen, bisacodyl, hydrALAZINE, HYDROcodone-acetaminophen, morphine injection, naLOXone (NARCAN)  injection, promethazine Assessment/Plan:  Cerebral R meningioma with midline shift s/p craniotomy and tumor excision 03/02/13 - POD # 2 with last seizure actibity on 1/10 and possibly on 1/16 -Appreciate neurosurgery recommendations --> slowly transition down IV corticosteroids -Continue Decadron 6mg  IV Q6hr  -Transition IV 500 mg BID to PO keppra -Continue simvastatin 20 mg daily  -Ativan PRN recurrent seizures -IV morphine, PO tylenol PRN pain  -Frequent neuro checks -Seizure precautions -Phenergan prn nausea -PT/OT consults  Uncontrolled Type I Diabetes Mellitus - CBG 247.  Last HbA1c of 12.1 on 12/23/12. Pt at home on 15 U Lantus and 1-5 U BID before lunch and dinner. Pt required glucostabilizer prior to surgery for improved control.  -Appreciate diabetes coordinator recommendations -Frequent CBG  monitoring -Yesterday required 20 U Lantus, Novolog 18U with meals and 5 U at night -Lantus 20 units to 25U   -Meal time coverage 5 U TID -SSI moderate to resistant  -HS sliding scale   Hypertension - currently hypertensive -Continue home furosemide 20 mg daily  -Continue home amlodipine 10 mg daily  -Continue home carvedilol 25 mg BID  -Continue lisinopril 5 mg daily -Hydralazine 5 mg PRN SBP>180/100  Microcytic/Normocytic Anemia - currently stable without active bleeding  after surgery.  Pt with last Hg 10.2 on 1/17 with baseline 10-12. Etiology unknown.  -Continue to monitor CBC --> stable  -Continue to monitor for bleeding  -Consider anemia panel  -Obtain PT/PTT --> wnl   Hyperlipidemia - Last lipid panel 10/2010 with hypercholesteremia (LDL 147 total 213)  -Continue home simvastatin 20 mg daily   GERD - currently without reflux symptoms. Pt on 20 mg protonix at home.  -Continue protonix 40 mg daily   Hypokalemia - resolved. Most likely due to poor PO intake.  -Replete with PO potassium chloride as needed  -Continue to monitor BMP   Dispo: Disposition is deferred at this time, awaiting improvement of current medical problems.  Diet: Carb modified DVT: SCDs for now post op  The patient does have a current PCP Jerene Pitch, MD) and does need an [facility] hospital follow-up appointment after discharge.  The patient does not have transportation limitations that hinder transportation to clinic appointments.  Services Needed at time of discharge: Y = Yes, Blank = No PT: Home health, 24 hour supervision  OT:   RN:   Equipment:   Other:     LOS: 8 days   Juluis Mire, MD 03/05/2013, 8:38 AM

## 2013-03-05 NOTE — Progress Notes (Signed)
Patient ID: [name], female   DOB: [birth year], 80 y.o.   MRN: [id number] No c/o. Doing great. No headache

## 2013-03-06 LAB — TYPE AND SCREEN
ABO/RH(D): O POS
Antibody Screen: NEGATIVE
UNIT DIVISION: 0
Unit division: 0
Unit division: 0
Unit division: 0

## 2013-03-06 LAB — GLUCOSE, CAPILLARY
GLUCOSE-CAPILLARY: 302 mg/dL — AB (ref 70–99)
Glucose-Capillary: 364 mg/dL — ABNORMAL HIGH (ref 70–99)
Glucose-Capillary: 288 mg/dL — ABNORMAL HIGH (ref 70–99)
Glucose-Capillary: 325 mg/dL — ABNORMAL HIGH (ref 70–99)

## 2013-03-06 MED ORDER — POLYETHYLENE GLYCOL 3350 17 G PO PACK
17.0000 g | PACK | Freq: Once | ORAL | Status: DC
  Filled 2013-03-06 (×2): qty 1

## 2013-03-06 MED ORDER — ALUM & MAG HYDROXIDE-SIMETH 200-200-20 MG/5ML PO SUSP
15.0000 mL | Freq: Once | ORAL | Status: AC
  Administered 2013-03-06: 15 mL via ORAL

## 2013-03-06 MED ORDER — DEXAMETHASONE 4 MG PO TABS
4.0000 mg | ORAL_TABLET | Freq: Two times a day (BID) | ORAL | Status: DC
  Administered 2013-03-07: 4 mg via ORAL
  Filled 2013-03-06 (×2): qty 1

## 2013-03-06 MED ORDER — INSULIN ASPART 100 UNIT/ML ~~LOC~~ SOLN
5.0000 [IU] | Freq: Three times a day (TID) | SUBCUTANEOUS | Status: DC
  Administered 2013-03-06 – 2013-03-07 (×3): 5 [IU] via SUBCUTANEOUS

## 2013-03-06 NOTE — Evaluation (Signed)
Occupational Therapy Evaluation Patient Details Name: [name] MRN: [id number] DOB: [birth year] Today's Date: 03/06/2013 Time: 3329-5188 OT Time Calculation (min): 21 min  OT Assessment / Plan / Recommendation History of present illness The patient is a 80 yo woman, history of meningiomas, presenting 1/10 with new-onset seizures, with CT showing edema and midline shift surrounding meningioma, s/p resection of meningioma 1/15.   Clinical Impression   Pt demos decline in function with ADLs and ADL mobility safety and would benefit from acute OT services to address impairments to increase level of function and safety    OT Assessment  Patient needs continued OT Services    Follow Up Recommendations  Home health OT;Supervision/Assistance - 24 hour    Barriers to Discharge   none  Equipment Recommendations       Recommendations for Other Services    Frequency  Min 2X/week    Precautions / Restrictions Precautions Precautions: Fall Restrictions Weight Bearing Restrictions: No   Pertinent Vitals/Pain 2/10 head/neck    ADL  Grooming: Performed;Wash/dry hands;Wash/dry face;Min guard Where Assessed - Grooming: Supported standing Upper Body Bathing: Simulated;Supervision/safety;Set up Where Assessed - Upper Body Bathing: Unsupported sitting Lower Body Bathing: Simulated;Moderate assistance Upper Body Dressing: Performed;Supervision/safety;Set up Where Assessed - Upper Body Dressing: Unsupported sitting Lower Body Dressing: Performed;Moderate assistance Toilet Transfer: Performed;Minimal assistance Toilet Transfer Method: Sit to stand Toilet Transfer Equipment: Regular height toilet;Grab bars;Raised toilet seat with arms (or 3-in-1 over toilet) Toileting - Clothing Manipulation and Hygiene: Performed;Minimal assistance Where Assessed - Toileting Clothing Manipulation and Hygiene: Standing Tub/Shower Transfer Method: Not assessed Equipment Used: Gait belt;Rolling walker;Other  (comment) (3 in 1) Transfers/Ambulation Related to ADLs: Assist to come to full standing as well as for controlled descent to chair.     OT Diagnosis: Generalized weakness;Acute pain  OT Problem List: Decreased strength;Decreased knowledge of use of DME or AE;Decreased activity tolerance;Impaired balance (sitting and/or standing) OT Treatment Interventions: Self-care/ADL training;Therapeutic exercise;Patient/family education;Therapeutic activities;DME and/or AE instruction   OT Goals(Current goals can be found in the care plan section) Acute Rehab OT Goals Patient Stated Goal: to go home with daughter OT Goal Formulation: With patient Time For Goal Achievement: 03/13/13 Potential to Achieve Goals: Good ADL Goals Pt Will Perform Grooming: with supervision;with set-up;standing Pt Will Perform Lower Body Bathing: with min assist;sitting/lateral leans;sit to/from stand Pt Will Perform Lower Body Dressing: with min assist;sit to/from stand;sitting/lateral leans Pt Will Transfer to Toilet: with min guard assist;with supervision;ambulating;regular height toilet;grab bars Pt Will Perform Toileting - Clothing Manipulation and hygiene: with min guard assist;with supervision  Visit Information  Last OT Received On: 03/06/13 Assistance Needed: +1 History of Present Illness: The patient is a 80 yo woman, history of meningiomas, presenting 1/10 with new-onset seizures, with CT showing edema and midline shift surrounding meningioma, s/p resection of meningioma 1/15.       Prior [place] expects to be discharged to:: Private residence Living Arrangements: Children Available Help at Discharge: Family;Available 24 hours/day Type of Home: House Home Access: Stairs to enter [employer] of Steps: 5 Entrance Stairs-Rails: Right Home Layout: One level Home Equipment: Cane - single point;Shower seat - built in;Walker - 2 wheels Prior Function Level of  Independence: Independent with assistive device(s) Comments: pt reports she ambulated with RW or cane at home; reports she was independent with ADLs but has her daughter with her at all times  Communication Communication: No difficulties Dominant Hand: Left         Vision/Perception  Vision - History Baseline Vision: Wears glasses only for reading Patient Visual Report: No change from baseline Perception Perception: Within Functional Limits   Cognition  Cognition Arousal/Alertness: Awake/alert Behavior During Therapy: Flat affect Overall Cognitive Status: Within Functional Limits for tasks assessed    Extremity/Trunk Assessment Upper Extremity Assessment Upper Extremity Assessment: Overall WFL for tasks assessed;Generalized weakness Lower Extremity Assessment Lower Extremity Assessment: Defer to PT evaluation Cervical / Trunk Assessment Cervical / Trunk Assessment: Normal     Mobility Bed Mobility General bed mobility comments: Pt up in recliner Transfers Overall transfer level: Needs assistance Equipment used: Rolling walker (2 wheeled) Transfers: Sit to/from Stand Sit to Stand: Min assist General transfer comment: Assist to come to full standing as well as for controlled descent to chair.         Balance Balance Overall balance assessment: Needs assistance Sitting-balance support: Feet supported;Bilateral upper extremity supported Sitting balance-Leahy Scale: Good Standing balance support: Bilateral upper extremity supported;Single extremity supported;During functional activity Standing balance-Leahy Scale: Fair   End of Session OT - End of Session Equipment Utilized During Treatment: Gait belt;Rolling walker;Other (comment) (3 in 1) Activity Tolerance: Patient tolerated treatment well Patient left: in chair;with call bell/phone within reach  GO     Britt Bottom 03/06/2013, 2:59 PM

## 2013-03-06 NOTE — Progress Notes (Signed)
Physical Therapy Treatment Patient Details Name: [name] MRN: [id number] DOB: [birth year] Today's Date: 03/06/2013 Time: 6270-3500 PT Time Calculation (min): 23 min  PT Assessment / Plan / Recommendation  History of Present Illness The patient is a 80 yo woman, history of meningiomas, presenting 1/10 with new-onset seizures, with CT showing edema and midline shift surrounding meningioma, s/p resection of meningioma 1/15.   PT Comments   Pt progressing towards physical therapy goals. Able to progress ambulation distance at this time, and states she feels more comfortable using the RW. During gait training, pt reports feeling "swimmy" in her head, which resolved with transfer to supine. Pt very fatigued after this, and was not able to tolerate much therapeutic exercise after.   Follow Up Recommendations  Supervision/Assistance - 24 hour;Home health PT     Does the patient have the potential to tolerate intense rehabilitation     Barriers to Discharge        Equipment Recommendations  None recommended by PT    Recommendations for Other Services OT consult  Frequency Min 3X/week   Progress towards PT Goals Progress towards PT goals: Progressing toward goals  Plan Current plan remains appropriate    Precautions / Restrictions Precautions Precautions: Fall Restrictions Weight Bearing Restrictions: No   Pertinent Vitals/Pain Pt reports 5/10 pain in her head at rest. RN notified.     Mobility  Bed Mobility General bed mobility comments: Pt received up in recliner Transfers Overall transfer level: Needs assistance Equipment used: Rolling walker (2 wheeled) Transfers: Sit to/from Stand Sit to Stand: Min assist General transfer comment: Assist to come to full standing as well as for controlled descent to chair.  Ambulation/Gait Ambulation/Gait assistance: Min assist Ambulation Distance (Feet): 45 Feet Assistive device: 1 person hand held assist Gait Pattern/deviations:  Step-through pattern;Decreased stride length;Narrow base of support Gait velocity: decreased  Gait velocity interpretation: Below normal speed for age/gender General Gait Details: VC's for sequencing and safety with RW. Occasional assist for walker placement, especially during turns.     Exercises General Exercises - Lower Extremity Long Arc Quad: 10 reps   PT Diagnosis:    PT Problem List:   PT Treatment Interventions:     PT Goals (current goals can now be found in the care plan section) Acute Rehab PT Goals Patient Stated Goal: to go home with daughter PT Goal Formulation: With patient Time For Goal Achievement: 03/18/13 Potential to Achieve Goals: Good  Visit Information  Last PT Received On: 03/06/13 Assistance Needed: +1 History of Present Illness: The patient is a 80 yo woman, history of meningiomas, presenting 1/10 with new-onset seizures, with CT showing edema and midline shift surrounding meningioma, s/p resection of meningioma 1/15.    Subjective Data  Subjective: "I am starting to feel swimmy in my head." --during ambulation Patient Stated Goal: to go home with daughter   Cognition  Cognition Arousal/Alertness: Awake/alert Behavior During Therapy: Flat affect Overall Cognitive Status: Within Functional Limits for tasks assessed    Balance  Balance Overall balance assessment: Needs assistance Sitting-balance support: Feet supported;Bilateral upper extremity supported Sitting balance-Leahy Scale: Good Standing balance support: Bilateral upper extremity supported Standing balance-Leahy Scale: Fair  End of Session PT - End of Session Equipment Utilized During Treatment: Gait belt Activity Tolerance: Patient tolerated treatment well Patient left: in chair;with call bell/phone within reach Nurse Communication: Mobility status   GP     Jolyn Lent 03/06/2013, 12:10 PM  Jolyn Lent, [place], DPT [phone]

## 2013-03-06 NOTE — Progress Notes (Signed)
Internal Medicine Attending  Date: 03/06/2013  Patient name: [name] Medical record number: [id number] Date of birth: 1933/08/06 Age: 80 y.o. Gender: female  I saw and evaluated the patient. I reviewed the resident's note by Dr. Naaman Plummer and I agree with the resident's findings and plans as documented in her note.

## 2013-03-06 NOTE — Progress Notes (Signed)
Inpatient Diabetes Program Recommendations  AACE/ADA: New Consensus Statement on Inpatient Glycemic Control (2013)  Target Ranges:  Prepandial:   less than 140 mg/dL      Peak postprandial:   less than 180 mg/dL (1-2 hours)      Critically ill patients:  140 - 180 mg/dL   Hyperglycemia while on high dose steroid therapy:  Inpatient Diabetes Program Recommendations Insulin - Basal: xxx Correction (SSI): . Insulin - Meal Coverage: Please consider addition of meal coverage tidwc while on Decadron 6 mg q 6 hrs.  Thank you, Rosita Kea, RN, CNS, Diabetes Coordinator [phone])

## 2013-03-06 NOTE — Progress Notes (Addendum)
Subjective:  Pt seen and examined. No acute events overnight with no reported seizure activity. She is alert and orientated.  She reports mild bifrontal headache and mild blurry vision with mild clear drainage from left eye. She denies weakness, paraesthesias, syncope, fever, chills, cough, dyspnea, chest pain, abdominal pain, change in BM or urination. She reports ambulating with PT with no difficulty.    Objective: Vital signs in last 24 hours: Filed Vitals:   03/05/13 1616 03/05/13 1700 03/05/13 2054 03/06/13 0544  BP:  153/66 150/66 148/57  Pulse:  74 69 75  Temp: 98.4 F (36.9 C)  98.3 F (36.8 C) 98.7 F (37.1 C)  TempSrc: Oral  Oral Oral  Resp:  19 18 18   Height:      Weight:      SpO2:  97% 100% 100%   Weight change:   Intake/Output Summary (Last 24 hours) at 03/06/13 1139 Last data filed at 03/06/13 0545  Gross per 24 hour  Intake    960 ml  Output      0 ml  Net    960 ml   Vitals reviewed.  Constitutional: She is oriented to person, place, and time and well-developed, well-nourished, and in no distress. Head: Clean, dry and intact dressing Eyes: Conjunctivae and EOM are normal. Mild serous drainage from left eye Cardiovascular: Normal rate, regular rhythm, normal heart sounds. Exam reveals no gallop and no friction rub. No murmur heard.  Pulmonary/Chest: Effort normal and breath sounds normal. No respiratory distress. She has no wheezes. She has no rales. She exhibits no tenderness.  Abdominal: Soft. Bowel sounds are normal. She exhibits no distension. There is no abdominal tenderness. There is no rigidity, no rebound and no guarding.  Neurological: She is oriented to person, place, and time. She has normal sensation and normal strength. She displays facial symmetry. No cranial nerve deficit.  Extremities: no LE edema   Lab Results: Basic Metabolic Panel:  Recent Labs Lab 03/04/13 0250 03/05/13 0340  NA 140 139  K 4.2 4.3  CL 102 103  CO2 23 24    GLUCOSE 271* 285*  BUN 18 22  CREATININE 0.86 0.88  CALCIUM 7.5* 7.7*  MG 1.7 1.8  PHOS 3.9 2.6   CBC:  Recent Labs Lab 03/03/13 0100 03/03/13 0430 03/04/13 0250  WBC 23.7* 23.9* 24.7*  NEUTROABS 18.8*  --   --   HGB 11.0* 10.5* 10.2*  HCT 33.7* 32.7* 32.0*  MCV 78.6 78.4 79.4  PLT 237 230 219   Cardiac Enzymes: No results found for this basename: CKTOTAL, CKMB, CKMBINDEX, TROPONINI,  in the last 168 hours CBG:  Recent Labs Lab 03/04/13 2148 03/05/13 0815 03/05/13 1224 03/05/13 1754 03/05/13 2133 03/06/13 0744  GLUCAP 353* 247* 293* 293* 314* 364*   Coagulation:  Recent Labs Lab 03/02/13 0333  LABPROT 13.9  INR 1.09   Urine Drug Screen: Drugs of Abuse     Component Value Date/Time   LABOPIA NONE DETECTED 02/25/2013 0237   LABOPIA PPS 06/27/2012 1645   COCAINSCRNUR NONE DETECTED 02/25/2013 0237   COCAINSCRNUR NEG 06/27/2012 1645   LABBENZ POSITIVE* 02/25/2013 0237   LABBENZ PPS 06/27/2012 1645   LABBENZ NEG 07/02/2006 2021   AMPHETMU NONE DETECTED 02/25/2013 0237   AMPHETMU NEG 07/02/2006 2021   THCU NONE DETECTED 02/25/2013 0237   LABBARB NONE DETECTED 02/25/2013 0237   LABBARB NEG 06/27/2012 1645    Micro Results: Recent Results (from the past 240 hour(s))  URINE CULTURE  Status: None   Collection Time    02/25/13  2:37 AM      Result Value Range Status   Specimen Description URINE, RANDOM   Final   Special Requests NONE   Final   Culture  Setup Time     Final   Value: 02/25/2013 10:35     Performed at [place]     Final   Value: >=100,000 COLONIES/ML     Performed at [facility]   Culture     Final   Value: Multiple bacterial morphotypes present, none predominant. Suggest appropriate recollection if clinically indicated.     Performed at [facility]   Report Status 02/26/2013 FINAL   Final  MRSA PCR SCREENING     Status: None   Collection Time    02/25/13  6:38 AM      Result Value Range Status    MRSA by PCR NEGATIVE  NEGATIVE Final   Comment:            The GeneXpert MRSA Assay (FDA     approved for NASAL specimens     only), is one component of a     comprehensive MRSA colonization     surveillance program. It is not     intended to diagnose MRSA     infection nor to guide or     monitor treatment for     MRSA infections.   Studies/Results: No results found. Medications: I have reviewed the patient's current medications. Scheduled Meds: . amLODipine  10 mg Oral Daily  . carvedilol  25 mg Oral BID WC  . dexamethasone  6 mg Intravenous Q6H  . furosemide  20 mg Oral Daily  . insulin aspart  0-20 Units Subcutaneous TID WC  . insulin aspart  0-5 Units Subcutaneous QHS  . insulin glargine  25 Units Subcutaneous Daily  . levETIRAcetam  500 mg Oral BID  . lisinopril  5 mg Oral Daily  . pantoprazole  40 mg Oral QHS  . senna  1 tablet Oral BID  . simvastatin  20 mg Oral q1800  . sodium chloride  3 mL Intravenous Q12H   Continuous Infusions: . sodium chloride 20 mL/hr at 03/05/13 1904   PRN Meds:.acetaminophen, acetaminophen, bisacodyl, hydrALAZINE, HYDROcodone-acetaminophen, morphine injection, naLOXone (NARCAN)  injection, promethazine Assessment/Plan:  Cerebral R meningioma with midline shift s/p craniotomy and tumor excision 03/02/13 - POD # 4 with last seizure actibity on 1/10 and possibly on 1/16 -Appreciate neurosurgery recommendations --> pt stable to discharge if ok with family and PT- pt to have close tapering of corticosteroids -Transition Decadron 6mg  IV Q6hr to PO 4 mg BID, neurosurgery to taper  -Continue PO keppra 500 mg BID -Continue simvastatin 20 mg daily  -Ativan PRN recurrent seizures -IV morphine, PO tylenol PRN pain  -Frequent neuro checks -Seizure precautions -Phenergan prn nausea -PT/OT consults --> home health OT & PT with 24hr-assistance  -Per family, will provide 24-hour supervision, and per PT would be ok to discharge home  Uncontrolled Type  I Diabetes Mellitus - CBG 288 in setting of glucocorticosteroids.  Last HbA1c of 12.1 on 12/23/12. Pt at home on 15 U Lantus and 1-5 U BID before lunch and dinner. Pt required glucostabilizer prior to surgery for improved control.  -Appreciate diabetes coordinator recommendations -Frequent CBG monitoring -Yesterday required 20 U Lantus, Novolog 27U with meals and 4 U at night -Continue Lantus 25U   -Continue meal time coverage 5  U TID -Continue resistant sliding scale with meals and at night   Hypertension - currently normotensive  -Continue home furosemide 20 mg daily  -Continue home amlodipine 10 mg daily  -Continue home carvedilol 25 mg BID  -Continue lisinopril 5 mg daily -Hydralazine 5 mg PRN SBP>180/100  Microcytic/Normocytic Anemia - currently stable without active bleeding after surgery.  Pt with last Hg 10.2 on 1/17 with baseline 10-12. Etiology unknown.  -Continue to monitor for bleeding  -Consider anemia panel as outpatient  Hyperlipidemia - Last lipid panel 10/2010 with hypercholesteremia (LDL 147 total 213)  -Continue home simvastatin 20 mg daily   GERD - currently without reflux symptoms. Pt on 20 mg protonix at home.  -Continue protonix 40 mg daily   Hypokalemia - resolved. Most likely due to poor PO intake.  -Replete with PO potassium chloride as needed  -Continue to monitor BMP   Dispo: 1 day Diet: Carb modified DVT: SCDs for now post op  The patient does have a current PCP Jerene Pitch, MD) and does need an [facility] hospital follow-up appointment after discharge.  The patient does not have transportation limitations that hinder transportation to clinic appointments.  Services Needed at time of discharge: Y = Yes, Blank = No PT: Home health, 24 hour supervision  OT:   RN:   Equipment:   Other:     LOS: 9 days   Juluis Mire, MD 03/06/2013, 11:39 AM

## 2013-03-06 NOTE — Care Management Note (Signed)
CARE MANAGEMENT NOTE 03/06/2013  Patient:  [name],[name]   Account Number:  [id number]  Date Initiated:  02/27/2013  Documentation initiated by:  MAYO,HENRIETTA  Subjective/Objective Assessment:   adm with dx of vasogenic brain edema; lives with dtr, has 3-N-1, rolling walker; active with [facility] for PT    PCP  Dr Jerene Pitch     Action/Plan:   CM spoke with patient, left info for daughter to contact Case Manager. Contacted [facility] to resume services.   Anticipated DC Date:  03/07/2013   Anticipated DC Plan:  [place]  CM consult      [facility] Choice  HOME HEALTH  Resumption Of Svcs/PTA Provider   Choice offered to / List presented to:          [facility] arranged  [place] [facility]      [place].   Status of service:  In process, will continue to follow Medicare Important Message given?

## 2013-03-06 NOTE — Progress Notes (Signed)
Patient ID: [name], female   DOB: 05-25-1933, 80 y.o.   MRN: [id number] BP 136/62  Pulse 88  Temp(Src) 98.1 F (36.7 C) (Oral)  Resp 20  Ht 5\' 4"  (1.626 m)  Wt 86.4 kg (190 lb 7.6 oz)  BMI 32.68 kg/m2  SpO2 100% Alert and oriented x 4 Wound is clean, dry, and without signs of infection Perrl, full eom Tongue, and uvula are in the midline Doing well

## 2013-03-07 ENCOUNTER — Encounter (HOSPITAL_COMMUNITY): Payer: Self-pay | Admitting: Neurosurgery

## 2013-03-07 LAB — GLUCOSE, CAPILLARY
GLUCOSE-CAPILLARY: 207 mg/dL — AB (ref 70–99)
Glucose-Capillary: 266 mg/dL — ABNORMAL HIGH (ref 70–99)

## 2013-03-07 LAB — CBC
HEMATOCRIT: 27.5 % — AB (ref 36.0–46.0)
Hemoglobin: 9 g/dL — ABNORMAL LOW (ref 12.0–15.0)
MCH: 25.5 pg — ABNORMAL LOW (ref 26.0–34.0)
MCHC: 32.7 g/dL (ref 30.0–36.0)
MCV: 77.9 fL — AB (ref 78.0–100.0)
PLATELETS: 245 10*3/uL (ref 150–400)
RBC: 3.53 MIL/uL — ABNORMAL LOW (ref 3.87–5.11)
RDW: 17 % — ABNORMAL HIGH (ref 11.5–15.5)
WBC: 15.2 10*3/uL — ABNORMAL HIGH (ref 4.0–10.5)

## 2013-03-07 MED ORDER — INSULIN ASPART 100 UNIT/ML FLEXPEN: PEN_INJECTOR | SUBCUTANEOUS | Status: DC

## 2013-03-07 MED ORDER — INSULIN GLARGINE 100 UNIT/ML SOLOSTAR PEN: 25.0000 [IU] | PEN_INJECTOR | Freq: Every day | SUBCUTANEOUS | Status: DC

## 2013-03-07 MED ORDER — DEXAMETHASONE 4 MG PO TABS: ORAL_TABLET | ORAL | Status: DC

## 2013-03-07 MED ORDER — LEVETIRACETAM 500 MG PO TABS: 500.0000 mg | ORAL_TABLET | Freq: Two times a day (BID) | ORAL | Status: DC

## 2013-03-07 MED ORDER — INSULIN ASPART 100 UNIT/ML ~~LOC~~ SOLN: 15.0000 [IU] | Freq: Three times a day (TID) | SUBCUTANEOUS | Status: DC

## 2013-03-07 NOTE — Progress Notes (Signed)
Inpatient Diabetes Program Recommendations  AACE/ADA: New Consensus Statement on Inpatient Glycemic Control (2013)  Target Ranges:  Prepandial:   less than 140 mg/dL      Peak postprandial:   less than 180 mg/dL (1-2 hours)      Critically ill patients:  140 - 180 mg/dL  Results for DENNYS, TRAUGHBER (MRN [id number]) as of 03/07/2013 11:10  Ref. Range 03/06/2013 07:44 03/06/2013 11:47 03/06/2013 16:11 03/06/2013 21:24 03/07/2013 06:32  Glucose-Capillary Latest Range: 70-99 mg/dL 364 (H) 288 (H) 302 (H) 325 (H) 207 (H)   Inpatient Diabetes Program Recommendations Insulin - Basal: Increase Lantus to 30 units  Correction (SSI): . Insulin - Meal Coverage: noted addition of prandial dose TID Thank you  Raoul Pitch BSN, RN,CDE Inpatient Diabetes Coordinator [phone] (team pager)

## 2013-03-07 NOTE — Progress Notes (Signed)
Occupational Therapy Treatment Patient Details Name: Laporcha Marchesi MRN: [id number] DOB: 1933-07-26 Today's Date: 03/07/2013 Time: 0263-7858 OT Time Calculation (min): 9 min  OT Assessment / Plan / Recommendation  History of present illness The patient is a 80 yo woman, history of meningiomas, presenting 1/10 with new-onset seizures, with CT showing edema and midline shift surrounding meningioma, s/p resection of meningioma 1/15.   OT comments  Pt. Up amb. In hall wit pt upon arrival.  Rosezena Sensor she is tired but agreeable to ot.  Sim. All aspects of toileting with min a/min guard a.  Lb dressing (socks) min a seated  Follow Up Recommendations  Home health OT;Supervision/Assistance - 24 hour                      Frequency Min 2X/week   Progress towards OT Goals Progress towards OT goals: Progressing toward goals  Plan Discharge plan remains appropriate    Precautions / Restrictions Precautions Precautions: Fall   Pertinent Vitals/Pain No complaints    ADL  Lower Body Dressing: Performed;Minimal assistance Where Assessed - Lower Body Dressing: Supported sitting Toilet Transfer: Simulated;Minimal assistance Toilet Transfer Method: Stand pivot Toileting - Clothing Manipulation and Hygiene: Simulated;Minimal assistance Where Assessed - Toileting Clothing Manipulation and Hygiene: Standing Transfers/Ambulation Related to ADLs: cues for controlled descend to chair with max cues for reaching for arm rests prior to sitting down ADL Comments: sim. toileting with amb. in room to recliner.  min a all aspects, min a lb dressing don/doff socks seated in recliner    OT Goals(current goals can now be found in the care plan section)    Visit Information  Last OT Received On: 03/07/13 History of Present Illness: The patient is a 80 yo woman, history of meningiomas, presenting 1/10 with new-onset seizures, with CT showing edema and midline shift surrounding meningioma, s/p resection of  meningioma 1/15.                 Cognition  Cognition Arousal/Alertness: Awake/alert Behavior During Therapy: Flat affect Overall Cognitive Status: Within Functional Limits for tasks assessed    Mobility  Bed Mobility General bed mobility comments: oob upon arrival, pt. was amb. back from gym with p.t.a.  Transfers Overall transfer level: Needs assistance Equipment used: Rolling walker (2 wheeled) Transfers: Sit to/from [facility] Sit to Stand: Min assist Stand pivot transfers: Min assist General transfer comment: cues for hand placement on arm rests and controlling descend to chair             End of Session OT - End of Session Equipment Utilized During Treatment: Rolling walker Activity Tolerance: Patient tolerated treatment well Patient left: in chair;with call bell/phone within reach;with nursing/sitter in room       Janice Coffin, COTA/L 03/07/2013, 11:33 AM

## 2013-03-07 NOTE — Discharge Instructions (Addendum)
Take Keppra 500 mg twice a day including 1 dose tonight at 10:00PM Take Decadron 4 mg twice a day for 5 days beginning 1/21 (take 1 dose tonight at 10:00PM) then take once a day for 5 days then STOP Take 25 U Lantus nightly and insulin sliding scale with meals as [name] explained -Please attend your follow-up appointments     Craniotomy Care After Please read the instructions outlined below and refer to this sheet in the next few weeks. These discharge instructions provide you with general information on caring for yourself after you leave the hospital. Your surgeon may also give you specific instructions. While your treatment has been planned according to the most current medical practices available, unavoidable complications occasionally occur. If you have any problems or questions after discharge, please call your surgeon. Although there are many types of brain surgery, recovery following craniotomy (surgical opening of the skull) is much the same for each. However, recovery depends on many factors. These include the type and severity of brain injury and the type of surgery. It also depends on any nervous system function problems (neurological deficits) before surgery. If the craniotomy was done for cancer, chemotherapy and radiation could follow. You could be in the hospital from 5 days to a couple weeks. This depends on the type of surgery, findings, and whether there are complications. HOME CARE INSTRUCTIONS   It is not unusual to hear a clicking noise after a craniotomy, the plates and screws used to attach the bone flap can sometimes cause this. It is a normal occurrence if this does happen  Do not drive for 10 days after the operation  Your scalp may feel spongy for a while, because of fluid under it. This will gradually get better. Occasionally, the surgeon will not replace the bone that was removed to access the brain. If there is a bony defect, the surgeon will ask you to wear a helmet  for protection. This is a discussion you should have with your surgeon prior to leaving the hospital (discharge).  Numbness may persist in some areas of your scalp.  Take all medications as directed. Sometimes steroids to control swelling are prescribed. Anticonvulsants to prevent seizures may also be given. Do not use alcohol, other drugs, or medications unless your surgeon says it is OK.  Keep the wound dry and clean. The wound may be washed gently with soap and water. Then, you may gently blot or dab it dry, without rubbing. Do not take baths, use swimming pools or hot tubs for 10 days, or as instructed by your caregiver. It is best to wait to see you surgeon at your first postoperative visit, and to get directions at that time.  Only take over-the-counter or prescription medicines for pain, discomfort, or fever as directed by your caregiver.  You may continue your normal diet, as directed.  Walking is OK for exercise. Wait at least 3 months before you return to mild, non-contact sports or as your surgeon suggests. Contact sports should be avoided for at least 1 year, unless your surgeon says it is OK.  If you are prescribed steroids, take them exactly as prescribed. If you start having a decrease in nervous system functions (neurological deficits) and headaches as the dose of steroids is reduced, tell your surgeon right away.  When the anticonvulsant prescription is finished you no longer need to take it. SEEK IMMEDIATE MEDICAL CARE IF:   You develop nausea, vomiting, severe headaches, confusion, or you have a seizure.  You develop chest pain, a stiff neck, or difficulty breathing.  There is redness, swelling, or increasing pain in the wound or pin insertion sites.  You have an increase in swelling or bruising around the eyes.  There is drainage or pus coming from the wound.  You have an oral temperature above 102 F (38.9 C), not controlled by medicine.  You notice a foul smell  coming from the wound or dressing.  The wound breaks open (edges not staying together) after the stitches have been removed.  You develop dizziness or fainting while standing.  You develop a rash.  You develop any reaction or side effects to the medications given. Document Released: 05/05/2005 Document Revised: 04/27/2011 Document Reviewed: 02/11/2009 [facility] Patient Information 2013 [place].

## 2013-03-07 NOTE — Progress Notes (Addendum)
Subjective:  Pt seen and examined. No acute events overnight with no reported seizure activity. She is alert and orientated. She reports improve bifrontal headache and blurry vision. She denies weakness, paraesthesias, syncope, fever, chills, cough, dyspnea, chest pain, abdominal pain, change in BM or urination. She reports ambulating with PT with no difficulty.    Objective: Vital signs in last 24 hours: Filed Vitals:   03/06/13 0544 03/06/13 1300 03/06/13 2114 03/07/13 0457  BP: 148/57 136/62 145/77 170/71  Pulse: 75 88 65 64  Temp: 98.7 F (37.1 C) 98.1 F (36.7 C) 97.6 F (36.4 C) 98.1 F (36.7 C)  TempSrc: Oral Oral    Resp: 18 20 18 16   Height:      Weight:      SpO2: 100% 100% 100% 100%   Weight change:   Intake/Output Summary (Last 24 hours) at 03/07/13 1153 Last data filed at 03/07/13 0630  Gross per 24 hour  Intake    570 ml  Output      0 ml  Net    570 ml   Vitals reviewed.  Constitutional: She is oriented to person, place, and time and well-developed, well-nourished, and in no distress. Head: well healing incision with no drainage, erythema, or swelling  Eyes: Conjunctivae and EOM are normal. Mild serous drainage from left eye Cardiovascular: Normal rate, regular rhythm, normal heart sounds. Exam reveals no gallop and no friction rub. No murmur heard.  Pulmonary/Chest: Effort normal and breath sounds normal. No respiratory distress. She has no wheezes. She has no rales. She exhibits no tenderness.  Abdominal: Soft. Bowel sounds are normal. She exhibits no distension. There is no abdominal tenderness. There is no rigidity, no rebound and no guarding.  Neurological: She is oriented to person, place, and time. She has normal sensation and normal strength. She displays facial symmetry. No cranial nerve deficit.  Extremities: no LE edema   Lab Results: Basic Metabolic Panel:  Recent Labs Lab 03/04/13 0250 03/05/13 0340  NA 140 139  K 4.2 4.3  CL  102 103  CO2 23 24  GLUCOSE 271* 285*  BUN 18 22  CREATININE 0.86 0.88  CALCIUM 7.5* 7.7*  MG 1.7 1.8  PHOS 3.9 2.6   CBC:  Recent Labs Lab 03/03/13 0100  03/04/13 0250 03/07/13 0504  WBC 23.7*  < > 24.7* 15.2*  NEUTROABS 18.8*  --   --   --   HGB 11.0*  < > 10.2* 9.0*  HCT 33.7*  < > 32.0* 27.5*  MCV 78.6  < > 79.4 77.9*  PLT 237  < > 219 245  < > = values in this interval not displayed. Cardiac Enzymes: No results found for this basename: CKTOTAL, CKMB, CKMBINDEX, TROPONINI,  in the last 168 hours CBG:  Recent Labs Lab 03/05/13 2133 03/06/13 0744 03/06/13 1147 03/06/13 1611 03/06/13 2124 03/07/13 0632  GLUCAP 314* 364* 288* 302* 325* 207*   Coagulation:  Recent Labs Lab 03/02/13 0333  LABPROT 13.9  INR 1.09   Urine Drug Screen: Drugs of Abuse     Component Value Date/Time   LABOPIA NONE DETECTED 02/25/2013 0237   LABOPIA PPS 06/27/2012 1645   COCAINSCRNUR NONE DETECTED 02/25/2013 0237   COCAINSCRNUR NEG 06/27/2012 1645   LABBENZ POSITIVE* 02/25/2013 0237   LABBENZ PPS 06/27/2012 1645   LABBENZ NEG 07/02/2006 2021   AMPHETMU NONE DETECTED 02/25/2013 0237   AMPHETMU NEG 07/02/2006 [place] DETECTED 02/25/2013 0237  LABBARB NONE DETECTED 02/25/2013 0237   LABBARB NEG 06/27/2012 1645    Micro Results: No results found for this or any previous visit (from the past 240 hour(s)). Studies/Results: No results found. Medications: I have reviewed the patient's current medications. Scheduled Meds: . amLODipine  10 mg Oral Daily  . carvedilol  25 mg Oral BID WC  . dexamethasone  4 mg Oral Q12H  . furosemide  20 mg Oral Daily  . insulin aspart  0-20 Units Subcutaneous TID WC  . insulin aspart  0-5 Units Subcutaneous QHS  . insulin aspart  5 Units Subcutaneous TID WC  . insulin glargine  25 Units Subcutaneous Daily  . levETIRAcetam  500 mg Oral BID  . lisinopril  5 mg Oral Daily  . pantoprazole  40 mg Oral QHS  . polyethylene glycol  17 g Oral Once  .  senna  1 tablet Oral BID  . simvastatin  20 mg Oral q1800  . sodium chloride  3 mL Intravenous Q12H   Continuous Infusions: . sodium chloride 20 mL/hr at 03/05/13 1904   PRN Meds:.acetaminophen, acetaminophen, bisacodyl, hydrALAZINE, HYDROcodone-acetaminophen, morphine injection, naLOXone (NARCAN)  injection, promethazine Assessment/Plan:  Cerebral R meningioma with midline shift s/p craniotomy and tumor excision 03/02/13 - POD # 5 with last seizure activity on 1/10 and possibly on 1/16 -Appreciate neurosurgery recommendations --> pt stable for discharge today -Continue PO decadron 4 mg BID, per neurosurgery take for 5 days then daily for 5 days then STOP -Continue PO keppra 500 mg BID and on discharge indefinitely  -Continue simvastatin 20 mg daily  -Ativan PRN recurrent seizures -IV morphine, PO tylenol PRN pain  -Frequent neuro checks -Seizure precautions -Phenergan prn nausea -PT/OT consults --> home health OT & PT with 24hr-assistance  -Per family, will provide 24-hour supervision, and per PT would be ok to discharge home -Appt with Dr. Christella Noa for suture removal in 1 week   Uncontrolled Type I Diabetes Mellitus - CBG 207 in setting of glucocorticosteroids.  Last HbA1c of 12.1 on 12/23/12. Pt at home on 15 U Lantus and 1-5 U BID before lunch and dinner. Pt required glucostabilizer prior to surgery for improved control.  -Yesterday required 25 U Lantus, Novolog 46U with meals and 5 U at night -Continue Lantus 25U and ISS on discharge per Butch Penny Plyler   Hypertension - currently hypertensive  -Continue home furosemide 20 mg daily and at disharge -Continue home amlodipine 10 mg daily and at discharge -Continue home carvedilol 25 mg BID and at disharge -Continue lisinopril 5 mg daily and at discharge  Microcytic/Normocytic Anemia - Hg 9.0 currently stable without active bleeding after surgery.  Pt with last Hg 10.2 on 1/17 with baseline 10-12. Etiology unknown.  -Continue to monitor  for bleeding  -Consider anemia panel as outpatient -Per neurosurgery, ok to continue aspirin at home  Hyperlipidemia - Last lipid panel 10/2010 with hypercholesteremia (LDL 147 total 213)  -Continue home simvastatin 20 mg daily   GERD - currently without reflux symptoms. Pt on 20 mg protonix at home.  -Continue protonix 40 mg daily   Hypokalemia - resolved. Most likely due to poor PO intake.  -Replete with PO potassium chloride as needed  -Continue to monitor BMP   Dispo: today Diet: Carb modified DVT: SCDs for now post op  The patient does have a current PCP Jerene Pitch, MD) and does need an [facility] hospital follow-up appointment after discharge.  The patient does not have transportation limitations that hinder transportation to clinic  appointments.  Services Needed at time of discharge: Y = Yes, Blank = No PT: Home health, 24 hour supervision  OT:   RN:   Equipment:   Other:     LOS: 10 days   Juluis Mire, MD 03/07/2013, 11:53 AM

## 2013-03-07 NOTE — Progress Notes (Signed)
Visit to patient while in hospital. Will call after patient is discharged to assist with transition of care. Missed meeting with daughter to review insulin administration per resident team's request. However, nurse reports daughter demonstrated use of insulin pen for her and did fine except had trouble pushing injection button all the way in. Recommend new Novolog Flextouch pen. Discussed meal intake with patient and encouraged her to substitute pudding or yogurt or ensure instead of skipping meals when she doesn't have an appetite.

## 2013-03-07 NOTE — Progress Notes (Signed)
NUTRITION FOLLOW UP  Intervention:   Continue Multivitamin with minerals daily Encourage intake Follow up with Outpatient RD after d/c  Nutrition Dx:   Predicted suboptimal energy intake related to weight loss history and schedule surgery as evidenced by pt's chart; ongoing, continued weight loss  Goal:   Pt to meet >/= 90% of their estimated nutrition needs; likely being met  Monitor:   PO intake; 75-100% meal completion per nursing notes Weight trends; trending down, additional 3 lb wt loss since admission Labs; blood glucose ranging 122 to 226 mg/dL, low hemoglobin, elevated BUN, decreased GFR  Assessment:   80yo woman with PMH of DM2, HLD, HTN, GERD, CVA, Meningioma who presents to the ED with new onset of seizures. CT scan of her head revealed an increased size of right frontal meningioma, vasogenic edema and right to left shift.   Spoke with pt and family. Per pt she consumed 100% of her lunch today. Possibly discharging today. Encouraged intake at meals.   Height: Ht Readings from Last 1 Encounters:  02/28/13 5' 4" (1.626 m)    Weight Status:   Wt Readings from Last 1 Encounters:  02/28/13 190 lb 7.6 oz (86.4 kg)  Admission weight 193 lb (87.6 kg) Weight range this admission 189-193  Re-estimated needs:  Kcal: 1600-1850  Protein: 90-100 grams  Fluid: > 1.8 L/day  Skin: head incision   Diet Order: Carb Control Meal Completion: 100%   Intake/Output Summary (Last 24 hours) at 03/07/13 1405 Last data filed at 03/07/13 0630  Gross per 24 hour  Intake    470 ml  Output      0 ml  Net    470 ml    Last BM: 1/20   Labs:   Recent Labs Lab 03/03/13 0430 03/04/13 0250 03/05/13 0340  NA 142 140 139  K 4.1 4.2 4.3  CL 107 102 103  CO2 _0 BUN _1 CREATININE 0.86 0.86 0.88  CALCIUM 7.4* 7.5* 7.7*  MG 1.7 1.7 1.8  PHOS 3.6 3.9 2.6  GLUCOSE 123* 271* 285*    CBG (last 3)   Recent Labs  03/06/13 2124 03/07/13 0632 03/07/13 1120   GLUCAP 325* 207* 266*    Scheduled Meds: . amLODipine  10 mg Oral Daily  . carvedilol  25 mg Oral BID WC  . dexamethasone  4 mg Oral Q12H  . furosemide  20 mg Oral Daily  . insulin aspart  0-20 Units Subcutaneous TID WC  . insulin aspart  0-5 Units Subcutaneous QHS  . insulin aspart  5 Units Subcutaneous TID WC  . insulin glargine  25 Units Subcutaneous Daily  . levETIRAcetam  500 mg Oral BID  . lisinopril  5 mg Oral Daily  . pantoprazole  40 mg Oral QHS  . polyethylene glycol  17 g Oral Once  . senna  1 tablet Oral BID  . simvastatin  20 mg Oral q1800  . sodium chloride  3 mL Intravenous Q12H    Continuous Infusions: . sodium chloride 20 mL/hr at 03/05/13 Sorrel, N[place], Odin Pager [phone] After Hours Pager

## 2013-03-07 NOTE — Discharge Summary (Signed)
Name: [name] MRN: [id number] DOB: 10/26/1933 80 y.o. PCP: Jerene Pitch, MD  Date of Admission: 02/25/2013  1:41 AM Date of Discharge: 03/07/2013 Attending Physician: Axel Filler, MD  Discharge Diagnosis:  Primary: New-onset seizures due to 7cm Cerebral Right Atypical Meningioma Grade II requiring craniotomy tumor excision   Uncontrolled Type I Diabetes Mellitus  Hypertension  Microcytic/Normocytic Anemia  Hyperlipidemia  GERD  Hypokalemia   Asymptomatic UTI  Discharge Medications:   Medication List    STOP taking these medications       insulin aspart 100 UNIT/ML injection  Commonly known as:  NOVOLOG  Replaced by:  insulin aspart 100 UNIT/ML FlexPen     triamcinolone ointment 0.1 %  Commonly known as:  KENALOG      TAKE these medications       ACCU-CHEK AVIVA PLUS W/DEVICE Kit  1 each by Does not apply route 3 (three) times daily. dx code 250.00 insulin requiring     accu-chek softclix lancets  Check blood sugar 3 times a day before meals dx code 250.00 insulin requiring     albuterol (2.5 MG/3ML) 0.083% nebulizer solution  Commonly known as:  PROVENTIL  Take 3 mLs (2.5 mg total) by nebulization every 6 (six) hours as needed for wheezing.     albuterol 108 (90 BASE) MCG/ACT inhaler  Commonly known as:  PROVENTIL HFA;VENTOLIN HFA  Inhale 2 puffs into the lungs every 4 (four) hours as needed for wheezing.     amLODipine 10 MG tablet  Commonly known as:  NORVASC  Take 1 tablet (10 mg total) by mouth daily.     aspirin 81 MG tablet  Take 81 mg by mouth daily.     carvedilol 25 MG tablet  Commonly known as:  COREG  Take 1 tablet (25 mg total) by mouth 2 (two) times daily with a meal.     [employer] LANCETS MICRO THIN 33G Misc  Check blood sugar 3 times a day before meals dx code 250.00 insulin requiring     dexamethasone 4 MG tablet  Commonly known as:  DECADRON  - Take 4 mg twice a day for 5 days starting tomorrow. Take one dose tonight 1/20 at  10:00PM   - Then take 4 mg once a day for 5 days then STOP     furosemide 20 MG tablet  Commonly known as:  LASIX  Take 1 tablet (20 mg total) by mouth daily.     glucose blood test strip  Commonly known as:  ACCU-CHEK AVIVA PLUS  Check blood sugar 3 times a day before meals dx code 250.00 insulin requiring     HYDROcodone-acetaminophen 5-325 MG per tablet  Commonly known as:  NORCO/VICODIN  Take 1 tablet by mouth 2 (two) times daily as needed for moderate pain. Do not fill before 04/16/2013  Start taking on:  04/16/2013     insulin aspart 100 UNIT/ML FlexPen  Commonly known as:  NOVOLOG FLEXPEN  - Take three times with meals   - <70    2 units if going to eat- skip if going to skip meal   -  71-140    4 units Novolog   -  141-210  6 units Novolog   -  211-280  8 units     Insulin Glargine 100 UNIT/ML Solostar Pen  Commonly known as:  LANTUS SOLOSTAR  Inject 25 Units into the skin at bedtime.     levETIRAcetam 500 MG tablet  Commonly known as:  KEPPRA  Take 1 tablet (500 mg total) by mouth 2 (two) times daily.     Loratadine 10 MG Caps  Take 1 capsule (10 mg total) by mouth daily.     pantoprazole 20 MG tablet  Commonly known as:  PROTONIX  Take 20 mg by mouth daily.     simvastatin 20 MG tablet  Commonly known as:  ZOCOR  Take 20 mg by mouth daily.        Disposition and follow-up:   Ms.Lillyen Matura was discharged from [facility] in Good condition.  At the hospital follow up visit please address:  1.  -Uncontrolled Diabetes Mellitus in setting of glucocorticosteroids       -Instructed to take 25 U Lantus nightly and Novolog ac meals as follows per Butch Penny Plyler:          <70 2 units if going to eat- skip if going to skip meal         71-140 4 units Novolog        141-210 6 units Novolog         211-280 8 units         281- 350 10 units         351- 420 12 units         421-490 14 units          > 491 give 16 units and call office or  resident on call                -Compliance with decadron - per Dr. Christella Noa to take decadron 4 mg BID for 5 days then daily for 5 days    then STOP       -Seizures and compliance with Keppra 500 mg BID         - If home health PT and OT came to her home, 24-hr family supervision           -Wound healing - pt to have suture removal at neurosurgery appointment next week         -If normocytic anemia stable after surgery       -Hypertension and if controlled     2.  Labs / imaging needed at time of follow-up: CBG, CBC ( H/H), consider anemia panel (etiology anemia unknown)  3.  Pending labs/ test needing follow-up: none   Follow-up Appointments:     Follow-up Information   Follow up with Michail Jewels, MD On 03/10/2013. (2:45 PM)    Specialty:  Internal Medicine   Contact information:   [place] [address] [phone]       Follow up with Plyler, Butch Penny, [place] On 03/07/2013. (1:30 PM)    Specialty:  [facility] information:   [place] [address] [phone]       Follow up with Winfield Cunas, MD On 03/13/2013. (2:45 PM for suture removal)    Specialty:  Neurosurgery   Contact information:   [address], [address]                         [employer] [place] [address] [phone]       Discharge Instructions: Discharge Orders   Future Appointments Provider Department Dept Phone   03/10/2013 1:30 PM Dod[place], [facility] [phone]   03/10/2013 2:45 PM Michail Jewels, MD [facility] [phone]   03/28/2013 3:45 PM Jerene Pitch, [place] [phone]   04/03/2013 2:30 PM Zenovia Jarred, MD [facility] [place] [phone]   Future Orders Complete By Expires   Increase activity slowly  As directed       Consultations: Treatment Team:  Winfield Cunas, MD  Procedures Performed:  Ct Head Wo Contrast   (if New Onset  Seizure And/or Head Trauma)  02/25/2013   CLINICAL DATA:  Possible seizure, confusion, altered mental status  EXAM: CT HEAD WITHOUT CONTRAST  TECHNIQUE: Contiguous axial images were obtained from the base of the skull through the vertex without intravenous contrast.  COMPARISON:  Prior MRI from 11/13/2010 and CT from 07/08/2010.  FINDINGS: There is abnormal cortical thickening with loss of sulcation within the anterior and medial right frontal lobe in region of previously identified meningioma. This lesion appears markedly increased in size, although specific measurements are hard to obtain on is noncontrast head CT. A few scattered calcifications are seen within this region, as can be seen with meningiomas. There is increased vasogenic edema within the right frontal lobe with secondary partial effacement of the anterior horn of the right lateral ventricle and 8 mm of right-to-left midline shift now seen at the anterior falx. No hydrocephalus.  No intracranial hemorrhage. No large vessel territory infarct. Remote lacunar infarct within the right basal ganglia again noted. Additional remote right frontal infarct also noted. Prominent atherosclerotic calcifications noted within the cavernous segments of the internal carotid arteries bilaterally.  No extra-axial fluid collection.  Calvarium is intact.  Orbits are normal.  Paranasal sinuses and mastoid air cells are clear.  IMPRESSION: 1. Interval increase in size of right frontal meningioma with increased vasogenic edema and new 8 mm of right-to-left midline shift. Further evaluation with contrast-enhanced MRI is recommended. 2. No acute intracranial hemorrhage or infarct. 3. Remote right frontal and lacunar infarct within the right basal ganglia, unchanged.  Critical Value/emergent results were called by telephone at the time of interpretation on 02/25/2013 at 3:22 AM to Dr. Delora Fuel , who verbally acknowledged these results.   Electronically Signed   By: Jeannine Boga M.D.   On: 02/25/2013 03:30   Mr Jeri Cos CV Contrast  02/25/2013   CLINICAL DATA:  New onset seizure. History of right frontal meningioma.  EXAM: MRI HEAD WITHOUT AND WITH CONTRAST  TECHNIQUE: Multiplanar, multiecho pulse sequences of the brain and surrounding structures were obtained without and with intravenous contrast.  CONTRAST:  11m MULTIHANCE GADOBENATE DIMEGLUMINE 529 MG/ML IV SOLN  COMPARISON:  Head CT same day.  MRI 11/13/2010.  FINDINGS: Since the study of 2012, there has been considerable growth of the extra-axial enhancing mass on the anterior aspect of the anterior cranial fossa. Measurements are now 8 cm cephalocaudal, 5.9 cm right to left and 3.9 cm front to back maximally. This exerts considerable mass effect upon the anterior aspect of the right hemisphere and there is associated vasogenic edema on the right. There is mass effect upon the ventricular system with distortion. There is right to left shift anteriorly of up to 12 mm. There is a pattern of enhancement throughout the adjacent right frontal brain which looks like enhancement along the penetrating vasculature. This could represent direct invasion by tumor. I can not demonstrate flow within the anterior aspect of the superior sagittal sinus and presumably there is invasion of that structure in that region. Posterior to the region of tumor, the sagittal sinus shows  flow.  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 No acute brain infarction is seen.  There is a 2nd small focus of dural enhancement measuring 6 mm along the tentorium on the right, consistent with a small focus of meningioma. This is unchanged from the previous exam.  No evidence of inflammatory sinus disease. Major arterial structures at the base of the brain show flow. No pituitary lesion.  IMPRESSION: Marked enlargement of the extra-axial mass in the anterior aspect of the anterior cranial fossae on the right consistent with meningioma. Measurements  today are 8 x 5.9 x 3.9 cm. Considerable mass effect upon the brain with vasogenic edema in the right frontal region. Right-to-left shift of 12 mm. Penetrating type enhancement within the right frontal lobe raises concern for direct invasion of brain.  Seconds small meningioma of the tentorium on the right measuring 6 mm, unchanged since 2012.   Electronically Signed   By: Nelson Chimes M.D.   On: 02/25/2013 10:39   Dg Chest Port 1 View  03/02/2013   CLINICAL DATA:  Status post craniotomy.  Central line.  EXAM: PORTABLE CHEST - 1 VIEW  COMPARISON:  Single view of the chest 02/25/2013.  FINDINGS: A right IJ approach central venous catheter is in place with the tip projecting over the right atrium. There is no pneumothorax. Lung volumes are low with some basilar atelectasis. Heart size is mildly enlarged. No pleural effusion.  IMPRESSION: Right IJ catheter tip projects over the right atrium. Recommend withdrawal of 4-5 cm. No pneumothorax.  Cardiomegaly without edema.   Electronically Signed   By: Inge Rise M.D.   On: 03/02/2013 18:06   Dg Chest Port 1 View  02/25/2013   CLINICAL DATA:  Aspiration  EXAM: PORTABLE CHEST - 1 VIEW  COMPARISON:  05/06/2012  FINDINGS: Cardiac shadow is stable. The lungs are well aerated bilaterally. No focal infiltrate or sizable effusion is seen. Postsurgical changes are again noted on the right with some volume loss.  IMPRESSION: Chronic changes without acute abnormality.   Electronically Signed   By: Inez Catalina M.D.   On: 02/25/2013 14:21   Mr Mrv Head Wo Cm  02/26/2013   CLINICAL DATA:  Enlarging meningioma on the right.  EXAM: MR MRV HEAD WITHOUT CONTRAST  TECHNIQUE: Multiplanar, multisequence MR imaging was performed. No intravenous contrast was administered.  COMPARISON:  02/25/2013  FINDINGS: The MR venogram confirms the absence of flow within the anterior 1/3 of the superior sagittal sinus consistent with compression and or invasion by the large anterior cranial  fossa extra-axial tumor on the right. Posterior to that region, the sinus shows normal flow. Both transverse and sigmoid sinuses are patent. Both jugular veins are patent. Deep veins and straight sinus are patent.  IMPRESSION: No flow in the anterior 1/3 of the superior sagittal sinus consistent with direct invasion and or compression by the large right anterior cranial fossa extra-axial mass.   Electronically Signed   By: Nelson Chimes M.D.   On: 02/26/2013 12:02    2D Echo: none  Cardiac Cath: none  Admission HPI:  Original Author Michail Jewels, MD  Pt is a 80 y.o. female who has a past medical history of Diabetes mellitus (2007); Hyperlipidemia; Hypertension; GERD (gastroesophageal reflux disease); CVA (cerebrovascular accident) (2006 ); Diverticulitis; CVA (cerebral infarction) (7-yrs ago); Arthritis; Dysphagia; CHF (congestive heart failure); DIABETES MELLITUS, TYPE II (12/04/2005); HYPERLIPIDEMIA (12/04/2005); HYPERTENSION (12/04/2005); GERD (12/04/2005); ARTHRITIS, KNEE (03/24/2006); Lung mass (07/08/2010); Meningioma (07/21/2010); Hemangioma of liver (12/02/2010);  Depression (12/02/2010); and Adenocarcinoma of lung.Marland Kitchen Pt presents to the ED with new onset seizures. Pt was very drowsy and no family members were present therefore history is limited. According to ED notes, she began having episodes yesterday which consists of turning her head to the left with generalized shaking, and makes a buzzing sound. These episodes lasts a couple of minutes but thereafter, she will be poorly responsive for 5 minutes. She had 2 episodes yesterday and approximately 8 episodes today. There was no loss of bowel or bladder function and no tongue biting. Her family brought in videos of 2 of these episodes. EMS reported a focal seizure and treated her with midazolam. She reports associated HA, changes in vision, hearing loss, and increased sleepiness. She also reports occasional N/V especially when she takes her BP meds which  has been more chronic. She denies any CP or SOB. She denies any urinary symptoms.     Hospital Course by problem list:  New-onset seizures due to 7cm Cerebral Right Atypical Meningioma Grade II requiring craniotomy tumor excision  - Pt presented with new-onset seizures due to enlarging meningioma present since 2778 complicated by mass effect (right to left shift 12 mm) with vasogenic edema, direct invasion of the right frontal lobe, and no anterior 1/3 superior sagittal venous flow. Pt underwent right frontal extra axial and extra foraminal craniotomy and tumor excision of 7 cm atypical meningioma Grade II without complications on 2/42/35 by Dr. Christella Noa. Pt with seizure activity on 1/10 and possibly on 1/16. Pt was continued on keppra and decadron during hospitalization . Per neurosurgery, pt instructed to continue PO decadron 4 mg BID for 5 days then daily for 5 days on discharge and then STOP. Pt also to continue PO keppra 500 mg BID indefinitely. Pt to have home heath OT & PT with 24hr-assistance from family. Pt to have suture removal in 1 week with Dr. Christella Noa.    Uncontrolled Type I Diabetes Mellitus - Pt with last HbA1c of 12.1 on 12/23/12. Pt was at home on 15 U Lantus and 1-5 U BID before lunch and dinner. Pt with glucosuria (>1000) on UA. Pt with AG 16 on admission that resolved during hospitalization.  Pt was on glucocorticosteroid therapy during hospitalization  with CBG values of 97-508. Pt required glucostabilizer prior to surgery for improved control. Pt received varying amount of Lantus with resistant insulin sliding scale and mealtime coverage during hospitalization. Pt was instructed to continue Lantus 25U nightly and ISS as indicated on discharge per Debera Lat with close hospital follow-up for further adjustments.    Hypertension - Pt with blood pressure range of 125/55 to 190/69 during hospitalization. Pt was continued on home  furosemide 20 mg, amlodipine 10 mg, carvedilol 25 mg BID,  and lisinopril 5 mg daily during hospitalization and instructed to continue at discharge.   Microcytic/Normocytic Anemia - Pt with stable Hg (10.2-11.2) without active bleeding or hemodynamic instability during hospitalization. Pt did not require blood transfusion during hospitalization. Pt with baseline Hg of 10-12. Etiology unknown. Last anemia panel done on 03/2010 revealed was normal (Fe 55, TIBC 223, Ferritin 112). Per neurosurgery, pt to continue home aspirin after surgery.   Leukocytosis - Etiology most likely due to glucocorticoids as leukocytosis with neutrophil predominance began after initiation of therapy and no source of infection was identified during hospitalization.     Hyperlipidemia - Pt with last lipid panel 10/2010 with hypercholesteremia (LDL 147 total 213). Pt was continued on home simvastatin 20 mg daily during  hospitalization.   Chronic Kidney Disease Stage 3 - Pt with stable renal function during hospitalization. Nephrotoxic agents were held during hospitalization.   GERD - Pt with no reported acid reflux symptoms during hospitalization. Pt was continued on home protonix daily during hospitalization.     Hypokalemia - Pt with low potassium levels on admission most likely due to poor PO intake. Magnesium levels were normal. Pt was repleted with potassium chloride as needed with normalization of values during hospitalization.    Asymptomatic UTI - Pt with pyuria on UA with culture revealing >100K multiple bacterial morphotypes, however UA thought to be contaminated. Pt received IV ceftriaxone for 1 day on admission and cefazolin for 3 days following surgery.   Obesity - Pt with BMI of 33.13. Pt was seen by registered dietician during hospitalization. Pt received carbohydrate modified diet and nutritional supplements during hospitalization. Weight on admission was 193 lb and at discharge was 190 lbs.    DVT Prophylaxis - Pt received SQ heparin before surgery and SCD's after  surgery with no DVT or HIT syndrome during hospitalization.    Discharge Vitals:   BP 157/61  Pulse 60  Temp(Src) 98.5 F (36.9 C) (Oral)  Resp 18  Ht _0  (1.626 m)  Wt 86.4 kg (190 lb 7.6 oz)  BMI 32.68 kg/m2  SpO2 99%  Discharge Labs:  Results for orders placed during the hospital encounter of 02/25/13 (from the past 24 hour(s))  GLUCOSE, CAPILLARY     Status: Abnormal   Collection Time    03/06/13  4:11 PM      Result Value Range   Glucose-Capillary 302 (*) 70 - 99 mg/dL  GLUCOSE, CAPILLARY     Status: Abnormal   Collection Time    03/06/13  9:24 PM      Result Value Range   Glucose-Capillary 325 (*) 70 - 99 mg/dL   Comment 1 Notify RN    CBC     Status: Abnormal   Collection Time    03/07/13  5:04 AM      Result Value Range   WBC 15.2 (*) 4.0 - 10.5 K/uL   RBC 3.53 (*) 3.87 - 5.11 MIL/uL   Hemoglobin 9.0 (*) 12.0 - 15.0 g/dL   HCT 27.5 (*) 36.0 - 46.0 %   MCV 77.9 (*) 78.0 - 100.0 fL   MCH 25.5 (*) 26.0 - 34.0 pg   MCHC 32.7  30.0 - 36.0 g/dL   RDW 17.0 (*) 11.5 - 15.5 %   Platelets 245  150 - 400 K/uL  GLUCOSE, CAPILLARY     Status: Abnormal   Collection Time    03/07/13  6:32 AM      Result Value Range   Glucose-Capillary 207 (*) 70 - 99 mg/dL  GLUCOSE, CAPILLARY     Status: Abnormal   Collection Time    03/07/13 11:20 AM      Result Value Range   Glucose-Capillary 266 (*) 70 - 99 mg/dL    Signed: Juluis Mire, MD 03/07/2013, 3:47 PM   Time Spent on Discharge: 90 minutes Services Ordered on Discharge:  Home health OT, PT, RN Equipment Ordered on Discharge: none

## 2013-03-07 NOTE — Progress Notes (Signed)
Internal Medicine Attending  Date: 03/07/2013  Patient name: [name] Medical record number: [id number] Date of birth: 09/01/1933 Age: 80 y.o. Gender: female  I saw and evaluated the patient, and discussed her care on A.M rounds with housestaff.  I reviewed the resident's note by Dr. Naaman Plummer and I agree with the resident's findings and plans as documented in her note.

## 2013-03-07 NOTE — Progress Notes (Signed)
Physical Therapy Treatment Patient Details Name: [name] MRN: [id number] DOB: 10/10/1933 Today's Date: 03/07/2013 Time: 1100-1119 PT Time Calculation (min): 19 min  PT Assessment / Plan / Recommendation  History of Present Illness The patient is a 80 yo woman, history of meningiomas, presenting 1/10 with new-onset seizures, with CT showing edema and midline shift surrounding meningioma, s/p resection of meningioma 1/15.   PT Comments   Pt agreeable to participate in therapy.  Ambulated from room<>ortho gym & performed steps with min guard.    Follow Up Recommendations  Supervision/Assistance - 24 hour;Home health PT     Does the patient have the potential to tolerate intense rehabilitation     Barriers to Discharge        Equipment Recommendations  None recommended by PT    Recommendations for Other Services OT consult  Frequency Min 3X/week   Progress towards PT Goals Progress towards PT goals: Progressing toward goals  Plan Current plan remains appropriate    Precautions / Restrictions Precautions Precautions: Fall Restrictions Weight Bearing Restrictions: No   Pertinent Vitals/Pain No c/o pain.      Mobility  Bed Mobility General bed mobility comments: oob upon arrival, pt. was amb. back from gym with p.t.a.  Transfers Overall transfer level: Needs assistance Equipment used: Rolling walker (2 wheeled) Transfers: Sit to/from Stand Sit to Stand: Min guard Stand pivot transfers: Min guard General transfer comment: cues for hand placement & safe technique.   Ambulation/Gait Ambulation/Gait assistance: Min guard Ambulation Distance (Feet): 120 Feet (60' x 2) Assistive device: Rolling walker (2 wheeled) Gait Pattern/deviations: Step-through pattern;Decreased stride length (decreased floor clearance) Gait velocity: decreased  Gait velocity interpretation: Below normal speed for age/gender General Gait Details: cues for body positioning inside RW & safety with  walker, also cues to focus straight ahead as pt tends to keep attention to Rt side.   Stairs: Yes Stairs assistance: Min guard Stair Management: Two rails;Step to pattern;Forwards Number of Stairs: 4 General stair comments: cues for technique.        PT Goals (current goals can now be found in the care plan section) Acute Rehab PT Goals Patient Stated Goal: to go home with daughter PT Goal Formulation: With patient Time For Goal Achievement: 03/18/13 Potential to Achieve Goals: Good  Visit Information  Last PT Received On: 03/07/13 Assistance Needed: +1 History of Present Illness: The patient is a 80 yo woman, history of meningiomas, presenting 1/10 with new-onset seizures, with CT showing edema and midline shift surrounding meningioma, s/p resection of meningioma 1/15.    Subjective Data  Patient Stated Goal: to go home with daughter   Cognition  Cognition Arousal/Alertness: Awake/alert Behavior During Therapy: Flat affect Overall Cognitive Status: Within Functional Limits for tasks assessed    Balance     End of Session PT - End of Session Activity Tolerance: Patient tolerated treatment well Patient left: in chair;with call bell/phone within reach;Other (comment) (OT present) Nurse Communication: Mobility status   GP     Sena Hitch 03/07/2013, [place], PTA [phone] 03/07/2013

## 2013-03-08 ENCOUNTER — Telehealth: Payer: Self-pay | Admitting: Dietician

## 2013-03-08 ENCOUNTER — Telehealth: Payer: Self-pay | Admitting: Internal Medicine

## 2013-03-08 NOTE — Telephone Encounter (Signed)
Called to let her know that Dr. Naaman Plummer signed out a sample Novolog flexpen for her mother. Daughter to pick it up today.

## 2013-03-08 NOTE — Telephone Encounter (Signed)
Daughter called needed to pick up Novolog but from [facility] office but closed office closed at [place] and daughter did not have anyone to watch the kids.  Mothers Blood glucose was 470 just now when she checked.  She has Lantus.  She takes 15 units at night.    Advised to take 20 units tonight and pick up Novolog in the am.    Aundra Dubin MD

## 2013-03-08 NOTE — Telephone Encounter (Signed)
Calling to assist with transition of care from hospital to home. Discharge date:03-07-13 Call date: 03-08-13 Hospital follow up appointment date: 03-10-13 at 1:30 PM and 2:45 PM  Spoke with [name], patient daughter who helps care for her,  who said her mother is feeling better, eating (ate eggs bacon and biscuit this am, drinking diet sodas), waking small amounts ( to the bathroom)  Discharge medications reviewed: No Able to fill all prescriptions? No- they were too expensive, bought seizure medication, insulin was 40$, she cannot buy that until January 26th.  Patient aware of hospital follow up appointments.  We confirmed during our phone call today.  No problems with transportation. No denies Other problems/concerns: [name] asked if it was play for her mother to eat fruit. CDE encouraged small portions of fruit as desired throughout the day.   Comment: [name] blood sugar today was 277 fasting today. She had no lantus last night because nurse told her to wait until tonight since she had given it yesterday am.

## 2013-03-09 ENCOUNTER — Telehealth: Payer: Self-pay | Admitting: *Deleted

## 2013-03-09 NOTE — Telephone Encounter (Signed)
Spoke with daughter, she verbalized instructions back to me also will have her call [phone] at [phone] and ask for internal medicine resident on call, she voices understanding and of appts tomorrow with donna and dr gill

## 2013-03-09 NOTE — Telephone Encounter (Signed)
Pt's daughter calls and is quite alarmed by her mother's cbg's. Last hs 215 dr Aundra Dubin advised 20units lantus at appr 2130. This am fasting was 377 gave 8units novolog. At 1400 cbg 511. Eating at this time chicken and broccoli. Pt states she feels well. Pt was disch 2 days ago and is on decadron at this time. Daughter states she was told that cbg's would increase but the sliding scale that was given to them at disch only goes to 285. Please advise Daughter's # [phone]

## 2013-03-09 NOTE — Telephone Encounter (Signed)
It appears her correction scale was abbreviated in her discharge instructions: it should have the following added to it:  If CBG before meal is   281- 350 inject 10 units Novolog  351- 420 inject 12 units Novolog 421- 490 inject 14 units Novolog 491- 560 inject 16 units Novolog and call the office.   Also her dose of Lantus is supposed to be 25 units at bedtime and she had not had any the night of her discharge which may be increasing CBGs as well as steroids. Marland Kitchen Spoke with [name] ( patient's daughter) who says her mother if feeling fine and her CBGs today have been: 377/311/513 and she'll check again at 6:30 PM. Her mother has had 8 units Novolog with the 377( should have had 12 units)  and 10 units when CBG was 513 (should have had 16 units) . Thereasa Parkin the above scale to add onto the discharge instruction scale and discussed lantus dose at discharge was supposed to be 25 units. She verbalized understanding, knows to encourage sugar free liquids.and will see [place] tomorrow afternoon.

## 2013-03-09 NOTE — Telephone Encounter (Signed)
Butch Penny, can you please reach out to her?

## 2013-03-09 NOTE — Telephone Encounter (Signed)
Please ask her to give 10 units Novolog now. Repeat CBG in 4 hours. Use SSI to dose Novolog on 4 hrs at 6:30 PM. Please ask her to call us if the CBG is not decreasing. Pls sch appt Butch Penny and MD to address uncontrolled DM. Thanks

## 2013-03-09 NOTE — Telephone Encounter (Signed)
Thank you for following up on [name]. I will also forward this to Dr. Naaman Plummer and she did the discharge. Hopefully tomorrow they can get more insulin assistance and education and they will improve with decadron taper.

## 2013-03-10 ENCOUNTER — Ambulatory Visit (INDEPENDENT_AMBULATORY_CARE_PROVIDER_SITE_OTHER): Payer: Medicare Other | Admitting: Internal Medicine

## 2013-03-10 ENCOUNTER — Encounter: Payer: Self-pay | Admitting: Internal Medicine

## 2013-03-10 ENCOUNTER — Ambulatory Visit (INDEPENDENT_AMBULATORY_CARE_PROVIDER_SITE_OTHER): Payer: Medicare Other | Admitting: Dietician

## 2013-03-10 VITALS — BP 106/62 | HR 70 | Temp 97.7°F | Ht 64.0 in | Wt 194.0 lb

## 2013-03-10 VITALS — Wt 194.0 lb

## 2013-03-10 DIAGNOSIS — D320 Benign neoplasm of cerebral meninges: Secondary | ICD-10-CM

## 2013-03-10 DIAGNOSIS — I10 Essential (primary) hypertension: Secondary | ICD-10-CM | POA: Diagnosis not present

## 2013-03-10 DIAGNOSIS — E119 Type 2 diabetes mellitus without complications: Secondary | ICD-10-CM

## 2013-03-10 DIAGNOSIS — D649 Anemia, unspecified: Secondary | ICD-10-CM

## 2013-03-10 LAB — CBC WITH DIFFERENTIAL/PLATELET
BASOS PCT: 0 % (ref 0–1)
Basophils Absolute: 0 10*3/uL (ref 0.0–0.1)
EOS ABS: 0 10*3/uL (ref 0.0–0.7)
Eosinophils Relative: 0 % (ref 0–5)
HCT: 32.2 % — ABNORMAL LOW (ref 36.0–46.0)
HEMOGLOBIN: 10 g/dL — AB (ref 12.0–15.0)
LYMPHS ABS: 1.3 10*3/uL (ref 0.7–4.0)
Lymphocytes Relative: 6 % — ABNORMAL LOW (ref 12–46)
MCH: 24.9 pg — AB (ref 26.0–34.0)
MCHC: 31.1 g/dL (ref 30.0–36.0)
MCV: 80.3 fL (ref 78.0–100.0)
Monocytes Absolute: 1.3 10*3/uL — ABNORMAL HIGH (ref 0.1–1.0)
Monocytes Relative: 6 % (ref 3–12)
Neutro Abs: 19.3 10*3/uL — ABNORMAL HIGH (ref 1.7–7.7)
Neutrophils Relative %: 88 % — ABNORMAL HIGH (ref 43–77)
Platelets: 327 10*3/uL (ref 150–400)
RBC: 4.01 MIL/uL (ref 3.87–5.11)
RDW: 16.8 % — ABNORMAL HIGH (ref 11.5–15.5)
WBC: 21.9 10*3/uL — ABNORMAL HIGH (ref 4.0–10.5)

## 2013-03-10 LAB — GLUCOSE, CAPILLARY: Glucose-Capillary: 442 mg/dL — ABNORMAL HIGH (ref 70–99)

## 2013-03-10 NOTE — Progress Notes (Signed)
Subjective:   Patient ID: [name] female    DOB: [birth year] 80 y.o.    MRN: [id number]  ____________________________________  HPI: [name] is a 80 y.o. female here for a hospital follow-up.  Pt has a PMH outlined below.  Please see problem-based assessment and plan for further details of medical issues addressed at today's visit.   PMH: Past Medical History  Diagnosis Date  . Diabetes mellitus 2007    HgA1C (02/20/2010) = 9.2, HgA1C (03/20/2009) = 12.1  . Hyperlipidemia   . Hypertension   . GERD (gastroesophageal reflux disease)   . CVA (cerebrovascular accident) 0254     right embolic stroke, no residual deficits  . Diverticulitis   . CVA (cerebral infarction) 7-yrs ago  . Arthritis   . Dysphagia   . CHF (congestive heart failure)   . DIABETES MELLITUS, TYPE II 12/04/2005  . HYPERLIPIDEMIA 12/04/2005  . HYPERTENSION 12/04/2005  . GERD 12/04/2005  . ARTHRITIS, KNEE 03/24/2006  . Lung mass 07/08/2010  . Meningioma 07/21/2010  . Hemangioma of liver 12/02/2010  . Depression 12/02/2010  . Adenocarcinoma of lung      Right upper lobe adenocarcinoma.     Medications:  (Not in a hospital admission)  Allergies: No Known Allergies  FH: Family History  Problem Relation Age of Onset  . Hyperlipidemia Brother   . Hypertension Brother   . Diabetes Brother     SH: History   Social History  . Marital Status: Widowed    Spouse Name: N/A    Number of Children: N/A  . Years of Education: N/A   Social History Main Topics  . Smoking status: Former Smoker    Types: Cigarettes    Quit date: 02/17/2000  . Smokeless tobacco: Never Used  . Alcohol Use: No  . Drug Use: No  . Sexual Activity: None   Other Topics Concern  . None   Social History Narrative   Patient requests to use [employer] for her diabets testing supplies as of 09/05/2009.    Review of Systems: Constitutional: Denies fever/chills, diaphoresis, unintentional weight loss,  appetite change or fatigue.  Respiratory: Denies SOB (at rest or with exertion), cough, chest tightness, and wheezing.   Cardiovascular: Denies CP, palpitations and leg swelling.  Gastrointestinal: Denies N/V/D/C, abdominal pain, and blood/changes in stool. Genitourinary: Denies dysuria, urgency, frequency, hematuria, and difficulty urinating.  Endocrine: Denies: hot/cold intolerance, polyuria, polyphagia, polydipsia. Skin: Denies rashes or wounds.  Neurological: Denies seizures, syncope, numbness and headaches.  Hematological: Denies adenopathy or easy bruising. Psychiatric/Behavioral: Denies mood changes, sleep disturbance.   Objective:   Vital Signs: Filed Vitals:   03/10/13 1435  BP: 106/62  Pulse: 70  Temp: 97.7 F (36.5 C)  TempSrc: Oral  Height: 5\' 4"  (1.626 m)  Weight: 194 lb (87.998 kg)  SpO2: 100%      BP Readings from Last 3 Encounters:  03/10/13 106/62  03/07/13 157/61  03/07/13 157/61    Physical Exam: Constitutional: Vital signs reviewed.  Patient is well-developed and well-nourished in NAD and cooperative with exam.  Head: Normocephalic and atraumatic. Eyes: PERRL, EOMI, conjunctivae nl, no scleral icterus.  Neck: Supple, Trachea midline, no JVD appreciated. Cardiovascular: RRR, no MRG, pulses symmetric and intact b/l. Pulmonary/Chest: normal respiratory effort, non-tender to palpation, CTAB, no wheezes, rales, or rhonchi. Abdominal: Thin. Soft. NT/ND +BS. Neurological: A&O x3, cranial nerves II-XII are grossly intact, moving all extremities. Extremities: 2+DP b/l; no pitting edema. Skin: Warm, dry and intact. No rash, cyanosis,  or clubbing.  Psychiatric: Normal mood.   Most Recent Laboratory Results:  CMP     Component Value Date/Time   NA 139 03/05/2013 0340   NA 146* 07/20/2011 1527   K 4.3 03/05/2013 0340   K 4.2 07/20/2011 1527   CL 103 03/05/2013 0340   CL 105 07/20/2011 1527   CO2 24 03/05/2013 0340   CO2 30 07/20/2011 1527   GLUCOSE 285* 03/05/2013  0340   GLUCOSE 129* 07/20/2011 1527   BUN 22 03/05/2013 0340   BUN 14 07/20/2011 1527   CREATININE 0.88 03/05/2013 0340   CREATININE 2.16* 01/30/2013 1652   CALCIUM 7.7* 03/05/2013 0340   CALCIUM 8.6 07/20/2011 1527   PROT 7.5 02/24/2013 1602   PROT 7.5 07/20/2011 1527   ALBUMIN 3.5 02/24/2013 1602   AST 10 02/24/2013 1602   AST 20 07/20/2011 1527   ALT 9 02/24/2013 1602   ALT 24 07/20/2011 1527   ALKPHOS 82 02/24/2013 1602   ALKPHOS 63 07/20/2011 1527   BILITOT <0.2* 02/24/2013 1602   BILITOT 0.50 07/20/2011 1527   GFRNONAA 62* 03/05/2013 0340   GFRAA 72* 03/05/2013 0340    CBC    Component Value Date/Time   WBC 15.2* 03/07/2013 0504   WBC 6.0 07/20/2011 1534   RBC 3.53* 03/07/2013 0504   RBC 4.11 07/20/2011 1534   HGB 9.0* 03/07/2013 0504   HGB 9.9* 07/20/2011 1534   HCT 27.5* 03/07/2013 0504   HCT 31.5* 07/20/2011 1534   PLT 245 03/07/2013 0504   PLT 298 07/20/2011 1534   MCV 77.9* 03/07/2013 0504   MCV 76.7* 07/20/2011 1534   MCH 25.5* 03/07/2013 0504   MCH 24.0* 07/20/2011 1534   MCHC 32.7 03/07/2013 0504   MCHC 31.3* 07/20/2011 1534   RDW 17.0* 03/07/2013 0504   RDW 15.4* 07/20/2011 1534   LYMPHSABS 3.4 03/03/2013 0100   LYMPHSABS 2.0 07/20/2011 1534   MONOABS 1.5* 03/03/2013 0100   MONOABS 0.7 07/20/2011 1534   EOSABS 0.0 03/03/2013 0100   EOSABS 0.1 07/20/2011 1534   BASOSABS 0.0 03/03/2013 0100   BASOSABS 0.0 07/20/2011 1534    Lipid Panel Lab Results  Component Value Date   CHOL 213* 11/10/2010   HDL 53 11/10/2010   LDLCALC 147* 11/10/2010   TRIG 65 11/10/2010   CHOLHDL 4.0 11/10/2010    HA1C Lab Results  Component Value Date   HGBA1C 12.1 12/23/2012    Urinalysis    Component Value Date/Time   COLORURINE YELLOW 02/25/2013 0237   APPEARANCEUR CLOUDY* 02/25/2013 0237   LABSPEC 1.020 02/25/2013 0237   PHURINE 6.5 02/25/2013 0237   GLUCOSEU >1000* 02/25/2013 0237   HGBUR TRACE* 02/25/2013 0237   HGBUR trace-intact 03/20/2009 1000   BILIRUBINUR NEGATIVE 02/25/2013 0237   BILIRUBINUR negative 09/07/2011 1645    KETONESUR NEGATIVE 02/25/2013 0237   PROTEINUR NEGATIVE 02/25/2013 0237   UROBILINOGEN 0.2 02/25/2013 0237   UROBILINOGEN 0.2 09/07/2011 1645   NITRITE NEGATIVE 02/25/2013 0237   NITRITE positive 09/07/2011 1645   LEUKOCYTESUR SMALL* 02/25/2013 0237    Urine Microalbumin Lab Results  Component Value Date   MICROALBUR 2.25* 07/26/2008    Imaging N/A  Assessment & Plan:   Assessment and plan was discussed and formulated with my attending.

## 2013-03-10 NOTE — Patient Instructions (Addendum)
Thank you for your visit today. Please return to the internal medicine clinic in February for your visit with Dr. [name] or sooner if needed.    Your current medical regimen is effective;  continue all medications. Please use the following scale for your insulin dosing: Continue to take 25 units of lantus at bedtime. Please use the following scale for your novolog mealtime correction: 91-160 inject _ 5___ units  161-230 inject __ 7__ units  231-300 inject __ 9__ units  301-370 inject __11__ units  371-440 inject __13__ units  441-510 inject __15__ units  > or = 511 inject 17 units and call office or doctor on call [phone]  If you believe that you are suffering from a life threatening condition or one that may result in the loss of limb or function, then you should call 911 or proceed to the nearest Emergency Department.

## 2013-03-10 NOTE — Patient Instructions (Addendum)
Please try to eat about 3-4 carb choices (45-60 grams carbohydrate) at each meal and 1 for snacks to help keep your blood sugar stable   Examples:  2 starch + 1 fruit = 3 carb choices    1 starch + 1 milk + 1 fruit = 3 carb choices    2 starch + 1 milk + 1 sweet = 4 carb choices    3 starches + 1 sweet  = 4 carb choices  Veggies, healthy fats  and meats do not count (because they do not increase blood sugar much) - add them as needed.   Please make a follow up appointment for 3-4 weeks

## 2013-03-10 NOTE — Progress Notes (Addendum)
Medical Nutrition Therapy:  Appt start time: 1330 end time:  0233.  Assessment:  Primary concerns today: Blood sugar control and Meal planning.  Patient here with daughter, [name] and granddaughter. Watched daughter give Novolog injection (14 units in left thigh) and check  CBG (422) after lunch of sandwich and juice (4 carbs total). Instructed daughter on proper way of testing blood sugar and giving insulin injection using an insulin pen.   CBgs high: fastings increasing [phone]                    Lunch: [phone] due to peak of steroid in am?                     Dinner: 358  Medications: Current TDD is ~ 50 units/day (25 units Lantus and 24-26 units Novolog. Recomend 10-15% increase (5-7units)for better blood sugar control Current Novolog scale includes 4 units for each meal and patient requires ~ 1 unit for each 9 grams carb. She is eating ~ 45-60. Therefore would recommend increasing scale by 1- 2 units and consider increasing basal by 2- 4 units and continue to monitor. On steroids twice daily then begin taper this weekend for 5 days  Usual eating pattern includes 3 meals and 0-1 snacks per day. Frequent foods include juice, meats, vegtables.   24-hr recall: B ( AM)-  Sausage biscuit, boilled egg & tea or [employer] oatmeal with apples and maple (?), orange juice   L ( PM)-  Baked chicken with broccoli, 0 carb juice or sandwich with 8 oz juice  D ( PM)- Hamburger with green beans, 1 sl bread and water or 0 carb juice     Usual physical activity includes ADLs.  Progress Towards Goal(s):  In progress.   Nutritional Diagnosis:  NB-1.1 Food and nutrition-related knowledge deficit As related to lack of previous carb counting education & training on diabetes meal planning.  As evidenced by daughter and grandaughter response to information and food model demonstration.    Intervention:  Nutrition education for patient and family on carb counting with emphasis on consistency. Family  and patient agree that to target ~ 3-4 carb servings per meal is appropriate and adequate. However, anticipate patient will eat variable amounts of carbs at meals )at least for a while yet) with new learning on family's part and appetite on patient's part.    Monitoring/Evaluation:  Dietary intake, exercise, meter body weight in 3 week(s).

## 2013-03-12 ENCOUNTER — Other Ambulatory Visit: Payer: Self-pay | Admitting: Internal Medicine

## 2013-03-12 ENCOUNTER — Encounter: Payer: Self-pay | Admitting: Internal Medicine

## 2013-03-12 NOTE — Assessment & Plan Note (Signed)
BP Readings from Last 3 Encounters:  03/10/13 106/62  03/07/13 157/61  03/07/13 157/61    Lab Results  Component Value Date   NA 139 03/05/2013   K 4.3 03/05/2013   CREATININE 0.88 03/05/2013    Assessment: Blood pressure control: controlled Progress toward BP goal:  at goal  Plan: Medications:  continue current medications

## 2013-03-12 NOTE — Assessment & Plan Note (Addendum)
Pt is compliant with keppra.  Incision is clean and dry without signs of infection-pt will need to have staples removed.  -follow up with neurosurgery -continue keppra

## 2013-03-12 NOTE — Assessment & Plan Note (Addendum)
Lab Results  Component Value Date   HGBA1C 12.1 12/23/2012   HGBA1C 10.8 09/06/2012   HGBA1C 10.9 04/29/2012     Assessment: Diabetes control: poor control (HgbA1C >9%) Progress toward A1C goal:  deteriorated Comments: Pt on dexamethasone  Plan: Medications:  continue current medications; pt provided the following scale for novolog correction:  91-160 inject ___5___ units   161-230 inject __ 7__ units   231-300 inject __ 9__ units   301-370 inject __11__ units   371-440 inject __13__ units   441-510 inject __15__ units   > or = 511 inject 17 units and call office or doctor on call [phone]  Instruction/counseling given: reminded to bring medications to each visit Other plans: Reassess once pt off dexamethasone

## 2013-03-14 DIAGNOSIS — N390 Urinary tract infection, site not specified: Secondary | ICD-10-CM | POA: Diagnosis not present

## 2013-03-14 DIAGNOSIS — I509 Heart failure, unspecified: Secondary | ICD-10-CM | POA: Diagnosis not present

## 2013-03-14 DIAGNOSIS — K612 Anorectal abscess: Secondary | ICD-10-CM | POA: Diagnosis not present

## 2013-03-14 DIAGNOSIS — I10 Essential (primary) hypertension: Secondary | ICD-10-CM | POA: Diagnosis not present

## 2013-03-14 DIAGNOSIS — E119 Type 2 diabetes mellitus without complications: Secondary | ICD-10-CM | POA: Diagnosis not present

## 2013-03-14 DIAGNOSIS — Z48815 Encounter for surgical aftercare following surgery on the digestive system: Secondary | ICD-10-CM | POA: Diagnosis not present

## 2013-03-16 DIAGNOSIS — Z48815 Encounter for surgical aftercare following surgery on the digestive system: Secondary | ICD-10-CM | POA: Diagnosis not present

## 2013-03-16 DIAGNOSIS — E119 Type 2 diabetes mellitus without complications: Secondary | ICD-10-CM | POA: Diagnosis not present

## 2013-03-16 DIAGNOSIS — N390 Urinary tract infection, site not specified: Secondary | ICD-10-CM | POA: Diagnosis not present

## 2013-03-16 DIAGNOSIS — I509 Heart failure, unspecified: Secondary | ICD-10-CM | POA: Diagnosis not present

## 2013-03-16 DIAGNOSIS — K612 Anorectal abscess: Secondary | ICD-10-CM | POA: Diagnosis not present

## 2013-03-16 DIAGNOSIS — I10 Essential (primary) hypertension: Secondary | ICD-10-CM | POA: Diagnosis not present

## 2013-03-17 ENCOUNTER — Telehealth: Payer: Self-pay | Admitting: *Deleted

## 2013-03-17 ENCOUNTER — Other Ambulatory Visit: Payer: Self-pay | Admitting: Internal Medicine

## 2013-03-17 NOTE — Telephone Encounter (Signed)
Message copied by Ebbie Latus on Fri Mar 17, 2013  4:38 PM ------      Message from: Wilber Oliphant      Created: Fri Mar 17, 2013  2:17 PM       Dec 19 she was given 90 tablet prescription. If she is out let me know and i will refill. But she shouldn't be out            Thanks,            Dr q      ----- Message -----         From: Ebbie Latus, RN         Sent: 03/17/2013   1:42 PM           To: Jerene Pitch, MD            Pt's daughter stated Ms Boyan did not get a rx upon d/c from the hospital.      ----- Message -----         From: Jerene Pitch, MD         Sent: 03/17/2013  12:35 PM           To: Ebbie Latus, RN            The lasix was just filled in the hospital i believe. Is she out already. i have received two requests.              ------

## 2013-03-17 NOTE — Telephone Encounter (Signed)
I called [facility] - stated Lasix was filled in December for 90 tabs. Called [name] and spoked to the daughter again; who stated she does have "some pills". And when she starts running low, she calls the pharmacy. I told her, pt should had received #90 tabs per [facility]; she stated ok.

## 2013-03-17 NOTE — Telephone Encounter (Signed)
Please confirm if she has it, if not we will need to refill again

## 2013-03-20 ENCOUNTER — Telehealth: Payer: Self-pay | Admitting: *Deleted

## 2013-03-20 DIAGNOSIS — I509 Heart failure, unspecified: Secondary | ICD-10-CM | POA: Diagnosis not present

## 2013-03-20 DIAGNOSIS — Z48815 Encounter for surgical aftercare following surgery on the digestive system: Secondary | ICD-10-CM | POA: Diagnosis not present

## 2013-03-20 DIAGNOSIS — I10 Essential (primary) hypertension: Secondary | ICD-10-CM | POA: Diagnosis not present

## 2013-03-20 DIAGNOSIS — K612 Anorectal abscess: Secondary | ICD-10-CM | POA: Diagnosis not present

## 2013-03-20 DIAGNOSIS — N390 Urinary tract infection, site not specified: Secondary | ICD-10-CM | POA: Diagnosis not present

## 2013-03-20 DIAGNOSIS — E119 Type 2 diabetes mellitus without complications: Secondary | ICD-10-CM | POA: Diagnosis not present

## 2013-03-20 NOTE — Progress Notes (Signed)
Case discussed with Dr. Gill soon after the resident saw the patient.  We reviewed the resident's history and exam and pertinent patient test results.  I agree with the assessment, diagnosis, and plan of care documented in the resident's note. 

## 2013-03-20 NOTE — Telephone Encounter (Signed)
Kikki with [facility] PT [phone]  called BP 86/58 and 92/60 pulse 60 and temp 98.1. Just took BP and seizure meds this AM. - pt was alert and feeling fine. Denies any dizziness with movement.  Talked with [facility] and was to recheck BP again today - would call clinic  if readings are still low. Hilda Blades Paz Winsett RN 03/20/13 12N

## 2013-03-21 DIAGNOSIS — K612 Anorectal abscess: Secondary | ICD-10-CM | POA: Diagnosis not present

## 2013-03-21 DIAGNOSIS — Z48815 Encounter for surgical aftercare following surgery on the digestive system: Secondary | ICD-10-CM | POA: Diagnosis not present

## 2013-03-21 DIAGNOSIS — I509 Heart failure, unspecified: Secondary | ICD-10-CM | POA: Diagnosis not present

## 2013-03-21 DIAGNOSIS — E119 Type 2 diabetes mellitus without complications: Secondary | ICD-10-CM | POA: Diagnosis not present

## 2013-03-21 DIAGNOSIS — N390 Urinary tract infection, site not specified: Secondary | ICD-10-CM | POA: Diagnosis not present

## 2013-03-21 DIAGNOSIS — I10 Essential (primary) hypertension: Secondary | ICD-10-CM | POA: Diagnosis not present

## 2013-03-21 NOTE — Telephone Encounter (Signed)
i tried pt's ph# no answer and cannot leave a message. Called KIKI she will see pt tomorrow and call clinic with an update, she did speak with the family early this am and they stated pt was feeling fine

## 2013-03-21 NOTE — Telephone Encounter (Signed)
Can we get an update on this blood pressure today? If remaining low will need to hold her medications likely. What has she been taking?

## 2013-03-21 NOTE — Telephone Encounter (Signed)
Great thank you. If still low, she may need to be seen in [facility] for re-evaluation of medications as well.

## 2013-03-22 ENCOUNTER — Telehealth: Payer: Self-pay | Admitting: *Deleted

## 2013-03-22 DIAGNOSIS — Z48815 Encounter for surgical aftercare following surgery on the digestive system: Secondary | ICD-10-CM | POA: Diagnosis not present

## 2013-03-22 DIAGNOSIS — E119 Type 2 diabetes mellitus without complications: Secondary | ICD-10-CM | POA: Diagnosis not present

## 2013-03-22 DIAGNOSIS — I10 Essential (primary) hypertension: Secondary | ICD-10-CM | POA: Diagnosis not present

## 2013-03-22 DIAGNOSIS — I509 Heart failure, unspecified: Secondary | ICD-10-CM | POA: Diagnosis not present

## 2013-03-22 DIAGNOSIS — K612 Anorectal abscess: Secondary | ICD-10-CM | POA: Diagnosis not present

## 2013-03-22 DIAGNOSIS — N390 Urinary tract infection, site not specified: Secondary | ICD-10-CM | POA: Diagnosis not present

## 2013-03-22 NOTE — Telephone Encounter (Signed)
Kikki with [facility] called today BP 120/64, pulse 88 and temp 99.8 - no meds so far today.  BP checked 03/21/13 110/58 at 1PM. Calling in BP readings as a FU from the other day of low BP. Hilda Blades Hau Sanor RN 03/22/13 11AM

## 2013-03-22 NOTE — Telephone Encounter (Signed)
Thank you. Those BP readings look better, however, if she is normotensive without medications, the meds need to be adjusted. Can she be seen in opc this week? In the meantime, please have [facility] continue to follow the pressure.   Thank you,  Dr q

## 2013-03-24 DIAGNOSIS — I10 Essential (primary) hypertension: Secondary | ICD-10-CM | POA: Diagnosis not present

## 2013-03-24 DIAGNOSIS — E119 Type 2 diabetes mellitus without complications: Secondary | ICD-10-CM | POA: Diagnosis not present

## 2013-03-24 DIAGNOSIS — N390 Urinary tract infection, site not specified: Secondary | ICD-10-CM | POA: Diagnosis not present

## 2013-03-24 DIAGNOSIS — K612 Anorectal abscess: Secondary | ICD-10-CM | POA: Diagnosis not present

## 2013-03-24 DIAGNOSIS — I509 Heart failure, unspecified: Secondary | ICD-10-CM | POA: Diagnosis not present

## 2013-03-24 DIAGNOSIS — Z48815 Encounter for surgical aftercare following surgery on the digestive system: Secondary | ICD-10-CM | POA: Diagnosis not present

## 2013-03-26 ENCOUNTER — Encounter: Payer: Self-pay | Admitting: Radiation Oncology

## 2013-03-27 ENCOUNTER — Ambulatory Visit: Admission: RE | Admit: 2013-03-27 | Payer: Medicare Other | Source: Ambulatory Visit

## 2013-03-27 ENCOUNTER — Ambulatory Visit
Admission: RE | Admit: 2013-03-27 | Discharge: 2013-03-27 | Disposition: A | Discharge status: Home / Self Care | Payer: Medicare Other | Source: Ambulatory Visit | Attending: Radiation Oncology | Admitting: Radiation Oncology

## 2013-03-27 DIAGNOSIS — E119 Type 2 diabetes mellitus without complications: Secondary | ICD-10-CM | POA: Diagnosis not present

## 2013-03-27 DIAGNOSIS — I509 Heart failure, unspecified: Secondary | ICD-10-CM | POA: Diagnosis not present

## 2013-03-27 DIAGNOSIS — N390 Urinary tract infection, site not specified: Secondary | ICD-10-CM | POA: Diagnosis not present

## 2013-03-27 DIAGNOSIS — K612 Anorectal abscess: Secondary | ICD-10-CM | POA: Diagnosis not present

## 2013-03-27 DIAGNOSIS — Z48815 Encounter for surgical aftercare following surgery on the digestive system: Secondary | ICD-10-CM | POA: Diagnosis not present

## 2013-03-27 DIAGNOSIS — I10 Essential (primary) hypertension: Secondary | ICD-10-CM | POA: Diagnosis not present

## 2013-03-27 DIAGNOSIS — D320 Benign neoplasm of cerebral meninges: Secondary | ICD-10-CM

## 2013-03-28 ENCOUNTER — Encounter: Payer: Self-pay | Admitting: Internal Medicine

## 2013-03-28 ENCOUNTER — Ambulatory Visit (INDEPENDENT_AMBULATORY_CARE_PROVIDER_SITE_OTHER): Payer: Medicare Other | Admitting: Internal Medicine

## 2013-03-28 VITALS — BP 118/68 | HR 86 | Temp 99.9°F | Wt 186.2 lb

## 2013-03-28 DIAGNOSIS — N390 Urinary tract infection, site not specified: Secondary | ICD-10-CM

## 2013-03-28 DIAGNOSIS — R300 Dysuria: Secondary | ICD-10-CM | POA: Diagnosis not present

## 2013-03-28 DIAGNOSIS — D320 Benign neoplasm of cerebral meninges: Secondary | ICD-10-CM | POA: Diagnosis not present

## 2013-03-28 DIAGNOSIS — I10 Essential (primary) hypertension: Secondary | ICD-10-CM

## 2013-03-28 DIAGNOSIS — E119 Type 2 diabetes mellitus without complications: Secondary | ICD-10-CM

## 2013-03-28 DIAGNOSIS — K612 Anorectal abscess: Secondary | ICD-10-CM | POA: Diagnosis not present

## 2013-03-28 DIAGNOSIS — I509 Heart failure, unspecified: Secondary | ICD-10-CM | POA: Diagnosis not present

## 2013-03-28 DIAGNOSIS — Z48815 Encounter for surgical aftercare following surgery on the digestive system: Secondary | ICD-10-CM | POA: Diagnosis not present

## 2013-03-28 LAB — POCT GLYCOSYLATED HEMOGLOBIN (HGB A1C): Hemoglobin A1C: 9.7

## 2013-03-28 LAB — GLUCOSE, CAPILLARY: Glucose-Capillary: 222 mg/dL — ABNORMAL HIGH (ref 70–99)

## 2013-03-28 MED ORDER — CIPROFLOXACIN HCL 250 MG PO TABS: 250.0000 mg | ORAL_TABLET | Freq: Two times a day (BID) | ORAL | Status: DC

## 2013-03-28 MED ORDER — DOCUSATE SODIUM 100 MG PO CAPS: 100.0000 mg | ORAL_CAPSULE | Freq: Every day | ORAL | Status: DC | PRN

## 2013-03-28 NOTE — Patient Instructions (Signed)
Please continue your diabetic regimen and follow up with radiation oncology and neurosurgery  We will call in antibiotics after looking at your urine sample

## 2013-03-28 NOTE — Progress Notes (Signed)
Subjective:   Patient ID: [name] female   DOB: [birth year] 80 y.o.   MRN: [id number]  HPI: [name] is a 80 y.o. African American female with PMH of HTN, poorly controlled DM2, CVA, CHF, lung mass, and recent hospital admission in January 2015 for new onset seizures secondary to enlarged meningioma s/p craniotomy tumor excision 03/02/13.   She was seen by Dr. Gordy Levan in [facility] for hospital follow up and since then has continued to follow up with neurosurgery.  She reports no more seizures and has been doing well since discharge.  The staples have been removed and healing well.  Her family has been a wonderful support system who have been taking care of her medications and blood sugars as well.  Her HbA1C has improved to 9.7 today from 12.1 in November.  She has been complaint with her medications and is schedule to see radiation oncology on the 16th of this month. They were supposed to meet with Dr. Tammi Klippel yesterday but had to reschedule.  There appears to be an incomplete note for that visit on epic but family was not aware of who they were meeting and what it was in regards too.   Her only other complaint today is dysuria x1 week. She has had multiple UTI's in the past and she says this feels similar. We reviewed hygiene improvements with the family. They reported that they have been using soap to wash her after she uses the restroom but will try to rinse thoroughly with water or gentle wipes.   Past Medical History  Diagnosis Date  . Diabetes mellitus 2007    HgA1C (02/20/2010) = 9.2, HgA1C (03/20/2009) = 12.1  . Hyperlipidemia   . Hypertension   . GERD (gastroesophageal reflux disease)   . CVA (cerebrovascular accident) 7939     right embolic stroke, no residual deficits  . Diverticulitis   . CVA (cerebral infarction) 7-yrs ago  . Arthritis   . Dysphagia   . CHF (congestive heart failure)   . DIABETES MELLITUS, TYPE II 12/04/2005  . HYPERLIPIDEMIA 12/04/2005  . HYPERTENSION  12/04/2005  . GERD 12/04/2005  . ARTHRITIS, KNEE 03/24/2006  . Lung mass 07/08/2010  . Meningioma 07/21/2010  . Hemangioma of liver 12/02/2010  . Depression 12/02/2010  . Adenocarcinoma of lung      Right upper lobe adenocarcinoma.    Current Outpatient Prescriptions  Medication Sig Dispense Refill  . albuterol (PROVENTIL HFA;VENTOLIN HFA) 108 (90 BASE) MCG/ACT inhaler Inhale 2 puffs into the lungs every 4 (four) hours as needed for wheezing.  3 Inhaler  1  . albuterol (PROVENTIL) (2.5 MG/3ML) 0.083% nebulizer solution Take 3 mLs (2.5 mg total) by nebulization every 6 (six) hours as needed for wheezing.  75 mL  12  . amLODipine (NORVASC) 10 MG tablet Take 1 tablet (10 mg total) by mouth daily.  30 tablet  3  . aspirin 81 MG tablet Take 81 mg by mouth daily.       . Blood Glucose Monitoring Suppl (ACCU-CHEK AVIVA PLUS) W/DEVICE KIT 1 each by Does not apply route 3 (three) times daily. dx code 250.00 insulin requiring  1 kit  1  . carvedilol (COREG) 25 MG tablet Take 1 tablet (25 mg total) by mouth 2 (two) times daily with a meal.  180 tablet  4  . [facility] LANCETS MICRO THIN 33G MISC Check blood sugar 3 times a day before meals dx code 250.00 insulin requiring  100 each  12  .  dexamethasone (DECADRON) 4 MG tablet Take 4 mg twice a day for 5 days starting tomorrow. Take one dose tonight 1/20 at 10:00PM  Then take 4 mg once a day for 5 days then STOP  16 tablet  0  . furosemide (LASIX) 20 MG tablet Take 1 tablet (20 mg total) by mouth daily.  90 tablet  0  . glucose blood (ACCU-CHEK AVIVA PLUS) test strip Check blood sugar 3 times a day before meals dx code 250.00 insulin requiring  100 each  12  . [START ON 04/16/2013] HYDROcodone-acetaminophen (NORCO/VICODIN) 5-325 MG per tablet Take 1 tablet by mouth 2 (two) times daily as needed for moderate pain. Do not fill before 04/16/2013  60 tablet  0  . insulin aspart (NOVOLOG FLEXPEN) 100 UNIT/ML FlexPen Take three times with meals  <70    2 units if going to  eat- skip if going to skip meal   71-140    4 units Novolog   141-210  6 units Novolog   211-280  8 units  15 mL  11  . Insulin Glargine (LANTUS SOLOSTAR) 100 UNIT/ML Solostar Pen Inject 25 Units into the skin at bedtime.  10 mL  5  . Lancet Devices (ACCU-CHEK SOFTCLIX) lancets Check blood sugar 3 times a day before meals dx code 250.00 insulin requiring  100 each  12  . levETIRAcetam (KEPPRA) 500 MG tablet Take 1 tablet (500 mg total) by mouth 2 (two) times daily.  60 tablet  11  . Loratadine 10 MG CAPS Take 1 capsule (10 mg total) by mouth daily.  30 each  0  . pantoprazole (PROTONIX) 20 MG tablet Take 20 mg by mouth daily.      . simvastatin (ZOCOR) 20 MG tablet Take 20 mg by mouth daily.       No current facility-administered medications for this visit.   Family History  Problem Relation Age of Onset  . Hyperlipidemia Brother   . Hypertension Brother   . Diabetes Brother    History   Social History  . Marital Status: Widowed    Spouse Name: N/A    Number of Children: N/A  . Years of Education: N/A   Social History Main Topics  . Smoking status: Former Smoker    Types: Cigarettes    Quit date: 02/17/2000  . Smokeless tobacco: Never Used  . Alcohol Use: No  . Drug Use: No  . Sexual Activity: None   Other Topics Concern  . None   Social History Narrative   Patient requests to use [employer] for her diabets testing supplies as of 09/05/2009.   Review of Systems:  Constitutional:  Denies fever, chills, diaphoresis  HEENT:  Denies congestion, sore throat  Respiratory:  Denies SOB, DOE, cough, and wheezing.   Cardiovascular:  Denies chest pain, palpitations, and leg swelling.   Gastrointestinal:  Constipation. Denies nausea, vomiting, abdominal pain.  Genitourinary:  Dysuria.   Musculoskeletal:  Arthralgias.    Skin:  Denies pallor, rash  Neurological:  Denies dizziness, seizures, syncope   Objective:  Physical Exam: Filed Vitals:   03/28/13 1615  BP:  118/68  Pulse: 86  Temp: 99.9 F (37.7 C)  TempSrc: Oral  Weight: 186 lb 3.2 oz (84.46 kg)  SpO2: 100%   Vitals reviewed. General: sitting in wheelchair, NAD, tired HEENT: PERRL, EOMI. Surgical healing scar on head, no tenderness to palpation, staples removed, hair growing back Cardiac: RRR Pulm: clear to auscultation bilaterally, no wheezes, rales, or  rhonchi Abd: soft, nontender, BS present Ext: warm and well perfused, no pedal edema, moving all 4 extremities Neuro: alert and oriented X3, cranial nerves II-XII grossly intact, strength 4/5 and sensation to light touch equal in bilateral upper and lower extremities  Assessment & Plan:  Discussed with Dr. Ellwood Dense Treat UTI with cipro 236m bid x3 days F/u rad-onc

## 2013-03-28 NOTE — Assessment & Plan Note (Signed)
Staples out, following up with Dr. Christella Noa x3 months. Schedule to see rad-onc on 2/16 now instead of on 2/9. Possibly radiation in the future? Otherwise doing well, no seizures, on keppra. Well healing wound.

## 2013-03-28 NOTE — Assessment & Plan Note (Addendum)
Lab Results  Component Value Date   HGBA1C 9.7 03/28/2013   HGBA1C 12.1 12/23/2012   HGBA1C 10.8 09/06/2012    Assessment: Diabetes control: poor control (HgbA1C >9%) Progress toward A1C goal:  improved Comments: doing much better per family. No hypoglycemia reports, cbg's in 110-130 range per family, using sliding scale. Did not bring meter to visit today but will next time.   Plan: Medications:  continue current medications lantus 25 units and has been using customized scale from hospital that appears to be regulating sugars well with no hypoglycemia however, will have CDE call and touch base in case sliding scale needs to be reduced given no more steroids.  Home glucose monitoring: Frequency: 3 times a day Timing: before meals Instruction/counseling given: reminded to get eye exam, reminded to bring blood glucose meter & log to each visit, reminded to bring medications to each visit, discussed foot care and discussed diet  -needs eye exam

## 2013-03-28 NOTE — Assessment & Plan Note (Addendum)
Dysuria x1 week. Hx of multiple UTI's. Successfully treated in the past with ciprofloxacin.   -U/A and urine cx (no recent culture done) -will treat with ciprofloxacin as complicated UTI x7 days

## 2013-03-28 NOTE — Assessment & Plan Note (Signed)
BP Readings from Last 3 Encounters:  03/28/13 118/68  03/10/13 106/62  03/07/13 157/61   Lab Results  Component Value Date   NA 139 03/05/2013   K 4.3 03/05/2013   CREATININE 0.88 03/05/2013   Assessment: Blood pressure control: controlled Progress toward BP goal:  at goal Comments: compliant with meds  Plan: Medications:  continue current medications norvasc 10mg , coreg 25mg  bid, and lasix 20mg  qd

## 2013-03-29 ENCOUNTER — Telehealth: Payer: Self-pay | Admitting: *Deleted

## 2013-03-29 LAB — URINALYSIS, ROUTINE W REFLEX MICROSCOPIC
BILIRUBIN URINE: NEGATIVE
GLUCOSE, UA: NEGATIVE mg/dL
Ketones, ur: NEGATIVE mg/dL
Nitrite: NEGATIVE
PH: 5 (ref 5.0–8.0)
Protein, ur: 30 mg/dL — AB
Specific Gravity, Urine: 1.016 (ref 1.005–1.030)
Urobilinogen, UA: 0.2 mg/dL (ref 0.0–1.0)

## 2013-03-29 LAB — URINALYSIS, MICROSCOPIC ONLY
Casts: NONE SEEN
Crystals: NONE SEEN
Squamous Epithelial / LPF: NONE SEEN

## 2013-03-29 MED ORDER — CIPROFLOXACIN HCL 500 MG PO TABS: 500.0000 mg | ORAL_TABLET | Freq: Two times a day (BID) | ORAL | Status: DC

## 2013-03-29 NOTE — Telephone Encounter (Signed)
Message copied by Ebbie Latus on Wed Mar 29, 2013  3:29 PM ------      Message from: Wilber Oliphant      Created: Wed Mar 29, 2013 11:36 AM       i have changed the dose of cipro, total 500mg  bid x7 days. Please call family and let them know to pick up this prescription for her urinary tract infection to start today.            Thank you ------

## 2013-03-29 NOTE — Telephone Encounter (Signed)
Called pt's daughter - informed of new rx for Cipro 500mg  BID x 7 days per Dr [name]; voiced understanding.

## 2013-03-29 NOTE — Progress Notes (Signed)
Case discussed with Dr. Qureshi at the time of the visit.  We reviewed the resident's history and exam and pertinent patient test results.  I agree with the assessment, diagnosis, and plan of care documented in the resident's note. 

## 2013-03-30 NOTE — Telephone Encounter (Signed)
Please talk to pharmacy and make sure they change from 3 day prescription to 7 days

## 2013-03-30 NOTE — Telephone Encounter (Signed)
[facility] called.

## 2013-03-31 ENCOUNTER — Encounter: Payer: Self-pay | Admitting: Radiation Oncology

## 2013-03-31 LAB — URINE CULTURE

## 2013-03-31 NOTE — Progress Notes (Signed)
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 Christella Noa  Past or anticipated interventions, if any, per medical oncology:   Dose of Decadron, if applicable: course completed   Recent neurologic symptoms, if any:   Seizures: yes at presentation; keppra 500 mg bid  Headaches: yes  Nausea:   Dizziness/ataxia:   Difficulty with hand coordination:   Focal numbness/weakness:   Visual deficits/changes:   Confusion/Memory deficits:   Painful bone metastases at present, if any:   SAFETY ISSUES:  Prior radiation? Hx of lung ca ????  Pacemaker/ICD?   Possible current pregnancy? NO  Is the patient on methotrexate? NO  Additional Complaints / other details: 80 year old female. HOH. Ambulates with a walker. Speech clear and fluent. Coordination intact per Cabbell's 03/13/2013 noted.

## 2013-04-02 ENCOUNTER — Telehealth: Payer: Self-pay | Admitting: Internal Medicine

## 2013-04-02 NOTE — Telephone Encounter (Signed)
  INTERNAL MEDICINE RESIDENCY PROGRAM After-Hours Telephone Call    Reason for call:   I received a call from [name] at 9:50 PM, 04/02/2013 indicating somnolence.    Pertinent Data:   The patient's daughter notes that the patient has seemed more "sleepy" today than normal.  Symptoms started after taking her morning medications, including her Keppra (which per the daughter typically makes her sleepy), and a prn Vicodin.  The daughter thinks that the combination of these medications has made her somnolent.  Presently, the patient is still able to open her eyes, answer questions, and recognize family members, and is still "moving around like normal", but just seems more tired than usual.  Additionally, the patient sat down on the edge of her bed, and started to slide to the floor, but was assisted by her daughter before actually hitting the floor.  No focal weakness, numbness, or tingling.    Assessment / Plan / Recommendations:   The patient's symptoms of increased fatigue since taking a vicodin and keppra appear to be related to medication side effects.  Given the lack of focal neuro deficits, and intact motor skills and cognition per daughter, I believe we can evaluate this patient in clinic tomorrow.  However, if the patient's symptoms worsen, the daughter is advised to bring the patient to the ED for evaluation.  As always, pt is advised that if symptoms worsen or new symptoms arise, they should go to an urgent care facility or to to ER for further evaluation.    [name] Mates, MD   04/02/2013, 9:50 PM

## 2013-04-02 NOTE — Telephone Encounter (Signed)
Addendum 04/02/13, 11:28 pm - received a return phone call from the patient's daughter.  She notes that the patient isn't "acting herself", citing behaviors such as chewing her food excessively, and "breathing real heavy", though the patient reports no difficulty breathing.  At this point, without examining the patient, it's difficult to say what's going on.  Since the daughter seems concerned, I believe it's reasonable to bring the patient to the ED for evaluation, though she may or may not need admission.  The patient's daughter expressed understanding, and reports she will bring the patient to the ED.  Jae Dire 04/02/2013, 11:31 PM

## 2013-04-03 ENCOUNTER — Encounter (HOSPITAL_COMMUNITY): Payer: Self-pay | Admitting: Emergency Medicine

## 2013-04-03 ENCOUNTER — Encounter: Payer: Self-pay | Admitting: Radiation Oncology

## 2013-04-03 ENCOUNTER — Inpatient Hospital Stay (HOSPITAL_COMMUNITY)
Admission: EM | Admit: 2013-04-03 | Discharge: 2013-04-05 | DRG: 689 | Disposition: A | Discharge status: Home / Self Care | Payer: Medicare Other | Attending: Internal Medicine | Admitting: Internal Medicine

## 2013-04-03 ENCOUNTER — Emergency Department (HOSPITAL_COMMUNITY): Payer: Medicare Other

## 2013-04-03 ENCOUNTER — Encounter (HOSPITAL_COMMUNITY): Payer: Self-pay | Admitting: Internal Medicine

## 2013-04-03 ENCOUNTER — Encounter (INDEPENDENT_AMBULATORY_CARE_PROVIDER_SITE_OTHER): Payer: Medicare Other | Admitting: General Surgery

## 2013-04-03 ENCOUNTER — Ambulatory Visit: Payer: Medicare Other

## 2013-04-03 ENCOUNTER — Ambulatory Visit
Admission: RE | Admit: 2013-04-03 | Discharge: 2013-04-03 | Disposition: A | Discharge status: Home / Self Care | Payer: Medicare Other | Source: Ambulatory Visit | Attending: Radiation Oncology | Admitting: Radiation Oncology

## 2013-04-03 DIAGNOSIS — F329 Major depressive disorder, single episode, unspecified: Secondary | ICD-10-CM | POA: Diagnosis present

## 2013-04-03 DIAGNOSIS — E785 Hyperlipidemia, unspecified: Secondary | ICD-10-CM | POA: Diagnosis present

## 2013-04-03 DIAGNOSIS — I509 Heart failure, unspecified: Secondary | ICD-10-CM | POA: Diagnosis present

## 2013-04-03 DIAGNOSIS — K219 Gastro-esophageal reflux disease without esophagitis: Secondary | ICD-10-CM | POA: Diagnosis present

## 2013-04-03 DIAGNOSIS — R5381 Other malaise: Secondary | ICD-10-CM | POA: Diagnosis not present

## 2013-04-03 DIAGNOSIS — Z7982 Long term (current) use of aspirin: Secondary | ICD-10-CM | POA: Diagnosis not present

## 2013-04-03 DIAGNOSIS — Z794 Long term (current) use of insulin: Secondary | ICD-10-CM | POA: Diagnosis not present

## 2013-04-03 DIAGNOSIS — Z79899 Other long term (current) drug therapy: Secondary | ICD-10-CM

## 2013-04-03 DIAGNOSIS — R404 Transient alteration of awareness: Secondary | ICD-10-CM | POA: Diagnosis not present

## 2013-04-03 DIAGNOSIS — F3289 Other specified depressive episodes: Secondary | ICD-10-CM | POA: Diagnosis present

## 2013-04-03 DIAGNOSIS — I5022 Chronic systolic (congestive) heart failure: Secondary | ICD-10-CM | POA: Diagnosis present

## 2013-04-03 DIAGNOSIS — G9340 Encephalopathy, unspecified: Secondary | ICD-10-CM | POA: Diagnosis present

## 2013-04-03 DIAGNOSIS — E1140 Type 2 diabetes mellitus with diabetic neuropathy, unspecified: Secondary | ICD-10-CM | POA: Diagnosis present

## 2013-04-03 DIAGNOSIS — I5020 Unspecified systolic (congestive) heart failure: Secondary | ICD-10-CM | POA: Diagnosis present

## 2013-04-03 DIAGNOSIS — M129 Arthropathy, unspecified: Secondary | ICD-10-CM | POA: Diagnosis present

## 2013-04-03 DIAGNOSIS — E1159 Type 2 diabetes mellitus with other circulatory complications: Secondary | ICD-10-CM | POA: Diagnosis present

## 2013-04-03 DIAGNOSIS — D473 Essential (hemorrhagic) thrombocythemia: Secondary | ICD-10-CM | POA: Diagnosis present

## 2013-04-03 DIAGNOSIS — D509 Iron deficiency anemia, unspecified: Secondary | ICD-10-CM | POA: Diagnosis present

## 2013-04-03 DIAGNOSIS — E1165 Type 2 diabetes mellitus with hyperglycemia: Secondary | ICD-10-CM

## 2013-04-03 DIAGNOSIS — D320 Benign neoplasm of cerebral meninges: Secondary | ICD-10-CM

## 2013-04-03 DIAGNOSIS — N320 Bladder-neck obstruction: Secondary | ICD-10-CM | POA: Diagnosis present

## 2013-04-03 DIAGNOSIS — Z86011 Personal history of benign neoplasm of the brain: Secondary | ICD-10-CM | POA: Diagnosis not present

## 2013-04-03 DIAGNOSIS — R0989 Other specified symptoms and signs involving the circulatory and respiratory systems: Secondary | ICD-10-CM | POA: Diagnosis not present

## 2013-04-03 DIAGNOSIS — E876 Hypokalemia: Secondary | ICD-10-CM | POA: Diagnosis present

## 2013-04-03 DIAGNOSIS — R21 Rash and other nonspecific skin eruption: Secondary | ICD-10-CM | POA: Diagnosis present

## 2013-04-03 DIAGNOSIS — I152 Hypertension secondary to endocrine disorders: Secondary | ICD-10-CM | POA: Diagnosis present

## 2013-04-03 DIAGNOSIS — Z87891 Personal history of nicotine dependence: Secondary | ICD-10-CM

## 2013-04-03 DIAGNOSIS — R4182 Altered mental status, unspecified: Secondary | ICD-10-CM | POA: Diagnosis not present

## 2013-04-03 DIAGNOSIS — IMO0001 Reserved for inherently not codable concepts without codable children: Secondary | ICD-10-CM | POA: Diagnosis not present

## 2013-04-03 DIAGNOSIS — G40909 Epilepsy, unspecified, not intractable, without status epilepticus: Secondary | ICD-10-CM | POA: Diagnosis present

## 2013-04-03 DIAGNOSIS — Z85118 Personal history of other malignant neoplasm of bronchus and lung: Secondary | ICD-10-CM

## 2013-04-03 DIAGNOSIS — E119 Type 2 diabetes mellitus without complications: Secondary | ICD-10-CM | POA: Diagnosis present

## 2013-04-03 DIAGNOSIS — Z833 Family history of diabetes mellitus: Secondary | ICD-10-CM | POA: Diagnosis not present

## 2013-04-03 DIAGNOSIS — G9389 Other specified disorders of brain: Secondary | ICD-10-CM | POA: Diagnosis not present

## 2013-04-03 DIAGNOSIS — Z9181 History of falling: Secondary | ICD-10-CM

## 2013-04-03 DIAGNOSIS — N390 Urinary tract infection, site not specified: Secondary | ICD-10-CM | POA: Diagnosis not present

## 2013-04-03 DIAGNOSIS — Z8673 Personal history of transient ischemic attack (TIA), and cerebral infarction without residual deficits: Secondary | ICD-10-CM

## 2013-04-03 DIAGNOSIS — I10 Essential (primary) hypertension: Secondary | ICD-10-CM | POA: Diagnosis not present

## 2013-04-03 DIAGNOSIS — Z8249 Family history of ischemic heart disease and other diseases of the circulatory system: Secondary | ICD-10-CM | POA: Diagnosis not present

## 2013-04-03 DIAGNOSIS — R0602 Shortness of breath: Secondary | ICD-10-CM | POA: Diagnosis not present

## 2013-04-03 DIAGNOSIS — I5032 Chronic diastolic (congestive) heart failure: Secondary | ICD-10-CM | POA: Diagnosis present

## 2013-04-03 DIAGNOSIS — C3490 Malignant neoplasm of unspecified part of unspecified bronchus or lung: Secondary | ICD-10-CM | POA: Diagnosis not present

## 2013-04-03 DIAGNOSIS — K5900 Constipation, unspecified: Secondary | ICD-10-CM | POA: Diagnosis present

## 2013-04-03 DIAGNOSIS — J189 Pneumonia, unspecified organism: Secondary | ICD-10-CM | POA: Diagnosis not present

## 2013-04-03 LAB — URINE MICROSCOPIC-ADD ON

## 2013-04-03 LAB — CBC WITH DIFFERENTIAL/PLATELET
Basophils Absolute: 0 10*3/uL (ref 0.0–0.1)
Basophils Relative: 0 % (ref 0–1)
Eosinophils Absolute: 0.1 10*3/uL (ref 0.0–0.7)
Eosinophils Relative: 0 % (ref 0–5)
HEMATOCRIT: 25.9 % — AB (ref 36.0–46.0)
HEMOGLOBIN: 8.2 g/dL — AB (ref 12.0–15.0)
LYMPHS PCT: 20 % (ref 12–46)
Lymphs Abs: 2.2 10*3/uL (ref 0.7–4.0)
MCH: 25.7 pg — ABNORMAL LOW (ref 26.0–34.0)
MCHC: 31.7 g/dL (ref 30.0–36.0)
MCV: 81.2 fL (ref 78.0–100.0)
MONOS PCT: 10 % (ref 3–12)
Monocytes Absolute: 1.1 10*3/uL — ABNORMAL HIGH (ref 0.1–1.0)
Neutro Abs: 7.9 10*3/uL — ABNORMAL HIGH (ref 1.7–7.7)
Neutrophils Relative %: 70 % (ref 43–77)
PLATELETS: 546 10*3/uL — AB (ref 150–400)
RBC: 3.19 MIL/uL — AB (ref 3.87–5.11)
RDW: 16.6 % — ABNORMAL HIGH (ref 11.5–15.5)
WBC: 11.3 10*3/uL — AB (ref 4.0–10.5)

## 2013-04-03 LAB — CBC
HEMATOCRIT: 26.6 % — AB (ref 36.0–46.0)
HEMOGLOBIN: 8.3 g/dL — AB (ref 12.0–15.0)
MCH: 25.5 pg — AB (ref 26.0–34.0)
MCHC: 31.2 g/dL (ref 30.0–36.0)
MCV: 81.6 fL (ref 78.0–100.0)
Platelets: 548 10*3/uL — ABNORMAL HIGH (ref 150–400)
RBC: 3.26 MIL/uL — AB (ref 3.87–5.11)
RDW: 16.9 % — ABNORMAL HIGH (ref 11.5–15.5)
WBC: 11.9 10*3/uL — ABNORMAL HIGH (ref 4.0–10.5)

## 2013-04-03 LAB — URINALYSIS, ROUTINE W REFLEX MICROSCOPIC
BILIRUBIN URINE: NEGATIVE
Glucose, UA: NEGATIVE mg/dL
Ketones, ur: NEGATIVE mg/dL
NITRITE: NEGATIVE
Protein, ur: 100 mg/dL — AB
Specific Gravity, Urine: 1.026 (ref 1.005–1.030)
UROBILINOGEN UA: 0.2 mg/dL (ref 0.0–1.0)
pH: 5 (ref 5.0–8.0)

## 2013-04-03 LAB — URINE CULTURE
Colony Count: NO GROWTH
Culture: NO GROWTH

## 2013-04-03 LAB — COMPREHENSIVE METABOLIC PANEL
ALK PHOS: 79 U/L (ref 39–117)
ALT: 10 U/L (ref 0–35)
AST: 11 U/L (ref 0–37)
Albumin: 2.5 g/dL — ABNORMAL LOW (ref 3.5–5.2)
BUN: 16 mg/dL (ref 6–23)
CO2: 26 mEq/L (ref 19–32)
Calcium: 8.4 mg/dL (ref 8.4–10.5)
Chloride: 104 mEq/L (ref 96–112)
Creatinine, Ser: 0.84 mg/dL (ref 0.50–1.10)
GFR calc non Af Amer: 65 mL/min — ABNORMAL LOW (ref >90)
GFR, EST AFRICAN AMERICAN: 76 mL/min — AB (ref >90)
GLUCOSE: 115 mg/dL — AB (ref 70–99)
Potassium: 3.5 mEq/L — ABNORMAL LOW (ref 3.7–5.3)
SODIUM: 143 meq/L (ref 137–147)
Total Bilirubin: <0.2 mg/dL — ABNORMAL LOW (ref 0.3–1.2)
Total Protein: 6.3 g/dL (ref 6.0–8.3)

## 2013-04-03 LAB — GLUCOSE, CAPILLARY
GLUCOSE-CAPILLARY: 196 mg/dL — AB (ref 70–99)
Glucose-Capillary: 161 mg/dL — ABNORMAL HIGH (ref 70–99)
Glucose-Capillary: 178 mg/dL — ABNORMAL HIGH (ref 70–99)

## 2013-04-03 LAB — TROPONIN I

## 2013-04-03 LAB — PHOSPHORUS: PHOSPHORUS: 2.5 mg/dL (ref 2.3–4.6)

## 2013-04-03 LAB — MAGNESIUM: Magnesium: 1.8 mg/dL (ref 1.5–2.5)

## 2013-04-03 MED ORDER — ALBUTEROL SULFATE (2.5 MG/3ML) 0.083% IN NEBU: 2.5000 mg | INHALATION_SOLUTION | Freq: Four times a day (QID) | RESPIRATORY_TRACT | Status: DC | PRN

## 2013-04-03 MED ORDER — CARVEDILOL 25 MG PO TABS
25.0000 mg | ORAL_TABLET | Freq: Two times a day (BID) | ORAL | Status: DC
  Administered 2013-04-03 – 2013-04-05 (×4): 25 mg via ORAL
  Filled 2013-04-03 (×6): qty 1

## 2013-04-03 MED ORDER — LEVETIRACETAM 500 MG PO TABS
500.0000 mg | ORAL_TABLET | Freq: Two times a day (BID) | ORAL | Status: DC
  Administered 2013-04-03 – 2013-04-05 (×5): 500 mg via ORAL
  Filled 2013-04-03 (×7): qty 1

## 2013-04-03 MED ORDER — ASPIRIN 81 MG PO CHEW
81.0000 mg | CHEWABLE_TABLET | Freq: Every day | ORAL | Status: DC
  Administered 2013-04-03 – 2013-04-05 (×3): 81 mg via ORAL
  Filled 2013-04-03 (×3): qty 1

## 2013-04-03 MED ORDER — AMLODIPINE BESYLATE 10 MG PO TABS
10.0000 mg | ORAL_TABLET | Freq: Every day | ORAL | Status: DC
  Administered 2013-04-04: 10 mg via ORAL
  Filled 2013-04-03: qty 1

## 2013-04-03 MED ORDER — INSULIN ASPART 100 UNIT/ML ~~LOC~~ SOLN
0.0000 [IU] | Freq: Three times a day (TID) | SUBCUTANEOUS | Status: DC
  Administered 2013-04-03 (×2): 2 [IU] via SUBCUTANEOUS
  Administered 2013-04-05 (×2): 1 [IU] via SUBCUTANEOUS

## 2013-04-03 MED ORDER — SIMVASTATIN 20 MG PO TABS
20.0000 mg | ORAL_TABLET | Freq: Every day | ORAL | Status: DC
  Administered 2013-04-03 – 2013-04-04 (×2): 20 mg via ORAL
  Filled 2013-04-03 (×3): qty 1

## 2013-04-03 MED ORDER — CEFTRIAXONE SODIUM 1 G IJ SOLR
1.0000 g | Freq: Once | INTRAMUSCULAR | Status: AC
Start: 2013-04-03 — End: 2013-04-03
  Administered 2013-04-03: 1 g via INTRAVENOUS
  Filled 2013-04-03: qty 10

## 2013-04-03 MED ORDER — ASPIRIN 81 MG PO TABS: 81.0000 mg | ORAL_TABLET | Freq: Every day | ORAL | Status: DC

## 2013-04-03 MED ORDER — POTASSIUM CHLORIDE CRYS ER 20 MEQ PO TBCR
40.0000 meq | EXTENDED_RELEASE_TABLET | ORAL | Status: AC
  Administered 2013-04-03 (×2): 40 meq via ORAL
  Filled 2013-04-03 (×2): qty 2

## 2013-04-03 MED ORDER — DEXTROSE 5 % IV SOLN
1.0000 g | INTRAVENOUS | Status: DC
  Administered 2013-04-04: 1 g via INTRAVENOUS
  Filled 2013-04-03: qty 10

## 2013-04-03 MED ORDER — SODIUM CHLORIDE 0.9 % IV SOLN
Freq: Once | INTRAVENOUS | Status: AC
  Administered 2013-04-03: 02:00:00 via INTRAVENOUS

## 2013-04-03 MED ORDER — FUROSEMIDE 20 MG PO TABS
20.0000 mg | ORAL_TABLET | Freq: Every day | ORAL | Status: DC
  Administered 2013-04-04: 20 mg via ORAL
  Filled 2013-04-03: qty 1

## 2013-04-03 MED ORDER — PANTOPRAZOLE SODIUM 20 MG PO TBEC
20.0000 mg | DELAYED_RELEASE_TABLET | Freq: Every day | ORAL | Status: DC
  Administered 2013-04-03 – 2013-04-05 (×3): 20 mg via ORAL
  Filled 2013-04-03 (×3): qty 1

## 2013-04-03 MED ORDER — INSULIN GLARGINE 100 UNIT/ML ~~LOC~~ SOLN
5.0000 [IU] | Freq: Every day | SUBCUTANEOUS | Status: DC
Start: 2013-04-03 — End: 2013-04-05
  Administered 2013-04-03 – 2013-04-04 (×2): 5 [IU] via SUBCUTANEOUS
  Filled 2013-04-03 (×3): qty 0.05

## 2013-04-03 MED ORDER — FLUCONAZOLE 150 MG PO TABS
150.0000 mg | ORAL_TABLET | Freq: Once | ORAL | Status: DC
  Filled 2013-04-03: qty 1

## 2013-04-03 NOTE — ED Notes (Signed)
IV team paged.  

## 2013-04-03 NOTE — ED Notes (Signed)
IV team nurse at bedside. 

## 2013-04-03 NOTE — ED Notes (Signed)
Dr. Nanavati at bedside 

## 2013-04-03 NOTE — Evaluation (Signed)
Physical Therapy Evaluation Patient Details Name: [name] MRN: [id number] DOB: 1933-03-09 Today's Date: 04/03/2013 Time: 1443-1540 PT Time Calculation (min): 21 min  PT Assessment / Plan / Recommendation History of Present Illness  [name] is a 80 yo woman with history of HTN, DM, CVA, CHF, meningioma s/p craniotomy with tumor excision (03/02/13) presented on 04/03/13 with complaints of AMS.  Per ED reports, daughter who was at bedside noted patient to be somnolent, confused, weak (not ambulating) & sluggish on the day prior to admission.  She seems to complain of vague pain, but she has not been able to localize.  Clinical Impression  Pt adm due to the above. Pt presents with decreased independence with gait and mobility secondary to deficits indicated below. Pt with recent fall at home. Pt to benefit from skilled acute PT to address deficits listed below and increase mobility prior to returning home. No family present to determine cognition baseline; pt confused and delayed responses during session. Will be able to return home with family if they can provide 24/7 (A) for all mobility. Pt is a fall risk.   PT Assessment  Patient needs continued PT services    Follow Up Recommendations  Supervision/Assistance - 24 hour;Home health PT    Does the patient have the potential to tolerate intense rehabilitation      Barriers to Discharge        Equipment Recommendations  None recommended by PT    Recommendations for Other Services OT consult   Frequency Min 3X/week    Precautions / Restrictions Precautions Precautions: Fall Restrictions Weight Bearing Restrictions: No   Pertinent Vitals/Pain No complaints.       Mobility  Bed Mobility Overal bed mobility: Modified Independent General bed mobility comments: effortful; pt relies on handrails  Transfers Overall transfer level: Needs assistance Equipment used: Rolling walker (2 wheeled) Transfers: Sit to/from Stand Sit  to Stand: Min assist General transfer comment: pt unsteady with initial sit to stand; (A) to maintain balance; cues for hand placement and sequencing Ambulation/Gait Ambulation/Gait assistance: Min assist Ambulation Distance (Feet): 25 Feet Assistive device: Rolling walker (2 wheeled) Gait Pattern/deviations: Decreased stride length;Step-through pattern;Narrow base of support;Shuffle Gait velocity: very decreased Gait velocity interpretation: <1.8 ft/sec, indicative of risk for recurrent falls General Gait Details: pt ambulates at very slowed and delayed speed; cues to increase stride length; pt unable to today; requires (A) to manage RW and maintain balance          PT Diagnosis: Abnormality of gait;Altered mental status  PT Problem List: Decreased strength;Decreased activity tolerance;Decreased balance;Decreased mobility;Decreased knowledge of use of DME;Decreased cognition;Decreased safety awareness;Impaired sensation PT Treatment Interventions: DME instruction;Gait training;Stair training;Functional mobility training;Therapeutic activities;Therapeutic exercise;Balance training;Neuromuscular re-education;Patient/family education     PT Goals(Current goals can be found in the care plan section) Acute Rehab PT Goals Patient Stated Goal: none stated PT Goal Formulation: With patient Time For Goal Achievement: 04/17/13 Potential to Achieve Goals: Fair  Visit Information  Last PT Received On: 04/03/13 Assistance Needed: +1 History of Present Illness: [name] is a 80 yo woman with history of HTN, DM, CVA, CHF, meningioma s/p craniotomy with tumor excision (03/02/13) presented on 04/03/13 with complaints of AMS.  Per ED reports, daughter who was at bedside noted patient to be somnolent, confused, weak (not ambulating) & sluggish on the day prior to admission.  She seems to complain of vague pain, but she has not been able to localize.       Prior Functioning  Home  Living Family/patient expects to be discharged to:: Private residence Living Arrangements: Children Available Help at Discharge: Family;Available 24 hours/day;Other (Comment) Type of Home: House Home Access: Stairs to enter [employer] of Steps: 5 Entrance Stairs-Rails: Right Home Layout: One level Home Equipment: Walker - 2 wheels;Bedside commode;Cane - single point Additional Comments: Pt able to give some history of PLOF and home setup with incr time; also able to use most recent evaluation for information  Prior Function Level of Independence: Needs assistance Gait / Transfers Assistance Needed: ambulates with RW ADL's / Homemaking Assistance Needed: reports grandaughter gives her a bath; reports she can dress herself  Communication Communication: No difficulties (with incr time) Dominant Hand: Left    Cognition  Cognition Arousal/Alertness: Awake/alert Behavior During Therapy: Flat affect Overall Cognitive Status: Impaired/Different from baseline Area of Impairment: Orientation;Problem solving;Following commands;Safety/judgement Orientation Level: Disoriented to;Situation Memory: Decreased short-term memory Following Commands: Follows one step commands with increased time Safety/Judgement: Decreased awareness of deficits Problem Solving: Slow processing;Decreased initiation;Requires verbal cues;Requires tactile cues General Comments: very delayed with answering questions and responding to one step commands; no family present to determine baseline     Extremity/Trunk Assessment Upper Extremity Assessment Upper Extremity Assessment: Defer to OT evaluation Lower Extremity Assessment Lower Extremity Assessment: Generalized weakness Cervical / Trunk Assessment Cervical / Trunk Assessment: Normal   Balance Balance Overall balance assessment: Needs assistance;History of Falls Sitting-balance support: Bilateral upper extremity supported;Feet supported Sitting  balance-Leahy Scale: Fair Sitting balance - Comments: supported by bil UEs; leaning posteriorly at times Postural control: Posterior lean Standing balance support: During functional activity;Bilateral upper extremity supported Standing balance-Leahy Scale: Poor Standing balance comment: bil UE supported by RW; min (A)  + sway  End of Session PT - End of Session Equipment Utilized During Treatment: Gait belt Activity Tolerance: Patient tolerated treatment well Patient left: in bed;with bed alarm set;with call bell/phone within reach Nurse Communication: Mobility status;Precautions  GP     Gustavus Bryant, [place] [phone] 04/03/2013, 12:01 PM

## 2013-04-03 NOTE — ED Provider Notes (Signed)
CSN: [id number]     Arrival date & time 04/03/13  0038 History   First MD Initiated Contact with Patient 04/03/13 0102     Chief Complaint  Patient presents with  . Altered Mental Status     (Consider location/radiation/quality/duration/timing/severity/associated sxs/prior Treatment) HPI Comments: 80 y.o. African American female with PMH of HTN, poorly controlled DM2, CVA, CHF, lung mass, meningioma s/p craniotomy tumor excision comes in with cc of altered mental status. Per daughters, patient has been somnolent, confused and sluggish all day. Also weak - and not ambulating. They have heard her complain of pain - but is not giving any specific answers as to where. No falls. No fevers.   Patient is a 80 y.o. female presenting with altered mental status. The history is provided by medical records, a relative and the patient.  Altered Mental Status Associated symptoms: no fever, no rash, no seizures and no vomiting     Past Medical History  Diagnosis Date  . Diabetes mellitus 2007    HgA1C (02/20/2010) = 9.2, HgA1C (03/20/2009) = 12.1  . Hyperlipidemia   . Hypertension   . GERD (gastroesophageal reflux disease)   . CVA (cerebrovascular accident) 9323     right embolic stroke, no residual deficits  . Diverticulitis   . CVA (cerebral infarction) 7-yrs ago  . Arthritis   . Dysphagia   . CHF (congestive heart failure)   . DIABETES MELLITUS, TYPE II 12/04/2005  . HYPERLIPIDEMIA 12/04/2005  . HYPERTENSION 12/04/2005  . GERD 12/04/2005  . ARTHRITIS, KNEE 03/24/2006  . Lung mass 07/08/2010  . Meningioma 07/21/2010  . Hemangioma of liver 12/02/2010  . Depression 12/02/2010  . Adenocarcinoma of lung      Right upper lobe adenocarcinoma.    Past Surgical History  Procedure Laterality Date  . Abdominal hysterectomy    . Video bronchoscope.  12/29/2007    [place]  . Wide excision of left upper back mass.    . Extracapsular cataract extraction with intraocular      lens implantation.  .  Right vats,right thoracotomy,right lower lobectomy with node dissection    . Incision and drainage perirectal abscess N/A 01/19/2013    Procedure: IRRIGATION AND DEBRIDEMENT PERIRECTAL ABSCESS;  Surgeon: Zenovia Jarred, MD;  Location: [facility];  Service: General;  Laterality: N/A;  . Craniotomy N/A 03/02/2013    Procedure: CRANIOTOMY TUMOR EXCISION;  Surgeon: Winfield Cunas, MD;  Location: [facility];  Service: Neurosurgery;  Laterality: N/A;  Bifrontal Craniotomy for tumor   Family History  Problem Relation Age of Onset  . Hyperlipidemia Brother   . Hypertension Brother   . Diabetes Brother    History  Substance Use Topics  . Smoking status: Former Smoker    Types: Cigarettes    Quit date: 02/17/2000  . Smokeless tobacco: Never Used  . Alcohol Use: No   OB History   Grav Para Term Preterm Abortions TAB SAB Ect Mult Living                 Review of Systems  Unable to perform ROS: Mental status change  Constitutional: Positive for activity change. Negative for fever.  Gastrointestinal: Negative for vomiting and diarrhea.  Skin: Negative for rash.  Neurological: Negative for seizures.      Allergies  Review of patient's allergies indicates no known allergies.  Home Medications   Current Outpatient Rx  Name  Route  Sig  Dispense  Refill  . amLODipine (NORVASC) 10 MG tablet  Oral   Take 1 tablet (10 mg total) by mouth daily.   30 tablet   3   . aspirin 81 MG tablet   Oral   Take 81 mg by mouth daily.          . carvedilol (COREG) 25 MG tablet   Oral   Take 1 tablet (25 mg total) by mouth 2 (two) times daily with a meal.   180 tablet   4   . ciprofloxacin (CIPRO) 500 MG tablet   Oral   Take 1 tablet (500 mg total) by mouth 2 (two) times daily.   14 tablet   0   . docusate sodium (COLACE) 100 MG capsule   Oral   Take 1 capsule (100 mg total) by mouth daily as needed.   30 capsule   1   . furosemide (LASIX) 20 MG tablet   Oral   Take 1 tablet (20 mg  total) by mouth daily.   90 tablet   0   . HYDROcodone-acetaminophen (NORCO/VICODIN) 5-325 MG per tablet   Oral   Take 1 tablet by mouth 2 (two) times daily as needed for moderate pain. Do not fill before 04/16/2013   60 tablet   0     Do not fill before 04/16/13   . insulin aspart (NOVOLOG FLEXPEN) 100 UNIT/ML FlexPen      Take three times with meals  <70    2 units if going to eat- skip if going to skip meal   71-140    4 units Novolog   141-210  6 units Novolog   211-280  8 units   15 mL   11   . Insulin Glargine (LANTUS SOLOSTAR) 100 UNIT/ML Solostar Pen   Subcutaneous   Inject 25 Units into the skin at bedtime.   10 mL   5   . levETIRAcetam (KEPPRA) 500 MG tablet   Oral   Take 1 tablet (500 mg total) by mouth 2 (two) times daily.   60 tablet   11   . Loratadine 10 MG CAPS   Oral   Take 1 capsule (10 mg total) by mouth daily.   30 each   0   . pantoprazole (PROTONIX) 20 MG tablet   Oral   Take 20 mg by mouth daily.         . simvastatin (ZOCOR) 20 MG tablet   Oral   Take 20 mg by mouth daily.         Marland Kitchen triamcinolone ointment (KENALOG) 0.1 %   Topical   Apply 1 application topically daily as needed (for skin irritaion).         Marland Kitchen albuterol (PROVENTIL HFA;VENTOLIN HFA) 108 (90 BASE) MCG/ACT inhaler   Inhalation   Inhale 2 puffs into the lungs every 4 (four) hours as needed for wheezing.   3 Inhaler   1   . albuterol (PROVENTIL) (2.5 MG/3ML) 0.083% nebulizer solution   Nebulization   Take 3 mLs (2.5 mg total) by nebulization every 6 (six) hours as needed for wheezing.   75 mL   12   . Blood Glucose Monitoring Suppl (ACCU-CHEK AVIVA PLUS) W/DEVICE KIT   Does not apply   1 each by Does not apply route 3 (three) times daily. dx code 250.00 insulin requiring   1 kit   1   . [employer] LANCETS MICRO THIN 33G MISC      Check blood sugar 3 times a day before meals dx  code 250.00 insulin requiring   100 each   12   . glucose blood (ACCU-CHEK AVIVA PLUS)  test strip      Check blood sugar 3 times a day before meals dx code 250.00 insulin requiring   100 each   12   . Lancet Devices (ACCU-CHEK SOFTCLIX) lancets      Check blood sugar 3 times a day before meals dx code 250.00 insulin requiring   100 each   12    BP 148/64  Pulse 95  Temp(Src) 99.8 F (37.7 C) (Oral)  SpO2 97% Physical Exam  Nursing note and vitals reviewed. Constitutional: She appears well-developed and well-nourished.  HENT:  Head: Normocephalic and atraumatic.  Eyes: EOM are normal. Pupils are equal, round, and reactive to light.  Neck: Neck supple. No JVD present.  Cardiovascular: Normal rate, regular rhythm and normal heart sounds.   Pulmonary/Chest: Effort normal. No respiratory distress.  Abdominal: Soft. She exhibits no distension. There is no tenderness. There is no rebound and no guarding.  Neurological: She is alert. No cranial nerve deficit.  Skin: Skin is warm and dry.    ED Course  Procedures (including critical care time) Labs Review Labs Reviewed  CBC WITH DIFFERENTIAL - Abnormal; Notable for the following:    WBC 11.3 (*)    RBC 3.19 (*)    Hemoglobin 8.2 (*)    HCT 25.9 (*)    MCH 25.7 (*)    RDW 16.6 (*)    Platelets 546 (*)    Neutro Abs 7.9 (*)    Monocytes Absolute 1.1 (*)    All other components within normal limits  COMPREHENSIVE METABOLIC PANEL - Abnormal; Notable for the following:    Potassium 3.5 (*)    Glucose, Bld 115 (*)    Albumin 2.5 (*)    Total Bilirubin <0.2 (*)    GFR calc non Af Amer 65 (*)    GFR calc Af Amer 76 (*)    All other components within normal limits  URINALYSIS, ROUTINE W REFLEX MICROSCOPIC - Abnormal; Notable for the following:    APPearance TURBID (*)    Hgb urine dipstick MODERATE (*)    Protein, ur 100 (*)    Leukocytes, UA LARGE (*)    All other components within normal limits  URINE MICROSCOPIC-ADD ON - Abnormal; Notable for the following:    Bacteria, UA FEW (*)    All other components  within normal limits  URINE CULTURE  MAGNESIUM  PHOSPHORUS  TROPONIN I   Imaging Review Dg Chest 2 View  04/03/2013   CLINICAL DATA:  ALTERED MENTAL STATUS, RIGHT LUNG CANCER  EXAM: CHEST  2 VIEW  COMPARISON:  DG CHEST 1V PORT dated 03/02/2013; CT CHEST W/CM dated 05/07/2012  FINDINGS: MILDLY ENLARGED CARDIAC SILHOUETTE. THERE IS ATELECTASIS IN THE RIGHT MID LUNG UNCHANGED FROM PRIOR. NO FOCAL CONSOLIDATION. NO PNEUMOTHORAX. NO PLEURAL FLUID.  IMPRESSION: 1. No clear acute findings. 2. Right lung atelectasis.   Electronically Signed   By: Suzy Bouchard M.D.   On: 04/03/2013 01:45   Ct Head Wo Contrast  04/03/2013   CLINICAL DATA:  Altered mental status  EXAM: CT HEAD WITHOUT CONTRAST  TECHNIQUE: Contiguous axial images were obtained from the base of the skull through the vertex without intravenous contrast.  COMPARISON:  Prior MRI from 02/26/2013  FINDINGS: Postoperative changes from recent right frontal craniotomy with resection of large right frontal meningioma again seen. A hypodense extra-axial fluid collection measuring 1 cm  in diameter overlies the operative site subjacent to the craniotomy bone flap. A few scattered foci of pneumocephalus persists within this region. There is encephalomalacia within the subjacent right frontal lobe. Small linear hyperdensity within the lateral right frontal lobe is likely related to beam hardening artifact from overlying postsurgical changes at the anterior cranial vault. Previously seen right-to-left midline shift has resolved. Ventricles are stable without evidence of hydrocephalus.  Remote right frontal and right basal ganglia infarcts again noted. No new large vessel territory infarct. No acute intracranial hemorrhage.  No acute abnormality seen within the calvarium. Orbits are normal. Paranasal sinuses and mastoid air cells are clear.  IMPRESSION: 1. Postoperative changes from recent right frontal craniotomy with resection of previously identified right  frontal meningioma. 2. Hypodense subdural fluid collection measuring 1 cm in maximal diameter subjacent to the craniotomy bone flap overlying the right frontal lobe. This collection is within normal limits for normal expected postoperative changes. 3. No acute intracranial hemorrhage or infarct identified. 4. Remote right frontal and right basal ganglia lacunar infarcts.   Electronically Signed   By: Jeannine Boga M.D.   On: 04/03/2013 02:48    EKG Interpretation   None       MDM   Final diagnoses:  UTI (lower urinary tract infection)  Altered mental status    Date: 04/03/2013  Rate: 102   Rhythm: sinus tachycardia  QRS Axis: left  Intervals: normal  ST/T Wave abnormalities: nonspecific ST/T changes  Conduction Disutrbances:nonspecific intraventricular conduction delay  Narrative Interpretation:   Old EKG Reviewed: unchanged   DDx: Sepsis syndrome ACS syndrome DKA ICH Stroke Infection - pneumonia/UTI/Cellulitis PE Dehydration Electrolyte abnormality Tox syndrome Seizure Encephalopathy  Pt comes in with cc of AMS. Pt is aox2, definitely a little somnolent, incomplete neuro assessment due to lack of participation - but nothing truly focal on gross.  CT head is negative. EKG and Cardiac enzymes are fine. No fevers in the ED, pt is tachycardic, with mild WC elevation and a dirty UA. Suspect UTI as the cause for AMS.  Will admit. Might need Neuro consult, if not improved. No nuchal rigidity appreciated on the exam - but given recent brain surgery - she is at risk of brain infection as well - although currently i think UTI is more likely.  We will give her ceftriaxone and culture the urine.       Varney Biles, MD 04/03/13 [phone]

## 2013-04-03 NOTE — ED Notes (Signed)
Pt placed on 3L [place]. O2-94%.

## 2013-04-03 NOTE — H&P (Signed)
Date: 04/03/2013               Patient Name:  [name] MRN: [id number]  DOB: [birth year] Age / Sex: 80 y.o., female   PCP: Jerene Pitch, MD         Medical Service: Internal Medicine Teaching Service         Attending Physician: Dr. Linus Salmons    First Contact: Dr. Gordy Levan Pager: [phone]  Second Contact: Dr. Alice Rieger Pager: [phone]       After Hours (After 5p/  First Contact Pager: [phone]  weekends / holidays): Second Contact Pager: [phone]   Chief Complaint: AMS   History of Present Illness:  Ms. Arna is a 80 yo woman with history of HTN, DM, CVA, CHF, meningioma s/p craniotomy with tumor excision (03/02/13) presented on 04/03/13 with complaints of AMS.  Per ED reports, daughter who was at bedside noted patient to be somnolent, confused, weak (not ambulating) & sluggish on the day prior to admission.  She seems to complain of vague pain, but she has not been able to localize.  No falls. No fevers.  I examined patient on the floor, daughter was not at bedside (I called number on file, no answer). Upon my questioning, she endorsed dysuria & urinary incontinence (unable to describe incontinence further), but denied any pain. She also denied HA, dizziness, difficulty swallowing, chest pain, heart palpitations, difficulty breathing, abdominal pain, diarrhea, constipation (reported last BM was Saturday).    Patient admits to living with her daughter, who helps her with most ADLs including bathing, dressing, med mgmt.  She walks with a walker, and thinks she had a recent fall in the last week, but did not hit her head.   To note, patient was seen in clinic on 03/28/13 with complaints of dysuria x 1 week.  She has had history of multiple UTIs in the past, at noted similar symptoms - PCP subsequently ordered cipro 559m bid x 7d.   Patient was most recently hospitalized 02/25/13-03/07/13 due to new onset seizures due to a 7cm cerebral right atypical meningioma requring craniotomy for tumor  excision.   In the ED, she was treated with 2g rocephin.    Review of Systems: Limited by patient, pertinent positives/negatives per HPI  Meds: No current facility-administered medications for this encounter.   Current Outpatient Prescriptions  Medication Sig Dispense Refill  . amLODipine (NORVASC) 10 MG tablet Take 1 tablet (10 mg total) by mouth daily.  30 tablet  3  . aspirin 81 MG tablet Take 81 mg by mouth daily.       . carvedilol (COREG) 25 MG tablet Take 1 tablet (25 mg total) by mouth 2 (two) times daily with a meal.  180 tablet  4  . ciprofloxacin (CIPRO) 500 MG tablet Take 1 tablet (500 mg total) by mouth 2 (two) times daily.  14 tablet  0  . docusate sodium (COLACE) 100 MG capsule Take 1 capsule (100 mg total) by mouth daily as needed.  30 capsule  1  . furosemide (LASIX) 20 MG tablet Take 1 tablet (20 mg total) by mouth daily.  90 tablet  0  . [START ON 04/16/2013] HYDROcodone-acetaminophen (NORCO/VICODIN) 5-325 MG per tablet Take 1 tablet by mouth 2 (two) times daily as needed for moderate pain. Do not fill before 04/16/2013  60 tablet  0  . insulin aspart (NOVOLOG FLEXPEN) 100 UNIT/ML FlexPen Take three times with meals  <70    2 units if going to eat-  skip if going to skip meal   71-140    4 units Novolog   141-210  6 units Novolog   211-280  8 units  15 mL  11  . Insulin Glargine (LANTUS SOLOSTAR) 100 UNIT/ML Solostar Pen Inject 25 Units into the skin at bedtime.  10 mL  5  . levETIRAcetam (KEPPRA) 500 MG tablet Take 1 tablet (500 mg total) by mouth 2 (two) times daily.  60 tablet  11  . Loratadine 10 MG CAPS Take 1 capsule (10 mg total) by mouth daily.  30 each  0  . pantoprazole (PROTONIX) 20 MG tablet Take 20 mg by mouth daily.      . simvastatin (ZOCOR) 20 MG tablet Take 20 mg by mouth daily.      Marland Kitchen triamcinolone ointment (KENALOG) 0.1 % Apply 1 application topically daily as needed (for skin irritaion).      Marland Kitchen albuterol (PROVENTIL HFA;VENTOLIN HFA) 108 (90 BASE)  MCG/ACT inhaler Inhale 2 puffs into the lungs every 4 (four) hours as needed for wheezing.  3 Inhaler  1  . albuterol (PROVENTIL) (2.5 MG/3ML) 0.083% nebulizer solution Take 3 mLs (2.5 mg total) by nebulization every 6 (six) hours as needed for wheezing.  75 mL  12  . Blood Glucose Monitoring Suppl (ACCU-CHEK AVIVA PLUS) W/DEVICE KIT 1 each by Does not apply route 3 (three) times daily. dx code 250.00 insulin requiring  1 kit  1  . [employer] LANCETS MICRO THIN 33G MISC Check blood sugar 3 times a day before meals dx code 250.00 insulin requiring  100 each  12  . glucose blood (ACCU-CHEK AVIVA PLUS) test strip Check blood sugar 3 times a day before meals dx code 250.00 insulin requiring  100 each  12  . Lancet Devices (ACCU-CHEK SOFTCLIX) lancets Check blood sugar 3 times a day before meals dx code 250.00 insulin requiring  100 each  12    Allergies: Allergies as of 04/03/2013  . (No Known Allergies)   Past Medical History  Diagnosis Date  . Diabetes mellitus 2007    HgA1C (02/20/2010) = 9.2, HgA1C (03/20/2009) = 12.1  . Hyperlipidemia   . Hypertension   . GERD (gastroesophageal reflux disease)   . CVA (cerebrovascular accident) 5997     right embolic stroke, no residual deficits  . Diverticulitis   . CVA (cerebral infarction) 7-yrs ago  . Arthritis   . Dysphagia   . CHF (congestive heart failure)   . DIABETES MELLITUS, TYPE II 12/04/2005  . HYPERLIPIDEMIA 12/04/2005  . HYPERTENSION 12/04/2005  . GERD 12/04/2005  . ARTHRITIS, KNEE 03/24/2006  . Lung mass 07/08/2010  . Meningioma 07/21/2010  . Hemangioma of liver 12/02/2010  . Depression 12/02/2010  . Adenocarcinoma of lung      Right upper lobe adenocarcinoma. s/p right lower lobectomy 12/15/10   Past Surgical History  Procedure Laterality Date  . Abdominal hysterectomy    . Video bronchoscope.  12/29/2007    [place]  . Wide excision of left upper back mass.    . Extracapsular cataract extraction with intraocular      lens  implantation.  . Right vats,right thoracotomy,right lower lobectomy with node dissection    . Incision and drainage perirectal abscess N/A 01/19/2013    Procedure: IRRIGATION AND DEBRIDEMENT PERIRECTAL ABSCESS;  Surgeon: Zenovia Jarred, MD;  Location: [facility];  Service: General;  Laterality: N/A;  . Craniotomy N/A 03/02/2013    Procedure: CRANIOTOMY TUMOR EXCISION;  Surgeon: Winfield Cunas, MD;  Location: [facility];  Service: Neurosurgery;  Laterality: N/A;  Bifrontal Craniotomy for tumor   Family History  Problem Relation Age of Onset  . Hyperlipidemia Brother   . Hypertension Brother   . Diabetes Brother    History   Social History  . Marital Status: Widowed    Spouse Name: N/A    Number of Children: N/A  . Years of Education: N/A   Occupational History  . Not on file.   Social History Main Topics  . Smoking status: Former Smoker    Types: Cigarettes    Quit date: 02/17/2000  . Smokeless tobacco: Never Used  . Alcohol Use: No  . Drug Use: No  . Sexual Activity: Not Currently   Other Topics Concern  . Not on file   Social History Narrative   Patient requests to use [employer] [facility] for her diabets testing supplies as of 09/05/2009.    Physical Exam: Blood pressure 111/88, pulse 96, temperature 98.6 F (37 C), temperature source Oral, SpO2 97.00%. General: resting in bed, no acute distress HEENT: PERRL, EOMI, no scleral icterus, coronal scar over cranium clean without erythema, fluctuance, tenderness Cardiac: RRR, no rubs, murmurs or gallops Pulm: clear to auscultation bilaterally, moving normal volumes of air (patient able to sit up with my help) Abd: soft, nontender, nondistended, BS normoactive Ext: warm and well perfused, no pedal edema Neuro: alert and oriented X person, place, current year, birthdate - does not know why she is at the hospital, aware of recent hospitalization for brain surgery but does not know why she had surgery, cranial nerves II-XII  grossly intact, strength 5/5 b/l UE & LE Skin: hyperpigmented rash on medial thighs   Lab results: Basic Metabolic Panel:  Recent Labs  04/03/13 0057  NA 143  K 3.5*  CL 104  CO2 26  GLUCOSE 115*  BUN 16  CREATININE 0.84  CALCIUM 8.4  MG 1.8  PHOS 2.5  AG: 14  Liver Function Tests:  Recent Labs  04/03/13 0057  AST 11  ALT 10  ALKPHOS 79  BILITOT <0.2*  PROT 6.3  ALBUMIN 2.5*   CBC:  Recent Labs  04/03/13 0056  WBC 11.3*  NEUTROABS 7.9*  HGB 8.2*  HCT 25.9*  MCV 81.2  PLT 546*  Baseline Hb ~10  Cardiac Enzymes:  Recent Labs  04/03/13 0057  TROPONINI <0.30   Urinalysis:  Recent Labs  04/03/13 0238  COLORURINE YELLOW  LABSPEC 1.026  PHURINE 5.0  GLUCOSEU NEGATIVE  HGBUR MODERATE*  BILIRUBINUR NEGATIVE  KETONESUR NEGATIVE  PROTEINUR 100*  UROBILINOGEN 0.2  NITRITE NEGATIVE  LEUKOCYTESUR LARGE*    04/03/2013 02:38  Urine-Other FEW YEAST  WBC, UA TOO NUMEROUS TO COUNT  RBC / HPF 11-20  Squamous Epithelial / LPF RARE  Bacteria, UA FEW (A)   Imaging results:  Dg Chest 2 View  04/03/2013   CLINICAL DATA:  ALTERED MENTAL STATUS, RIGHT LUNG CANCER  EXAM: CHEST  2 VIEW  COMPARISON:  DG CHEST 1V PORT dated 03/02/2013; CT CHEST W/CM dated 05/07/2012  FINDINGS: MILDLY ENLARGED CARDIAC SILHOUETTE. THERE IS ATELECTASIS IN THE RIGHT MID LUNG UNCHANGED FROM PRIOR. NO FOCAL CONSOLIDATION. NO PNEUMOTHORAX. NO PLEURAL FLUID.  IMPRESSION: 1. No clear acute findings. 2. Right lung atelectasis.   Electronically Signed   By: Suzy Bouchard M.D.   On: 04/03/2013 01:45   Ct Head Wo Contrast  04/03/2013   CLINICAL DATA:  Altered mental status  EXAM: CT HEAD WITHOUT CONTRAST  TECHNIQUE:  Contiguous axial images were obtained from the base of the skull through the vertex without intravenous contrast.  COMPARISON:  Prior MRI from 02/26/2013  FINDINGS: Postoperative changes from recent right frontal craniotomy with resection of large right frontal meningioma again  seen. A hypodense extra-axial fluid collection measuring 1 cm in diameter overlies the operative site subjacent to the craniotomy bone flap. A few scattered foci of pneumocephalus persists within this region. There is encephalomalacia within the subjacent right frontal lobe. Small linear hyperdensity within the lateral right frontal lobe is likely related to beam hardening artifact from overlying postsurgical changes at the anterior cranial vault. Previously seen right-to-left midline shift has resolved. Ventricles are stable without evidence of hydrocephalus.  Remote right frontal and right basal ganglia infarcts again noted. No new large vessel territory infarct. No acute intracranial hemorrhage.  No acute abnormality seen within the calvarium. Orbits are normal. Paranasal sinuses and mastoid air cells are clear.  IMPRESSION: 1. Postoperative changes from recent right frontal craniotomy with resection of previously identified right frontal meningioma. 2. Hypodense subdural fluid collection measuring 1 cm in maximal diameter subjacent to the craniotomy bone flap overlying the right frontal lobe. This collection is within normal limits for normal expected postoperative changes. 3. No acute intracranial hemorrhage or infarct identified. 4. Remote right frontal and right basal ganglia lacunar infarcts.   Electronically Signed   By: Jeannine Boga M.D.   On: 04/03/2013 02:48    Other results: EKG: sinus, regular, tachy, LAD, QTc 482, no ST/T wave changes, unchanged from prior tracings  Assessment & Plan by Problem: Ms. Leclaire is a 80 yo female with history of HTN, DM, CVA, CHF, adenocarcinoma of the lung, and recent meningioma s/p excision who presents on 04/03/13 with AMS.  #AMS: Responding appropriately, albeit slowly, on my exam. Broad differential, but given recent complaints at PCP visit and current UA/micro, most likely infectious d/t UTI (notably, cx from 03/28/13 visit grew >100,000 mult bacterial  morphotypes, suggest recollection).  Her last urine culture that isolated an organism was from 09/07/11 (e.coli at that time was sensitive to both cipro and ceftriazone).  Urine micro suggests few yeast - no obvious vaginal yeast on my exam, but she does have a moisture related rash/fungal rash on her medial thighs (to note, this rash has been biopsied at 01/2013 hospitalization - spongiotic dermatitis, drug rxn vs psoriasis).  I'm not sure if urine sample was catheterized.  To note, she has history of chronic bladder outlet obstruction, and urology referral placed on 01/30/13- booked until March 2015).  Other infectious etiologies include meningitis/encephalitis given recent craniotomy or bacteremia (though less likely given history, no focal deficits and no fever).  Medications may also be the culprit, as she has narcotics on board. She may be post-ictal given recent brain surgery, or keppra may be causing lethargy.  Head CT unrevealing. Labs not suggestive of hypoglycemia.  -Admit to medsurg (inpatient) -Follow urine cx, blood cultures not drawn, will draw if patient becomes febrile.   -Continue ceftriaxone 1g daily tomorrow -Hold narcotics for now -Continue keppra for now and monitor clinically -May consider fluconazole, but await urine culture -PT/OT eval & treat   #Hypokalemia: likely related to lasix.  Mg wnl. -Kdur 40 q4h x 2 -AM BMET  #Normocytic Anemia: No obvious s/s of bleeding, except mild tachycardia that could be explained by hydration status vs infection.  Baseline Hb ~10.  History of mild antral gastritis in 2012. 2004 Colonoscopy revealed diverticulosis.  -Repeat CBC now and  tomorrow morning -FOBT x 3 -Cont home PPI  #DM: Most recent A1c 9.7 on 03/28/13. Home regimen includes lantus 25u qHS and SSI aspart. -regular diet, as mental status seems to have improved, she is responding appropriately -SSI, sensitive -Reduced lantus to 5u qHS - escalate as indicated  #HTN: Normotensive  at admission. Home regimen includes amlodipine 10, coreg 25 bid, lasix 20 -continue coreg, restart amlodipine & lasix tomorrow  #Chronic sCHF: Myoview from 12/12/10 revealed EF 40% with diffuse hyopkinesis & abnl septal motion -Appears euvolemic on exam, restart home lasix tomorrow, continue BB at admission -Orthostatic vital signs to help guide volume assessment -Daily weights -To note, ACEI was d/c at 01/22/13 discharge d/t AKI, consider restarting at discharge.  #Seizure disorder: due to meningioma, now s/p excision by craniotomy -Continue Keppra 500 bid -Was scheduled for rad-onc today, will need to contact them  #H/o CVA: Nonfocal exam, continue ASA 81 & statin.  #VTE ppx: SCDs given recent brain surgery & Hb below baseline  #Code Status : full (per prior records, when I asked patient, she responded "I really don't know", unable to contact daughter)  Dispo: Disposition is deferred at this time, awaiting improvement of current medical problems. Anticipated discharge in approximately 2-3 day(s).   The patient does have a current PCP Jerene Pitch, MD) and does need an [facility] hospital follow-up appointment after discharge.  The patient does not have transportation limitations that hinder transportation to clinic appointments.  Signed: Othella Boyer, MD 04/03/2013, 7:04 AM

## 2013-04-03 NOTE — ED Notes (Signed)
Report attempted x 1

## 2013-04-03 NOTE — ED Notes (Addendum)
PER EMS: pt from home, hx of diabetes and UTIs. Family reports pt having altered mental status today, not being herself. Upon EMS arrival pt O2 92% and EMS started her on a NRB and sats 100%. BP-156/104, HR-108, CBG-126. Pt had brain surgery on Jan 15th.

## 2013-04-04 DIAGNOSIS — N390 Urinary tract infection, site not specified: Principal | ICD-10-CM

## 2013-04-04 LAB — GLUCOSE, CAPILLARY
GLUCOSE-CAPILLARY: 131 mg/dL — AB (ref 70–99)
GLUCOSE-CAPILLARY: 139 mg/dL — AB (ref 70–99)
GLUCOSE-CAPILLARY: 152 mg/dL — AB (ref 70–99)
Glucose-Capillary: 131 mg/dL — ABNORMAL HIGH (ref 70–99)

## 2013-04-04 LAB — CBC
HCT: 24.2 % — ABNORMAL LOW (ref 36.0–46.0)
HCT: 24.4 % — ABNORMAL LOW (ref 36.0–46.0)
HEMOGLOBIN: 7.6 g/dL — AB (ref 12.0–15.0)
Hemoglobin: 7.6 g/dL — ABNORMAL LOW (ref 12.0–15.0)
MCH: 25.8 pg — ABNORMAL LOW (ref 26.0–34.0)
MCH: 25.6 pg — AB (ref 26.0–34.0)
MCHC: 31.4 g/dL (ref 30.0–36.0)
MCHC: 31.1 g/dL (ref 30.0–36.0)
MCV: 82 fL (ref 78.0–100.0)
MCV: 82.2 fL (ref 78.0–100.0)
PLATELETS: 585 10*3/uL — AB (ref 150–400)
Platelets: 590 10*3/uL — ABNORMAL HIGH (ref 150–400)
RBC: 2.95 MIL/uL — ABNORMAL LOW (ref 3.87–5.11)
RBC: 2.97 MIL/uL — ABNORMAL LOW (ref 3.87–5.11)
RDW: 17.5 % — ABNORMAL HIGH (ref 11.5–15.5)
RDW: 17.4 % — AB (ref 11.5–15.5)
WBC: 10.4 10*3/uL (ref 4.0–10.5)
WBC: 11.9 10*3/uL — ABNORMAL HIGH (ref 4.0–10.5)

## 2013-04-04 LAB — BASIC METABOLIC PANEL
BUN: 18 mg/dL (ref 6–23)
CHLORIDE: 110 meq/L (ref 96–112)
CO2: 24 mEq/L (ref 19–32)
Calcium: 8.5 mg/dL (ref 8.4–10.5)
Creatinine, Ser: 0.89 mg/dL (ref 0.50–1.10)
GFR, EST AFRICAN AMERICAN: 71 mL/min — AB (ref >90)
GFR, EST NON AFRICAN AMERICAN: 61 mL/min — AB (ref >90)
Glucose, Bld: 131 mg/dL — ABNORMAL HIGH (ref 70–99)
Potassium: 4.4 mEq/L (ref 3.7–5.3)
SODIUM: 146 meq/L (ref 137–147)

## 2013-04-04 LAB — IRON AND TIBC
Iron: <10 ug/dL — ABNORMAL LOW (ref 42–135)
UIBC: <15 ug/dL — ABNORMAL LOW (ref 125–400)

## 2013-04-04 LAB — RETICULOCYTES
RBC.: 2.97 MIL/uL — ABNORMAL LOW (ref 3.87–5.11)
RETIC COUNT ABSOLUTE: 56.4 10*3/uL (ref 19.0–186.0)
Retic Ct Pct: 1.9 % (ref 0.4–3.1)

## 2013-04-04 LAB — SAVE SMEAR

## 2013-04-04 MED ORDER — ENSURE PUDDING PO PUDG
1.0000 | ORAL | Status: DC
  Administered 2013-04-04: 1 via ORAL

## 2013-04-04 MED ORDER — GLUCERNA SHAKE PO LIQD
237.0000 mL | Freq: Two times a day (BID) | ORAL | Status: DC
  Administered 2013-04-04 – 2013-04-05 (×3): 237 mL via ORAL

## 2013-04-04 NOTE — Progress Notes (Signed)
INITIAL NUTRITION ASSESSMENT  DOCUMENTATION CODES Per approved criteria  -Obesity Unspecified   INTERVENTION: Provide Glucerna Shakes BID Provide Ensure Pudding once daily Encourage PO intake  NUTRITION DIAGNOSIS: Predicted suboptimal energy intake related to AMS as evidenced by po intake 0-25% of meals.   Goal: Pt to meet >/= 90% of their estimated nutrition needs   Monitor:  PO intake, weight trends, labs  Reason for Assessment: Malnutrition Screening Tool, score of 5  80 y.o. female  Admitting Dx: Altered mental status  ASSESSMENT: 80 yo woman with history of HTN, DM, CVA, CHF, meningioma s/p craniotomy with tumor excision (03/02/13) presented on 04/03/13 with complaints of AMS. Per ED reports, daughter who was at bedside noted patient to be somnolent, confused, weak (not ambulating) & sluggish on the day prior to admission.  Pt very somnolent at time of visit. Pt reports eating well PTA but, states she doesn't feel like eating today. Per nursing notes, since admission pt refused one meal and ate 25% of one meal. RD familiar with pt from previous admission. Pt's usual body weight is 200 lbs. Appears that pt's weight has been stable at 190 lbs for the past month. Encouraged pt to tray to eat at each meal. Per chart, pt is followed by outpatient dietitian for diabetes management.   Height: Ht Readings from Last 1 Encounters:  04/03/13 5\' 4"  (1.626 m)    Weight: Wt Readings from Last 1 Encounters:  04/03/13 190 lb 4.8 oz (86.32 kg)    Ideal Body Weight: 120 lbs  % Ideal Body Weight: 158%  Wt Readings from Last 10 Encounters:  04/03/13 190 lb 4.8 oz (86.32 kg)  03/28/13 186 lb 3.2 oz (84.46 kg)  03/10/13 194 lb (87.998 kg)  03/10/13 194 lb (87.998 kg)  02/28/13 190 lb 7.6 oz (86.4 kg)  02/28/13 190 lb 7.6 oz (86.4 kg)  02/24/13 194 lb 1 oz (88.026 kg)  02/22/13 194 lb 1.6 oz (88.043 kg)  02/01/13 197 lb (89.359 kg)  01/27/13 205 lb 6.4 oz (93.169 kg)    Usual  Body Weight: 200 lbs  % Usual Body Weight: 95%  BMI:  Body mass index is 32.65 kg/(m^2).  Estimated Nutritional Needs: Kcal: 1700-1900 Protein: 90-100 grams Fluid: 1.7-1.9 L/day  Skin: WDL  Diet Order: Carb Control  EDUCATION NEEDS: -No education needs identified at this time   Intake/Output Summary (Last 24 hours) at 04/04/13 1142 Last data filed at 04/03/13 1839  Gross per 24 hour  Intake    120 ml  Output      0 ml  Net    120 ml    Last BM: 2/16  Labs:   Recent Labs Lab 04/03/13 0057 04/04/13 0535  NA 143 146  K 3.5* 4.4  CL 104 110  CO2 26 24  BUN 16 18  CREATININE 0.84 0.89  CALCIUM 8.4 8.5  MG 1.8  --   PHOS 2.5  --   GLUCOSE 115* 131*    CBG (last 3)   Recent Labs  04/03/13 1636 04/03/13 2115 04/04/13 0643  GLUCAP 161* 178* 131*    Scheduled Meds: . amLODipine  10 mg Oral Daily  . aspirin  81 mg Oral Daily  . carvedilol  25 mg Oral BID WC  . furosemide  20 mg Oral Daily  . insulin aspart  0-9 Units Subcutaneous TID WC  . insulin glargine  5 Units Subcutaneous QHS  . levETIRAcetam  500 mg Oral BID  . pantoprazole  20  mg Oral Daily  . simvastatin  20 mg Oral q1800    Continuous Infusions:   Past Medical History  Diagnosis Date  . Diabetes mellitus 2007    HgA1C (02/20/2010) = 9.2, HgA1C (03/20/2009) = 12.1  . Hyperlipidemia   . Hypertension   . GERD (gastroesophageal reflux disease)   . CVA (cerebrovascular accident) 1021     right embolic stroke, no residual deficits  . Diverticulitis   . CVA (cerebral infarction) 7-yrs ago  . Arthritis   . Dysphagia   . CHF (congestive heart failure)   . DIABETES MELLITUS, TYPE II 12/04/2005  . HYPERLIPIDEMIA 12/04/2005  . HYPERTENSION 12/04/2005  . GERD 12/04/2005  . ARTHRITIS, KNEE 03/24/2006  . Lung mass 07/08/2010  . Meningioma 07/21/2010  . Hemangioma of liver 12/02/2010  . Depression 12/02/2010  . Adenocarcinoma of lung      Right upper lobe adenocarcinoma. s/p right lower  lobectomy 12/15/10    Past Surgical History  Procedure Laterality Date  . Abdominal hysterectomy    . Video bronchoscope.  12/29/2007    [place]  . Wide excision of left upper back mass.    . Extracapsular cataract extraction with intraocular      lens implantation.  . Right vats,right thoracotomy,right lower lobectomy with node dissection    . Incision and drainage perirectal abscess N/A 01/19/2013    Procedure: IRRIGATION AND DEBRIDEMENT PERIRECTAL ABSCESS;  Surgeon: Zenovia Jarred, MD;  Location: [facility];  Service: General;  Laterality: N/A;  . Craniotomy N/A 03/02/2013    Procedure: CRANIOTOMY TUMOR EXCISION;  Surgeon: Winfield Cunas, MD;  Location: [facility];  Service: Neurosurgery;  Laterality: N/A;  Bifrontal Craniotomy for tumor    Pryor Ochoa RD, LDN Inpatient Clinical Dietitian Pager: [phone] After Hours Pager: [phone]

## 2013-04-04 NOTE — Evaluation (Signed)
Occupational Therapy Evaluation Patient Details Name: [name] MRN: [id number] DOB: 1934/02/03 Today's Date: 04/04/2013 Time: 9326-7124 OT Time Calculation (min): 22 min  OT Assessment / Plan / Recommendation History of present illness Ms. Damara is a 80 yo woman with history of HTN, DM, CVA, CHF, meningioma s/p craniotomy with tumor excision (03/02/13) presented on 04/03/13 with complaints of AMS.  Per ED reports, daughter who was at bedside noted patient to be somnolent, confused, weak (not ambulating) & sluggish on the day prior to admission.  She seems to complain of vague pain, but she has not been able to localize.   Clinical Impression   Pt demos decline in function with ADLs and ADL mobility safety and would benefit from acute OT services to address impairments to increase level of function and safety. Pt requires cues to initiate functional tasks, slow processing for one step commands    OT Assessment  Patient needs continued OT Services    Follow Up Recommendations  Home health OT;Supervision/Assistance - 24 hour    Barriers to Discharge   none  Equipment Recommendations  None recommended by OT    Recommendations for Other Services    Frequency  Min 2X/week    Precautions / Restrictions Precautions Precautions: Fall Restrictions Weight Bearing Restrictions: No   Pertinent Vitals/Pain No c/o pain    ADL  Grooming: Performed;Wash/dry face;Wash/dry hands;Minimal assistance Where Assessed - Grooming: Supported standing Upper Body Bathing: Simulated;Supervision/safety;Set up;Other (comment) (max cues to initiate) Where Assessed - Upper Body Bathing: Unsupported sitting Lower Body Bathing: Simulated;Moderate assistance;Other (comment) (max cues to initiate) Upper Body Dressing: Performed;Supervision/safety;Set up;Other (comment) (max cues to initiate) Where Assessed - Upper Body Dressing: Unsupported sitting Lower Body Dressing: Performed;Maximal assistance;Other  (comment) (max cues to initiate) Toilet Transfer: Performed;Minimal assistance Toilet Transfer Method: Sit to stand Toilet Transfer Equipment: Regular height toilet;Grab bars Toileting - Clothing Manipulation and Hygiene: Performed;Minimal assistance Where Assessed - Best boy and Hygiene: Standing Tub/Shower Transfer: Performed;Minimal assistance Tub/Shower Transfer Method: Therapist, art: Walk in shower;Grab bars;Other (comment) (3 in 1) Transfers/Ambulation Related to ADLs:  cues for hand placement and sequencing ADL Comments: max cues to initiate    OT Diagnosis: Generalized weakness;Cognitive deficits  OT Problem List: Decreased strength;Decreased knowledge of use of DME or AE;Decreased activity tolerance;Decreased safety awareness;Impaired balance (sitting and/or standing);Decreased cognition OT Treatment Interventions: Self-care/ADL training;Therapeutic exercise;Patient/family education;Neuromuscular education;Balance training;Therapeutic activities;DME and/or AE instruction   OT Goals(Current goals can be found in the care plan section) Acute Rehab OT Goals Patient Stated Goal: go back to bed OT Goal Formulation: With patient Time For Goal Achievement: 04/11/13 Potential to Achieve Goals: Good ADL Goals Pt Will Perform Grooming: with min guard assist;with supervision;with set-up;standing Pt Will Perform Upper Body Bathing: with set-up;sitting;standing Pt Will Perform Lower Body Bathing: with min assist;sit to/from stand Pt Will Perform Upper Body Dressing: with set-up;sitting;standing Pt Will Perform Lower Body Dressing: with mod assist;with min assist;sit to/from stand Pt Will Transfer to Toilet: with min guard assist;with supervision;grab bars;ambulating;regular height toilet Pt Will Perform Toileting - Clothing Manipulation and hygiene: with min guard assist;with supervision;sit to/from stand Pt Will Perform Tub/Shower Transfer:  with min guard assist;with supervision;ambulating;shower seat;grab bars  Visit Information  Last OT Received On: 04/04/13 Assistance Needed: +1 History of Present Illness: Ms. Yaniah is a 80 yo woman with history of HTN, DM, CVA, CHF, meningioma s/p craniotomy with tumor excision (03/02/13) presented on 04/03/13 with complaints of AMS.  Per ED reports, daughter who was at bedside noted  patient to be somnolent, confused, weak (not ambulating) & sluggish on the day prior to admission.  She seems to complain of vague pain, but she has not been able to localize.       Prior [place] expects to be discharged to:: Private residence Living Arrangements: Children Available Help at Discharge: Family;Available 24 hours/day Type of Home: House Home Access: Stairs to enter [employer] of Steps: 5 Entrance Stairs-Rails: Right Home Layout: One level Home Equipment: Walker - 2 wheels;Bedside commode;Cane - single point Additional Comments: Pt able to give some history of PLOF and home setup with incr time; also able to use most recent evaluation for information  Prior Function Level of Independence: Needs assistance Gait / Transfers Assistance Needed: ambulates with RW ADL's / Homemaking Assistance Needed: reports grandaughter gives her a bath; reports she can dress herself  Comments: pt reports she ambulated with RW or cane at home; reports she was independent with ADLs but has her daughter with her at all times  Communication Communication: No difficulties Dominant Hand: Left         Vision/Perception Vision - History Baseline Vision: Wears glasses only for reading Patient Visual Report: No change from baseline Perception Perception: Within Functional Limits   Cognition  Cognition Arousal/Alertness: Awake/alert Behavior During Therapy: Flat affect Overall Cognitive Status: Impaired/Different from baseline Area of Impairment:  Orientation;Problem solving;Following commands;Safety/judgement Orientation Level: Disoriented to;Situation Memory: Decreased short-term memory Following Commands: Follows one step commands with increased time Safety/Judgement: Decreased awareness of deficits Problem Solving: Slow processing;Decreased initiation;Requires verbal cues;Requires tactile cues General Comments: very delayed with answering questions and responding to one step commands; no family present to determine baseline     Extremity/Trunk Assessment Upper Extremity Assessment Upper Extremity Assessment: Overall WFL for tasks assessed;Generalized weakness Lower Extremity Assessment Lower Extremity Assessment: Defer to PT evaluation Cervical / Trunk Assessment Cervical / Trunk Assessment: Normal     Mobility Bed Mobility Overal bed mobility: Modified Independent General bed mobility comments: slow to process, requiree tactile cues to initiate Transfers Overall transfer level: Needs assistance Equipment used: Rolling walker (2 wheeled) Transfers: Sit to/from Stand Sit to Stand: Min assist General transfer comment:  cues for hand placement and sequencing          Balance Balance Overall balance assessment: Needs assistance;History of Falls Sitting-balance support: No upper extremity supported;Feet supported Sitting balance-Leahy Scale: Good Standing balance support: Single extremity supported;Bilateral upper extremity supported;During functional activity Standing balance-Leahy Scale: Poor   End of Session OT - End of Session Equipment Utilized During Treatment: Gait belt;Rolling walker;Other (comment) (3 in 1) Activity Tolerance: Patient tolerated treatment well Patient left: in bed;with call bell/phone within reach;with bed alarm set  GO     Britt Bottom 04/04/2013, 3:45 PM

## 2013-04-04 NOTE — Progress Notes (Signed)
   CARE MANAGEMENT NOTE 04/04/2013  Patient:  [name],[name]   Account Number:  [id number]  Date Initiated:  04/04/2013  Documentation initiated by:  Olga Coaster  Subjective/Objective Assessment:   ADMITTED WITH ALTERED MENTAL STATUS     Action/Plan:   CM FOLLOWING FOR DCP   Anticipated DC Date:  04/11/2013   Anticipated DC Plan:  [place]          Status of service:  In process, will continue to follow Medicare Important Message given?  NA - LOS <3 / Initial given by admissions (If response is "NO", the following Medicare IM given date fields will be blank)  Per UR Regulation:  Reviewed for med. necessity/level of care/duration of stay  Comments:  2/17/2015Mindi Slicker RN,BSN,MHA [phone]

## 2013-04-04 NOTE — H&P (Signed)
Patient seen and examined and agree with history and physical as outlined above.    Scharlene Gloss, MD

## 2013-04-04 NOTE — Progress Notes (Signed)
Subjective:   Pt has no new complaints this AM.  Pt is feeling better and did not have any events overnight.  She seems confused at times when asked if she is having dysuria she initially responds yes and then no.  She is alert & oriented X 3.  I spoke with her daughter over the phone and she reports that she has been having episodes similar to this on and off.  She cares for her mother at home and is unwilling to entertain the idea of SNF for now which I feel would be more appropriate.  She states the pt has not been having any dark stools or signs of bleeding any where but has noticed that she is constipated at times and recently started her on colace.    Objective:   Vital signs in last 24 hours: Filed Vitals:   04/03/13 1700 04/04/13 0144 04/04/13 0553 04/04/13 0859  BP: 98/65 125/56 128/61 103/52  Pulse: 66 91 88 91  Temp: 98.1 F (36.7 C) 98.9 F (37.2 C) 99.3 F (37.4 C) 98 F (36.7 C)  TempSrc: Oral Oral Oral Axillary  Resp: 20 18 24 24   Height: 5\' 4"  (1.626 m)     Weight: 190 lb 4.8 oz (86.32 kg)     SpO2: 92% 94% 96% 97%   Weight change:   Intake/Output Summary (Last 24 hours) at 04/04/13 1151 Last data filed at 04/03/13 1839  Gross per 24 hour  Intake    120 ml  Output      0 ml  Net    120 ml    Physical Exam: Constitutional: Vital signs reviewed.  Patient is in no acute distress and cooperative with exam.   Head: Normocephalic and atraumatic Eyes: EOMI, conjunctivae normal, no scleral icterus  Neck: Supple, Trachea midline Cardiovascular: RRR, S1, S2 present, no MRG, DP 2+ b/l Pulmonary/Chest: normal respiratory effort, CTAB, no wheezes, rales, or rhonchi Abdominal: Soft. +BS, Non-tender, non-distended, no suprapubic tenderness Neurological: A&O x3, cranial nerve II-XII are grossly intact, moving all extremities  Skin: Warm, dry and intact.  Psychiatric: Normal mood and affect.   Lab Results:  BMP:  Recent Labs Lab 04/03/13 0057 04/04/13 0535    NA 143 146  K 3.5* 4.4  CL 104 110  CO2 26 24  GLUCOSE 115* 131*  BUN 16 18  CREATININE 0.84 0.89  CALCIUM 8.4 8.5  MG 1.8  --   PHOS 2.5  --     CBC:  Recent Labs Lab 04/03/13 0056 04/03/13 0920 04/04/13 0535  WBC 11.3* 11.9* 10.4  NEUTROABS 7.9*  --   --   HGB 8.2* 8.3* 7.6*  HCT 25.9* 26.6* 24.2*  MCV 81.2 81.6 82.0  PLT 546* 548* 590*    Coagulation: No results found for this basename: LABPROT, INR,  in the last 168 hours  CBG:            Recent Labs Lab 03/28/13 1619 04/03/13 1148 04/03/13 1636 04/03/13 2115 04/04/13 0643  GLUCAP 222* 196* 161* 178* 131*           HA1C:       Recent Labs Lab 03/28/13 1635  HGBA1C 9.7    Lipid Panel: No results found for this basename: CHOL, HDL, LDLCALC, TRIG, CHOLHDL, LDLDIRECT,  in the last 168 hours  LFTs:  Recent Labs Lab 04/03/13 0057  AST 11  ALT 10  ALKPHOS 79  BILITOT <0.2*  PROT 6.3  ALBUMIN 2.5*  Pancreatic Enzymes: No results found for this basename: LIPASE, AMYLASE,  in the last 168 hours  Ammonia: No results found for this basename: AMMONIA,  in the last 168 hours  Cardiac Enzymes:  Recent Labs Lab 04/03/13 0057  TROPONINI <0.30   Lab Results  Component Value Date   CKTOTAL 34 02/25/2013   CKMB 2.6 11/10/2010   TROPONINI <0.30 04/03/2013    EKG:  Date/Time:    Ventricular Rate:    PR Interval:    QRS Duration:   QT Interval:    QTC Calculation:   R Axis:     Text Interpretation:    BNP: No results found for this basename: PROBNP,  in the last 168 hours  D-Dimer: No results found for this basename: DDIMER,  in the last 168 hours  Urinalysis:  Recent Labs Lab 03/28/13 1715 04/03/13 0238  COLORURINE YELLOW YELLOW  LABSPEC 1.016 1.026  PHURINE 5.0 5.0  GLUCOSEU NEG NEGATIVE  HGBUR LARGE* MODERATE*  BILIRUBINUR NEG NEGATIVE  KETONESUR NEG NEGATIVE  PROTEINUR 30* 100*  UROBILINOGEN 0.2 0.2  NITRITE NEG NEGATIVE  LEUKOCYTESUR MOD* LARGE*    Micro  Results: Recent Results (from the past 240 hour(s))  URINE CULTURE     Status: None   Collection Time    03/28/13  5:15 PM      Result Value Ref Range Status   Colony Count >=100,000 COLONIES/ML   Final   Organism ID, Bacteria Multiple bacterial morphotypes present, none   Final   Organism ID, Bacteria predominant. Suggest appropriate recollection if    Final   Organism ID, Bacteria clinically indicated.   Final  URINE CULTURE     Status: None   Collection Time    04/03/13  2:38 AM      Result Value Ref Range Status   Specimen Description URINE, RANDOM   Final   Special Requests NONE   Final   Culture  Setup Time     Final   Value: 04/03/2013 03:07     Performed at [facility]     Final   Value: NO GROWTH     Performed at [facility]   Culture     Final   Value: NO GROWTH     Performed at [facility]   Report Status 04/03/2013 FINAL   Final    Blood Culture:    Component Value Date/Time   SDES URINE, RANDOM 04/03/2013 0238   SPECREQUEST NONE 04/03/2013 0238   CULT  Value: NO GROWTH Performed at [facility] 04/03/2013 0238   REPTSTATUS 04/03/2013 FINAL 04/03/2013 0238    Studies/Results: Dg Chest 2 View  04/03/2013   CLINICAL DATA:  ALTERED MENTAL STATUS, RIGHT LUNG CANCER  EXAM: CHEST  2 VIEW  COMPARISON:  DG CHEST 1V PORT dated 03/02/2013; CT CHEST W/CM dated 05/07/2012  FINDINGS: MILDLY ENLARGED CARDIAC SILHOUETTE. THERE IS ATELECTASIS IN THE RIGHT MID LUNG UNCHANGED FROM PRIOR. NO FOCAL CONSOLIDATION. NO PNEUMOTHORAX. NO PLEURAL FLUID.  IMPRESSION: 1. No clear acute findings. 2. Right lung atelectasis.   Electronically Signed   By: Suzy Bouchard M.D.   On: 04/03/2013 01:45   Ct Head Wo Contrast  04/03/2013   CLINICAL DATA:  Altered mental status  EXAM: CT HEAD WITHOUT CONTRAST  TECHNIQUE: Contiguous axial images were obtained from the base of the skull through the vertex without intravenous contrast.  COMPARISON:  Prior MRI  from 02/26/2013  FINDINGS: Postoperative changes from recent  right frontal craniotomy with resection of large right frontal meningioma again seen. A hypodense extra-axial fluid collection measuring 1 cm in diameter overlies the operative site subjacent to the craniotomy bone flap. A few scattered foci of pneumocephalus persists within this region. There is encephalomalacia within the subjacent right frontal lobe. Small linear hyperdensity within the lateral right frontal lobe is likely related to beam hardening artifact from overlying postsurgical changes at the anterior cranial vault. Previously seen right-to-left midline shift has resolved. Ventricles are stable without evidence of hydrocephalus.  Remote right frontal and right basal ganglia infarcts again noted. No new large vessel territory infarct. No acute intracranial hemorrhage.  No acute abnormality seen within the calvarium. Orbits are normal. Paranasal sinuses and mastoid air cells are clear.  IMPRESSION: 1. Postoperative changes from recent right frontal craniotomy with resection of previously identified right frontal meningioma. 2. Hypodense subdural fluid collection measuring 1 cm in maximal diameter subjacent to the craniotomy bone flap overlying the right frontal lobe. This collection is within normal limits for normal expected postoperative changes. 3. No acute intracranial hemorrhage or infarct identified. 4. Remote right frontal and right basal ganglia lacunar infarcts.   Electronically Signed   By: Jeannine Boga M.D.   On: 04/03/2013 02:48    Medications:  Scheduled Meds: . aspirin  81 mg Oral Daily  . carvedilol  25 mg Oral BID WC  . insulin aspart  0-9 Units Subcutaneous TID WC  . insulin glargine  5 Units Subcutaneous QHS  . levETIRAcetam  500 mg Oral BID  . pantoprazole  20 mg Oral Daily  . simvastatin  20 mg Oral q1800   Continuous Infusions:  PRN Meds:.albuterol  Antibiotics: Anti-infectives   Start     Dose/Rate  Route Frequency Ordered Stop   04/04/13 0600  cefTRIAXone (ROCEPHIN) 1 g in dextrose 5 % 50 mL IVPB  Status:  Discontinued     1 g 100 mL/hr over 30 Minutes Intravenous Every 24 hours 04/03/13 0836 04/04/13 0740   04/03/13 1000  fluconazole (DIFLUCAN) tablet 150 mg  Status:  Discontinued     150 mg Oral  Once 04/03/13 0831 04/03/13 0852   04/03/13 0345  cefTRIAXone (ROCEPHIN) 1 g in dextrose 5 % 50 mL IVPB     1 g 100 mL/hr over 30 Minutes Intravenous  Once 04/03/13 0333 04/03/13 1106     Antibiotics Given (last 72 hours)   Date/Time Action Medication Dose Rate   04/04/13 0559 Given   cefTRIAXone (ROCEPHIN) 1 g in dextrose 5 % 50 mL IVPB 1 g 100 mL/hr      Day of Hospitalization:  1 Consults:    Assessment/Plan:   Principal Problem:   Altered mental status Active Problems:   DIABETES MELLITUS, TYPE II   HYPERTENSION   Chronic systolic congestive heart failure   Risk for falls   UTI (lower urinary tract infection)   Hypokalemia  #Acute encephalopathy - Most likely multifactorial including medication induced; pt has been taking opiods and likely this is contributing.  I suspect she also has some underlying vascular dementia given her h/o CVA's and the stepwise progression.  It appears this has been going on for some time according to her daughter where she has spells that she stares off into space.  I spoke with her daughter at length regarding this and told her that she may gradually have increased episodes of this.  Head CT unrevealing. Labs not suggestive of hypoglycemia. UA not suggestive of infection but will  wait on cultures.  PT is recommending [facility].  However, spoke with daughter about SNF and she is unwilling to follow this route currently.  -d/c ceftriaxone 1g daily as unlikely infection -Hold narcotics indefinitely -continue keppra for now and monitor clinically  -continue PT/OT  #Hypokalemia: Resolved; likely related to lasix. Mg wnl. She was given Kdur 40 q4h x  2. Potassium 2/17 is 4.4. -monitor   #Microcytic Anemia: No obvious s/s of bleeding, Baseline Hb ~10. Pt has thrombocytosis.  History of mild antral gastritis in 2012. 2004 Colonoscopy revealed diverticulosis. -FOBT x 3 pending  -Cont home PPI  -recheck CBC  #Uncontrolled DMII: Most recent A1c 9.7 on 03/28/13. Home regimen includes lantus 25u qHS and SSI aspart.  -carb modified diet -SSI, sensitive  -continue reduced lantus of 5u qHS - escalate as indicated   CBG (last 3)   Recent Labs  04/03/13 1636 04/03/13 2115 04/04/13 0643  GLUCAP 161* 178* 131*   #HTN: Normotensive at admission. Home regimen includes amlodipine 10, coreg 25 bid, lasix 20.  However, BP has been low today.  -continue coreg -hold lasix, amlodipine given soft BP today  #Chronic systolic HF: Myoview from 12/12/10 revealed EF 40% with diffuse hyopkinesis & abnl septal motion  -Appears euvolemic on exam, continue BB at admission  -Orthostatic vital signs to help guide volume assessment  -Daily weights  -To note, ACEI was d/c at 01/22/13 discharge d/t AKI, consider restarting at discharge.   #Seizure disorder: due to meningioma, now s/p excision by craniotomy  -Continue Keppra 500 bid   #H/o CVA: Nonfocal exam, continue ASA 81 & statin. Pt may have underlying vascular dementia that is contributing to her periods of forgetfulness.   #VTE ppx: SCDs given recent brain surgery & Hb below baseline   #Code Status : full (per prior records, when I asked patient, she responded "I really don't know", unable to contact daughter)  #Dispo- Disposition is deferred at this time, awaiting improvement of current medical problems.  Anticipated discharge in approximately 1-2 day(s).    LOS: 1 day   Michail Jewels, MD PGY-1, Internal Medicine  [phone] (7AM-5PM Mon-Fri) 04/04/2013, 11:51 AM

## 2013-04-04 NOTE — Progress Notes (Signed)
Unable to collect the hemoccult test, MD made aware.

## 2013-04-04 NOTE — Clinical Documentation Improvement (Signed)
THIS DOCUMENT IS NOT A PERMANENT PART OF THE MEDICAL RECORD  Please update your documentation with the medical record to reflect your response to this query. If you need help knowing how to do this please call [phone].  04/04/13   Dear Dr. Burnard Bunting Rolley Sims,  In a better effort to capture your patient's severity of illness, reflect appropriate length of stay and utilization of resources, a review of the patient medical record has revealed the following indicators.    Based on your clinical judgment, please clarify and document in a progress note and/or discharge summary the clinical condition associated with the following supporting information:  In responding to this query please exercise your independent judgment.  The fact that a query is asked, does not imply that any particular answer is desired or expected. You may use possible, probable, or suspect with inpatient documentation. Possible, probable, suspected diagnoses MUST be documented at the time of discharge.  Possible Problems:  -Cerebral Edema -Other Condition -Cannot Clinically Determine  Supporting Information: Risk Factors:(As per notes) "history of HTN, DM, CVA, CHF, meningioma s/p craniotomy with tumor excision (03/02/13) presented on 04/03/13 with complaints of AMS"  Signs & Symptoms:(As per notes) " AMS"  Diagnostics: Radiology CT Head scan: 04-03-13 Hypodense subdural fluid collection measuring 1 cm in maximal diameter subjacent to the craniotomy bone flap overlying the right frontal lobe  Treatment: (LASIX) 20 MG tablet  Take 1 tablet (20 mg total) by mouth daily.  (KEPPRA) 500 MG tablet  Take 1 tablet (500 mg total) by mouth 2 (two) times daily  Reviewed: additional documentation in the medical record - not cerebral edema, CT findings are post operative changes; a more appropriate description may be acute encephalopathy rather than "AMS" and this is reflected in the daily progress note.   Thank  You,  Alessandra Grout RN, BSN, CCDS, Clinical Documentation Specialist: [phone] Cell=[phone] [place]

## 2013-04-04 NOTE — Progress Notes (Signed)
Patient seen and examined and agree with plan per resident team.   Scharlene Gloss, MD

## 2013-04-05 ENCOUNTER — Emergency Department (HOSPITAL_COMMUNITY): Payer: Medicare Other

## 2013-04-05 ENCOUNTER — Other Ambulatory Visit: Payer: Self-pay | Admitting: *Deleted

## 2013-04-05 ENCOUNTER — Inpatient Hospital Stay (HOSPITAL_COMMUNITY)
Admission: EM | Admit: 2013-04-05 | Discharge: 2013-04-10 | DRG: 175 | Disposition: A | Discharge status: Home / Self Care | Payer: Medicare Other | Attending: Internal Medicine | Admitting: Internal Medicine

## 2013-04-05 ENCOUNTER — Telehealth: Payer: Self-pay | Admitting: Internal Medicine

## 2013-04-05 ENCOUNTER — Encounter (HOSPITAL_COMMUNITY): Payer: Self-pay | Admitting: Emergency Medicine

## 2013-04-05 DIAGNOSIS — Z87891 Personal history of nicotine dependence: Secondary | ICD-10-CM | POA: Diagnosis not present

## 2013-04-05 DIAGNOSIS — K219 Gastro-esophageal reflux disease without esophagitis: Secondary | ICD-10-CM | POA: Diagnosis present

## 2013-04-05 DIAGNOSIS — R404 Transient alteration of awareness: Secondary | ICD-10-CM | POA: Diagnosis not present

## 2013-04-05 DIAGNOSIS — J189 Pneumonia, unspecified organism: Secondary | ICD-10-CM

## 2013-04-05 DIAGNOSIS — I2699 Other pulmonary embolism without acute cor pulmonale: Principal | ICD-10-CM | POA: Diagnosis present

## 2013-04-05 DIAGNOSIS — IMO0001 Reserved for inherently not codable concepts without codable children: Secondary | ICD-10-CM | POA: Diagnosis not present

## 2013-04-05 DIAGNOSIS — Z794 Long term (current) use of insulin: Secondary | ICD-10-CM

## 2013-04-05 DIAGNOSIS — G40909 Epilepsy, unspecified, not intractable, without status epilepticus: Secondary | ICD-10-CM | POA: Diagnosis present

## 2013-04-05 DIAGNOSIS — E785 Hyperlipidemia, unspecified: Secondary | ICD-10-CM | POA: Diagnosis present

## 2013-04-05 DIAGNOSIS — Z86011 Personal history of benign neoplasm of the brain: Secondary | ICD-10-CM | POA: Diagnosis present

## 2013-04-05 DIAGNOSIS — D473 Essential (hemorrhagic) thrombocythemia: Secondary | ICD-10-CM | POA: Diagnosis present

## 2013-04-05 DIAGNOSIS — N320 Bladder-neck obstruction: Secondary | ICD-10-CM | POA: Diagnosis present

## 2013-04-05 DIAGNOSIS — Z7982 Long term (current) use of aspirin: Secondary | ICD-10-CM | POA: Diagnosis not present

## 2013-04-05 DIAGNOSIS — Z8673 Personal history of transient ischemic attack (TIA), and cerebral infarction without residual deficits: Secondary | ICD-10-CM | POA: Diagnosis not present

## 2013-04-05 DIAGNOSIS — I5020 Unspecified systolic (congestive) heart failure: Secondary | ICD-10-CM | POA: Diagnosis present

## 2013-04-05 DIAGNOSIS — E1140 Type 2 diabetes mellitus with diabetic neuropathy, unspecified: Secondary | ICD-10-CM | POA: Diagnosis present

## 2013-04-05 DIAGNOSIS — E1165 Type 2 diabetes mellitus with hyperglycemia: Secondary | ICD-10-CM

## 2013-04-05 DIAGNOSIS — Z85118 Personal history of other malignant neoplasm of bronchus and lung: Secondary | ICD-10-CM | POA: Diagnosis not present

## 2013-04-05 DIAGNOSIS — D509 Iron deficiency anemia, unspecified: Secondary | ICD-10-CM | POA: Diagnosis not present

## 2013-04-05 DIAGNOSIS — Z87440 Personal history of urinary (tract) infections: Secondary | ICD-10-CM

## 2013-04-05 DIAGNOSIS — J9600 Acute respiratory failure, unspecified whether with hypoxia or hypercapnia: Secondary | ICD-10-CM | POA: Diagnosis present

## 2013-04-05 DIAGNOSIS — I10 Essential (primary) hypertension: Secondary | ICD-10-CM | POA: Diagnosis not present

## 2013-04-05 DIAGNOSIS — E1159 Type 2 diabetes mellitus with other circulatory complications: Secondary | ICD-10-CM | POA: Diagnosis present

## 2013-04-05 DIAGNOSIS — Z79899 Other long term (current) drug therapy: Secondary | ICD-10-CM

## 2013-04-05 DIAGNOSIS — D320 Benign neoplasm of cerebral meninges: Secondary | ICD-10-CM | POA: Diagnosis present

## 2013-04-05 DIAGNOSIS — R5381 Other malaise: Secondary | ICD-10-CM | POA: Diagnosis not present

## 2013-04-05 DIAGNOSIS — G9340 Encephalopathy, unspecified: Secondary | ICD-10-CM | POA: Diagnosis present

## 2013-04-05 DIAGNOSIS — B3749 Other urogenital candidiasis: Secondary | ICD-10-CM | POA: Diagnosis present

## 2013-04-05 DIAGNOSIS — R4182 Altered mental status, unspecified: Secondary | ICD-10-CM | POA: Diagnosis not present

## 2013-04-05 DIAGNOSIS — R5383 Other fatigue: Secondary | ICD-10-CM | POA: Diagnosis not present

## 2013-04-05 DIAGNOSIS — I509 Heart failure, unspecified: Secondary | ICD-10-CM | POA: Diagnosis present

## 2013-04-05 DIAGNOSIS — R509 Fever, unspecified: Secondary | ICD-10-CM | POA: Diagnosis not present

## 2013-04-05 DIAGNOSIS — R0602 Shortness of breath: Secondary | ICD-10-CM | POA: Diagnosis not present

## 2013-04-05 DIAGNOSIS — Z9181 History of falling: Secondary | ICD-10-CM

## 2013-04-05 DIAGNOSIS — E872 Acidosis, unspecified: Secondary | ICD-10-CM | POA: Diagnosis present

## 2013-04-05 DIAGNOSIS — Z86711 Personal history of pulmonary embolism: Secondary | ICD-10-CM | POA: Diagnosis present

## 2013-04-05 DIAGNOSIS — I5032 Chronic diastolic (congestive) heart failure: Secondary | ICD-10-CM | POA: Diagnosis present

## 2013-04-05 DIAGNOSIS — I5022 Chronic systolic (congestive) heart failure: Secondary | ICD-10-CM | POA: Diagnosis present

## 2013-04-05 DIAGNOSIS — E119 Type 2 diabetes mellitus without complications: Secondary | ICD-10-CM | POA: Diagnosis present

## 2013-04-05 DIAGNOSIS — R0989 Other specified symptoms and signs involving the circulatory and respiratory systems: Secondary | ICD-10-CM | POA: Diagnosis not present

## 2013-04-05 LAB — CBC WITH DIFFERENTIAL/PLATELET
BASOS ABS: 0 10*3/uL (ref 0.0–0.1)
BASOS ABS: 0 10*3/uL (ref 0.0–0.1)
Basophils Relative: 0 % (ref 0–1)
Basophils Relative: 0 % (ref 0–1)
EOS PCT: 0 % (ref 0–5)
EOS PCT: 0 % (ref 0–5)
Eosinophils Absolute: 0 10*3/uL (ref 0.0–0.7)
Eosinophils Absolute: 0 10*3/uL (ref 0.0–0.7)
HCT: 23.8 % — ABNORMAL LOW (ref 36.0–46.0)
HCT: 27.7 % — ABNORMAL LOW (ref 36.0–46.0)
Hemoglobin: 7.5 g/dL — ABNORMAL LOW (ref 12.0–15.0)
Hemoglobin: 8.7 g/dL — ABNORMAL LOW (ref 12.0–15.0)
LYMPHS PCT: 22 % (ref 12–46)
Lymphocytes Relative: 12 % (ref 12–46)
Lymphs Abs: 2.3 10*3/uL (ref 0.7–4.0)
Lymphs Abs: 1.5 10*3/uL (ref 0.7–4.0)
MCH: 25.6 pg — ABNORMAL LOW (ref 26.0–34.0)
MCH: 25.3 pg — AB (ref 26.0–34.0)
MCHC: 31.5 g/dL (ref 30.0–36.0)
MCHC: 31.4 g/dL (ref 30.0–36.0)
MCV: 81.2 fL (ref 78.0–100.0)
MCV: 80.5 fL (ref 78.0–100.0)
MONO ABS: 1.2 10*3/uL — AB (ref 0.1–1.0)
Monocytes Absolute: 1.1 10*3/uL — ABNORMAL HIGH (ref 0.1–1.0)
Monocytes Relative: 10 % (ref 3–12)
Monocytes Relative: 10 % (ref 3–12)
NEUTROS ABS: 7.3 10*3/uL (ref 1.7–7.7)
Neutro Abs: 9.5 10*3/uL — ABNORMAL HIGH (ref 1.7–7.7)
Neutrophils Relative %: 68 % (ref 43–77)
Neutrophils Relative %: 78 % — ABNORMAL HIGH (ref 43–77)
Platelets: 568 10*3/uL — ABNORMAL HIGH (ref 150–400)
Platelets: 731 10*3/uL — ABNORMAL HIGH (ref 150–400)
RBC: 2.93 MIL/uL — ABNORMAL LOW (ref 3.87–5.11)
RBC: 3.44 MIL/uL — ABNORMAL LOW (ref 3.87–5.11)
RDW: 17.4 % — AB (ref 11.5–15.5)
RDW: 17.2 % — AB (ref 11.5–15.5)
WBC: 10.8 10*3/uL — AB (ref 4.0–10.5)
WBC: 12.2 10*3/uL — ABNORMAL HIGH (ref 4.0–10.5)

## 2013-04-05 LAB — GLUCOSE, CAPILLARY
Glucose-Capillary: 140 mg/dL — ABNORMAL HIGH (ref 70–99)
Glucose-Capillary: 128 mg/dL — ABNORMAL HIGH (ref 70–99)

## 2013-04-05 LAB — COMPREHENSIVE METABOLIC PANEL
ALT: 11 U/L (ref 0–35)
AST: 18 U/L (ref 0–37)
Albumin: 2.7 g/dL — ABNORMAL LOW (ref 3.5–5.2)
Alkaline Phosphatase: 106 U/L (ref 39–117)
BUN: 18 mg/dL (ref 6–23)
CALCIUM: 8.6 mg/dL (ref 8.4–10.5)
CO2: 24 meq/L (ref 19–32)
CREATININE: 0.92 mg/dL (ref 0.50–1.10)
Chloride: 103 mEq/L (ref 96–112)
GFR calc Af Amer: 68 mL/min — ABNORMAL LOW (ref >90)
GFR, EST NON AFRICAN AMERICAN: 59 mL/min — AB (ref >90)
Glucose, Bld: 202 mg/dL — ABNORMAL HIGH (ref 70–99)
Potassium: 4.2 mEq/L (ref 3.7–5.3)
Sodium: 142 mEq/L (ref 137–147)
Total Bilirubin: 0.3 mg/dL (ref 0.3–1.2)
Total Protein: 7 g/dL (ref 6.0–8.3)

## 2013-04-05 LAB — POCT I-STAT 3, ART BLOOD GAS (G3+)
Bicarbonate: 23.6 mEq/L (ref 20.0–24.0)
O2 Saturation: 75 %
PCO2 ART: 34.6 mmHg — AB (ref 35.0–45.0)
PH ART: 7.449 (ref 7.350–7.450)
PO2 ART: 41 mmHg — AB (ref 80.0–100.0)
Patient temperature: 101.6
TCO2: 25 mmol/L (ref 0–100)

## 2013-04-05 LAB — VITAMIN B12: Vitamin B-12: 466 pg/mL (ref 211–911)

## 2013-04-05 LAB — PRO B NATRIURETIC PEPTIDE: Pro B Natriuretic peptide (BNP): 3561 pg/mL — ABNORMAL HIGH (ref 0–450)

## 2013-04-05 LAB — FERRITIN: FERRITIN: 208 ng/mL (ref 10–291)

## 2013-04-05 LAB — FOLATE: Folate: 15.2 ng/mL

## 2013-04-05 LAB — CG4 I-STAT (LACTIC ACID): Lactic Acid, Venous: 1.1 mmol/L (ref 0.5–2.2)

## 2013-04-05 MED ORDER — DOCUSATE SODIUM 100 MG PO CAPS
100.0000 mg | ORAL_CAPSULE | Freq: Two times a day (BID) | ORAL | Status: DC
  Administered 2013-04-05: 100 mg via ORAL
  Filled 2013-04-05: qty 1

## 2013-04-05 MED ORDER — POLYSACCHARIDE IRON COMPLEX 150 MG PO CAPS
150.0000 mg | ORAL_CAPSULE | Freq: Every day | ORAL | Status: DC
  Administered 2013-04-05: 150 mg via ORAL
  Filled 2013-04-05: qty 1

## 2013-04-05 MED ORDER — ACETAMINOPHEN 325 MG PO TABS
650.0000 mg | ORAL_TABLET | Freq: Four times a day (QID) | ORAL | Status: DC | PRN
  Administered 2013-04-05: 650 mg via ORAL
  Filled 2013-04-05: qty 2

## 2013-04-05 MED ORDER — ACETAMINOPHEN 650 MG RE SUPP: 650.0000 mg | RECTAL | Status: DC | PRN

## 2013-04-05 MED ORDER — PIPERACILLIN-TAZOBACTAM 3.375 G IVPB
3.3750 g | Freq: Three times a day (TID) | INTRAVENOUS | Status: DC
  Filled 2013-04-05 (×2): qty 50

## 2013-04-05 MED ORDER — VANCOMYCIN HCL IN DEXTROSE 750-5 MG/150ML-% IV SOLN
750.0000 mg | Freq: Two times a day (BID) | INTRAVENOUS | Status: DC
  Filled 2013-04-05: qty 150

## 2013-04-05 MED ORDER — DSS 100 MG PO CAPS: 100.0000 mg | ORAL_CAPSULE | Freq: Two times a day (BID) | ORAL | Status: DC

## 2013-04-05 MED ORDER — SODIUM CHLORIDE 0.9 % IV SOLN
125.0000 mg | Freq: Once | INTRAVENOUS | Status: AC
  Administered 2013-04-05: 125 mg via INTRAVENOUS
  Filled 2013-04-05 (×2): qty 10

## 2013-04-05 MED ORDER — POLYSACCHARIDE IRON COMPLEX 150 MG PO CAPS: 150.0000 mg | ORAL_CAPSULE | Freq: Every day | ORAL | Status: DC

## 2013-04-05 MED ORDER — VANCOMYCIN HCL IN DEXTROSE 1-5 GM/200ML-% IV SOLN
1000.0000 mg | Freq: Once | INTRAVENOUS | Status: AC
  Administered 2013-04-05: 1000 mg via INTRAVENOUS
  Filled 2013-04-05: qty 200

## 2013-04-05 MED ORDER — PIPERACILLIN-TAZOBACTAM 3.375 G IVPB 30 MIN
3.3750 g | Freq: Once | INTRAVENOUS | Status: AC
  Administered 2013-04-05: 3.375 g via INTRAVENOUS
  Filled 2013-04-05: qty 50

## 2013-04-05 NOTE — ED Notes (Signed)
Two nurses attempted for second line with no success. IV team paged.

## 2013-04-05 NOTE — Progress Notes (Addendum)
Physical Therapy Treatment Patient Details Name: Evalin Shawhan MRN: [id number] DOB: 01/11/1934 Today's Date: 04/05/2013 Time: 2130-8657 PT Time Calculation (min): 26 min  PT Assessment / Plan / Recommendation  History of Present Illness Ms. Ashawna is a 80 yo woman with history of HTN, DM, CVA, CHF, meningioma s/p craniotomy with tumor excision (03/02/13) presented on 04/03/13 with complaints of AMS.  Per ED reports, daughter who was at bedside noted patient to be somnolent, confused, weak (not ambulating) & sluggish on the day prior to admission.  She seems to complain of vague pain, but she has not been able to localize.   PT Comments   Pt continues to be delayed with motor planning and has cognitive deficits. Pt required mod (A) to maintain balance and achieve standing position from bed. Pt had LOB ambulating to chair; required (A) to maintain balance. PT recommending SNF for post acute rehab; however, according to MD note, daughter is refusing. Pt is a high fall risk and requires 24/7 (A) with mobility.   Follow Up Recommendations  SNF;Supervision/Assistance - 24 hour     Does the patient have the potential to tolerate intense rehabilitation     Barriers to Discharge        Equipment Recommendations  None recommended by PT    Recommendations for Other Services OT consult  Frequency Min 2X/week   Progress towards PT Goals Progress towards PT goals: Progressing toward goals  Plan Discharge plan needs to be updated    Precautions / Restrictions Precautions Precautions: Fall Restrictions Weight Bearing Restrictions: No   Pertinent Vitals/Pain No complaints.     Mobility  Bed Mobility Overal bed mobility: Needs Assistance Bed Mobility: Supine to Sit Supine to sit: Min assist;HOB elevated General bed mobility comments: relied heavily on handrails; (a) to elevate trunk; pt with posterior bias Transfers Overall transfer level: Needs assistance Equipment used: Rolling walker (2  wheeled) Transfers: Sit to/from Stand Sit to Stand: Mod assist General transfer comment: pt with incr difficulty acheving sit to stand; required mod (A) and max directional cues to rock anteriorly and weightshift onto feet; max cues for hand placement and sequencing Ambulation/Gait Ambulation/Gait assistance: Min assist Ambulation Distance (Feet): 30 Feet Assistive device: Rolling walker (2 wheeled) Gait Pattern/deviations: Decreased stride length;Step-through pattern;Drifts right/left;Wide base of support;Shuffle Gait velocity: very decreased Gait velocity interpretation: <1.8 ft/sec, indicative of risk for recurrent falls General Gait Details: pt with difficulty managing RW and maintaining balance; pt becomes distracted and has demos slow response to commands and has difficulty problem solving; pt denied any dizziness. pt with LOB and required (A) to recover     Exercises General Exercises - Lower Extremity Ankle Circles/Pumps: PROM;Both;10 reps;Seated   PT Diagnosis:    PT Problem List:   PT Treatment Interventions:     PT Goals (current goals can now be found in the care plan section) Acute Rehab PT Goals Patient Stated Goal: none stated PT Goal Formulation: With patient Time For Goal Achievement: 04/17/13 Potential to Achieve Goals: Fair  Visit Information  Last PT Received On: 04/05/13 Assistance Needed: +1 History of Present Illness: Ms. Mallorie is a 80 yo woman with history of HTN, DM, CVA, CHF, meningioma s/p craniotomy with tumor excision (03/02/13) presented on 04/03/13 with complaints of AMS.  Per ED reports, daughter who was at bedside noted patient to be somnolent, confused, weak (not ambulating) & sluggish on the day prior to admission.  She seems to complain of vague pain, but she has not been able  to localize.    Subjective Data  Subjective: Pt lying supine; soiled with BM and urine Patient Stated Goal: none stated   Cognition  Cognition Arousal/Alertness:  Awake/alert Behavior During Therapy: Flat affect Overall Cognitive Status: Impaired/Different from baseline Area of Impairment: Orientation;Following commands;Problem solving Orientation Level: Disoriented to;Time;Situation;Place Memory: Decreased short-term memory Following Commands: Follows one step commands with increased time Safety/Judgement: Decreased awareness of deficits Problem Solving: Slow processing;Decreased initiation;Requires verbal cues;Requires tactile cues General Comments: continues to be delayed with answering questions; no famiy present     Balance  Balance Overall balance assessment: Needs assistance;History of Falls Sitting-balance support: Feet supported;Single extremity supported Sitting balance-Leahy Scale: Fair Sitting balance - Comments: posterior bias; +sway  Postural control: Posterior lean Standing balance support: During functional activity;Bilateral upper extremity supported Standing balance-Leahy Scale: Poor Standing balance comment: bil UE supported by RW  End of Session PT - End of Session Equipment Utilized During Treatment: Gait belt Activity Tolerance: Patient tolerated treatment well Patient left: in chair;with call bell/phone within reach;with chair alarm set Nurse Communication: Mobility status;Precautions   GP     Gustavus Bryant, [place] 04/05/2013, 11:55 AM

## 2013-04-05 NOTE — ED Notes (Signed)
IV team paged.  

## 2013-04-05 NOTE — Plan of Care (Signed)
Problem: Progression Outcomes Goal: Tolerating diet/TF at goal rate Outcome: Not Progressing Patient has a poor appetite.

## 2013-04-05 NOTE — ED Provider Notes (Signed)
CSN: [id number]     Arrival date & time 04/05/13  2209 History   First MD Initiated Contact with Patient 04/05/13 2228     Chief Complaint  Patient presents with  . Altered Mental Status  . Code Sepsis     (Consider location/radiation/quality/duration/timing/severity/associated sxs/prior Treatment) Patient is a 80 y.o. female presenting with altered mental status.  Altered Mental Status Presenting symptoms: confusion and partial responsiveness   Severity:  Severe Episode history:  Single Duration:  5 hours Timing:  Constant Progression:  Unchanged Chronicity:  New Context comment:  Discharged from the hospital today after hospitalization for altered mental status and UTI Associated symptoms: fever   Associated symptoms: no abdominal pain, no nausea and no vomiting     Past Medical History  Diagnosis Date  . Diabetes mellitus 2007    HgA1C (02/20/2010) = 9.2, HgA1C (03/20/2009) = 12.1  . Hyperlipidemia   . Hypertension   . GERD (gastroesophageal reflux disease)   . CVA (cerebrovascular accident) 5465     right embolic stroke, no residual deficits  . Diverticulitis   . CVA (cerebral infarction) 7-yrs ago  . Arthritis   . Dysphagia   . CHF (congestive heart failure)   . DIABETES MELLITUS, TYPE II 12/04/2005  . HYPERLIPIDEMIA 12/04/2005  . HYPERTENSION 12/04/2005  . GERD 12/04/2005  . ARTHRITIS, KNEE 03/24/2006  . Lung mass 07/08/2010  . Meningioma 07/21/2010  . Hemangioma of liver 12/02/2010  . Depression 12/02/2010  . Adenocarcinoma of lung      Right upper lobe adenocarcinoma. s/p right lower lobectomy 12/15/10   Past Surgical History  Procedure Laterality Date  . Abdominal hysterectomy    . Video bronchoscope.  12/29/2007    [place]  . Wide excision of left upper back mass.    . Extracapsular cataract extraction with intraocular      lens implantation.  . Right vats,right thoracotomy,right lower lobectomy with node dissection    . Incision and drainage perirectal  abscess N/A 01/19/2013    Procedure: IRRIGATION AND DEBRIDEMENT PERIRECTAL ABSCESS;  Surgeon: Zenovia Jarred, MD;  Location: [facility];  Service: General;  Laterality: N/A;  . Craniotomy N/A 03/02/2013    Procedure: CRANIOTOMY TUMOR EXCISION;  Surgeon: Winfield Cunas, MD;  Location: [facility];  Service: Neurosurgery;  Laterality: N/A;  Bifrontal Craniotomy for tumor   Family History  Problem Relation Age of Onset  . Hyperlipidemia Brother   . Hypertension Brother   . Diabetes Brother    History  Substance Use Topics  . Smoking status: Former Smoker    Types: Cigarettes    Quit date: 02/17/2000  . Smokeless tobacco: Never Used  . Alcohol Use: No   OB History   Grav Para Term Preterm Abortions TAB SAB Ect Mult Living                 Review of Systems  Constitutional: Positive for fever.  HENT: Negative for congestion.   Respiratory: Positive for cough. Negative for shortness of breath.   Cardiovascular: Negative for chest pain.  Gastrointestinal: Negative for nausea, vomiting, abdominal pain and diarrhea.  Psychiatric/Behavioral: Positive for confusion.  All other systems reviewed and are negative.      Allergies  Review of patient's allergies indicates no known allergies.  Home Medications   Current Outpatient Rx  Name  Route  Sig  Dispense  Refill  . albuterol (PROVENTIL HFA;VENTOLIN HFA) 108 (90 BASE) MCG/ACT inhaler   Inhalation   Inhale 2 puffs into  the lungs every 4 (four) hours as needed for wheezing.   3 Inhaler   1   . albuterol (PROVENTIL) (2.5 MG/3ML) 0.083% nebulizer solution   Nebulization   Take 3 mLs (2.5 mg total) by nebulization every 6 (six) hours as needed for wheezing.   75 mL   12   . amLODipine (NORVASC) 10 MG tablet   Oral   Take 1 tablet (10 mg total) by mouth daily.   30 tablet   3   . aspirin 81 MG tablet   Oral   Take 81 mg by mouth daily.          . Blood Glucose Monitoring Suppl (ACCU-CHEK AVIVA PLUS) W/DEVICE KIT   Does  not apply   1 each by Does not apply route 3 (three) times daily. dx code 250.00 insulin requiring   1 kit   1   . carvedilol (COREG) 25 MG tablet   Oral   Take 1 tablet (25 mg total) by mouth 2 (two) times daily with a meal.   180 tablet   4   . [employer] LANCETS MICRO THIN 33G MISC      Check blood sugar 3 times a day before meals dx code 250.00 insulin requiring   100 each   12   . docusate sodium (COLACE) 100 MG capsule   Oral   Take 1 capsule (100 mg total) by mouth daily as needed.   30 capsule   1   . furosemide (LASIX) 20 MG tablet   Oral   Take 1 tablet (20 mg total) by mouth daily.   90 tablet   0   . glucose blood (ACCU-CHEK AVIVA PLUS) test strip      Check blood sugar 3 times a day before meals dx code 250.00 insulin requiring   100 each   12   . insulin aspart (NOVOLOG FLEXPEN) 100 UNIT/ML FlexPen      Take three times with meals  <70    2 units if going to eat- skip if going to skip meal   71-140    4 units Novolog   141-210  6 units Novolog   211-280  8 units   15 mL   11   . Insulin Glargine (LANTUS SOLOSTAR) 100 UNIT/ML Solostar Pen   Subcutaneous   Inject 25 Units into the skin at bedtime.   10 mL   5   . iron polysaccharides (NIFEREX) 150 MG capsule   Oral   Take 1 capsule (150 mg total) by mouth daily.   30 capsule   6   . Lancet Devices (ACCU-CHEK SOFTCLIX) lancets      Check blood sugar 3 times a day before meals dx code 250.00 insulin requiring   100 each   12   . levETIRAcetam (KEPPRA) 500 MG tablet   Oral   Take 1 tablet (500 mg total) by mouth 2 (two) times daily.   60 tablet   11   . pantoprazole (PROTONIX) 20 MG tablet   Oral   Take 20 mg by mouth daily.         . simvastatin (ZOCOR) 20 MG tablet   Oral   Take 20 mg by mouth daily.         Marland Kitchen triamcinolone ointment (KENALOG) 0.1 %   Topical   Apply 1 application topically daily as needed (for skin irritaion).          BP 136/77  Temp(Src) 101.6 F (  38.7 C)  (Rectal)  Resp 23  SpO2 91% Physical Exam  Nursing note and vitals reviewed. Constitutional: She appears well-developed and well-nourished. She appears distressed.  HENT:  Head: Normocephalic and atraumatic.  Mouth/Throat: Oropharynx is clear and moist.  Eyes: Conjunctivae are normal. Pupils are equal, round, and reactive to light. No scleral icterus.  Neck: Neck supple.  Cardiovascular: Normal rate, regular rhythm, normal heart sounds and intact distal pulses.   No murmur heard. Pulmonary/Chest: No stridor. Tachypnea noted. She is in respiratory distress. She has rales in the right lower field and the left lower field.  Abdominal: Soft. Bowel sounds are normal. She exhibits no distension. There is no tenderness.  Musculoskeletal: Normal range of motion.  Neurological: She is disoriented. GCS eye subscore is 4. GCS verbal subscore is 3. GCS motor subscore is 5.  Skin: Skin is warm and dry. No rash noted.  Psychiatric: She has a normal mood and affect. Her behavior is normal.    ED Course  Procedures (including critical care time) Labs Review Labs Reviewed  CBC WITH DIFFERENTIAL - Abnormal; Notable for the following:    WBC 12.2 (*)    RBC 3.44 (*)    Hemoglobin 8.7 (*)    HCT 27.7 (*)    MCH 25.3 (*)    RDW 17.2 (*)    Platelets 731 (*)    Neutrophils Relative % 78 (*)    Neutro Abs 9.5 (*)    Monocytes Absolute 1.2 (*)    All other components within normal limits  COMPREHENSIVE METABOLIC PANEL - Abnormal; Notable for the following:    Glucose, Bld 202 (*)    Albumin 2.7 (*)    GFR calc non Af Amer 59 (*)    GFR calc Af Amer 68 (*)    All other components within normal limits  PRO B NATRIURETIC PEPTIDE - Abnormal; Notable for the following:    Pro B Natriuretic peptide (BNP) 3561.0 (*)    All other components within normal limits  POCT I-STAT 3, BLOOD GAS (G3+) - Abnormal; Notable for the following:    pCO2 arterial 34.6 (*)    pO2, Arterial 41.0 (*)    All other  components within normal limits  CULTURE, BLOOD (ROUTINE X 2)  CULTURE, BLOOD (ROUTINE X 2)  URINE CULTURE  URINALYSIS, ROUTINE W REFLEX MICROSCOPIC  BLOOD GAS, ARTERIAL  CG4 I-STAT (LACTIC ACID)   Imaging Review Dg Chest Port 1 View  (if Code Sepsis Called)  04/05/2013   CLINICAL DATA:  Sepsis.  Altered mental status.  EXAM: PORTABLE CHEST - 1 VIEW  COMPARISON:  DG CHEST 2 VIEW dated 04/03/2013; DG CHEST 1V PORT dated 02/25/2013; CT CHEST W/CM dated 05/07/2012  FINDINGS: Heart is borderline enlarged. Mild vascular congestion. Vague opacity throughout much of the left lung. Cannot exclude infiltrate/pneumonia. Patchy right perihilar opacity also noted. No effusions. No acute bony abnormality.  IMPRESSION: Vague diffuse opacities throughout the left lung and in the right perihilar region. Cannot exclude infection. Asymmetric edema possible blood fell less likely.  Mild cardiomegaly.   Electronically Signed   By: Rolm Baptise M.D.   On: 04/05/2013 23:09  All radiology studies independently viewed by me.     EKG Interpretation   None       MDM   Final diagnoses:  HCAP (healthcare-associated pneumonia)    80 yo female who was recently DC'd from hospital presenting with fever, hypoxia, and AMS.  Per family report, she came home, walked up  3 steps, became short of breath, and never recovered her breath.  Mental status improved after oxygen application by EMS.  In ED, placed on NRB with improvement in respiratory status and mental status.  ABG appeared mixed, but without significant hypercarbia.  Given Vanco and Zosyn to cover for HCAP.  Discussed with internal medicine for admission to [facility] unit.      Houston Siren III, MD 04/06/13 1256

## 2013-04-05 NOTE — Progress Notes (Signed)
Subjective:   Pt has no new complaints this AM.  Pt is feeling better and did not have any events overnight. She is alert and oriented X 3.   Objective:   Vital signs in last 24 hours: Filed Vitals:   04/05/13 0500 04/05/13 0608 04/05/13 0933 04/05/13 1430  BP:  121/53 110/56 122/59  Pulse:  83 81 86  Temp:  98.5 F (36.9 C) 98.4 F (36.9 C) 97.5 F (36.4 C)  TempSrc:  Oral Oral Oral  Resp:  16 18 18   Height:      Weight: 191 lb 9.6 oz (86.909 kg)     SpO2:   94% 97%   Weight change: 1 lb 4.8 oz (0.59 kg)  Intake/Output Summary (Last 24 hours) at 04/05/13 1643 Last data filed at 04/04/13 1730  Gross per 24 hour  Intake    120 ml  Output      0 ml  Net    120 ml    Physical Exam: Constitutional: Vital signs reviewed.  Patient is in no acute distress and cooperative with exam.   Head: Normocephalic and atraumatic Eyes: EOMI, conjunctivae normal, no scleral icterus  Neck: Supple, Trachea midline Cardiovascular: RRR, S1, S2 present, no MRG, DP 2+ b/l Pulmonary/Chest: normal respiratory effort, CTAB, no wheezes, rales, or rhonchi Abdominal: Soft. +BS, Non-tender, non-distended, no suprapubic tenderness Neurological: A&O x3, cranial nerve II-XII are grossly intact, moving all extremities  Skin: Warm, dry and intact.  Psychiatric: Normal mood and affect.   Lab Results:  BMP:  Recent Labs Lab 04/03/13 0057 04/04/13 0535  NA 143 146  K 3.5* 4.4  CL 104 110  CO2 26 24  GLUCOSE 115* 131*  BUN 16 18  CREATININE 0.84 0.89  CALCIUM 8.4 8.5  MG 1.8  --   PHOS 2.5  --     CBC:  Recent Labs Lab 04/03/13 0056  04/04/13 1405 04/05/13 0645  WBC 11.3*  < > 11.9* 10.8*  NEUTROABS 7.9*  --   --  7.3  HGB 8.2*  < > 7.6* 7.5*  HCT 25.9*  < > 24.4* 23.8*  MCV 81.2  < > 82.2 81.2  PLT 546*  < > 585* 568*  < > = values in this interval not displayed.  Coagulation: No results found for this basename: LABPROT, INR,  in the last 168  hours  CBG:            Recent Labs Lab 04/04/13 0643 04/04/13 1128 04/04/13 1717 04/04/13 2110 04/05/13 0657 04/05/13 1116  GLUCAP 131* 139* 152* 131* 140* 128*           HA1C:      No results found for this basename: HGBA1C,  in the last 168 hours  Lipid Panel: No results found for this basename: CHOL, HDL, LDLCALC, TRIG, CHOLHDL, LDLDIRECT,  in the last 168 hours  LFTs:  Recent Labs Lab 04/03/13 0057  AST 11  ALT 10  ALKPHOS 79  BILITOT <0.2*  PROT 6.3  ALBUMIN 2.5*    Pancreatic Enzymes: No results found for this basename: LIPASE, AMYLASE,  in the last 168 hours  Ammonia: No results found for this basename: AMMONIA,  in the last 168 hours  Cardiac Enzymes:  Recent Labs Lab 04/03/13 0057  TROPONINI <0.30   Lab Results  Component Value Date   CKTOTAL 34 02/25/2013   CKMB 2.6 11/10/2010   TROPONINI <0.30 04/03/2013    EKG:  Date/Time:  Monday April 03 2013 01:44:14 EST Ventricular Rate:  102 PR Interval:  162 QRS Duration: 124 QT Interval:  370 QTC Calculation: 482 R Axis:   -50 Text Interpretation:  Sinus tachycardia Nonspecific IVCD with LAD LVH with secondary repolarization abnormality ED PHYSICIAN INTERPRETATION AVAILABLE IN [place] Confirmed by TEST, RECORD ([id number]) on 04/05/2013 7:14:16 AM  BNP: No results found for this basename: PROBNP,  in the last 168 hours  D-Dimer: No results found for this basename: DDIMER,  in the last 168 hours  Urinalysis:  Recent Labs Lab 04/03/13 0238  COLORURINE YELLOW  LABSPEC 1.026  PHURINE 5.0  GLUCOSEU NEGATIVE  HGBUR MODERATE*  BILIRUBINUR NEGATIVE  KETONESUR NEGATIVE  PROTEINUR 100*  UROBILINOGEN 0.2  NITRITE NEGATIVE  LEUKOCYTESUR LARGE*    Micro Results: Recent Results (from the past 240 hour(s))  URINE CULTURE     Status: None   Collection Time    03/28/13  5:15 PM      Result Value Ref Range Status   Colony Count >=100,000 COLONIES/ML   Final   Organism ID, Bacteria  Multiple bacterial morphotypes present, none   Final   Organism ID, Bacteria predominant. Suggest appropriate recollection if    Final   Organism ID, Bacteria clinically indicated.   Final  URINE CULTURE     Status: None   Collection Time    04/03/13  2:38 AM      Result Value Ref Range Status   Specimen Description URINE, RANDOM   Final   Special Requests NONE   Final   Culture  Setup Time     Final   Value: 04/03/2013 03:07     Performed at [place]     Final   Value: NO GROWTH     Performed at [facility]   Culture     Final   Value: NO GROWTH     Performed at [facility]   Report Status 04/03/2013 FINAL   Final    Blood Culture:    Component Value Date/Time   SDES URINE, RANDOM 04/03/2013 0238   SPECREQUEST NONE 04/03/2013 0238   CULT  Value: NO GROWTH Performed at [facility] 04/03/2013 0238   REPTSTATUS 04/03/2013 FINAL 04/03/2013 0238    Studies/Results: No results found.  Medications:  Scheduled Meds: . aspirin  81 mg Oral Daily  . carvedilol  25 mg Oral BID WC  . docusate sodium  100 mg Oral BID  . feeding supplement (ENSURE)  1 Container Oral Q24H  . feeding supplement (GLUCERNA SHAKE)  237 mL Oral BID BM  . insulin aspart  0-9 Units Subcutaneous TID WC  . insulin glargine  5 Units Subcutaneous QHS  . iron polysaccharides  150 mg Oral Daily  . levETIRAcetam  500 mg Oral BID  . pantoprazole  20 mg Oral Daily  . simvastatin  20 mg Oral q1800   Continuous Infusions:  PRN Meds:.acetaminophen, albuterol  Antibiotics: Anti-infectives   Start     Dose/Rate Route Frequency Ordered Stop   04/04/13 0600  cefTRIAXone (ROCEPHIN) 1 g in dextrose 5 % 50 mL IVPB  Status:  Discontinued     1 g 100 mL/hr over 30 Minutes Intravenous Every 24 hours 04/03/13 0836 04/04/13 0740   04/03/13 1000  fluconazole (DIFLUCAN) tablet 150 mg  Status:  Discontinued     150 mg Oral  Once 04/03/13 0831 04/03/13 0852   04/03/13 0345   cefTRIAXone (ROCEPHIN) 1 g in dextrose 5 %  50 mL IVPB     1 g 100 mL/hr over 30 Minutes Intravenous  Once 04/03/13 3557 04/03/13 1106     Antibiotics Given (last 72 hours)   Date/Time Action Medication Dose Rate   04/04/13 0559 Given   cefTRIAXone (ROCEPHIN) 1 g in dextrose 5 % 50 mL IVPB 1 g 100 mL/hr      Day of Hospitalization:  2 Consults:    Assessment/Plan:   Principal Problem:   Acute encephalopathy Active Problems:   DIABETES MELLITUS, TYPE II   HYPERTENSION   Chronic systolic congestive heart failure   Risk for falls   UTI (lower urinary tract infection)   Hypokalemia  #Acute encephalopathy - Resolved. -hold narcotics indefinitely -continue keppra for now and monitor clinically; follow-up with neurosurgery for continued use of keppra given side effect profile -continue PT/OT  #Hypokalemia: Resolved; likely related to lasix. Mg wnl.    #Microcytic Anemia: No obvious s/s of bleeding, Baseline Hb ~10. Pt has thrombocytosis.  History of mild antral gastritis in 2012. 2004 Colonoscopy revealed diverticulosis.  CBC has remained stable during admission although below her baseline.  Pt iron is low, ferritin wnl but is an acute phase reactant so most likely false normal.   Lab Results  Component Value Date   IRON <10* 04/04/2013   TIBC NOT CALC 04/04/2013   FERRITIN 208 04/04/2013   -FOBT x 3 pending  -Cont home PPI  -niferex 150mg  daily -colace 100mg  bid -ferric gluconate 125mg  IV x 1 -repeat CBC at hospital f/u appointment -consider f/u with GI as outpatient  #Uncontrolled DMII: Most recent A1c 9.7 on 03/28/13. Home regimen includes lantus 25u qHS and SSI aspart.  -carb modified diet -SSI, sensitive  -continue reduced lantus of 5u qHS - escalate as indicated   CBG (last 3)   Recent Labs  04/04/13 2110 04/05/13 0657 04/05/13 1116  GLUCAP 131* 140* 128*   #HTN: Normotensive at admission. Home regimen includes amlodipine 10, coreg 25 bid, lasix 20.   However, BP has been low today.  -continue coreg -restart home meds upon discharge; re-evaluate BP meds at next visit  #Chronic systolic HF: Myoview from 12/12/10 revealed EF 40% with diffuse hyopkinesis & abnl septal motion  -Appears euvolemic on exam, continue BB at admissin  -continue daily weights  -To note, ACEI was d/c at 01/22/13 discharge d/t AKI, consider restarting at hospital f/u  #Seizure disorder: due to meningioma, now s/p excision by craniotomy  -Continue Keppra 500 bid -follow-up with neurosurgery if continuing keppra indefintely  #H/o CVA: Nonfocal exam, continue ASA 81 & statin. Pt may have underlying vascular dementia that is contributing to her periods of forgetfulness.   #Persistent hematuria: Pt has been referred to urology in the past for chronic bladder outlet obstruction but has not gone.  May be related to UTI but pt does have a h/o smoking.  Should consider follow up with urology as outpatient.   #VTE ppx: SCDs given recent brain surgery & Hb below baseline   #Code Status : full (per prior records, when I asked patient, she responded "I really don't know")  #Dispo- Anticipated discharge today pending iron infusion.    LOS: 2 days   Michail Jewels, MD PGY-1, Internal Medicine  [phone] (7AM-5PM Mon-Fri) 04/05/2013, 4:43 PM

## 2013-04-05 NOTE — Discharge Instructions (Signed)
Your current medical regimen is effective;  continue present plan. Please do not take anymore norco or pain medication except for tylenol.  I have sent a prescription for niferex, which is iron to your pharmacy.  Please take this daily. I have also sent you a prescription for colace twice daily to help with constipation as iron can cause constipation.  Please follow up with neurosurgery to inquire Ms. Grall should still be taking keppra for seizure prophylaxis.   Please be sure to bring all of your medications with you to every visit.  Should you have any new or worsening symptoms, please be sure to call the clinic at [phone].  If you believe that you are suffering from a life threatening condition or one that may result in the loss of limb or function, then you should call 911 or proceed to the nearest Emergency Department.

## 2013-04-05 NOTE — Discharge Summary (Signed)
Name: [name] MRN: [id number] DOB: Jul 14, 1933 80 y.o. PCP: Jerene Pitch, MD . Date of Admission: 04/03/2013 12:38 AM Date of Discharge: 04/05/2013 Attending Physician: Dr. Thayer Headings, MD  Discharge Diagnosis: Principal Problem:   Acute encephalopathy Active Problems:   DIABETES MELLITUS, TYPE II   HYPERTENSION   Chronic systolic congestive heart failure   Risk for falls   Hypokalemia  Discharge Medications:   Medication List    STOP taking these medications       ciprofloxacin 500 MG tablet  Commonly known as:  CIPRO     HYDROcodone-acetaminophen 5-325 MG per tablet  Commonly known as:  NORCO/VICODIN     Loratadine 10 MG Caps      TAKE these medications       ACCU-CHEK AVIVA PLUS W/DEVICE Kit  1 each by Does not apply route 3 (three) times daily. dx code 250.00 insulin requiring     accu-chek softclix lancets  Check blood sugar 3 times a day before meals dx code 250.00 insulin requiring     albuterol (2.5 MG/3ML) 0.083% nebulizer solution  Commonly known as:  PROVENTIL  Take 3 mLs (2.5 mg total) by nebulization every 6 (six) hours as needed for wheezing.     albuterol 108 (90 BASE) MCG/ACT inhaler  Commonly known as:  PROVENTIL HFA;VENTOLIN HFA  Inhale 2 puffs into the lungs every 4 (four) hours as needed for wheezing.     amLODipine 10 MG tablet  Commonly known as:  NORVASC  Take 1 tablet (10 mg total) by mouth daily.     aspirin 81 MG tablet  Take 81 mg by mouth daily.     carvedilol 25 MG tablet  Commonly known as:  COREG  Take 1 tablet (25 mg total) by mouth 2 (two) times daily with a meal.     [employer] LANCETS MICRO THIN 33G Misc  Check blood sugar 3 times a day before meals dx code 250.00 insulin requiring     docusate sodium 100 MG capsule  Commonly known as:  COLACE  Take 1 capsule (100 mg total) by mouth daily as needed.     furosemide 20 MG tablet  Commonly known as:  LASIX  Take 1 tablet (20 mg total) by mouth daily.     glucose blood test strip  Commonly known as:  ACCU-CHEK AVIVA PLUS  Check blood sugar 3 times a day before meals dx code 250.00 insulin requiring     insulin aspart 100 UNIT/ML FlexPen  Commonly known as:  NOVOLOG FLEXPEN  - Take three times with meals   - <70    2 units if going to eat- skip if going to skip meal   -  71-140    4 units Novolog   -  141-210  6 units Novolog   -  211-280  8 units     Insulin Glargine 100 UNIT/ML Solostar Pen  Commonly known as:  LANTUS SOLOSTAR  Inject 25 Units into the skin at bedtime.     iron polysaccharides 150 MG capsule  Commonly known as:  NIFEREX  Take 1 capsule (150 mg total) by mouth daily.     levETIRAcetam 500 MG tablet  Commonly known as:  KEPPRA  Take 1 tablet (500 mg total) by mouth 2 (two) times daily.     pantoprazole 20 MG tablet  Commonly known as:  PROTONIX  Take 20 mg by mouth daily.     simvastatin 20 MG tablet  Commonly  known as:  ZOCOR  Take 20 mg by mouth daily.     triamcinolone ointment 0.1 %  Commonly known as:  KENALOG  Apply 1 application topically daily as needed (for skin irritaion).        Disposition and follow-up:   [name] was discharged from [facility] in Stable condition.  Please address the following problems:  1. Continued AMS/confusion  2. Continued need for keppra (may be contributing to confusion/lethargy/follow-up with neurosurgery/radiation oncology  Labs / imaging needed at time of follow-up: CBC, BMP  Pending labs/ test needing follow-up:    Follow-up Appointments: Follow-up Information   Follow up with Lora Paula, MD On 04/10/2013. (10:30AM)    Specialty:  Radiation Oncology   Contact information:   [address] [place] [address] [phone]       Follow up with Ivor Costa, MD On 04/12/2013. (9:00AM)    Specialty:  Internal Medicine   Contact information:   [place] [address] [phone]        Discharge Instructions: Discharge Orders   Future Appointments Provider Department Dept Phone   04/10/2013 10:30 AM [facility] [place] [facility] [phone]   04/10/2013 11:00 AM Lora Paula, MD [place] [facility] [phone]   04/12/2013 9:00 AM Ivor Costa, MD [place] [phone]   05/03/2013 11:20 AM Zenovia Jarred, MD [facility], [place] [phone]   Future Orders Complete By Expires   (HEART FAILURE PATIENTS) Call MD:  Anytime you have any of the following symptoms: 1) 3 pound weight gain in 24 hours or 5 pounds in 1 week 2) shortness of breath, with or without a dry hacking cough 3) swelling in the hands, feet or stomach 4) if you have to sleep on extra pillows at night in order to breathe.  As directed    Call MD for:  difficulty breathing, headache or visual disturbances  As directed    Diet - low sodium heart healthy  As directed    Increase activity slowly  As directed       Consultations:  None  Procedures Performed:  Dg Chest 2 View  04/03/2013   CLINICAL DATA:  ALTERED MENTAL STATUS, RIGHT LUNG CANCER  EXAM: CHEST  2 VIEW  COMPARISON:  DG CHEST 1V PORT dated 03/02/2013; CT CHEST W/CM dated 05/07/2012  FINDINGS: MILDLY ENLARGED CARDIAC SILHOUETTE. THERE IS ATELECTASIS IN THE RIGHT MID LUNG UNCHANGED FROM PRIOR. NO FOCAL CONSOLIDATION. NO PNEUMOTHORAX. NO PLEURAL FLUID.  IMPRESSION: 1. No clear acute findings. 2. Right lung atelectasis.   Electronically Signed   By: Suzy Bouchard M.D.   On: 04/03/2013 01:45   Ct Head Wo Contrast  04/03/2013   CLINICAL DATA:  Altered mental status  EXAM: CT HEAD WITHOUT CONTRAST  TECHNIQUE: Contiguous axial images were obtained from the base of the skull through the vertex without intravenous contrast.  COMPARISON:  Prior MRI from 02/26/2013  FINDINGS: Postoperative changes from recent right frontal craniotomy with resection of large right  frontal meningioma again seen. A hypodense extra-axial fluid collection measuring 1 cm in diameter overlies the operative site subjacent to the craniotomy bone flap. A few scattered foci of pneumocephalus persists within this region. There is encephalomalacia within the subjacent right frontal lobe. Small linear hyperdensity within the lateral right frontal lobe is likely related to beam hardening artifact from overlying postsurgical changes at the anterior cranial vault. Previously seen right-to-left  midline shift has resolved. Ventricles are stable without evidence of hydrocephalus.  Remote right frontal and right basal ganglia infarcts again noted. No new large vessel territory infarct. No acute intracranial hemorrhage.  No acute abnormality seen within the calvarium. Orbits are normal. Paranasal sinuses and mastoid air cells are clear.  IMPRESSION: 1. Postoperative changes from recent right frontal craniotomy with resection of previously identified right frontal meningioma. 2. Hypodense subdural fluid collection measuring 1 cm in maximal diameter subjacent to the craniotomy bone flap overlying the right frontal lobe. This collection is within normal limits for normal expected postoperative changes. 3. No acute intracranial hemorrhage or infarct identified. 4. Remote right frontal and right basal ganglia lacunar infarcts.   Electronically Signed   By: Jeannine Boga M.D.   On: 04/03/2013 02:48    2D Echo: N/A  Cardiac Cath: N/A  Admission HPI: [name] is a 80 yo woman with history of HTN, DM, CVA, CHF, meningioma s/p craniotomy with tumor excision (03/02/13) presented on 04/03/13 with complaints of AMS. Per ED reports, daughter who was at bedside noted patient to be somnolent, confused, weak (not ambulating) & sluggish on the day prior to admission. She seems to complain of vague pain, but she has not been able to localize. No falls. No fevers.  I examined patient on the floor, daughter was not  at bedside (I called number on file, no answer). Upon my questioning, she endorsed dysuria & urinary incontinence (unable to describe incontinence further), but denied any pain. She also denied HA, dizziness, difficulty swallowing, chest pain, heart palpitations, difficulty breathing, abdominal pain, diarrhea, constipation (reported last BM was Saturday).  Patient admits to living with her daughter, who helps her with most ADLs including bathing, dressing, med mgmt. She walks with a walker, and thinks she had a recent fall in the last week, but did not hit her head.  To note, patient was seen in clinic on 03/28/13 with complaints of dysuria x 1 week. She has had history of multiple UTIs in the past, at noted similar symptoms - PCP subsequently ordered cipro 537m bid x 7d.  Patient was most recently hospitalized 02/25/13-03/07/13 due to new onset seizures due to a 7cm cerebral right atypical meningioma requring craniotomy for tumor excision.  In the ED, she was treated with 2g rocephin.   Hospital Course by problem list: Principal Problem:   Acute encephalopathy Active Problems:   DIABETES MELLITUS, TYPE II   HYPERTENSION   Chronic systolic congestive heart failure   Risk for falls   Hypokalemia   #Acute encephalopathy - Responding appropriately, albeit slowly, on my exam. Broad differential, but given recent complaints at PCP visit and current UA/micro, most likely infectious d/t UTI (notably, cx from 03/28/13 visit grew >100,000 mult bacterial morphotypes, suggest recollection). Her last urine culture that isolated an organism was from 09/07/11 (e.coli at that time was sensitive to both cipro and ceftriazone). Urine micro suggests few yeast - no obvious vaginal yeast on my exam, but she does have a moisture related rash/fungal rash on her medial thighs (to note, this rash has been biopsied at 01/2013 hospitalization - spongiotic dermatitis, drug rxn vs psoriasis). I'm not sure if urine sample was  catheterized. To note, she has history of chronic bladder outlet obstruction, and urology referral placed on 01/30/13- booked until March 2015). Other infectious etiologies include meningitis/encephalitis given recent craniotomy or bacteremia (though less likely given history, no focal deficits and no fever). Medications may also be the culprit, as she  has narcotics on board. She may be post-ictal given recent brain surgery, or keppra may be causing lethargy. Head CT unrevealing. Labs not suggestive of hypoglycemia.  She was admitted and observed for improvement.  We held narcotics and pt mental status had improved the next morning when I saw her.  She was alert and oriented X3.  The daughter stated she had given her a pain pill prior to her decline in mental status.  In addition, during a previous admission, the daughter stated that on occasion she notices that her mother "stares into space" at time.  We continued her keppra and was evaluated by PT which recommended SNF.  I spoke with the daughter who wants to take care of her at home and declined SNF.  She states she is able to take care of her mother at home. She was discharged on 2/18 in stable condition with mental status back to baseline.  -continue keppra for now and monitor clinically; follow-up with neurosurgery for continued use of keppra given side effect profile   #Hypokalemia: Resolved; likely related to lasix. Mg wnl.   #Microcytic Anemia: No obvious s/s of bleeding, Baseline Hb ~10. Pt has thrombocytosis. History of mild antral gastritis in 2012. 2004 Colonoscopy revealed diverticulosis. CBC has remained stable during admission although below her baseline. Pt iron is low, ferritin wnl but is an acute phase reactant so most likely false normal.  Lab Results   Component  Value  Date    IRON  <10*  04/04/2013    TIBC  NOT CALC  04/04/2013    FERRITIN  208  04/04/2013    -FOBT x 3 pending  -Cont home PPI  -niferex 16m daily  -colace 1038m bid  -ferric gluconate 125108mV x 1  -repeat CBC at hospital f/u appointment  -consider f/u with GI as outpatient   #Uncontrolled DMII: Most recent A1c 9.7 on 03/28/13. Home regimen includes lantus 25u qHS and SSI aspart.  -carb modified diet  -SSI, sensitive  -continue reduced lantus of 5u qHS - escalate as indicated  Recent Labs   04/04/13 2110  04/05/13 0657  04/05/13 1116   GLUCAP  131*  140*  128*    #HTN: Normotensive at admission. Home regimen includes amlodipine 10, coreg 25 bid, lasix 20. However, BP has been low today.  -continue coreg  -restart home meds upon discharge; re-evaluate BP meds at next visit   #Chronic systolic HF: Myoview from 12/12/10 revealed EF 40% with diffuse hyopkinesis & abnl septal motion  -Appears euvolemic on exam, continue BB at admissin  -continue daily weights  -To note, ACEI was d/c at 01/22/13 discharge d/t AKI, consider restarting at hospital f/u   #Seizure disorder: due to meningioma, now s/p excision by craniotomy  -Continue Keppra 500 bid  -follow-up with neurosurgery if continuing keppra indefintely   #H/o CVA: Nonfocal exam, continue ASA 81 & statin. Pt may have underlying vascular dementia that is contributing to her periods of forgetfulness.   #Persistent hematuria: Pt has been referred to urology in the past for chronic bladder outlet obstruction but has not gone. May be related to UTI but pt does have a h/o smoking. Should consider follow up with urology as outpatient.   #VTE ppx: SCDs given recent brain surgery & Hb below baseline   Discharge Vitals:   BP 122/59  Pulse 86  Temp(Src) 97.5 F (36.4 C) (Oral)  Resp 18  Ht '5\' 4"'  (1.626 m)  Wt 191 lb 9.6 oz (86.909  kg)  BMI 32.87 kg/m2  SpO2 97%  Discharge Labs:  No results found for this or any previous visit (from the past 24 hour(s)).  Signed: Michail Jewels, MD [phone] 04/09/2013, 10:16 PM   Time Spent on Discharge: 54mnutes Services Ordered on Discharge:  None Equipment Ordered on Discharge: None

## 2013-04-05 NOTE — Progress Notes (Signed)
Critical PO2 values reported to MD. Decided mixed sample. Pt was put on NRB to increase PO2/Sats. Sats increased from 92% to 100%. Will reevaluate per MD.

## 2013-04-05 NOTE — Telephone Encounter (Signed)
  INTERNAL MEDICINE RESIDENCY PROGRAM After-Hours Telephone Call    Reason for call:   I received a call from [name] at 8:43 PM, 04/05/2013 indicating fatigue.    Pertinent Data:   The patient was discharged from the hospital earlier today, after a stay for UTI, with some mild associated confusion.  Her daughter now notes that she seems more "tired" after walking up the steps to her house, than she used to be prior to her hospitalization.  She notes no shortness of breath, cough, or altered mental status.    Assessment / Plan / Recommendations:   Given that the patient was recently hospitalized for 2 days, I believe deconditioning is playing a role in the patient's decreased exercise tolerance.  The daughter is instructed to monitor the patient's symptoms, and the patient has an appointment in [facility] on 2/25, where she can be further evaluated.  As always, pt is advised that if symptoms worsen or new symptoms arise, they should go to an urgent care facility or to to ER for further evaluation.    Hester Mates, MD   04/05/2013, 8:43 PM

## 2013-04-05 NOTE — Progress Notes (Signed)
Pt did not have bowel movement this shift. It was passed on to day shift for hemoccult. Will continue to monitor. Verdie Drown RN BSN

## 2013-04-05 NOTE — Progress Notes (Signed)
ANTIBIOTIC CONSULT NOTE - INITIAL  Pharmacy Consult for vancomycin + zosyn Indication: rule out sepsis  No Known Allergies  Patient Measurements:   Adjusted Body Weight:   Vital Signs: Temp: 101.6 F (38.7 C) (02/18 2212) Temp src: Rectal (02/18 2212) BP: 136/77 mmHg (02/18 2212) Pulse Rate: 86 (02/18 1430) Intake/Output from previous day:   Intake/Output from this shift:    Labs:  Recent Labs  04/03/13 0057  04/04/13 0535 04/04/13 1405 04/05/13 0645  WBC  --   < > 10.4 11.9* 10.8*  HGB  --   < > 7.6* 7.6* 7.5*  PLT  --   < > 590* 585* 568*  CREATININE 0.84  --  0.89  --   --   < > = values in this interval not displayed. The CrCl is unknown because both a height and weight (above a minimum accepted value) are required for this calculation. No results found for this basename: VANCOTROUGH, Corlis Leak, VANCORANDOM, GENTTROUGH, GENTPEAK, GENTRANDOM, TOBRATROUGH, TOBRAPEAK, TOBRARND, AMIKACINPEAK, AMIKACINTROU, AMIKACIN,  in the last 72 hours   Microbiology: Recent Results (from the past 720 hour(s))  URINE CULTURE     Status: None   Collection Time    03/28/13  5:15 PM      Result Value Ref Range Status   Colony Count >=100,000 COLONIES/ML   Final   Organism ID, Bacteria Multiple bacterial morphotypes present, none   Final   Organism ID, Bacteria predominant. Suggest appropriate recollection if    Final   Organism ID, Bacteria clinically indicated.   Final  URINE CULTURE     Status: None   Collection Time    04/03/13  2:38 AM      Result Value Ref Range Status   Specimen Description URINE, RANDOM   Final   Special Requests NONE   Final   Culture  Setup Time     Final   Value: 04/03/2013 03:07     Performed at [facility]     Final   Value: NO GROWTH     Performed at [facility]   Culture     Final   Value: NO GROWTH     Performed at [facility]   Report Status 04/03/2013 FINAL   Final    Medical History: Past  Medical History  Diagnosis Date  . Diabetes mellitus 2007    HgA1C (02/20/2010) = 9.2, HgA1C (03/20/2009) = 12.1  . Hyperlipidemia   . Hypertension   . GERD (gastroesophageal reflux disease)   . CVA (cerebrovascular accident) 2297     right embolic stroke, no residual deficits  . Diverticulitis   . CVA (cerebral infarction) 7-yrs ago  . Arthritis   . Dysphagia   . CHF (congestive heart failure)   . DIABETES MELLITUS, TYPE II 12/04/2005  . HYPERLIPIDEMIA 12/04/2005  . HYPERTENSION 12/04/2005  . GERD 12/04/2005  . ARTHRITIS, KNEE 03/24/2006  . Lung mass 07/08/2010  . Meningioma 07/21/2010  . Hemangioma of liver 12/02/2010  . Depression 12/02/2010  . Adenocarcinoma of lung      Right upper lobe adenocarcinoma. s/p right lower lobectomy 12/15/10    Medications:  Anti-infectives   Start     Dose/Rate Route Frequency Ordered Stop   04/06/13 1100  vancomycin (VANCOCIN) IVPB 750 mg/150 ml premix     750 mg 150 mL/hr over 60 Minutes Intravenous Every 12 hours 04/05/13 2242     04/06/13 0500  piperacillin-tazobactam (ZOSYN) IVPB 3.375 g  3.375 g 12.5 mL/hr over 240 Minutes Intravenous Every 8 hours 04/05/13 2242     04/05/13 2245  piperacillin-tazobactam (ZOSYN) IVPB 3.375 g     3.375 g 100 mL/hr over 30 Minutes Intravenous  Once 04/05/13 2234     04/05/13 2245  vancomycin (VANCOCIN) IVPB 1000 mg/200 mL premix     1,000 mg 200 mL/hr over 60 Minutes Intravenous  Once 04/05/13 2234       Assessment: 81 yof presented to the ED with increasing SOB and lethargy. She was just discharged this AM. Tmax is 101.6 and WBC was 10.8 this AM. Scr was 0.89 this AM.   Vanc 2/18>> Zosyn 2/18>>  Goal of Therapy:  Vancomycin trough level 15-20 mcg/ml  Plan:  1. Vancomycin 1gm IV x 1 then 750mg  IV Q12H 2. Zosyn 3.375gm IV x 1 over 30 minutes then 3.375gm IV Q8H (4 hr inf) 3. F/u renal fxn, C&S, clinical status and trough at [place], Rande Lawman 04/05/2013,10:42 PM

## 2013-04-05 NOTE — ED Notes (Signed)
Per EMS pt came from home where she was discharged from and admission today. Pt was discharged and became SOB and lethargic at home. Pt 81% on RA upon EMS arrival, pt 100% on NRB. Pt is normally alert, able to walk. Pt non-verbal with EMS

## 2013-04-06 ENCOUNTER — Inpatient Hospital Stay (HOSPITAL_COMMUNITY): Payer: Medicare Other

## 2013-04-06 ENCOUNTER — Encounter (HOSPITAL_COMMUNITY): Payer: Self-pay | Admitting: Internal Medicine

## 2013-04-06 DIAGNOSIS — G9340 Encephalopathy, unspecified: Secondary | ICD-10-CM | POA: Diagnosis not present

## 2013-04-06 DIAGNOSIS — J189 Pneumonia, unspecified organism: Secondary | ICD-10-CM

## 2013-04-06 DIAGNOSIS — I10 Essential (primary) hypertension: Secondary | ICD-10-CM

## 2013-04-06 DIAGNOSIS — I509 Heart failure, unspecified: Secondary | ICD-10-CM

## 2013-04-06 DIAGNOSIS — R509 Fever, unspecified: Secondary | ICD-10-CM

## 2013-04-06 DIAGNOSIS — E119 Type 2 diabetes mellitus without complications: Secondary | ICD-10-CM

## 2013-04-06 DIAGNOSIS — Z86711 Personal history of pulmonary embolism: Secondary | ICD-10-CM

## 2013-04-06 DIAGNOSIS — I5020 Unspecified systolic (congestive) heart failure: Secondary | ICD-10-CM

## 2013-04-06 DIAGNOSIS — D649 Anemia, unspecified: Secondary | ICD-10-CM

## 2013-04-06 DIAGNOSIS — I2699 Other pulmonary embolism without acute cor pulmonale: Secondary | ICD-10-CM | POA: Diagnosis not present

## 2013-04-06 DIAGNOSIS — E872 Acidosis, unspecified: Secondary | ICD-10-CM

## 2013-04-06 DIAGNOSIS — N390 Urinary tract infection, site not specified: Secondary | ICD-10-CM

## 2013-04-06 DIAGNOSIS — Z8673 Personal history of transient ischemic attack (TIA), and cerebral infarction without residual deficits: Secondary | ICD-10-CM

## 2013-04-06 DIAGNOSIS — D473 Essential (hemorrhagic) thrombocythemia: Secondary | ICD-10-CM

## 2013-04-06 DIAGNOSIS — K219 Gastro-esophageal reflux disease without esophagitis: Secondary | ICD-10-CM

## 2013-04-06 HISTORY — DX: Personal history of pulmonary embolism: Z86.711

## 2013-04-06 LAB — HEPARIN LEVEL (UNFRACTIONATED): Heparin Unfractionated: 0.1 IU/mL — ABNORMAL LOW (ref 0.30–0.70)

## 2013-04-06 LAB — GLUCOSE, CAPILLARY
GLUCOSE-CAPILLARY: 156 mg/dL — AB (ref 70–99)
GLUCOSE-CAPILLARY: 157 mg/dL — AB (ref 70–99)
Glucose-Capillary: 195 mg/dL — ABNORMAL HIGH (ref 70–99)
Glucose-Capillary: 194 mg/dL — ABNORMAL HIGH (ref 70–99)
Glucose-Capillary: 134 mg/dL — ABNORMAL HIGH (ref 70–99)

## 2013-04-06 LAB — URINALYSIS, ROUTINE W REFLEX MICROSCOPIC
Bilirubin Urine: NEGATIVE
Glucose, UA: NEGATIVE mg/dL
Ketones, ur: 15 mg/dL — AB
NITRITE: NEGATIVE
Protein, ur: 30 mg/dL — AB
Specific Gravity, Urine: 1.03 (ref 1.005–1.030)
UROBILINOGEN UA: 0.2 mg/dL (ref 0.0–1.0)
pH: 5.5 (ref 5.0–8.0)

## 2013-04-06 LAB — BASIC METABOLIC PANEL
BUN: 17 mg/dL (ref 6–23)
CHLORIDE: 107 meq/L (ref 96–112)
CO2: 21 meq/L (ref 19–32)
Calcium: 8.2 mg/dL — ABNORMAL LOW (ref 8.4–10.5)
Creatinine, Ser: 1.09 mg/dL (ref 0.50–1.10)
GFR calc Af Amer: 55 mL/min — ABNORMAL LOW (ref >90)
GFR calc non Af Amer: 48 mL/min — ABNORMAL LOW (ref >90)
Glucose, Bld: 168 mg/dL — ABNORMAL HIGH (ref 70–99)
Potassium: 4.6 mEq/L (ref 3.7–5.3)
Sodium: 143 mEq/L (ref 137–147)

## 2013-04-06 LAB — CBC
HEMATOCRIT: 24.7 % — AB (ref 36.0–46.0)
HEMOGLOBIN: 7.7 g/dL — AB (ref 12.0–15.0)
MCH: 25.4 pg — ABNORMAL LOW (ref 26.0–34.0)
MCHC: 31.2 g/dL (ref 30.0–36.0)
MCV: 81.5 fL (ref 78.0–100.0)
Platelets: 624 10*3/uL — ABNORMAL HIGH (ref 150–400)
RBC: 3.03 MIL/uL — AB (ref 3.87–5.11)
RDW: 17.7 % — ABNORMAL HIGH (ref 11.5–15.5)
WBC: 11.2 10*3/uL — AB (ref 4.0–10.5)

## 2013-04-06 LAB — MRSA PCR SCREENING: MRSA by PCR: POSITIVE — AB

## 2013-04-06 LAB — URINE MICROSCOPIC-ADD ON

## 2013-04-06 LAB — PROTIME-INR
INR: 1.23 (ref 0.00–1.49)
Prothrombin Time: 15.2 seconds (ref 11.6–15.2)

## 2013-04-06 MED ORDER — INSULIN ASPART 100 UNIT/ML ~~LOC~~ SOLN
0.0000 [IU] | Freq: Three times a day (TID) | SUBCUTANEOUS | Status: DC
  Administered 2013-04-06 – 2013-04-07 (×5): 2 [IU] via SUBCUTANEOUS
  Administered 2013-04-08 (×2): 1 [IU] via SUBCUTANEOUS
  Administered 2013-04-08 – 2013-04-09 (×2): 3 [IU] via SUBCUTANEOUS
  Administered 2013-04-09: 1 [IU] via SUBCUTANEOUS
  Administered 2013-04-10: 3 [IU] via SUBCUTANEOUS

## 2013-04-06 MED ORDER — SIMVASTATIN 20 MG PO TABS
20.0000 mg | ORAL_TABLET | Freq: Every day | ORAL | Status: DC
  Administered 2013-04-06 – 2013-04-10 (×5): 20 mg via ORAL
  Filled 2013-04-06 (×5): qty 1

## 2013-04-06 MED ORDER — WARFARIN VIDEO
1.0000 | Freq: Once | Status: AC
  Administered 2013-04-07: 1

## 2013-04-06 MED ORDER — COUMADIN BOOK
1.0000 | Freq: Once | Status: AC
  Administered 2013-04-06: 1
  Filled 2013-04-06: qty 1

## 2013-04-06 MED ORDER — CHLORHEXIDINE GLUCONATE CLOTH 2 % EX PADS
6.0000 | MEDICATED_PAD | Freq: Every day | CUTANEOUS | Status: DC
  Administered 2013-04-07 – 2013-04-09 (×4): 6 via TOPICAL

## 2013-04-06 MED ORDER — MUPIROCIN 2 % EX OINT
1.0000 "application " | TOPICAL_OINTMENT | Freq: Two times a day (BID) | CUTANEOUS | Status: DC
  Administered 2013-04-06 – 2013-04-10 (×9): 1 via NASAL
  Filled 2013-04-06: qty 22

## 2013-04-06 MED ORDER — SODIUM CHLORIDE 0.9 % IJ SOLN
3.0000 mL | Freq: Two times a day (BID) | INTRAMUSCULAR | Status: DC
  Administered 2013-04-06 – 2013-04-10 (×5): 3 mL via INTRAVENOUS

## 2013-04-06 MED ORDER — DOCUSATE SODIUM 100 MG PO CAPS: 100.0000 mg | ORAL_CAPSULE | Freq: Every day | ORAL | Status: DC | PRN

## 2013-04-06 MED ORDER — CARVEDILOL 25 MG PO TABS
25.0000 mg | ORAL_TABLET | Freq: Two times a day (BID) | ORAL | Status: DC
  Administered 2013-04-06 – 2013-04-10 (×9): 25 mg via ORAL
  Filled 2013-04-06 (×12): qty 1

## 2013-04-06 MED ORDER — HEPARIN BOLUS VIA INFUSION
2500.0000 [IU] | Freq: Once | INTRAVENOUS | Status: AC
  Administered 2013-04-06: 2500 [IU] via INTRAVENOUS
  Filled 2013-04-06: qty 2500

## 2013-04-06 MED ORDER — ACETAMINOPHEN 650 MG RE SUPP: 650.0000 mg | Freq: Four times a day (QID) | RECTAL | Status: DC | PRN

## 2013-04-06 MED ORDER — POLYSACCHARIDE IRON COMPLEX 150 MG PO CAPS
150.0000 mg | ORAL_CAPSULE | Freq: Every day | ORAL | Status: DC
  Administered 2013-04-06 – 2013-04-10 (×5): 150 mg via ORAL
  Filled 2013-04-06 (×5): qty 1

## 2013-04-06 MED ORDER — PANTOPRAZOLE SODIUM 20 MG PO TBEC
20.0000 mg | DELAYED_RELEASE_TABLET | Freq: Every day | ORAL | Status: DC
  Administered 2013-04-06 – 2013-04-10 (×5): 20 mg via ORAL
  Filled 2013-04-06 (×5): qty 1

## 2013-04-06 MED ORDER — ALBUTEROL SULFATE (2.5 MG/3ML) 0.083% IN NEBU: 3.0000 mL | INHALATION_SOLUTION | RESPIRATORY_TRACT | Status: DC | PRN

## 2013-04-06 MED ORDER — FUROSEMIDE 20 MG PO TABS
20.0000 mg | ORAL_TABLET | Freq: Every day | ORAL | Status: DC
  Administered 2013-04-06 – 2013-04-10 (×5): 20 mg via ORAL
  Filled 2013-04-06 (×5): qty 1

## 2013-04-06 MED ORDER — IOHEXOL 350 MG/ML SOLN
100.0000 mL | Freq: Once | INTRAVENOUS | Status: AC | PRN
  Administered 2013-04-06: 100 mL via INTRAVENOUS

## 2013-04-06 MED ORDER — LEVETIRACETAM 500 MG PO TABS
500.0000 mg | ORAL_TABLET | Freq: Two times a day (BID) | ORAL | Status: DC
  Administered 2013-04-06 – 2013-04-10 (×9): 500 mg via ORAL
  Filled 2013-04-06 (×10): qty 1

## 2013-04-06 MED ORDER — ASPIRIN 81 MG PO TABS: 81.0000 mg | ORAL_TABLET | Freq: Every day | ORAL | Status: DC

## 2013-04-06 MED ORDER — INSULIN GLARGINE 100 UNIT/ML SOLOSTAR PEN: 10.0000 [IU] | PEN_INJECTOR | Freq: Every day | SUBCUTANEOUS | Status: DC

## 2013-04-06 MED ORDER — INSULIN GLARGINE 100 UNIT/ML ~~LOC~~ SOLN
10.0000 [IU] | Freq: Every day | SUBCUTANEOUS | Status: DC
  Administered 2013-04-06 – 2013-04-09 (×4): 10 [IU] via SUBCUTANEOUS
  Filled 2013-04-06 (×5): qty 0.1

## 2013-04-06 MED ORDER — HEPARIN (PORCINE) IN NACL 100-0.45 UNIT/ML-% IJ SOLN
1500.0000 [IU]/h | INTRAMUSCULAR | Status: DC
  Administered 2013-04-06: 1300 [IU]/h via INTRAVENOUS
  Administered 2013-04-06: 1000 [IU]/h via INTRAVENOUS
  Administered 2013-04-06: 1300 [IU]/h via INTRAVENOUS
  Administered 2013-04-07 – 2013-04-10 (×4): 1600 [IU]/h via INTRAVENOUS
  Filled 2013-04-06 (×10): qty 250

## 2013-04-06 MED ORDER — WARFARIN - PHARMACIST DOSING INPATIENT
Freq: Every day | Status: DC
  Administered 2013-04-07 – 2013-04-08 (×2)

## 2013-04-06 MED ORDER — WARFARIN SODIUM 2.5 MG PO TABS
2.5000 mg | ORAL_TABLET | Freq: Once | ORAL | Status: AC
  Administered 2013-04-06: 2.5 mg via ORAL
  Filled 2013-04-06: qty 1

## 2013-04-06 MED ORDER — ASPIRIN EC 81 MG PO TBEC
81.0000 mg | DELAYED_RELEASE_TABLET | Freq: Every day | ORAL | Status: DC
  Administered 2013-04-06 – 2013-04-10 (×5): 81 mg via ORAL
  Filled 2013-04-06 (×5): qty 1

## 2013-04-06 MED ORDER — ACETAMINOPHEN 325 MG PO TABS
650.0000 mg | ORAL_TABLET | Freq: Four times a day (QID) | ORAL | Status: DC | PRN
  Administered 2013-04-07: 650 mg via ORAL
  Filled 2013-04-06: qty 2

## 2013-04-06 NOTE — H&P (Signed)
  Date: 04/06/2013  Patient name: [name]  Medical record number: [id number]  Date of birth: [birth year]   I have seen and evaluated [name] and discussed their care with the Residency Team.   Assessment and Plan: I have seen and evaluated the patient as outlined above. I agree with the formulated Assessment and Plan as detailed in the residents' admission note, with the following changes:   1. Will continue with anticoagulation and transition to coumadin.    Thayer Headings, MD 2/19/20159:02 AM

## 2013-04-06 NOTE — Progress Notes (Signed)
Utilization review completed.  

## 2013-04-06 NOTE — Progress Notes (Signed)
ANTICOAGULATION CONSULT NOTE - Initial Consult  Pharmacy Consult for heparin Indication: pulmonary embolus  No Known Allergies  Patient Measurements: Height: 5' 4.17" (163 cm) Weight: 191 lb 9.3 oz (86.9 kg) IBW/kg (Calculated) : 55.1 Heparin Dosing Weight: 75kg  Vital Signs: Temp: 101.6 F (38.7 C) (02/18 2212) Temp src: Rectal (02/18 2212) BP: 136/98 mmHg (02/19 0115) Pulse Rate: 86 (02/19 0115)  Labs:  Recent Labs  04/04/13 0535 04/04/13 1405 04/05/13 0645 04/05/13 2254  HGB 7.6* 7.6* 7.5* 8.7*  HCT 24.2* 24.4* 23.8* 27.7*  PLT 590* 585* 568* 731*  CREATININE 0.89  --   --  0.92    Estimated Creatinine Clearance: 54.8 ml/min (by C-G formula based on Cr of 0.92).   Medical History: Past Medical History  Diagnosis Date  . Diabetes mellitus 2007    HgA1C (02/20/2010) = 9.2, HgA1C (03/20/2009) = 12.1  . CVA (cerebrovascular accident) 3419     right embolic stroke, no residual deficits  . Diverticulitis   . CVA (cerebral infarction) 7-yrs ago  . Arthritis   . Dysphagia   . CHF (congestive heart failure)   . DIABETES MELLITUS, TYPE II 12/04/2005  . HYPERLIPIDEMIA 12/04/2005  . HYPERTENSION 12/04/2005  . GERD 12/04/2005  . ARTHRITIS, KNEE 03/24/2006  . Lung mass 07/08/2010  . Meningioma 07/21/2010  . Hemangioma of liver 12/02/2010  . Depression 12/02/2010  . Adenocarcinoma of lung      Right upper lobe adenocarcinoma. s/p right lower lobectomy 12/15/10    Medications:  Prescriptions prior to admission  Medication Sig Dispense Refill  . albuterol (PROVENTIL HFA;VENTOLIN HFA) 108 (90 BASE) MCG/ACT inhaler Inhale 2 puffs into the lungs every 4 (four) hours as needed for wheezing.  3 Inhaler  1  . albuterol (PROVENTIL) (2.5 MG/3ML) 0.083% nebulizer solution Take 3 mLs (2.5 mg total) by nebulization every 6 (six) hours as needed for wheezing.  75 mL  12  . amLODipine (NORVASC) 10 MG tablet Take 1 tablet (10 mg total) by mouth daily.  30 tablet  3  . aspirin 81 MG  tablet Take 81 mg by mouth daily.       . Blood Glucose Monitoring Suppl (ACCU-CHEK AVIVA PLUS) W/DEVICE KIT 1 each by Does not apply route 3 (three) times daily. dx code 250.00 insulin requiring  1 kit  1  . carvedilol (COREG) 25 MG tablet Take 1 tablet (25 mg total) by mouth 2 (two) times daily with a meal.  180 tablet  4  . [facility] LANCETS MICRO THIN 33G MISC Check blood sugar 3 times a day before meals dx code 250.00 insulin requiring  100 each  12  . docusate sodium (COLACE) 100 MG capsule Take 1 capsule (100 mg total) by mouth daily as needed.  30 capsule  1  . furosemide (LASIX) 20 MG tablet Take 1 tablet (20 mg total) by mouth daily.  90 tablet  0  . glucose blood (ACCU-CHEK AVIVA PLUS) test strip Check blood sugar 3 times a day before meals dx code 250.00 insulin requiring  100 each  12  . insulin aspart (NOVOLOG FLEXPEN) 100 UNIT/ML FlexPen Take three times with meals  <70    2 units if going to eat- skip if going to skip meal   71-140    4 units Novolog   141-210  6 units Novolog   211-280  8 units  15 mL  11  . Insulin Glargine (LANTUS SOLOSTAR) 100 UNIT/ML Solostar Pen Inject 25 Units into the skin  at bedtime.  10 mL  5  . iron polysaccharides (NIFEREX) 150 MG capsule Take 1 capsule (150 mg total) by mouth daily.  30 capsule  6  . Lancet Devices (ACCU-CHEK SOFTCLIX) lancets Check blood sugar 3 times a day before meals dx code 250.00 insulin requiring  100 each  12  . levETIRAcetam (KEPPRA) 500 MG tablet Take 1 tablet (500 mg total) by mouth 2 (two) times daily.  60 tablet  11  . pantoprazole (PROTONIX) 20 MG tablet Take 20 mg by mouth daily.      . simvastatin (ZOCOR) 20 MG tablet Take 20 mg by mouth daily.      Marland Kitchen triamcinolone ointment (KENALOG) 0.1 % Apply 1 application topically daily as needed (for skin irritaion).       Scheduled:  . piperacillin-tazobactam (ZOSYN)  IV  3.375 g Intravenous Q8H  . vancomycin  750 mg Intravenous Q12H    Assessment: 80yo female was discharged  2/18 and returns for SOB and lethargy, admitting MD concerned for PE given risks, CT reveals PE, to begin IV heparin.  Noted anemia and chronic hematuria.  Pt had brain surgery 60moago for meningioma.  Goal of Therapy:  Heparin level 0.3-0.7 units/ml Monitor platelets by anticoagulation protocol: Yes   Plan:  Will begin heparin gtt at 1000 units/hr and monitor heparin levels and CBC.  VWynona Neat PharmD, BCPS  04/06/2013,2:36 AM

## 2013-04-06 NOTE — Progress Notes (Signed)
Pharmacy Note-Anticoagulation  Pharmacy Consult :  80 y.o. female is currently on Coumadin with Heparin bridging for PE.   Latest Labs : Hematology :  Recent Labs  04/04/13 0535 04/04/13 1405 04/05/13 0645 04/05/13 2254 04/06/13 0630 04/06/13 1245  HGB 7.6* 7.6* 7.5* 8.7*  --  7.7*  HCT 24.2* 24.4* 23.8* 27.7*  --  24.7*  PLT 590* 585* 568* 731*  --  624*  LABPROT  --   --   --   --   --  15.2  INR  --   --   --   --   --  1.23  HEPARINUNFRC  --   --   --   --   --  0.10*  CREATININE 0.89  --   --  0.92 1.09  --     Lab Results  Component Value Date   INR 1.23 04/06/2013   INR 1.09 03/02/2013   INR 1.06 05/06/2012        HEPARINUNFRC 0.10* 04/06/2013        HGB 7.7* 04/06/2013   HGB 8.7* 04/05/2013   HGB 7.5* 04/05/2013    Current Medication[s] Include: Home Meds PTA: Prescriptions prior to admission  Medication Sig Dispense Refill  . albuterol (PROVENTIL HFA;VENTOLIN HFA) 108 (90 BASE) MCG/ACT inhaler Inhale 2 puffs into the lungs every 4 (four) hours as needed for wheezing.  3 Inhaler  1  . albuterol (PROVENTIL) (2.5 MG/3ML) 0.083% nebulizer solution Take 3 mLs (2.5 mg total) by nebulization every 6 (six) hours as needed for wheezing.  75 mL  12  . amLODipine (NORVASC) 10 MG tablet Take 1 tablet (10 mg total) by mouth daily.  30 tablet  3  . aspirin 81 MG tablet Take 81 mg by mouth daily.       . Blood Glucose Monitoring Suppl (ACCU-CHEK AVIVA PLUS) W/DEVICE KIT 1 each by Does not apply route 3 (three) times daily. dx code 250.00 insulin requiring  1 kit  1  . carvedilol (COREG) 25 MG tablet Take 1 tablet (25 mg total) by mouth 2 (two) times daily with a meal.  180 tablet  4  . [employer] LANCETS MICRO THIN 33G MISC Check blood sugar 3 times a day before meals dx code 250.00 insulin requiring  100 each  12  . docusate sodium (COLACE) 100 MG capsule Take 1 capsule (100 mg total) by mouth daily as needed.  30 capsule  1  . furosemide (LASIX) 20 MG tablet Take 1 tablet (20 mg total)  by mouth daily.  90 tablet  0  . glucose blood (ACCU-CHEK AVIVA PLUS) test strip Check blood sugar 3 times a day before meals dx code 250.00 insulin requiring  100 each  12  . insulin aspart (NOVOLOG FLEXPEN) 100 UNIT/ML FlexPen Take three times with meals  <70    2 units if going to eat- skip if going to skip meal   71-140    4 units Novolog   141-210  6 units Novolog   211-280  8 units  15 mL  11  . Insulin Glargine (LANTUS SOLOSTAR) 100 UNIT/ML Solostar Pen Inject 25 Units into the skin at bedtime.  10 mL  5  . iron polysaccharides (NIFEREX) 150 MG capsule Take 1 capsule (150 mg total) by mouth daily.  30 capsule  6  . Lancet Devices (ACCU-CHEK SOFTCLIX) lancets Check blood sugar 3 times a day before meals dx code 250.00 insulin requiring  100 each  12  .  levETIRAcetam (KEPPRA) 500 MG tablet Take 1 tablet (500 mg total) by mouth 2 (two) times daily.  60 tablet  11  . pantoprazole (PROTONIX) 20 MG tablet Take 20 mg by mouth daily.      . simvastatin (ZOCOR) 20 MG tablet Take 20 mg by mouth daily.      Marland Kitchen triamcinolone ointment (KENALOG) 0.1 % Apply 1 application topically daily as needed (for skin irritaion).       Scheduled:  Scheduled:  . aspirin EC  81 mg Oral Daily  . carvedilol  25 mg Oral BID WC  . Chlorhexidine Gluconate Cloth  6 each Topical QHS  . furosemide  20 mg Oral Daily  . insulin aspart  0-9 Units Subcutaneous TID WC  . insulin glargine  10 Units Subcutaneous QHS  . iron polysaccharides  150 mg Oral Daily  . levETIRAcetam  500 mg Oral BID  . mupirocin ointment  1 application Nasal BID  . pantoprazole  20 mg Oral Daily  . simvastatin  20 mg Oral Daily  . sodium chloride  3 mL Intravenous Q12H   Infusion[s]: Infusions:  . heparin 1,000 Units/hr (04/06/13 1200)    Assessment :  Baseline INR is 1.23, patient not on any anticoagulation prior toadmission.     Heparin level is 0.10 unit/ml.  This is SUB-therapeutic.  Hgb is stable with no bleeding complications  observed.  Coumadin Predictor Score = 3.  Goal :  INR goal is 2-3    Heparin goal is Heparin level 0.3-0.7 units/ml.  Plan : 1. Heparin bolus 2500 units IV now 2. Increase Heparin infusion to 1300 units/hr.   Next Heparin level due in 8 hours. 3. Coumadin 2.5 mg today. 4. Daily Heparin level, INR, CBC, and Monitor for bleeding complications. 5. Begin Coumadin education.  Jamiee Milholland, Craig Guess, Pharm.D. 04/06/2013  1:57 PM

## 2013-04-06 NOTE — H&P (Signed)
Date: 04/06/2013               Patient Name:  [name] MRN: [id number]  DOB: 10/31/1933 Age / Sex: 80 y.o., female   PCP: Jerene Pitch, MD         Medical Service: Internal Medicine Teaching Service         Attending Physician: Dr. Thayer Headings, MD    First Contact: Dr. Michail Jewels Pager: [phone]  Second Contact: Dr. Jessee Avers Pager: [phone]       After Hours (After 5p/  First Contact Pager: [phone]  weekends / holidays): Second Contact Pager: [phone]   Chief Complaint: shortness of breath  History of Present Illness:  [name] is a 80 year-old woman with past medical history of CHF (EF 40%), insulin-dependent Type II DM, meningioma  resection 1/15, recently discharged 2/18, presenting with hypoxia and AMS. Per daughter, on the afternoon of admission, the patient walked up 3 steps to her apartment, and became significantly short of breath, having to sit to catch her breath.  For the remainder of the day, the patient's family notes observing "heavy breathing" and fatigue, with a decreased level of interaction with the family; an abrupt change from earlier that day.  EMS was called, and found the patient to be hypoxic to the 70's and somnolent, reportedly with immediate improvement in mental status with oxygen. On evaluation in the ED, the patient is able to respond to commands, and able to give simple 1-word answers, but unable to recount the day's events or hold a conversation. The patient notes no chest pain, cough, or lightheadedness.    Of note, the patient was recently hospitalized 2/16-2/18 with AMS, thought to be due to medication side effect vs ?UTI, and was discharged on the morning of admission (2/18) with mental status at baseline per patient's family.  The patient's O2 sat was 92% on admission 2/16 on room air, and the patient was intermittently on O2 by [place] during that hospitalization, though interestingly no desaturations were noted during that  hospitalization (unclear why patient was placed on O2 at that time).  Meds: Current Facility-Administered Medications  Medication Dose Route Frequency Provider Last Rate Last Dose  . acetaminophen (TYLENOL) suppository 650 mg  650 mg Rectal Q4H PRN Houston Siren III, MD      . piperacillin-tazobactam (ZOSYN) IVPB 3.375 g  3.375 g Intravenous Q8H Rande Lawman [place], [facility]      . vancomycin (VANCOCIN) IVPB 750 mg/150 ml premix  750 mg Intravenous Q12H Rande Lawman [place], [facility]       Current Outpatient Prescriptions  Medication Sig Dispense Refill  . albuterol (PROVENTIL HFA;VENTOLIN HFA) 108 (90 BASE) MCG/ACT inhaler Inhale 2 puffs into the lungs every 4 (four) hours as needed for wheezing.  3 Inhaler  1  . albuterol (PROVENTIL) (2.5 MG/3ML) 0.083% nebulizer solution Take 3 mLs (2.5 mg total) by nebulization every 6 (six) hours as needed for wheezing.  75 mL  12  . amLODipine (NORVASC) 10 MG tablet Take 1 tablet (10 mg total) by mouth daily.  30 tablet  3  . aspirin 81 MG tablet Take 81 mg by mouth daily.       . Blood Glucose Monitoring Suppl (ACCU-CHEK AVIVA PLUS) W/DEVICE KIT 1 each by Does not apply route 3 (three) times daily. dx code 250.00 insulin requiring  1 kit  1  . carvedilol (COREG) 25 MG tablet Take 1 tablet (25 mg total) by mouth 2 (two)  times daily with a meal.  180 tablet  4  . [employer] LANCETS MICRO THIN 33G MISC Check blood sugar 3 times a day before meals dx code 250.00 insulin requiring  100 each  12  . docusate sodium (COLACE) 100 MG capsule Take 1 capsule (100 mg total) by mouth daily as needed.  30 capsule  1  . furosemide (LASIX) 20 MG tablet Take 1 tablet (20 mg total) by mouth daily.  90 tablet  0  . glucose blood (ACCU-CHEK AVIVA PLUS) test strip Check blood sugar 3 times a day before meals dx code 250.00 insulin requiring  100 each  12  . insulin aspart (NOVOLOG FLEXPEN) 100 UNIT/ML FlexPen Take three times with meals  <70    2 units if going to eat- skip if going  to skip meal   71-140    4 units Novolog   141-210  6 units Novolog   211-280  8 units  15 mL  11  . Insulin Glargine (LANTUS SOLOSTAR) 100 UNIT/ML Solostar Pen Inject 25 Units into the skin at bedtime.  10 mL  5  . iron polysaccharides (NIFEREX) 150 MG capsule Take 1 capsule (150 mg total) by mouth daily.  30 capsule  6  . Lancet Devices (ACCU-CHEK SOFTCLIX) lancets Check blood sugar 3 times a day before meals dx code 250.00 insulin requiring  100 each  12  . levETIRAcetam (KEPPRA) 500 MG tablet Take 1 tablet (500 mg total) by mouth 2 (two) times daily.  60 tablet  11  . pantoprazole (PROTONIX) 20 MG tablet Take 20 mg by mouth daily.      . simvastatin (ZOCOR) 20 MG tablet Take 20 mg by mouth daily.      Marland Kitchen triamcinolone ointment (KENALOG) 0.1 % Apply 1 application topically daily as needed (for skin irritaion).        Allergies: Allergies as of 04/05/2013  . (No Known Allergies)   Past Medical History  Diagnosis Date  . Diabetes mellitus 2007    HgA1C (02/20/2010) = 9.2, HgA1C (03/20/2009) = 12.1  . CVA (cerebrovascular accident) 4259     right embolic stroke, no residual deficits  . Diverticulitis   . CVA (cerebral infarction) 7-yrs ago  . Arthritis   . Dysphagia   . CHF (congestive heart failure)   . DIABETES MELLITUS, TYPE II 12/04/2005  . HYPERLIPIDEMIA 12/04/2005  . HYPERTENSION 12/04/2005  . GERD 12/04/2005  . ARTHRITIS, KNEE 03/24/2006  . Lung mass 07/08/2010  . Meningioma 07/21/2010  . Hemangioma of liver 12/02/2010  . Depression 12/02/2010  . Adenocarcinoma of lung      Right upper lobe adenocarcinoma. s/p right lower lobectomy 12/15/10   Past Surgical History  Procedure Laterality Date  . Abdominal hysterectomy    . Video bronchoscope.  12/29/2007    [place]  . Wide excision of left upper back mass.    . Extracapsular cataract extraction with intraocular      lens implantation.  . Right vats,right thoracotomy,right lower lobectomy with node dissection    .  Incision and drainage perirectal abscess N/A 01/19/2013    Procedure: IRRIGATION AND DEBRIDEMENT PERIRECTAL ABSCESS;  Surgeon: Zenovia Jarred, MD;  Location: [place];  Service: General;  Laterality: N/A;  . Craniotomy N/A 03/02/2013    Procedure: CRANIOTOMY TUMOR EXCISION;  Surgeon: Winfield Cunas, MD;  Location: [place] [facility];  Service: Neurosurgery;  Laterality: N/A;  Bifrontal Craniotomy for tumor   Family History  Problem Relation Age of  Onset  . Hyperlipidemia Brother   . Hypertension Brother   . Diabetes Brother    History   Social History  . Marital Status: Widowed    Spouse Name: N/A    Number of Children: N/A  . Years of Education: N/A   Occupational History  . Not on file.   Social History Main Topics  . Smoking status: Former Smoker    Types: Cigarettes    Quit date: 02/17/2000  . Smokeless tobacco: Never Used  . Alcohol Use: No  . Drug Use: No  . Sexual Activity: Not Currently   Other Topics Concern  . Not on file   Social History Narrative   Patient requests to use [employer] [facility] [facility] for her diabets testing supplies as of 09/05/2009.    Review of Systems: Unable to obtain due to AMS.  Physical Exam: Blood pressure 140/69, pulse 88, temperature 101.6 F (38.7 C), temperature source Rectal, resp. rate 24, SpO2 100.00%. General: lying in bed, appears confused HEENT: left pupil reactive to light, right pupil s/p surgery, EOMI, oropharynx non-erythematous Neck: supple Lungs: limited by patient participation, mildly tachypneic, no wheezes, rales, or ronchi appreciated Heart: distant heart sounds, regular rate and rhythm, no murmurs, gallops, or rubs Abdomen: soft, mild suprapubic tenderness to palpation, non-distended, normal bowel sounds Extremities: bilateral trace pitting edema, right posterior calf with tenderness to palpation, without overlying skin changes Neurologic: A&O to person and place but not time, does not spontaneously participate in  conversation, follows commands, moves all 4 extremities equally, strength 4/5 throughout, sensation grossly intact (limited by mental status)  Lab results: Basic Metabolic Panel:  Recent Labs  04/03/13 0057 04/04/13 0535 04/05/13 2254  NA 143 146 142  K 3.5* 4.4 4.2  CL 104 110 103  CO2 _0 GLUCOSE 115* 131* 202*  BUN _1 CREATININE 0.84 0.89 0.92  CALCIUM 8.4 8.5 8.6  MG 1.8  --   --   PHOS 2.5  --   --    Liver Function Tests:  Recent Labs  04/03/13 0057 04/05/13 2254  AST 11 18  ALT 10 11  ALKPHOS 79 106  BILITOT <0.2* 0.3  PROT 6.3 7.0  ALBUMIN 2.5* 2.7*   CBC:  Recent Labs  04/05/13 0645 04/05/13 2254  WBC 10.8* 12.2*  NEUTROABS 7.3 9.5*  HGB 7.5* 8.7*  HCT 23.8* 27.7*  MCV 81.2 80.5  PLT 568* 731*   Cardiac Enzymes:  Recent Labs  04/03/13 0057  TROPONINI <0.30   BNP:  Recent Labs  04/05/13 2254  PROBNP 3561.0*   CBG:  Recent Labs  04/04/13 0643 04/04/13 1128 04/04/13 1717 04/04/13 2110 04/05/13 0657 04/05/13 1116  GLUCAP 131* 139* 152* 131* 140* 128*   Anemia Panel:  Recent Labs  04/04/13 1716  VITAMINB12 466  FOLATE 15.2  FERRITIN 208  TIBC NOT CALC  IRON <10*  RETICCTPCT 1.9   Urinalysis:  Recent Labs  04/03/13 0238  COLORURINE YELLOW  LABSPEC 1.026  PHURINE 5.0  GLUCOSEU NEGATIVE  HGBUR MODERATE*  BILIRUBINUR NEGATIVE  KETONESUR NEGATIVE  PROTEINUR 100*  UROBILINOGEN 0.2  NITRITE NEGATIVE  LEUKOCYTESUR LARGE*    Imaging results:  Dg Chest Port 1 View  (if Code Sepsis Called)  04/05/2013   CLINICAL DATA:  Sepsis.  Altered mental status.  EXAM: PORTABLE CHEST - 1 VIEW  COMPARISON:  DG CHEST 2 VIEW dated 04/03/2013; DG CHEST 1V PORT dated 02/25/2013; CT CHEST W/CM dated 05/07/2012  FINDINGS: Heart  is borderline enlarged. Mild vascular congestion. Vague opacity throughout much of the left lung. Cannot exclude infiltrate/pneumonia. Patchy right perihilar opacity also noted. No effusions. No acute  bony abnormality.  IMPRESSION: Vague diffuse opacities throughout the left lung and in the right perihilar region. Cannot exclude infection. Asymmetric edema possible blood fell less likely.  Mild cardiomegaly.   Electronically Signed   By: Rolm Baptise M.D.   On: 04/05/2013 23:09    Other results: EKG: NSR, LVH, unchanged from prior  Assessment & Plan by Problem: The patient is a 80 yo woman, history of meningiomas s/p resection 1/15, recently discharged 2/18 after an admission for acute encephalopathy, presenting later on 2/18 with hypoxia and altered mental status and found to have acute PE.  Acute Moderate Middle and Lower Lobe Pulmonary Embolus complicated by mild right heart strain- The patient presented with acute onset of hypoxia (PaO2 41 and SpO2 77%) requiring supplemental oxygen (5L), mild tachycardia (102), and fever (101.6) likely triggered by DVT (+ Homan's signs) in setting of recent surgery, hospitalization (10 days in January and recently for 2 days), and immobilization. CTA chest revealed moderate middle lobe and lower lobe pulmonary emboli. Also with component of mild right heart strain with a RV to LV ratio of 1.06. In the ED, concern was raised for HCAP vs acute CHF and pt received IV vancomycin and zosyn for HCAP. Anticoagulation therapy was discussed with her neurosurgeon Dr. Christella Noa given recent meningioma resection on 03/02/13 who agreed with heparin and coumadin administration for acute PE. -Oxygen therapy to keep SpO2 > 92% -Administer IV heparin with bridge to coumadin per pharmacy -D/c vanc/zosyn as clinical picture not c/w HCAP -Monitor for bleeding   Acute encephalopathy -  Pt with acute onset AMS most likely due to acute hypoxic respiratory failure in setting of acute PE. Also with fever on admission of 101.6 most likely due to PE. Pt with recent AMS and hospitalization thought to be due to symptomatic UTI. Pt with mild suprapubic tenderness on exam. Pt with recent  meningioma resection compliant with anti-seizure medication with no reported seizure activity or focal neurological deficits on exam.  -Oxygen therapy to keep SpO2 > 92% -Treat acute PE with AC therapy  -Continue home keppra 500 mg BID -Awaiting blood cultures (obtained in ED) -Awaiting urine culture -Hold narcotics  -Continue to monitor mental status     Symptomatic Complicated UTI - Pt with recent hospitalization for symptomatic UTI and history of UTIs (treated with Cipro). UA on admission with large LE, pyuria (too numerous), bacteriuria, leukocytosis with neutrophilia (12.2), candiduria, and mild suprapubic tenderness to palpation.  The patient received 2 doses of ceftriaxone 2/16-2/17.  Urine culture on 2/16 was negative (unclear if obtained before or s/p antibiotics). Pt with history of uncontrolled DM.   -Awaiting urine culture -Consider IV ceftriaxone or bactrim , avoid ciprofloxacin in setting of AMS -No indication for treatment of asymptomatic candiduria  -Outpatient urology follow-up for persistent hematuria   Elevated AG acidosis - Pt with AG of 15 on admission, increased from 12 on the day prior to admission.  Etiology most likely transient lactic acidosis from hypoxemia as lactic acid on admission was normal. -Monitor BMP   Insulin-dependent Type II DM - Pt with last A1C of 9.7 on 03/28/13.  Pt at home on 25 U lantus and  insulin sliding scale.   -Lantus 10 U qhs (home dose 25 U) -Sensitive Insulin sliding scale -Monitor glucose with meals and at bedtime  Systolic CHF -  Myoview 2012 revealed EF 40%. CTA chest with ground-glass opacities throughout the left lung and to a lesser extent right lower lobe that may reflect edema. Pro-BNP on admission elevated at 3561 (648 on 11/03/11).   Lasix held for 2 days of recent 3 day hospitalization. Pt appear euvolemic on exam.  -Continue home furosemide 20 mg daily -Hold home carvedilol 25 mg BID -Consider 2D-Echo -Consider restarting  previously d/c ACEi  On 01/22/13 due to AKI   Hypertension - currently normotensive  -Hold home amlodipine 10 mg daily, consider resuming if hypertensive    -Continue carvedilol 25 mg BID -Continue home furosemide 20 mg daily   Right Atypical Meningioma Resection - s/p resection of 7cm cerebral right atypical meningioma Grade II tumor excision on 03/02/13 with no recent seizure activity. Pt at home on keppra daily. -Continue home Keppra 500 mg BID  -Continue to monitor for seizure activity  -Pt follows with neurosurgeon Dr. Christella Noa   Chronic Normocytic Anemia - Pt with Hg of 8.7 on admission without active bleeding or hemodynamic instability. Anemia panel on 2/17 revealed low iron <10 with normal ferritin however in setting of acute infection. TIBC and Iron sat were not calculated. Pt with history of mild antral gastritis in 2012 and  2004 colonoscopy with diverticulosis. Pt received IV iron on last hospitalization and discharged on oral iron therapy.  -Monitor in setting of AC therapy -Monitor for bleeding -Continue oral iron therapy  -Continue colace 100 mg BID   Thrombocytosis - Pt with history of since last admission 2/16-2/18. Presented with platelet count of 731K on admission, with 568 one day ago. Etiology unknown, most likely reactive to anemia (iron-deficiency?) -Monitor for hyperviscosity symptoms   History of CVA - Pt currently with no focal neurological deficits -Continue home aspirin 81 mg daily -Continue home simvastatin 20 mg daily   GERD - currently without reflux symptoms -Continue pantoprazole 20 mg daily   Diet: Carb modified  DVT Ppx: IV heparin  Code: Full   Dispo: Disposition is deferred at this time, awaiting improvement of current medical problems. Anticipated discharge in approximately 2-3 day(s).   The patient does have a current PCP Jerene Pitch, MD) and does need an [facility] hospital follow-up appointment after discharge.  Signed:  Juluis Mire,  MD 04/06/2013, 12:51 AM

## 2013-04-07 DIAGNOSIS — D509 Iron deficiency anemia, unspecified: Secondary | ICD-10-CM

## 2013-04-07 DIAGNOSIS — I5022 Chronic systolic (congestive) heart failure: Secondary | ICD-10-CM

## 2013-04-07 DIAGNOSIS — IMO0001 Reserved for inherently not codable concepts without codable children: Secondary | ICD-10-CM

## 2013-04-07 DIAGNOSIS — I2699 Other pulmonary embolism without acute cor pulmonale: Secondary | ICD-10-CM | POA: Diagnosis not present

## 2013-04-07 DIAGNOSIS — E1165 Type 2 diabetes mellitus with hyperglycemia: Secondary | ICD-10-CM

## 2013-04-07 DIAGNOSIS — I509 Heart failure, unspecified: Secondary | ICD-10-CM | POA: Diagnosis not present

## 2013-04-07 DIAGNOSIS — G40909 Epilepsy, unspecified, not intractable, without status epilepticus: Secondary | ICD-10-CM

## 2013-04-07 DIAGNOSIS — R319 Hematuria, unspecified: Secondary | ICD-10-CM

## 2013-04-07 LAB — CBC
HCT: 25.6 % — ABNORMAL LOW (ref 36.0–46.0)
Hemoglobin: 8 g/dL — ABNORMAL LOW (ref 12.0–15.0)
MCH: 25.4 pg — ABNORMAL LOW (ref 26.0–34.0)
MCHC: 31.3 g/dL (ref 30.0–36.0)
MCV: 81.3 fL (ref 78.0–100.0)
PLATELETS: 666 10*3/uL — AB (ref 150–400)
RBC: 3.15 MIL/uL — ABNORMAL LOW (ref 3.87–5.11)
RDW: 17.8 % — AB (ref 11.5–15.5)
WBC: 14.5 10*3/uL — AB (ref 4.0–10.5)

## 2013-04-07 LAB — HEPARIN LEVEL (UNFRACTIONATED)
Heparin Unfractionated: 0.45 IU/mL (ref 0.30–0.70)
Heparin Unfractionated: 0.3 IU/mL (ref 0.30–0.70)
Heparin Unfractionated: 0.67 IU/mL (ref 0.30–0.70)

## 2013-04-07 LAB — HIV ANTIBODY (ROUTINE TESTING W REFLEX): HIV: NONREACTIVE

## 2013-04-07 LAB — GLUCOSE, CAPILLARY
GLUCOSE-CAPILLARY: 111 mg/dL — AB (ref 70–99)
GLUCOSE-CAPILLARY: 154 mg/dL — AB (ref 70–99)
Glucose-Capillary: 173 mg/dL — ABNORMAL HIGH (ref 70–99)
Glucose-Capillary: 135 mg/dL — ABNORMAL HIGH (ref 70–99)

## 2013-04-07 LAB — PROTIME-INR
INR: 1.09 (ref 0.00–1.49)
Prothrombin Time: 13.9 seconds (ref 11.6–15.2)

## 2013-04-07 MED ORDER — WARFARIN SODIUM 5 MG PO TABS
5.0000 mg | ORAL_TABLET | Freq: Once | ORAL | Status: AC
  Administered 2013-04-07: 5 mg via ORAL
  Filled 2013-04-07: qty 1

## 2013-04-07 MED ORDER — HEPARIN BOLUS VIA INFUSION
2500.0000 [IU] | Freq: Once | INTRAVENOUS | Status: AC
  Administered 2013-04-07: 2500 [IU] via INTRAVENOUS
  Filled 2013-04-07: qty 2500

## 2013-04-07 MED ORDER — GLUCERNA SHAKE PO LIQD
237.0000 mL | Freq: Three times a day (TID) | ORAL | Status: DC
  Administered 2013-04-07 – 2013-04-10 (×7): 237 mL via ORAL

## 2013-04-07 NOTE — Progress Notes (Signed)
Chaplain responded to family request for support. Chaplain provided ministry of presence and answered questions about HCPOA.   04/06/13 1905  Clinical Encounter Type  Visited With Patient and family together  Visit Type Initial  Referral From [facility]

## 2013-04-07 NOTE — Progress Notes (Signed)
Pharmacy Note-Anticoagulation  Pharmacy Consult :  80 y.o. female is currently on Coumadin with Heparin bridging for pulmonary embolism.   Latest Labs : Hematology :  Recent Labs  04/06/13 1245 04/07/13 0112 04/07/13 0330 04/07/13 0941  HGB 7.7*  --  8.0*  --   HCT 24.7*  --  25.6*  --   PLT 624*  --  666*  --   LABPROT 15.2  --  13.9  --   INR 1.23  --  1.09  --   HEPARINUNFRC 0.10* 0.45  --  0.30    Lab Results  Component Value Date   INR 1.09 04/07/2013   INR 1.23 04/06/2013   INR 1.09 03/02/2013        HEPARINUNFRC 0.30 04/07/2013   HEPARINUNFRC 0.45 04/07/2013   HEPARINUNFRC 0.10* 04/06/2013        HGB 8.0* 04/07/2013   HGB 7.7* 04/06/2013   HGB 8.7* 04/05/2013    Current Medication[s] Include: Scheduled:  Scheduled:  . aspirin EC  81 mg Oral Daily  . carvedilol  25 mg Oral BID WC  . Chlorhexidine Gluconate Cloth  6 each Topical QHS  . feeding supplement (GLUCERNA SHAKE)  237 mL Oral TID BM  . furosemide  20 mg Oral Daily  . insulin aspart  0-9 Units Subcutaneous TID WC  . insulin glargine  10 Units Subcutaneous QHS  . iron polysaccharides  150 mg Oral Daily  . levETIRAcetam  500 mg Oral BID  . mupirocin ointment  1 application Nasal BID  . pantoprazole  20 mg Oral Daily  . simvastatin  20 mg Oral Daily  . sodium chloride  3 mL Intravenous Q12H  . Warfarin - Pharmacist Dosing Inpatient   Does not apply q1800   Infusion[s]: Infusions:  . heparin 1,600 Units/hr (04/07/13 1100)   Antibiotic[s]: Off All Antibiotics  Assessment :  Heparin level has is therapeutic but has trended up significantly from 0.3 to 0.67 (upper end of therapeutic range).  No bleeding complications observed.  Goal :  INR goal is 2-3    Heparin goal is Heparin level 0.3-0.7 units/ml.  Plan : 1. Decrease heparin infusion rate to 1550 units/hr  2. F/u HL in 8 hours  3. Daily Heparin level, INR, CBC, and Monitor for bleeding complications.  Albertina Parr, PharmD.  Clinical  Pharmacist Pager [phone]

## 2013-04-07 NOTE — Progress Notes (Signed)
Pharmacy Note-Anticoagulation  Pharmacy Consult :  80 y.o. female is currently on Coumadin with Heparin bridging for pulmonary embolism.   Latest Labs : Hematology :  Recent Labs  04/06/13 1245 04/07/13 0112 04/07/13 0330 04/07/13 0941  HGB 7.7*  --  8.0*  --   HCT 24.7*  --  25.6*  --   PLT 624*  --  666*  --   LABPROT 15.2  --  13.9  --   INR 1.23  --  1.09  --   HEPARINUNFRC 0.10* 0.45  --  0.30    Lab Results  Component Value Date   INR 1.09 04/07/2013   INR 1.23 04/06/2013   INR 1.09 03/02/2013        HEPARINUNFRC 0.30 04/07/2013   HEPARINUNFRC 0.45 04/07/2013   HEPARINUNFRC 0.10* 04/06/2013        HGB 8.0* 04/07/2013   HGB 7.7* 04/06/2013   HGB 8.7* 04/05/2013    Current Medication[s] Include: Scheduled:  Scheduled:  . aspirin EC  81 mg Oral Daily  . carvedilol  25 mg Oral BID WC  . Chlorhexidine Gluconate Cloth  6 each Topical QHS  . feeding supplement (GLUCERNA SHAKE)  237 mL Oral TID BM  . furosemide  20 mg Oral Daily  . insulin aspart  0-9 Units Subcutaneous TID WC  . insulin glargine  10 Units Subcutaneous QHS  . iron polysaccharides  150 mg Oral Daily  . levETIRAcetam  500 mg Oral BID  . mupirocin ointment  1 application Nasal BID  . pantoprazole  20 mg Oral Daily  . simvastatin  20 mg Oral Daily  . sodium chloride  3 mL Intravenous Q12H  . warfarin  1 each Does not apply Once  . Warfarin - Pharmacist Dosing Inpatient   Does not apply q1800   Infusion[s]: Infusions:  . heparin 1,300 Units/hr (04/07/13 1100)   Antibiotic[s]: Off All Antibiotics  Assessment :  Today's INR trending down despite receiving Coumadin.   INR is 1.07.    Heparin level is at lowest level of therapeutic range, 0.3 units/ml..  No bleeding complications observed.  Goal :  INR goal is 2-3    Heparin goal is Heparin level 0.3-0.7 units/ml.  Plan : 1. Heparin bolus 2500 units x 1.  Then will increase Heparin infusion to 1600 units/hr.   The next Heparin Level will be  due in 8 hours @ 2100 pm 2. Coumadin 5 mg today. 3. Daily Heparin level, INR, CBC, and Monitor for bleeding complications.  Yianni Skilling, Craig Guess, Pharm.D. 04/07/2013  12:23 PM

## 2013-04-07 NOTE — Progress Notes (Signed)
ANTICOAGULATION CONSULT NOTE - Follow Up Consult  Pharmacy Consult for Heparin  Indication: pulmonary embolus  No Known Allergies  Patient Measurements: Height: 5\' 4"  (162.6 cm) Weight: 188 lb 0.8 oz (85.3 kg) IBW/kg (Calculated) : 54.7  Vital Signs: Temp: 98.8 F (37.1 C) (02/19 1927) Temp src: Oral (02/19 1927) BP: 95/61 mmHg (02/19 1927) Pulse Rate: 73 (02/19 1927)  Labs:  Recent Labs  04/04/13 0535  04/05/13 0645 04/05/13 2254 04/06/13 0630 04/06/13 1245 04/07/13 0112  HGB 7.6*  < > 7.5* 8.7*  --  7.7*  --   HCT 24.2*  < > 23.8* 27.7*  --  24.7*  --   PLT 590*  < > 568* 731*  --  624*  --   LABPROT  --   --   --   --   --  15.2  --   INR  --   --   --   --   --  1.23  --   HEPARINUNFRC  --   --   --   --   --  0.10* 0.45  CREATININE 0.89  --   --  0.92 1.09  --   --   < > = values in this interval not displayed.  Estimated Creatinine Clearance: 45.6 ml/min (by C-G formula based on Cr of 1.09).   Medications:  Heparin 1300 units/hr  Assessment: 80 y/o F on heparin/warfarin bridge for PE, HL is 0.45, other labs as above.   Goal of Therapy:  Heparin level 0.3-0.7 units/ml Monitor platelets by anticoagulation protocol: Yes   Plan:  -Continue heparin at 1300 units/hr -1000 HL to confirm -Daily CBC/HL -Warfarin per previous note -Monitor for bleeding  Narda Bonds 04/07/2013,2:19 AM

## 2013-04-07 NOTE — Progress Notes (Signed)
  Date: 04/07/2013  Patient name: [name]  Medical record number: [id number]  Date of birth: [birth year]   This patient has been seen and the plan of care was discussed with the house staff. Please see their note for complete details. I concur with their findings.    Thayer Headings, MD 04/07/2013, 2:32 PM

## 2013-04-07 NOTE — Progress Notes (Signed)
Subjective:   Pt has no new complaints this AM.  She is alert and oriented X 3.   Objective:   Vital signs in last 24 hours: Filed Vitals:   04/07/13 0730 04/07/13 1225 04/07/13 1626 04/07/13 1936  BP: 129/52 124/59 119/63 118/55  Pulse: 76 75 76 71  Temp:  98.7 F (37.1 C) 98.5 F (36.9 C) 99 F (37.2 C)  TempSrc:  Oral Oral Oral  Resp: 23 16 24 20   Height:      Weight:      SpO2: 95% 95% 97% 96%   Weight change: -3 lb 15.5 oz (-1.8 kg)  Intake/Output Summary (Last 24 hours) at 04/07/13 2215 Last data filed at 04/07/13 1245  Gross per 24 hour  Intake    451 ml  Output      0 ml  Net    451 ml    Physical Exam: Constitutional: Vital signs reviewed.  Patient is in no acute distress and cooperative with exam.   Head: Normocephalic and atraumatic Eyes: EOMI, conjunctivae normal, no scleral icterus  Neck: Supple, Trachea midline Cardiovascular: RRR, no MRG, DP 2+ b/l Pulmonary/Chest: normal respiratory effort, CTAB, no wheezes, rales, or rhonchi Abdominal: Soft. +BS, Non-tender, non-distended, no suprapubic tenderness Neurological: A&O x3, cranial nerve II-XII are grossly intact, moving all extremities  Skin: Warm, dry and intact.  Psychiatric: Normal mood and affect.   Lab Results:  BMP:  Recent Labs Lab 04/03/13 0057  04/05/13 2254 04/06/13 0630  NA 143  < > 142 143  K 3.5*  < > 4.2 4.6  CL 104  < > 103 107  CO2 26  < > 24 21  GLUCOSE 115*  < > 202* 168*  BUN 16  < > 18 17  CREATININE 0.84  < > 0.92 1.09  CALCIUM 8.4  < > 8.6 8.2*  MG 1.8  --   --   --   PHOS 2.5  --   --   --   < > = values in this interval not displayed.  CBC:  Recent Labs Lab 04/05/13 0645 04/05/13 2254 04/06/13 1245 04/07/13 0330  WBC 10.8* 12.2* 11.2* 14.5*  NEUTROABS 7.3 9.5*  --   --   HGB 7.5* 8.7* 7.7* 8.0*  HCT 23.8* 27.7* 24.7* 25.6*  MCV 81.2 80.5 81.5 81.3  PLT 568* 731* 624* 666*    Coagulation:  Recent Labs Lab 04/06/13 1245 04/07/13 0330    LABPROT 15.2 13.9  INR 1.23 1.09    CBG:            Recent Labs Lab 04/06/13 1157 04/06/13 1640 04/06/13 2144 04/07/13 0734 04/07/13 1155 04/07/13 1607  GLUCAP 194* 157* 134* 111* 154* 173*           HA1C:      No results found for this basename: HGBA1C,  in the last 168 hours  Lipid Panel: No results found for this basename: CHOL, HDL, LDLCALC, TRIG, CHOLHDL, LDLDIRECT,  in the last 168 hours  LFTs:  Recent Labs Lab 04/03/13 0057 04/05/13 2254  AST 11 18  ALT 10 11  ALKPHOS 79 106  BILITOT <0.2* 0.3  PROT 6.3 7.0  ALBUMIN 2.5* 2.7*    Pancreatic Enzymes: No results found for this basename: LIPASE, AMYLASE,  in the last 168 hours  Ammonia: No results found for this basename: AMMONIA,  in the last 168 hours  Cardiac Enzymes:  Recent Labs Lab 04/03/13 [place] <0.30  Lab Results  Component Value Date   CKTOTAL 34 02/25/2013   CKMB 2.6 11/10/2010   TROPONINI <0.30 04/03/2013    EKG:  Date/Time:    Ventricular Rate:    PR Interval:    QRS Duration:   QT Interval:    QTC Calculation:   R Axis:     Text Interpretation:    BNP:  Recent Labs Lab 04/05/13 2254  PROBNP 3561.0*    D-Dimer: No results found for this basename: DDIMER,  in the last 168 hours  Urinalysis:  Recent Labs Lab 04/03/13 0238 04/06/13 0216  COLORURINE YELLOW YELLOW  LABSPEC 1.026 1.030  PHURINE 5.0 5.5  GLUCOSEU NEGATIVE NEGATIVE  HGBUR MODERATE* SMALL*  BILIRUBINUR NEGATIVE NEGATIVE  KETONESUR NEGATIVE 15*  PROTEINUR 100* 30*  UROBILINOGEN 0.2 0.2  NITRITE NEGATIVE NEGATIVE  LEUKOCYTESUR LARGE* LARGE*    Micro Results: Recent Results (from the past 240 hour(s))  URINE CULTURE     Status: None   Collection Time    04/03/13  2:38 AM      Result Value Ref Range Status   Specimen Description URINE, RANDOM   Final   Special Requests NONE   Final   Culture  Setup Time     Final   Value: 04/03/2013 03:07     Performed at [place]     Final   Value: NO GROWTH     Performed at [facility]   Culture     Final   Value: NO GROWTH     Performed at [facility]   Report Status 04/03/2013 FINAL   Final  CULTURE, BLOOD (ROUTINE X 2)     Status: None   Collection Time    04/05/13 10:45 PM      Result Value Ref Range Status   Specimen Description BLOOD LEFT ARM   Final   Special Requests BOTTLES DRAWN AEROBIC AND ANAEROBIC 10CC EACH   Final   Culture  Setup Time     Final   Value: 04/06/2013 04:24     Performed at [facility]   Culture     Final   Value:        BLOOD CULTURE RECEIVED NO GROWTH TO DATE CULTURE WILL BE HELD FOR 5 DAYS BEFORE ISSUING A FINAL NEGATIVE REPORT     Performed at [facility]   Report Status PENDING   Incomplete  CULTURE, BLOOD (ROUTINE X 2)     Status: None   Collection Time    04/05/13 10:55 PM      Result Value Ref Range Status   Specimen Description BLOOD RIGHT ARM   Final   Special Requests BOTTLES DRAWN AEROBIC ONLY 6CC   Final   Culture  Setup Time     Final   Value: 04/06/2013 04:23     Performed at [facility]   Culture     Final   Value:        BLOOD CULTURE RECEIVED NO GROWTH TO DATE CULTURE WILL BE HELD FOR 5 DAYS BEFORE ISSUING A FINAL NEGATIVE REPORT     Performed at [facility]   Report Status PENDING   Incomplete  MRSA PCR SCREENING     Status: Abnormal   Collection Time    04/06/13  3:42 AM      Result Value Ref Range Status   MRSA by PCR POSITIVE (*) NEGATIVE Final   Comment:  The GeneXpert MRSA Assay (FDA     approved for NASAL specimens     only), is one component of a     comprehensive MRSA colonization     surveillance program. It is not     intended to diagnose MRSA     infection nor to guide or     monitor treatment for     MRSA infections.     RESULT CALLED TO, READ BACK BY AND VERIFIED WITH:     CALLED TO RN [place] USSSERY [phone] @0524  THANEY    Blood Culture:    Component  Value Date/Time   SDES BLOOD RIGHT ARM 04/05/2013 2255   SPECREQUEST BOTTLES DRAWN AEROBIC ONLY 6CC 04/05/2013 2255   CULT  Value:        BLOOD CULTURE RECEIVED NO GROWTH TO DATE CULTURE WILL BE HELD FOR 5 DAYS BEFORE ISSUING A FINAL NEGATIVE REPORT Performed at [facility] 04/05/2013 2255   REPTSTATUS PENDING 04/05/2013 2255    Studies/Results: Ct Angio Chest Pe W/cm &/or Wo Cm  04/06/2013   CLINICAL DATA:  Hypoxia.  Shortness of breath.  EXAM: CT ANGIOGRAPHY CHEST WITH CONTRAST  TECHNIQUE: Multidetector CT imaging of the chest was performed using the standard protocol during bolus administration of intravenous contrast. Multiplanar CT image reconstructions and MIPs were obtained to evaluate the vascular anatomy.  CONTRAST:  160mL OMNIPAQUE IOHEXOL 350 MG/ML SOLN  COMPARISON:  05/07/2012  FINDINGS: There are filling defects within the right middle and lower lobe pulmonary arteries compatible with moderately large pulmonary emboli. Smaller emboli noted in the posterior right lower lobe. No emboli on the left. There are trace bilateral pleural effusions. Dependent atelectasis in the left lung. Diffuse ground-glass opacities throughout much of the left lung and to a lesser extent right lower lobe. This may reflect mild edema, atelectasis or alveolitis.  Heart is mildly enlarged. The right ventricle to left ventricle ratio is 1.06 (normal less than 0.9) suggesting a component of right heart strain.  No mediastinal, hilar, or axillary adenopathy. Chest wall soft tissues are unremarkable.  Imaging into the upper abdomen shows no acute findings. Will large low-density lesion within the right hepatic lobe is again noted which was shown on prior CT to be most compatible with hemangioma.  Review of the MIP images confirms the above findings.  IMPRESSION: Moderate middle lobe and lower lobe pulmonary emboli.  A component of mild right heart strain is suspected with a RV to LV ratio of 1.06.  Trace bilateral  pleural effusions. Dependent left-sided atelectasis.  Ground-glass opacities throughout the left lung and to a lesser extent right lower lobe may reflect edema, atelectasis or alveolitis.  Critical Value/emergent results were called by telephone at the time of interpretation on 04/06/2013 at 2:25 AM to Dr. Naaman Plummer, who verbally acknowledged these results.   Electronically Signed   By: Rolm Baptise M.D.   On: 04/06/2013 02:25   Dg Chest Port 1 View  (if Code Sepsis Called)  04/05/2013   CLINICAL DATA:  Sepsis.  Altered mental status.  EXAM: PORTABLE CHEST - 1 VIEW  COMPARISON:  DG CHEST 2 VIEW dated 04/03/2013; DG CHEST 1V PORT dated 02/25/2013; CT CHEST W/CM dated 05/07/2012  FINDINGS: Heart is borderline enlarged. Mild vascular congestion. Vague opacity throughout much of the left lung. Cannot exclude infiltrate/pneumonia. Patchy right perihilar opacity also noted. No effusions. No acute bony abnormality.  IMPRESSION: Vague diffuse opacities throughout the left lung and in the right perihilar region. Cannot exclude  infection. Asymmetric edema possible blood fell less likely.  Mild cardiomegaly.   Electronically Signed   By: Rolm Baptise M.D.   On: 04/05/2013 23:09    Medications:  Scheduled Meds: . aspirin EC  81 mg Oral Daily  . carvedilol  25 mg Oral BID WC  . Chlorhexidine Gluconate Cloth  6 each Topical QHS  . feeding supplement (GLUCERNA SHAKE)  237 mL Oral TID BM  . furosemide  20 mg Oral Daily  . insulin aspart  0-9 Units Subcutaneous TID WC  . insulin glargine  10 Units Subcutaneous QHS  . iron polysaccharides  150 mg Oral Daily  . levETIRAcetam  500 mg Oral BID  . mupirocin ointment  1 application Nasal BID  . pantoprazole  20 mg Oral Daily  . simvastatin  20 mg Oral Daily  . sodium chloride  3 mL Intravenous Q12H  . Warfarin - Pharmacist Dosing Inpatient   Does not apply q1800   Continuous Infusions: . heparin 1,600 Units/hr (04/07/13 1800)   PRN Meds:.acetaminophen, acetaminophen,  albuterol, docusate sodium  Antibiotics: Anti-infectives   Start     Dose/Rate Route Frequency Ordered Stop   04/06/13 1100  vancomycin (VANCOCIN) IVPB 750 mg/150 ml premix  Status:  Discontinued     750 mg 150 mL/hr over 60 Minutes Intravenous Every 12 hours 04/05/13 2242 04/06/13 0248   04/06/13 0500  piperacillin-tazobactam (ZOSYN) IVPB 3.375 g  Status:  Discontinued     3.375 g 12.5 mL/hr over 240 Minutes Intravenous Every 8 hours 04/05/13 2242 04/06/13 0248   04/05/13 2245  piperacillin-tazobactam (ZOSYN) IVPB 3.375 g     3.375 g 100 mL/hr over 30 Minutes Intravenous  Once 04/05/13 2234 04/05/13 2322   04/05/13 2245  vancomycin (VANCOCIN) IVPB 1000 mg/200 mL premix     1,000 mg 200 mL/hr over 60 Minutes Intravenous  Once 04/05/13 2234 04/05/13 2351     Antibiotics Given (last 72 hours)   None      Day of Hospitalization:  2 Consults:    Assessment/Plan:   Principal Problem:   Acute pulmonary embolus Active Problems:   DIABETES MELLITUS, TYPE II   Meningioma   Chronic systolic congestive heart failure   UTI (urinary tract infection)   Acute encephalopathy  #Acute pulmonary embolus -  VS stable; O2 sat 96% on 3L [place].  -continue heparin gtt per pharmacy until INR therapeutic -continue coumadin per pharmacy Lab Results  Component Value Date   INR 1.09 04/07/2013   INR 1.23 04/06/2013   INR 1.09 03/02/2013   #Acute encephalopathy - Resolved. -hold narcotics indefinitely -continue keppra for now and monitor clinically; follow-up with neurosurgery for continued use of keppra given side effect profile -PT/OT  #Microcytic Anemia: No obvious s/s of bleeding, Baseline Hb ~10. Pt has thrombocytosis.  History of mild antral gastritis in 2012. 2004 Colonoscopy revealed diverticulosis.  Pt iron is low, ferritin wnl but is an acute phase reactant so most likely false normal.  Pt given iron infusion during last admission and was started on niferex 150mg  daily.  Lab Results    Component Value Date   IRON <10* 04/04/2013   TIBC NOT CALC 04/04/2013   FERRITIN 208 04/04/2013   -FOBT x 3 pending  -Cont home PPI  -niferex 150mg  daily -colace 100mg  bid -CBC in AM -consider f/u with GI as outpatient  #Uncontrolled DMII: Most recent A1c 9.7 on 03/28/13. Home regimen includes lantus 25u qHS and SSI aspart.  -carb modified diet -SSI, sensitive  -  lantus 10units qhs   CBG (last 3)   Recent Labs  04/07/13 0734 04/07/13 1155 04/07/13 1607  GLUCAP 111* 154* 173*   #HTN: Normotensive at admission; goal<140/90.  Home regimen includes amlodipine 10, coreg 25 bid, lasix 20.   -continue coreg, lasix -will need to restart ACE-i  -restart home meds upon discharge; re-evaluate BP meds at next visit  #Chronic systolic HF: Myoview from 12/12/10 revealed EF 40% with diffuse hyopkinesis & abnl septal motion  -Appears euvolemic on exam, continue carvedilol -daily weights  -restart ACE-i  #Seizure disorder: due to meningioma, now s/p excision by craniotomy  -Continue Keppra 500 bid -follow-up with neurosurgery if continuing keppra indefintely  #H/o CVA: Nonfocal exam, continue ASA 81 & statin. Pt may have underlying vascular dementia that is contributing to her periods of forgetfulness.  -monitor  #Persistent hematuria: Pt has been referred to urology in the past for chronic bladder outlet obstruction but has not gone.  May be related to UTI but pt does have a h/o smoking.  Should consider follow up with urology as outpatient.  -monitor   #VTE ppx: on heparin gtt and coumadin   #Code Status : full (per prior records, when I asked patient, she responded "I really don't know")  #Dispo- Anticipated discharge today pending iron infusion.    LOS: 2 days   [name] Jewels, MD PGY-1, Internal Medicine  [phone] (7AM-5PM Mon-Fri) 04/07/2013, 10:15 PM

## 2013-04-07 NOTE — Clinical Social Work Note (Signed)
CSW met with patient and daughter at bedside to answer questions about HPOA/Living Will documentation. CSW instructed daughter to fill out the documentation and a chaplain will be by to notarize their signatures. Patient's daughter had questions about how to obtain her mothers bank records for [facility] Application. CSW asked if daughter has spoken with DSS social worker and explained her situation and she states that she has not. CSW encouraged daughter to contact the social worker she has been working with at [employer] who should be able to assist with finding a way to get the necessary documents for Medicaid Application. There are no other CSW needs at this time. CSW signing off.  Liz Beach, [place], [place], [phone]

## 2013-04-07 NOTE — Progress Notes (Signed)
Chaplain responded to request for AD.  Chaplain spoke to pt and pt's daughter and son about AD.  Pt was unwilling to initial the Living Will section.  Pt unable to voice her desire to proceed with AD.  Chaplain advised pt's family to contact the chaplain's office if pt seemed to want to proceed with an AD while pt is hospitalized.   04/07/13 1500  Clinical Encounter Type  Visited With Patient and family together  Visit Type Spiritual support    Estelle June, chaplain pager [phone]

## 2013-04-07 NOTE — Care Management Note (Signed)
    Page 1 of 1   04/10/2013     6:26:59 PM   CARE MANAGEMENT NOTE 04/10/2013  Patient:  [facility]   Account Number:  [id number]  Date Initiated:  04/06/2013  Documentation initiated by:  Marvetta Gibbons  Subjective/Objective Assessment:   Pt admitted with PE     Action/Plan:   PTA pt lived at home with daughter- just discharged and readmitted same day   Anticipated DC Date:  04/10/2013   Anticipated DC Plan:  [place] referral  Clinical Social Worker      DC Planning Services  CM consult      Choice offered to / List presented to:             Status of service:  Completed, signed off Medicare Important Message given?   (If response is "NO", the following Medicare IM given date fields will be blank) Date Medicare IM given:   Date Additional Medicare IM given:    Discharge Disposition:  HOME/SELF CARE  Per UR Regulation:  Reviewed for med. necessity/level of care/duration of stay  If discussed at [place] of Stay Meetings, dates discussed:    Comments:  04/06/13- 1400- Marvetta Gibbons RN, BSN [phone] Spoke with pt at bedside- per conversation pt states that she lives with her youngest daughter- has two other daughters- she uses cane at home but also has a rollator at home. Reports that she has had HH with [facility] in the past- but no active services at this time (confirmed this with Butch Penny with [facility]-pt is not active with HH) PT evals done on last admit- recommend SNF vs HH- pt would benefit from having PT re-eval on this admission- MD please order PT eval- NCM to cont. to follow. [facility] also is active in the home - CSW to f/u with pt and family regarding HCPOA needs.

## 2013-04-08 DIAGNOSIS — D509 Iron deficiency anemia, unspecified: Secondary | ICD-10-CM | POA: Diagnosis not present

## 2013-04-08 DIAGNOSIS — I10 Essential (primary) hypertension: Secondary | ICD-10-CM | POA: Diagnosis not present

## 2013-04-08 DIAGNOSIS — I2699 Other pulmonary embolism without acute cor pulmonale: Secondary | ICD-10-CM | POA: Diagnosis not present

## 2013-04-08 LAB — GLUCOSE, CAPILLARY
GLUCOSE-CAPILLARY: 137 mg/dL — AB (ref 70–99)
GLUCOSE-CAPILLARY: 210 mg/dL — AB (ref 70–99)
Glucose-Capillary: 122 mg/dL — ABNORMAL HIGH (ref 70–99)
Glucose-Capillary: 160 mg/dL — ABNORMAL HIGH (ref 70–99)

## 2013-04-08 LAB — PROTIME-INR
INR: 1.19 (ref 0.00–1.49)
Prothrombin Time: 14.8 seconds (ref 11.6–15.2)

## 2013-04-08 LAB — OCCULT BLOOD X 1 CARD TO LAB, STOOL: FECAL OCCULT BLD: NEGATIVE

## 2013-04-08 LAB — CBC
HCT: 25.4 % — ABNORMAL LOW (ref 36.0–46.0)
Hemoglobin: 7.8 g/dL — ABNORMAL LOW (ref 12.0–15.0)
MCH: 25.2 pg — ABNORMAL LOW (ref 26.0–34.0)
MCHC: 30.7 g/dL (ref 30.0–36.0)
MCV: 81.9 fL (ref 78.0–100.0)
PLATELETS: 669 10*3/uL — AB (ref 150–400)
RBC: 3.1 MIL/uL — ABNORMAL LOW (ref 3.87–5.11)
RDW: 17.7 % — AB (ref 11.5–15.5)
WBC: 12.9 10*3/uL — ABNORMAL HIGH (ref 4.0–10.5)

## 2013-04-08 LAB — HEPARIN LEVEL (UNFRACTIONATED): Heparin Unfractionated: 0.58 IU/mL (ref 0.30–0.70)

## 2013-04-08 MED ORDER — COUMADIN BOOK
1.0000 | Freq: Once | Status: AC
  Administered 2013-04-08: 1
  Filled 2013-04-08: qty 1

## 2013-04-08 MED ORDER — WARFARIN SODIUM 7.5 MG PO TABS
7.5000 mg | ORAL_TABLET | Freq: Once | ORAL | Status: AC
  Administered 2013-04-08: 7.5 mg via ORAL
  Filled 2013-04-08: qty 1

## 2013-04-08 MED ORDER — WARFARIN VIDEO
1.0000 | Freq: Once | Status: AC
  Administered 2013-04-08: 1

## 2013-04-08 NOTE — Progress Notes (Signed)
Subjective:   Pt has no new complaints this AM.  She is alert and oriented x 3.   Objective:   Vital signs in last 24 hours: Filed Vitals:   04/08/13 0350 04/08/13 0755 04/08/13 1055 04/08/13 1132  BP:  140/56 127/54   Pulse:  78 77   Temp:  98.8 F (37.1 C)  99.6 F (37.6 C)  TempSrc:  Oral  Oral  Resp:  25 27   Height: 5\' 4"  (1.626 m)     Weight: 187 lb 6.4 oz (85.004 kg)     SpO2:  96% 95%    Weight change: -3.4 oz (-0.096 kg)  Intake/Output Summary (Last 24 hours) at 04/08/13 1412 Last data filed at 04/08/13 1300  Gross per 24 hour  Intake    646 ml  Output      0 ml  Net    646 ml    Physical Exam: Constitutional: Vital signs reviewed.  Patient is in no acute distress and cooperative with exam.   Head: Normocephalic and atraumatic Eyes: EOMI, conjunctivae normal, no scleral icterus  Neck: Supple, Trachea midline Cardiovascular: RRR, no MRG, DP 2+ b/l Pulmonary/Chest: normal respiratory effort, CTAB, no wheezes, rales, or rhonchi Abdominal: Soft. +BS, Non-tender, non-distended, no suprapubic tenderness Neurological: A&O x3, cranial nerve II-XII are grossly intact, moving all extremities  Skin: Warm, dry and intact.  Psychiatric: Normal mood and affect.   Lab Results:  BMP:  Recent Labs Lab 04/03/13 0057  04/05/13 2254 04/06/13 0630  NA 143  < > 142 143  K 3.5*  < > 4.2 4.6  CL 104  < > 103 107  CO2 26  < > 24 21  GLUCOSE 115*  < > 202* 168*  BUN 16  < > 18 17  CREATININE 0.84  < > 0.92 1.09  CALCIUM 8.4  < > 8.6 8.2*  MG 1.8  --   --   --   PHOS 2.5  --   --   --   < > = values in this interval not displayed.  CBC:  Recent Labs Lab 04/05/13 0645 04/05/13 2254  04/07/13 0330 04/08/13 0634  WBC 10.8* 12.2*  < > 14.5* 12.9*  NEUTROABS 7.3 9.5*  --   --   --   HGB 7.5* 8.7*  < > 8.0* 7.8*  HCT 23.8* 27.7*  < > 25.6* 25.4*  MCV 81.2 80.5  < > 81.3 81.9  PLT 568* 731*  < > 666* 669*  < > = values in this interval not  displayed.  Coagulation:  Recent Labs Lab 04/06/13 1245 04/07/13 0330 04/08/13 0634  LABPROT 15.2 13.9 14.8  INR 1.23 1.09 1.19    CBG:            Recent Labs Lab 04/07/13 0734 04/07/13 1155 04/07/13 1607 04/07/13 2113 04/08/13 0759 04/08/13 1119  GLUCAP 111* 154* 173* 135* 122* 137*           HA1C:      No results found for this basename: HGBA1C,  in the last 168 hours  Lipid Panel: No results found for this basename: CHOL, HDL, LDLCALC, TRIG, CHOLHDL, LDLDIRECT,  in the last 168 hours  LFTs:  Recent Labs Lab 04/03/13 0057 04/05/13 2254  AST 11 18  ALT 10 11  ALKPHOS 79 106  BILITOT <0.2* 0.3  PROT 6.3 7.0  ALBUMIN 2.5* 2.7*    Pancreatic Enzymes: No results found for this basename: LIPASE, AMYLASE,  in the last 168 hours  Ammonia: No results found for this basename: AMMONIA,  in the last 168 hours  Cardiac Enzymes:  Recent Labs Lab 04/03/13 0057  TROPONINI <0.30   Lab Results  Component Value Date   CKTOTAL 34 02/25/2013   CKMB 2.6 11/10/2010   TROPONINI <0.30 04/03/2013    EKG:  Date/Time:  Thursday April 06 2013 02:05:09 EST Ventricular Rate:  87 PR Interval:  163 QRS Duration: 129 QT Interval:  400 QTC Calculation: 481 R Axis:   -49 Text Interpretation:  Sinus rhythm Left bundle branch block ED PHYSICIAN INTERPRETATION AVAILABLE IN [facility] Confirmed by TEST, RECORD ([id number]) on 04/08/2013 9:51:50 AM  BNP:  Recent Labs Lab 04/05/13 2254  PROBNP 3561.0*    D-Dimer: No results found for this basename: DDIMER,  in the last 168 hours  Urinalysis:  Recent Labs Lab 04/03/13 0238 04/06/13 0216  COLORURINE YELLOW YELLOW  LABSPEC 1.026 1.030  PHURINE 5.0 5.5  GLUCOSEU NEGATIVE NEGATIVE  HGBUR MODERATE* SMALL*  BILIRUBINUR NEGATIVE NEGATIVE  KETONESUR NEGATIVE 15*  PROTEINUR 100* 30*  UROBILINOGEN 0.2 0.2  NITRITE NEGATIVE NEGATIVE  LEUKOCYTESUR LARGE* LARGE*    Micro Results: Recent Results (from the past 240  hour(s))  URINE CULTURE     Status: None   Collection Time    04/03/13  2:38 AM      Result Value Ref Range Status   Specimen Description URINE, RANDOM   Final   Special Requests NONE   Final   Culture  Setup Time     Final   Value: 04/03/2013 03:07     Performed at [place]     Final   Value: NO GROWTH     Performed at [facility]   Culture     Final   Value: NO GROWTH     Performed at [facility]   Report Status 04/03/2013 FINAL   Final  CULTURE, BLOOD (ROUTINE X 2)     Status: None   Collection Time    04/05/13 10:45 PM      Result Value Ref Range Status   Specimen Description BLOOD LEFT ARM   Final   Special Requests BOTTLES DRAWN AEROBIC AND ANAEROBIC 10CC EACH   Final   Culture  Setup Time     Final   Value: 04/06/2013 04:24     Performed at [facility]   Culture     Final   Value:        BLOOD CULTURE RECEIVED NO GROWTH TO DATE CULTURE WILL BE HELD FOR 5 DAYS BEFORE ISSUING A FINAL NEGATIVE REPORT     Performed at [facility]   Report Status PENDING   Incomplete  CULTURE, BLOOD (ROUTINE X 2)     Status: None   Collection Time    04/05/13 10:55 PM      Result Value Ref Range Status   Specimen Description BLOOD RIGHT ARM   Final   Special Requests BOTTLES DRAWN AEROBIC ONLY 6CC   Final   Culture  Setup Time     Final   Value: 04/06/2013 04:23     Performed at [facility]   Culture     Final   Value:        BLOOD CULTURE RECEIVED NO GROWTH TO DATE CULTURE WILL BE HELD FOR 5 DAYS BEFORE ISSUING A FINAL NEGATIVE REPORT     Performed at [facility]  Report Status PENDING   Incomplete  MRSA PCR SCREENING     Status: Abnormal   Collection Time    04/06/13  3:42 AM      Result Value Ref Range Status   MRSA by PCR POSITIVE (*) NEGATIVE Final   Comment:            The GeneXpert MRSA Assay (FDA     approved for NASAL specimens     only), is one component of a     comprehensive MRSA  colonization     surveillance program. It is not     intended to diagnose MRSA     infection nor to guide or     monitor treatment for     MRSA infections.     RESULT CALLED TO, READ BACK BY AND VERIFIED WITH:     CALLED TO RN [place] USSSERY [id number] @0524  THANEY    Blood Culture:    Component Value Date/Time   SDES BLOOD RIGHT ARM 04/05/2013 2255   SPECREQUEST BOTTLES DRAWN AEROBIC ONLY 6CC 04/05/2013 2255   CULT  Value:        BLOOD CULTURE RECEIVED NO GROWTH TO DATE CULTURE WILL BE HELD FOR 5 DAYS BEFORE ISSUING A FINAL NEGATIVE REPORT Performed at [facility] 04/05/2013 2255   REPTSTATUS PENDING 04/05/2013 2255    Studies/Results: No results found.  Medications:  Scheduled Meds: . aspirin EC  81 mg Oral Daily  . carvedilol  25 mg Oral BID WC  . Chlorhexidine Gluconate Cloth  6 each Topical QHS  . feeding supplement (GLUCERNA SHAKE)  237 mL Oral TID BM  . furosemide  20 mg Oral Daily  . insulin aspart  0-9 Units Subcutaneous TID WC  . insulin glargine  10 Units Subcutaneous QHS  . iron polysaccharides  150 mg Oral Daily  . levETIRAcetam  500 mg Oral BID  . mupirocin ointment  1 application Nasal BID  . pantoprazole  20 mg Oral Daily  . simvastatin  20 mg Oral Daily  . sodium chloride  3 mL Intravenous Q12H  . warfarin  7.5 mg Oral ONCE-1800  . warfarin  1 each Does not apply Once  . Warfarin - Pharmacist Dosing Inpatient   Does not apply q1800   Continuous Infusions: . heparin 1,600 Units/hr (04/08/13 1300)   PRN Meds:.acetaminophen, acetaminophen, albuterol, docusate sodium  Antibiotics: Anti-infectives   Start     Dose/Rate Route Frequency Ordered Stop   04/06/13 1100  vancomycin (VANCOCIN) IVPB 750 mg/150 ml premix  Status:  Discontinued     750 mg 150 mL/hr over 60 Minutes Intravenous Every 12 hours 04/05/13 2242 04/06/13 0248   04/06/13 0500  piperacillin-tazobactam (ZOSYN) IVPB 3.375 g  Status:  Discontinued     3.375 g 12.5 mL/hr over 240 Minutes  Intravenous Every 8 hours 04/05/13 2242 04/06/13 0248   04/05/13 2245  piperacillin-tazobactam (ZOSYN) IVPB 3.375 g     3.375 g 100 mL/hr over 30 Minutes Intravenous  Once 04/05/13 2234 04/05/13 2322   04/05/13 2245  vancomycin (VANCOCIN) IVPB 1000 mg/200 mL premix     1,000 mg 200 mL/hr over 60 Minutes Intravenous  Once 04/05/13 2234 04/05/13 2351     Antibiotics Given (last 72 hours)   None      Day of Hospitalization:  3 Consults:    Assessment/Plan:   Principal Problem:   Acute pulmonary embolus Active Problems:   DIABETES MELLITUS, TYPE II   Meningioma   Chronic systolic  congestive heart failure   Risk for falls   UTI (urinary tract infection)   Acute encephalopathy  #Acute pulmonary embolus -  VS stable; O2 sat 96% on 3L [place].  -continue heparin gtt per pharmacy until INR therapeutic -continue coumadin per pharmacy -2D echo today  Lab Results  Component Value Date   INR 1.19 04/08/2013   INR 1.09 04/07/2013   INR 1.23 04/06/2013   #Acute encephalopathy - Resolved. -hold narcotics indefinitely -continue keppra for now and monitor clinically; follow-up with neurosurgery for continued use of keppra given side effect profile -PT/OT  #Microcytic Anemia: No obvious s/s of bleeding, Baseline Hb ~10. Pt has thrombocytosis.  History of mild antral gastritis in 2012. 2004 Colonoscopy revealed diverticulosis.  Pt iron is low, ferritin wnl but is an acute phase reactant so most likely false normal.  Pt given iron infusion during last admission and was started on niferex 150mg  daily.  Lab Results  Component Value Date   IRON <10* 04/04/2013   TIBC NOT CALC 04/04/2013   FERRITIN 208 04/04/2013   -FOBT x 3 pending  -Cont home PPI  -niferex 150mg  daily -colace 100mg  bid -CBC in AM -consider f/u with GI as outpatient  #Uncontrolled DMII: Most recent A1c 9.7 on 03/28/13. Home regimen includes lantus 25u qHS and SSI aspart.  -carb modified diet -SSI, sensitive  -lantus  10units qhs   CBG (last 3)   Recent Labs  04/07/13 2113 04/08/13 0759 04/08/13 1119  GLUCAP 135* 122* 137*   #HTN: Normotensive at admission; goal<140/90.  Home regimen includes amlodipine 10, coreg 25 bid, lasix 20.   -continue coreg, lasix -will need to restart ACE-i  -restart home meds upon discharge; re-evaluate BP meds at next visit  #Chronic systolic HF: Myoview from 12/12/10 revealed EF 40% with diffuse hyopkinesis & abnl septal motion  -Appears euvolemic on exam, continue carvedilol -daily weights  -restart ACE-i  #Seizure disorder: due to meningioma, now s/p excision by craniotomy  -Continue Keppra 500 bid -follow-up with neurosurgery if continuing keppra indefintely  #H/o CVA: Nonfocal exam, continue ASA 81 & statin. Pt may have underlying vascular dementia that is contributing to her periods of forgetfulness.  -monitor  #Persistent hematuria: Pt has been referred to urology in the past for chronic bladder outlet obstruction but has not gone.  May be related to UTI but pt does have a h/o smoking. Should consider follow up with urology as outpatient.  -monitor   #VTE ppx: on heparin gtt and coumadin   #Code Status : full   #Dispo- Deferred.    LOS: 3 days   Michail Jewels, MD PGY-1, Internal Medicine  [phone] (7AM-5PM Mon-Fri) 04/08/2013, 2:12 PM

## 2013-04-08 NOTE — Progress Notes (Signed)
Pharmacy Note-Anticoagulation  Pharmacy Consult :  80 y.o. female is currently on Coumadin with Heparin bridging for PE.   Latest Labs : Hematology :  Recent Labs  04/05/13 2254 04/06/13 1245 04/07/13 0112 04/07/13 0330 04/07/13 0941 04/07/13 2105 04/08/13 0634  HGB 8.7* 7.7*  --  8.0*  --   --  7.8*  HCT 27.7* 24.7*  --  25.6*  --   --  25.4*  PLT 731* 624*  --  666*  --   --  669*  LABPROT  --  15.2  --  13.9  --   --  14.8  INR  --  1.23  --  1.09  --   --  1.19  HEPARINUNFRC  --  0.10* 0.45  --  0.30 0.67 0.58    Lab Results  Component Value Date   INR 1.19 04/08/2013   INR 1.09 04/07/2013   INR 1.23 04/06/2013        HEPARINUNFRC 0.58 04/08/2013   HEPARINUNFRC 0.67 04/07/2013   HEPARINUNFRC 0.30 04/07/2013        HGB 7.8* 04/08/2013   HGB 8.0* 04/07/2013   HGB 7.7* 04/06/2013    Current Medication[s] Include: Scheduled:  Scheduled:  . aspirin EC  81 mg Oral Daily  . carvedilol  25 mg Oral BID WC  . Chlorhexidine Gluconate Cloth  6 each Topical QHS  . feeding supplement (GLUCERNA SHAKE)  237 mL Oral TID BM  . furosemide  20 mg Oral Daily  . insulin aspart  0-9 Units Subcutaneous TID WC  . insulin glargine  10 Units Subcutaneous QHS  . iron polysaccharides  150 mg Oral Daily  . levETIRAcetam  500 mg Oral BID  . mupirocin ointment  1 application Nasal BID  . pantoprazole  20 mg Oral Daily  . simvastatin  20 mg Oral Daily  . sodium chloride  3 mL Intravenous Q12H  . Warfarin - Pharmacist Dosing Inpatient   Does not apply q1800   Infusion[s]: Infusions:  . heparin 1,600 Units/hr (04/08/13 0851)   Antibiotic[s]: Off Antibiotics  Assessment :  Day # 3/5 minimum Coumadin + Heparin overlap.  Today's INR is 1.19 and remains near baseline.  Patient has received all ordered Coumadin doses.    Heparin level is stable on 1600 units/hr.  Heparin level 0.58 units/ml.  No bleeding complications observed.  No identifiably significant drug or food interactions with  Coumadin.  Goal :  INR goal is 2-3    Heparin goal is Heparin level 0.3-0.7 units/ml.  Plan : 1. Heparin will be continued at same rate.    2. Increase Coumadin to 7.5 mg today. 3. Daily Heparin level, INR, CBC, and Monitor for bleeding complications.  Tomie Spizzirri, Craig Guess, Pharm.D. 04/08/2013  10:31 AM

## 2013-04-08 NOTE — Progress Notes (Signed)
  Date: 04/08/2013  Patient name: [name]  Medical record number: [id number]  Date of birth: 11-03-1933   This patient has been seen and the plan of care was discussed with the house staff. Please see their note for complete details. I concur with their findings with the following additions/corrections:  Stable at this time and on anticoagulation.  Thayer Headings, MD 04/08/2013, 11:29 AM

## 2013-04-09 DIAGNOSIS — I2699 Other pulmonary embolism without acute cor pulmonale: Secondary | ICD-10-CM | POA: Diagnosis not present

## 2013-04-09 LAB — CBC
HCT: 24.1 % — ABNORMAL LOW (ref 36.0–46.0)
Hemoglobin: 7.4 g/dL — ABNORMAL LOW (ref 12.0–15.0)
MCH: 25 pg — ABNORMAL LOW (ref 26.0–34.0)
MCHC: 30.7 g/dL (ref 30.0–36.0)
MCV: 81.4 fL (ref 78.0–100.0)
Platelets: 663 10*3/uL — ABNORMAL HIGH (ref 150–400)
RBC: 2.96 MIL/uL — ABNORMAL LOW (ref 3.87–5.11)
RDW: 17.6 % — AB (ref 11.5–15.5)
WBC: 12.8 10*3/uL — AB (ref 4.0–10.5)

## 2013-04-09 LAB — GLUCOSE, CAPILLARY
GLUCOSE-CAPILLARY: 208 mg/dL — AB (ref 70–99)
GLUCOSE-CAPILLARY: 140 mg/dL — AB (ref 70–99)
GLUCOSE-CAPILLARY: 170 mg/dL — AB (ref 70–99)
Glucose-Capillary: 115 mg/dL — ABNORMAL HIGH (ref 70–99)

## 2013-04-09 LAB — BASIC METABOLIC PANEL
BUN: 10 mg/dL (ref 6–23)
CO2: 24 meq/L (ref 19–32)
CREATININE: 0.94 mg/dL (ref 0.50–1.10)
Calcium: 8.5 mg/dL (ref 8.4–10.5)
Chloride: 105 mEq/L (ref 96–112)
GFR calc non Af Amer: 57 mL/min — ABNORMAL LOW (ref >90)
GFR, EST AFRICAN AMERICAN: 66 mL/min — AB (ref >90)
Glucose, Bld: 116 mg/dL — ABNORMAL HIGH (ref 70–99)
Potassium: 3.4 mEq/L — ABNORMAL LOW (ref 3.7–5.3)
Sodium: 140 mEq/L (ref 137–147)

## 2013-04-09 LAB — HEPARIN LEVEL (UNFRACTIONATED): HEPARIN UNFRACTIONATED: 0.58 [IU]/mL (ref 0.30–0.70)

## 2013-04-09 LAB — PROTIME-INR
INR: 1.24 (ref 0.00–1.49)
Prothrombin Time: 15.3 seconds — ABNORMAL HIGH (ref 11.6–15.2)

## 2013-04-09 MED ORDER — WARFARIN SODIUM 7.5 MG PO TABS
7.5000 mg | ORAL_TABLET | Freq: Once | ORAL | Status: AC
  Administered 2013-04-09: 7.5 mg via ORAL
  Filled 2013-04-09: qty 1

## 2013-04-09 MED ORDER — AMLODIPINE BESYLATE 10 MG PO TABS
10.0000 mg | ORAL_TABLET | Freq: Every day | ORAL | Status: DC
  Administered 2013-04-09 – 2013-04-10 (×2): 10 mg via ORAL
  Filled 2013-04-09 (×2): qty 1

## 2013-04-09 MED ORDER — POTASSIUM CHLORIDE CRYS ER 20 MEQ PO TBCR
20.0000 meq | EXTENDED_RELEASE_TABLET | Freq: Two times a day (BID) | ORAL | Status: DC
  Administered 2013-04-09 – 2013-04-10 (×2): 20 meq via ORAL
  Filled 2013-04-09 (×3): qty 1

## 2013-04-09 NOTE — Progress Notes (Signed)
RN walked into patient's room to find her IV unhooked and her gown wet. Patient has a hep drip running at this time.RN called pharmacy to inform them of the incident- RN was told that they will look very closely at morning labs to make sure that her hep levels are fine. RN flushed IV and reattached hep drip- will continue to monitor

## 2013-04-09 NOTE — Progress Notes (Signed)
Patient Transfer in from SDU Patient Alert and oriented No signs of distress. Suction set up and fall precaution explained. Patient alert and oriented x2-3. Patient skin intact with scattered fatty tissue on bilateral arms and scattered bruised to lower extremities. Patient sacral is intact however the skin appear fragile from moisture. Patient used the bedpan when offered.

## 2013-04-09 NOTE — Progress Notes (Signed)
  Echocardiogram 2D Echocardiogram has been performed.  [name] 04/09/2013, 10:14 AM

## 2013-04-09 NOTE — Progress Notes (Signed)
Pharmacy Note-Anticoagulation  Pharmacy Consult :  80 y.o. female is currently on Coumadin with Heparin bridging for PE.   Latest Labs : Hematology :  Recent Labs  04/06/13 1245 04/07/13 0112 04/07/13 0330 04/07/13 0941 04/07/13 2105 04/08/13 0634 04/09/13 0317  HGB 7.7*  --  8.0*  --   --  7.8* 7.4*  HCT 24.7*  --  25.6*  --   --  25.4* 24.1*  PLT 624*  --  666*  --   --  669* 663*  LABPROT 15.2  --  13.9  --   --  14.8 15.3*  INR 1.23  --  1.09  --   --  1.19 1.24  HEPARINUNFRC 0.10* 0.45  --  0.30 0.67 0.58 0.58    Lab Results  Component Value Date   INR 1.24 04/09/2013   INR 1.19 04/08/2013   INR 1.09 04/07/2013        HEPARINUNFRC 0.58 04/09/2013   HEPARINUNFRC 0.58 04/08/2013   HEPARINUNFRC 0.67 04/07/2013        HGB 7.4* 04/09/2013   HGB 7.8* 04/08/2013   HGB 8.0* 04/07/2013    Current Medication[s] Include: Medication PTA: Scheduled:  . aspirin EC  81 mg Oral Daily  . carvedilol  25 mg Oral BID WC  . Chlorhexidine Gluconate Cloth  6 each Topical QHS  . feeding supplement (GLUCERNA SHAKE)  237 mL Oral TID BM  . furosemide  20 mg Oral Daily  . insulin aspart  0-9 Units Subcutaneous TID WC  . insulin glargine  10 Units Subcutaneous QHS  . iron polysaccharides  150 mg Oral Daily  . levETIRAcetam  500 mg Oral BID  . mupirocin ointment  1 application Nasal BID  . pantoprazole  20 mg Oral Daily  . simvastatin  20 mg Oral Daily  . sodium chloride  3 mL Intravenous Q12H  . Warfarin - Pharmacist Dosing Inpatient   Does not apply q1800   Scheduled:  Infusion[s]: Infusions:  . heparin 1,600 Units/hr (04/09/13 0800)   Assessment :  Today's INR is slowly trending up after dose increases.   INR is 1.24.    Heparin level is within therapeutic range, stable,  0.58 units/ml.  No bleeding complications observed.  Goal :  INR goal is 2-3    Heparin goal is Heparin level 0.3-0.7 units/ml.  Plan : 1. Heparin will be continued at same rate.    2. Repeat Coumadin  7.5 mg today. 3. Daily Heparin level, INR, CBC, and Monitor for bleeding complications.  Rozelle Caudle, Craig Guess, Pharm.D. 04/09/2013  9:51 AM

## 2013-04-09 NOTE — Progress Notes (Signed)
Subjective:   No overnight events. Feels better.   Objective:   Vital signs in last 24 hours: Filed Vitals:   04/08/13 1920 04/08/13 2304 04/09/13 0330 04/09/13 0700  BP: 125/39 132/65 151/57   Pulse: 81 77 77   Temp: 99.2 F (37.3 C) 98.8 F (37.1 C) 99.1 F (37.3 C) 99.4 F (37.4 C)  TempSrc: Oral Oral Oral Oral  Resp: 29 23 21    Height:      Weight:   194 lb 3.6 oz (88.1 kg)   SpO2: 95% 98% 96%    Weight change: 6 lb 13.2 oz (3.096 kg)  Intake/Output Summary (Last 24 hours) at 04/09/13 1029 Last data filed at 04/09/13 0800  Gross per 24 hour  Intake   1130 ml  Output      0 ml  Net   1130 ml    Physical Exam: Constitutional: Vital signs reviewed.  Having breakfast on room air Cardiovascular: RRR, no MRG,  Pulmonary/Chest: normal respiratory effort, CTAB, no wheezes, rales, or rhonchi Abdominal: Soft. +BS, Non-tender, non-distended, no suprapubic tenderness Neurological: A&O x3 Skin: Warm, dry and intact.  Psychiatric: Normal mood and affect.   Lab Results:  BMP:  Recent Labs Lab 04/03/13 0057  04/06/13 0630 04/09/13 0317  NA 143  < > 143 140  K 3.5*  < > 4.6 3.4*  CL 104  < > 107 105  CO2 26  < > 21 24  GLUCOSE 115*  < > 168* 116*  BUN 16  < > 17 10  CREATININE 0.84  < > 1.09 0.94  CALCIUM 8.4  < > 8.2* 8.5  MG 1.8  --   --   --   PHOS 2.5  --   --   --   < > = values in this interval not displayed.  CBC:  Recent Labs Lab 04/05/13 0645 04/05/13 2254  04/08/13 0634 04/09/13 0317  WBC 10.8* 12.2*  < > 12.9* 12.8*  NEUTROABS 7.3 9.5*  --   --   --   HGB 7.5* 8.7*  < > 7.8* 7.4*  HCT 23.8* 27.7*  < > 25.4* 24.1*  MCV 81.2 80.5  < > 81.9 81.4  PLT 568* 731*  < > 669* 663*  < > = values in this interval not displayed.  Coagulation:  Recent Labs Lab 04/06/13 1245 04/07/13 0330 04/08/13 0634 04/09/13 0317  LABPROT 15.2 13.9 14.8 15.3*  INR 1.23 1.09 1.19 1.24    CBG:            Recent Labs Lab 04/07/13 2113 04/08/13 0759  04/08/13 1119 04/08/13 1727 04/08/13 2201 04/09/13 0758  GLUCAP 135* 122* 137* 210* 160* 115*           LFTs:  Recent Labs Lab 04/03/13 0057 04/05/13 2254  AST 11 18  ALT 10 11  ALKPHOS 79 106  BILITOT <0.2* 0.3  PROT 6.3 7.0  ALBUMIN 2.5* 2.7*    Cardiac Enzymes:  Recent Labs Lab 04/03/13 0057  TROPONINI <0.30   Lab Results  Component Value Date   CKTOTAL 34 02/25/2013   CKMB 2.6 11/10/2010   TROPONINI <0.30 04/03/2013    EKG:  Date/Time:  Thursday April 06 2013 02:05:09 EST Ventricular Rate:  87 PR Interval:  163 QRS Duration: 129 QT Interval:  400 QTC Calculation: 481 R Axis:   -49 Text Interpretation:  Sinus rhythm Left bundle branch block ED PHYSICIAN INTERPRETATION AVAILABLE IN [facility] Confirmed by TEST, RECORD (  [id number]) on 04/08/2013 9:51:50 AM  BNP:  Recent Labs Lab 04/05/13 2254  PROBNP 3561.0*   Urinalysis:  Recent Labs Lab 04/03/13 0238 04/06/13 0216  COLORURINE YELLOW YELLOW  LABSPEC 1.026 1.030  PHURINE 5.0 5.5  GLUCOSEU NEGATIVE NEGATIVE  HGBUR MODERATE* SMALL*  BILIRUBINUR NEGATIVE NEGATIVE  KETONESUR NEGATIVE 15*  PROTEINUR 100* 30*  UROBILINOGEN 0.2 0.2  NITRITE NEGATIVE NEGATIVE  LEUKOCYTESUR LARGE* LARGE*    Micro Results: Recent Results (from the past 240 hour(s))  URINE CULTURE     Status: None   Collection Time    04/03/13  2:38 AM      Result Value Ref Range Status   Specimen Description URINE, RANDOM   Final   Special Requests NONE   Final   Culture  Setup Time     Final   Value: 04/03/2013 03:07     Performed at [facility]     Final   Value: NO GROWTH     Performed at [facility]   Culture     Final   Value: NO GROWTH     Performed at [facility]   Report Status 04/03/2013 FINAL   Final  CULTURE, BLOOD (ROUTINE X 2)     Status: None   Collection Time    04/05/13 10:45 PM      Result Value Ref Range Status   Specimen Description BLOOD LEFT ARM   Final    Special Requests BOTTLES DRAWN AEROBIC AND ANAEROBIC 10CC EACH   Final   Culture  Setup Time     Final   Value: 04/06/2013 04:24     Performed at [facility]   Culture     Final   Value:        BLOOD CULTURE RECEIVED NO GROWTH TO DATE CULTURE WILL BE HELD FOR 5 DAYS BEFORE ISSUING A FINAL NEGATIVE REPORT     Performed at [facility]   Report Status PENDING   Incomplete  CULTURE, BLOOD (ROUTINE X 2)     Status: None   Collection Time    04/05/13 10:55 PM      Result Value Ref Range Status   Specimen Description BLOOD RIGHT ARM   Final   Special Requests BOTTLES DRAWN AEROBIC ONLY 6CC   Final   Culture  Setup Time     Final   Value: 04/06/2013 04:23     Performed at [facility]   Culture     Final   Value:        BLOOD CULTURE RECEIVED NO GROWTH TO DATE CULTURE WILL BE HELD FOR 5 DAYS BEFORE ISSUING A FINAL NEGATIVE REPORT     Performed at [facility]   Report Status PENDING   Incomplete  MRSA PCR SCREENING     Status: Abnormal   Collection Time    04/06/13  3:42 AM      Result Value Ref Range Status   MRSA by PCR POSITIVE (*) NEGATIVE Final   Comment:            The GeneXpert MRSA Assay (FDA     approved for NASAL specimens     only), is one component of a     comprehensive MRSA colonization     surveillance program. It is not     intended to diagnose MRSA     infection nor to guide or     monitor treatment for  MRSA infections.     RESULT CALLED TO, READ BACK BY AND VERIFIED WITH:     CALLED TO RN [place] USSSERY [phone] @0524  THANEY    Blood Culture:    Component Value Date/Time   SDES BLOOD RIGHT ARM 04/05/2013 2255   SPECREQUEST BOTTLES DRAWN AEROBIC ONLY 6CC 04/05/2013 2255   CULT  Value:        BLOOD CULTURE RECEIVED NO GROWTH TO DATE CULTURE WILL BE HELD FOR 5 DAYS BEFORE ISSUING A FINAL NEGATIVE REPORT Performed at [facility] 04/05/2013 2255   REPTSTATUS PENDING 04/05/2013 2255    Studies/Results: No results  found.  Medications:  Scheduled Meds: . aspirin EC  81 mg Oral Daily  . carvedilol  25 mg Oral BID WC  . Chlorhexidine Gluconate Cloth  6 each Topical QHS  . feeding supplement (GLUCERNA SHAKE)  237 mL Oral TID BM  . furosemide  20 mg Oral Daily  . insulin aspart  0-9 Units Subcutaneous TID WC  . insulin glargine  10 Units Subcutaneous QHS  . iron polysaccharides  150 mg Oral Daily  . levETIRAcetam  500 mg Oral BID  . mupirocin ointment  1 application Nasal BID  . pantoprazole  20 mg Oral Daily  . simvastatin  20 mg Oral Daily  . sodium chloride  3 mL Intravenous Q12H  . warfarin  7.5 mg Oral ONCE-1800  . Warfarin - Pharmacist Dosing Inpatient   Does not apply q1800   Continuous Infusions: . heparin 1,600 Units/hr (04/09/13 0800)   PRN Meds:.acetaminophen, acetaminophen, albuterol, docusate sodium  Antibiotics: Anti-infectives   Start     Dose/Rate Route Frequency Ordered Stop   04/06/13 1100  vancomycin (VANCOCIN) IVPB 750 mg/150 ml premix  Status:  Discontinued     750 mg 150 mL/hr over 60 Minutes Intravenous Every 12 hours 04/05/13 2242 04/06/13 0248   04/06/13 0500  piperacillin-tazobactam (ZOSYN) IVPB 3.375 g  Status:  Discontinued     3.375 g 12.5 mL/hr over 240 Minutes Intravenous Every 8 hours 04/05/13 2242 04/06/13 0248   04/05/13 2245  piperacillin-tazobactam (ZOSYN) IVPB 3.375 g     3.375 g 100 mL/hr over 30 Minutes Intravenous  Once 04/05/13 2234 04/05/13 2322   04/05/13 2245  vancomycin (VANCOCIN) IVPB 1000 mg/200 mL premix     1,000 mg 200 mL/hr over 60 Minutes Intravenous  Once 04/05/13 2234 04/05/13 2351     Antibiotics Given (last 72 hours)   None      Day of Hospitalization:  4 Consults:    Assessment/Plan:   Principal Problem:   Acute pulmonary embolus Active Problems:   DIABETES MELLITUS, TYPE II   Meningioma   Chronic systolic congestive heart failure   Risk for falls   UTI (urinary tract infection)   Acute encephalopathy  #Acute  pulmonary embolus: Confirmed on chest CT on 04/06/2013. The RV to LV ratio is 1.06 (normal <0.9) suggesting a component of right heart strain. She was hypoxic on presentation but this has resolved on nasal cannula oxygen supplementation currently on 2 L/nin. PESI score: Class V, Very High Risk: 10.0-24.5% 30-day mortality in this group.  Plan  -continue heparin gtt per pharmacy until INR therapeutic. Will evaluate for possibility of NOACs but this may be limited by cost.  -continue coumadin per pharmacy -2D echo performed to assess right heart function >> awaiting results  - will transfer to tele today  #Acute encephalopathy - Resolved. -hold narcotics indefinitely -continue keppra for now and monitor clinically;  -  follow-up with neurosurgery as outpatient and consider d/c if no longer required  -PT/OT  #Microcytic Anemia: No obvious s/s of bleeding, Baseline Hb ~10. Pt has thrombocytosis.  History of mild antral gastritis in 2012. 2004 Colonoscopy revealed diverticulosis.  Pt iron is low, ferritin wnl but is an acute phase reactant so most likely false normal.  Pt given iron infusion during last admission and was started on niferex 150mg  daily. Plan  -FOBT x 3 pending  -Cont home PPI  -niferex 150mg  daily -colace 100mg  bid -CBC in AM -consider f/u with GI as outpatient  #Uncontrolled DMII: Most recent A1c 9.7 on 03/28/13. Home regimen includes lantus 25u qHS and SSI aspart.  -carb modified diet -SSI, sensitive  -lantus 10units qhs   #HTN: Normotensive at admission; goal<140/90.  Home regimen includes amlodipine 10, coreg 25 bid, lasix 20.   -continue coreg, lasix - restart Amlodipine 10 mg daily    #VTE ppx: on heparin gtt and coumadin   #Code Status : full   #Dispo- will be discharged once INR is therapeutic.      LOS: 4 days   Jessee Avers, MD 04/09/2013, 10:29 AM

## 2013-04-10 ENCOUNTER — Ambulatory Visit: Payer: Medicare Other

## 2013-04-10 ENCOUNTER — Ambulatory Visit: Payer: Medicare Other | Admitting: Radiation Oncology

## 2013-04-10 DIAGNOSIS — I2699 Other pulmonary embolism without acute cor pulmonale: Secondary | ICD-10-CM | POA: Diagnosis not present

## 2013-04-10 DIAGNOSIS — I5030 Unspecified diastolic (congestive) heart failure: Secondary | ICD-10-CM

## 2013-04-10 DIAGNOSIS — D509 Iron deficiency anemia, unspecified: Secondary | ICD-10-CM | POA: Diagnosis not present

## 2013-04-10 DIAGNOSIS — I10 Essential (primary) hypertension: Secondary | ICD-10-CM | POA: Diagnosis not present

## 2013-04-10 LAB — PROTIME-INR
INR: 1.31 (ref 0.00–1.49)
Prothrombin Time: 16 seconds — ABNORMAL HIGH (ref 11.6–15.2)

## 2013-04-10 LAB — GLUCOSE, CAPILLARY
Glucose-Capillary: 114 mg/dL — ABNORMAL HIGH (ref 70–99)
Glucose-Capillary: 208 mg/dL — ABNORMAL HIGH (ref 70–99)

## 2013-04-10 LAB — BASIC METABOLIC PANEL
BUN: 10 mg/dL (ref 6–23)
CO2: 26 mEq/L (ref 19–32)
Calcium: 8.6 mg/dL (ref 8.4–10.5)
Chloride: 108 mEq/L (ref 96–112)
Creatinine, Ser: 0.87 mg/dL (ref 0.50–1.10)
GFR calc Af Amer: 73 mL/min — ABNORMAL LOW (ref >90)
GFR, EST NON AFRICAN AMERICAN: 63 mL/min — AB (ref >90)
Glucose, Bld: 115 mg/dL — ABNORMAL HIGH (ref 70–99)
Potassium: 3.8 mEq/L (ref 3.7–5.3)
Sodium: 145 mEq/L (ref 137–147)

## 2013-04-10 LAB — CBC
HCT: 25.3 % — ABNORMAL LOW (ref 36.0–46.0)
Hemoglobin: 7.7 g/dL — ABNORMAL LOW (ref 12.0–15.0)
MCH: 24.8 pg — ABNORMAL LOW (ref 26.0–34.0)
MCHC: 30.4 g/dL (ref 30.0–36.0)
MCV: 81.6 fL (ref 78.0–100.0)
PLATELETS: 665 10*3/uL — AB (ref 150–400)
RBC: 3.1 MIL/uL — ABNORMAL LOW (ref 3.87–5.11)
RDW: 17.6 % — ABNORMAL HIGH (ref 11.5–15.5)
WBC: 13.9 10*3/uL — AB (ref 4.0–10.5)

## 2013-04-10 LAB — HEPARIN LEVEL (UNFRACTIONATED): HEPARIN UNFRACTIONATED: 0.78 [IU]/mL — AB (ref 0.30–0.70)

## 2013-04-10 MED ORDER — LISINOPRIL 5 MG PO TABS: 5.0000 mg | ORAL_TABLET | Freq: Every day | ORAL | Status: DC

## 2013-04-10 MED ORDER — ENOXAPARIN SODIUM 100 MG/ML ~~LOC~~ SOLN: 1.5000 mg/kg | SUBCUTANEOUS | Status: DC

## 2013-04-10 MED ORDER — LISINOPRIL 5 MG PO TABS
5.0000 mg | ORAL_TABLET | Freq: Every day | ORAL | Status: DC
  Administered 2013-04-10: 5 mg via ORAL
  Filled 2013-04-10: qty 1

## 2013-04-10 MED ORDER — WARFARIN SODIUM 7.5 MG PO TABS
7.5000 mg | ORAL_TABLET | Freq: Once | ORAL | Status: DC
  Filled 2013-04-10: qty 1

## 2013-04-10 MED ORDER — WARFARIN SODIUM 7.5 MG PO TABS: ORAL_TABLET | ORAL | Status: DC

## 2013-04-10 MED ORDER — WARFARIN SODIUM 7.5 MG PO TABS: 7.5000 mg | ORAL_TABLET | Freq: Once | ORAL | Status: DC

## 2013-04-10 MED ORDER — ENOXAPARIN SODIUM 150 MG/ML ~~LOC~~ SOLN
130.0000 mg | SUBCUTANEOUS | Status: DC
  Administered 2013-04-10: 130 mg via SUBCUTANEOUS
  Filled 2013-04-10: qty 1

## 2013-04-10 NOTE — Progress Notes (Signed)
Patient was discharged home by MD order; discharged instructions  review and give to patient and daughter with care notes and prescriptions;  Patient and daughter were educated with Lovenox administration;  IV DIC; skin intact; patient will be escorted to the car by nurse tech via wheelchair.

## 2013-04-10 NOTE — Progress Notes (Signed)
Subjective:   Pt has no new complaints this AM; she denies any CP or SOB.  She is currently not on O2 and is alert and oriented x 3.  Pt was transferred out of SDU yesterday.   Objective:   Vital signs in last 24 hours: Filed Vitals:   04/09/13 1605 04/09/13 2134 04/10/13 0425 04/10/13 0520  BP: 134/76 134/74  157/80  Pulse: 85 74  78  Temp: 98.3 F (36.8 C) 98.4 F (36.9 C)  98 F (36.7 C)  TempSrc: Tympanic Oral  Oral  Resp: 28 22  20   Height: 5\' 4"  (1.626 m)     Weight: 193 lb 5.5 oz (87.7 kg)  190 lb 14.7 oz (86.6 kg)   SpO2: 95% 96%  93%   Weight change: -14.1 oz (-0.4 kg)  Intake/Output Summary (Last 24 hours) at 04/10/13 1000 Last data filed at 04/09/13 1614  Gross per 24 hour  Intake    395 ml  Output    175 ml  Net    220 ml    Physical Exam: Constitutional: Vital signs reviewed.  Patient lying in bed in no acute distress and cooperative with exam.   Head: Normocephalic and atraumatic Eyes: EOMI, conjunctivae normal, no scleral icterus  Neck: Supple, Trachea midline Cardiovascular: RRR, no MRG, DP 2+ b/l Pulmonary/Chest: normal respiratory effort, CTAB, no wheezes, rales, or rhonchi Abdominal: Soft. +BS, Non-tender, non-distended, no suprapubic tenderness Neurological: A&O x3, cranial nerve II-XII are grossly intact, moving all extremities  Skin: Warm, dry and intact.  Psychiatric: Normal mood and affect.   Lab Results:  BMP:  Recent Labs Lab 04/09/13 0317 04/10/13 0646  NA 140 145  K 3.4* 3.8  CL 105 108  CO2 24 26  GLUCOSE 116* 115*  BUN 10 10  CREATININE 0.94 0.87  CALCIUM 8.5 8.6    CBC:  Recent Labs Lab 04/05/13 0645 04/05/13 2254  04/09/13 0317 04/10/13 0646  WBC 10.8* 12.2*  < > 12.8* 13.9*  NEUTROABS 7.3 9.5*  --   --   --   HGB 7.5* 8.7*  < > 7.4* 7.7*  HCT 23.8* 27.7*  < > 24.1* 25.3*  MCV 81.2 80.5  < > 81.4 81.6  PLT 568* 731*  < > 663* 665*  < > = values in this interval not displayed.  Coagulation:  Recent  Labs Lab 04/07/13 0330 04/08/13 0634 04/09/13 0317 04/10/13 0646  LABPROT 13.9 14.8 15.3* 16.0*  INR 1.09 1.19 1.24 1.31    CBG:            Recent Labs Lab 04/08/13 2201 04/09/13 0758 04/09/13 1150 04/09/13 1728 04/09/13 2137 04/10/13 0802  GLUCAP 160* 115* 208* 140* 170* 114*           HA1C:      No results found for this basename: HGBA1C,  in the last 168 hours  Lipid Panel: No results found for this basename: CHOL, HDL, LDLCALC, TRIG, CHOLHDL, LDLDIRECT,  in the last 168 hours  LFTs:  Recent Labs Lab 04/05/13 2254  AST 18  ALT 11  ALKPHOS 106  BILITOT 0.3  PROT 7.0  ALBUMIN 2.7*    Pancreatic Enzymes: No results found for this basename: LIPASE, AMYLASE,  in the last 168 hours  Ammonia: No results found for this basename: AMMONIA,  in the last 168 hours  Cardiac Enzymes: No results found for this basename: CKTOTAL, CKMB, CKMBINDEX, TROPONINI,  in the last 168 hours Lab Results  Component Value  Date   CKTOTAL 34 02/25/2013   CKMB 2.6 11/10/2010   TROPONINI <0.30 04/03/2013    EKG:  Date/Time:  Thursday April 06 2013 02:05:09 EST Ventricular Rate:  87 PR Interval:  163 QRS Duration: 129 QT Interval:  400 QTC Calculation: 481 R Axis:   -49 Text Interpretation:  Sinus rhythm Left bundle branch block ED PHYSICIAN INTERPRETATION AVAILABLE IN [facility] Confirmed by TEST, RECORD ([id number]) on 04/08/2013 9:51:50 AM  BNP:  Recent Labs Lab 04/05/13 2254  PROBNP 3561.0*    D-Dimer: No results found for this basename: DDIMER,  in the last 168 hours  Urinalysis:  Recent Labs Lab 04/06/13 0216  COLORURINE YELLOW  LABSPEC 1.030  PHURINE 5.5  GLUCOSEU NEGATIVE  HGBUR SMALL*  BILIRUBINUR NEGATIVE  KETONESUR 15*  PROTEINUR 30*  UROBILINOGEN 0.2  NITRITE NEGATIVE  LEUKOCYTESUR LARGE*    Micro Results: Recent Results (from the past 240 hour(s))  URINE CULTURE     Status: None   Collection Time    04/03/13  2:38 AM      Result Value  Ref Range Status   Specimen Description URINE, RANDOM   Final   Special Requests NONE   Final   Culture  Setup Time     Final   Value: 04/03/2013 03:07     Performed at [place]     Final   Value: NO GROWTH     Performed at [facility]   Culture     Final   Value: NO GROWTH     Performed at [facility]   Report Status 04/03/2013 FINAL   Final  CULTURE, BLOOD (ROUTINE X 2)     Status: None   Collection Time    04/05/13 10:45 PM      Result Value Ref Range Status   Specimen Description BLOOD LEFT ARM   Final   Special Requests BOTTLES DRAWN AEROBIC AND ANAEROBIC 10CC EACH   Final   Culture  Setup Time     Final   Value: 04/06/2013 04:24     Performed at [facility]   Culture     Final   Value:        BLOOD CULTURE RECEIVED NO GROWTH TO DATE CULTURE WILL BE HELD FOR 5 DAYS BEFORE ISSUING A FINAL NEGATIVE REPORT     Performed at [facility]   Report Status PENDING   Incomplete  CULTURE, BLOOD (ROUTINE X 2)     Status: None   Collection Time    04/05/13 10:55 PM      Result Value Ref Range Status   Specimen Description BLOOD RIGHT ARM   Final   Special Requests BOTTLES DRAWN AEROBIC ONLY 6CC   Final   Culture  Setup Time     Final   Value: 04/06/2013 04:23     Performed at [facility]   Culture     Final   Value:        BLOOD CULTURE RECEIVED NO GROWTH TO DATE CULTURE WILL BE HELD FOR 5 DAYS BEFORE ISSUING A FINAL NEGATIVE REPORT     Performed at [facility]   Report Status PENDING   Incomplete  MRSA PCR SCREENING     Status: Abnormal   Collection Time    04/06/13  3:42 AM      Result Value Ref Range Status   MRSA by PCR POSITIVE (*) NEGATIVE Final   Comment:  The GeneXpert MRSA Assay (FDA     approved for NASAL specimens     only), is one component of a     comprehensive MRSA colonization     surveillance program. It is not     intended to diagnose MRSA     infection nor to guide  or     monitor treatment for     MRSA infections.     RESULT CALLED TO, READ BACK BY AND VERIFIED WITH:     CALLED TO RN [place] USSSERY [phone] @0524  THANEY    Blood Culture:    Component Value Date/Time   SDES BLOOD RIGHT ARM 04/05/2013 2255   SPECREQUEST BOTTLES DRAWN AEROBIC ONLY 6CC 04/05/2013 2255   CULT  Value:        BLOOD CULTURE RECEIVED NO GROWTH TO DATE CULTURE WILL BE HELD FOR 5 DAYS BEFORE ISSUING A FINAL NEGATIVE REPORT Performed at [facility] 04/05/2013 2255   REPTSTATUS PENDING 04/05/2013 2255    Studies/Results: No results found.  Medications:  Scheduled Meds: . aspirin EC  81 mg Oral Daily  . carvedilol  25 mg Oral BID WC  . Chlorhexidine Gluconate Cloth  6 each Topical QHS  . feeding supplement (GLUCERNA SHAKE)  237 mL Oral TID BM  . furosemide  20 mg Oral Daily  . insulin aspart  0-9 Units Subcutaneous TID WC  . insulin glargine  10 Units Subcutaneous QHS  . iron polysaccharides  150 mg Oral Daily  . levETIRAcetam  500 mg Oral BID  . lisinopril  5 mg Oral Daily  . mupirocin ointment  1 application Nasal BID  . pantoprazole  20 mg Oral Daily  . potassium chloride  20 mEq Oral BID  . simvastatin  20 mg Oral Daily  . sodium chloride  3 mL Intravenous Q12H  . warfarin  7.5 mg Oral ONCE-1800  . Warfarin - Pharmacist Dosing Inpatient   Does not apply q1800   Continuous Infusions: . heparin 1,600 Units/hr (04/10/13 0527)   PRN Meds:.acetaminophen, acetaminophen, albuterol, docusate sodium  Antibiotics: Anti-infectives   Start     Dose/Rate Route Frequency Ordered Stop   04/06/13 1100  vancomycin (VANCOCIN) IVPB 750 mg/150 ml premix  Status:  Discontinued     750 mg 150 mL/hr over 60 Minutes Intravenous Every 12 hours 04/05/13 2242 04/06/13 0248   04/06/13 0500  piperacillin-tazobactam (ZOSYN) IVPB 3.375 g  Status:  Discontinued     3.375 g 12.5 mL/hr over 240 Minutes Intravenous Every 8 hours 04/05/13 2242 04/06/13 0248   04/05/13 2245   piperacillin-tazobactam (ZOSYN) IVPB 3.375 g     3.375 g 100 mL/hr over 30 Minutes Intravenous  Once 04/05/13 2234 04/05/13 2322   04/05/13 2245  vancomycin (VANCOCIN) IVPB 1000 mg/200 mL premix     1,000 mg 200 mL/hr over 60 Minutes Intravenous  Once 04/05/13 2234 04/05/13 2351     Antibiotics Given (last 72 hours)   None      Day of Hospitalization:  5 Consults:    Assessment/Plan:   Principal Problem:   Acute pulmonary embolus Active Problems:   DIABETES MELLITUS, TYPE II   Meningioma   Chronic systolic congestive heart failure   Risk for falls   Acute encephalopathy  #Acute pulmonary embolus -  VS stable; O2 sat 96% on RA; denies CP or SOB. 04/09/13 2D Echo reveals LVEF 60-65% with normal wall motion and grade 1 diastolic dysfunction.  RA/RV mildly dilated.  INR subtherapeutic.  Would  consider xarelto in this patient but d/t increased risk of falls and recent meningioma excision, may not be the best option.  -continue heparin gtt per pharmacy until INR therapeutic -continue coumadin per pharmacy  Lab Results  Component Value Date   INR 1.31 04/10/2013   INR 1.24 04/09/2013   INR 1.19 04/08/2013   #Acute encephalopathy - Resolved. -hold narcotics indefinitely -continue keppra for now and monitor clinically; follow-up with neurosurgery for continued use of keppra given side effect profile -PT/OT  #Microcytic Anemia: No obvious s/s of bleeding, Baseline Hb ~10. Pt has thrombocytosis.  History of mild antral gastritis in 2012. 2004 Colonoscopy revealed diverticulosis.  Pt iron is low, ferritin wnl but is an acute phase reactant so most likely false normal.  Pt given iron infusion during last admission and was started on niferex 150mg  daily.  FOBT was negative.  Lab Results  Component Value Date   IRON <10* 04/04/2013   TIBC NOT CALC 04/04/2013   FERRITIN 208 04/04/2013   -CBC in AM -Continue to monitor if hgb<7 transfuse -Cont home PPI  -niferex 150mg  daily -colace  100mg  bid -consider GI consult  #Uncontrolled DMII: Most recent A1c 9.7 on 03/28/13. Home regimen includes lantus 25u qHS and SSI aspart.  -carb modified diet -SSI, sensitive  -lantus 10units qhs   CBG (last 3)   Recent Labs  04/09/13 1728 04/09/13 2137 04/10/13 0802  GLUCAP 140* 170* 114*   #HTN: Normotensive at admission; goal<140/90.  Home regimen includes amlodipine 10, coreg 25 bid, lasix 20.   -continue coreg, lasix; would not continue amlodipine as ACE-i would be a better option and increased compliance -restart lisinopril 5mg  daily; may need to titrate up -d/c amlodipine as not needed for BP control; can titrate up lisinopril as needed  #Diastolic dysfunction grade 1: Myoview from 12/12/10 revealed EF 40% with diffuse hyopkinesis & abnl septal motion.  04/09/13 2D echo reveals LVEF 60-65%.  -Appears euvolemic on exam, continue carvedilol -daily weights  -restart lisinopril today   #Seizure disorder: due to meningioma, now s/p excision by craniotomy  -Continue Keppra 500 bid -follow-up with neurosurgery if continuing keppra indefintely  #H/o CVA: Nonfocal exam, continue ASA 81 & statin. Pt may have underlying vascular dementia that is contributing to her periods of forgetfulness.  -monitor  #Persistent hematuria: Pt has been referred to urology in the past for chronic bladder outlet obstruction but has not gone.  May be related to UTI but pt does have a h/o smoking. Should consider follow up with urology as outpatient.  -monitor   #VTE ppx: on heparin gtt and coumadin   #Code Status : full   #Dispo- Deferred.    LOS: 5 days   Michail Jewels, MD PGY-1, Internal Medicine  [phone] (7AM-5PM Mon-Fri) 04/10/2013, 10:00 AM

## 2013-04-10 NOTE — Discharge Summary (Signed)
Agree with plan as outlined in summary.  

## 2013-04-10 NOTE — Progress Notes (Addendum)
ANTICOAGULATION CONSULT NOTE - Follow Up Consult  Pharmacy Consult for Heparin/Coumadin Indication: pulmonary embolus  No Known Allergies  Patient Measurements: Height: 5\' 4"  (162.6 cm) Weight: 190 lb 14.7 oz (86.6 kg) IBW/kg (Calculated) : 54.7 Heparin Dosing Weight: 73.5  Vital Signs: Temp: 98 F (36.7 C) (02/23 0520) Temp src: Oral (02/23 0520) BP: 157/80 mmHg (02/23 0520) Pulse Rate: 78 (02/23 0520)  Labs:  Recent Labs  04/08/13 0634 04/09/13 0317 04/10/13 0646  HGB 7.8* 7.4* 7.7*  HCT 25.4* 24.1* 25.3*  PLT 669* 663* 665*  LABPROT 14.8 15.3* 16.0*  INR 1.19 1.24 1.31  HEPARINUNFRC 0.58 0.58 0.78*  CREATININE  --  0.94 0.87    Estimated Creatinine Clearance: 57.7 ml/min (by C-G formula based on Cr of 0.87).   Assessment: 80yo female discharged 2/18, returns for SOB and lethargy. CT reveals PE to begin Coumadin with Heparin bridging. Noted anemia and chronic hematuria. Pt had brain surgery 31mo ago for meningioma.  - PMH: DM, HLD, HTN, GERD, CVA, diverticulitis, OA, CHF, meningioma, depression, lung ca  Problems: Acute PE, Acute encephalopathy, UTI, Cx syst CHF, Meningioma, DM II, Lung Ca  Anticoagulation: PE, Day #5 overlap. Heparin level 0.78. INR 1.31. Hgb 7.7 low but stable. Spoke with Dr. Gordy Levan who would like to transition pt to [facility] for discharge.   Infectious Disease: Tmax 99.4. WBC 13.9 up. BC x 2 pending. UTI  Cardiovascular: CHF. 157/80. Meds: Norvasc10, ASA81, Coreg, po Lasix, K+, Zocor  Endocrinology: SSI, Lantus with CBGs 114-208  Gastrointestinal / Nutrition: po PPI  Neurology: Keppra. Meningioma s/p brain surgery. Acute encephalopathy resolved.  Nephrology: Scr 0.87  Pulmonary  Hematology / Oncology: Anemic on Niferex  PTA Medication Issues  Best Practices  Goal of Therapy:  Heparin level 0.3-0.7 units/ml INR 2-3 Monitor platelets by anticoagulation protocol: Yes   Plan:  Repeat Coumadin 7.5mg  po x 1 tonight. D/c Heparin and  start Lovenox 1.5mg /kg/day=130mg /24h  Leighann Amadon S. Alford Highland, PharmD, BCPS Clinical Staff Pharmacist Pager [phone]  Eilene Ghazi Stillinger 04/10/2013,9:47 AM

## 2013-04-10 NOTE — Discharge Summary (Signed)
Name: Charnell Peplinski MRN: [id number] DOB: [birth year] 80 y.o. PCP: Jerene Pitch, MD . Date of Admission: 04/05/2013 10:09 PM Date of Discharge: 04/10/2013 Attending Physician: Dr. Thayer Headings, MD  Discharge Diagnosis: Principal Problem:   Acute pulmonary embolus Active Problems:   DIABETES MELLITUS, TYPE II   Meningioma   Chronic systolic congestive heart failure   Risk for falls   Acute encephalopathy  Discharge Medications:   Medication List    STOP taking these medications       amLODipine 10 MG tablet  Commonly known as:  NORVASC      TAKE these medications       ACCU-CHEK AVIVA PLUS W/DEVICE Kit  1 each by Does not apply route 3 (three) times daily. dx code 250.00 insulin requiring     accu-chek softclix lancets  Check blood sugar 3 times a day before meals dx code 250.00 insulin requiring     albuterol (2.5 MG/3ML) 0.083% nebulizer solution  Commonly known as:  PROVENTIL  Take 3 mLs (2.5 mg total) by nebulization every 6 (six) hours as needed for wheezing.     albuterol 108 (90 BASE) MCG/ACT inhaler  Commonly known as:  PROVENTIL HFA;VENTOLIN HFA  Inhale 2 puffs into the lungs every 4 (four) hours as needed for wheezing.     aspirin 81 MG tablet  Take 81 mg by mouth daily.     carvedilol 25 MG tablet  Commonly known as:  COREG  Take 1 tablet (25 mg total) by mouth 2 (two) times daily with a meal.     [employer] LANCETS MICRO THIN 33G Misc  Check blood sugar 3 times a day before meals dx code 250.00 insulin requiring     docusate sodium 100 MG capsule  Commonly known as:  COLACE  Take 1 capsule (100 mg total) by mouth daily as needed.     enoxaparin 100 MG/ML injection  Commonly known as:  LOVENOX  Inject 1.3 mLs (130 mg total) into the skin daily. for 5 days     furosemide 20 MG tablet  Commonly known as:  LASIX  Take 1 tablet (20 mg total) by mouth daily.     glucose blood test strip  Commonly known as:  ACCU-CHEK AVIVA PLUS  Check blood  sugar 3 times a day before meals dx code 250.00 insulin requiring     insulin aspart 100 UNIT/ML FlexPen  Commonly known as:  NOVOLOG FLEXPEN  - Take three times with meals   - <70    2 units if going to eat- skip if going to skip meal   -  71-140    4 units Novolog   -  141-210  6 units Novolog   -  211-280  8 units     Insulin Glargine 100 UNIT/ML Solostar Pen  Commonly known as:  LANTUS SOLOSTAR  Inject 25 Units into the skin at bedtime.     iron polysaccharides 150 MG capsule  Commonly known as:  NIFEREX  Take 1 capsule (150 mg total) by mouth daily.     levETIRAcetam 500 MG tablet  Commonly known as:  KEPPRA  Take 1 tablet (500 mg total) by mouth 2 (two) times daily.     lisinopril 5 MG tablet  Commonly known as:  PRINIVIL,ZESTRIL  Take 1 tablet (5 mg total) by mouth daily.     pantoprazole 20 MG tablet  Commonly known as:  PROTONIX  Take 20 mg by mouth daily.  simvastatin 20 MG tablet  Commonly known as:  ZOCOR  Take 20 mg by mouth daily.     triamcinolone ointment 0.1 %  Commonly known as:  KENALOG  Apply 1 application topically daily as needed (for skin irritaion).     warfarin 7.5 MG tablet  Commonly known as:  COUMADIN  Take 1 tablet by mouth at 6PM daily starting today, 04/10/13 and 04/11/13.  See Dr. Elie Confer on Wednesday 2/25 for INR check.        Disposition and follow-up:   Ms.Mikiyah Graffius was discharged from [facility] in Stable condition.  Please address the following problems:  1. Continued shortness of breath  2. Pt needs INR check; compliance with lovenox    Labs / imaging needed at time of follow-up: INR  Pending labs/ test needing follow-up: None  Follow-up Appointments: Follow-up Information   Follow up with Ivor Costa, MD On 04/12/2013. (9:00AM )    Specialty:  Internal Medicine   Contact information:   [place] [address] [phone]       Discharge Instructions: Discharge Orders    Future Appointments Provider Department Dept Phone   04/17/2013 3:15 PM Joni Reining, [place] [facility] [place] [phone]   05/03/2013 11:20 AM Zenovia Jarred, MD [facility], [place] [phone]   Future Orders Complete By Expires   Call MD for:  difficulty breathing, headache or visual disturbances  As directed    Call MD for:  extreme fatigue  As directed    Call MD for:  temperature >100.4  As directed    Diet - low sodium heart healthy  As directed    Increase activity slowly  As directed       Consultations:  None  Procedures Performed:  Dg Chest 2 View  04/03/2013   CLINICAL DATA:  ALTERED MENTAL STATUS, RIGHT LUNG CANCER  EXAM: CHEST  2 VIEW  COMPARISON:  DG CHEST 1V PORT dated 03/02/2013; CT CHEST W/CM dated 05/07/2012  FINDINGS: MILDLY ENLARGED CARDIAC SILHOUETTE. THERE IS ATELECTASIS IN THE RIGHT MID LUNG UNCHANGED FROM PRIOR. NO FOCAL CONSOLIDATION. NO PNEUMOTHORAX. NO PLEURAL FLUID.  IMPRESSION: 1. No clear acute findings. 2. Right lung atelectasis.   Electronically Signed   By: Suzy Bouchard M.D.   On: 04/03/2013 01:45   Ct Head Wo Contrast  04/03/2013   CLINICAL DATA:  Altered mental status  EXAM: CT HEAD WITHOUT CONTRAST  TECHNIQUE: Contiguous axial images were obtained from the base of the skull through the vertex without intravenous contrast.  COMPARISON:  Prior MRI from 02/26/2013  FINDINGS: Postoperative changes from recent right frontal craniotomy with resection of large right frontal meningioma again seen. A hypodense extra-axial fluid collection measuring 1 cm in diameter overlies the operative site subjacent to the craniotomy bone flap. A few scattered foci of pneumocephalus persists within this region. There is encephalomalacia within the subjacent right frontal lobe. Small linear hyperdensity within the lateral right frontal lobe is likely related to beam hardening artifact from overlying postsurgical changes at the anterior cranial vault.  Previously seen right-to-left midline shift has resolved. Ventricles are stable without evidence of hydrocephalus.  Remote right frontal and right basal ganglia infarcts again noted. No new large vessel territory infarct. No acute intracranial hemorrhage.  No acute abnormality seen within the calvarium. Orbits are normal. Paranasal sinuses and mastoid air cells are clear.  IMPRESSION: 1. Postoperative changes from recent right frontal craniotomy with resection of previously identified right frontal  meningioma. 2. Hypodense subdural fluid collection measuring 1 cm in maximal diameter subjacent to the craniotomy bone flap overlying the right frontal lobe. This collection is within normal limits for normal expected postoperative changes. 3. No acute intracranial hemorrhage or infarct identified. 4. Remote right frontal and right basal ganglia lacunar infarcts.   Electronically Signed   By: Jeannine Boga M.D.   On: 04/03/2013 02:48   Ct Angio Chest Pe W/cm &/or Wo Cm  04/06/2013   CLINICAL DATA:  Hypoxia.  Shortness of breath.  EXAM: CT ANGIOGRAPHY CHEST WITH CONTRAST  TECHNIQUE: Multidetector CT imaging of the chest was performed using the standard protocol during bolus administration of intravenous contrast. Multiplanar CT image reconstructions and MIPs were obtained to evaluate the vascular anatomy.  CONTRAST:  142m OMNIPAQUE IOHEXOL 350 MG/ML SOLN  COMPARISON:  05/07/2012  FINDINGS: There are filling defects within the right middle and lower lobe pulmonary arteries compatible with moderately large pulmonary emboli. Smaller emboli noted in the posterior right lower lobe. No emboli on the left. There are trace bilateral pleural effusions. Dependent atelectasis in the left lung. Diffuse ground-glass opacities throughout much of the left lung and to a lesser extent right lower lobe. This may reflect mild edema, atelectasis or alveolitis.  Heart is mildly enlarged. The right ventricle to left ventricle ratio  is 1.06 (normal less than 0.9) suggesting a component of right heart strain.  No mediastinal, hilar, or axillary adenopathy. Chest wall soft tissues are unremarkable.  Imaging into the upper abdomen shows no acute findings. Will large low-density lesion within the right hepatic lobe is again noted which was shown on prior CT to be most compatible with hemangioma.  Review of the MIP images confirms the above findings.  IMPRESSION: Moderate middle lobe and lower lobe pulmonary emboli.  A component of mild right heart strain is suspected with a RV to LV ratio of 1.06.  Trace bilateral pleural effusions. Dependent left-sided atelectasis.  Ground-glass opacities throughout the left lung and to a lesser extent right lower lobe may reflect edema, atelectasis or alveolitis.  Critical Value/emergent results were called by telephone at the time of interpretation on 04/06/2013 at 2:25 AM to Dr. RNaaman Plummer who verbally acknowledged these results.   Electronically Signed   By: KRolm BaptiseM.D.   On: 04/06/2013 02:25   Dg Chest Port 1 View  (if Code Sepsis Called)  04/05/2013   CLINICAL DATA:  Sepsis.  Altered mental status.  EXAM: PORTABLE CHEST - 1 VIEW  COMPARISON:  DG CHEST 2 VIEW dated 04/03/2013; DG CHEST 1V PORT dated 02/25/2013; CT CHEST W/CM dated 05/07/2012  FINDINGS: Heart is borderline enlarged. Mild vascular congestion. Vague opacity throughout much of the left lung. Cannot exclude infiltrate/pneumonia. Patchy right perihilar opacity also noted. No effusions. No acute bony abnormality.  IMPRESSION: Vague diffuse opacities throughout the left lung and in the right perihilar region. Cannot exclude infection. Asymmetric edema possible blood fell less likely.  Mild cardiomegaly.   Electronically Signed   By: KRolm BaptiseM.D.   On: 04/05/2013 23:09    2D Echo: N/A  Cardiac Cath: N/A  Admission HPI: [name] Alonzois a 80year-old woman with past medical history of CHF (EF 40%), insulin-dependent Type II DM,  meningioma resection 1/15, recently discharged 2/18, presenting with hypoxia and AMS. Per daughter, on the afternoon of admission, the patient walked up 3 steps to her apartment, and became significantly short of breath, having to sit to catch her breath. For the remainder  of the day, the patient's family notes observing "heavy breathing" and fatigue, with a decreased level of interaction with the family; an abrupt change from earlier that day. EMS was called, and found the patient to be hypoxic to the 70's and somnolent, reportedly with immediate improvement in mental status with oxygen. On evaluation in the ED, the patient is able to respond to commands, and able to give simple 1-word answers, but unable to recount the day's events or hold a conversation. The patient notes no chest pain, cough, or lightheadedness.  Of note, the patient was recently hospitalized 2/16-2/18 with AMS, thought to be due to medication side effect vs ?UTI, and was discharged on the morning of admission (2/18) with mental status at baseline per patient's family. The patient's O2 sat was 92% on admission 2/16 on room air, and the patient was intermittently on O2 by [place] during that hospitalization, though interestingly no desaturations were noted during that hospitalization (unclear why patient was placed on O2 at that time).    Hospital Course by problem list: Principal Problem:   Acute pulmonary embolus Active Problems:   DIABETES MELLITUS, TYPE II   Meningioma   Chronic systolic congestive heart failure   Risk for falls   Acute encephalopathy   #Acute pulmonary embolus - The patient presented with acute onset of hypoxia (PaO2 41 and SpO2 77%) requiring supplemental oxygen (5L), mild tachycardia (102), and fever (101.6) likely triggered by DVT (+ Homan's signs) in setting of recent surgery, hospitalization (10 days in January and recently for 2 days), and immobilization. CTA chest revealed moderate middle lobe and lower lobe  pulmonary emboli. Also with component of mild right heart strain with a RV to LV ratio of 1.06. In the ED, concern was raised for HCAP vs acute CHF and pt received IV vancomycin and zosyn for HCAP. Anticoagulation therapy was discussed with her neurosurgeon Dr. Christella Noa given recent meningioma resection on 03/02/13 who agreed with heparin and coumadin administration for acute PE which were started.  VS remained stable throughtout admission.  O2 sat 96% on RA; denies CP or SOB. 04/09/13 2D Echo reveals LVEF 60-65% with normal wall motion and grade 1 diastolic dysfunction. RA/RV mildly dilated. INR remained subtherapeutic. Would consider xarelto in this patient but d/t increased risk of falls and recent meningioma excision, may not be the best option.  We ultimately decided to d/c on lovenox x 5 days and continue 7.37m coumadin to follow up with [facility] 2[facility]25 [facility]  Explained extensively to the daughter (Ms. CMaisie Fus the importance of getting this INR checked [facility]  She voiced understanding.  Lab Results   Component  Value  Date    INR  1.31  04/10/2013    INR  1.24  04/09/2013    INR  1.19  04/08/2013    #Acute encephalopathy - Resolved.  -hold narcotics indefinitely  -continue keppra for now and monitor clinically; follow-up with neurosurgery for continued use of keppra given side effect profile  -Pt got PT/OT   #Microcytic Anemia: No obvious s/s of bleeding, Baseline Hb ~10. Pt has thrombocytosis. History of mild antral gastritis in 2012. 2004 Colonoscopy revealed diverticulosis. Pt iron is low, ferritin wnl but is an acute phase reactant so most likely false normal. Pt given iron infusion during last admission and was started on niferex 1536mdaily. FOBT was negative.  Lab Results   Component  Value  Date    IRON  <  10*  04/04/2013    TIBC  NOT CALC  04/04/2013    FERRITIN  208  04/04/2013    -Continue to monitor if hgb<7 transfuse  -Cont home  PPI  -niferex 174m daily  -colace 1066mbid  -consider GI consult as outpatient  #Uncontrolled DMII: Most recent A1c 9.7 on 03/28/13. Home regimen includes lantus 25u qHS and SSI aspart.  -carb modified diet  -SSI, sensitive  -lantus 10units qhs  CBG (last 3)    04/09/13 1728  04/09/13 2137  04/10/13 0802   GLUCAP  140*  170*  114*    #HTN: Normotensive at admission; goal<140/90. Home regimen includes amlodipine 10, coreg 25 bid, lasix 20.  -continue coreg, lasix; would not continue amlodipine as ACE-i would be a better option and increased compliance  -restart lisinopril 74m74maily; may need to titrate up  -d/c amlodipine as not needed for BP control; can titrate up lisinopril as needed   #Diastolic dysfunction grade 1: Myoview from 12/12/10 revealed EF 40% with diffuse hyopkinesis & abnl septal motion. 04/09/13 2D echo reveals LVEF 60-65%.  -Appears euvolemic on exam, continue carvedilol  -daily weights  -restart lisinopril   #Seizure disorder: due to meningioma, now s/p excision by craniotomy  -Continue Keppra 500 bid  -follow-up with neurosurgery if continuing keppra indefintely   #H/o CVA: Nonfocal exam, continue ASA 81 & statin. Pt may have underlying vascular dementia that is contributing to her periods of forgetfulness.  -monitor   #Persistent hematuria: Pt has been referred to urology in the past for chronic bladder outlet obstruction but has not gone. May be related to UTI but pt does have a h/o smoking. Should consider follow up with urology as outpatient.  -monitor   #VTE ppx: on heparin gtt and coumadin   #Code Status : full    Discharge Vitals:   BP 157/80  Pulse 78  Temp(Src) 98 F (36.7 C) (Oral)  Resp 20  Ht '5\' 4"'  (1.626 m)  Wt 190 lb 14.7 oz (86.6 kg)  BMI 32.75 kg/m2  SpO2 93%  Discharge Labs:  No results found for this or any previous visit (from the past 24 hour(s)).  Signed: JacMichail JewelsD [id number]/2015, 5:54 PM   Time  Spent on Discharge: 474m53mes Services Ordered on Discharge: None Equipment Ordered on Discharge: None

## 2013-04-10 NOTE — Discharge Instructions (Signed)
Please return to the internal medicine clinic on 04/12/13 at [facility] or sooner if needed.    Please see Dr. Elie Confer in the internal medicine clinic on Wednesday, 04/12/13 to have your INR checked.   Please take coumadin 7.5mg  daily at [facility] until you see Dr. Elie Confer on Wednesday, 04/12/13.  Please inject your lovenox 130mg  x 5 days at the same time every day.  Your current medical regimen is effective;  continue present plan and all medications. Please be sure to bring all of your medications with you to every visit.  Should you have any new or worsening symptoms, please be sure to call the clinic at [phone].  If you believe that you are suffering from a life threatening condition or one that may result in the loss of limb or function, then you should call 911 or proceed to the nearest Emergency Department.

## 2013-04-11 NOTE — Telephone Encounter (Signed)
Pt was seen in clinic 03/28/13 by Dr [name].

## 2013-04-12 ENCOUNTER — Ambulatory Visit: Payer: Medicare Other | Admitting: Internal Medicine

## 2013-04-12 LAB — CULTURE, BLOOD (ROUTINE X 2)
CULTURE: NO GROWTH
Culture: NO GROWTH

## 2013-04-14 ENCOUNTER — Ambulatory Visit: Payer: Medicare Other | Admitting: Internal Medicine

## 2013-04-17 ENCOUNTER — Ambulatory Visit: Payer: Medicare Other | Admitting: Internal Medicine

## 2013-04-17 NOTE — Discharge Summary (Signed)
I agree with summary as outlined below

## 2013-04-18 ENCOUNTER — Ambulatory Visit (INDEPENDENT_AMBULATORY_CARE_PROVIDER_SITE_OTHER): Payer: Medicare Other | Admitting: Pharmacist

## 2013-04-18 ENCOUNTER — Encounter: Payer: Self-pay | Admitting: Internal Medicine

## 2013-04-18 ENCOUNTER — Ambulatory Visit (INDEPENDENT_AMBULATORY_CARE_PROVIDER_SITE_OTHER): Payer: Medicare Other | Admitting: Internal Medicine

## 2013-04-18 VITALS — BP 110/63 | HR 91 | Temp 97.7°F | Ht 64.0 in

## 2013-04-18 DIAGNOSIS — E119 Type 2 diabetes mellitus without complications: Secondary | ICD-10-CM | POA: Diagnosis not present

## 2013-04-18 DIAGNOSIS — D539 Nutritional anemia, unspecified: Secondary | ICD-10-CM

## 2013-04-18 DIAGNOSIS — D509 Iron deficiency anemia, unspecified: Secondary | ICD-10-CM

## 2013-04-18 DIAGNOSIS — I2699 Other pulmonary embolism without acute cor pulmonale: Secondary | ICD-10-CM

## 2013-04-18 DIAGNOSIS — D649 Anemia, unspecified: Secondary | ICD-10-CM | POA: Insufficient documentation

## 2013-04-18 DIAGNOSIS — N320 Bladder-neck obstruction: Secondary | ICD-10-CM

## 2013-04-18 DIAGNOSIS — Z7901 Long term (current) use of anticoagulants: Secondary | ICD-10-CM | POA: Insufficient documentation

## 2013-04-18 LAB — RETICULOCYTES
ABS Retic: 59 10*3/uL (ref 19.0–186.0)
RBC.: 3.69 MIL/uL — AB (ref 3.87–5.11)
Retic Ct Pct: 1.6 % (ref 0.4–2.3)

## 2013-04-18 LAB — CBC
HCT: 29.9 % — ABNORMAL LOW (ref 36.0–46.0)
HEMOGLOBIN: 9.2 g/dL — AB (ref 12.0–15.0)
MCH: 24.9 pg — AB (ref 26.0–34.0)
MCHC: 30.8 g/dL (ref 30.0–36.0)
MCV: 81 fL (ref 78.0–100.0)
Platelets: 343 10*3/uL (ref 150–400)
RBC: 3.69 MIL/uL — AB (ref 3.87–5.11)
RDW: 17.8 % — ABNORMAL HIGH (ref 11.5–15.5)
WBC: 6.3 10*3/uL (ref 4.0–10.5)

## 2013-04-18 LAB — POCT INR: INR: 1.2

## 2013-04-18 LAB — HM DIABETES EYE EXAM

## 2013-04-18 NOTE — Progress Notes (Signed)
Anti-Coagulation Progress Note  [name] is a 80 y.o. female who is currently on an anti-coagulation regimen.    RECENT RESULTS: Recent results are below, the most recent result is correlated with a dose of ZERO mg per day:  She had run out of warfarin and has not been taking for past 6 days. She has been off of Lovenox for 3 days. She has been provided Lovenox syringes 120mg  #7 (dose = 1.39mg /kg---as close as could get and still dispense a commercially available syringe strength to make this as simple as possible for daughter). Daughter was instructed on injection technique and demonstrated/provided first inject today at [facility] to her mother. All subsequent Lovenox doses of 120mg  will be administered at [facility] each day (every 24h) and warfarin will be administered ONCE DAILY at 7.5mg  QD x 3 days; then 5mg  QD until seen by me in [facility] on Monday. Patient was provided all instructions regarding RTC or RT ED if she were to become dyspneic, or have hemoptysis, chest pain, etc. Lovenox syringes were provided. Warfarin 5mg  strength tablets #20 were provided with instructions on how to get 1 & 1/2 tablets to equal her 7.5mg  dose for the 3 consecutive days before changing to 5mg  warfarin daily.  Lab Results  Component Value Date   INR 1.20 04/18/2013   INR 1.31 04/10/2013   INR 1.24 04/09/2013    ANTI-COAG DOSE: Anticoagulation Dose Instructions as of 04/18/2013     Dorene Grebe Tue Wed Thu Fri Sat   New Dose 5 mg 0 mg 7.5 mg 7.5 mg 7.5 mg 5 mg 5 mg       ANTICOAG SUMMARY: Anticoagulation Episode Summary   Current INR goal 2.0-3.0  Next INR check 04/24/2013  INR from last check 1.20! (04/18/2013)  Weekly max dose   Target end date 04/05/2014  [facility] check [facility]  Preferred lab   Send INR reminders to    Indications  Acute pulmonary embolus [415.19] Long term (current) use of anticoagulants [V58.61]        Comments         ANTICOAG TODAY: Anticoagulation Summary as of 04/18/2013   INR  goal 2.0-3.0  Selected INR 1.20! (04/18/2013)  Next INR check 04/24/2013  Target end date 04/05/2014   Indications  Acute pulmonary embolus [415.19] Long term (current) use of anticoagulants [V58.61]      Anticoagulation Episode Summary   [facility]   Preferred lab    Send INR reminders to    Comments       PATIENT INSTRUCTIONS: Patient Instructions  Patient instructed to take medications as defined in the Anti-coagulation Track section of this encounter.  Patient instructed to TAKE today's dose.  Patient's daughter was instructed on how to inject Lovenox 120mg  SQ every 24h at level of waistband--2 inches minimally from the "belly-button", rotating sites every other day. Continue until patient sees me next.  Patient verbalized understanding of these instructions.       FOLLOW-UP Return in 6 days (on 04/24/2013) for Follow up INR at 3:30PM.  Jorene Guest, III Pharm.D., CACP

## 2013-04-18 NOTE — Assessment & Plan Note (Signed)
Patient's PE would be considered provoked by her recent surgery, she should remain on A/C for 3 months.  She was resumed on lovenox injections and warfarin as instructed by Dr. Elie Confer.  She is to follow up with him on Monday for repeat INR.  She is not currently hypoxic or have symptoms of chest pain or SOB.

## 2013-04-18 NOTE — Patient Instructions (Signed)
Please seen [name] for your eye exam. I will call with lab results.

## 2013-04-18 NOTE — Patient Instructions (Signed)
Patient instructed to take medications as defined in the Anti-coagulation Track section of this encounter.  Patient instructed to TAKE today's dose.  Patient's daughter was instructed on how to inject Lovenox 120mg  SQ every 24h at level of waistband--2 inches minimally from the "belly-button", rotating sites every other day. Continue until patient sees me next.  Patient verbalized understanding of these instructions.

## 2013-04-18 NOTE — Progress Notes (Signed)
[facility]  Subjective:   Patient ID: [name] female   DOB: 17-Feb-1934 80 y.o.   MRN: [id number]  HPI: [name] is a 80 y.o. female with an extensive PMH as documented below.  This history includes HTN, DM, CVA, CHF, meningioma s/p craniotomy with tumor excision (03/02/13).  She subsequently presented to the ED on 2/16 due to altered mental status.  Initially there was concern this was due to an infectious etiology (UTI vs meningitis/encephalitis) vs multiple narcotic medications vs further seizure activity.  Workup at that time did not reveal the cause and her encephalopathy resolved (thought likely due to medications).  Hours after discharge on 2/18 she was readmitted to the hospital due to acute hypoxia and was found to have multiple pulmonary embolisms with some evidence of right heart strain.  She was started on heparin and begun to bridge to coumadin, she was given Lovenox injections on discharge as her INR was still subtheraputic.  She was unable to be seen at her scheduled follow up time as our clinic was closed due to snow.  She has run out of lovenox and warfarin, she has already seen Dr. Elie Confer in our clinic today and given additional lovenox supplies and renewed warfarin prescription. Today she presents for HFU. She is accompanied by her daughter, they both report she has been doing very well since discharge and has no acute complaints.  Her daughter does note that her mental status has been much clearer since stopping the narcotic pain medications but that she occasionally complains of some low back pain, she does not endorse any back pain at this time.    Past Medical History  Diagnosis Date  . Diabetes mellitus 2007    HgA1C (02/20/2010) = 9.2, HgA1C (03/20/2009) = 12.1  . CVA (cerebrovascular accident) 6812     right embolic stroke, no residual deficits  . Diverticulitis   . CVA (cerebral infarction) 7-yrs ago  . Arthritis   . Dysphagia   . CHF (congestive  heart failure)   . DIABETES MELLITUS, TYPE II 12/04/2005  . HYPERLIPIDEMIA 12/04/2005  . HYPERTENSION 12/04/2005  . GERD 12/04/2005  . ARTHRITIS, KNEE 03/24/2006  . Lung mass 07/08/2010  . Meningioma 07/21/2010  . Hemangioma of liver 12/02/2010  . Depression 12/02/2010  . Adenocarcinoma of lung      Right upper lobe adenocarcinoma. s/p right lower lobectomy 12/15/10   Current Outpatient Prescriptions  Medication Sig Dispense Refill  . albuterol (PROVENTIL HFA;VENTOLIN HFA) 108 (90 BASE) MCG/ACT inhaler Inhale 2 puffs into the lungs every 4 (four) hours as needed for wheezing.  3 Inhaler  1  . albuterol (PROVENTIL) (2.5 MG/3ML) 0.083% nebulizer solution Take 3 mLs (2.5 mg total) by nebulization every 6 (six) hours as needed for wheezing.  75 mL  12  . aspirin 81 MG tablet Take 81 mg by mouth daily.       . Blood Glucose Monitoring Suppl (ACCU-CHEK AVIVA PLUS) W/DEVICE KIT 1 each by Does not apply route 3 (three) times daily. dx code 250.00 insulin requiring  1 kit  1  . carvedilol (COREG) 25 MG tablet Take 1 tablet (25 mg total) by mouth 2 (two) times daily with a meal.  180 tablet  4  . [facility] LANCETS MICRO THIN 33G MISC Check blood sugar 3 times a day before meals dx code 250.00 insulin requiring  100 each  12  . docusate sodium (COLACE) 100 MG capsule Take 1 capsule (100 mg total) by mouth daily  as needed.  30 capsule  1  . enoxaparin (LOVENOX) 100 MG/ML injection Inject 1.3 mLs (130 mg total) into the skin daily. for 5 days  20 mL  0  . furosemide (LASIX) 20 MG tablet Take 1 tablet (20 mg total) by mouth daily.  90 tablet  0  . glucose blood (ACCU-CHEK AVIVA PLUS) test strip Check blood sugar 3 times a day before meals dx code 250.00 insulin requiring  100 each  12  . insulin aspart (NOVOLOG FLEXPEN) 100 UNIT/ML FlexPen Take three times with meals  <70    2 units if going to eat- skip if going to skip meal   71-140    4 units Novolog   141-210  6 units Novolog   211-280  8 units  15 mL  11   . Insulin Glargine (LANTUS SOLOSTAR) 100 UNIT/ML Solostar Pen Inject 25 Units into the skin at bedtime.  10 mL  5  . iron polysaccharides (NIFEREX) 150 MG capsule Take 1 capsule (150 mg total) by mouth daily.  30 capsule  6  . Lancet Devices (ACCU-CHEK SOFTCLIX) lancets Check blood sugar 3 times a day before meals dx code 250.00 insulin requiring  100 each  12  . levETIRAcetam (KEPPRA) 500 MG tablet Take 1 tablet (500 mg total) by mouth 2 (two) times daily.  60 tablet  11  . lisinopril (PRINIVIL,ZESTRIL) 5 MG tablet Take 1 tablet (5 mg total) by mouth daily.  30 tablet  1  . pantoprazole (PROTONIX) 20 MG tablet Take 20 mg by mouth daily.      . simvastatin (ZOCOR) 20 MG tablet Take 20 mg by mouth daily.      Marland Kitchen triamcinolone ointment (KENALOG) 0.1 % Apply 1 application topically daily as needed (for skin irritaion).      . warfarin (COUMADIN) 7.5 MG tablet Take 1 tablet by mouth at 6PM daily starting today, 04/10/13 and 04/11/13.  See Dr. Elie Confer on Wednesday 2/25 for INR check.  3 tablet  0   No current facility-administered medications for this visit.   Family History  Problem Relation Age of Onset  . Hyperlipidemia Brother   . Hypertension Brother   . Diabetes Brother    History   Social History  . Marital Status: Widowed    Spouse Name: N/A    Number of Children: N/A  . Years of Education: N/A   Social History Main Topics  . Smoking status: Former Smoker    Types: Cigarettes    Quit date: 02/17/2000  . Smokeless tobacco: Never Used  . Alcohol Use: No  . Drug Use: No  . Sexual Activity: Not Currently   Other Topics Concern  . Not on file   Social History Narrative   Patient requests to use [employer] [facility] for her diabets testing supplies as of 09/05/2009.   Review of Systems: Review of Systems  Constitutional: Negative for fever, chills, weight loss and malaise/fatigue.  HENT: Negative for sore throat.   Eyes: Negative for blurred vision.  Respiratory: Negative for  cough, hemoptysis and shortness of breath.   Cardiovascular: Negative for chest pain and leg swelling.  Gastrointestinal: Negative for heartburn and abdominal pain.  Genitourinary: Negative for dysuria, frequency and hematuria.  Musculoskeletal: Negative for falls.  Skin: Negative for rash.  Neurological: Negative for dizziness, sensory change, focal weakness, weakness and headaches.    Objective:  Physical Exam: Filed Vitals:   04/18/13 1536  BP: 110/63  Pulse: 91  Temp: 97.7  F (36.5 C)  TempSrc: Oral  Height: _0  (1.626 m)  SpO2: 99%   Physical Exam  Nursing note and vitals reviewed. Constitutional: She is well-developed, well-nourished, and in no distress. No distress.  HENT:  Head: Normocephalic and atraumatic.  Eyes: EOM are normal. Pupils are equal, round, and reactive to light.  Cardiovascular: Normal rate, regular rhythm, normal heart sounds and intact distal pulses.   No murmur heard. Pulmonary/Chest: Effort normal and breath sounds normal. No respiratory distress. She has no wheezes. She has no rales.  Abdominal: Soft. Bowel sounds are normal. She exhibits no distension. There is no tenderness.  Skin: Skin is warm and dry. She is not diaphoretic.  Psychiatric: Affect normal.    Assessment & Plan:   See Problem Based Assessment and Plan No orders of the defined types were placed in this encounter.    Orders Placed This Encounter  Procedures  . CBC no Diff  . Reticulocytes Count

## 2013-04-18 NOTE — Progress Notes (Signed)
Case discussed with Dr. Hoffman soon after the resident saw the patient.  We reviewed the resident's history and exam and pertinent patient test results.  I agree with the assessment, diagnosis, and plan of care documented in the resident's note. 

## 2013-04-18 NOTE — Assessment & Plan Note (Signed)
Patient was noted to have an acute drop of her hemoglobin on this most recent admission.  Workup revealed a normal B12, folate, low iron, normal ferritin (possible false normal given acute illness) with a retic ct percent of 1.9.  This was thought to be Iron def anemia due to blood loss from her surgery.  She was treated with IV iron inpatient and has been taking Niferex.   - Will repeat CBC and retic count today to see if patient is improving on Iron therapy.

## 2013-04-18 NOTE — Assessment & Plan Note (Signed)
Patient missed appointment with Urology in January, this has been rescheduled today to 06/02/13.

## 2013-04-19 ENCOUNTER — Ambulatory Visit: Payer: Medicare Other | Admitting: Internal Medicine

## 2013-04-19 ENCOUNTER — Telehealth: Payer: Self-pay | Admitting: Dietician

## 2013-04-19 NOTE — Telephone Encounter (Signed)
Asked by Dr. [name] to follow up on blood sugars: spoke with patient's daughter who reports no problems and no sugns or symptoms of high or low blood sugars and that  her blood sugars are between 100 and 177 most of the time. Her lowest value was 95 one time, she gave her mother the 5 units according to the Novolog scale provided and her noon blood sugar was 177. Asked daughter to call us if her mother has any blood sugar < 90 or several > 300 and they are not coming down and they don;t know why they are high.     The last Novolog scale given to patient and daughter in January was using a safe correction factor of 70 mg/dl and corrected for low blood sugars.  Await meter download for review or A1C to assess.  <70 2 units if going to eat- skip Novolog if going to skip meal  71-140     4 units Novolog  141-210   6 units Novolog  211-280   8 units Novolog 281-350  10 units Novolog 351-420  12 units Novolog > 421     14 units and call office

## 2013-04-24 ENCOUNTER — Ambulatory Visit (INDEPENDENT_AMBULATORY_CARE_PROVIDER_SITE_OTHER): Payer: Medicare Other | Admitting: Pharmacist

## 2013-04-24 DIAGNOSIS — E119 Type 2 diabetes mellitus without complications: Secondary | ICD-10-CM | POA: Diagnosis not present

## 2013-04-24 DIAGNOSIS — I2699 Other pulmonary embolism without acute cor pulmonale: Secondary | ICD-10-CM

## 2013-04-24 DIAGNOSIS — I10 Essential (primary) hypertension: Secondary | ICD-10-CM | POA: Diagnosis not present

## 2013-04-24 DIAGNOSIS — D320 Benign neoplasm of cerebral meninges: Secondary | ICD-10-CM | POA: Diagnosis not present

## 2013-04-24 DIAGNOSIS — N390 Urinary tract infection, site not specified: Secondary | ICD-10-CM | POA: Diagnosis not present

## 2013-04-24 DIAGNOSIS — Z7901 Long term (current) use of anticoagulants: Secondary | ICD-10-CM | POA: Diagnosis not present

## 2013-04-25 ENCOUNTER — Telehealth: Payer: Self-pay | Admitting: Dietician

## 2013-04-25 LAB — POCT INR: INR: 2.2

## 2013-04-25 NOTE — Telephone Encounter (Signed)
[name] called to ask what insulin she should give her mother if her before meal CBG this am  is 104? says her mother's blood sugars have been 90-160 before meals, so according to her mealtime Novolog scale she has been giving her mother about 5 units three times a day and 25 units Lantus at bedtime( last dose last night) . This morning her mother awoke with a CG of 82 ( and no symptoms)  and her novolog scale doesn't tell her what to do below 91. CDE told [name] to omit Novolog mealtime insulin until lunch today and I would ask physician if she wants to adjust her mother's insulin/dose.  The Novolog premeal scale [name] is following is    91-160 give 5 units 161-230 give 7 units 231-300 give 9 units 301-370 give 11 units 371-440- ive 13 units 441-510 give 15 units > 510 give 17 units and call office

## 2013-04-25 NOTE — Telephone Encounter (Signed)
Do you think we should adjust the scale? Or lantus? i think morning cbg of 82 is pretty good.

## 2013-04-25 NOTE — Progress Notes (Signed)
Anti-Coagulation Progress Note  [name] is a 80 y.o. female who is currently on an anti-coagulation regimen.    RECENT RESULTS: Recent results are below, the most recent result is correlated with a dose of 37.5mg  over 6 days.  Lab Results  Component Value Date   INR 2.20 04/25/2013   INR 1.20 04/18/2013   INR 1.31 04/10/2013    ANTI-COAG DOSE: Anticoagulation Dose Instructions as of 04/24/2013     Sun Mon Tue Wed Thu Fri Sat   New Dose 7.5 mg 5 mg 7.5 mg 5 mg 7.5 mg 5 mg 7.5 mg       ANTICOAG SUMMARY: Anticoagulation Episode Summary   Current INR goal 2.0-3.0  Next INR check 05/08/2013  INR from last check 1.20! (04/18/2013)  Most recent INR 2.20 (04/25/2013)  Weekly max dose   Target end date 04/05/2014  [facility]  Preferred lab   Send INR reminders to    Indications  Pulmonary embolus [415.19] Long term (current) use of anticoagulants [V58.61]        Comments         ANTICOAG TODAY: Anticoagulation Summary as of 04/24/2013   INR goal 2.0-3.0  Selected INR 1.20! (04/18/2013)  Next INR check 05/08/2013  Target end date 04/05/2014   Indications  Pulmonary embolus [415.19] Long term (current) use of anticoagulants [V58.61]      Anticoagulation Episode Summary   [facility]   Preferred lab    Send INR reminders to    Comments       PATIENT INSTRUCTIONS: Patient Instructions  Patient instructed to take medications as defined in the Anti-coagulation Track section of this encounter.  Patient instructed to take today's dose.  Patient verbalized understanding of these instructions.       FOLLOW-UP Return in 2 weeks (on 05/08/2013) for Follow up INR at 3:45PM.  Jorene Guest, III Pharm.D., CACP

## 2013-04-25 NOTE — Patient Instructions (Signed)
Patient instructed to take medications as defined in the Anti-coagulation Track section of this encounter.  Patient instructed to take today's dose.  Patient verbalized understanding of these instructions.    

## 2013-04-26 NOTE — Telephone Encounter (Signed)
I suggest decreasing the lantus by 2-4 units to keep her blood sugars above 90mg /dl for safety. (age, length of time with diabetes and comorbitities).

## 2013-04-30 ENCOUNTER — Other Ambulatory Visit: Payer: Self-pay | Admitting: Internal Medicine

## 2013-05-01 NOTE — Telephone Encounter (Addendum)
Called and spoke to [name] about her mother's lantus dose. She reports her mom's blood sugars have been very well controlled at 113 yesterday am and 142 at bedtime, 131 this am, has not had  none less than 100 since last week. [name] verbalized understanding to new lantus dose of 21 units a day in the PM per Dr. Demetrio Lapping order. She also confirmed the Novolog scale  In this note is what she is following three times a day before meals.

## 2013-05-02 ENCOUNTER — Other Ambulatory Visit: Payer: Self-pay | Admitting: Internal Medicine

## 2013-05-02 DIAGNOSIS — E113299 Type 2 diabetes mellitus with mild nonproliferative diabetic retinopathy without macular edema, unspecified eye: Secondary | ICD-10-CM

## 2013-05-02 DIAGNOSIS — E119 Type 2 diabetes mellitus without complications: Secondary | ICD-10-CM

## 2013-05-02 MED ORDER — INSULIN GLARGINE 100 UNIT/ML SOLOSTAR PEN: 21.0000 [IU] | PEN_INJECTOR | Freq: Every day | SUBCUTANEOUS | Status: DC

## 2013-05-03 ENCOUNTER — Ambulatory Visit (INDEPENDENT_AMBULATORY_CARE_PROVIDER_SITE_OTHER): Payer: Medicare Other | Admitting: General Surgery

## 2013-05-03 ENCOUNTER — Telehealth: Payer: Self-pay | Admitting: Licensed Clinical Social Worker

## 2013-05-03 ENCOUNTER — Encounter (INDEPENDENT_AMBULATORY_CARE_PROVIDER_SITE_OTHER): Payer: Self-pay | Admitting: General Surgery

## 2013-05-03 VITALS — BP 142/78 | HR 68 | Temp 98.3°F | Resp 14 | Ht 70.0 in | Wt 197.2 lb

## 2013-05-03 DIAGNOSIS — L0231 Cutaneous abscess of buttock: Secondary | ICD-10-CM

## 2013-05-03 DIAGNOSIS — L03317 Cellulitis of buttock: Secondary | ICD-10-CM

## 2013-05-03 NOTE — Telephone Encounter (Signed)
CSW received request from [place] for [facility] for [name].  CSW placed call to Ms. Knebel and daughter answered telephone.  CSW inquired if pt was requesting PCS, daughter states yes.  PCS initiated and forwarded to physician that has seen Ms. Dahan most recent.

## 2013-05-03 NOTE — Progress Notes (Signed)
Subjective:     Patient ID: [name], female   DOB: Nov 13, 1933, 80 y.o.   MRN: [id number]  HPI Patient presented for follow up of buttock abscess status post previous I&D. According to her daughter, the wound has healed.  Review of Systems     Objective:   Physical Exam On physical exam, wound has completely healed. No evidence of ongoing infection.    Assessment:     Doing well status post incision and drainage of buttock abscess    Plan:     Return as needed

## 2013-05-08 ENCOUNTER — Ambulatory Visit: Payer: Medicare Other

## 2013-05-08 DIAGNOSIS — E11319 Type 2 diabetes mellitus with unspecified diabetic retinopathy without macular edema: Secondary | ICD-10-CM | POA: Diagnosis not present

## 2013-05-08 DIAGNOSIS — E1139 Type 2 diabetes mellitus with other diabetic ophthalmic complication: Secondary | ICD-10-CM | POA: Diagnosis not present

## 2013-05-10 ENCOUNTER — Other Ambulatory Visit (HOSPITAL_COMMUNITY): Payer: Self-pay | Admitting: Neurosurgery

## 2013-05-10 DIAGNOSIS — D329 Benign neoplasm of meninges, unspecified: Secondary | ICD-10-CM

## 2013-05-11 ENCOUNTER — Other Ambulatory Visit: Payer: Self-pay | Admitting: Internal Medicine

## 2013-05-11 DIAGNOSIS — I5022 Chronic systolic (congestive) heart failure: Secondary | ICD-10-CM

## 2013-05-16 ENCOUNTER — Telehealth: Payer: Self-pay | Admitting: Internal Medicine

## 2013-05-16 NOTE — Telephone Encounter (Signed)
  INTERNAL MEDICINE RESIDENCY PROGRAM After-Hours Telephone Call    Reason for call:   I placed an outgoing call to [name] at 830pm. Patient daughter reports that patient has had gradual onset B/L LE edema for one week. Denies SOB, DOE, orthopnea or PND. states that patient is doing fine otherwise. Denies long distance travel.   Of note, She has a history of chronic systolic HF with EF of 96-22% in 2012. And repeat Echo in Feb 2015 showed EF of 60-65% with grade 1 diastolic dysfunction.  She has been on lasix 20 mg po daily and reports medical compliance. She does not check her weight at home.     Last encounter / Pertinent Data:   Last seen in the clinic by Dr. Heber [place]. Patient is noted to have a history includes HTN, DM, CVA, CHF, meningioma s/p craniotomy with tumor excision (03/02/13). She was readmitted to the hospital due to multiple pulmonary embolisms with some evidence of right heart strain on 04/05/13. She is on coumadin with therapeutic INR of 2.2 on 04/25/13.      Assessment/ Plan:   Worsening LE edema without DOE, orthopnea or PND       This could represent worsening of HF given her history of chronic heart failure and right heart strain due to PE in Feb 2015. Since she denies DOE, orthopnea or PND and recent Echo showed good EF of 60-65% with grade 1 diastolic dysfunction, the likely etiology of her symptoms may be right heart failure.   I recommended patient to go to ED for a further evaluation tonight. However, she refused to go to the ED tonight despite the discussion about possible heart failure.  As always, pt is advised that if symptoms worsen or new symptoms arise, they should go to ER for further evaluation.  I will send a note to the front desk for an appt in am.     Charlann Lange, MD   05/16/2013, 8:56 PM

## 2013-05-22 ENCOUNTER — Ambulatory Visit: Payer: Medicare Other | Admitting: Pharmacist

## 2013-05-23 NOTE — Progress Notes (Signed)
I discussed the case with Dr. Elie Confer. Pt has been non compliant with her coumadin and has a subtherapeutic INR. I recommended starting Xarelto  15 mg 2 *day for 21 days and then changing to 20 mg 1*day. Pt to f/u in 1 month. Pt was seen by Dr. Elie Confer who gave patients instructions regarding Xarelto.

## 2013-05-24 ENCOUNTER — Ambulatory Visit (INDEPENDENT_AMBULATORY_CARE_PROVIDER_SITE_OTHER): Payer: Medicare Other | Admitting: Internal Medicine

## 2013-05-24 ENCOUNTER — Telehealth: Payer: Self-pay | Admitting: Pharmacist

## 2013-05-24 ENCOUNTER — Other Ambulatory Visit: Payer: Self-pay | Admitting: Internal Medicine

## 2013-05-24 ENCOUNTER — Telehealth: Payer: Self-pay | Admitting: *Deleted

## 2013-05-24 ENCOUNTER — Encounter: Payer: Self-pay | Admitting: Internal Medicine

## 2013-05-24 VITALS — BP 126/78 | HR 75 | Temp 98.8°F | Wt 199.8 lb

## 2013-05-24 DIAGNOSIS — R319 Hematuria, unspecified: Secondary | ICD-10-CM

## 2013-05-24 DIAGNOSIS — Z7901 Long term (current) use of anticoagulants: Secondary | ICD-10-CM

## 2013-05-24 DIAGNOSIS — R609 Edema, unspecified: Secondary | ICD-10-CM | POA: Diagnosis not present

## 2013-05-24 DIAGNOSIS — D509 Iron deficiency anemia, unspecified: Secondary | ICD-10-CM

## 2013-05-24 DIAGNOSIS — R600 Localized edema: Secondary | ICD-10-CM

## 2013-05-24 DIAGNOSIS — E119 Type 2 diabetes mellitus without complications: Secondary | ICD-10-CM | POA: Diagnosis not present

## 2013-05-24 LAB — URINALYSIS, MICROSCOPIC ONLY
CRYSTALS: NONE SEEN
Casts: NONE SEEN

## 2013-05-24 LAB — CBC WITH DIFFERENTIAL/PLATELET
BASOS ABS: 0 10*3/uL (ref 0.0–0.1)
Basophils Relative: 0 % (ref 0–1)
Eosinophils Absolute: 0.1 10*3/uL (ref 0.0–0.7)
Eosinophils Relative: 2 % (ref 0–5)
HCT: 30.2 % — ABNORMAL LOW (ref 36.0–46.0)
Hemoglobin: 9.3 g/dL — ABNORMAL LOW (ref 12.0–15.0)
LYMPHS ABS: 2.1 10*3/uL (ref 0.7–4.0)
LYMPHS PCT: 36 % (ref 12–46)
MCH: 25.3 pg — ABNORMAL LOW (ref 26.0–34.0)
MCHC: 30.8 g/dL (ref 30.0–36.0)
MCV: 82.1 fL (ref 78.0–100.0)
MONO ABS: 0.5 10*3/uL (ref 0.1–1.0)
Monocytes Relative: 9 % (ref 3–12)
NEUTROS ABS: 3.1 10*3/uL (ref 1.7–7.7)
Neutrophils Relative %: 53 % (ref 43–77)
Platelets: 245 10*3/uL (ref 150–400)
RBC: 3.68 MIL/uL — AB (ref 3.87–5.11)
RDW: 16.9 % — AB (ref 11.5–15.5)
WBC: 5.8 10*3/uL (ref 4.0–10.5)

## 2013-05-24 LAB — URINALYSIS, ROUTINE W REFLEX MICROSCOPIC
BILIRUBIN URINE: NEGATIVE
Glucose, UA: NEGATIVE mg/dL
Ketones, ur: NEGATIVE mg/dL
Nitrite: POSITIVE — AB
Protein, ur: NEGATIVE mg/dL
SPECIFIC GRAVITY, URINE: 1.016 (ref 1.005–1.030)
Urobilinogen, UA: 0.2 mg/dL (ref 0.0–1.0)
pH: 5.5 (ref 5.0–8.0)

## 2013-05-24 MED ORDER — RIVAROXABAN (XARELTO) VTE STARTER PACK (15 & 20 MG): ORAL_TABLET | ORAL | Status: DC

## 2013-05-24 NOTE — Telephone Encounter (Signed)
Agree with Dr. Murlean Caller for patient to be seen.  Thank you

## 2013-05-24 NOTE — Telephone Encounter (Signed)
Discussed with Dr. Elie Confer. Patient needs to come in to be evaluated.

## 2013-05-24 NOTE — Progress Notes (Signed)
Patient ID: [name], female   DOB: 12-04-1933, 80 y.o.   MRN: [id number]    Subjective:   Patient ID: [name] female   DOB: 1933/12/20 80 y.o.   MRN: [id number]  HPI: [name] is a 80 y.o. woman who complicated pmhx who presents with a a cc of blood on her panty liner. She changed her medicine from coumadin to xarelto yesterday. She states that there was a stain of about the size of a quarter. She notes some blood in her urine this morning as well. She denies recent hematuria or vaginal discharge. She denies fevers, chills, nausea, vomitting. She denies H/A dizziness and palpitations.    Past Medical History  Diagnosis Date  . Diabetes mellitus 2007    HgA1C (02/20/2010) = 9.2, HgA1C (03/20/2009) = 12.1  . CVA (cerebrovascular accident) 3299     right embolic stroke, no residual deficits  . Diverticulitis   . CVA (cerebral infarction) 7-yrs ago  . Arthritis   . Dysphagia   . CHF (congestive heart failure)   . DIABETES MELLITUS, TYPE II 12/04/2005  . HYPERLIPIDEMIA 12/04/2005  . HYPERTENSION 12/04/2005  . GERD 12/04/2005  . ARTHRITIS, KNEE 03/24/2006  . Lung mass 07/08/2010  . Meningioma 07/21/2010  . Hemangioma of liver 12/02/2010  . Depression 12/02/2010  . Adenocarcinoma of lung      Right upper lobe adenocarcinoma. s/p right lower lobectomy 12/15/10   Current Outpatient Prescriptions  Medication Sig Dispense Refill  . albuterol (PROVENTIL HFA;VENTOLIN HFA) 108 (90 BASE) MCG/ACT inhaler Inhale 2 puffs into the lungs every 4 (four) hours as needed for wheezing.  3 Inhaler  1  . albuterol (PROVENTIL) (2.5 MG/3ML) 0.083% nebulizer solution Take 3 mLs (2.5 mg total) by nebulization every 6 (six) hours as needed for wheezing.  75 mL  12  . amLODipine (NORVASC) 10 MG tablet       . aspirin 81 MG tablet Take 81 mg by mouth daily.       . Blood Glucose Monitoring Suppl (ACCU-CHEK AVIVA PLUS) W/DEVICE KIT 1 each by Does not apply route 3 (three) times daily. dx  code 250.00 insulin requiring  1 kit  1  . carvedilol (COREG) 25 MG tablet Take 1 tablet (25 mg total) by mouth 2 (two) times daily with a meal.  180 tablet  4  . [facility] LANCETS MICRO THIN 33G MISC Check blood sugar 3 times a day before meals dx code 250.00 insulin requiring  100 each  12  . docusate sodium (COLACE) 100 MG capsule Take 1 capsule (100 mg total) by mouth daily as needed.  30 capsule  1  . furosemide (LASIX) 20 MG tablet Take 1 tablet (20 mg total) by mouth daily.  90 tablet  3  . glucose blood (ACCU-CHEK AVIVA PLUS) test strip Check blood sugar 3 times a day before meals dx code 250.00 insulin requiring  100 each  12  . insulin aspart (NOVOLOG FLEXPEN) 100 UNIT/ML FlexPen Take three times with meals  <70    2 units if going to eat- skip if going to skip meal   71-140    4 units Novolog   141-210  6 units Novolog   211-280  8 units  15 mL  11  . Insulin Glargine (LANTUS SOLOSTAR) 100 UNIT/ML Solostar Pen Inject 21 Units into the skin at bedtime.  10 mL  5  . iron polysaccharides (NIFEREX) 150 MG capsule Take 1 capsule (150 mg total) by  mouth daily.  30 capsule  6  . Lancet Devices (ACCU-CHEK SOFTCLIX) lancets Check blood sugar 3 times a day before meals dx code 250.00 insulin requiring  100 each  12  . lisinopril (PRINIVIL,ZESTRIL) 5 MG tablet Take 1 tablet (5 mg total) by mouth daily.  30 tablet  1  . pantoprazole (PROTONIX) 20 MG tablet Take 20 mg by mouth daily.      . Rivaroxaban 15 & 20 MG TBPK Take as directed on package: Start with one 73m tablet by mouth twice a day with food. On Day 22, switch to one 263mtablet once a day with food.  51 each  0  . simvastatin (ZOCOR) 20 MG tablet Take 20 mg by mouth daily.       No current facility-administered medications for this visit.   Family History  Problem Relation Age of Onset  . Hyperlipidemia Brother   . Hypertension Brother   . Diabetes Brother    History   Social History  . Marital Status: Widowed    Spouse Name: N/A      Number of Children: N/A  . Years of Education: 10   Occupational History  .  Unemployed   Social History Main Topics  . Smoking status: Former Smoker    Types: Cigarettes    Quit date: 02/17/2000  . Smokeless tobacco: Never Used  . Alcohol Use: No  . Drug Use: No  . Sexual Activity: Not Currently   Other Topics Concern  . None   Social History Narrative   [facility] as of 09/05/2009.   Review of Systems: Pertinent items are noted in HPI. Objective:  Physical Exam: Filed Vitals:   05/24/13 1352  BP: 126/78  Pulse: 75  Temp: 98.8 F (37.1 C)  TempSrc: Oral  Weight: 199 lb 12.8 oz (90.629 kg)  SpO2: 97%   Physical Exam  Constitutional: She appears well-developed and well-nourished. No distress.  HENT:  Head: Normocephalic.  Mouth/Throat: Oropharynx is clear and moist. No oropharyngeal exudate.  Genitourinary: There is no rash, tenderness, lesion or injury on the right labia. There is no rash, tenderness, lesion or injury on the left labia. No erythema, tenderness or bleeding around the vagina. No foreign body around the vagina. No signs of injury around the vagina. No vaginal discharge found.  No evidence of bleeding on vaginal exam.  Neurological: She is alert.  Skin: She is not diaphoretic.  Psychiatric: She has a normal mood and affect. Her behavior is normal.    Assessment & Plan:

## 2013-05-24 NOTE — Telephone Encounter (Signed)
Patient was seen in [facility] anticoagulation management clinic on Monday 6-APR-15. She had not taken warfarin since mid-March citing she had run out. After review of her case with the Attending Faculty Physician for that afternoon--and given all the issues, we elected with the advice and consent of the patient to commence her upon rivaroxaban using the "starter pak", which would be 15mg  BID x 21 days, transitioning to 20mg  QD thereafter. A "starter card" was activated for the patient which made the copay $0 dollars for this first 30 days of treatment. Daughter indicates she is MEDICAID in [place] which would see her subsequent refills costing her $3. [facility] as listed on the EMR. The patient was given the first dose in the [facility] (15mg ) and given a 2nd dose to take home for the following morning--as the Rx was not going to be ready for pick up until Tuesday 7-APR-15 in the afternoon. Today, Wednesday 8-APR-15 the daughter calls indicating she "was following my instructions....and calling if there was any observed bleeding". Daughter states her mother (the patient) awoke to notice a spot of blood on her Depends undergarment. Patient quantifies this as "about the size of a quarter". She states that when she voided this morning that "the water in the toilet bowl was pink". Daughter/patient denies any other site of bleeding, no increased bruising, etc. I discussed the case with Faculty Attending Physician for today (Dr. Murlean Caller) who indicated the patient should be seen this afternoon. I contacted the patient (spoke with [name]) and indicated she should come to clinic this afternoon at 1:45PM to be seen by one of our physicians. She indicated she would. The original prescription was PHONED to her Rx on Monday 6-APR-15.

## 2013-05-24 NOTE — Patient Instructions (Addendum)
Please restart your Xarelto as prescribed by Dr. Elie Confer.  I will call you with the results of the testing that we have done so far. Based on those results we may need to perform further testing. I am making a referral to urology to look at why your have having blood in your urine.  Please contact our clinic if your leg swelling becomes worse.   Please contact a doctor for new or worsening symptoms.

## 2013-05-24 NOTE — Telephone Encounter (Signed)
Received call from pt's daughter stating pt noted small amount of blood on her pamper this morning.  This is the first time this has happened, no other bleeding noted. Pt  Was started on Xarelto on 4/6.  She states she did not have bleeding on coumadin.  Pt #  [id number] Daughter's Cell # [phone]   Dr Telford Nab pager for advice.

## 2013-05-24 NOTE — Telephone Encounter (Signed)
Dr Elie Confer talked with pt and pt scheduled for OV today @ 1:45.  Pt is aware

## 2013-05-25 ENCOUNTER — Telehealth: Payer: Self-pay | Admitting: Internal Medicine

## 2013-05-25 ENCOUNTER — Other Ambulatory Visit: Payer: Self-pay | Admitting: Internal Medicine

## 2013-05-25 DIAGNOSIS — R600 Localized edema: Secondary | ICD-10-CM | POA: Insufficient documentation

## 2013-05-25 DIAGNOSIS — R319 Hematuria, unspecified: Secondary | ICD-10-CM

## 2013-05-25 MED ORDER — AMOXICILLIN-POT CLAVULANATE 500-125 MG PO TABS: 1.0000 | ORAL_TABLET | Freq: Two times a day (BID) | ORAL | Status: DC

## 2013-05-25 NOTE — Telephone Encounter (Signed)
Patient unavailable on all numbers attempted.

## 2013-05-25 NOTE — Assessment & Plan Note (Addendum)
The patient has new onset frank hematuria. She has history of microscopic hematuria. This bleeding occurred in the setting of recently starting Xarelto.  I am concerned about potential etiologies including infection and neoplasm. I recommended UA, CBC and patient to f/u with urology referral.  Vaginal exam was performed and there was no evidence of vaginal bleeding.  Further, I do not believe that the patients bleeding has hemodynamic significance. I recommended the patient to continue taking Xarelto given recent PE.

## 2013-05-25 NOTE — Assessment & Plan Note (Signed)
Appears stable. No symptoms of anemia at this time. However, given recent bleeding, will check CBC today.

## 2013-05-25 NOTE — Assessment & Plan Note (Signed)
Bleeding is very small. See hematuria plan. Plan to continue Xarelto at this time. Instructed the patient to contact MD if bleeding persists or becomes worse.

## 2013-05-25 NOTE — Assessment & Plan Note (Addendum)
UA appears infectious. Plan on augmentin.

## 2013-05-25 NOTE — Assessment & Plan Note (Addendum)
Patient had trace LE edema to mid calf bilaterally. This is possibly due to mild volume overload in setting of diastolic heart failure, but patient has no JVD or rales. Thus, it also may be due to amlodipine. However, patients BP is well controlled. I discussed this with the patient and family member. They agreed to continue to watch the leg swelling without intervention at this time. I recommended that they contact MD if leg swelling increases or other symptoms develop.   If the swelling increases, may consider increasing daily lasix.

## 2013-05-25 NOTE — Progress Notes (Signed)
Patients UA concerning for infection. Given new frank hematuria, I elected to treat with augmentin as other ABX are cautioned in elderly patients. I attempted to call patient and emergency contacts. No phone numbers were functional. I will place order with pharmacy and try again tomorrow. Will plan to f/u urine cultures.

## 2013-05-27 LAB — URINE CULTURE

## 2013-05-27 NOTE — Progress Notes (Signed)
I saw and evaluated the patient.  I personally confirmed the key portions of Dr. Laroy Apple history and exam and reviewed pertinent patient test results.  The assessment, diagnosis, and plan were formulated together and I agree with the documentation in the resident's note.

## 2013-05-29 ENCOUNTER — Ambulatory Visit: Payer: Medicare Other

## 2013-05-29 ENCOUNTER — Ambulatory Visit: Payer: Medicare Other | Admitting: Radiation Oncology

## 2013-05-29 NOTE — Progress Notes (Addendum)
Spoke to pt's dtr at [phone] and informed her that urine tests showed UTI and MD has sent rx to pharmacy for treatment.  Dtr verbalized understanding and will pick up rx today. Phone call complete.[name] C Goldston4/13/20158:52 AM

## 2013-06-01 ENCOUNTER — Ambulatory Visit (HOSPITAL_COMMUNITY)
Admission: RE | Admit: 2013-06-01 | Discharge: 2013-06-01 | Disposition: A | Discharge status: Home / Self Care | Payer: Medicare Other | Source: Ambulatory Visit | Attending: Neurosurgery | Admitting: Neurosurgery

## 2013-06-02 ENCOUNTER — Encounter: Payer: Self-pay | Admitting: Radiation Oncology

## 2013-06-02 NOTE — Progress Notes (Signed)
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 Christella Noa   Past or anticipated interventions, if any, per medical oncology:   Dose of Decadron, if applicable: course completed   Recent neurologic symptoms, if any:  Seizures: yes at presentation; keppra 500 mg bid  Headaches: yes  Nausea:  Dizziness/ataxia:  Difficulty with hand coordination:  Focal numbness/weakness:  Visual deficits/changes:  Confusion/Memory deficits:  Painful bone metastases at present, if any:  SAFETY ISSUES:  Prior radiation? Hx of lung ca ????  Pacemaker/ICD?  Possible current pregnancy? NO  Is the patient on methotrexate? NO  Additional Complaints / other details: 80 year old female. HOH. Ambulates with a walker. Speech clear and fluent. Coordination intact per Cabbell's 03/13/2013 noted.   This consultation has been rescheduled 3 times. Patient was a no show for MRI of brain scheduled for 06/01/2013.

## 2013-06-05 ENCOUNTER — Ambulatory Visit: Payer: Medicare Other | Attending: Radiation Oncology

## 2013-06-05 ENCOUNTER — Ambulatory Visit: Admission: RE | Admit: 2013-06-05 | Payer: Medicare Other | Source: Ambulatory Visit | Admitting: Radiation Oncology

## 2013-06-05 ENCOUNTER — Telehealth: Payer: Self-pay | Admitting: Radiation Oncology

## 2013-06-05 NOTE — Telephone Encounter (Signed)
Patient a no show for reconsult. Phoned patient's home. No answer and voicemail hasn't been set up thus, no message left. Also, phoned [place]. No answer, voicemail hadn't been set up thus, no message left.

## 2013-06-06 DIAGNOSIS — D320 Benign neoplasm of cerebral meninges: Secondary | ICD-10-CM | POA: Diagnosis not present

## 2013-06-08 ENCOUNTER — Encounter: Payer: Self-pay | Admitting: *Deleted

## 2013-06-09 ENCOUNTER — Ambulatory Visit (HOSPITAL_COMMUNITY)
Admission: RE | Admit: 2013-06-09 | Discharge: 2013-06-09 | Disposition: A | Discharge status: Home / Self Care | Payer: Medicare Other | Source: Ambulatory Visit | Attending: Neurosurgery | Admitting: Neurosurgery

## 2013-06-09 DIAGNOSIS — Z48811 Encounter for surgical aftercare following surgery on the nervous system: Secondary | ICD-10-CM | POA: Insufficient documentation

## 2013-06-09 DIAGNOSIS — D320 Benign neoplasm of cerebral meninges: Secondary | ICD-10-CM | POA: Diagnosis not present

## 2013-06-09 DIAGNOSIS — D329 Benign neoplasm of meninges, unspecified: Secondary | ICD-10-CM

## 2013-06-09 LAB — CREATININE, SERUM
Creatinine, Ser: 0.84 mg/dL (ref 0.50–1.10)
GFR, EST AFRICAN AMERICAN: 76 mL/min — AB (ref >90)
GFR, EST NON AFRICAN AMERICAN: 65 mL/min — AB (ref >90)

## 2013-06-09 MED ORDER — GADOBENATE DIMEGLUMINE 529 MG/ML IV SOLN
20.0000 mL | Freq: Once | INTRAVENOUS | Status: AC | PRN
  Administered 2013-06-09: 20 mL via INTRAVENOUS

## 2013-06-13 ENCOUNTER — Other Ambulatory Visit (HOSPITAL_COMMUNITY): Payer: Self-pay | Admitting: Internal Medicine

## 2013-06-16 ENCOUNTER — Other Ambulatory Visit (HOSPITAL_COMMUNITY): Payer: Self-pay | Admitting: Internal Medicine

## 2013-06-22 ENCOUNTER — Telehealth: Payer: Self-pay | Admitting: Internal Medicine

## 2013-06-22 ENCOUNTER — Other Ambulatory Visit (HOSPITAL_COMMUNITY): Payer: Self-pay | Admitting: Internal Medicine

## 2013-06-22 ENCOUNTER — Other Ambulatory Visit: Payer: Self-pay | Admitting: Internal Medicine

## 2013-06-22 MED ORDER — AMLODIPINE BESYLATE 10 MG PO TABS: 10.0000 mg | ORAL_TABLET | Freq: Every day | ORAL | Status: DC

## 2013-06-22 NOTE — Telephone Encounter (Signed)
   Reason for call:   I received a call from [name] at 530  PM indicating that her mother didn't receive her refills for her amlodipine and xarelto. There were indications in the chart that both of these medications had already been refilled on 06/22/13 and this was reviewed with the daughter. A call was then placed by this provider [facility] to validate refills and this was confirmed.   Pertinent Data:   Reviewed with daughter that 2 meds were available at the pharmacy   Assessment / Plan / Recommendations:   Chart reviewed   As always, pt is advised that if symptoms worsen or new symptoms arise, they should go to an urgent care facility or to to ER for further evaluation.   Clinton Gallant, MD   06/22/2013, 6:00 PM

## 2013-06-23 ENCOUNTER — Telehealth: Payer: Self-pay | Admitting: Internal Medicine

## 2013-06-23 ENCOUNTER — Encounter: Payer: Self-pay | Admitting: Internal Medicine

## 2013-06-23 ENCOUNTER — Other Ambulatory Visit: Payer: Self-pay | Admitting: *Deleted

## 2013-06-23 MED ORDER — RIVAROXABAN 20 MG PO TABS: 20.0000 mg | ORAL_TABLET | Freq: Every day | ORAL | Status: DC

## 2013-06-23 NOTE — Telephone Encounter (Signed)
Pt's daughter called and no answer.  Message left to call clinic for appointment today or Monday.

## 2013-06-23 NOTE — Telephone Encounter (Signed)
i keep  Getting refill requests for this. It was discontinued on hospital discharge in feb.

## 2013-06-23 NOTE — Telephone Encounter (Signed)
I agree with Dr. Algis Liming that this patient needs to be seen in [facility] right away. I have not seen the patient in clinic since last hospital discharge but per DC summary in Feb 2015, amlodipine was discontinued.  Additionally, on follow up opc visits with other providers it appears on the med list but does not have dosage or instructions or date filled during that time, thus it is unclear if patient was asked if she was taking the medication, and if so, where or when was it prescribed, as it does not appear to have been filled since feb. I have not refilled it recently due to prior discharge instructions, however it seems Dr. Algis Liming did order a refill last night.   Please have Ms. Hofbauer be seen in [place] as soon as possible, today would be better if not Monday at the latest and she should also be seen since it appears she has been loss to follow up for neurosurgery appointments as well.

## 2013-06-23 NOTE — Telephone Encounter (Signed)
Upon review of refill granted to patient on 06/22/13 for amlodipine there seems to be some conflicting documentation and evidence into the patient needing to continue or discontinue amlodipine. Pt BP readings are sometimes not controlled in 629B systolics and then other times in 110s SBP there is documentation that implies pt is taking amlodipine and maybe adding to some LE edema. At this point it is recommended that the patient come into the clinic for evaluation and a final decision can be made for the patient as there appears to be confusion for the patient and patient caregiver as was apparent during the last call on 06/22/13.   Pt was called to inform of this decision and to make an appt. This note was sent to PCP for consideration.

## 2013-06-23 NOTE — Telephone Encounter (Signed)
No new meds

## 2013-06-23 NOTE — Telephone Encounter (Signed)
Attending physician note: Medical history discuss with resident physician Dr. Gaspar Bidding  and I agree with management outlined above. Murriel Hopper, M.D., [place]

## 2013-06-23 NOTE — Telephone Encounter (Signed)
I have had multiple phone conversations with Triage, [name] daughter, [name], and [facility] resdidents ([name] and [name]) today in regards to [name]'s medications and recent medication refills.    It appears based on chart review that [name]'s amlodipine was discontinued by Dr. Gordy Levan after her hospitalization in February 15.  At that time, Lisinopril was also restarted.  Subsequent office visits do not directly address blood pressure charting and listed medications on office notes show amlodipine without the sig detail. Per Dr. Laroy Apple problem based charting for b/l  Leg edema, he notes patient to be on amlodipine that could be contributing, however, the patient technically should not have been on amlodipine per last discharge summary.  After my discussion with [name] today, she claims [name] has been taking the amlodipine for quite some time now and does not remember stopping it. However, based on prescription history, the last norvasc prescription appears to be in December 2014 with 3 refills, so unclear how she has had amlodipine in the meantime.  Having said that, yesterday evening, [name] received a phone call from [name] and refilled norvasc 10mg  daily which the patient has been taking.    Additionally, [name] also verbally gave approval or refill of Xarelto. Per [name], the order was given for 20mg  daily, however, unfortunately, when they picked up the prescription, it was for 30 tablets of 15mg  daily.  As a result, [name] last dose of 20mg  was 06/22/13 per [name], and she took the 15mg  tablet today.    After all this confusion and understandable frustration on [name]'s part who is trying to keep up with all the medications and appointments, I advised her to come to opc today with ALL her medications so we can check her blood pressure and adjust her BP medications accordingly (whether to continue both amlodipine and ACEi or discontinue one again) and ALSO to check the dose  of Xarelto and get her the correct dose.   Unfortunately, [name] was unable to bring her to the clinic today due to transportation issues but is able to bring her in Monday morning, ~11am.  I have relayed this information to triage, [name] who will schedule her an appointment. I have also updated [name], who will likely be seeing the patient.   Finally[facility] [address] staff, [name] and another associate about [name] incorrect Xarelto dose.  They informed me that she can return her current prescription that she picked up yesterday, and they will exchange it for the correct dose of 20mg  daily. She pays ~$3 for her prescription with medicaid. I then relayed this information back to [name] who will go to exchange the 15mg  dose for the 20mg  daily dose of Xarelto today and start giving [name] 20mg  daily starting tomorrow.    I have reviewed the above plan and discussions with Dr. Beryle Beams in [facility] today.

## 2013-06-26 ENCOUNTER — Ambulatory Visit (INDEPENDENT_AMBULATORY_CARE_PROVIDER_SITE_OTHER): Payer: Medicare Other | Admitting: Internal Medicine

## 2013-06-26 ENCOUNTER — Encounter: Payer: Self-pay | Admitting: Internal Medicine

## 2013-06-26 VITALS — BP 132/62 | HR 69 | Temp 97.8°F | Ht 64.0 in | Wt 204.1 lb

## 2013-06-26 DIAGNOSIS — I10 Essential (primary) hypertension: Secondary | ICD-10-CM | POA: Diagnosis not present

## 2013-06-26 DIAGNOSIS — I2699 Other pulmonary embolism without acute cor pulmonale: Secondary | ICD-10-CM

## 2013-06-26 DIAGNOSIS — R609 Edema, unspecified: Secondary | ICD-10-CM | POA: Diagnosis not present

## 2013-06-26 DIAGNOSIS — R600 Localized edema: Secondary | ICD-10-CM

## 2013-06-26 MED ORDER — LISINOPRIL 5 MG PO TABS: 5.0000 mg | ORAL_TABLET | Freq: Every day | ORAL | Status: DC

## 2013-06-26 NOTE — Assessment & Plan Note (Signed)
Patient is back on Xarelto 20 mg qd. The confusion about the dosing of Xarelto has been resolved and patient should be on 20 mg qd.

## 2013-06-26 NOTE — Progress Notes (Signed)
Subjective:   Patient ID: [name] female   DOB: 1933-04-13 79 y.o.   MRN: [id number]  HPI: [name] is a 80 y.o. with PMH significant for HTN, DM- II, Meningioma s/p craniotomy and resection, PE on Xarelto comes to the office for review of her medications and to check her swelling of legs and BP.  Patient is here in the office with her daughter, who is her primary provider. Most of the history was obtained from her daughter. Apparently when patients daughter picked up the prescription of Xarelto last week, she was given a prescription for Xarelto 15 mg (instead of 20 mg). Pharmacy was contacted by Dr. [name] on 06/23/13 and pharmacy agreed to exchange it for the correct dose of 20 mg qd. Patients daughter already picked up the new prescription of 20 mg yesterday and started taking it from yesterday.   Patients Amlodipine was held during the hospitalization in feb 2015 and the discharge plan was to discontinue. But patients daughter reports taking the medicine after discharge as well and was currently refilled. There was an office visit note recently with a concern for amlodipine causing or contributing her swelling of legs.   Patient denies any other complaints during this office visit and reports compliance to all her medications.   Past Medical History  Diagnosis Date  . Diabetes mellitus 2007    HgA1C (02/20/2010) = 9.2, HgA1C (03/20/2009) = 12.1  . CVA (cerebrovascular accident) 3235     right embolic stroke, no residual deficits  . Diverticulitis   . CVA (cerebral infarction) 7-yrs ago  . Arthritis   . Dysphagia   . CHF (congestive heart failure)   . DIABETES MELLITUS, TYPE II 12/04/2005  . HYPERLIPIDEMIA 12/04/2005  . HYPERTENSION 12/04/2005  . GERD 12/04/2005  . ARTHRITIS, KNEE 03/24/2006  . Lung mass 07/08/2010  . Meningioma 07/21/2010  . Hemangioma of liver 12/02/2010  . Depression 12/02/2010  . Adenocarcinoma of lung      Right upper lobe adenocarcinoma.  s/p right lower lobectomy 12/15/10  . 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 albuterol (PROVENTIL HFA;VENTOLIN HFA) 108 (90 BASE) MCG/ACT inhaler Inhale 2 puffs into the lungs every 4 (four) hours as needed for wheezing.  3 Inhaler  1  . albuterol (PROVENTIL) (2.5 MG/3ML) 0.083% nebulizer solution Take 3 mLs (2.5 mg total) by nebulization every 6 (six) hours as needed for wheezing.  75 mL  12  . amLODipine (NORVASC) 10 MG tablet Take 1 tablet (10 mg total) by mouth daily.  30 tablet  1  . aspirin 81 MG tablet Take 81 mg by mouth daily.       . Blood Glucose Monitoring Suppl (ACCU-CHEK AVIVA PLUS) W/DEVICE KIT 1 each by Does not apply route 3 (three) times daily. dx code 250.00 insulin requiring  1 kit  1  . carvedilol (COREG) 25 MG tablet Take 1 tablet (25 mg total) by mouth 2 (two) times daily with a meal.  180 tablet  4  . [facility] LANCETS MICRO THIN 33G MISC Check blood sugar 3 times a day before meals dx code 250.00 insulin requiring  100 each  12  . docusate sodium (COLACE) 100 MG capsule Take 1 capsule (100 mg total) by mouth daily as needed.  30 capsule  1  . furosemide (LASIX) 20 MG tablet Take 1 tablet (20 mg total) by mouth  daily.  90 tablet  3  . glucose blood (ACCU-CHEK AVIVA PLUS) test strip Check blood sugar 3 times a day before meals dx code 250.00 insulin requiring  100 each  12  . insulin aspart (NOVOLOG FLEXPEN) 100 UNIT/ML FlexPen Take three times with meals  <70    2 units if going to eat- skip if going to skip meal   71-140    4 units Novolog   141-210  6 units Novolog   211-280  8 units  15 mL  11  . Insulin Glargine (LANTUS SOLOSTAR) 100 UNIT/ML Solostar Pen Inject 21 Units into the skin at bedtime.  10 mL  5  . iron polysaccharides (NIFEREX) 150 MG capsule Take 1 capsule (150 mg total) by mouth daily.  30 capsule  6  . Lancet Devices (ACCU-CHEK SOFTCLIX) lancets Check blood sugar  3 times a day before meals dx code 250.00 insulin requiring  100 each  12  . levETIRAcetam (KEPPRA) 500 MG tablet       . lisinopril (PRINIVIL,ZESTRIL) 5 MG tablet Take 1 tablet (5 mg total) by mouth daily.  30 tablet  1  . pantoprazole (PROTONIX) 20 MG tablet Take 20 mg by mouth daily.      . rivaroxaban (XARELTO) 20 MG TABS tablet Take 1 tablet (20 mg total) by mouth daily with supper.  30 tablet  5  . simvastatin (ZOCOR) 20 MG tablet Take 20 mg by mouth daily.       No current facility-administered medications for this visit.   Family History  Problem Relation Age of Onset  . Hyperlipidemia Brother   . Hypertension Brother   . Diabetes Brother    History   Social History  . Marital Status: Widowed    Spouse Name: N/A    Number of Children: N/A  . Years of Education: 10   Occupational History  .  Unemployed   Social History Main Topics  . Smoking status: Former Smoker    Types: Cigarettes    Quit date: 02/17/2000  . Smokeless tobacco: Never Used  . Alcohol Use: No  . Drug Use: No  . Sexual Activity: Not Currently   Other Topics Concern  . None   Social History Narrative   Patient requests to use [facility] for her diabets testing supplies as of 09/05/2009.   Review of Systems: Pertinent items are noted in HPI. Objective:  Physical Exam: Filed Vitals:   06/26/13 1114  BP: 132/62  Pulse: 69  Temp: 97.8 F (36.6 C)  TempSrc: Oral  Height: 5' 4" (1.626 m)  Weight: 204 lb 1.6 oz (92.579 kg)  SpO2: 100%   Constitutional: Vital signs reviewed.   Patient is a well-developed and well-nourished and is in no acute distress and cooperative with exam. Alert and oriented x3.  Head: Normocephalic and atraumatic Cardiovascular: RRR, S1 normal, S2 normal, no MRG. Pulmonary/Chest: normal respiratory effort, CTAB, no wheezes, rales, or rhonchi. Musculoskeletal: B/L unequal pitting edema noted in lower extremities. RLE 1-2+ slightly tense pitting edema noted from  ankle to the knee. LLE 1+ non-tense, pitting edema noted from ankle to knee. No signs of cellulitis or calf tenderness noted. Neurological: A&O x3, Strength is 4-5/5 in all four extremities and is symmetric.   Skin: Warm, dry and intact.  Psychiatric: Normal mood and affect.   Assessment & Plan:    

## 2013-06-26 NOTE — Assessment & Plan Note (Signed)
Well controlled (today in the office and the most recent values on the current regimen. (ACEI, CCB, BB, Lasix). Patient reports compliance to all the medications including her Amlodipine. Discussed with the attending regarding further management.  Plans: D/C Amlodipine (I do not see any clinical indication for CCB and to reduce polypharmacy. Also, to see if its contributing or causing the swelling of legs.) Recommended to check BP daily and keep a log and bring it to the next office visit. Given her age and co-morbidities, taget BP of 150/90 would be reasonable. Follow up in a week. If the BP run >150/90, consider increasing Lisinopril.

## 2013-06-26 NOTE — Progress Notes (Signed)
Case discussed with Dr. Boggala at the time of the visit.  We reviewed the resident's history and exam and pertinent patient test results.  I agree with the assessment, diagnosis, and plan of care documented in the resident's note. 

## 2013-06-26 NOTE — Assessment & Plan Note (Signed)
Unclear etiology currently. Clinically patient is doing well and no other symptoms or signs of CHF. Not sure if Amlodipine is a causative or a contributing factor.  Plans D/C Amlodipine. F/U in a month to re-evaluate swelling of legs. Recommended patient to elevate her legs above the heart level 30 minutes, 3-4 times a day. If the swelling persists after discontinuing Amlodipine for a month, consider temporarily increasing Lasix to see if it helps.

## 2013-06-26 NOTE — Patient Instructions (Signed)
Stop taking the Amlodipine. Your Lisinopril refill is sent to your pharmacy. Take all the other medications as advised below. Check your BP once daily and keep a log of all the values and bring the log to your next office visit. If your blood pressure is more than 150/90 persistently, please call the office to inform us. If your swelling of legs worsen, or develop shortness of breath, please seek medical help.

## 2013-06-29 ENCOUNTER — Ambulatory Visit: Payer: Medicare Other | Admitting: Radiation Oncology

## 2013-06-29 ENCOUNTER — Other Ambulatory Visit: Payer: Self-pay | Admitting: Radiation Therapy

## 2013-06-29 ENCOUNTER — Encounter: Payer: Self-pay | Admitting: Radiation Oncology

## 2013-06-29 ENCOUNTER — Ambulatory Visit
Admission: RE | Admit: 2013-06-29 | Discharge: 2013-06-29 | Disposition: A | Discharge status: Home / Self Care | Payer: Medicare Other | Source: Ambulatory Visit | Attending: Radiation Oncology | Admitting: Radiation Oncology

## 2013-06-29 ENCOUNTER — Ambulatory Visit: Payer: Medicare Other

## 2013-06-29 VITALS — BP 144/76 | HR 74 | Temp 98.2°F | Resp 20 | Wt 207.2 lb

## 2013-06-29 DIAGNOSIS — Z7982 Long term (current) use of aspirin: Secondary | ICD-10-CM | POA: Insufficient documentation

## 2013-06-29 DIAGNOSIS — Z87891 Personal history of nicotine dependence: Secondary | ICD-10-CM | POA: Insufficient documentation

## 2013-06-29 DIAGNOSIS — I10 Essential (primary) hypertension: Secondary | ICD-10-CM | POA: Insufficient documentation

## 2013-06-29 DIAGNOSIS — Z85118 Personal history of other malignant neoplasm of bronchus and lung: Secondary | ICD-10-CM | POA: Insufficient documentation

## 2013-06-29 DIAGNOSIS — Z8673 Personal history of transient ischemic attack (TIA), and cerebral infarction without residual deficits: Secondary | ICD-10-CM | POA: Diagnosis not present

## 2013-06-29 DIAGNOSIS — Z79899 Other long term (current) drug therapy: Secondary | ICD-10-CM | POA: Insufficient documentation

## 2013-06-29 DIAGNOSIS — E785 Hyperlipidemia, unspecified: Secondary | ICD-10-CM | POA: Insufficient documentation

## 2013-06-29 DIAGNOSIS — D320 Benign neoplasm of cerebral meninges: Secondary | ICD-10-CM | POA: Insufficient documentation

## 2013-06-29 DIAGNOSIS — Z7901 Long term (current) use of anticoagulants: Secondary | ICD-10-CM | POA: Insufficient documentation

## 2013-06-29 DIAGNOSIS — D329 Benign neoplasm of meninges, unspecified: Secondary | ICD-10-CM

## 2013-06-29 DIAGNOSIS — E119 Type 2 diabetes mellitus without complications: Secondary | ICD-10-CM | POA: Diagnosis not present

## 2013-06-29 DIAGNOSIS — Z90710 Acquired absence of both cervix and uterus: Secondary | ICD-10-CM | POA: Diagnosis not present

## 2013-06-29 DIAGNOSIS — C711 Malignant neoplasm of frontal lobe: Secondary | ICD-10-CM | POA: Diagnosis not present

## 2013-06-29 NOTE — Progress Notes (Addendum)
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 Christella Noa  Past or anticipated interventions, if any, per medical oncology:  Dose of Decadron, if applicable: course completed  Recent neurologic symptoms, if any:  Seizures: yes at presentation; keppra 500 mg bid  Headaches: yes  Nausea: No Dizziness/ataxia: No Difficulty with hand coordination: no Focal numbness/weakness: weakness, numbness feet  In w/c, unsteady Visual deficits/changes: No Confusion/Memory deficits: No Painful bone metastases at present, if any: No SAFETY ISSUES:  Prior radiation? No radiation , did have lobectomy right upper lobe bx  Dr. Arlyce Dice 12/15/10  Pacemaker/ICD? No Possible current pregnancy? NO  Is the patient on methotrexate? NO Additional Complaints / other details: 80 year old female. HOH. Ambulates with a walker. Speech clear and fluent. Coordination intact per Cabbell's 03/13/2013 noted.  This consultation has been rescheduled 4 times. Patient was a no show for MRI of brain scheduled for 06/01/2013.  Path of right lung resection 12/15/10 right upper lobe=Invasive mod to poorly differentiated adenocarcinoma

## 2013-06-29 NOTE — Progress Notes (Signed)
Please see the Nurse Progress Note in the MD Initial Consult Encounter for this patient. 

## 2013-06-29 NOTE — Addendum Note (Signed)
Encounter addended by: Deirdre Evener, RN on: 06/29/2013  6:09 PM<BR>     Documentation filed: Charges VN

## 2013-06-29 NOTE — Progress Notes (Signed)
Radiation Oncology         [phone] ________________________________  Initial outpatient Consultation  Name: [name] MRN: [id number]  Date: 06/29/2013  DOB: [birth year]  UX:NATFTDD, Blanch Media, MD  Winfield Cunas, MD   REFERRING PHYSICIAN: Winfield Cunas, MD  DIAGNOSIS: 80 year old woman status post gross total resection of an 8 cm right frontal grade 2 meningioma  HPI:  [name] is a 80 y.o. female with a history of early stage right upper lung cancer resected on 12/15/2010. That was a 3.8 cm poorly from showed adenocarcinoma with no lymph node involvement. The patient did not require any adjuvant treatment. During the time of her staging workup, she was noted to have a right frontal convexity meningioma. The patient returned with a seizure on 02/25/2013. Brain MRI demonstrated an 8 cm extra-axial enhancing mass in the right frontal convexity showing considerable growth since 2012 exerting mass effect on the anterior aspect of the right hemisphere with associated vasogenic edema. She was admitted to the hospital and underwent reported gross total resection. Pathology revealed grade 2 meningioma. Postoperatively, patient underwent baseline brain MRI on 06/09/2013. This showed a postoperative fluid collection in the operative region measuring 5.5x2x4.5 cm. There was encephalomalacia in the right frontal brain. The patient was noted to have continued dural thickening and enhancement without focal mass potentially representing postoperative dural thickening and enhancement or possibly residual dural tumor. She has kindly been referred today to discuss potential radiation treatment options.  PREVIOUS RADIATION THERAPY: No  PAST MEDICAL HISTORY:  has a past medical history of Diabetes mellitus (2007); CVA (cerebrovascular accident) (2006 ); Diverticulitis; CVA (cerebral infarction) (7-yrs ago); Arthritis; Dysphagia; CHF (congestive heart failure); DIABETES MELLITUS, TYPE II  (12/04/2005); HYPERLIPIDEMIA (12/04/2005); HYPERTENSION (12/04/2005); GERD (12/04/2005); ARTHRITIS, KNEE (03/24/2006); Lung mass (07/08/2010); Meningioma (07/21/2010); Hemangioma of liver (12/02/2010); Depression (12/02/2010); Adenocarcinoma of lung; and Brain cancer.    PAST SURGICAL HISTORY: Past Surgical History  Procedure Laterality Date  . Abdominal hysterectomy    . Video bronchoscope.  12/29/2007    [place]  . Wide excision of left upper back mass.    . Extracapsular cataract extraction with intraocular      lens implantation.  . Right vats,right thoracotomy,right lower lobectomy with node dissection    . Incision and drainage perirectal abscess N/A 01/19/2013    Procedure: IRRIGATION AND DEBRIDEMENT PERIRECTAL ABSCESS;  Surgeon: Zenovia Jarred, MD;  Location: [facility];  Service: General;  Laterality: N/A;  . Craniotomy N/A 03/02/2013    Procedure: CRANIOTOMY TUMOR EXCISION;  Surgeon: Winfield Cunas, MD;  Location: [facility];  Service: Neurosurgery;  Laterality: N/A;  Bifrontal Craniotomy for tumor    FAMILY HISTORY: family history includes Diabetes in her brother; Hyperlipidemia in her brother; Hypertension in her brother.  SOCIAL HISTORY:  reports that she quit smoking about 13 years ago. Her smoking use included Cigarettes. She smoked 0.00 packs per day. She has never used smokeless tobacco. She reports that she does not drink alcohol or use illicit drugs.  ALLERGIES: Review of patient's allergies indicates no known allergies.  MEDICATIONS:  Current Outpatient Prescriptions  Medication Sig Dispense Refill  . albuterol (PROVENTIL HFA;VENTOLIN HFA) 108 (90 BASE) MCG/ACT inhaler Inhale 2 puffs into the lungs every 4 (four) hours as needed for wheezing.  3 Inhaler  1  . albuterol (PROVENTIL) (2.5 MG/3ML) 0.083% nebulizer solution Take 3 mLs (2.5 mg total) by nebulization every 6 (six) hours as needed for wheezing.  75 mL  12  .  aspirin 81 MG tablet Take 81 mg by mouth daily.       .  Blood Glucose Monitoring Suppl (ACCU-CHEK AVIVA PLUS) W/DEVICE KIT 1 each by Does not apply route 3 (three) times daily. dx code 250.00 insulin requiring  1 kit  1  . carvedilol (COREG) 25 MG tablet Take 1 tablet (25 mg total) by mouth 2 (two) times daily with a meal.  180 tablet  4  . [employer] LANCETS MICRO THIN 33G MISC Check blood sugar 3 times a day before meals dx code 250.00 insulin requiring  100 each  12  . docusate sodium (COLACE) 100 MG capsule Take 1 capsule (100 mg total) by mouth daily as needed.  30 capsule  1  . furosemide (LASIX) 20 MG tablet Take 1 tablet (20 mg total) by mouth daily.  90 tablet  3  . glucose blood (ACCU-CHEK AVIVA PLUS) test strip Check blood sugar 3 times a day before meals dx code 250.00 insulin requiring  100 each  12  . insulin aspart (NOVOLOG FLEXPEN) 100 UNIT/ML FlexPen Take three times with meals  <70    2 units if going to eat- skip if going to skip meal   71-140    4 units Novolog   141-210  6 units Novolog   211-280  8 units  15 mL  11  . Insulin Glargine (LANTUS SOLOSTAR) 100 UNIT/ML Solostar Pen Inject 21 Units into the skin at bedtime.  10 mL  5  . iron polysaccharides (NIFEREX) 150 MG capsule Take 1 capsule (150 mg total) by mouth daily.  30 capsule  6  . Lancet Devices (ACCU-CHEK SOFTCLIX) lancets Check blood sugar 3 times a day before meals dx code 250.00 insulin requiring  100 each  12  . levETIRAcetam (KEPPRA) 500 MG tablet 500 mg 2 (two) times daily.       Marland Kitchen lisinopril (PRINIVIL,ZESTRIL) 5 MG tablet Take 1 tablet (5 mg total) by mouth daily.  30 tablet  5  . pantoprazole (PROTONIX) 20 MG tablet Take 20 mg by mouth daily.      . rivaroxaban (XARELTO) 20 MG TABS tablet Take 1 tablet (20 mg total) by mouth daily with supper.  30 tablet  5  . simvastatin (ZOCOR) 20 MG tablet Take 20 mg by mouth daily.       No current facility-administered medications for this encounter.    REVIEW OF SYSTEMS:  A 15 point review of systems is documented in the  electronic medical record. This was obtained by the nursing staff. However, I reviewed this with the patient to discuss relevant findings and make appropriate changes.  A comprehensive review of systems was negative. the patient leads a sedentary lifestyle spending time in her bedroom and moving to the living room with assistance and support from her children.   PHYSICAL EXAM:  weight is 207 lb 3.2 oz (93.985 kg). Her oral temperature is 98.2 F (36.8 C). Her blood pressure is 144/76 and her pulse is 74. Her respiration is 20 and oxygen saturation is 100%.   The patient is an elderly woman in no acute distress her he chooses alert and oriented but slow to respond to questioning. Speech is fluent and intact. Motor strength is intact throughout. Mood and affect are appropriate.  KPS = 70  100 - Normal; no complaints; no evidence of disease. 90   - Able to carry on normal activity; minor signs or symptoms of disease. 80   - Normal activity  with effort; some signs or symptoms of disease. 64   - Cares for self; unable to carry on normal activity or to do active work. 60   - Requires occasional assistance, but is able to care for most of his personal needs. 50   - Requires considerable assistance and frequent medical care. 64   - Disabled; requires special care and assistance. 77   - Severely disabled; hospital admission is indicated although death not imminent. 29   - Very sick; hospital admission necessary; active supportive treatment necessary. 10   - Moribund; fatal processes progressing rapidly. 0     - Dead  Karnofsky DA, Abelmann Kitzmiller, Craver LS and Burchenal Navos [phone]) The use of the nitrogen mustards in the palliative treatment of carcinoma: with particular reference to bronchogenic carcinoma Cancer 1 634-56  LABORATORY DATA:  Lab Results  Component Value Date   WBC 5.8 05/24/2013   HGB 9.3* 05/24/2013   HCT 30.2* 05/24/2013   MCV 82.1 05/24/2013   PLT 245 05/24/2013   Lab Results  Component  Value Date   NA 145 04/10/2013   K 3.8 04/10/2013   CL 108 04/10/2013   CO2 26 04/10/2013   Lab Results  Component Value Date   ALT 11 04/05/2013   AST 18 04/05/2013   ALKPHOS 106 04/05/2013   BILITOT 0.3 04/05/2013     RADIOGRAPHY: Mr Jeri Cos Wo Contrast  06/09/2013   CLINICAL DATA:  Followup previous craniotomy for tumor resection right frontal region  EXAM: MRI HEAD WITHOUT AND WITH CONTRAST  TECHNIQUE: Multiplanar, multiecho pulse sequences of the brain and surrounding structures were obtained without and with intravenous contrast.  CONTRAST:  70m MULTIHANCE GADOBENATE DIMEGLUMINE 529 MG/ML IV SOLN  COMPARISON:  CT 04/03/2013.  MRI 02/25/2013.  FINDINGS: There has been right frontal craniotomy for resection/ debulking of a previously seen large right frontal region meningioma. The majority of the tumor mass has been successfully removed. There continues to be dural thickening and enhancement in the region that could be postoperative or could be associated with residual tumor. There is a fluid collection in the operative region measuring approximately 5.5 x 2 x 4.5 cm. There is encephalomalacia in the right frontal region and there is no significant mass effect and no shift. After contrast administration, the pattern of parenchymal enhancement demonstrated previously is not noted  Small right tentorial meningioma measuring less than a cm is again noted.  Old infarction right external capsule region/basal ganglia is unchanged. No hydrocephalus.  IMPRESSION: Considerable debulking of a right frontal region meningioma. There is continued dural thickening and enhancement without a focal mass. This could be postoperative dural thickening and enhancement but certainly the possibility of some residual dural tumor does exist. This can be followed for future change. Fluid collection at the site of tumor resection without significant mass effect, because of encephalomalacia in the right frontal lobe. Penetrating  enhancement pattern seen on the preoperative study is not demonstrated today.   Electronically Signed   By: MNelson ChimesM.D.   On: 06/09/2013 18:23      IMPRESSION: This patient is a very nice 80year-old woman status post resection of an 8 cm grade 2 right frontal convexity meningioma. Her performance status is limited, however improved over her preoperative baseline. Her postoperative brain MRI shows some residual dural thickening potentially representing postoperative changes versus residual tumor.  PLAN:Today, I talked to the patient and family about the findings and work-up thus far.  We discussed the  natural history of grade 2 meningioma and general treatment, highlighting the role or radiotherapy in the management in order to reduce the risk of local recurrence.  We discussed the available radiation techniques, and focused on the details of logistics and delivery.  We reviewed the anticipated acute and late sequelae associated with radiation in this setting.  The patient was encouraged to ask questions that I answered to the best of my ability.    Given the patient's advanced age and poor performance status, I would recommend repeat brain MRI in 3 months foregoing adjuvant radiotherapy in favor of salvage radiotherapy at the time of proven tumor recurrence, if it is not felt to be resectable.  I spent 60 minutes minutes face to face with the patient and more than 50% of that time was spent in counseling and/or coordination of care.     ------------------------------------------------  Sheral Apley. Tammi Klippel, M.D.

## 2013-06-30 NOTE — Addendum Note (Signed)
Encounter addended by: Deirdre Evener, RN on: 06/30/2013  2:05 PM<BR>     Documentation filed: Charges VN

## 2013-08-01 ENCOUNTER — Encounter: Payer: Self-pay | Admitting: Internal Medicine

## 2013-08-01 ENCOUNTER — Encounter: Payer: Medicare Other | Admitting: Internal Medicine

## 2013-09-05 ENCOUNTER — Encounter: Payer: Medicare Other | Admitting: Internal Medicine

## 2013-09-06 ENCOUNTER — Other Ambulatory Visit: Payer: Self-pay | Admitting: Radiation Therapy

## 2013-09-06 DIAGNOSIS — D320 Benign neoplasm of cerebral meninges: Secondary | ICD-10-CM

## 2013-09-08 ENCOUNTER — Ambulatory Visit
Admission: RE | Admit: 2013-09-08 | Discharge: 2013-09-08 | Disposition: A | Discharge status: Home / Self Care | Payer: Medicare Other | Source: Ambulatory Visit | Attending: Radiation Oncology | Admitting: Radiation Oncology

## 2013-09-08 DIAGNOSIS — D329 Benign neoplasm of meninges, unspecified: Secondary | ICD-10-CM

## 2013-09-08 DIAGNOSIS — C3490 Malignant neoplasm of unspecified part of unspecified bronchus or lung: Secondary | ICD-10-CM | POA: Diagnosis not present

## 2013-09-08 DIAGNOSIS — D320 Benign neoplasm of cerebral meninges: Secondary | ICD-10-CM

## 2013-09-08 LAB — BUN AND CREATININE (CC13)
BUN: 17.3 mg/dL (ref 7.0–26.0)
CREATININE: 1 mg/dL (ref 0.6–1.1)

## 2013-09-08 MED ORDER — GADOBENATE DIMEGLUMINE 529 MG/ML IV SOLN
20.0000 mL | Freq: Once | INTRAVENOUS | Status: AC | PRN
  Administered 2013-09-08: 20 mL via INTRAVENOUS

## 2013-09-12 ENCOUNTER — Encounter: Payer: Medicare Other | Admitting: Internal Medicine

## 2013-09-13 ENCOUNTER — Ambulatory Visit: Payer: Medicare Other | Admitting: Radiation Oncology

## 2013-09-13 ENCOUNTER — Ambulatory Visit
Admission: RE | Admit: 2013-09-13 | Discharge: 2013-09-13 | Disposition: A | Discharge status: Home / Self Care | Payer: Medicare Other | Source: Ambulatory Visit | Attending: Radiation Oncology | Admitting: Radiation Oncology

## 2013-09-13 ENCOUNTER — Ambulatory Visit: Payer: Medicare Other

## 2013-09-13 ENCOUNTER — Telehealth: Payer: Self-pay | Admitting: Radiation Oncology

## 2013-09-13 NOTE — Telephone Encounter (Signed)
Patient did not show for reconsult with Dr. Tammi Klippel. Phoned patient's home. No answer. No option to leave a message. Notified Mont Dutton and Dr. Tammi Klippel of this finding.

## 2013-09-19 ENCOUNTER — Ambulatory Visit: Payer: Medicare Other | Admitting: Internal Medicine

## 2013-09-25 ENCOUNTER — Other Ambulatory Visit: Payer: Self-pay | Admitting: Internal Medicine

## 2013-09-26 NOTE — Telephone Encounter (Signed)
Okay great thank you.   i refilled it, she still needs a follow up visit please.  Thanks,  Dr q

## 2013-09-26 NOTE — Telephone Encounter (Signed)
Dr [name], the last script was 09/21/2012 #90 w/ 3 add refills, you approved the script, you may find this in the med history, the pharmacy confirms that pt has been filling and the last fill was 5/9 #90.

## 2013-09-26 NOTE — Telephone Encounter (Signed)
Is she still taking this? Has not been filled for at least one year.   Please have her scheduled in opc this month for health maintenance visit, she is much over due for several things including a1c and lipid panel check.   Thanks,  Dr. Barnetta Chapel

## 2013-10-05 ENCOUNTER — Telehealth: Payer: Self-pay | Admitting: Internal Medicine

## 2013-10-05 NOTE — Telephone Encounter (Signed)
   Reason for call:   I received a call from [name]'s daughter who is her primary caregiver at 65 PM indicating that her mother has not moved from a spot on the couch and has even urinated on the furniture. The patient didn't have any tongue biting, twitching, weakness, and the urinary incontinence was purposeful and there was no stool incontinence. The patient just appears to be apathetic but continues to eat and take all her medications. The patient denied any chest pains, SOB, HA, blurry vision, abdominal pain, no worsening of stable LE edema, nausea/vomiting/diarrhea, or weakness. The patient said "just don't move me from this spot and leave me alone." The daughter then began to cry as she is becoming overwhelmed by her mother's care and lack of sibling support.    Pertinent Data:   Chart review on medications and most recent notes   Assessment / Plan / Recommendations:   It appears that pt missed her 3 month f/u with Dr. Tammi Klippel in regards to her meningioma resection daughter states pt was apathetic and refused to get dressed to go. It appears that pt is exhibiting signs of regression and severe depression and less likely seizure activity although there is risk given her resection and previous seizure but per daughter nothing has been witnessed and pt has no change in mentation or appropriateness in response. Therefore clinic appt will be made for 10/06/13 for evaluation.   As always, pt is advised that if symptoms worsen or new symptoms arise, they should go to an urgent care facility or to to ER for further evaluation.   Clinton Gallant, MD   10/05/2013, 10:35 PM

## 2013-10-06 ENCOUNTER — Inpatient Hospital Stay (HOSPITAL_COMMUNITY): Payer: Medicare Other

## 2013-10-06 ENCOUNTER — Encounter (HOSPITAL_COMMUNITY): Payer: Self-pay | Admitting: Emergency Medicine

## 2013-10-06 ENCOUNTER — Inpatient Hospital Stay (HOSPITAL_COMMUNITY)
Admission: EM | Admit: 2013-10-06 | Discharge: 2013-10-08 | DRG: 872 | Disposition: A | Discharge status: Home / Self Care | Payer: Medicare Other | Attending: Internal Medicine | Admitting: Internal Medicine

## 2013-10-06 ENCOUNTER — Emergency Department (HOSPITAL_COMMUNITY): Payer: Medicare Other

## 2013-10-06 DIAGNOSIS — K920 Hematemesis: Secondary | ICD-10-CM | POA: Diagnosis not present

## 2013-10-06 DIAGNOSIS — K5900 Constipation, unspecified: Secondary | ICD-10-CM | POA: Diagnosis not present

## 2013-10-06 DIAGNOSIS — R350 Frequency of micturition: Secondary | ICD-10-CM

## 2013-10-06 DIAGNOSIS — F3289 Other specified depressive episodes: Secondary | ICD-10-CM | POA: Diagnosis present

## 2013-10-06 DIAGNOSIS — Z85118 Personal history of other malignant neoplasm of bronchus and lung: Secondary | ICD-10-CM

## 2013-10-06 DIAGNOSIS — R319 Hematuria, unspecified: Secondary | ICD-10-CM

## 2013-10-06 DIAGNOSIS — I152 Hypertension secondary to endocrine disorders: Secondary | ICD-10-CM | POA: Diagnosis present

## 2013-10-06 DIAGNOSIS — I5042 Chronic combined systolic (congestive) and diastolic (congestive) heart failure: Secondary | ICD-10-CM | POA: Diagnosis present

## 2013-10-06 DIAGNOSIS — N179 Acute kidney failure, unspecified: Secondary | ICD-10-CM | POA: Diagnosis present

## 2013-10-06 DIAGNOSIS — I5020 Unspecified systolic (congestive) heart failure: Secondary | ICD-10-CM | POA: Diagnosis present

## 2013-10-06 DIAGNOSIS — N390 Urinary tract infection, site not specified: Secondary | ICD-10-CM

## 2013-10-06 DIAGNOSIS — Z87891 Personal history of nicotine dependence: Secondary | ICD-10-CM

## 2013-10-06 DIAGNOSIS — M129 Arthropathy, unspecified: Secondary | ICD-10-CM | POA: Diagnosis present

## 2013-10-06 DIAGNOSIS — R300 Dysuria: Secondary | ICD-10-CM

## 2013-10-06 DIAGNOSIS — Z794 Long term (current) use of insulin: Secondary | ICD-10-CM

## 2013-10-06 DIAGNOSIS — Z833 Family history of diabetes mellitus: Secondary | ICD-10-CM

## 2013-10-06 DIAGNOSIS — I129 Hypertensive chronic kidney disease with stage 1 through stage 4 chronic kidney disease, or unspecified chronic kidney disease: Secondary | ICD-10-CM | POA: Diagnosis present

## 2013-10-06 DIAGNOSIS — E1159 Type 2 diabetes mellitus with other circulatory complications: Secondary | ICD-10-CM | POA: Diagnosis present

## 2013-10-06 DIAGNOSIS — R34 Anuria and oliguria: Secondary | ICD-10-CM | POA: Diagnosis not present

## 2013-10-06 DIAGNOSIS — Z86011 Personal history of benign neoplasm of the brain: Secondary | ICD-10-CM | POA: Diagnosis present

## 2013-10-06 DIAGNOSIS — Z8673 Personal history of transient ischemic attack (TIA), and cerebral infarction without residual deficits: Secondary | ICD-10-CM

## 2013-10-06 DIAGNOSIS — R569 Unspecified convulsions: Secondary | ICD-10-CM | POA: Diagnosis present

## 2013-10-06 DIAGNOSIS — I5032 Chronic diastolic (congestive) heart failure: Secondary | ICD-10-CM | POA: Diagnosis present

## 2013-10-06 DIAGNOSIS — I509 Heart failure, unspecified: Secondary | ICD-10-CM | POA: Diagnosis present

## 2013-10-06 DIAGNOSIS — D320 Benign neoplasm of cerebral meninges: Secondary | ICD-10-CM

## 2013-10-06 DIAGNOSIS — Z8249 Family history of ischemic heart disease and other diseases of the circulatory system: Secondary | ICD-10-CM | POA: Diagnosis not present

## 2013-10-06 DIAGNOSIS — C3490 Malignant neoplasm of unspecified part of unspecified bronchus or lung: Secondary | ICD-10-CM | POA: Diagnosis present

## 2013-10-06 DIAGNOSIS — Z7901 Long term (current) use of anticoagulants: Secondary | ICD-10-CM | POA: Diagnosis not present

## 2013-10-06 DIAGNOSIS — G40909 Epilepsy, unspecified, not intractable, without status epilepticus: Secondary | ICD-10-CM | POA: Diagnosis not present

## 2013-10-06 DIAGNOSIS — Z85841 Personal history of malignant neoplasm of brain: Secondary | ICD-10-CM | POA: Diagnosis not present

## 2013-10-06 DIAGNOSIS — N182 Chronic kidney disease, stage 2 (mild): Secondary | ICD-10-CM | POA: Diagnosis present

## 2013-10-06 DIAGNOSIS — D509 Iron deficiency anemia, unspecified: Secondary | ICD-10-CM | POA: Diagnosis present

## 2013-10-06 DIAGNOSIS — E785 Hyperlipidemia, unspecified: Secondary | ICD-10-CM | POA: Diagnosis present

## 2013-10-06 DIAGNOSIS — K921 Melena: Secondary | ICD-10-CM | POA: Diagnosis present

## 2013-10-06 DIAGNOSIS — G9389 Other specified disorders of brain: Secondary | ICD-10-CM | POA: Diagnosis not present

## 2013-10-06 DIAGNOSIS — E1140 Type 2 diabetes mellitus with diabetic neuropathy, unspecified: Secondary | ICD-10-CM | POA: Diagnosis present

## 2013-10-06 DIAGNOSIS — A419 Sepsis, unspecified organism: Secondary | ICD-10-CM | POA: Diagnosis present

## 2013-10-06 DIAGNOSIS — E119 Type 2 diabetes mellitus without complications: Secondary | ICD-10-CM | POA: Diagnosis present

## 2013-10-06 DIAGNOSIS — R739 Hyperglycemia, unspecified: Secondary | ICD-10-CM

## 2013-10-06 DIAGNOSIS — J984 Other disorders of lung: Secondary | ICD-10-CM | POA: Diagnosis not present

## 2013-10-06 DIAGNOSIS — Z7982 Long term (current) use of aspirin: Secondary | ICD-10-CM

## 2013-10-06 DIAGNOSIS — K7689 Other specified diseases of liver: Secondary | ICD-10-CM | POA: Diagnosis not present

## 2013-10-06 DIAGNOSIS — I10 Essential (primary) hypertension: Secondary | ICD-10-CM

## 2013-10-06 DIAGNOSIS — F329 Major depressive disorder, single episode, unspecified: Secondary | ICD-10-CM | POA: Diagnosis present

## 2013-10-06 DIAGNOSIS — R112 Nausea with vomiting, unspecified: Secondary | ICD-10-CM | POA: Diagnosis not present

## 2013-10-06 DIAGNOSIS — E1165 Type 2 diabetes mellitus with hyperglycemia: Secondary | ICD-10-CM

## 2013-10-06 DIAGNOSIS — D649 Anemia, unspecified: Secondary | ICD-10-CM | POA: Diagnosis present

## 2013-10-06 LAB — POCT GASTRIC OCCULT BLOOD (1-CARD TO LAB): Occult Blood, Gastric: POSITIVE — AB

## 2013-10-06 LAB — COMPREHENSIVE METABOLIC PANEL
ALK PHOS: 62 U/L (ref 39–117)
ALT: 7 U/L (ref 0–35)
ANION GAP: 15 (ref 5–15)
AST: 13 U/L (ref 0–37)
Albumin: 3 g/dL — ABNORMAL LOW (ref 3.5–5.2)
BILIRUBIN TOTAL: 0.2 mg/dL — AB (ref 0.3–1.2)
BUN: 15 mg/dL (ref 6–23)
CO2: 21 mEq/L (ref 19–32)
Calcium: 8.3 mg/dL — ABNORMAL LOW (ref 8.4–10.5)
Chloride: 108 mEq/L (ref 96–112)
Creatinine, Ser: 0.82 mg/dL (ref 0.50–1.10)
GFR calc Af Amer: 77 mL/min — ABNORMAL LOW (ref >90)
GFR calc non Af Amer: 67 mL/min — ABNORMAL LOW (ref >90)
Glucose, Bld: 142 mg/dL — ABNORMAL HIGH (ref 70–99)
POTASSIUM: 4 meq/L (ref 3.7–5.3)
Sodium: 144 mEq/L (ref 137–147)
Total Protein: 6.2 g/dL (ref 6.0–8.3)

## 2013-10-06 LAB — RAPID URINE DRUG SCREEN, HOSP PERFORMED
Amphetamines: NOT DETECTED
Barbiturates: NOT DETECTED
Benzodiazepines: NOT DETECTED
Cocaine: NOT DETECTED
OPIATES: NOT DETECTED
Tetrahydrocannabinol: NOT DETECTED

## 2013-10-06 LAB — CBC WITH DIFFERENTIAL/PLATELET
BASOS ABS: 0 10*3/uL (ref 0.0–0.1)
Basophils Relative: 0 % (ref 0–1)
Eosinophils Absolute: 0 10*3/uL (ref 0.0–0.7)
Eosinophils Relative: 1 % (ref 0–5)
HEMATOCRIT: 37.9 % (ref 36.0–46.0)
HEMOGLOBIN: 11.8 g/dL — AB (ref 12.0–15.0)
LYMPHS PCT: 22 % (ref 12–46)
Lymphs Abs: 1.7 10*3/uL (ref 0.7–4.0)
MCH: 24.3 pg — ABNORMAL LOW (ref 26.0–34.0)
MCHC: 31.1 g/dL (ref 30.0–36.0)
MCV: 78.1 fL (ref 78.0–100.0)
MONO ABS: 0.6 10*3/uL (ref 0.1–1.0)
MONOS PCT: 7 % (ref 3–12)
NEUTROS ABS: 5.6 10*3/uL (ref 1.7–7.7)
Neutrophils Relative %: 70 % (ref 43–77)
Platelets: 194 10*3/uL (ref 150–400)
RBC: 4.85 MIL/uL (ref 3.87–5.11)
RDW: 15.9 % — ABNORMAL HIGH (ref 11.5–15.5)
WBC: 7.9 10*3/uL (ref 4.0–10.5)

## 2013-10-06 LAB — I-STAT CG4 LACTIC ACID, ED
LACTIC ACID, VENOUS: 1.26 mmol/L (ref 0.5–2.2)
Lactic Acid, Venous: 2.49 mmol/L — ABNORMAL HIGH (ref 0.5–2.2)

## 2013-10-06 LAB — BASIC METABOLIC PANEL
Anion gap: 14 (ref 5–15)
BUN: 19 mg/dL (ref 6–23)
CALCIUM: 8.9 mg/dL (ref 8.4–10.5)
CHLORIDE: 99 meq/L (ref 96–112)
CO2: 23 mEq/L (ref 19–32)
CREATININE: 1.2 mg/dL — AB (ref 0.50–1.10)
GFR calc non Af Amer: 42 mL/min — ABNORMAL LOW (ref >90)
GFR, EST AFRICAN AMERICAN: 49 mL/min — AB (ref >90)
Glucose, Bld: 360 mg/dL — ABNORMAL HIGH (ref 70–99)
Potassium: 4 mEq/L (ref 3.7–5.3)
Sodium: 136 mEq/L — ABNORMAL LOW (ref 137–147)

## 2013-10-06 LAB — CBC
HEMATOCRIT: 35.7 % — AB (ref 36.0–46.0)
Hemoglobin: 10.8 g/dL — ABNORMAL LOW (ref 12.0–15.0)
MCH: 23.4 pg — ABNORMAL LOW (ref 26.0–34.0)
MCHC: 30.3 g/dL (ref 30.0–36.0)
MCV: 77.4 fL — AB (ref 78.0–100.0)
Platelets: 215 10*3/uL (ref 150–400)
RBC: 4.61 MIL/uL (ref 3.87–5.11)
RDW: 15.9 % — ABNORMAL HIGH (ref 11.5–15.5)
WBC: 6.2 10*3/uL (ref 4.0–10.5)

## 2013-10-06 LAB — URINALYSIS, ROUTINE W REFLEX MICROSCOPIC
Bilirubin Urine: NEGATIVE
GLUCOSE, UA: 250 mg/dL — AB
Ketones, ur: NEGATIVE mg/dL
Nitrite: POSITIVE — AB
Protein, ur: 100 mg/dL — AB
Specific Gravity, Urine: 1.025 (ref 1.005–1.030)
Urobilinogen, UA: 0.2 mg/dL (ref 0.0–1.0)
pH: 6.5 (ref 5.0–8.0)

## 2013-10-06 LAB — TROPONIN I
Troponin I: <0.3 ng/mL (ref <0.30)
Troponin I: <0.3 ng/mL (ref <0.30)

## 2013-10-06 LAB — CALCIUM, IONIZED: Calcium, Ion: 1.07 mmol/L — ABNORMAL LOW (ref 1.13–1.30)

## 2013-10-06 LAB — I-STAT TROPONIN, ED: TROPONIN I, POC: 0 ng/mL (ref 0.00–0.08)

## 2013-10-06 LAB — GLUCOSE, CAPILLARY
GLUCOSE-CAPILLARY: 167 mg/dL — AB (ref 70–99)
Glucose-Capillary: 263 mg/dL — ABNORMAL HIGH (ref 70–99)

## 2013-10-06 LAB — URINE MICROSCOPIC-ADD ON

## 2013-10-06 LAB — CBG MONITORING, ED: Glucose-Capillary: 333 mg/dL — ABNORMAL HIGH (ref 70–99)

## 2013-10-06 LAB — POC OCCULT BLOOD, ED: Fecal Occult Bld: NEGATIVE

## 2013-10-06 LAB — MAGNESIUM: Magnesium: 1.7 mg/dL (ref 1.5–2.5)

## 2013-10-06 LAB — MRSA PCR SCREENING: MRSA by PCR: POSITIVE — AB

## 2013-10-06 LAB — VITAMIN B12: Vitamin B-12: 469 pg/mL (ref 211–911)

## 2013-10-06 LAB — PHOSPHORUS: Phosphorus: 3.2 mg/dL (ref 2.3–4.6)

## 2013-10-06 LAB — TSH: TSH: 1.25 u[IU]/mL (ref 0.350–4.500)

## 2013-10-06 MED ORDER — SODIUM CHLORIDE 0.9 % IV SOLN
1000.0000 mL | INTRAVENOUS | Status: DC
  Administered 2013-10-06: 1000 mL via INTRAVENOUS

## 2013-10-06 MED ORDER — DEXTROSE 5 % IV SOLN
1.0000 g | INTRAVENOUS | Status: DC
  Administered 2013-10-07 – 2013-10-08 (×2): 1 g via INTRAVENOUS
  Filled 2013-10-06 (×3): qty 10

## 2013-10-06 MED ORDER — LEVETIRACETAM IN NACL 500 MG/100ML IV SOLN
500.0000 mg | Freq: Two times a day (BID) | INTRAVENOUS | Status: DC
  Filled 2013-10-06: qty 100

## 2013-10-06 MED ORDER — ONDANSETRON HCL 4 MG/2ML IJ SOLN
4.0000 mg | Freq: Once | INTRAMUSCULAR | Status: AC
  Administered 2013-10-06: 4 mg via INTRAVENOUS
  Filled 2013-10-06: qty 2

## 2013-10-06 MED ORDER — DEXTROSE 5 % IV SOLN
2.0000 g | Freq: Once | INTRAVENOUS | Status: AC
  Administered 2013-10-06: 2 g via INTRAVENOUS
  Filled 2013-10-06: qty 2

## 2013-10-06 MED ORDER — SODIUM CHLORIDE 0.9 % IV SOLN
INTRAVENOUS | Status: AC
  Administered 2013-10-06: 11:00:00 via INTRAVENOUS

## 2013-10-06 MED ORDER — HYDRALAZINE HCL 20 MG/ML IJ SOLN
5.0000 mg | Freq: Once | INTRAMUSCULAR | Status: AC
  Administered 2013-10-06: 5 mg via INTRAVENOUS
  Filled 2013-10-06: qty 1

## 2013-10-06 MED ORDER — SODIUM CHLORIDE 0.9 % IV BOLUS (SEPSIS)
30.0000 mL/kg | Freq: Once | INTRAVENOUS | Status: AC
  Administered 2013-10-06: 2313 mL via INTRAVENOUS

## 2013-10-06 MED ORDER — PANTOPRAZOLE SODIUM 40 MG IV SOLR
40.0000 mg | INTRAVENOUS | Status: DC
  Administered 2013-10-06 – 2013-10-08 (×3): 40 mg via INTRAVENOUS
  Filled 2013-10-06 (×4): qty 40

## 2013-10-06 MED ORDER — SODIUM CHLORIDE 0.9 % IV SOLN
750.0000 mg | Freq: Two times a day (BID) | INTRAVENOUS | Status: DC
  Administered 2013-10-06: 750 mg via INTRAVENOUS
  Filled 2013-10-06 (×3): qty 7.5

## 2013-10-06 MED ORDER — SODIUM CHLORIDE 0.9 % IV BOLUS (SEPSIS)
500.0000 mL | Freq: Once | INTRAVENOUS | Status: AC
  Administered 2013-10-06: 500 mL via INTRAVENOUS

## 2013-10-06 MED ORDER — LEVETIRACETAM IN NACL 1000 MG/100ML IV SOLN
1000.0000 mg | Freq: Once | INTRAVENOUS | Status: AC
  Administered 2013-10-06: 1000 mg via INTRAVENOUS
  Filled 2013-10-06: qty 100

## 2013-10-06 MED ORDER — MUPIROCIN 2 % EX OINT
1.0000 "application " | TOPICAL_OINTMENT | Freq: Two times a day (BID) | CUTANEOUS | Status: DC
  Administered 2013-10-06 – 2013-10-08 (×4): 1 via NASAL
  Filled 2013-10-06: qty 22

## 2013-10-06 MED ORDER — LEVETIRACETAM IN NACL 1000 MG/100ML IV SOLN
1000.0000 mg | Freq: Two times a day (BID) | INTRAVENOUS | Status: DC
  Filled 2013-10-06: qty 100

## 2013-10-06 MED ORDER — SODIUM CHLORIDE 0.9 % IJ SOLN
3.0000 mL | Freq: Two times a day (BID) | INTRAMUSCULAR | Status: DC
  Administered 2013-10-06 – 2013-10-08 (×4): 3 mL via INTRAVENOUS

## 2013-10-06 MED ORDER — CHLORHEXIDINE GLUCONATE CLOTH 2 % EX PADS
6.0000 | MEDICATED_PAD | Freq: Every day | CUTANEOUS | Status: DC
  Administered 2013-10-07 – 2013-10-08 (×2): 6 via TOPICAL

## 2013-10-06 MED ORDER — SODIUM CHLORIDE 0.9 % IV SOLN
INTRAVENOUS | Status: DC
  Administered 2013-10-06 – 2013-10-07 (×2): via INTRAVENOUS

## 2013-10-06 MED ORDER — ONDANSETRON HCL 4 MG/2ML IJ SOLN: 4.0000 mg | Freq: Four times a day (QID) | INTRAMUSCULAR | Status: DC | PRN

## 2013-10-06 MED ORDER — SODIUM CHLORIDE 0.9 % IV SOLN: 1000.0000 mL | INTRAVENOUS | Status: DC

## 2013-10-06 MED ORDER — CARVEDILOL 12.5 MG PO TABS
12.5000 mg | ORAL_TABLET | Freq: Once | ORAL | Status: AC
  Administered 2013-10-06: 12.5 mg via ORAL
  Filled 2013-10-06: qty 1

## 2013-10-06 MED ORDER — INSULIN ASPART 100 UNIT/ML ~~LOC~~ SOLN
0.0000 [IU] | Freq: Three times a day (TID) | SUBCUTANEOUS | Status: DC
  Administered 2013-10-06: 3 [IU] via SUBCUTANEOUS
  Administered 2013-10-07: 8 [IU] via SUBCUTANEOUS
  Administered 2013-10-07 (×2): 5 [IU] via SUBCUTANEOUS
  Administered 2013-10-08: 3 [IU] via SUBCUTANEOUS
  Administered 2013-10-08: 5 [IU] via SUBCUTANEOUS

## 2013-10-06 MED ORDER — ONDANSETRON HCL 4 MG PO TABS: 4.0000 mg | ORAL_TABLET | Freq: Four times a day (QID) | ORAL | Status: DC | PRN

## 2013-10-06 NOTE — Care Management Note (Signed)
    Page 1 of 1   10/06/2013     1:14:43 PM CARE MANAGEMENT NOTE 10/06/2013  Patient:  [name], [name]   Account Number:  [id number]  Date Initiated:  10/06/2013  Documentation initiated by:  Elissa Hefty  Subjective/Objective Assessment:   adm w sepsis     Action/Plan:   lives w da, pcp dr s [name]   Anticipated DC Date:     Anticipated DC Plan:           Choice offered to / List presented to:             Status of service:   Medicare Important Message given?   (If response is "NO", the following Medicare IM given date fields will be blank) Date Medicare IM given:   Medicare IM given by:   Date Additional Medicare IM given:   Additional Medicare IM given by:    Discharge Disposition:    Per UR Regulation:  Reviewed for med. necessity/level of care/duration of stay  If discussed at [facility] of Stay Meetings, dates discussed:    Comments:

## 2013-10-06 NOTE — Evaluation (Signed)
Physical Therapy Evaluation Patient Details Name: [name] MRN: [id number] DOB: 1933-03-24 Today's Date: 10/06/2013   History of Present Illness  Pt is a 80 y/o female admitted s/p witnesed seizure by EMS. PMH includes seizure 02/25/2013. Underwent resection of 8cm R grade 2 meningioma 03/02/2013, history of CVA in 2006.  Clinical Impression  Pt admitted with the above. Pt currently with functional limitations due to the deficits listed below (see PT Problem List). At the time of PT eval pt moving very slowly with mobility and requiring occasional assist for safety. Tolerance for functional activity is low. Pt will benefit from skilled PT to increase their independence and safety with mobility to allow discharge to the venue listed below. Feel pt is appropriate for d/c home with HHPT to follow if 24 hour supervision is available. If pt will not have 24 hour support, may want to consider STR at the SNF level for continued strengthening and to increase tolerance for functional activity, to decrease risk of falls.      Follow Up Recommendations Home health PT;Supervision for mobility/OOB    Equipment Recommendations  None recommended by PT    Recommendations for Other Services       Precautions / Restrictions Precautions Precautions: Fall Restrictions Weight Bearing Restrictions: No      Mobility  Bed Mobility Overal bed mobility: Needs Assistance Bed Mobility: Supine to Sit     Supine to sit: Min guard     General bed mobility comments: Occasional min guard as pt elevates trunk to full sitting position.   Transfers Overall transfer level: Needs assistance Equipment used: Rolling walker (2 wheeled) Transfers: Sit to/from Stand Sit to Stand: Min assist         General transfer comment: VC's for hand placement on seated surface for safety.   Ambulation/Gait Ambulation/Gait assistance: Min guard Ambulation Distance (Feet): 45 Feet Assistive device: Rolling walker (2  wheeled) Gait Pattern/deviations: Step-through pattern;Decreased stride length;Trendelenburg Gait velocity: Decreased Gait velocity interpretation: Below normal speed for age/gender General Gait Details: Pt moving slowly and deliberately. States she is tired after 45 feet and wants to sit down. During this time pt does not appear SOB or strained for movement.  Stairs            Wheelchair Mobility    Modified Rankin (Stroke Patients Only)       Balance Overall balance assessment: Needs assistance Sitting-balance support: Feet supported;No upper extremity supported Sitting balance-Leahy Scale: Good Sitting balance - Comments: Pt able to perform dynamic sitting to don 1 sock. Took a very long time to do so, and therapist donned the other sock for her.    Standing balance support: Bilateral upper extremity supported Standing balance-Leahy Scale: Fair Standing balance comment: Feel pt can stand short periods of time without UE assist, however for any dynamic activity pt will require UE assist.                              Pertinent Vitals/Pain Pain Assessment: No/denies pain    Home Living Family/patient expects to be discharged to:: Private residence Living Arrangements: Children Available Help at Discharge: Family;Available 24 hours/day Type of Home: House Home Access: Stairs to enter Entrance Stairs-Rails: Right Entrance Stairs-Number of Steps: 5 Home Layout: One level Home Equipment: Walker - 2 wheels;Bedside commode;Cane - single point      Prior Function Level of Independence: Needs assistance   Gait / Transfers Assistance Needed: Uses  RW for ambulation  ADL's / Homemaking Assistance Needed: Pt reports that her granddaughter assists her with ADL's.         Hand Dominance   Dominant Hand: Left    Extremity/Trunk Assessment   Upper Extremity Assessment: Defer to OT evaluation           Lower Extremity Assessment: Generalized weakness       Cervical / Trunk Assessment: Kyphotic  Communication   Communication: No difficulties;HOH  Cognition Arousal/Alertness: Lethargic Behavior During Therapy: Flat affect Overall Cognitive Status: No family/caregiver present to determine baseline cognitive functioning                      General Comments General comments (skin integrity, edema, etc.): Pt very quiet during session and does not say much. Will answer questions with one word answers mostly.     Exercises        Assessment/Plan    PT Assessment Patient needs continued PT services  PT Diagnosis Difficulty walking;Generalized weakness   PT Problem List Decreased strength;Decreased range of motion;Decreased activity tolerance;Decreased balance;Decreased mobility;Decreased knowledge of use of DME;Decreased safety awareness;Decreased knowledge of precautions  PT Treatment Interventions DME instruction;Gait training;Stair training;Functional mobility training;Therapeutic activities;Therapeutic exercise;Neuromuscular re-education;Patient/family education   PT Goals (Current goals can be found in the Care Plan section) Acute Rehab PT Goals Patient Stated Goal: To return home PT Goal Formulation: With patient Time For Goal Achievement: 10/13/13 Potential to Achieve Goals: Fair    Frequency Min 3X/week   Barriers to discharge Decreased caregiver support Unclear how much assist pt has available to her at home. Pt states she has 24 hour assist but would like confirmation from family.     Co-evaluation               End of Session Equipment Utilized During Treatment: Gait belt Activity Tolerance: Patient limited by fatigue Patient left: in chair;with call bell/phone within reach Nurse Communication: Mobility status         Time: 9480-1655 PT Time Calculation (min): 22 min   Charges:   PT Evaluation $Initial PT Evaluation Tier I: 1 Procedure PT Treatments $Gait Training: 8-22 mins   PT G CodesJolyn Lent 10/06/2013, 5:07 PM  Jolyn Lent, PT, DPT Acute Rehabilitation Services Pager: [phone]

## 2013-10-06 NOTE — ED Notes (Signed)
125cc of urine output since foley placement. Admitting doctor aware.

## 2013-10-06 NOTE — Discharge Instructions (Addendum)
It was a pleasure taking care of you. You were admitted to the hospital with seizure and urinary tract infection. We have treated you for both and gave you fluid to get your blood pressure high since it was low. You are now stable and expected to do well at home with antibiotics. Please make sure you drink plenty of water so you don't get dehydrated as you were dehydrated when you came in.    You should keep taking amoxicillin for 14 days (three times daily) for your urinary tract infection and bacteremia. Urinary Tract Infection Urinary tract infections (UTIs) can develop anywhere along your urinary tract. Your urinary tract is your body's drainage system for removing wastes and extra water. Your urinary tract includes two kidneys, two ureters, a bladder, and a urethra. Your kidneys are a pair of bean-shaped organs. Each kidney is about the size of your fist. They are located below your ribs, one on each side of your spine. CAUSES Infections are caused by microbes, which are microscopic organisms, including fungi, viruses, and bacteria. These organisms are so small that they can only be seen through a microscope. Bacteria are the microbes that most commonly cause UTIs. SYMPTOMS  Symptoms of UTIs may vary by age and gender of the patient and by the location of the infection. Symptoms in young women typically include a frequent and intense urge to urinate and a painful, burning feeling in the bladder or urethra during urination. Older women and men are more likely to be tired, shaky, and weak and have muscle aches and abdominal pain. A fever may mean the infection is in your kidneys. Other symptoms of a kidney infection include pain in your back or sides below the ribs, nausea, and vomiting. DIAGNOSIS To diagnose a UTI, your caregiver will ask you about your symptoms. Your caregiver also will ask to provide a urine sample. The urine sample will be tested for bacteria and white blood cells. White blood  cells are made by your body to help fight infection. TREATMENT  Typically, UTIs can be treated with medication. Because most UTIs are caused by a bacterial infection, they usually can be treated with the use of antibiotics. The choice of antibiotic and length of treatment depend on your symptoms and the type of bacteria causing your infection. HOME CARE INSTRUCTIONS  If you were prescribed antibiotics, take them exactly as your caregiver instructs you. Finish the medication even if you feel better after you have only taken some of the medication.  Drink enough water and fluids to keep your urine clear or pale yellow.  Avoid caffeine, tea, and carbonated beverages. They tend to irritate your bladder.  Empty your bladder often. Avoid holding urine for long periods of time.  Empty your bladder before and after sexual intercourse.  After a bowel movement, women should cleanse from front to back. Use each tissue only once. SEEK MEDICAL CARE IF:   You have back pain.  You develop a fever.  Your symptoms do not begin to resolve within 3 days. SEEK IMMEDIATE MEDICAL CARE IF:   You have severe back pain or lower abdominal pain.  You develop chills.  You have nausea or vomiting.  You have continued burning or discomfort with urination. MAKE SURE YOU:   Understand these instructions.  Will watch your condition.  Will get help right away if you are not doing well or get worse. Document Released: 11/12/2004 Document Revised: 08/04/2011 Document Reviewed: 03/13/2011 [facility] Patient Information 2015 [place], [place]. This  information is not intended to replace advice given to you by your health care provider. Make sure you discuss any questions you have with your health care provider. ° °

## 2013-10-06 NOTE — ED Notes (Signed)
Attempted to start second IV, unsuccessful. Will page IV team when pt returns from EEG.

## 2013-10-06 NOTE — Consult Note (Signed)
ANTIBIOTIC CONSULT NOTE - INITIAL  Pharmacy Consult for Ceftriaxone Indication: UTI  No Known Allergies  Patient Measurements: Height: 5\' 4"  (162.6 cm) Weight: 170 lb (77.111 kg) IBW/kg (Calculated) : 54.7  Vital Signs: Temp: 98.7 F (37.1 C) (08/21 0130) Temp src: Rectal (08/21 0130) BP: 113/81 mmHg (08/21 1130) Pulse Rate: 72 (08/21 1130) Intake/Output from previous day: 08/20 0701 - 08/21 0700 In: -  Out: 50 [Urine:50] Intake/Output from this shift: Total I/O In: -  Out: 75 [Urine:75]  Labs:  Recent Labs  10/06/13 0105 10/06/13 0530  WBC  --  7.9  HGB  --  11.8*  PLT  --  194  CREATININE 1.20*  --    Estimated Creatinine Clearance: 38.9 ml/min (by C-G formula based on Cr of 1.2).  Microbiology: No results found for this or any previous visit (from the past 720 hour(s)).  Medical History: Past Medical History  Diagnosis Date  . Diabetes mellitus 2007    HgA1C (02/20/2010) = 9.2, HgA1C (03/20/2009) = 12.1  . CVA (cerebrovascular accident) 4388     right embolic stroke, no residual deficits  . Diverticulitis   . CVA (cerebral infarction) 7-yrs ago  . Arthritis   . Dysphagia   . CHF (congestive heart failure)   . DIABETES MELLITUS, TYPE II 12/04/2005  . HYPERLIPIDEMIA 12/04/2005  . HYPERTENSION 12/04/2005  . GERD 12/04/2005  . ARTHRITIS, KNEE 03/24/2006  . Lung mass 07/08/2010  . Meningioma 07/21/2010  . Hemangioma of liver 12/02/2010  . Depression 12/02/2010  . Adenocarcinoma of lung      Right upper lobe adenocarcinoma. s/p right lower lobectomy 12/15/10  . Brain cancer     7 cm cerebral right atypical meningoma grade II   Assessment: 80yof presenting with dysuria and increased urinary frequency. UA is dirty. She will begin ceftriaxone for UTI. Urine culture pending.   Goal of Therapy:  Eradication of infection  Plan:  1) Ceftriaxone 1g IV q24 2) Follow up urine culture  Deboraha Sprang 10/06/2013,12:26 PM

## 2013-10-06 NOTE — Evaluation (Signed)
Clinical/Bedside Swallow Evaluation Patient Details  Name: [name] MRN: [id number] Date of Birth: [birth year]  Today's Date: 10/06/2013 Time: 4098-1191 SLP Time Calculation (min): 12 min  Past Medical History:  Past Medical History  Diagnosis Date  . Diabetes mellitus 2007    HgA1C (02/20/2010) = 9.2, HgA1C (03/20/2009) = 12.1  . CVA (cerebrovascular accident) 2006     right embolic stroke, no residual deficits  . Diverticulitis   . CVA (cerebral infarction) 7-yrs ago  . Arthritis   . Dysphagia   . CHF (congestive heart failure)   . DIABETES MELLITUS, TYPE II 12/04/2005  . HYPERLIPIDEMIA 12/04/2005  . HYPERTENSION 12/04/2005  . GERD 12/04/2005  . ARTHRITIS, KNEE 03/24/2006  . Lung mass 07/08/2010  . Meningioma 07/21/2010  . Hemangioma of liver 12/02/2010  . Depression 12/02/2010  . Adenocarcinoma of lung      Right upper lobe adenocarcinoma. s/p right lower lobectomy 12/15/10  . Brain cancer     7 cm cerebral right atypical meningoma grade II   Past Surgical History:  Past Surgical History  Procedure Laterality Date  . Abdominal hysterectomy    . Video bronchoscope.  12/29/2007    [place]  . Wide excision of left upper back mass.    . Extracapsular cataract extraction with intraocular      lens implantation.  . Right vats,right thoracotomy,right lower lobectomy with node dissection    . Incision and drainage perirectal abscess N/A 01/19/2013    Procedure: IRRIGATION AND DEBRIDEMENT PERIRECTAL ABSCESS;  Surgeon: Liz Malady, MD;  Location: [facility] OR;  Service: General;  Laterality: N/A;  . Craniotomy N/A 03/02/2013    Procedure: CRANIOTOMY TUMOR EXCISION;  Surgeon: Carmela Hurt, MD;  Location: [facility];  Service: Neurosurgery;  Laterality: N/A;  Bifrontal Craniotomy for tumor   HPI:  80 yo female with hx of seizures, CHF, HTN, DM2 here after witnessed seizure by EMS, found to have UTI and AKI.  Pt underwent resection of meningioma 03/02/2013; history of CVA in  2006.  Had clinical swallow evaluation 02/25/13 with results indicating normal swallow function; no f/u was recommended.    Assessment / Plan / Recommendation Clinical Impression  Pt presents with normal oropharyngeal swallow with adequate mastication, swift swallow trigger, and no overt s/s of aspiration.  Pt is able to feed herslef without difficulty.  Recommend regular diet consistency, thin liquids, meds whole with water.  No f/u recommended.             Diet Recommendation Regular;Thin liquid   Liquid Administration via: Cup;Straw Medication Administration: Whole meds with liquid Supervision: Patient able to self feed    Other  Recommendations Oral Care Recommendations: Oral care BID   Follow Up Recommendations  None           SLP Swallow Goals   n/a  Swallow Study Prior Functional Status  Type of Home: House Available Help at Discharge: Family;Available 24 hours/day    General HPI: 80 yo female with hx of seizures, CHF, HTN, DM2 here after witnessed seizure by EMS, found to have UTI and AKI.  Pt underwent resection of meningioma 03/02/2013; history of CVA in 2006.  Had clinical swallow evaluation 02/25/13 with results indicating normal swallow function; no f/u was recommended.  Type of Study: Bedside swallow evaluation Diet Prior to this Study: NPO Temperature Spikes Noted: No Respiratory Status: Room air History of Recent Intubation: No Behavior/Cognition: Alert;Cooperative Oral Cavity - Dentition: Missing dentition Self-Feeding Abilities: Able to feed self Patient  Positioning: Upright in chair Baseline Vocal Quality: Clear Volitional Cough: Strong Volitional Swallow: Able to elicit    Oral/Motor/Sensory Function Overall Oral Motor/Sensory Function: Appears within functional limits for tasks assessed   Ice Chips Ice chips: Within functional limits Presentation: Spoon   Thin Liquid Thin Liquid: Within functional limits Presentation: Cup;Self Fed    Nectar Thick  Nectar Thick Liquid: Not tested   Honey Thick Honey Thick Liquid: Not tested   Puree Puree: Within functional limits Presentation: Self Fed;Spoon   Solid   [name], [place] CCC/SLP Pager [phone]     Solid: Within functional limits Presentation: Self Fed       [name] 10/06/2013,3:27 PM

## 2013-10-06 NOTE — Procedures (Signed)
ELECTROENCEPHALOGRAM REPORT  Patient: [name]       Room #: 7N17  EEG No. ID: [id number] Age: 80 y.o.        Sex: female Referring Physician: Dr. Larey Dresser Report Date:  10/06/2013        Interpreting Physician: Anthony Sar  History: [name] is an 80 y.o. female with a history of seizure and meningioma resection in January 2015 as well as history of stroke in 2006, CHF, hypertension and diabetes mellitus. Patient hasn't been following a witnessed generalized seizure.  Indications for study:  Rule out encephalopathy; rule out seizure activity.  Technique: This is an 18 channel routine scalp EEG performed at the bedside with bipolar and monopolar montages arranged in accordance to the international 10/20 system of electrode placement.   Description: This EEG recording was performed during drowsiness. Patient was also noted to be somewhat confused and agitated and actually removed EEG leads toward the end of the recording. The predominant background activity consisted of low amplitude 1-2 Hz diffuse irregular delta activity with superimposed diffuse faster activity in the 7-8 Hz range. Photic stimulation produced a minimal occipital driving response bilaterally. Hyperventilation was not performed. No epileptiform discharges were recorded.  Interpretation: This EEG is abnormal with moderately severe continuous generalized nonspecific slowing of cerebral activity, which can be seen with a wide variety of encephalopathic abnormalities, including toxic and metabolic encephalopathy as well as degenerative disorders. No evidence of epileptiform activity was seen.   Rush Farmer M.D. [facility] [phone]

## 2013-10-06 NOTE — Progress Notes (Signed)
Subjective: Patient was briefly seen. She was going to EEG. Denies any pain or any complaints. Per nursing, continues to have low UOP (175 total).   Objective: Vital signs in last 24 hours: Filed Vitals:   10/06/13 0845 10/06/13 0900 10/06/13 1054 10/06/13 1100  BP: 141/50 144/56 168/66 166/64  Pulse: 68 67 69 71  Temp:      TempSrc:      Resp:   13 15  Height:      Weight:      SpO2: 99% 100% 100% 99%   Weight change:   Intake/Output Summary (Last 24 hours) at 10/06/13 1121 Last data filed at 10/06/13 0940  Gross per 24 hour  Intake      0 ml  Output    125 ml  Net   -125 ml   Vitals reviewed. General: resting in bed, NAD HEENT: PERRL, EOMI, no scleral icterus Cardiac: RRR, no rubs, murmurs or gallops Pulm: clear to auscultation bilaterally, no wheezes, rales, or rhonchi Abd: soft, nontender, nondistended, BS present Ext: warm and well perfused, no pedal edema Neuro: alert and oriented X3, cranial nerves II-XII grossly intact, strength and sensation to light touch equal in bilateral upper and lower extremities. Cognition deficit noted when asked to spell "world" backward and "serial of 7's". Repetition is also slow.   Lab Results: Basic Metabolic Panel:  Recent Labs Lab 10/06/13 0105  NA 136*  K 4.0  CL 99  CO2 23  GLUCOSE 360*  BUN 19  CREATININE 1.20*  CALCIUM 8.9   Liver Function Tests: No results found for this basename: AST, ALT, ALKPHOS, BILITOT, PROT, ALBUMIN,  in the last 168 hours No results found for this basename: LIPASE, AMYLASE,  in the last 168 hours No results found for this basename: AMMONIA,  in the last 168 hours CBC:  Recent Labs Lab 10/06/13 0530  WBC 7.9  NEUTROABS 5.6  HGB 11.8*  HCT 37.9  MCV 78.1  PLT 194   Cardiac Enzymes: No results found for this basename: CKTOTAL, CKMB, CKMBINDEX, TROPONINI,  in the last 168 hours BNP: No results found for this basename: PROBNP,  in the last 168 hours D-Dimer: No results found for  this basename: DDIMER,  in the last 168 hours CBG:  Recent Labs Lab 10/06/13 0107  GLUCAP 333*   Hemoglobin A1C: No results found for this basename: HGBA1C,  in the last 168 hours Fasting Lipid Panel: No results found for this basename: CHOL, HDL, LDLCALC, TRIG, CHOLHDL, LDLDIRECT,  in the last 168 hours Thyroid Function Tests: No results found for this basename: TSH, T4TOTAL, FREET4, T3FREE, THYROIDAB,  in the last 168 hours Coagulation: No results found for this basename: LABPROT, INR,  in the last 168 hours Anemia Panel: No results found for this basename: VITAMINB12, FOLATE, FERRITIN, TIBC, IRON, RETICCTPCT,  in the last 168 hours Urine Drug Screen: Drugs of Abuse     Component Value Date/Time   LABOPIA NONE DETECTED 10/06/2013 0136   LABOPIA PPS 06/27/2012 1645   COCAINSCRNUR NONE DETECTED 10/06/2013 0136   COCAINSCRNUR NEG 06/27/2012 1645   LABBENZ NONE DETECTED 10/06/2013 0136   LABBENZ PPS 06/27/2012 1645   LABBENZ NEG 07/02/2006 2021   AMPHETMU NONE DETECTED 10/06/2013 0136   AMPHETMU NEG 07/02/2006 2021   THCU NONE DETECTED 10/06/2013 0136   LABBARB NONE DETECTED 10/06/2013 0136   LABBARB NEG 06/27/2012 1645    Alcohol Level: No results found for this basename: ETH,  in the last 168  hours Urinalysis:  Recent Labs Lab 10/06/13 0136  COLORURINE YELLOW  LABSPEC 1.025  PHURINE 6.5  GLUCOSEU 250*  HGBUR LARGE*  BILIRUBINUR NEGATIVE  KETONESUR NEGATIVE  PROTEINUR 100*  UROBILINOGEN 0.2  NITRITE POSITIVE*  LEUKOCYTESUR MODERATE*   Misc. Labs:  Micro Results: No results found for this or any previous visit (from the past 240 hour(s)). Studies/Results: Ct Head Wo Contrast  10/06/2013   CLINICAL DATA:  Seizure, confusion. Status post meningioma resection.  EXAM: CT HEAD WITHOUT CONTRAST  TECHNIQUE: Contiguous axial images were obtained from the base of the skull through the vertex without intravenous contrast.  COMPARISON:  MRI of the brain September 08, 2013 and CT of the  head August 01, 2013  FINDINGS: Status post bifrontal craniotomy for reported resection of meningioma. Right frontal transcortical cystic encephalomalacia with mild ex vacuo dilatation of frontal horn of the right lateral ventricle, unchanged. Stable appearance of the ventricles, no hydrocephalus. Stable appearance the right posterior basal ganglia/internal capsule cystic lacunar infarct. No intraparenchymal hemorrhage, mass effect, midline shift or acute large vascular territory infarct. Next  No abnormal extra-axial fluid collections. Moderate calcific atherosclerosis of the carotid siphons.  No skull fracture. Mild paranasal sinus mucosal thickening without air-fluid levels, frothy secretions in right frontal sinus. Mastoid air cells are well aerated. Mild temporomandibular osteoarthrosis.  IMPRESSION: No acute intracranial process.  Bifrontal craniotomy, with similar underlying right frontal encephalomalacia.  Remote right basal ganglia/internal capsule lacunar infarct.  Mild acute on chronic paranasal sinusitis.   Electronically Signed   By: Elon Alas   On: 10/06/2013 03:41   Dg Chest Port 1 View  10/06/2013   CLINICAL DATA:  Seizure and hematemesis  EXAM: PORTABLE CHEST - 1 VIEW  COMPARISON:  10/06/2013  FINDINGS: There is mild cardiomegaly which is stable from prior. Stable mild aortic tortuosity. There is chronic right perihilar bandlike opacity which correlates with atelectasis or scar on previous imaging. There are chain sutures also noted in this region. There is no edema, consolidation, effusion, or pneumothorax.  IMPRESSION: 1. No acute findings. 2. Right perihilar scarring in the region of wedge resection.   Electronically Signed   By: Jorje Guild M.D.   On: 10/06/2013 05:04   Dg Chest Port 1 View  10/06/2013   CLINICAL DATA:  Seizure.  EXAM: PORTABLE CHEST - 1 VIEW  COMPARISON:  04/05/2013.  FINDINGS: The heart is borderline enlarged but stable. The mediastinal and hilar contours are  unchanged. There is tortuosity of the thoracic aorta. The lungs are clear except for streaky left basilar atelectasis. No effusion or edema. The bony thorax is intact.  IMPRESSION: Streaky left basilar atelectasis but no infiltrates or effusions.   Electronically Signed   By: Kalman Jewels M.D.   On: 10/06/2013 01:16   Medications: I have reviewed the patient's current medications. Scheduled Meds: . insulin aspart  0-15 Units Subcutaneous TID WC  . levETIRAcetam  500 mg Intravenous Q12H  . pantoprazole (PROTONIX) IV  40 mg Intravenous Q24H   Continuous Infusions: . sodium chloride 150 mL/hr at 10/06/13 1055  . sodium chloride 1,000 mL (10/06/13 0537)   PRN Meds:.  80 yo female with hx of seizures, CHF, HTN, DM2 here after witnessed seizure by EMS, found to have UTI and AKI.   Assessment/Plan:  Seizure: witnessed seizure by EMS. Per ED, patient was nonverbal - possibly post ictal state. History of seizure 02/25/2013. Underwent resection of 8cm R grade 2 meningioma 03/02/2013. Also history of CVA in 2006. CT  head without acute intracranial process. Seizure today may have been 2/2 infection but also has risk from previous brain tumor possible. UDS negative. Spoke to Dr. Nicole Kindred and Dr. Leonel Ramsay neuro.  -IV keppra 1g loading and then 750mg  BID IV for now until SLP eval done. If safe, can do PO. Was keppra 500mg  bid at home. -EEG pending read -Outpatient follow up with radiation oncology  -If patient has recurrent seizure, reconsult neurology   Hemetemesis, melena: on Xarelto for moderate middle lobe and lower lobe PE on CTA chest 04/06/2013, which was provoked from recent surgery.  On iron supplementation. Hemoccult negative. History of GERD on Protonix 20mg  daily. Differential includes gastritis, esophagitis, PUD, AVM. BUN wnl.  -Hgb 11. Will repeat CBC after fluids.  -Consider GI consult  -Xarelto should be discontinued given 6 months of treatment for provoked PE.  -continue Protonix    -NPO until SLP eval.  UTI: In the ED, pt was 98.15F, RR 20, HR 74, BP 81/45. She was given 2L NS bolus and her BP was in 110's, dropped down to 110's now. positive nitrite, moderate leukocytes, many squamous epithelium, many bacteria. She was given 2g ceftriaxone in the ED.  -continue ceftriaxone per pharm. -follow up urine cx   AKI on CKD stage 2: On lasix 20mg  daily and lisinopril 5mg  daily. Possibly prerenal as patient was initially hypotensive and responded to fluid bolus.  -Hold lasix and lisinopril   - Bolus 575ml1x more since BP still 110's. Also on IVF 100cc/hr. - repeat BMP/CBC tomorrow.  CHF: LV EF 69-79%, grade 1 diastolic dysfunction. Does not appear to be in acute exacerbation as she denies SOB and her minimal LE edema is stable. CXR with no effusions. EKG with nonspecific ST changes.  -trend troponins  -held coreg 25mg  BID  -held ASA 81mg  daily  -held simvastatin 20mg  daily   HTN: Initially hypotensive but responded to fluid bolus. Now normotensive with fluid support. -held Coreg, Lasix and lisinopril  - Bolus 558ml1x more since BP still 110's. Also on IVF 150cc/hr.  DM2: last hgb A1c 03/28/2013 9.7. On Novolog SSI and Lantus 21u QHS  -Accuchecks QID, SSI  -Recheck hgb A1c   FEN:  -NPO  -NS@150    DVT ppx: SCDs   Dispo: Disposition is deferred at this time, awaiting improvement of current medical problems.  Anticipated discharge in approximately 2-3 day(s).   The patient does have a current PCP Wilber Oliphant, MD) and does need an [facility] hospital follow-up appointment after discharge.  The patient does not know have transportation limitations that hinder transportation to clinic appointments.  .Services Needed at time of discharge: Y = Yes, Blank = No PT:   OT:   RN:   Equipment:   Other:     LOS: 0 days   Dellia Nims, MD 10/06/2013, 11:21 AM

## 2013-10-06 NOTE — H&P (Signed)
Date: 10/06/2013               Patient Name:  [name] MRN: [id number]  DOB: 20-Apr-1933 Age / Sex: 80 y.o., female   PCP: Wilber Oliphant, MD         Medical Service: Internal Medicine Teaching Service         Attending Physician: Dr. Bartholomew Crews, MD    First Contact: Dr. Genene Churn Pager: [phone]  Second Contact: Dr. Hayes Ludwig Pager: [phone]       After Hours (After 5p/  First Contact Pager: [phone]  weekends / holidays): Second Contact Pager: [phone]   Chief Complaint: seizure  History of Present Illness: [name] is a 80 year old woman with history of seizure 02/25/2013, 8cm R grade 2 meningioma s/p resection 03/02/2013, CVA without residual deficits in 2006, CHF, HTN, GERD, DM2, RUL adenoCA s/p resection 2012, PE 04/06/2013 on Xarelto presenting with seizure witnessed by EMS. She does not remember the event and does not believe she had loss of consciousness. She reports chills, frontal HA x1 week, hemetemesis x 1 en route to hospital, melena since discharge from hospitalization, dysuria, increased urinary frequency, and bilateral LE weakness. Per daughter, patient has incontinence of urine - thought to be purposeful. She also reports that the patient appears to be apathetic. She denies fevers, chest pain, SOB, abdominal pain, diarrhea, hematochezia, suprapubic pain, LH, dizziness, paresthesias, LE edema.  In the ED, pt was 98.36F, RR 20, HR 74, BP 81/45. She was given 2L NS bolus and her BP increased to 118/76. UA had positive nitrite, moderate leukocytes, many squamous epithelium, many bacteria. She was given 2g ceftriaxone.  Meds: Current Facility-Administered Medications  Medication Dose Route Frequency Provider Last Rate Last Dose  . 0.9 %  sodium chloride infusion  1,000 mL Intravenous Continuous Richarda Blade, MD       Current Outpatient Prescriptions  Medication Sig Dispense Refill  . aspirin 81 MG tablet Take 81 mg by mouth daily.       . carvedilol (COREG) 25  MG tablet Take 1 tablet (25 mg total) by mouth 2 (two) times daily with a meal.  180 tablet  4  . furosemide (LASIX) 20 MG tablet Take 1 tablet (20 mg total) by mouth daily.  90 tablet  3  . insulin aspart (NOVOLOG FLEXPEN) 100 UNIT/ML FlexPen Take three times with meals  <70    2 units if going to eat- skip if going to skip meal   71-140    4 units Novolog   141-210  6 units Novolog   211-280  8 units  15 mL  11  . iron polysaccharides (NIFEREX) 150 MG capsule Take 1 capsule (150 mg total) by mouth daily.  30 capsule  6  . levETIRAcetam (KEPPRA) 500 MG tablet Take 500 mg by mouth 2 (two) times daily.      Marland Kitchen lisinopril (PRINIVIL,ZESTRIL) 5 MG tablet Take 1 tablet (5 mg total) by mouth daily.  30 tablet  5  . pantoprazole (PROTONIX) 20 MG tablet Take 20 mg by mouth daily.      . rivaroxaban (XARELTO) 20 MG TABS tablet Take 1 tablet (20 mg total) by mouth daily with supper.  30 tablet  5  . simvastatin (ZOCOR) 20 MG tablet Take 20 mg by mouth daily.      Marland Kitchen albuterol (PROVENTIL HFA;VENTOLIN HFA) 108 (90 BASE) MCG/ACT inhaler Inhale 2 puffs into the lungs every 4 (four) hours as needed for  wheezing.  3 Inhaler  1  . albuterol (PROVENTIL) (2.5 MG/3ML) 0.083% nebulizer solution Take 3 mLs (2.5 mg total) by nebulization every 6 (six) hours as needed for wheezing.  75 mL  12  . Insulin Glargine (LANTUS SOLOSTAR) 100 UNIT/ML Solostar Pen Inject 21 Units into the skin at bedtime.  10 mL  5    Allergies: Allergies as of 10/06/2013  . (No Known Allergies)   Past Medical History  Diagnosis Date  . Diabetes mellitus 2007    HgA1C (02/20/2010) = 9.2, HgA1C (03/20/2009) = 12.1  . CVA (cerebrovascular accident) 4034     right embolic stroke, no residual deficits  . Diverticulitis   . CVA (cerebral infarction) 7-yrs ago  . Arthritis   . Dysphagia   . CHF (congestive heart failure)   . DIABETES MELLITUS, TYPE II 12/04/2005  . HYPERLIPIDEMIA 12/04/2005  . HYPERTENSION 12/04/2005  . GERD 12/04/2005    . ARTHRITIS, KNEE 03/24/2006  . Lung mass 07/08/2010  . Meningioma 07/21/2010  . Hemangioma of liver 12/02/2010  . Depression 12/02/2010  . Adenocarcinoma of lung      Right upper lobe adenocarcinoma. s/p right lower lobectomy 12/15/10  . Brain cancer     7 cm cerebral right atypical meningoma grade II   Past Surgical History  Procedure Laterality Date  . Abdominal hysterectomy    . Video bronchoscope.  12/29/2007    [place]  . Wide excision of left upper back mass.    . Extracapsular cataract extraction with intraocular      lens implantation.  . Right vats,right thoracotomy,right lower lobectomy with node dissection    . Incision and drainage perirectal abscess N/A 01/19/2013    Procedure: IRRIGATION AND DEBRIDEMENT PERIRECTAL ABSCESS;  Surgeon: Zenovia Jarred, MD;  Location: [facility];  Service: General;  Laterality: N/A;  . Craniotomy N/A 03/02/2013    Procedure: CRANIOTOMY TUMOR EXCISION;  Surgeon: Winfield Cunas, MD;  Location: [facility];  Service: Neurosurgery;  Laterality: N/A;  Bifrontal Craniotomy for tumor   Family History  Problem Relation Age of Onset  . Hyperlipidemia Brother   . Hypertension Brother   . Diabetes Brother    History   Social History  . Marital Status: Widowed    Spouse Name: N/A    Number of Children: N/A  . Years of Education: 10   Occupational History  .  Unemployed   Social History Main Topics  . Smoking status: Former Smoker    Types: Cigarettes    Quit date: 02/17/2000  . Smokeless tobacco: Never Used  . Alcohol Use: No  . Drug Use: No  . Sexual Activity: Not Currently   Other Topics Concern  . Not on file   Social History Narrative   Patient requests to use [employer] for her diabets testing supplies as of 09/05/2009.    Review of Systems: Constitutional: no fevers, +chills Eyes: no vision changes Ears, nose, mouth, throat, and face: no cough Respiratory: no shortness of breath Cardiovascular: no chest  pain Gastrointestinal: +vomiting, no abdominal pain, no constipation, no diarrhea Genitourinary: +dysuria, no hematuria Integument: no rash Hematologic/lymphatic: no bleeding/bruising, no edema Musculoskeletal: no arthralgias, no myalgias Neurological: no paresthesias, +weakness   Physical Exam: Blood pressure 133/95, pulse 80, temperature 98.7 F (37.1 C), temperature source Rectal, resp. rate 20, height 5\' 4"  (1.626 m), weight 170 lb (77.111 kg), SpO2 99.00%. General Apperance: NAD Head: Normocephalic, atraumatic Eyes: PERRL, EOMI, anicteric sclera Ears: Nares normal, septum midline, mucosa  normal Throat: Lips and tongue normal, dry mucous membranes Neck: Supple, trachea midline Back: No tenderness or bony abnormality  Lungs: Clear to auscultation bilaterally. No wheezes, rhonchi or rales. Breathing comfortably Chest Wall: Nontender, no deformity Heart: Regular rate and rhythm, no murmur/rub/gallop Abdomen: Soft, nontender, nondistended, no rebound/guarding Extremities: Normal, atraumatic, warm and well perfused, no edema Pulses: 2+ throughout Skin: No rashes or lesions Neurologic: Alert. Slow to answer some questions. CNII-XII intact. Normal strength and sensation  Lab results: Basic Metabolic Panel:  Recent Labs  10/06/13 0105  NA 136*  K 4.0  CL 99  CO2 23  GLUCOSE 360*  BUN 19  CREATININE 1.20*  CALCIUM 8.9  GAP 14  CBC: No results found for this basename: WBC, NEUTROABS, HGB, HCT, MCV, PLT,  in the last 72 hours  CBG:  Recent Labs  10/06/13 0107  GLUCAP 333*   Urine Drug Screen: Drugs of Abuse     Component Value Date/Time   LABOPIA NONE DETECTED 10/06/2013 0136   LABOPIA PPS 06/27/2012 1645   COCAINSCRNUR NONE DETECTED 10/06/2013 0136   COCAINSCRNUR NEG 06/27/2012 1645   LABBENZ NONE DETECTED 10/06/2013 0136   LABBENZ PPS 06/27/2012 1645   LABBENZ NEG 07/02/2006 2021   AMPHETMU NONE DETECTED 10/06/2013 0136   AMPHETMU NEG 07/02/2006 2021   THCU NONE  DETECTED 10/06/2013 0136   LABBARB NONE DETECTED 10/06/2013 0136   LABBARB NEG 06/27/2012 1645    Urinalysis:  Recent Labs  10/06/13 0136  COLORURINE YELLOW  LABSPEC 1.025  PHURINE 6.5  GLUCOSEU 250*  HGBUR LARGE*  BILIRUBINUR NEGATIVE  KETONESUR NEGATIVE  PROTEINUR 100*  UROBILINOGEN 0.2  NITRITE POSITIVE*  LEUKOCYTESUR MODERATE*   Misc. Labs: Lactic acid 2.49 Fecal occult blood negative  Imaging results:  Ct Head Wo Contrast  10/06/2013   CLINICAL DATA:  Seizure, confusion. Status post meningioma resection.  EXAM: CT HEAD WITHOUT CONTRAST  TECHNIQUE: Contiguous axial images were obtained from the base of the skull through the vertex without intravenous contrast.  COMPARISON:  MRI of the brain September 08, 2013 and CT of the head August 01, 2013  FINDINGS: Status post bifrontal craniotomy for reported resection of meningioma. Right frontal transcortical cystic encephalomalacia with mild ex vacuo dilatation of frontal horn of the right lateral ventricle, unchanged. Stable appearance of the ventricles, no hydrocephalus. Stable appearance the right posterior basal ganglia/internal capsule cystic lacunar infarct. No intraparenchymal hemorrhage, mass effect, midline shift or acute large vascular territory infarct. Next  No abnormal extra-axial fluid collections. Moderate calcific atherosclerosis of the carotid siphons.  No skull fracture. Mild paranasal sinus mucosal thickening without air-fluid levels, frothy secretions in right frontal sinus. Mastoid air cells are well aerated. Mild temporomandibular osteoarthrosis.  IMPRESSION: No acute intracranial process.  Bifrontal craniotomy, with similar underlying right frontal encephalomalacia.  Remote right basal ganglia/internal capsule lacunar infarct.  Mild acute on chronic paranasal sinusitis.   Electronically Signed   By: Elon Alas   On: 10/06/2013 03:41   Dg Chest Port 1 View  10/06/2013   CLINICAL DATA:  Seizure and hematemesis  EXAM:  PORTABLE CHEST - 1 VIEW  COMPARISON:  10/06/2013  FINDINGS: There is mild cardiomegaly which is stable from prior. Stable mild aortic tortuosity. There is chronic right perihilar bandlike opacity which correlates with atelectasis or scar on previous imaging. There are chain sutures also noted in this region. There is no edema, consolidation, effusion, or pneumothorax.  IMPRESSION: 1. No acute findings. 2. Right perihilar scarring in the region of  wedge resection.   Electronically Signed   By: Jorje Guild M.D.   On: 10/06/2013 05:04   Dg Chest Port 1 View  10/06/2013   CLINICAL DATA:  Seizure.  EXAM: PORTABLE CHEST - 1 VIEW  COMPARISON:  04/05/2013.  FINDINGS: The heart is borderline enlarged but stable. The mediastinal and hilar contours are unchanged. There is tortuosity of the thoracic aorta. The lungs are clear except for streaky left basilar atelectasis. No effusion or edema. The bony thorax is intact.  IMPRESSION: Streaky left basilar atelectasis but no infiltrates or effusions.   Electronically Signed   By: Kalman Jewels M.D.   On: 10/06/2013 01:16    Other results: EKG: NSR, LBBB, nonspecific ST changes otherwise unchanged from prior  Assessment & Plan by Problem: Principal Problem:   Seizure Active Problems:   DIABETES MELLITUS, TYPE II   HYPERTENSION   Meningioma   Chronic systolic congestive heart failure   Right upper lobe, Adenocarcinoma of lung   AKI (acute kidney injury)   Iron deficiency anemia, unspecified   Sepsis   UTI (lower urinary tract infection)  Seizure: witnessed seizure by EMS. Per ED, patient was nonverbal - possibly post ictal state. History of seizure 02/25/2013. Underwent resection of 8cm R grade 2 meningioma 03/02/2013. Also history of CVA in 2006. Both CVA and previous brain tumor possible etiologies of seizure. CT head without acute intracranial process. Seizure today may have been 2/2 infection. UDS negative - unlikely drug intoxication/withdrawal. No  hypoglycemia. No anion gap or acidosis - unlikely to be nonketotic hyperglycemia. Spoke to Dr. Leonel Ramsay of Neurology. -Magnesium level, calcium level, TSH -IV keppra 500mg  and increase oral dose to 750mg  BID -EEG -Outpatient follow up with radiation oncology -If patient has recurrent seizure, reconsult neurology  Hemetemesis, melena: on Xarelto for moderate middle lobe and lower lobe PE on CTA chest 04/06/2013. On iron supplementation. Hemoccult negative. History of GERD on Protonix 20mg  daily. Differential includes gastritis, esophagitis, PUD, AVM. BUN wnl. -Awaiting CBC. -Consider GI consult -Xarelto held -continue Protonix -NPO  Dysuria, increased urinary frequency: In the ED, pt was 98.7F, RR 20, HR 74, BP 81/45. She was given 2L NS bolus and her BP increased to 118/76. UA had positive nitrite, moderate leukocytes, many squamous epithelium, many bacteria. She was given 2g ceftriaxone in the ED. -Awaiting CBC -continue ceftriaxone -follow up urine cx  AKI on CKD stage 2: On lasix 20mg  daily and lisinopril 5mg  daily. Possibly prerenal as patient was initially hypotensive and responded to fluid bolus. -Hold lasix and lisinopril -NS@100   CHF: LV EF 51-76%, grade 1 diastolic dysfunction. Does not appear to be in acute exacerbation as she denies SOB and her minimal LE edema is stable. CXR with no effusions. EKG with nonspecific ST changes. -trend troponins -held coreg 25mg  BID -held ASA 81mg  daily -held simvastatin 20mg  daily  HTN: Initially hypotensive but responded to fluid bolus. Now normotensive. -held Coreg, Lasix and lisinopril  DM2: last hgb A1c 03/28/2013 9.7. On Novolog SSI and Lantus 21u QHS -Accuchecks QID, SSI -Recheck hgb A1c  FEN: -NPO -NS@100   DVT ppx: SCDs  Dispo: Disposition is deferred at this time, awaiting improvement of current medical problems. Anticipated discharge in approximately 1 day(s).   The patient does have a current PCP Wilber Oliphant, MD)  and does need an [facility] hospital follow-up appointment after discharge.  The patient does not know have transportation limitations that hinder transportation to clinic appointments.  Signed: Jacques Earthly, MD 10/06/2013, 3:57 AM

## 2013-10-06 NOTE — ED Notes (Signed)
Lab results was given to Dr.McManus.

## 2013-10-06 NOTE — ED Notes (Signed)
Attempted IV x2, IV team paged

## 2013-10-06 NOTE — ED Notes (Signed)
Pt arrives via EMS for seizure, new onset, witnessed by EMS lasted 30 seconds. No meds given. diaphertic upon EMS arrival, no temp, ECK shows L BBB. 20 IV placed in L AC Hypotensive, HR 74

## 2013-10-06 NOTE — Progress Notes (Signed)
PT Cancellation Note  Patient Details Name: [name] MRN: [id number] DOB: 1933-05-24   Cancelled Treatment:    Reason Eval/Treat Not Completed: Patient unavailable.  Pt had just arrived to the unit when PT attempted evaluation. 2 RN's present in room for admission. Will attempt again in PM as schedule allows.    Jolyn Lent 10/06/2013, 12:06 PM  Jolyn Lent, PT, DPT Acute Rehabilitation Services Pager: [phone]

## 2013-10-06 NOTE — ED Provider Notes (Signed)
CSN: [id number]     Arrival date & time 10/06/13  0039 History   First MD Initiated Contact with Patient 10/06/13 0049     Chief Complaint  Patient presents with  . Seizures  . Code Sepsis     (Consider location/radiation/quality/duration/timing/severity/associated sxs/prior Treatment) HPI [name] is a 80 y.o. female who is brought in, by EMS with reported seizure at home. She has been needing more help with her activities of daily living for 2 days. She's never had a seizure before. Today, the seizure apparently lasted for 30 seconds. It is not clear if she is postictal afterwards. The patient is unable to give any history.  Level V caveat- altered mental status    Past Medical History  Diagnosis Date  . Diabetes mellitus 2007    HgA1C (02/20/2010) = 9.2, HgA1C (03/20/2009) = 12.1  . CVA (cerebrovascular accident) 1062     right embolic stroke, no residual deficits  . Diverticulitis   . CVA (cerebral infarction) 7-yrs ago  . Arthritis   . Dysphagia   . CHF (congestive heart failure)   . DIABETES MELLITUS, TYPE II 12/04/2005  . HYPERLIPIDEMIA 12/04/2005  . HYPERTENSION 12/04/2005  . GERD 12/04/2005  . ARTHRITIS, KNEE 03/24/2006  . Lung mass 07/08/2010  . Meningioma 07/21/2010  . Hemangioma of liver 12/02/2010  . Depression 12/02/2010  . Adenocarcinoma of lung      Right upper lobe adenocarcinoma. s/p right lower lobectomy 12/15/10  . Brain cancer     7 cm cerebral right atypical meningoma grade II   Past Surgical History  Procedure Laterality Date  . Abdominal hysterectomy    . Video bronchoscope.  12/29/2007    [place]  . Wide excision of left upper back mass.    . Extracapsular cataract extraction with intraocular      lens implantation.  . Right vats,right thoracotomy,right lower lobectomy with node dissection    . Incision and drainage perirectal abscess N/A 01/19/2013    Procedure: IRRIGATION AND DEBRIDEMENT PERIRECTAL ABSCESS;  Surgeon: Zenovia Jarred,  MD;  Location: [facility];  Service: General;  Laterality: N/A;  . Craniotomy N/A 03/02/2013    Procedure: CRANIOTOMY TUMOR EXCISION;  Surgeon: Winfield Cunas, MD;  Location: [facility];  Service: Neurosurgery;  Laterality: N/A;  Bifrontal Craniotomy for tumor   Family History  Problem Relation Age of Onset  . Hyperlipidemia Brother   . Hypertension Brother   . Diabetes Brother    History  Substance Use Topics  . Smoking status: Former Smoker    Types: Cigarettes    Quit date: 02/17/2000  . Smokeless tobacco: Never Used  . Alcohol Use: No   OB History   Grav Para Term Preterm Abortions TAB SAB Ect Mult Living                 Review of Systems  Unable to perform ROS     Allergies  Review of patient's allergies indicates no known allergies.  Home Medications   Prior to Admission medications   Medication Sig Start Date End Date Taking? Authorizing Provider  aspirin 81 MG tablet Take 81 mg by mouth daily.    Yes Historical Provider, MD  carvedilol (COREG) 25 MG tablet Take 1 tablet (25 mg total) by mouth 2 (two) times daily with a meal. 01/03/13  Yes Wilber Oliphant, MD  furosemide (LASIX) 20 MG tablet Take 1 tablet (20 mg total) by mouth daily. 05/11/13  Yes Karren Cobble, MD  insulin aspart (NOVOLOG FLEXPEN) 100 UNIT/ML FlexPen Take three times with meals  <70    2 units if going to eat- skip if going to skip meal   71-140    4 units Novolog   141-210  6 units Novolog   211-280  8 units 03/07/13  Yes Marjan Rabbani, MD  iron polysaccharides (NIFEREX) 150 MG capsule Take 1 capsule (150 mg total) by mouth daily. 04/05/13  Yes Jones Bales, MD  levETIRAcetam (KEPPRA) 500 MG tablet Take 500 mg by mouth 2 (two) times daily.   Yes Historical Provider, MD  lisinopril (PRINIVIL,ZESTRIL) 5 MG tablet Take 1 tablet (5 mg total) by mouth daily. 06/26/13  Yes Vijaya Mercer Pod, MD  pantoprazole (PROTONIX) 20 MG tablet Take 20 mg by mouth daily.   Yes Historical Provider, MD   rivaroxaban (XARELTO) 20 MG TABS tablet Take 1 tablet (20 mg total) by mouth daily with supper. 06/23/13  Yes Wilber Oliphant, MD  simvastatin (ZOCOR) 20 MG tablet Take 20 mg by mouth daily.   Yes Historical Provider, MD  albuterol (PROVENTIL HFA;VENTOLIN HFA) 108 (90 BASE) MCG/ACT inhaler Inhale 2 puffs into the lungs every 4 (four) hours as needed for wheezing. 01/03/13   Wilber Oliphant, MD  albuterol (PROVENTIL) (2.5 MG/3ML) 0.083% nebulizer solution Take 3 mLs (2.5 mg total) by nebulization every 6 (six) hours as needed for wheezing. 11/03/11   Ansel Bong, MD  Insulin Glargine (LANTUS SOLOSTAR) 100 UNIT/ML Solostar Pen Inject 21 Units into the skin at bedtime. 05/02/13   Wilber Oliphant, MD   BP 133/95  Pulse 80  Temp(Src) 98.7 F (37.1 C) (Rectal)  Resp 20  Ht 5\' 4"  (1.626 m)  Wt 170 lb (77.111 kg)  BMI 29.17 kg/m2  SpO2 99% Physical Exam  Nursing note and vitals reviewed. Constitutional: She appears well-developed.  Elderly, frail  HENT:  Head: Normocephalic and atraumatic.  She is holding saliva in her mouth, not swallowing it. She refuses to open her mouth for exam.  Eyes: Conjunctivae and EOM are normal. Pupils are equal, round, and reactive to light. Right eye exhibits no discharge. Left eye exhibits no discharge. No scleral icterus.  Mucous membranes are pale  Neck: Normal range of motion and phonation normal. Neck supple.  Cardiovascular: Normal rate, regular rhythm and intact distal pulses.   Pulmonary/Chest: Effort normal and breath sounds normal. No respiratory distress. She has no wheezes. She exhibits no tenderness.  Abdominal: Soft. She exhibits no distension. There is no tenderness. There is no guarding.  Musculoskeletal: Normal range of motion.  Neurological: She is alert. She exhibits normal muscle tone.  Lethargic, non-verbal.  Skin: Skin is warm and dry.  Skin cool to touch  Psychiatric:  She is obtunded and    ED Course  Procedures (including critical  care time)  Medications  sodium chloride 0.9 % bolus 2,313 mL (2,313 mLs Intravenous New Bag/Given 10/06/13 0058)    Followed by  0.9 %  sodium chloride infusion (not administered)  cefTRIAXone (ROCEPHIN) 2 g in dextrose 5 % 50 mL IVPB (0 g Intravenous Stopped 10/06/13 0143)  ondansetron (ZOFRAN) injection 4 mg (4 mg Intravenous Given 10/06/13 0204)    Patient Vitals for the past 24 hrs:  BP Temp Temp src Pulse Resp SpO2 Height Weight  10/06/13 0323 133/95 mmHg - - 80 20 99 % - -  10/06/13 0130 - 98.7 F (37.1 C) Rectal - - - - -  10/06/13 0115 96/55 mmHg - -  76 - 98 % - -  10/06/13 0045 81/45 mmHg 98.2 F (36.8 C) Oral 74 20 97 % - -  10/06/13 0042 - - - - - - 5\' 4"  (1.626 m) 170 lb (77.111 kg)  10/06/13 0039 - - - - - 98 % - -    3:47 AM Reevaluation with update and discussion. After initial assessment and treatment, an updated evaluation reveals patient is now alert, and responsive to family members who are with her. The daughter reports that tonight the patient was weak and had trouble walking than spontaneously voided on herself, twice. After that, she turned her head to the left, and had a staring spell, while sitting. This was typical of her prior seizures. She has otherwise been well. Findings discussed with family members, all questions asked. Analya Louissaint L   3:50 AM-Consult complete with [facility] resident. Patient case explained and discussed. She grees to admit patient for further evaluation and treatment. Call ended at [facility] - Abnormal; Notable for the following:    Sodium 136 (*)    Glucose, Bld 360 (*)    Creatinine, Ser 1.20 (*)    GFR calc non Af Amer 42 (*)    GFR calc Af Amer 49 (*)    All other components within normal limits  URINALYSIS, ROUTINE W REFLEX MICROSCOPIC - Abnormal; Notable for the following:    APPearance TURBID (*)    Glucose, UA 250 (*)    Hgb urine dipstick LARGE (*)    Protein, ur 100 (*)     Nitrite POSITIVE (*)    Leukocytes, UA MODERATE (*)    All other components within normal limits  URINE MICROSCOPIC-ADD ON - Abnormal; Notable for the following:    Squamous Epithelial / LPF MANY (*)    Bacteria, UA MANY (*)    All other components within normal limits  CBG MONITORING, ED - Abnormal; Notable for the following:    Glucose-Capillary 333 (*)    All other components within normal limits  I-STAT CG4 LACTIC ACID, ED - Abnormal; Notable for the following:    Lactic Acid, Venous 2.49 (*)    All other components within normal limits  CULTURE, BLOOD (ROUTINE X 2)  CULTURE, BLOOD (ROUTINE X 2)  URINE CULTURE  I-STAT CG4 LACTIC ACID, ED  POC OCCULT BLOOD, ED   Component     Latest Ref Rng 06/09/2013 09/08/2013 10/06/2013            Sodium     137 - 147 mEq/L   136 (L)  Potassium     3.7 - 5.3 mEq/L   4.0  Chloride     96 - 112 mEq/L   99  CO2     19 - 32 mEq/L   23  Glucose     70 - 99 mg/dL   360 (H)  BUN     6 - 23 mg/dL  17.3 19  Creatinine     0.50 - 1.10 mg/dL 0.84 1.0 1.20 (H)  Calcium     8.4 - 10.5 mg/dL   8.9  GFR calc non Af Amer     >90 mL/min 65 (L)  42 (L)  GFR calc Af Amer     >90 mL/min 76 (L)  49 (L)  Anion gap     5 - 15   14   CRITICAL CARE Performed by: Daleen Bo L Total critical care time: 50 minutes Critical care  time was exclusive of separately billable procedures and treating other patients. Critical care was necessary to treat or prevent imminent or life-threatening deterioration. Critical care was time spent personally by me on the following activities: development of treatment plan with patient and/or surrogate as well as nursing, discussions with consultants, evaluation of patient's response to treatment, examination of patient, obtaining history from patient or surrogate, ordering and performing treatments and interventions, ordering and review of laboratory studies, ordering and review of radiographic studies, pulse oximetry and  re-evaluation of patient's condition.  Imaging Review Ct Head Wo Contrast  10/06/2013   CLINICAL DATA:  Seizure, confusion. Status post meningioma resection.  EXAM: CT HEAD WITHOUT CONTRAST  TECHNIQUE: Contiguous axial images were obtained from the base of the skull through the vertex without intravenous contrast.  COMPARISON:  MRI of the brain September 08, 2013 and CT of the head August 01, 2013  FINDINGS: Status post bifrontal craniotomy for reported resection of meningioma. Right frontal transcortical cystic encephalomalacia with mild ex vacuo dilatation of frontal horn of the right lateral ventricle, unchanged. Stable appearance of the ventricles, no hydrocephalus. Stable appearance the right posterior basal ganglia/internal capsule cystic lacunar infarct. No intraparenchymal hemorrhage, mass effect, midline shift or acute large vascular territory infarct. Next  No abnormal extra-axial fluid collections. Moderate calcific atherosclerosis of the carotid siphons.  No skull fracture. Mild paranasal sinus mucosal thickening without air-fluid levels, frothy secretions in right frontal sinus. Mastoid air cells are well aerated. Mild temporomandibular osteoarthrosis.  IMPRESSION: No acute intracranial process.  Bifrontal craniotomy, with similar underlying right frontal encephalomalacia.  Remote right basal ganglia/internal capsule lacunar infarct.  Mild acute on chronic paranasal sinusitis.   Electronically Signed   By: Elon Alas   On: 10/06/2013 03:41   Dg Chest Port 1 View  10/06/2013   CLINICAL DATA:  Seizure.  EXAM: PORTABLE CHEST - 1 VIEW  COMPARISON:  04/05/2013.  FINDINGS: The heart is borderline enlarged but stable. The mediastinal and hilar contours are unchanged. There is tortuosity of the thoracic aorta. The lungs are clear except for streaky left basilar atelectasis. No effusion or edema. The bony thorax is intact.  IMPRESSION: Streaky left basilar atelectasis but no infiltrates or effusions.    Electronically Signed   By: Kalman Jewels M.D.   On: 10/06/2013 01:16     Date: 10/06/13- Muse hyperlink inactive  Rate: 79  Rhythm: normal sinus rhythm  QRS Axis: left  PR and QT Intervals: normal  ST/T Wave abnormalities: nonspecific ST changes  PR and QRS Conduction Disutrbances: LBBB  Narrative InterpretaPlan: Admittion:   Old EKG Reviewed: unchanged- 04/06/13    EKG Interpretation None      MDM   Final diagnoses:  Sepsis, due to unspecified organism  Urinary tract infection with hematuria, site unspecified  Acute kidney injury  Hyperglycemia  Seizure   Sirs with sepsis, slowed response of hypotension, and mildly elevated lactate. Source of infection appears to be urinary tract. Patient has had vomiting that is coffee ground in color, and dark stool, which is Hemoccult negative. Metabolic assessment reveals mild renal insufficiency, and elevated glucose, a relative to baseline. Patient will need to be admitted for further treatment and management. Appropriate, bed placement appears to require stepdown ICU level.  Nursing Notes Reviewed/ Care Coordinated, and agree without changes. Applicable Imaging Reviewed.  Interpretation of Laboratory Data incorporated into ED treatment  Plan : Admit    Richarda Blade, MD 10/06/13 [id number]

## 2013-10-06 NOTE — ED Notes (Signed)
IV team unsuccessful for second IV

## 2013-10-06 NOTE — H&P (Signed)
  Date: 10/06/2013  Patient name: [name]  Medical record number: [id number]  Date of birth: 1933-05-02   I have seen and evaluated [name] and discussed their care with the Residency Team.   Assessment and Plan: I have seen and evaluated the patient as outlined above. I agree with the formulated Assessment and Plan as detailed in the residents' admission note, with the following changes:   1. Seizure - pt has known sz d/o and is on Keppra. There is no documented h/o non compliance. Likely triggers are UTI and prior structural damage from CVA and meningioma resection. Pt has had EEG which has not been read. She was loaded with Keppra and her daily dose was increased.   2. UTI with hypotension and ARF - pt does not meet SIRS criteria but has a documented source of infxn and was hypotensive on admit with an increased Cr. She also appeared to be hemoconcentrated. She has had minimal urine outpt. Will challenge with IVF (her last EF Feb 2015 was nl and there was no definite diastolic dysfxn) to ensure perfusion of kidneys and other organs. Treat with Rocephin and follow Cx.   3. H/O PE 03/2013 - she was tx with Jennye Moccasin. It was felt to be provoked and initially planned to tx for 3 months. She has now completed 6 months. Xarelto currently being held 2/2 ? GI bleed and team will need to check with PCP to see if agrees with D/C of med now that 6 months has been completed.   Bartholomew Crews, MD 8/21/20154:06 PM

## 2013-10-06 NOTE — Progress Notes (Signed)
OT Cancellation Note  Patient Details Name: RANATA LAUGHERY MRN: [id number] DOB: 1933-07-14   Cancelled Treatment:    Reason Eval/Treat Not Completed: Patient at procedure or test/ unavailable - pt with another provider.  Will reattempt.  Darlina Rumpf Killian, OTR/L [phone]  10/06/2013, 4:52 PM

## 2013-10-06 NOTE — Progress Notes (Signed)
EEG Completed; Results Pending  

## 2013-10-06 NOTE — ED Notes (Signed)
Spoke with 2H charge nurse, she will have primary nurse call for report.

## 2013-10-06 NOTE — ED Notes (Signed)
Admitting team at bedside.

## 2013-10-06 NOTE — Telephone Encounter (Signed)
Patient currently admitted to the hospital.  Thank you

## 2013-10-06 NOTE — ED Notes (Signed)
Attempted phlebotomy stick, unable to get blood

## 2013-10-07 ENCOUNTER — Inpatient Hospital Stay (HOSPITAL_COMMUNITY): Payer: Medicare Other

## 2013-10-07 LAB — GLUCOSE, CAPILLARY
Glucose-Capillary: 213 mg/dL — ABNORMAL HIGH (ref 70–99)
Glucose-Capillary: 262 mg/dL — ABNORMAL HIGH (ref 70–99)
Glucose-Capillary: 220 mg/dL — ABNORMAL HIGH (ref 70–99)
Glucose-Capillary: 174 mg/dL — ABNORMAL HIGH (ref 70–99)

## 2013-10-07 LAB — HEMOGLOBIN A1C
Hgb A1c MFr Bld: 11 % — ABNORMAL HIGH (ref <5.7)
Mean Plasma Glucose: 269 mg/dL — ABNORMAL HIGH (ref <117)

## 2013-10-07 LAB — FOLATE RBC: RBC Folate: 992 ng/mL — ABNORMAL HIGH (ref >280)

## 2013-10-07 LAB — TROPONIN I: Troponin I: <0.3 ng/mL (ref <0.30)

## 2013-10-07 MED ORDER — FLUOXETINE HCL 10 MG PO CAPS
10.0000 mg | ORAL_CAPSULE | Freq: Every day | ORAL | Status: DC
  Administered 2013-10-07 – 2013-10-08 (×2): 10 mg via ORAL
  Filled 2013-10-07 (×2): qty 1

## 2013-10-07 MED ORDER — IOHEXOL 300 MG/ML  SOLN
25.0000 mL | INTRAMUSCULAR | Status: AC
  Administered 2013-10-07 (×2): 25 mL via ORAL

## 2013-10-07 MED ORDER — LISINOPRIL 5 MG PO TABS
5.0000 mg | ORAL_TABLET | Freq: Every day | ORAL | Status: DC
  Administered 2013-10-07 – 2013-10-08 (×2): 5 mg via ORAL
  Filled 2013-10-07 (×2): qty 1

## 2013-10-07 MED ORDER — CARVEDILOL 25 MG PO TABS
25.0000 mg | ORAL_TABLET | Freq: Two times a day (BID) | ORAL | Status: DC
  Administered 2013-10-07 – 2013-10-08 (×3): 25 mg via ORAL
  Filled 2013-10-07 (×5): qty 1

## 2013-10-07 MED ORDER — SENNOSIDES-DOCUSATE SODIUM 8.6-50 MG PO TABS
1.0000 | ORAL_TABLET | Freq: Two times a day (BID) | ORAL | Status: DC
  Administered 2013-10-07 (×2): 1 via ORAL
  Filled 2013-10-07 (×2): qty 1

## 2013-10-07 MED ORDER — RIVAROXABAN 20 MG PO TABS
20.0000 mg | ORAL_TABLET | Freq: Every day | ORAL | Status: DC
  Administered 2013-10-07: 20 mg via ORAL
  Filled 2013-10-07 (×2): qty 1

## 2013-10-07 MED ORDER — LEVETIRACETAM 750 MG PO TABS
750.0000 mg | ORAL_TABLET | Freq: Two times a day (BID) | ORAL | Status: DC
  Administered 2013-10-07 – 2013-10-08 (×3): 750 mg via ORAL
  Filled 2013-10-07 (×4): qty 1

## 2013-10-07 MED ORDER — IOHEXOL 300 MG/ML  SOLN
100.0000 mL | Freq: Once | INTRAMUSCULAR | Status: AC | PRN
  Administered 2013-10-07: 100 mL via INTRAVENOUS

## 2013-10-07 NOTE — Progress Notes (Addendum)
Subjective:  Last BM 5 days ago was black. No bm since then. Denies hemopytisis. Denies any cp/sob/n/v/fever/chills. Has chronic back and knee pain. Feels fine.   States that she lives with daughter who takes care of her meds and also foods. She uses walker to move around at home. Denies any falls recently. Wants to go home.     Objective: Vital signs in last 24 hours: Filed Vitals:   10/07/13 0400 10/07/13 0500 10/07/13 0550 10/07/13 0600  BP: 151/64 131/70  161/76  Pulse: 82 78  77  Temp: 98.2 F (36.8 C)     TempSrc: Oral     Resp: 15 18  14   Height:      Weight:   94.5 kg (208 lb 5.4 oz)   SpO2: 100% 99%  100%   Weight change: 17.389 kg (38 lb 5.4 oz)  Intake/Output Summary (Last 24 hours) at 10/07/13 0727 Last data filed at 10/07/13 0700  Gross per 24 hour  Intake 3254.17 ml  Output    757 ml  Net 2497.17 ml   Vitals reviewed. General: resting in bed, NAD HEENT: PERRL, EOMI, no scleral icterus Cardiac: RRR, no rubs, murmurs or gallops Pulm: clear to auscultation bilaterally, no wheezes, rales, or rhonchi Abd: soft, nontender, nondistended, BS present Ext: warm and well perfused, no pedal edema Neuro: alert and oriented X3, cranial nerves II-XII grossly intact, strength and sensation to light touch equal in bilateral upper and lower extremities. Cannot do 3 word recall, serial of 7's, or spell "world"  Backward. Repetition is slow.  Lab Results: Basic Metabolic Panel:  Recent Labs Lab 10/06/13 0105 10/06/13 1455  NA 136* 144  K 4.0 4.0  CL 99 108  CO2 23 21  GLUCOSE 360* 142*  BUN 19 15  CREATININE 1.20* 0.82  CALCIUM 8.9 8.3*  MG  --  1.7  PHOS  --  3.2   Liver Function Tests:  Recent Labs Lab 10/06/13 1455  AST 13  ALT 7  ALKPHOS 62  BILITOT 0.2*  PROT 6.2  ALBUMIN 3.0*   No results found for this basename: LIPASE, AMYLASE,  in the last 168 hours No results found for this basename: AMMONIA,  in the last 168 hours CBC:  Recent  Labs Lab 10/06/13 0530 10/06/13 1455  WBC 7.9 6.2  NEUTROABS 5.6  --   HGB 11.8* 10.8*  HCT 37.9 35.7*  MCV 78.1 77.4*  PLT 194 215   Cardiac Enzymes:  Recent Labs Lab 10/06/13 1455 10/06/13 2222 10/07/13 0235  TROPONINI <0.30 <0.30 <0.30   BNP: No results found for this basename: PROBNP,  in the last 168 hours D-Dimer: No results found for this basename: DDIMER,  in the last 168 hours CBG:  Recent Labs Lab 10/06/13 0107 10/06/13 1711 10/06/13 2141  GLUCAP 333* 167* 263*   Hemoglobin A1C:  Recent Labs Lab 10/06/13 1455  HGBA1C 11.0*   Fasting Lipid Panel: No results found for this basename: CHOL, HDL, LDLCALC, TRIG, CHOLHDL, LDLDIRECT,  in the last 168 hours Thyroid Function Tests:  Recent Labs Lab 10/06/13 0740  TSH 1.250   Coagulation: No results found for this basename: LABPROT, INR,  in the last 168 hours Anemia Panel:  Recent Labs Lab 10/06/13 1455  VITAMINB12 469   Urine Drug Screen: Drugs of Abuse     Component Value Date/Time   LABOPIA NONE DETECTED 10/06/2013 0136   LABOPIA PPS 06/27/2012 [place] DETECTED 10/06/2013 [place]  NEG 06/27/2012 1645   LABBENZ NONE DETECTED 10/06/2013 0136   LABBENZ PPS 06/27/2012 1645   LABBENZ NEG 07/02/2006 2021   AMPHETMU NONE DETECTED 10/06/2013 0136   AMPHETMU NEG 07/02/2006 2021   THCU NONE DETECTED 10/06/2013 0136   LABBARB NONE DETECTED 10/06/2013 0136   LABBARB NEG 06/27/2012 1645    Alcohol Level: No results found for this basename: ETH,  in the last 168 hours Urinalysis:  Recent Labs Lab 10/06/13 0136  COLORURINE YELLOW  LABSPEC 1.025  PHURINE 6.5  GLUCOSEU 250*  HGBUR LARGE*  BILIRUBINUR NEGATIVE  KETONESUR NEGATIVE  PROTEINUR 100*  UROBILINOGEN 0.2  NITRITE POSITIVE*  LEUKOCYTESUR MODERATE*   Misc. Labs:  Micro Results: Recent Results (from the past 240 hour(s))  CULTURE, BLOOD (ROUTINE X 2)     Status: None   Collection Time    10/06/13  1:05 AM       Result Value Ref Range Status   Specimen Description BLOOD LEFT HAND   Final   Special Requests BOTTLES DRAWN AEROBIC ONLY 4CC   Final   Culture  Setup Time     Final   Value: 10/06/2013 08:29     Performed at [facility]   Culture     Final   Value: GRAM POSITIVE COCCI IN CHAINS     Note: Gram Stain Report Called to,Read Back By and Verified With: Stephanie Acre 10/07/13 0657A [place]     Performed at [facility]   Report Status PENDING   Incomplete  MRSA PCR SCREENING     Status: Abnormal   Collection Time    10/06/13  3:26 PM      Result Value Ref Range Status   MRSA by PCR POSITIVE (*) NEGATIVE Final   Comment:            The GeneXpert MRSA Assay (FDA     approved for NASAL specimens     only), is one component of a     comprehensive MRSA colonization     surveillance program. It is not     intended to diagnose MRSA     infection nor to guide or     monitor treatment for     MRSA infections.     RESULT CALLED TO, READ BACK BY AND VERIFIED WITH:     S. WATERS RN 17:20 10/06/13 ([email or web address])   Studies/Results: Ct Head Wo Contrast  10/06/2013   CLINICAL DATA:  Seizure, confusion. Status post meningioma resection.  EXAM: CT HEAD WITHOUT CONTRAST  TECHNIQUE: Contiguous axial images were obtained from the base of the skull through the vertex without intravenous contrast.  COMPARISON:  MRI of the brain September 08, 2013 and CT of the head August 01, 2013  FINDINGS: Status post bifrontal craniotomy for reported resection of meningioma. Right frontal transcortical cystic encephalomalacia with mild ex vacuo dilatation of frontal horn of the right lateral ventricle, unchanged. Stable appearance of the ventricles, no hydrocephalus. Stable appearance the right posterior basal ganglia/internal capsule cystic lacunar infarct. No intraparenchymal hemorrhage, mass effect, midline shift or acute large vascular territory infarct. Next  No abnormal extra-axial fluid collections. Moderate  calcific atherosclerosis of the carotid siphons.  No skull fracture. Mild paranasal sinus mucosal thickening without air-fluid levels, frothy secretions in right frontal sinus. Mastoid air cells are well aerated. Mild temporomandibular osteoarthrosis.  IMPRESSION: No acute intracranial process.  Bifrontal craniotomy, with similar underlying right frontal encephalomalacia.  Remote right basal ganglia/internal capsule lacunar infarct.  Mild acute on  chronic paranasal sinusitis.   Electronically Signed   By: Elon Alas   On: 10/06/2013 03:41   Dg Chest Port 1 View  10/06/2013   CLINICAL DATA:  Seizure and hematemesis  EXAM: PORTABLE CHEST - 1 VIEW  COMPARISON:  10/06/2013  FINDINGS: There is mild cardiomegaly which is stable from prior. Stable mild aortic tortuosity. There is chronic right perihilar bandlike opacity which correlates with atelectasis or scar on previous imaging. There are chain sutures also noted in this region. There is no edema, consolidation, effusion, or pneumothorax.  IMPRESSION: 1. No acute findings. 2. Right perihilar scarring in the region of wedge resection.   Electronically Signed   By: Jorje Guild M.D.   On: 10/06/2013 05:04   Dg Chest Port 1 View  10/06/2013   CLINICAL DATA:  Seizure.  EXAM: PORTABLE CHEST - 1 VIEW  COMPARISON:  04/05/2013.  FINDINGS: The heart is borderline enlarged but stable. The mediastinal and hilar contours are unchanged. There is tortuosity of the thoracic aorta. The lungs are clear except for streaky left basilar atelectasis. No effusion or edema. The bony thorax is intact.  IMPRESSION: Streaky left basilar atelectasis but no infiltrates or effusions.   Electronically Signed   By: Kalman Jewels M.D.   On: 10/06/2013 01:16   Medications: I have reviewed the patient's current medications. Scheduled Meds: . cefTRIAXone (ROCEPHIN)  IV  1 g Intravenous Q24H  . Chlorhexidine Gluconate Cloth  6 each Topical Q0600  . insulin aspart  0-15 Units  Subcutaneous TID WC  . levETIRAcetam  750 mg Intravenous Q12H  . mupirocin ointment  1 application Nasal BID  . pantoprazole (PROTONIX) IV  40 mg Intravenous Q24H  . sodium chloride  3 mL Intravenous Q12H   Continuous Infusions:   PRN Meds:.ondansetron (ZOFRAN) IV, ondansetron  80 yo female with hx of seizures, CHF, HTN, DM2 here after witnessed seizure by EMS, found to have UTI and AKI.   Assessment/Plan:  Seizure and confusion- witnessed seizure by EMS. Per ED, patient was nonverbal - possibly post ictal state. History of seizure 02/25/2013. Underwent resection of 8cm R grade 2 meningioma 03/02/2013. Also history of CVA in 2006. CT head without acute intracranial process. Seizure today may have been 2/2 infection but also has risk from previous brain tumor possible. UDS negative. Spoke to Dr. Nicole Kindred and Dr. Leonel Ramsay neuro. Was keppra 500mg  bid at home.  -loaded with IV keppra 1g then 750mg  BID IV. SLP done, rec regular, thin liquid. Will switch to keppra 750mg  BID PO. -EEG negative for seizure but shows severe slowing indicating encephalopathy. Likely dementia since this is chronic for her based on discuss with the physicians who took care of her in the past at clinic.  -Outpatient follow up with radiation oncology -If patient has recurrent seizure, reconsult neurology    UTI - In the ED, pt was 98.10F, RR 20, HR 74, BP 81/45. She was given 2L NS bolus and her BP was in 110's, dropped down to 110's now. positive nitrite, moderate leukocytes, many squamous epithelium, many bacteria. She was given 2g ceftriaxone in the ED.  UOP remains low despite fluid resuscitation. - last 12 hr 400cc UOP total with IVF. Bladder scan shows ~24 cc. Renal u/s shows no hydro, kidneys are normal. Shows liver mass and pelvic mass.   -continue ceftriaxone per pharm for UTI. Ucx positive for GNRs - BCX 1/2 positive for GPC in chains (could be strep or enterococcus?). Patient remails afebrile. If strep, would  cover with ceftriaxone. Will wait full speciation unless patient worsens. If patient worsens, will add Vanc.  - repeat bcx today.   HTN: Initially hypotensive SBP 80's but responded to fluid bolus. -initially held home Coreg 25 mg BID, lasix 20mg  qdaily, and lisinopril 5mg  daily.  Now hypertensive 170's.   - d/ced IVF since BP now high . Received hydralazine 5mg  IV and Coreg 12mg  once for high BP 170's.  - Will add back her home antihypertensives slowly as we don't want her to bottom out again without the fluid   (restart coreg and lisinopril today). Will start lasix later.  CHF: LV EF 35-70%, grade 1 diastolic dysfunction. Does not appear to be in acute exacerbation as she denies SOB and her minimal LE edema is stable. CXR with no effusions. EKG with nonspecific ST changes.  -trops neg - cont coreg 25mg  BID home dose.  - held ASA 81mg  daily for GI bleed - will restart if no longer bleeding - held simvastatin 20mg  daily   AKI on CKD stage 2: now resolved. On lasix 20mg  daily and lisinopril 5mg  daily. Possibly prerenal as patient was initially hypotensive and responded to fluid bolus.  - Hold lasix and lisinopril for now. - repeat BMP/CBC tomorrow.  DM2 poorly controlled last hgb A1c 03/28/2013 9.7. 10/06/13 is 11.  On Novolog SSI and Lantus 21u QHS at home -Accuchecks QID, SSI. Will add back lantus if BS remains high.    FEN:  - carb mod.  - sennokot for constipation.  DVT ppx: SCDs.   Dispo: Disposition is deferred at this time, awaiting improvement of current medical problems.  Anticipated discharge in approximately 2-3 day(s).   The patient does have a current PCP Wilber Oliphant, MD) and does need an [facility] hospital follow-up appointment after discharge.  The patient does not know have transportation limitations that hinder transportation to clinic appointments.  .Services Needed at time of discharge: Y = Yes, Blank = No PT:   OT:   RN:   Equipment:   Other:     LOS:  1 day   Dellia Nims, MD 10/07/2013, 7:27 AM

## 2013-10-08 LAB — URINE CULTURE

## 2013-10-08 LAB — LEVETIRACETAM LEVEL: Levetiracetam Lvl: 22.8 ug/mL

## 2013-10-08 LAB — GLUCOSE, CAPILLARY
GLUCOSE-CAPILLARY: 160 mg/dL — AB (ref 70–99)
GLUCOSE-CAPILLARY: 217 mg/dL — AB (ref 70–99)

## 2013-10-08 MED ORDER — SENNOSIDES-DOCUSATE SODIUM 8.6-50 MG PO TABS: 1.0000 | ORAL_TABLET | Freq: Two times a day (BID) | ORAL | Status: DC

## 2013-10-08 MED ORDER — AMOXICILLIN 500 MG PO CAPS: 500.0000 mg | ORAL_CAPSULE | Freq: Three times a day (TID) | ORAL | Status: DC

## 2013-10-08 MED ORDER — FUROSEMIDE 20 MG PO TABS
20.0000 mg | ORAL_TABLET | Freq: Every day | ORAL | Status: DC
  Administered 2013-10-08: 20 mg via ORAL
  Filled 2013-10-08: qty 1

## 2013-10-08 MED ORDER — PANTOPRAZOLE SODIUM 40 MG PO TBEC
40.0000 mg | DELAYED_RELEASE_TABLET | Freq: Every day | ORAL | Status: DC
  Administered 2013-10-08: 40 mg via ORAL
  Filled 2013-10-08: qty 1

## 2013-10-08 MED ORDER — FLUOXETINE HCL 10 MG PO CAPS: 10.0000 mg | ORAL_CAPSULE | Freq: Every day | ORAL | Status: DC

## 2013-10-08 MED ORDER — LEVETIRACETAM 750 MG PO TABS: 750.0000 mg | ORAL_TABLET | Freq: Two times a day (BID) | ORAL | Status: DC

## 2013-10-08 NOTE — Progress Notes (Signed)
Subjective:  Doing well. Ate well. No chest pain. Denies SOB, diarrhea, dysuria, having good UOP today. Wants to go home. Daughter feels safe to take her home with her supervision. They also have a home nurse who helps out part time. Wants to talk to SW for more help if possible.  Objective: Vital signs in last 24 hours: Filed Vitals:   10/08/13 0000 10/08/13 0400 10/08/13 0749 10/08/13 0800  BP: 162/63 163/54 203/58   Pulse: 70 70 73 72  Temp:   98.2 F (36.8 C)   TempSrc:   Oral   Resp: 21 18    Height:      Weight:      SpO2: 100% 100% 100% 100%   Weight change:   Intake/Output Summary (Last 24 hours) at 10/08/13 1058 Last data filed at 10/08/13 0900  Gross per 24 hour  Intake    680 ml  Output   1400 ml  Net   -720 ml   Vitals reviewed. General: resting in bed, NAD HEENT: PERRL, EOMI, no scleral icterus Cardiac: RRR, no rubs, murmurs or gallops Pulm: clear to auscultation bilaterally, no wheezes, rales, or rhonchi Abd: soft, nontender, nondistended, BS present Ext: warm and well perfused, no pedal edema Neuro: alert and oriented X3, cranial nerves II-XII grossly intact, strength and sensation to light touch equal in bilateral upper and lower extremities. Cannot do 3 word recall, serial of 7's, or spell "world"  Backward. Repetition is slow.  Lab Results: Basic Metabolic Panel:  Recent Labs Lab 10/06/13 0105 10/06/13 1455  NA 136* 144  K 4.0 4.0  CL 99 108  CO2 23 21  GLUCOSE 360* 142*  BUN 19 15  CREATININE 1.20* 0.82  CALCIUM 8.9 8.3*  MG  --  1.7  PHOS  --  3.2   Liver Function Tests:  Recent Labs Lab 10/06/13 1455  AST 13  ALT 7  ALKPHOS 62  BILITOT 0.2*  PROT 6.2  ALBUMIN 3.0*   No results found for this basename: LIPASE, AMYLASE,  in the last 168 hours No results found for this basename: AMMONIA,  in the last 168 hours CBC:  Recent Labs Lab 10/06/13 0530 10/06/13 1455  WBC 7.9 6.2  NEUTROABS 5.6  --   HGB 11.8* 10.8*  HCT 37.9  35.7*  MCV 78.1 77.4*  PLT 194 215   Cardiac Enzymes:  Recent Labs Lab 10/06/13 1455 10/06/13 2222 10/07/13 0235  TROPONINI <0.30 <0.30 <0.30   BNP: No results found for this basename: PROBNP,  in the last 168 hours D-Dimer: No results found for this basename: DDIMER,  in the last 168 hours CBG:  Recent Labs Lab 10/06/13 2141 10/07/13 0756 10/07/13 1134 10/07/13 1632 10/07/13 2156 10/08/13 0749  GLUCAP 263* 213* 262* 220* 174* 160*   Hemoglobin A1C:  Recent Labs Lab 10/06/13 1455  HGBA1C 11.0*   Fasting Lipid Panel: No results found for this basename: CHOL, HDL, LDLCALC, TRIG, CHOLHDL, LDLDIRECT,  in the last 168 hours Thyroid Function Tests:  Recent Labs Lab 10/06/13 0740  TSH 1.250   Coagulation: No results found for this basename: LABPROT, INR,  in the last 168 hours Anemia Panel:  Recent Labs Lab 10/06/13 1455  VITAMINB12 469   Urine Drug Screen: Drugs of Abuse     Component Value Date/Time   LABOPIA NONE DETECTED 10/06/2013 0136   LABOPIA PPS 06/27/2012 1645   COCAINSCRNUR NONE DETECTED 10/06/2013 0136   COCAINSCRNUR NEG 06/27/2012 1645   LABBENZ NONE  DETECTED 10/06/2013 0136   LABBENZ PPS 06/27/2012 1645   LABBENZ NEG 07/02/2006 2021   AMPHETMU NONE DETECTED 10/06/2013 0136   AMPHETMU NEG 07/02/2006 2021   THCU NONE DETECTED 10/06/2013 0136   LABBARB NONE DETECTED 10/06/2013 0136   LABBARB NEG 06/27/2012 1645    Alcohol Level: No results found for this basename: ETH,  in the last 168 hours Urinalysis:  Recent Labs Lab 10/06/13 0136  COLORURINE YELLOW  LABSPEC 1.025  PHURINE 6.5  GLUCOSEU 250*  HGBUR LARGE*  BILIRUBINUR NEGATIVE  KETONESUR NEGATIVE  PROTEINUR 100*  UROBILINOGEN 0.2  NITRITE POSITIVE*  LEUKOCYTESUR MODERATE*   Misc. Labs:  Micro Results: Recent Results (from the past 240 hour(s))  CULTURE, BLOOD (ROUTINE X 2)     Status: None   Collection Time    10/06/13  1:05 AM      Result Value Ref Range Status   Specimen  Description BLOOD LEFT HAND   Final   Special Requests BOTTLES DRAWN AEROBIC ONLY 4CC   Final   Culture  Setup Time     Final   Value: 10/06/2013 08:29     Performed at [facility]   Culture     Final   Value: GRAM POSITIVE COCCI IN CHAINS     Note: Gram Stain Report Called to,Read Back By and Verified With: Stephanie Acre 10/07/13 0657A [place]     Performed at [facility]   Report Status PENDING   Incomplete  CULTURE, BLOOD (ROUTINE X 2)     Status: None   Collection Time    10/06/13  1:17 AM      Result Value Ref Range Status   Specimen Description BLOOD RIGHT HAND   Final   Special Requests BOTTLES DRAWN AEROBIC ONLY 4CC   Final   Culture  Setup Time     Final   Value: 10/06/2013 08:29     Performed at [facility]   Culture     Final   Value:        BLOOD CULTURE RECEIVED NO GROWTH TO DATE CULTURE WILL BE HELD FOR 5 DAYS BEFORE ISSUING A FINAL NEGATIVE REPORT     Performed at [facility]   Report Status PENDING   Incomplete  URINE CULTURE     Status: None   Collection Time    10/06/13  1:36 AM      Result Value Ref Range Status   Specimen Description URINE, CLEAN CATCH   Final   Special Requests NONE   Final   Culture  Setup Time     Final   Value: 10/06/2013 08:37     Performed at [place]     Final   Value: >=100,000 COLONIES/ML     Performed at [facility]   Culture     Final   Value: ESCHERICHIA COLI     Performed at [facility]   Report Status PENDING   Incomplete   Organism ID, Bacteria ESCHERICHIA COLI   Final  MRSA PCR SCREENING     Status: Abnormal   Collection Time    10/06/13  3:26 PM      Result Value Ref Range Status   MRSA by PCR POSITIVE (*) NEGATIVE Final   Comment:            The GeneXpert MRSA Assay (FDA     approved for NASAL specimens     only), is one component  of a     comprehensive MRSA colonization     surveillance program. It is not     intended to diagnose  MRSA     infection nor to guide or     monitor treatment for     MRSA infections.     RESULT CALLED TO, READ BACK BY AND VERIFIED WITH:     S. WATERS RN 17:20 10/06/13 ([email or web address])   Studies/Results: Ct Abdomen Pelvis W Contrast  10/07/2013   CLINICAL DATA:  Nausea and vomiting ; liver mass and pelvic mass on ultrasound of today's date  EXAM: CT ABDOMEN AND PELVIS WITH CONTRAST  TECHNIQUE: Multidetector CT imaging of the abdomen and pelvis was performed using the standard protocol following bolus administration of intravenous contrast.  CONTRAST:  132mL OMNIPAQUE IOHEXOL 300 MG/ML SOLN ; the patient also received oral contrast material.  COMPARISON:  Pelvic ultrasound of today's date ; PET-CT study of November 18, 2010 as well as CT scan of the pelvis of January 19, 2013.  FINDINGS: There is a 6.6 x 5.1 cm diameter hypoechoic focus in the right hepatic lobe with peripheral enhancement. This demonstrates peripheral "Fill in" on the delayed images. There is no intrahepatic ductal dilation. The gallbladder, spleen, partially distended stomach, pancreas, adrenal glands, and kidneys exhibit no acute abnormalities. There is a small hiatal hernia. The caliber of the abdominal aorta is normal. There is no periaortic nor pericaval lymphadenopathy. The small and large bowel exhibit no evidence of ileus nor obstruction or acute inflammation.  There is a soft tissue mass in the mid pelvis at the L5-S1 level which measures 6.6 x 6.1 x 4.8 cm. It lies immediately below the aortic bifurcation but is separated from it by fat planes. It exhibits HU value of +69. This is been demonstrated as far back as a study of January 2008 at which time the patient underwent biopsy. There small amount of free pelvic fluid. A Foley catheter is present in the urinary bladder. The uterus is surgically absent. No discrete adnexal masses demonstrated.  The lumbar spine and bony pelvis are unremarkable. There is a fatty mass within the anterior  right thigh musculature which is stable. The lung bases exhibit patchy areas of increased density posteriorly which may reflect atelectasis or scarring.  IMPRESSION: 1. Findings in the right hepatic lobe are most compatible with hemangioma. There is no acute hepatobiliary nor acute urinary tract abnormality. 2. There is no acute bowel abnormality. 3. There is a stable mass just inferior to the aortic bifurcation for which the patient has undergone biopsy in 2008.   Electronically Signed   By: David  [place]   On: 10/07/2013 17:59   US Renal  10/07/2013   CLINICAL DATA:  Oliguria.  EXAM: RENAL/URINARY TRACT ULTRASOUND COMPLETE  COMPARISON:  CT scan of January 19, 2013.  FINDINGS: Right Kidney:  Length: 10.3 cm. Echogenicity within normal limits. No mass or hydronephrosis visualized.  Left Kidney:  Length: 10 cm. Echogenicity within normal limits. No mass or hydronephrosis visualized.  Bladder:  Decompressed secondary to Foley catheter.  Liver mass measuring 6.5 x 6.3 cm is noted. 6.4 x 4.9 cm mass is noted in the pelvis.  IMPRESSION: Kidneys appear normal. 6.5 cm liver mass is noted as well as 6.4 cm mass seen in the pelvis. Further evaluation with CT scan of the abdomen and pelvis with contrast is recommended.   Electronically Signed   By: Sabino Dick M.D.   On: 10/07/2013 10:00   Medications:  I have reviewed the patient's current medications. Scheduled Meds: . carvedilol  25 mg Oral BID WC  . cefTRIAXone (ROCEPHIN)  IV  1 g Intravenous Q24H  . Chlorhexidine Gluconate Cloth  6 each Topical Q0600  . FLUoxetine  10 mg Oral Daily  . insulin aspart  0-15 Units Subcutaneous TID WC  . levETIRAcetam  750 mg Oral BID  . lisinopril  5 mg Oral Daily  . mupirocin ointment  1 application Nasal BID  . [START ON 10/09/2013] pantoprazole  40 mg Oral Daily  . rivaroxaban  20 mg Oral QAC supper  . senna-docusate  1 tablet Oral BID  . sodium chloride  3 mL Intravenous Q12H   Continuous Infusions:   PRN  Meds:.ondansetron (ZOFRAN) IV, ondansetron  80 yo female with hx of seizures, CHF, HTN, DM2 here after witnessed seizure by EMS, found to have UTI and AKI.   Assessment/Plan:  Seizure and confusion- Underwent resection of 8cm R grade 2 meningioma 03/02/2013. Also history of CVA in 2006. CT head without acute intracranial process. Seizure maybe 2/2 infection but also has risk from previous brain tumor possible. UDS negative. Spoke to Dr. Nicole Kindred and Dr. Leonel Ramsay neuro. Was keppra 500mg  bid at home.  -tolerating regualr diet. -continue keppra 7550mg  BID -Outpatient follow up with radiation oncology  UTI - ucx shows pansensitive ecoli.   -bcx positive from 8/21 1/2 positive for GPC in chais, Identification pending but likely alpha hemolytic strep per conversaion with micro lab.  - got rocephin here for possible urosepsis. BP is normal-high now. Will send home with keflex 14 days - to cover for strep in blood and ecoli UTI. Will talk to ID to make sure this is ok. - repeat bcx today. Needs to be followed up after discharge.  HTN: Initially hypotensive SBP 80's but responded to fluid bolus. -initially held home Coreg 25 mg BID, lasix 20mg  qdaily, and lisinopril 5mg  daily.  Now hypertensive 170's.   CHF: LV EF 31-54%, grade 1 diastolic dysfunction. Does not appear to be in acute exacerbation as she denies SOB and her minimal LE edema is stable. CXR with no effusions. EKG with nonspecific ST changes.  -trops neg - cont coreg 25mg  BID home dose.  - held ASA 81mg  daily for GI bleed - will restart if no longer bleeding - held simvastatin 20mg  daily - can restart on discharge.  AKI on CKD stage 2: now resolved.  DM2 poorly controlled last hgb A1c 03/28/2013 9.7. 10/06/13 is 11.  On Novolog SSI and Lantus 21u QHS at home -Accuchecks QID, SSI.   FEN:  - carb mod.  - sennokot for constipation.  DVT ppx: SCDs.   Dispo: Disposition is deferred at this time, awaiting improvement of current  medical problems.  Anticipated discharge in approximately 2-3 day(s).   The patient does have a current PCP Wilber Oliphant, MD) and does need an [facility] hospital follow-up appointment after discharge.  The patient does not know have transportation limitations that hinder transportation to clinic appointments.  .Services Needed at time of discharge: Y = Yes, Blank = No PT:   OT:   RN:   Equipment:   Other:     LOS: 2 days   Dellia Nims, MD 10/08/2013, 10:58 AM

## 2013-10-08 NOTE — Discharge Summary (Signed)
Name: [name] MRN: [id number] DOB: 02-25-1933 80 y.o. PCP: Wilber Oliphant, MD  Date of Admission: 10/06/2013 12:39 AM Date of Discharge: 10/08/2013 Attending Physician: Bartholomew Crews, MD  Discharge Diagnosis: 1.  Principal Problem:   Seizure Active Problems:   DIABETES MELLITUS, TYPE II   HYPERTENSION   Meningioma   Chronic systolic congestive heart failure   Right upper lobe, Adenocarcinoma of lung   AKI (acute kidney injury)   Iron deficiency anemia, unspecified   Sepsis   UTI (lower urinary tract infection)  Discharge Medications:   Medication List    STOP taking these medications       rivaroxaban 20 MG Tabs tablet  Commonly known as:  XARELTO      TAKE these medications       albuterol (2.5 MG/3ML) 0.083% nebulizer solution  Commonly known as:  PROVENTIL  Take 3 mLs (2.5 mg total) by nebulization every 6 (six) hours as needed for wheezing.     albuterol 108 (90 BASE) MCG/ACT inhaler  Commonly known as:  PROVENTIL HFA;VENTOLIN HFA  Inhale 2 puffs into the lungs every 4 (four) hours as needed for wheezing.     amoxicillin 500 MG capsule  Commonly known as:  AMOXIL  Take 1 capsule (500 mg total) by mouth 3 (three) times daily.     aspirin 81 MG tablet  Take 81 mg by mouth daily.     carvedilol 25 MG tablet  Commonly known as:  COREG  Take 1 tablet (25 mg total) by mouth 2 (two) times daily with a meal.     FLUoxetine 10 MG capsule  Commonly known as:  PROZAC  Take 1 capsule (10 mg total) by mouth at bedtime.     furosemide 20 MG tablet  Commonly known as:  LASIX  Take 1 tablet (20 mg total) by mouth daily.     insulin aspart 100 UNIT/ML FlexPen  Commonly known as:  NOVOLOG FLEXPEN  - Take three times with meals   - <70    2 units if going to eat- skip if going to skip meal   -  71-140    4 units Novolog   -  141-210  6 units Novolog   -  211-280  8 units     Insulin Glargine 100 UNIT/ML Solostar Pen  Commonly known as:   LANTUS SOLOSTAR  Inject 21 Units into the skin at bedtime.     iron polysaccharides 150 MG capsule  Commonly known as:  NIFEREX  Take 1 capsule (150 mg total) by mouth daily.     levETIRAcetam 750 MG tablet  Commonly known as:  KEPPRA  Take 1 tablet (750 mg total) by mouth 2 (two) times daily.     lisinopril 5 MG tablet  Commonly known as:  PRINIVIL,ZESTRIL  Take 1 tablet (5 mg total) by mouth daily.     pantoprazole 20 MG tablet  Commonly known as:  PROTONIX  Take 20 mg by mouth daily.     senna-docusate 8.6-50 MG per tablet  Commonly known as:  Senokot-S  Take 1 tablet by mouth 2 (two) times daily.     simvastatin 20 MG tablet  Commonly known as:  ZOCOR  Take 20 mg by mouth daily.        Disposition and follow-up:   [name] was discharged from [facility] in Stable condition.  At the hospital follow up visit please address:  1.  Blood  culture Needs to be followed up after discharge. Also please adjust her insulin dose as her DM II is very uncontrolled.     We stopped xarelto since she was treated appropriately for provoke PE and was having hemetemesis and melena. These resolved.  She can continue aspirin.  We started fluoxetine10mg  here for possible depression. Please re-assess need for this and for improvement.  We increased keppra dose to 750mg  BID.  2.  Labs / imaging needed at time of follow-up: [place].  3.  Pending labs/ test needing follow-up:   Follow-up Appointments: Follow-up Information   Schedule an appointment as soon as possible for a visit with Jerene Pitch, MD.   Specialty:  Internal Medicine   Contact information:   [place] [place] [address] [phone]       Discharge Instructions:   Consultations:    Procedures Performed:  Ct Head Wo Contrast  10/06/2013   CLINICAL DATA:  Seizure, confusion. Status post meningioma resection.  EXAM: CT HEAD WITHOUT CONTRAST  TECHNIQUE: Contiguous axial images  were obtained from the base of the skull through the vertex without intravenous contrast.  COMPARISON:  MRI of the brain September 08, 2013 and CT of the head August 01, 2013  FINDINGS: Status post bifrontal craniotomy for reported resection of meningioma. Right frontal transcortical cystic encephalomalacia with mild ex vacuo dilatation of frontal horn of the right lateral ventricle, unchanged. Stable appearance of the ventricles, no hydrocephalus. Stable appearance the right posterior basal ganglia/internal capsule cystic lacunar infarct. No intraparenchymal hemorrhage, mass effect, midline shift or acute large vascular territory infarct. Next  No abnormal extra-axial fluid collections. Moderate calcific atherosclerosis of the carotid siphons.  No skull fracture. Mild paranasal sinus mucosal thickening without air-fluid levels, frothy secretions in right frontal sinus. Mastoid air cells are well aerated. Mild temporomandibular osteoarthrosis.  IMPRESSION: No acute intracranial process.  Bifrontal craniotomy, with similar underlying right frontal encephalomalacia.  Remote right basal ganglia/internal capsule lacunar infarct.  Mild acute on chronic paranasal sinusitis.   Electronically Signed   By: Elon Alas   On: 10/06/2013 03:41   Mr Brain W Wo Contrast  09/08/2013   CLINICAL DATA:  Right frontal meningioma resection March 02, 2013. Lung cancer 2012.  EXAM: MRI HEAD WITHOUT AND WITH CONTRAST  TECHNIQUE: Multiplanar, multiecho pulse sequences of the brain and surrounding structures were obtained without and with intravenous contrast.  CONTRAST:  96mL MULTIHANCE GADOBENATE DIMEGLUMINE 529 MG/ML IV SOLN  COMPARISON:  MRI 06/09/2013, 02/25/2013  FINDINGS: Current study is of better quality with less motion compared with 06/09/2013  Right frontal craniotomy for resection of meningioma. There is improvement in enhancement in the tumor bed which now has a linear appearance and is most likely postop enhancement.  There is also considerable dural thickening and enhancement in the right frontal region similar to the prior study and most consistent with postop enhancement. There is postop encephalomalacia in the right frontal lobe from resection. Postop fluid collection and extra-axial blood in the right frontal region has largely resolved in the interval.  6 mm meningioma on the tentorium on the right is unchanged.  Generalized atrophy with ventricular prominence is stable. Chronic infarct in the right basal ganglia is stable.  Negative for acute infarct.  No shift of the midline structures.  IMPRESSION: Continued improvement in postop changes after resection of right frontal meningioma. Postop type enhancement in the surgical bed shows interval improvement. Postop dural enhancement again noted. No definite residual or recurrent tumor. Continue  follow-up recommended  6 mm right tentorial meningioma is stable.   Electronically Signed   By: Franchot Gallo M.D.   On: 09/08/2013 16:48   Ct Abdomen Pelvis W Contrast  10/07/2013   CLINICAL DATA:  Nausea and vomiting ; liver mass and pelvic mass on ultrasound of today's date  EXAM: CT ABDOMEN AND PELVIS WITH CONTRAST  TECHNIQUE: Multidetector CT imaging of the abdomen and pelvis was performed using the standard protocol following bolus administration of intravenous contrast.  CONTRAST:  135mL OMNIPAQUE IOHEXOL 300 MG/ML SOLN ; the patient also received oral contrast material.  COMPARISON:  Pelvic ultrasound of today's date ; PET-CT study of November 18, 2010 as well as CT scan of the pelvis of January 19, 2013.  FINDINGS: There is a 6.6 x 5.1 cm diameter hypoechoic focus in the right hepatic lobe with peripheral enhancement. This demonstrates peripheral "Fill in" on the delayed images. There is no intrahepatic ductal dilation. The gallbladder, spleen, partially distended stomach, pancreas, adrenal glands, and kidneys exhibit no acute abnormalities. There is a small hiatal hernia.  The caliber of the abdominal aorta is normal. There is no periaortic nor pericaval lymphadenopathy. The small and large bowel exhibit no evidence of ileus nor obstruction or acute inflammation.  There is a soft tissue mass in the mid pelvis at the L5-S1 level which measures 6.6 x 6.1 x 4.8 cm. It lies immediately below the aortic bifurcation but is separated from it by fat planes. It exhibits HU value of +69. This is been demonstrated as far back as a study of January 2008 at which time the patient underwent biopsy. There small amount of free pelvic fluid. A Foley catheter is present in the urinary bladder. The uterus is surgically absent. No discrete adnexal masses demonstrated.  The lumbar spine and bony pelvis are unremarkable. There is a fatty mass within the anterior right thigh musculature which is stable. The lung bases exhibit patchy areas of increased density posteriorly which may reflect atelectasis or scarring.  IMPRESSION: 1. Findings in the right hepatic lobe are most compatible with hemangioma. There is no acute hepatobiliary nor acute urinary tract abnormality. 2. There is no acute bowel abnormality. 3. There is a stable mass just inferior to the aortic bifurcation for which the patient has undergone biopsy in 2008.   Electronically Signed   By: David  Martinique   On: 10/07/2013 17:59   US Renal  10/07/2013   CLINICAL DATA:  Oliguria.  EXAM: RENAL/URINARY TRACT ULTRASOUND COMPLETE  COMPARISON:  CT scan of January 19, 2013.  FINDINGS: Right Kidney:  Length: 10.3 cm. Echogenicity within normal limits. No mass or hydronephrosis visualized.  Left Kidney:  Length: 10 cm. Echogenicity within normal limits. No mass or hydronephrosis visualized.  Bladder:  Decompressed secondary to Foley catheter.  Liver mass measuring 6.5 x 6.3 cm is noted. 6.4 x 4.9 cm mass is noted in the pelvis.  IMPRESSION: Kidneys appear normal. 6.5 cm liver mass is noted as well as 6.4 cm mass seen in the pelvis. Further evaluation  with CT scan of the abdomen and pelvis with contrast is recommended.   Electronically Signed   By: Sabino Dick M.D.   On: 10/07/2013 10:00   Dg Chest Port 1 View  10/06/2013   CLINICAL DATA:  Seizure and hematemesis  EXAM: PORTABLE CHEST - 1 VIEW  COMPARISON:  10/06/2013  FINDINGS: There is mild cardiomegaly which is stable from prior. Stable mild aortic tortuosity. There is chronic right perihilar bandlike  opacity which correlates with atelectasis or scar on previous imaging. There are chain sutures also noted in this region. There is no edema, consolidation, effusion, or pneumothorax.  IMPRESSION: 1. No acute findings. 2. Right perihilar scarring in the region of wedge resection.   Electronically Signed   By: Jorje Guild M.D.   On: 10/06/2013 05:04   Dg Chest Port 1 View  10/06/2013   CLINICAL DATA:  Seizure.  EXAM: PORTABLE CHEST - 1 VIEW  COMPARISON:  04/05/2013.  FINDINGS: The heart is borderline enlarged but stable. The mediastinal and hilar contours are unchanged. There is tortuosity of the thoracic aorta. The lungs are clear except for streaky left basilar atelectasis. No effusion or edema. The bony thorax is intact.  IMPRESSION: Streaky left basilar atelectasis but no infiltrates or effusions.   Electronically Signed   By: Kalman Jewels M.D.   On: 10/06/2013 01:16    2D Echo:   Cardiac Cath:   Admission HPI:   Ms. Ruberg is a 80 year old woman with history of seizure 02/25/2013, 8cm R grade 2 meningioma s/p resection 03/02/2013, CVA without residual deficits in 2006, CHF, HTN, GERD, DM2, RUL adenoCA s/p resection 2012, PE 04/06/2013 on Xarelto presenting with seizure witnessed by EMS. She does not remember the event and does not believe she had loss of consciousness. She reports chills, frontal HA x1 week, hemetemesis x 1 en route to hospital, melena since discharge from hospitalization, dysuria, increased urinary frequency, and bilateral LE weakness. Per daughter, patient has incontinence  of urine - thought to be purposeful. She also reports that the patient appears to be apathetic. She denies fevers, chest pain, SOB, abdominal pain, diarrhea, hematochezia, suprapubic pain, LH, dizziness, paresthesias, LE edema.  In the ED, pt was 98.8F, RR 20, HR 74, BP 81/45. She was given 2L NS bolus and her BP increased to 118/76. UA had positive nitrite, moderate leukocytes, many squamous epithelium, many bacteria. She was given 2g ceftriaxone.   Hospital Course by problem list:  Seizure and confusion- Underwent resection of 8cm R grade 2 meningioma 03/02/2013. Also history of CVA in 2006. CT head without acute intracranial process. EEG no seizure but showed diffuse slowing indicating encephalopathy.  Seizure maybe 2/2 infection but also has risk from previous brain tumor possible. UDS negative. Spoke to Dr. Nicole Kindred and Dr. Leonel Ramsay neuro.  Was keppra 500mg  bid at home.  -continued keppra 7550mg  BID. No seizure here. Was able to safely tolerate regular diet.  -Outpatient follow up with radiation oncology   UTI - ucx shows pansensitive ecoli.  -bcx positive from 8/21 1/2 positive for GPC in [place], Identification pending but likely alpha hemolytic strep per conversaion with micro lab.  - got rocephin here for possible urosepsis. BP is normal-high now. Will send home with amoxicillin 500mg  TID for 14 days - to cover for strep in blood and ecoli UTI.   - repeated bcx today. Needs to be followed up after discharge.   HTN: Initially hypotensive SBP 80's but responded to fluid bolus. -initially held home Coreg 25 mg BID, lasix 20mg  qdaily, and lisinopril 5mg  daily.  Now hypertensive 170's.   Depression - patient seems to have mental slowing maybe due to depression.  -started fluoxetine 10mg  daily.  CHF: LV EF 16-10%, grade 1 diastolic dysfunction. Does not appear to be in acute exacerbation as she denies SOB and her minimal LE edema is stable. CXR with no effusions. EKG with nonspecific ST  changes.  -trops neg  - cont coreg  25mg  BID home dose.  - held ASA 81mg  daily for GI bleed - restart since no longer bleeding - held simvastatin 20mg  daily - can restart on discharge.  AKI on CKD stage 2: now resolved with fluids.   DM2 poorly controlled last hgb A1c 03/28/2013 9.7. 10/06/13 is 11.  On Novolog SSI and Lantus 21u QHS at home  Was on SSI here. Needs to be assessed outpatient.   Discharge Vitals:   BP 170/57  Pulse 70  Temp(Src) 98 F (36.7 C) (Oral)  Resp 19  Ht 5\' 4"  (1.626 m)  Wt 94.5 kg (208 lb 5.4 oz)  BMI 35.74 kg/m2  SpO2 100%  Discharge Labs:  Results for orders placed during the hospital encounter of 10/06/13 (from the past 24 hour(s))  GLUCOSE, CAPILLARY     Status: Abnormal   Collection Time    10/07/13  4:32 PM      Result Value Ref Range   Glucose-Capillary 220 (*) 70 - 99 mg/dL  GLUCOSE, CAPILLARY     Status: Abnormal   Collection Time    10/07/13  9:56 PM      Result Value Ref Range   Glucose-Capillary 174 (*) 70 - 99 mg/dL  GLUCOSE, CAPILLARY     Status: Abnormal   Collection Time    10/08/13  7:49 AM      Result Value Ref Range   Glucose-Capillary 160 (*) 70 - 99 mg/dL  GLUCOSE, CAPILLARY     Status: Abnormal   Collection Time    10/08/13 12:51 PM      Result Value Ref Range   Glucose-Capillary 217 (*) 70 - 99 mg/dL    Signed: Dellia Nims, MD 10/08/2013, 2:43 PM    Services Ordered on Discharge:  Equipment Ordered on Discharge:

## 2013-10-09 LAB — GLUCOSE, CAPILLARY: Glucose-Capillary: 161 mg/dL — ABNORMAL HIGH (ref 70–99)

## 2013-10-10 LAB — CULTURE, BLOOD (ROUTINE X 2)

## 2013-10-12 LAB — CULTURE, BLOOD (ROUTINE X 2): Culture: NO GROWTH

## 2013-10-13 ENCOUNTER — Other Ambulatory Visit: Payer: Self-pay | Admitting: Internal Medicine

## 2013-10-13 ENCOUNTER — Encounter: Payer: Self-pay | Admitting: Internal Medicine

## 2013-10-13 ENCOUNTER — Ambulatory Visit (INDEPENDENT_AMBULATORY_CARE_PROVIDER_SITE_OTHER): Payer: Medicare Other | Admitting: Internal Medicine

## 2013-10-13 VITALS — BP 163/68 | HR 71 | Temp 98.1°F | Ht 64.0 in | Wt 204.9 lb

## 2013-10-13 DIAGNOSIS — M25569 Pain in unspecified knee: Secondary | ICD-10-CM

## 2013-10-13 DIAGNOSIS — I2699 Other pulmonary embolism without acute cor pulmonale: Secondary | ICD-10-CM | POA: Diagnosis not present

## 2013-10-13 DIAGNOSIS — I10 Essential (primary) hypertension: Secondary | ICD-10-CM | POA: Diagnosis not present

## 2013-10-13 DIAGNOSIS — M545 Low back pain, unspecified: Secondary | ICD-10-CM | POA: Diagnosis not present

## 2013-10-13 DIAGNOSIS — R569 Unspecified convulsions: Secondary | ICD-10-CM

## 2013-10-13 DIAGNOSIS — E119 Type 2 diabetes mellitus without complications: Secondary | ICD-10-CM

## 2013-10-13 DIAGNOSIS — N3000 Acute cystitis without hematuria: Secondary | ICD-10-CM

## 2013-10-13 DIAGNOSIS — Z9181 History of falling: Secondary | ICD-10-CM

## 2013-10-13 DIAGNOSIS — E785 Hyperlipidemia, unspecified: Secondary | ICD-10-CM | POA: Diagnosis not present

## 2013-10-13 DIAGNOSIS — M25561 Pain in right knee: Secondary | ICD-10-CM

## 2013-10-13 LAB — LIPID PANEL
CHOL/HDL RATIO: 4 ratio
Cholesterol: 165 mg/dL (ref 0–200)
HDL: 41 mg/dL (ref >39)
LDL Cholesterol: 101 mg/dL — ABNORMAL HIGH (ref 0–99)
Triglycerides: 115 mg/dL (ref <150)
VLDL: 23 mg/dL (ref 0–40)

## 2013-10-13 LAB — CBC
HCT: 32 % — ABNORMAL LOW (ref 36.0–46.0)
Hemoglobin: 10.1 g/dL — ABNORMAL LOW (ref 12.0–15.0)
MCH: 23.2 pg — ABNORMAL LOW (ref 26.0–34.0)
MCHC: 31.6 g/dL (ref 30.0–36.0)
MCV: 73.4 fL — ABNORMAL LOW (ref 78.0–100.0)
PLATELETS: 318 10*3/uL (ref 150–400)
RBC: 4.36 MIL/uL (ref 3.87–5.11)
RDW: 16.8 % — ABNORMAL HIGH (ref 11.5–15.5)
WBC: 6.2 10*3/uL (ref 4.0–10.5)

## 2013-10-13 LAB — GLUCOSE, CAPILLARY: Glucose-Capillary: 193 mg/dL — ABNORMAL HIGH (ref 70–99)

## 2013-10-13 MED ORDER — ACETAMINOPHEN 500 MG PO TABS: 1000.0000 mg | ORAL_TABLET | Freq: Three times a day (TID) | ORAL | Status: DC | PRN

## 2013-10-13 MED ORDER — LISINOPRIL 5 MG PO TABS: 10.0000 mg | ORAL_TABLET | Freq: Every day | ORAL | Status: DC

## 2013-10-13 NOTE — Patient Instructions (Addendum)
It was a pleasure taking care of you today, [name].  1. STOP Xarelto. Patient had gastrointestinal bleeding in the hospital.  2. High Blood Pressure - Start Lisinopril 10 mg daily  3. Back/Knee Pain - Start Tylenol 1g every 8 hours as needed - Use hot compresses  General Instructions:   Please bring your medicines with you each time you come to clinic.  Medicines may include prescription medications, over-the-counter medications, herbal remedies, eye drops, vitamins, or other pills.   Progress Toward Treatment Goals:  Treatment Goal 03/28/2013  Hemoglobin A1C improved  Blood pressure at goal    Self Care Goals & Plans:  Self Care Goal 10/13/2013  Manage my medications take my medicines as prescribed; bring my medications to every visit; refill my medications on time  Monitor my health keep track of my blood glucose  Eat healthy foods drink diet soda or water instead of juice or soda; eat more vegetables; eat foods that are low in salt; eat baked foods instead of fried foods; eat fruit for snacks and desserts    Home Blood Glucose Monitoring 03/28/2013  Check my blood sugar 3 times a day  When to check my blood sugar before meals     Care Management & Community Referrals:  No flowsheet data found.

## 2013-10-14 DIAGNOSIS — M545 Low back pain, unspecified: Secondary | ICD-10-CM | POA: Insufficient documentation

## 2013-10-14 DIAGNOSIS — M25561 Pain in right knee: Secondary | ICD-10-CM | POA: Insufficient documentation

## 2013-10-14 LAB — CULTURE, BLOOD (ROUTINE X 2)
Culture: NO GROWTH
Culture: NO GROWTH

## 2013-10-14 NOTE — Assessment & Plan Note (Signed)
Pt complaining of low back pain that started during hospitalization on 8/21. Will not give narcotics because of concern of lowering seizure threshold and age. - Tylenol 1000 mg TID as needed - Recommended using heat pads

## 2013-10-14 NOTE — Progress Notes (Signed)
Subjective:    Patient ID: [name], female    DOB: 1933-07-14, 80 y.o.   MRN: [id number]  HPI [name] is a 80yo woman w/ PMHx of HTN, systolic CHF, hemangioma of the liver noted in 2012, stage IB adenocarcinoma of RUL of lung s/p resection in 2012, DM Type 2, CVA in 2006, cerebral meningioma s/p resection in 02/2013, hx of pulmonary embolism in 02/2013 on Xarelto, and history of seizures who presents today for a hospital follow-up.  Pt was hospitalized from 8/21-8/23 after having a seizure at home that was possibly due to sepsis from a urinary tract infection. Her urine culture was positive for pansensitive E.coli and Strep agalactiae and 1 out of 2 blood cultures grew Strep Viridans. Repeat blood cultures on 8/23 had no growth. Pt received Rocephin in the hospital and was discharged with Amoxicillin 500 mg TID for 14 days (end date: 10/21/13). EEG performed in the hospital showed no evidence of seizure, but did show diffuse slowing indicating encephalopathy. On discharge, her Keppra was increased to 750 mg BID.   Pt also found to have hematemesis and melena during her hospitalization. Pt was previously on Xarelto 20 mg daily for PE in January 2015. Her Xarelto was discontinued during her hospitalization and upon discharge.  Today, patient reports she has not had any seizures since her discharge home. Her daughter who accompanied her to the visit today confirms this. She has been taking her Keppra 750 mg BID. Pt has also been taking Amoxicillin 500 mg TID as instructed and her course will be completed on 9/5. Her daughter reports she helps the patient with her medications. She denies fever, chills, abdominal pain, dysuria, hematuria, and frequency. When asked about Xarelto, both the patient and daughter state that the patient has been taking this medication since discharge. Both deny being told to stop the Xarelto during her admission. Both were instructed to stop this medication immediately.  Patient denies episodes of hematemesis, but is not sure if she is having melena because she does not look at her stool.   Patient's BP today is elevated at 163/68. She takes Lisinopril 5 mg daily, Lasix 20 mg daily, and Coreg 25 mg BID.  Patient's Type 2 DM is not well controlled. HbA1c in the hospital (8/21) was 11.0. She takes Lantus 21 units at bedtime and has a Novolog sliding scale: BG <70, if eating 2 units BG 70-140 4 units BG 141-210 6 units BG 211-280 8 units She denies polyuria, polydipsia, and tingling/numbness in her extremities.  For her hyperlipidemia, her last lipid profile was in 10/2010 that showed Chol 213, Trigly 65, HDL 53, LDL 147. She takes Simvastatin 20 mg daily.  Patient complaining of lower back pain and right knee pain. She states her back pain started during her hospital admission. She describes the back pain as 9/10 in severity at times, intermittent, and does not change with position. She states her right knee pain has been ongoing for several months. Her knee pain is 9/10 in severity, worse when bearing weight on her right knee, and achy. She uses a walker at home and states the pain is the worst when she is trying to stand up and use her walker. She has not tried any OTC medications for her pain.   Review of Systems General: Denies night sweats, changes in weight, changes in appetite HEENT: Denies headaches, ear pain, changes in vision, rhinorrhea, sore throat CV: Denies CP, palpitations, SOB, orthopnea Pulm: Denies SOB, cough,  wheezing GI: See HPI GU: See HPI Msk: Denies muscle cramps, joint pains Neuro: Denies weakness Skin: Denies rashes, bruising    Objective:   Physical Exam General: elderly woman sitting up in chair, NAD HEENT: [place]/AT, EOMI, PERRL, sclera anicteric, pharynx non-erythematous, mucus membranes moist Neck: supple, no JVD, no lymphadenopathy CV: RRR, normal S1/S2, no m/g/r Pulm: CTA bilaterally, breaths non-labored, no wheezing Abd:  BS+, soft, non-distended, non-tender, no CVA tenderness, no suprapubic tenderness Ext: warm, no edema. Mild tenderness to palpation of right knee.  Neuro: alert and oriented x 3, CNs II-XII intact, strength 5/5 in upper and lower extremities bilaterally       Assessment & Plan:

## 2013-10-14 NOTE — Assessment & Plan Note (Signed)
Pt had hematemesis and melena on admission to hospital on 8/21. Xarelto discontinued at discharge. Pt was apparently still taking Xarelto after discharge because she did not know that she was supposed to stop this medication. - Pt and family instructed to stop Xarelto at this time

## 2013-10-14 NOTE — Assessment & Plan Note (Signed)
Pt finishing Amoxicillin 500 mg TID on 10/21/13 for UTI during hospital admission. Pt asymptomatic at this time.

## 2013-10-14 NOTE — Assessment & Plan Note (Signed)
BP Readings from Last 3 Encounters:  10/13/13 163/68  10/08/13 170/57  06/29/13 144/76    Lab Results  Component Value Date   NA 144 10/06/2013   K 4.0 10/06/2013   CREATININE 0.82 10/06/2013    Assessment: Blood pressure control: mildly elevated Progress toward BP goal:  unchanged Comments: BP remains slightly elevated.  Plan: Medications:  Increase Lisinopril to 10 mg daily. Continue Coreg 25 mg BID and Lasix 20 mg daily. Educational resources provided: brochure Self management tools provided:   Other plans:

## 2013-10-14 NOTE — Assessment & Plan Note (Signed)
Pt complaining of right knee pain that has been ongoing for a few months. - Tylenol 1000 mg TID as needed - Recommended using heat pads

## 2013-10-14 NOTE — Assessment & Plan Note (Addendum)
Lab Results  Component Value Date   HGBA1C 11.0* 10/06/2013   HGBA1C 9.7 03/28/2013   HGBA1C 12.1 12/23/2012     Assessment: Diabetes control: poor control (HgbA1C >9%) Progress toward A1C goal:  deteriorated Comments: Pt continues to have poor control of her DM. She did not bring her log or meter to the visit today so I was unable to assess her blood sugars. No changes were made to her insulin regimen because I do not want her to become hypoglycemic.   Plan: Medications:  continue current medications Home glucose monitoring: Frequency:   Timing:   Instruction/counseling given: reminded to bring blood glucose meter & log to each visit, reminded to bring medications to each visit and discussed foot care Educational resources provided: brochure Self management tools provided:   Other plans: Reminded patient and family to bring log book/meter to every visit so that we can see how her blood sugars are doing and adjust insulin as necessary. Will not adjust insulin at this time because of concern of causing hypoglycemia, but this should be addressed with her next PCP visit.  - Continue Lantus 21 units QHS - Continue Novolog sliding scale: BG <70, if eating 2 units BG 70-140 4 units BG 141-210 6 units BG 211-280 8 units   - Ordered urine microalbumin but pt did not provide urine sample- repeat at next visit  - Foot exam at next visit

## 2013-10-14 NOTE — Assessment & Plan Note (Signed)
-   Repeat lipid panel today - Continue Simvastatin 20 mg daily

## 2013-10-14 NOTE — Assessment & Plan Note (Signed)
No reported seizures since discharge from hospital on 10/08/13. - Continue Keppra 750 mg BID

## 2013-10-16 ENCOUNTER — Telehealth: Payer: Self-pay | Admitting: Licensed Clinical Social Worker

## 2013-10-16 NOTE — Telephone Encounter (Signed)
Pt also in need of [facility] RN and HH PT was recommending during hospitalization.

## 2013-10-16 NOTE — Telephone Encounter (Signed)
Ms. Everhart was referred to [place] as physician states family asking for 24 hour nursing care.  CSW will inquire if pt has PCS as request was submitted in April of this year.  CSW placed called to pt.  CSW left message requesting return call. CSW provided contact hours and phone number.

## 2013-10-17 NOTE — Progress Notes (Signed)
Internal Medicine Clinic Attending Date of visit: 10/13/2013  I saw and evaluated the patient.  I personally confirmed the key portions of the history and exam documented by Dr. Arcelia Jew and I reviewed pertinent patient test results.  The assessment, diagnosis, and plan were formulated together and I agree with the documentation in the resident's note.

## 2013-10-18 NOTE — Telephone Encounter (Signed)
CSW placed called to pt.  CSW left message requesting return call. CSW provided contact hours and phone number. 

## 2013-10-19 NOTE — Telephone Encounter (Signed)
CSW received message from pt's daughter, after business hours.  CSW returned call.  Daughter states hospital recommended 24 hour care, CSW discussed with daughter generally if a patient is living with an adult that is not employed outside of the home this can be considered 24 hr care or pt can be discharged to a SNF.  Daughter states she is there the majority of the time, but she does have children.  Pt does receive 2.5 hours of PCS/day, which is close to the max of 80.  CSW inquired if [name] had a change in medical status that would warrant additional hours.  Daughter cited only the most recent hospitalization.  [name] has an appointment scheduled with her PCP at the end of this month, CSW suggested to discuss any change of medical status with PCP for a PCS assessment based on change in medical status.  Daughter in agreement with [facility] RN services, pt has used [facility] in the past and family was satisfied.  HH PT recommended during hospital stay.  CSW sent referral to [facility].

## 2013-10-22 DIAGNOSIS — Z85118 Personal history of other malignant neoplasm of bronchus and lung: Secondary | ICD-10-CM | POA: Diagnosis not present

## 2013-10-22 DIAGNOSIS — D376 Neoplasm of uncertain behavior of liver, gallbladder and bile ducts: Secondary | ICD-10-CM | POA: Diagnosis not present

## 2013-10-22 DIAGNOSIS — E119 Type 2 diabetes mellitus without complications: Secondary | ICD-10-CM | POA: Diagnosis not present

## 2013-10-22 DIAGNOSIS — I10 Essential (primary) hypertension: Secondary | ICD-10-CM | POA: Diagnosis not present

## 2013-10-22 DIAGNOSIS — G40909 Epilepsy, unspecified, not intractable, without status epilepticus: Secondary | ICD-10-CM | POA: Diagnosis not present

## 2013-10-22 DIAGNOSIS — I5022 Chronic systolic (congestive) heart failure: Secondary | ICD-10-CM | POA: Diagnosis not present

## 2013-10-22 DIAGNOSIS — Z9119 Patient's noncompliance with other medical treatment and regimen: Secondary | ICD-10-CM | POA: Diagnosis not present

## 2013-10-22 DIAGNOSIS — Z7901 Long term (current) use of anticoagulants: Secondary | ICD-10-CM | POA: Diagnosis not present

## 2013-10-22 DIAGNOSIS — Z794 Long term (current) use of insulin: Secondary | ICD-10-CM | POA: Diagnosis not present

## 2013-10-22 DIAGNOSIS — Z86711 Personal history of pulmonary embolism: Secondary | ICD-10-CM | POA: Diagnosis not present

## 2013-10-25 DIAGNOSIS — G40909 Epilepsy, unspecified, not intractable, without status epilepticus: Secondary | ICD-10-CM | POA: Diagnosis not present

## 2013-10-25 DIAGNOSIS — I5022 Chronic systolic (congestive) heart failure: Secondary | ICD-10-CM | POA: Diagnosis not present

## 2013-10-25 DIAGNOSIS — D376 Neoplasm of uncertain behavior of liver, gallbladder and bile ducts: Secondary | ICD-10-CM | POA: Diagnosis not present

## 2013-10-25 DIAGNOSIS — I10 Essential (primary) hypertension: Secondary | ICD-10-CM | POA: Diagnosis not present

## 2013-10-25 DIAGNOSIS — E119 Type 2 diabetes mellitus without complications: Secondary | ICD-10-CM | POA: Diagnosis not present

## 2013-10-25 DIAGNOSIS — Z9119 Patient's noncompliance with other medical treatment and regimen: Secondary | ICD-10-CM | POA: Diagnosis not present

## 2013-10-26 ENCOUNTER — Telehealth: Payer: Self-pay | Admitting: *Deleted

## 2013-10-26 DIAGNOSIS — I5022 Chronic systolic (congestive) heart failure: Secondary | ICD-10-CM | POA: Diagnosis not present

## 2013-10-26 DIAGNOSIS — Z9119 Patient's noncompliance with other medical treatment and regimen: Secondary | ICD-10-CM | POA: Diagnosis not present

## 2013-10-26 DIAGNOSIS — G40909 Epilepsy, unspecified, not intractable, without status epilepticus: Secondary | ICD-10-CM | POA: Diagnosis not present

## 2013-10-26 DIAGNOSIS — E119 Type 2 diabetes mellitus without complications: Secondary | ICD-10-CM | POA: Diagnosis not present

## 2013-10-26 DIAGNOSIS — D376 Neoplasm of uncertain behavior of liver, gallbladder and bile ducts: Secondary | ICD-10-CM | POA: Diagnosis not present

## 2013-10-26 DIAGNOSIS — I10 Essential (primary) hypertension: Secondary | ICD-10-CM | POA: Diagnosis not present

## 2013-10-26 NOTE — Telephone Encounter (Signed)
HHN, calls and states family is not checking blood sugar very often, it was last checked 9/6= 175, 2 days prior it was 299, there are not any other readings in the meter. Daughter states she is overwhelmed and that she is not getting any insulin. Pt does not get get neb treatments in 2 weeks. Daughter states pt has not had any seizures. Pt is not receiving very good personal care.  Will give verbal ok for social work and possible APS.

## 2013-10-27 NOTE — Telephone Encounter (Signed)
Thank you, I definitely agree. If the daughter is overwhelmed, Does she need SNF??

## 2013-10-30 DIAGNOSIS — I5022 Chronic systolic (congestive) heart failure: Secondary | ICD-10-CM | POA: Diagnosis not present

## 2013-10-30 DIAGNOSIS — G40909 Epilepsy, unspecified, not intractable, without status epilepticus: Secondary | ICD-10-CM | POA: Diagnosis not present

## 2013-10-30 DIAGNOSIS — Z9119 Patient's noncompliance with other medical treatment and regimen: Secondary | ICD-10-CM | POA: Diagnosis not present

## 2013-10-30 DIAGNOSIS — D376 Neoplasm of uncertain behavior of liver, gallbladder and bile ducts: Secondary | ICD-10-CM | POA: Diagnosis not present

## 2013-10-30 DIAGNOSIS — E119 Type 2 diabetes mellitus without complications: Secondary | ICD-10-CM | POA: Diagnosis not present

## 2013-10-30 DIAGNOSIS — I10 Essential (primary) hypertension: Secondary | ICD-10-CM | POA: Diagnosis not present

## 2013-10-31 ENCOUNTER — Telehealth: Payer: Self-pay | Admitting: *Deleted

## 2013-10-31 NOTE — Telephone Encounter (Signed)
Another report from Margart Sickles with [facility] - # [phone]  Pt was seen 9/14 Last week pt was on toilet and singing but did not look right, EMS called pt bp 220/110 and pt refused to go to hospital.  Family feels pt had seizure  Family is not checking CBG's or BP's Nurse reports BP 161/74 pulse 74,  cbg 219 before eating.  Pt is scheduled for MRI today and lab work with Neuro.

## 2013-10-31 NOTE — Telephone Encounter (Signed)
I called pt's daughter and she states her mother is doing much better.  She staes her mother told her she did not have a seizure the other day she was just singing.   She also states her mother will not go to ED for evaluation. I did schedule her for appointment on Friday, it was the soonest time family could get pt here.

## 2013-10-31 NOTE — Telephone Encounter (Signed)
I think she needs to be seen sooner than her appt with me in 2 weeks if these are her symptoms. Can this be arranged please? I am worried about her, especially given her history. The family sounds overwhelmed and may need SNF placement and assistance.   I am at an away rotation, but perhaps discuss with attending to see what should be done next?

## 2013-10-31 NOTE — Telephone Encounter (Addendum)
Patient needs to be seen as soon as possible for further evaluation. Good that MRI is today and the patient is already following up with neuro.

## 2013-11-02 DIAGNOSIS — Z9119 Patient's noncompliance with other medical treatment and regimen: Secondary | ICD-10-CM | POA: Diagnosis not present

## 2013-11-02 DIAGNOSIS — E119 Type 2 diabetes mellitus without complications: Secondary | ICD-10-CM | POA: Diagnosis not present

## 2013-11-02 DIAGNOSIS — D376 Neoplasm of uncertain behavior of liver, gallbladder and bile ducts: Secondary | ICD-10-CM | POA: Diagnosis not present

## 2013-11-02 DIAGNOSIS — I5022 Chronic systolic (congestive) heart failure: Secondary | ICD-10-CM | POA: Diagnosis not present

## 2013-11-02 DIAGNOSIS — G40909 Epilepsy, unspecified, not intractable, without status epilepticus: Secondary | ICD-10-CM | POA: Diagnosis not present

## 2013-11-02 DIAGNOSIS — I10 Essential (primary) hypertension: Secondary | ICD-10-CM | POA: Diagnosis not present

## 2013-11-03 ENCOUNTER — Ambulatory Visit (INDEPENDENT_AMBULATORY_CARE_PROVIDER_SITE_OTHER): Payer: Medicare Other | Admitting: Internal Medicine

## 2013-11-03 ENCOUNTER — Encounter: Payer: Self-pay | Admitting: Internal Medicine

## 2013-11-03 VITALS — BP 143/68 | HR 70 | Temp 98.3°F | Ht 64.0 in | Wt 203.0 lb

## 2013-11-03 DIAGNOSIS — D320 Benign neoplasm of cerebral meninges: Secondary | ICD-10-CM

## 2013-11-03 DIAGNOSIS — K219 Gastro-esophageal reflux disease without esophagitis: Secondary | ICD-10-CM | POA: Diagnosis not present

## 2013-11-03 DIAGNOSIS — G40909 Epilepsy, unspecified, not intractable, without status epilepticus: Secondary | ICD-10-CM | POA: Diagnosis not present

## 2013-11-03 DIAGNOSIS — I10 Essential (primary) hypertension: Secondary | ICD-10-CM

## 2013-11-03 DIAGNOSIS — Z789 Other specified health status: Secondary | ICD-10-CM | POA: Diagnosis not present

## 2013-11-03 DIAGNOSIS — Z23 Encounter for immunization: Secondary | ICD-10-CM

## 2013-11-03 DIAGNOSIS — D329 Benign neoplasm of meninges, unspecified: Secondary | ICD-10-CM

## 2013-11-03 DIAGNOSIS — Z7901 Long term (current) use of anticoagulants: Secondary | ICD-10-CM | POA: Diagnosis not present

## 2013-11-03 MED ORDER — OMEPRAZOLE 20 MG PO CPDR: 20.0000 mg | DELAYED_RELEASE_CAPSULE | Freq: Every day | ORAL | Status: DC

## 2013-11-03 NOTE — Patient Instructions (Addendum)
-  Continue taking Keppra 750 mg twice per day.  -Make a follow up in 1-2 months for diabetes check. Please bring your blood glucose monitor to that visit.   Please bring your medicines with you each time you come.   Medicines may be  Eye drops  Herbal   Vitamins  Pills  Seeing these help [place] take care of you.

## 2013-11-03 NOTE — Progress Notes (Signed)
   Subjective:    Patient ID: [name], female    DOB: 1933/07/05, 80 y.o.   MRN: [id number]  HPI [name] is a 80yo woman w/ PMHx of HTN, systolic CHF, hemangioma of the liver noted in 2012, stage IB adenocarcinoma of RUL of lung s/p resection in 2012, DM 2, CVA in 2006, cerebral meningioma s/p resection in 02/2013, hx of pulmonary embolism in 02/2013 on Xarelto, and history of seizures who presents today for evaluation of home health needs.  She is accompanied by her daughter.   [name] states that she feels fine with no more seizures since her recent hospitalization in August which was thought to be 2/2 to E.coli and Strep. Agalactiae UTI. She continues to take Keppra 750mg  BID. There was one incident of her being in the bathroom humming which alarmed her granddaughter who called EMS but the it was not clear if indeed the patient had a seizure and she denied it.   Since her last [facility] visit she has stopped Xarelto with no reported increased SOB, calf pain/swelling, or hematemesis (the patient had hematemesis during her recent hospitalization and this medication had been stopped at discharge but there was some misunderstanding about this and the patient had continued taking this medication).   She had follow up with Neurosurgery and MRI for meningioma rescheduled for next week.   Se has been in contact with the [facility] CSW and now has [facility] RN and PT in addition to 2.5 h of PCS per day. She stays with her daughter she is dependent of ADLs and medication management including CBG monitoring.     Review of Systems  Constitutional: Negative for fever, chills, diaphoresis, activity change, appetite change, fatigue and unexpected weight change.  Respiratory: Negative for cough, shortness of breath and wheezing.   Cardiovascular: Negative for chest pain, palpitations and leg swelling.  Gastrointestinal: Negative for nausea, vomiting, abdominal pain, constipation and blood in stool.  Genitourinary:  Negative for dysuria and frequency.  Neurological: Negative for dizziness and light-headedness.  Psychiatric/Behavioral: Negative for confusion and agitation.       Objective:   Physical Exam  Nursing note and vitals reviewed. Constitutional: She appears well-developed and well-nourished. No distress.  Sitting in wheelchair, wearing sunglasses   Cardiovascular: Normal rate and regular rhythm.   Pulmonary/Chest: Effort normal. No respiratory distress. She has no wheezes. She has no rales.  Abdominal: Soft. She exhibits no distension.  Musculoskeletal: She exhibits no edema and no tenderness.  Neurological: She is alert.  Skin: Skin is warm and dry. No rash noted. She is not diaphoretic. No erythema.  Psychiatric: She has a normal mood and affect.          Assessment & Plan:

## 2013-11-03 NOTE — Assessment & Plan Note (Addendum)
BP Readings from Last 3 Encounters:  11/03/13 143/68  10/13/13 163/68  10/08/13 170/57    Lab Results  Component Value Date   NA 144 10/06/2013   K 4.0 10/06/2013   CREATININE 0.82 10/06/2013    Assessment: Blood pressure control:  Controlled Progress toward BP goal:   At goal Comments: she is on Coreg 25mg  BID, Lasix 20mg  daily, and Lisinopril 10mg  daily.  Plan: Medications:  continue current medications Educational resources provided:   Self management tools provided:   Other plans: Follow up in 1-2 months.

## 2013-11-03 NOTE — Assessment & Plan Note (Addendum)
No more seizure activity since her discharge to home in August 2015 (pt and her daughter deny seizure activity recently). She is on Keppra 750mg  BID and is compliant.  -May need follow up with Neurology if seizure-like activity reoccurs.

## 2013-11-03 NOTE — Assessment & Plan Note (Signed)
Received flu vaccine during this visit.  Will need diabetic foot exam during her next visit.

## 2013-11-03 NOTE — Assessment & Plan Note (Signed)
Followed by Dr. Cyndy Freeze in Neurosurgery, has f/u appointment MRI scheduled for next week for eval of meningioma recurrence.

## 2013-11-03 NOTE — Assessment & Plan Note (Signed)
She continues to have 24 hr supervision with her daughter, she has [facility] PT and RN as well as 2.5h/day of PCS. Her daughter feels like they are able to manage things better now.  -Continue monitoring. Follow up in 1-2 months.

## 2013-11-03 NOTE — Assessment & Plan Note (Signed)
Was Xarelto for PE on 02/2013 but is now off this medication due to hematemesis during hospitalization in August 2015.

## 2013-11-03 NOTE — Assessment & Plan Note (Signed)
She has recent hx of hematemesis. She has occasional heart burn.  Refilled omeprazole 20mg  daily

## 2013-11-06 NOTE — Progress Notes (Signed)
INTERNAL MEDICINE TEACHING ATTENDING ADDENDUM - Tynisa Vohs, MD: I reviewed and discussed at the time of visit with the resident Dr. Kennerly, the patient's medical history, physical examination, diagnosis and results of pertinent tests and treatment and I agree with the patient's care as documented.  

## 2013-11-07 ENCOUNTER — Encounter: Payer: Medicare Other | Admitting: Internal Medicine

## 2013-11-07 ENCOUNTER — Other Ambulatory Visit: Payer: Self-pay | Admitting: *Deleted

## 2013-11-07 NOTE — Telephone Encounter (Signed)
Pt requesting test strips for Accu-chek Aviva; need to include instructions/dx code. Please check. Thanks

## 2013-11-08 MED ORDER — GLUCOSE BLOOD VI STRP: ORAL_STRIP | Status: DC

## 2013-11-09 ENCOUNTER — Other Ambulatory Visit: Payer: Self-pay | Admitting: *Deleted

## 2013-11-09 DIAGNOSIS — E119 Type 2 diabetes mellitus without complications: Secondary | ICD-10-CM | POA: Diagnosis not present

## 2013-11-09 DIAGNOSIS — G40909 Epilepsy, unspecified, not intractable, without status epilepticus: Secondary | ICD-10-CM | POA: Diagnosis not present

## 2013-11-09 DIAGNOSIS — I10 Essential (primary) hypertension: Secondary | ICD-10-CM | POA: Diagnosis not present

## 2013-11-09 DIAGNOSIS — D376 Neoplasm of uncertain behavior of liver, gallbladder and bile ducts: Secondary | ICD-10-CM | POA: Diagnosis not present

## 2013-11-09 DIAGNOSIS — I5022 Chronic systolic (congestive) heart failure: Secondary | ICD-10-CM | POA: Diagnosis not present

## 2013-11-09 DIAGNOSIS — Z9119 Patient's noncompliance with other medical treatment and regimen: Secondary | ICD-10-CM | POA: Diagnosis not present

## 2013-11-09 MED ORDER — GLUCOSE BLOOD VI STRP: ORAL_STRIP | Status: DC

## 2013-11-09 NOTE — Telephone Encounter (Signed)
Could you please resend [facility] had computer issues and it did not come as a script it came as a message, they apologize but it will need to be sent again Also pt's HHN calls and states pt has only tested cbg twice in 1 week today cbg was 370 BP was up R- 182/100 L- 180/92 Denies seizures recently Denies h/a's

## 2013-11-14 ENCOUNTER — Other Ambulatory Visit: Payer: Self-pay | Admitting: Radiation Therapy

## 2013-11-14 ENCOUNTER — Encounter: Payer: Self-pay | Admitting: *Deleted

## 2013-11-14 ENCOUNTER — Encounter: Payer: Medicare Other | Admitting: Internal Medicine

## 2013-11-14 DIAGNOSIS — D329 Benign neoplasm of meninges, unspecified: Secondary | ICD-10-CM

## 2013-11-14 DIAGNOSIS — Z9119 Patient's noncompliance with other medical treatment and regimen: Secondary | ICD-10-CM | POA: Diagnosis not present

## 2013-11-14 DIAGNOSIS — D376 Neoplasm of uncertain behavior of liver, gallbladder and bile ducts: Secondary | ICD-10-CM | POA: Diagnosis not present

## 2013-11-14 DIAGNOSIS — E119 Type 2 diabetes mellitus without complications: Secondary | ICD-10-CM | POA: Diagnosis not present

## 2013-11-14 DIAGNOSIS — G40909 Epilepsy, unspecified, not intractable, without status epilepticus: Secondary | ICD-10-CM | POA: Diagnosis not present

## 2013-11-14 DIAGNOSIS — I10 Essential (primary) hypertension: Secondary | ICD-10-CM | POA: Diagnosis not present

## 2013-11-14 DIAGNOSIS — I5022 Chronic systolic (congestive) heart failure: Secondary | ICD-10-CM | POA: Diagnosis not present

## 2013-11-15 DIAGNOSIS — D320 Benign neoplasm of cerebral meninges: Secondary | ICD-10-CM | POA: Diagnosis not present

## 2013-11-21 DIAGNOSIS — Z9119 Patient's noncompliance with other medical treatment and regimen: Secondary | ICD-10-CM | POA: Diagnosis not present

## 2013-11-21 DIAGNOSIS — E119 Type 2 diabetes mellitus without complications: Secondary | ICD-10-CM | POA: Diagnosis not present

## 2013-11-21 DIAGNOSIS — D376 Neoplasm of uncertain behavior of liver, gallbladder and bile ducts: Secondary | ICD-10-CM | POA: Diagnosis not present

## 2013-11-21 DIAGNOSIS — I10 Essential (primary) hypertension: Secondary | ICD-10-CM | POA: Diagnosis not present

## 2013-11-21 DIAGNOSIS — I5022 Chronic systolic (congestive) heart failure: Secondary | ICD-10-CM | POA: Diagnosis not present

## 2013-11-21 DIAGNOSIS — G40909 Epilepsy, unspecified, not intractable, without status epilepticus: Secondary | ICD-10-CM | POA: Diagnosis not present

## 2013-11-22 ENCOUNTER — Telehealth: Payer: Self-pay | Admitting: *Deleted

## 2013-11-22 ENCOUNTER — Other Ambulatory Visit (HOSPITAL_COMMUNITY): Payer: Self-pay | Admitting: Internal Medicine

## 2013-11-22 NOTE — Telephone Encounter (Signed)
Call from [facility] with [facility] [phone]  Nurse reports Pt looks stable MRI was not done, will reschedule CBG's range from 104 - 313 before meals.   She does eat a lot of carbs.  Nurse is doing diet teaching. BP - 128/82 - 211/97  And 210/95 No seizure activity.

## 2013-11-23 ENCOUNTER — Telehealth: Payer: Self-pay | Admitting: *Deleted

## 2013-11-23 NOTE — Telephone Encounter (Signed)
Pt's daughter informed and she voices understanding. Also sent to D Plyler so she will keep informed of treatment for this pt.

## 2013-11-23 NOTE — Telephone Encounter (Signed)
I called HHN back and she said pt's daughter said BP readings were 1 hour after medications.

## 2013-11-23 NOTE — Telephone Encounter (Signed)
Oh never mind i saw an old d/c summary when Dr. Gordy Levan prescribed it.

## 2013-11-23 NOTE — Telephone Encounter (Signed)
Thank you. Please also let them know to consider holding their PM lantus if night cbg is <150.

## 2013-11-23 NOTE — Telephone Encounter (Signed)
Are the sbp in 200's when she has not taken her medication?

## 2013-11-23 NOTE — Telephone Encounter (Signed)
Great thank you. If you are able to speak with [facility], please let them know to try to keep a log of AM and PM pressures for at least 1 week and to bring to opc. Also, if [name] has any headaches or blurry vision, or persistently elevated BP, she will need to come to opc sooner than scheduled or the ED

## 2013-11-23 NOTE — Telephone Encounter (Signed)
HHN informed and she will tell family

## 2013-11-23 NOTE — Telephone Encounter (Signed)
Noted high A1C in August. Meter not brought in at last visit- no blood sugars to reveiw. Could consider decreasing lantus by 2-4 units. Next appointment here is 01/02/14. If continues to have problems consider earlier appointment.

## 2013-11-23 NOTE — Telephone Encounter (Signed)
Call from pt's daughter stating pt's cbg was 42 this moring.  Pt was talking and alert but this was unusual for her AM readings. Pt given lemonade and raised cbg to 93.  Then breakfast.  She takes lantus 21 units at night and sliding scale.  I asked them to call back if her AM cbg is low again.

## 2013-11-23 NOTE — Telephone Encounter (Signed)
I dont think she has even been on this medication. Where is it coming from? And who last prescribed it?

## 2013-11-23 NOTE — Telephone Encounter (Signed)
I agree with lowering lantus if needed. Butch Penny do you mind maybe checking in on them by phone Friday or next week to make sure they are not saying too low?

## 2013-11-27 NOTE — Telephone Encounter (Signed)
[name] reports that her mother's blood sugar has not been high or low mostly in the 100s. [name] thinks that giving the fluoxetine separately from the acetaminophen has helped her wake earlier and stay on schedule and that has helped.   [name] has 2 other concerns:  She wonders why her mother is taking a longer time to respond and her mother says she  feels like something is running in her ears. [name] asks if maybe her mother needs a hearing aid? Told her I would let Dr. [name] know about this.   Her second question was about the Xarelto and her mother's  gastrointestinal bleeding. Told her I would ask our pharmacist to call her about Xarelto.

## 2013-11-27 NOTE — Telephone Encounter (Signed)
Thank you,  She has an appointment with me next month, we can address the hearing then but if she wants to be seen sooner she can always do that.   As for GIB and xarelto, i agree with pharmacist contact, however if she is having an active bleed, I would like to know and we would have to have her seen asap.   Thanks,  Dr q

## 2013-11-28 NOTE — Telephone Encounter (Signed)
Patient's daughter was asking for information/explanation about why the Xarelto was stopped and what was written on her mother's hospital discharge papers from her last admission. She did not say that patient is having any problems.

## 2013-11-29 ENCOUNTER — Telehealth: Payer: Self-pay | Admitting: Pharmacist

## 2013-11-29 ENCOUNTER — Telehealth: Payer: Self-pay | Admitting: *Deleted

## 2013-11-29 DIAGNOSIS — I5022 Chronic systolic (congestive) heart failure: Secondary | ICD-10-CM | POA: Diagnosis not present

## 2013-11-29 DIAGNOSIS — D376 Neoplasm of uncertain behavior of liver, gallbladder and bile ducts: Secondary | ICD-10-CM | POA: Diagnosis not present

## 2013-11-29 DIAGNOSIS — G40909 Epilepsy, unspecified, not intractable, without status epilepticus: Secondary | ICD-10-CM | POA: Diagnosis not present

## 2013-11-29 DIAGNOSIS — E119 Type 2 diabetes mellitus without complications: Secondary | ICD-10-CM | POA: Diagnosis not present

## 2013-11-29 DIAGNOSIS — Z9119 Patient's noncompliance with other medical treatment and regimen: Secondary | ICD-10-CM | POA: Diagnosis not present

## 2013-11-29 DIAGNOSIS — I10 Essential (primary) hypertension: Secondary | ICD-10-CM | POA: Diagnosis not present

## 2013-11-29 NOTE — Telephone Encounter (Addendum)
Call from [facility] with a list of numerous BP readings:  10/7 - 186/70, 170/91, 206/105 10/8 - 156/66, 116/75, 162/76 152/79 10/9  - 194/82, 208/96, 157/82 10/10 - 186/88,  214,100, 157/82 10/11 - 167/75   10/12 -  211/98,  202/84,  169/93,  214/98, 225/92    10/13 -  202/90 , 197/86   10/14 - 163/93     Nurse also ask for clarification of insulin instructions.  Notes are not clear.  For low Cbg < 150 at night hold lantus that night or decrease lantus 2 - 4 units if CBG is below 150 in PM. [facility] # [phone]

## 2013-11-29 NOTE — Telephone Encounter (Signed)
Per discharge summary she should STAY OFF XARELTO. She was appropriately treated for provoked PE. Please let her know.   Thanks!  Dr q

## 2013-11-30 DIAGNOSIS — I10 Essential (primary) hypertension: Secondary | ICD-10-CM | POA: Diagnosis not present

## 2013-11-30 DIAGNOSIS — D320 Benign neoplasm of cerebral meninges: Secondary | ICD-10-CM | POA: Diagnosis not present

## 2013-11-30 DIAGNOSIS — Z6834 Body mass index (BMI) 34.0-34.9, adult: Secondary | ICD-10-CM | POA: Diagnosis not present

## 2013-11-30 NOTE — Telephone Encounter (Signed)
Pt appointment 10/26, one hour slot

## 2013-11-30 NOTE — Telephone Encounter (Signed)
Talked to Margaretha Sheffield again and told her I would schedule pt for appointment She reports cbg readings : 10/ 7       148, 104, 103 10/8        67  ( This is when they called clinic)   90, 118, 187 10/9        198, 138 10/10      101, 198, 138 10/11      106, 221, 248 10/12      114, 175, 146 10/13      125,  209, 147  Nurse states pt only eats 2 meals a day with lots of Carbs. Nurse visits are once a week and can increase if Dr [name] wishes  Butch Penny,  please review and call daughter with advise on decreasing  lantus at [facility]

## 2013-11-30 NOTE — Telephone Encounter (Signed)
Her blood pressure is all over the place. If the daughters schedule allows, can we move up their visit from November to October as time permits for them?   As for cbg's: what have they been doing for the lantus so far and has she been dropping low anymore? if she is consistently running less than 150, then lets decrease the dose by 4 units--so around 17 units of lantus instead of the 21 she is listed to be on, if she is running in the 100s or lower, then no lantus at night for now. I believe donna was going to follow up with them tomorrow by phone so that can help as well.  Thanks,  Dr q

## 2013-12-01 NOTE — Telephone Encounter (Signed)
Notified patient of instructions. Thank you for clarifying!

## 2013-12-01 NOTE — Telephone Encounter (Signed)
Thank you!  Butch Penny will you be around for this appointment? That would be very helpful

## 2013-12-01 NOTE — Telephone Encounter (Signed)
spoke with [name] patient's daughter who was confused about the Lantus dose, gave her Dr, Maxine Glenn new order to give 17 units. I gave her her mother's new appointment day and time and asked her to call if she has questions about the Novolog dose. She verbalized understanding of new dose and wrote it down.

## 2013-12-01 NOTE — Telephone Encounter (Signed)
Spoke with [name] and told her new dose of Lantus is 17 units, a reasonable diet is liberal within reason. Informed her of [name] upcoming appointment. She verbalized understanding.

## 2013-12-05 DIAGNOSIS — G40909 Epilepsy, unspecified, not intractable, without status epilepticus: Secondary | ICD-10-CM | POA: Diagnosis not present

## 2013-12-05 DIAGNOSIS — Z9119 Patient's noncompliance with other medical treatment and regimen: Secondary | ICD-10-CM | POA: Diagnosis not present

## 2013-12-05 DIAGNOSIS — I10 Essential (primary) hypertension: Secondary | ICD-10-CM | POA: Diagnosis not present

## 2013-12-05 DIAGNOSIS — I5022 Chronic systolic (congestive) heart failure: Secondary | ICD-10-CM | POA: Diagnosis not present

## 2013-12-05 DIAGNOSIS — D376 Neoplasm of uncertain behavior of liver, gallbladder and bile ducts: Secondary | ICD-10-CM | POA: Diagnosis not present

## 2013-12-05 DIAGNOSIS — E119 Type 2 diabetes mellitus without complications: Secondary | ICD-10-CM | POA: Diagnosis not present

## 2013-12-11 ENCOUNTER — Encounter: Payer: Self-pay | Admitting: Internal Medicine

## 2013-12-11 ENCOUNTER — Ambulatory Visit (INDEPENDENT_AMBULATORY_CARE_PROVIDER_SITE_OTHER): Payer: Medicare Other | Admitting: Internal Medicine

## 2013-12-11 VITALS — BP 158/70 | HR 77 | Temp 97.8°F | Ht 64.0 in | Wt 208.5 lb

## 2013-12-11 DIAGNOSIS — E1165 Type 2 diabetes mellitus with hyperglycemia: Secondary | ICD-10-CM | POA: Diagnosis not present

## 2013-12-11 DIAGNOSIS — M1990 Unspecified osteoarthritis, unspecified site: Secondary | ICD-10-CM

## 2013-12-11 DIAGNOSIS — Z299 Encounter for prophylactic measures, unspecified: Secondary | ICD-10-CM

## 2013-12-11 DIAGNOSIS — Z008 Encounter for other general examination: Secondary | ICD-10-CM

## 2013-12-11 DIAGNOSIS — Z7901 Long term (current) use of anticoagulants: Secondary | ICD-10-CM | POA: Diagnosis not present

## 2013-12-11 DIAGNOSIS — D320 Benign neoplasm of cerebral meninges: Secondary | ICD-10-CM | POA: Diagnosis not present

## 2013-12-11 DIAGNOSIS — K219 Gastro-esophageal reflux disease without esophagitis: Secondary | ICD-10-CM | POA: Diagnosis not present

## 2013-12-11 DIAGNOSIS — F32A Depression, unspecified: Secondary | ICD-10-CM

## 2013-12-11 DIAGNOSIS — I10 Essential (primary) hypertension: Secondary | ICD-10-CM

## 2013-12-11 DIAGNOSIS — F329 Major depressive disorder, single episode, unspecified: Secondary | ICD-10-CM | POA: Diagnosis not present

## 2013-12-11 DIAGNOSIS — M129 Arthropathy, unspecified: Secondary | ICD-10-CM | POA: Diagnosis not present

## 2013-12-11 DIAGNOSIS — IMO0002 Reserved for concepts with insufficient information to code with codable children: Secondary | ICD-10-CM

## 2013-12-11 MED ORDER — MELOXICAM 7.5 MG PO TABS: 7.5000 mg | ORAL_TABLET | Freq: Every day | ORAL | Status: DC

## 2013-12-11 MED ORDER — FLUOXETINE HCL 10 MG PO CAPS: 10.0000 mg | ORAL_CAPSULE | Freq: Every day | ORAL | Status: DC

## 2013-12-11 MED ORDER — INSULIN ASPART 100 UNIT/ML FLEXPEN: PEN_INJECTOR | SUBCUTANEOUS | Status: DC

## 2013-12-11 MED ORDER — INSULIN GLARGINE 100 UNIT/ML SOLOSTAR PEN: 17.0000 [IU] | PEN_INJECTOR | Freq: Every day | SUBCUTANEOUS | Status: DC

## 2013-12-11 MED ORDER — LISINOPRIL 20 MG PO TABS: 20.0000 mg | ORAL_TABLET | Freq: Every day | ORAL | Status: DC

## 2013-12-11 MED ORDER — OMEPRAZOLE 20 MG PO CPDR: 20.0000 mg | DELAYED_RELEASE_CAPSULE | Freq: Every day | ORAL | Status: DC

## 2013-12-11 NOTE — Assessment & Plan Note (Signed)
Lab Results  Component Value Date   HGBA1C 11.0* 10/06/2013   HGBA1C 9.7 03/28/2013   HGBA1C 12.1 12/23/2012    Assessment: Diabetes control: poor control (HgbA1C >9%) Progress toward A1C goal:    Comments: cbg's improved from before. Briefly hypoglycemic at one time earlier this month with cbg in 60s, but now usually >100. Highest values up to 200 in evenings and best in mornings usually <150 with the exception of this morning.   Plan: Medications:  continue current medications lantus 17 units and they are using their own sliding scale at home that goes up in odd number increments (different from the one we have in our records)--i have asked for them to call us and leave what sliding scale they use so we can continue that and update our records and i will likely adjust it for PM dosing as those are her highest values Home glucose monitoring: Frequency: 3 times a day Timing: before meals Instruction/counseling given: reminded to bring blood glucose meter & log to each visit, reminded to bring medications to each visit and discussed diet Educational resources provided:   Self management tools provided:   Other plans: counseled family on diet modifications trying to avoid starchy foods or cut back when possible Recheck A1C next month, hopefully would have improved

## 2013-12-11 NOTE — Assessment & Plan Note (Signed)
Foot exam today On ACEi

## 2013-12-11 NOTE — Assessment & Plan Note (Signed)
Refilled prilosec today

## 2013-12-11 NOTE — Patient Instructions (Signed)
General Instructions:   Please bring your medicines with you each time you come to clinic.  Medicines may include prescription medications, over-the-counter medications, herbal remedies, eye drops, vitamins, or other pills.  Please call us with your novolog sliding scale  Change your lisinopril from 10mg  daily to now 20mg  daily, i have called in new tablets  Continue lantus 17 units for now and return next month as already scheduled  Follow up with neuro surgery and radiation oncology as scheduled for January.   Try mobic 7.5mg  daily for now, we can try increasing dose if no improvement Progress Toward Treatment Goals:  Treatment Goal 12/11/2013  Hemoglobin A1C -  Blood pressure deteriorated    Self Care Goals & Plans:  Self Care Goal 12/11/2013  Manage my medications take my medicines as prescribed; bring my medications to every visit; refill my medications on time  Monitor my health keep track of my blood glucose; bring my glucose meter and log to each visit  Eat healthy foods eat more vegetables; drink diet soda or water instead of juice or soda; eat foods that are low in salt; eat baked foods instead of fried foods    Home Blood Glucose Monitoring 12/11/2013  Check my blood sugar 3 times a day  When to check my blood sugar before meals     Care Management & Community Referrals:  Referral 10/13/2013  Referrals made for care management support social worker     Meloxicam tablets What is this medicine? MELOXICAM (mel OX i cam) is a non-steroidal anti-inflammatory drug (NSAID). It is used to reduce swelling and to treat pain. It may be used for osteoarthritis, rheumatoid arthritis, or juvenile rheumatoid arthritis. This medicine may be used for other purposes; ask your health care provider or pharmacist if you have questions. COMMON BRAND NAME(S): Mobic What should I tell my health care provider before I take this medicine? They need to know if you have any of these  conditions: -asthma -cigarette smoker -coronary artery bypass graft (CABG) surgery within the past 2 weeks -drink more than 3 alcohol-containing drinks a day -heart disease or circulation problems such as heart failure or leg edema (fluid retention) -hemophilia or bleeding problems -high blood pressure -kidney disease -liver disease -stomach bleeding or ulcers -an unusual or allergic reaction to meloxicam, aspirin, other NSAIDs, other medicines, foods, dyes, or preservatives -pregnant or trying to get pregnant -breast-feeding How should I use this medicine? Take this medicine by mouth with a full glass of water. Follow the directions on the prescription label. Take this medicine in an upright or sitting position. If possible take bedtime doses at least 10 minutes before lying down. You can take it with or without food. If it upsets your stomach, take it with food. Take your medicine at regular intervals. Do not take it more often than directed. A special MedGuide will be given to you by the pharmacist with each prescription and refill. Be sure to read this information carefully each time. Talk to your pediatrician regarding the use of this medicine in children. Special care may be needed. Elderly patients over 44 years old may have a stronger reaction to this medicine and need smaller doses. Overdosage: If you think you have taken too much of this medicine contact a poison control center or emergency room at once. NOTE: This medicine is only for you. Do not share this medicine with others. What if I miss a dose? If you miss a dose, take it as soon as  you can. If it is almost time for your next dose, take only that dose. Do not take double or extra doses. What may interact with this medicine? -alcohol -aspirin -cidofovir -diuretics -lithium -medicines for high blood pressure -methotrexate -other drugs for inflammation like ketorolac, ibuprofen, and  prednisone -pemetrexed -warfarin This list may not describe all possible interactions. Give your health care provider a list of all the medicines, herbs, non-prescription drugs, or dietary supplements you use. Also tell them if you smoke, drink alcohol, or use illegal drugs. Some items may interact with your medicine. What should I watch for while using this medicine? Tell your doctor or healthcare professional if your pain does not get better. Talk to your doctor before taking another medicine for pain. Do not treat yourself. This medicine does not prevent heart attack or stroke. If you take aspirin to prevent heart attack or stroke, talk with your doctor or health care professional. Do not take medicines such as ibuprofen and naproxen with this medicine. Side effects such as stomach upset, nausea, or ulcers may be more likely to occur. Many medicines available without a prescription should not be taken with this medicine. What side effects may I notice from receiving this medicine? Side effects that you should report to your doctor or health care professional as soon as possible: -black or bloody stools, blood in the urine or vomit -blurred vision -chest pain -difficulty breathing or wheezing -nausea or vomiting -skin rash, skin redness, blistering or peeling skin, hives, or itching -slurred speech or weakness on one side of the body -swelling of eyelids, throat, lips -unexplained weight gain or swelling -unusually weak or tired -yellowing of eyes or skin Side effects that usually do not require medical attention (report to your doctor or health care professional if they continue or are bothersome): -constipation or diarrhea -dizziness -gas or heartburn -stomach pain This list may not describe all possible side effects. Call your doctor for medical advice about side effects. You may report side effects to FDA at 1-800-FDA-1088. Where should I keep my medicine? Keep out of the reach of  children. Store at room temperature between 15 and 30 degrees C (59 and 86 degrees F). Protect from moisture. Keep container tightly closed. Throw away any unused medicine after the expiration date. NOTE: This sheet is a summary. It may not cover all possible information. If you have questions about this medicine, talk to your doctor, pharmacist, or health care provider.  2015, [employer]. (2009-05-27 21:15:42)

## 2013-12-11 NOTE — Assessment & Plan Note (Addendum)
Was unable to get repeat imaging last month due to GI upset so postponed until January. No more recent seizures. Reports recently seeing neurosurgery Dr. Christella Noa and follow up scheduled with him 05/31/14 at 1030am and with Dr. Tammi Klippel (rad-onc) 03/14/14 at 10am at [facility] center with MRI with and without contrast scheduled for that day as well.  -continue keppra

## 2013-12-11 NOTE — Assessment & Plan Note (Signed)
Refilled prozac today

## 2013-12-11 NOTE — Assessment & Plan Note (Signed)
BP Readings from Last 3 Encounters:  12/11/13 158/70  11/03/13 143/68  10/13/13 163/68   Lab Results  Component Value Date   NA 144 10/06/2013   K 4.0 10/06/2013   CREATININE 0.82 10/06/2013   Assessment: Blood pressure control: moderately elevated Progress toward BP goal:  deteriorated Comments: remain uncontrolled, has room to go up on her ACEi which is what we will do  Plan: Medications:  continue current medications lisinopril increased to 20mg  today, coreg 25mg  bid, lasix 20mg  Educational resources provided:   Self management tools provided:   Other plans: recheck next visit and adjust doses if needed; also asked to continue to check bp at home when they can and keep a log

## 2013-12-11 NOTE — Assessment & Plan Note (Signed)
Diffuse body, mainly in legs and arms. Has been on percocet in past that was well controlled but not on it anymore. Given her age and multiple co-morbidities, I would like to avoid strong opiates at this time.  -no relief with high dose tylenol -will stop tylenol and start trial of low dose mobic 7.5mg  daily until follow up and can titrate dose up. If this makes no change, my next medication of choice would be tramadol low dose

## 2013-12-11 NOTE — Assessment & Plan Note (Signed)
Off Xarelto now

## 2013-12-11 NOTE — Progress Notes (Signed)
Subjective:   Patient ID: [name] female   DOB: 1934/02/03 80 y.o.   MRN: [id number]  HPI: DorisNakeesha Viona Gilmore Burns is a 80 y.o. with PMH notable for meningioma s/p craniotomy and tumor excision, HTN, and DM2 presenting to opc today for routine follow up visit.   DM2--had some hypoglycemia, as low as 67 earlier this month that was unusual for her. She was taking Lantus 21 units at that time at night with novolog sliding scale. Since then, CBG's noted to be in 100s but highest in afternoon up to 220s some days. No more low's and highest values are in the evenings up to 200s. Best values are in the mornings with exception of today that was 224. CBG today was 193. Lantus was decreased to 17 units 12/01/13 over the phone with CDE assistance. Novolog sliding scale do no recall what it is but goes up on odd values (different from the one we have in our records)  Last A1C 11 10/06/2013.  HTN--BP has been variable recently per phone notes, up to 220 SBP. Current regimen lasix 20mg , coreg 25mg  bid, and lisinopril 10mg  daily. BP 158/70.  Meningioma--was apparently supposed to have repeat MRI brain done last month, but had GI upset that day and could not tolerate the procedure.  MRI rescheduled for January 2016.  Last MRI brain 08/2013 IMPRESSION:  Continued improvement in postop changes after resection of right  frontal meningioma. Postop type enhancement in the surgical bed  shows interval improvement. Postop dural enhancement again noted. No  definite residual or recurrent tumor. Continue follow-up recommended. 6 mm right tentorial meningioma is stable.  Diffuse body pain--not controlled with tylenol. Has been on percocet in the past per daughter who says that worked well but has not been on it for a while. Patient says her legs and arms hurt often.   Her daughter is primary care taker and has been doing a GREAT job, especially now that her own daughter is also here to help. Together, they help take  care of [name]. Nori Burns. They do have a home health nurse that comes by for which they are very appreciative, however, do note that she gets a little aggressive sometimes with her diet recommendations and telling them to write down everything which they cannot always do.  Past Medical History  Diagnosis Date  . Diabetes mellitus 2007    HgA1C (02/20/2010) = 9.2, HgA1C (03/20/2009) = 12.1  . CVA (cerebrovascular accident) 2458     right embolic stroke, no residual deficits  . Diverticulitis   . CVA (cerebral infarction) 7-yrs ago  . Arthritis   . Dysphagia   . CHF (congestive heart failure)   . DIABETES MELLITUS, TYPE II 12/04/2005  . HYPERLIPIDEMIA 12/04/2005  . HYPERTENSION 12/04/2005  . GERD 12/04/2005  . ARTHRITIS, KNEE 03/24/2006  . Lung mass 07/08/2010  . Meningioma 07/21/2010  . Hemangioma of liver 12/02/2010  . Depression 12/02/2010  . Adenocarcinoma of lung      Right upper lobe adenocarcinoma. s/p right lower lobectomy 12/15/10  . 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 acetaminophen (TYLENOL) 500 MG tablet Take 2 tablets (1,000 mg total) by mouth every 8 (eight) hours as needed for mild pain.  30 tablet  2  . albuterol (PROVENTIL HFA;VENTOLIN HFA) 108 (90 BASE) MCG/ACT inhaler Inhale 2 puffs into the lungs every 4 (four) hours as needed  for wheezing.  3 Inhaler  1  . albuterol (PROVENTIL) (2.5 MG/3ML) 0.083% nebulizer solution Take 3 mLs (2.5 mg total) by nebulization every 6 (six) hours as needed for wheezing.  75 mL  12  . aspirin 81 MG tablet Take 81 mg by mouth daily.       . carvedilol (COREG) 25 MG tablet Take 1 tablet (25 mg total) by mouth 2 (two) times daily with a meal.  180 tablet  4  . FERREX 150 150 MG capsule TAKE 1 CAPSULE (150 MG TOTAL) BY MOUTH DAILY.  30 capsule  1  . FLUoxetine (PROZAC) 10 MG capsule Take 1 capsule (10 mg total) by mouth at bedtime.  30 capsule  3  .  furosemide (LASIX) 20 MG tablet Take 1 tablet (20 mg total) by mouth daily.  90 tablet  3  . glucose blood (ACCU-CHEK AVIVA) test strip Use to Check Blood Sugars 3 Times Daily.Dx Code: 250.00. Insulin dependent  100 each  12  . insulin aspart (NOVOLOG FLEXPEN) 100 UNIT/ML FlexPen Take three times with meals  <70    2 units if going to eat- skip if going to skip meal   71-140    4 units Novolog   141-210  6 units Novolog   211-280  8 units  15 mL  11  . Insulin Glargine (LANTUS SOLOSTAR) 100 UNIT/ML Solostar Pen Inject 21 Units into the skin at bedtime.  10 mL  5  . levETIRAcetam (KEPPRA) 750 MG tablet Take 1 tablet (750 mg total) by mouth 2 (two) times daily.  60 tablet  3  . lisinopril (PRINIVIL,ZESTRIL) 5 MG tablet Take 2 tablets (10 mg total) by mouth daily.  60 tablet  5  . omeprazole (PRILOSEC) 20 MG capsule Take 1 capsule (20 mg total) by mouth daily.  30 capsule  3  . senna-docusate (SENOKOT-S) 8.6-50 MG per tablet Take 1 tablet by mouth 2 (two) times daily.  30 tablet  2  . simvastatin (ZOCOR) 20 MG tablet Take 20 mg by mouth daily.       No current facility-administered medications for this visit.   Family History  Problem Relation Age of Onset  . Hyperlipidemia Brother   . Hypertension Brother   . Diabetes Brother    History   Social History  . Marital Status: Widowed    Spouse Name: N/A    Number of Children: N/A  . Years of Education: 10   Occupational History  .  Unemployed   Social History Main Topics  . Smoking status: Former Smoker    Types: Cigarettes    Quit date: 02/17/2000  . Smokeless tobacco: Never Used  . Alcohol Use: No  . Drug Use: No  . Sexual Activity: Not Currently   Other Topics Concern  . Not on file   Social History Narrative   Patient requests to use [facility] for her diabets testing supplies as of 09/05/2009.   Review of Systems:  Constitutional:  Denies fever, chills  HEENT:  Denies congestion  Respiratory:  Denies SOB    Cardiovascular:  Denies chest pain  Gastrointestinal:  Denies nausea, vomiting, abdominal pain   Genitourinary:  Denies dysuria  Musculoskeletal:  Diffuse body pain, more in shoulders sometimes  Skin:  Denies pallor, rash and wound.   Neurological:  Denies headaches or recent seizure   Objective:  Physical Exam: Filed Vitals:   12/11/13 1344  BP: 158/70  Pulse: 77  Temp: 97.8 F (36.6  C)  TempSrc: Oral  Height: 5\' 4"  (1.626 m)  Weight: 208 lb 8 oz (94.575 kg)  SpO2: 100%   Vitals reviewed. General: sitting in wheelchair, NAD HEENT: EOMI Cardiac: RRR Pulm: clear to auscultation bilaterally, no wheezes, rales, or rhonchi Abd: soft, obese, nontender, nondistended, BS present Ext: warm and well perfused, trace lower extremity edema, b/l tenderness to palpation, DPs obtained by doppler--strong b/l Neuro: alert and oriented X3, strength and sensation to light touch equal in bilateral upper and lower extremities  Assessment & Plan:  Discussed with Dr. Eppie Gibson Awaiting call from daughter to confirm novolog sliding scale they are using at home Trial of mobic Will consider dexa if patient and family willing to have another scan done Increased lisinopril to 20mg  RTC to see me next month already scheduled

## 2013-12-12 ENCOUNTER — Encounter: Payer: Self-pay | Admitting: Internal Medicine

## 2013-12-12 NOTE — Progress Notes (Signed)
Case discussed with Dr. [name] soon after the resident saw the patient. We reviewed the resident's history and exam and pertinent patient test results. I agree with the assessment, diagnosis, and plan of care documented in the resident's note.

## 2013-12-13 DIAGNOSIS — D376 Neoplasm of uncertain behavior of liver, gallbladder and bile ducts: Secondary | ICD-10-CM | POA: Diagnosis not present

## 2013-12-13 DIAGNOSIS — E119 Type 2 diabetes mellitus without complications: Secondary | ICD-10-CM | POA: Diagnosis not present

## 2013-12-13 DIAGNOSIS — I5022 Chronic systolic (congestive) heart failure: Secondary | ICD-10-CM | POA: Diagnosis not present

## 2013-12-13 DIAGNOSIS — Z9119 Patient's noncompliance with other medical treatment and regimen: Secondary | ICD-10-CM | POA: Diagnosis not present

## 2013-12-13 DIAGNOSIS — I10 Essential (primary) hypertension: Secondary | ICD-10-CM | POA: Diagnosis not present

## 2013-12-13 DIAGNOSIS — G40909 Epilepsy, unspecified, not intractable, without status epilepticus: Secondary | ICD-10-CM | POA: Diagnosis not present

## 2013-12-13 LAB — GLUCOSE, CAPILLARY: GLUCOSE-CAPILLARY: 168 mg/dL — AB (ref 70–99)

## 2013-12-20 DIAGNOSIS — D376 Neoplasm of uncertain behavior of liver, gallbladder and bile ducts: Secondary | ICD-10-CM | POA: Diagnosis not present

## 2013-12-20 DIAGNOSIS — I10 Essential (primary) hypertension: Secondary | ICD-10-CM | POA: Diagnosis not present

## 2013-12-20 DIAGNOSIS — I5022 Chronic systolic (congestive) heart failure: Secondary | ICD-10-CM | POA: Diagnosis not present

## 2013-12-20 DIAGNOSIS — G40909 Epilepsy, unspecified, not intractable, without status epilepticus: Secondary | ICD-10-CM | POA: Diagnosis not present

## 2013-12-20 DIAGNOSIS — Z9119 Patient's noncompliance with other medical treatment and regimen: Secondary | ICD-10-CM | POA: Diagnosis not present

## 2013-12-20 DIAGNOSIS — E119 Type 2 diabetes mellitus without complications: Secondary | ICD-10-CM | POA: Diagnosis not present

## 2013-12-21 ENCOUNTER — Telehealth: Payer: Self-pay | Admitting: *Deleted

## 2013-12-21 DIAGNOSIS — Z794 Long term (current) use of insulin: Secondary | ICD-10-CM | POA: Diagnosis not present

## 2013-12-21 DIAGNOSIS — I10 Essential (primary) hypertension: Secondary | ICD-10-CM | POA: Diagnosis not present

## 2013-12-21 DIAGNOSIS — Z85118 Personal history of other malignant neoplasm of bronchus and lung: Secondary | ICD-10-CM | POA: Diagnosis not present

## 2013-12-21 DIAGNOSIS — D376 Neoplasm of uncertain behavior of liver, gallbladder and bile ducts: Secondary | ICD-10-CM | POA: Diagnosis not present

## 2013-12-21 DIAGNOSIS — I5022 Chronic systolic (congestive) heart failure: Secondary | ICD-10-CM | POA: Diagnosis not present

## 2013-12-21 DIAGNOSIS — E119 Type 2 diabetes mellitus without complications: Secondary | ICD-10-CM | POA: Diagnosis not present

## 2013-12-21 DIAGNOSIS — Z86711 Personal history of pulmonary embolism: Secondary | ICD-10-CM | POA: Diagnosis not present

## 2013-12-21 DIAGNOSIS — G40909 Epilepsy, unspecified, not intractable, without status epilepticus: Secondary | ICD-10-CM | POA: Diagnosis not present

## 2013-12-21 DIAGNOSIS — Z7901 Long term (current) use of anticoagulants: Secondary | ICD-10-CM | POA: Diagnosis not present

## 2013-12-21 DIAGNOSIS — Z9119 Patient's noncompliance with other medical treatment and regimen: Secondary | ICD-10-CM | POA: Diagnosis not present

## 2013-12-21 NOTE — Telephone Encounter (Signed)
11/4 late afternoon: 174/86 194/89, 205/90, 199/88, 194/88, 149/74, 211/86, 192/70, 151/67, 210/92, 169/83 Cbg: 96, 169, 112, 140, 206, 191,228, 157,91,202,129,194,99 Dry cough using otc robitussin Pain continues in knees

## 2013-12-22 NOTE — Telephone Encounter (Signed)
Is this just all for one day the BP values?  CBGs look better  She has an appt with me next week that hopefully she will keep

## 2013-12-25 ENCOUNTER — Other Ambulatory Visit: Payer: Self-pay | Admitting: Internal Medicine

## 2013-12-26 ENCOUNTER — Encounter: Payer: Self-pay | Admitting: Internal Medicine

## 2013-12-26 ENCOUNTER — Ambulatory Visit (INDEPENDENT_AMBULATORY_CARE_PROVIDER_SITE_OTHER): Payer: Medicare Other | Admitting: Internal Medicine

## 2013-12-26 VITALS — BP 171/68 | HR 72 | Temp 98.2°F | Wt 209.2 lb

## 2013-12-26 DIAGNOSIS — M129 Arthropathy, unspecified: Secondary | ICD-10-CM

## 2013-12-26 DIAGNOSIS — E1165 Type 2 diabetes mellitus with hyperglycemia: Secondary | ICD-10-CM

## 2013-12-26 DIAGNOSIS — IMO0002 Reserved for concepts with insufficient information to code with codable children: Secondary | ICD-10-CM

## 2013-12-26 DIAGNOSIS — I10 Essential (primary) hypertension: Secondary | ICD-10-CM | POA: Diagnosis not present

## 2013-12-26 DIAGNOSIS — M1990 Unspecified osteoarthritis, unspecified site: Secondary | ICD-10-CM

## 2013-12-26 LAB — BASIC METABOLIC PANEL WITH GFR
BUN: 19 mg/dL (ref 6–23)
CO2: 26 mEq/L (ref 19–32)
Calcium: 8.6 mg/dL (ref 8.4–10.5)
Chloride: 109 mEq/L (ref 96–112)
Creat: 0.89 mg/dL (ref 0.50–1.10)
GFR, Est African American: 72 mL/min
GFR, Est Non African American: 62 mL/min
Glucose, Bld: 119 mg/dL — ABNORMAL HIGH (ref 70–99)
Potassium: 3.9 mEq/L (ref 3.5–5.3)
Sodium: 143 mEq/L (ref 135–145)

## 2013-12-26 LAB — GLUCOSE, CAPILLARY: Glucose-Capillary: 129 mg/dL — ABNORMAL HIGH (ref 70–99)

## 2013-12-26 LAB — POCT GLYCOSYLATED HEMOGLOBIN (HGB A1C): HEMOGLOBIN A1C: 7.6

## 2013-12-26 MED ORDER — LISINOPRIL 40 MG PO TABS: 40.0000 mg | ORAL_TABLET | Freq: Every day | ORAL | Status: DC

## 2013-12-26 MED ORDER — MELOXICAM 7.5 MG PO TABS: 15.0000 mg | ORAL_TABLET | Freq: Every day | ORAL | Status: DC

## 2013-12-26 NOTE — Assessment & Plan Note (Signed)
BP Readings from Last 3 Encounters:  12/26/13 171/61  12/11/13 158/70  11/03/13 143/68   Lab Results  Component Value Date   NA 144 10/06/2013   K 4.0 10/06/2013   CREATININE 0.82 10/06/2013   Assessment: Blood pressure control: moderately elevated Progress toward BP goal:  deteriorated Comments: compliant with medications  Plan: Medications:  increase lisinopril to 40mg  and continue coreg 25mg  bid and lasix 20mg  qd Educational resources provided:   Self management tools provided:   Other plans: check bmet today, follow up 2 weeks if possible continue to check bp at home

## 2013-12-26 NOTE — Assessment & Plan Note (Addendum)
Lab Results  Component Value Date   HGBA1C 7.6 12/26/2013   HGBA1C 11.0* 10/06/2013   HGBA1C 9.7 03/28/2013    Assessment: Diabetes control: fair control Progress toward A1C goal:  improved Comments: much improved! Down to 7.6 today, average 154, highest values still in PM. Daughter needs to call in the sliding scale they are using at home  Plan: Medications:  continue current medications lantus 17 units and novolog sliding scale Home glucose monitoring: Frequency:   Timing: before meals Instruction/counseling given: reminded to bring blood glucose meter & log to each visit Educational resources provided:   Self management tools provided:   Other plans: congratulated patient and family on blood sugars. Encouraged to keep it up. Given the great improvement in cbg's, we will leave her current regimen as it is since the daughter has a great understanding and control of her sugars as does patient. She will continue to work on diet and carb intake. If however, cbg's start rising, then may consider increasing evening sliding scale in evenings given cbg in 200s at times during that time but otherwise well controlled

## 2013-12-26 NOTE — Patient Instructions (Signed)
General Instructions:  Please bring your medicines with you each time you come to clinic.  Medicines may include prescription medications, over-the-counter medications, herbal remedies, eye drops, vitamins, or other pills.  Great job on your diabetes! Your a1c is much better today down to 7.6. Please keep up the good work and call me with your home sliding scale.   Your blood pressure is still high, lets increase your lisinopril to 40mg  daily and continue coreg 25mg  bid and lasix 20mg , Return for check 2 weeks and check blood pressure at home. If it remains high, let us know. If you have sudden weakness, numbness, headaches, change in vision, fainting, nausea, vomiting, chest pain, or sob call us right away and go directly to the emergency room if severe.   We increased your pain medication to mobic 15mg  daily  Progress Toward Treatment Goals:  Treatment Goal 12/26/2013  Hemoglobin A1C improved  Blood pressure deteriorated    Self Care Goals & Plans:  Self Care Goal 12/11/2013  Manage my medications take my medicines as prescribed; bring my medications to every visit; refill my medications on time  Monitor my health keep track of my blood glucose; bring my glucose meter and log to each visit  Eat healthy foods eat more vegetables; drink diet soda or water instead of juice or soda; eat foods that are low in salt; eat baked foods instead of fried foods    Home Blood Glucose Monitoring 12/26/2013  Check my blood sugar -  When to check my blood sugar before meals     Care Management & Community Referrals:  Referral 10/13/2013  Referrals made for care management support social worker

## 2013-12-26 NOTE — Assessment & Plan Note (Signed)
Not much relief with mobic 7.5mg , will increase to 15mg  daily and will ask PT to return to work with her to help with weakness and likely deconditioning which she is agreeable to.   If no relief with higher dose of mobic, then may need tramadol along with PT

## 2013-12-26 NOTE — Progress Notes (Signed)
Subjective:   Patient ID: AHMYA BERNICK female   DOB: November 01, 1933 80 y.o.   MRN: [id number]  HPI: Ms.Idaly Viona Gilmore Dodds is a 80 y.o. female with DM2 and HTN and other PMH as listed below presenting to opc today for follow up visit of DM2 and HTN.   DM2--repeat A1C today. Last A1C 11 09/2013. Currently on lantus 17 units and novolog sliding scale. They were supposed to call and let us know what scale they are using for novolog but were not able to do so.   HTN--last visit lisinopril was increased to 20mg  daily and continued on coreg and lasix. BP remains elevated today. BP remains elevated today.    Past Medical History  Diagnosis Date  . Diabetes mellitus 2007    HgA1C (02/20/2010) = 9.2, HgA1C (03/20/2009) = 12.1  . CVA (cerebrovascular accident) 4098     right embolic stroke, no residual deficits  . Diverticulitis   . CVA (cerebral infarction) 7-yrs ago  . Arthritis   . Dysphagia   . CHF (congestive heart failure)   . DIABETES MELLITUS, TYPE II 12/04/2005  . HYPERLIPIDEMIA 12/04/2005  . HYPERTENSION 12/04/2005  . GERD 12/04/2005  . ARTHRITIS, KNEE 03/24/2006  . Lung mass 07/08/2010  . Meningioma 07/21/2010  . Hemangioma of liver 12/02/2010  . Depression 12/02/2010  . Adenocarcinoma of lung      Right upper lobe adenocarcinoma. s/p right lower lobectomy 12/15/10  . 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 albuterol (PROVENTIL HFA;VENTOLIN HFA) 108 (90 BASE) MCG/ACT inhaler Inhale 2 puffs into the lungs every 4 (four) hours as needed for wheezing. 3 Inhaler 1  . albuterol (PROVENTIL) (2.5 MG/3ML) 0.083% nebulizer solution Take 3 mLs (2.5 mg total) by nebulization every 6 (six) hours as needed for wheezing. 75 mL 12  . aspirin 81 MG tablet Take 81 mg by mouth daily.     . carvedilol (COREG) 25 MG tablet Take 1 tablet (25 mg total) by mouth 2 (two) times daily with a meal. 180 tablet 4  .  FERREX 150 150 MG capsule TAKE 1 CAPSULE (150 MG TOTAL) BY MOUTH DAILY. 30 capsule 1  . FLUoxetine (PROZAC) 10 MG capsule Take 1 capsule (10 mg total) by mouth at bedtime. 30 capsule 3  . furosemide (LASIX) 20 MG tablet Take 1 tablet (20 mg total) by mouth daily. 90 tablet 3  . glucose blood (ACCU-CHEK AVIVA) test strip Use to Check Blood Sugars 3 Times Daily.Dx Code: 250.00. Insulin dependent 100 each 12  . insulin aspart (NOVOLOG FLEXPEN) 100 UNIT/ML FlexPen <70 2 units if going to eat- skip if going to skip meal, 71-140 4 units; 141-210 6 units; 211-280 8 units 15 mL 11  . Insulin Glargine (LANTUS SOLOSTAR) 100 UNIT/ML Solostar Pen Inject 17 Units into the skin at bedtime. 10 mL 5  . levETIRAcetam (KEPPRA) 750 MG tablet Take 1 tablet (750 mg total) by mouth 2 (two) times daily. 60 tablet 3  . lisinopril (PRINIVIL,ZESTRIL) 20 MG tablet Take 1 tablet (20 mg total) by mouth daily. 30 tablet 1  . meloxicam (MOBIC) 7.5 MG tablet Take 1 tablet (7.5 mg total) by mouth daily. 21 tablet 0  . omeprazole (PRILOSEC) 20 MG capsule Take 1 capsule (20 mg total) by mouth daily. 30 capsule 3  . senna-docusate (SENOKOT-S) 8.6-50 MG per tablet Take 1 tablet by mouth 2 (two) times  daily. 30 tablet 2  . simvastatin (ZOCOR) 20 MG tablet Take 20 mg by mouth daily.     No current facility-administered medications for this visit.   Family History  Problem Relation Age of Onset  . Hyperlipidemia Brother   . Hypertension Brother   . Diabetes Brother    History   Social History  . Marital Status: Widowed    Spouse Name: N/A    Number of Children: N/A  . Years of Education: 10   Occupational History  .  Unemployed   Social History Main Topics  . Smoking status: Former Smoker    Types: Cigarettes    Quit date: 02/17/2000  . Smokeless tobacco: Never Used  . Alcohol Use: No  . Drug Use: No  . Sexual Activity: Not Currently   Other Topics Concern  . Not on file   Social History Narrative   Patient  requests to use [employer] for her diabets testing supplies as of 09/05/2009.   Review of Systems:  Constitutional:  Denies fever, chills  HEENT:  Denies congestion  Respiratory:  Denies SOB  Cardiovascular:  Denies chest pain   Gastrointestinal:  Nausea. Denies vomiting or abdominal pain  Genitourinary:  Denies dysuria  Musculoskeletal:  Lower extremity weakness and joint pain diffuse  Skin:  Denies pallor, rash and wound.   Neurological:  Denies headaches or seizure   Objective:  Physical Exam: Filed Vitals:   12/26/13 1600  BP: 171/61  Pulse: 72  Temp: 98.2 F (36.8 C)  TempSrc: Oral  Weight: 209 lb 3.2 oz (94.892 kg)  SpO2: 100%   Vitals reviewed. General: sitting in wheelchair, NAD HEENT: EOMI Cardiac: RRR Pulm: clear to auscultation bilaterally Abd: soft, nontender, nondistended, BS present Ext: warm and well perfused, no pedal edema, faint distal pulses, moving all extremities, able to stand up on her own Neuro: alert and oriented X3, able to walk with support, romberg negative fairly steady with eyes closed but did open eyes shortly, shrugging shoulders, no slurred speech or tongue deviation, closes eyes tightly, strength and sensation to light touch equal in bilateral upper and lower extremities  Assessment & Plan:  Discussed with Dr. Dareen Piano Increased lisinopril to 40mg  bmet Increased mobic to 15mg

## 2013-12-27 ENCOUNTER — Telehealth: Payer: Self-pay | Admitting: *Deleted

## 2013-12-27 DIAGNOSIS — Z9119 Patient's noncompliance with other medical treatment and regimen: Secondary | ICD-10-CM | POA: Diagnosis not present

## 2013-12-27 DIAGNOSIS — I5022 Chronic systolic (congestive) heart failure: Secondary | ICD-10-CM | POA: Diagnosis not present

## 2013-12-27 DIAGNOSIS — G40909 Epilepsy, unspecified, not intractable, without status epilepticus: Secondary | ICD-10-CM | POA: Diagnosis not present

## 2013-12-27 DIAGNOSIS — D376 Neoplasm of uncertain behavior of liver, gallbladder and bile ducts: Secondary | ICD-10-CM | POA: Diagnosis not present

## 2013-12-27 DIAGNOSIS — E119 Type 2 diabetes mellitus without complications: Secondary | ICD-10-CM | POA: Diagnosis not present

## 2013-12-27 DIAGNOSIS — I10 Essential (primary) hypertension: Secondary | ICD-10-CM | POA: Diagnosis not present

## 2013-12-27 NOTE — Telephone Encounter (Signed)
Has no transportation per daughter Ms. Maisie Fus - will try to get to clinic 12/28/13 or 12/29/13.

## 2013-12-27 NOTE — Telephone Encounter (Signed)
[name] from [facility] called [phone]. CBG today 99 and BP 188/88 - just got up -  no med  or meals today. [name] [name] Lippman RN 12/27/13 3:30PM

## 2013-12-27 NOTE — Progress Notes (Signed)
INTERNAL MEDICINE TEACHING ATTENDING ADDENDUM - Katrin Grabel, MD: I reviewed and discussed at the time of visit with the resident Dr. Qureshi, the patient's medical history, physical examination, diagnosis and results of pertinent tests and treatment and I agree with the patient's care as documented.  

## 2013-12-27 NOTE — Telephone Encounter (Signed)
Pressures for 1 week

## 2013-12-27 NOTE — Telephone Encounter (Signed)
Can someone please call and give [place] the novolog sliding scale they use? The daughter was supposed to do this. BP medication was just adjusted, will continue to monitor  Thanks!  Dr q

## 2014-01-02 ENCOUNTER — Encounter: Payer: Medicare Other | Admitting: Internal Medicine

## 2014-01-09 ENCOUNTER — Telehealth: Payer: Self-pay | Admitting: *Deleted

## 2014-01-09 DIAGNOSIS — I5022 Chronic systolic (congestive) heart failure: Secondary | ICD-10-CM | POA: Diagnosis not present

## 2014-01-09 DIAGNOSIS — Z9119 Patient's noncompliance with other medical treatment and regimen: Secondary | ICD-10-CM | POA: Diagnosis not present

## 2014-01-09 DIAGNOSIS — E119 Type 2 diabetes mellitus without complications: Secondary | ICD-10-CM | POA: Diagnosis not present

## 2014-01-09 DIAGNOSIS — I10 Essential (primary) hypertension: Secondary | ICD-10-CM | POA: Diagnosis not present

## 2014-01-09 DIAGNOSIS — G40909 Epilepsy, unspecified, not intractable, without status epilepticus: Secondary | ICD-10-CM | POA: Diagnosis not present

## 2014-01-09 DIAGNOSIS — D376 Neoplasm of uncertain behavior of liver, gallbladder and bile ducts: Secondary | ICD-10-CM | POA: Diagnosis not present

## 2014-01-09 NOTE — Telephone Encounter (Signed)
Since I last signed her out to you Dr. Dareen Piano,  Here are her home BP values still all over the place although today's resting look decent.   She missed her recent follow up with me, but is on max lisinopril now and also on coreg. Also on lasix. How do you feel about continued ACE and BB, and stopping lasix, and starting HCTZ to see if that can help the pressure.   Also i think she gave mobic a fair try, and I was gonna try tramadol perhaps?   Thoughts?  [name]

## 2014-01-09 NOTE — Telephone Encounter (Signed)
Call from [place] with [facility] - [phone]  Nurse reports BP's on : 11/23  194/82,           11/22 : 179/78   149/58,  191/81,  126 /101 11/21:   209/86, 105/93,  181/87 11/20:    202/94   200/126   CBG - 87,  107 11/19:    169/66  193/81     CBG - 104,183, 142 Today resting BP 152/76   Pt request another option for pain meds for knee.  The Meloxicam has no effect, rates pain 8/10 and increase pain with ambulation

## 2014-01-10 ENCOUNTER — Other Ambulatory Visit: Payer: Self-pay | Admitting: Internal Medicine

## 2014-01-10 ENCOUNTER — Telehealth: Payer: Self-pay | Admitting: Internal Medicine

## 2014-01-10 MED ORDER — TRAMADOL HCL 50 MG PO TABS: 25.0000 mg | ORAL_TABLET | Freq: Two times a day (BID) | ORAL | Status: DC | PRN

## 2014-01-10 MED ORDER — ACETAMINOPHEN-CODEINE #3 300-30 MG PO TABS: 1.0000 | ORAL_TABLET | Freq: Two times a day (BID) | ORAL | Status: DC | PRN

## 2014-01-10 NOTE — Progress Notes (Signed)
I have tried calling all of Ms. Dorko's phone numbers in the chart multiple times this afternoon. I have tried both home number not in service and Seondra's cell phone that says is also not in service to touch base with them. I then left a voicemail for Ms. Hendrix to call us back if she gets the message. I also talked to [facility] from [facility] who will not see the patient until next week.   I am okay with changing the pain medication to tramadol low dose 25mg  bid prn for now and she needs to come see me as scheduled on 12/1. I would also like to change her blood pressure medicine from the lasix to hctz 25mg  and continue lisinopril and coreg but I am not able to discuss the medication change with them on the phone today unfortunately.   I will leave a signed prescription with triage in the event that they can call before we close today and come pick it up. Triage can also notify them of my plan for the medication change and I can then safely place those orders for BP medication change.

## 2014-01-10 NOTE — Telephone Encounter (Signed)
Rx called in to pharmacy. Talked with Margaretha Sheffield at [facility]- unable to reach pt or daughter [phone] or [phone] - both numbers are not in service.

## 2014-01-10 NOTE — Telephone Encounter (Signed)
She is scheduled to see me 12/1.

## 2014-01-10 NOTE — Telephone Encounter (Signed)
I think that tramadol is a good option at this point. Thank you Samaya.  I am not sure why her BP is all over the place. I think that starting HCTZ is a good idea but I am not sure why her BP is fluctuating so much. Some of it might be related to pain. Can we ask her to follow up in 1-2 weeks and reassess her. Thanks

## 2014-01-10 NOTE — Telephone Encounter (Signed)
I tried calling [name] again multiple times this afternoon with no answer on any of the phones. The home and cell of [name] are disconnected.   I was initially thinking of prescribing tramadol for pain but given that can decrease the seizure threshold (has hx of seizures), will try tylenol #3 instead. This will need to be picked up unless it can be called in.   Additionally, we plan to make changes to BP medications from lasix to hctz 25mg  but again this needs to be discussed with patient to be safely started.   I have discussed with triage this plan in case someone is able to call back and also talked to [place] from [facility] who says the cell number for [name] was working yesterday but she will not see [name] until next week.   She will hopefully keep her appt with me 12/1 where we can make the changes if she is unable to get her medicine in time from the clinic today.

## 2014-01-16 ENCOUNTER — Encounter: Payer: Medicare Other | Admitting: Internal Medicine

## 2014-01-17 ENCOUNTER — Telehealth: Payer: Self-pay | Admitting: *Deleted

## 2014-01-17 DIAGNOSIS — G40909 Epilepsy, unspecified, not intractable, without status epilepticus: Secondary | ICD-10-CM | POA: Diagnosis not present

## 2014-01-17 DIAGNOSIS — Z9119 Patient's noncompliance with other medical treatment and regimen: Secondary | ICD-10-CM | POA: Diagnosis not present

## 2014-01-17 DIAGNOSIS — E119 Type 2 diabetes mellitus without complications: Secondary | ICD-10-CM | POA: Diagnosis not present

## 2014-01-17 DIAGNOSIS — D376 Neoplasm of uncertain behavior of liver, gallbladder and bile ducts: Secondary | ICD-10-CM | POA: Diagnosis not present

## 2014-01-17 DIAGNOSIS — I10 Essential (primary) hypertension: Secondary | ICD-10-CM | POA: Diagnosis not present

## 2014-01-17 DIAGNOSIS — I5022 Chronic systolic (congestive) heart failure: Secondary | ICD-10-CM | POA: Diagnosis not present

## 2014-01-17 NOTE — Telephone Encounter (Signed)
Call from [place], [place] with [facility] - # [phone]  Nurse called to report daughter's new # is [phone] Tylenol #3 is helping patients  pain. BP's  : Today 164/74 at visit 12/1:    180/85, 129/75   11/30:    170/63,  167/71, 150/75 11/29: 180/76  173/88  140/86 11/28:  203/83 168/71 11/27: 193/91 150/71   cbg today 104 12/1:   158 &  202 11/30:  104 &  141 11/29:  83 & 91 11/28:  147&   156 11/27:   96 & 138 11/26:   191,  156,  92

## 2014-01-18 ENCOUNTER — Telehealth: Payer: Self-pay | Admitting: Internal Medicine

## 2014-01-18 NOTE — Telephone Encounter (Signed)
I called [name]'s home today to discuss bp values called in by [facility] RN but home number is still disconnected. I then tried her daughter, [name] new number and left a voicemail to call the clinic back.   They unfortunately missed their last appointment with me and next appointment is scheduled for 12/15 with me. I would to try to move this appointment up to next week if they can make it. If not, given the persistently high blood pressure but highly variable values, I would like to start low dose norvasc 5mg  daily to see if that will help control the blood pressure.   This will need to be discussed with patient and/or daughter before hand and then i can place the order if they are in agreement and we can confirm doses of medications are not being missed.   I will forward this note to RN and triage in case they call back to relay the message.

## 2014-01-18 NOTE — Telephone Encounter (Signed)
Addendum:  Instead of norvasc, will start hctz 25mg  daily and stop lasix 20mg  if family in agreement and compliant with lisinopril.

## 2014-01-23 ENCOUNTER — Other Ambulatory Visit: Payer: Self-pay | Admitting: Internal Medicine

## 2014-01-23 ENCOUNTER — Encounter: Payer: Medicare Other | Admitting: Internal Medicine

## 2014-01-23 ENCOUNTER — Encounter: Payer: Self-pay | Admitting: Internal Medicine

## 2014-01-23 ENCOUNTER — Telehealth: Payer: Self-pay | Admitting: *Deleted

## 2014-01-23 DIAGNOSIS — I5022 Chronic systolic (congestive) heart failure: Secondary | ICD-10-CM | POA: Diagnosis not present

## 2014-01-23 DIAGNOSIS — Z9119 Patient's noncompliance with other medical treatment and regimen: Secondary | ICD-10-CM | POA: Diagnosis not present

## 2014-01-23 DIAGNOSIS — I10 Essential (primary) hypertension: Secondary | ICD-10-CM | POA: Diagnosis not present

## 2014-01-23 DIAGNOSIS — E119 Type 2 diabetes mellitus without complications: Secondary | ICD-10-CM | POA: Diagnosis not present

## 2014-01-23 DIAGNOSIS — G40909 Epilepsy, unspecified, not intractable, without status epilepticus: Secondary | ICD-10-CM | POA: Diagnosis not present

## 2014-01-23 DIAGNOSIS — D376 Neoplasm of uncertain behavior of liver, gallbladder and bile ducts: Secondary | ICD-10-CM | POA: Diagnosis not present

## 2014-01-23 NOTE — Telephone Encounter (Signed)
I received the message from [name] and updated her sliding scale for our records of the novolog. I also tried calling [name] back to talk abut BP but no answer and I tried calling [name]'s cell phone as well and left a voice mail on both phones.   BP remains elevated per today's value, hopefully they will keep their appt for next Tuesday, plan is to add HCTZ to regimen.

## 2014-01-23 NOTE — Telephone Encounter (Signed)
Call from [facility] with [facility] - # [phone]  Nurse reports Sliding Scale used at home: Novolog Insulin: 91 - 160    5 units 161- 230   7 units 231-300    9 units 301- 370   11 units 371 - 440  13 units 441 - 510   15 units  Knees better, pain improved  BP today 156/88 CBG today 110

## 2014-01-24 ENCOUNTER — Telehealth: Payer: Self-pay | Admitting: *Deleted

## 2014-01-24 NOTE — Telephone Encounter (Signed)
Pt's daughter called to report that pt's stool is black and runny. Stool was also black last week. They were told to call and report any black stool. Daughter states pt is not acting like her normal. Pt has been up today but seems weaker.   She is alert and talking.  I ask if they could bring her to clinic today but it takes 2 hours to get her dressed.  She will be able to come in tomorrow 3:45. I advised her to go to ED if any changes in LOC, increase in Black stools or increase weakness.  She agrees.  Please advise

## 2014-01-24 NOTE — Telephone Encounter (Signed)
Ideally we would see the patient today, but if they are unable to bring her and she is alert and talking, waiting until tomorrow is probably OK.  She is on oral iron, and this could be another reason the stools are black.  I would be curious to know if she recently started the oral iron or recently restarted it.  I would also be curious if she is now dizzy or lightheaded when she stands up.  She is on a stool softener and this should be held while she has runny stools.  We should remember she has meloxicam on her medication list (although it is unclear if she is still taking this as it looks to have been ineffective for her knee pain recently) as this could cause an NSAID associated gastritis with some bleeding but she is also on a PPI which should be somewhat protective.  Is she currently taking the meloxicam?  Other than getting the answers to the questions I have, and holding the senokot and meloxicam, if she is taking it, I have no further advice.

## 2014-01-24 NOTE — Telephone Encounter (Signed)
Talked to daughter again and she states Pt has been on iron for months.  This is not new.   Pt is not complaining of dizziness or being lightheaded.  She feels weak. They will hold stool softener.  Meloxicam was stopped awhile ago.  She is taking Tylenol #3. She did state pt was in hospital in September and had dark stools and was told she had a "Gastric Bleed"  Family was not aware of this until [facility]

## 2014-01-24 NOTE — Telephone Encounter (Signed)
Pt daughter called back but no answer.  I will try again.

## 2014-01-25 ENCOUNTER — Encounter: Payer: Self-pay | Admitting: Internal Medicine

## 2014-01-25 ENCOUNTER — Ambulatory Visit (INDEPENDENT_AMBULATORY_CARE_PROVIDER_SITE_OTHER): Payer: Medicare Other | Admitting: Internal Medicine

## 2014-01-25 VITALS — HR 78 | Temp 98.1°F

## 2014-01-25 DIAGNOSIS — R195 Other fecal abnormalities: Secondary | ICD-10-CM

## 2014-01-25 LAB — POC HEMOCCULT BLD/STL (OFFICE/1-CARD/DIAGNOSTIC): FECAL OCCULT BLD: NEGATIVE

## 2014-01-25 NOTE — Patient Instructions (Signed)
General Instructions:   Please check your blood pressure everyday. It is very likely that your dark stools are from the iron tablets you are taking. Continue with the aspirin everyday.   Please bring your medicines with you each time you come to clinic.  Medicines may include prescription medications, over-the-counter medications, herbal remedies, eye drops, vitamins, or other pills.

## 2014-01-25 NOTE — Progress Notes (Signed)
Patient ID: [name], female   DOB: January 16, 1934, 80 y.o.   MRN: [id number]   Subjective:   Patient ID: [name] female   DOB: Jul 03, 1933 80 y.o.   MRN: [id number]  HPI: [name] is a 80 y.o. with PMH listed below Presented today with complaints of dark stools. Family not exactly sure when it started, but daughter noticed it for the first time 1 week ago. Pt can not say, pts daughter did most of the talking. When I asked pt she say she does not know. Pt denies epigastric pain, vomiting of blood, nausea or bloody stools. Pt also denies dizziness. Pt has been taking Aspirin, Mobic and iron suppliments. Pts [place], stopped the Mobic, a few days after she started having dark stools as it was not helping with pts knee pain. Pt last bowel movement was yesterday, and she says the stools were the same colour without obvious blood, and were dark.  Past Medical History  Diagnosis Date  . Diabetes mellitus 2007    HgA1C (02/20/2010) = 9.2, HgA1C (03/20/2009) = 12.1  . CVA (cerebrovascular accident) 6440     right embolic stroke, no residual deficits  . Diverticulitis   . CVA (cerebral infarction) 7-yrs ago  . Arthritis   . Dysphagia   . CHF (congestive heart failure)   . DIABETES MELLITUS, TYPE II 12/04/2005  . HYPERLIPIDEMIA 12/04/2005  . HYPERTENSION 12/04/2005  . GERD 12/04/2005  . ARTHRITIS, KNEE 03/24/2006  . Lung mass 07/08/2010  . Meningioma 07/21/2010  . Hemangioma of liver 12/02/2010  . Depression 12/02/2010  . Adenocarcinoma of lung      Right upper lobe adenocarcinoma. s/p right lower lobectomy 12/15/10  . 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 acetaminophen-codeine (TYLENOL #3) 300-30 MG per tablet Take 1 tablet by mouth 2 (two) times daily as needed for moderate pain. 30 tablet 0  . albuterol (PROVENTIL HFA;VENTOLIN HFA) 108 (90 BASE) MCG/ACT inhaler Inhale 2 puffs  into the lungs every 4 (four) hours as needed for wheezing. 3 Inhaler 1  . albuterol (PROVENTIL) (2.5 MG/3ML) 0.083% nebulizer solution Take 3 mLs (2.5 mg total) by nebulization every 6 (six) hours as needed for wheezing. 75 mL 12  . aspirin 81 MG tablet Take 81 mg by mouth daily.     . carvedilol (COREG) 25 MG tablet Take 1 tablet (25 mg total) by mouth 2 (two) times daily with a meal. 180 tablet 4  . FERREX 150 150 MG capsule TAKE 1 CAPSULE (150 MG TOTAL) BY MOUTH DAILY. 30 capsule 1  . FLUoxetine (PROZAC) 10 MG capsule Take 1 capsule (10 mg total) by mouth at bedtime. 30 capsule 3  . furosemide (LASIX) 20 MG tablet Take 1 tablet (20 mg total) by mouth daily. 90 tablet 3  . glucose blood (ACCU-CHEK AVIVA) test strip Use to Check Blood Sugars 3 Times Daily.Dx Code: 250.00. Insulin dependent 100 each 12  . insulin aspart (NOVOLOG FLEXPEN) 100 UNIT/ML FlexPen <70 2 units if going to eat- skip if going to skip meal, 71-140 4 units; 141-210 6 units; 211-280 8 units (Patient taking differently: Novolog Insulin: 91 - 160 5 units 161- 230 7 units 231-300 9 units 301- 370 11 units 371 - 440 13 units 441 - 510 15 units) 15 mL 11  . Insulin Glargine (LANTUS SOLOSTAR) 100 UNIT/ML Solostar Pen Inject 17 Units  into the skin at bedtime. 10 mL 5  . levETIRAcetam (KEPPRA) 750 MG tablet Take 1 tablet (750 mg total) by mouth 2 (two) times daily. 60 tablet 3  . lisinopril (PRINIVIL,ZESTRIL) 40 MG tablet Take 1 tablet (40 mg total) by mouth daily. 30 tablet 3  . meloxicam (MOBIC) 7.5 MG tablet Take 2 tablets (15 mg total) by mouth daily. 21 tablet 0  . omeprazole (PRILOSEC) 20 MG capsule Take 1 capsule (20 mg total) by mouth daily. 30 capsule 3  . senna-docusate (SENOKOT-S) 8.6-50 MG per tablet Take 1 tablet by mouth 2 (two) times daily. 30 tablet 2  . simvastatin (ZOCOR) 20 MG tablet Take 20 mg by mouth daily.     No current facility-administered medications for this visit.   Family History    Problem Relation Age of Onset  . Hyperlipidemia Brother   . Hypertension Brother   . Diabetes Brother    History   Social History  . Marital Status: Widowed    Spouse Name: N/A    Number of Children: N/A  . Years of Education: 10   Occupational History  .  Unemployed   Social History Main Topics  . Smoking status: Former Smoker    Types: Cigarettes    Quit date: 02/17/2000  . Smokeless tobacco: Never Used  . Alcohol Use: No  . Drug Use: No  . Sexual Activity: Not Currently   Other Topics Concern  . None   Social History Narrative   Patient requests to use [employer] for her diabets testing supplies as of 09/05/2009.   Review of Systems: CONSTITUTIONAL- No Fever, weightloss, night sweat or change in appetite. SKIN- No Rash, colour changes or itching. HEAD- One episodes of headache, over the week., no dizziness Mouth/throat- No Sorethroat, dentures, or bleeding gums. RESPIRATORY- No Cough or SOB. CARDIAC- No Palpitations, DOE, PND or chest pain. GI- No nausea, vomiting, diarrhoea, constipation, abd pain. URINARY- No Frequency, urgency, straining or dysuria. NEUROLOGIC- No Numbness, syncope, seizures or burning. [facility]- Denies depression or anxiety.  Objective:  Physical Exam: Filed Vitals:   01/25/14 1628  Pulse: 78  Temp: 98.1 F (36.7 C)  TempSrc: Oral  SpO2: 100%   GENERAL- alert, co-operative, appears as stated age, not in any distress. HEENT- Atraumatic, normocephalic, PERRL, EOMI, oral mucosa appears moist, neck supple. CARDIAC- Regular, no added sounds RESP- Moving equal volumes of air ABDOMEN- Soft, nontender,  bowel sounds present. Rectal exam- No skin tags, masses or lesions, no hemorrhoids, normal anal tone, no masses appreciated, rectal vault with feaces present, gloved finger stained with dark facaes, without blood, FOBT done in clinic- negative NEURO- No obvious Cr N abnormality, strenght upper and lower extremities- intact, Gait-  Normal. EXTREMITIES- pulse 2+, symmetric, no pedal edema. SKIN- Warm, dry, No rash or lesion. PSYCH- Normal mood and affect, appropriate thought content and speech.  Assessment & Plan:   The patient's case and plan of care was discussed with attending physician, Dr. Daryll Drown.  Please see problem based charting for assessment and plan.

## 2014-01-25 NOTE — Assessment & Plan Note (Signed)
Dark stools likely due to iron suppliments. Doubt GI bleed, with no vomiting, nausea, epigastric pain or blood in stools. Pt has been having persistent dark stools, family unsure about when it started, pt doesn't know. Mobic was discont. Still on Aspirin. Bp stable, 173/69, appears it runs high.. Pt without dizziness. Last Endoscopy- 2012- mild gastritis, hiatal hernia and stricture. Last Colonoscopy- 2004- Diverticulosis with right colon lipoma.  Plan- Will check CBC - Cont Aspirin for now, as no evidence of Gi bleed, pending CBC. - FOBT- negative - family to check blood presssure daily, bring log. - No adjustments to Bp meds in teh setting of recent ?GI bleed. - Family- daughter, granddaughter and pt told about alarm symptoms and return precautions. Family voiced understanding.

## 2014-01-26 LAB — CBC
HEMATOCRIT: 32.1 % — AB (ref 36.0–46.0)
Hemoglobin: 10 g/dL — ABNORMAL LOW (ref 12.0–15.0)
MCH: 23.5 pg — ABNORMAL LOW (ref 26.0–34.0)
MCHC: 31.2 g/dL (ref 30.0–36.0)
MCV: 75.4 fL — AB (ref 78.0–100.0)
MPV: 10.3 fL (ref 9.4–12.4)
Platelets: 328 10*3/uL (ref 150–400)
RBC: 4.26 MIL/uL (ref 3.87–5.11)
RDW: 16.9 % — AB (ref 11.5–15.5)
WBC: 7.7 10*3/uL (ref 4.0–10.5)

## 2014-01-26 NOTE — Progress Notes (Signed)
Internal Medicine Clinic Attending  Case discussed with Dr. Denton Brick at the time of the visit.  We reviewed the resident's history and exam and pertinent patient test results.  I agree with the assessment, diagnosis, and plan of care documented in the resident's note.  Reviewed patient's CBC.  H/H are stable.

## 2014-01-26 NOTE — Addendum Note (Signed)
Addended by: Gilles Chiquito B on: 01/26/2014 10:03 AM   Modules accepted: Level of Service

## 2014-01-29 ENCOUNTER — Telehealth: Payer: Self-pay | Admitting: *Deleted

## 2014-01-29 NOTE — Telephone Encounter (Signed)
Received call from pt's dtr stating that pt was seen in [facility] last week and was supposed to get a refill on tylenol #3, but rx was not at pharmacy.  Will send to MD who saw pt on 12/10 for review.  Please advise.[name]12/14/20159:44 AM

## 2014-01-29 NOTE — Telephone Encounter (Signed)
Called patient and got the voice mail. Recommened Tylenol arthritis. Tylenol #3, has codiene and is a controlled substance, cannot be called in.  [name].

## 2014-01-30 ENCOUNTER — Telehealth: Payer: Self-pay | Admitting: Internal Medicine

## 2014-01-30 ENCOUNTER — Encounter: Payer: Self-pay | Admitting: Internal Medicine

## 2014-01-30 ENCOUNTER — Ambulatory Visit (INDEPENDENT_AMBULATORY_CARE_PROVIDER_SITE_OTHER): Payer: Medicare Other | Admitting: Internal Medicine

## 2014-01-30 VITALS — BP 179/83 | HR 72 | Temp 98.2°F | Wt 210.1 lb

## 2014-01-30 DIAGNOSIS — J208 Acute bronchitis due to other specified organisms: Secondary | ICD-10-CM | POA: Diagnosis not present

## 2014-01-30 DIAGNOSIS — M129 Arthropathy, unspecified: Secondary | ICD-10-CM

## 2014-01-30 DIAGNOSIS — I10 Essential (primary) hypertension: Secondary | ICD-10-CM

## 2014-01-30 DIAGNOSIS — Z794 Long term (current) use of insulin: Secondary | ICD-10-CM | POA: Diagnosis not present

## 2014-01-30 DIAGNOSIS — M1990 Unspecified osteoarthritis, unspecified site: Secondary | ICD-10-CM

## 2014-01-30 DIAGNOSIS — IMO0002 Reserved for concepts with insufficient information to code with codable children: Secondary | ICD-10-CM

## 2014-01-30 DIAGNOSIS — E1165 Type 2 diabetes mellitus with hyperglycemia: Secondary | ICD-10-CM

## 2014-01-30 LAB — GLUCOSE, CAPILLARY: Glucose-Capillary: 120 mg/dL — ABNORMAL HIGH (ref 70–99)

## 2014-01-30 MED ORDER — HYDROCHLOROTHIAZIDE 25 MG PO TABS: 25.0000 mg | ORAL_TABLET | Freq: Every day | ORAL | Status: DC

## 2014-01-30 MED ORDER — GUAIFENESIN-CODEINE 100-6.3 MG/5ML PO SOLN: 15.0000 mL | Freq: Four times a day (QID) | ORAL | Status: DC

## 2014-01-30 NOTE — Telephone Encounter (Signed)
   Reason for call:   I received a call from [name], the daughter of [name] at 7 PM indicating that her mother was prescribed guaifenesin-codeine at a clinic visit today but it is not covered by Medicare.   Pertinent Data:   Chart review confirms that she was prescribed guaifenesin-codeine solution at today's [facility] visit.   The patient has Mucinex at home.    Assessment / Plan / Recommendations:   I advised the patient's daughter to use Mucinex for now and to check with the pharmacy to see if there is an alternative that is covered.    I informed her I would forward this message to her mother's PCP.  As always, pt is advised that if symptoms worsen or new symptoms arise, they should go to an urgent care facility or to to ER for further evaluation.   [name] Oman, DO   01/30/2014, 7:58 PM

## 2014-01-30 NOTE — Patient Instructions (Addendum)
General Instructions:  Please bring your medicines with you each time you come to clinic.  Medicines may include prescription medications, over-the-counter medications, herbal remedies, eye drops, vitamins, or other pills.  Please stop your lasix (furosemide) and start hydrochlorothiazide 25mg  daily for your blood pressure and continue your lisinopril and carvedilol  For your cough lets try the syrup every 6 hours for now which should help with pain as well  Keep checking your blood pressure at home and let me know, return in 2 weeks for follow up  Please follow up with your eye doctor, call to see when they wanted to see you back.   If your headache gets worse or if you have any sudden change in vision please let me know right away or if severe go to emergency room  Progress Toward Treatment Goals:  Treatment Goal 01/30/2014  Hemoglobin A1C improved  Blood pressure deteriorated    Self Care Goals & Plans:  Self Care Goal 01/25/2014  Manage my medications take my medicines as prescribed; bring my medications to every visit; refill my medications on time; follow the sick day instructions if I am sick  Monitor my health keep track of my blood glucose; keep track of my blood pressure  Eat healthy foods eat more vegetables; eat fruit for snacks and desserts; eat baked foods instead of fried foods; eat foods that are low in salt; drink diet soda or water instead of juice or soda  Be physically active find an activity I enjoy    Home Blood Glucose Monitoring 01/30/2014  Check my blood sugar 3 times a day  When to check my blood sugar before meals     Care Management & Community Referrals:  Referral 10/13/2013  Referrals made for care management support social worker    Codeine; Guaifenesin oral solution or syrup What is this medicine? CODEINE; GUAIFENESIN (KOE deen; gwye FEN e sin) is a cough suppressant and expectorant. It helps to stop or reduce coughing due to the common cold  or inhaled irritants. It will not treat an infection. This medicine may be used for other purposes; ask your health care provider or pharmacist if you have questions. COMMON BRAND NAME(S): Antituss AC, Brontex, Cheracol with Codeine, Cheratussin AC, Codefen, Dex-Tuss, Diabetic Tussin C, Duraganidin NR, ExeClear-C, Gani-Tuss NR, Guai Co, Guaiatussin AC, Guaifen C, Guiatuss AC, Halotussin AC, Iophen C-NR, M-Clear WC, Mar-Cof CG, Mytussin AC, Robafen AC, Romilar AC, Tussi-Organidin NR, Tussiden C, Tusso-C, Virtussin AC What should I tell my health care provider before I take this medicine? They need to know if you have any of these conditions: -Addison's disease -convulsions -drug or alcohol abuse or addiction -enlarged prostate -fever -intestinal or stomach problems -kidney disease -liver disease -lung disease like asthma or emphysema -recent surgery or injury -thyroid disease -an allergic or unusual reaction to codeine, hydromorphone, hydrocodone, oxycodone, morphine, guaifenesin, other medicines, foods, dyes, or preservatives -pregnant or trying to get pregnant -breast-feeding How should I use this medicine? Take this medicine by mouth with a full glass of water. Follow the directions on the prescription label. Use a specially marked spoon or container to measure your medicine. Ask your pharmacist if you do not have one. Household spoons are not accurate. Take this medicine with food or milk if it upsets your stomach. Take your doses at regular times. Do not take more medicine than directed. Talk to your pediatrician regarding the use of this medicine in children. Special care may be needed. Overdosage: If you think  you have taken too much of this medicine contact a poison control center or emergency room at once. NOTE: This medicine is only for you. Do not share this medicine with others. What if I miss a dose? If you miss a dose, take it as soon as you can. If it is almost time for your  next dose, take only that dose. Do not take double or extra doses. What may interact with this medicine? -alcohol -antihistamines for allergy, cough and cold -certain medicines for depression, anxiety, or psychotic disturbances -certain medicines for sleep -MAOIs like Carbex, Eldepryl, Marplan, Nardil, and Parnate -muscle relaxants -narcotic medicines (opiates) for pain -tramadol This list may not describe all possible interactions. Give your health care provider a list of all the medicines, herbs, non-prescription drugs, or dietary supplements you use. Also tell them if you smoke, drink alcohol, or use illegal drugs. Some items may interact with your medicine. What should I watch for while using this medicine? You may develop tolerance to this medicine if you take it for a long time. Tolerance means that you will get less cough relief with time. Tell your doctor or health care professional if your symptoms do not improve or if they get worse. If you have a high fever, skin rash, or headache, see your health care professional. Do not suddenly stop taking your medicine because you may develop a severe reaction. Your body becomes used to the medicine. This does NOT mean you are addicted. Addiction is a behavior related to getting and using a drug for a non-medical reason. If your doctor wants you to stop the medicine, the dose will be slowly lowered over time to avoid any side effects. Drink several glasses of water each day. You may get drowsy or dizzy. Do not drive, use machinery, or do anything that needs mental alertness until you know how this medicine affects you. Do not stand or sit up quickly, especially if you are an older patient. This reduces the risk of dizzy or fainting spells. Alcohol may interfere with the effect of this medicine. Avoid alcoholic drinks. The medicine may cause constipation. Try to have a bowel movement at least every 2 to 3 days. If you do not have a bowel movement for 3  days, call your doctor or health care professional. What side effects may I notice from receiving this medicine? Side effects that you should report to your doctor or health care professional as soon as possible: -allergic reactions like skin rash, itching or hives, swelling of the face, lips, or tongue -breathing problems -confusion -hallucinations -irregular heartbeat -seizures -trouble passing urine Side effects that usually do not require medical attention (report to your doctor or health care professional if they continue or are bothersome): -blurred vision -constipation -flushing or sweating -headache -stomach upset, nausea This list may not describe all possible side effects. Call your doctor for medical advice about side effects. You may report side effects to FDA at 1-800-FDA-1088. Where should I keep my medicine? Keep out of the reach of children. This medicine can be abused. Keep your medicine in a safe place to protect it from theft. Do not share this medicine with anyone. Selling or giving away this medicine is dangerous and against the law. Store at room temperature between 15 and 30 degrees C (59 and 86 degrees F). Protect from light. Throw away any unused medicine after the expiration date. Discard unused medicine and used packaging carefully. Pets and children can be harmed if they find  used or lost packages. NOTE: This sheet is a summary. It may not cover all possible information. If you have questions about this medicine, talk to your doctor, pharmacist, or health care provider.  2015, Elsevier/Gold Standard. (2010-10-14 15:50:37)  Hydrochlorothiazide, HCTZ capsules or tablets What is this medicine? HYDROCHLOROTHIAZIDE (hye droe klor oh THYE a zide) is a diuretic. It increases the amount of urine passed, which causes the body to lose salt and water. This medicine is used to treat high blood pressure. It is also reduces the swelling and water retention caused by various  medical conditions, such as heart, liver, or kidney disease. This medicine may be used for other purposes; ask your health care provider or pharmacist if you have questions. COMMON BRAND NAME(S): Esidrix, Ezide, HydroDIURIL, Microzide, Oretic, Zide What should I tell my health care provider before I take this medicine? They need to know if you have any of these conditions: -diabetes -gout -immune system problems, like lupus -kidney disease or kidney stones -liver disease -pancreatitis -small amount of urine or difficulty passing urine -an unusual or allergic reaction to hydrochlorothiazide, sulfa drugs, other medicines, foods, dyes, or preservatives -pregnant or trying to get pregnant -breast-feeding How should I use this medicine? Take this medicine by mouth with a glass of water. Follow the directions on the prescription label. Take your medicine at regular intervals. Remember that you will need to pass urine frequently after taking this medicine. Do not take your doses at a time of day that will cause you problems. Do not stop taking your medicine unless your doctor tells you to. Talk to your pediatrician regarding the use of this medicine in children. Special care may be needed. Overdosage: If you think you have taken too much of this medicine contact a poison control center or emergency room at once. NOTE: This medicine is only for you. Do not share this medicine with others. What if I miss a dose? If you miss a dose, take it as soon as you can. If it is almost time for your next dose, take only that dose. Do not take double or extra doses. What may interact with this medicine? -cholestyramine -colestipol -digoxin -dofetilide -lithium -medicines for blood pressure -medicines for diabetes -medicines that relax muscles for surgery -other diuretics -steroid medicines like prednisone or cortisone This list may not describe all possible interactions. Give your health care provider a  list of all the medicines, herbs, non-prescription drugs, or dietary supplements you use. Also tell them if you smoke, drink alcohol, or use illegal drugs. Some items may interact with your medicine. What should I watch for while using this medicine? Visit your doctor or health care professional for regular checks on your progress. Check your blood pressure as directed. Ask your doctor or health care professional what your blood pressure should be and when you should contact him or her. You may need to be on a special diet while taking this medicine. Ask your doctor. Check with your doctor or health care professional if you get an attack of severe diarrhea, nausea and vomiting, or if you sweat a lot. The loss of too much body fluid can make it dangerous for you to take this medicine. You may get drowsy or dizzy. Do not drive, use machinery, or do anything that needs mental alertness until you know how this medicine affects you. Do not stand or sit up quickly, especially if you are an older patient. This reduces the risk of dizzy or fainting spells. Alcohol  may interfere with the effect of this medicine. Avoid alcoholic drinks. This medicine may affect your blood sugar level. If you have diabetes, check with your doctor or health care professional before changing the dose of your diabetic medicine. This medicine can make you more sensitive to the sun. Keep out of the sun. If you cannot avoid being in the sun, wear protective clothing and use sunscreen. Do not use sun lamps or tanning beds/booths. What side effects may I notice from receiving this medicine? Side effects that you should report to your doctor or health care professional as soon as possible: -allergic reactions such as skin rash or itching, hives, swelling of the lips, mouth, tongue, or throat -changes in vision -chest pain -eye pain -fast or irregular heartbeat -feeling faint or lightheaded, falls -gout attack -muscle pain or  cramps -pain or difficulty when passing urine -pain, tingling, numbness in the hands or feet -redness, blistering, peeling or loosening of the skin, including inside the mouth -unusually weak or tired Side effects that usually do not require medical attention (report to your doctor or health care professional if they continue or are bothersome): -change in sex drive or performance -dry mouth -headache -stomach upset This list may not describe all possible side effects. Call your doctor for medical advice about side effects. You may report side effects to FDA at 1-800-FDA-1088. Where should I keep my medicine? Keep out of the reach of children. Store at room temperature between 15 and 30 degrees C (59 and 86 degrees F). Do not freeze. Protect from light and moisture. Keep container closed tightly. Throw away any unused medicine after the expiration date. NOTE: This sheet is a summary. It may not cover all possible information. If you have questions about this medicine, talk to your doctor, pharmacist, or health care provider.  2015, Elsevier/Gold Standard. (2009-09-27 12:57:37)

## 2014-01-31 ENCOUNTER — Telehealth: Payer: Self-pay | Admitting: *Deleted

## 2014-01-31 DIAGNOSIS — I10 Essential (primary) hypertension: Secondary | ICD-10-CM | POA: Diagnosis not present

## 2014-01-31 DIAGNOSIS — G40909 Epilepsy, unspecified, not intractable, without status epilepticus: Secondary | ICD-10-CM | POA: Diagnosis not present

## 2014-01-31 DIAGNOSIS — Z9119 Patient's noncompliance with other medical treatment and regimen: Secondary | ICD-10-CM | POA: Diagnosis not present

## 2014-01-31 DIAGNOSIS — I5022 Chronic systolic (congestive) heart failure: Secondary | ICD-10-CM | POA: Diagnosis not present

## 2014-01-31 DIAGNOSIS — E119 Type 2 diabetes mellitus without complications: Secondary | ICD-10-CM | POA: Diagnosis not present

## 2014-01-31 DIAGNOSIS — D376 Neoplasm of uncertain behavior of liver, gallbladder and bile ducts: Secondary | ICD-10-CM | POA: Diagnosis not present

## 2014-01-31 MED ORDER — ACETAMINOPHEN-CODEINE #3 300-30 MG PO TABS: 1.0000 | ORAL_TABLET | Freq: Two times a day (BID) | ORAL | Status: DC | PRN

## 2014-01-31 MED ORDER — GUAIFENESIN-DM 100-10 MG/5ML PO SYRP: 5.0000 mL | ORAL_SOLUTION | ORAL | Status: DC | PRN

## 2014-01-31 NOTE — Telephone Encounter (Signed)
Pt's daughter calls and states pt's medicaid/ medicare will not pay for the cough syrup that was prescribed, she would like the tylenol #3 prescribed and she will get some mucinex

## 2014-01-31 NOTE — Telephone Encounter (Signed)
Patient cannot afford cough syrup with codeine that was prescribed. She will try mucinex and can also try robitussin dm for cough. Tylenol #3 refilled for her pain, will try for a month to see if helping at all as discussed during visit.

## 2014-01-31 NOTE — Telephone Encounter (Signed)
Has she started the hctz? In addition to her other BP medications?

## 2014-01-31 NOTE — Telephone Encounter (Signed)
Call from [facility] with [facility] - # [phone]  Nurse reports 12/13:   BP 214/96 and 200/91 She also reports last night she had to use her Rt leg under her Left leg to move it.  Today leg is fine, no other problems. Pt was seen in clinic 12/15

## 2014-01-31 NOTE — Progress Notes (Signed)
Subjective:   Patient ID: [name] female   DOB: [birth year] 80 y.o.   MRN: [id number]  HPI: [name] is a 80 y.o. female with PMH as listed below presenting to opc today for HTN follow up.   HTN--remains uncontrolled despite medication compliance. Will adjust medications today.   Productive intermittent cough--~1 month, getting worse mainly at night, now able to bring up yellow sputum. Denies any blood in sputum. Daughter endorses she has some trouble breathing in the middle of her coughing but patient denies. Daughter does report improvement with albuterol inhaler prn. She has also been giving her mother otc cough syrup with mild improvement. [name] denies congestion, sore throat, or chest pain.   Daughter endorses improvement of arthritis pain with tylenol #3 but did not have any refills.  She says the mobic did not seem to help at all, but that her mom asks for the tylenol #3 when she has severe pain, usually taking once or twice a day when needed. [name] does not remember if the tylenol #3 helped.  She might be confused between her medications and does not know their names. Patient does mention possibly needing a wheelchair as her legs start to hurt with walking. Daughter denies any recent falls and says she is able to get around with her walker but they supervise.    Headache--started today, across forehead and radiating down to around right eye. No tearing of eye and denies vision change, or prior similar episodes. Headache initially when she arrived to opc, but currently down to 2/10 and seems to be improving.   Past Medical History  Diagnosis Date  . Diabetes mellitus 2007    HgA1C (02/20/2010) = 9.2, HgA1C (03/20/2009) = 12.1  . CVA (cerebrovascular accident) 7322     right embolic stroke, no residual deficits  . Diverticulitis   . CVA (cerebral infarction) 7-yrs ago  . Arthritis   . Dysphagia   . CHF (congestive heart failure)   . DIABETES MELLITUS, TYPE II  12/04/2005  . HYPERLIPIDEMIA 12/04/2005  . HYPERTENSION 12/04/2005  . GERD 12/04/2005  . ARTHRITIS, KNEE 03/24/2006  . Lung mass 07/08/2010  . Meningioma 07/21/2010  . Hemangioma of liver 12/02/2010  . Depression 12/02/2010  . Adenocarcinoma of lung      Right upper lobe adenocarcinoma. s/p right lower lobectomy 12/15/10  . 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 albuterol (PROVENTIL HFA;VENTOLIN HFA) 108 (90 BASE) MCG/ACT inhaler Inhale 2 puffs into the lungs every 4 (four) hours as needed for wheezing. 3 Inhaler 1  . aspirin 81 MG tablet Take 81 mg by mouth daily.     . carvedilol (COREG) 25 MG tablet Take 1 tablet (25 mg total) by mouth 2 (two) times daily with a meal. 180 tablet 4  . FERREX 150 150 MG capsule TAKE 1 CAPSULE (150 MG TOTAL) BY MOUTH DAILY. 30 capsule 1  . FLUoxetine (PROZAC) 10 MG capsule Take 1 capsule (10 mg total) by mouth at bedtime. 30 capsule 3  . glucose blood (ACCU-CHEK AVIVA) test strip Use to Check Blood Sugars 3 Times Daily.Dx Code: 250.00. Insulin dependent 100 each 12  . insulin aspart (NOVOLOG FLEXPEN) 100 UNIT/ML FlexPen <70 2 units if going to eat- skip if going to skip meal, 71-140 4 units; 141-210 6 units; 211-280 8 units (Patient taking differently: Novolog Insulin: 91 - 160  5 units 161- 230 7 units 231-300 9 units 301- 370 11 units 371 - 440 13 units 441 - 510 15 units) 15 mL 11  . Insulin Glargine (LANTUS SOLOSTAR) 100 UNIT/ML Solostar Pen Inject 17 Units into the skin at bedtime. 10 mL 5  . levETIRAcetam (KEPPRA) 750 MG tablet Take 1 tablet (750 mg total) by mouth 2 (two) times daily. 60 tablet 3  . lisinopril (PRINIVIL,ZESTRIL) 40 MG tablet Take 1 tablet (40 mg total) by mouth daily. 30 tablet 3  . omeprazole (PRILOSEC) 20 MG capsule Take 1 capsule (20 mg total) by mouth daily. 30 capsule 3  . senna-docusate (SENOKOT-S) 8.6-50 MG per tablet  Take 1 tablet by mouth 2 (two) times daily. 30 tablet 2  . simvastatin (ZOCOR) 20 MG tablet Take 20 mg by mouth daily.    Marland Kitchen acetaminophen-codeine (TYLENOL #3) 300-30 MG per tablet Take 1 tablet by mouth 2 (two) times daily as needed for moderate pain. 60 tablet 0  . albuterol (PROVENTIL) (2.5 MG/3ML) 0.083% nebulizer solution Take 3 mLs (2.5 mg total) by nebulization every 6 (six) hours as needed for wheezing. (Patient not taking: Reported on 01/30/2014) 75 mL 12  . guaiFENesin-dextromethorphan (ROBITUSSIN DM) 100-10 MG/5ML syrup Take 5 mLs by mouth every 4 (four) hours as needed for cough. 118 mL 0  . hydrochlorothiazide (HYDRODIURIL) 25 MG tablet Take 1 tablet (25 mg total) by mouth daily. 30 tablet 1   No current facility-administered medications for this visit.   Family History  Problem Relation Age of Onset  . Hyperlipidemia Brother   . Hypertension Brother   . Diabetes Brother    History   Social History  . Marital Status: Widowed    Spouse Name: N/A    Number of Children: N/A  . Years of Education: 10   Occupational History  .  Unemployed   Social History Main Topics  . Smoking status: Former Smoker    Types: Cigarettes    Quit date: 02/17/2000  . Smokeless tobacco: Never Used  . Alcohol Use: No  . Drug Use: No  . Sexual Activity: Not Currently   Other Topics Concern  . None   Social History Narrative   Patient requests to use [facility] for her diabets testing supplies as of 09/05/2009.   Review of Systems:  Constitutional:  Denies fever, chills  HEENT:  Denies congestion, sore throat, rhinorrhea  Respiratory:  Cough  Cardiovascular:  Denies chest pain  Gastrointestinal:  Denies abdominal pain  Musculoskeletal:  B/l knee pain at times  Neurological:  Weakness    Objective:  Physical Exam: Filed Vitals:   01/30/14 1514 01/30/14 1548  BP: 184/75 179/83  Pulse: 73 72  Temp: 98.2 F (36.8 C)   TempSrc: Oral   Weight: 210 lb 1.6 oz (95.301 kg)     SpO2: 100%    Vitals reviewed. General: sitting in wheelchair, NAD HEENT: EOMI, negative papilledema on fundoscopy (done by myself and Dr. Eppie Gibson), limited visualization of posterior pharynx due to patient cooperation. -tenderness to palpation of frontal, ethmoid, and maxillary sinuses  Cardiac: RRR Pulm: clear to auscultation bilaterally Abd: soft, BS present Ext: moving all extremities, -tenderness to palpation of b/l knees, -crepitus palpated, no inflammation, or erythema.  Neuro: alert and oriented to person and place  Assessment & Plan:  Discussed with Dr. Eppie Gibson

## 2014-01-31 NOTE — Telephone Encounter (Signed)
Called both to pharm

## 2014-02-01 DIAGNOSIS — J208 Acute bronchitis due to other specified organisms: Secondary | ICD-10-CM | POA: Insufficient documentation

## 2014-02-01 MED ORDER — INSULIN ASPART 100 UNIT/ML FLEXPEN: PEN_INJECTOR | SUBCUTANEOUS | Status: DC

## 2014-02-01 NOTE — Progress Notes (Signed)
I saw and evaluated the patient.  I personally confirmed the key portions of Dr. Demetrio Lapping history and exam and reviewed pertinent patient test results.  The assessment, diagnosis, and plan were formulated together and I agree with the documentation in the resident's note.  There was no papilledema on my fundoscopic examination.

## 2014-02-01 NOTE — Assessment & Plan Note (Addendum)
BP Readings from Last 3 Encounters:  01/30/14 179/83  12/26/13 171/68  12/11/13 158/70   Lab Results  Component Value Date   NA 143 12/26/2013   K 3.9 12/26/2013   CREATININE 0.89 12/26/2013    Assessment: Blood pressure control: moderately elevated Progress toward BP goal:  deteriorated Comments: remains uncontrolled despite medication compliance. She did have a mild headache today that seems like an isolated incident and was improving during her visit. No papilledema on fundoscopic exam.  Plan: Medications:  d/c lasix, start hctz 25mg , continue lisinopril 40mg , and coreg 25mg  bid Educational resources provided:   Self management tools provided:   HHN checks bp at home Other plans: recheck in ~[redacted] weeks along with bmet.  If remains elevated, will start low dose ccb (did have some lower extremity edema with norvasc in the past that was stopped)

## 2014-02-01 NOTE — Assessment & Plan Note (Addendum)
Lab Results  Component Value Date   HGBA1C 7.6 12/26/2013   HGBA1C 11.0* 10/06/2013   HGBA1C 9.7 03/28/2013    Assessment: Diabetes control: fair control Progress toward A1C goal:  improved Comments: recheck in feb, cbg's have been stable usually less than 150 at home. Denies hypoglycemia  Plan: Medications:  continue current medications lantus 17 units qhs, novolog sliding scale that daughter prefers to continue-- 91 - 160 5 units 161- 230 7 units 231-300 9 units 301- 370 11 units 371 - 440 13 units 441 - 510 15 units Home glucose monitoring: Frequency: 3 times a day Timing: before meals Instruction/counseling given: reminded to bring blood glucose meter & log to each visit and reminded to bring medications to each visit

## 2014-02-01 NOTE — Telephone Encounter (Signed)
I called the pharmacy ane HCTZ was picked up yesterday.   I called Elisama to be sure she was taking the med but no answer.  Message to call clinic was left. Also called HHN and made her aware and she will check meds at next visit.  The BP readings were from the 12/13 and were in the log pt brought to clinic. So not new.

## 2014-02-01 NOTE — Assessment & Plan Note (Addendum)
Will restart trial of tylenol #3 for one month to see if it provides any improvement per family request. She may benefit from topical medication such as voltaren gel which I will try to discuss with family on next visit, however, cost will certainly be an issue. I have encouraged Ms. Goedken to continue to try to move her extremities--she may benefit from some PT. She wishes to have a wheelchair, but the concern is that with wheelchair present, she may lose any motivation to continue to move. Daughter says currently she does well with the walker, is steady, and no recent falls. Pain is mainly in lower back, shoulders and knees. No palpable crepitus or obvious deformity on examination. Has failed high dose tylenol and mobic, but daughter says limited supply of tylenol #3 a few months ago seemed to help. In the past, she was on oxycodone, but I will try to avoid stronger opiates for Ms. Blahnik if possible. Tramadol would not be a good option as it can lower the seizure threshold.   -refilled 1 month supply of tylenol #3, codeine should also help with current cough, and re-evaluate -continue to encouraged mobility and strengthening exercises as tolerated

## 2014-02-01 NOTE — Assessment & Plan Note (Signed)
Recent worsening of cough mainly at night, now productive with yellow sputum. Cough has been ongoing for ~1 month per daughter. Denies any sinus pressure or congestion or sore throat. Currently, only occasional cough during visit today. No sob but daughter says when she coughs a lot it seems like she has a hard time breathing during the coughing spell. Improved with albuterol inhaler prn. Mild improvement with otc cough syrup.   -initially I prescribed guiaf-codeine, however, they cannot afford it. Instead, daughter would like to try mucinex and I also recommended robitussin DM to help with cough.  Otherwise, continue supportive measures. If worsening, or fever or chills or shortness of breath, advised to return for further evaluation

## 2014-02-05 ENCOUNTER — Other Ambulatory Visit: Payer: Self-pay | Admitting: Internal Medicine

## 2014-02-05 NOTE — Telephone Encounter (Signed)
Dear Bonnita Nasuti,  Please make sure this prescription has been canceled. She is now on 40mg  tablets.   Thanks,  Dr q

## 2014-02-07 ENCOUNTER — Other Ambulatory Visit: Payer: Self-pay | Admitting: Internal Medicine

## 2014-02-07 DIAGNOSIS — G40909 Epilepsy, unspecified, not intractable, without status epilepticus: Secondary | ICD-10-CM

## 2014-02-13 ENCOUNTER — Telehealth: Payer: Self-pay | Admitting: Internal Medicine

## 2014-02-13 NOTE — Telephone Encounter (Signed)
   Reason for call:   I received a call from Ms. Renard Matter daughter at 7:35 PM that her blood pressure tonight checked with a manual home blood pressure cuff device approximately 45 minutes ago was 98/72 after she had just finished eating dinner. She denied feeling lightheaded or dizzy. Her blood pressure was checked 15 minutes later in the same arm and had improved to 123/58. She again denies any symptoms. Her systolic blood pressure readings in the past few days have ranged from the 120-140's.      She also reports that tonight her blood sugar after eating dinner was 342 which is above her normal blood sugar range. She received her bedtime 17U of Lantus earlier tonight without her normal meal time Novolog coverage. She denies symptoms of hyperglycemia.    Pertinent Data:  She was seen by her PCP, Dr [name] on 01/30/14 where she was started on HCTZ 25 mg daily. She was also instructed to continue her previously prescribed lisinopril 40 mg daily and coreg 25 BID and discontinue furosemide 20 mg daily. Her blood pressure at that visit was 179/83 which has been consistently elevated since May of this year. She reports compliance with these medication changes.   In addition at her last clinic visit, she was instructed to continue Lantus 17 U daily and her Novolog sliding scale with meals which she also reports compliance with.        Assessment / Plan / Recommendations:     Mrs Riera has asymptomatic relative hypotension in setting of recent change in her anti-hypertensive regimen due to consistently elevated clinic blood pressure readings. It appears her home blood pressure readings have been stable since that time with no associated symptoms. I instructed to her continue this regimen with close monitoring of her blood pressure three times daily as previously stated by her PCP. She is to follow-up with her PCP in 1 week for follow-up which she verbalized understanding.  In regards to her  hyperglycemia, she was instructed to continue taking her normal insulin regimen and Lantus before bedtime. She was instructed to recheck her blood sugar a few hours later and if still elevated administer Novolog per sliding scale which she normally follows. She verbalized understanding.    As always, she was advised that if symptoms worsen or new symptoms arise, they should go to an urgent care facility or to to ER for further evaluation.   Juluis Mire, MD   02/13/2014, 7:58 PM

## 2014-02-14 NOTE — Telephone Encounter (Signed)
Thanks Dr. Naaman Plummer.  Can we please have her scheduled in opc for BP follow up next week? Also, she has home health RN who usually calls with latest BP and sugar reads. Hopefully she can do that sometime this week as well.   Thank you,  Dr q

## 2014-02-16 ENCOUNTER — Encounter (HOSPITAL_COMMUNITY): Payer: Self-pay

## 2014-02-16 ENCOUNTER — Observation Stay (HOSPITAL_COMMUNITY)
Admission: EM | Admit: 2014-02-16 | Discharge: 2014-02-18 | Disposition: A | Discharge status: Home / Self Care | Payer: Medicare Other | Attending: Internal Medicine | Admitting: Internal Medicine

## 2014-02-16 DIAGNOSIS — E785 Hyperlipidemia, unspecified: Secondary | ICD-10-CM | POA: Insufficient documentation

## 2014-02-16 DIAGNOSIS — E1169 Type 2 diabetes mellitus with other specified complication: Secondary | ICD-10-CM | POA: Diagnosis present

## 2014-02-16 DIAGNOSIS — D509 Iron deficiency anemia, unspecified: Secondary | ICD-10-CM | POA: Diagnosis not present

## 2014-02-16 DIAGNOSIS — F32A Depression, unspecified: Secondary | ICD-10-CM | POA: Diagnosis present

## 2014-02-16 DIAGNOSIS — Z8673 Personal history of transient ischemic attack (TIA), and cerebral infarction without residual deficits: Secondary | ICD-10-CM | POA: Diagnosis not present

## 2014-02-16 DIAGNOSIS — I10 Essential (primary) hypertension: Secondary | ICD-10-CM | POA: Diagnosis not present

## 2014-02-16 DIAGNOSIS — R197 Diarrhea, unspecified: Secondary | ICD-10-CM | POA: Diagnosis not present

## 2014-02-16 DIAGNOSIS — I152 Hypertension secondary to endocrine disorders: Secondary | ICD-10-CM | POA: Diagnosis present

## 2014-02-16 DIAGNOSIS — K219 Gastro-esophageal reflux disease without esophagitis: Secondary | ICD-10-CM | POA: Diagnosis present

## 2014-02-16 DIAGNOSIS — N179 Acute kidney failure, unspecified: Secondary | ICD-10-CM | POA: Diagnosis not present

## 2014-02-16 DIAGNOSIS — F329 Major depressive disorder, single episode, unspecified: Secondary | ICD-10-CM | POA: Diagnosis not present

## 2014-02-16 DIAGNOSIS — E1165 Type 2 diabetes mellitus with hyperglycemia: Secondary | ICD-10-CM

## 2014-02-16 DIAGNOSIS — E119 Type 2 diabetes mellitus without complications: Secondary | ICD-10-CM | POA: Diagnosis present

## 2014-02-16 DIAGNOSIS — Z87891 Personal history of nicotine dependence: Secondary | ICD-10-CM | POA: Insufficient documentation

## 2014-02-16 DIAGNOSIS — E860 Dehydration: Secondary | ICD-10-CM | POA: Diagnosis not present

## 2014-02-16 DIAGNOSIS — D649 Anemia, unspecified: Secondary | ICD-10-CM | POA: Diagnosis present

## 2014-02-16 DIAGNOSIS — Z85118 Personal history of other malignant neoplasm of bronchus and lung: Secondary | ICD-10-CM | POA: Diagnosis not present

## 2014-02-16 DIAGNOSIS — Z87898 Personal history of other specified conditions: Secondary | ICD-10-CM

## 2014-02-16 DIAGNOSIS — I5032 Chronic diastolic (congestive) heart failure: Secondary | ICD-10-CM | POA: Diagnosis present

## 2014-02-16 DIAGNOSIS — G40909 Epilepsy, unspecified, not intractable, without status epilepticus: Secondary | ICD-10-CM

## 2014-02-16 DIAGNOSIS — R42 Dizziness and giddiness: Secondary | ICD-10-CM | POA: Diagnosis not present

## 2014-02-16 DIAGNOSIS — E1159 Type 2 diabetes mellitus with other circulatory complications: Secondary | ICD-10-CM | POA: Diagnosis present

## 2014-02-16 DIAGNOSIS — I959 Hypotension, unspecified: Secondary | ICD-10-CM | POA: Diagnosis not present

## 2014-02-16 DIAGNOSIS — I951 Orthostatic hypotension: Secondary | ICD-10-CM | POA: Diagnosis present

## 2014-02-16 DIAGNOSIS — Z794 Long term (current) use of insulin: Secondary | ICD-10-CM | POA: Insufficient documentation

## 2014-02-16 DIAGNOSIS — R404 Transient alteration of awareness: Secondary | ICD-10-CM | POA: Diagnosis not present

## 2014-02-16 DIAGNOSIS — N390 Urinary tract infection, site not specified: Secondary | ICD-10-CM | POA: Diagnosis present

## 2014-02-16 DIAGNOSIS — I5020 Unspecified systolic (congestive) heart failure: Secondary | ICD-10-CM | POA: Diagnosis present

## 2014-02-16 DIAGNOSIS — E1140 Type 2 diabetes mellitus with diabetic neuropathy, unspecified: Secondary | ICD-10-CM | POA: Diagnosis present

## 2014-02-16 DIAGNOSIS — Z86711 Personal history of pulmonary embolism: Secondary | ICD-10-CM | POA: Diagnosis present

## 2014-02-16 DIAGNOSIS — K529 Noninfective gastroenteritis and colitis, unspecified: Secondary | ICD-10-CM | POA: Diagnosis present

## 2014-02-16 LAB — CBC WITH DIFFERENTIAL/PLATELET
BASOS PCT: 0 % (ref 0–1)
Basophils Absolute: 0 10*3/uL (ref 0.0–0.1)
EOS ABS: 0.1 10*3/uL (ref 0.0–0.7)
Eosinophils Relative: 1 % (ref 0–5)
HEMATOCRIT: 37 % (ref 36.0–46.0)
Hemoglobin: 11.5 g/dL — ABNORMAL LOW (ref 12.0–15.0)
Lymphocytes Relative: 24 % (ref 12–46)
Lymphs Abs: 1.8 10*3/uL (ref 0.7–4.0)
MCH: 24.3 pg — AB (ref 26.0–34.0)
MCHC: 31.1 g/dL (ref 30.0–36.0)
MCV: 78.1 fL (ref 78.0–100.0)
MONO ABS: 0.6 10*3/uL (ref 0.1–1.0)
Monocytes Relative: 8 % (ref 3–12)
NEUTROS ABS: 5 10*3/uL (ref 1.7–7.7)
Neutrophils Relative %: 67 % (ref 43–77)
Platelets: 236 10*3/uL (ref 150–400)
RBC: 4.74 MIL/uL (ref 3.87–5.11)
RDW: 16.3 % — ABNORMAL HIGH (ref 11.5–15.5)
WBC: 7.5 10*3/uL (ref 4.0–10.5)

## 2014-02-16 LAB — COMPREHENSIVE METABOLIC PANEL
ALBUMIN: 3.6 g/dL (ref 3.5–5.2)
ALK PHOS: 67 U/L (ref 39–117)
ALT: 12 U/L (ref 0–35)
AST: 16 U/L (ref 0–37)
Anion gap: 11 (ref 5–15)
BILIRUBIN TOTAL: 0.4 mg/dL (ref 0.3–1.2)
BUN: 36 mg/dL — AB (ref 6–23)
CHLORIDE: 105 meq/L (ref 96–112)
CO2: 21 mmol/L (ref 19–32)
CREATININE: 1.47 mg/dL — AB (ref 0.50–1.10)
Calcium: 9.1 mg/dL (ref 8.4–10.5)
GFR calc non Af Amer: 33 mL/min — ABNORMAL LOW (ref >90)
GFR, EST AFRICAN AMERICAN: 38 mL/min — AB (ref >90)
Glucose, Bld: 177 mg/dL — ABNORMAL HIGH (ref 70–99)
POTASSIUM: 4.4 mmol/L (ref 3.5–5.1)
Sodium: 137 mmol/L (ref 135–145)
Total Protein: 7.4 g/dL (ref 6.0–8.3)

## 2014-02-16 MED ORDER — SODIUM CHLORIDE 0.9 % IV BOLUS (SEPSIS)
500.0000 mL | Freq: Once | INTRAVENOUS | Status: AC
  Administered 2014-02-16: 500 mL via INTRAVENOUS

## 2014-02-16 NOTE — ED Provider Notes (Signed)
CSN: [id number]     Arrival date & time 02/16/14  2155 History   First MD Initiated Contact with Patient 02/16/14 2211     Chief Complaint  Patient presents with  . Diarrhea    (Consider location/radiation/quality/duration/timing/severity/associated sxs/prior Treatment) HPI Comments: 81 year old female with a history of diabetes mellitus, CVA, diverticulitis, hypertension, and hyperlipidemia. She presents to the emergency department today for further evaluation of diarrhea. Patient has had 3-4 episodes of diarrhea over the past 24 hours. She describes the diarrhea as brown and watery in color. Daughter reports that diarrhea smells fishy. Patient denies abdominal pain, but states that she does experience diffuse abdominal cramping when having a bowel movement. She also notes that she had an episode of diffuse weakness and lightheadedness when transitioning back to the bed from the bathroom. She states that she put her hand on the wall and lowered herself to the floor. She denies any falls. No medications taken prior to arrival for symptoms. Patient did have one episode of vomiting with EMS. She denies any nausea at present. No associated sick contacts. No recent abx use. No fever, chest pain, SOB, dysuria, hematuria, or dizziness. Patient was recently started on HCTZ.  Patient is a 81 y.o. female presenting with diarrhea. The history is provided by the patient and a relative. No language interpreter was used.  Diarrhea Associated symptoms: abdominal pain (only when stooling) and vomiting   Associated symptoms: no fever     Past Medical History  Diagnosis Date  . Diabetes mellitus 2007    HgA1C (02/20/2010) = 9.2, HgA1C (03/20/2009) = 12.1  . CVA (cerebrovascular accident) 6387     right embolic stroke, no residual deficits  . Diverticulitis   . CVA (cerebral infarction) 7-yrs ago  . Arthritis   . Dysphagia   . CHF (congestive heart failure)   . DIABETES MELLITUS, TYPE II 12/04/2005  .  HYPERLIPIDEMIA 12/04/2005  . HYPERTENSION 12/04/2005  . GERD 12/04/2005  . ARTHRITIS, KNEE 03/24/2006  . Lung mass 07/08/2010  . Meningioma 07/21/2010  . Hemangioma of liver 12/02/2010  . Depression 12/02/2010  . Adenocarcinoma of lung      Right upper lobe adenocarcinoma. s/p right lower lobectomy 12/15/10  . Brain cancer     7 cm cerebral right atypical meningoma grade II   Past Surgical History  Procedure Laterality Date  . Abdominal hysterectomy    . Video bronchoscope.  12/29/2007    [facility]  . Wide excision of left upper back mass.    . Extracapsular cataract extraction with intraocular      lens implantation.  . Right vats,right thoracotomy,right lower lobectomy with node dissection    . Incision and drainage perirectal abscess N/A 01/19/2013    Procedure: IRRIGATION AND DEBRIDEMENT PERIRECTAL ABSCESS;  Surgeon: Zenovia Jarred, MD;  Location: [facility];  Service: General;  Laterality: N/A;  . Craniotomy N/A 03/02/2013    Procedure: CRANIOTOMY TUMOR EXCISION;  Surgeon: Winfield Cunas, MD;  Location: [facility];  Service: Neurosurgery;  Laterality: N/A;  Bifrontal Craniotomy for tumor   Family History  Problem Relation Age of Onset  . Hyperlipidemia Brother   . Hypertension Brother   . Diabetes Brother    History  Substance Use Topics  . Smoking status: Former Smoker    Types: Cigarettes    Quit date: 02/17/2000  . Smokeless tobacco: Never Used  . Alcohol Use: No   OB History    No data available      Review  of Systems  Constitutional: Negative for fever.  Gastrointestinal: Positive for vomiting, abdominal pain (only when stooling) and diarrhea.  All other systems reviewed and are negative.   Allergies  Review of patient's allergies indicates no known allergies.  Home Medications   Prior to Admission medications   Medication Sig Start Date End Date Taking? Authorizing Provider  albuterol (PROVENTIL HFA;VENTOLIN HFA) 108 (90 BASE) MCG/ACT inhaler Inhale 2 puffs  into the lungs every 4 (four) hours as needed for wheezing. 01/03/13  Yes Wilber Oliphant, MD  aspirin 81 MG tablet Take 81 mg by mouth daily.    Yes Historical Provider, MD  carvedilol (COREG) 25 MG tablet Take 1 tablet (25 mg total) by mouth 2 (two) times daily with a meal. 01/03/13  Yes Wilber Oliphant, MD  FLUoxetine (PROZAC) 10 MG capsule Take 1 capsule (10 mg total) by mouth at bedtime. 12/11/13  Yes Wilber Oliphant, MD  guaiFENesin-dextromethorphan (ROBITUSSIN DM) 100-10 MG/5ML syrup Take 5 mLs by mouth every 4 (four) hours as needed for cough. 01/31/14  Yes Wilber Oliphant, MD  hydrochlorothiazide (HYDRODIURIL) 25 MG tablet Take 1 tablet (25 mg total) by mouth daily. 01/30/14  Yes Wilber Oliphant, MD  insulin aspart (NOVOLOG FLEXPEN) 100 UNIT/ML FlexPen Novolog Insulin: 91 - 160 5 units 161- 230 7 units 231-300 9 units 301- 370 11 units 371 - 440 13 units 441 - 510 15 units 02/01/14  Yes Wilber Oliphant, MD  Insulin Glargine (LANTUS SOLOSTAR) 100 UNIT/ML Solostar Pen Inject 17 Units into the skin at bedtime. 12/11/13  Yes Wilber Oliphant, MD  levETIRAcetam (KEPPRA) 750 MG tablet Take 1 tablet (750 mg total) by mouth 2 (two) times daily. 02/07/14  Yes Karren Cobble, MD  lisinopril (PRINIVIL,ZESTRIL) 40 MG tablet Take 1 tablet (40 mg total) by mouth daily. 12/26/13  Yes Wilber Oliphant, MD  omeprazole (PRILOSEC) 20 MG capsule Take 1 capsule (20 mg total) by mouth daily. 12/11/13  Yes Wilber Oliphant, MD  simvastatin (ZOCOR) 20 MG tablet Take 20 mg by mouth daily.   Yes Historical Provider, MD  acetaminophen-codeine (TYLENOL #3) 300-30 MG per tablet Take 1 tablet by mouth 2 (two) times daily as needed for moderate pain. Patient not taking: Reported on 02/16/2014 01/31/14   Wilber Oliphant, MD  albuterol (PROVENTIL) (2.5 MG/3ML) 0.083% nebulizer solution Take 3 mLs (2.5 mg total) by nebulization every 6 (six) hours as needed for wheezing. Patient not taking: Reported  on 01/30/2014 11/03/11   Ansel Bong, MD  FERREX 150 150 MG capsule TAKE 1 CAPSULE (150 MG TOTAL) BY MOUTH DAILY. Patient not taking: Reported on 02/16/2014 11/23/13   Wilber Oliphant, MD  glucose blood (ACCU-CHEK AVIVA) test strip Use to Check Blood Sugars 3 Times Daily.Dx Code: 250.00. Insulin dependent 11/09/13   Annia Belt, MD  senna-docusate (SENOKOT-S) 8.6-50 MG per tablet Take 1 tablet by mouth 2 (two) times daily. Patient not taking: Reported on 02/16/2014 10/08/13   Tasrif Ahmed, MD   BP 146/70 mmHg  Pulse 69  Temp(Src) 97.6 F (36.4 C) (Oral)  Resp 15  Ht 5\' 5"  (1.651 m)  Wt 210 lb (95.255 kg)  BMI 34.95 kg/m2  SpO2 100%   Physical Exam  Constitutional: She is oriented to person, place, and time. She appears well-developed and well-nourished. No distress.  Nontoxic/nonseptic appearing  HENT:  Head: Normocephalic and atraumatic.  Eyes: Conjunctivae and EOM are normal. No scleral icterus.  Neck: Normal range  of motion.  Cardiovascular: Normal rate, regular rhythm and intact distal pulses.   Pulmonary/Chest: Effort normal and breath sounds normal. No respiratory distress. She has no wheezes. She has no rales.  Respirations even and unlabored.  Abdominal: Soft. She exhibits no distension. There is no tenderness. There is no rebound and no guarding.  Soft obese abdomen without focal tenderness. No peritoneal signs  Musculoskeletal: Normal range of motion.  Neurological: She is alert and oriented to person, place, and time. She exhibits normal muscle tone. Coordination normal.  GCS 15. Patient moving all extremities.  Skin: Skin is warm and dry. No rash noted. She is not diaphoretic. No erythema. No pallor.  Psychiatric: She has a normal mood and affect. Her behavior is normal.  Nursing note and vitals reviewed.   ED Course  Procedures (including critical care time) Labs Review Labs Reviewed  CBC WITH DIFFERENTIAL - Abnormal; Notable for the following:    Hemoglobin  11.5 (*)    MCH 24.3 (*)    RDW 16.3 (*)    All other components within normal limits  COMPREHENSIVE METABOLIC PANEL - Abnormal; Notable for the following:    Glucose, Bld 177 (*)    BUN 36 (*)    Creatinine, Ser 1.47 (*)    GFR calc non Af Amer 33 (*)    GFR calc Af Amer 38 (*)    All other components within normal limits  CLOSTRIDIUM DIFFICILE BY PCR  GI PATHOGEN PANEL BY PCR, STOOL    Imaging Review No results found.   EKG Interpretation None      MDM   Final diagnoses:  Diarrhea  AKI (acute kidney injury)    81 year old female presents to the emergency department for further evaluation of diarrhea. She has also had one associated episode of emesis. No recent antibiotic use. No recent medication changes. No sick contacts or travel. Stool sent for culture which is currently pending. No abdominal pain or TTP. No indication for imaging at this time.   Patient reports having some diffuse weakness and lightheadedness this evening. Suspect this is secondary to dehydration. AKI with increased Cr and decreased GFR c/w dehydration. This is though to be secondary to increased stooling vs recently starting HCTZ. Internal medicine to admit for fluid hydration and monitoring.   Filed Vitals:   02/16/14 2315 02/16/14 2330 02/16/14 2345 02/17/14 0000  BP: 134/54 149/75 131/56 146/70  Pulse: 69 71 70 69  Temp:      TempSrc:      Resp: 20 16 14 15   Height:      Weight:      SpO2: 100% 100% 100% 100%     Antonietta Breach, PA-C 02/17/14 [place], MD 02/17/14 1053

## 2014-02-16 NOTE — ED Notes (Signed)
Pt and family reports onset of diarrhea that began at 1300 today.  Pt is also c/o hypotension and fall related to same.  Pt reports she got dizzy coming out of bathroom around 2045 and fell.  Pt has no complaints from fall.  Pt received 133mL fluids PTA and bp is WDL at this time.

## 2014-02-17 DIAGNOSIS — Z87898 Personal history of other specified conditions: Secondary | ICD-10-CM

## 2014-02-17 DIAGNOSIS — E119 Type 2 diabetes mellitus without complications: Secondary | ICD-10-CM | POA: Diagnosis not present

## 2014-02-17 DIAGNOSIS — I959 Hypotension, unspecified: Secondary | ICD-10-CM

## 2014-02-17 DIAGNOSIS — D509 Iron deficiency anemia, unspecified: Secondary | ICD-10-CM

## 2014-02-17 DIAGNOSIS — F329 Major depressive disorder, single episode, unspecified: Secondary | ICD-10-CM

## 2014-02-17 DIAGNOSIS — R8290 Unspecified abnormal findings in urine: Secondary | ICD-10-CM | POA: Diagnosis not present

## 2014-02-17 DIAGNOSIS — R197 Diarrhea, unspecified: Secondary | ICD-10-CM | POA: Diagnosis not present

## 2014-02-17 DIAGNOSIS — Z8673 Personal history of transient ischemic attack (TIA), and cerebral infarction without residual deficits: Secondary | ICD-10-CM | POA: Diagnosis not present

## 2014-02-17 DIAGNOSIS — I10 Essential (primary) hypertension: Secondary | ICD-10-CM

## 2014-02-17 DIAGNOSIS — N179 Acute kidney failure, unspecified: Secondary | ICD-10-CM | POA: Diagnosis not present

## 2014-02-17 DIAGNOSIS — R112 Nausea with vomiting, unspecified: Secondary | ICD-10-CM

## 2014-02-17 DIAGNOSIS — I951 Orthostatic hypotension: Secondary | ICD-10-CM | POA: Diagnosis present

## 2014-02-17 DIAGNOSIS — N390 Urinary tract infection, site not specified: Secondary | ICD-10-CM | POA: Diagnosis present

## 2014-02-17 DIAGNOSIS — K529 Noninfective gastroenteritis and colitis, unspecified: Secondary | ICD-10-CM | POA: Diagnosis present

## 2014-02-17 DIAGNOSIS — E785 Hyperlipidemia, unspecified: Secondary | ICD-10-CM

## 2014-02-17 DIAGNOSIS — K219 Gastro-esophageal reflux disease without esophagitis: Secondary | ICD-10-CM

## 2014-02-17 LAB — BASIC METABOLIC PANEL
ANION GAP: 10 (ref 5–15)
BUN: 35 mg/dL — AB (ref 6–23)
CO2: 20 mmol/L (ref 19–32)
Calcium: 8.5 mg/dL (ref 8.4–10.5)
Chloride: 107 mEq/L (ref 96–112)
Creatinine, Ser: 1.28 mg/dL — ABNORMAL HIGH (ref 0.50–1.10)
GFR calc Af Amer: 45 mL/min — ABNORMAL LOW (ref >90)
GFR calc non Af Amer: 39 mL/min — ABNORMAL LOW (ref >90)
GLUCOSE: 215 mg/dL — AB (ref 70–99)
POTASSIUM: 4.6 mmol/L (ref 3.5–5.1)
Sodium: 137 mmol/L (ref 135–145)

## 2014-02-17 LAB — CBC
HEMATOCRIT: 39 % (ref 36.0–46.0)
Hemoglobin: 11.8 g/dL — ABNORMAL LOW (ref 12.0–15.0)
MCH: 23.4 pg — AB (ref 26.0–34.0)
MCHC: 30.3 g/dL (ref 30.0–36.0)
MCV: 77.4 fL — ABNORMAL LOW (ref 78.0–100.0)
PLATELETS: 269 10*3/uL (ref 150–400)
RBC: 5.04 MIL/uL (ref 3.87–5.11)
RDW: 16.8 % — AB (ref 11.5–15.5)
WBC: 6.6 10*3/uL (ref 4.0–10.5)

## 2014-02-17 LAB — CREATININE, URINE, RANDOM: CREATININE, URINE: 125.05 mg/dL

## 2014-02-17 LAB — URINE MICROSCOPIC-ADD ON

## 2014-02-17 LAB — MRSA PCR SCREENING: MRSA by PCR: NEGATIVE

## 2014-02-17 LAB — URINALYSIS, ROUTINE W REFLEX MICROSCOPIC
Bilirubin Urine: NEGATIVE
GLUCOSE, UA: NEGATIVE mg/dL
HGB URINE DIPSTICK: NEGATIVE
Ketones, ur: NEGATIVE mg/dL
Nitrite: POSITIVE — AB
Protein, ur: NEGATIVE mg/dL
Specific Gravity, Urine: 1.018 (ref 1.005–1.030)
Urobilinogen, UA: 0.2 mg/dL (ref 0.0–1.0)
pH: 6.5 (ref 5.0–8.0)

## 2014-02-17 LAB — GLUCOSE, CAPILLARY
GLUCOSE-CAPILLARY: 88 mg/dL (ref 70–99)
Glucose-Capillary: 196 mg/dL — ABNORMAL HIGH (ref 70–99)
Glucose-Capillary: 165 mg/dL — ABNORMAL HIGH (ref 70–99)
Glucose-Capillary: 200 mg/dL — ABNORMAL HIGH (ref 70–99)
Glucose-Capillary: 134 mg/dL — ABNORMAL HIGH (ref 70–99)

## 2014-02-17 LAB — PHOSPHORUS: Phosphorus: 4.2 mg/dL (ref 2.3–4.6)

## 2014-02-17 LAB — CK: Total CK: 41 U/L (ref 7–177)

## 2014-02-17 LAB — CLOSTRIDIUM DIFFICILE BY PCR: Toxigenic C. Difficile by PCR: NEGATIVE

## 2014-02-17 LAB — MAGNESIUM: Magnesium: 2.1 mg/dL (ref 1.5–2.5)

## 2014-02-17 MED ORDER — ASPIRIN EC 81 MG PO TBEC
81.0000 mg | DELAYED_RELEASE_TABLET | Freq: Every day | ORAL | Status: DC
  Administered 2014-02-17 – 2014-02-18 (×2): 81 mg via ORAL
  Filled 2014-02-17 (×2): qty 1

## 2014-02-17 MED ORDER — SIMVASTATIN 20 MG PO TABS
20.0000 mg | ORAL_TABLET | Freq: Every day | ORAL | Status: DC
  Administered 2014-02-17 – 2014-02-18 (×2): 20 mg via ORAL
  Filled 2014-02-17 (×2): qty 1

## 2014-02-17 MED ORDER — ACETAMINOPHEN 650 MG RE SUPP: 650.0000 mg | Freq: Four times a day (QID) | RECTAL | Status: DC | PRN

## 2014-02-17 MED ORDER — SODIUM CHLORIDE 0.9 % IV SOLN
INTRAVENOUS | Status: AC
  Administered 2014-02-17: 06:00:00 via INTRAVENOUS

## 2014-02-17 MED ORDER — PANTOPRAZOLE SODIUM 40 MG PO TBEC
40.0000 mg | DELAYED_RELEASE_TABLET | Freq: Every day | ORAL | Status: DC
  Administered 2014-02-17 – 2014-02-18 (×2): 40 mg via ORAL
  Filled 2014-02-17: qty 1

## 2014-02-17 MED ORDER — FLUOXETINE HCL 10 MG PO CAPS
10.0000 mg | ORAL_CAPSULE | Freq: Every day | ORAL | Status: DC
  Filled 2014-02-17: qty 1

## 2014-02-17 MED ORDER — ALBUTEROL SULFATE (2.5 MG/3ML) 0.083% IN NEBU: 2.5000 mg | INHALATION_SOLUTION | Freq: Four times a day (QID) | RESPIRATORY_TRACT | Status: DC | PRN

## 2014-02-17 MED ORDER — INSULIN GLARGINE 100 UNIT/ML ~~LOC~~ SOLN
17.0000 [IU] | Freq: Every day | SUBCUTANEOUS | Status: DC
  Administered 2014-02-17 (×2): 17 [IU] via SUBCUTANEOUS
  Filled 2014-02-17 (×3): qty 0.17

## 2014-02-17 MED ORDER — LEVETIRACETAM 750 MG PO TABS
750.0000 mg | ORAL_TABLET | Freq: Two times a day (BID) | ORAL | Status: DC
  Administered 2014-02-17 – 2014-02-18 (×3): 750 mg via ORAL
  Filled 2014-02-17 (×4): qty 1

## 2014-02-17 MED ORDER — HEPARIN SODIUM (PORCINE) 5000 UNIT/ML IJ SOLN
5000.0000 [IU] | Freq: Three times a day (TID) | INTRAMUSCULAR | Status: DC
  Administered 2014-02-17 – 2014-02-18 (×4): 5000 [IU] via SUBCUTANEOUS
  Filled 2014-02-17 (×7): qty 1

## 2014-02-17 MED ORDER — SODIUM CHLORIDE 0.9 % IJ SOLN
3.0000 mL | Freq: Two times a day (BID) | INTRAMUSCULAR | Status: DC
  Administered 2014-02-17 – 2014-02-18 (×4): 3 mL via INTRAVENOUS

## 2014-02-17 MED ORDER — INSULIN ASPART 100 UNIT/ML ~~LOC~~ SOLN
0.0000 [IU] | Freq: Three times a day (TID) | SUBCUTANEOUS | Status: DC
  Administered 2014-02-17 – 2014-02-18 (×3): 4 [IU] via SUBCUTANEOUS

## 2014-02-17 MED ORDER — CEFTRIAXONE SODIUM IN DEXTROSE 20 MG/ML IV SOLN
1.0000 g | INTRAVENOUS | Status: DC
  Administered 2014-02-17: 1 g via INTRAVENOUS
  Filled 2014-02-17: qty 50

## 2014-02-17 MED ORDER — PANTOPRAZOLE SODIUM 40 MG PO TBEC: 40.0000 mg | DELAYED_RELEASE_TABLET | Freq: Once | ORAL | Status: DC

## 2014-02-17 MED ORDER — SENNOSIDES-DOCUSATE SODIUM 8.6-50 MG PO TABS
1.0000 | ORAL_TABLET | Freq: Two times a day (BID) | ORAL | Status: DC
  Filled 2014-02-17 (×2): qty 1

## 2014-02-17 MED ORDER — SODIUM CHLORIDE 0.9 % IV BOLUS (SEPSIS)
500.0000 mL | Freq: Once | INTRAVENOUS | Status: AC
  Administered 2014-02-17: 500 mL via INTRAVENOUS

## 2014-02-17 MED ORDER — LEVETIRACETAM 750 MG PO TABS: 750.0000 mg | ORAL_TABLET | Freq: Two times a day (BID) | ORAL | Status: DC

## 2014-02-17 MED ORDER — DM-GUAIFENESIN ER 30-600 MG PO TB12
1.0000 | ORAL_TABLET | Freq: Two times a day (BID) | ORAL | Status: DC | PRN
  Filled 2014-02-17: qty 1

## 2014-02-17 MED ORDER — METOCLOPRAMIDE HCL 5 MG/ML IJ SOLN
5.0000 mg | Freq: Four times a day (QID) | INTRAMUSCULAR | Status: DC | PRN
  Filled 2014-02-17: qty 1

## 2014-02-17 MED ORDER — ACETAMINOPHEN 325 MG PO TABS: 650.0000 mg | ORAL_TABLET | Freq: Four times a day (QID) | ORAL | Status: DC | PRN

## 2014-02-17 MED ORDER — SODIUM CHLORIDE 0.9 % IV BOLUS (SEPSIS)
1000.0000 mL | Freq: Once | INTRAVENOUS | Status: AC
  Administered 2014-02-17: 1000 mL via INTRAVENOUS

## 2014-02-17 MED ORDER — POLYSACCHARIDE IRON COMPLEX 150 MG PO CAPS
150.0000 mg | ORAL_CAPSULE | Freq: Every day | ORAL | Status: DC
  Administered 2014-02-17 – 2014-02-18 (×2): 150 mg via ORAL
  Filled 2014-02-17 (×2): qty 1

## 2014-02-17 NOTE — Progress Notes (Signed)
Pt arrived to floor in NAD, pt oriented to room and floor. VSS. NS bolus started per order. Pt OOB with 1 assist, no complaints of dizziness.

## 2014-02-17 NOTE — Progress Notes (Signed)
UR completed 

## 2014-02-17 NOTE — H&P (Signed)
Date: 02/17/2014               Patient Name:  [name] MRN: [id number]  DOB: 1933/12/09 Age / Sex: 81 y.o., female   PCP: Wilber Oliphant, MD              Medical Service: Internal Medicine Teaching Service              Attending Physician: Dr. Sid Falcon, MD    First Contact: Dr. Posey Pronto Pager: [phone]  Second Contact: Dr. Denton Brick Pager: [phone]            After Hours (After 5p/  First Contact Pager: [phone]  weekends / holidays): Second Contact Pager: [phone]   Chief Complaint:  Diarrhea  History of Present Illness: [name] is a 81 year old woman with history of DM2, CVA, diveticulitis, HTN, HLD, GERD, meningioma in 2012 and depression presenting with diarrhea that began at 1300 today.  She reports having 3 or 4 episodes of diarrhea since that time.  The diarrhea is light brown and watery, and her daughter reports that the diarrhea smells fishy.  She endorses some abdominal cramping with bowel movements, but she otherwise denies abdominal pain.  She reports that this evening she had an episode of lightheadedness and weakness causing her to fall against the bed and lower herself to the floor.  She denies hitting her head or losing consciousness.  She has an episode of vomiting with EMS, but she currently denies any nausea.  Her daughter also developed diarrhea at the same time and they both ate at [facility] yesterday.  She denies recent antibiotic use, fever, chest pain, shortness of breath, dysuria, or blood in her stool.  She was recently seen in clinic on 02/01/14, and she was noted to have a blood pressure elevated to 179/83.  She was switched from Lasix 20 mg daily to HCTZ 25 mg daily.  Since that time, her blood pressure has been running from the 229N to 989Q systolic.  Review of Systems: Review of Systems  Constitutional: Positive for malaise/fatigue and diaphoresis. Negative for fever and chills.  HENT: Negative for congestion and sore throat.   Eyes:  Negative for blurred vision.  Respiratory: Negative for cough, shortness of breath and wheezing.   Cardiovascular: Negative for chest pain, orthopnea and leg swelling.  Gastrointestinal: Positive for nausea, vomiting, abdominal pain and diarrhea. Negative for constipation, blood in stool and melena.  Genitourinary: Negative for dysuria.  Musculoskeletal: Positive for joint pain (Chronic knee pain.). Negative for myalgias.  Skin: Negative for rash.  Neurological: Positive for dizziness and weakness. Negative for sensory change, focal weakness and headaches.    Meds:  (Not in a hospital admission) Current Facility-Administered Medications  Medication Dose Route Frequency Provider Last Rate Last Dose  . 0.9 %  sodium chloride infusion   Intravenous Continuous Marjan Rabbani, MD      . acetaminophen (TYLENOL) tablet 650 mg  650 mg Oral Q6H PRN Juluis Mire, MD       Or  . acetaminophen (TYLENOL) suppository 650 mg  650 mg Rectal Q6H PRN Marjan Rabbani, MD      . albuterol (PROVENTIL) (2.5 MG/3ML) 0.083% nebulizer solution 2.5 mg  2.5 mg Nebulization Q6H PRN Marjan Rabbani, MD      . aspirin tablet 81 mg  81 mg Oral Daily Marjan Rabbani, MD      . Derrill Memo ON 02/18/2014] FLUoxetine (PROZAC) capsule 10 mg  10 mg Oral  QHS Marjan Rabbani, MD      . heparin injection 5,000 Units  5,000 Units Subcutaneous 3 times per day Juluis Mire, MD      . insulin glargine (LANTUS) injection 17 Units  17 Units Subcutaneous QHS Marjan Rabbani, MD      . iron polysaccharides (NIFEREX) capsule 150 mg  150 mg Oral Daily Marjan Rabbani, MD      . levETIRAcetam (KEPPRA) tablet 750 mg  750 mg Oral BID Marjan Rabbani, MD      . pantoprazole (PROTONIX) EC tablet 40 mg  40 mg Oral Daily Marjan Rabbani, MD      . Derrill Memo ON 02/18/2014] senna-docusate (Senokot-S) tablet 1 tablet  1 tablet Oral BID Marjan Rabbani, MD      . simvastatin (ZOCOR) tablet 20 mg  20 mg Oral Daily Marjan Rabbani, MD      . sodium chloride 0.9 %  bolus 1,000 mL  1,000 mL Intravenous Once Marjan Rabbani, MD      . sodium chloride 0.9 % injection 3 mL  3 mL Intravenous Q12H Juluis Mire, MD       Current Outpatient Prescriptions  Medication Sig Dispense Refill  . albuterol (PROVENTIL HFA;VENTOLIN HFA) 108 (90 BASE) MCG/ACT inhaler Inhale 2 puffs into the lungs every 4 (four) hours as needed for wheezing. 3 Inhaler 1  . aspirin 81 MG tablet Take 81 mg by mouth daily.     . carvedilol (COREG) 25 MG tablet Take 1 tablet (25 mg total) by mouth 2 (two) times daily with a meal. 180 tablet 4  . FLUoxetine (PROZAC) 10 MG capsule Take 1 capsule (10 mg total) by mouth at bedtime. 30 capsule 3  . guaiFENesin-dextromethorphan (ROBITUSSIN DM) 100-10 MG/5ML syrup Take 5 mLs by mouth every 4 (four) hours as needed for cough. 118 mL 0  . hydrochlorothiazide (HYDRODIURIL) 25 MG tablet Take 1 tablet (25 mg total) by mouth daily. 30 tablet 1  . insulin aspart (NOVOLOG FLEXPEN) 100 UNIT/ML FlexPen Novolog Insulin: 91 - 160 5 units 161- 230 7 units 231-300 9 units 301- 370 11 units 371 - 440 13 units 441 - 510 15 units 15 mL 11  . Insulin Glargine (LANTUS SOLOSTAR) 100 UNIT/ML Solostar Pen Inject 17 Units into the skin at bedtime. 10 mL 5  . levETIRAcetam (KEPPRA) 750 MG tablet Take 1 tablet (750 mg total) by mouth 2 (two) times daily. 60 tablet 11  . lisinopril (PRINIVIL,ZESTRIL) 40 MG tablet Take 1 tablet (40 mg total) by mouth daily. 30 tablet 3  . omeprazole (PRILOSEC) 20 MG capsule Take 1 capsule (20 mg total) by mouth daily. 30 capsule 3  . simvastatin (ZOCOR) 20 MG tablet Take 20 mg by mouth daily.    Marland Kitchen acetaminophen-codeine (TYLENOL #3) 300-30 MG per tablet Take 1 tablet by mouth 2 (two) times daily as needed for moderate pain. (Patient not taking: Reported on 02/16/2014) 60 tablet 0  . albuterol (PROVENTIL) (2.5 MG/3ML) 0.083% nebulizer solution Take 3 mLs (2.5 mg total) by nebulization every 6 (six) hours as needed for wheezing.  (Patient not taking: Reported on 01/30/2014) 75 mL 12  . FERREX 150 150 MG capsule TAKE 1 CAPSULE (150 MG TOTAL) BY MOUTH DAILY. (Patient not taking: Reported on 02/16/2014) 30 capsule 1  . glucose blood (ACCU-CHEK AVIVA) test strip Use to Check Blood Sugars 3 Times Daily.Dx Code: 250.00. Insulin dependent 100 each 12  . senna-docusate (SENOKOT-S) 8.6-50 MG per tablet Take 1 tablet by mouth 2 (two) times  daily. (Patient not taking: Reported on 02/16/2014) 30 tablet 2    Allergies: Allergies as of 02/16/2014  . (No Known Allergies)   Past Medical History  Diagnosis Date  . Diabetes mellitus 2007    HgA1C (02/20/2010) = 9.2, HgA1C (03/20/2009) = 12.1  . CVA (cerebrovascular accident) 4401     right embolic stroke, no residual deficits  . Diverticulitis   . CVA (cerebral infarction) 7-yrs ago  . Arthritis   . Dysphagia   . CHF (congestive heart failure)   . DIABETES MELLITUS, TYPE II 12/04/2005  . HYPERLIPIDEMIA 12/04/2005  . HYPERTENSION 12/04/2005  . GERD 12/04/2005  . ARTHRITIS, KNEE 03/24/2006  . Lung mass 07/08/2010  . Meningioma 07/21/2010  . Hemangioma of liver 12/02/2010  . Depression 12/02/2010  . Adenocarcinoma of lung      Right upper lobe adenocarcinoma. s/p right lower lobectomy 12/15/10  . Brain cancer     7 cm cerebral right atypical meningoma grade II   Past Surgical History  Procedure Laterality Date  . Abdominal hysterectomy    . Video bronchoscope.  12/29/2007    [place]  . Wide excision of left upper back mass.    . Extracapsular cataract extraction with intraocular      lens implantation.  . Right vats,right thoracotomy,right lower lobectomy with node dissection    . Incision and drainage perirectal abscess N/A 01/19/2013    Procedure: IRRIGATION AND DEBRIDEMENT PERIRECTAL ABSCESS;  Surgeon: Zenovia Jarred, MD;  Location: [place];  Service: General;  Laterality: N/A;  . Craniotomy N/A 03/02/2013    Procedure: CRANIOTOMY TUMOR EXCISION;  Surgeon: Winfield Cunas,  MD;  Location: [facility];  Service: Neurosurgery;  Laterality: N/A;  Bifrontal Craniotomy for tumor   Family History  Problem Relation Age of Onset  . Hyperlipidemia Brother   . Hypertension Brother   . Diabetes Brother    History   Social History  . Marital Status: Widowed    Spouse Name: N/A    Number of Children: N/A  . Years of Education: 10   Occupational History  .  Unemployed   Social History Main Topics  . Smoking status: Former Smoker    Types: Cigarettes    Quit date: 02/17/2000  . Smokeless tobacco: Never Used  . Alcohol Use: No  . Drug Use: No  . Sexual Activity: Not Currently   Other Topics Concern  . Not on file   Social History Narrative   Patient requests to use [employer] [facility] for her diabets testing supplies as of 09/05/2009.    Physical Exam: Filed Vitals:   02/17/14 0000  BP: 146/70  Pulse: 69  Temp:   Resp: 15   Physical Exam  Constitutional: She is oriented to person, place, and time and well-developed, well-nourished, and in no distress. No distress.  Drowsy, just received Keppra.  HENT:  Head: Normocephalic and atraumatic.  Dry mucous membranes.  Eyes: Conjunctivae and EOM are normal. Pupils are equal, round, and reactive to light. No scleral icterus.  Cardiovascular: Normal rate, regular rhythm and normal heart sounds.   Pulmonary/Chest: Effort normal and breath sounds normal. No respiratory distress.  Abdominal: Soft. Bowel sounds are normal. She exhibits no distension. There is no tenderness.  Musculoskeletal: Normal range of motion. She exhibits no edema or tenderness.  Neurological: She is alert and oriented to person, place, and time. No cranial nerve deficit. She exhibits normal muscle tone.  Skin: Skin is warm and dry. No rash noted. She  is not diaphoretic. No erythema.  Cyst on left breast previously drained.    Lab results: Basic Metabolic Panel:  Recent Labs  02/16/14 2309  NA 137  K 4.4  CL 105  CO2 21    GLUCOSE 177*  BUN 36*  CREATININE 1.47*  CALCIUM 9.1   Liver Function Tests:  Recent Labs  02/16/14 2309  AST 16  ALT 12  ALKPHOS 67  BILITOT 0.4  PROT 7.4  ALBUMIN 3.6   CBC:  Recent Labs  02/16/14 2309  WBC 7.5  NEUTROABS 5.0  HGB 11.5*  HCT 37.0  MCV 78.1  PLT 236   Urine Drug Screen: Drugs of Abuse     Component Value Date/Time   LABOPIA NONE DETECTED 10/06/2013 0136   LABOPIA PPS 06/27/2012 1645   COCAINSCRNUR NONE DETECTED 10/06/2013 0136   COCAINSCRNUR NEG 06/27/2012 1645   LABBENZ NONE DETECTED 10/06/2013 0136   LABBENZ PPS 06/27/2012 1645   LABBENZ NEG 07/02/2006 2021   AMPHETMU NONE DETECTED 10/06/2013 0136   AMPHETMU NEG 06/27/2012 1645   AMPHETMU NEG 07/02/2006 2021   THCU NONE DETECTED 10/06/2013 0136   THCU NEG 06/27/2012 1645   LABBARB NONE DETECTED 10/06/2013 0136   LABBARB NEG 06/27/2012 1645     Assessment & Plan by Problem: Active Problems:   AKI (acute kidney injury)   #Diarrhea, nausea, vomiting Likely viral with sick contact, but foul smelling concerning for infectious diarrhea.  Will rehydrate and check stool panel.  Received 500 ml NS in the ER.  EF from 04/09/13 with normal EF and grade I diastolic dysfunction.  QTc was elevated in the past at 504. -Admit to med-surg. -1L NS bolus then NS at 125 ml/hr. -GI pathogen panel -Cdiff toxin. -Enteric precautions. -Clear liquid diet. -Reglan PRN nausea.  #Hypotension with AKI Cr up to 1.47 from baseline 0.89 with elevated BUN.  Blood pressure has been running low since change in regimen from Lasix to HCTZ.  Now likely dehydrated with diarrhea leading to AKI.  Will rehydrate and monitor kidney function. -Trend BMP. -NS as above. -Check urinalysis, FEUrea. -Check orthostatics.  #Hypertension Normotensive in the ER. -Hold home Coreg 25 mg BID, HCTZ 25 mg daily, lisinopril 40 mg daily in the setting of AKI.  #DM2 Last A1C 7.6 on 12/26/13. -Continue home Lantus 17 units  QHS. -SSI resistant. -CBG ACHS.  #CVA  Right embolic stroke in 2751 with no residual defects. -Continue home aspirin 81 mg.  #Iron deficiency anemia Hgb 11.5 up from baseline 10 due to hemoconcentration.  Last anemia panel on 04/04/13 with ferritin 208 on iron supplementation.  MCV 78.1 consistent with iron-deficiency today. -Continue home Ferrex.  #HLD -Continue home statin.  #Depression -Continue home Prozac 10 mg QHS.  #Hx of meningioma with seizures -Continue home Keppra 750 mg BID.  #GERD -Continue home PPI.  Dispo: Disposition is deferred at this time, awaiting improvement of current medical problems. Anticipated discharge in approximately 1-2 day(s).   The patient does have a current PCP Wilber Oliphant, MD), therefore will be require OPC follow-up after discharge.   The patient does have transportation limitations that hinder transportation to clinic appointments.   Signed:  Arman Filter, MD, PhD PGY-1 Internal Medicine Teaching Service Pager: [phone] 02/17/2014, 2:10 AM

## 2014-02-17 NOTE — ED Notes (Signed)
Attempted to call report to the floor, nurse was unavailable at this time but will call back.

## 2014-02-17 NOTE — Progress Notes (Signed)
Subjective: Pt lying in bed. Family not present at bedside. Patient answers most questions, appears sleepy. Denies lower abdominal pain and denies dysuria. Pt did not respond to questions about ongoing diarrhea.   Objective: Vital signs in last 24 hours: Filed Vitals:   02/17/14 0000 02/17/14 0200 02/17/14 0312 02/17/14 0600  BP: 146/70 146/67  149/53  Pulse: 69 64 63 68  Temp:   97.5 F (36.4 C) 97.8 F (36.6 C)  TempSrc:   Oral Oral  Resp: 15 16 20 18   Height:   5\' 4"  (1.626 m)   Weight:   202 lb 9.6 oz (91.9 kg)   SpO2: 100% 99% 100% 100%   Weight change:   Intake/Output Summary (Last 24 hours) at 02/17/14 1221 Last data filed at 02/17/14 1100  Gross per 24 hour  Intake 1906.67 ml  Output    750 ml  Net 1156.67 ml   General appearance: cooperative and no distress Head: Normocephalic, without obvious abnormality, atraumatic Eyes: conjunctivae/corneas clear. PERRL, EOM's intact. Fundi benign. Lungs: No added sounds Heart: regular, no added sounds.  Abdomen: soft, non-tender; bowel sounds normal; no masses,  no organomegaly Extremities: extremities normal, atraumatic, no cyanosis or edema Lab Results: Basic Metabolic Panel:  Recent Labs Lab 02/16/14 2309 02/17/14 0403  NA 137 137  K 4.4 4.6  CL 105 107  CO2 21 20  GLUCOSE 177* 215*  BUN 36* 35*  CREATININE 1.47* 1.28*  CALCIUM 9.1 8.5  MG  --  2.1  PHOS  --  4.2   Liver Function Tests:  Recent Labs Lab 02/16/14 2309  AST 16  ALT 12  ALKPHOS 67  BILITOT 0.4  PROT 7.4  ALBUMIN 3.6   CBC:  Recent Labs Lab 02/16/14 2309 02/17/14 0403  WBC 7.5 6.6  NEUTROABS 5.0  --   HGB 11.5* 11.8*  HCT 37.0 39.0  MCV 78.1 77.4*  PLT 236 269   Cardiac Enzymes:  Recent Labs Lab 02/17/14 0403  CKTOTAL 41   CBG:  Recent Labs Lab 02/17/14 0321 02/17/14 0543 02/17/14 1120  GLUCAP 196* 165* 200*   Urine Drug Screen: Drugs of Abuse     Component Value Date/Time   LABOPIA NONE DETECTED  10/06/2013 0136   LABOPIA PPS 06/27/2012 1645   COCAINSCRNUR NONE DETECTED 10/06/2013 0136   COCAINSCRNUR NEG 06/27/2012 1645   LABBENZ NONE DETECTED 10/06/2013 0136   LABBENZ PPS 06/27/2012 1645   LABBENZ NEG 07/02/2006 2021   AMPHETMU NONE DETECTED 10/06/2013 0136   AMPHETMU NEG 06/27/2012 1645   AMPHETMU NEG 07/02/2006 2021   THCU NONE DETECTED 10/06/2013 0136   THCU NEG 06/27/2012 1645   LABBARB NONE DETECTED 10/06/2013 0136   LABBARB NEG 06/27/2012 1645    Alcohol Level: No results for input(s): ETH in the last 168 hours. Urinalysis:  Recent Labs Lab 02/17/14 0354  COLORURINE YELLOW  LABSPEC 1.018  PHURINE 6.5  GLUCOSEU NEGATIVE  HGBUR NEGATIVE  BILIRUBINUR NEGATIVE  KETONESUR NEGATIVE  PROTEINUR NEGATIVE  UROBILINOGEN 0.2  NITRITE POSITIVE*  LEUKOCYTESUR LARGE*   Micro Results: Recent Results (from the past 240 hour(s))  Clostridium Difficile by PCR     Status: None   Collection Time: 02/16/14 10:32 PM  Result Value Ref Range Status   C difficile by pcr NEGATIVE NEGATIVE Final  MRSA PCR Screening     Status: None   Collection Time: 02/17/14  3:54 AM  Result Value Ref Range Status   MRSA by PCR NEGATIVE NEGATIVE Final  Comment:        The GeneXpert MRSA Assay (FDA approved for NASAL specimens only), is one component of a comprehensive MRSA colonization surveillance program. It is not intended to diagnose MRSA infection nor to guide or monitor treatment for MRSA infections.    Medications: I have reviewed the patient's current medications. Scheduled Meds: . aspirin EC  81 mg Oral Daily  . [START ON 02/18/2014] FLUoxetine  10 mg Oral QHS  . heparin  5,000 Units Subcutaneous 3 times per day  . insulin aspart  0-20 Units Subcutaneous TID WC  . insulin glargine  17 Units Subcutaneous QHS  . iron polysaccharides  150 mg Oral Daily  . levETIRAcetam  750 mg Oral BID  . pantoprazole  40 mg Oral Daily  . [START ON 02/18/2014] senna-docusate  1 tablet Oral  BID  . simvastatin  20 mg Oral Daily  . sodium chloride  3 mL Intravenous Q12H   Continuous Infusions: . sodium chloride 125 mL/hr at 02/17/14 0540   PRN Meds:.acetaminophen **OR** acetaminophen, albuterol, dextromethorphan-guaiFENesin, metoCLOPramide (REGLAN) injection Assessment/Plan:  #Diarrhea, nausea, vomiting Likely viral with sick contact, but foul smelling concerning for infectious diarrhea.  EF from 04/09/13 with normal EF and grade I diastolic dysfunction. QTc was elevated in the past at 504. C. Diff- neg.  -Admit to med-surg. -1L NS bolus then NS at 125 ml/hr, will reduce to 75cc/hr for 12 more hours.  -GI pathogen panel- pending.  -Cdiff toxin. -Enteric precautions. -Clear liquid diet, advance as tolerated.  -Reglan PRN nausea.  #Hypotension with AKI Cr up to 1.47 from baseline 0.89 with elevated BUN. Blood pressure has been running low since change in regimen from Lasix to HCTZ. Now likely dehydrated with diarrhea leading to AKI. Will rehydrate and monitor kidney function. -Trend BMP. -NS now at 75cc/hr -Check orthostatics. - Bmet in the Am - UA with Many bact and Large nitrite. Also noted that pt has foul/strong smelling urine, which is not new. Pt dehydrated, likely contributing to odour. Will stop Antibiotics rocephin for now, as pt denies any symptoms. Do not want to induce C. Diff, as pt is presently having diarrhea.   #Hypertension- Bp Stable- 149/53, was hypotensive previously.  Normotensive in the ER. -Hold home Coreg 25 mg BID, HCTZ 25 mg daily, lisinopril 40 mg daily in the setting of AKI.  #DM2 Last A1C 7.6 on 12/26/13. -Continue home Lantus 17 units QHS. -SSI resistant. -CBG ACHS.  #CVA  Right embolic stroke in 7169 with no residual defects. -Continue home aspirin 81 mg.  #Iron deficiency anemia Hgb 11.5 up from baseline 10 due to hemoconcentration. Last anemia panel on 04/04/13 with ferritin 208 on iron supplementation. MCV 78.1 consistent  with iron-deficiency today. -Continue home Ferrex.  #HLD -Continue home statin.  #Depression -Continue home Prozac 10 mg QHS.  #Hx of meningioma with seizures -Continue home Keppra 750 mg BID.  #GERD -Continue home PPI.  Dispo: Disposition is deferred at this time, awaiting improvement of current medical problems.  Anticipated discharge in approximately tomorrow.    The patient does have a current PCP Wilber Oliphant, MD) and does need an [facility] hospital follow-up appointment after discharge.  The patient does not have transportation limitations that hinder transportation to clinic appointments.  .Services Needed at time of discharge: Y = Yes, Blank = No PT:   OT:   RN:   Equipment:   Other:     LOS: 1 day   Bethena Roys, MD 02/17/2014, 12:21 PM

## 2014-02-17 NOTE — ED Provider Notes (Signed)
  Face-to-face evaluation   History: She is here for evaluation of general weakness, "swimming headedness", and multiple episodes of diarrhea, since yesterday.  Physical exam: Elderly female.  She is a poor historian.  Mucous membranes are slightly dry.  Medical screening examination/treatment/procedure(s) were conducted as a shared visit with non-physician practitioner(s) and myself.  I personally evaluated the patient during the encounter  Richarda Blade, MD 02/17/14 1054

## 2014-02-18 DIAGNOSIS — E119 Type 2 diabetes mellitus without complications: Secondary | ICD-10-CM | POA: Diagnosis not present

## 2014-02-18 DIAGNOSIS — A084 Viral intestinal infection, unspecified: Secondary | ICD-10-CM | POA: Diagnosis not present

## 2014-02-18 DIAGNOSIS — I10 Essential (primary) hypertension: Secondary | ICD-10-CM | POA: Diagnosis not present

## 2014-02-18 DIAGNOSIS — D509 Iron deficiency anemia, unspecified: Secondary | ICD-10-CM | POA: Diagnosis not present

## 2014-02-18 DIAGNOSIS — I959 Hypotension, unspecified: Secondary | ICD-10-CM | POA: Diagnosis not present

## 2014-02-18 DIAGNOSIS — N179 Acute kidney failure, unspecified: Secondary | ICD-10-CM | POA: Diagnosis not present

## 2014-02-18 DIAGNOSIS — E785 Hyperlipidemia, unspecified: Secondary | ICD-10-CM | POA: Diagnosis not present

## 2014-02-18 DIAGNOSIS — Z8673 Personal history of transient ischemic attack (TIA), and cerebral infarction without residual deficits: Secondary | ICD-10-CM | POA: Diagnosis not present

## 2014-02-18 LAB — UREA NITROGEN, URINE: Urea Nitrogen, Ur: 735 mg/dL

## 2014-02-18 LAB — GLUCOSE, CAPILLARY
GLUCOSE-CAPILLARY: 109 mg/dL — AB (ref 70–99)
Glucose-Capillary: 169 mg/dL — ABNORMAL HIGH (ref 70–99)

## 2014-02-18 LAB — BASIC METABOLIC PANEL
ANION GAP: 5 (ref 5–15)
BUN: 20 mg/dL (ref 6–23)
CALCIUM: 8.5 mg/dL (ref 8.4–10.5)
CO2: 21 mmol/L (ref 19–32)
Chloride: 112 mEq/L (ref 96–112)
Creatinine, Ser: 1.08 mg/dL (ref 0.50–1.10)
GFR, EST AFRICAN AMERICAN: 55 mL/min — AB (ref >90)
GFR, EST NON AFRICAN AMERICAN: 48 mL/min — AB (ref >90)
Glucose, Bld: 88 mg/dL (ref 70–99)
Potassium: 5.2 mmol/L — ABNORMAL HIGH (ref 3.5–5.1)
SODIUM: 138 mmol/L (ref 135–145)

## 2014-02-18 NOTE — Discharge Instructions (Signed)
Thank you for trusting [place] with your medical care!  You were hospitalized for viral gastroenteritis and treated with IV fluids.   Please take note of the following changes to your medications: -STOP taking lisinopril & hydrochlorothiazide until you make it your follow-up appointment. -[place] for your blood pressure  Please follow-up in clinic this week for bloodwork so we can restart your lisinopril & hydrochlorothiazide.

## 2014-02-18 NOTE — Discharge Summary (Signed)
Name: [name] MRN: [id number] DOB: 06-12-1933 81 y.o. PCP: Wilber Oliphant, MD  Date of Admission: 02/16/2014  9:55 PM Date of Discharge: 02/18/2014 Attending Physician: Gilles Chiquito, MD  Discharge Diagnosis: Principal Problem:   AKI (acute kidney injury) Active Problems:   Diabetes mellitus with mild nonproliferative diabetic retinopathy   Hyperlipemia   Essential hypertension   GERD   Chronic diastolic congestive heart failure   Depression   History of pulmonary embolism   Iron deficiency anemia   Seizure disorder   History of prolonged Q-T interval on ECG   Orthostatic hypotension   Gastroenteritis   UTI (urinary tract infection)   Diarrhea  Discharge Medications:   Medication List    STOP taking these medications        hydrochlorothiazide 25 MG tablet  Commonly known as:  HYDRODIURIL     lisinopril 40 MG tablet  Commonly known as:  PRINIVIL,ZESTRIL     senna-docusate 8.6-50 MG per tablet  Commonly known as:  Senokot-S      TAKE these medications        acetaminophen-codeine 300-30 MG per tablet  Commonly known as:  TYLENOL #3  Take 1 tablet by mouth 2 (two) times daily as needed for moderate pain.     albuterol (2.5 MG/3ML) 0.083% nebulizer solution  Commonly known as:  PROVENTIL  Take 3 mLs (2.5 mg total) by nebulization every 6 (six) hours as needed for wheezing.     albuterol 108 (90 BASE) MCG/ACT inhaler  Commonly known as:  PROVENTIL HFA;VENTOLIN HFA  Inhale 2 puffs into the lungs every 4 (four) hours as needed for wheezing.     aspirin 81 MG tablet  Take 81 mg by mouth daily.     carvedilol 25 MG tablet  Commonly known as:  COREG  Take 1 tablet (25 mg total) by mouth 2 (two) times daily with a meal.     FERREX 150 150 MG capsule  Generic drug:  iron polysaccharides  TAKE 1 CAPSULE (150 MG TOTAL) BY MOUTH DAILY.     FLUoxetine 10 MG capsule  Commonly known as:  PROZAC  Take 1 capsule (10 mg total) by mouth at bedtime.      glucose blood test strip  Commonly known as:  ACCU-CHEK AVIVA  Use to Check Blood Sugars 3 Times Daily.Dx Code: 250.00. Insulin dependent     guaiFENesin-dextromethorphan 100-10 MG/5ML syrup  Commonly known as:  ROBITUSSIN DM  Take 5 mLs by mouth every 4 (four) hours as needed for cough.     insulin aspart 100 UNIT/ML FlexPen  Commonly known as:  NOVOLOG FLEXPEN  - Novolog Insulin:  - 91 - 160 5 units  - 161- 230 7 units  - 231-300 9 units  - 301- 370 11 units  - 371 - 440 13 units  - 441 - 510 15 units     Insulin Glargine 100 UNIT/ML Solostar Pen  Commonly known as:  LANTUS SOLOSTAR  Inject 17 Units into the skin at bedtime.     levETIRAcetam 750 MG tablet  Commonly known as:  KEPPRA  Take 1 tablet (750 mg total) by mouth 2 (two) times daily.     omeprazole 20 MG capsule  Commonly known as:  PRILOSEC  Take 1 capsule (20 mg total) by mouth daily.     simvastatin 20 MG tablet  Commonly known as:  ZOCOR  Take 20 mg by mouth daily.        Disposition  and follow-up:   [name] was discharged from [facility] in Stable condition.  At the hospital follow up visit please address:  1.  AKI: resumption of home Lasix, lisinopril  2.  Possible UTI: assess if she has had symptoms  3.  Labs / imaging needed at time of follow-up: BMET  4.  Pending labs/ test needing follow-up: none  Follow-up Appointments:     Follow-up Information    Follow up with Jerene Pitch, MD. Schedule an appointment as soon as possible for a visit in 1 week.   Specialty:  Internal Medicine   Contact information:   [place] [address] [phone]       Discharge Instructions: Discharge Instructions    Diet - low sodium heart healthy    Complete by:  As directed      Discharge instructions    Complete by:  As directed   We have made some temporary adjustments to your medications- especially your blood pressure medications.  This is because you were really dehydrated and your blood pressure was low.   1- Continue with your Coreg exactly the way you were taking it. Take 25mg  two time a day. 2- STOP your HCTZ and Lisinopril for now. These medications can be restarted later when you follow up in clinic.  We will like you to follow up in clinic this week. You will be called with an appointment this week. You blood pressure medications will then be restarted if appropriate. Please check your blood pressure everyday and write it down and bring it to clinic.  Also do not take you stool softners for now, as you have been having diarrhea. Stay hydrated.     Increase activity slowly    Complete by:  As directed            Admission HPI: [name] is a 81 year old woman with history of DM2, CVA, diveticulitis, HTN, HLD, GERD, meningioma in 2012 and depression presenting with diarrhea that began at 1300 today. She reports having 3 or 4 episodes of diarrhea since that time. The diarrhea is light brown and watery, and her daughter reports that the diarrhea smells fishy. She endorses some abdominal cramping with bowel movements, but she otherwise denies abdominal pain. She reports that this evening she had an episode of lightheadedness and weakness causing her to fall against the bed and lower herself to the floor. She denies hitting her head or losing consciousness. She has an episode of vomiting with EMS, but she currently denies any nausea. Her daughter also developed diarrhea at the same time and they both ate at [facility] yesterday. She denies recent antibiotic use, fever, chest pain, shortness of breath, dysuria, or blood in her stool. She was recently seen in clinic on 02/01/14, and she was noted to have a blood pressure elevated to 179/83. She was switched from Lasix 20 mg daily to HCTZ 25 mg daily. Since that time, her blood pressure has been running from the 191Y to 782N systolic  Hospital Course by problem  list:   #Viral gastroenteritis: Likely given sick contact with common food source ([employer]). Her symptoms resolved by the time of discharge and she was tolerating PO intake. She tested negative for C. Dif.     #Hypotension with AKI: Crt trended down to 1.08 on the day of discharge but was not back to her baseline. She was advised to increase her PO intake while holding HCTZ &  lisinopril until her kidney function recovered. Please reassess at the time of follow-up.  #Hypertension: BP trended 140-160. Given AKI as noted above, she will be restarted on home Coreg 25 mg BID at the time of discharge.  #Asymptomatic bacteruria: UA on admission with many bacteria & WBCs though she was asymptomatic. Urine culture grew Klebsiella & E.coli following discharge. Presence of symptoms should be reassessed at follow-up.  #DM2: CBGs trended mostly in 100s on home Lantus and SSI-R.  #H/o CVA: Stable on home aspirin 81 mg.  #Iron deficiency anemia: Hb trended 11, up from baseline 10 and was continued on home Ferrex.  #HLD: Remained stable on home medications.  #Depression: Remained stable on home medications.  #Hx of meningioma with seizures: Remained stable on home medications.  #GERD: Remained stable on home medications.   Discharge Vitals:   BP 156/62 mmHg  Pulse 78  Temp(Src) 98 F (36.7 C) (Oral)  Resp 18  Ht 5\' 4"  (1.626 m)  Wt 211 lb 3.2 oz (95.8 kg)  BMI 36.23 kg/m2  SpO2 99%  Discharge Labs:  Results for orders placed or performed during the hospital encounter of 02/16/14 (from the past 24 hour(s))  Glucose, capillary     Status: None   Collection Time: 02/17/14  4:51 PM  Result Value Ref Range   Glucose-Capillary 88 70 - 99 mg/dL   Comment 1 Documented in Chart    Comment 2 Notify RN   Glucose, capillary     Status: Abnormal   Collection Time: 02/17/14  8:46 PM  Result Value Ref Range   Glucose-Capillary 134 (H) 70 - 99 mg/dL   Comment 1 Documented in Chart    Comment 2  Notify RN   Basic metabolic panel     Status: Abnormal   Collection Time: 02/18/14  3:45 AM  Result Value Ref Range   Sodium 138 135 - 145 mmol/L   Potassium 5.2 (H) 3.5 - 5.1 mmol/L   Chloride 112 96 - 112 mEq/L   CO2 21 19 - 32 mmol/L   Glucose, Bld 88 70 - 99 mg/dL   BUN 20 6 - 23 mg/dL   Creatinine, Ser 1.08 0.50 - 1.10 mg/dL   Calcium 8.5 8.4 - 10.5 mg/dL   GFR calc non Af Amer 48 (L) >90 mL/min   GFR calc Af Amer 55 (L) >90 mL/min   Anion gap 5 5 - 15  Glucose, capillary     Status: Abnormal   Collection Time: 02/18/14  6:27 AM  Result Value Ref Range   Glucose-Capillary 109 (H) 70 - 99 mg/dL   Comment 1 Documented in Chart    Comment 2 Notify RN   Glucose, capillary     Status: Abnormal   Collection Time: 02/18/14 11:24 AM  Result Value Ref Range   Glucose-Capillary 169 (H) 70 - 99 mg/dL   Comment 1 Documented in Chart    Comment 2 Notify RN     Signed: Charlott Rakes, MD 02/21/2014, 4:22 PM    Services Ordered on Discharge: none Equipment Ordered on Discharge: none

## 2014-02-18 NOTE — Progress Notes (Signed)
  Date: 02/18/2014  Patient name: GLENETTA KIGER  Medical record number: [id number]  Date of birth: 16-Jun-1933   This patient's plan of care was discussed with the house staff. Please see Dr. Serita Grit note for complete details. I concur with his findings.  Patient improved and ready for discharge.  Resolution of AKI to be evaluated in the clinic.    Sid Falcon, MD 02/18/2014, 4:38 PM

## 2014-02-18 NOTE — Progress Notes (Addendum)
Subjective: This AM, family was present at bedside. She denies any complaints. I explained that her illness was likely related to whatever she had at [facility].     Objective: Vital signs in last 24 hours: Filed Vitals:   02/17/14 2034 02/18/14 0157 02/18/14 0625 02/18/14 0923  BP: 168/64 119/96 149/59 156/62  Pulse: 69 67 71 78  Temp: 97.9 F (36.6 C) 99.1 F (37.3 C)  98 F (36.7 C)  TempSrc: Oral Oral Oral Oral  Resp: 18 18 18 18   Height:      Weight:   211 lb 3.2 oz (95.8 kg)   SpO2: 100% 100% 100% 99%   Weight change: 1 lb 3.2 oz (0.545 kg)  Intake/Output Summary (Last 24 hours) at 02/18/14 1201 Last data filed at 02/18/14 8299  Gross per 24 hour  Intake   1037 ml  Output    601 ml  Net    436 ml   General appearance: cooperative and no distress Head: Normocephalic, without obvious abnormality, atraumatic Eyes: conjunctivae/corneas clear. PERRL, EOM's intact. Lungs: clear to auscultation bilaterally from the anterior fields Heart: regular, no added sounds.  Abdomen: soft, non-tender; bowel sounds normal; no masses,  no organomegaly Extremities: extremities normal, atraumatic, no cyanosis or edema  Lab Results: Basic Metabolic Panel:  Recent Labs Lab 02/17/14 0403 02/18/14 0345  NA 137 138  K 4.6 5.2*  CL 107 112  CO2 20 21  GLUCOSE 215* 88  BUN 35* 20  CREATININE 1.28* 1.08  CALCIUM 8.5 8.5  MG 2.1  --   PHOS 4.2  --    Liver Function Tests:  Recent Labs Lab 02/16/14 2309  AST 16  ALT 12  ALKPHOS 67  BILITOT 0.4  PROT 7.4  ALBUMIN 3.6   CBC:  Recent Labs Lab 02/16/14 2309 02/17/14 0403  WBC 7.5 6.6  NEUTROABS 5.0  --   HGB 11.5* 11.8*  HCT 37.0 39.0  MCV 78.1 77.4*  PLT 236 269   Cardiac Enzymes:  Recent Labs Lab 02/17/14 0403  CKTOTAL 41   CBG:  Recent Labs Lab 02/17/14 0321 02/17/14 0543 02/17/14 1120 02/17/14 1651 02/17/14 2046 02/18/14 0627  GLUCAP 196* 165* 200* 88 134* 109*   Urine Drug Screen: Drugs of  Abuse     Component Value Date/Time   LABOPIA NONE DETECTED 10/06/2013 0136   LABOPIA PPS 06/27/2012 1645   COCAINSCRNUR NONE DETECTED 10/06/2013 0136   COCAINSCRNUR NEG 06/27/2012 1645   LABBENZ NONE DETECTED 10/06/2013 0136   LABBENZ PPS 06/27/2012 1645   LABBENZ NEG 07/02/2006 2021   AMPHETMU NONE DETECTED 10/06/2013 0136   AMPHETMU NEG 06/27/2012 1645   AMPHETMU NEG 07/02/2006 2021   THCU NONE DETECTED 10/06/2013 0136   THCU NEG 06/27/2012 1645   LABBARB NONE DETECTED 10/06/2013 0136   LABBARB NEG 06/27/2012 1645    Alcohol Level: No results for input(s): ETH in the last 168 hours. Urinalysis:  Recent Labs Lab 02/17/14 0354  COLORURINE YELLOW  LABSPEC 1.018  PHURINE 6.5  GLUCOSEU NEGATIVE  HGBUR NEGATIVE  BILIRUBINUR NEGATIVE  KETONESUR NEGATIVE  PROTEINUR NEGATIVE  UROBILINOGEN 0.2  NITRITE POSITIVE*  LEUKOCYTESUR LARGE*   Micro Results: Recent Results (from the past 240 hour(s))  Clostridium Difficile by PCR     Status: None   Collection Time: 02/16/14 10:32 PM  Result Value Ref Range Status   C difficile by pcr NEGATIVE NEGATIVE Final  MRSA PCR Screening     Status: None   Collection Time: 02/17/14  3:54 AM  Result Value Ref Range Status   MRSA by PCR NEGATIVE NEGATIVE Final    Comment:        The GeneXpert MRSA Assay (FDA approved for NASAL specimens only), is one component of a comprehensive MRSA colonization surveillance program. It is not intended to diagnose MRSA infection nor to guide or monitor treatment for MRSA infections.    Medications: I have reviewed the patient's current medications. Scheduled Meds: . aspirin EC  81 mg Oral Daily  . FLUoxetine  10 mg Oral QHS  . heparin  5,000 Units Subcutaneous 3 times per day  . insulin aspart  0-20 Units Subcutaneous TID WC  . insulin glargine  17 Units Subcutaneous QHS  . iron polysaccharides  150 mg Oral Daily  . levETIRAcetam  750 mg Oral BID  . pantoprazole  40 mg Oral Daily  .  senna-docusate  1 tablet Oral BID  . simvastatin  20 mg Oral Daily  . sodium chloride  3 mL Intravenous Q12H   Continuous Infusions:   PRN Meds:.acetaminophen **OR** acetaminophen, albuterol, dextromethorphan-guaiFENesin, metoCLOPramide (REGLAN) injection Assessment/Plan:  #Viral gastroenteritis: Likely given sick contact with common food source ([employer]). EF from 04/09/13 with normal EF and grade I diastolic dysfunction. QTc was elevated in the past at 504. C. Diff- neg. Tolerating PO intake and stable for discharge today.  #Hypotension with AKI: Crt 1.08 today, down from 1.47 on admission, but not back to baseline 0.89. Advised her increase PO intake as she tolerates. -Hold HCTZ & lisinopril pending resolution of AKI  #Hypertension: BP 140-160/50-60 -Resume home Coreg 25 mg BID  #DM2: Last A1C 7.6 on 12/26/13. -Continue home Lantus 17 units QHS. -SSI resistant. -CBG ACHS.  #CVA: Right embolic stroke in 5638 with no residual defects. -Continue home aspirin 81 mg.  #Iron deficiency anemia: Hgb 11.8 up from baseline 10 due to hemoconcentration. Last anemia panel on 04/04/13 with ferritin 208 on iron supplementation. MCV 78.1 consistent with iron-deficiency today. -Continue home Ferrex.  #HLD -Continue home statin.  #Depression -Continue home Prozac 10 mg QHS.  #Hx of meningioma with seizures -Continue home Keppra 750 mg BID.  #GERD -Continue home PPI.  Dispo: Stable for discharge today.    The patient does have a current PCP Wilber Oliphant, MD) and does need an [place] hospital follow-up appointment after discharge.  The patient does not have transportation limitations that hinder transportation to clinic appointments.  .Services Needed at time of discharge: Y = Yes, Blank = No PT:   OT:   RN:   Equipment:   Other:     LOS: 2 days   Charlott Rakes, MD 02/18/2014, 12:01 PM

## 2014-02-18 NOTE — Progress Notes (Signed)
Patient is being discharged to home with family. DC IV, DC tele. DC instructions given to patient, patient verbalizes understanding.

## 2014-02-18 NOTE — Progress Notes (Signed)
UR completed 

## 2014-02-19 LAB — GI PATHOGEN PANEL BY PCR, STOOL
C difficile toxin A/B: NEGATIVE
Campylobacter by PCR: NEGATIVE
Cryptosporidium by PCR: NEGATIVE
E COLI (ETEC) LT/ST: NEGATIVE
E COLI (STEC): NEGATIVE
E coli 0157 by PCR: NEGATIVE
G lamblia by PCR: NEGATIVE
NOROVIRUS G1/G2: NEGATIVE
Rotavirus A by PCR: NEGATIVE
SHIGELLA BY PCR: NEGATIVE
Salmonella by PCR: NEGATIVE

## 2014-02-20 LAB — URINE CULTURE: Colony Count: >=100000

## 2014-02-21 ENCOUNTER — Other Ambulatory Visit: Payer: Self-pay | Admitting: *Deleted

## 2014-02-23 LAB — CULTURE, BLOOD (ROUTINE X 2)
CULTURE: NO GROWTH
CULTURE: NO GROWTH

## 2014-03-06 ENCOUNTER — Ambulatory Visit (INDEPENDENT_AMBULATORY_CARE_PROVIDER_SITE_OTHER): Payer: Medicare Other | Admitting: Internal Medicine

## 2014-03-06 ENCOUNTER — Encounter: Payer: Self-pay | Admitting: Internal Medicine

## 2014-03-06 VITALS — BP 124/48 | HR 69 | Temp 98.0°F | Ht 64.0 in | Wt 209.0 lb

## 2014-03-06 DIAGNOSIS — N179 Acute kidney failure, unspecified: Secondary | ICD-10-CM

## 2014-03-06 DIAGNOSIS — I951 Orthostatic hypotension: Secondary | ICD-10-CM

## 2014-03-06 DIAGNOSIS — M129 Arthropathy, unspecified: Secondary | ICD-10-CM

## 2014-03-06 DIAGNOSIS — I10 Essential (primary) hypertension: Secondary | ICD-10-CM | POA: Diagnosis not present

## 2014-03-06 DIAGNOSIS — M1990 Unspecified osteoarthritis, unspecified site: Secondary | ICD-10-CM

## 2014-03-06 DIAGNOSIS — E11329 Type 2 diabetes mellitus with mild nonproliferative diabetic retinopathy without macular edema: Secondary | ICD-10-CM

## 2014-03-06 LAB — GLUCOSE, CAPILLARY: Glucose-Capillary: 136 mg/dL — ABNORMAL HIGH (ref 70–99)

## 2014-03-06 LAB — BASIC METABOLIC PANEL WITH GFR
BUN: 16 mg/dL (ref 6–23)
CO2: 24 mEq/L (ref 19–32)
Calcium: 8.6 mg/dL (ref 8.4–10.5)
Chloride: 110 mEq/L (ref 96–112)
Creat: 1 mg/dL (ref 0.50–1.10)
GFR, Est African American: 62 mL/min
GFR, Est Non African American: 54 mL/min — ABNORMAL LOW
Glucose, Bld: 120 mg/dL — ABNORMAL HIGH (ref 70–99)
Potassium: 4.3 mEq/L (ref 3.5–5.3)
Sodium: 140 mEq/L (ref 135–145)

## 2014-03-06 MED ORDER — ACETAMINOPHEN-CODEINE #3 300-30 MG PO TABS: 1.0000 | ORAL_TABLET | Freq: Two times a day (BID) | ORAL | Status: DC | PRN

## 2014-03-06 MED ORDER — CARVEDILOL 25 MG PO TABS: 25.0000 mg | ORAL_TABLET | Freq: Two times a day (BID) | ORAL | Status: DC

## 2014-03-06 NOTE — Assessment & Plan Note (Addendum)
Tylenol #3 appears to be helping with arthritis pain without being too sedating.  It allows her to be more mobile around her house. -Reordered Tylenol #3 300-30 mg BID PRN, #60 for one month. -Will need pain contract with PCP because she will likely continue long term.

## 2014-03-06 NOTE — Assessment & Plan Note (Addendum)
Orthostatic VS for the past 24 hrs:  BP- Lying Pulse- Lying BP- Sitting Pulse- Sitting BP- Standing at 0 minutes Pulse- Standing at 0 minutes  03/06/14 1142 149/60 mmHg 66 142/63 mmHg 66 125/50 mmHg 68   BP Readings from Last 3 Encounters:  03/06/14 124/48  02/18/14 156/62  01/30/14 179/83   Patient reports light-headedness today and was orthostatic in the clinic.  Blood pressure low for her, but she denies syncope.  Vital signs otherwise stable, and her diarrhea has resolved.  She is likely still dehydrated from her diarrhea. -Check BMP today. -Increase fluid intake slightly, concern with history of CHF for taking in too much fluids. -Continue to hold HCTZ and lisinopril. -Return to clinic in 2 weeks for repeat measurement, sooner if symptoms worsen.

## 2014-03-06 NOTE — Assessment & Plan Note (Signed)
Creatinine was improving upon hospital discharge.  She had a positive urine culture, but she does not have any urinary symptoms. -Recheck BMP.

## 2014-03-06 NOTE — Patient Instructions (Signed)
Thank you for coming to clinic today [name].  General instructions: -Make sure to drink and eat your normal amount. -Don't restart your lisinopril or HCTZ yet.  We will see you back to recheck your blood pressure. -Stop by the lab to get some blood drawn. -Please make a follow up appointment to return to clinic in 2 weeks. -Come back sooner if you continue to feel dizzy or your symptoms get worse.  Please bring your medicines with you each time you come.   Medicines may be  Eye drops  Herbal   Vitamins  Pills  Seeing these help [place] take care of you.

## 2014-03-06 NOTE — Progress Notes (Signed)
   Subjective:    Patient ID: [name], female    DOB: 1933-03-19, 81 y.o.   MRN: [id number]  HPI [name] is a 81 year old woman with history of DM2, CVA, diverticulitis, HTN, HLD, GERD, meningioma in 2012 and depression presenting for hospital follow up.  She was hospitalized from 02/17/14 through 02/21/14 for viral gastroenteritis with acute kidney injury.  She reports feeling better currently, and she has been eating and drinking normally.  She currently denies diarrhea, nausea, or vomiting.  She hasn't been taking her HCTZ or lisinopril.  She does report feeling dizzy with standing.  She denies dysuria, frequency, or other urinary symptoms.  She was prescribed Tylenol #3 for her arthritis, which she says has been helping.  She takes 1-2 tablets per day.  She was on Percocet in the past, but this apparently interacted with her seizure medications.  She was okay off of pain medicine for awhile, but started developing increasing pain in her legs and arms back in October.  She tried Mobic without relief, and she was started on Tylenol #3.  She needs a refill today.  Review of Systems  Constitutional: Negative for fever, chills and fatigue.  HENT: Negative for congestion, rhinorrhea and sore throat.   Respiratory: Negative for cough, chest tightness and wheezing.   Cardiovascular: Negative for chest pain and palpitations.  Gastrointestinal: Negative for nausea, vomiting, abdominal pain, diarrhea and constipation.  Genitourinary: Negative for dysuria and difficulty urinating.  Musculoskeletal: Positive for joint swelling and arthralgias. Negative for myalgias.  Skin: Negative for rash.  Neurological: Positive for dizziness. Negative for weakness, numbness and headaches.       Objective:   Physical Exam  Constitutional: She is oriented to person, place, and time. She appears well-developed and well-nourished. No distress.  HENT:  Head: Normocephalic and atraumatic.  Mouth/Throat:  No oropharyngeal exudate.  Eyes: Conjunctivae and EOM are normal. Pupils are equal, round, and reactive to light. No scleral icterus.  Left eye ptosis.  Cardiovascular: Normal rate, regular rhythm and normal heart sounds.   Pulmonary/Chest: Effort normal and breath sounds normal. No respiratory distress. She has no wheezes.  Abdominal: Soft. Bowel sounds are normal. She exhibits no distension. There is no tenderness.  Musculoskeletal: Normal range of motion. She exhibits no edema or tenderness.  Neurological: She is alert and oriented to person, place, and time. No cranial nerve deficit. She exhibits normal muscle tone.  Skin: Skin is warm and dry. No rash noted. No erythema.          Assessment & Plan:  Please see problem-based assessment and plan.

## 2014-03-07 NOTE — Progress Notes (Signed)
Internal Medicine Clinic Attending  Case discussed with Dr. Moding at the time of the visit.  We reviewed the resident's history and exam and pertinent patient test results.  I agree with the assessment, diagnosis, and plan of care documented in the resident's note. 

## 2014-03-12 ENCOUNTER — Other Ambulatory Visit: Payer: Medicare Other

## 2014-03-14 ENCOUNTER — Ambulatory Visit: Payer: Medicare Other | Admitting: Radiation Oncology

## 2014-03-14 ENCOUNTER — Ambulatory Visit: Payer: Medicare Other

## 2014-03-20 ENCOUNTER — Telehealth: Payer: Self-pay | Admitting: Internal Medicine

## 2014-03-20 NOTE — Telephone Encounter (Signed)
Call to patient to confirm appointment for 03/21/14 at 3:15. Confirmed appt with daughter

## 2014-03-21 ENCOUNTER — Ambulatory Visit: Payer: Medicare Other | Admitting: Internal Medicine

## 2014-03-22 ENCOUNTER — Other Ambulatory Visit: Payer: Self-pay | Admitting: Internal Medicine

## 2014-03-22 ENCOUNTER — Telehealth: Payer: Self-pay | Admitting: Internal Medicine

## 2014-03-22 NOTE — Telephone Encounter (Signed)
Call to patient to confirm appointment for 03/23/14 at 2:15, unable to leave message on cell mail box full. Home is disconnected

## 2014-03-23 ENCOUNTER — Encounter: Payer: Self-pay | Admitting: Internal Medicine

## 2014-03-23 ENCOUNTER — Ambulatory Visit (INDEPENDENT_AMBULATORY_CARE_PROVIDER_SITE_OTHER): Payer: Medicare Other | Admitting: Internal Medicine

## 2014-03-23 VITALS — BP 130/59 | HR 73 | Temp 98.0°F | Wt 213.2 lb

## 2014-03-23 DIAGNOSIS — E11329 Type 2 diabetes mellitus with mild nonproliferative diabetic retinopathy without macular edema: Secondary | ICD-10-CM

## 2014-03-23 DIAGNOSIS — I10 Essential (primary) hypertension: Secondary | ICD-10-CM | POA: Diagnosis not present

## 2014-03-23 DIAGNOSIS — R059 Cough, unspecified: Secondary | ICD-10-CM

## 2014-03-23 DIAGNOSIS — N179 Acute kidney failure, unspecified: Secondary | ICD-10-CM | POA: Diagnosis not present

## 2014-03-23 DIAGNOSIS — R05 Cough: Secondary | ICD-10-CM

## 2014-03-23 DIAGNOSIS — D509 Iron deficiency anemia, unspecified: Secondary | ICD-10-CM

## 2014-03-23 LAB — BASIC METABOLIC PANEL WITH GFR
BUN: 17 mg/dL (ref 6–23)
CHLORIDE: 106 meq/L (ref 96–112)
CO2: 27 mEq/L (ref 19–32)
CREATININE: 1.11 mg/dL — AB (ref 0.50–1.10)
Calcium: 8.5 mg/dL (ref 8.4–10.5)
GFR, Est African American: 55 mL/min — ABNORMAL LOW
GFR, Est Non African American: 48 mL/min — ABNORMAL LOW
GLUCOSE: 165 mg/dL — AB (ref 70–99)
Potassium: 4 mEq/L (ref 3.5–5.3)
SODIUM: 141 meq/L (ref 135–145)

## 2014-03-23 LAB — POCT GLYCOSYLATED HEMOGLOBIN (HGB A1C): HEMOGLOBIN A1C: 7.4

## 2014-03-23 LAB — GLUCOSE, CAPILLARY
Glucose-Capillary: 157 mg/dL — ABNORMAL HIGH (ref 70–99)
Glucose-Capillary: 155 mg/dL — ABNORMAL HIGH (ref 70–99)

## 2014-03-23 LAB — IRON AND TIBC
%SAT: 7 % — AB (ref 20–55)
Iron: 20 ug/dL — ABNORMAL LOW (ref 42–145)
TIBC: 294 ug/dL (ref 250–470)
UIBC: 274 ug/dL (ref 125–400)

## 2014-03-23 MED ORDER — GUAIFENESIN-DM 100-10 MG/5ML PO SYRP: 5.0000 mL | ORAL_SOLUTION | ORAL | Status: DC | PRN

## 2014-03-23 NOTE — Patient Instructions (Addendum)
General Instructions:  Dear Ms. Robello,  Thank you for coming in today.   We will keep your medications the same for now. Try to check your BP at home at least once a day and record your values.   Keep working on your blood sugars  You can try to robitussin cough syrup to help with the cough  Treatment Goals:  Goals (1 Years of Data) as of 03/23/14          As of Today As of Today 03/06/14 02/18/14 02/18/14     Blood Pressure   . Blood Pressure < 130/80  130/59 149/54 124/48 156/62 149/59     Result Component   . HEMOGLOBIN A1C < 8.0         . LDL CALC < 100            Progress Toward Treatment Goals:  Treatment Goal 03/23/2014  Hemoglobin A1C -  Blood pressure at goal    Self Care Goals & Plans:  Self Care Goal 03/06/2014  Manage my medications take my medicines as prescribed; bring my medications to every visit; refill my medications on time  Monitor my health keep track of my blood glucose; bring my glucose meter and log to each visit  Eat healthy foods drink diet soda or water instead of juice or soda; eat more vegetables; eat foods that are low in salt; eat baked foods instead of fried foods; eat fruit for snacks and desserts  Be physically active -    Home Blood Glucose Monitoring 03/23/2014  Check my blood sugar 3 times a day  When to check my blood sugar before meals     Care Management & Community Referrals:  Referral 10/13/2013  Referrals made for care management support social worker

## 2014-03-23 NOTE — Progress Notes (Signed)
Subjective:   Patient ID: [name] female   DOB: Sep 01, 1933 81 y.o.   MRN: [id number]  HPI: [name] is a 81 y.o. female with PMH as listed below who presents to opc today for routine follow up visit.   S/p hospital admission 02/18/14 for AKI in setting of viral gastroenteritis and HCTZ and Lisinopril was held that admission. She then was seen for hospital follow up in [facility] and noted to have orthostatic hypotension, so BP medications were continued to be held with the exception of BB. Today, BP on repeat improved to 130/59 on current regimen. She no longer has home nursing as she is not considered homebound any more.   [name] has no complaints today other than a cough that started yesterday, mostly at night, intermittent and non-productive. No fever or chills. No recent sick contacts. Improves with robitussin but daughter says needs a refill.  Otherwise, [name] reports feeling well since hospitalization.  Her daughter is with her today and says they missed some appointments during the snow but have rescheduled and will see neurosurgery next month.   Past Medical History  Diagnosis Date  . Diabetes mellitus 2007    HgA1C (02/20/2010) = 9.2, HgA1C (03/20/2009) = 12.1  . CVA (cerebrovascular accident) 4431     right embolic stroke, no residual deficits  . Diverticulitis   . CVA (cerebral infarction) 7-yrs ago  . Arthritis   . Dysphagia   . CHF (congestive heart failure)   . DIABETES MELLITUS, TYPE II 12/04/2005  . HYPERLIPIDEMIA 12/04/2005  . HYPERTENSION 12/04/2005  . GERD 12/04/2005  . ARTHRITIS, KNEE 03/24/2006  . Lung mass 07/08/2010  . Meningioma 07/21/2010  . Hemangioma of liver 12/02/2010  . Depression 12/02/2010  . Adenocarcinoma of lung      Right upper lobe adenocarcinoma. s/p right lower lobectomy 12/15/10  . 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  acetaminophen-codeine (TYLENOL #3) 300-30 MG per tablet Take 1 tablet by mouth 2 (two) times daily as needed for moderate pain. 60 tablet 0  . albuterol (PROVENTIL HFA;VENTOLIN HFA) 108 (90 BASE) MCG/ACT inhaler Inhale 2 puffs into the lungs every 4 (four) hours as needed for wheezing. 3 Inhaler 1  . albuterol (PROVENTIL) (2.5 MG/3ML) 0.083% nebulizer solution Take 3 mLs (2.5 mg total) by nebulization every 6 (six) hours as needed for wheezing. 75 mL 12  . aspirin 81 MG tablet Take 81 mg by mouth daily.     . carvedilol (COREG) 25 MG tablet Take 1 tablet (25 mg total) by mouth 2 (two) times daily with a meal. 180 tablet 4  . FERREX 150 150 MG capsule TAKE 1 CAPSULE (150 MG TOTAL) BY MOUTH DAILY. 30 capsule 1  . FLUoxetine (PROZAC) 10 MG capsule Take 1 capsule (10 mg total) by mouth at bedtime. 30 capsule 3  . glucose blood (ACCU-CHEK AVIVA) test strip Use to Check Blood Sugars 3 Times Daily.Dx Code: 250.00. Insulin dependent 100 each 12  . guaiFENesin-dextromethorphan (ROBITUSSIN DM) 100-10 MG/5ML syrup Take 5 mLs by mouth every 4 (four) hours as needed for cough. 118 mL 0  . insulin aspart (NOVOLOG FLEXPEN) 100 UNIT/ML FlexPen Novolog Insulin: 91 - 160 5 units 161- 230 7 units 231-300 9 units 301- 370 11 units 371 - 440 13 units 441 - 510 15 units 15 mL 11  . Insulin Glargine (LANTUS SOLOSTAR)  100 UNIT/ML Solostar Pen Inject 17 Units into the skin at bedtime. 10 mL 5  . levETIRAcetam (KEPPRA) 750 MG tablet Take 1 tablet (750 mg total) by mouth 2 (two) times daily. 60 tablet 11  . omeprazole (PRILOSEC) 20 MG capsule Take 1 capsule (20 mg total) by mouth daily. 30 capsule 3  . simvastatin (ZOCOR) 20 MG tablet Take 20 mg by mouth daily.     No current facility-administered medications for this visit.   Family History  Problem Relation Age of Onset  . Hyperlipidemia Brother   . Hypertension Brother   . Diabetes Brother    History   Social History  . Marital Status: Widowed      Spouse Name: N/A    Number of Children: N/A  . Years of Education: 10   Occupational History  .  Unemployed   Social History Main Topics  . Smoking status: Former Smoker    Types: Cigarettes    Quit date: 02/17/2000  . Smokeless tobacco: Never Used  . Alcohol Use: No  . Drug Use: No  . Sexual Activity: Not Currently   Other Topics Concern  . Not on file   Social History Narrative   Patient requests to use [employer] for her diabets testing supplies as of 09/05/2009.   Review of Systems:  Constitutional:  Denies fever, chills  HEENT:  Denies congestion  Respiratory:  Denies SOB. +cough  Cardiovascular:  Denies chest pain  Gastrointestinal:  Denies nausea, vomiting, abdominal pain  Genitourinary:  Denies dysuria  Musculoskeletal:  Chronic joint pain but feeling good today  Neurological:  Denies any recent seizures   Objective:  Physical Exam: Filed Vitals:   03/23/14 1450 03/23/14 1517  BP: 149/54 130/59  Pulse: 74 73  Temp: 98 F (36.7 C)   TempSrc: Oral   Weight: 213 lb 3.2 oz (96.707 kg)   SpO2: 99%    Vitals reviewed. General: sitting in wheel chair, NAD HEENT: EOMI Cardiac: RRR Pulm: clear to auscultation bilaterally, no wheezes, rales, or rhonchi Abd: soft, BS present Ext: moving both extremities Neuro: alert and oriented X3, strength grossly intact with good effort  Assessment & Plan:  Discussed with Dr. Beryle Beams

## 2014-03-23 NOTE — Progress Notes (Signed)
Medicine attending: Medical history, presenting problems, physical findings, and medications, reviewed with Dr Samaya Quereshi and I concur with her evaluation and management plan. 

## 2014-03-24 DIAGNOSIS — R059 Cough, unspecified: Secondary | ICD-10-CM | POA: Insufficient documentation

## 2014-03-24 DIAGNOSIS — R05 Cough: Secondary | ICD-10-CM | POA: Insufficient documentation

## 2014-03-24 LAB — FERRITIN: Ferritin: 38 ng/mL (ref 10–291)

## 2014-03-24 NOTE — Assessment & Plan Note (Signed)
BP Readings from Last 3 Encounters:  03/23/14 130/59  03/06/14 124/48  02/18/14 156/62    Lab Results  Component Value Date   NA 141 03/23/2014   K 4.0 03/23/2014   CREATININE 1.11* 03/23/2014   Assessment: Blood pressure control: controlled Progress toward BP goal:  at goal Comments: improved on repeat   Plan: Medications:  continue current medications coreg 25mg  bid. Continue to keep off ACEi and HCTZ given well controlled BP.  Educational resources provided:   Self management tools provided:   Other plans: bmet shows slightly increase Cr since hospital follow up.

## 2014-03-24 NOTE — Assessment & Plan Note (Signed)
Lab Results  Component Value Date   HGBA1C 7.4 03/23/2014   HGBA1C 7.6 12/26/2013   HGBA1C 11.0* 10/06/2013    Assessment: Diabetes control: fair control Progress toward A1C goal:    Comments: continues to improve  Plan: Medications:  continue current medications novolog on sliding scale, hold novolog at night if giving both lantus and novolog at same time unless cbg's significantly elevated. Ideally, she should be getting novolog prior to meals and then lantus before bedtime after a few hours of her last meal. Daughter says that is what they normally but sometimes she goes to bed right after her meal.  Home glucose monitoring: Frequency: 3 times a day Timing: before meals Instruction/counseling given: reminded to bring blood glucose meter & log to each visit, reminded to bring medications to each visit and discussed diet Educational resources provided:   Self management tools provided:   Other plans: no change to therapy at this time, will need to bring meter to next visit to make sure cbg's are not running low although not endorsed by daughter at this time. No longer have home health nurse

## 2014-03-24 NOTE — Assessment & Plan Note (Signed)
Checked iron panel today--iron level still low but slowly improving. Ferritin has decreased.   -continue ferrex

## 2014-03-24 NOTE — Assessment & Plan Note (Signed)
Non-productive, started last night, maybe feels like a cold may be coming on. Daughter reports improvement with robitussin but needs refill. No sick contacts.   -robitussin DM refilled today

## 2014-03-24 NOTE — Assessment & Plan Note (Signed)
Cr slightly increased since January. Will continue to monitor and stay off ACE and HCTZ for now. BP well controlled. DM2 A1C improving.

## 2014-04-18 ENCOUNTER — Other Ambulatory Visit: Payer: Self-pay | Admitting: Internal Medicine

## 2014-05-21 ENCOUNTER — Telehealth: Payer: Self-pay | Admitting: *Deleted

## 2014-05-21 ENCOUNTER — Other Ambulatory Visit: Payer: Self-pay | Admitting: Internal Medicine

## 2014-05-21 NOTE — Telephone Encounter (Signed)
Pt's daughter calls and states her mother is having a lot of pain in her legs and needs something stronger than tylenol #3

## 2014-05-22 NOTE — Telephone Encounter (Signed)
Hi Helen,  I cannot prescribe anything stronger without patient being evaluated. Please have her seen in opc for the leg pain.   Thank you,  Dr. Barnetta Chapel

## 2014-05-23 NOTE — Telephone Encounter (Signed)
Thank you so much helen.   Hopefully they can find out a slot that works for them.   Thanks,  Dr q

## 2014-05-23 NOTE — Telephone Encounter (Signed)
i have tried to call daughter back and was told i had the wrong #, checked and it was the 954# we have listed, the other # has been disconnected, just rec'd a call from the 954# and pt wanted to be seen, she was offered an appt this pm at 1545 and daughter refused it saying pt takes 2 hours to get dressed, nothing available thurs or fri, daughter stated she is tired of pt moaning and groaning and having to run here and run there and change medicines, triage gave positive input that we are sure pt appreciates her daughter helping her. Triage made the schedulers aware that pt would need to be put in any slot if there is a cancellation, will need 3 to 4 hrs notice

## 2014-05-31 NOTE — Telephone Encounter (Signed)
i called to check on pt this am, got vmail, left message for rtc

## 2014-06-07 ENCOUNTER — Ambulatory Visit (INDEPENDENT_AMBULATORY_CARE_PROVIDER_SITE_OTHER): Payer: Medicare Other | Admitting: Internal Medicine

## 2014-06-07 ENCOUNTER — Encounter: Payer: Self-pay | Admitting: Internal Medicine

## 2014-06-07 VITALS — BP 201/79 | HR 72 | Temp 98.1°F | Ht 64.0 in | Wt 216.5 lb

## 2014-06-07 DIAGNOSIS — I10 Essential (primary) hypertension: Secondary | ICD-10-CM

## 2014-06-07 DIAGNOSIS — M179 Osteoarthritis of knee, unspecified: Secondary | ICD-10-CM | POA: Diagnosis not present

## 2014-06-07 DIAGNOSIS — M129 Arthropathy, unspecified: Secondary | ICD-10-CM

## 2014-06-07 DIAGNOSIS — M1990 Unspecified osteoarthritis, unspecified site: Secondary | ICD-10-CM

## 2014-06-07 MED ORDER — ACETAMINOPHEN-CODEINE #3 300-30 MG PO TABS: 1.0000 | ORAL_TABLET | Freq: Four times a day (QID) | ORAL | Status: DC | PRN

## 2014-06-07 NOTE — Progress Notes (Signed)
[facility] Subjective:   Patient ID: [name] female   DOB: 1933-07-29 81 y.o.   MRN: [id number]  HPI: [name] is a 81 y.o. female with a PMH detailed below who presents for bilateral knee pain.  She reports she has had joint pains for many years she does not a previous fracture of her right ankle but otherwise denies trauma to her knees.  She reports she has been taking Tylenol #3 twice a day. She does note some mild pain relief with this medication but notes that she does not get enough relief.  She would like to go to a stronger pain medication.  HTN: her blood pressure is elevated today, she reports she is taking her BP medications and daughter confirms this.    Past Medical History  Diagnosis Date  . Diabetes mellitus 2007    HgA1C (02/20/2010) = 9.2, HgA1C (03/20/2009) = 12.1  . CVA (cerebrovascular accident) 7517     right embolic stroke, no residual deficits  . Diverticulitis   . CVA (cerebral infarction) 7-yrs ago  . Arthritis   . Dysphagia   . CHF (congestive heart failure)   . DIABETES MELLITUS, TYPE II 12/04/2005  . HYPERLIPIDEMIA 12/04/2005  . HYPERTENSION 12/04/2005  . GERD 12/04/2005  . ARTHRITIS, KNEE 03/24/2006  . Lung mass 07/08/2010  . Meningioma 07/21/2010  . Hemangioma of liver 12/02/2010  . Depression 12/02/2010  . Adenocarcinoma of lung      Right upper lobe adenocarcinoma. s/p right lower lobectomy 12/15/10  . 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 acetaminophen-codeine (TYLENOL #3) 300-30 MG per tablet Take 1 tablet by mouth every 6 (six) hours as needed for moderate pain. 100 tablet 0  . albuterol (PROVENTIL HFA;VENTOLIN HFA) 108 (90 BASE) MCG/ACT inhaler Inhale 2 puffs into the lungs every 4 (four) hours as needed for wheezing. 3 Inhaler 1  . albuterol (PROVENTIL) (2.5 MG/3ML) 0.083% nebulizer solution Take 3 mLs  (2.5 mg total) by nebulization every 6 (six) hours as needed for wheezing. 75 mL 12  . aspirin 81 MG tablet Take 81 mg by mouth daily.     . carvedilol (COREG) 25 MG tablet Take 1 tablet (25 mg total) by mouth 2 (two) times daily with a meal. 180 tablet 4  . FERREX 150 150 MG capsule TAKE 1 CAPSULE BY MOUTH DAILY. 30 capsule 3  . FLUoxetine (PROZAC) 10 MG capsule Take 1 capsule (10 mg total) by mouth at bedtime. 30 capsule 3  . glucose blood (ACCU-CHEK AVIVA) test strip Use to Check Blood Sugars 3 Times Daily.Dx Code: 250.00. Insulin dependent 100 each 12  . guaiFENesin-dextromethorphan (ROBITUSSIN DM) 100-10 MG/5ML syrup Take 5 mLs by mouth every 4 (four) hours as needed for cough. 118 mL 0  . insulin aspart (NOVOLOG FLEXPEN) 100 UNIT/ML FlexPen Novolog Insulin: 91 - 160 5 units 161- 230 7 units 231-300 9 units 301- 370 11 units 371 - 440 13 units 441 - 510 15 units 15 mL 11  . Insulin Glargine (LANTUS SOLOSTAR) 100 UNIT/ML Solostar Pen Inject 17 Units into the skin at bedtime. 10 mL 5  . levETIRAcetam (KEPPRA) 750 MG tablet Take 1 tablet (750 mg total) by mouth 2 (two) times daily. 60 tablet 11  . omeprazole (PRILOSEC) 20 MG capsule Take 1 capsule (20 mg total) by mouth daily. 30 capsule 3  .  simvastatin (ZOCOR) 20 MG tablet Take 20 mg by mouth daily.     No current facility-administered medications for this visit.   Family History  Problem Relation Age of Onset  . Hyperlipidemia Brother   . Hypertension Brother   . Diabetes Brother    History   Social History  . Marital Status: Widowed    Spouse Name: N/A  . Number of Children: N/A  . Years of Education: 10   Occupational History  .  Unemployed   Social History Main Topics  . Smoking status: Former Smoker    Types: Cigarettes    Quit date: 02/17/2000  . Smokeless tobacco: Never Used  . Alcohol Use: No  . Drug Use: No  . Sexual Activity: Not Currently   Other Topics Concern  . None   Social History  Narrative   Patient requests to use [employer] for her diabets testing supplies as of 09/05/2009.   Review of Systems: Review of Systems  Constitutional: Negative for fever, chills, weight loss and malaise/fatigue.  Eyes: Negative for blurred vision.  Respiratory: Negative for cough and shortness of breath.   Cardiovascular: Negative for chest pain and leg swelling.  Gastrointestinal: Negative for heartburn and abdominal pain.  Genitourinary: Negative for dysuria.  Musculoskeletal: Positive for joint pain. Negative for myalgias and falls.  Neurological: Negative for dizziness and headaches.  Endo/Heme/Allergies: Negative for polydipsia.  Psychiatric/Behavioral: Negative for substance abuse.     Objective:  Physical Exam: Filed Vitals:   06/07/14 1531  BP: 201/79  Pulse: 72  Temp: 98.1 F (36.7 C)  TempSrc: Oral  Height: '5\' 4"'$  (1.626 m)  Weight: 216 lb 8 oz (98.204 kg)  SpO2: 100%  Physical Exam  Constitutional: She is well-developed, well-nourished, and in no distress.  Cardiovascular: Normal rate and regular rhythm.   Pulmonary/Chest: Effort normal and breath sounds normal.  Musculoskeletal:       Right knee: She exhibits no swelling, no effusion, normal alignment, no LCL laxity, normal patellar mobility, normal meniscus and no MCL laxity. Tenderness found. Medial joint line tenderness noted. No lateral joint line tenderness noted.       Left knee: She exhibits normal range of motion, no swelling, no LCL laxity, normal meniscus and no MCL laxity. Tenderness found. Medial joint line and lateral joint line tenderness noted. No MCL and no LCL tenderness noted.  Skin:     Nursing note and vitals reviewed.   Assessment & Plan:  Case discussed with Dr. Dareen Piano  Essential hypertension BP Readings from Last 3 Encounters:  06/07/14 201/79  03/23/14 130/59  03/06/14 124/48    Lab Results  Component Value Date   NA 141 03/23/2014   K 4.0 03/23/2014   CREATININE  1.11* 03/23/2014    Assessment: Blood pressure control: severely elevated Progress toward BP goal:  deteriorated Comments: BP reading may be outlier  Plan: Medications:  Coreg '25mg'$  BID Educational resources provided:   Self management tools provided:   Other plans: may consider more selective BB in future. As this was a same day visit for acute on chronic knee pain I am hesitant to change her BP medications. I have instructed her to continue her medications and follow up in 1 month for recheck.    Arthritis pain - Review of her records shows that she has had her knees Xrayed in 2004 and 2008, I have reviewed these reports which show some mild degernative changes suggestive of osteoarthritis. - I offered her a steroid knee  injection to the joint that is hurting her more than the other side (left knee) - Will increase her Tylenol #3 to Q6 hours and give her 100 pills a month, I have instructed that this will allow for 1 pill 3 times a day with an extra breakthrough pill 10 days of the month if needed.  She will follow up in 1 month to see how she does with this medication dosing.      Medications Ordered Meds ordered this encounter  Medications  . acetaminophen-codeine (TYLENOL #3) 300-30 MG per tablet    Sig: Take 1 tablet by mouth every 6 (six) hours as needed for moderate pain.    Dispense:  100 tablet    Refill:  0   Other Orders No orders of the defined types were placed in this encounter.    PROCEDURE NOTE  PROCEDURE: left knee joint steroid injection.  PREOPERATIVE DIAGNOSIS: Osteoarthritis of the left knee.  POSTOPERATIVE DIAGNOSIS: Osteoarthritis of the left knee.  PROCEDURE: The patient was apprised of the risks and the benefits of the procedure and informed consent was obtained, as witnessed by Lela. Time-out procedure was performed, with confirmation of the patient's name, date of birth, and correct identification of the left knee to be injected. The patient's  knee was then marked at the appropriate site for injection placement. The knee was sterilely prepped with Betadine. A 40 mg (1 milliliter) solution of Kenalog was drawn up into a 5 mL syringe with a 1 mL of 1% lidocaine. The patient was injected with a 27-gauge needle at the lateral aspect of her left flexed knee. There were no complications. The patient tolerated the procedure well. There was minimal bleeding. The patient was instructed to ice her knee upon leaving clinic and refrain from overuse over the next 3 days. The patient was instructed to go to the emergency room with any usual pain, swelling, or redness occurred in the injected area. The patient was given a followup appointment to evaluate response to the injection to his increased range of motion and reduction of pain.  The procedure was supervised by attending physician, Dr. Dareen Piano.

## 2014-06-07 NOTE — Patient Instructions (Signed)
General Instructions: Try taking the tylenol more often. I want to see you back in 1 month to recheck your blood pressure.  Please bring your medicines with you each time you come to clinic.  Medicines may include prescription medications, over-the-counter medications, herbal remedies, eye drops, vitamins, or other pills.   Progress Toward Treatment Goals:  Treatment Goal 03/23/2014  Hemoglobin A1C -  Blood pressure elevated    Self Care Goals & Plans:  Self Care Goal 03/23/2014  Manage my medications take my medicines as prescribed; bring my medications to every visit; refill my medications on time  Monitor my health keep track of my blood glucose; bring my glucose meter and log to each visit  Eat healthy foods eat more vegetables; eat foods that are low in salt; eat baked foods instead of fried foods  Be physically active find an activity I enjoy    Home Blood Glucose Monitoring 03/23/2014  Check my blood sugar 3 times a day  When to check my blood sugar before meals     Care Management & Community Referrals:  Referral 10/13/2013  Referrals made for care management support social worker

## 2014-06-07 NOTE — Assessment & Plan Note (Signed)
-   Review of her records shows that she has had her knees Xrayed in 2004 and 2008, I have reviewed these reports which show some mild degernative changes suggestive of osteoarthritis. - I offered her a steroid knee injection to the joint that is hurting her more than the other side (left knee) - Will increase her Tylenol #3 to Q6 hours and give her 100 pills a month, I have instructed that this will allow for 1 pill 3 times a day with an extra breakthrough pill 10 days of the month if needed.  She will follow up in 1 month to see how she does with this medication dosing.

## 2014-06-07 NOTE — Assessment & Plan Note (Signed)
BP Readings from Last 3 Encounters:  06/07/14 201/79  03/23/14 130/59  03/06/14 124/48    Lab Results  Component Value Date   NA 141 03/23/2014   K 4.0 03/23/2014   CREATININE 1.11* 03/23/2014    Assessment: Blood pressure control: severely elevated Progress toward BP goal:  deteriorated Comments: BP reading may be outlier  Plan: Medications:  Coreg '25mg'$  BID Educational resources provided:   Self management tools provided:   Other plans: may consider more selective BB in future. As this was a same day visit for acute on chronic knee pain I am hesitant to change her BP medications. I have instructed her to continue her medications and follow up in 1 month for recheck.

## 2014-06-08 NOTE — Progress Notes (Signed)
INTERNAL MEDICINE TEACHING ATTENDING ADDENDUM - Aldine Contes, MD: I personally saw and evaluated Mrs. Hillman in this clinic visit in conjunction with the resident, Dr. Heber Creston. I have discussed patient's plan of care with medical resident during this visit. I have confirmed the physical exam findings and have read and agree with the clinic note including the plan with the following addition: - I was present when Dr. Heber Harding performed a steroid injection in her left knee using aseptic precautions and after performing a time out - Will continue with pain control and monitor her for improvement

## 2014-06-12 ENCOUNTER — Other Ambulatory Visit: Payer: Self-pay | Admitting: Internal Medicine

## 2014-06-27 ENCOUNTER — Other Ambulatory Visit: Payer: Self-pay | Admitting: Internal Medicine

## 2014-06-28 ENCOUNTER — Encounter: Payer: Self-pay | Admitting: *Deleted

## 2014-06-30 ENCOUNTER — Telehealth: Payer: Self-pay | Admitting: Internal Medicine

## 2014-06-30 ENCOUNTER — Telehealth: Payer: Self-pay | Admitting: Family Medicine

## 2014-06-30 NOTE — Telephone Encounter (Signed)
  Reason for call:   I placed an outgoing call to Ms. [name] (daughter) at 1:15AM.  Her daughter states her cbg was 6 and had been giving 9 units of novolog about 15 minutes prior to calling.  She stated her cbg was going higher and now was 472?  I instructed the daughter not to give additional insulin and recheck in about an hour.  She had also lost the SS so I verbally gave this to her over the phone and she repeated the scale back to me and verbalized understanding.     Assessment/ Plan:   Hyperglycemia: Ms. Borchard daughter had lost the SS she received upon moving from a different house today.  I gave her the SS that was present in her chart.  She had given 9 units of novolog about 15 minutes prior to calling with a cbg of 436.  I instructed her not to give additional insulin at this point and to recheck her cbg in about 1 hour.  No further advice provided.    As always, pt is advised that if symptoms worsen or new symptoms arise, they should go to an urgent care facility or to to ER for further evaluation.   Jones Bales, MD   06/30/2014, 1:14 AM

## 2014-06-30 NOTE — Telephone Encounter (Signed)
   Reason for call:   I received a call from Ms. Jairo Ben Losh's daughter at 11:30  PM indicating that her mother's CBG was improved from last night and is down in the 240s. However, she does noted that her mother vomited this evening x1 and the daughter was concerned.    Pertinent Data:   Emesis x1 with poor po intake tonight; no further emesis since initial episode at least 1 hr prior to phone call. No fevers or sick contacts. No hypoglycemia. Pt alert, without metal status changes, changes in weakness, and is responsive, per daughter and denies any pain. Per daughter, pt is without complaints.    Assessment / Plan / Recommendations:   I advised the daughter that since her mother endorsed feeling well and had no mental status changes and her CBGs had improved that there did not seem to be any immediate reason for the patient to be evaluated by a health provider. However, since the daughter lives with the patient and knows her much better than I, I advised her that if she felt that her mother needed to be evaluated by a health professional, she could bring her mother to an urgent care or to the ED.   She was asked to check her mother CBGs q4h in the setting of poor po intake, and she was encouraged to keep her mother hydrated.   I advised the daughter that if her mother became lethargic, confused, or less responsive that she should immediately call EMS.   As always, pt is advised that if symptoms worsen or new symptoms arise, they should go to an urgent care facility or to to ER for further evaluation.   Otho Bellows, MD   06/30/2014, 11:29 PM

## 2014-07-01 ENCOUNTER — Encounter (HOSPITAL_COMMUNITY): Payer: Self-pay | Admitting: *Deleted

## 2014-07-01 ENCOUNTER — Emergency Department (HOSPITAL_COMMUNITY)
Admission: EM | Admit: 2014-07-01 | Discharge: 2014-07-01 | Disposition: A | Discharge status: Home / Self Care | Payer: Medicare Other | Attending: Emergency Medicine | Admitting: Emergency Medicine

## 2014-07-01 ENCOUNTER — Emergency Department (HOSPITAL_COMMUNITY): Payer: Medicare Other

## 2014-07-01 DIAGNOSIS — Z85841 Personal history of malignant neoplasm of brain: Secondary | ICD-10-CM | POA: Insufficient documentation

## 2014-07-01 DIAGNOSIS — Z79899 Other long term (current) drug therapy: Secondary | ICD-10-CM | POA: Diagnosis not present

## 2014-07-01 DIAGNOSIS — G311 Senile degeneration of brain, not elsewhere classified: Secondary | ICD-10-CM | POA: Diagnosis not present

## 2014-07-01 DIAGNOSIS — Z86011 Personal history of benign neoplasm of the brain: Secondary | ICD-10-CM | POA: Insufficient documentation

## 2014-07-01 DIAGNOSIS — Z85118 Personal history of other malignant neoplasm of bronchus and lung: Secondary | ICD-10-CM | POA: Insufficient documentation

## 2014-07-01 DIAGNOSIS — R531 Weakness: Secondary | ICD-10-CM

## 2014-07-01 DIAGNOSIS — M1990 Unspecified osteoarthritis, unspecified site: Secondary | ICD-10-CM | POA: Diagnosis not present

## 2014-07-01 DIAGNOSIS — Z8505 Personal history of malignant neoplasm of liver: Secondary | ICD-10-CM | POA: Diagnosis not present

## 2014-07-01 DIAGNOSIS — Z8673 Personal history of transient ischemic attack (TIA), and cerebral infarction without residual deficits: Secondary | ICD-10-CM | POA: Diagnosis not present

## 2014-07-01 DIAGNOSIS — Z7982 Long term (current) use of aspirin: Secondary | ICD-10-CM | POA: Diagnosis not present

## 2014-07-01 DIAGNOSIS — E785 Hyperlipidemia, unspecified: Secondary | ICD-10-CM | POA: Diagnosis not present

## 2014-07-01 DIAGNOSIS — Z0000 Encounter for general adult medical examination without abnormal findings: Secondary | ICD-10-CM | POA: Diagnosis present

## 2014-07-01 DIAGNOSIS — E119 Type 2 diabetes mellitus without complications: Secondary | ICD-10-CM | POA: Diagnosis not present

## 2014-07-01 DIAGNOSIS — Z794 Long term (current) use of insulin: Secondary | ICD-10-CM | POA: Insufficient documentation

## 2014-07-01 DIAGNOSIS — I252 Old myocardial infarction: Secondary | ICD-10-CM | POA: Diagnosis not present

## 2014-07-01 DIAGNOSIS — I10 Essential (primary) hypertension: Secondary | ICD-10-CM | POA: Insufficient documentation

## 2014-07-01 DIAGNOSIS — J984 Other disorders of lung: Secondary | ICD-10-CM | POA: Diagnosis not present

## 2014-07-01 DIAGNOSIS — F329 Major depressive disorder, single episode, unspecified: Secondary | ICD-10-CM | POA: Diagnosis not present

## 2014-07-01 DIAGNOSIS — G40909 Epilepsy, unspecified, not intractable, without status epilepticus: Secondary | ICD-10-CM | POA: Diagnosis not present

## 2014-07-01 DIAGNOSIS — R030 Elevated blood-pressure reading, without diagnosis of hypertension: Secondary | ICD-10-CM | POA: Diagnosis not present

## 2014-07-01 DIAGNOSIS — R630 Anorexia: Secondary | ICD-10-CM | POA: Diagnosis not present

## 2014-07-01 DIAGNOSIS — I509 Heart failure, unspecified: Secondary | ICD-10-CM | POA: Diagnosis not present

## 2014-07-01 DIAGNOSIS — N390 Urinary tract infection, site not specified: Secondary | ICD-10-CM | POA: Diagnosis not present

## 2014-07-01 DIAGNOSIS — Z87891 Personal history of nicotine dependence: Secondary | ICD-10-CM | POA: Insufficient documentation

## 2014-07-01 DIAGNOSIS — K219 Gastro-esophageal reflux disease without esophagitis: Secondary | ICD-10-CM | POA: Insufficient documentation

## 2014-07-01 DIAGNOSIS — R410 Disorientation, unspecified: Secondary | ICD-10-CM | POA: Diagnosis not present

## 2014-07-01 DIAGNOSIS — M6281 Muscle weakness (generalized): Secondary | ICD-10-CM | POA: Diagnosis not present

## 2014-07-01 DIAGNOSIS — G9389 Other specified disorders of brain: Secondary | ICD-10-CM | POA: Diagnosis not present

## 2014-07-01 LAB — URINALYSIS, ROUTINE W REFLEX MICROSCOPIC
Bilirubin Urine: NEGATIVE
KETONES UR: 15 mg/dL — AB
Nitrite: NEGATIVE
Protein, ur: 30 mg/dL — AB
Specific Gravity, Urine: 1.025 (ref 1.005–1.030)
UROBILINOGEN UA: 0.2 mg/dL (ref 0.0–1.0)
pH: 5 (ref 5.0–8.0)

## 2014-07-01 LAB — BASIC METABOLIC PANEL
Anion gap: 12 (ref 5–15)
BUN: 17 mg/dL (ref 6–20)
CALCIUM: 8.5 mg/dL — AB (ref 8.9–10.3)
CHLORIDE: 105 mmol/L (ref 101–111)
CO2: 24 mmol/L (ref 22–32)
CREATININE: 1.03 mg/dL — AB (ref 0.44–1.00)
GFR calc Af Amer: 59 mL/min — ABNORMAL LOW (ref >60)
GFR calc non Af Amer: 51 mL/min — ABNORMAL LOW (ref >60)
Glucose, Bld: 346 mg/dL — ABNORMAL HIGH (ref 65–99)
Potassium: 3.8 mmol/L (ref 3.5–5.1)
SODIUM: 141 mmol/L (ref 135–145)

## 2014-07-01 LAB — CBC WITH DIFFERENTIAL/PLATELET
Basophils Absolute: 0 10*3/uL (ref 0.0–0.1)
Basophils Relative: 0 % (ref 0–1)
Eosinophils Absolute: 0 10*3/uL (ref 0.0–0.7)
Eosinophils Relative: 0 % (ref 0–5)
HEMATOCRIT: 37.7 % (ref 36.0–46.0)
HEMOGLOBIN: 11.5 g/dL — AB (ref 12.0–15.0)
Lymphocytes Relative: 11 % — ABNORMAL LOW (ref 12–46)
Lymphs Abs: 1 10*3/uL (ref 0.7–4.0)
MCH: 23.8 pg — AB (ref 26.0–34.0)
MCHC: 30.5 g/dL (ref 30.0–36.0)
MCV: 77.9 fL — ABNORMAL LOW (ref 78.0–100.0)
MONOS PCT: 14 % — AB (ref 3–12)
Monocytes Absolute: 1.3 10*3/uL — ABNORMAL HIGH (ref 0.1–1.0)
NEUTROS ABS: 7.3 10*3/uL (ref 1.7–7.7)
NEUTROS PCT: 76 % (ref 43–77)
Platelets: 264 10*3/uL (ref 150–400)
RBC: 4.84 MIL/uL (ref 3.87–5.11)
RDW: 15 % (ref 11.5–15.5)
WBC: 9.6 10*3/uL (ref 4.0–10.5)

## 2014-07-01 LAB — TROPONIN I

## 2014-07-01 LAB — URINE MICROSCOPIC-ADD ON

## 2014-07-01 MED ORDER — CEPHALEXIN 500 MG PO CAPS: 500.0000 mg | ORAL_CAPSULE | Freq: Two times a day (BID) | ORAL | Status: DC

## 2014-07-01 MED ORDER — SODIUM CHLORIDE 0.9 % IV BOLUS (SEPSIS)
1000.0000 mL | Freq: Once | INTRAVENOUS | Status: AC
  Administered 2014-07-01: 1000 mL via INTRAVENOUS

## 2014-07-01 MED ORDER — DEXTROSE 5 % IV SOLN
1.0000 g | Freq: Once | INTRAVENOUS | Status: AC
  Administered 2014-07-01: 1 g via INTRAVENOUS
  Filled 2014-07-01: qty 10

## 2014-07-01 NOTE — ED Provider Notes (Addendum)
CSN: [id number]     Arrival date & time    History   First MD Initiated Contact with Patient 07/01/14 1530     Chief Complaint  Patient presents with  . Medicare Wellness     (Consider location/radiation/quality/duration/timing/severity/associated sxs/prior Treatment) HPI Comments: Pt comes in with cc of weakness, seizures. PT has hx of iddm, minigioma s/p resection, chf, strokes, seizures. Per daughter, pt's blood glucose is staying elevated since Thursday, which is unusual. In addition, pt is feeling weak, not as active and not eating as well. There is no emesis, diarrhea, cough, known fevers, falls. Grand daughter also states that pt is having "staring episodes." Her usual seizures are absent type seizures, but the current episodes she is not making the topical grunting noise that she makes with seizures.    ROS 10 Systems reviewed and are negative for acute change except as noted in the HPI.     The history is provided by the patient.    Past Medical History  Diagnosis Date  . Diabetes mellitus 2007    HgA1C (02/20/2010) = 9.2, HgA1C (03/20/2009) = 12.1  . CVA (cerebrovascular accident) 4098     right embolic stroke, no residual deficits  . Diverticulitis   . CVA (cerebral infarction) 7-yrs ago  . Arthritis   . Dysphagia   . CHF (congestive heart failure)   . DIABETES MELLITUS, TYPE II 12/04/2005  . HYPERLIPIDEMIA 12/04/2005  . HYPERTENSION 12/04/2005  . GERD 12/04/2005  . ARTHRITIS, KNEE 03/24/2006  . Lung mass 07/08/2010  . Meningioma 07/21/2010  . Hemangioma of liver 12/02/2010  . Depression 12/02/2010  . Adenocarcinoma of lung      Right upper lobe adenocarcinoma. s/p right lower lobectomy 12/15/10  . Brain cancer     7 cm cerebral right atypical meningoma grade II   Past Surgical History  Procedure Laterality Date  . Abdominal hysterectomy    . Video bronchoscope.  12/29/2007    [place]  . Wide excision of left upper back mass.    . Extracapsular cataract  extraction with intraocular      lens implantation.  . Right vats,right thoracotomy,right lower lobectomy with node dissection    . Incision and drainage perirectal abscess N/A 01/19/2013    Procedure: IRRIGATION AND DEBRIDEMENT PERIRECTAL ABSCESS;  Surgeon: Zenovia Jarred, MD;  Location: [facility];  Service: General;  Laterality: N/A;  . Craniotomy N/A 03/02/2013    Procedure: CRANIOTOMY TUMOR EXCISION;  Surgeon: Winfield Cunas, MD;  Location: [facility];  Service: Neurosurgery;  Laterality: N/A;  Bifrontal Craniotomy for tumor   Family History  Problem Relation Age of Onset  . Hyperlipidemia Brother   . Hypertension Brother   . Diabetes Brother    History  Substance Use Topics  . Smoking status: Former Smoker    Types: Cigarettes    Quit date: 02/17/2000  . Smokeless tobacco: Never Used  . Alcohol Use: No   OB History    No data available     Review of Systems  Constitutional: Positive for activity change and fatigue.  Neurological: Positive for weakness.  All other systems reviewed and are negative.     Allergies  Review of patient's allergies indicates no known allergies.  Home Medications   Prior to Admission medications   Medication Sig Start Date End Date Taking? Authorizing Provider  acetaminophen-codeine (TYLENOL #3) 300-30 MG per tablet Take 1 tablet by mouth every 6 (six) hours as needed for moderate pain. 06/07/14  Yes  Lucious Groves, DO  albuterol (PROVENTIL HFA;VENTOLIN HFA) 108 (90 BASE) MCG/ACT inhaler Inhale 2 puffs into the lungs every 4 (four) hours as needed for wheezing. 01/03/13  Yes Wilber Oliphant, MD  albuterol (PROVENTIL) (2.5 MG/3ML) 0.083% nebulizer solution Take 3 mLs (2.5 mg total) by nebulization every 6 (six) hours as needed for wheezing. 11/03/11  Yes Ansel Bong, MD  aspirin EC 81 MG tablet Take 81 mg by mouth daily.   Yes Historical Provider, MD  carvedilol (COREG) 25 MG tablet Take 1 tablet (25 mg total) by mouth 2 (two) times daily with  a meal. 03/06/14  Yes Langley Gauss Moding, MD  FERREX 150 150 MG capsule TAKE 1 CAPSULE BY MOUTH DAILY. 05/21/14  Yes Wilber Oliphant, MD  FLUoxetine (PROZAC) 10 MG capsule TAKE 1 CAPSULE (10 MG TOTAL) BY MOUTH AT BEDTIME. 06/12/14  Yes Wilber Oliphant, MD  glucose blood (ACCU-CHEK AVIVA) test strip Use to Check Blood Sugars 3 Times Daily.Dx Code: 250.00. Insulin dependent 11/09/13  Yes Annia Belt, MD  insulin aspart (NOVOLOG FLEXPEN) 100 UNIT/ML FlexPen Novolog Insulin: 91 - 160 5 units 161- 230 7 units 231-300 9 units 301- 370 11 units 371 - 440 13 units 441 - 510 15 units 02/01/14  Yes Wilber Oliphant, MD  Insulin Glargine (LANTUS SOLOSTAR) 100 UNIT/ML Solostar Pen Inject 17 Units into the skin at bedtime. 12/11/13  Yes Wilber Oliphant, MD  levETIRAcetam (KEPPRA) 750 MG tablet Take 1 tablet (750 mg total) by mouth 2 (two) times daily. 02/07/14  Yes Oval Linsey, MD  omeprazole (PRILOSEC) 20 MG capsule Take 1 capsule (20 mg total) by mouth daily. 12/11/13  Yes Wilber Oliphant, MD  simvastatin (ZOCOR) 20 MG tablet Take 20 mg by mouth daily.   Yes Historical Provider, MD  guaiFENesin-dextromethorphan (ROBITUSSIN DM) 100-10 MG/5ML syrup Take 5 mLs by mouth every 4 (four) hours as needed for cough. Patient not taking: Reported on 07/01/2014 03/23/14   Wilber Oliphant, MD   BP 154/58 mmHg  Pulse 87  Temp(Src) 99.2 F (37.3 C) (Oral)  Resp 12  SpO2 100% Physical Exam  Constitutional: She is oriented to person, place, and time. She appears well-developed and well-nourished.  HENT:  Head: Normocephalic and atraumatic.  Eyes: EOM are normal.  Unequal pupil, L pupil is wide and irregularly shaped  Neck: Neck supple.  Cardiovascular: Normal rate, regular rhythm and normal heart sounds.   Pulmonary/Chest: Effort normal. No respiratory distress.  Abdominal: Soft. She exhibits no distension. There is no tenderness. There is no rebound and no guarding.  Neurological: She is  alert and oriented to person, place, and time. No cranial nerve deficit.  Sensory exam normal for bilateral upper and lower extremities - and patient is able to discriminate between sharp and dull. Motor exam is 4+/5   Skin: Skin is warm and dry.  Nursing note and vitals reviewed.   ED Course  Procedures (including critical care time) Labs Review Labs Reviewed  URINE CULTURE  CBC WITH DIFFERENTIAL/PLATELET  BASIC METABOLIC PANEL  URINALYSIS, ROUTINE W REFLEX MICROSCOPIC  TROPONIN I    Imaging Review No results found.   EKG Interpretation None      '@6'$ :00 We ordered a rectal temp, which is elevated. Pt also seems to be having uti, and we suspect that she is sluggish due to her uti, She is still not confused, and no other SIRs criteria - and so not septic. Pt lives with daughter and  grand-daughter, and thus close supervision. Strict return precautions discussed with the family. Also, pt has mild hyperglycemia, with no anion gap. We didn't order lactate, as she had no SIrs at arrival, she has received 1 liter ivf.  MDM   Final diagnoses:  Weakness  Weakness    Pt comes in with cc of generalized weakness and seizure like spells. She is also having elevated blood glucose. Non focal neuro exam and no clinical evidence of infection. She is aox3 for me, but does appear slightly sluggish. 0 SIRs criteria at arrival  DDx includes: ICH/Stroke ACS Sepsis syndrome Infection - UTI/Pneumonia Electrolyte abnormality Drug overdose DKA Seizure disorder   Varney Biles, MD 07/01/14 [place], MD 07/01/14 [id number]

## 2014-07-01 NOTE — ED Notes (Signed)
Pt off unit with xray 

## 2014-07-01 NOTE — ED Notes (Addendum)
Pt brought from home by [facility] due to her family's concern that she had an unwitnessed seizure.  Pt denies having seizure and has no complaints, denies any problems.  Pt states she was in bed late ( and does that often) and was just starring at the nail on the wall.  Pt has smell of urine and her depends is drenched in urine.  Pt states she lives with her daughter.  Pt a&O x4.

## 2014-07-01 NOTE — ED Notes (Signed)
Left with daughter at this time.

## 2014-07-01 NOTE — ED Notes (Signed)
PT monitored by pulse ox, bp cuff, and 5-lead. 

## 2014-07-01 NOTE — Discharge Instructions (Signed)
Ms. Alcoser has a urinary infection, and we suspect that her generalized weakness and malaise are due to the infection. With the antibiotics, we expect her to slowly recover. Please have her see her primary doctor in 3 days.  Please return to the ER if your symptoms worsen; you have increased pain, fevers, chills, inability to keep any medications down, confusion. Otherwise see the outpatient doctor as requested.   Urinary Tract Infection Urinary tract infections (UTIs) can develop anywhere along your urinary tract. Your urinary tract is your body's drainage system for removing wastes and extra water. Your urinary tract includes two kidneys, two ureters, a bladder, and a urethra. Your kidneys are a pair of bean-shaped organs. Each kidney is about the size of your fist. They are located below your ribs, one on each side of your spine. CAUSES Infections are caused by microbes, which are microscopic organisms, including fungi, viruses, and bacteria. These organisms are so small that they can only be seen through a microscope. Bacteria are the microbes that most commonly cause UTIs. SYMPTOMS  Symptoms of UTIs may vary by age and gender of the patient and by the location of the infection. Symptoms in young women typically include a frequent and intense urge to urinate and a painful, burning feeling in the bladder or urethra during urination. Older women and men are more likely to be tired, shaky, and weak and have muscle aches and abdominal pain. A fever may mean the infection is in your kidneys. Other symptoms of a kidney infection include pain in your back or sides below the ribs, nausea, and vomiting. DIAGNOSIS To diagnose a UTI, your caregiver will ask you about your symptoms. Your caregiver also will ask to provide a urine sample. The urine sample will be tested for bacteria and white blood cells. White blood cells are made by your body to help fight infection. TREATMENT  Typically, UTIs can be treated  with medication. Because most UTIs are caused by a bacterial infection, they usually can be treated with the use of antibiotics. The choice of antibiotic and length of treatment depend on your symptoms and the type of bacteria causing your infection. HOME CARE INSTRUCTIONS  If you were prescribed antibiotics, take them exactly as your caregiver instructs you. Finish the medication even if you feel better after you have only taken some of the medication.  Drink enough water and fluids to keep your urine clear or pale yellow.  Avoid caffeine, tea, and carbonated beverages. They tend to irritate your bladder.  Empty your bladder often. Avoid holding urine for long periods of time.  Empty your bladder before and after sexual intercourse.  After a bowel movement, women should cleanse from front to back. Use each tissue only once. SEEK MEDICAL CARE IF:   You have back pain.  You develop a fever.  Your symptoms do not begin to resolve within 3 days. SEEK IMMEDIATE MEDICAL CARE IF:   You have severe back pain or lower abdominal pain.  You develop chills.  You have nausea or vomiting.  You have continued burning or discomfort with urination. MAKE SURE YOU:   Understand these instructions.  Will watch your condition.  Will get help right away if you are not doing well or get worse. Document Released: 11/12/2004 Document Revised: 08/04/2011 Document Reviewed: 03/13/2011 [facility] Patient Information 2015 [place], [place]. This information is not intended to replace advice given to you by your health care provider. Make sure you discuss any questions you have with  your health care provider.

## 2014-07-02 ENCOUNTER — Other Ambulatory Visit: Payer: Self-pay | Admitting: Internal Medicine

## 2014-07-02 ENCOUNTER — Telehealth: Payer: Self-pay | Admitting: Internal Medicine

## 2014-07-02 DIAGNOSIS — E1165 Type 2 diabetes mellitus with hyperglycemia: Secondary | ICD-10-CM

## 2014-07-02 DIAGNOSIS — IMO0002 Reserved for concepts with insufficient information to code with codable children: Secondary | ICD-10-CM

## 2014-07-02 MED ORDER — INSULIN GLARGINE 100 UNIT/ML SOLOSTAR PEN: 17.0000 [IU] | PEN_INJECTOR | Freq: Every day | SUBCUTANEOUS | Status: DC

## 2014-07-02 NOTE — Telephone Encounter (Signed)
Reason for call:   I received a call from [place], daughter of [name] at 10:09 PM indicating that her mother nearly fell while walking to the bathroom tonight and she is concerned she is having seizures.  [name] explains that she brought her mother to the ED yesterday for similar complaints of possible seizures, and she was found to have a UTI and prescribed keflex. She says she has been giving her the doses as prescribed and today, [name] was more responsive and active than before, but this evening when she was going to the bathroom for a bath she got weak and almost fell but they caught her.    Pertinent Data:   [name] reports her mom to have "staring spells" where her lips get tight and she doesn't answer for a short time and then starts talking.   [name] has been lethargic lately but more responsive today and actually nibbled on some food as well than before.   She was started on keflex and has been compliant with the medication.  [name] did not get any keppra yesterday and threw up her dose from the day before, however, did take it today along with the rest of her medications.  [name] also reports her to be out of Lantus and that the prescription was apparently sent to a provider by a pharmacy (I did not receive any refill request) and that she has just been giving her novolog. CBG tonight before calling was 443 and they gave her 15 units per their home novolog sliding scale.   [name] is concerned for her mother but says she is refusing to come to the ED with her tonight.   Daughter also reports that her daughter noted a bump in vaginal area on [name] and some vaginal swelling as well.   U/A 5/15: moderate Hb, glucose >1000, mod leukocytes with 11-20 wbc and urine cx with 80,000 colonies Ecoli. She was prescribed keflex '500mg'$  bid.   Assessment / Plan / Recommendations:   [name] is a 81 year old female with hx of Meningioma, DM2, and seizure disorder whose  daughter called in today concerned for her mother having seizures and weakness and hypeglycemia.  She has recently missed doses of keppra and was recently started on Keflex for UTI. Of note, Keflex may increase risk of seizures.   It certainly is possible that [name] is having seizures and as such, I have recommended for [name] to bring her back to the hospital at this time for further evaluation.  She said [name] is currently refusing but she will try talking to her again or will call EMS. Additionally, a UTI could also be contributing to her weakness and not feeling well.   In the meantime, given that they are out of Lantus, I will refill her Lantus at the 24 hour pharmacy she requested (refill request appears to be pended with Dr. Naaman Plummer). [name] is prescribed 17 units of lantus qhs. Given that she has already received 15 units tonight, I have recommended cbg checks q1hr for the next 3-4 hours to see if trending down. If sugars are trending down to <200-300s she can hold lantus tonight and restart tomorrow if eating. If cbgs persistently in high in 400s, she could give her 5 units of Lantus tonight and continue to monitor cbg's for any hypoglycemia.  She thinks she got weak maybe after the fluoxetine dose that was given later than usual tonight, as such,  they can hold the fluoxetine for now until further evaluation. Fluoxetine could also contribute to seizures although she has been on this medication for quite sometime.   If she cannot for whatever reason get her to the hospital tonight, I have recommended for her to be seen in opc tomorrow morning and will let the front desk know as well. [name] is hopeful that she will be able to convince her to get evaluated tonight.  As always, pt is advised that if symptoms worsen or new symptoms arise, they should go to an urgent care facility or to to ER for further evaluation.   Wilber Oliphant, MD   07/02/2014, 10:16 PM

## 2014-07-03 ENCOUNTER — Ambulatory Visit (INDEPENDENT_AMBULATORY_CARE_PROVIDER_SITE_OTHER): Payer: Medicare Other | Admitting: Internal Medicine

## 2014-07-03 ENCOUNTER — Encounter: Payer: Self-pay | Admitting: Internal Medicine

## 2014-07-03 VITALS — BP 143/56 | HR 74 | Temp 98.2°F | Wt 204.2 lb

## 2014-07-03 DIAGNOSIS — I951 Orthostatic hypotension: Secondary | ICD-10-CM

## 2014-07-03 DIAGNOSIS — E11329 Type 2 diabetes mellitus with mild nonproliferative diabetic retinopathy without macular edema: Secondary | ICD-10-CM

## 2014-07-03 LAB — URINE CULTURE: Colony Count: 80000

## 2014-07-03 LAB — BASIC METABOLIC PANEL
Anion gap: 11 (ref 5–15)
BUN: 21 mg/dL — AB (ref 6–20)
CALCIUM: 8.2 mg/dL — AB (ref 8.9–10.3)
CO2: 27 mmol/L (ref 22–32)
CREATININE: 1.05 mg/dL — AB (ref 0.44–1.00)
Chloride: 99 mmol/L — ABNORMAL LOW (ref 101–111)
GFR calc non Af Amer: 50 mL/min — ABNORMAL LOW (ref >60)
GFR, EST AFRICAN AMERICAN: 57 mL/min — AB (ref >60)
Glucose, Bld: 327 mg/dL — ABNORMAL HIGH (ref 65–99)
Potassium: 3.4 mmol/L — ABNORMAL LOW (ref 3.5–5.1)
Sodium: 137 mmol/L (ref 135–145)

## 2014-07-03 LAB — POCT GLYCOSYLATED HEMOGLOBIN (HGB A1C): HEMOGLOBIN A1C: 10.3

## 2014-07-03 LAB — GLUCOSE, CAPILLARY: Glucose-Capillary: 289 mg/dL — ABNORMAL HIGH (ref 65–99)

## 2014-07-03 MED ORDER — SODIUM CHLORIDE 0.9 % IV BOLUS (SEPSIS)
1000.0000 mL | Freq: Once | INTRAVENOUS | Status: AC
  Administered 2014-07-03: 1000 mL via INTRAVENOUS

## 2014-07-03 NOTE — Patient Instructions (Signed)
Make sure to increase your intake of fluids and food. You can buy Ensure from the grocery store if she is not eating solids. Make sure to get up slowly from a laying or seated position.     Orthostatic Hypotension Orthostatic hypotension is a sudden drop in blood pressure. It happens when you quickly stand up from a seated or lying position. You may feel dizzy or light-headed. This can last for just a few seconds or for up to a few minutes. It is usually not a serious problem. However, if this happens frequently or gets worse, it can be a sign of something more serious. CAUSES  Different things can cause orthostatic hypotension, including:   Loss of body fluids (dehydration).  Medicines that lower blood pressure.  Sudden changes in posture, such as standing up quickly after you have been sitting or lying down.  Taking too much of your medicine. SIGNS AND SYMPTOMS   Light-headedness or dizziness.   Fainting or near-fainting.   A fast heart rate.   Weakness.   Feeling tired (fatigue).  DIAGNOSIS  Your health care provider may do several things to help diagnose your condition and identify the cause. These may include:   Taking a medical history and doing a physical exam.  Checking your blood pressure. Your health care provider will check your blood pressure when you are:  Lying down.  Sitting.  Standing.  Using tilt table testing. In this test, you lie down on a table that moves from a lying position to a standing position. You will be strapped onto the table. This test monitors your blood pressure and heart rate when you are in different positions. TREATMENT  Treatment will vary depending on the cause. Possible treatments include:   Changing the dosage of your medicines.  Wearing compression stockings on your lower legs.  Standing up slowly after sitting or lying down.  Eating more salt.  Eating frequent, small meals.  In some cases, getting IV  fluids.  Taking medicine to enhance fluid retention. HOME CARE INSTRUCTIONS  Only take over-the-counter or prescription medicines as directed by your health care provider.  Follow your health care provider's instructions for changing the dosage of your current medicines.  Do not stop or adjust your medicine on your own.  Stand up slowly after sitting or lying down. This allows your body to adjust to the different position.  Wear compression stockings as directed.  Eat extra salt as directed.  Do not add extra salt to your diet unless directed to by your health care provider.  Eat frequent, small meals.  Avoid standing suddenly after eating.  Avoid hot showers or excessive heat as directed by your health care provider.  Keep all follow-up appointments. SEEK MEDICAL CARE IF:  You continue to feel dizzy or light-headed after standing.  You feel groggy or confused.  You feel cold, clammy, or sick to your stomach (nauseous).  You have blurred vision.  You feel short of breath. SEEK IMMEDIATE MEDICAL CARE IF:   You faint after standing.  You have chest pain.  You have difficulty breathing.   You lose feeling or movement in your arms or legs.   You have slurred speech or difficulty talking, or you are unable to talk.  MAKE SURE YOU:   Understand these instructions.  Will watch your condition.  Will get help right away if you are not doing well or get worse. Document Released: 01/23/2002 Document Revised: 02/07/2013 Document Reviewed: 11/25/2012 [employer] Patient Information  2015 [employer]. This information is not intended to replace advice given to you by your health care provider. Make sure you discuss any questions you have with your health care provider.  

## 2014-07-04 ENCOUNTER — Telehealth (HOSPITAL_BASED_OUTPATIENT_CLINIC_OR_DEPARTMENT_OTHER): Payer: Self-pay | Admitting: Emergency Medicine

## 2014-07-04 ENCOUNTER — Other Ambulatory Visit: Payer: Self-pay | Admitting: Internal Medicine

## 2014-07-04 ENCOUNTER — Telehealth: Payer: Self-pay | Admitting: *Deleted

## 2014-07-04 NOTE — Telephone Encounter (Signed)
Daughter called - problems with constipation.  In the bathroom now for BM. Can try fleets enema , glycerin supp or dry prunes if occurs rare. If constipation is an on going problem - suggest to talk next clinic appt. Hilda Blades Jmya Uliano RN 07/04/14 3:45PM

## 2014-07-04 NOTE — Assessment & Plan Note (Addendum)
Last night pt was walking to the bathroom and then slowly fell down. Her family members caught her and she never hit her head on the ground. Pt was arousable however family members had to call her name out 2-3 time before she would respond to them. Pt did not have loss of bowel movements or urinary incontinence during the fall, nor tongue biting or body shaking. They brought her to the bed and called the on call pager and had a lot of concerns, her CBG was elevated, she was out of lantus, and she was not taking her keppra. She was advised to go to the ED however pt refused and she was brought into clinic this morning. Her lantus was sent to a 24 hour pharmacy which pt picked up, however she was instructed to wait until night of 5/17 to take evening lantus dose. Of note pt was recently seen in the ED on 5/15 for "acting different" and pt was found to have a UTI and was started on Keflex BID x 10 days. Urine culture reveal Ecoli that is pansensitive.  Daughter states that pt has had decreased po intake x 3 days due to loss of appetite, denies any n/v or diarrhea. On exam pt had positive orthostatic vitals, she was given 1L of NS IVFs in the clinic and stat BMET was drawn. Creatinine was stable and glucose was 327. Orthostatic vitals were rechecked after fluid administration and were still positive. However, pt is able to keep down fluids, not having any diarrhea, and can have close follow up in clinic in 3 days. Thus, family and patient were instructed to increase pt's fluid intake, advised they can buy OTC ensure or boost if pt is not tolerating solids.   -Pt likely experienced orthostatic hypotension last night causing her to slowly fall down due to decreased po intake from generalized malaise from UTI. Could also have been a seizure as she was not compliant with keppra and was recently started on keflex which lowers seizure threshold. However pt did not have any tongue biting or shaking, loss of urine or BM, and  did not have any post ictal confusion. Pt and family agreeable to plan and will f/u in clinic on Friday during which time will reassess volume status.

## 2014-07-04 NOTE — Telephone Encounter (Signed)
Post ED Visit - Positive Culture Follow-up  Culture report reviewed by antimicrobial stewardship pharmacist: '[]'$  Wes Dulaney, Pharm.D., BCPS '[]'$  Heide Guile, Pharm.D., BCPS '[]'$  Alycia Rossetti, Pharm.D., BCPS '[]'$  [place], Pharm.D., BCPS, AAHIVP '[]'$  Legrand Como, Pharm.D., BCPS, AAHIVP '[]'$  Isac Sarna, Pharm.D., BCPS Tegan Magsam, Pharm. D.  Positive urine culture E. Coli Treated with cephalexin, organism sensitive to the same and no further patient follow-up is required at this time.  Hazle Nordmann 07/04/2014, 9:36 AM

## 2014-07-04 NOTE — Telephone Encounter (Signed)
I just recently filled her Lantus at [facility] 24 hour pharmacy.

## 2014-07-04 NOTE — Progress Notes (Signed)
   Subjective:    Patient ID: [name], female    DOB: Jan 19, 1934, 81 y.o.   MRN: [id number]  HPI Pt is a 81 y/o female w/ PMHx of HTN, CHF ( ECHO 60-65% 2015), hx of orthostatic hypotension, and seizure d/o s/p meningoma removal who presents to clinic for syncopal episode yesterday. Please see problem list for further details.     Review of Systems  Constitutional: Positive for activity change, appetite change (decreased) and fatigue. Negative for fever and chills.  Eyes: Negative for visual disturbance.  Respiratory: Negative for shortness of breath.   Cardiovascular: Negative for chest pain.  Gastrointestinal: Negative for nausea, vomiting and diarrhea.  Genitourinary: Negative for dysuria, urgency, frequency, hematuria, flank pain, vaginal discharge, vaginal pain and pelvic pain.  Neurological: Positive for light-headedness. Negative for seizures.  Psychiatric/Behavioral: Negative for sleep disturbance.       Objective:   Physical Exam  Constitutional: She appears well-developed and well-nourished. No distress.  HENT:  Head: Normocephalic.  Eyes: Conjunctivae and EOM are normal.  Cardiovascular: Normal rate and regular rhythm.   Pulmonary/Chest: Effort normal and breath sounds normal.  Abdominal: Soft. Bowel sounds are normal. There is no tenderness.  Genitourinary:  Vaginal area neg for rash, one furuncle on pubic area that is non erythematous. External vaginal exam WNL, neg for CVA tenderness, neg for enlarged inguinal lymph nodes  Skin: Skin is warm and dry.          Assessment & Plan:  Please see problem based assessment and plan.

## 2014-07-05 NOTE — Progress Notes (Signed)
Internal Medicine Clinic Attending  Case discussed with Dr. Hulen Luster at the time of the visit.  We reviewed the resident's history and exam and pertinent patient test results.  I agree with the assessment, diagnosis, and plan of care documented in the resident's note.  Given lab work stability and known acute infection, will treat at home and have close follow up.

## 2014-07-13 ENCOUNTER — Other Ambulatory Visit: Payer: Self-pay | Admitting: *Deleted

## 2014-07-13 NOTE — Telephone Encounter (Signed)
There is no Problem on the problem list nor dx on the Med hx that would necessitate alb neb. Therefore, I cannot refill and provide and ICD 10 code bc I do not know what the ICD 10 code would be. Dr Angelica Pou will be able to address after 7 PM tonight.

## 2014-07-13 NOTE — Telephone Encounter (Signed)
I tried calling [name] and [name] tonight multiple times on both numbers in chart with no answer and left a message for them to call back for further clarification. Looking back she had nebz ordered by other provider in the past with refills and then in December she had viral bronchitis with associated sob that improved with albuterol prn. If she is having new onset sob, she will likely need to be evaluated and associated with new diagnosis.  Thank you,  Dr q

## 2014-07-13 NOTE — Telephone Encounter (Signed)
SPOKE W/ PT'S DAUGHTER, she understands that dr [name] will answer hopefully tonight

## 2014-07-13 NOTE — Telephone Encounter (Signed)
Medicare now requires a diag code on script, pt is out and upset that it has not been filled, doses are a 3 month supply

## 2014-07-14 ENCOUNTER — Telehealth: Payer: Self-pay | Admitting: Internal Medicine

## 2014-07-14 DIAGNOSIS — C3491 Malignant neoplasm of unspecified part of right bronchus or lung: Secondary | ICD-10-CM

## 2014-07-14 DIAGNOSIS — R0602 Shortness of breath: Secondary | ICD-10-CM

## 2014-07-14 MED ORDER — ALBUTEROL SULFATE (2.5 MG/3ML) 0.083% IN NEBU
INHALATION_SOLUTION | RESPIRATORY_TRACT | Status: DC
Start: 2014-07-14 — End: 2014-10-06

## 2014-07-14 MED ORDER — ALBUTEROL SULFATE HFA 108 (90 BASE) MCG/ACT IN AERS: 2.0000 | INHALATION_SPRAY | RESPIRATORY_TRACT | Status: DC | PRN

## 2014-07-14 NOTE — Telephone Encounter (Signed)
Called about Albuterol nebulizer not being able to be filled. Daughter states they won't refill w/out ICD code. According to the daughter, she uses the nebs for her h/o RUL lung CA for wheezing and SOB. No known history of COPD or Asthma in the problem list from what I could see. Placed ICD 10 code in Rx refill for h/o adenocarcinoma; C34.90. Patient is not acutely short of breath but has some mild wheezing. Has rescue inhaler if needed. Will call the clinic on Monday if still having issues.   [name] Bence, MD

## 2014-07-14 NOTE — Telephone Encounter (Signed)
This ICD10 code for h/o adeno does not work apparently. I do not see a clear history of asthma or COPD or previous PFT's in her chart. Refilled the Albuterol inhaler for now. Patient will need to call the clinic for more assistance regarding insurance during business hours. Obviously instructed the daughter to call back or come to urgent care if she is actually having difficulty breathing or worsening of her wheezing.   Natasha Bence, MD

## 2014-07-19 ENCOUNTER — Ambulatory Visit: Payer: Medicare Other | Admitting: Internal Medicine

## 2014-07-20 ENCOUNTER — Encounter: Payer: Self-pay | Admitting: Internal Medicine

## 2014-07-20 ENCOUNTER — Telehealth: Payer: Self-pay | Admitting: Licensed Clinical Social Worker

## 2014-07-20 ENCOUNTER — Ambulatory Visit (INDEPENDENT_AMBULATORY_CARE_PROVIDER_SITE_OTHER): Payer: Medicare Other | Admitting: Internal Medicine

## 2014-07-20 VITALS — BP 203/81 | HR 68 | Temp 98.2°F | Wt 206.4 lb

## 2014-07-20 DIAGNOSIS — N3946 Mixed incontinence: Secondary | ICD-10-CM | POA: Diagnosis not present

## 2014-07-20 DIAGNOSIS — Z9181 History of falling: Secondary | ICD-10-CM

## 2014-07-20 DIAGNOSIS — Z794 Long term (current) use of insulin: Secondary | ICD-10-CM

## 2014-07-20 DIAGNOSIS — E11321 Type 2 diabetes mellitus with mild nonproliferative diabetic retinopathy with macular edema: Secondary | ICD-10-CM | POA: Diagnosis not present

## 2014-07-20 DIAGNOSIS — G40909 Epilepsy, unspecified, not intractable, without status epilepticus: Secondary | ICD-10-CM | POA: Diagnosis not present

## 2014-07-20 DIAGNOSIS — E11311 Type 2 diabetes mellitus with unspecified diabetic retinopathy with macular edema: Secondary | ICD-10-CM

## 2014-07-20 DIAGNOSIS — Z23 Encounter for immunization: Secondary | ICD-10-CM

## 2014-07-20 DIAGNOSIS — I5032 Chronic diastolic (congestive) heart failure: Secondary | ICD-10-CM

## 2014-07-20 DIAGNOSIS — W19XXXA Unspecified fall, initial encounter: Secondary | ICD-10-CM

## 2014-07-20 DIAGNOSIS — Z299 Encounter for prophylactic measures, unspecified: Secondary | ICD-10-CM

## 2014-07-20 DIAGNOSIS — Z7982 Long term (current) use of aspirin: Secondary | ICD-10-CM

## 2014-07-20 DIAGNOSIS — M1990 Unspecified osteoarthritis, unspecified site: Secondary | ICD-10-CM | POA: Diagnosis not present

## 2014-07-20 DIAGNOSIS — I10 Essential (primary) hypertension: Secondary | ICD-10-CM

## 2014-07-20 DIAGNOSIS — Z87891 Personal history of nicotine dependence: Secondary | ICD-10-CM | POA: Diagnosis not present

## 2014-07-20 DIAGNOSIS — Z85841 Personal history of malignant neoplasm of brain: Secondary | ICD-10-CM | POA: Diagnosis not present

## 2014-07-20 DIAGNOSIS — E1165 Type 2 diabetes mellitus with hyperglycemia: Secondary | ICD-10-CM | POA: Diagnosis not present

## 2014-07-20 DIAGNOSIS — Z86011 Personal history of benign neoplasm of the brain: Secondary | ICD-10-CM

## 2014-07-20 DIAGNOSIS — E113219 Type 2 diabetes mellitus with mild nonproliferative diabetic retinopathy with macular edema, unspecified eye: Secondary | ICD-10-CM

## 2014-07-20 MED ORDER — LISINOPRIL 10 MG PO TABS: 20.0000 mg | ORAL_TABLET | Freq: Every day | ORAL | Status: DC

## 2014-07-20 MED ORDER — GUAIFENESIN-DM 100-10 MG/5ML PO SYRP: 5.0000 mL | ORAL_SOLUTION | ORAL | Status: DC | PRN

## 2014-07-20 MED ORDER — ACETAMINOPHEN-CODEINE #3 300-30 MG PO TABS: 1.0000 | ORAL_TABLET | Freq: Three times a day (TID) | ORAL | Status: DC | PRN

## 2014-07-20 MED ORDER — PNEUMOCOCCAL 13-VAL CONJ VACC IM SUSP: 0.5000 mL | INTRAMUSCULAR | Status: DC

## 2014-07-20 NOTE — Progress Notes (Signed)
Internal Medicine Clinic Attending  Case discussed with Dr. Qureshi at the time of the visit.  We reviewed the resident's history and exam and pertinent patient test results.  I agree with the assessment, diagnosis, and plan of care documented in the resident's note. 

## 2014-07-20 NOTE — Telephone Encounter (Signed)
CSW received call from pt's daughter, Ms. Cummings.  Daughter states pt will be at appointment today and she will be attending as usual.  Daughter showed interest in referral to [facility] but would like to have more information.  CSW will leave brochure for daughter/pt and will request if [facility] hospital liaison can meet with pt/daughter during/following today's appointment.

## 2014-07-20 NOTE — Telephone Encounter (Signed)
[name] rescheduled her appointment on 07/19/14 for today 07/20/14.  CSW attempted to reach [name] by telephone, unable to leave message on preferred number.  Pt's appointment is after CSW hours, will request PCP to inquire about [name] and provide brochure to [name] during appointment.

## 2014-07-20 NOTE — Patient Instructions (Addendum)
Dear Ms. Coyne,  We can try increasing your tylenol #3 to up to three times a day as needed for pain  Please follow up with the Neurologist for your seizure's  Continue to work on your diabetes, return in august to meet with new doctor and get your sugars checked again. If your blood sugars are trending high or any low's please let us know right away [phone]  Please return to your eye doctor, i placed a new referral just in case  For the urine incontinence, we will try to have you seen by urology for further assitance  Sorry you fell today, apply ice to affected areas as tolerated and if you start having pain or swelling or trouble moving your arm or leg, call us right away or return to clinic as you may need xray's and further evaluation  We will check your blood work today, start lisinopril '20mg'$  for your blood pressure and return next week for repeat lab work and recheck of your blood pressure. If you start feeling dizzy when standing or light-headed since starting this medication stop use right away and call and let us know [phone]  We will also try to have [facility] touch base with you all.

## 2014-07-20 NOTE — Telephone Encounter (Signed)
Attempted to contact daughter, S. Cummings, mobile number on chart is incorrect.  Female states this number does not below to S. Cummings.

## 2014-07-21 DIAGNOSIS — N3946 Mixed incontinence: Secondary | ICD-10-CM | POA: Insufficient documentation

## 2014-07-21 NOTE — Assessment & Plan Note (Signed)
She likely has a mixed/combination urgency and functional incontinence and may have some stress incontinence however patient is unable to report today if she notices urination/dribbling with coughing or sneezing or pressure. Her daughter has her wearing pampers and they usually help her to the bathroom. [name] reports being able to feel when she has to urinate but cannot get to the bathroom in time at times. She is limited by her multiple c morbidities and also has knee pain at times that causes her to move slowly. ?if also overflow incontinence at times.   Will refer to urology at this time for further evaluation

## 2014-07-21 NOTE — Progress Notes (Signed)
Subjective:   Patient ID: [name] female   DOB: 09-28-1933 81 y.o.   MRN: [id number]  HPI: [name] is a 81 y.o. female with PMH as listed below returns today for BP follow up.   HTN and noted to have orthostatic hypotension on last visit which was treated with IVF in clinic and also she was noted to have a UTI which was also treated. [name], her daughter reports she appears to be doing much better since then. Patient denies any dizziness or headache. BP was elevated today despite taking her coreg with SBP in 200s.   Seizure disorder--compliant with keppra. Daughters feels like the staring episodes she reported in the past over the phone have improved since treatment for UTI and hydration, however given her history we discussed touching base with neurology again since she cannot recall the last time she went.   Macular edema and diabetic retinopathy--patient is well overdue for optho follow up. Was seeing Dr. Ricki Miller in the past but daughter says he has retired since then. On last optho note she was recommended to follow up with Dr. Anderson Malta. Will place that referral today.   S/p fall today on the way to clinic when getting out of the car. Daughter was present but did not see the fall and said it happened very quickly. Patient reports feeling okay with no major complaints at this time. She says she fell on her right side, landing slowly on her right arm and maybe abrasion to right knee. She denies hitting her head or LOC. She thinks she may have tripped.   Urinary incontinence--patient reports knowing when she has to urinate but cannot hold long enough. Daughter says it is somewhat improved since treatment for UTI but she still is having incontinence. She cannot tell if is is having frequency more than usual. Patient denies dysuria. We discussed urology referral today.   Daughter did wish to proceed with [facility] evaluation.   Requesting refill for tylenol #3 today, pain in knees  still present but daughter did not increase frequency of medication as recommended by Dr. Heber [place] in April.   DM2--did not bring meter today, denies hypoglycemic episodes.  Past Medical History  Diagnosis Date  . Diabetes mellitus 2007    HgA1C (02/20/2010) = 9.2, HgA1C (03/20/2009) = 12.1  . CVA (cerebrovascular accident) 7124     right embolic stroke, no residual deficits  . Diverticulitis   . CVA (cerebral infarction) 7-yrs ago  . Arthritis   . Dysphagia   . CHF (congestive heart failure)   . DIABETES MELLITUS, TYPE II 12/04/2005  . HYPERLIPIDEMIA 12/04/2005  . HYPERTENSION 12/04/2005  . GERD 12/04/2005  . ARTHRITIS, KNEE 03/24/2006  . Lung mass 07/08/2010  . Meningioma 07/21/2010  . Hemangioma of liver 12/02/2010  . Depression 12/02/2010  . Adenocarcinoma of lung      Right upper lobe adenocarcinoma. s/p right lower lobectomy 12/15/10  . 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 acetaminophen-codeine (TYLENOL #3) 300-30 MG per tablet Take 1 tablet by mouth every 8 (eight) hours as needed for moderate pain. 90 tablet 0  . albuterol (PROVENTIL HFA;VENTOLIN HFA) 108 (90 BASE) MCG/ACT inhaler Inhale 2 puffs into the lungs every 4 (four) hours as needed for wheezing. 3 Inhaler 1  . aspirin EC 81 MG tablet Take 81 mg by mouth daily.    . carvedilol (COREG)  25 MG tablet Take 1 tablet (25 mg total) by mouth 2 (two) times daily with a meal. 180 tablet 4  . FERREX 150 150 MG capsule TAKE 1 CAPSULE BY MOUTH DAILY. 30 capsule 3  . FLUoxetine (PROZAC) 10 MG capsule TAKE 1 CAPSULE (10 MG TOTAL) BY MOUTH AT BEDTIME. 30 capsule 1  . glucose blood (ACCU-CHEK AVIVA) test strip Use to Check Blood Sugars 3 Times Daily.Dx Code: 250.00. Insulin dependent 100 each 12  . insulin aspart (NOVOLOG FLEXPEN) 100 UNIT/ML FlexPen Novolog Insulin: 91 - 160 5 units 161- 230 7 units 231-300 9 units 301- 370  11 units 371 - 440 13 units 441 - 510 15 units 15 mL 11  . Insulin Glargine (LANTUS SOLOSTAR) 100 UNIT/ML Solostar Pen Inject 17 Units into the skin at bedtime. 10 mL 2  . levETIRAcetam (KEPPRA) 750 MG tablet Take 1 tablet (750 mg total) by mouth 2 (two) times daily. 60 tablet 11  . omeprazole (PRILOSEC) 20 MG capsule Take 1 capsule (20 mg total) by mouth daily. 30 capsule 3  . simvastatin (ZOCOR) 20 MG tablet Take 20 mg by mouth daily.    Marland Kitchen albuterol (PROVENTIL) (2.5 MG/3ML) 0.083% nebulizer solution USE 1 NEBULE EVERY 6 HOURS AS NEEDED FOR WHEEZING. ICD10: C34.90 (Patient not taking: Reported on 07/20/2014) 75 mL 0  . cephALEXin (KEFLEX) 500 MG capsule Take 1 capsule (500 mg total) by mouth 2 (two) times daily. (Patient not taking: Reported on 07/20/2014) 20 capsule 0  . guaiFENesin-dextromethorphan (ROBITUSSIN DM) 100-10 MG/5ML syrup Take 5 mLs by mouth every 4 (four) hours as needed for cough. 118 mL 0  . lisinopril (PRINIVIL,ZESTRIL) 10 MG tablet Take 2 tablets (20 mg total) by mouth daily. 60 tablet 0   No current facility-administered medications for this visit.   Family History  Problem Relation Age of Onset  . Hyperlipidemia Brother   . Hypertension Brother   . Diabetes Brother    History   Social History  . Marital Status: Widowed    Spouse Name: N/A  . Number of Children: N/A  . Years of Education: 10   Occupational History  .  Unemployed   Social History Main Topics  . Smoking status: Former Smoker    Types: Cigarettes    Quit date: 02/17/2000  . Smokeless tobacco: Never Used  . Alcohol Use: No  . Drug Use: No  . Sexual Activity: Not Currently   Other Topics Concern  . None   Social History Narrative   Patient requests to use [employer] for her diabets testing supplies as of 09/05/2009.   Review of Systems:  Constitutional:  Denies fever, chills  HEENT:  Hx of macular edema  Respiratory:  Denies SOB  Cardiovascular:  Denies chest pain    Gastrointestinal:  Denies nausea, vomiting, abdominal pain  Genitourinary:  Denies dysuria. +incontinence  Musculoskeletal:  S/p fall today, chronic knee pain  Skin:  Abrasion to right knee  Neurological:  Denies headaches.    Objective:  Physical Exam: Filed Vitals:   07/20/14 1623  BP: 203/81  Pulse: 68  Temp: 98.2 F (36.8 C)  TempSrc: Oral  Weight: 206 lb 6.4 oz (93.622 kg)  SpO2: 97%   Vitals reviewed. General: sitting in wheelchair, NAD HEENT:NCAT Cardiac: RRR Pulm: clear to auscultation bilaterally, no wheezes, rales, or rhonchi Abd: soft, BS present Ext: moving all extremities, slight abrasion to left knee with no active bleeding, no edema or tenderness to palpation of right arm, knee,  or leg Neuro: alert and oriented X3, following commands and responding to questions appropriately, strength equal but with poor effort in b/l upper extremities and lowe extremities, good grip strength. Able to stand slowly and wish assistance. Once standing stable and denied dizziness.   Assessment & Plan:  Discussed with Dr. Meriel Flavors bmet Start acei Urology referral Re-referral for neurology and optho

## 2014-07-21 NOTE — Assessment & Plan Note (Addendum)
BP Readings from Last 3 Encounters:  07/20/14 203/81  07/03/14 143/56  07/01/14 199/78   Lab Results  Component Value Date   NA 138 07/20/2014   K 5.4* 07/20/2014   CREATININE 0.99 07/20/2014   Assessment: Blood pressure control:  elevated Progress toward BP goal:   deteriorated Comments: patient did fall before coming in today which could be contributing to BP elevation. BP remained elevated during visit on repeat as well. Rechecked orthostatics which were positive although patient was asymptomatic. Lying 208/82 hr 64 and standing 177/75 hr 67. Patient denied headaches or dizziness today. Has been on ACEi, CCB, and diuretics in the past but CCB was stopped as she was on multiple medications and may have been contributing to lower extremity edema. ACEI and diuretics were held given AKI during hospital admission and not restarted given BP was controlled on recheck on mono-therapy with BB  Plan: Medications: continue coreg '25mg'$  bid and will restart ACEi lisinopril (start with '10mg'$  per daughter's request and will likely increase to total '20mg'$  daily) in the following weeks Other plans: bmet today shows improvement in Cr <1, K slightly elevated at 5.4 but noted to have some hemolysis that could be attributing to that. We will have to monitor closely for any worsening of orthostasis or associated symptoms. Favor not restarting diuretic at this time given incontinence. She tolerated ACEi in the past but did have some AKI, however with much improved renal function and uncontrolled diabetes, will restart lisinopril at this time and monitor closely. Advised daughter and patient to monitor for possible dizziness fatigue when starting ACEi with BB and to stop use immediately if she experiences these symptoms or not feeling well. Additionally, she is cautioned on alarm symptoms with high blood pressure including but not limited to headaches, chest pain, sob, sudden weakness and stroke like symptoms where she  need to go to ED right away.  Follow up next week for BP check and repeat lab work [facility] referral placed to see if they can help with BP monitoring at home as well

## 2014-07-21 NOTE — Assessment & Plan Note (Signed)
[address] on the way to clinic today when stepping out of car. Daughter thinks she may have tripped and happened very fast. Patient denies loc or hitting head. She says she fell towards right side with her arm breaking the fall and right knee with a mild abrasion. She denies pain at this time or any bleeding.   Daughter is primary caretaker who assists patient most of the time and works on fall precautions [facility]  Referral will be placed upon discussion with social work to try to see what other resources if any we can use to prevent future falls

## 2014-07-21 NOTE — Assessment & Plan Note (Signed)
Daughter previously reported staring spells which she says have improved however has not seen neurology for quite sometime.   Neurology referral placed today Daughter will also try to call Dr. Alphonzo Dublin' office from neuro surgery since Ms. Hohman's insurance is now corrected

## 2014-07-21 NOTE — Assessment & Plan Note (Signed)
Daughter did not increase tylenol #3 frequency as previously recommended by Dr. Heber Palermo and says the pain currently is not as well controlled with bid dosing. [name] also fell today but denies any worsening of her pain.   -agree with increase frequency of tylenol #3 to at least TID prn for now to see if any improvement in pain -prescription provided today, if we able to achieve a stable dose for pain control, would recommend pain contract

## 2014-07-21 NOTE — Assessment & Plan Note (Signed)
prevnar today Overdue for eye exam--referral placed today

## 2014-07-21 NOTE — Assessment & Plan Note (Signed)
optho referral placed today Follow up with Dr. Anderson Malta

## 2014-07-21 NOTE — Assessment & Plan Note (Signed)
>>  ASSESSMENT AND PLAN FOR MIXED INCONTINENCE WRITTEN ON 07/21/2014  3:40 AM BY Baltazar ApoQURESHI, SAMAYA J, MD  She likely has a mixed/combination urgency and functional incontinence and may have some stress incontinence however patient is unable to report today if she notices urination/dribbling with coughing or sneezing or pressure. Her daughter has her wearing pampers and they usually help her to the bathroom. [name] reports being able to feel when she has to urinate but cannot get to the bathroom in time at times. She is limited by her multiple c morbidities and also has knee pain at times that causes her to move slowly. ?if also overflow incontinence at times.   Will refer to urology at this time for further evaluation

## 2014-07-21 NOTE — Assessment & Plan Note (Signed)
Daughter advised to follow up with Dr. Christella Noa, she says when there was an issue with Ms Norem's insurance she could not afford to pay out of pocket but now that her insurance is corrected she will try to re-establish

## 2014-07-21 NOTE — Assessment & Plan Note (Addendum)
Lab Results  Component Value Date   HGBA1C 10.3 07/03/2014   HGBA1C 7.4 03/23/2014   HGBA1C 7.6 12/26/2013    Assessment: Diabetes control:  poor control Progress toward A1C goal:   a1c >9 Comments: did not bring meter today, daughter denies any hypoglycemic episodes.   Plan: Medications:  Continue lantus 17units and sliding scale novolog.  Home glucose monitoring: Frequency:  TID Timing:  with meals Instruction/counseling given: reminded to get eye exam, reminded to bring blood glucose meter & log to each visit and reminded to bring medications to each visit Other plans: follow up on next visit in one week and thn referral placed today. Encouraged daughter to please bring her meter to follow up visit. Daughter does report that she has cut down on giving her mother soda's from what she was doing before.  Optho referral placed to follow up with Dr. Tish Men a1c august

## 2014-07-23 ENCOUNTER — Telehealth: Payer: Self-pay | Admitting: *Deleted

## 2014-07-23 LAB — BASIC METABOLIC PANEL WITH GFR
CO2: 21 mEq/L (ref 19–32)
Chloride: 103 mEq/L (ref 96–112)
Creat: 0.99 mg/dL (ref 0.50–1.10)
GFR, Est African American: 63 mL/min
GFR, Est Non African American: 55 mL/min — ABNORMAL LOW
GLUCOSE: 218 mg/dL — AB (ref 70–99)
POTASSIUM: 5.4 meq/L — AB (ref 3.5–5.3)
Sodium: 138 mEq/L (ref 135–145)

## 2014-07-23 NOTE — Addendum Note (Signed)
Addended by: Willow Ora on: 07/23/2014 10:52 AM   Modules accepted: Orders

## 2014-07-23 NOTE — Telephone Encounter (Signed)
Pt's PCP discussed [facility] during [facility] appointment pt/daughter in agreement to referral.

## 2014-07-23 NOTE — Telephone Encounter (Signed)
Called pt - no answer; left message to come in to have repeat labs per Dr Marsa Aris.

## 2014-07-23 NOTE — Patient Outreach (Signed)
[facility]) Care Management  07/23/2014  [name] 1933-04-12 [id number]   Referral from MD via High Risk List, assigned Maury Dus, RN.  [name]. [place], [place] Management [place] Assistant Phone: [phone] Fax: [phone]

## 2014-07-23 NOTE — Telephone Encounter (Signed)
-----   Message from Wilber Oliphant, MD sent at 07/23/2014 12:07 PM EDT ----- Hi glenda,  Please let ms Wohler know to return to clinic for repeat lab work.   Thanks,  Dr q ----- Message -----    From: Lab in [facility] Interface    Sent: 07/21/2014   1:40 AM      To: Wilber Oliphant, MD

## 2014-07-24 NOTE — Telephone Encounter (Signed)
Hi glenda,  Please change this to proper clinic visit with anyone as soon as they can come. They will need bp check and labs done at that time since she cant come back for repeat. Also, can [facility] maybe help with this to help get patient to office if daughter cant bring her? I do not want her to get spread too far out before appt and would prefer this week if possible.   Thanks,  Dr q

## 2014-07-24 NOTE — Telephone Encounter (Signed)
Daughter stated there is no one else who can bring her mother to her appt. She does not get off of work until Saturday (she works straight thru the week; she has to let her boss know ahead of time). Said she is not connected to [facility].  Appt on Monday @ 1:45PM.

## 2014-07-24 NOTE — Telephone Encounter (Signed)
Talked to pt's daughter; said she's at work and will not get off until Friday (works from Mon-Fri at a [employer]); unable to to bring her mother until next week. Lab appt scheduled June 14th @ 2PM.

## 2014-07-25 ENCOUNTER — Telehealth: Payer: Self-pay | Admitting: *Deleted

## 2014-07-25 NOTE — Telephone Encounter (Signed)
SPOKE WITH DAUGHTER, GAVE HER PHONE NUMBER TO [facility] TO RETURN THEIR CALL TO SET UP APPOINTMENT FOR HER MOTHER.

## 2014-07-27 ENCOUNTER — Other Ambulatory Visit: Payer: Self-pay | Admitting: Internal Medicine

## 2014-07-27 NOTE — Telephone Encounter (Signed)
Hi helen,  Is ms Sheahan still taking this and does she still need it? It has not been refilled for quite sometime.   Thanks,  Dr q

## 2014-07-29 NOTE — Telephone Encounter (Signed)
I spoke with Ms. Knupp grand daughter Yvetta Coder (caretaker of Ms. Monterroso along with daughter [name]) to confirm they are able to come for the scheduled appointment tomorrow since they were unable to come to the clinic last week as requested. I tried to speak with Ms. Surber directly however Yvetta Coder was not by her at the time but will be heading back home soon. She reports Ms. Huebsch is doing well but did briefly report of some burning sensation earlier today when she went to the bathroom that then resolved. She said she was no longer in pain and that her blood sugars and bp have not been high to her knowledge but she wasn't sure of the blood pressure number but would check when she got home. Morning blood sugar that she recalls was in the 170s. I reviewed her labs with noted hemolysis and insufficient quantity and let her know to hold her lisinopril in the meantime until they are seen in the office for repeat lab work, bp check, and also likely work up of her dysuria if persistent.  In the meantime, I advised her that if the pain is worsening or persistent to have Ms. Davidoff evaluated in the ED or urgent care today. She voiced understanding.

## 2014-07-30 ENCOUNTER — Ambulatory Visit (INDEPENDENT_AMBULATORY_CARE_PROVIDER_SITE_OTHER): Payer: Medicare Other | Admitting: Pulmonary Disease

## 2014-07-30 ENCOUNTER — Encounter: Payer: Self-pay | Admitting: Pulmonary Disease

## 2014-07-30 VITALS — BP 151/66 | HR 66 | Temp 98.6°F | Ht 64.0 in | Wt 205.5 lb

## 2014-07-30 DIAGNOSIS — K219 Gastro-esophageal reflux disease without esophagitis: Secondary | ICD-10-CM

## 2014-07-30 DIAGNOSIS — I10 Essential (primary) hypertension: Secondary | ICD-10-CM

## 2014-07-30 DIAGNOSIS — M129 Arthropathy, unspecified: Secondary | ICD-10-CM

## 2014-07-30 DIAGNOSIS — E875 Hyperkalemia: Secondary | ICD-10-CM

## 2014-07-30 DIAGNOSIS — Z7982 Long term (current) use of aspirin: Secondary | ICD-10-CM | POA: Diagnosis not present

## 2014-07-30 DIAGNOSIS — M1990 Unspecified osteoarthritis, unspecified site: Secondary | ICD-10-CM

## 2014-07-30 LAB — BASIC METABOLIC PANEL
ANION GAP: 5 (ref 5–15)
BUN: 18 mg/dL (ref 6–20)
CO2: 26 mmol/L (ref 22–32)
Calcium: 8.1 mg/dL — ABNORMAL LOW (ref 8.9–10.3)
Chloride: 107 mmol/L (ref 101–111)
Creatinine, Ser: 1.08 mg/dL — ABNORMAL HIGH (ref 0.44–1.00)
GFR, EST AFRICAN AMERICAN: 55 mL/min — AB (ref >60)
GFR, EST NON AFRICAN AMERICAN: 48 mL/min — AB (ref >60)
GLUCOSE: 322 mg/dL — AB (ref 65–99)
Potassium: 3.9 mmol/L (ref 3.5–5.1)
SODIUM: 138 mmol/L (ref 135–145)

## 2014-07-30 MED ORDER — DICLOFENAC SODIUM 1 % TD GEL: 4.0000 g | Freq: Four times a day (QID) | TRANSDERMAL | Status: DC

## 2014-07-30 MED ORDER — OMEPRAZOLE 20 MG PO CPDR: 20.0000 mg | DELAYED_RELEASE_CAPSULE | Freq: Every day | ORAL | Status: DC

## 2014-07-30 NOTE — Progress Notes (Signed)
Subjective:    Patient ID: [name], female    DOB: 1934-01-04, 81 y.o.   MRN: [id number]  HPI [name] is a 81 year old woman with history of DM, CVA, CHF, HLD, HTN, GERD presenting for follow up.   She was last seen in clinic 07/20/2014. At that time, her BMP had potassium 5.4. She did not take her lisinopril last night.   Her daughter is concerned that her mother still has knee pain despite taking tylenol #3 4 times a day.  Review of Systems Constitutional: no fevers/chills Eyes: no vision changes Ears, nose, mouth, throat, and face: no cough Respiratory: no shortness of breath Cardiovascular: no chest pain Gastrointestinal: no nausea/vomiting, no abdominal pain, no constipation, no diarrhea Genitourinary: no dysuria, no hematuria Integument: no rash Hematologic/lymphatic: no bleeding/bruising, no edema Musculoskeletal: +arthralgias, no myalgias Neurological: no paresthesias, no weakness  Past Medical History  Diagnosis Date  . Diabetes mellitus 2007    HgA1C (02/20/2010) = 9.2, HgA1C (03/20/2009) = 12.1  . CVA (cerebrovascular accident) 0258     right embolic stroke, no residual deficits  . Diverticulitis   . CVA (cerebral infarction) 7-yrs ago  . Arthritis   . Dysphagia   . CHF (congestive heart failure)   . DIABETES MELLITUS, TYPE II 12/04/2005  . HYPERLIPIDEMIA 12/04/2005  . HYPERTENSION 12/04/2005  . GERD 12/04/2005  . ARTHRITIS, KNEE 03/24/2006  . Lung mass 07/08/2010  . Meningioma 07/21/2010  . Hemangioma of liver 12/02/2010  . Depression 12/02/2010  . Adenocarcinoma of lung      Right upper lobe adenocarcinoma. s/p right lower lobectomy 12/15/10  . Brain cancer     7 cm cerebral right atypical meningoma grade II    Current Outpatient Prescriptions on File Prior to Visit  Medication Sig Dispense Refill  . acetaminophen-codeine (TYLENOL #3) 300-30 MG per tablet Take 1 tablet by mouth every 8 (eight) hours as needed for moderate pain. 90 tablet  0  . albuterol (PROVENTIL HFA;VENTOLIN HFA) 108 (90 BASE) MCG/ACT inhaler Inhale 2 puffs into the lungs every 4 (four) hours as needed for wheezing. 3 Inhaler 1  . albuterol (PROVENTIL) (2.5 MG/3ML) 0.083% nebulizer solution USE 1 NEBULE EVERY 6 HOURS AS NEEDED FOR WHEEZING. ICD10: C34.90 (Patient not taking: Reported on 07/20/2014) 75 mL 0  . aspirin EC 81 MG tablet Take 81 mg by mouth daily.    . carvedilol (COREG) 25 MG tablet Take 1 tablet (25 mg total) by mouth 2 (two) times daily with a meal. 180 tablet 4  . cephALEXin (KEFLEX) 500 MG capsule Take 1 capsule (500 mg total) by mouth 2 (two) times daily. (Patient not taking: Reported on 07/20/2014) 20 capsule 0  . FERREX 150 150 MG capsule TAKE 1 CAPSULE BY MOUTH DAILY. 30 capsule 3  . FLUoxetine (PROZAC) 10 MG capsule TAKE 1 CAPSULE (10 MG TOTAL) BY MOUTH AT BEDTIME. 30 capsule 1  . glucose blood (ACCU-CHEK AVIVA) test strip Use to Check Blood Sugars 3 Times Daily.Dx Code: 250.00. Insulin dependent 100 each 12  . guaiFENesin-dextromethorphan (ROBITUSSIN DM) 100-10 MG/5ML syrup Take 5 mLs by mouth every 4 (four) hours as needed for cough. 118 mL 0  . insulin aspart (NOVOLOG FLEXPEN) 100 UNIT/ML FlexPen Novolog Insulin: 91 - 160 5 units 161- 230 7 units 231-300 9 units 301- 370 11 units 371 - 440 13 units 441 - 510 15 units 15 mL 11  . Insulin Glargine (LANTUS SOLOSTAR) 100 UNIT/ML Solostar Pen Inject 17 Units into  the skin at bedtime. 10 mL 2  . levETIRAcetam (KEPPRA) 750 MG tablet Take 1 tablet (750 mg total) by mouth 2 (two) times daily. 60 tablet 11  . lisinopril (PRINIVIL,ZESTRIL) 10 MG tablet Take 2 tablets (20 mg total) by mouth daily. 60 tablet 0  . omeprazole (PRILOSEC) 20 MG capsule Take 1 capsule (20 mg total) by mouth daily. 30 capsule 3  . simvastatin (ZOCOR) 20 MG tablet Take 20 mg by mouth daily.     No current facility-administered medications on file prior to visit.    Today's Vitals   07/30/14 1426  BP: 151/66   Pulse: 66  Temp: 98.6 F (37 C)  TempSrc: Oral  Height: '5\' 4"'$  (1.626 m)  Weight: 205 lb 8 oz (93.214 kg)  SpO2: 100%  PainSc: 0-No pain    Objective:  Physical Exam  Constitutional: She appears well-developed and well-nourished. No distress.  HENT:  Head: Normocephalic.  Eyes: Conjunctivae and EOM are normal.  Cardiovascular: Normal rate and regular rhythm.   Pulmonary/Chest: Effort normal and breath sounds normal.  Abdominal: Soft. Bowel sounds are normal. There is no tenderness.  Neurological: She is alert.  Skin: Skin is warm and dry.  Psychiatric: She has a normal mood and affect.    Assessment & Plan:  Please refer to problem based charting.

## 2014-07-30 NOTE — Patient Instructions (Addendum)
You may use the Voltaren (diclofenac) gel four times a day for knee pain.  Please restart the lisinopril.  General Instructions:   Please bring your medicines with you each time you come to clinic.  Medicines may include prescription medications, over-the-counter medications, herbal remedies, eye drops, vitamins, or other pills.   Progress Toward Treatment Goals:  Treatment Goal 07/30/2014  Hemoglobin A1C unable to assess  Blood pressure improved    Self Care Goals & Plans:  Self Care Goal 07/30/2014  Manage my medications bring my medications to every visit  Monitor my health keep track of my blood glucose; bring my glucose meter and log to each visit; keep track of my blood pressure  Eat healthy foods drink diet soda or water instead of juice or soda; eat more vegetables; eat foods that are low in salt; eat baked foods instead of fried foods; eat fruit for snacks and desserts  Be physically active -    Home Blood Glucose Monitoring 03/23/2014  Check my blood sugar 3 times a day  When to check my blood sugar before meals

## 2014-07-31 ENCOUNTER — Other Ambulatory Visit: Payer: Medicare Other

## 2014-07-31 DIAGNOSIS — E875 Hyperkalemia: Secondary | ICD-10-CM | POA: Insufficient documentation

## 2014-07-31 NOTE — Assessment & Plan Note (Signed)
Likely 2/2 hemolysis of sample. Potassium wnl on recheck today.  BMP Latest Ref Rng 07/30/2014 07/20/2014 07/03/2014  Glucose 65 - 99 mg/dL 322(H) 218(H) 327(H)  BUN 6 - 20 mg/dL 18 CANCELED 21(H)  Creatinine 0.44 - 1.00 mg/dL 1.08(H) 0.99 1.05(H)  Sodium 135 - 145 mmol/L 138 138 137  Potassium 3.5 - 5.1 mmol/L 3.9 5.4(H) 3.4(L)  Chloride 101 - 111 mmol/L 107 103 99(L)  CO2 22 - 32 mmol/L '26 21 27  '$ Calcium 8.9 - 10.3 mg/dL 8.1(L) CANCELED 8.2(L)

## 2014-07-31 NOTE — Assessment & Plan Note (Signed)
Continuing to have pain.  Plan:  -Continue tylenol #3 TID prn -Voltaren gel QID prn

## 2014-07-31 NOTE — Assessment & Plan Note (Signed)
Refilled Rx for omeprazole '20mg'$  daily

## 2014-07-31 NOTE — Assessment & Plan Note (Signed)
BP Readings from Last 3 Encounters:  07/30/14 151/66  07/20/14 203/81  07/03/14 143/56    Lab Results  Component Value Date   NA 138 07/30/2014   K 3.9 07/30/2014   CREATININE 1.08* 07/30/2014    Assessment: Blood pressure control: mildly elevated Progress toward BP goal:  improved  Plan: Medications:  Continue current medications including carvedilol '25mg'$  BID, lisinopril '10mg'$  daily Other plans:  -Follow up in 1 month

## 2014-08-01 ENCOUNTER — Encounter: Payer: Self-pay | Admitting: *Deleted

## 2014-08-01 NOTE — Progress Notes (Signed)
Internal Medicine Clinic Attending  Case discussed with Dr. Krall at the time of the visit.  We reviewed the resident's history and exam and pertinent patient test results.  I agree with the assessment, diagnosis, and plan of care documented in the resident's note.  

## 2014-08-01 NOTE — Telephone Encounter (Signed)
Thank you Samaya.

## 2014-08-02 ENCOUNTER — Other Ambulatory Visit: Payer: Self-pay

## 2014-08-02 NOTE — Patient Outreach (Signed)
[facility]) Care Management  08/02/2014  [name] May 04, 1933 [id number]   RN CM attempted to reach patient to discuss [facility] services.  Patient was unavailable at the time and HIPPA compliant voice mail message left with return call back number.  RN CM will try back at a later date.  Maury Dus, RN, Ishmael Holter, [place] Telephonic Care Coordinator [phone]

## 2014-08-06 ENCOUNTER — Other Ambulatory Visit: Payer: Self-pay

## 2014-08-06 DIAGNOSIS — I10 Essential (primary) hypertension: Secondary | ICD-10-CM

## 2014-08-06 DIAGNOSIS — E1169 Type 2 diabetes mellitus with other specified complication: Secondary | ICD-10-CM

## 2014-08-06 NOTE — Patient Outreach (Signed)
[facility] [facility]) Care Management  08/06/2014  BRITANNI YARDE [birth year] [id number]   Request from Maury Dus, RN to assign Community RN for High Risk List patient, [name] David, RN assigned.  [name]. [place], [place] Management [place] Assistant Phone: [phone] Fax: [phone]

## 2014-08-06 NOTE — Patient Outreach (Signed)
[facility]) Care Management  08/06/2014  LYLEE CORROW 03/02/1933 [id number]   RN CM spoke with patient's daughter [name] about the services of [facility].  [name] stated patient was sleeping and did not get up until after 12 noon.  [name] states she takes care of all of her mother's health care issues.  States she take the patient to all appointments at [facility] Internal Medicine clinic.  States she asked for an evaluation from [facility]  To determine her mother's needs.   RN CM asked about patient's elevated blood pressure.  [name] states she prepares patient's medications, however it is unclear if patient takes her medications.  She states she gives patient her insulins as ordered.  Patient diabetes is not well controlled with A1c 10.3.  Daughter reports patient has fallen at home with no apparent injuries noted.  As patient's caregiver, [name] is agreeable to services and wishes to schedule an appointment.   RN CM will make a referral to community nurse to make a home assessment for case management of patient's chronic conditions.  Maury Dus, RN, Ishmael Holter, [place] Telephonic Care Coordinator [phone]

## 2014-08-14 ENCOUNTER — Other Ambulatory Visit: Payer: Self-pay | Admitting: Internal Medicine

## 2014-08-17 ENCOUNTER — Other Ambulatory Visit: Payer: Self-pay | Admitting: *Deleted

## 2014-08-17 NOTE — Patient Outreach (Signed)
Referral received from telephonic care manager, C. Poteat, regarding need for further assessment for care management and community needs.  Member has diagnosis of diabetes with an A1C of 10.3.    Call placed to member to perform telephone assessment and to schedule an initial home visit.  No answer, member's mailbox is full.  Will make second attempt to contact member next week.  Valente David, BSN, [place] Management  [facility] Care Manager [phone]

## 2014-08-22 ENCOUNTER — Other Ambulatory Visit: Payer: Self-pay | Admitting: *Deleted

## 2014-08-22 NOTE — Patient Outreach (Signed)
Second call placed to member to initiate involvement with [facility].  Member's daughter answers phone, member's identity verified.  Daughter verifies that she and the member are interested in community care management involvement.  Daughter states that the member has appointments with neurology and for the eye doctor next week and the week after, and that she also has an appointment with the urologist.  Initial home visit scheduled for 7/18.  Contact information for this care manager provided, encouraged to call with any concerns or questions prior to scheduled visit.  Valente David, BSN, [place] Management  [facility] Care Manager [phone]

## 2014-08-25 ENCOUNTER — Other Ambulatory Visit: Payer: Self-pay | Admitting: Internal Medicine

## 2014-08-30 ENCOUNTER — Ambulatory Visit: Payer: Medicare Other | Admitting: Neurology

## 2014-09-03 ENCOUNTER — Encounter: Payer: Self-pay | Admitting: *Deleted

## 2014-09-03 ENCOUNTER — Other Ambulatory Visit: Payer: Self-pay | Admitting: *Deleted

## 2014-09-03 VITALS — BP 140/78 | HR 73 | Resp 20 | Ht 64.0 in | Wt 200.0 lb

## 2014-09-03 DIAGNOSIS — R569 Unspecified convulsions: Secondary | ICD-10-CM

## 2014-09-03 DIAGNOSIS — E118 Type 2 diabetes mellitus with unspecified complications: Secondary | ICD-10-CM

## 2014-09-03 NOTE — Patient Outreach (Signed)
[facility]) Care Management   09/03/2014  [name] 09/19/1933 [id number]  JAMELLA GRAYER is an 81 y.o. female  Subjective:   "I'm ok."  Member denies any pain or discomfort.  Objective:   Review of Systems  Constitutional: Negative.   HENT: Negative.   Eyes: Negative.   Respiratory: Negative.   Cardiovascular: Negative.   Gastrointestinal: Negative.   Genitourinary: Negative.   Musculoskeletal: Negative.   Skin: Negative.   Neurological: Negative.   Psychiatric/Behavioral: Negative.     Physical Exam  Constitutional: She is oriented to person, place, and time. She appears well-developed and well-nourished.  Neck: Normal range of motion.  Cardiovascular: Normal rate, regular rhythm and normal heart sounds.   Respiratory: Effort normal and breath sounds normal.  GI: Soft. Bowel sounds are normal.  Musculoskeletal: Normal range of motion.  Neurological: She is alert and oriented to person, place, and time.  Skin: Skin is warm and dry.   BP 140/78 mmHg  Pulse 73  Resp 20  Ht 1.626 m ('5\' 4"'$ )  Wt 200 lb (90.719 kg)  BMI 34.31 kg/m2  SpO2 98%   Current Medications:   Current Outpatient Prescriptions  Medication Sig Dispense Refill  . acetaminophen-codeine (TYLENOL #3) 300-30 MG per tablet Take 1 tablet by mouth every 8 (eight) hours as needed for moderate pain. 90 tablet 0  . aspirin EC 81 MG tablet Take 81 mg by mouth daily.    . carvedilol (COREG) 25 MG tablet Take 1 tablet (25 mg total) by mouth 2 (two) times daily with a meal. 180 tablet 4  . diclofenac sodium (VOLTAREN) 1 % GEL Apply 4 g topically 4 (four) times daily. 100 g 1  . FERREX 150 150 MG capsule TAKE 1 CAPSULE BY MOUTH DAILY. 30 capsule 3  . FLUoxetine (PROZAC) 10 MG capsule TAKE 1 CAPSULE (10 MG TOTAL) BY MOUTH AT BEDTIME. 30 capsule 1  . glucose blood (ACCU-CHEK AVIVA) test strip Use to Check Blood Sugars 3 Times Daily.Dx Code: 250.00. Insulin dependent 100 each 12  .  insulin aspart (NOVOLOG FLEXPEN) 100 UNIT/ML FlexPen Novolog Insulin: 91 - 160 5 units 161- 230 7 units 231-300 9 units 301- 370 11 units 371 - 440 13 units 441 - 510 15 units 15 mL 11  . Insulin Glargine (LANTUS SOLOSTAR) 100 UNIT/ML Solostar Pen Inject 17 Units into the skin at bedtime. 10 mL 2  . levETIRAcetam (KEPPRA) 750 MG tablet Take 1 tablet (750 mg total) by mouth 2 (two) times daily. 60 tablet 11  . lisinopril (PRINIVIL,ZESTRIL) 10 MG tablet TAKE 2 TABLETS BY MOUTH DAILY 60 tablet 0  . omeprazole (PRILOSEC) 20 MG capsule Take 1 capsule (20 mg total) by mouth daily. 30 capsule 3  . simvastatin (ZOCOR) 20 MG tablet Take 20 mg by mouth daily.    Marland Kitchen albuterol (PROVENTIL HFA;VENTOLIN HFA) 108 (90 BASE) MCG/ACT inhaler Inhale 2 puffs into the lungs every 4 (four) hours as needed for wheezing. (Patient not taking: Reported on 09/03/2014) 3 Inhaler 1  . albuterol (PROVENTIL) (2.5 MG/3ML) 0.083% nebulizer solution USE 1 NEBULE EVERY 6 HOURS AS NEEDED FOR WHEEZING. ICD10: C34.90 (Patient not taking: Reported on 07/20/2014) 75 mL 0  . guaiFENesin-dextromethorphan (ROBITUSSIN DM) 100-10 MG/5ML syrup Take 5 mLs by mouth every 4 (four) hours as needed for cough. (Patient not taking: Reported on 09/03/2014) 118 mL 0   No current facility-administered medications for this visit.    Functional Status:   In your present  state of health, do you have any difficulty performing the following activities: 09/03/2014 07/20/2014  Hearing? [name]  Vision? Y Y  Difficulty concentrating or making decisions? Y N  Walking or climbing stairs? Y Y  Dressing or bathing? N Y  Doing errands, shopping? [name]  Preparing Food and eating ? Y -  Using the Toilet? N -  In the past six months, have you accidently leaked urine? Y -  Do you have problems with loss of bowel control? N -  Managing your Medications? Y -  Managing your Finances? Y -  Housekeeping or managing your Housekeeping? Y -    Fall/Depression  Screening:    PHQ 2/9 Scores 09/03/2014 07/20/2014 07/03/2014 06/07/2014 03/23/2014 03/06/2014 01/30/2014  PHQ - 2 Score 0 0 0 0 0 0 0    Assessment:    Call placed to member's daughter, [name], prior to arrival at [facility] home to confirm appointment time was still feasible.  [name], who is the [facility] caregiver, states that the appointment time was good.  This care manager arrived at [facility] home at scheduled time.  Member was at the home with her granddaughters, however, DorisMaisie Burns was not present at the start of the visit.  Member attempted to provide details of health history and status, but was unable to provide complete information.  One granddaughter states that [name] went out to pick up another one of the member's granddaughters and would be arriving shortly.  Attempted to explain [facility] services to member, but member did not fully understand.  Once [name] arrived, [facility] services explained again and consent obtained.  Member states that she feels her biggest health concern is her diabetes.  [name] notified of the availability of EMMI education for diabetes management.  Daughter states that the member was not placed on a special diet, but was told to limit her carbohydrates and sugars.  This care manager also discussed limiting her sodium intake due to history of congestive heart failure.  Daughter states that they don't cook with salt, however member is currently eating sausage and bacon.  Discussed proper low sodium and low carbohydrate foods.  Daughter does not express any concern regarding transportation, financial status, or affordability of medications.  She does express concern about member getting weak in the shower and requests a shower chair.  She also reports that member has been medication to control leaking of urine, but that she still wears depends.  She is requesting assistance paying for these, stating that they can be a problem to purchase at times.  She  states that she arranges all of the member's medications for her and that she takes them as prescribed.  [name] states that the member has had frequent urinary tract infections in the past and was referred to a urologist.  She states that she received a letter from the office stating that the member had missed an appointment.  [name] states that she was never notified of an appointment and will call to discuss and reschedule.  Member has an appointment with Dr. Leonie Man on 8/2 for her history of seizures.  [name] states that she has not witnessed any seizures in the year.  [name] states that she is the primary caregiver for the member, with her daughters stepping in to assist when she is not available.  Discussed daughter about monitoring member's weight, blood pressure, and blood sugar on a daily basis and recording it.  [facility] calendar provided, daughter  instructed on how to complete monitoring sheets.  Daughter also provided with information on Congestive heart failure as they state they were never educated.  Heart failure zones discussed, notified when to call physician with weight gain and other symptoms.  This care manager provided contact information and instructed to contact with any concerns.       Plan:   Will consult social worker to address concerns of medical supplies and equipment. Routine home visit scheduled for next month. Will provide EMMI education on diabetes management.  [facility] CM Care Plan Problem One        Patient Outreach from 09/03/2014 in [place] Problem One  Elevated A1C (10.3)   Care Plan for Problem One  Active   [facility] Long Term Goal (31-90 days)  Member's A1C will decrease below desired range within the next 90 days   [facility] Long Term Goal Start Date  09/03/14   [facility] CM Short Term Goal #1 (0-30 days)  Member's daughter will monitor blood sugars on a daily basis and record readings within the next 4 weeks   [facility] CM Short Term Goal  #1 Start Date  09/03/14   Interventions for Short Term Goal #1  Discusses the importance of monitoring daily blood sugars and recording to visualize a trend, and report any abnormal readings to physician   [facility] CM Short Term Goal #2 (0-30 days)  Member will take medications as prescribed within the next 4 weeks   [facility] CM Short Term Goal #2 Start Date  09/03/14   Interventions for Short Term Goal #2  Discussed the importance of taking medications as prescribed   [facility] CM Short Term Goal #3 (0-30 days)  Member and family will monitor diet and be sure to follow diabetic and heart healthy diet over he next 4 weeks   [facility] CM Short Term Goal #3 Start Date  09/03/14   Interventions for Short Tern Goal #3  Discussed the importance of following proper diet (low carbs, low sodium), EMMI assigned.    [place] Problem Two        Patient Outreach from 09/03/2014 in [place] Problem Two  Hypertension   Care Plan for Problem Two  Active   [facility] CM Short Term Goal #1 (0-30 days)  Member's daughter will moitor member's blood pressure daily and record readings within the next 4 weeks   [facility] CM Short Term Goal #1 Start Date  09/03/14   Interventions for Short Term Goal #2   Discussed the importance of monitoring blood pressure to provide accurate trends to physician for medication management     Valente David, BSN, [place] Manager [phone]

## 2014-09-05 NOTE — Patient Outreach (Signed)
[facility]) Care Management  09/05/2014  NEVIAH BRAUD 1933/02/20 [id number]   Request from Valente David, RN to assign SW, [name], RN assigned to outreach.  [name]. [place], [place] Management [place] Assistant Phone: [phone] Fax: [phone]

## 2014-09-05 NOTE — Addendum Note (Signed)
Addended by: Hulan Fray on: 09/05/2014 06:58 PM   Modules accepted: Orders

## 2014-09-07 ENCOUNTER — Other Ambulatory Visit: Payer: Self-pay | Admitting: Licensed Clinical Social Worker

## 2014-09-07 NOTE — Patient Outreach (Signed)
[facility]) Care Management  09/07/2014  [name] 10/12/1933 [id number]   Assessment-  CSW received referral for patient and is requesting financial assistance with gaining pull up depends, an elevated toilet seat and shower chair. CSW made initial outreach to patient via phone and received HIPPA verifications. CSW completed FAF with patient over the phone. Patient reports that her daughter quit her job a few years ago to take care of her throughout the day. Patient was educated on [employer] and stated that she would consider it. Patient reports that she falls 3 to 4x per year but always informs her doctor once a fall occurs. Patient shares that she had several falls in the bathroom last year but this year had a fall while getting in the bed. Patient states that she does not wish to have a wheel chair because she does not "wish to be dependent on it." Patient reports that her "knees give out quickly." Patient has not been hospitalized this past year. Patient has CHF, Diabetes, Seizures and HTN. Patient had lung surgery on 11/14 and brain surgery on 1/15.   Plan-CSW will turn in patient's FAF to see if she is eligible for durable medical equipment funding within [facility].          CSW will contact patient next week.          CSW will work on goal for patient to gain needed medical supplies.

## 2014-09-11 ENCOUNTER — Encounter: Payer: Self-pay | Admitting: *Deleted

## 2014-09-13 ENCOUNTER — Other Ambulatory Visit: Payer: Self-pay | Admitting: Internal Medicine

## 2014-09-18 ENCOUNTER — Encounter: Payer: Self-pay | Admitting: Neurology

## 2014-09-18 ENCOUNTER — Other Ambulatory Visit: Payer: Self-pay | Admitting: *Deleted

## 2014-09-18 ENCOUNTER — Ambulatory Visit (INDEPENDENT_AMBULATORY_CARE_PROVIDER_SITE_OTHER): Payer: Medicare Other | Admitting: Neurology

## 2014-09-18 VITALS — BP 162/75 | HR 64 | Temp 98.7°F | Ht 64.0 in | Wt 202.2 lb

## 2014-09-18 DIAGNOSIS — G40909 Epilepsy, unspecified, not intractable, without status epilepticus: Secondary | ICD-10-CM

## 2014-09-18 DIAGNOSIS — N3946 Mixed incontinence: Secondary | ICD-10-CM

## 2014-09-18 DIAGNOSIS — Z8669 Personal history of other diseases of the nervous system and sense organs: Secondary | ICD-10-CM | POA: Diagnosis not present

## 2014-09-18 DIAGNOSIS — C3490 Malignant neoplasm of unspecified part of unspecified bronchus or lung: Secondary | ICD-10-CM | POA: Diagnosis not present

## 2014-09-18 DIAGNOSIS — I10 Essential (primary) hypertension: Secondary | ICD-10-CM

## 2014-09-18 DIAGNOSIS — Z86011 Personal history of benign neoplasm of the brain: Secondary | ICD-10-CM

## 2014-09-18 MED ORDER — LAMOTRIGINE 100 MG PO TABS: 100.0000 mg | ORAL_TABLET | Freq: Two times a day (BID) | ORAL | Status: DC

## 2014-09-18 MED ORDER — LAMOTRIGINE 25 MG PO TABS: ORAL_TABLET | ORAL | Status: DC

## 2014-09-18 NOTE — Progress Notes (Signed)
[facility]  PATIENT: [name] DOB: Mar 22, 1933  REFERRING DOCTOR OR PCP:  [facility]) SOURCE: patient, records in EMR, MRI images on PACS  _________________________________   HISTORICAL  CHIEF COMPLAINT:  Chief Complaint  Patient presents with  . New Evaluation    [name], daughter with pt. Room 14. Seizures, internal referral. Brain surgery last year, January and suspected she may still be having seizures.     HISTORY OF PRESENT ILLNESS:  I had the pleasure seeing you patient, [name], at [facility] for neurologic consultation regarding her history of meningioma with craniotomy and probability of seizures. She had a known small right frontal meningioma present on MRI in 2012. In January 2015, she presented with confusion and had an MRI of the brain showing that the meningioma had greatly enlarged in size. She was admitted due to mass effect and shift and had craniotomy with removal of the bulk of the tumor.  Associated with the confusion, and prior to the surgery, she was noted to have some spells where she made some guttural sounds. She would also look to the left and then start beating her leg with the left arm.   The daughter felt she was more apathetic after the spells.  She has not had any more of these larger spells since the surgery. However, she is having staring spells at times. These might occur for about 5 minutes and occur a couple times a month. If the daughter cries out the patient's name she will respond at that time but quickly responds other times.   The spells are not associated with incontinence or tongue biting. Her daughter has not observed any generalized tonic-clonic activity. She has been on Keppra 750 mg twice a day since the time of the surgery. Dose has not been adjusted any over the last year.  I personally reviewed the imaging studies.   MRI of the brain 02/25/2013 shows a very large right frontal  meningioma with 12 mm of right-to-left shift due to significant mass effect. There is a much smaller 6 mm meningioma in the right tentorial region    MRI of the brain for 06/09/2013 shows craniotomy with removal of most of the meningioma. There is still dural enhancement at that region.      Also of note, October 2014, a few months before her neurosurgery, she had thoracic surgery to remove part of one of her lungs due to cancer. The daughter has been told that they likely removed all of the cancer but that it can still come back in the future.     She has some urinary frequency and some incontinence. This is stable and unrelated to the staring spells.  REVIEW OF SYSTEMS: Constitutional: No fevers, chills, sweats, or change in appetite Eyes: No visual changes, double vision, eye pain Ear, nose and throat: No hearing loss, ear pain, nasal congestion, sore throat Cardiovascular: No chest pain, palpitations Respiratory: No shortness of breath at rest or with exertion.   No wheezes GastrointestinaI: No nausea, vomiting, diarrhea, abdominal pain, fecal incontinence Genitourinary: No dysuria, urinary retention or frequency.  No nocturia. Musculoskeletal: No neck pain, back pain Integumentary: No rash, pruritus, skin lesions Neurological: as above Psychiatric: No depression at this time.  No anxiety Endocrine: No palpitations, diaphoresis, change in appetite, change in weigh or increased thirst Hematologic/Lymphatic: No anemia, purpura, petechiae. Allergic/Immunologic: No itchy/runny eyes, nasal congestion, recent allergic reactions, rashes  ALLERGIES: No Known Allergies  HOME MEDICATIONS:  Current  outpatient prescriptions:  .  acetaminophen-codeine (TYLENOL #3) 300-30 MG per tablet, Take 1 tablet by mouth every 8 (eight) hours as needed for moderate pain., Disp: 90 tablet, Rfl: 0 .  aspirin EC 81 MG tablet, Take 81 mg by mouth daily., Disp: , Rfl:  .  carvedilol (COREG) 25 MG tablet, Take 1  tablet (25 mg total) by mouth 2 (two) times daily with a meal., Disp: 180 tablet, Rfl: 4 .  diclofenac sodium (VOLTAREN) 1 % GEL, Apply 4 g topically 4 (four) times daily., Disp: 100 g, Rfl: 1 .  FERREX 150 150 MG capsule, TAKE 1 CAPSULE BY MOUTH DAILY., Disp: 30 capsule, Rfl: 3 .  FLUoxetine (PROZAC) 10 MG capsule, TAKE 1 CAPSULE (10 MG TOTAL) BY MOUTH AT BEDTIME., Disp: 30 capsule, Rfl: 1 .  glucose blood (ACCU-CHEK AVIVA) test strip, Use to Check Blood Sugars 3 Times Daily.Dx Code: 250.00. Insulin dependent, Disp: 100 each, Rfl: 12 .  insulin aspart (NOVOLOG FLEXPEN) 100 UNIT/ML FlexPen, Novolog Insulin: 91 - 160 5 units 161- 230 7 units 231-300 9 units 301- 370 11 units 371 - 440 13 units 441 - 510 15 units, Disp: 15 mL, Rfl: 11 .  Insulin Glargine (LANTUS SOLOSTAR) 100 UNIT/ML Solostar Pen, Inject 17 Units into the skin at bedtime., Disp: 10 mL, Rfl: 2 .  levETIRAcetam (KEPPRA) 750 MG tablet, Take 1 tablet (750 mg total) by mouth 2 (two) times daily., Disp: 60 tablet, Rfl: 11 .  lisinopril (PRINIVIL,ZESTRIL) 10 MG tablet, TAKE 2 TABLETS BY MOUTH DAILY, Disp: 60 tablet, Rfl: 0 .  omeprazole (PRILOSEC) 20 MG capsule, Take 1 capsule (20 mg total) by mouth daily., Disp: 30 capsule, Rfl: 3 .  simvastatin (ZOCOR) 20 MG tablet, Take 20 mg by mouth daily., Disp: , Rfl:  .  albuterol (PROVENTIL HFA;VENTOLIN HFA) 108 (90 BASE) MCG/ACT inhaler, Inhale 2 puffs into the lungs every 4 (four) hours as needed for wheezing. (Patient not taking: Reported on 09/03/2014), Disp: 3 Inhaler, Rfl: 1 .  albuterol (PROVENTIL) (2.5 MG/3ML) 0.083% nebulizer solution, USE 1 NEBULE EVERY 6 HOURS AS NEEDED FOR WHEEZING. ICD10: C34.90 (Patient not taking: Reported on 09/18/2014), Disp: 75 mL, Rfl: 0  PAST MEDICAL HISTORY: Past Medical History  Diagnosis Date  . Diabetes mellitus 2007    HgA1C (02/20/2010) = 9.2, HgA1C (03/20/2009) = 12.1  . CVA (cerebrovascular accident) 4580     right embolic stroke, no residual  deficits  . Diverticulitis   . CVA (cerebral infarction) 7-yrs ago  . Arthritis   . Dysphagia   . CHF (congestive heart failure)   . DIABETES MELLITUS, TYPE II 12/04/2005  . HYPERLIPIDEMIA 12/04/2005  . HYPERTENSION 12/04/2005  . GERD 12/04/2005  . ARTHRITIS, KNEE 03/24/2006  . Lung mass 07/08/2010  . Meningioma 07/21/2010  . Hemangioma of liver 12/02/2010  . Depression 12/02/2010  . Adenocarcinoma of lung      Right upper lobe adenocarcinoma. s/p right lower lobectomy 12/15/10  . Brain cancer     7 cm cerebral right atypical meningoma grade II    PAST SURGICAL HISTORY: Past Surgical History  Procedure Laterality Date  . Abdominal hysterectomy    . Video bronchoscope.  12/29/2007    [place]  . Wide excision of left upper back mass.    . Extracapsular cataract extraction with intraocular      lens implantation.  . Right vats,right thoracotomy,right lower lobectomy with node dissection    . Incision and drainage perirectal abscess N/A 01/19/2013  Procedure: IRRIGATION AND DEBRIDEMENT PERIRECTAL ABSCESS;  Surgeon: Zenovia Jarred, MD;  Location: [facility];  Service: General;  Laterality: N/A;  . Craniotomy N/A 03/02/2013    Procedure: CRANIOTOMY TUMOR EXCISION;  Surgeon: Winfield Cunas, MD;  Location: [facility];  Service: Neurosurgery;  Laterality: N/A;  Bifrontal Craniotomy for tumor    FAMILY HISTORY: Family History  Problem Relation Age of Onset  . Hyperlipidemia Brother   . Hypertension Brother   . Diabetes Brother     SOCIAL HISTORY:  History   Social History  . Marital Status: Widowed    Spouse Name: N/A  . Number of Children: 5  . Years of Education: 10   Occupational History  .  Unemployed   Social History Main Topics  . Smoking status: Former Smoker    Types: Cigarettes    Quit date: 02/17/2000  . Smokeless tobacco: Never Used  . Alcohol Use: No  . Drug Use: No  . Sexual Activity: Not Currently   Other Topics Concern  . Not on file   Social  History Narrative   Patient requests to use [employer] [facility] for her diabetes testing supplies as of 09/05/2009.   Lives at home with daughter, [name].   Caffeine use: 1 cup coffee/day        PHYSICAL EXAM  Filed Vitals:   09/18/14 1316  BP: 162/75  Pulse: 64  Temp: 98.7 F (37.1 C)  TempSrc: Oral  Height: '5\' 4"'$  (1.626 m)  Weight: 202 lb 3.2 oz (91.717 kg)    Body mass index is 34.69 kg/(m^2).   General: The patient is well-developed and well-nourished and in no acute distress  Cardiovascular: The heart has a regular rate and rhythm with a normal S1 and S2. There were no murmurs, gallops or rubs.    Skin: Extremities are without significant edema.  Musculoskeletal:  Back is nontender  Neurologic Exam  Mental status: The patient is alert and oriented x 3 at the time of the examination. The patient has reduced STM and reduced attention span and concentration ability.   Speech is normal.  Cranial nerves: Extraocular movements are full.  Ptosis on left.   No gaze preference but looks to right better than left  Facial symmetry is present. There is good facial sensation to soft touch bilaterally.Facial strength is normal.  Trapezius and sternocleidomastoid strength is normal. No dysarthria is noted.  The tongue is midline, and the patient has symmetric elevation of the soft palate. No obvious hearing deficits are noted.  Motor:  Muscle bulk is normal.   Tone is normal. Strength is  5 / 5 in all 4 extremities.   Sensory: Sensory testing is intact to pinprick, soft touch and vibration sensation in arms but reduced vibration in feet  Coordination: Cerebellar testing reveals symmetric finger-nose-finger and heel-to-shin bilaterally.  Gait and station: She needs some help to get out of the chair. Station is normal.   Gait is wide-based with a short stride. She cannot tandem walk..  Romberg is negative.   Reflexes: Deep tendon reflexes are symmetric and normal  bilaterally in arms, reduced in legs    DIAGNOSTIC DATA (LABS, IMAGING, TESTING) - I reviewed patient records, labs, notes, testing and imaging myself where available.  Lab Results  Component Value Date   WBC 9.6 07/01/2014   HGB 11.5* 07/01/2014   HCT 37.7 07/01/2014   MCV 77.9* 07/01/2014   PLT 264 07/01/2014      Component Value Date/Time  NA 138 07/30/2014 1424   NA 146* 07/20/2011 1527   K 3.9 07/30/2014 1424   K 4.2 07/20/2011 1527   CL 107 07/30/2014 1424   CL 105 07/20/2011 1527   CO2 26 07/30/2014 1424   CO2 30 07/20/2011 1527   GLUCOSE 322* 07/30/2014 1424   GLUCOSE 129* 07/20/2011 1527   BUN 18 07/30/2014 1424   BUN 17.3 09/08/2013 1350   BUN 14 07/20/2011 1527   CREATININE 1.08* 07/30/2014 1424   CREATININE 0.99 07/20/2014 1714   CREATININE 1.0 09/08/2013 1350   CALCIUM 8.1* 07/30/2014 1424   CALCIUM 8.6 07/20/2011 1527   PROT 7.4 02/16/2014 2309   PROT 7.5 07/20/2011 1527   ALBUMIN 3.6 02/16/2014 2309   AST 16 02/16/2014 2309   AST 20 07/20/2011 1527   ALT 12 02/16/2014 2309   ALT 24 07/20/2011 1527   ALKPHOS 67 02/16/2014 2309   ALKPHOS 63 07/20/2011 1527   BILITOT 0.4 02/16/2014 2309   BILITOT 0.50 07/20/2011 1527   GFRNONAA 48* 07/30/2014 1424   GFRNONAA 55* 07/20/2014 1714   GFRAA 55* 07/30/2014 1424   GFRAA 63 07/20/2014 1714   Lab Results  Component Value Date   CHOL 165 10/13/2013   HDL 41 10/13/2013   LDLCALC 101* 10/13/2013   TRIG 115 10/13/2013   CHOLHDL 4.0 10/13/2013   Lab Results  Component Value Date   HGBA1C 10.3 07/03/2014   Lab Results  Component Value Date   VITAMINB12 469 10/06/2013   Lab Results  Component Value Date   TSH 1.250 10/06/2013       ASSESSMENT AND PLAN  Seizure disorder - Plan: EEG adult  History of meningioma of the brain - Plan: EEG adult  Adenocarcinoma of lung, unspecified laterality  Mixed incontinence   [name] is a 81 year old woman with a history of meningioma requiring  craniotomy in January 2015. Prior to her surgery she had spells that were more classic for partial onset seizures starting in the right frontal lobe.   I am uncertain if the staring spells represent seizures or just episodes of decreased attention. Large meningiomas such as she had with mass effect could lead to apathy that might increase the likelihood of the spells being due to decreased attention. I will start her on lamotrigine and titrated up to 100 mg by mouth twice a day see if the spells improved. Additionally we will check an EEG to see if there is any actual seizure activity.  She will return to see me in about 3 or 4 months or sooner if she has new or worsening neurologic symptoms.  Thank you for asking me to see Mrs. [name] for a neurologic consultation. Please let me know if I can be of further assistance with her or other patients in the future.   Richard A. Felecia Shelling, MD, PhD 0/04/5463, 6:81 PM Certified in Neurology, Clinical Neurophysiology, Sleep Medicine, Pain Medicine and Neuroimaging  [facility] [address] [place], [place] [address] [phone]

## 2014-09-18 NOTE — Patient Instructions (Signed)
I will call in a prescription to your pharmacy for the 25 mg pill   From 09/19/2014 to 09/25/2014 take 1 pill a day  From 09/26/2014 to 10/02/2014 take 1 pill twice a day  From 10/03/2014 to 10/09/2014 take 3 pills a day  From 10/10/2014 2 10/16/2014 take 2 pills twice a day  I also have given you a paper prescription for a larger strength. Starting 10/17/2014 she should take the 100 mg pill twice a day    We will also set up an EEG to check the brain waves to make sure that she is not having a lot of seizure activity in the brain.

## 2014-09-20 MED ORDER — LISINOPRIL 10 MG PO TABS: 20.0000 mg | ORAL_TABLET | Freq: Every day | ORAL | Status: DC

## 2014-09-21 ENCOUNTER — Other Ambulatory Visit: Payer: Self-pay | Admitting: Licensed Clinical Social Worker

## 2014-09-21 NOTE — Patient Outreach (Signed)
[facility]) Care Management  [facility] Social Work  09/21/2014  [name] 02-27-1933 [id number]   Current Medications:  Current Outpatient Prescriptions  Medication Sig Dispense Refill  . acetaminophen-codeine (TYLENOL #3) 300-30 MG per tablet Take 1 tablet by mouth every 8 (eight) hours as needed for moderate pain. 90 tablet 0  . albuterol (PROVENTIL HFA;VENTOLIN HFA) 108 (90 BASE) MCG/ACT inhaler Inhale 2 puffs into the lungs every 4 (four) hours as needed for wheezing. (Patient not taking: Reported on 09/03/2014) 3 Inhaler 1  . albuterol (PROVENTIL) (2.5 MG/3ML) 0.083% nebulizer solution USE 1 NEBULE EVERY 6 HOURS AS NEEDED FOR WHEEZING. ICD10: C34.90 (Patient not taking: Reported on 09/18/2014) 75 mL 0  . aspirin EC 81 MG tablet Take 81 mg by mouth daily.    . carvedilol (COREG) 25 MG tablet Take 1 tablet (25 mg total) by mouth 2 (two) times daily with a meal. 180 tablet 4  . diclofenac sodium (VOLTAREN) 1 % GEL Apply 4 g topically 4 (four) times daily. 100 g 1  . FERREX 150 150 MG capsule TAKE 1 CAPSULE BY MOUTH DAILY. 30 capsule 3  . FLUoxetine (PROZAC) 10 MG capsule TAKE 1 CAPSULE (10 MG TOTAL) BY MOUTH AT BEDTIME. 30 capsule 1  . glucose blood (ACCU-CHEK AVIVA) test strip Use to Check Blood Sugars 3 Times Daily.Dx Code: 250.00. Insulin dependent 100 each 12  . insulin aspart (NOVOLOG FLEXPEN) 100 UNIT/ML FlexPen Novolog Insulin: 91 - 160 5 units 161- 230 7 units 231-300 9 units 301- 370 11 units 371 - 440 13 units 441 - 510 15 units 15 mL 11  . Insulin Glargine (LANTUS SOLOSTAR) 100 UNIT/ML Solostar Pen Inject 17 Units into the skin at bedtime. 10 mL 2  . lamoTRIgine (LAMICTAL) 100 MG tablet Take 1 tablet (100 mg total) by mouth 2 (two) times daily. 60 tablet 11  . lamoTRIgine (LAMICTAL) 25 MG tablet Take as directed 70 tablet 0  . levETIRAcetam (KEPPRA) 750 MG tablet Take 1 tablet (750 mg total) by mouth 2 (two) times daily. 60 tablet 11  .  lisinopril (PRINIVIL,ZESTRIL) 10 MG tablet Take 2 tablets (20 mg total) by mouth daily. 180 tablet 3  . omeprazole (PRILOSEC) 20 MG capsule Take 1 capsule (20 mg total) by mouth daily. 30 capsule 3  . simvastatin (ZOCOR) 20 MG tablet Take 20 mg by mouth daily.     No current facility-administered medications for this visit.    Functional Status:  In your present state of health, do you have any difficulty performing the following activities: 09/03/2014 07/20/2014  Hearing? [name]  Vision? Y Y  Difficulty concentrating or making decisions? Y N  Walking or climbing stairs? Y Y  Dressing or bathing? N Y  Doing errands, shopping? [name]  Preparing Food and eating ? Y -  Using the Toilet? N -  In the past six months, have you accidently leaked urine? Y -  Do you have problems with loss of bowel control? N -  Managing your Medications? Y -  Managing your Finances? Y -  Housekeeping or managing your Housekeeping? Y -    Fall/Depression Screening:  PHQ 2/9 Scores 09/03/2014 07/20/2014 07/03/2014 06/07/2014 03/23/2014 03/06/2014 01/30/2014  PHQ - 2 Score 0 0 0 0 0 0 0    Assessment: CSW completed initial home visit with patient on 09/21/14. Patient's aid, daughter and grandchildren were at the residence. Patient was approved for durable medical equipment fund and patient was provided with  a $100 gift card through [facility] giving committee in order to get a shower chair and elevated toilet seat. Patient and patient's family were very appreciative for this. Patient is at high risk of falling but reports no recent falls. Patient reports to continue having weakness in knees and legs while in the shower. CSW informed her that shower chair should alleviate this. CSW provided two local discount medical supply stores and their contact information and addresses to daughter. CSW also provided patient with box of incontinence supplies as patient currently had wrong size. Patient's daughter stated that patient has been doing well  lately and has no problems receiving medications at this time. Patient's aid reported that she comes to the house daily which alleviates burn out from daughter who also is reported to be patient's caregiver. Patient's aid stated that patient uses walker, cane and wheelchair but that patient does no go long distances. Patient's daughter Ms. [name] reports that there are no issues with transportation or financial status. CSW completes psychosocial assessment with patient. Patient reports that her prozac medication continues to be effective for her and she has had no recent symptoms of depression. CSW reminded patient and family of upcoming medical appointments. CSW provided family with [facility] guide to use if ever needed. Patient and patient's daughter deny any other SW needs at this time and agree to case closure as gaining essential medical supplies goal has been met. CSW informed them that CSW can re-enter case if ever needed.   Plan: CSW will inform RNCM [facility] and CMA Nena Polio of case closure due to goal met.   Dickie La, BSW, MSW, LCSW [email or web address] Phone: [phone] Fax: [phone]

## 2014-09-24 NOTE — Addendum Note (Signed)
Addended by: Orson Gear on: 09/24/2014 03:55 PM   Modules accepted: Orders

## 2014-09-25 ENCOUNTER — Other Ambulatory Visit: Payer: Medicare Other

## 2014-10-02 ENCOUNTER — Other Ambulatory Visit: Payer: Medicare Other

## 2014-10-03 ENCOUNTER — Encounter: Payer: Self-pay | Admitting: Neurology

## 2014-10-04 ENCOUNTER — Telehealth: Payer: Self-pay | Admitting: Internal Medicine

## 2014-10-04 DIAGNOSIS — M1990 Unspecified osteoarthritis, unspecified site: Secondary | ICD-10-CM

## 2014-10-04 NOTE — Telephone Encounter (Signed)
Return call.  No answer.

## 2014-10-04 NOTE — Telephone Encounter (Signed)
Pt daughter called states mother is having pain in both leg, did take the tylenol # 3 but not working. Want to know if the doctor can give the mother stronger med. Please call pt daughter back.

## 2014-10-05 ENCOUNTER — Inpatient Hospital Stay (HOSPITAL_COMMUNITY)
Admission: EM | Admit: 2014-10-05 | Discharge: 2014-10-12 | DRG: 071 | Disposition: A | Discharge status: Home Health | Payer: Medicare Other | Attending: Internal Medicine | Admitting: Internal Medicine

## 2014-10-05 DIAGNOSIS — E785 Hyperlipidemia, unspecified: Secondary | ICD-10-CM | POA: Diagnosis present

## 2014-10-05 DIAGNOSIS — R509 Fever, unspecified: Secondary | ICD-10-CM

## 2014-10-05 DIAGNOSIS — Z9181 History of falling: Secondary | ICD-10-CM | POA: Diagnosis not present

## 2014-10-05 DIAGNOSIS — M1990 Unspecified osteoarthritis, unspecified site: Secondary | ICD-10-CM | POA: Diagnosis present

## 2014-10-05 DIAGNOSIS — H579 Unspecified disorder of eye and adnexa: Secondary | ICD-10-CM | POA: Diagnosis not present

## 2014-10-05 DIAGNOSIS — Z7982 Long term (current) use of aspirin: Secondary | ICD-10-CM | POA: Diagnosis not present

## 2014-10-05 DIAGNOSIS — S199XXA Unspecified injury of neck, initial encounter: Secondary | ICD-10-CM | POA: Diagnosis not present

## 2014-10-05 DIAGNOSIS — Z961 Presence of intraocular lens: Secondary | ICD-10-CM | POA: Diagnosis present

## 2014-10-05 DIAGNOSIS — F32A Depression, unspecified: Secondary | ICD-10-CM | POA: Diagnosis present

## 2014-10-05 DIAGNOSIS — Z79899 Other long term (current) drug therapy: Secondary | ICD-10-CM | POA: Diagnosis not present

## 2014-10-05 DIAGNOSIS — R4182 Altered mental status, unspecified: Secondary | ICD-10-CM | POA: Diagnosis not present

## 2014-10-05 DIAGNOSIS — E1165 Type 2 diabetes mellitus with hyperglycemia: Secondary | ICD-10-CM

## 2014-10-05 DIAGNOSIS — N2889 Other specified disorders of kidney and ureter: Secondary | ICD-10-CM | POA: Diagnosis not present

## 2014-10-05 DIAGNOSIS — I152 Hypertension secondary to endocrine disorders: Secondary | ICD-10-CM | POA: Diagnosis present

## 2014-10-05 DIAGNOSIS — S3993XA Unspecified injury of pelvis, initial encounter: Secondary | ICD-10-CM | POA: Diagnosis not present

## 2014-10-05 DIAGNOSIS — R569 Unspecified convulsions: Secondary | ICD-10-CM | POA: Diagnosis not present

## 2014-10-05 DIAGNOSIS — J9811 Atelectasis: Secondary | ICD-10-CM | POA: Diagnosis present

## 2014-10-05 DIAGNOSIS — A419 Sepsis, unspecified organism: Secondary | ICD-10-CM | POA: Diagnosis not present

## 2014-10-05 DIAGNOSIS — I129 Hypertensive chronic kidney disease with stage 1 through stage 4 chronic kidney disease, or unspecified chronic kidney disease: Secondary | ICD-10-CM | POA: Diagnosis present

## 2014-10-05 DIAGNOSIS — Z8673 Personal history of transient ischemic attack (TIA), and cerebral infarction without residual deficits: Secondary | ICD-10-CM | POA: Diagnosis not present

## 2014-10-05 DIAGNOSIS — E1159 Type 2 diabetes mellitus with other circulatory complications: Secondary | ICD-10-CM | POA: Diagnosis present

## 2014-10-05 DIAGNOSIS — D509 Iron deficiency anemia, unspecified: Secondary | ICD-10-CM | POA: Diagnosis present

## 2014-10-05 DIAGNOSIS — G40909 Epilepsy, unspecified, not intractable, without status epilepticus: Secondary | ICD-10-CM

## 2014-10-05 DIAGNOSIS — I517 Cardiomegaly: Secondary | ICD-10-CM | POA: Diagnosis not present

## 2014-10-05 DIAGNOSIS — M1712 Unilateral primary osteoarthritis, left knee: Secondary | ICD-10-CM | POA: Diagnosis not present

## 2014-10-05 DIAGNOSIS — W19XXXA Unspecified fall, initial encounter: Secondary | ICD-10-CM | POA: Diagnosis present

## 2014-10-05 DIAGNOSIS — I10 Essential (primary) hypertension: Secondary | ICD-10-CM | POA: Diagnosis not present

## 2014-10-05 DIAGNOSIS — E1140 Type 2 diabetes mellitus with diabetic neuropathy, unspecified: Secondary | ICD-10-CM | POA: Diagnosis present

## 2014-10-05 DIAGNOSIS — S8991XA Unspecified injury of right lower leg, initial encounter: Secondary | ICD-10-CM | POA: Diagnosis not present

## 2014-10-05 DIAGNOSIS — Z90710 Acquired absence of both cervix and uterus: Secondary | ICD-10-CM | POA: Diagnosis not present

## 2014-10-05 DIAGNOSIS — I639 Cerebral infarction, unspecified: Secondary | ICD-10-CM

## 2014-10-05 DIAGNOSIS — F329 Major depressive disorder, single episode, unspecified: Secondary | ICD-10-CM | POA: Diagnosis not present

## 2014-10-05 DIAGNOSIS — M25461 Effusion, right knee: Secondary | ICD-10-CM | POA: Diagnosis not present

## 2014-10-05 DIAGNOSIS — R5383 Other fatigue: Secondary | ICD-10-CM | POA: Diagnosis not present

## 2014-10-05 DIAGNOSIS — E11329 Type 2 diabetes mellitus with mild nonproliferative diabetic retinopathy without macular edema: Secondary | ICD-10-CM | POA: Diagnosis present

## 2014-10-05 DIAGNOSIS — E119 Type 2 diabetes mellitus without complications: Secondary | ICD-10-CM | POA: Diagnosis not present

## 2014-10-05 DIAGNOSIS — K219 Gastro-esophageal reflux disease without esophagitis: Secondary | ICD-10-CM | POA: Diagnosis present

## 2014-10-05 DIAGNOSIS — Z794 Long term (current) use of insulin: Secondary | ICD-10-CM | POA: Diagnosis not present

## 2014-10-05 DIAGNOSIS — Z9842 Cataract extraction status, left eye: Secondary | ICD-10-CM | POA: Diagnosis not present

## 2014-10-05 DIAGNOSIS — Z85118 Personal history of other malignant neoplasm of bronchus and lung: Secondary | ICD-10-CM | POA: Diagnosis not present

## 2014-10-05 DIAGNOSIS — G9340 Encephalopathy, unspecified: Secondary | ICD-10-CM | POA: Diagnosis not present

## 2014-10-05 DIAGNOSIS — R296 Repeated falls: Secondary | ICD-10-CM | POA: Diagnosis present

## 2014-10-05 DIAGNOSIS — I509 Heart failure, unspecified: Secondary | ICD-10-CM | POA: Diagnosis present

## 2014-10-05 DIAGNOSIS — Z87891 Personal history of nicotine dependence: Secondary | ICD-10-CM | POA: Diagnosis not present

## 2014-10-05 DIAGNOSIS — R102 Pelvic and perineal pain: Secondary | ICD-10-CM | POA: Diagnosis present

## 2014-10-05 DIAGNOSIS — Z85841 Personal history of malignant neoplasm of brain: Secondary | ICD-10-CM | POA: Diagnosis not present

## 2014-10-05 DIAGNOSIS — N390 Urinary tract infection, site not specified: Secondary | ICD-10-CM

## 2014-10-05 DIAGNOSIS — N179 Acute kidney failure, unspecified: Secondary | ICD-10-CM | POA: Diagnosis present

## 2014-10-05 DIAGNOSIS — M11262 Other chondrocalcinosis, left knee: Secondary | ICD-10-CM | POA: Diagnosis not present

## 2014-10-05 DIAGNOSIS — B9689 Other specified bacterial agents as the cause of diseases classified elsewhere: Secondary | ICD-10-CM | POA: Diagnosis not present

## 2014-10-05 DIAGNOSIS — G9389 Other specified disorders of brain: Secondary | ICD-10-CM | POA: Diagnosis not present

## 2014-10-05 DIAGNOSIS — E1169 Type 2 diabetes mellitus with other specified complication: Secondary | ICD-10-CM | POA: Diagnosis present

## 2014-10-05 DIAGNOSIS — R404 Transient alteration of awareness: Secondary | ICD-10-CM | POA: Diagnosis not present

## 2014-10-05 DIAGNOSIS — S8992XA Unspecified injury of left lower leg, initial encounter: Secondary | ICD-10-CM | POA: Diagnosis not present

## 2014-10-05 DIAGNOSIS — E876 Hypokalemia: Secondary | ICD-10-CM | POA: Diagnosis present

## 2014-10-05 DIAGNOSIS — M25562 Pain in left knee: Secondary | ICD-10-CM | POA: Diagnosis not present

## 2014-10-05 DIAGNOSIS — F0390 Unspecified dementia without behavioral disturbance: Secondary | ICD-10-CM | POA: Diagnosis present

## 2014-10-05 DIAGNOSIS — S299XXA Unspecified injury of thorax, initial encounter: Secondary | ICD-10-CM | POA: Diagnosis not present

## 2014-10-05 DIAGNOSIS — M47812 Spondylosis without myelopathy or radiculopathy, cervical region: Secondary | ICD-10-CM | POA: Diagnosis not present

## 2014-10-05 DIAGNOSIS — S79922A Unspecified injury of left thigh, initial encounter: Secondary | ICD-10-CM | POA: Diagnosis not present

## 2014-10-05 DIAGNOSIS — R55 Syncope and collapse: Secondary | ICD-10-CM | POA: Diagnosis not present

## 2014-10-05 DIAGNOSIS — Z9841 Cataract extraction status, right eye: Secondary | ICD-10-CM | POA: Diagnosis not present

## 2014-10-05 DIAGNOSIS — S0990XA Unspecified injury of head, initial encounter: Secondary | ICD-10-CM | POA: Diagnosis not present

## 2014-10-05 DIAGNOSIS — R531 Weakness: Secondary | ICD-10-CM | POA: Diagnosis not present

## 2014-10-05 DIAGNOSIS — R0989 Other specified symptoms and signs involving the circulatory and respiratory systems: Secondary | ICD-10-CM | POA: Diagnosis not present

## 2014-10-05 DIAGNOSIS — N183 Chronic kidney disease, stage 3 (moderate): Secondary | ICD-10-CM | POA: Diagnosis present

## 2014-10-05 MED ORDER — ACETAMINOPHEN-CODEINE #3 300-30 MG PO TABS: 1.0000 | ORAL_TABLET | Freq: Four times a day (QID) | ORAL | Status: DC | PRN

## 2014-10-05 NOTE — ED Provider Notes (Signed)
CSN: [id number]     Arrival date & time 10/05/14  2355 History  This chart was scribed for Everlene Balls, MD by Evelene Croon, ED Scribe. This patient was seen in room D33C/D33C and the patient's care was started 11:58 PM.     Chief Complaint  Patient presents with  . Fall   The history is provided by the patient and the EMS personnel. No language interpreter was used.   HPI Comments:  [name] is a 81 y.o. female brought in by ambulance from home, who presents to the Emergency Department s/p fall about 2240 this evening. Per EMS, pt's family heard the fall and found pt on the ground. Family stated to EMS that she was not responding appropriately for a few minutes; they also noted that pt has been lethargic since yesterday. Pt denies HA, back pain, CP on seen. EMS reports stable vital en route. She also denies recent sickness, vomiting diarrhea, fever and cough. No alleviating factors noted.  Past Medical History  Diagnosis Date  . Diabetes mellitus 2007    HgA1C (02/20/2010) = 9.2, HgA1C (03/20/2009) = 12.1  . CVA (cerebrovascular accident) 4332     right embolic stroke, no residual deficits  . Diverticulitis   . CVA (cerebral infarction) 7-yrs ago  . Arthritis   . Dysphagia   . CHF (congestive heart failure)   . DIABETES MELLITUS, TYPE II 12/04/2005  . HYPERLIPIDEMIA 12/04/2005  . HYPERTENSION 12/04/2005  . GERD 12/04/2005  . ARTHRITIS, KNEE 03/24/2006  . Lung mass 07/08/2010  . Meningioma 07/21/2010  . Hemangioma of liver 12/02/2010  . Depression 12/02/2010  . Adenocarcinoma of lung      Right upper lobe adenocarcinoma. s/p right lower lobectomy 12/15/10  . Brain cancer     7 cm cerebral right atypical meningoma grade II   Past Surgical History  Procedure Laterality Date  . Abdominal hysterectomy    . Video bronchoscope.  12/29/2007    [place]  . Wide excision of left upper back mass.    . Extracapsular cataract extraction with intraocular      lens implantation.  .  Right vats,right thoracotomy,right lower lobectomy with node dissection    . Incision and drainage perirectal abscess N/A 01/19/2013    Procedure: IRRIGATION AND DEBRIDEMENT PERIRECTAL ABSCESS;  Surgeon: Zenovia Jarred, MD;  Location: [place];  Service: General;  Laterality: N/A;  . Craniotomy N/A 03/02/2013    Procedure: CRANIOTOMY TUMOR EXCISION;  Surgeon: Winfield Cunas, MD;  Location: [facility];  Service: Neurosurgery;  Laterality: N/A;  Bifrontal Craniotomy for tumor   Family History  Problem Relation Age of Onset  . Hyperlipidemia Brother   . Hypertension Brother   . Diabetes Brother    Social History  Substance Use Topics  . Smoking status: Former Smoker    Types: Cigarettes    Quit date: 02/17/2000  . Smokeless tobacco: Never Used  . Alcohol Use: No   OB History    No data available     Review of Systems  A complete 10 system review of systems was obtained and all systems are negative except as noted in the HPI and PMH.    Allergies  Review of patient's allergies indicates no known allergies.  Home Medications   Prior to Admission medications   Medication Sig Start Date End Date Taking? Authorizing Provider  acetaminophen-codeine (TYLENOL #3) 300-30 MG per tablet Take 1 tablet by mouth every 6 (six) hours as needed for moderate pain.  10/05/14  Yes Milagros Loll, MD  aspirin EC 81 MG tablet Take 81 mg by mouth daily.   Yes Historical Provider, MD  carvedilol (COREG) 25 MG tablet Take 1 tablet (25 mg total) by mouth 2 (two) times daily with a meal. 03/06/14  Yes Langley Gauss Moding, MD  diclofenac sodium (VOLTAREN) 1 % GEL Apply 4 g topically 4 (four) times daily. Patient taking differently: Apply 4 g topically 4 (four) times daily as needed (pain).  07/30/14  Yes Milagros Loll, MD  FERREX 150 150 MG capsule TAKE 1 CAPSULE BY MOUTH DAILY. 05/21/14  Yes Wilber Oliphant, MD  FLUoxetine (PROZAC) 10 MG capsule TAKE 1 CAPSULE (10 MG TOTAL) BY MOUTH AT BEDTIME. 06/12/14  Yes  Wilber Oliphant, MD  glucose blood (ACCU-CHEK AVIVA) test strip Use to Check Blood Sugars 3 Times Daily.Dx Code: 250.00. Insulin dependent 11/09/13  Yes Annia Belt, MD  insulin aspart (NOVOLOG FLEXPEN) 100 UNIT/ML FlexPen Novolog Insulin: 91 - 160 5 units 161- 230 7 units 231-300 9 units 301- 370 11 units 371 - 440 13 units 441 - 510 15 units 02/01/14  Yes Wilber Oliphant, MD  Insulin Glargine (LANTUS SOLOSTAR) 100 UNIT/ML Solostar Pen Inject 17 Units into the skin at bedtime. 07/02/14  Yes Wilber Oliphant, MD  lamoTRIgine (LAMICTAL) 100 MG tablet Take 1 tablet (100 mg total) by mouth 2 (two) times daily. 09/18/14  Yes Britt Bottom, MD  levETIRAcetam (KEPPRA) 750 MG tablet Take 1 tablet (750 mg total) by mouth 2 (two) times daily. 02/07/14  Yes Oval Linsey, MD  lisinopril (PRINIVIL,ZESTRIL) 10 MG tablet Take 2 tablets (20 mg total) by mouth daily. 09/20/14  Yes Liberty Handy, MD  omeprazole (PRILOSEC) 20 MG capsule Take 1 capsule (20 mg total) by mouth daily. 07/30/14  Yes Milagros Loll, MD  simvastatin (ZOCOR) 20 MG tablet Take 20 mg by mouth daily.   Yes Historical Provider, MD   BP 155/54 mmHg  Pulse 86  Temp(Src) 99 F (37.2 C) (Rectal)  Resp 19  Ht '5\' 4"'$  (1.626 m)  Wt 199 lb (90.266 kg)  BMI 34.14 kg/m2  SpO2 90% Physical Exam  Constitutional: She appears well-developed and well-nourished. No distress.  HENT:  Head: Normocephalic and atraumatic.  Nose: Nose normal.  Mouth/Throat: Oropharynx is clear and moist. No oropharyngeal exudate.  Eyes: Conjunctivae and EOM are normal. No scleral icterus.  irregularly shaped left pupil, non-reactive   Neck: Normal range of motion. Neck supple. No JVD present. No tracheal deviation present. No thyromegaly present.  Cardiovascular: Normal rate, regular rhythm and normal heart sounds.  Exam reveals no gallop and no friction rub.   No murmur heard. Pulmonary/Chest: Effort normal and breath sounds normal. No  respiratory distress. She has no wheezes. She exhibits no tenderness.  Abdominal: Soft. Bowel sounds are normal. She exhibits no distension and no mass. There is no tenderness. There is no rebound and no guarding.  Musculoskeletal: Normal range of motion. She exhibits no edema or tenderness.  Lymphadenopathy:    She has no cervical adenopathy.  Neurological: She is alert. No cranial nerve deficit. She exhibits normal muscle tone.  Normal strength and sensation in all extremities  Skin: Skin is warm and dry. No rash noted. No erythema. No pallor.  Nursing note and vitals reviewed.   ED Course  Procedures   DIAGNOSTIC STUDIES:  Oxygen Saturation is 90% on Room air, low by my interpretation.    COORDINATION OF CARE:  12:04 AM Discussed treatment plan with pt at bedside and pt agreed to plan.  Labs Review Labs Reviewed  CBC WITH DIFFERENTIAL/PLATELET - Abnormal; Notable for the following:    Hemoglobin 10.2 (*)    HCT 33.3 (*)    MCH 24.4 (*)    RDW 15.9 (*)    All other components within normal limits  COMPREHENSIVE METABOLIC PANEL - Abnormal; Notable for the following:    Potassium 3.3 (*)    Glucose, Bld 347 (*)    Creatinine, Ser 1.31 (*)    Calcium 8.2 (*)    Total Protein 6.2 (*)    Albumin 2.9 (*)    AST 14 (*)    ALT 10 (*)    GFR calc non Af Amer 38 (*)    GFR calc Af Amer 44 (*)    All other components within normal limits  LIPASE, BLOOD - Abnormal; Notable for the following:    Lipase 19 (*)    All other components within normal limits  URINALYSIS, ROUTINE W REFLEX MICROSCOPIC (NOT AT [facility]) - Abnormal; Notable for the following:    APPearance TURBID (*)    Glucose, UA >1000 (*)    Hgb urine dipstick TRACE (*)    Ketones, ur 15 (*)    Protein, ur 30 (*)    Nitrite POSITIVE (*)    Leukocytes, UA LARGE (*)    All other components within normal limits  URINE MICROSCOPIC-ADD ON - Abnormal; Notable for the following:    Bacteria, UA MANY (*)    All other  components within normal limits  URINE CULTURE  I-STAT CG4 LACTIC ACID, ED    Imaging Review Dg Chest 2 View  10/06/2014   CLINICAL DATA:  Status post unwitnessed fall. Concern for chest injury. Initial encounter.  EXAM: CHEST  2 VIEW  COMPARISON:  Chest radiograph performed 07/01/2014  FINDINGS: The lungs are well-aerated. Mild right midlung scarring is again noted. There is no evidence of focal opacification, pleural effusion or pneumothorax.  The heart is mildly enlarged. No acute osseous abnormalities are seen.  IMPRESSION: Mild right mid lung scarring again noted.  Mild cardiomegaly.   Electronically Signed   By: Garald Balding M.D.   On: 10/06/2014 01:09   I have personally reviewed and evaluated these images and lab results as part of my medical decision-making.   EKG Interpretation   Date/Time:  Saturday October 06 2014 00:21:21 EDT Ventricular Rate:  86 PR Interval:  138 QRS Duration: 136 QT Interval:  411 QTC Calculation: 492 R Axis:   20 Text Interpretation:  Sinus rhythm LVH with secondary repolarization  abnormality ST depression in Inferior leads T wave inversion Inferior  leads Confirmed by Glynn Octave [phone]) on 10/06/2014 1:06:02 AM       MDM   Final diagnoses:  None   Patient presents to emergency department after a fall. Family states that she has been weak as well.  Will obtain broad workup for evaluation of her fall. Infection is likely. CT scan of the head is also pending for, evaluation. Creatinine is 1.3, slightly above her baseline of 1.0. Glucose is 340 which appears to be her baseline. Patient was ordered 1 L of IV fluids.  Urinalysis reveals an infection. Cultures and sent. Patient given ceftriaxone for treatment. This is likely causing the patient's ultimate status and fall today. She also has a small bump in her creatinine. I spoke with the internal medicine teaching service ounces patient to [place]  for further management.   I personally  performed the services described in this documentation, which was scribed in my presence. The recorded information has been reviewed and is accurate.    Everlene Balls, MD 10/06/14 [phone]

## 2014-10-05 NOTE — ED Notes (Signed)
Per [facility] pt at home tonight and a relative heard her fall. Relative states pt was sweaty and pale and not responding appropriately  For a few minutes. A&O x4. Passed stroke screen with EMS, no deficits. Pt had recent brain tumor removed. Family reports pt has been lethargic since yesterday. 130/72 BP, 83 HR, 97% RA. Pt smells of urine.

## 2014-10-05 NOTE — Telephone Encounter (Addendum)
Returned call to daughter.  Pt has been using cream and tylenol for relief of pain in both legs.  She is using 3 tylenol a day,  1 in AM and 2 at PM.   They are asking is there anything else she can take. The cream works well but tylenol is not helping.  Pt is up walking today.   Pt seen in clinic for same complaint on 6/13. Started on Voltaren gel and tylenol.  Please advise.

## 2014-10-05 NOTE — Telephone Encounter (Signed)
She may increase the dose and take up to 4 tablets per day. Will update the order to reflect this. Thanks.

## 2014-10-06 ENCOUNTER — Emergency Department (HOSPITAL_COMMUNITY): Payer: Medicare Other

## 2014-10-06 ENCOUNTER — Encounter (HOSPITAL_COMMUNITY): Payer: Self-pay | Admitting: Emergency Medicine

## 2014-10-06 DIAGNOSIS — Z9181 History of falling: Secondary | ICD-10-CM | POA: Diagnosis not present

## 2014-10-06 DIAGNOSIS — E1165 Type 2 diabetes mellitus with hyperglycemia: Secondary | ICD-10-CM

## 2014-10-06 DIAGNOSIS — M11262 Other chondrocalcinosis, left knee: Secondary | ICD-10-CM | POA: Diagnosis not present

## 2014-10-06 DIAGNOSIS — I517 Cardiomegaly: Secondary | ICD-10-CM | POA: Diagnosis not present

## 2014-10-06 DIAGNOSIS — Z79899 Other long term (current) drug therapy: Secondary | ICD-10-CM | POA: Diagnosis not present

## 2014-10-06 DIAGNOSIS — I129 Hypertensive chronic kidney disease with stage 1 through stage 4 chronic kidney disease, or unspecified chronic kidney disease: Secondary | ICD-10-CM | POA: Diagnosis present

## 2014-10-06 DIAGNOSIS — R4182 Altered mental status, unspecified: Secondary | ICD-10-CM | POA: Diagnosis present

## 2014-10-06 DIAGNOSIS — M1990 Unspecified osteoarthritis, unspecified site: Secondary | ICD-10-CM | POA: Diagnosis present

## 2014-10-06 DIAGNOSIS — N179 Acute kidney failure, unspecified: Secondary | ICD-10-CM | POA: Diagnosis present

## 2014-10-06 DIAGNOSIS — R0989 Other specified symptoms and signs involving the circulatory and respiratory systems: Secondary | ICD-10-CM | POA: Diagnosis not present

## 2014-10-06 DIAGNOSIS — M47812 Spondylosis without myelopathy or radiculopathy, cervical region: Secondary | ICD-10-CM | POA: Diagnosis not present

## 2014-10-06 DIAGNOSIS — G9340 Encephalopathy, unspecified: Principal | ICD-10-CM

## 2014-10-06 DIAGNOSIS — Z961 Presence of intraocular lens: Secondary | ICD-10-CM | POA: Diagnosis present

## 2014-10-06 DIAGNOSIS — D509 Iron deficiency anemia, unspecified: Secondary | ICD-10-CM | POA: Diagnosis present

## 2014-10-06 DIAGNOSIS — N2889 Other specified disorders of kidney and ureter: Secondary | ICD-10-CM | POA: Diagnosis not present

## 2014-10-06 DIAGNOSIS — F329 Major depressive disorder, single episode, unspecified: Secondary | ICD-10-CM | POA: Diagnosis not present

## 2014-10-06 DIAGNOSIS — H579 Unspecified disorder of eye and adnexa: Secondary | ICD-10-CM | POA: Diagnosis not present

## 2014-10-06 DIAGNOSIS — S0990XA Unspecified injury of head, initial encounter: Secondary | ICD-10-CM | POA: Diagnosis not present

## 2014-10-06 DIAGNOSIS — I509 Heart failure, unspecified: Secondary | ICD-10-CM | POA: Diagnosis present

## 2014-10-06 DIAGNOSIS — I10 Essential (primary) hypertension: Secondary | ICD-10-CM | POA: Diagnosis not present

## 2014-10-06 DIAGNOSIS — G9389 Other specified disorders of brain: Secondary | ICD-10-CM | POA: Diagnosis not present

## 2014-10-06 DIAGNOSIS — J9811 Atelectasis: Secondary | ICD-10-CM | POA: Diagnosis present

## 2014-10-06 DIAGNOSIS — G40909 Epilepsy, unspecified, not intractable, without status epilepticus: Secondary | ICD-10-CM | POA: Diagnosis not present

## 2014-10-06 DIAGNOSIS — Z9841 Cataract extraction status, right eye: Secondary | ICD-10-CM | POA: Diagnosis not present

## 2014-10-06 DIAGNOSIS — K219 Gastro-esophageal reflux disease without esophagitis: Secondary | ICD-10-CM | POA: Diagnosis present

## 2014-10-06 DIAGNOSIS — S79922A Unspecified injury of left thigh, initial encounter: Secondary | ICD-10-CM | POA: Diagnosis not present

## 2014-10-06 DIAGNOSIS — Z8673 Personal history of transient ischemic attack (TIA), and cerebral infarction without residual deficits: Secondary | ICD-10-CM | POA: Diagnosis not present

## 2014-10-06 DIAGNOSIS — S8991XA Unspecified injury of right lower leg, initial encounter: Secondary | ICD-10-CM | POA: Diagnosis not present

## 2014-10-06 DIAGNOSIS — S8992XA Unspecified injury of left lower leg, initial encounter: Secondary | ICD-10-CM | POA: Diagnosis not present

## 2014-10-06 DIAGNOSIS — R569 Unspecified convulsions: Secondary | ICD-10-CM | POA: Diagnosis not present

## 2014-10-06 DIAGNOSIS — M25562 Pain in left knee: Secondary | ICD-10-CM | POA: Diagnosis not present

## 2014-10-06 DIAGNOSIS — E785 Hyperlipidemia, unspecified: Secondary | ICD-10-CM | POA: Diagnosis present

## 2014-10-06 DIAGNOSIS — R55 Syncope and collapse: Secondary | ICD-10-CM | POA: Diagnosis not present

## 2014-10-06 DIAGNOSIS — B9689 Other specified bacterial agents as the cause of diseases classified elsewhere: Secondary | ICD-10-CM | POA: Diagnosis not present

## 2014-10-06 DIAGNOSIS — Z85118 Personal history of other malignant neoplasm of bronchus and lung: Secondary | ICD-10-CM | POA: Diagnosis not present

## 2014-10-06 DIAGNOSIS — Z87891 Personal history of nicotine dependence: Secondary | ICD-10-CM | POA: Diagnosis not present

## 2014-10-06 DIAGNOSIS — Z85841 Personal history of malignant neoplasm of brain: Secondary | ICD-10-CM | POA: Diagnosis not present

## 2014-10-06 DIAGNOSIS — A419 Sepsis, unspecified organism: Secondary | ICD-10-CM | POA: Diagnosis not present

## 2014-10-06 DIAGNOSIS — Z7982 Long term (current) use of aspirin: Secondary | ICD-10-CM | POA: Diagnosis not present

## 2014-10-06 DIAGNOSIS — N183 Chronic kidney disease, stage 3 (moderate): Secondary | ICD-10-CM | POA: Diagnosis present

## 2014-10-06 DIAGNOSIS — M1712 Unilateral primary osteoarthritis, left knee: Secondary | ICD-10-CM | POA: Diagnosis not present

## 2014-10-06 DIAGNOSIS — Z90710 Acquired absence of both cervix and uterus: Secondary | ICD-10-CM | POA: Diagnosis not present

## 2014-10-06 DIAGNOSIS — W19XXXA Unspecified fall, initial encounter: Secondary | ICD-10-CM | POA: Diagnosis present

## 2014-10-06 DIAGNOSIS — E119 Type 2 diabetes mellitus without complications: Secondary | ICD-10-CM | POA: Diagnosis not present

## 2014-10-06 DIAGNOSIS — S3993XA Unspecified injury of pelvis, initial encounter: Secondary | ICD-10-CM | POA: Diagnosis not present

## 2014-10-06 DIAGNOSIS — N390 Urinary tract infection, site not specified: Secondary | ICD-10-CM | POA: Diagnosis present

## 2014-10-06 DIAGNOSIS — R5383 Other fatigue: Secondary | ICD-10-CM | POA: Diagnosis not present

## 2014-10-06 DIAGNOSIS — R296 Repeated falls: Secondary | ICD-10-CM | POA: Diagnosis present

## 2014-10-06 DIAGNOSIS — Z9842 Cataract extraction status, left eye: Secondary | ICD-10-CM | POA: Diagnosis not present

## 2014-10-06 DIAGNOSIS — R509 Fever, unspecified: Secondary | ICD-10-CM | POA: Diagnosis not present

## 2014-10-06 DIAGNOSIS — S299XXA Unspecified injury of thorax, initial encounter: Secondary | ICD-10-CM | POA: Diagnosis not present

## 2014-10-06 DIAGNOSIS — E876 Hypokalemia: Secondary | ICD-10-CM | POA: Diagnosis present

## 2014-10-06 DIAGNOSIS — Z794 Long term (current) use of insulin: Secondary | ICD-10-CM | POA: Diagnosis not present

## 2014-10-06 DIAGNOSIS — E11329 Type 2 diabetes mellitus with mild nonproliferative diabetic retinopathy without macular edema: Secondary | ICD-10-CM | POA: Diagnosis present

## 2014-10-06 DIAGNOSIS — S199XXA Unspecified injury of neck, initial encounter: Secondary | ICD-10-CM | POA: Diagnosis not present

## 2014-10-06 DIAGNOSIS — M25461 Effusion, right knee: Secondary | ICD-10-CM | POA: Diagnosis not present

## 2014-10-06 DIAGNOSIS — F0390 Unspecified dementia without behavioral disturbance: Secondary | ICD-10-CM | POA: Diagnosis present

## 2014-10-06 DIAGNOSIS — R102 Pelvic and perineal pain: Secondary | ICD-10-CM | POA: Diagnosis present

## 2014-10-06 DIAGNOSIS — R531 Weakness: Secondary | ICD-10-CM | POA: Diagnosis not present

## 2014-10-06 LAB — URINALYSIS, ROUTINE W REFLEX MICROSCOPIC
Bilirubin Urine: NEGATIVE
Glucose, UA: >1000 mg/dL — AB
KETONES UR: 15 mg/dL — AB
NITRITE: POSITIVE — AB
PROTEIN: 30 mg/dL — AB
Specific Gravity, Urine: 1.025 (ref 1.005–1.030)
UROBILINOGEN UA: 1 mg/dL (ref 0.0–1.0)
pH: 5.5 (ref 5.0–8.0)

## 2014-10-06 LAB — BLOOD GAS, ARTERIAL
Acid-Base Excess: 0.3 mmol/L (ref 0.0–2.0)
BICARBONATE: 23.8 meq/L (ref 20.0–24.0)
Drawn by: 24486
O2 Content: 2 L/min
O2 Saturation: 95.9 %
PCO2 ART: 38.7 mmHg (ref 35.0–45.0)
PO2 ART: 84.1 mmHg (ref 80.0–100.0)
Patient temperature: 102.9
TCO2: 24.9 mmol/L (ref 0–100)
pH, Arterial: 7.418 (ref 7.350–7.450)

## 2014-10-06 LAB — LIPASE, BLOOD: LIPASE: 19 U/L — AB (ref 22–51)

## 2014-10-06 LAB — CBC WITH DIFFERENTIAL/PLATELET
BASOS ABS: 0 10*3/uL (ref 0.0–0.1)
BASOS PCT: 0 % (ref 0–1)
EOS ABS: 0.1 10*3/uL (ref 0.0–0.7)
Eosinophils Relative: 1 % (ref 0–5)
HCT: 33.3 % — ABNORMAL LOW (ref 36.0–46.0)
Hemoglobin: 10.2 g/dL — ABNORMAL LOW (ref 12.0–15.0)
Lymphocytes Relative: 19 % (ref 12–46)
Lymphs Abs: 1.3 10*3/uL (ref 0.7–4.0)
MCH: 24.4 pg — AB (ref 26.0–34.0)
MCHC: 30.6 g/dL (ref 30.0–36.0)
MCV: 79.7 fL (ref 78.0–100.0)
MONO ABS: 0.7 10*3/uL (ref 0.1–1.0)
Monocytes Relative: 10 % (ref 3–12)
NEUTROS ABS: 5 10*3/uL (ref 1.7–7.7)
NEUTROS PCT: 70 % (ref 43–77)
PLATELETS: 247 10*3/uL (ref 150–400)
RBC: 4.18 MIL/uL (ref 3.87–5.11)
RDW: 15.9 % — AB (ref 11.5–15.5)
WBC: 7.1 10*3/uL (ref 4.0–10.5)

## 2014-10-06 LAB — COMPREHENSIVE METABOLIC PANEL
ALT: 10 U/L — ABNORMAL LOW (ref 14–54)
AST: 14 U/L — ABNORMAL LOW (ref 15–41)
Albumin: 2.9 g/dL — ABNORMAL LOW (ref 3.5–5.0)
Alkaline Phosphatase: 63 U/L (ref 38–126)
Anion gap: 10 (ref 5–15)
BUN: 19 mg/dL (ref 6–20)
CHLORIDE: 103 mmol/L (ref 101–111)
CO2: 27 mmol/L (ref 22–32)
Calcium: 8.2 mg/dL — ABNORMAL LOW (ref 8.9–10.3)
Creatinine, Ser: 1.31 mg/dL — ABNORMAL HIGH (ref 0.44–1.00)
GFR, EST AFRICAN AMERICAN: 44 mL/min — AB (ref >60)
GFR, EST NON AFRICAN AMERICAN: 38 mL/min — AB (ref >60)
Glucose, Bld: 347 mg/dL — ABNORMAL HIGH (ref 65–99)
POTASSIUM: 3.3 mmol/L — AB (ref 3.5–5.1)
SODIUM: 140 mmol/L (ref 135–145)
Total Bilirubin: 0.6 mg/dL (ref 0.3–1.2)
Total Protein: 6.2 g/dL — ABNORMAL LOW (ref 6.5–8.1)

## 2014-10-06 LAB — CBC
HEMATOCRIT: 31.7 % — AB (ref 36.0–46.0)
HEMOGLOBIN: 9.6 g/dL — AB (ref 12.0–15.0)
MCH: 24 pg — ABNORMAL LOW (ref 26.0–34.0)
MCHC: 30.3 g/dL (ref 30.0–36.0)
MCV: 79.3 fL (ref 78.0–100.0)
Platelets: 255 10*3/uL (ref 150–400)
RBC: 4 MIL/uL (ref 3.87–5.11)
RDW: 16.1 % — ABNORMAL HIGH (ref 11.5–15.5)
WBC: 7.2 10*3/uL (ref 4.0–10.5)

## 2014-10-06 LAB — URINE MICROSCOPIC-ADD ON

## 2014-10-06 LAB — BASIC METABOLIC PANEL
ANION GAP: 7 (ref 5–15)
BUN: 16 mg/dL (ref 6–20)
CALCIUM: 7.9 mg/dL — AB (ref 8.9–10.3)
CO2: 28 mmol/L (ref 22–32)
Chloride: 105 mmol/L (ref 101–111)
Creatinine, Ser: 1.07 mg/dL — ABNORMAL HIGH (ref 0.44–1.00)
GFR, EST AFRICAN AMERICAN: 56 mL/min — AB (ref >60)
GFR, EST NON AFRICAN AMERICAN: 48 mL/min — AB (ref >60)
Glucose, Bld: 273 mg/dL — ABNORMAL HIGH (ref 65–99)
POTASSIUM: 3.2 mmol/L — AB (ref 3.5–5.1)
SODIUM: 140 mmol/L (ref 135–145)

## 2014-10-06 LAB — GLUCOSE, CAPILLARY
GLUCOSE-CAPILLARY: 296 mg/dL — AB (ref 65–99)
GLUCOSE-CAPILLARY: 247 mg/dL — AB (ref 65–99)
GLUCOSE-CAPILLARY: 157 mg/dL — AB (ref 65–99)
Glucose-Capillary: 151 mg/dL — ABNORMAL HIGH (ref 65–99)
Glucose-Capillary: 144 mg/dL — ABNORMAL HIGH (ref 65–99)

## 2014-10-06 LAB — I-STAT CG4 LACTIC ACID, ED: LACTIC ACID, VENOUS: 1.61 mmol/L (ref 0.5–2.0)

## 2014-10-06 LAB — MAGNESIUM: MAGNESIUM: 1.7 mg/dL (ref 1.7–2.4)

## 2014-10-06 MED ORDER — CARVEDILOL 25 MG PO TABS
25.0000 mg | ORAL_TABLET | Freq: Two times a day (BID) | ORAL | Status: DC
  Administered 2014-10-06 – 2014-10-12 (×12): 25 mg via ORAL
  Filled 2014-10-06 (×14): qty 1

## 2014-10-06 MED ORDER — INSULIN ASPART 100 UNIT/ML ~~LOC~~ SOLN
0.0000 [IU] | Freq: Three times a day (TID) | SUBCUTANEOUS | Status: DC
  Administered 2014-10-06: 2 [IU] via SUBCUTANEOUS
  Administered 2014-10-06: 3 [IU] via SUBCUTANEOUS
  Administered 2014-10-07 (×2): 2 [IU] via SUBCUTANEOUS
  Administered 2014-10-08: 1 [IU] via SUBCUTANEOUS
  Administered 2014-10-08 (×2): 2 [IU] via SUBCUTANEOUS
  Administered 2014-10-09 (×2): 1 [IU] via SUBCUTANEOUS
  Administered 2014-10-12: 2 [IU] via SUBCUTANEOUS

## 2014-10-06 MED ORDER — POTASSIUM CHLORIDE CRYS ER 20 MEQ PO TBCR: 40.0000 meq | EXTENDED_RELEASE_TABLET | Freq: Once | ORAL | Status: DC

## 2014-10-06 MED ORDER — LAMOTRIGINE 100 MG PO TABS
100.0000 mg | ORAL_TABLET | Freq: Two times a day (BID) | ORAL | Status: DC
  Administered 2014-10-06 – 2014-10-09 (×7): 100 mg via ORAL
  Filled 2014-10-06 (×8): qty 1

## 2014-10-06 MED ORDER — ENOXAPARIN SODIUM 40 MG/0.4ML ~~LOC~~ SOLN
40.0000 mg | Freq: Every day | SUBCUTANEOUS | Status: DC
  Administered 2014-10-06 – 2014-10-12 (×7): 40 mg via SUBCUTANEOUS
  Filled 2014-10-06 (×8): qty 0.4

## 2014-10-06 MED ORDER — CEFTRIAXONE SODIUM 1 G IJ SOLR
1.0000 g | Freq: Once | INTRAMUSCULAR | Status: AC
  Administered 2014-10-06: 1 g via INTRAVENOUS
  Filled 2014-10-06: qty 10

## 2014-10-06 MED ORDER — VANCOMYCIN HCL 10 G IV SOLR
1500.0000 mg | INTRAVENOUS | Status: DC
  Administered 2014-10-06 – 2014-10-09 (×4): 1500 mg via INTRAVENOUS
  Filled 2014-10-06 (×5): qty 1500

## 2014-10-06 MED ORDER — LEVETIRACETAM 750 MG PO TABS
750.0000 mg | ORAL_TABLET | Freq: Two times a day (BID) | ORAL | Status: DC
  Administered 2014-10-06: 750 mg via ORAL
  Filled 2014-10-06 (×2): qty 1

## 2014-10-06 MED ORDER — POTASSIUM CHLORIDE CRYS ER 20 MEQ PO TBCR
40.0000 meq | EXTENDED_RELEASE_TABLET | Freq: Once | ORAL | Status: AC
  Administered 2014-10-06: 40 meq via ORAL
  Filled 2014-10-06: qty 2

## 2014-10-06 MED ORDER — INSULIN ASPART 100 UNIT/ML ~~LOC~~ SOLN: 0.0000 [IU] | Freq: Every day | SUBCUTANEOUS | Status: DC

## 2014-10-06 MED ORDER — ACETAMINOPHEN 650 MG RE SUPP
650.0000 mg | RECTAL | Status: DC | PRN
  Administered 2014-10-06 – 2014-10-07 (×2): 650 mg via RECTAL
  Filled 2014-10-06 (×2): qty 1

## 2014-10-06 MED ORDER — LISINOPRIL 20 MG PO TABS
20.0000 mg | ORAL_TABLET | Freq: Every day | ORAL | Status: DC
  Administered 2014-10-06 – 2014-10-11 (×6): 20 mg via ORAL
  Filled 2014-10-06 (×6): qty 1

## 2014-10-06 MED ORDER — ASPIRIN EC 81 MG PO TBEC
81.0000 mg | DELAYED_RELEASE_TABLET | Freq: Every day | ORAL | Status: DC
  Administered 2014-10-06 – 2014-10-12 (×7): 81 mg via ORAL
  Filled 2014-10-06 (×7): qty 1

## 2014-10-06 MED ORDER — SODIUM CHLORIDE 0.9 % IV SOLN
750.0000 mg | Freq: Two times a day (BID) | INTRAVENOUS | Status: DC
  Administered 2014-10-06 – 2014-10-11 (×10): 750 mg via INTRAVENOUS
  Filled 2014-10-06 (×11): qty 7.5

## 2014-10-06 MED ORDER — SODIUM CHLORIDE 0.9 % IV SOLN
INTRAVENOUS | Status: AC
  Administered 2014-10-06: 06:00:00 via INTRAVENOUS

## 2014-10-06 MED ORDER — DEXTROSE 5 % IV SOLN
1.0000 g | Freq: Every day | INTRAVENOUS | Status: DC
  Administered 2014-10-07: 1 g via INTRAVENOUS
  Filled 2014-10-06 (×2): qty 10

## 2014-10-06 MED ORDER — INSULIN GLARGINE 100 UNIT/ML ~~LOC~~ SOLN
10.0000 [IU] | Freq: Every day | SUBCUTANEOUS | Status: DC
  Administered 2014-10-06: 10 [IU] via SUBCUTANEOUS
  Administered 2014-10-07: 5 [IU] via SUBCUTANEOUS
  Administered 2014-10-08 – 2014-10-09 (×2): 10 [IU] via SUBCUTANEOUS
  Filled 2014-10-06 (×5): qty 0.1

## 2014-10-06 MED ORDER — SIMVASTATIN 20 MG PO TABS
20.0000 mg | ORAL_TABLET | Freq: Every day | ORAL | Status: DC
  Administered 2014-10-07 – 2014-10-11 (×4): 20 mg via ORAL
  Filled 2014-10-06 (×6): qty 1

## 2014-10-06 MED ORDER — FLUOXETINE HCL 10 MG PO CAPS
10.0000 mg | ORAL_CAPSULE | Freq: Every day | ORAL | Status: DC
  Filled 2014-10-06: qty 1

## 2014-10-06 MED ORDER — SODIUM CHLORIDE 0.9 % IV BOLUS (SEPSIS)
1000.0000 mL | Freq: Once | INTRAVENOUS | Status: AC
  Administered 2014-10-06: 1000 mL via INTRAVENOUS

## 2014-10-06 NOTE — H&P (Signed)
Date: 10/06/2014               Patient Name:  [name] MRN: [id number]  DOB: 04-24-1933 Age / Sex: 81 y.o., female   PCP: Liberty Handy, MD         Medical Service: Internal Medicine Teaching Service         Attending Physician: Dr. Axel Filler, MD    First Contact: Kalman Shan Pager: [phone]  Second Contact: Dr. Duwaine Maxin Pager: [phone]       After Hours (After 5p/  First Contact Pager: [phone]  weekends / holidays): Second Contact Pager: [phone]   Chief Complaint: altered mental status  History of Present Illness: [name] is a 81 y.o. AA female with past medical history of diabetes mellitus type II, diverticulitis, hyperlipidemia, hypertension, GERD, meningioma w/ seizures (2012), and previous UTIs who presented to the emergency department with altered mental status.  At time of our evaluation, patient was the only person available in the room to provide historical information.  Per ED notes, patient presented to the emergency department after a fall at home and having altered mental status and reported lethargy since the day prior.  During our exam of the patient, she denied having a recent fall, stating that she had fallen a few weeks prior.  In the ED, patient did have a CT head that showed no evidence of intracranial injury or fracture.  She also had a 2 view chest xray that showed mild lung scarring and mild cardiomegaly but did not show an acute process or pneumonia.  Urinalysis performed was indicative of UTI with many bacteria, large amount of leukocytes, and positive nitrites, WBC too numerous to count, and rare squamous epithelial cells.  Urine culture has been sent.  Patient reports that she has been experiencing some dysuria for about two weeks now, but denies any increased frequency, burning with urination.  She also denies any fever, nausea, vomiting, diarrhea, and states her bowels have been working fine.  She does endorse having chills but  also states that .  She remains slightly altered.  She was alert and oriented to person and time, but remains confused as to place thinking that she is currently at home.  She received a dose of ceftriaxone 1 gm IV in the emergency department and 1L NS bolus.  Meds: Current Facility-Administered Medications  Medication Dose Route Frequency Provider Last Rate Last Dose  . potassium chloride SA (K-DUR,KLOR-CON) CR tablet 40 mEq  40 mEq Oral Once Corky Sox, MD       Current Outpatient Prescriptions  Medication Sig Dispense Refill  . acetaminophen-codeine (TYLENOL #3) 300-30 MG per tablet Take 1 tablet by mouth every 6 (six) hours as needed for moderate pain. 90 tablet 0  . aspirin EC 81 MG tablet Take 81 mg by mouth daily.    . carvedilol (COREG) 25 MG tablet Take 1 tablet (25 mg total) by mouth 2 (two) times daily with a meal. 180 tablet 4  . diclofenac sodium (VOLTAREN) 1 % GEL Apply 4 g topically 4 (four) times daily. (Patient taking differently: Apply 4 g topically 4 (four) times daily as needed (pain). ) 100 g 1  . FERREX 150 150 MG capsule TAKE 1 CAPSULE BY MOUTH DAILY. 30 capsule 3  . FLUoxetine (PROZAC) 10 MG capsule TAKE 1 CAPSULE (10 MG TOTAL) BY MOUTH AT BEDTIME. 30 capsule 1  . glucose blood (ACCU-CHEK AVIVA) test strip Use to Check  Blood Sugars 3 Times Daily.Dx Code: 250.00. Insulin dependent 100 each 12  . insulin aspart (NOVOLOG FLEXPEN) 100 UNIT/ML FlexPen Novolog Insulin: 91 - 160 5 units 161- 230 7 units 231-300 9 units 301- 370 11 units 371 - 440 13 units 441 - 510 15 units 15 mL 11  . Insulin Glargine (LANTUS SOLOSTAR) 100 UNIT/ML Solostar Pen Inject 17 Units into the skin at bedtime. 10 mL 2  . lamoTRIgine (LAMICTAL) 100 MG tablet Take 1 tablet (100 mg total) by mouth 2 (two) times daily. 60 tablet 11  . levETIRAcetam (KEPPRA) 750 MG tablet Take 1 tablet (750 mg total) by mouth 2 (two) times daily. 60 tablet 11  . lisinopril (PRINIVIL,ZESTRIL) 10 MG tablet  Take 2 tablets (20 mg total) by mouth daily. 180 tablet 3  . omeprazole (PRILOSEC) 20 MG capsule Take 1 capsule (20 mg total) by mouth daily. 30 capsule 3  . simvastatin (ZOCOR) 20 MG tablet Take 20 mg by mouth daily.      Allergies: Allergies as of 10/05/2014  . (No Known Allergies)   Past Medical History  Diagnosis Date  . Diabetes mellitus 2007    HgA1C (02/20/2010) = 9.2, HgA1C (03/20/2009) = 12.1  . CVA (cerebrovascular accident) 8921     right embolic stroke, no residual deficits  . Diverticulitis   . CVA (cerebral infarction) 7-yrs ago  . Arthritis   . Dysphagia   . CHF (congestive heart failure)   . DIABETES MELLITUS, TYPE II 12/04/2005  . HYPERLIPIDEMIA 12/04/2005  . HYPERTENSION 12/04/2005  . GERD 12/04/2005  . ARTHRITIS, KNEE 03/24/2006  . Lung mass 07/08/2010  . Meningioma 07/21/2010  . Hemangioma of liver 12/02/2010  . Depression 12/02/2010  . Adenocarcinoma of lung      Right upper lobe adenocarcinoma. s/p right lower lobectomy 12/15/10  . Brain cancer     7 cm cerebral right atypical meningoma grade II   Past Surgical History  Procedure Laterality Date  . Abdominal hysterectomy    . Video bronchoscope.  12/29/2007    [place]  . Wide excision of left upper back mass.    . Extracapsular cataract extraction with intraocular      lens implantation.  . Right vats,right thoracotomy,right lower lobectomy with node dissection    . Incision and drainage perirectal abscess N/A 01/19/2013    Procedure: IRRIGATION AND DEBRIDEMENT PERIRECTAL ABSCESS;  Surgeon: Zenovia Jarred, MD;  Location: [place];  Service: General;  Laterality: N/A;  . Craniotomy N/A 03/02/2013    Procedure: CRANIOTOMY TUMOR EXCISION;  Surgeon: Winfield Cunas, MD;  Location: [place] [facility];  Service: Neurosurgery;  Laterality: N/A;  Bifrontal Craniotomy for tumor   Family History  Problem Relation Age of Onset  . Hyperlipidemia Brother   . Hypertension Brother   . Diabetes Brother    Social History    Social History  . Marital Status: Widowed    Spouse Name: N/A  . Number of Children: 5  . Years of Education: 10   Occupational History  .  Unemployed   Social History Main Topics  . Smoking status: Former Smoker    Types: Cigarettes    Quit date: 02/17/2000  . Smokeless tobacco: Never Used  . Alcohol Use: No  . Drug Use: No  . Sexual Activity: Not Currently   Other Topics Concern  . Not on file   Social History Narrative   Patient requests to use [employer] [place] for her diabetes testing supplies as of 09/05/2009.  Lives at home with daughter, [name].   Caffeine use: 1 cup coffee/day       Review of Systems: Review of Systems  Constitutional: Positive for chills. Negative for fever, weight loss and diaphoresis.  Respiratory: Negative for cough, sputum production, shortness of breath and wheezing.   Cardiovascular: Negative for chest pain, palpitations and leg swelling.  Gastrointestinal: Positive for abdominal pain. Negative for heartburn, nausea and vomiting.       Suprapubic discomfort  Genitourinary: Positive for dysuria. Negative for urgency, frequency and flank pain.  Musculoskeletal: Positive for falls (unwitnessed). Negative for back pain.  Neurological: Negative for dizziness, tingling, speech change and loss of consciousness.     Physical Exam: Blood pressure 178/71, pulse 81, temperature 99 F (37.2 C), temperature source Rectal, resp. rate 16, height '5\' 4"'$  (1.626 m), weight 199 lb (90.266 kg), SpO2 100 %.  Physical Exam  Constitutional: She appears well-developed and well-nourished. No distress.  HENT:  Head: Normocephalic and atraumatic.  Eyes: Conjunctivae and EOM are normal.  Neck: Normal range of motion.  Cardiovascular: Normal rate, regular rhythm, normal heart sounds and intact distal pulses.   Respiratory: Effort normal and breath sounds normal. No respiratory distress.  GI: Soft. Bowel sounds are normal. She exhibits no  distension and no mass. There is tenderness (suprapubic). There is no guarding.  Lymphadenopathy:    She has no cervical adenopathy.  Neurological: She is alert. She has normal strength.  Oriented to person and time.  Not to place.  Skin: Skin is warm, dry and intact. She is not diaphoretic.    Lab results: Basic Metabolic Panel:  Recent Labs  10/06/14 0019  NA 140  K 3.3*  CL 103  CO2 27  GLUCOSE 347*  BUN 19  CREATININE 1.31*  CALCIUM 8.2*   Liver Function Tests:  Recent Labs  10/06/14 0019  AST 14*  ALT 10*  ALKPHOS 63  BILITOT 0.6  PROT 6.2*  ALBUMIN 2.9*    Recent Labs  10/06/14 0019  LIPASE 19*   CBC:  Recent Labs  10/06/14 0019  WBC 7.1  NEUTROABS 5.0  HGB 10.2*  HCT 33.3*  MCV 79.7  PLT 247   Urinalysis:  Recent Labs  10/06/14 0149  COLORURINE YELLOW  LABSPEC 1.025  PHURINE 5.5  GLUCOSEU >1000*  HGBUR TRACE*  BILIRUBINUR NEGATIVE  KETONESUR 15*  PROTEINUR 30*  UROBILINOGEN 1.0  NITRITE POSITIVE*  LEUKOCYTESUR LARGE*    Imaging results:  Dg Chest 2 View  10/06/2014   CLINICAL DATA:  Status post unwitnessed fall. Concern for chest injury. Initial encounter.  EXAM: CHEST  2 VIEW  COMPARISON:  Chest radiograph performed 07/01/2014  FINDINGS: The lungs are well-aerated. Mild right midlung scarring is again noted. There is no evidence of focal opacification, pleural effusion or pneumothorax.  The heart is mildly enlarged. No acute osseous abnormalities are seen.  IMPRESSION: Mild right mid lung scarring again noted.  Mild cardiomegaly.   Electronically Signed   By: Garald Balding M.D.   On: 10/06/2014 01:09   Ct Head Wo Contrast  10/06/2014   CLINICAL DATA:  Unwitnessed fall.  EXAM: CT HEAD WITHOUT CONTRAST  TECHNIQUE: Contiguous axial images were obtained from the base of the skull through the vertex without intravenous contrast.  COMPARISON:  06/21/2014  FINDINGS: Skull and Sinuses:Status post frontal craniotomy for excision of atypical  meningioma. No evidence of fracture.  Mild mucosal edema in the paranasal sinuses.  Orbits: No acute abnormality.  Brain: Encephalomalacia  and gliosis in the right more than left frontal lobes has a stable appearance. This is where atypical meningioma likely invaded the parenchyma. No gross recurrence of the atypical meningioma.  Cerebral volume loss with ventriculomegaly greatest in the frontal regions. Remote perforator infarct affecting the right putamen and corona radiata. Small remote right parietal cortex infarct.  No acute infarct, hemorrhage, hydrocephalus, or shift  IMPRESSION: 1. No evidence of intracranial injury or fracture. 2. Stable ischemic and postsurgical changes.   Electronically Signed   By: Monte Fantasia M.D.   On: 10/06/2014 02:01    Other results: EKG: Sinus rhythm, LVH, ST depression in Inferior leads, T wave inversion Inferior leads  Assessment & Plan by Problem: Active Problems:   UTI (lower urinary tract infection)   81 y.o. AA female with past medical history of diabetes mellitus type II, diverticulitis, hyperlipidemia, hypertension, GERD, meningioma w/ seizures (2012), and previous UTIs who presented to the emergency department with altered mental status.   Altered mental status -81 yo female with prior history of pan-sensitive E. Coli and Klebsiella UTI who presents with altered mental status and unwitnessed fall from home.  CT head negative to rule out any acute intracranial abnormality.  Chest xray without evidence of infection.  Urinalysis was indicative of UTI and likely source of patients altered mental status.  She is afebrile, her WBC is normal, she denies any fever, nausea or vomiting.  She does endorse some dysuria and suprapubic abdominal pain.  She was started on ceftriaxone 1gm IV in the ED.  Lipase, liver enzymes, total bili all normal in setting of abdominal pain to rule out pancreatic or biliary source of her discomfort -urine cultures pending -continue  ceftriaxone 1gm IV daily -CBC in the morning to trend WBC -neuro checks q4h  AKI -mild AKI with creatine of 1.3.  Appears her baseline is around 1.0.  This is likely 2/2 urinary tract infection -NS IV fluids @ 128m/hr x 12 hours -check BMP in the morning -patient also had a mild hypokalemia on admission of 3.3 so has supplemented with 42m PO potassium choloride.  WIll also check a magnesium -hold lisinopril  Hypertension -she is hypertensive on admission at 178/71 but suspect she did not take her BP meds today.  We will restart her home BP meds with the exception of lisinopril and continue to monitor closely -carvedilol '25mg'$  bid  Diabetes mellitus -poorly controlled with last Hgb A1c of 10.3 in May 2016 and glucose today of 347 -SSI with HS coverage and Lantus 10 units at bedtime.  She is on Lantus 17 units at home but will provide a lower dose while patient is NPO and with decreased intake -will check Hgb A1c and recommend close outpatient follow up with her PCP for tighter glucose control  Hyperlipidemia -continue simvastatin '20mg'$  daily  -last lipid panel was 09/2013: TC 165, TG 115, HDL 41, LDL 101 -recommend lipid panel at her follow up PCP visit  Depression -continue home Prozac '10mg'$  daily at bedtime  Iron deficiency anemia -hemoglobin on admission of 10.2, HCT 33.3, MCV 79.7 consistent with IDA.  Her baseline for the past 18 months has ranged from 9.2-11.8 -Anemia panel from February 2016 showed an iron of 20, TIBC 294, ferritin 38 -patient is on Ferrex '150mg'$  daily.  Will hold for now  CVA -right embolic stroke with no residual effects -continue home aspirin '81mg'$  daily  Meningioma with history of seizures -continue Lamictal '100mg'$  bid and Keppra '750mg'$  bid  Diet: NPO  DVT: Lovenox  Code: Full  Dispo: Disposition is deferred at this time, awaiting improvement of current medical problems. Anticipated discharge in approximately 2-3 day(s).   The patient does have a  current PCP Liberty Handy, MD) and does need an [facility] hospital follow-up appointment after discharge.  The patient does not have transportation limitations that hinder transportation to clinic appointments.  Signed: Jule Ser, DO 10/06/2014, 3:31 AM

## 2014-10-06 NOTE — Progress Notes (Addendum)
PT Cancellation Note  Patient Details Name: [name] MRN: [id number] DOB: 1933-07-14   Cancelled Treatment:    Reason Eval/Treat Not Completed: Patient not medically ready (Pt has bed rest order in effect until 3:47pm. ).  PT will continue to follow pt acutely and will complete evaluation once appropriate.  Thank you for this order.   Joslyn Hy PT, DPT [phone] Pager: [phone] 10/06/2014, 8:38 AM

## 2014-10-06 NOTE — ED Notes (Signed)
IM MD at bedside.

## 2014-10-06 NOTE — Progress Notes (Signed)
ANTIBIOTIC CONSULT NOTE - INITIAL  Pharmacy Consult for Vanco Indication: UTI  No Known Allergies  Patient Measurements: Height: '5\' 4"'$  (162.6 cm) Weight: 204 lb 8 oz (92.761 kg) IBW/kg (Calculated) : 54.7 Adjusted Body Weight:   Vital Signs: Temp: 104.4 F (40.2 C) (08/20 1858) Temp Source: Rectal (08/20 1858) BP: 202/70 mmHg (08/20 1832) Pulse Rate: 98 (08/20 1800) Intake/Output from previous day: 08/19 0701 - 08/20 0700 In: 375 [I.V.:375] Out: -  Intake/Output from this shift:    Labs:  Recent Labs  10/06/14 0019 10/06/14 0443  WBC 7.1 7.2  HGB 10.2* 9.6*  PLT 247 255  CREATININE 1.31* 1.07*   Estimated Creatinine Clearance: 47 mL/min (by C-G formula based on Cr of 1.07). No results for input(s): VANCOTROUGH, VANCOPEAK, VANCORANDOM, GENTTROUGH, GENTPEAK, GENTRANDOM, TOBRATROUGH, TOBRAPEAK, TOBRARND, AMIKACINPEAK, AMIKACINTROU, AMIKACIN in the last 72 hours.   Microbiology: No results found for this or any previous visit (from the past 720 hour(s)).  Medical History: Past Medical History  Diagnosis Date  . Diabetes mellitus 2007    HgA1C (02/20/2010) = 9.2, HgA1C (03/20/2009) = 12.1  . CVA (cerebrovascular accident) 7893     right embolic stroke, no residual deficits  . Diverticulitis   . CVA (cerebral infarction) 7-yrs ago  . Arthritis   . Dysphagia   . CHF (congestive heart failure)   . DIABETES MELLITUS, TYPE II 12/04/2005  . HYPERLIPIDEMIA 12/04/2005  . HYPERTENSION 12/04/2005  . GERD 12/04/2005  . ARTHRITIS, KNEE 03/24/2006  . Lung mass 07/08/2010  . Meningioma 07/21/2010  . Hemangioma of liver 12/02/2010  . Depression 12/02/2010  . Adenocarcinoma of lung      Right upper lobe adenocarcinoma. s/p right lower lobectomy 12/15/10  . Brain cancer     7 cm cerebral right atypical meningoma grade II    Medications:  Prescriptions prior to admission  Medication Sig Dispense Refill Last Dose  . acetaminophen-codeine (TYLENOL #3) 300-30 MG per tablet  Take 1 tablet by mouth every 6 (six) hours as needed for moderate pain. 90 tablet 0 unk  . aspirin EC 81 MG tablet Take 81 mg by mouth daily.   10/05/2014 at Unknown time  . carvedilol (COREG) 25 MG tablet Take 1 tablet (25 mg total) by mouth 2 (two) times daily with a meal. 180 tablet 4 10/05/2014 at 2200  . diclofenac sodium (VOLTAREN) 1 % GEL Apply 4 g topically 4 (four) times daily. (Patient taking differently: Apply 4 g topically 4 (four) times daily as needed (pain). ) 100 g 1 unk  . FERREX 150 150 MG capsule TAKE 1 CAPSULE BY MOUTH DAILY. 30 capsule 3 10/05/2014 at Unknown time  . FLUoxetine (PROZAC) 10 MG capsule TAKE 1 CAPSULE (10 MG TOTAL) BY MOUTH AT BEDTIME. 30 capsule 1 10/05/2014 at Unknown time  . glucose blood (ACCU-CHEK AVIVA) test strip Use to Check Blood Sugars 3 Times Daily.Dx Code: 250.00. Insulin dependent 100 each 12 10/05/2014 at Unknown time  . insulin aspart (NOVOLOG FLEXPEN) 100 UNIT/ML FlexPen Novolog Insulin: 91 - 160 5 units 161- 230 7 units 231-300 9 units 301- 370 11 units 371 - 440 13 units 441 - 510 15 units 15 mL 11 10/05/2014 at Unknown time  . Insulin Glargine (LANTUS SOLOSTAR) 100 UNIT/ML Solostar Pen Inject 17 Units into the skin at bedtime. 10 mL 2 10/05/2014 at Unknown time  . lamoTRIgine (LAMICTAL) 100 MG tablet Take 1 tablet (100 mg total) by mouth 2 (two) times daily. 60 tablet 11 10/05/2014 at Unknown  time  . levETIRAcetam (KEPPRA) 750 MG tablet Take 1 tablet (750 mg total) by mouth 2 (two) times daily. 60 tablet 11 10/05/2014 at Unknown time  . lisinopril (PRINIVIL,ZESTRIL) 10 MG tablet Take 2 tablets (20 mg total) by mouth daily. 180 tablet 3 10/05/2014 at Unknown time  . omeprazole (PRILOSEC) 20 MG capsule Take 1 capsule (20 mg total) by mouth daily. 30 capsule 3 10/05/2014 at Unknown time  . simvastatin (ZOCOR) 20 MG tablet Take 20 mg by mouth daily.   10/05/2014 at Unknown time   Assessment: AMS 81 y/o F with complex PMH including previous  UTI's presents with AMS s/p fall. UA indicative of UTI. Prior history of pan-sensitive E. Coli and Klebsiella UTI. S/p Rocephin in the ED 8/20 AM at 0245. Now 8/20 PM 1900 rapid response was called for patient not speaking, elevated BP, and respirations. Patient is very hot, follows commands no verbal communication. Temp 102.8, BP 202/70, RR40's.  MD ordered Vanco to cover for possible Enterococcus.   Goal of Therapy:  Vancomycin trough level 10-15 mcg/ml  Plan:  Vancomycin '1500mg'$  IV q24h Vanco trough after 3-5 doses    Charlie Seda S. Alford Highland, PharmD, BCPS Clinical Staff Pharmacist Pager [phone]  Eilene Ghazi Stillinger 10/06/2014,7:51 PM

## 2014-10-06 NOTE — Progress Notes (Signed)
Subjective: [name] was seen and examined this AM.  She is feeling well, thirsty.  Denies fever, back pain or abd pain.   Objective: Vital signs in last 24 hours: Filed Vitals:   10/06/14 0247 10/06/14 0315 10/06/14 0415 10/06/14 1100  BP: 180/62 178/71 148/64 182/74  Pulse: 82 81 80 73  Temp:   99.1 F (37.3 C) 98.6 F (37 C)  TempSrc:   Oral Axillary  Resp: '18 16 17 24  '$ Height:      Weight:   204 lb 8 oz (92.761 kg)   SpO2: 96% 100% 100% 98%   Weight change:   Intake/Output Summary (Last 24 hours) at 10/06/14 1348 Last data filed at 10/06/14 0347  Gross per 24 hour  Intake    375 ml  Output      0 ml  Net    375 ml   General: resting in bed in NAD HEENT: right eye with yellowish discharge, conjunctiva pink, left conjunctive normal Cardiac: RRR, no rubs, murmurs or gallops Pulm: clear to auscultation bilaterally, moving normal volumes of air Abd: soft, nontender, BS present, minimal suprapubic tenderness, no CVAT Ext: warm and well perfused, no pedal edema Neuro: alert and oriented X3, responding appropriately  Lab Results: Basic Metabolic Panel:  Recent Labs Lab 10/06/14 0019 10/06/14 0443  NA 140 140  K 3.3* 3.2*  CL 103 105  CO2 27 28  GLUCOSE 347* 273*  BUN 19 16  CREATININE 1.31* 1.07*  CALCIUM 8.2* 7.9*  MG  --  1.7   Liver Function Tests:  Recent Labs Lab 10/06/14 0019  AST 14*  ALT 10*  ALKPHOS 63  BILITOT 0.6  PROT 6.2*  ALBUMIN 2.9*    Recent Labs Lab 10/06/14 0019  LIPASE 19*   CBC:  Recent Labs Lab 10/06/14 0019 10/06/14 0443  WBC 7.1 7.2  NEUTROABS 5.0  --   HGB 10.2* 9.6*  HCT 33.3* 31.7*  MCV 79.7 79.3  PLT 247 255   CBG:  Recent Labs Lab 10/06/14 0403 10/06/14 0622 10/06/14 1146  GLUCAP 296* 247* 157*   Urinalysis:  Recent Labs Lab 10/06/14 0149  COLORURINE YELLOW  LABSPEC 1.025  PHURINE 5.5  GLUCOSEU >1000*  HGBUR TRACE*  BILIRUBINUR NEGATIVE  KETONESUR 15*  PROTEINUR 30*  UROBILINOGEN 1.0    NITRITE POSITIVE*  LEUKOCYTESUR LARGE*   Studies/Results: Dg Chest 2 View  10/06/2014   CLINICAL DATA:  Status post unwitnessed fall. Concern for chest injury. Initial encounter.  EXAM: CHEST  2 VIEW  COMPARISON:  Chest radiograph performed 07/01/2014  FINDINGS: The lungs are well-aerated. Mild right midlung scarring is again noted. There is no evidence of focal opacification, pleural effusion or pneumothorax.  The heart is mildly enlarged. No acute osseous abnormalities are seen.  IMPRESSION: Mild right mid lung scarring again noted.  Mild cardiomegaly.   Electronically Signed   By: Garald Balding M.D.   On: 10/06/2014 01:09   Ct Head Wo Contrast  10/06/2014   CLINICAL DATA:  Unwitnessed fall.  EXAM: CT HEAD WITHOUT CONTRAST  TECHNIQUE: Contiguous axial images were obtained from the base of the skull through the vertex without intravenous contrast.  COMPARISON:  06/21/2014  FINDINGS: Skull and Sinuses:Status post frontal craniotomy for excision of atypical meningioma. No evidence of fracture.  Mild mucosal edema in the paranasal sinuses.  Orbits: No acute abnormality.  Brain: Encephalomalacia and gliosis in the right more than left frontal lobes has a stable appearance. This is where atypical  meningioma likely invaded the parenchyma. No gross recurrence of the atypical meningioma.  Cerebral volume loss with ventriculomegaly greatest in the frontal regions. Remote perforator infarct affecting the right putamen and corona radiata. Small remote right parietal cortex infarct.  No acute infarct, hemorrhage, hydrocephalus, or shift  IMPRESSION: 1. No evidence of intracranial injury or fracture. 2. Stable ischemic and postsurgical changes.   Electronically Signed   By: Monte Fantasia M.D.   On: 10/06/2014 02:01   Medications: I have reviewed the patient's current medications. Scheduled Meds: . aspirin EC  81 mg Oral Daily  . carvedilol  25 mg Oral BID WC  . cefTRIAXone (ROCEPHIN)  IV  1 g Intravenous QHS   . enoxaparin (LOVENOX) injection  40 mg Subcutaneous Daily  . FLUoxetine  10 mg Oral QHS  . insulin aspart  0-5 Units Subcutaneous QHS  . insulin aspart  0-9 Units Subcutaneous TID WC  . insulin glargine  10 Units Subcutaneous QHS  . lamoTRIgine  100 mg Oral BID  . levETIRAcetam  750 mg Oral BID  . simvastatin  20 mg Oral q1800   Continuous Infusions: . sodium chloride 125 mL/hr at 10/06/14 0625   PRN Meds:. Assessment/Plan: 81 year old woman here with AMS in the setting of UTI.  AMS:  Likely 2/2 UTI.  Seemed resolved this AM. AAO x 3.    UTI:  AMS and supraprubic tendernesss with nitrite and leuk positive UA.   - continue ceftriaxone - await culture and switch to oral (likely tomorrow) if she is still doing well   ?bacterial conjunctivitis:  Right eye red and itchy, + discharge.  She live at home with grandchildren so at risk for bacterial conj. - on ceftr for UTI as above, should cover usual pathogens - reassess in AM, can add drops if no improvement  Hypokalemia:  Got 21mq overnight.  3.2 this AM. - 483m Kdur - repeat BMP tomorrow  DM type 2:  AM CBG 247 but she did not get Lantus last night.  Home dose is 17 units Lantus. - start 10 units Lantus qHS - continue SSI-S  HTN:  Elevated.   - continue Coreg - resume ACEI, Cr at baseline  CKD 3:  Cr at baseline   Seizures:  Continue Keppra and Lamictal  Dispo: Disposition is deferred at this time, awaiting improvement of current medical problems.  Anticipated discharge in approximately 1-2 day(s).   The patient does have a current PCP (JLiberty HandyMD) and [facility].  The patient does not know have transportation limitations that hinder transportation to clinic appointments.  .Services Needed at time of discharge: Y = Yes, Blank = No PT:   OT:   RN:   Equipment:   Other:       AlFrancesca OmanDO 10/06/2014, 1:48 PM

## 2014-10-06 NOTE — H&P (Signed)
Internal Medicine Attending Admission Note  I saw and evaluated the patient. I reviewed the resident's note and I agree with the resident's findings and plan as documented in the resident's note.  Assessment & Plan by Problem:  Active Problems:   UTI (lower urinary tract infection)   Altered mental status  Complicated Urinary tract infection: Patient already recovering well with ceftriaxone and fluid overnight. No signs of pyelonephritis, no signs of renal insufficiency. Given her elderly age and think this should be treated as a complicated urinary tract infection. Urine culture is still pending, but given that is nitrite positive I anticipate this will be another gram-negative organism like Klebsiella or Escherichia coli. Given her age she is at moderate risk for enterococcus, but doing well so far so we'll continue on the current ceftriaxone and await speciation.  Acute Encephalopathy: Most likely due to urosepsis in the setting of complicated urinary tract infection. Already improving with the antibiotic and supportive care overnights. No sign of seizure activity so far, we will continue the [place] for prophylaxis. Patient will need standard delirium precautions.   Chief Complaint(s):  History - key components related to admission:  81 year old woman brought in to the emergency department by EMS from her home yesterday because of worsening mentation. Currently the patient denies having any significant pain. Denies any recent fevers. No flank pain. Eating and drinking well overnight. Says that she has had several urinary tract infections in the past, previously growing Escherichia coli. She did report that she has been having dysuria for about 2 weeks, currently denies any frequency or burning pain. Last fall was about 2 weeks ago and led to an emergency department visit from which she was discharged after evaluation.  Lab results: Reviewed in Epic  Physical Exam - key components related to  admission:  Filed Vitals:   10/06/14 0247 10/06/14 0315 10/06/14 0415 10/06/14 1100  BP: 180/62 178/71 148/64 182/74  Pulse: 82 81 80 73  Temp:   99.1 F (37.3 C) 98.6 F (37 C)  TempSrc:   Oral Axillary  Resp: '18 16 17 24  '$ Height:      Weight:   204 lb 8 oz (92.761 kg)   SpO2: 96% 100% 100% 98%   Gen: Elderly-appearing woman, laying in bed, alert spontaneously, no acute distress ENT: Moist beaks membranes CV: Regular rate and rhythm, no murmurs Lungs: Unlabored, clear to auscultation throughout with no crackles Abdomen: Soft, nontender, nondistended Extremities: Warm and well-perfused, normal joints Neuro: Alert and interactive spontaneously, slow to answer questions, normal strength in the upper and lower extremities

## 2014-10-06 NOTE — Progress Notes (Signed)
Subjective: [name] was evaluated this morning on rounds.  Patient appeared in no acute distress and able to converse.  Patient denies SOB, chest pain, fever/chills, dysuria or flank pain at this time.    Objective: Vital signs in last 24 hours: Filed Vitals:   10/06/14 0247 10/06/14 0315 10/06/14 0415 10/06/14 1100  BP: 180/62 178/71 148/64 182/74  Pulse: 82 81 80 73  Temp:   99.1 F (37.3 C) 98.6 F (37 C)  TempSrc:   Oral Axillary  Resp: '18 16 17 24  '$ Height:      Weight:   92.761 kg (204 lb 8 oz)   SpO2: 96% 100% 100% 98%   Weight change:   Intake/Output Summary (Last 24 hours) at 10/06/14 1242 Last data filed at 10/06/14 7619  Gross per 24 hour  Intake    375 ml  Output      0 ml  Net    375 ml   General: resting in bed HEENT: PERRL, EOMI, no scleral icterus Cardiac: RRR, no rubs, murmurs or gallops Pulm: clear to auscultation bilaterally, moving normal volumes of air Abd: soft, suprapubic tenderness, nondistended, BS present Ext: warm and well perfused, no pedal edema Neuro: alert and oriented X3  Lab Results: Lab Results  Component Value Date   WBC 7.2 10/06/2014   HGB 9.6* 10/06/2014   HCT 31.7* 10/06/2014   MCV 79.3 10/06/2014   PLT 255 10/06/2014   BMP Latest Ref Rng 10/06/2014 10/06/2014 07/30/2014  Glucose 65 - 99 mg/dL 273(H) 347(H) 322(H)  BUN 6 - 20 mg/dL '16 19 18  '$ Creatinine 0.44 - 1.00 mg/dL 1.07(H) 1.31(H) 1.08(H)  Sodium 135 - 145 mmol/L 140 140 138  Potassium 3.5 - 5.1 mmol/L 3.2(L) 3.3(L) 3.9  Chloride 101 - 111 mmol/L 105 103 107  CO2 22 - 32 mmol/L '28 27 26  '$ Calcium 8.9 - 10.3 mg/dL 7.9(L) 8.2(L) 8.1(L)    Micro Results: No results found for this or any previous visit (from the past 240 hour(s)). Studies/Results: Dg Chest 2 View  10/06/2014   CLINICAL DATA:  Status post unwitnessed fall. Concern for chest injury. Initial encounter.  EXAM: CHEST  2 VIEW  COMPARISON:  Chest radiograph performed 07/01/2014  FINDINGS: The lungs are  well-aerated. Mild right midlung scarring is again noted. There is no evidence of focal opacification, pleural effusion or pneumothorax.  The heart is mildly enlarged. No acute osseous abnormalities are seen.  IMPRESSION: Mild right mid lung scarring again noted.  Mild cardiomegaly.   Electronically Signed   By: Garald Balding M.D.   On: 10/06/2014 01:09   Ct Head Wo Contrast  10/06/2014   CLINICAL DATA:  Unwitnessed fall.  EXAM: CT HEAD WITHOUT CONTRAST  TECHNIQUE: Contiguous axial images were obtained from the base of the skull through the vertex without intravenous contrast.  COMPARISON:  06/21/2014  FINDINGS: Skull and Sinuses:Status post frontal craniotomy for excision of atypical meningioma. No evidence of fracture.  Mild mucosal edema in the paranasal sinuses.  Orbits: No acute abnormality.  Brain: Encephalomalacia and gliosis in the right more than left frontal lobes has a stable appearance. This is where atypical meningioma likely invaded the parenchyma. No gross recurrence of the atypical meningioma.  Cerebral volume loss with ventriculomegaly greatest in the frontal regions. Remote perforator infarct affecting the right putamen and corona radiata. Small remote right parietal cortex infarct.  No acute infarct, hemorrhage, hydrocephalus, or shift  IMPRESSION: 1. No evidence of intracranial injury or fracture. 2. Stable  ischemic and postsurgical changes.   Electronically Signed   By: Monte Fantasia M.D.   On: 10/06/2014 02:01   Medications: I have reviewed the patient's current medications. Scheduled Meds: . aspirin EC  81 mg Oral Daily  . carvedilol  25 mg Oral BID WC  . cefTRIAXone (ROCEPHIN)  IV  1 g Intravenous QHS  . enoxaparin (LOVENOX) injection  40 mg Subcutaneous Daily  . FLUoxetine  10 mg Oral QHS  . insulin aspart  0-5 Units Subcutaneous QHS  . insulin aspart  0-9 Units Subcutaneous TID WC  . insulin glargine  10 Units Subcutaneous QHS  . lamoTRIgine  100 mg Oral BID  .  levETIRAcetam  750 mg Oral BID  . simvastatin  20 mg Oral q1800   Continuous Infusions: . sodium chloride 125 mL/hr at 10/06/14 0625   PRN Meds:. Assessment/Plan: Priniciple Problem   UTI (lower urinary tract infection):  Patient presented to the ED with altered mental status.  Chest x-ray showed no acute process or pneumonia.   UA was positive for leukocytes and nitrites.  Urine culture is pending but suspect gram negative bacteria such as E.coli or Klebsiella.  Patient was started on ceftriaxone and NS in ED and has shown improvement in symptoms this morning. - Continue Ceftriazone              Active Problems:   Altered mental status:  Most likely due to UTI.  ED notes state patient had fallen at home.  CT of head showed no intracranial injury or fracture.  This morning patient was A&O x 3.   -  Continue to monitor -  With hx of seizures continue Keppra and Lamictal   This is a Careers information officer Note.  The care of the patient was discussed with Dr. Redmond Pulling and the assessment and plan formulated with their assistance.  Please see their attached note for official documentation of the daily encounter.     Valinda Party, Med Student 10/06/2014, 12:42 PM

## 2014-10-06 NOTE — Progress Notes (Signed)
Pt BP was 182/74 @ 1100. MS Jessica notified, she stated that she was going to add more home meds for her hypertension. Will close monitor pt.

## 2014-10-06 NOTE — Progress Notes (Signed)
Recheck with patient, upon my arrival to patient room, patient was lying on bed. Not alert or follow command when I called her name. Pt was not able to speak and had an respiration rate of 44. Her VS was 101.3 axillary,BP 168/98,HR 99, Sat 98% on RA, RR 45. Called the charge nurse, Rodena Piety to help. Took away pt blanket to help her cooling down. Notified rapid response team and MD. They were on the way to check pt. Will monitor pt closely.

## 2014-10-06 NOTE — Progress Notes (Signed)
This RN reviewing pts orders, needed clarification regarding IVF orders of NS @ 150 d/t pts hx of CHF. MD on call consulted for further verification. Received verbal confirmation from Jule Ser DO that it was okay to administer fluids for short period of time and chose to lower rate to NS @ 125. Orders carried out. Pt resting comfortably, in no signs of distress. Nursing will continue to monitor.

## 2014-10-06 NOTE — Progress Notes (Signed)
IMTS coverage brief progress note  S: Called by nursing: patient spiked fever to 104.  At bedside patient make good eye contact, nods head that she is not in pain.  Otherwise non verbal.  O: T 104.4  Last BP 202/70 pulse 99  General: female in bed does not appear in distress warm to touch. HEENT:  EOMI, no scleral icterus Cardiac: RRR Pulm: CTA Abd: soft, mild wince with suprapubic palpation, nondistended, BS present Skin: warm to touch Neuro: alert, extremities move freely. Does follow commands to move all extremities and grasp hands, no myoclonus, no tremor  A/P 81 year old female with PMH of seizures and meingioma admitted with AMS and found to have a UTI.  - Patient currently treated with ceftriaxone for UTI, will obtain  BCx, expand coverage for enterococcus with IV Vancomycin. - Do not suspect NMS as no muscle stiffness - Do not suspect Seritonin syndrome but will d/c her ssri for now - May keep on Telemetry, vitals otherwise stable. - Bring down temp with PRN tylenol (per rectum) and cooling blanket. - Do not suspect seziure activity, note did review recent Neuro note that patient does have history of decreased attention episdoes.  Will change over her keppra to IV for now as she likely cannot tolerate oral medications currently.  Lucious Groves, DO IMTS PGY3

## 2014-10-06 NOTE — Significant Event (Signed)
Rapid Response Event Note  Overview: Time Called: 1941 Arrival Time: 1845 Event Type: Other (Comment)  Initial Focused Assessment:  Called for patient not speaking, elevated BP, and respirations.  Upon my arrival to patients room, Rn at bedside.  Patient is lying in bed on nasal cannula 2lpm.  Patient is very hot, follows commands no verbal communication.  Nods head no to pain or SOB.  axillary temp 102.8, sat 100%, 202/70, HR 100, RR 40's   Interventions:  Rectal temp 104.4, MD paged and notified, orders received.   Event Summary:  RN to call if needs assistance   at      at          [facility], [place] Rain

## 2014-10-07 ENCOUNTER — Inpatient Hospital Stay (HOSPITAL_COMMUNITY): Payer: Medicare Other

## 2014-10-07 LAB — BASIC METABOLIC PANEL
ANION GAP: 10 (ref 5–15)
BUN: 13 mg/dL (ref 6–20)
CALCIUM: 7.9 mg/dL — AB (ref 8.9–10.3)
CO2: 26 mmol/L (ref 22–32)
Chloride: 107 mmol/L (ref 101–111)
Creatinine, Ser: 0.92 mg/dL (ref 0.44–1.00)
GFR calc Af Amer: >60 mL/min (ref >60)
GFR, EST NON AFRICAN AMERICAN: 58 mL/min — AB (ref >60)
GLUCOSE: 128 mg/dL — AB (ref 65–99)
POTASSIUM: 3.7 mmol/L (ref 3.5–5.1)
Sodium: 143 mmol/L (ref 135–145)

## 2014-10-07 LAB — GLUCOSE, CAPILLARY
GLUCOSE-CAPILLARY: 189 mg/dL — AB (ref 65–99)
GLUCOSE-CAPILLARY: 165 mg/dL — AB (ref 65–99)
GLUCOSE-CAPILLARY: 147 mg/dL — AB (ref 65–99)
Glucose-Capillary: 134 mg/dL — ABNORMAL HIGH (ref 65–99)

## 2014-10-07 LAB — CBC
HEMATOCRIT: 33.9 % — AB (ref 36.0–46.0)
Hemoglobin: 10.4 g/dL — ABNORMAL LOW (ref 12.0–15.0)
MCH: 24.9 pg — AB (ref 26.0–34.0)
MCHC: 30.7 g/dL (ref 30.0–36.0)
MCV: 81.3 fL (ref 78.0–100.0)
PLATELETS: 262 10*3/uL (ref 150–400)
RBC: 4.17 MIL/uL (ref 3.87–5.11)
RDW: 16.5 % — AB (ref 11.5–15.5)
WBC: 14.3 10*3/uL — AB (ref 4.0–10.5)

## 2014-10-07 LAB — URINE CULTURE

## 2014-10-07 MED ORDER — SODIUM CHLORIDE 0.9 % IV SOLN: INTRAVENOUS | Status: AC

## 2014-10-07 MED ORDER — ACETAMINOPHEN 40 MG HALF SUPP: 1000.0000 mg | Freq: Three times a day (TID) | RECTAL | Status: DC | PRN

## 2014-10-07 MED ORDER — LABETALOL HCL 5 MG/ML IV SOLN
10.0000 mg | Freq: Once | INTRAVENOUS | Status: AC
  Administered 2014-10-07: 10 mg via INTRAVENOUS
  Filled 2014-10-07: qty 4

## 2014-10-07 MED ORDER — ACETAMINOPHEN 650 MG RE SUPP
975.0000 mg | Freq: Three times a day (TID) | RECTAL | Status: DC | PRN
  Administered 2014-10-07 – 2014-10-11 (×9): 975 mg via RECTAL
  Filled 2014-10-07 (×9): qty 2

## 2014-10-07 MED ORDER — DOXYCYCLINE HYCLATE 100 MG IV SOLR
100.0000 mg | Freq: Two times a day (BID) | INTRAVENOUS | Status: DC
  Administered 2014-10-07 – 2014-10-08 (×2): 100 mg via INTRAVENOUS
  Filled 2014-10-07 (×3): qty 100

## 2014-10-07 MED ORDER — DEXTROSE 5 % IV SOLN
2.0000 g | Freq: Three times a day (TID) | INTRAVENOUS | Status: DC
  Administered 2014-10-07 – 2014-10-10 (×9): 2 g via INTRAVENOUS
  Filled 2014-10-07 (×10): qty 2

## 2014-10-07 MED ORDER — IOHEXOL 300 MG/ML  SOLN
25.0000 mL | INTRAMUSCULAR | Status: AC
  Administered 2014-10-07: 25 mL via ORAL

## 2014-10-07 NOTE — Progress Notes (Signed)
Paged MD regarding pts BP, see flowsheet.  No new orders received

## 2014-10-07 NOTE — Progress Notes (Signed)
Internal Medicine Attending:   I saw and examined the patient. I reviewed the resident's note and I agree with the resident's findings and plan as documented in the resident's note.  81 year old woman who is admitted with symptomatic urinary tract infection, now with very high fever overnight peaking at 104. On exam this morning she is quiet but responsive. Follows commands appropriately. abdomen is soft, lungs are clear with no sputum production, and don't see any evidence of abscess on her skin. Neck is totally benign with no stiffness, no headache, so I doubt meningitis or encephalitis. Upper and lower extremities with early stiffness, but then loosens with passive motion. I detect a mild clonus in her feet and hands. Fever curve is a little better but she has not defervesced. Leukocytosis is worse today. Originally admitted with ceftriaxone for suspected Escherichia coli infection. Last night's antibiotic broadened to vancomycin to cover potential enterococcus UTI. Given the constellation of very high fever, potentially worsening mental status, some stiffness on her exam and mild myoclonus, it is possible she could have early serotonin syndrome. Fluoxetine was discontinued yesterday. We'll continue supportive care for now, I don't see any indication for benzodiazepine use. Schedule Tylenol and cooling blankets for her fever. Would have low threshold to broaden her antibiotics (I suggest vancomycin, cefepime, and doxycycline) and do CT imaging if she has worsening clinical status. Urine and blood cultures are still pending.

## 2014-10-07 NOTE — Progress Notes (Signed)
ANTIBIOTIC CONSULT NOTE - INITIAL  Pharmacy Consult for [facility] Indication: Fever  No Known Allergies  Patient Measurements: Height: '5\' 4"'$  (162.6 cm) Weight: 204 lb 8 oz (92.761 kg) IBW/kg (Calculated) : 54.7 Adjusted Body Weight:   Vital Signs: Temp: 102.4 F (39.1 C) (08/21 2049) Temp Source: Rectal (08/21 2049) BP: 173/69 mmHg (08/21 2049) Pulse Rate: 91 (08/21 2049) Intake/Output from previous day:   Intake/Output from this shift: Total I/O In: 481.3 [I.V.:158.8; IV Piggyback:322.5] Out: -   Labs:  Recent Labs  10/06/14 0019 10/06/14 0443 10/07/14 0634  WBC 7.1 7.2 14.3*  HGB 10.2* 9.6* 10.4*  PLT 247 255 262  CREATININE 1.31* 1.07* 0.92   Estimated Creatinine Clearance: 54.7 mL/min (by C-G formula based on Cr of 0.92). No results for input(s): VANCOTROUGH, VANCOPEAK, VANCORANDOM, GENTTROUGH, GENTPEAK, GENTRANDOM, TOBRATROUGH, TOBRAPEAK, TOBRARND, AMIKACINPEAK, AMIKACINTROU, AMIKACIN in the last 72 hours.   Microbiology: Recent Results (from the past 720 hour(s))  Urine culture     Status: None   Collection Time: 10/06/14  1:49 AM  Result Value Ref Range Status   Specimen Description URINE, CATHETERIZED  Final   Special Requests NONE  Final   Culture MULTIPLE SPECIES PRESENT, SUGGEST RECOLLECTION  Final   Report Status 10/07/2014 FINAL  Final  Culture, blood (routine x 2)     Status: None (Preliminary result)   Collection Time: 10/06/14  8:00 PM  Result Value Ref Range Status   Specimen Description BLOOD RIGHT ANTECUBITAL  Final   Special Requests BOTTLES DRAWN AEROBIC AND ANAEROBIC 10CC  Final   Culture NO GROWTH < 24 HOURS  Final   Report Status PENDING  Incomplete  Culture, blood (routine x 2)     Status: None (Preliminary result)   Collection Time: 10/06/14  8:10 PM  Result Value Ref Range Status   Specimen Description BLOOD RIGHT HAND  Final   Special Requests 8CC  Final   Culture NO GROWTH < 24 HOURS  Final   Report Status PENDING  Incomplete     Medical History: Past Medical History  Diagnosis Date  . Diabetes mellitus 2007    HgA1C (02/20/2010) = 9.2, HgA1C (03/20/2009) = 12.1  . CVA (cerebrovascular accident) 6962     right embolic stroke, no residual deficits  . Diverticulitis   . CVA (cerebral infarction) 7-yrs ago  . Arthritis   . Dysphagia   . CHF (congestive heart failure)   . DIABETES MELLITUS, TYPE II 12/04/2005  . HYPERLIPIDEMIA 12/04/2005  . HYPERTENSION 12/04/2005  . GERD 12/04/2005  . ARTHRITIS, KNEE 03/24/2006  . Lung mass 07/08/2010  . Meningioma 07/21/2010  . Hemangioma of liver 12/02/2010  . Depression 12/02/2010  . Adenocarcinoma of lung      Right upper lobe adenocarcinoma. s/p right lower lobectomy 12/15/10  . Brain cancer     7 cm cerebral right atypical meningoma grade II    Medications:   Assessment:  ID: Day #2 abx for UTI. WBC increased 14.3, Tmax 104.4, still febrile  Hx of E.coli/K.pneumo UTI in 02/2014 - Pan S   8/20 Urine Cx - pending  8/20 BCx - pending (drawn after abx started)  8/20 UA - Many bacteria, Lg Leuko, Pos Nitrite, TNTC WBC   Fortaz 8/21>>  Ceftriaxone 8/20 >> 8/21 Vancomycin 8/20 >>  Doxy 8/21>>  Goal of Therapy:  addition of antipseudomonal coverage  Plan:  Fortaz 2g IV q8hr    Celestino Ackerman S. Alford Highland, PharmD, [place] Clinical Staff Pharmacist Pager [phone]  Wayland Salinas  10/07/2014,9:27 PM

## 2014-10-07 NOTE — Progress Notes (Signed)
Subjective: Events of yesterday evening noted.  Ms. Haluska spiked a fever of 104F and had decreased verbal responsiveness.  Repeat blood cultures were sent and antibiotic coverage was broadened.    Ms. Zellmer was seen and examined this AM.  She is feeling well, thirsty but not feeling hungry.  Denies fever, back pain or abd pain.     Objective: Vital signs in last 24 hours: Filed Vitals:   10/06/14 2251 10/06/14 2257 10/07/14 0335 10/07/14 0626  BP: 143/77  175/63 190/67  Pulse: 86  86 90  Temp: 100.3 F (37.9 C) 100.7 F (38.2 C) 100.2 F (37.9 C) 101.3 F (38.5 C)  TempSrc: Oral Rectal Oral Oral  Resp: '28  30 25  '$ Height:      Weight:      SpO2: 98%  98% 94%   Weight change:  No intake or output data in the 24 hours ending 10/07/14 0736 General: resting in bed in NAD; drinking orange juice, full breakfast tray at bedside but she only seems interested in drinking HEENT: no discharge from right eye this AM Cardiac: RRR, no rubs, murmurs or gallops Pulm: clear to auscultation bilaterally, moving normal volumes of air, slight increase RR Abd: soft, nontender, BS present, no tenderness, no CVAT MSK:  Normal tone; ?myoclonus lower extremities Ext: warm and well perfused, no pedal edema Neuro: alert and oriented X3, responding appropriately, no tremors Skin:  Intact, no rashes, ulcers  Lab Results: Basic Metabolic Panel:  Recent Labs Lab 10/06/14 0443 10/07/14 0634  NA 140 143  K 3.2* 3.7  CL 105 107  CO2 28 26  GLUCOSE 273* 128*  BUN 16 13  CREATININE 1.07* 0.92  CALCIUM 7.9* 7.9*  MG 1.7  --    CBC:  Recent Labs Lab 10/06/14 0019 10/06/14 0443 10/07/14 0634  WBC 7.1 7.2 14.3*  NEUTROABS 5.0  --   --   HGB 10.2* 9.6* 10.4*  HCT 33.3* 31.7* 33.9*  MCV 79.7 79.3 81.3  PLT 247 255 262   CBG:  Recent Labs Lab 10/06/14 0403 10/06/14 0622 10/06/14 1146 10/06/14 1533 10/06/14 2122 10/07/14 0633  GLUCAP 296* 247* 157* 151* 144* 134*   Medications: I  have reviewed the patient's current medications. Scheduled Meds: . aspirin EC  81 mg Oral Daily  . carvedilol  25 mg Oral BID WC  . cefTRIAXone (ROCEPHIN)  IV  1 g Intravenous QHS  . enoxaparin (LOVENOX) injection  40 mg Subcutaneous Daily  . insulin aspart  0-5 Units Subcutaneous QHS  . insulin aspart  0-9 Units Subcutaneous TID WC  . insulin glargine  10 Units Subcutaneous QHS  . lamoTRIgine  100 mg Oral BID  . levETIRAcetam  750 mg Intravenous Q12H  . lisinopril  20 mg Oral Daily  . simvastatin  20 mg Oral q1800  . vancomycin  1,500 mg Intravenous Q24H   Continuous Infusions: none   PRN Meds:. Assessment/Plan: 81 year old woman here with AMS in the setting of UTI.  Fever and AMS:  Likely 2/2 UTI.  Her mentation is better this AM.  She is AAO x 3.  Her son was present and says she is at baseline but the one difference is that she is weaker than usual.  CXR yesterday AM was w/o sign of infection.  Neck supple, no headache so I doubt meningitis.  No skin breakdown.  No rashes.  UA + and suprapubic tenderness yesterday c/w UTI.   ? Myoclonus on exam (but unclear  if patient is fully relaxed) and SSRI being held. - Tylenol 1g q8h prn for fever, cooling blanket if needed - follow-up urine and blood cultures - renal US to assess for abscess - if spikes again or change in MS, low threshold for CT abd and broaden abx (keep vancomycin, stop ceftriaxone and add cefepime and doxycycline)  UTI:  AMS and supraprubic tendernesss with nitrite and leuk positive UA.   - continue ceftriaxone and vancomycin - await culture and sensitivities    ?bacterial conjunctivitis:  Right eye red and itchy, + discharge.  She live at home with grandchildren so at risk for bacterial conj. - on ceftriaxone and vanc, should cover usual pathogens  Hypokalemia:  Resolved.  3.7. - monitor BMP tomorrow  DM type 2:  AM CBG 134. - continue 10 units Lantus qHS - continue SSI-S  HTN:  Elevated.   - continue Coreg,  ACEI  CKD 3:  Cr at baseline   Seizures:  Continue Keppra and Lamictal  Dispo: Disposition is deferred at this time, awaiting improvement of current medical problems.  Anticipated discharge in approximately 1-2 day(s).   The patient does have a current PCP Liberty Handy, MD) and does need an [facility] hospital follow-up appointment after discharge.  The patient does not know have transportation limitations that hinder transportation to clinic appointments.  .Services Needed at time of discharge: Y = Yes, Blank = No PT:   OT:   RN:   Equipment:   Other:     LOS: 1 day   Francesca Oman, DO 10/07/2014, 7:36 AM

## 2014-10-07 NOTE — Progress Notes (Signed)
Internal Medicine MD made aware that patient's temperature was 103.3, BP was 185/69 and decreased appetite.  PRN Tylenol order given (see MAR), scheduled blood pressure medications also given.  NT to recheck VS.  No new orders at this time, MD indicated that they would come to re-evaluate patient.  Rapid response RN also informed of patient's elevated temperature.  Will inform oncoming RN to monitor patient's VS.  Patient drowsy but oriented to person and place. Will continue to monitor patient.

## 2014-10-07 NOTE — Evaluation (Signed)
Physical Therapy Evaluation Patient Details Name: [name] MRN: [id number] DOB: 05-14-1933 Today's Date: 10/07/2014   History of Present Illness  Pt is a 81 y/o F admitted w/ symptomatic UTI, possible serotonin syndrome.  PT's PMH includes DM, CVA (no residual deficits), diverticulitis, arthritis, dysphagia, DHF, HTN, lung mass, meningioma, depression, brain cancer.  Clinical Impression  Pt admitted with above diagnosis. Pt currently with functional limitations due to the deficits listed below (see PT Problem List). Anticipate that once pt's confusion improves she will show improvements w/ mobility as well.   She lives at home w/ her daughter and grandaugther.  When asked if there is always someone there she replies "almost".  Therefore, recommending SNF until pt is more alert or home situation can be confirmed. Pt will benefit from skilled PT to increase their independence and safety with mobility to allow discharge to the venue listed below.     Follow Up Recommendations SNF;Supervision/Assistance - 24 hour    Equipment Recommendations  None recommended by PT    Recommendations for Other Services       Precautions / Restrictions Precautions Precautions: Fall Restrictions Weight Bearing Restrictions: No      Mobility  Bed Mobility Overal bed mobility: Needs Assistance Bed Mobility: Supine to Sit     Supine to sit: Mod assist     General bed mobility comments: Mod assist to support trunk and use of bed pad to scoot pt to sitting EOB.  Transfers Overall transfer level: Needs assistance Equipment used: Rolling walker (2 wheeled) Transfers: Sit to/from [facility] Sit to Stand: Mod assist Stand pivot transfers: Mod assist       General transfer comment: Mod assist w/ increased time 2/2 slow processing.  Cues for hand placement.  Pt not focused on acitivity of stand pivot and requires multiple VCs and TCs to remain on task (pt looking out  window).  Ambulation/Gait                Stairs            Wheelchair Mobility    Modified Rankin (Stroke Patients Only)       Balance Overall balance assessment: Needs assistance Sitting-balance support: Bilateral upper extremity supported;Feet supported Sitting balance-Leahy Scale: Good     Standing balance support: Bilateral upper extremity supported;During functional activity Standing balance-Leahy Scale: Poor Standing balance comment: RW for support                             Pertinent Vitals/Pain Pain Assessment: No/denies pain    Home Living Family/patient expects to be discharged to:: Skilled nursing facility                 Additional Comments: Pt is a high fall risk 2/2 confusion.  She is responsive but 2/2 confusion requires increased time and multiple attempts to answer history questions.  She lives at home w/ her daughter and grandaugther.  When asked if there is always someone there she replies "almost".  Therefore, recommending SNF until pt is more alert or home situation can be confirmed.  She reports using a RW at all times PTA.  She has steps to enter her one story house (either 3 or 4 steps, not able to recall if railings).      Prior Function Level of Independence: Independent with assistive device(s)         Comments: Per pt she was independent w/ RW  PTA     Hand Dominance        Extremity/Trunk Assessment   Upper Extremity Assessment: Generalized weakness           Lower Extremity Assessment: Generalized weakness         Communication   Communication: No difficulties  Cognition Arousal/Alertness: Awake/alert Behavior During Therapy: Flat affect Overall Cognitive Status: Impaired/Different from baseline Area of Impairment: Attention;Awareness;Problem solving   Current Attention Level: Focused       Awareness: Emergent Problem Solving: Slow processing;Difficulty sequencing;Requires verbal  cues;Requires tactile cues;Decreased initiation      General Comments General comments (skin integrity, edema, etc.): Anticipate that once pt's confusion improves she will show improvements w/ mobility as well.    Exercises        Assessment/Plan    PT Assessment Patient needs continued PT services  PT Diagnosis Difficulty walking;Abnormality of gait;Generalized weakness   PT Problem List Decreased strength;Decreased activity tolerance;Decreased balance;Decreased mobility;Decreased cognition;Decreased knowledge of use of DME;Decreased safety awareness;Decreased knowledge of precautions  PT Treatment Interventions DME instruction;Gait training;Stair training;Functional mobility training;Therapeutic activities;Therapeutic exercise;Balance training;Neuromuscular re-education;Patient/family education;Cognitive remediation   PT Goals (Current goals can be found in the Care Plan section) Acute Rehab PT Goals Patient Stated Goal: none stated PT Goal Formulation: Patient unable to participate in goal setting Time For Goal Achievement: 10/21/14 Potential to Achieve Goals: Fair    Frequency Min 2X/week   Barriers to discharge        Co-evaluation               End of Session Equipment Utilized During Treatment: Gait belt Activity Tolerance: Other (comment) (pt confused and not focused on task at hand) Patient left: in chair;with call bell/phone within reach;with chair alarm set Nurse Communication: Mobility status;Precautions         Time: 1155-1234 (10 minutes spent using bed pan) PT Time Calculation (min) (ACUTE ONLY): 39 min   Charges:   PT Evaluation $Initial PT Evaluation Tier I: 1 Procedure PT Treatments $Therapeutic Activity: 8-22 mins   PT G Codes:       Joslyn Hy PT, DPT [phone] Pager: [phone] 10/07/2014, 3:33 PM

## 2014-10-07 NOTE — Progress Notes (Signed)
Notified charge RN, rapid response RN and MD regarding pts temp still at 102.9 after administration of rectal Tylenol. Pt will now answer orientation questions and follow some commands. Awaiting orders.

## 2014-10-08 ENCOUNTER — Inpatient Hospital Stay (HOSPITAL_COMMUNITY): Payer: Medicare Other

## 2014-10-08 DIAGNOSIS — E1122 Type 2 diabetes mellitus with diabetic chronic kidney disease: Secondary | ICD-10-CM

## 2014-10-08 DIAGNOSIS — Z794 Long term (current) use of insulin: Secondary | ICD-10-CM

## 2014-10-08 DIAGNOSIS — N390 Urinary tract infection, site not specified: Secondary | ICD-10-CM

## 2014-10-08 DIAGNOSIS — G40909 Epilepsy, unspecified, not intractable, without status epilepticus: Secondary | ICD-10-CM

## 2014-10-08 DIAGNOSIS — F329 Major depressive disorder, single episode, unspecified: Secondary | ICD-10-CM

## 2014-10-08 DIAGNOSIS — H109 Unspecified conjunctivitis: Secondary | ICD-10-CM

## 2014-10-08 DIAGNOSIS — R509 Fever, unspecified: Secondary | ICD-10-CM

## 2014-10-08 DIAGNOSIS — I129 Hypertensive chronic kidney disease with stage 1 through stage 4 chronic kidney disease, or unspecified chronic kidney disease: Secondary | ICD-10-CM

## 2014-10-08 DIAGNOSIS — R4182 Altered mental status, unspecified: Secondary | ICD-10-CM

## 2014-10-08 DIAGNOSIS — E876 Hypokalemia: Secondary | ICD-10-CM

## 2014-10-08 DIAGNOSIS — B9689 Other specified bacterial agents as the cause of diseases classified elsewhere: Secondary | ICD-10-CM

## 2014-10-08 DIAGNOSIS — N183 Chronic kidney disease, stage 3 (moderate): Secondary | ICD-10-CM

## 2014-10-08 DIAGNOSIS — A419 Sepsis, unspecified organism: Secondary | ICD-10-CM

## 2014-10-08 LAB — GLUCOSE, CAPILLARY
GLUCOSE-CAPILLARY: 165 mg/dL — AB (ref 65–99)
GLUCOSE-CAPILLARY: 123 mg/dL — AB (ref 65–99)
GLUCOSE-CAPILLARY: 145 mg/dL — AB (ref 65–99)
Glucose-Capillary: 179 mg/dL — ABNORMAL HIGH (ref 65–99)

## 2014-10-08 LAB — HEMOGLOBIN A1C
HEMOGLOBIN A1C: 10.2 % — AB (ref 4.8–5.6)
Mean Plasma Glucose: 246 mg/dL

## 2014-10-08 LAB — CBC WITH DIFFERENTIAL/PLATELET
BASOS ABS: 0 10*3/uL (ref 0.0–0.1)
BASOS PCT: 0 % (ref 0–1)
EOS ABS: 0.2 10*3/uL (ref 0.0–0.7)
Eosinophils Relative: 1 % (ref 0–5)
HCT: 28.8 % — ABNORMAL LOW (ref 36.0–46.0)
HEMOGLOBIN: 8.8 g/dL — AB (ref 12.0–15.0)
LYMPHS PCT: 7 % — AB (ref 12–46)
Lymphs Abs: 1.3 10*3/uL (ref 0.7–4.0)
MCH: 25.6 pg — ABNORMAL LOW (ref 26.0–34.0)
MCHC: 30.6 g/dL (ref 30.0–36.0)
MCV: 83.7 fL (ref 78.0–100.0)
MONOS PCT: 13 % — AB (ref 3–12)
Monocytes Absolute: 2.4 10*3/uL — ABNORMAL HIGH (ref 0.1–1.0)
NEUTROS PCT: 79 % — AB (ref 43–77)
Neutro Abs: 14.5 10*3/uL — ABNORMAL HIGH (ref 1.7–7.7)
PLATELETS: 241 10*3/uL (ref 150–400)
RBC: 3.44 MIL/uL — ABNORMAL LOW (ref 3.87–5.11)
RDW: 16.8 % — ABNORMAL HIGH (ref 11.5–15.5)
WBC: 18.4 10*3/uL — ABNORMAL HIGH (ref 4.0–10.5)

## 2014-10-08 LAB — BASIC METABOLIC PANEL
ANION GAP: 11 (ref 5–15)
BUN: 13 mg/dL (ref 6–20)
CO2: 22 mmol/L (ref 22–32)
Calcium: 7.7 mg/dL — ABNORMAL LOW (ref 8.9–10.3)
Chloride: 108 mmol/L (ref 101–111)
Creatinine, Ser: 1.01 mg/dL — ABNORMAL HIGH (ref 0.44–1.00)
GFR calc Af Amer: 60 mL/min — ABNORMAL LOW (ref >60)
GFR, EST NON AFRICAN AMERICAN: 52 mL/min — AB (ref >60)
Glucose, Bld: 180 mg/dL — ABNORMAL HIGH (ref 65–99)
POTASSIUM: 3.4 mmol/L — AB (ref 3.5–5.1)
SODIUM: 141 mmol/L (ref 135–145)

## 2014-10-08 LAB — MRSA PCR SCREENING: MRSA by PCR: NEGATIVE

## 2014-10-08 MED ORDER — GLUCERNA SHAKE PO LIQD
237.0000 mL | Freq: Three times a day (TID) | ORAL | Status: DC
  Administered 2014-10-08 – 2014-10-11 (×8): 237 mL via ORAL

## 2014-10-08 MED ORDER — SODIUM CHLORIDE 0.9 % IV SOLN
INTRAVENOUS | Status: AC
  Administered 2014-10-08: 06:00:00 via INTRAVENOUS

## 2014-10-08 MED ORDER — IOHEXOL 300 MG/ML  SOLN
100.0000 mL | Freq: Once | INTRAMUSCULAR | Status: AC | PRN
  Administered 2014-10-08: 100 mL via INTRAVENOUS

## 2014-10-08 NOTE — Clinical Social Work Note (Signed)
Clinical Social Work Assessment  Patient Details  Name: [name] MRN: [id number] Date of Birth: 03/26/1933  Date of referral:  10/08/14               Reason for consult:  Discharge Planning, Facility Placement                Permission sought to share information with:  Family Supports Permission granted to share information::     Name::     [name]  Agency::  n/a  Relationship::  Daughter  Contact Information:  [phone]  Housing/Transportation Living arrangements for the past 2 months:  Single Family Home (with daughter, [name]) Source of Information:  Adult Children Patient Interpreter Needed:  None Criminal Activity/Legal Involvement Pertinent to Current Situation/Hospitalization:  No - Comment as needed Significant Relationships:  Adult Children Lives with:  Adult Children (with daughter, [name]) Do you feel safe going back to the place where you live?  Yes Need for family participation in patient care:  Yes (Comment) (Patient's daughter active in patient's care.)  Care giving concerns:  Patient's daughter expressed no concerns at this time.   Social Worker assessment / plan:  CSW received referral for possible SNF placement at time of discharge. CSW spoke with patient's daughter/caregiver, [name], regarding discharge disposition. Per patient's daughter, patient's daughter has been caring for patient for the past 10 years. Patient's daughter understanding of PT recommendation for SNF placement, but currently refusing SNF placement and requesting for patient to discharge home with patient's daughter. CSW updated RNCM regarding change in discharge disposition. MD also notified.  Employment status:  Retired Forensic scientist:  Medicare PT Recommendations:  [place] / Referral to community resources:  [place]  Patient/Family's Response to care:  Patient's daughter understanding and  agreeable to CSW plan of care.  Patient/Family's Understanding of and Emotional Response to Diagnosis, Current Treatment, and Prognosis:  Patient's daughter understanding and agreeable to CSW plan of care.  Emotional Assessment Appearance:  Other (Comment Required (CSW spoke with patient's daughter as patient disoriented to situation and time.) Attitude/Demeanor/Rapport:  Other (CSW spoke with patient's daughter as patient disoriented to situation and time.) Affect (typically observed):  Other (CSW spoke with patient's daughter as patient disoriented to situation and time.) Orientation:  Oriented to Self, Oriented to Place Alcohol / Substance use:  Not Applicable Psych involvement (Current and /or in the community):  No (Comment) (Not appropriate on this admission.)  Discharge Needs  Concerns to be addressed:  No discharge needs identified Readmission within the last 30 days:  No Current discharge risk:  None Barriers to Discharge:  No Barriers Identified   Caroline Sauger, LCSW 10/08/2014, 11:47 AM [phone]

## 2014-10-08 NOTE — Progress Notes (Signed)
Subjective: Ms. Geigle was evaluated this morning.  Patient's son and daughter were in the room.  Both report not seeing a difference in patients appearance since last visit.  Patient had cooling blanket on and sipping on orange juice.  Family reports patient has not had an appetite since last night. Patient was able to answer questions and follow commands.  Patient denies SOB, chest pain, neck pain or rigidity, abdominal pain, muscle pain or peripheral edema.   Objective: Vital signs in last 24 hours: Filed Vitals:   10/07/14 1805 10/07/14 2049 10/08/14 0455 10/08/14 0808  BP: 185/69 173/69 200/83 161/89  Pulse:  91 103 86  Temp:  102.4 F (39.1 C) 101.9 F (38.8 C)   TempSrc:  Rectal Rectal   Resp:  16 20   Height:      Weight:      SpO2:  93% 92%    Weight change:   Intake/Output Summary (Last 24 hours) at 10/08/14 1059 Last data filed at 10/08/14 0730  Gross per 24 hour  Intake 2581.25 ml  Output      0 ml  Net 2581.25 ml   General: resting in bed HEENT: PERRL, EOMI, no scleral icterus Cardiac: RRR, no rubs, murmurs or gallops Pulm: clear to auscultation bilaterally, moving normal volumes of air Abd: soft, nontender, nondistended, BS present Ext: warm and well perfused, no pedal edema Neuro: alert and oriented X 2 (person and place)  Lab Results: Lab Results  Component Value Date   WBC 18.4* 10/08/2014   HGB 8.8* 10/08/2014   HCT 28.8* 10/08/2014   MCV 83.7 10/08/2014   PLT 241 10/08/2014   BMP Latest Ref Rng 10/08/2014 10/07/2014 10/06/2014  Glucose 65 - 99 mg/dL 180(H) 128(H) 273(H)  BUN 6 - 20 mg/dL '13 13 16  '$ Creatinine 0.44 - 1.00 mg/dL 1.01(H) 0.92 1.07(H)  Sodium 135 - 145 mmol/L 141 143 140  Potassium 3.5 - 5.1 mmol/L 3.4(L) 3.7 3.2(L)  Chloride 101 - 111 mmol/L 108 107 105  CO2 22 - 32 mmol/L '22 26 28  '$ Calcium 8.9 - 10.3 mg/dL 7.7(L) 7.9(L) 7.9(L)     Micro Results: Recent Results (from the past 240 hour(s))  Urine culture     Status: None   Collection Time: 10/06/14  1:49 AM  Result Value Ref Range Status   Specimen Description URINE, CATHETERIZED  Final   Special Requests NONE  Final   Culture MULTIPLE SPECIES PRESENT, SUGGEST RECOLLECTION  Final   Report Status 10/07/2014 FINAL  Final  Culture, blood (routine x 2)     Status: None (Preliminary result)   Collection Time: 10/06/14  8:00 PM  Result Value Ref Range Status   Specimen Description BLOOD RIGHT ANTECUBITAL  Final   Special Requests BOTTLES DRAWN AEROBIC AND ANAEROBIC 10CC  Final   Culture NO GROWTH < 24 HOURS  Final   Report Status PENDING  Incomplete  Culture, blood (routine x 2)     Status: None (Preliminary result)   Collection Time: 10/06/14  8:10 PM  Result Value Ref Range Status   Specimen Description BLOOD RIGHT HAND  Final   Special Requests [place]  Final   Culture NO GROWTH < 24 HOURS  Final   Report Status PENDING  Incomplete   Studies/Results: Dg Chest 2 View  10/08/2014   CLINICAL DATA:  Fever.  EXAM: CHEST  2 VIEW  COMPARISON:  10/06/2014  FINDINGS: Shallow inspiration. Mild cardiac enlargement. Normal pulmonary vascularity. Bilateral perihilar and left peripheral interstitial infiltration  could indicate interstitial pneumonia or edema. No definite blunting of costophrenic angles although positioning on the lateral view is limited. No pneumothorax. Calcified and tortuous aorta. Degenerative changes in both shoulders. Postoperative resection of distal left clavicle.  IMPRESSION: Cardiac enlargement. Perihilar and left peripheral interstitial infiltrates may indicate edema or interstitial pneumonia.   Electronically Signed   By: Lucienne Capers M.D.   On: 10/08/2014 02:54   Ct Abdomen Pelvis W Contrast  10/08/2014   CLINICAL DATA:  Fever with altered mental status. Urinary tract infection. History of lung cancer.  EXAM: CT ABDOMEN AND PELVIS WITH CONTRAST  TECHNIQUE: Multidetector CT imaging of the abdomen and pelvis was performed using the standard protocol  following bolus administration of intravenous contrast.  CONTRAST:  143m OMNIPAQUE IOHEXOL 300 MG/ML  SOLN  COMPARISON:  CT 10/07/2013, 01/19/2013  FINDINGS: Small bilateral pleural effusions. There is adjacent compressive atelectasis. Scarring in the periphery of the right lower lobe.  Hemangioma in the right hepatic lobe is unchanged in size measuring 6.2 x 5.3 cm allowing for differences in caliper placement. An adjacent/ separate hemangioma in the inferior right hepatic lobe is unchanged. No new hepatic lesion. The gallbladder is physiologically distended. Spleen is at the upper limits of normal in size measuring 12.7 cm. The adrenal glands are normal. Pancreas is atrophic.  Heterogeneous enhancement of both kidneys, left greater than right. Symmetric renal excretion. No intrarenal or perirenal fluid collection. No hydronephrosis no obstructive uropathy.  Stomach is decompressed. There are no dilated or thickened bowel loops. Diverticulosis throughout the colon without diverticulitis. Multiple small mesenteric lymph nodes in the central mesentery, left greater than right.  Soft tissue mass just inferior to the aortic bifurcation with well-defined margins currently measures 6.3 x 5.6 cm, previously 6.6 x 5.9 cm. Prominent and mildly enlarged upper retroperitoneal lymph nodes, left periaortic lymph node measures 14 mm just inferior to the the left renal vein. Atherosclerosis of normal caliber abdominal aorta and its branches.  Within the pelvis the urinary bladder is physiologically distended with bladder wall thickening. The uterus is surgically absent. There is a small amount of free fluid in the pelvis. Rectal temperature probe is in place. Intramuscular lipoma in the right iliopsoas muscle is unchanged from prior exam.  There are no acute or suspicious osseous abnormalities. Degenerative change in the lower lumbar spine.  IMPRESSION: 1. Heterogeneous enhancement of both kidneys, left greater than right,  concerning for urinary tract infection. No perirenal fluid collection. 2. Multiple prominent and mildly enlarged retroperitoneal lymph nodes, may be reactive. However, given history of lung cancer, metastatic involvement is not excluded. Recommend follow-up CT after symptoms have resolved to evaluate for imaging resolution. Additionally there are multiple prominent mesenteric lymph nodes that are new from prior. 3. Small pleural effusions with adjacent compressive atelectasis. 4. Chronic findings include bladder wall thickening, atherosclerosis, hepatic hemangiomas. Soft tissue mass just below the iliac bifurcation. This mass is stable to slightly decreased in size compared to prior exam.   Electronically Signed   By: MJeb LeveringM.D.   On: 10/08/2014 02:24   [place]  10/08/2014   CLINICAL DATA:  Urinary tract infection.  Fever.  EXAM: RENAL / URINARY TRACT ULTRASOUND COMPLETE  COMPARISON:  CT abdomen and pelvis 10/08/2014  FINDINGS: Right Kidney:  Length: 10.4 cm. Echogenicity within normal limits. No mass or hydronephrosis visualized.  Left Kidney:  Length: 10.1 cm. Echogenicity within normal limits. No mass or hydronephrosis visualized.  Bladder:  Appears normal for degree of bladder  distention.  IMPRESSION: Normal ultrasound appearance of the kidneys and bladder.   Electronically Signed   By: Lucienne Capers M.D.   On: 10/08/2014 03:21   Medications: I have reviewed the patient's current medications. Scheduled Meds: . aspirin EC  81 mg Oral Daily  . carvedilol  25 mg Oral BID WC  . cefTAZidime (FORTAZ)  IV  2 g Intravenous 3 times per day  . doxycycline (VIBRAMYCIN) IV  100 mg Intravenous Q12H  . enoxaparin (LOVENOX) injection  40 mg Subcutaneous Daily  . insulin aspart  0-5 Units Subcutaneous QHS  . insulin aspart  0-9 Units Subcutaneous TID WC  . insulin glargine  10 Units Subcutaneous QHS  . lamoTRIgine  100 mg Oral BID  . levETIRAcetam  750 mg Intravenous Q12H  . lisinopril  20 mg  Oral Daily  . simvastatin  20 mg Oral q1800  . vancomycin  1,500 mg Intravenous Q24H   Continuous Infusions: . sodium chloride 100 mL/hr at 10/08/14 0604   PRN Meds:.acetaminophen Assessment/Plan:   UTI (lower urinary tract infection):  Patient had fevers of 102.4 yesterday and currently 101.9.  And Current WBC 18.4 (previous 14.3)  Patient was started on Ceftriaxone on admission.  With increase in WBC and spiking fevers antibiotics were broadened with Vancomycin (for possible enterococcus UTI), Ceftazidime (covering for pseudomonas) and Doxycycline (tick borne illness/but less likely).  Patient's urine culture showed multiple species present.  Blood Cultures showed no growth at 24 hrs.  CT of Abd and Pelvis showed concern for urinary tract infection, no abscess noted.  Renal US showed normal appearance of kidneys and bladder, no pyelonephritis suspected.  Another thought is Drug fever however, low on the differential.  Looking at the fever curve there were spikes after each ceftriaxone dosage.  Patient has received ceftriaxone in the past without complication so this is less likely to be the culprit.  Can consider getting CBC with diff to look at eosinophils.  Patient may just need 72 hrs of antibiotics to see improvement.   - Repeat Urine Culture - Continue to monitor vitals and CBC with diff in morning - Continue to use cooling blanket with spiking fevers - Continue IV fluids  Seizure disorder:  Continue Keppra and Lamictal  Essential hypertension: Elevated -Continue Coreg and Lisinopril  DM type II:  Current BS 180 - continue 10 units Lantus qHS - continue SSI-S  This is a Careers information officer Note.  The care of the patient was discussed with Dr. Lynnae January and the assessment and plan formulated with their assistance.  Please see their attached note for official documentation of the daily encounter.   LOS: 2 days   Valinda Party, Med Student 10/08/2014, 10:59 AM

## 2014-10-08 NOTE — Progress Notes (Signed)
Initial Nutrition Assessment  DOCUMENTATION CODES:   Obesity unspecified  INTERVENTION:   Provide Glucerna Shake po TID, each supplement provides 220 kcal and 10 grams of protein.  Encourage adequate PO intake.   NUTRITION DIAGNOSIS:   Inadequate oral intake related to poor appetite as evidenced by meal completion < 25%.  GOAL:   Patient will meet greater than or equal to 90% of their needs  MONITOR:   PO intake, Supplement acceptance, Weight trends, Labs, I & O's  REASON FOR ASSESSMENT:   Low Braden    ASSESSMENT:   81 y/o F admitted w/ symptomatic UTI, possible serotonin syndrome. PT's PMH includes DM, CVA (no residual deficits), diverticulitis, arthritis, dysphagia, DHF, HTN, lung mass, meningioma, depression, brain cancer.  Pt was responsive to only some questions asked during time of visit. No family at bedside. Pt reports having a lack of appetite which has only been recent as she reports usually eating fine at home. Meal completion has been 0-25%. No weight loss per Epic weight records. Pt is agreeable to Glucerna Shake to aid in caloric and protein needs. RD to order.   Pt with no observed significant fat or muscle mass loss.   Labs and medications reviewed.  Diet Order:  Diet Carb Modified Fluid consistency:: Thin; Room service appropriate?: Yes  Skin:  Reviewed, no issues  Last BM:  8/22  Height:   Ht Readings from Last 1 Encounters:  10/06/14 '5\' 4"'$  (1.626 m)    Weight:   Wt Readings from Last 1 Encounters:  10/06/14 204 lb 8 oz (92.761 kg)    Ideal Body Weight:  54.54 kg  BMI:  Body mass index is 35.09 kg/(m^2).  Estimated Nutritional Needs:   Kcal:  6286-3817  Protein:  90-105 grams  Fluid:  1.7-1.9 L/day  EDUCATION NEEDS:   No education needs identified at this time  Corrin Parker, MS, RD, LDN Pager # [phone] After hours/ weekend pager # [phone]

## 2014-10-08 NOTE — Progress Notes (Signed)
  Date: 10/08/2014  Patient name: [name]  Medical record number: [id number]  Date of birth: [birth year]   This patient has been seen and the plan of care was discussed with the house staff. Please see their note for complete details. I concur with their findings with the following additions/corrections:   I spoke with her nurses who were concerned about her lack of responsiveness. The pt was not responsive to verbal cues nor to gentle touch. She responded to pain and told me firmly to "stop pressing your nail into be finger."   Filed Vitals:   10/08/14 0808  BP: 161/89  Pulse: 86  Temp:   Resp:   24 hour T max 103.3. Most recent 101.9 Resp rate 20 Gen elderly female, eyes closed. Able to get to respond with pain only and then her response is appropriate. Cooling blanket on. HRRR no MRG L CTAB anteriorly, pt not cooperative to sit up ABD + BS, soft Ext no edema Skin no rash Neuro Responds appropriate to pain. Moving all 4 spontaneously.  Pertinent labs : WBC 14.3 - 18.4 Plts 241 HgB 10.4 - 8.8 K 3.4  A/P 1. Sepsis 2/2 UTI - there was concern for another etiology for her fever since her UTI was being tx. However, there does not appear to be  CNS infx, CXR poor quality but no definite infiltrate and no sxs, ABD benign. CT does show evidence of B UTI and some non specific LAD. ABX were broadened yesterday from rocephin to ceftazidime, doxy, and Vanc and she got one dose last PM. The most likely source of her fever is her UTI and we will cont ABX, narrowing once cx returned. I doubt she has a tick borne illness as plts nl and no rash so doxy might be D/C'd if remains stable in AM.  2. Intermittently depressed mental status - she is fully responsive to pain and just to voice on MS 4 Hoffman's exam. Cont to follow although I doubt there is a medical cause for this  3. Sz d/o - cont her keppra and lamictal  4. HTN - cont home meds and follow.   She will likely require  another 1-2 days broad ABX before her ABX can be narrowed.    Bartholomew Crews, MD 10/08/2014, 11:55 AM

## 2014-10-08 NOTE — Progress Notes (Signed)
Pt in and out cathed per MD order to obtain urine sample for culture. Pt still sluggish- arouses to voice and touch, has delayed verbal responses if any but will nod. Taking pills now after not waking to take them this am. Rectal temp currently 99.6, last BP was 140/56. Pt is more verbal with nursing when family present. Will continue to monitor pt.   [place], Jerry Caras

## 2014-10-09 ENCOUNTER — Ambulatory Visit: Payer: Self-pay | Admitting: *Deleted

## 2014-10-09 LAB — CBC WITH DIFFERENTIAL/PLATELET
BASOS ABS: 0 10*3/uL (ref 0.0–0.1)
Basophils Relative: 0 % (ref 0–1)
EOS ABS: 0.3 10*3/uL (ref 0.0–0.7)
Eosinophils Relative: 2 % (ref 0–5)
HCT: 30 % — ABNORMAL LOW (ref 36.0–46.0)
Hemoglobin: 9.4 g/dL — ABNORMAL LOW (ref 12.0–15.0)
LYMPHS PCT: 11 % — AB (ref 12–46)
Lymphs Abs: 1.7 10*3/uL (ref 0.7–4.0)
MCH: 26.3 pg (ref 26.0–34.0)
MCHC: 31.3 g/dL (ref 30.0–36.0)
MCV: 83.8 fL (ref 78.0–100.0)
Monocytes Absolute: 1.6 10*3/uL — ABNORMAL HIGH (ref 0.1–1.0)
Monocytes Relative: 10 % (ref 3–12)
NEUTROS PCT: 77 % (ref 43–77)
Neutro Abs: 12.3 10*3/uL — ABNORMAL HIGH (ref 1.7–7.7)
PLATELETS: ADEQUATE 10*3/uL (ref 150–400)
RBC: 3.58 MIL/uL — ABNORMAL LOW (ref 3.87–5.11)
RDW: 17.4 % — ABNORMAL HIGH (ref 11.5–15.5)
WBC MORPHOLOGY: INCREASED
WBC: 15.9 10*3/uL — AB (ref 4.0–10.5)

## 2014-10-09 LAB — BASIC METABOLIC PANEL
Anion gap: 10 (ref 5–15)
BUN: 11 mg/dL (ref 6–20)
CO2: 23 mmol/L (ref 22–32)
Calcium: 8.1 mg/dL — ABNORMAL LOW (ref 8.9–10.3)
Chloride: 109 mmol/L (ref 101–111)
Creatinine, Ser: 1.04 mg/dL — ABNORMAL HIGH (ref 0.44–1.00)
GFR calc Af Amer: 58 mL/min — ABNORMAL LOW (ref >60)
GFR, EST NON AFRICAN AMERICAN: 50 mL/min — AB (ref >60)
Glucose, Bld: 124 mg/dL — ABNORMAL HIGH (ref 65–99)
POTASSIUM: 3.4 mmol/L — AB (ref 3.5–5.1)
SODIUM: 142 mmol/L (ref 135–145)

## 2014-10-09 LAB — GLUCOSE, CAPILLARY
GLUCOSE-CAPILLARY: 149 mg/dL — AB (ref 65–99)
GLUCOSE-CAPILLARY: 132 mg/dL — AB (ref 65–99)
Glucose-Capillary: 105 mg/dL — ABNORMAL HIGH (ref 65–99)
Glucose-Capillary: 105 mg/dL — ABNORMAL HIGH (ref 65–99)

## 2014-10-09 LAB — URINE CULTURE: Culture: NO GROWTH

## 2014-10-09 MED ORDER — POTASSIUM CHLORIDE 20 MEQ/15ML (10%) PO SOLN
40.0000 meq | Freq: Once | ORAL | Status: DC
Start: 2014-10-09 — End: 2014-10-10
  Filled 2014-10-09: qty 30

## 2014-10-09 MED ORDER — CETYLPYRIDINIUM CHLORIDE 0.05 % MT LIQD
7.0000 mL | Freq: Two times a day (BID) | OROMUCOSAL | Status: DC
  Administered 2014-10-09: 7 mL via OROMUCOSAL

## 2014-10-09 MED ORDER — POTASSIUM CHLORIDE CRYS ER 20 MEQ PO TBCR
40.0000 meq | EXTENDED_RELEASE_TABLET | Freq: Once | ORAL | Status: DC
  Filled 2014-10-09 (×2): qty 2

## 2014-10-09 MED ORDER — POTASSIUM CHLORIDE 20 MEQ PO PACK: 40.0000 meq | PACK | Freq: Once | ORAL | Status: DC

## 2014-10-09 MED ORDER — CHLORHEXIDINE GLUCONATE 0.12 % MT SOLN
15.0000 mL | Freq: Two times a day (BID) | OROMUCOSAL | Status: DC
  Administered 2014-10-10 – 2014-10-11 (×4): 15 mL via OROMUCOSAL
  Filled 2014-10-09 (×6): qty 15

## 2014-10-09 NOTE — Consult Note (Signed)
   [facility] Inpatient Consult   10/09/2014  BRETTA FEES 19-Feb-1933 [id number]   Patient active with [place] Management services. Please see chart review tab then notes in EPIC for [facility] details. Went to bedside to speak with patient and family. However, patient was resting and family was not at bedside. [facility] Care Management will continue to follow post discharge. Inpatient RNCM aware [facility] is active.   Marthenia Rolling, MSN-Ed, RN,BSN [facility] Liaison [phone]

## 2014-10-09 NOTE — Evaluation (Signed)
Clinical/Bedside Swallow Evaluation Patient Details  Name: [name] MRN: [id number] Date of Birth: [birth year]  Today's Date: 10/09/2014 Time: SLP Start Time (ACUTE ONLY): 1400 SLP Stop Time (ACUTE ONLY): 1445 SLP Time Calculation (min) (ACUTE ONLY): 45 min  Past Medical History:  Past Medical History  Diagnosis Date  . Diabetes mellitus 2007    HgA1C (02/20/2010) = 9.2, HgA1C (03/20/2009) = 12.1  . CVA (cerebrovascular accident) 2006     right embolic stroke, no residual deficits  . Diverticulitis   . CVA (cerebral infarction) 7-yrs ago  . Arthritis   . Dysphagia   . CHF (congestive heart failure)   . DIABETES MELLITUS, TYPE II 12/04/2005  . HYPERLIPIDEMIA 12/04/2005  . HYPERTENSION 12/04/2005  . GERD 12/04/2005  . ARTHRITIS, KNEE 03/24/2006  . Lung mass 07/08/2010  . Meningioma 07/21/2010  . Hemangioma of liver 12/02/2010  . Depression 12/02/2010  . Adenocarcinoma of lung      Right upper lobe adenocarcinoma. s/p right lower lobectomy 12/15/10  . Brain cancer     7 cm cerebral right atypical meningoma grade II   Past Surgical History:  Past Surgical History  Procedure Laterality Date  . Abdominal hysterectomy    . Video bronchoscope.  12/29/2007    [place]  . Wide excision of left upper back mass.    . Extracapsular cataract extraction with intraocular      lens implantation.  . Right vats,right thoracotomy,right lower lobectomy with node dissection    . Incision and drainage perirectal abscess N/A 01/19/2013    Procedure: IRRIGATION AND DEBRIDEMENT PERIRECTAL ABSCESS;  Surgeon: Liz Malady, MD;  Location: [facility] OR;  Service: General;  Laterality: N/A;  . Craniotomy N/A 03/02/2013    Procedure: CRANIOTOMY TUMOR EXCISION;  Surgeon: Carmela Hurt, MD;  Location: [facility];  Service: Neurosurgery;  Laterality: N/A;  Bifrontal Craniotomy for tumor   HPI:  81 year old female admitted 10/05/14 due to fall, AMS. PMH significant for DM, GERD, meningioma with seizures,  UTI.    Assessment / Plan / Recommendation Clinical Impression  Pt had difficulty following directions during this assessment. Overt s/s aspiration noted on thin liquids. Pt apeared to tolerate nectar thick liquids and puree, however, pt was inconsistent in her appropriate management of boluses. She would accept a 2-3 boluses in a row, swallowing in a timely fashion, then would accept the next bolus but hold it orally for up to several minutes. Suction was used at the end of the evaluation to remove oral residue. This is recommended following all po intake. Recommend puree diet with nectar thick liquids VIA TEASPOON. Crush meds, remain upright 30 minutes after po intake. Safe swallow precautions posted at [facility], and reviewed with RN and family. ST to follow for diet tolerance and readiness to advance. It is anticipated that swallow function will return to baseline as UTI clears, as pt issue appears to be more cognitively based.     Aspiration Risk  Moderate    Diet Recommendation Dysphagia 1 (Puree);Nectar   Medication Administration: Crushed with puree Compensations: Minimize environmental distractions;Slow rate;Small sips/bites;Check for pocketing;Follow solids with liquid    Other  Recommendations Oral Care Recommendations: Oral care before and after PO Other Recommendations: Order thickener from pharmacy;Remove water pitcher;Have oral suction available   Follow Up Recommendations       Frequency and Duration min 1 x/week  2 weeks   Pertinent Vitals/Pain Pt indicates no pain.    SLP Swallow Goals  Tolerance of least  restrictive diet   Swallow Study Prior Functional Status    Regular diet/ thin liquids prior to admit.     General Date of Onset: 10/05/14 Other Pertinent Information: 81 year old female admitted 10/05/14 due to fall, AMS. PMH significant for DM, GERD, meningioma with seizures, UTI.  Type of Study: Bedside swallow evaluation Previous Swallow Assessment: BSE 02/25/13,  10/06/13. Regular diet, thin liquids recommended Diet Prior to this Study: Regular;Thin liquids Temperature Spikes Noted: Yes Respiratory Status: Supplemental O2 delivered via (comment) ([place] @ 2L) History of Recent Intubation: No Behavior/Cognition: Alert;Doesn't follow directions;Confused Oral Cavity - Dentition:  (upper dentures, lower edentulous) Self-Feeding Abilities: Total assist Patient Positioning: Upright in bed Baseline Vocal Quality: Normal Volitional Cough: Cognitively unable to elicit Volitional Swallow: Unable to elicit    Oral/Motor/Sensory Function Overall Oral Motor/Sensory Function: Appears within functional limits for tasks assessed Labial ROM: Within Functional Limits Labial Symmetry: Within Functional Limits Labial Strength: Within Functional Limits Lingual ROM: Within Functional Limits Lingual Symmetry: Within Functional Limits Lingual Strength: Within Functional Limits Lingual Sensation: Within Functional Limits Facial ROM: Within Functional Limits Facial Symmetry: Within Functional Limits Facial Strength: Within Functional Limits Facial Sensation: Within Functional Limits Velum: Within Functional Limits Mandible: Within Functional Limits   Ice Chips Ice chips: Within functional limits Presentation: Spoon   Thin Liquid Thin Liquid: Impaired Presentation: Straw Oral Phase Functional Implications: Oral holding Pharyngeal  Phase Impairments: Suspected delayed Swallow;Decreased hyoid-laryngeal movement;Cough - Immediate    Nectar Thick Nectar Thick Liquid: Within functional limits Presentation: Spoon   Honey Thick Honey Thick Liquid: Not tested   Puree Puree: Within functional limits Presentation: Spoon   Solid   GO    Solid: Not tested      Daryan Buell B. Murvin [facility], CCC-SLP [phone]  Leigh Aurora 10/09/2014,3:09 PM

## 2014-10-09 NOTE — Progress Notes (Signed)
  Date: 10/09/2014  Patient name: [name]  Medical record number: [id number]  Date of birth: [birth year]   This patient has been seen and the plan of care was discussed with the house staff. Please see their note for complete details. I concur with their findings with the following additions/corrections: Ms Ivie remains febrile but her fever is downtrending. Her WBC count is also decreasing. Her exam is unchanged - slight L base crackles but that was the dependent areae and are likely insig. She is on braid spectrum ABX and is responding but slowly. Since we have a source and have r/o other sources of infxn, we will not pursue other infectious and non infectious etiologies at this time. She is responding to tx, just slower than desired. She will cont on IV ABX until she is afebrile at which point we will narrow ABX.   Bartholomew Crews, MD 10/09/2014, 1:57 PM

## 2014-10-09 NOTE — Progress Notes (Signed)
Physical Therapy Treatment Patient Details Name: [name] MRN: [id number] DOB: 04/15/1933 Today's Date: 10/09/2014    History of Present Illness Pt is a 81 y/o F admitted w/ symptomatic UTI, possible serotonin syndrome.  PT's PMH includes DM, CVA (no residual deficits), diverticulitis, arthritis, dysphagia, DHF, HTN, lung mass, meningioma, depression, brain cancer.    PT Comments    Continue to recommend SNF upon d/c as pt currently requires +2 assist for all mobility.  Pt unable to stand pivot to the chair this session, requiring increased time to follow commands consistently.  Pt not oriented to situation.  Recommending OT evaluation at this time 2/2 pt's daughter's refusal for SNF upon d/c.  Attained pt's history from daughter this session; however, unfortunately pt's daughter leaving upon PT arrival and was not present during mobility interventions.  Pt is not safe to d/c home at this time from a mobility standpoint.   Follow Up Recommendations  SNF;Supervision/Assistance - 24 hour     Equipment Recommendations  None recommended by PT    Recommendations for Other Services       Precautions / Restrictions Precautions Precautions: Fall Precaution Comments: Pt requires increased timeto follow commands Restrictions Weight Bearing Restrictions: No    Mobility  Bed Mobility Overal bed mobility: +2 for physical assistance;Needs Assistance Bed Mobility: Supine to Sit;Sit to Supine;Rolling Rolling: Max assist;+2 for physical assistance   Supine to sit: Max assist;+2 for physical assistance;HOB elevated Sit to supine: Max assist;+2 for physical assistance   General bed mobility comments: Max assist supporting trunk posteriorly and managing Bil LEs during sit<>stand.  Use of bed pad to scoot pt's hips to sitting EOB.  Pt w/ tendency to lean posteriorly.  Max verbal and tactile cues.  Transfers Overall transfer level: Needs assistance Equipment used: Rolling walker (2  wheeled) Transfers: Sit to/from Stand Sit to Stand: Mod assist;+2 physical assistance;From elevated surface         General transfer comment: Increased time to initiate sit>stand w/ mod assist to power up to standing and to assist pt in maintaining upright posture as she is leaning posteriorly.  Use tactile and verbal cues to attempt to have pt stand pivot to chair but pt does not intiate movement.  When asked what we are trying to do she responds "get to the chair" but still does not initiate movement.    Ambulation/Gait                 Stairs            Wheelchair Mobility    Modified Rankin (Stroke Patients Only)       Balance Overall balance assessment: Needs assistance Sitting-balance support: Bilateral upper extremity supported;Feet supported Sitting balance-Leahy Scale: Poor Sitting balance - Comments: Pt leaning posteriorly despite proper hand placement and cues to maintain upright Postural control: Posterior lean Standing balance support: Bilateral upper extremity supported Standing balance-Leahy Scale: Poor Standing balance comment: Requires support from RW and +2 assist to maintain upright, pt leaning posteriorly                    Cognition Arousal/Alertness: Lethargic Behavior During Therapy: Flat affect Overall Cognitive Status: Impaired/Different from baseline Area of Impairment: Attention;Awareness;Problem solving   Current Attention Level: Divided Memory: Decreased short-term memory;Decreased recall of precautions     Awareness: Emergent Problem Solving: Slow processing;Decreased initiation;Requires verbal cues;Difficulty sequencing;Requires tactile cues General Comments: Pt follows commands inconsistently w/ increased time    Exercises  General Comments General comments (skin integrity, edema, etc.): Pt's family leaving room upon PT's arrival and able to answer home environment questions.  Pt lives at home w/ daugther who is  available 24/7 and two grandaughters avaialble prn.  PTA pt used RW at all times and required 1 person HHA to go up/down steps.  Has care attendant who comes M-F from 12-3 who is there if she needs something but pt prefers for her assist to come from her daughter for bathing.  Per pt's daughter, pt was Ind w/ dressing PTA. With pt's daughter currently refusing SNF, recommending OT eval.      Pertinent Vitals/Pain Pain Assessment: No/denies pain    Home Living                      Prior Function            PT Goals (current goals can now be found in the care plan section) Acute Rehab PT Goals Patient Stated Goal: none stated PT Goal Formulation: Patient unable to participate in goal setting Time For Goal Achievement: 10/21/14 Potential to Achieve Goals: Fair Progress towards PT goals: Not progressing toward goals - comment    Frequency  Min 2X/week    PT Plan Current plan remains appropriate    Co-evaluation             End of Session Equipment Utilized During Treatment: Oxygen Activity Tolerance: Other (comment) (pt confused and not focused on task at hand) Patient left: in bed;with call bell/phone within reach     Time: 1218-1238 PT Time Calculation (min) (ACUTE ONLY): 20 min  Charges:  $Therapeutic Activity: 8-22 mins                    G Codes:      Joslyn Hy PT, DPT [phone] Pager: [phone] 10/09/2014, 1:53 PM

## 2014-10-09 NOTE — Progress Notes (Signed)
Subjective: [name] was seen and examined this AM.  She is selectively responsive to questions but is more talkative than she was this past weekend. She denies pain or chills.  She has OJ at bedside but tells me she wants to drink something that does not taste bitter.    Objective: Vital signs in last 24 hours: Filed Vitals:   10/08/14 1300 10/08/14 1545 10/08/14 2051 10/09/14 0525  BP: 140/56  160/61 167/68  Pulse: 77  86 92  Temp: 99.6 F (37.6 C) 100.9 F (38.3 C) 99.1 F (37.3 C) 100.4 F (38 C)  TempSrc: Rectal Rectal Oral Oral  Resp: '20  18 18  '$ Height:      Weight:      SpO2: 97%  95% 93%   Weight change:   Intake/Output Summary (Last 24 hours) at 10/09/14 0733 Last data filed at 10/09/14 0525  Gross per 24 hour  Intake 1610.83 ml  Output      0 ml  Net 1610.83 ml   General: resting in bed in NAD; half eaten yogurt and small cup of orange juice present HEENT: no discharge from right eye this AM Cardiac: RRR, no rubs, murmurs or gallops Pulm: she is able to roll for lung exam, clear to auscultation bilaterally, moving normal volumes of air Abd: soft, nontender, BS present, no tenderness MSK:  Normal tone, MMS equal and intact upper and lower extremities, sensation grossly intact Ext: warm and well perfused, no pedal edema Neuro: alert and oriented X3, responding appropriately (verbally and non-verbally), no tremors, following commands Skin:  Intact, no rashes, ulcers  Lab Results: Basic Metabolic Panel:  Recent Labs Lab 10/06/14 0443  10/08/14 0600 10/09/14 0752  NA 140  < > 141 142  K 3.2*  < > 3.4* 3.4*  CL 105  < > 108 109  CO2 28  < > 22 23  GLUCOSE 273*  < > 180* 124*  BUN 16  < > 13 11  CREATININE 1.07*  < > 1.01* 1.04*  CALCIUM 7.9*  < > 7.7* 8.1*  MG 1.7  --   --   --   < > = values in this interval not displayed. CBC:  Recent Labs Lab 10/08/14 0600 10/09/14 0752  WBC 18.4* PENDING  NEUTROABS 14.5* PENDING  HGB 8.8* 9.4*  HCT 28.8*  30.0*  MCV 83.7 83.8  PLT 241 PENDING   CBG:  Recent Labs Lab 10/07/14 2052 10/08/14 0638 10/08/14 1137 10/08/14 1553 10/08/14 2208 10/09/14 0647  GLUCAP 147* 165* 179* 123* 145* 105*   Medications: I have reviewed the patient's current medications. Scheduled Meds: . aspirin EC  81 mg Oral Daily  . carvedilol  25 mg Oral BID WC  . cefTAZidime (FORTAZ)  IV  2 g Intravenous 3 times per day  . enoxaparin (LOVENOX) injection  40 mg Subcutaneous Daily  . feeding supplement (GLUCERNA SHAKE)  237 mL Oral TID BM  . insulin aspart  0-5 Units Subcutaneous QHS  . insulin aspart  0-9 Units Subcutaneous TID WC  . insulin glargine  10 Units Subcutaneous QHS  . lamoTRIgine  100 mg Oral BID  . levETIRAcetam  750 mg Intravenous Q12H  . lisinopril  20 mg Oral Daily  . simvastatin  20 mg Oral q1800  . vancomycin  1,500 mg Intravenous Q24H   Continuous Infusions: none   PRN Meds:. Assessment/Plan: 81 year old woman here with AMS in the setting of UTI.  Fever and AMS in  the setting of UTI:  Likely 2/2 to UTI.  Fevers present but dowtrending.  Mental status is improving.   - continue vancomcyin and ceftazidime - trend WBC/fever curve; WBC pending this AM - Tylenol 1g q8h prn for fever, cooling blanket if needed - await culture and sensitivities    Conjunctivitis:  Had right eye redness and purulent discharge on admission.  Looks better.  She lives in a home with children.  ? Bacterial vs viral.  She is on broad spectrum abx as above.  Hypokalemia:  Likely due to decreased po.   - 14mq Kdur - BMP tomorrow  DM type 2:  CBG 105-179 in past 24 hours.  AM CBG 105.  She is not eating much but yogurt was half eaten this AM and drinking sweet drinks. - continue 10 units Lantus qHS - continue SSI-S - CBGs ac/hs  HTN:  Elevated this AM but had not yet gotten anti-HTN med. - continue Coreg, ACEI  CKD 3:  Cr at baseline   Seizures:  Continue Keppra and Lamictal  Dispo: Disposition is  deferred at this time, awaiting improvement of current medical problems.  Anticipated discharge in approximately 1-2 day(s).   The patient does have a current PCP (Liberty Handy MD) and [facility].  The patient does not know have transportation limitations that hinder transportation to clinic appointments.  .Services Needed at time of discharge: Y = Yes, Blank = No PT:   OT:   RN:   Equipment:   Other:     LOS: 3 days   AFrancesca Oman DO 10/09/2014, 7:33 AM

## 2014-10-09 NOTE — Care Management Note (Signed)
Case Management Note  Patient Details  Name: [name] MRN: [id number] Date of Birth: [birth year]  Subjective/Objective:                    Action/Plan:  Case manager is monitoring  patient for possible home health needs. Presently physical therapist recommend SNF, patient's family are not interested. Patient lives with [name].   Expected Discharge Date:                  Expected Discharge Plan:     In-House Referral:     Discharge planning Services     Post Acute Care Choice:    Choice offered to:     DME Arranged:    DME Agency:     HH Arranged:    [place] Agency:     Status of Service:    Inprocess   Medicare Important Message Given:    Date Medicare IM Given:    Medicare IM give by:    Date Additional Medicare IM Given:    Additional Medicare Important Message give by:     If discussed at [place] of Stay Meetings, dates discussed:    Additional Comments:  Ninfa Meeker, RN 10/09/2014, 2:33 PM

## 2014-10-09 NOTE — Progress Notes (Signed)
Subjective: [name] was evaluated this morning.  Daughter was in the room and stated that the patient was complaining of being cold yesterday and this morning.  Patient does not report any pain.  Patient was able to tell me the year, her location and name. She responded well to commands.  She is not eating well per family member.  Nurse reported patient having trouble swallowing dinner yesterday and a swallow study is to be done today.  Patient denies headaches, SOB, chest pain, abdominal pain or peripheral edema.  Objective: Vital signs in last 24 hours: Filed Vitals:   10/08/14 2051 10/09/14 0525 10/09/14 0800 10/09/14 0805  BP: 160/61 167/68    Pulse: 86 92    Temp: 99.1 F (37.3 C) 100.4 F (38 C) 101.5 F (38.6 C)   TempSrc: Oral Oral Rectal   Resp: 18 18    Height:      Weight:      SpO2: 95% 93% 88% 95%   Weight change:   Intake/Output Summary (Last 24 hours) at 10/09/14 1007 Last data filed at 10/09/14 0900  Gross per 24 hour  Intake 1510.83 ml  Output      0 ml  Net 1510.83 ml   General: resting in bed HEENT: PERRL, EOMI, no scleral icterus Cardiac: RRR, no rubs, murmurs or gallops Pulm: clear to auscultation bilaterally, moving normal volumes of air Abd: soft, nontender, nondistended, BS present Ext: warm and well perfused, no pedal edema Neuro: alert and oriented X3  Lab Results: Lab Results  Component Value Date   WBC PENDING 10/09/2014   HGB 9.4* 10/09/2014   HCT 30.0* 10/09/2014   MCV 83.8 10/09/2014   PLT PENDING 10/09/2014   BMP Latest Ref Rng 10/09/2014 10/08/2014 10/07/2014  Glucose 65 - 99 mg/dL 124(H) 180(H) 128(H)  BUN 6 - 20 mg/dL '11 13 13  '$ Creatinine 0.44 - 1.00 mg/dL 1.04(H) 1.01(H) 0.92  Sodium 135 - 145 mmol/L 142 141 143  Potassium 3.5 - 5.1 mmol/L 3.4(L) 3.4(L) 3.7  Chloride 101 - 111 mmol/L 109 108 107  CO2 22 - 32 mmol/L '23 22 26  '$ Calcium 8.9 - 10.3 mg/dL 8.1(L) 7.7(L) 7.9(L)    Micro Results: Recent Results (from the past 240  hour(s))  Urine culture     Status: None   Collection Time: 10/06/14  1:49 AM  Result Value Ref Range Status   Specimen Description URINE, CATHETERIZED  Final   Special Requests NONE  Final   Culture MULTIPLE SPECIES PRESENT, SUGGEST RECOLLECTION  Final   Report Status 10/07/2014 FINAL  Final  Culture, blood (routine x 2)     Status: None (Preliminary result)   Collection Time: 10/06/14  8:00 PM  Result Value Ref Range Status   Specimen Description BLOOD RIGHT ANTECUBITAL  Final   Special Requests BOTTLES DRAWN AEROBIC AND ANAEROBIC 10CC  Final   Culture NO GROWTH 2 DAYS  Final   Report Status PENDING  Incomplete  Culture, blood (routine x 2)     Status: None (Preliminary result)   Collection Time: 10/06/14  8:10 PM  Result Value Ref Range Status   Specimen Description BLOOD RIGHT HAND  Final   Special Requests 8CC  Final   Culture NO GROWTH 2 DAYS  Final   Report Status PENDING  Incomplete  MRSA PCR Screening     Status: None   Collection Time: 10/08/14 10:37 AM  Result Value Ref Range Status   MRSA by PCR NEGATIVE NEGATIVE Final  Comment:        The GeneXpert MRSA Assay (FDA approved for NASAL specimens only), is one component of a comprehensive MRSA colonization surveillance program. It is not intended to diagnose MRSA infection nor to guide or monitor treatment for MRSA infections.    Studies/Results: Dg Chest 2 View  10/08/2014   CLINICAL DATA:  Fever.  EXAM: CHEST  2 VIEW  COMPARISON:  10/06/2014  FINDINGS: Shallow inspiration. Mild cardiac enlargement. Normal pulmonary vascularity. Bilateral perihilar and left peripheral interstitial infiltration could indicate interstitial pneumonia or edema. No definite blunting of costophrenic angles although positioning on the lateral view is limited. No pneumothorax. Calcified and tortuous aorta. Degenerative changes in both shoulders. Postoperative resection of distal left clavicle.  IMPRESSION: Cardiac enlargement. Perihilar  and left peripheral interstitial infiltrates may indicate edema or interstitial pneumonia.   Electronically Signed   By: Lucienne Capers M.D.   On: 10/08/2014 02:54   Ct Abdomen Pelvis W Contrast  10/08/2014   CLINICAL DATA:  Fever with altered mental status. Urinary tract infection. History of lung cancer.  EXAM: CT ABDOMEN AND PELVIS WITH CONTRAST  TECHNIQUE: Multidetector CT imaging of the abdomen and pelvis was performed using the standard protocol following bolus administration of intravenous contrast.  CONTRAST:  153m OMNIPAQUE IOHEXOL 300 MG/ML  SOLN  COMPARISON:  CT 10/07/2013, 01/19/2013  FINDINGS: Small bilateral pleural effusions. There is adjacent compressive atelectasis. Scarring in the periphery of the right lower lobe.  Hemangioma in the right hepatic lobe is unchanged in size measuring 6.2 x 5.3 cm allowing for differences in caliper placement. An adjacent/ separate hemangioma in the inferior right hepatic lobe is unchanged. No new hepatic lesion. The gallbladder is physiologically distended. Spleen is at the upper limits of normal in size measuring 12.7 cm. The adrenal glands are normal. Pancreas is atrophic.  Heterogeneous enhancement of both kidneys, left greater than right. Symmetric renal excretion. No intrarenal or perirenal fluid collection. No hydronephrosis no obstructive uropathy.  Stomach is decompressed. There are no dilated or thickened bowel loops. Diverticulosis throughout the colon without diverticulitis. Multiple small mesenteric lymph nodes in the central mesentery, left greater than right.  Soft tissue mass just inferior to the aortic bifurcation with well-defined margins currently measures 6.3 x 5.6 cm, previously 6.6 x 5.9 cm. Prominent and mildly enlarged upper retroperitoneal lymph nodes, left periaortic lymph node measures 14 mm just inferior to the the left renal vein. Atherosclerosis of normal caliber abdominal aorta and its branches.  Within the pelvis the urinary  bladder is physiologically distended with bladder wall thickening. The uterus is surgically absent. There is a small amount of free fluid in the pelvis. Rectal temperature probe is in place. Intramuscular lipoma in the right iliopsoas muscle is unchanged from prior exam.  There are no acute or suspicious osseous abnormalities. Degenerative change in the lower lumbar spine.  IMPRESSION: 1. Heterogeneous enhancement of both kidneys, left greater than right, concerning for urinary tract infection. No perirenal fluid collection. 2. Multiple prominent and mildly enlarged retroperitoneal lymph nodes, may be reactive. However, given history of lung cancer, metastatic involvement is not excluded. Recommend follow-up CT after symptoms have resolved to evaluate for imaging resolution. Additionally there are multiple prominent mesenteric lymph nodes that are new from prior. 3. Small pleural effusions with adjacent compressive atelectasis. 4. Chronic findings include bladder wall thickening, atherosclerosis, hepatic hemangiomas. Soft tissue mass just below the iliac bifurcation. This mass is stable to slightly decreased in size compared to prior exam.  Electronically Signed   By: Jeb Levering M.D.   On: 10/08/2014 02:24   US Renal  10/08/2014   CLINICAL DATA:  Urinary tract infection.  Fever.  EXAM: RENAL / URINARY TRACT ULTRASOUND COMPLETE  COMPARISON:  CT abdomen and pelvis 10/08/2014  FINDINGS: Right Kidney:  Length: 10.4 cm. Echogenicity within normal limits. No mass or hydronephrosis visualized.  Left Kidney:  Length: 10.1 cm. Echogenicity within normal limits. No mass or hydronephrosis visualized.  Bladder:  Appears normal for degree of bladder distention.  IMPRESSION: Normal ultrasound appearance of the kidneys and bladder.   Electronically Signed   By: Lucienne Capers M.D.   On: 10/08/2014 03:21   Medications: I have reviewed the patient's current medications. Scheduled Meds: . aspirin EC  81 mg Oral Daily    . carvedilol  25 mg Oral BID WC  . cefTAZidime (FORTAZ)  IV  2 g Intravenous 3 times per day  . enoxaparin (LOVENOX) injection  40 mg Subcutaneous Daily  . feeding supplement (GLUCERNA SHAKE)  237 mL Oral TID BM  . insulin aspart  0-5 Units Subcutaneous QHS  . insulin aspart  0-9 Units Subcutaneous TID WC  . insulin glargine  10 Units Subcutaneous QHS  . lamoTRIgine  100 mg Oral BID  . levETIRAcetam  750 mg Intravenous Q12H  . lisinopril  20 mg Oral Daily  . simvastatin  20 mg Oral q1800  . vancomycin  1,500 mg Intravenous Q24H   Continuous Infusions:  PRN Meds:.acetaminophen Assessment/Plan: Active Problems:   UTI (lower urinary tract infection): Patient was afebrile yesterday (99.6-100.4) and currently 101.5 And Current WBC 15.9 (previous 18.4) Patient continues to be given Vanc and Ceftazidime. Doxy was discontinued due to low probability of tick borne illnesses (no headaches or rashes noted).  Repeat urine culture results are pending.  Blood cultures shows no growth after 2 days.   No eosinophils on CBC w/diff so drug fever not likely.   Family and Nurse reports patient pocketing food and not swallowing well.  Patient's fever and WBC has been trending downwards over the past 24hrs.        - Swallow study  - Continue to monitor vitals  - Can discontinue cooling blanket and monitor temperature  Hypokalemia:  On admission patient had K of 3.3 and given Kdur and K increased to 3.7.  Currently K 3.4 - Repleat with 62mq of Kdur PO if patient passes swallow test  Seizure disorder: Continue Keppra and Lamictal  Essential hypertension: Elevated -Continue Coreg and Lisinopril  DM type II: Current BS 124 - continue 10 units Lantus qHS - continue SSI-S  This is a MCareers information officerNote.  The care of the patient was discussed with Dr. WRedmond Pullingand the assessment and plan formulated with their assistance.  Please see their attached note for official documentation of the daily  encounter.   LOS: 3 days   JValinda Party Med Student 10/09/2014, 10:07 AM

## 2014-10-10 ENCOUNTER — Telehealth: Payer: Self-pay | Admitting: Neurology

## 2014-10-10 ENCOUNTER — Inpatient Hospital Stay (HOSPITAL_COMMUNITY): Payer: Medicare Other

## 2014-10-10 LAB — GLUCOSE, CAPILLARY
GLUCOSE-CAPILLARY: 105 mg/dL — AB (ref 65–99)
GLUCOSE-CAPILLARY: 102 mg/dL — AB (ref 65–99)
Glucose-Capillary: 97 mg/dL (ref 65–99)
Glucose-Capillary: 100 mg/dL — ABNORMAL HIGH (ref 65–99)

## 2014-10-10 LAB — CBC WITH DIFFERENTIAL/PLATELET
BASOS ABS: 0 10*3/uL (ref 0.0–0.1)
Basophils Relative: 0 % (ref 0–1)
EOS ABS: 0.2 10*3/uL (ref 0.0–0.7)
Eosinophils Relative: 2 % (ref 0–5)
HCT: 29 % — ABNORMAL LOW (ref 36.0–46.0)
Hemoglobin: 9.3 g/dL — ABNORMAL LOW (ref 12.0–15.0)
Lymphocytes Relative: 7 % — ABNORMAL LOW (ref 12–46)
Lymphs Abs: 0.9 10*3/uL (ref 0.7–4.0)
MCH: 27.4 pg (ref 26.0–34.0)
MCHC: 32.1 g/dL (ref 30.0–36.0)
MCV: 85.5 fL (ref 78.0–100.0)
Monocytes Absolute: 2.1 10*3/uL — ABNORMAL HIGH (ref 0.1–1.0)
Monocytes Relative: 15 % — ABNORMAL HIGH (ref 3–12)
Neutro Abs: 10.2 10*3/uL — ABNORMAL HIGH (ref 1.7–7.7)
Neutrophils Relative %: 76 % (ref 43–77)
PLATELETS: 289 10*3/uL (ref 150–400)
RBC: 3.39 MIL/uL — AB (ref 3.87–5.11)
RDW: 18.2 % — ABNORMAL HIGH (ref 11.5–15.5)
WBC: 13.3 10*3/uL — AB (ref 4.0–10.5)

## 2014-10-10 LAB — SEDIMENTATION RATE: SED RATE: 72 mm/h — AB (ref 0–22)

## 2014-10-10 LAB — FERRITIN: Ferritin: 419 ng/mL — ABNORMAL HIGH (ref 11–307)

## 2014-10-10 LAB — BASIC METABOLIC PANEL
ANION GAP: 10 (ref 5–15)
BUN: 12 mg/dL (ref 6–20)
CO2: 25 mmol/L (ref 22–32)
Calcium: 8.1 mg/dL — ABNORMAL LOW (ref 8.9–10.3)
Chloride: 109 mmol/L (ref 101–111)
Creatinine, Ser: 1.1 mg/dL — ABNORMAL HIGH (ref 0.44–1.00)
GFR calc Af Amer: 54 mL/min — ABNORMAL LOW (ref >60)
GFR, EST NON AFRICAN AMERICAN: 46 mL/min — AB (ref >60)
Glucose, Bld: 91 mg/dL (ref 65–99)
POTASSIUM: 3 mmol/L — AB (ref 3.5–5.1)
Sodium: 144 mmol/L (ref 135–145)

## 2014-10-10 MED ORDER — INSULIN GLARGINE 100 UNIT/ML ~~LOC~~ SOLN
5.0000 [IU] | Freq: Every day | SUBCUTANEOUS | Status: DC
  Administered 2014-10-11: 5 [IU] via SUBCUTANEOUS
  Filled 2014-10-10 (×3): qty 0.05

## 2014-10-10 MED ORDER — LEVOFLOXACIN IN D5W 750 MG/150ML IV SOLN
750.0000 mg | INTRAVENOUS | Status: AC
  Administered 2014-10-11: 750 mg via INTRAVENOUS
  Filled 2014-10-10: qty 150

## 2014-10-10 MED ORDER — DEXTROSE 5 % IV SOLN
2.0000 g | Freq: Two times a day (BID) | INTRAVENOUS | Status: DC
  Filled 2014-10-10: qty 2

## 2014-10-10 MED ORDER — LEVOFLOXACIN 500 MG PO TABS: 750.0000 mg | ORAL_TABLET | ORAL | Status: DC

## 2014-10-10 MED ORDER — STARCH (THICKENING) PO POWD
ORAL | Status: DC | PRN
  Filled 2014-10-10: qty 227

## 2014-10-10 MED ORDER — POTASSIUM CHLORIDE CRYS ER 20 MEQ PO TBCR
40.0000 meq | EXTENDED_RELEASE_TABLET | Freq: Once | ORAL | Status: AC
  Administered 2014-10-10: 40 meq via ORAL
  Filled 2014-10-10: qty 2

## 2014-10-10 MED ORDER — LAMOTRIGINE 100 MG PO TABS
50.0000 mg | ORAL_TABLET | Freq: Two times a day (BID) | ORAL | Status: DC
  Administered 2014-10-10 – 2014-10-11 (×2): 50 mg via ORAL
  Filled 2014-10-10 (×2): qty 1

## 2014-10-10 MED ORDER — POTASSIUM CHLORIDE CRYS ER 20 MEQ PO TBCR
EXTENDED_RELEASE_TABLET | ORAL | Status: AC
  Administered 2014-10-10: 40 meq
  Filled 2014-10-10: qty 1

## 2014-10-10 MED ORDER — POTASSIUM CHLORIDE 20 MEQ/15ML (10%) PO SOLN
40.0000 meq | ORAL | Status: DC
  Filled 2014-10-10 (×2): qty 30

## 2014-10-10 NOTE — Progress Notes (Signed)
INTERNAL MEDICINE TEACHING SERVICE Night Float Progress Note   Subjective:    We were called overnight by the RN for evaluation of [name] as she was not wanting to take her PO meds of Levaquin, Lamictal, and potassium. At time of evaluation, pt had been able to tolerate her Lamictal and potassium.  Levaquin had not yet been given due to having to obtain from pharmacy.  Patient was alert and would respond appropriately to questions by nodding her head, however, did not want to answer any questions verbally   Objective:    BP 181/66 mmHg  Pulse 83  Temp(Src) 99 F (37.2 C) (Axillary)  Resp 16  Ht '5\' 4"'$  (1.626 m)  Wt 204 lb 8 oz (92.761 kg)  BMI 35.09 kg/m2  SpO2 93%   Physical Exam: General: Vital signs reviewed and noted. Well-developed, well-nourished, in no acute distress; alert, only nodding her head to questions, not wanting to speak  Lungs:  Normal respiratory effort. Clear to auscultation BL without crackles or wheezes.  Heart: RRR. S1 and S2 normal without gallop, murmur, or rubs.  Abdomen:  BS normoactive. Soft, Nondistended, non-tender.  No masses or organomegaly.  Extremities: No pretibial edema.     Assessment/ Plan:    Patient not wanting to tolerate her oral dose of Levaquin.  Denying any chest pain or shortness of breath.  Is alert on exam, but declining to verbally respond.  Per nursing, she is more active and alert during the day but tends to be more subdued at night time. -she is getting Levaquin q48h.  Will give her an IV dose this evening and then switch back to PO for next dose.    Jule Ser, DO  10/10/2014, 11:08 PM

## 2014-10-10 NOTE — Progress Notes (Signed)
Speech Language Pathology Treatment: Dysphagia  Patient Details Name: [name] MRN: [id number] DOB: [birth year] Today's Date: 10/10/2014 Time: 8119-1478 SLP Time Calculation (min) (ACUTE ONLY): 37 min  Assessment / Plan / Recommendation Clinical Impression  Skilled treatment session focused on addressing dysphagia goals and family education.  SLP facilitated session by reducing environmental distractions and providing set-up of Dys.1 textures and nectar-thick liquids via teaspoon.  Patient required Max multimodal cues for initiation of and sustained attention to self-feeding. Of note, self-feeding appeared to maximize safety with PO consumption versus being fed due to patient pacing intake as she swallowed. Patient intermittently demonstrated oral holding ~60 seconds which resulted in throat clears suspected to be due to spillage resulting in penetration because timely swallows revealed no overt s/s of aspiration.  Granddaughter present for session and SLP provided teach back instruction on restrictions and preparation of Dys.1 textures and nectar-thick liquids.  As session progressed oral holding increased in frequency and length of time as a result PO  Was stopped.  Recommend small, frequent meals.    Of note, granddaughter with questions regarding bed mobility, mobility and showering at home.  Per chart review patient with decline in last few days per PT note, therefore recommend an OT consult to assist in discharge planning.        HPI Other Pertinent Information: 81 year old female admitted 10/05/14 due to fall, AMS. PMH significant for DM, GERD, meningioma with seizures, UTI.    Pertinent Vitals Pain Assessment: No/denies pain  SLP Plan  Continue with current plan of care    Recommendations Diet recommendations: Dysphagia 1 (puree);Nectar-thick liquid Liquids provided via: Teaspoon Medication Administration: Crushed with puree Supervision: Patient able to self feed;Full  supervision/cueing for compensatory strategies;Trained caregiver to feed patient Compensations: Minimize environmental distractions;Slow rate;Small sips/bites;Check for pocketing;Follow solids with liquid Postural Changes and/or Swallow Maneuvers: Seated upright 90 degrees;Upright 30-60 min after meal              General recommendations: OT consult  Oral Care Recommendations: Oral care before and after PO Follow up Recommendations: 24 hour supervision/assistance;Home health SLP versus Skilled Nursing facility Plan: Continue with current plan of care    GO    Carmelia Roller., CCC-SLP [phone]  [place] 10/10/2014, 10:17 AM

## 2014-10-10 NOTE — Progress Notes (Signed)
Subjective: Ms. Beckett was seen and examined this AM.  She is even more verbal today with quicker responses.  Denies pain.  Family says she is showing a little more interest in eating (drank ensure, etc).  Objective: Vital signs in last 24 hours: Filed Vitals:   10/10/14 0501 10/10/14 0507 10/10/14 0811 10/10/14 1352  BP:  171/58  149/44  Pulse:  92  82  Temp: 102.5 F (39.2 C) 102.9 F (39.4 C) 99.6 F (37.6 C) 100.8 F (38.2 C)  TempSrc: Rectal Oral Oral Rectal  Resp:  17  16  Height:      Weight:      SpO2:  96%  96%   Weight change:   Intake/Output Summary (Last 24 hours) at 10/10/14 1723 Last data filed at 10/10/14 0521  Gross per 24 hour  Intake  707.5 ml  Output      0 ml  Net  707.5 ml   General: resting in bed in NAD HEENT: no discharge from right eye this AM Cardiac: RRR, no rubs, murmurs or gallops Pulm: she is able to roll for lung exam, clear to auscultation bilaterally, moving normal volumes of air Abd: soft, nontender, BS present, no tenderness MSK:  Normal tone, MMS equal and intact upper and lower extremities, sensation grossly intact Ext: warm and well perfused, no pedal edema Neuro: alert and oriented X3, responding appropriately (verbally and non-verbally), no tremors, following commands Skin:  Intact, no rashes, ulcers  Lab Results: Basic Metabolic Panel:  Recent Labs Lab 10/06/14 0443  10/09/14 0752 10/10/14 0455  NA 140  < > 142 144  K 3.2*  < > 3.4* 3.0*  CL 105  < > 109 109  CO2 28  < > 23 25  GLUCOSE 273*  < > 124* 91  BUN 16  < > 11 12  CREATININE 1.07*  < > 1.04* 1.10*  CALCIUM 7.9*  < > 8.1* 8.1*  MG 1.7  --   --   --   < > = values in this interval not displayed. CBC:  Recent Labs Lab 10/09/14 0752 10/10/14 0455  WBC 15.9* 13.3*  NEUTROABS 12.3* 10.2*  HGB 9.4* 9.3*  HCT 30.0* 29.0*  MCV 83.8 85.5  PLT PLATELET CLUMPS NOTED ON SMEAR, COUNT APPEARS ADEQUATE 289   CBG:  Recent Labs Lab 10/09/14 1130 10/09/14 1629  10/09/14 2209 10/10/14 0704 10/10/14 1126 10/10/14 1614  GLUCAP 149* 132* 105* 97 100* 105*   Medications: I have reviewed the patient's current medications. Scheduled Meds: . antiseptic oral rinse  7 mL Mouth Rinse q12n4p  . aspirin EC  81 mg Oral Daily  . carvedilol  25 mg Oral BID WC  . chlorhexidine  15 mL Mouth Rinse BID  . enoxaparin (LOVENOX) injection  40 mg Subcutaneous Daily  . feeding supplement (GLUCERNA SHAKE)  237 mL Oral TID BM  . insulin aspart  0-5 Units Subcutaneous QHS  . insulin aspart  0-9 Units Subcutaneous TID WC  . insulin glargine  10 Units Subcutaneous QHS  . lamoTRIgine  50 mg Oral BID  . levETIRAcetam  750 mg Intravenous Q12H  . levofloxacin  750 mg Oral Q48H  . lisinopril  20 mg Oral Daily  . potassium chloride  40 mEq Oral Q4H  . simvastatin  20 mg Oral q1800   Continuous Infusions: none   PRN Meds: Tylenol Assessment/Plan: 81 year old woman here with AMS in the setting of UTI.  Fever and AMS in  the setting of UTI:  No significant findings on urine and blood cx.  Would have expected fever resolution by now for simple UTI.  WBC downtrending this AM and mental status is improving but still febrile to 102F early this AM.  Suspicious for alternative source of fever besides infection.  Other inflammatory/rheum conditions vs malignancy (hx of lung CA) remain on differential.  Family reports increased lethargy and decreased appetite since Lamictal started 1 month ago for "staring spells."  Fever is a rare but possible ADR of Lamictal. - send ANA, RF, ferritin, ESR, HIV  - start tapering Lamictal off (continue long-time med Keppra) - narrow vancomcyin and ceftazidime to levaquin - trend WBC/fever curve;  - Tylenol 1g q8h prn for fever, cooling blanket if needed - await final cultures   Conjunctivitis:  resolved  Hypokalemia:  Likely due to decreased po.   - 38mq Kdur - BMP tomorrow  DM type 2:  CBG 97-149 in past 24 hours.  AM CBG 97.  She is not  eating much but yogurt was half eaten this AM and drinking sweet drinks. - decrease Lantus from 10 units to 5 units qHS - continue SSI-S - CBGs ac/hs  HTN:  stable - continue Coreg, ACEI  CKD 3:  Cr slightly increased.  Likely pre-renal given decreased po. - she seems to be more interested in eating today - will give fluids if needed - BMP in AM  Seizures:  Continue Keppra; taper lamictal as above  Dispo: Disposition is deferred at this time, awaiting improvement of current medical problems.  Anticipated discharge in approximately 1-2 day(s).   The patient does have a current PCP (Liberty Handy MD) and [facility].  The patient does not know have transportation limitations that hinder transportation to clinic appointments.  .Services Needed at time of discharge: Y = Yes, Blank = No PT:   OT:   RN:   Equipment:   Other:     LOS: 4 days   AFrancesca Oman DO 10/10/2014, 5:23 PM

## 2014-10-10 NOTE — Progress Notes (Signed)
Subjective: Ms. Tarazon was evaluated this morning.  She responded to commands and was verbal but did not open her eyes.  Patient denied SOB, chest pain, abdominal pain, or muscle/joint pain.  [place] daughter was in the room and provided history.  [place] daughter reports patient ate yesterday with speech therapy.  She also states she has seen a decline in patient since the start of her 2nd seizure medication, Lamictal.  She states patient has had a decrease in appetite and energy level.         Objective: Vital signs in last 24 hours: Filed Vitals:   10/09/14 2043 10/10/14 0501 10/10/14 0507 10/10/14 0811  BP: 135/57  171/58   Pulse: 84  92   Temp: 100.5 F (38.1 C) 102.5 F (39.2 C) 102.9 F (39.4 C) 99.6 F (37.6 C)  TempSrc: Oral Rectal Oral Oral  Resp: 17  17   Height:      Weight:      SpO2: 100%  96%    Weight change:   Intake/Output Summary (Last 24 hours) at 10/10/14 1122 Last data filed at 10/10/14 0521  Gross per 24 hour  Intake  707.5 ml  Output      0 ml  Net  707.5 ml   General: resting in bed Cardiac: RRR, no rubs, murmurs or gallops Pulm: clear to auscultation bilaterally, moving normal volumes of air Abd: soft, nontender, nondistended, BS present Ext: warm and well perfused, slight edema in right ankle. Neuro: alert and oriented X3  Lab Results: Lab Results  Component Value Date   WBC 13.3* 10/10/2014   HGB 9.3* 10/10/2014   HCT 29.0* 10/10/2014   MCV 85.5 10/10/2014   PLT 289 10/10/2014   BMP Latest Ref Rng 10/10/2014 10/09/2014 10/08/2014  Glucose 65 - 99 mg/dL 91 124(H) 180(H)  BUN 6 - 20 mg/dL '12 11 13  '$ Creatinine 0.44 - 1.00 mg/dL 1.10(H) 1.04(H) 1.01(H)  Sodium 135 - 145 mmol/L 144 142 141  Potassium 3.5 - 5.1 mmol/L 3.0(L) 3.4(L) 3.4(L)  Chloride 101 - 111 mmol/L 109 109 108  CO2 22 - 32 mmol/L '25 23 22  '$ Calcium 8.9 - 10.3 mg/dL 8.1(L) 8.1(L) 7.7(L)     Micro Results: Recent Results (from the past 240 hour(s))  Urine culture     Status: None    Collection Time: 10/06/14  1:49 AM  Result Value Ref Range Status   Specimen Description URINE, CATHETERIZED  Final   Special Requests NONE  Final   Culture MULTIPLE SPECIES PRESENT, SUGGEST RECOLLECTION  Final   Report Status 10/07/2014 FINAL  Final  Culture, blood (routine x 2)     Status: None (Preliminary result)   Collection Time: 10/06/14  8:00 PM  Result Value Ref Range Status   Specimen Description BLOOD RIGHT ANTECUBITAL  Final   Special Requests BOTTLES DRAWN AEROBIC AND ANAEROBIC 10CC  Final   Culture NO GROWTH 3 DAYS  Final   Report Status PENDING  Incomplete  Culture, blood (routine x 2)     Status: None (Preliminary result)   Collection Time: 10/06/14  8:10 PM  Result Value Ref Range Status   Specimen Description BLOOD RIGHT HAND  Final   Special Requests 8CC  Final   Culture NO GROWTH 3 DAYS  Final   Report Status PENDING  Incomplete  MRSA PCR Screening     Status: None   Collection Time: 10/08/14 10:37 AM  Result Value Ref Range Status   MRSA by PCR NEGATIVE NEGATIVE  Final    Comment:        The GeneXpert MRSA Assay (FDA approved for NASAL specimens only), is one component of a comprehensive MRSA colonization surveillance program. It is not intended to diagnose MRSA infection nor to guide or monitor treatment for MRSA infections.   Culture, Urine     Status: None   Collection Time: 10/08/14  1:52 PM  Result Value Ref Range Status   Specimen Description URINE, RANDOM  Final   Special Requests NONE  Final   Culture NO GROWTH 1 DAY  Final   Report Status 10/09/2014 FINAL  Final   Studies/Results: No results found. Medications: I have reviewed the patient's current medications. Scheduled Meds: . antiseptic oral rinse  7 mL Mouth Rinse q12n4p  . aspirin EC  81 mg Oral Daily  . carvedilol  25 mg Oral BID WC  . cefTAZidime (FORTAZ)  IV  2 g Intravenous 3 times per day  . chlorhexidine  15 mL Mouth Rinse BID  . enoxaparin (LOVENOX) injection  40 mg  Subcutaneous Daily  . feeding supplement (GLUCERNA SHAKE)  237 mL Oral TID BM  . insulin aspart  0-5 Units Subcutaneous QHS  . insulin aspart  0-9 Units Subcutaneous TID WC  . insulin glargine  10 Units Subcutaneous QHS  . lamoTRIgine  50 mg Oral BID  . levETIRAcetam  750 mg Intravenous Q12H  . lisinopril  20 mg Oral Daily  . potassium chloride  40 mEq Oral Once  . simvastatin  20 mg Oral q1800  . vancomycin  1,500 mg Intravenous Q24H   Continuous Infusions:  PRN Meds:.acetaminophen, food thickener Assessment/Plan: Fever: WBC has been trending downwards and currently 13.3. Patient continues to have fevers yesterday and today (102.9 - 99.6) Repeat urine culture results showed no growth. After 72hrs of Vanc and Ceftazidime index of suspicion for UTI is low and now looking for other sources for fever. Considering Lamictal as a possible source for fever as it is a common cause of drug fever.  Other sources for fever are inflammatory, other infections and malignancy. - narrowing antibiotics to Levaquin and stopping Vanc and Ceftaz - Getting Ferritin, RF, Sed rate, ANA, HIV antibody - Repeat Chest X-ray - Will pursue possible malignancy source after ruling out inflammatory and other infectious causes.    Hypokalemia: Currently K 3.0 - Repleat with 38mq of Kdur PO   Seizure disorder: Lamictal was added to patient's medications this month.  Grand daughter has noticed a decrease in appetite, energy level and hygiene abilities in the patient starting 4 days after the start of the new drug.  Anti-seizure medications are also known to cause drug fevers.  Will taper patient off Lamictal.  -Continue Keppra and reduce Lamictal from '100mg'$  to '50mg'$   Essential hypertension: mild hypertension  -Continue Coreg and Lisinopril  DM type II: Current BS 100  - continue 10 units Lantus qHS - continue SSI-S    This is a MCareers information officerNote.  The care of the patient was discussed with Dr. WRedmond Pullingand  the assessment and plan formulated with their assistance.  Please see their attached note for official documentation of the daily encounter.   LOS: 4 days   JValinda Party Med Student 10/10/2014, 11:22 AM

## 2014-10-10 NOTE — Care Management Important Message (Signed)
Important Message  Patient Details  Name: [name] MRN: [id number] Date of Birth: 04-Aug-1933   Medicare Important Message Given:  Yes-second notification given    Delorse Lek 10/10/2014, 10:52 AM

## 2014-10-10 NOTE — Progress Notes (Signed)
Rept to Kalman Shan MD regarding pt's approx 11 beat run of SVT at 1555. Pt lying comfortably in bed. Pt eyes closed but asked if she is hurting and she shakes her head "no". Pt no s/sx of distress and no c/o of such. Pt quickly went back to NSR. No new orders at this time. Will continue to monitor.

## 2014-10-10 NOTE — Telephone Encounter (Signed)
Ok to stop lamotrigine  Start oxcarbazepine (trileptal)   150 mg po bid

## 2014-10-10 NOTE — Progress Notes (Signed)
  Date: 10/10/2014  Patient name: [name]  Medical record number: [id number]  Date of birth: [birth year]   This patient has been seen and the plan of care was discussed with the house staff. Please see their note for complete details. I concur with their findings with the following additions/corrections: Temp has not resolved despite adequate tx of presumed cause - UTI. Broaden diff to inflamm and malignancy. W/U pending.  Bartholomew Crews, MD 10/10/2014, 7:39 PM

## 2014-10-10 NOTE — Progress Notes (Addendum)
ANTIBIOTIC CONSULT NOTE - FOLLOW UP  Pharmacy Consult for Vancomycin and Ceftazidime ---> Change to Levaquin Indication: sepsis  No Known Allergies  Patient Measurements: Height: '5\' 4"'$  (162.6 cm) Weight: 204 lb 8 oz (92.761 kg) IBW/kg (Calculated) : 54.7  Vital Signs: Temp: 100.8 F (38.2 C) (08/24 1352) Temp Source: Rectal (08/24 1352) BP: 149/44 mmHg (08/24 1352) Pulse Rate: 82 (08/24 1352) Intake/Output from previous day: 08/23 0701 - 08/24 0700 In: 867.5 [P.O.:60; IV Piggyback:807.5] Out: -  Intake/Output from this shift:    Labs:  Recent Labs  10/08/14 0600 10/09/14 0752 10/10/14 0455  WBC 18.4* 15.9* 13.3*  HGB 8.8* 9.4* 9.3*  PLT 241 PLATELET CLUMPS NOTED ON SMEAR, COUNT APPEARS ADEQUATE 289  CREATININE 1.01* 1.04* 1.10*   Estimated Creatinine Clearance: 45.8 mL/min (by C-G formula based on Cr of 1.1).  Assessment: 81yof continues on day #5 antibiotics for UTI/sepsis. Renal function trending up slightly but overall stable. Cultures remain negative, however, she continues to have fevers to 102.   Hx of E.coli/K.pneumo UTI in 02/2014 - Pan S  8/20 Urine Cx - neg 8/20 BCx - pending (drawn after abx started) 8/20 UA - Many bacteria, Lg Leuko, Pos Nitrite, TNTC WBC 8/22 Urine Cx - neg  Ceftazidime 8/21>> Ceftriaxone 8/20 x 1 Vancomycin 8/20 >> Doxy 8/21 >> 8/22  Goal of Therapy:  Vancomycin trough level 15-20 mcg/ml  Plan:  1) Continue vancomycin '1500mg'$  IV q24 - check trough tonight prior to 2100 dose given continued fevers 2) Change ceftazidime to 2g IV q12 for CrCl < 67m/min  MDeboraha Sprang8/24/2016,1:59 PM   Addendum: Pharmacy now asked to de-escalate antibiotics to levaquin.  Plan: 1) Levaquin '750mg'$  PO q48  MDeboraha Sprang8/24/2016, 2:38 PM

## 2014-10-10 NOTE — Telephone Encounter (Signed)
[name] with [facility] called inquiring about lamoTRIgine (LAMICTAL) tablet 50 mg . Wondering if it has caused a drug induced fever and inquiring if she should be tapered off. Please call and advise. He can be reached at [phone].

## 2014-10-10 NOTE — Telephone Encounter (Signed)
I have spoken with Doren Custard.  This is actually a call from [facility] pharmacist/hospitalist, not a retail pharmacy.  He sts. pt. is currently hospitalized for uti/fever.  Sts. the uti has responded to tx. but fever remains--holding at 101-102.  They think possibly it is caused by the Lamictal and would like to taper or stop it to see if fever resolves.  Pt. was seen once by [facility], for episodes of altered awareness.  Sz. is a diff dx., not certain, and Lamictal was started as a precaution.  Will ask Dr. Felecia Shelling to call Doren Custard to discuss./fim

## 2014-10-11 LAB — GLUCOSE, CAPILLARY
GLUCOSE-CAPILLARY: 105 mg/dL — AB (ref 65–99)
Glucose-Capillary: 103 mg/dL — ABNORMAL HIGH (ref 65–99)
Glucose-Capillary: 108 mg/dL — ABNORMAL HIGH (ref 65–99)
Glucose-Capillary: 110 mg/dL — ABNORMAL HIGH (ref 65–99)

## 2014-10-11 LAB — CULTURE, BLOOD (ROUTINE X 2)
CULTURE: NO GROWTH
CULTURE: NO GROWTH

## 2014-10-11 LAB — CBC WITH DIFFERENTIAL/PLATELET
Basophils Absolute: 0 10*3/uL (ref 0.0–0.1)
Basophils Relative: 0 % (ref 0–1)
Eosinophils Absolute: 0.3 10*3/uL (ref 0.0–0.7)
Eosinophils Relative: 2 % (ref 0–5)
HCT: 26.1 % — ABNORMAL LOW (ref 36.0–46.0)
HEMOGLOBIN: 8.1 g/dL — AB (ref 12.0–15.0)
LYMPHS PCT: 12 % (ref 12–46)
Lymphs Abs: 1.7 10*3/uL (ref 0.7–4.0)
MCH: 26.1 pg (ref 26.0–34.0)
MCHC: 31 g/dL (ref 30.0–36.0)
MCV: 84.2 fL (ref 78.0–100.0)
MONO ABS: 2.1 10*3/uL — AB (ref 0.1–1.0)
Monocytes Relative: 15 % — ABNORMAL HIGH (ref 3–12)
NEUTROS ABS: 9.7 10*3/uL — AB (ref 1.7–7.7)
Neutrophils Relative %: 71 % (ref 43–77)
Platelets: 297 10*3/uL (ref 150–400)
RBC: 3.1 MIL/uL — ABNORMAL LOW (ref 3.87–5.11)
RDW: 17.8 % — AB (ref 11.5–15.5)
WBC: 13.7 10*3/uL — ABNORMAL HIGH (ref 4.0–10.5)

## 2014-10-11 LAB — BASIC METABOLIC PANEL
Anion gap: 8 (ref 5–15)
BUN: 12 mg/dL (ref 6–20)
CHLORIDE: 110 mmol/L (ref 101–111)
CO2: 25 mmol/L (ref 22–32)
Calcium: 8 mg/dL — ABNORMAL LOW (ref 8.9–10.3)
Creatinine, Ser: 1 mg/dL (ref 0.44–1.00)
GFR calc Af Amer: >60 mL/min (ref >60)
GFR calc non Af Amer: 52 mL/min — ABNORMAL LOW (ref >60)
GLUCOSE: 107 mg/dL — AB (ref 65–99)
POTASSIUM: 3.4 mmol/L — AB (ref 3.5–5.1)
Sodium: 143 mmol/L (ref 135–145)

## 2014-10-11 LAB — MAGNESIUM: Magnesium: 1.9 mg/dL (ref 1.7–2.4)

## 2014-10-11 LAB — ANTINUCLEAR ANTIBODIES, IFA: ANA Ab, IFA: NEGATIVE

## 2014-10-11 LAB — HIV ANTIBODY (ROUTINE TESTING W REFLEX): HIV SCREEN 4TH GENERATION: NONREACTIVE

## 2014-10-11 LAB — RHEUMATOID FACTOR: Rhuematoid fact SerPl-aCnc: 11.8 IU/mL (ref 0.0–13.9)

## 2014-10-11 MED ORDER — LISINOPRIL 20 MG PO TABS
20.0000 mg | ORAL_TABLET | Freq: Once | ORAL | Status: AC
  Administered 2014-10-11: 20 mg via ORAL
  Filled 2014-10-11: qty 1

## 2014-10-11 MED ORDER — POTASSIUM CHLORIDE CRYS ER 20 MEQ PO TBCR
40.0000 meq | EXTENDED_RELEASE_TABLET | Freq: Two times a day (BID) | ORAL | Status: AC
  Administered 2014-10-11 (×2): 40 meq via ORAL
  Filled 2014-10-11 (×2): qty 2

## 2014-10-11 MED ORDER — POTASSIUM CHLORIDE CRYS ER 20 MEQ PO TBCR: 40.0000 meq | EXTENDED_RELEASE_TABLET | Freq: Two times a day (BID) | ORAL | Status: DC

## 2014-10-11 MED ORDER — LEVOFLOXACIN 500 MG PO TABS
250.0000 mg | ORAL_TABLET | Freq: Every day | ORAL | Status: DC
  Filled 2014-10-11: qty 1

## 2014-10-11 MED ORDER — LEVETIRACETAM 750 MG PO TABS
750.0000 mg | ORAL_TABLET | Freq: Two times a day (BID) | ORAL | Status: DC
  Filled 2014-10-11: qty 1

## 2014-10-11 MED ORDER — LISINOPRIL 40 MG PO TABS
40.0000 mg | ORAL_TABLET | Freq: Every day | ORAL | Status: DC
  Administered 2014-10-12: 40 mg via ORAL
  Filled 2014-10-11: qty 1

## 2014-10-11 MED ORDER — ACETAMINOPHEN 650 MG RE SUPP: 650.0000 mg | Freq: Three times a day (TID) | RECTAL | Status: DC | PRN

## 2014-10-11 NOTE — Progress Notes (Signed)
Subjective: Ms. Freeberg was seen and examined this AM.  She was sleeping but awoke for [place].  Denies complaint, pain or dyspnea.    Objective: Vital signs in last 24 hours: Filed Vitals:   10/10/14 1352 10/10/14 2210 10/11/14 0557 10/11/14 1100  BP: 149/44 181/66 188/64 181/70  Pulse: 82 83 88 81  Temp: 100.8 F (38.2 C) 99 F (37.2 C) 99.6 F (37.6 C) 98.9 F (37.2 C)  TempSrc: Rectal Axillary  Oral  Resp: 16  16   Height:      Weight:      SpO2: 96% 93% 91%    Weight change:   Intake/Output Summary (Last 24 hours) at 10/11/14 1226 Last data filed at 10/11/14 0020  Gross per 24 hour  Intake  677.5 ml  Output      0 ml  Net  677.5 ml   General: resting in bed in NAD HEENT: [place]/AT Cardiac: RRR, no rubs, murmurs or gallops Pulm: clear to auscultation bilaterally, moving normal volumes of air Abd: soft, nontender, BS present, no tenderness Ext: warm and well perfused, no pedal edema Neuro: alert and oriented X3, responding appropriately (verbally and non-verbally), no tremors, following commands, MMS equal and intact upper and lower extremities, sensation grossly intact, rolls with assistance Skin:  Intact, no rashes, ulcers  Lab Results: Basic Metabolic Panel:  Recent Labs Lab 10/06/14 0443  10/10/14 0455 10/11/14 0519  NA 140  < > 144 143  K 3.2*  < > 3.0* 3.4*  CL 105  < > 109 110  CO2 28  < > 25 25  GLUCOSE 273*  < > 91 107*  BUN 16  < > 12 12  CREATININE 1.07*  < > 1.10* 1.00  CALCIUM 7.9*  < > 8.1* 8.0*  MG 1.7  --   --  1.9  < > = values in this interval not displayed. CBC:  Recent Labs Lab 10/10/14 0455 10/11/14 0519  WBC 13.3* 13.7*  NEUTROABS 10.2* 9.7*  HGB 9.3* 8.1*  HCT 29.0* 26.1*  MCV 85.5 84.2  PLT 289 297   CBG:  Recent Labs Lab 10/10/14 0704 10/10/14 1126 10/10/14 1614 10/10/14 2144 10/11/14 0642 10/11/14 1213  GLUCAP 97 100* 105* 102* 103* 108*   Medications: I have reviewed the patient's current medications. Scheduled  Meds: . antiseptic oral rinse  7 mL Mouth Rinse q12n4p  . aspirin EC  81 mg Oral Daily  . carvedilol  25 mg Oral BID WC  . chlorhexidine  15 mL Mouth Rinse BID  . enoxaparin (LOVENOX) injection  40 mg Subcutaneous Daily  . feeding supplement (GLUCERNA SHAKE)  237 mL Oral TID BM  . insulin aspart  0-5 Units Subcutaneous QHS  . insulin aspart  0-9 Units Subcutaneous TID WC  . insulin glargine  5 Units Subcutaneous QHS  . lamoTRIgine  50 mg Oral BID  . levETIRAcetam  750 mg Intravenous Q12H  . [START ON 10/12/2014] levofloxacin  750 mg Oral Q48H  . lisinopril  20 mg Oral Daily  . potassium chloride  40 mEq Oral BID  . simvastatin  20 mg Oral q1800   Continuous Infusions: none   PRN Meds: Tylenol  Assessment/Plan: 81 year old woman here with AMS in the setting of UTI.  Fever and AMS in the setting of UTI:  Tmax 100.8 in past 24 hours.  Afebrile with stable and more alert/interactive each day.  Suspicious for alternative source of fever besides infection.  Other inflammatory/rheum  conditions vs malignancy (hx of lung CA) remain on differential.  RF and HIV neg.  Ferriting and ESR elevated, but these are non-specific and would be high in infx, other inflam and malignancy.  Fever is a rare but possible ADR of Lamictal. - awaiting ANA result - taper Lamictal off (last dose this AM) - continue levaquin - trend WBC/fever curve - decrease Tylenol from 1g to 662m q8h prn; can reapply cooling blanket if needed  Interstitial edema vs pneumonitis on CXR:  Despite CXR findings she is clinically euvolemic, no crackles or edema on exam.  SpO2 94% on RA when I checked this AM.  If she had a pneumonia, the antibiotic she has been on will cover most likely pathogens and CXR may lag behind resolution.  No indication for diuresis at this time but will continue to clinically monitor. - add IS - continue to work with PT - oxygen supplement as needed   Conjunctivitis:  resolved  Hypokalemia:  Likely due  to decreased po.   - 443m Kdur BID today - BMP tomorrow  DM type 2:  CBG 100-108 in past 24 hours.  AM CBG 103.   - continue Lantus 5 units qHS - continue SSI-S - CBGs ac/hs  HTN:  Elevated  - continue Coreg - increase lisinopril from 79mo 81m30mr improving)  CKD 3:  Cr back to baseline.  Likely pre-renal given decreased po. - encourage po - BMP in AM  Seizures:  Continue Keppra; stopping Lamictal as above  Dispo: Disposition is deferred at this time, awaiting improvement of current medical problems.  Anticipated discharge in approximately 1-2 day(s).   The patient does have a current PCP ([name]) and [facility].  The patient does not know have transportation limitations that hinder transportation to clinic appointments.  .Services Needed at time of discharge: Y = Yes, Blank = No PT:   OT:   RN:   Equipment:   Other:     LOS: 5 days   [name] 10/11/2014, 12:26 PM

## 2014-10-11 NOTE — Progress Notes (Signed)
Pt blood pressure was elevated to 200/71 at 1800. Paged MD about high blood pressure. MD was made aware and gave instruction to take a manual pressure and notify him if it is still elevated.Will continue to monitor.

## 2014-10-11 NOTE — Progress Notes (Signed)
ANTIBIOTIC CONSULT NOTE - FOLLOW UP  Pharmacy Consult for levquin Indication: UTI  No Known Allergies  Patient Measurements: Height: '5\' 4"'$  (162.6 cm) Weight: 204 lb 8 oz (92.761 kg) IBW/kg (Calculated) : 54.7  Vital Signs: Temp: 98.9 F (37.2 C) (08/25 1100) Temp Source: Oral (08/25 1100) BP: 181/70 mmHg (08/25 1100) Pulse Rate: 81 (08/25 1100) Intake/Output from previous day: 08/24 0701 - 08/25 0700 In: 905 [P.O.:290; I.V.:200; IV Piggyback:415] Out: -  Intake/Output from this shift:    Labs:  Recent Labs  10/09/14 0752 10/10/14 0455 10/11/14 0519  WBC 15.9* 13.3* 13.7*  HGB 9.4* 9.3* 8.1*  PLT PLATELET CLUMPS NOTED ON SMEAR, COUNT APPEARS ADEQUATE 289 297  CREATININE 1.04* 1.10* 1.00   Estimated Creatinine Clearance: 50.3 mL/min (by C-G formula based on Cr of 1). No results for input(s): VANCOTROUGH, VANCOPEAK, VANCORANDOM, GENTTROUGH, GENTPEAK, GENTRANDOM, TOBRATROUGH, TOBRAPEAK, TOBRARND, AMIKACINPEAK, AMIKACINTROU, AMIKACIN in the last 72 hours.   Microbiology: Recent Results (from the past 720 hour(s))  Urine culture     Status: None   Collection Time: 10/06/14  1:49 AM  Result Value Ref Range Status   Specimen Description URINE, CATHETERIZED  Final   Special Requests NONE  Final   Culture MULTIPLE SPECIES PRESENT, SUGGEST RECOLLECTION  Final   Report Status 10/07/2014 FINAL  Final  Culture, blood (routine x 2)     Status: None   Collection Time: 10/06/14  8:00 PM  Result Value Ref Range Status   Specimen Description BLOOD RIGHT ANTECUBITAL  Final   Special Requests BOTTLES DRAWN AEROBIC AND ANAEROBIC 10CC  Final   Culture NO GROWTH 5 DAYS  Final   Report Status 10/11/2014 FINAL  Final  Culture, blood (routine x 2)     Status: None   Collection Time: 10/06/14  8:10 PM  Result Value Ref Range Status   Specimen Description BLOOD RIGHT HAND  Final   Special Requests [place]  Final   Culture NO GROWTH 5 DAYS  Final   Report Status 10/11/2014 FINAL  Final   MRSA PCR Screening     Status: None   Collection Time: 10/08/14 10:37 AM  Result Value Ref Range Status   MRSA by PCR NEGATIVE NEGATIVE Final    Comment:        The GeneXpert MRSA Assay (FDA approved for NASAL specimens only), is one component of a comprehensive MRSA colonization surveillance program. It is not intended to diagnose MRSA infection nor to guide or monitor treatment for MRSA infections.   Culture, Urine     Status: None   Collection Time: 10/08/14  1:52 PM  Result Value Ref Range Status   Specimen Description URINE, RANDOM  Final   Special Requests NONE  Final   Culture NO GROWTH 1 DAY  Final   Report Status 10/09/2014 FINAL  Final    Assessment: 81 yo F on day # 6 total abx and day # 2 of levquin.  Levaquin to cover for UTI.  Wt 93 kg, creat 1.0, creat cl > 50 ml/min.  AF.  Goal of Therapy:  Eradicate UTI  Plan:  -change levaquin to 250 mg po qhs for UTI coverage -pharmacy will sign off, re-consult if needed  Eudelia Bunch, Pharm.D. [phone] 10/11/2014 3:09 PM

## 2014-10-11 NOTE — Discharge Summary (Signed)
Name: [name] MRN: [id number] DOB: April 01, 1933 81 y.o. PCP: Liberty Handy, MD  Date of Admission: 10/05/2014 11:55 PM Date of Discharge: 10/12/2014 Attending Physician: Bartholomew Crews, MD  Discharge Diagnosis:  Principal Problem:   UTI (lower urinary tract infection) Active Problems:   Diabetes mellitus with mild nonproliferative diabetic retinopathy   Hyperlipemia   Essential hypertension   Depression   Seizure disorder   Altered mental status  Discharge Medications:   Medication List    STOP taking these medications        lamoTRIgine 100 MG tablet  Commonly known as:  LAMICTAL      TAKE these medications        acetaminophen-codeine 300-30 MG per tablet  Commonly known as:  TYLENOL #3  Take 1 tablet by mouth every 6 (six) hours as needed for moderate pain.     aspirin EC 81 MG tablet  Take 81 mg by mouth daily.     carvedilol 25 MG tablet  Commonly known as:  COREG  Take 1 tablet (25 mg total) by mouth 2 (two) times daily with a meal.     diclofenac sodium 1 % Gel  Commonly known as:  VOLTAREN  Apply 4 g topically 4 (four) times daily.     FERREX 150 150 MG capsule  Generic drug:  iron polysaccharides  TAKE 1 CAPSULE BY MOUTH DAILY.     FLUoxetine 10 MG capsule  Commonly known as:  PROZAC  TAKE 1 CAPSULE (10 MG TOTAL) BY MOUTH AT BEDTIME.     glucose blood test strip  Commonly known as:  ACCU-CHEK AVIVA  Use to Check Blood Sugars 3 Times Daily.Dx Code: 250.00. Insulin dependent     insulin aspart 100 UNIT/ML FlexPen  Commonly known as:  NOVOLOG FLEXPEN  Novolog Insulin: 91 - 160 5 units 161- 230 7 units 231-300 9 units 301- 370 11 units 371 - 440 13 units 441 - 510 15 units     Insulin Glargine 100 UNIT/ML Solostar Pen  Commonly known as:  LANTUS SOLOSTAR  Inject 17 Units into the skin at bedtime.     levETIRAcetam 750 MG tablet  Commonly known as:  KEPPRA  Take 1 tablet (750 mg total) by mouth 2 (two) times daily.     lisinopril 40 MG tablet  Commonly known as:  PRINIVIL,ZESTRIL  Take 1 tablet (40 mg total) by mouth daily.     omeprazole 20 MG capsule  Commonly known as:  PRILOSEC  Take 1 capsule (20 mg total) by mouth daily.     simvastatin 20 MG tablet  Commonly known as:  ZOCOR  Take 20 mg by mouth daily.        Disposition and follow-up:   Ms.Sarah W Wolfert was discharged from [facility] in Good condition.  At the hospital follow up visit please address:  1.  Consider adding Amlodipine '5mg'$  if BP continues to not be well controlled.  And with hx of CAD and stroke consider increasing to a high intensity statin.  2.  Labs / imaging needed at time of follow-up: CBC and BMP   3.  Pending labs/ test needing follow-up: None  Follow-up Appointments: Follow-up Information    Follow up with Albin Felling, MD On 10/17/2014.   Specialty:  Internal Medicine   Why:  10:45 am   Contact information:   [place] [address] [phone]       Follow up with SATER,RICHARD  A, MD On 10/23/2014.   Specialty:  Neurology   Why:  8:45 am   Contact information:   [place] [address] [phone]       Discharge Instructions: Discharge Instructions    Ambulatory referral to Physical Therapy    Complete by:  As directed   Iontophoresis - 4 mg/ml of dexamethasone:  No  T.E.N.S. Unit Evaluation and Dispense as Indicated:  No  Is this a STAR referral?:  No     Call MD for:  persistant dizziness or light-headedness    Complete by:  As directed      Call MD for:  temperature >100.4    Complete by:  As directed      Increase activity slowly    Complete by:  As directed            Consultations:  PT/OT  Procedures Performed:  Dg Chest 2 View  10/10/2014   CLINICAL DATA:  Fever for 1 day.  EXAM: CHEST  2 VIEW  COMPARISON:  10/08/2014  FINDINGS: Increased small bilateral pleural effusions are seen, with bibasilar atelectasis, left side greater than  right. Mild cardiomegaly and diffuse interstitial edema or pneumonitis show no significant change.  IMPRESSION: No significant change in cardiomegaly and diffuse interstitial edema or pneumonitis.  Increased small bilateral pleural effusions and bibasilar atelectasis.   Electronically Signed   By: Earle Gell M.D.   On: 10/10/2014 21:13   Dg Chest 2 View  10/08/2014   CLINICAL DATA:  Fever.  EXAM: CHEST  2 VIEW  COMPARISON:  10/06/2014  FINDINGS: Shallow inspiration. Mild cardiac enlargement. Normal pulmonary vascularity. Bilateral perihilar and left peripheral interstitial infiltration could indicate interstitial pneumonia or edema. No definite blunting of costophrenic angles although positioning on the lateral view is limited. No pneumothorax. Calcified and tortuous aorta. Degenerative changes in both shoulders. Postoperative resection of distal left clavicle.  IMPRESSION: Cardiac enlargement. Perihilar and left peripheral interstitial infiltrates may indicate edema or interstitial pneumonia.   Electronically Signed   By: Lucienne Capers M.D.   On: 10/08/2014 02:54   Dg Chest 2 View  10/06/2014   CLINICAL DATA:  Status post unwitnessed fall. Concern for chest injury. Initial encounter.  EXAM: CHEST  2 VIEW  COMPARISON:  Chest radiograph performed 07/01/2014  FINDINGS: The lungs are well-aerated. Mild right midlung scarring is again noted. There is no evidence of focal opacification, pleural effusion or pneumothorax.  The heart is mildly enlarged. No acute osseous abnormalities are seen.  IMPRESSION: Mild right mid lung scarring again noted.  Mild cardiomegaly.   Electronically Signed   By: Garald Balding M.D.   On: 10/06/2014 01:09   Ct Head Wo Contrast  10/06/2014   CLINICAL DATA:  Unwitnessed fall.  EXAM: CT HEAD WITHOUT CONTRAST  TECHNIQUE: Contiguous axial images were obtained from the base of the skull through the vertex without intravenous contrast.  COMPARISON:  06/21/2014  FINDINGS: Skull and  Sinuses:Status post frontal craniotomy for excision of atypical meningioma. No evidence of fracture.  Mild mucosal edema in the paranasal sinuses.  Orbits: No acute abnormality.  Brain: Encephalomalacia and gliosis in the right more than left frontal lobes has a stable appearance. This is where atypical meningioma likely invaded the parenchyma. No gross recurrence of the atypical meningioma.  Cerebral volume loss with ventriculomegaly greatest in the frontal regions. Remote perforator infarct affecting the right putamen and corona radiata. Small remote right parietal cortex infarct.  No acute infarct, hemorrhage, hydrocephalus, or  shift  IMPRESSION: 1. No evidence of intracranial injury or fracture. 2. Stable ischemic and postsurgical changes.   Electronically Signed   By: Monte Fantasia M.D.   On: 10/06/2014 02:01   Ct Abdomen Pelvis W Contrast  10/08/2014   CLINICAL DATA:  Fever with altered mental status. Urinary tract infection. History of lung cancer.  EXAM: CT ABDOMEN AND PELVIS WITH CONTRAST  TECHNIQUE: Multidetector CT imaging of the abdomen and pelvis was performed using the standard protocol following bolus administration of intravenous contrast.  CONTRAST:  111m OMNIPAQUE IOHEXOL 300 MG/ML  SOLN  COMPARISON:  CT 10/07/2013, 01/19/2013  FINDINGS: Small bilateral pleural effusions. There is adjacent compressive atelectasis. Scarring in the periphery of the right lower lobe.  Hemangioma in the right hepatic lobe is unchanged in size measuring 6.2 x 5.3 cm allowing for differences in caliper placement. An adjacent/ separate hemangioma in the inferior right hepatic lobe is unchanged. No new hepatic lesion. The gallbladder is physiologically distended. Spleen is at the upper limits of normal in size measuring 12.7 cm. The adrenal glands are normal. Pancreas is atrophic.  Heterogeneous enhancement of both kidneys, left greater than right. Symmetric renal excretion. No intrarenal or perirenal fluid  collection. No hydronephrosis no obstructive uropathy.  Stomach is decompressed. There are no dilated or thickened bowel loops. Diverticulosis throughout the colon without diverticulitis. Multiple small mesenteric lymph nodes in the central mesentery, left greater than right.  Soft tissue mass just inferior to the aortic bifurcation with well-defined margins currently measures 6.3 x 5.6 cm, previously 6.6 x 5.9 cm. Prominent and mildly enlarged upper retroperitoneal lymph nodes, left periaortic lymph node measures 14 mm just inferior to the the left renal vein. Atherosclerosis of normal caliber abdominal aorta and its branches.  Within the pelvis the urinary bladder is physiologically distended with bladder wall thickening. The uterus is surgically absent. There is a small amount of free fluid in the pelvis. Rectal temperature probe is in place. Intramuscular lipoma in the right iliopsoas muscle is unchanged from prior exam.  There are no acute or suspicious osseous abnormalities. Degenerative change in the lower lumbar spine.  IMPRESSION: 1. Heterogeneous enhancement of both kidneys, left greater than right, concerning for urinary tract infection. No perirenal fluid collection. 2. Multiple prominent and mildly enlarged retroperitoneal lymph nodes, may be reactive. However, given history of lung cancer, metastatic involvement is not excluded. Recommend follow-up CT after symptoms have resolved to evaluate for imaging resolution. Additionally there are multiple prominent mesenteric lymph nodes that are new from prior. 3. Small pleural effusions with adjacent compressive atelectasis. 4. Chronic findings include bladder wall thickening, atherosclerosis, hepatic hemangiomas. Soft tissue mass just below the iliac bifurcation. This mass is stable to slightly decreased in size compared to prior exam.   Electronically Signed   By: MJeb LeveringM.D.   On: 10/08/2014 02:24   [place]  10/08/2014   CLINICAL DATA:   Urinary tract infection.  Fever.  EXAM: RENAL / URINARY TRACT ULTRASOUND COMPLETE  COMPARISON:  CT abdomen and pelvis 10/08/2014  FINDINGS: Right Kidney:  Length: 10.4 cm. Echogenicity within normal limits. No mass or hydronephrosis visualized.  Left Kidney:  Length: 10.1 cm. Echogenicity within normal limits. No mass or hydronephrosis visualized.  Bladder:  Appears normal for degree of bladder distention.  IMPRESSION: Normal ultrasound appearance of the kidneys and bladder.   Electronically Signed   By: WLucienne CapersM.D.   On: 10/08/2014 03:21    2D Echo: none   Cardiac  Cath: none  Admission HPI: Ms. TEMIA DEBROUX is a 81 y.o. AA female with past medical history of diabetes mellitus type II, diverticulitis, hyperlipidemia, hypertension, GERD, meningioma w/ seizures (2012), and previous UTIs who presented to the emergency department with altered mental status. At time of our evaluation, patient was the only person available in the room to provide historical information. Per ED notes, patient presented to the emergency department after a fall at home and having altered mental status and reported lethargy since the day prior. During our exam of the patient, she denied having a recent fall, stating that she had fallen a few weeks prior. In the ED, patient did have a CT head that showed no evidence of intracranial injury or fracture. She also had a 2 view chest xray that showed mild lung scarring and mild cardiomegaly but did not show an acute process or pneumonia. Urinalysis performed was indicative of UTI with many bacteria, large amount of leukocytes, and positive nitrites, WBC too numerous to count, and rare squamous epithelial cells. Urine culture has been sent. Patient reports that she has been experiencing some dysuria for about two weeks now, but denies any increased frequency, burning with urination. She also denies any fever, nausea, vomiting, diarrhea, and states her bowels have been  working fine. She does endorse having chills but also states that . She remains slightly altered. She was alert and oriented to person and time, but remains confused as to place thinking that she is currently at home. She received a dose of ceftriaxone 1 gm IV in the emergency department and 1L NS bolus.   Hospital Course by problem list:  UTI (lower urinary tract infection):  UA was positive for leukocytes and nitrites and patient was started on Ceftriaxone 1g. Urine culture showed no growth.  WBC trended upwards to 18.4 during stay and is now 10.2.  Afebrile for almost 48 hours. No complaints of fever, chills, dysuria, nausea or suprapubic pain. Patient has completed 7 days of antibiotics.Patient's mental status has greatly improved from prior. Ongoing fever may have been secondary to lamictal as fever has resolved after discontinuation and mental status has greatly improved. ANA negative.    Fever:  Patient was afebrile and WBC nml on admission.  On 8/20 Chest X-ray showed no evidence of focal opacification, pleural effusion  or pneumothorax. During stay patient developed fevers (104.4 - 100.7) Antibiotics were broadened from Ceftriaxone to Ceftazidime, Doxycycline and Vancomycin.  With low suspicion for tick borne illness Doxycycline was discontinued.  Blood Cultures showed no growth at 24 hrs.  CT of Abd and Pelvis showed concern for urinary tract infection, no abscess noted. Renal US showed normal appearance of kidneys and bladder, no pyelonephritis suspected.After several days on broad spectrum antibiotics other sources for fever were considered.  Labs looking for inflammation were ordered.  RF 11.8 nml, Ferritin 419 slightly elevated, Sed rate 72 elevated, HIV antibody non reactive and negative ANA.  A repeat Chest X-ray on 8/24 was ordered and showed increased small bilateral pleural effusion and bibasilar atelectasis. Not concerned for pneumonia since patient was on broad spectrum  antibiotics for over 72hrs and patient was improving clinically over the past several days. Also not concerned for fluid overload as lung sounds were clear b/l and no peripheral edema noted.  Also, on the differential was drug fever by either beta lactams or anti-seizure medications. Cephalosporins was stopped 8/24 for the first time on admission with an improvement of fever following. Lamictal was tapered  8/24 and stopped 8/25. Patient was afebrile for about 48 hours prior to discharge.  Patient will need follow up with her PCP at [facility] on Aug 31 at 10:45    Diabetes mellitus with mild nonproliferative diabetic retinopathy:  Patient's BS during stay (189 -100).  Patient was on 10 units Lantus qHS and SSI-S.  On 8/24 Lantus was decreased to 5 units qHS and continued SSI-S as patient's BS was trending downwards to 100. Well controlled during admission. Restarted back on home regimen.     Essential hypertension:  Patient had elevated BP during admission (135/56 -200/83).  Patient was on Coreg and Lisinopril during stay.  Lisinopril was increased from '20mg'$  to '40mg'$  on 8/25. Patient's BP improved prior to discharge and was discharged on Coreg 25 mg BID and lisinopril 40 mg daily. If still hypertensive on follow up, consider adding amlodipine 5 mg daily.     Seizure disorder:  Patient did not clinically present with seizures during admission.  [place] daughter reported a change in patient's appetite, energy level and hygiene abilities after 4 days starting Lamictal this month.  With hx of behavioral change and thought of drug fever Lamictal was tapered and stopped during admission.  Keppra was continued throughout the entire admission.  Patient will need to follow up with [facility] on 9/6 at 8:45am.    Altered mental status: Most likely due to UTI. ED notes state patient had fallen at home. CT of head on 8/20 showed no intracranial injury or fracture.   Mental status remained stable throughout admission, always A&Ox 2/3. Patient greatly improved the day of discharge and was very awake and alert.  HLD: On simvastatin 20 mg daily. Meets criteria for high intensity statin. Given numerous medication changes, this change was not made as an inpatient. Consider increasing medication as outpatient.   CKD 3: At baseline, creatinine 0.89 on discharge. Please recheck BMET as outpatient  Interstitial Edema vs Pneumonitis on CXR: Mild crackles in left lower lung base suspicious for edema versus atelectasis. Patient denies any shortness of breath.SpO2 100% on RA on discharge.   Discharge Vitals:   BP 180/78 mmHg  Pulse 81  Temp(Src) 98.9 F (37.2 C) (Oral)  Resp 18  Ht '5\' 4"'$  (1.626 m)  Wt 204 lb 8 oz (92.761 kg)  BMI 35.09 kg/m2  SpO2 96%  Discharge Labs:  Results for orders placed or performed during the hospital encounter of 10/05/14 (from the past 24 hour(s))  Glucose, capillary     Status: Abnormal   Collection Time: 10/11/14 12:13 PM  Result Value Ref Range   Glucose-Capillary 108 (H) 65 - 99 mg/dL  Glucose, capillary     Status: Abnormal   Collection Time: 10/11/14  5:26 PM  Result Value Ref Range   Glucose-Capillary 105 (H) 65 - 99 mg/dL  Glucose, capillary     Status: Abnormal   Collection Time: 10/11/14  9:54 PM  Result Value Ref Range   Glucose-Capillary 110 (H) 65 - 99 mg/dL  CBC     Status: Abnormal   Collection Time: 10/12/14  4:06 AM  Result Value Ref Range   WBC 10.2 4.0 - 10.5 K/uL   RBC 3.41 (L) 3.87 - 5.11 MIL/uL   Hemoglobin 8.3 (L) 12.0 - 15.0 g/dL   HCT 27.6 (L) 36.0 - 46.0 %   MCV 80.9 78.0 - 100.0 fL   MCH 24.3 (L) 26.0 - 34.0 pg   MCHC 30.1 30.0 - 36.0 g/dL  RDW 17.1 (H) 11.5 - 15.5 %   Platelets 362 150 - 400 K/uL  Basic metabolic panel Once     Status: Abnormal   Collection Time: 10/12/14  4:06 AM  Result Value Ref Range   Sodium 149 (H) 135 - 145 mmol/L   Potassium 3.7 3.5 - 5.1 mmol/L   Chloride  114 (H) 101 - 111 mmol/L   CO2 26 22 - 32 mmol/L   Glucose, Bld 117 (H) 65 - 99 mg/dL   BUN 14 6 - 20 mg/dL   Creatinine, Ser 0.89 0.44 - 1.00 mg/dL   Calcium 8.2 (L) 8.9 - 10.3 mg/dL   GFR calc non Af Amer >60 >60 mL/min   GFR calc Af Amer >60 >60 mL/min   Anion gap 9 5 - 15  Glucose, capillary     Status: Abnormal   Collection Time: 10/12/14  6:35 AM  Result Value Ref Range   Glucose-Capillary 116 (H) 65 - 99 mg/dL    Signed: Osa Craver, DO PGY-2 Internal Medicine Resident Pager # [phone] 10/12/2014 11:27 AM  Services Ordered on Discharge: PT Equipment Ordered on Discharge: None

## 2014-10-11 NOTE — Progress Notes (Signed)
  Date: 10/11/2014  Patient name: [name]  Medical record number: [id number]  Date of birth: 19-Feb-1933   This patient has been seen and the plan of care was discussed with the house staff. Please see their note for complete details. I concur with their findings with the following additions/corrections: Ms Malacara's temp is now decreasing. No further evidence of another infxn was found. Sed rate was elevated as expected but other inflamm W/U negative. Cont OFF lamictal. Cont levaquin for full course. PT/OT. If remains afebrile in AM, consider D/C to home.   Bartholomew Crews, MD 10/11/2014, 3:26 PM

## 2014-10-11 NOTE — Telephone Encounter (Signed)
I spoke with [name] yesterday and advised that per RAS, ok to stop Lamictal and start Oxcarbazepine '150mg'$  po bid.  She verbalized understanding of same/fim

## 2014-10-11 NOTE — Progress Notes (Signed)
Subjective: [name] was evaluated this morning.  Patient was slow to awaken but with time she opened her eyes and was able to communicate and follow commands.   Patient had breakfast in the room which she had not eaten stating she did not have an appetite.  Patient denied SOB, chest pain, abdominal pain, or muscle/joint pain.   Objective: Vital signs in last 24 hours: Filed Vitals:   10/10/14 1352 10/10/14 2210 10/11/14 0557 10/11/14 1100  BP: 149/44 181/66 188/64 181/70  Pulse: 82 83 88 81  Temp: 100.8 F (38.2 C) 99 F (37.2 C) 99.6 F (37.6 C) 98.9 F (37.2 C)  TempSrc: Rectal Axillary  Oral  Resp: 16  16   Height:      Weight:      SpO2: 96% 93% 91%    Weight change:   Intake/Output Summary (Last 24 hours) at 10/11/14 1203 Last data filed at 10/11/14 0020  Gross per 24 hour  Intake  677.5 ml  Output      0 ml  Net  677.5 ml   General: resting in bed Cardiac: RRR, no rubs, murmurs or gallops Pulm: clear to auscultation bilaterally, moving normal volumes of air Abd: soft, nontender, nondistended, BS present Ext: warm and well perfused, slight edema in right ankle. Neuro: alert and oriented X3  Lab Results: Lab Results  Component Value Date   WBC 13.7* 10/11/2014   HGB 8.1* 10/11/2014   HCT 26.1* 10/11/2014   MCV 84.2 10/11/2014   PLT 297 10/11/2014   BMP Latest Ref Rng 10/11/2014 10/10/2014 10/09/2014  Glucose 65 - 99 mg/dL 107(H) 91 124(H)  BUN 6 - 20 mg/dL '12 12 11  '$ Creatinine 0.44 - 1.00 mg/dL 1.00 1.10(H) 1.04(H)  Sodium 135 - 145 mmol/L 143 144 142  Potassium 3.5 - 5.1 mmol/L 3.4(L) 3.0(L) 3.4(L)  Chloride 101 - 111 mmol/L 110 109 109  CO2 22 - 32 mmol/L '25 25 23  '$ Calcium 8.9 - 10.3 mg/dL 8.0(L) 8.1(L) 8.1(L)     Micro Results: Recent Results (from the past 240 hour(s))  Urine culture     Status: None   Collection Time: 10/06/14  1:49 AM  Result Value Ref Range Status   Specimen Description URINE, CATHETERIZED  Final   Special Requests NONE  Final    Culture MULTIPLE SPECIES PRESENT, SUGGEST RECOLLECTION  Final   Report Status 10/07/2014 FINAL  Final  Culture, blood (routine x 2)     Status: None (Preliminary result)   Collection Time: 10/06/14  8:00 PM  Result Value Ref Range Status   Specimen Description BLOOD RIGHT ANTECUBITAL  Final   Special Requests BOTTLES DRAWN AEROBIC AND ANAEROBIC 10CC  Final   Culture NO GROWTH 4 DAYS  Final   Report Status PENDING  Incomplete  Culture, blood (routine x 2)     Status: None (Preliminary result)   Collection Time: 10/06/14  8:10 PM  Result Value Ref Range Status   Specimen Description BLOOD RIGHT HAND  Final   Special Requests 8CC  Final   Culture NO GROWTH 4 DAYS  Final   Report Status PENDING  Incomplete  MRSA PCR Screening     Status: None   Collection Time: 10/08/14 10:37 AM  Result Value Ref Range Status   MRSA by PCR NEGATIVE NEGATIVE Final    Comment:        The GeneXpert MRSA Assay (FDA approved for NASAL specimens only), is one component of a comprehensive MRSA colonization surveillance program.  It is not intended to diagnose MRSA infection nor to guide or monitor treatment for MRSA infections.   Culture, Urine     Status: None   Collection Time: 10/08/14  1:52 PM  Result Value Ref Range Status   Specimen Description URINE, RANDOM  Final   Special Requests NONE  Final   Culture NO GROWTH 1 DAY  Final   Report Status 10/09/2014 FINAL  Final   Studies/Results: Dg Chest 2 View  10/10/2014   CLINICAL DATA:  Fever for 1 day.  EXAM: CHEST  2 VIEW  COMPARISON:  10/08/2014  FINDINGS: Increased small bilateral pleural effusions are seen, with bibasilar atelectasis, left side greater than right. Mild cardiomegaly and diffuse interstitial edema or pneumonitis show no significant change.  IMPRESSION: No significant change in cardiomegaly and diffuse interstitial edema or pneumonitis.  Increased small bilateral pleural effusions and bibasilar atelectasis.   Electronically Signed    By: Earle Gell M.D.   On: 10/10/2014 21:13   Medications: I have reviewed the patient's current medications. Scheduled Meds: . antiseptic oral rinse  7 mL Mouth Rinse q12n4p  . aspirin EC  81 mg Oral Daily  . carvedilol  25 mg Oral BID WC  . chlorhexidine  15 mL Mouth Rinse BID  . enoxaparin (LOVENOX) injection  40 mg Subcutaneous Daily  . feeding supplement (GLUCERNA SHAKE)  237 mL Oral TID BM  . insulin aspart  0-5 Units Subcutaneous QHS  . insulin aspart  0-9 Units Subcutaneous TID WC  . insulin glargine  5 Units Subcutaneous QHS  . lamoTRIgine  50 mg Oral BID  . levETIRAcetam  750 mg Intravenous Q12H  . [START ON 10/12/2014] levofloxacin  750 mg Oral Q48H  . lisinopril  20 mg Oral Daily  . potassium chloride  40 mEq Oral BID  . simvastatin  20 mg Oral q1800   Continuous Infusions:  PRN Meds:.acetaminophen, food thickener Assessment/Plan: Fever: WBC has been trending downwards and currently 13.7 Patient is afebrile.  Last 3 temp readings 99, 99.6, 98.9.  Chest X-ray yesterday showed increased small bilateral pleural effusion and bibasilar atelectasis.  Not concerned for pneumonia at this time as patient was on broad spectrum antibiotics for over 72hrs.  Patient has improved clinically over the past several days becoming more alert during patient interviews.  Lung sounds were clear b/l and no peripheral edema noted.  Also not concerned for fluid overload at this time.  Will continue to monitor patient presentation.   As fever was not improving over the past several days other sources for fever were investigated.  RF 11.8 nml, Ferritin 419 slightly elevated, Sed rate 72 elevated, HIV antibody non reactive.  Awaiting ANA results.  With fevers appearing stable at this time will continue to monitor.   Also, on the differential was drug fever by either beta lactams and anti-seizure medications.   Cephalosporins was stopped yesterday for the first time on admission with an improvement of  fever following.  Lamictal was tapered yesterday and stopped today.      - Continue Levaquin   - AM CBC, monitor   Hypokalemia: Currently K 3.4 - Repleat with 24mq of Kdur BID  Seizure disorder  -Continue Keppra and stop Lamictal  Essential hypertension: mild hypertension  -Continue Coreg and increase Lisinopril to '40mg'$  daily  DM type II: Current BS 108  - decrease to 5 units Lantus qHS - continue SSI-S  This is a MCareers information officerNote.  The care of the patient was  discussed with Dr. Redmond Pulling and the assessment and plan formulated with their assistance.  Please see their attached note for official documentation of the daily encounter.   LOS: 5 days   Valinda Party, Med Student 10/11/2014, 12:03 PM

## 2014-10-12 ENCOUNTER — Emergency Department (HOSPITAL_COMMUNITY): Payer: Medicare Other

## 2014-10-12 ENCOUNTER — Encounter (HOSPITAL_COMMUNITY): Payer: Self-pay | Admitting: Emergency Medicine

## 2014-10-12 ENCOUNTER — Inpatient Hospital Stay (HOSPITAL_COMMUNITY)
Admission: EM | Admit: 2014-10-12 | Discharge: 2014-10-15 | Disposition: A | Discharge status: Home / Self Care | Payer: Medicare Other | Source: Home / Self Care | Attending: Internal Medicine | Admitting: Internal Medicine

## 2014-10-12 DIAGNOSIS — W19XXXA Unspecified fall, initial encounter: Secondary | ICD-10-CM | POA: Insufficient documentation

## 2014-10-12 DIAGNOSIS — R102 Pelvic and perineal pain: Secondary | ICD-10-CM | POA: Diagnosis present

## 2014-10-12 DIAGNOSIS — Z86011 Personal history of benign neoplasm of the brain: Secondary | ICD-10-CM

## 2014-10-12 DIAGNOSIS — Z9841 Cataract extraction status, right eye: Secondary | ICD-10-CM

## 2014-10-12 DIAGNOSIS — I152 Hypertension secondary to endocrine disorders: Secondary | ICD-10-CM | POA: Diagnosis present

## 2014-10-12 DIAGNOSIS — E1165 Type 2 diabetes mellitus with hyperglycemia: Secondary | ICD-10-CM

## 2014-10-12 DIAGNOSIS — Z90710 Acquired absence of both cervix and uterus: Secondary | ICD-10-CM

## 2014-10-12 DIAGNOSIS — E1140 Type 2 diabetes mellitus with diabetic neuropathy, unspecified: Secondary | ICD-10-CM | POA: Diagnosis present

## 2014-10-12 DIAGNOSIS — G9340 Encephalopathy, unspecified: Principal | ICD-10-CM | POA: Diagnosis present

## 2014-10-12 DIAGNOSIS — F329 Major depressive disorder, single episode, unspecified: Secondary | ICD-10-CM | POA: Diagnosis present

## 2014-10-12 DIAGNOSIS — J9811 Atelectasis: Secondary | ICD-10-CM | POA: Diagnosis present

## 2014-10-12 DIAGNOSIS — M1990 Unspecified osteoarthritis, unspecified site: Secondary | ICD-10-CM | POA: Diagnosis present

## 2014-10-12 DIAGNOSIS — R296 Repeated falls: Secondary | ICD-10-CM | POA: Diagnosis present

## 2014-10-12 DIAGNOSIS — Z961 Presence of intraocular lens: Secondary | ICD-10-CM | POA: Diagnosis present

## 2014-10-12 DIAGNOSIS — Z85841 Personal history of malignant neoplasm of brain: Secondary | ICD-10-CM

## 2014-10-12 DIAGNOSIS — E119 Type 2 diabetes mellitus without complications: Secondary | ICD-10-CM | POA: Diagnosis present

## 2014-10-12 DIAGNOSIS — K219 Gastro-esophageal reflux disease without esophagitis: Secondary | ICD-10-CM | POA: Diagnosis present

## 2014-10-12 DIAGNOSIS — F0390 Unspecified dementia without behavioral disturbance: Secondary | ICD-10-CM | POA: Diagnosis present

## 2014-10-12 DIAGNOSIS — N183 Chronic kidney disease, stage 3 (moderate): Secondary | ICD-10-CM | POA: Diagnosis present

## 2014-10-12 DIAGNOSIS — E1159 Type 2 diabetes mellitus with other circulatory complications: Secondary | ICD-10-CM | POA: Diagnosis present

## 2014-10-12 DIAGNOSIS — E785 Hyperlipidemia, unspecified: Secondary | ICD-10-CM

## 2014-10-12 DIAGNOSIS — Z9842 Cataract extraction status, left eye: Secondary | ICD-10-CM

## 2014-10-12 DIAGNOSIS — N179 Acute kidney failure, unspecified: Secondary | ICD-10-CM | POA: Diagnosis present

## 2014-10-12 DIAGNOSIS — Z7982 Long term (current) use of aspirin: Secondary | ICD-10-CM

## 2014-10-12 DIAGNOSIS — Z87891 Personal history of nicotine dependence: Secondary | ICD-10-CM

## 2014-10-12 DIAGNOSIS — F32A Depression, unspecified: Secondary | ICD-10-CM | POA: Diagnosis present

## 2014-10-12 DIAGNOSIS — R55 Syncope and collapse: Secondary | ICD-10-CM | POA: Diagnosis present

## 2014-10-12 DIAGNOSIS — Z794 Long term (current) use of insulin: Secondary | ICD-10-CM

## 2014-10-12 DIAGNOSIS — Z79899 Other long term (current) drug therapy: Secondary | ICD-10-CM

## 2014-10-12 DIAGNOSIS — M2540 Effusion, unspecified joint: Secondary | ICD-10-CM

## 2014-10-12 DIAGNOSIS — E876 Hypokalemia: Secondary | ICD-10-CM | POA: Diagnosis present

## 2014-10-12 DIAGNOSIS — I509 Heart failure, unspecified: Secondary | ICD-10-CM | POA: Diagnosis present

## 2014-10-12 DIAGNOSIS — Z8673 Personal history of transient ischemic attack (TIA), and cerebral infarction without residual deficits: Secondary | ICD-10-CM

## 2014-10-12 DIAGNOSIS — I129 Hypertensive chronic kidney disease with stage 1 through stage 4 chronic kidney disease, or unspecified chronic kidney disease: Secondary | ICD-10-CM | POA: Diagnosis present

## 2014-10-12 DIAGNOSIS — I10 Essential (primary) hypertension: Secondary | ICD-10-CM

## 2014-10-12 DIAGNOSIS — G40909 Epilepsy, unspecified, not intractable, without status epilepticus: Secondary | ICD-10-CM | POA: Diagnosis present

## 2014-10-12 DIAGNOSIS — D649 Anemia, unspecified: Secondary | ICD-10-CM | POA: Diagnosis present

## 2014-10-12 DIAGNOSIS — E1169 Type 2 diabetes mellitus with other specified complication: Secondary | ICD-10-CM | POA: Diagnosis present

## 2014-10-12 DIAGNOSIS — E11329 Type 2 diabetes mellitus with mild nonproliferative diabetic retinopathy without macular edema: Secondary | ICD-10-CM | POA: Diagnosis present

## 2014-10-12 DIAGNOSIS — D509 Iron deficiency anemia, unspecified: Secondary | ICD-10-CM | POA: Diagnosis present

## 2014-10-12 DIAGNOSIS — I951 Orthostatic hypotension: Secondary | ICD-10-CM

## 2014-10-12 DIAGNOSIS — Z85118 Personal history of other malignant neoplasm of bronchus and lung: Secondary | ICD-10-CM

## 2014-10-12 DIAGNOSIS — N390 Urinary tract infection, site not specified: Secondary | ICD-10-CM | POA: Diagnosis present

## 2014-10-12 LAB — URINALYSIS, ROUTINE W REFLEX MICROSCOPIC
BILIRUBIN URINE: NEGATIVE
Glucose, UA: NEGATIVE mg/dL
Hgb urine dipstick: NEGATIVE
Ketones, ur: 15 mg/dL — AB
LEUKOCYTES UA: NEGATIVE
NITRITE: NEGATIVE
Protein, ur: 30 mg/dL — AB
SPECIFIC GRAVITY, URINE: 1.02 (ref 1.005–1.030)
UROBILINOGEN UA: 0.2 mg/dL (ref 0.0–1.0)
pH: 5 (ref 5.0–8.0)

## 2014-10-12 LAB — BASIC METABOLIC PANEL
Anion gap: 9 (ref 5–15)
BUN: 14 mg/dL (ref 6–20)
CO2: 26 mmol/L (ref 22–32)
Calcium: 8.2 mg/dL — ABNORMAL LOW (ref 8.9–10.3)
Chloride: 114 mmol/L — ABNORMAL HIGH (ref 101–111)
Creatinine, Ser: 0.89 mg/dL (ref 0.44–1.00)
GFR calc Af Amer: >60 mL/min (ref >60)
GFR calc non Af Amer: >60 mL/min (ref >60)
GLUCOSE: 117 mg/dL — AB (ref 65–99)
POTASSIUM: 3.7 mmol/L (ref 3.5–5.1)
Sodium: 149 mmol/L — ABNORMAL HIGH (ref 135–145)

## 2014-10-12 LAB — CBC
HEMATOCRIT: 27.6 % — AB (ref 36.0–46.0)
HEMOGLOBIN: 8.3 g/dL — AB (ref 12.0–15.0)
MCH: 24.3 pg — AB (ref 26.0–34.0)
MCHC: 30.1 g/dL (ref 30.0–36.0)
MCV: 80.9 fL (ref 78.0–100.0)
Platelets: 362 10*3/uL (ref 150–400)
RBC: 3.41 MIL/uL — AB (ref 3.87–5.11)
RDW: 17.1 % — ABNORMAL HIGH (ref 11.5–15.5)
WBC: 10.2 10*3/uL (ref 4.0–10.5)

## 2014-10-12 LAB — URINE MICROSCOPIC-ADD ON

## 2014-10-12 LAB — GLUCOSE, CAPILLARY
GLUCOSE-CAPILLARY: 116 mg/dL — AB (ref 65–99)
Glucose-Capillary: 166 mg/dL — ABNORMAL HIGH (ref 65–99)

## 2014-10-12 MED ORDER — LEVETIRACETAM 100 MG/ML PO SOLN
750.0000 mg | Freq: Two times a day (BID) | ORAL | Status: DC
  Administered 2014-10-12 (×2): 750 mg via ORAL
  Filled 2014-10-12 (×3): qty 7.5

## 2014-10-12 MED ORDER — LISINOPRIL 20 MG PO TABS
40.0000 mg | ORAL_TABLET | Freq: Once | ORAL | Status: AC
  Administered 2014-10-12: 40 mg via ORAL
  Filled 2014-10-12: qty 2

## 2014-10-12 MED ORDER — LEVOFLOXACIN IN D5W 250 MG/50ML IV SOLN
250.0000 mg | Freq: Once | INTRAVENOUS | Status: DC
  Filled 2014-10-12: qty 50

## 2014-10-12 MED ORDER — LISINOPRIL 40 MG PO TABS
40.0000 mg | ORAL_TABLET | Freq: Every day | ORAL | Status: DC
Start: 2014-10-12 — End: 2014-10-18

## 2014-10-12 NOTE — ED Notes (Signed)
Per EMS, family found patient on the floor when patient was previously sitting in chair. Hx of seizures, and was recently weened off lamictal. Also recently treated for UTI. cbg 174.

## 2014-10-12 NOTE — Progress Notes (Signed)
Speech Language Pathology Treatment: Dysphagia  Patient Details Name: [name] MRN: [id number] DOB: 04/03/1933 Today's Date: 10/12/2014 Time: 1112-1127 SLP Time Calculation (min) (ACUTE ONLY): 15 min  Assessment / Plan / Recommendation Clinical Impression  Pt is alert this morning and seems to have clearer mentation than as described in previous notes. Multiple family members are present which seems to distract pt from self-feeding, however despite noisy environment she has very mild oral holding with advanced solids and liquids. No overt signs of aspiration are observed. Recommend advancement to Dys 2 diet and thin liquids. Provided education to family present regarding recommended textures as well as recommended strategies, with emphasis on reducing environmental distractions and monitoring for oral clearance and coughing.   HPI Other Pertinent Information: 81 year old female admitted 10/05/14 due to fall, AMS. PMH significant for DM, GERD, meningioma with seizures, UTI.    Pertinent Vitals Pain Assessment: No/denies pain  SLP Plan  Continue with current plan of care    Recommendations Diet recommendations: Dysphagia 2 (fine chop);Thin liquid Liquids provided via: Straw;Cup Medication Administration: Crushed with puree Supervision: Patient able to self feed;Full supervision/cueing for compensatory strategies;Trained caregiver to feed patient Compensations: Minimize environmental distractions;Slow rate;Small sips/bites;Check for pocketing;Follow solids with liquid Postural Changes and/or Swallow Maneuvers: Seated upright 90 degrees;Upright 30-60 min after meal       Oral Care Recommendations: Oral care before and after PO Follow up Recommendations: 24 hour supervision/assistance;Home health SLP;Skilled Nursing facility Plan: Continue with current plan of care    Germain Osgood, M.A. CCC-SLP [phone]  Germain Osgood 10/12/2014, 12:06 PM

## 2014-10-12 NOTE — ED Notes (Signed)
Reported bp 200/80s to Dr. Thurmond Butts. MD acknowledges.

## 2014-10-12 NOTE — Progress Notes (Signed)
Subjective: [name] is a 81 y.o. female admitted for acute encephalopathy and fever secondary to UTI. Patient has completed 7 days of antibiotics, has been fever free for almost 48 hours, and mental status has greatly improved. Patient admits to an anterior chest wall pain anterior to her left 10th rib which hurts with movement and palpation. She denies any shortness of breath.  Objective: Filed Vitals:   10/11/14 1846 10/11/14 2018 10/12/14 0100 10/12/14 0704  BP: 142/78 193/61 180/60 180/78  Pulse:  83  81  Temp:  98.9 F (37.2 C)  98.9 F (37.2 C)  TempSrc:  Oral  Oral  Resp:  18  18  Height:      Weight:      SpO2:  100%  96%   General: Vital signs reviewed.  Patient is elderly female, in no acute distress and cooperative with exam.  Cardiovascular: RRR, S1 normal, S2 normal Pulmonary/Chest: Mild inspiratory crackles in the left lower base, no wheezes, or rhonchi. Abdominal: Soft, non-tender, non-distended, BS + Extremities: No lower extremity edema bilaterally, pulses symmetric and intact bilaterally.  Neurological: Awake and alert, thought content appropriate.  Skin: Warm, dry and intact. No rashes or erythema.  Lab Results: Basic Metabolic Panel:  Recent Labs Lab 10/06/14 0443  10/11/14 0519 10/12/14 0406  NA 140  < > 143 149*  K 3.2*  < > 3.4* 3.7  CL 105  < > 110 114*  CO2 28  < > 25 26  GLUCOSE 273*  < > 107* 117*  BUN 16  < > 12 14  CREATININE 1.07*  < > 1.00 0.89  CALCIUM 7.9*  < > 8.0* 8.2*  MG 1.7  --  1.9  --   < > = values in this interval not displayed. Liver Function Tests:  Recent Labs Lab 10/06/14 0019  AST 14*  ALT 10*  ALKPHOS 63  BILITOT 0.6  PROT 6.2*  ALBUMIN 2.9*    Recent Labs Lab 10/06/14 0019  LIPASE 19*   CBC:  Recent Labs Lab 10/10/14 0455 10/11/14 0519 10/12/14 0406  WBC 13.3* 13.7* 10.2  NEUTROABS 10.2* 9.7*  --   HGB 9.3* 8.1* 8.3*  HCT 29.0* 26.1* 27.6*  MCV 85.5 84.2 80.9  PLT 289 297 362    CBG:  Recent Labs Lab 10/10/14 2144 10/11/14 0642 10/11/14 1213 10/11/14 1726 10/11/14 2154 10/12/14 0635  GLUCAP 102* 103* 108* 105* 110* 116*   Hemoglobin A1C:  Recent Labs Lab 10/06/14 1004  HGBA1C 10.2*   Anemia Panel:  Recent Labs Lab 10/10/14 1335  FERRITIN 419*   Urine Drug Screen: Drugs of Abuse     Component Value Date/Time   LABOPIA NONE DETECTED 10/06/2013 0136   LABOPIA PPS 06/27/2012 1645   COCAINSCRNUR NONE DETECTED 10/06/2013 0136   COCAINSCRNUR NEG 06/27/2012 1645   LABBENZ NONE DETECTED 10/06/2013 0136   LABBENZ PPS 06/27/2012 1645   LABBENZ NEG 07/02/2006 2021   AMPHETMU NONE DETECTED 10/06/2013 0136   AMPHETMU NEG 06/27/2012 1645   AMPHETMU NEG 07/02/2006 2021   THCU NONE DETECTED 10/06/2013 0136   THCU NEG 06/27/2012 1645   LABBARB NONE DETECTED 10/06/2013 0136   LABBARB NEG 06/27/2012 1645    Alcohol Level: No results for input(s): ETH in the last 168 hours. Urinalysis:  Recent Labs Lab 10/06/14 0149  COLORURINE YELLOW  LABSPEC 1.025  PHURINE 5.5  GLUCOSEU >1000*  HGBUR TRACE*  BILIRUBINUR NEGATIVE  KETONESUR 15*  PROTEINUR 30*  UROBILINOGEN 1.0  NITRITE POSITIVE*  LEUKOCYTESUR LARGE*   Micro Results: Recent Results (from the past 240 hour(s))  Urine culture     Status: None   Collection Time: 10/06/14  1:49 AM  Result Value Ref Range Status   Specimen Description URINE, CATHETERIZED  Final   Special Requests NONE  Final   Culture MULTIPLE SPECIES PRESENT, SUGGEST RECOLLECTION  Final   Report Status 10/07/2014 FINAL  Final  Culture, blood (routine x 2)     Status: None   Collection Time: 10/06/14  8:00 PM  Result Value Ref Range Status   Specimen Description BLOOD RIGHT ANTECUBITAL  Final   Special Requests BOTTLES DRAWN AEROBIC AND ANAEROBIC 10CC  Final   Culture NO GROWTH 5 DAYS  Final   Report Status 10/11/2014 FINAL  Final  Culture, blood (routine x 2)     Status: None   Collection Time: 10/06/14  8:10 PM   Result Value Ref Range Status   Specimen Description BLOOD RIGHT HAND  Final   Special Requests [place]  Final   Culture NO GROWTH 5 DAYS  Final   Report Status 10/11/2014 FINAL  Final  MRSA PCR Screening     Status: None   Collection Time: 10/08/14 10:37 AM  Result Value Ref Range Status   MRSA by PCR NEGATIVE NEGATIVE Final    Comment:        The GeneXpert MRSA Assay (FDA approved for NASAL specimens only), is one component of a comprehensive MRSA colonization surveillance program. It is not intended to diagnose MRSA infection nor to guide or monitor treatment for MRSA infections.   Culture, Urine     Status: None   Collection Time: 10/08/14  1:52 PM  Result Value Ref Range Status   Specimen Description URINE, RANDOM  Final   Special Requests NONE  Final   Culture NO GROWTH 1 DAY  Final   Report Status 10/09/2014 FINAL  Final   Studies/Results: Dg Chest 2 View  10/10/2014   CLINICAL DATA:  Fever for 1 day.  EXAM: CHEST  2 VIEW  COMPARISON:  10/08/2014  FINDINGS: Increased small bilateral pleural effusions are seen, with bibasilar atelectasis, left side greater than right. Mild cardiomegaly and diffuse interstitial edema or pneumonitis show no significant change.  IMPRESSION: No significant change in cardiomegaly and diffuse interstitial edema or pneumonitis.  Increased small bilateral pleural effusions and bibasilar atelectasis.   Electronically Signed   By: Earle Gell M.D.   On: 10/10/2014 21:13   Medications:  I have reviewed the patient's current medications. Prior to Admission:  Prescriptions prior to admission  Medication Sig Dispense Refill Last Dose  . acetaminophen-codeine (TYLENOL #3) 300-30 MG per tablet Take 1 tablet by mouth every 6 (six) hours as needed for moderate pain. 90 tablet 0 unk  . aspirin EC 81 MG tablet Take 81 mg by mouth daily.   10/05/2014 at Unknown time  . carvedilol (COREG) 25 MG tablet Take 1 tablet (25 mg total) by mouth 2 (two) times daily with  a meal. 180 tablet 4 10/05/2014 at 2200  . diclofenac sodium (VOLTAREN) 1 % GEL Apply 4 g topically 4 (four) times daily. (Patient taking differently: Apply 4 g topically 4 (four) times daily as needed (pain). ) 100 g 1 unk  . FERREX 150 150 MG capsule TAKE 1 CAPSULE BY MOUTH DAILY. 30 capsule 3 10/05/2014 at Unknown time  . FLUoxetine (PROZAC) 10 MG capsule TAKE 1 CAPSULE (10 MG TOTAL) BY MOUTH AT BEDTIME. [place]  capsule 1 10/05/2014 at Unknown time  . glucose blood (ACCU-CHEK AVIVA) test strip Use to Check Blood Sugars 3 Times Daily.Dx Code: 250.00. Insulin dependent 100 each 12 10/05/2014 at Unknown time  . insulin aspart (NOVOLOG FLEXPEN) 100 UNIT/ML FlexPen Novolog Insulin: 91 - 160 5 units 161- 230 7 units 231-300 9 units 301- 370 11 units 371 - 440 13 units 441 - 510 15 units 15 mL 11 10/05/2014 at Unknown time  . Insulin Glargine (LANTUS SOLOSTAR) 100 UNIT/ML Solostar Pen Inject 17 Units into the skin at bedtime. 10 mL 2 10/05/2014 at Unknown time  . lamoTRIgine (LAMICTAL) 100 MG tablet Take 1 tablet (100 mg total) by mouth 2 (two) times daily. 60 tablet 11 10/05/2014 at Unknown time  . levETIRAcetam (KEPPRA) 750 MG tablet Take 1 tablet (750 mg total) by mouth 2 (two) times daily. 60 tablet 11 10/05/2014 at Unknown time  . lisinopril (PRINIVIL,ZESTRIL) 10 MG tablet Take 2 tablets (20 mg total) by mouth daily. 180 tablet 3 10/05/2014 at Unknown time  . omeprazole (PRILOSEC) 20 MG capsule Take 1 capsule (20 mg total) by mouth daily. 30 capsule 3 10/05/2014 at Unknown time  . simvastatin (ZOCOR) 20 MG tablet Take 20 mg by mouth daily.   10/05/2014 at Unknown time   Scheduled Meds: . antiseptic oral rinse  7 mL Mouth Rinse q12n4p  . aspirin EC  81 mg Oral Daily  . carvedilol  25 mg Oral BID WC  . chlorhexidine  15 mL Mouth Rinse BID  . enoxaparin (LOVENOX) injection  40 mg Subcutaneous Daily  . feeding supplement (GLUCERNA SHAKE)  237 mL Oral TID BM  . insulin aspart  0-5 Units  Subcutaneous QHS  . insulin aspart  0-9 Units Subcutaneous TID WC  . insulin glargine  5 Units Subcutaneous QHS  . levETIRAcetam  750 mg Oral BID  . levofloxacin (LEVAQUIN) IV  250 mg Intravenous Once  . levofloxacin  250 mg Oral QHS  . lisinopril  40 mg Oral Daily  . simvastatin  20 mg Oral q1800   Continuous Infusions:  PRN Meds:.acetaminophen, food thickener Assessment/Plan: Active Problems:   Diabetes mellitus with mild nonproliferative diabetic retinopathy   Essential hypertension   Depression   Seizure disorder   UTI (lower urinary tract infection)   Altered mental status  UTI: Afebrile for almost 48 hours. No complaints of fever, chills, dysuria, nausea or suprapubic pain. Patient has completed 7 days of antibiotics.Patient's mental status has greatly improved from prior. Ongoing fever may have been secondary to lamictal as fever has resolved after discontinuation and mental status has greatly improved. ANA negative.  -Discharge to home  Interstitial Edema vs Pneumonitis on CXR: Mild crackles in left lower lung base suspicious for edema versus atelectasis. Patient denies any shortness of breath.SpO2 100% on RA. -Inspiratory Spirometry -Home PT  Hypokalemia:Resolved.  DM Type 2:Well controlled. Will restart home regimen on discharge.  -Lantus 5 units qHS -Continue SSI-S -CBGs ac/hs  HTN: 193/61 this morning. Nurse feels electronic cuff is not reading accurately. Patient recently had increase of lisinopril to 40 mg daily in addition to Coreg 25 mg BID. Patient was seen in the clinic for possible OH several months ago, but since then has had her BP medications slowly added back secondary to uncontrolled HTN.  -Continue Coreg 25 mg BID -Continue Lisinopril '40mg'$  daily -Consider amlodipine 5 mg daily if still hypertensive at follow up visit.   CKD 3: At baseline, creatinine 0.89 on discharge.  -Recheck BMET  as outpatient  Seizures:Continue Keppra; Lamictal has  been discontinued.  -Follow up with Neuro as outpatient  Dispo: Disposition is deferred at this time, awaiting improvement of current medical problems.  Anticipated discharge today.   The patient does have a current PCP Liberty Handy, MD) and does need an [facility] hospital follow-up appointment after discharge.  The patient does have transportation limitations that hinder transportation to clinic appointments.  .Services Needed at time of discharge: Y = Yes, Blank = No PT:   OT:   RN:   Equipment:   Other:     LOS: 6 days   Osa Craver, DO PGY-1 Internal Medicine Resident Pager # [phone] 10/12/2014 11:07 AM

## 2014-10-12 NOTE — Progress Notes (Signed)
PT Cancellation Note  Patient Details Name: [name] MRN: [id number] DOB: 1933-09-06   Cancelled Treatment:    Reason Eval/Treat Not Completed: Fatigue/lethargy limiting ability to participate;Patient declined, no reason specified.  PT made 3 attempts to see her and pt will not interact with PT.  Try later if time allows   Ramond Dial 10/12/2014, 1:15 PM   Mee Hives, PT MS Acute Rehab Dept. Number: [phone] [id number] and [name] [phone]

## 2014-10-12 NOTE — Discharge Instructions (Signed)
Thank you for allowing [place] to be involved in your healthcare while you were hospitalized at [facility].   Please note that there have been changes to your home medications.  --> PLEASE LOOK AT YOUR DISCHARGE MEDICATION LIST FOR DETAILS.   Please call your PCP if you have any questions or concerns, or any difficulty getting any of your medications.  Please return to the ER if you have worsening of your symptoms or new severe symptoms arise.  PLEASE DO NOT TAKE YOUR LAMICTAL.   Dysphagia Level 1 Diet, Pureed The dysphasia level 1 diet includes foods that are completely pureed and smooth. The foods have a pudding-like texture, such as the texture of pureed pancakes, mashed potatoes, and yogurt. The diet does not include foods with lumps or coarse textures. Liquids should be smooth and may either be thin, nectar-thick, honey-like, or spoon-thick. This diet is helpful for people with moderate to severe swallowing problems. It reduces the risk of food getting caught in the windpipe, trachea, or lungs. You may need help or supervision during meals while following this diet. WHAT DO I NEED TO KNOW ABOUT THIS DIET? Foods 3. You may eat foods that are soft and have a pudding-like texture. If a food does not have this texture, you may be able to eat the food after: 1. Pureeing it. This can be done with a blender or whisk. 2. Moistening it with liquid. For example, you may have bread if you soak it in milk or syrup. 4. Avoid foods that are hard, dry, sticky, chunky, lumpy, or stringy. Also avoid foods with nuts, seeds, raisins, skins, and pulp. 5. Do not eat foods that you have to chew. If you have to chew the food, then you cannot eat it. 6. Eat a variety of foods to get all the nutrients you need. Liquids  You may drink liquids that are smooth. Your health care provider will tell you if you should drink thin or thickened liquids.  To thicken a liquid, use a food and beverage  thickener or a thickening food. Thickened liquids are usually a "pudding-like" consistency.  Thin liquids include fruit juices, milk, coffee, tea, yogurts, shakes, and similar foods that melt to thin liquid at room temperature.  Avoid liquids with seeds, pulp, or chunks. See your dietitian or health care provider regularly for help with your dietary changes. WHAT FOODS CAN I EAT? Grains Store-bought soft breads, pancakes, and [place] toast that have a smooth, moist texture and do not have nuts or seeds (you will need to moisten the food with liquid). Cooked cereals that have a pudding-like consistency, such as cream of wheat or farina (no oatmeal). Pureed, well-cooked pasta, rice, and plain bread stuffing. Vegetables Pureed vegetables. Soft avocado. Smooth tomato paste or sauce. Strained or pureed soups (these may need to be thickened as directed). Mashed or pureed potatoes without skin (can be seasoned with butter, smooth gravy, margarine, or sour cream). Fruits Pureed fruits such as melons and apples without seeds or pulp. Mashed bananas. Smooth tomato paste or sauce. Fruit juices without pulp or seeds. Strained or pureed soups. Meat and Other Protein Sources Pureed meat. Smooth pate or liverwurst. Smooth souffles. Pureed beans (such as lentils). Pureed eggs. Dairy Yogurt. Smooth cheese sauces. Milk (may need to be thickened). Nutritional dairy drinks or shakes. Ask your health care provider whether you can have ice cream. Condiments Finely ground salt, pepper, and other ground spices. Sweets/Desserts Smooth puddings and custards. Pureed desserts. Souffles.  Whipped topping. Ask your health care provider whether you can have frozen desserts. Fats and Oils Butter. Margarine. Smooth and strained gravy. Sour cream. Mayonnaise. Cream cheese. Whipped topping. Smooth sauces (such as white sauce, cheese sauce, or hollandaise sauce). The items listed above may not be a complete list of recommended  foods or beverages. Contact your dietitian for more options. WHAT FOODS ARE NOT RECOMMENDED? Grains Oatmeal. Dry cereals. Hard breads. Vegetables Whole vegetables. Stringy vegetables (such as celery). Thin tomato sauce. Fruits Whole fresh, frozen, canned, or dried fruits that have not been pureed. Stringy fruits (such as pineapple). Meat and Other Protein Sources Whole or ground meat, fish, or poultry. Dried or cooked lentils or legumes that have been cooked but not mashed or pureed. Non-pureed eggs. Nuts and seeds. Peanut butter. Dairy Non-pureed cheese. Dairy products with lumps or chunks. Ask your health care provider whether you can have ice cream. Condiments Coarse or seeded herbs and spices. Sweets/Desserts [place] preserves. Jams with seeds. Solid desserts. Sticky, chewy sweets (such as licorice and caramel). Ask your health care provider whether you can have frozen desserts. Fats and Oils Sauces of fats with lumps or chunks. The items listed above may not be a complete list of foods and beverages to avoid. Contact your dietitian for more information. Document Released: 02/02/2005 Document Revised: 06/19/2013 Document Reviewed: 01/16/2013 [facility] Patient Information 2015 [place], [place]. This information is not intended to replace advice given to you by your health care provider. Make sure you discuss any questions you have with your health care provider.

## 2014-10-12 NOTE — ED Provider Notes (Signed)
CSN: [id number]     Arrival date & time 10/12/14  2039 History   First MD Initiated Contact with Patient 10/12/14 2055     Chief Complaint  Patient presents with  . Fall   Patient is a 81 y.o. female presenting with general illness. The history is provided by a relative. The history is limited by the condition of the patient. No language interpreter was used.  Illness Location:  NA Quality:  Syncope Severity:  Moderate Onset quality:  Sudden Timing:  Unable to specify Progression:  Unable to specify Chronicity:  New Context:  81 year old female with past medical history of diabetes hypertension previous CVA with no known persistent deficits at baseline, CHF, and grade 2 meningioma of the brain status post resection in 2014 as well as adenocarcinoma of the lung status post resection of unknown date. Patient presenting with unwitnessed fall at home. Patient has mild to moderate dementia at baseline and is normally alert and oriented 3. Patient presenting by EMS without c-collar or backboard in place. No pain   Past Medical History  Diagnosis Date  . Diabetes mellitus 2007    HgA1C (02/20/2010) = 9.2, HgA1C (03/20/2009) = 12.1  . CVA (cerebrovascular accident) 0539     right embolic stroke, no residual deficits  . Diverticulitis   . CVA (cerebral infarction) 7-yrs ago  . Arthritis   . Dysphagia   . CHF (congestive heart failure)   . DIABETES MELLITUS, TYPE II 12/04/2005  . HYPERLIPIDEMIA 12/04/2005  . HYPERTENSION 12/04/2005  . GERD 12/04/2005  . ARTHRITIS, KNEE 03/24/2006  . Lung mass 07/08/2010  . Meningioma 07/21/2010  . Hemangioma of liver 12/02/2010  . Depression 12/02/2010  . Adenocarcinoma of lung      Right upper lobe adenocarcinoma. s/p right lower lobectomy 12/15/10  . Brain cancer     7 cm cerebral right atypical meningoma grade II   Past Surgical History  Procedure Laterality Date  . Abdominal hysterectomy    . Video bronchoscope.  12/29/2007    [place]  . Wide  excision of left upper back mass.    . Extracapsular cataract extraction with intraocular      lens implantation.  . Right vats,right thoracotomy,right lower lobectomy with node dissection    . Incision and drainage perirectal abscess N/A 01/19/2013    Procedure: IRRIGATION AND DEBRIDEMENT PERIRECTAL ABSCESS;  Surgeon: Zenovia Jarred, MD;  Location: [place];  Service: General;  Laterality: N/A;  . Craniotomy N/A 03/02/2013    Procedure: CRANIOTOMY TUMOR EXCISION;  Surgeon: Winfield Cunas, MD;  Location: [place] [facility];  Service: Neurosurgery;  Laterality: N/A;  Bifrontal Craniotomy for tumor   Family History  Problem Relation Age of Onset  . Hyperlipidemia Brother   . Hypertension Brother   . Diabetes Brother    Social History  Substance Use Topics  . Smoking status: Former Smoker    Types: Cigarettes    Quit date: 02/17/2000  . Smokeless tobacco: Never Used  . Alcohol Use: No   OB History    No data available      Review of Systems  Unable to perform ROS: Dementia    Allergies  Review of patient's allergies indicates no known allergies.  Home Medications   Prior to Admission medications   Medication Sig Start Date End Date Taking? Authorizing Provider  aspirin EC 81 MG tablet Take 81 mg by mouth daily.   Yes Historical Provider, MD  carvedilol (COREG) 25 MG tablet Take 1 tablet (25  mg total) by mouth 2 (two) times daily with a meal. 03/06/14  Yes Langley Gauss Moding, MD  chlorhexidine (PERIDEX) 0.12 % solution Use as directed 15 mLs in the mouth or throat 2 (two) times daily.   Yes Historical Provider, MD  feeding supplement, GLUCERNA SHAKE, (GLUCERNA SHAKE) LIQD Take 237 mLs by mouth 2 (two) times daily between meals.   Yes Historical Provider, MD  FLUoxetine (PROZAC) 10 MG capsule TAKE 1 CAPSULE (10 MG TOTAL) BY MOUTH AT BEDTIME. 06/12/14  Yes Wilber Oliphant, MD  insulin aspart (NOVOLOG FLEXPEN) 100 UNIT/ML FlexPen Novolog Insulin: 91 - 160 5 units 161- 230 7  units 231-300 9 units 301- 370 11 units 371 - 440 13 units 441 - 510 15 units 02/01/14  Yes Wilber Oliphant, MD  Insulin Glargine (LANTUS SOLOSTAR) 100 UNIT/ML Solostar Pen Inject 17 Units into the skin at bedtime. Patient taking differently: Inject 5 Units into the skin daily at 10 pm.  07/02/14  Yes Wilber Oliphant, MD  levETIRAcetam (KEPPRA) 750 MG tablet Take 1 tablet (750 mg total) by mouth 2 (two) times daily. 02/07/14  Yes Oval Linsey, MD  simvastatin (ZOCOR) 20 MG tablet Take 20 mg by mouth daily at 6 PM.    Yes Historical Provider, MD  acetaminophen-codeine (TYLENOL #3) 300-30 MG per tablet Take 1 tablet by mouth every 6 (six) hours as needed for moderate pain. 10/05/14   Milagros Loll, MD  diclofenac sodium (VOLTAREN) 1 % GEL Apply 4 g topically 4 (four) times daily. Patient taking differently: Apply 4 g topically 4 (four) times daily as needed (pain).  07/30/14   Milagros Loll, MD  FERREX 150 150 MG capsule TAKE 1 CAPSULE BY MOUTH DAILY. 05/21/14   Wilber Oliphant, MD  glucose blood (ACCU-CHEK AVIVA) test strip Use to Check Blood Sugars 3 Times Daily.Dx Code: 250.00. Insulin dependent 11/09/13   Annia Belt, MD  lisinopril (PRINIVIL,ZESTRIL) 40 MG tablet Take 1 tablet (40 mg total) by mouth daily. 10/12/14   Alexa Sherral Hammers, MD  omeprazole (PRILOSEC) 20 MG capsule Take 1 capsule (20 mg total) by mouth daily. 07/30/14   Milagros Loll, MD   BP 205/62 mmHg  Pulse 80  Temp(Src) 98.4 F (36.9 C) (Oral)  Resp 24  Ht '5\' 4"'$  (1.626 m)  Wt 200 lb (90.719 kg)  BMI 34.31 kg/m2  SpO2 86%   Physical Exam  Constitutional: She is oriented to person, place, and time. No distress.  HENT:  Head: Normocephalic and atraumatic.  Eyes: Conjunctivae are normal. Pupils are equal, round, and reactive to light.  Neck: Normal range of motion. Neck supple.  Cardiovascular: Normal rate, regular rhythm and normal heart sounds.   Pulmonary/Chest: Effort normal and breath sounds  normal.  Abdominal: Soft. Bowel sounds are normal. She exhibits no distension. There is no tenderness.  Musculoskeletal: Normal range of motion.  Neurological: She is alert and oriented to person, place, and time.  Skin: She is not diaphoretic.  Nursing note and vitals reviewed.   ED Course  Procedures   Labs Review Labs Reviewed  URINALYSIS, ROUTINE W REFLEX MICROSCOPIC (NOT AT [facility]) - Abnormal; Notable for the following:    APPearance CLOUDY (*)    Ketones, ur 15 (*)    Protein, ur 30 (*)    All other components within normal limits  URINE MICROSCOPIC-ADD ON - Abnormal; Notable for the following:    Squamous Epithelial / LPF MANY (*)    Bacteria,  UA FEW (*)    Casts HYALINE CASTS (*)    All other components within normal limits  URINE CULTURE    Imaging Review Ct Head Wo Contrast  10/12/2014   CLINICAL DATA:  Unwitnessed fall at home.  EXAM: CT HEAD WITHOUT CONTRAST  CT CERVICAL SPINE WITHOUT CONTRAST  TECHNIQUE: Multidetector CT imaging of the head and cervical spine was performed following the standard protocol without intravenous contrast. Multiplanar CT image reconstructions of the cervical spine were also generated.  COMPARISON:  CT head 10/06/2014.  FINDINGS: CT HEAD FINDINGS  Postoperative changes with a right frontal craniotomy. Underlying encephalomalacia. Diffuse cerebral atrophy. Ventricular dilatation consistent with central atrophy. Focal encephalomalacia in the right internal capsule consistent with old infarct. Patchy low-attenuation changes in the deep white matter consistent with small vessel ischemia. No mass effect or midline shift. No abnormal extra-axial fluid collections. Gray-white matter junctions are distinct. Basal cisterns are not effaced. No evidence of acute intracranial hemorrhage. No depressed skull fractures. Mucosal thickening in the paranasal sinuses. Mastoid air cells are not opacified. Vascular calcifications.  CT CERVICAL SPINE FINDINGS  Reversal of  the usual cervical lordosis. This may be due to patient positioning but ligamentous injury or muscle spasm could also have this appearance. No anterior subluxation. Degenerative changes in the cervical spine with narrowed interspaces and associated endplate hypertrophic changes. Degenerative changes throughout the cervical facet joints. No vertebral compression deformities. No prevertebral soft tissue swelling. Focal cystic lesions likely representing degenerative cysts. C1-2 articulation appears intact. Bilateral pleural effusions.  IMPRESSION: No acute intracranial abnormalities. Postoperative changes with right frontal craniotomy and underlying encephalomalacia. Chronic atrophy and small vessel ischemic changes. Infarct in the right internal capsule.  Nonspecific reversal of the usual cervical lordosis. Diffuse degenerative changes throughout the cervical spine. No acute displaced fractures identified. Incidental note of bilateral pleural effusions in the apices.   Electronically Signed   By: Lucienne Capers M.D.   On: 10/12/2014 23:21   Ct Cervical Spine Wo Contrast  10/12/2014   CLINICAL DATA:  Unwitnessed fall at home.  EXAM: CT HEAD WITHOUT CONTRAST  CT CERVICAL SPINE WITHOUT CONTRAST  TECHNIQUE: Multidetector CT imaging of the head and cervical spine was performed following the standard protocol without intravenous contrast. Multiplanar CT image reconstructions of the cervical spine were also generated.  COMPARISON:  CT head 10/06/2014.  FINDINGS: CT HEAD FINDINGS  Postoperative changes with a right frontal craniotomy. Underlying encephalomalacia. Diffuse cerebral atrophy. Ventricular dilatation consistent with central atrophy. Focal encephalomalacia in the right internal capsule consistent with old infarct. Patchy low-attenuation changes in the deep white matter consistent with small vessel ischemia. No mass effect or midline shift. No abnormal extra-axial fluid collections. Gray-white matter junctions  are distinct. Basal cisterns are not effaced. No evidence of acute intracranial hemorrhage. No depressed skull fractures. Mucosal thickening in the paranasal sinuses. Mastoid air cells are not opacified. Vascular calcifications.  CT CERVICAL SPINE FINDINGS  Reversal of the usual cervical lordosis. This may be due to patient positioning but ligamentous injury or muscle spasm could also have this appearance. No anterior subluxation. Degenerative changes in the cervical spine with narrowed interspaces and associated endplate hypertrophic changes. Degenerative changes throughout the cervical facet joints. No vertebral compression deformities. No prevertebral soft tissue swelling. Focal cystic lesions likely representing degenerative cysts. C1-2 articulation appears intact. Bilateral pleural effusions.  IMPRESSION: No acute intracranial abnormalities. Postoperative changes with right frontal craniotomy and underlying encephalomalacia. Chronic atrophy and small vessel ischemic changes. Infarct in the right internal  capsule.  Nonspecific reversal of the usual cervical lordosis. Diffuse degenerative changes throughout the cervical spine. No acute displaced fractures identified. Incidental note of bilateral pleural effusions in the apices.   Electronically Signed   By: Lucienne Capers M.D.   On: 10/12/2014 23:21   I have personally reviewed and evaluated these images and lab results as part of my medical decision-making.   EKG Interpretation   Date/Time:  Friday October 12 2014 21:36:01 EDT Ventricular Rate:  78 PR Interval:  150 QRS Duration: 138 QT Interval:  487 QTC Calculation: 555 R Axis:   -39 Text Interpretation:  Sinus rhythm Left bundle branch block Left bundle  branch block No significant change since last tracing Confirmed by  Gerald Leitz ([id number]) on 10/12/2014 9:50:05 PM      MDM  Patient is a 81 year old female with past medical history of diabetes hypertension previous CVA with no known  persistent deficits at baseline, CHF, and grade 2 meningioma of the brain status post resection in 2014 as well as adenocarcinoma of the lung status post resection of unknown date. Patient presenting with unwitnessed fall at home. Patient has mild to moderate dementia at baseline and is normally alert and oriented 3. Patient presenting by EMS without c-collar or backboard in place. Patient not complaining of any pain at this time however she does not appear to be a reliable historian.  Exam above notable for elderly female sitting up in the stretcher in no acute distress. Patient afebrile. Not tachycardic. Hypertensive with systolic blood pressures 093G to 190s with known history of hypertension. Cardio vascular exam showing regular and rhythm. Abdominal exam without distention and without tenderness. Patient is alert and oriented 3 but is unsure why she is in the emergency department. Patient has tenderness of left hip and left thigh. Limited range of motion secondary to pain. Neurovascular intact distally.  EKG showing no acute change. CT head showing no acute intracranial laterality. CT cervical spine show no acute fracture or malalignment. Chest x-ray obtained showing no acute cardiopulmonary process. X-ray of the pelvis and femur are pending at this time - will be followed up per hopsitalist which was relayed in sign-out. Basic labs and urine were drawn.  Given the fact that patient was recently admitted to the hospital for altered mental status and fall in the setting of urinary tract infection and was just discharged home hours prior, patient will be admitted to the hospital service for further evaluation and management of syncope of unknown origin.  Patient's family understands and agrees with the plan and has no further questions or concerns time.  Patient care discussed with and followed by my attending Dr. Thomasene Lot  Final diagnoses:  Fall  Syncope, unspecified syncope type    Mayer Camel, MD 10/13/14 0019  Courteney Julio Alm, MD 10/13/14 1829

## 2014-10-12 NOTE — Progress Notes (Signed)
  Date: 10/12/2014  Patient name: [name]  Medical record number: [id number]  Date of birth: 11/23/1933   This patient has been seen and the plan of care was discussed with the house staff. Please see their note for complete details. I concur with their findings with the following additions/corrections: Much more alert and perky today. Afebrile 48 hrs. Stable for D/C home. [place] F/U.   Bartholomew Crews, MD 10/12/2014, 12:35 PM

## 2014-10-12 NOTE — Progress Notes (Signed)
Patient swallowed a little of her Levaquin dose with apple sauce. Refused the Keppra tab. Notified on-call MD and Pharmacy. Pharmacy sent a dose of Keppra  in liquid form and patient successfully swalowed  It. Patient encouraged to swalow Levaquin tab but refused. On-call MD notified again with high BP and refusal to swallow crushed  pills. MD ordered IV Levaquin but IV was infiltrated. Will continue to monitor.

## 2014-10-12 NOTE — ED Notes (Signed)
Portable xray called

## 2014-10-12 NOTE — ED Notes (Signed)
Called xray, patient is currently next for transport.

## 2014-10-12 NOTE — Progress Notes (Signed)
Subjective: [name] was evaluated this morning.  She was sitting up in bed eating breakfast and watching TV.  This is the most alert I have seen her during her hospital stay.  Family was not in the room this morning as they usually are.  Patient states she is feeling well enough to go home.  Patient has some LUQ pain only with movement that started this morning.  Patient denies SOB, chest pain, abdominal pain, diarrhea, or peripheral edema.  Objective: Vital signs in last 24 hours: Filed Vitals:   10/11/14 1846 10/11/14 2018 10/12/14 0100 10/12/14 0704  BP: 142/78 193/61 180/60 180/78  Pulse:  83  81  Temp:  98.9 F (37.2 C)  98.9 F (37.2 C)  TempSrc:  Oral  Oral  Resp:  18  18  Height:      Weight:      SpO2:  100%  96%   Weight change:  No intake or output data in the 24 hours ending 10/12/14 0836  General: NAD, sitting up in bed HEENT: PERRL, EOMI, no scleral icterus Cardiac: RRR, no rubs, murmurs or gallops Pulm: clear to auscultation bilaterally, moving normal volumes of air Abd: soft, nontender, nondistended, BS present Ext: warm and well perfused, no pedal edema MSK:  Pain in the LUQ under rib cage  Neuro: alert and oriented X3  Lab Results: Lab Results  Component Value Date   WBC 10.2 10/12/2014   HGB 8.3* 10/12/2014   HCT 27.6* 10/12/2014   MCV 80.9 10/12/2014   PLT 362 10/12/2014   BMP Latest Ref Rng 10/12/2014 10/11/2014 10/10/2014  Glucose 65 - 99 mg/dL 117(H) 107(H) 91  BUN 6 - 20 mg/dL '14 12 12  '$ Creatinine 0.44 - 1.00 mg/dL 0.89 1.00 1.10(H)  Sodium 135 - 145 mmol/L 149(H) 143 144  Potassium 3.5 - 5.1 mmol/L 3.7 3.4(L) 3.0(L)  Chloride 101 - 111 mmol/L 114(H) 110 109  CO2 22 - 32 mmol/L '26 25 25  '$ Calcium 8.9 - 10.3 mg/dL 8.2(L) 8.0(L) 8.1(L)    Micro Results: Recent Results (from the past 240 hour(s))  Urine culture     Status: None   Collection Time: 10/06/14  1:49 AM  Result Value Ref Range Status   Specimen Description URINE, CATHETERIZED   Final   Special Requests NONE  Final   Culture MULTIPLE SPECIES PRESENT, SUGGEST RECOLLECTION  Final   Report Status 10/07/2014 FINAL  Final  Culture, blood (routine x 2)     Status: None   Collection Time: 10/06/14  8:00 PM  Result Value Ref Range Status   Specimen Description BLOOD RIGHT ANTECUBITAL  Final   Special Requests BOTTLES DRAWN AEROBIC AND ANAEROBIC 10CC  Final   Culture NO GROWTH 5 DAYS  Final   Report Status 10/11/2014 FINAL  Final  Culture, blood (routine x 2)     Status: None   Collection Time: 10/06/14  8:10 PM  Result Value Ref Range Status   Specimen Description BLOOD RIGHT HAND  Final   Special Requests [place]  Final   Culture NO GROWTH 5 DAYS  Final   Report Status 10/11/2014 FINAL  Final  MRSA PCR Screening     Status: None   Collection Time: 10/08/14 10:37 AM  Result Value Ref Range Status   MRSA by PCR NEGATIVE NEGATIVE Final    Comment:        The GeneXpert MRSA Assay (FDA approved for NASAL specimens only), is one component of a comprehensive MRSA colonization surveillance  program. It is not intended to diagnose MRSA infection nor to guide or monitor treatment for MRSA infections.   Culture, Urine     Status: None   Collection Time: 10/08/14  1:52 PM  Result Value Ref Range Status   Specimen Description URINE, RANDOM  Final   Special Requests NONE  Final   Culture NO GROWTH 1 DAY  Final   Report Status 10/09/2014 FINAL  Final   Studies/Results: Dg Chest 2 View  10/10/2014   CLINICAL DATA:  Fever for 1 day.  EXAM: CHEST  2 VIEW  COMPARISON:  10/08/2014  FINDINGS: Increased small bilateral pleural effusions are seen, with bibasilar atelectasis, left side greater than right. Mild cardiomegaly and diffuse interstitial edema or pneumonitis show no significant change.  IMPRESSION: No significant change in cardiomegaly and diffuse interstitial edema or pneumonitis.  Increased small bilateral pleural effusions and bibasilar atelectasis.   Electronically  Signed   By: Earle Gell M.D.   On: 10/10/2014 21:13   Medications: I have reviewed the patient's current medications. Scheduled Meds: . antiseptic oral rinse  7 mL Mouth Rinse q12n4p  . aspirin EC  81 mg Oral Daily  . carvedilol  25 mg Oral BID WC  . chlorhexidine  15 mL Mouth Rinse BID  . enoxaparin (LOVENOX) injection  40 mg Subcutaneous Daily  . feeding supplement (GLUCERNA SHAKE)  237 mL Oral TID BM  . insulin aspart  0-5 Units Subcutaneous QHS  . insulin aspart  0-9 Units Subcutaneous TID WC  . insulin glargine  5 Units Subcutaneous QHS  . levETIRAcetam  750 mg Oral BID  . levofloxacin (LEVAQUIN) IV  250 mg Intravenous Once  . levofloxacin  250 mg Oral QHS  . lisinopril  40 mg Oral Daily  . simvastatin  20 mg Oral q1800   Continuous Infusions:  PRN Meds:.acetaminophen, food thickener Assessment/Plan: Fever: WBC has resolved and currently 10.2 Patient is afebrile for over 24 hrs .Both beta lactams and anti-seizure medications were stopped and with immediate improvement of fevers this was likely a drug fever. ANA was pending yesterday and is negative.  Daughter was contacted and given an update about the status of her mother.  Daughter states that she is able to take her today and continue care.  She also wanted [facility] to continue making house visits as they had just recently stopped.  - Vitals and CBC stable, anticipate discharge - [facility] - With patient on 7 days of antibiotics no need to continue on discharge     Hypokalemia: Currently K 3.7  Seizure disorder  -Continue Keppra  - Follow up neurology for med changes done in hospital  Essential hypertension: mild hypertension  -Continue Coreg '25mg'$  BID and Lisinopril to '40mg'$  daily  DM type II: Current BS 117  - Continue 5 units Lantus qHS - continue SSI-S  This is a Careers information officer Note.  The care of the patient was discussed with Dr. Marvel Plan and the  assessment and plan formulated with their assistance.  Please see their attached note for official documentation of the daily encounter.   LOS: 6 days   Valinda Party, Med Student 10/12/2014, 8:36 AM

## 2014-10-12 NOTE — Care Management Important Message (Signed)
Important Message  Patient Details  Name: [name] MRN: [id number] Date of Birth: 1933/11/28   Medicare Important Message Given:  Yes-second notification given    Ninfa Meeker, RN 10/12/2014, 10:52 AM

## 2014-10-12 NOTE — ED Notes (Signed)
MD at the bedside  

## 2014-10-13 ENCOUNTER — Inpatient Hospital Stay (HOSPITAL_COMMUNITY): Payer: Medicare Other

## 2014-10-13 ENCOUNTER — Encounter (HOSPITAL_COMMUNITY): Payer: Self-pay | Admitting: General Practice

## 2014-10-13 DIAGNOSIS — R55 Syncope and collapse: Secondary | ICD-10-CM

## 2014-10-13 LAB — COMPREHENSIVE METABOLIC PANEL
ALK PHOS: 60 U/L (ref 38–126)
ALT: 17 U/L (ref 14–54)
ANION GAP: 7 (ref 5–15)
AST: 22 U/L (ref 15–41)
Albumin: 2.5 g/dL — ABNORMAL LOW (ref 3.5–5.0)
BILIRUBIN TOTAL: 0.3 mg/dL (ref 0.3–1.2)
BUN: 11 mg/dL (ref 6–20)
CALCIUM: 8.3 mg/dL — AB (ref 8.9–10.3)
CO2: 29 mmol/L (ref 22–32)
Chloride: 110 mmol/L (ref 101–111)
Creatinine, Ser: 0.83 mg/dL (ref 0.44–1.00)
Glucose, Bld: 122 mg/dL — ABNORMAL HIGH (ref 65–99)
Potassium: 3.1 mmol/L — ABNORMAL LOW (ref 3.5–5.1)
Sodium: 146 mmol/L — ABNORMAL HIGH (ref 135–145)
TOTAL PROTEIN: 6.2 g/dL — AB (ref 6.5–8.1)

## 2014-10-13 LAB — GLUCOSE, CAPILLARY
GLUCOSE-CAPILLARY: 110 mg/dL — AB (ref 65–99)
GLUCOSE-CAPILLARY: 152 mg/dL — AB (ref 65–99)
Glucose-Capillary: 126 mg/dL — ABNORMAL HIGH (ref 65–99)
Glucose-Capillary: 124 mg/dL — ABNORMAL HIGH (ref 65–99)
Glucose-Capillary: 125 mg/dL — ABNORMAL HIGH (ref 65–99)

## 2014-10-13 LAB — CBC WITH DIFFERENTIAL/PLATELET
BASOS ABS: 0 10*3/uL (ref 0.0–0.1)
Basophils Relative: 0 % (ref 0–1)
EOS ABS: 0.1 10*3/uL (ref 0.0–0.7)
EOS PCT: 2 % (ref 0–5)
HCT: 28.2 % — ABNORMAL LOW (ref 36.0–46.0)
Hemoglobin: 8.5 g/dL — ABNORMAL LOW (ref 12.0–15.0)
Lymphocytes Relative: 28 % (ref 12–46)
Lymphs Abs: 2.3 10*3/uL (ref 0.7–4.0)
MCH: 24 pg — AB (ref 26.0–34.0)
MCHC: 30.1 g/dL (ref 30.0–36.0)
MCV: 79.7 fL (ref 78.0–100.0)
MONO ABS: 1.3 10*3/uL — AB (ref 0.1–1.0)
Monocytes Relative: 15 % — ABNORMAL HIGH (ref 3–12)
Neutro Abs: 4.6 10*3/uL (ref 1.7–7.7)
Neutrophils Relative %: 55 % (ref 43–77)
PLATELETS: 395 10*3/uL (ref 150–400)
RBC: 3.54 MIL/uL — AB (ref 3.87–5.11)
RDW: 16.8 % — AB (ref 11.5–15.5)
WBC: 8.3 10*3/uL (ref 4.0–10.5)

## 2014-10-13 LAB — BASIC METABOLIC PANEL
ANION GAP: 9 (ref 5–15)
BUN: 8 mg/dL (ref 6–20)
CALCIUM: 8.2 mg/dL — AB (ref 8.9–10.3)
CO2: 29 mmol/L (ref 22–32)
Chloride: 107 mmol/L (ref 101–111)
Creatinine, Ser: 0.82 mg/dL (ref 0.44–1.00)
GFR calc Af Amer: >60 mL/min (ref >60)
GLUCOSE: 131 mg/dL — AB (ref 65–99)
Potassium: 3 mmol/L — ABNORMAL LOW (ref 3.5–5.1)
Sodium: 145 mmol/L (ref 135–145)

## 2014-10-13 LAB — PHOSPHORUS: PHOSPHORUS: 2.2 mg/dL — AB (ref 2.5–4.6)

## 2014-10-13 LAB — TROPONIN I
Troponin I: <0.03 ng/mL (ref <0.031)
Troponin I: <0.03 ng/mL (ref <0.031)

## 2014-10-13 LAB — MAGNESIUM: Magnesium: 2 mg/dL (ref 1.7–2.4)

## 2014-10-13 MED ORDER — INSULIN GLARGINE 100 UNIT/ML SOLOSTAR PEN: 5.0000 [IU] | PEN_INJECTOR | Freq: Every day | SUBCUTANEOUS | Status: DC

## 2014-10-13 MED ORDER — POTASSIUM CHLORIDE CRYS ER 20 MEQ PO TBCR
40.0000 meq | EXTENDED_RELEASE_TABLET | Freq: Once | ORAL | Status: AC
  Administered 2014-10-13: 40 meq via ORAL
  Filled 2014-10-13: qty 2

## 2014-10-13 MED ORDER — ASPIRIN EC 81 MG PO TBEC
81.0000 mg | DELAYED_RELEASE_TABLET | Freq: Every day | ORAL | Status: DC
  Administered 2014-10-13 – 2014-10-15 (×3): 81 mg via ORAL
  Filled 2014-10-13 (×3): qty 1

## 2014-10-13 MED ORDER — FLUOXETINE HCL 10 MG PO CAPS
10.0000 mg | ORAL_CAPSULE | Freq: Every day | ORAL | Status: DC
  Administered 2014-10-13: 10 mg via ORAL
  Filled 2014-10-13 (×3): qty 1

## 2014-10-13 MED ORDER — AMLODIPINE BESYLATE 5 MG PO TABS
5.0000 mg | ORAL_TABLET | Freq: Every day | ORAL | Status: DC
  Administered 2014-10-14 – 2014-10-15 (×2): 5 mg via ORAL
  Filled 2014-10-13 (×2): qty 1

## 2014-10-13 MED ORDER — ONDANSETRON HCL 4 MG PO TABS: 4.0000 mg | ORAL_TABLET | Freq: Four times a day (QID) | ORAL | Status: DC | PRN

## 2014-10-13 MED ORDER — CHLORHEXIDINE GLUCONATE 0.12 % MT SOLN
15.0000 mL | Freq: Two times a day (BID) | OROMUCOSAL | Status: DC
  Administered 2014-10-13 – 2014-10-15 (×4): 15 mL via OROMUCOSAL
  Filled 2014-10-13 (×5): qty 15

## 2014-10-13 MED ORDER — POLYSACCHARIDE IRON COMPLEX 150 MG PO CAPS
150.0000 mg | ORAL_CAPSULE | Freq: Every day | ORAL | Status: DC
  Administered 2014-10-13 – 2014-10-15 (×3): 150 mg via ORAL
  Filled 2014-10-13 (×3): qty 1

## 2014-10-13 MED ORDER — SIMVASTATIN 20 MG PO TABS
20.0000 mg | ORAL_TABLET | Freq: Every day | ORAL | Status: DC
  Administered 2014-10-13 – 2014-10-14 (×2): 20 mg via ORAL
  Filled 2014-10-13 (×2): qty 1

## 2014-10-13 MED ORDER — LISINOPRIL 40 MG PO TABS
40.0000 mg | ORAL_TABLET | Freq: Every day | ORAL | Status: DC
  Administered 2014-10-13 – 2014-10-15 (×3): 40 mg via ORAL
  Filled 2014-10-13 (×2): qty 1
  Filled 2014-10-13 (×2): qty 2

## 2014-10-13 MED ORDER — SODIUM CHLORIDE 0.9 % IJ SOLN
3.0000 mL | Freq: Two times a day (BID) | INTRAMUSCULAR | Status: DC
  Administered 2014-10-13 – 2014-10-15 (×6): 3 mL via INTRAVENOUS

## 2014-10-13 MED ORDER — INSULIN GLARGINE 100 UNIT/ML ~~LOC~~ SOLN
5.0000 [IU] | Freq: Every day | SUBCUTANEOUS | Status: DC
  Administered 2014-10-13 – 2014-10-14 (×2): 5 [IU] via SUBCUTANEOUS
  Filled 2014-10-13 (×3): qty 0.05

## 2014-10-13 MED ORDER — PANTOPRAZOLE SODIUM 40 MG PO TBEC
40.0000 mg | DELAYED_RELEASE_TABLET | Freq: Every day | ORAL | Status: DC
  Administered 2014-10-13 – 2014-10-15 (×3): 40 mg via ORAL
  Filled 2014-10-13 (×4): qty 1

## 2014-10-13 MED ORDER — ENOXAPARIN SODIUM 40 MG/0.4ML ~~LOC~~ SOLN
40.0000 mg | SUBCUTANEOUS | Status: DC
  Administered 2014-10-13 – 2014-10-15 (×3): 40 mg via SUBCUTANEOUS
  Filled 2014-10-13 (×3): qty 0.4

## 2014-10-13 MED ORDER — GLUCERNA SHAKE PO LIQD
237.0000 mL | Freq: Two times a day (BID) | ORAL | Status: DC
  Administered 2014-10-13 – 2014-10-15 (×5): 237 mL via ORAL

## 2014-10-13 MED ORDER — DICLOFENAC SODIUM 1 % TD GEL: 4.0000 g | Freq: Four times a day (QID) | TRANSDERMAL | Status: DC | PRN

## 2014-10-13 MED ORDER — METOPROLOL TARTRATE 1 MG/ML IV SOLN
5.0000 mg | Freq: Once | INTRAVENOUS | Status: AC
  Administered 2014-10-13: 5 mg via INTRAVENOUS
  Filled 2014-10-13: qty 5

## 2014-10-13 MED ORDER — CARVEDILOL 25 MG PO TABS
25.0000 mg | ORAL_TABLET | Freq: Two times a day (BID) | ORAL | Status: DC
  Administered 2014-10-13 – 2014-10-15 (×5): 25 mg via ORAL
  Filled 2014-10-13 (×5): qty 1

## 2014-10-13 MED ORDER — LEVETIRACETAM 750 MG PO TABS
750.0000 mg | ORAL_TABLET | Freq: Two times a day (BID) | ORAL | Status: DC
  Administered 2014-10-13 – 2014-10-15 (×5): 750 mg via ORAL
  Filled 2014-10-13 (×5): qty 1

## 2014-10-13 MED ORDER — AMLODIPINE BESYLATE 5 MG PO TABS
5.0000 mg | ORAL_TABLET | Freq: Every day | ORAL | Status: DC
  Administered 2014-10-13: 5 mg via ORAL
  Filled 2014-10-13: qty 1

## 2014-10-13 MED ORDER — ONDANSETRON HCL 4 MG/2ML IJ SOLN: 4.0000 mg | Freq: Four times a day (QID) | INTRAMUSCULAR | Status: DC | PRN

## 2014-10-13 NOTE — ED Notes (Signed)
Xray called

## 2014-10-13 NOTE — Progress Notes (Signed)
   10/13/14 0409  Vitals  Temp 98 F (36.7 C)  Temp Source Oral  BP (!) 192/74 mmHg  BP Location Right Arm  BP Method Automatic  Patient Position (if appropriate) Lying  Pulse Rate 77  Pulse Rate Source Dinamap  Resp 20  Oxygen Therapy  SpO2 98 %  O2 Device Room Air  Pt's BP still elevated, Dr. Olevia Bowens made aware, no order at this time. Will monitor.

## 2014-10-13 NOTE — ED Notes (Signed)
Xray came to transport the patient, but iv team is now at the bedside.

## 2014-10-13 NOTE — ED Notes (Signed)
Spoke with Dr. Olevia Bowens in regards to patient's bp, 204/85, p 82 after '40mg'$  of lisinopril. MD acknowledges, no new orders as patient is in xray.

## 2014-10-13 NOTE — H&P (Signed)
[facility] History and Physical  [name] IWL:[id number] DOB: [birth year] DOA: 10/12/2014  Referring physician: Mayer Camel, MD  PCP: Liberty Handy, MD   Chief Complaint: Fall.  HPI: [name] is a 81 y.o. female with a past medical history of dementia, status post meningioma resection, CVA, type 2 diabetes, hyperlipidemia, hypertension, GERD, depression, lung cancer, status post right lower lobectomy in 2012 who was brought by her family members due to having a fall at home. Per family member, there was no tonic-clonic activity, no postictal-like, And no urinary incontinence. Patient was just recently discharged from the hospital due to a UTI. No further information at this time given the patient's clinical status and history of dementia.   Review of Systems:  Unable to obtain due to dementia.  Past Medical History  Diagnosis Date  . Diabetes mellitus 2007    HgA1C (02/20/2010) = 9.2, HgA1C (03/20/2009) = 12.1  . CVA (cerebrovascular accident) 1740     right embolic stroke, no residual deficits  . Diverticulitis   . CVA (cerebral infarction) 7-yrs ago  . Arthritis   . Dysphagia   . CHF (congestive heart failure)   . DIABETES MELLITUS, TYPE II 12/04/2005  . HYPERLIPIDEMIA 12/04/2005  . HYPERTENSION 12/04/2005  . GERD 12/04/2005  . ARTHRITIS, KNEE 03/24/2006  . Lung mass 07/08/2010  . Meningioma 07/21/2010  . Hemangioma of liver 12/02/2010  . Depression 12/02/2010  . Adenocarcinoma of lung      Right upper lobe adenocarcinoma. s/p right lower lobectomy 12/15/10  . Brain cancer     7 cm cerebral right atypical meningoma grade II   Past Surgical History  Procedure Laterality Date  . Abdominal hysterectomy    . Video bronchoscope.  12/29/2007    [place]  . Wide excision of left upper back mass.    . Extracapsular cataract extraction with intraocular      lens implantation.  . Right vats,right thoracotomy,right lower lobectomy with node dissection    .  Incision and drainage perirectal abscess N/A 01/19/2013    Procedure: IRRIGATION AND DEBRIDEMENT PERIRECTAL ABSCESS;  Surgeon: Zenovia Jarred, MD;  Location: [facility];  Service: General;  Laterality: N/A;  . Craniotomy N/A 03/02/2013    Procedure: CRANIOTOMY TUMOR EXCISION;  Surgeon: Winfield Cunas, MD;  Location: [facility];  Service: Neurosurgery;  Laterality: N/A;  Bifrontal Craniotomy for tumor   Social History:  reports that she quit smoking about 14 years ago. Her smoking use included Cigarettes. She has never used smokeless tobacco. She reports that she does not drink alcohol or use illicit drugs.  No Known Allergies  Family History  Problem Relation Age of Onset  . Hyperlipidemia Brother   . Hypertension Brother   . Diabetes Brother     Prior to Admission medications   Medication Sig Start Date End Date Taking? Authorizing Provider  aspirin EC 81 MG tablet Take 81 mg by mouth daily.   Yes Historical Provider, MD  carvedilol (COREG) 25 MG tablet Take 1 tablet (25 mg total) by mouth 2 (two) times daily with a meal. 03/06/14  Yes Langley Gauss Moding, MD  chlorhexidine (PERIDEX) 0.12 % solution Use as directed 15 mLs in the mouth or throat 2 (two) times daily.   Yes Historical Provider, MD  feeding supplement, GLUCERNA SHAKE, (GLUCERNA SHAKE) LIQD Take 237 mLs by mouth 2 (two) times daily between meals.   Yes Historical Provider, MD  FLUoxetine (PROZAC) 10 MG capsule TAKE 1 CAPSULE (10  MG TOTAL) BY MOUTH AT BEDTIME. 06/12/14  Yes Wilber Oliphant, MD  insulin aspart (NOVOLOG FLEXPEN) 100 UNIT/ML FlexPen Novolog Insulin: 91 - 160 5 units 161- 230 7 units 231-300 9 units 301- 370 11 units 371 - 440 13 units 441 - 510 15 units 02/01/14  Yes Wilber Oliphant, MD  Insulin Glargine (LANTUS SOLOSTAR) 100 UNIT/ML Solostar Pen Inject 17 Units into the skin at bedtime. Patient taking differently: Inject 5 Units into the skin daily at 10 pm.  07/02/14  Yes Wilber Oliphant, MD    levETIRAcetam (KEPPRA) 750 MG tablet Take 1 tablet (750 mg total) by mouth 2 (two) times daily. 02/07/14  Yes Oval Linsey, MD  simvastatin (ZOCOR) 20 MG tablet Take 20 mg by mouth daily at 6 PM.    Yes Historical Provider, MD  acetaminophen-codeine (TYLENOL #3) 300-30 MG per tablet Take 1 tablet by mouth every 6 (six) hours as needed for moderate pain. 10/05/14   Milagros Loll, MD  diclofenac sodium (VOLTAREN) 1 % GEL Apply 4 g topically 4 (four) times daily. Patient taking differently: Apply 4 g topically 4 (four) times daily as needed (pain).  07/30/14   Milagros Loll, MD  FERREX 150 150 MG capsule TAKE 1 CAPSULE BY MOUTH DAILY. 05/21/14   Wilber Oliphant, MD  glucose blood (ACCU-CHEK AVIVA) test strip Use to Check Blood Sugars 3 Times Daily.Dx Code: 250.00. Insulin dependent 11/09/13   Annia Belt, MD  lisinopril (PRINIVIL,ZESTRIL) 40 MG tablet Take 1 tablet (40 mg total) by mouth daily. 10/12/14   Alexa Sherral Hammers, MD  omeprazole (PRILOSEC) 20 MG capsule Take 1 capsule (20 mg total) by mouth daily. 07/30/14   Milagros Loll, MD   Physical Exam: Filed Vitals:   10/12/14 2350 10/13/14 0026 10/13/14 0030 10/13/14 0108  BP: 205/62 204/85 200/77 169/77  Pulse:  82 82 81  Temp:      TempSrc:      Resp:  '24 27 26  '$ Height:      Weight:      SpO2:  97% 96% 96%    Wt Readings from Last 3 Encounters:  10/12/14 90.719 kg (200 lb)  10/06/14 92.761 kg (204 lb 8 oz)  09/18/14 91.717 kg (202 lb 3.2 oz)    General:  Appears calm and comfortable Eyes: Positive anisocoria, normal lids, & conjunctiva ENT: grossly normal hearing, lips & tongue Neck: no LAD, masses or thyromegaly Cardiovascular: RRR, no m/r/g. No LE edema. Telemetry: SR, no arrhythmias  Respiratory: CTA bilaterally, no w/r/r. Normal respiratory effort. Abdomen: soft, ntnd Skin: no rash or induration seen on limited exam Musculoskeletal: grossly normal tone BUE/BLE Psychiatric: grossly normal mood and affect,  speech fluent and appropriate Neurologic: Awake and oriented 2, grossly non-focal.          Labs on Admission:  Basic Metabolic Panel:  Recent Labs Lab 10/06/14 0443  10/08/14 0600 10/09/14 0752 10/10/14 0455 10/11/14 0519 10/12/14 0406  NA 140  < > 141 142 144 143 149*  K 3.2*  < > 3.4* 3.4* 3.0* 3.4* 3.7  CL 105  < > 108 109 109 110 114*  CO2 28  < > '22 23 25 25 26  '$ GLUCOSE 273*  < > 180* 124* 91 107* 117*  BUN 16  < > '13 11 12 12 14  '$ CREATININE 1.07*  < > 1.01* 1.04* 1.10* 1.00 0.89  CALCIUM 7.9*  < > 7.7* 8.1* 8.1* 8.0* 8.2*  MG 1.7  --   --   --   --  1.9  --   < > = values in this interval not displayed. Liver Function Tests: No results for input(s): AST, ALT, ALKPHOS, BILITOT, PROT, ALBUMIN in the last 168 hours. No results for input(s): LIPASE, AMYLASE in the last 168 hours. No results for input(s): AMMONIA in the last 168 hours. CBC:  Recent Labs Lab 10/08/14 0600 10/09/14 0752 10/10/14 0455 10/11/14 0519 10/12/14 0406  WBC 18.4* 15.9* 13.3* 13.7* 10.2  NEUTROABS 14.5* 12.3* 10.2* 9.7*  --   HGB 8.8* 9.4* 9.3* 8.1* 8.3*  HCT 28.8* 30.0* 29.0* 26.1* 27.6*  MCV 83.7 83.8 85.5 84.2 80.9  PLT 241 PLATELET CLUMPS NOTED ON SMEAR, COUNT APPEARS ADEQUATE 289 297 362    CBG:  Recent Labs Lab 10/11/14 1213 10/11/14 1726 10/11/14 2154 10/12/14 0635 10/12/14 1148  GLUCAP 108* 105* 110* 116* 166*    Radiological Exams on Admission: Dg Chest 1 View  10/13/2014   CLINICAL DATA:  Fall today.  EXAM: CHEST  1 VIEW  COMPARISON:  10/10/2014  FINDINGS: Shallow inspiration. Mild cardiac enlargement. Mild pulmonary vascular congestion demonstrating some improvement since previous study. No focal consolidation or edema. No blunting of costophrenic angles. No pneumothorax. Degenerative changes in the spine and shoulders.  IMPRESSION: Cardiac enlargement with improved vascular congestion. No edema or consolidation.   Electronically Signed   By: Lucienne Capers M.D.   On:  10/13/2014 01:25   Dg Pelvis 1-2 Views  10/13/2014   CLINICAL DATA:  Fall today.  EXAM: PELVIS - 1-2 VIEW  COMPARISON:  CT abdomen and pelvis 10/08/2014  FINDINGS: There is no evidence of pelvic fracture or diastasis. No pelvic bone lesions are seen. Residual contrast material in the colon. Mild degenerative changes in the lower lumbar spine and hips.  IMPRESSION: Negative.   Electronically Signed   By: Lucienne Capers M.D.   On: 10/13/2014 01:26   Ct Head Wo Contrast  10/12/2014   CLINICAL DATA:  Unwitnessed fall at home.  EXAM: CT HEAD WITHOUT CONTRAST  CT CERVICAL SPINE WITHOUT CONTRAST  TECHNIQUE: Multidetector CT imaging of the head and cervical spine was performed following the standard protocol without intravenous contrast. Multiplanar CT image reconstructions of the cervical spine were also generated.  COMPARISON:  CT head 10/06/2014.  FINDINGS: CT HEAD FINDINGS  Postoperative changes with a right frontal craniotomy. Underlying encephalomalacia. Diffuse cerebral atrophy. Ventricular dilatation consistent with central atrophy. Focal encephalomalacia in the right internal capsule consistent with old infarct. Patchy low-attenuation changes in the deep white matter consistent with small vessel ischemia. No mass effect or midline shift. No abnormal extra-axial fluid collections. Gray-white matter junctions are distinct. Basal cisterns are not effaced. No evidence of acute intracranial hemorrhage. No depressed skull fractures. Mucosal thickening in the paranasal sinuses. Mastoid air cells are not opacified. Vascular calcifications.  CT CERVICAL SPINE FINDINGS  Reversal of the usual cervical lordosis. This may be due to patient positioning but ligamentous injury or muscle spasm could also have this appearance. No anterior subluxation. Degenerative changes in the cervical spine with narrowed interspaces and associated endplate hypertrophic changes. Degenerative changes throughout the cervical facet joints. No  vertebral compression deformities. No prevertebral soft tissue swelling. Focal cystic lesions likely representing degenerative cysts. C1-2 articulation appears intact. Bilateral pleural effusions.  IMPRESSION: No acute intracranial abnormalities. Postoperative changes with right frontal craniotomy and underlying encephalomalacia. Chronic atrophy and small vessel ischemic changes. Infarct in the right internal capsule.  Nonspecific reversal of the usual cervical lordosis. Diffuse degenerative changes throughout the cervical spine.  No acute displaced fractures identified. Incidental note of bilateral pleural effusions in the apices.   Electronically Signed   By: Lucienne Capers M.D.   On: 10/12/2014 23:21   Ct Cervical Spine Wo Contrast  10/12/2014   CLINICAL DATA:  Unwitnessed fall at home.  EXAM: CT HEAD WITHOUT CONTRAST  CT CERVICAL SPINE WITHOUT CONTRAST  TECHNIQUE: Multidetector CT imaging of the head and cervical spine was performed following the standard protocol without intravenous contrast. Multiplanar CT image reconstructions of the cervical spine were also generated.  COMPARISON:  CT head 10/06/2014.  FINDINGS: CT HEAD FINDINGS  Postoperative changes with a right frontal craniotomy. Underlying encephalomalacia. Diffuse cerebral atrophy. Ventricular dilatation consistent with central atrophy. Focal encephalomalacia in the right internal capsule consistent with old infarct. Patchy low-attenuation changes in the deep white matter consistent with small vessel ischemia. No mass effect or midline shift. No abnormal extra-axial fluid collections. Gray-white matter junctions are distinct. Basal cisterns are not effaced. No evidence of acute intracranial hemorrhage. No depressed skull fractures. Mucosal thickening in the paranasal sinuses. Mastoid air cells are not opacified. Vascular calcifications.  CT CERVICAL SPINE FINDINGS  Reversal of the usual cervical lordosis. This may be due to patient positioning but  ligamentous injury or muscle spasm could also have this appearance. No anterior subluxation. Degenerative changes in the cervical spine with narrowed interspaces and associated endplate hypertrophic changes. Degenerative changes throughout the cervical facet joints. No vertebral compression deformities. No prevertebral soft tissue swelling. Focal cystic lesions likely representing degenerative cysts. C1-2 articulation appears intact. Bilateral pleural effusions.  IMPRESSION: No acute intracranial abnormalities. Postoperative changes with right frontal craniotomy and underlying encephalomalacia. Chronic atrophy and small vessel ischemic changes. Infarct in the right internal capsule.  Nonspecific reversal of the usual cervical lordosis. Diffuse degenerative changes throughout the cervical spine. No acute displaced fractures identified. Incidental note of bilateral pleural effusions in the apices.   Electronically Signed   By: Lucienne Capers M.D.   On: 10/12/2014 23:21   Dg Femur Min 2 Views Left  10/13/2014   CLINICAL DATA:  Fall today.  EXAM: LEFT FEMUR 2 VIEWS  COMPARISON:  None.  FINDINGS: Degenerative changes in the left hip and left knee. There is no evidence of fracture or other focal bone lesions. Soft tissues are unremarkable. Vascular calcifications.  IMPRESSION: Negative.   Electronically Signed   By: Lucienne Capers M.D.   On: 10/13/2014 01:27    Echocardiogram:  ------------------------------------------------------------ LV EF: 60% -  65%  ------------------------------------------------------------ Indications:   Pulmonary embolus 415.19.  ------------------------------------------------------------ History:  PMH:  Stroke. Risk factors: Former tobacco use. Hypertension. Diabetes mellitus. Dyslipidemia.  ------------------------------------------------------------ Study Conclusions  - Left ventricle: The cavity size was normal. Systolic function was normal. The estimated  ejection fraction was in the range of 60% to 65%. Wall motion was normal; there were no regional wall motion abnormalities, however there is "septal bounce" which is sometimes seen in restrictive physiology. Doppler parameters are consistent with abnormal left ventricular relaxation (grade 1 diastolic dysfunction). Doppler parameters are consistent with high ventricular filling pressure. - Right ventricle: The cavity size was mildly dilated. Wall thickness was normal. - Right atrium: The atrium was mildly dilated. Impressions:  - When compared to 2012 echocardiogram, EF is much improved. No cardiac source of emboli was indentified. Transthoracic echocardiography. M-mode, complete 2D, spectral Doppler, and color Doppler. Height: Height: 162.6cm. Height: 64in. Weight: Weight: 88.1kg. Weight: 193.8lb. Body mass index: BMI: 33.3kg/m^2. Body surface area:  BSA: 2.27m2. Blood pressure:  151/57. Patient status: Inpatient. Location: ICU/CCU  ------------------------------------------------------------  EKG: Independently reviewed. Vent. rate 78 BPM PR interval 150 ms QRS duration 138 ms QT/QTc 487/555 ms P-R-T axes 56 -39 44  Sinus rhythm Left bundle branch block Left bundle branch block No significant change since last tracing  Assessment/Plan Principal Problem:   Syncope Admit to telemetry monitoring. Serial troponin levels.  Active Problems:   Diabetes mellitus with mild nonproliferative diabetic retinopathy Continue Lantus.  Regular insulin sliding scale.    Hyperlipemia Not on a statin at this time.    Essential hypertension Continue current antihypertensive therapy and monitor blood pressure.    GERD Continue omeprazole.    History of meningioma of the brain    Seizure disorder Supportive care. Continue current antiepileptic medications.    Depression Tinea fluoxetine.    Iron deficiency anemia Continue ferrous  sulfate supplementation and monitor H&H periodically.    Code Status: Full code. DVT Prophylaxis: Lovenox SQ. Family Communication: Granddaughters were present in the room. Disposition Plan: Admit to telemetry monitoring for troponin levels trending.  Time spent: Over 70 minutes.  [name] [facility] Pager [phone].

## 2014-10-13 NOTE — Progress Notes (Signed)
Patient ID: [name], female   DOB: [birth year], 81 y.o.   MRN: [id number]   Subjective: [name] is not the best historian, she told me that after she left the hospital she went home, sat on her bed, started feeling her heart racing and felt "weak," then she awoke with her family members around her. Although she has a history of seizures, per the ED note, she had no post-ictal state, tonic-clonic activity, nor urinary incontinence. I tried to call her daughter on both numbers listed in her chart; one was disconnected and the other was the wrong number.  Objective: Vital signs in last 24 hours: Filed Vitals:   10/13/14 0108 10/13/14 0223 10/13/14 0409 10/13/14 0622  BP: 169/77 216/82 192/74 164/72  Pulse: 81 82 77 84  Temp:  98.1 F (36.7 C) 98 F (36.7 C)   TempSrc:  Oral Oral   Resp: '26 20 20   '$ Height:  '5\' 4"'$  (1.626 m)    Weight:  93.9 kg (207 lb 0.2 oz)    SpO2: 96% 94% 98%    General: resting in bed HEENT: PERRL, EOMI, no scleral icterus Cardiac: RRR, no rubs, murmurs or gallops Pulm: clear to auscultation bilaterally, moving normal volumes of air Abd: soft, nontender, nondistended, BS present Ext: warm and well perfused, no pedal edema Neuro: alert and oriented X3, cranial nerves II-XII grossly intact  Lab Results: Basic Metabolic Panel:  Recent Labs Lab 10/11/14 0519 10/12/14 0406 10/13/14 0340  NA 143 149* 146*  K 3.4* 3.7 3.1*  CL 110 114* 110  CO2 '25 26 29  '$ GLUCOSE 107* 117* 122*  BUN '12 14 11  '$ CREATININE 1.00 0.89 0.83  CALCIUM 8.0* 8.2* 8.3*  MG 1.9  --  2.0  PHOS  --   --  2.2*   Liver Function Tests:  Recent Labs Lab 10/13/14 0340  AST 22  ALT 17  ALKPHOS 60  BILITOT 0.3  PROT 6.2*  ALBUMIN 2.5*   CBC:  Recent Labs Lab 10/11/14 0519 10/12/14 0406 10/13/14 0340  WBC 13.7* 10.2 8.3  NEUTROABS 9.7*  --  4.6  HGB 8.1* 8.3* 8.5*  HCT 26.1* 27.6* 28.2*  MCV 84.2 80.9 79.7  PLT 297 362 395   Cardiac Enzymes:  Recent Labs Lab  10/13/14 0340  TROPONINI <0.03   CBG:  Recent Labs Lab 10/11/14 1726 10/11/14 2154 10/12/14 0635 10/12/14 1148 10/13/14 0246 10/13/14 0604  GLUCAP 105* 110* 116* 166* 110* 126*   Anemia Panel:  Recent Labs Lab 10/10/14 1335  FERRITIN 419*   Studies/Results: Dg Chest 1 View  10/13/2014   CLINICAL DATA:  Fall today.  EXAM: CHEST  1 VIEW  COMPARISON:  10/10/2014  FINDINGS: Shallow inspiration. Mild cardiac enlargement. Mild pulmonary vascular congestion demonstrating some improvement since previous study. No focal consolidation or edema. No blunting of costophrenic angles. No pneumothorax. Degenerative changes in the spine and shoulders.  IMPRESSION: Cardiac enlargement with improved vascular congestion. No edema or consolidation.   Electronically Signed   By: Lucienne Capers M.D.   On: 10/13/2014 01:25   Dg Pelvis 1-2 Views  10/13/2014   CLINICAL DATA:  Fall today.  EXAM: PELVIS - 1-2 VIEW  COMPARISON:  CT abdomen and pelvis 10/08/2014  FINDINGS: There is no evidence of pelvic fracture or diastasis. No pelvic bone lesions are seen. Residual contrast material in the colon. Mild degenerative changes in the lower lumbar spine and hips.  IMPRESSION: Negative.   Electronically Signed   By:  Lucienne Capers M.D.   On: 10/13/2014 01:26   Ct Head Wo Contrast  10/12/2014   CLINICAL DATA:  Unwitnessed fall at home.  EXAM: CT HEAD WITHOUT CONTRAST  CT CERVICAL SPINE WITHOUT CONTRAST  TECHNIQUE: Multidetector CT imaging of the head and cervical spine was performed following the standard protocol without intravenous contrast. Multiplanar CT image reconstructions of the cervical spine were also generated.  COMPARISON:  CT head 10/06/2014.  FINDINGS: CT HEAD FINDINGS  Postoperative changes with a right frontal craniotomy. Underlying encephalomalacia. Diffuse cerebral atrophy. Ventricular dilatation consistent with central atrophy. Focal encephalomalacia in the right internal capsule consistent with  old infarct. Patchy low-attenuation changes in the deep white matter consistent with small vessel ischemia. No mass effect or midline shift. No abnormal extra-axial fluid collections. Gray-white matter junctions are distinct. Basal cisterns are not effaced. No evidence of acute intracranial hemorrhage. No depressed skull fractures. Mucosal thickening in the paranasal sinuses. Mastoid air cells are not opacified. Vascular calcifications.  CT CERVICAL SPINE FINDINGS  Reversal of the usual cervical lordosis. This may be due to patient positioning but ligamentous injury or muscle spasm could also have this appearance. No anterior subluxation. Degenerative changes in the cervical spine with narrowed interspaces and associated endplate hypertrophic changes. Degenerative changes throughout the cervical facet joints. No vertebral compression deformities. No prevertebral soft tissue swelling. Focal cystic lesions likely representing degenerative cysts. C1-2 articulation appears intact. Bilateral pleural effusions.  IMPRESSION: No acute intracranial abnormalities. Postoperative changes with right frontal craniotomy and underlying encephalomalacia. Chronic atrophy and small vessel ischemic changes. Infarct in the right internal capsule.  Nonspecific reversal of the usual cervical lordosis. Diffuse degenerative changes throughout the cervical spine. No acute displaced fractures identified. Incidental note of bilateral pleural effusions in the apices.   Electronically Signed   By: Lucienne Capers M.D.   On: 10/12/2014 23:21   Ct Cervical Spine Wo Contrast  10/12/2014   CLINICAL DATA:  Unwitnessed fall at home.  EXAM: CT HEAD WITHOUT CONTRAST  CT CERVICAL SPINE WITHOUT CONTRAST  TECHNIQUE: Multidetector CT imaging of the head and cervical spine was performed following the standard protocol without intravenous contrast. Multiplanar CT image reconstructions of the cervical spine were also generated.  COMPARISON:  CT head  10/06/2014.  FINDINGS: CT HEAD FINDINGS  Postoperative changes with a right frontal craniotomy. Underlying encephalomalacia. Diffuse cerebral atrophy. Ventricular dilatation consistent with central atrophy. Focal encephalomalacia in the right internal capsule consistent with old infarct. Patchy low-attenuation changes in the deep white matter consistent with small vessel ischemia. No mass effect or midline shift. No abnormal extra-axial fluid collections. Gray-white matter junctions are distinct. Basal cisterns are not effaced. No evidence of acute intracranial hemorrhage. No depressed skull fractures. Mucosal thickening in the paranasal sinuses. Mastoid air cells are not opacified. Vascular calcifications.  CT CERVICAL SPINE FINDINGS  Reversal of the usual cervical lordosis. This may be due to patient positioning but ligamentous injury or muscle spasm could also have this appearance. No anterior subluxation. Degenerative changes in the cervical spine with narrowed interspaces and associated endplate hypertrophic changes. Degenerative changes throughout the cervical facet joints. No vertebral compression deformities. No prevertebral soft tissue swelling. Focal cystic lesions likely representing degenerative cysts. C1-2 articulation appears intact. Bilateral pleural effusions.  IMPRESSION: No acute intracranial abnormalities. Postoperative changes with right frontal craniotomy and underlying encephalomalacia. Chronic atrophy and small vessel ischemic changes. Infarct in the right internal capsule.  Nonspecific reversal of the usual cervical lordosis. Diffuse degenerative changes throughout the cervical spine.  No acute displaced fractures identified. Incidental note of bilateral pleural effusions in the apices.   Electronically Signed   By: Lucienne Capers M.D.   On: 10/12/2014 23:21   Dg Femur Min 2 Views Left  10/13/2014   CLINICAL DATA:  Fall today.  EXAM: LEFT FEMUR 2 VIEWS  COMPARISON:  None.  FINDINGS:  Degenerative changes in the left hip and left knee. There is no evidence of fracture or other focal bone lesions. Soft tissues are unremarkable. Vascular calcifications.  IMPRESSION: Negative.   Electronically Signed   By: Lucienne Capers M.D.   On: 10/13/2014 01:27   Medications: I have reviewed the patient's current medications. Scheduled Meds: . aspirin EC  81 mg Oral Daily  . carvedilol  25 mg Oral BID WC  . chlorhexidine  15 mL Mouth/Throat BID  . enoxaparin (LOVENOX) injection  40 mg Subcutaneous Q24H  . feeding supplement (GLUCERNA SHAKE)  237 mL Oral BID BM  . FLUoxetine  10 mg Oral QHS  . insulin glargine  5 Units Subcutaneous QHS  . iron polysaccharides  150 mg Oral Daily  . levETIRAcetam  750 mg Oral BID  . lisinopril  40 mg Oral Daily  . pantoprazole  40 mg Oral Daily  . simvastatin  20 mg Oral q1800  . sodium chloride  3 mL Intravenous Q12H   Continuous Infusions:  PRN Meds:.diclofenac sodium, ondansetron **OR** ondansetron (ZOFRAN) IV   Assessment/Plan:  Syncope: I'll need to get in touch with her daughter to get more details on what exactly happened. It sounds more likely to be vasovagal syncope versus orthostatic hypotension versus arrhythmia, much less likely to be a seizure or myocardial ischemia. Her chest x-ray was clear, she was afebrile, and her physical exam was unremarkable. She's currently on telemetry, her vital signs are notable for hypertension to 216/82 but otherwise normal. I suspect she'll need SNF placement but I'll talk to her daughter. Everette Rank try to get in touch with her daughter today -Obtain orthostatics  Hypertension: Recent pressures up to 216/82. Wide systolic-diastolic gap so I'll get a manual cuff reading, obtain orthostatics, then consider adding Imdur. Her current regiment is carvedilol '25mg'$  BID and lisinopril '40mg'$  daily. -Manual blood pressure -Consider imdur  Dispo: Disposition is deferred at this time, awaiting improvement of current  medical problems.    The patient does have a current PCP Liberty Handy, MD) and does need an [facility] hospital follow-up appointment after discharge.  The patient does have transportation limitations that hinder transportation to clinic appointments.  .Services Needed at time of discharge: Y = Yes, Blank = No PT:   OT:   RN:   Equipment:   Other:     LOS: 1 day   Loleta Chance, MD 10/13/2014, 10:15 AM

## 2014-10-13 NOTE — ED Notes (Signed)
Henri Medal, receiving RN on 3E to report bp and xrays have been completed. Awaiting MD to round, and patient to be transported to floor. She acknowledges.

## 2014-10-13 NOTE — ED Notes (Signed)
Admitting MD paged for bp management.

## 2014-10-13 NOTE — Evaluation (Signed)
Physical Therapy Evaluation Patient Details Name: [name] MRN: [id number] DOB: 10-22-1933 Today's Date: 10/13/2014   History of Present Illness  Patient is a 81 yo female admitted 10/12/14 following unwitnessed fall.  Patient was d/c'ed from hospital earlier in day of current admission day.   PMH:  DM, CVA, dementia, dysphagia, CHF, HTN, lung ca, meningioma s/p resection  Clinical Impression  Patient presents with problems listed below.  Will benefit from acute PT to maximize functional mobility prior to discharge.  Recommend SNF at discharge for continued therapy prior to return home due to decreased mobility, cognition, and safety.    Follow Up Recommendations SNF;Supervision/Assistance - 24 hour    Equipment Recommendations  None recommended by PT    Recommendations for Other Services       Precautions / Restrictions Precautions Precautions: Fall Precaution Comments: Falls at home Restrictions Weight Bearing Restrictions: No      Mobility  Bed Mobility Overal bed mobility: Needs Assistance Bed Mobility: Supine to Sit;Sit to Supine     Supine to sit: Mod assist;HOB elevated Sit to supine: Mod assist;HOB elevated   General bed mobility comments: Verbal and tactile cues for technique.  Required repeated cueing to stay on task.  Assist to bring LE's off of bed and to raise trunk to sitting position.  Required increased time.  Once upright, patient able to maintain sitting balance with min guard and 1 UE to 0 UE support.  Patient sat EOB x 10 minutes, eating dinner.  Patient required verbal cues to take each bite of food.  Distracted looking out room window.  Returned to supine with mod assist.  +2 to scoot to [facility].  Set up to complete dinner.  Transfers                    Ambulation/Gait                Stairs            Wheelchair Mobility    Modified Rankin (Stroke Patients Only)       Balance Overall balance assessment: Needs  assistance Sitting-balance support: Single extremity supported;Feet unsupported Sitting balance-Leahy Scale: Poor Sitting balance - Comments: Patient leaning to left side and posteriorly.  Able to move to upright position and maintain for 15 seconds. Postural control: Posterior lean;Left lateral lean                                   Pertinent Vitals/Pain Pain Assessment: No/denies pain    Home Living Family/patient expects to be discharged to:: Skilled nursing facility Living Arrangements: Children               Additional Comments: Patient is a high fall risk due to decreased mobility and decreased cognition.  Has had multiple falls at home.    Prior Function Level of Independence: Needs assistance   Gait / Transfers Assistance Needed: Was requiring +2 assist to stand on 10/09/14 PT session.           Hand Dominance   Dominant Hand: Left    Extremity/Trunk Assessment   Upper Extremity Assessment: Generalized weakness           Lower Extremity Assessment: Generalized weakness         Communication   Communication: No difficulties (Slow to respond)  Cognition Arousal/Alertness: Lethargic Behavior During Therapy: Flat affect Overall Cognitive Status: Impaired/Different from baseline  Area of Impairment: Orientation;Attention;Following commands;Awareness;Problem solving Orientation Level: Disoriented to;Place Current Attention Level: Sustained (Easily distracted during functional tasks) Memory: Decreased short-term memory Following Commands: Follows one step commands inconsistently;Follows one step commands with increased time     Problem Solving: Slow processing;Decreased initiation;Difficulty sequencing;Requires verbal cues General Comments: Increased response/reaction time to verbal cues    General Comments      Exercises        Assessment/Plan    PT Assessment Patient needs continued PT services  PT Diagnosis Difficulty  walking;Generalized weakness;Altered mental status   PT Problem List Decreased strength;Decreased activity tolerance;Decreased balance;Decreased mobility;Decreased cognition;Decreased knowledge of use of DME;Obesity  PT Treatment Interventions DME instruction;Gait training;Functional mobility training;Therapeutic activities;Therapeutic exercise;Balance training;Cognitive remediation;Patient/family education   PT Goals (Current goals can be found in the Care Plan section) Acute Rehab PT Goals Patient Stated Goal: none stated PT Goal Formulation: Patient unable to participate in goal setting Time For Goal Achievement: 10/20/14 Potential to Achieve Goals: Fair    Frequency Min 3X/week   Barriers to discharge        Co-evaluation               End of Session   Activity Tolerance: Patient limited by fatigue;Patient limited by lethargy Patient left: in bed;with call bell/phone within reach;with bed alarm set (Seizure pads on bed) Nurse Communication: Mobility status         Time: 1714-1740 PT Time Calculation (min) (ACUTE ONLY): 26 min   Charges:   PT Evaluation $Initial PT Evaluation Tier I: 1 Procedure PT Treatments $Therapeutic Activity: 8-22 mins   PT G Codes:        Despina Pole 11/12/14, 6:01 PM Carita Pian. Sanjuana Kava, [place] Pager [phone]

## 2014-10-13 NOTE — Progress Notes (Signed)
   10/13/14 0223  Vitals  Temp 98.1 F (36.7 C)  Temp Source Oral  BP (!) 216/82 mmHg  BP Location Right Arm  BP Method Automatic  Patient Position (if appropriate) Lying  Pulse Rate 82  Pulse Rate Source Dinamap  Resp 20  Oxygen Therapy  SpO2 94 %  O2 Device Room Air  Height and Weight  Height '5\' 4"'$  (1.626 m)  Weight 93.9 kg (207 lb 0.2 oz)  BSA (Calculated - sq m) 2.06 sq meters  BMI (Calculated) 35.6  Weight in (lb) to have BMI = 25 145.3  Admitted pt to rm 3E14 from ED, pt alert and oriented, denied pain at this time, admission assessment done, orders carried out. Pt's BP elevated, metoprolol '5mg'$  IV ordered. Call bell placed within reach. Will continue to monitor.

## 2014-10-13 NOTE — Progress Notes (Signed)
Update 8/27 at 1350:  I spoke to Mrs. Cantrelle's daughters this afternoon and got a better idea of what brought her back into the hospital.  Last night, after she got out of the hospital, she was lying in bed and rolled out, which is not unusual for her. Her granddaughter saw her on the ground in a "praying" position, asked if she needed help, and she said no. The granddaugters walked away but came back a few minutes later to see her lying flat on the ground. She was still conscious during this time and it's tough to say whether she actually lost consciousness because the patient can't remember what happened. Regardless, the granddaughter called her family into the room to help up as they've done many times, but she wasn't helping them because she felt mostly limp. So the family called 911 just to help them get her into bed. When 911 arrived, they noticed her left pupil was much larger than her right and was not reacting to light, so they took her to the hospital.  When I examined the patient, she was conversing with me, had full strength, and per nursing, she was able to get to the bedside commode without much problem. Her left pupil is indeed larger and non-reactive, but it is irregular in shape and she said she had cataracts surgery on it a few years ago. Her orthostatics were negative. She did have a subtle effusion on her left knee but it wasn't warm to the touch. We'll get an x-ray just in case.  I suspect she is simply deconditioned from her prior hospital stay. Her gradual functional decline and lethargy over the last month seems to be temporally-related to starting lamotrigine for what sounds like absence seizures earlier this month. Per UpToDate, this is a side effect of the drug, and the lamotrigine was stopped last week. I do not think she had a seizure yesterday.  We'll watch her today, get an x-ray of her knees, get physical therapy to see her, and plan for discharge tomorrow with a diagnosis of  deconditioning after her recent hospital stay.  Loleta Chance, MD

## 2014-10-13 NOTE — ED Notes (Signed)
Reported bp to Dr. Thurmond Butts, MD acknowledges, 721 systolic. No new orders, patient now travelling to xray. MD acknowledges.

## 2014-10-14 DIAGNOSIS — I10 Essential (primary) hypertension: Secondary | ICD-10-CM

## 2014-10-14 DIAGNOSIS — W19XXXA Unspecified fall, initial encounter: Secondary | ICD-10-CM | POA: Insufficient documentation

## 2014-10-14 LAB — BASIC METABOLIC PANEL
Anion gap: 10 (ref 5–15)
BUN: 8 mg/dL (ref 6–20)
CHLORIDE: 104 mmol/L (ref 101–111)
CO2: 29 mmol/L (ref 22–32)
CREATININE: 0.8 mg/dL (ref 0.44–1.00)
Calcium: 8 mg/dL — ABNORMAL LOW (ref 8.9–10.3)
GFR calc non Af Amer: >60 mL/min (ref >60)
Glucose, Bld: 123 mg/dL — ABNORMAL HIGH (ref 65–99)
POTASSIUM: 3.4 mmol/L — AB (ref 3.5–5.1)
Sodium: 143 mmol/L (ref 135–145)

## 2014-10-14 LAB — MAGNESIUM: Magnesium: 1.9 mg/dL (ref 1.7–2.4)

## 2014-10-14 LAB — URINE CULTURE: Culture: NO GROWTH

## 2014-10-14 LAB — GLUCOSE, CAPILLARY
GLUCOSE-CAPILLARY: 143 mg/dL — AB (ref 65–99)
Glucose-Capillary: 126 mg/dL — ABNORMAL HIGH (ref 65–99)
Glucose-Capillary: 116 mg/dL — ABNORMAL HIGH (ref 65–99)
Glucose-Capillary: 146 mg/dL — ABNORMAL HIGH (ref 65–99)

## 2014-10-14 MED ORDER — AMLODIPINE BESYLATE 5 MG PO TABS: 5.0000 mg | ORAL_TABLET | Freq: Every day | ORAL | Status: DC

## 2014-10-14 MED ORDER — POTASSIUM CHLORIDE CRYS ER 20 MEQ PO TBCR
40.0000 meq | EXTENDED_RELEASE_TABLET | Freq: Once | ORAL | Status: AC
  Administered 2014-10-14: 40 meq via ORAL
  Filled 2014-10-14: qty 2

## 2014-10-14 NOTE — Progress Notes (Signed)
Pt bed unable to be delivered till 8-9p daughter unable to pick up patient attempted to arrange transportation with [employer] officer, [employer]. MD and daughter made aware and cancelled discharge home.

## 2014-10-14 NOTE — Progress Notes (Signed)
Pt alert and eating at beginning of shift eating dinner. When giving pt meds, pt drowsy, VSS, BP 135/59, O2 100% on RA. Lung sounds clear. Pt responds to voice and touch, Pt able to open eyes, grip hands, and swallow ginger ale, but falls asleep easily. Pt took half of one pill then when asked if she could take the other pills, pt shook her head no. Will continue to monitor. Ronnette Hila, RN

## 2014-10-14 NOTE — Progress Notes (Signed)
Patient ID: [name], female   DOB: 1933-07-26, 81 y.o.   MRN: [id number]   Subjective: [name] was eating her breakfast and feeling fine, without complaints today. She said physical therapy came by yesterday and they were very nice to her. When I asked whether she wanted to go home or get some help at a nursing home to help prevent her from falling, she said she wanted to go home. I asked the nurse to page me when her family arrived so I can discuss further with them.  Objective: Vital signs in last 24 hours: Filed Vitals:   10/13/14 1502 10/13/14 2104 10/14/14 0415 10/14/14 0950  BP: 183/65 170/71 165/59 172/56  Pulse: 77 63 77 80  Temp: 98.5 F (36.9 C) 98.6 F (37 C) 98.4 F (36.9 C)   TempSrc: Oral Oral Oral   Resp: '18 18 18   '$ Height:      Weight:   91.6 kg (201 lb 15.1 oz)   SpO2: 100% 97% 99% 94%   General: resting in bed eating breakfast HEENT: left pupil irregular, dilated, non-responsive to light, unchanged from yesterday Cardiac: RRR, no rubs, murmurs or gallops Pulm: clear to auscultation bilaterally, moving normal volumes of air Abd: soft, nontender, nondistended, BS present Ext: warm and well perfused, no pedal edema  Lab Results: Basic Metabolic Panel:  Recent Labs Lab 10/11/14 0519  10/13/14 0340 10/13/14 2244 10/14/14 0429  NA 143  < > 146* 145 143  K 3.4*  < > 3.1* 3.0* 3.4*  CL 110  < > 110 107 104  CO2 25  < > '29 29 29  '$ GLUCOSE 107*  < > 122* 131* 123*  BUN 12  < > '11 8 8  '$ CREATININE 1.00  < > 0.83 0.82 0.80  CALCIUM 8.0*  < > 8.3* 8.2* 8.0*  MG 1.9  --  2.0  --   --   PHOS  --   --  2.2*  --   --   < > = values in this interval not displayed.   Medications: I have reviewed the patient's current medications. Scheduled Meds: . amLODipine  5 mg Oral Daily  . aspirin EC  81 mg Oral Daily  . carvedilol  25 mg Oral BID WC  . chlorhexidine  15 mL Mouth/Throat BID  . enoxaparin (LOVENOX) injection  40 mg Subcutaneous Q24H  . feeding  supplement (GLUCERNA SHAKE)  237 mL Oral BID BM  . FLUoxetine  10 mg Oral QHS  . insulin glargine  5 Units Subcutaneous QHS  . iron polysaccharides  150 mg Oral Daily  . levETIRAcetam  750 mg Oral BID  . lisinopril  40 mg Oral Daily  . pantoprazole  40 mg Oral Daily  . simvastatin  20 mg Oral q1800  . sodium chloride  3 mL Intravenous Q12H   Continuous Infusions:  PRN Meds:.diclofenac sodium, ondansetron **OR** ondansetron (ZOFRAN) IV   Assessment/Plan:  Mechanical fall due to deconditioning: After talking with her daughters yesterday, this sounds more like a mechanical fall due to deconditioning from her recent hospital stay. We've ruled out cardiac etiologies, orthostatic hypotension, and seizure. Physical therapy recommended SNF placement which I completely agree with; however the patient and her family adamantly want her at home. I told them it is imperative for them to keep a very close eye on her because one fall can precipitate a steep decline in her health. They understood.   Hypertension:. Her current regiment is carvedilol '25mg'$   BID and lisinopril '40mg'$  daily. Better controlled with amlodipine which we'll send her home with. -Discharge home with amlodipine '5mg'$  daily  Dispo: She can go home today after I talk to the family about what support they'll need.  The patient does have a current PCP Liberty Handy, MD) and does need an [facility] hospital follow-up appointment after discharge.  The patient does have transportation limitations that hinder transportation to clinic appointments.  .Services Needed at time of discharge: Y = Yes, Blank = No PT:   OT:   RN:   Equipment:   Other:     LOS: 2 days   Loleta Chance, MD 10/14/2014, 11:36 AM

## 2014-10-14 NOTE — Progress Notes (Signed)
  PROGRESS NOTE MEDICINE TEACHING ATTENDING   Day 2 of stay Patient name: [name]   Medical record number: [id number] Date of birth: February 11, 1934   [name] has no complaints. She reports that she did not fall. She was kneeling on the floor and her family could not put her to bed.  Blood pressure 172/56, pulse 80, temperature 98.4 F (36.9 C), temperature source Oral, resp. rate 18, height '5\' 4"'$  (1.626 m), weight 201 lb 15.1 oz (91.6 kg), SpO2 94 %. The patient is alert and oriented, comfortable, in no acute distress. PERRL right, irregular left pupil, not reactive to light, like surgical pupil. EOMI. Heart exhibits regular rate and rhythm, no murmurs. Lungs are clear to auscultation. Abdomen is soft and non-tender. There is no pedal edema and good pedal pulses. There are no gross focal neurological deficits apparent.   Assessment/Plan  S/p fall - Unsure if this was syncope or mechanical. Patient does not endorse falling but not a good historian. Telemetry monitoring for now. Does not seem like she had a seizure, no orthostatis or AMI.  Surgical Pupil- chronic finding, present in other past notes. This does not appear to be related to a brain lesion as she has no neurological deficit at this time.   Uncontrolled Hypertension - needs better control. Home medications started.   Multiple falls - SNF might be a good option for this patient. However it is noted that the family wants to take her home. We will do PT again today and work on possible deconditioning due to long hospital stay. Would this patient be a good candidate for CIR?   I have discussed the care of this patient with my IM team residents. Please see the resident note for details. I have read Dr Melburn Hake note from yesterday and today and I agree with the assessment and documentation.  [name], [name] 10/14/2014, 11:45 AM.

## 2014-10-14 NOTE — Discharge Instructions (Signed)

## 2014-10-14 NOTE — Discharge Summary (Signed)
Name: [name] MRN: [id number] DOB: 09-10-1933 81 y.o. PCP: [name] Handy, MD  Date of Admission: 10/12/2014  8:39 PM Date of Discharge: 10/14/2014 Attending Physician: [name] Fireman, MD  Discharge Diagnosis: 1. Mechanical fall without injury secondary to deconditioning  Discharge Medications:   Medication List    TAKE these medications        acetaminophen-codeine 300-30 MG per tablet  Commonly known as:  TYLENOL #3  Take 1 tablet by mouth every 6 (six) hours as needed for moderate pain.     amLODipine 5 MG tablet  Commonly known as:  NORVASC  Take 1 tablet (5 mg total) by mouth daily.     aspirin EC 81 MG tablet  Take 81 mg by mouth daily.     carvedilol 25 MG tablet  Commonly known as:  COREG  Take 1 tablet (25 mg total) by mouth 2 (two) times daily with a meal.     chlorhexidine 0.12 % solution  Commonly known as:  PERIDEX  Use as directed 15 mLs in the mouth or throat 2 (two) times daily.     diclofenac sodium 1 % Gel  Commonly known as:  VOLTAREN  Apply 4 g topically 4 (four) times daily.     feeding supplement (GLUCERNA SHAKE) Liqd  Take 237 mLs by mouth 2 (two) times daily between meals.     FERREX 150 150 MG capsule  Generic drug:  iron polysaccharides  TAKE 1 CAPSULE BY MOUTH DAILY.     FLUoxetine 10 MG capsule  Commonly known as:  PROZAC  TAKE 1 CAPSULE (10 MG TOTAL) BY MOUTH AT BEDTIME.     glucose blood test strip  Commonly known as:  ACCU-CHEK AVIVA  Use to Check Blood Sugars 3 Times Daily.Dx Code: 250.00. Insulin dependent     insulin aspart 100 UNIT/ML FlexPen  Commonly known as:  NOVOLOG FLEXPEN  Novolog Insulin: 91 - 160 5 units 161- 230 7 units 231-300 9 units 301- 370 11 units 371 - 440 13 units 441 - 510 15 units     Insulin Glargine 100 UNIT/ML Solostar Pen  Commonly known as:  LANTUS SOLOSTAR  Inject 17 Units into the skin at bedtime.     levETIRAcetam 750 MG tablet  Commonly known as:  KEPPRA  Take 1 tablet  (750 mg total) by mouth 2 (two) times daily.     lisinopril 40 MG tablet  Commonly known as:  PRINIVIL,ZESTRIL  Take 1 tablet (40 mg total) by mouth daily.     omeprazole 20 MG capsule  Commonly known as:  PRILOSEC  Take 1 capsule (20 mg total) by mouth daily.     simvastatin 20 MG tablet  Commonly known as:  ZOCOR  Take 20 mg by mouth daily at 6 PM.        Disposition and follow-up:   [name] was discharged from [facility] in Good condition.  At the hospital follow up visit please address:  1.  Her fall risks and appetite  2.  Labs / imaging needed at time of follow-up: None  3.  Pending labs/ test needing follow-up: None  Follow-up Appointments:     Follow-up Information    Follow up with [name] Handy, MD. Schedule an appointment as soon as possible for a visit in 1 week.   Specialty:  Internal Medicine   Why:  Hospital follow-up   Contact information:   [place]  [id number] [phone]  Procedures Performed:  Dg Chest 1 View  10/13/2014   CLINICAL DATA:  Fall today.  EXAM: CHEST  1 VIEW  COMPARISON:  10/10/2014  FINDINGS: Shallow inspiration. Mild cardiac enlargement. Mild pulmonary vascular congestion demonstrating some improvement since previous study. No focal consolidation or edema. No blunting of costophrenic angles. No pneumothorax. Degenerative changes in the spine and shoulders.  IMPRESSION: Cardiac enlargement with improved vascular congestion. No edema or consolidation.   Electronically Signed   By: [name] M.D.   On: 10/13/2014 01:25   Dg Pelvis 1-2 Views  10/13/2014   CLINICAL DATA:  Fall today.  EXAM: PELVIS - 1-2 VIEW  COMPARISON:  CT abdomen and pelvis 10/08/2014  FINDINGS: There is no evidence of pelvic fracture or diastasis. No pelvic bone lesions are seen. Residual contrast material in the colon. Mild degenerative changes in the lower lumbar spine and hips.  IMPRESSION: Negative.    Electronically Signed   By: [name] M.D.   On: 10/13/2014 01:26   Dg Knee 1-2 Views Left  10/13/2014   CLINICAL DATA:  81 year old female with history of trauma from a fall onto her knees yesterday, complaining of bilateral anterior knee pain (right greater than left).  EXAM: LEFT KNEE - 1-2 VIEW  COMPARISON:  No priors.  FINDINGS: Two views of the left knee demonstrate no acute displaced fracture, subluxation or dislocation. For mild chondrocalcinosis noted in the medial compartment. Mild degenerative changes of osteoarthritis.  IMPRESSION: 1. No acute radiographic abnormality of the left knee. 2. Mild degenerative changes of osteoarthritis. 3. Mild chondrocalcinosis in the medial compartment.   Electronically Signed   By: [name] M.D.   On: 10/13/2014 14:45   Dg Knee 1-2 Views Right  10/13/2014   CLINICAL DATA:  Joint effusion, fell yesterday onto knees, having anterior knee pain RIGHT greater than LEFT, history diabetes mellitus, lung cancer  EXAM: RIGHT KNEE - 1-2 VIEW  COMPARISON:  None  FINDINGS: Diffuse osseous demineralization.  Joint spaces preserved.  Patellar spurs at quadriceps and patellar tendon insertions.  No acute fracture, dislocation, or bone destruction.  No knee joint effusion.  IMPRESSION: No acute osseous abnormalities.   Electronically Signed   By: Lavonia Dana M.D.   On: 10/13/2014 14:52   Ct Head Wo Contrast  10/12/2014   CLINICAL DATA:  Unwitnessed fall at home.  EXAM: CT HEAD WITHOUT CONTRAST  CT CERVICAL SPINE WITHOUT CONTRAST  TECHNIQUE: Multidetector CT imaging of the head and cervical spine was performed following the standard protocol without intravenous contrast. Multiplanar CT image reconstructions of the cervical spine were also generated.  COMPARISON:  CT head 10/06/2014.  FINDINGS: CT HEAD FINDINGS  Postoperative changes with a right frontal craniotomy. Underlying encephalomalacia. Diffuse cerebral atrophy. Ventricular dilatation consistent with central  atrophy. Focal encephalomalacia in the right internal capsule consistent with old infarct. Patchy low-attenuation changes in the deep white matter consistent with small vessel ischemia. No mass effect or midline shift. No abnormal extra-axial fluid collections. Gray-white matter junctions are distinct. Basal cisterns are not effaced. No evidence of acute intracranial hemorrhage. No depressed skull fractures. Mucosal thickening in the paranasal sinuses. Mastoid air cells are not opacified. Vascular calcifications.  CT CERVICAL SPINE FINDINGS  Reversal of the usual cervical lordosis. This may be due to patient positioning but ligamentous injury or muscle spasm could also have this appearance. No anterior subluxation. Degenerative changes in the cervical spine with narrowed interspaces and associated endplate hypertrophic changes. Degenerative changes throughout the cervical facet  joints. No vertebral compression deformities. No prevertebral soft tissue swelling. Focal cystic lesions likely representing degenerative cysts. C1-2 articulation appears intact. Bilateral pleural effusions.  IMPRESSION: No acute intracranial abnormalities. Postoperative changes with right frontal craniotomy and underlying encephalomalacia. Chronic atrophy and small vessel ischemic changes. Infarct in the right internal capsule.  Nonspecific reversal of the usual cervical lordosis. Diffuse degenerative changes throughout the cervical spine. No acute displaced fractures identified. Incidental note of bilateral pleural effusions in the apices.   Electronically Signed   By: [name] M.D.   On: 10/12/2014 23:21   Ct Cervical Spine Wo Contrast  10/12/2014   CLINICAL DATA:  Unwitnessed fall at home.  EXAM: CT HEAD WITHOUT CONTRAST  CT CERVICAL SPINE WITHOUT CONTRAST  TECHNIQUE: Multidetector CT imaging of the head and cervical spine was performed following the standard protocol without intravenous contrast. Multiplanar CT image  reconstructions of the cervical spine were also generated.  COMPARISON:  CT head 10/06/2014.  FINDINGS: CT HEAD FINDINGS  Postoperative changes with a right frontal craniotomy. Underlying encephalomalacia. Diffuse cerebral atrophy. Ventricular dilatation consistent with central atrophy. Focal encephalomalacia in the right internal capsule consistent with old infarct. Patchy low-attenuation changes in the deep white matter consistent with small vessel ischemia. No mass effect or midline shift. No abnormal extra-axial fluid collections. Gray-white matter junctions are distinct. Basal cisterns are not effaced. No evidence of acute intracranial hemorrhage. No depressed skull fractures. Mucosal thickening in the paranasal sinuses. Mastoid air cells are not opacified. Vascular calcifications.  CT CERVICAL SPINE FINDINGS  Reversal of the usual cervical lordosis. This may be due to patient positioning but ligamentous injury or muscle spasm could also have this appearance. No anterior subluxation. Degenerative changes in the cervical spine with narrowed interspaces and associated endplate hypertrophic changes. Degenerative changes throughout the cervical facet joints. No vertebral compression deformities. No prevertebral soft tissue swelling. Focal cystic lesions likely representing degenerative cysts. C1-2 articulation appears intact. Bilateral pleural effusions.  IMPRESSION: No acute intracranial abnormalities. Postoperative changes with right frontal craniotomy and underlying encephalomalacia. Chronic atrophy and small vessel ischemic changes. Infarct in the right internal capsule.  Nonspecific reversal of the usual cervical lordosis. Diffuse degenerative changes throughout the cervical spine. No acute displaced fractures identified. Incidental note of bilateral pleural effusions in the apices.   Electronically Signed   By: [name] M.D.   On: 10/12/2014 23:21  Dg Femur Min 2 Views Left  10/13/2014   CLINICAL  DATA:  Fall today.  EXAM: LEFT FEMUR 2 VIEWS  COMPARISON:  None.  FINDINGS: Degenerative changes in the left hip and left knee. There is no evidence of fracture or other focal bone lesions. Soft tissues are unremarkable. Vascular calcifications.  IMPRESSION: Negative.   Electronically Signed   By: [name] M.D.   On: 10/13/2014 01:27   Admission HPI:  VARONICA SIHARATH is a 81 y.o. female with a past medical history of dementia, status post meningioma resection, CVA, type 2 diabetes, hyperlipidemia, hypertension, GERD, depression, lung cancer, status post right lower lobectomy in 2012 who was brought by her family members due to having a fall at home. Per family member, there was no tonic-clonic activity, no postictal-like, And no urinary incontinence. Patient was just recently discharged from the hospital due to a UTI. No further information at this time given the patient's clinical status and history of dementia.  Hospital Course by problem list:   1. Mechanical fall secondary to deconditioning: Ms. Ovens is a 81 year old lady  who fell the day after she was discharged for a UTI (see prior note for details on that admission). Her family said they saw her on all fours beside her bed praying, which is not uprior nusual for her, and a few minutes later she was lying flat. They called 911 just to get some help lifting her up because two of them had strained their back lifting her over the last month. When the paramedics arrived, they noticed her left pupil was fixed and dilated so they sent her to the emergency department and she was admitted. She did not lose consciousness nor hit her herself; she said she simply felt weak. A head CT was negative for bleed and radiographs of her spine, pelvis, hips, and knees were negative for fracture. Her left pupil is irregularly dilated from glaucoma surgery she had done years ago, not caused from a central issue as was suspected. We ruled out cardiac etiologies with  a normal EKG, normal troponins times three, and there were no events on telemetry. We did not think this was a seizure because there was no tonic-clonic activity nor post-ictal state. We surmised her fall was from deconditioning after her most recent stay, and we discharged her home to her family who refused SNF placement despite our recommendations. We got her a hospital bed, wheelchair, Hoyer lift, home health, physical therapy, and occupational therapy in hopes of preventing future falls.  2. Hypertension: She was hypertensive to the 200s/110s on admission so we started amlodipine '5mg'$  in addition to her home regiment of lisinopril '40mg'$  and carvedilol '25mg'$  daily. Her pressures improved to 160s/60s by the time she left.  Discharge Vitals:   BP 172/56 mmHg  Pulse 80  Temp(Src) 98.4 F (36.9 C) (Oral)  Resp 18  Ht '5\' 4"'$  (1.626 m)  Wt 91.6 kg (201 lb 15.1 oz)  BMI 34.65 kg/m2  SpO2 94%  Discharge Labs:  Results for orders placed or performed during the hospital encounter of 10/12/14 (from the past 24 hour(s))  Troponin I     Status: None   Collection Time: 10/13/14  3:33 PM  Result Value Ref Range   Troponin I <0.03 <0.031 ng/mL  Glucose, capillary     Status: Abnormal   Collection Time: 10/13/14  4:14 PM  Result Value Ref Range   Glucose-Capillary 124 (H) 65 - 99 mg/dL  Glucose, capillary     Status: Abnormal   Collection Time: 10/13/14  9:04 PM  Result Value Ref Range   Glucose-Capillary 125 (H) 65 - 99 mg/dL  Basic metabolic panel     Status: Abnormal   Collection Time: 10/13/14 10:44 PM  Result Value Ref Range   Sodium 145 135 - 145 mmol/L   Potassium 3.0 (L) 3.5 - 5.1 mmol/L   Chloride 107 101 - 111 mmol/L   CO2 29 22 - 32 mmol/L   Glucose, Bld 131 (H) 65 - 99 mg/dL   BUN 8 6 - 20 mg/dL   Creatinine, Ser 0.82 0.44 - 1.00 mg/dL   Calcium 8.2 (L) 8.9 - 10.3 mg/dL   GFR calc non Af Amer >60 >60 mL/min   GFR calc Af Amer >60 >60 mL/min   Anion gap 9 5 - 15  Basic metabolic  panel     Status: Abnormal   Collection Time: 10/14/14  4:29 AM  Result Value Ref Range   Sodium 143 135 - 145 mmol/L   Potassium 3.4 (L) 3.5 - 5.1 mmol/L   Chloride 104 101 -  111 mmol/L   CO2 29 22 - 32 mmol/L   Glucose, Bld 123 (H) 65 - 99 mg/dL   BUN 8 6 - 20 mg/dL   Creatinine, Ser 0.80 0.44 - 1.00 mg/dL   Calcium 8.0 (L) 8.9 - 10.3 mg/dL   GFR calc non Af Amer >60 >60 mL/min   GFR calc Af Amer >60 >60 mL/min   Anion gap 10 5 - 15  Glucose, capillary     Status: Abnormal   Collection Time: 10/14/14  7:12 AM  Result Value Ref Range   Glucose-Capillary 126 (H) 65 - 99 mg/dL  Glucose, capillary     Status: Abnormal   Collection Time: 10/14/14 11:09 AM  Result Value Ref Range   Glucose-Capillary 143 (H) 65 - 99 mg/dL   Comment 1 Notify RN   Magnesium     Status: None   Collection Time: 10/14/14 11:11 AM  Result Value Ref Range   Magnesium 1.9 1.7 - 2.4 mg/dL    Signed: Loleta Chance, MD 10/14/2014, 1:24 PM    Services Ordered on Discharge: Home health, PT, OT, nursing Equipment Ordered on Discharge: Hospital bed, wheelchair, hoyer lift

## 2014-10-14 NOTE — Progress Notes (Signed)
CM received call from [place] [facility] stating [name] is not answering to receive DME.  CM called [name] and she states she is home and ready to receive DME.  Cm called [facility] back to request PLEASE DELIVER and CM called RN to notify her ok to discharge.

## 2014-10-14 NOTE — Care Management Note (Addendum)
Case Management Note  Patient Details  Name: [name] MRN: [id number] Date of Birth: 1933/05/09  Subjective/Objective:                   Fall Action/Plan:  Discharge planning Expected Discharge Date:  10/15/14               Expected Discharge Plan:  [place]  In-House Referral:     Discharge planning Services  CM Consult  Post Acute Care Choice:  Home Health Choice offered to:  Adult Children  DME Arranged:  Hospital bed DME Agency:  [place]:    [place]:     Status of Service:     Medicare Important Message Given:    Date Medicare IM Given:    Medicare IM give by:    Date Additional Medicare IM Given:    Additional Medicare Important Message give by:     If discussed at [place] of Stay Meetings, dates discussed:    Additional Comments: CM received callback from daughter, [name] who requests hoyer lift, wheelchair, hospital bed.  CM spoke with MD who placed orders for HHPT/OT/RN/Aide and the DME orders.  CM called [facility] rep, [place] for [facility] services and DME rep, Merry Proud to please have equipment delivered to home with [facility] as contact for receipt.  All orders faxed to [facility] for confirmation. No other CM needs were communicated.  CM received call from MD to please arrange for hospital bed at home for pt.  MD states family is not interested in SNF.  CM met with pt who is unable to articulate her needs.  CM called daughter,  [name] Daughter [phone]  [phone]  To discuss discharge and am waiting callback.  CM has requested [place] orders and face to face.  CM has called [facility] DME rep, Merry Proud to please arrange delivery of hospital bed delivery.  Waiting for orders, face to face, and choice of home health agency by family. Dellie Catholic, RN 10/14/2014, 11:23 AM

## 2014-10-15 ENCOUNTER — Other Ambulatory Visit: Payer: Self-pay | Admitting: Internal Medicine

## 2014-10-15 DIAGNOSIS — R5383 Other fatigue: Secondary | ICD-10-CM

## 2014-10-15 DIAGNOSIS — E785 Hyperlipidemia, unspecified: Secondary | ICD-10-CM

## 2014-10-15 DIAGNOSIS — Z9181 History of falling: Secondary | ICD-10-CM

## 2014-10-15 LAB — GLUCOSE, CAPILLARY
GLUCOSE-CAPILLARY: 116 mg/dL — AB (ref 65–99)
Glucose-Capillary: 159 mg/dL — ABNORMAL HIGH (ref 65–99)

## 2014-10-15 NOTE — Consult Note (Signed)
   [facility] Inpatient Consult   10/15/2014  [name] Sep 22, 1933 [id number] Patient is currently active [prior to admission] with [facility] for chronic disease management services.  Patient has been engaged by a [employer] and LCSW.  Met with the patient at bedside and she confirm she would like to continue [facility] services.  She verbalized that the nurse should follow up with her daughter as well.  Our community based plan of care has focused on disease management and community resource support.  Patient will receive a post discharge transition of care call and will be evaluated for monthly home visits for assessments and disease process education.  Made Inpatient Case Manager aware that [facility] following. Of note, [facility] Care Management services does not replace or interfere with any services that are arranged by inpatient case management or social work.  For additional questions or referrals please contact: Natividad Brood, RN BSN [facility] Liaison  [phone] business mobile phone

## 2014-10-15 NOTE — Progress Notes (Signed)
Patient ID: [name], female   DOB: 1933-05-25, 81 y.o.   MRN: [id number]   Subjective: [name] was doing well this morning, better appetite, no complaints. She's ready to go home.  Objective: Vital signs in last 24 hours: Filed Vitals:   10/14/14 2043 10/14/14 2224 10/15/14 0548 10/15/14 0649  BP: 127/46 135/59 186/64 188/84  Pulse: 74  72 74  Temp: 98 F (36.7 C)  98.1 F (36.7 C)   TempSrc: Oral  Oral   Resp: 20  18   Height:      Weight:   93.8 kg (206 lb 12.7 oz)   SpO2: 95% 97% 95%    General: resting in bed eating breakfast HEENT: left pupil irregular, dilated, non-responsive to light, unchanged from yesterday Cardiac: RRR, no rubs, murmurs or gallops Pulm: clear to auscultation bilaterally, moving normal volumes of air Abd: soft, nontender, nondistended, BS present Ext: warm and well perfused, no pedal edema  Medications: I have reviewed the patient's current medications. Scheduled Meds: . amLODipine  5 mg Oral Daily  . aspirin EC  81 mg Oral Daily  . carvedilol  25 mg Oral BID WC  . chlorhexidine  15 mL Mouth/Throat BID  . enoxaparin (LOVENOX) injection  40 mg Subcutaneous Q24H  . feeding supplement (GLUCERNA SHAKE)  237 mL Oral BID BM  . FLUoxetine  10 mg Oral QHS  . insulin glargine  5 Units Subcutaneous QHS  . iron polysaccharides  150 mg Oral Daily  . levETIRAcetam  750 mg Oral BID  . lisinopril  40 mg Oral Daily  . pantoprazole  40 mg Oral Daily  . simvastatin  20 mg Oral q1800  . sodium chloride  3 mL Intravenous Q12H   Continuous Infusions:  PRN Meds:.diclofenac sodium, ondansetron **OR** ondansetron (ZOFRAN) IV   Assessment/Plan:  She was supposed to go home yesterday but there was a problem getting her a ride. She feels great this morning and has a better appetite. Her family will be by to get her this morning.  The patient does have a current PCP [name] Handy, MD) and does need an [facility] hospital follow-up appointment after discharge.  The  patient does not have transportation limitations that hinder transportation to clinic appointments.  .Services Needed at time of discharge: Y = Yes, Blank = No PT:   OT:   RN:   Equipment:   Other:     LOS: 3 days   Loleta Chance, MD 10/15/2014, 8:42 AM

## 2014-10-15 NOTE — Progress Notes (Signed)
TCT patient's daughter [name], hospital bed from [place] has been delivered today. Patient is to be transported home today via ambulance. Soc Worker is aware. Mindi Slicker [facility] [phone]

## 2014-10-15 NOTE — Progress Notes (Signed)
  Date: 10/15/2014  Patient name: CHAI VERDEJO  Medical record number: [id number]  Date of birth: March 03, 1933   This patient has been seen and the plan of care was discussed with the house staff. Please see their note for complete details. I concur with their findings with the following additions/corrections: Ms Nikkel was seen on AM rounds. She was to have been D/C'd yesterday but her DME was not able to be delivered until late. She is now ready for D/C.  Bartholomew Crews, MD 10/15/2014, 2:19 PM

## 2014-10-15 NOTE — Progress Notes (Signed)
Physical Therapy Treatment Patient Details Name: [name] MRN: [id number] DOB: 1933-12-16 Today's Date: 10/15/2014    History of Present Illness Patient is a 81 yo female admitted 10/12/14 following unwitnessed fall.  Patient was d/c'ed from hospital earlier in day of current admission day.   PMH:  DM, CVA, dementia, dysphagia, CHF, HTN, lung ca, meningioma s/p resection    PT Comments    Pt with excellent progression with mobility today, able to ambulate in hall, sit in chair and toilet with assist for pericare. REcommend HHPT for D/C with continued therapy acutely. Pt continues to demonstrate decreased cognition but able to follow commands and oriented to place and self.   Follow Up Recommendations  Home health PT;Supervision/Assistance - 24 hour     Equipment Recommendations       Recommendations for Other Services       Precautions / Restrictions Precautions Precautions: Fall    Mobility  Bed Mobility   Bed Mobility: Supine to Sit     Supine to sit: Min guard     General bed mobility comments: pt able to transfer to EOB with rail with cues and increased time  Transfers Overall transfer level: Needs assistance   Transfers: Sit to/from Stand Sit to Stand: Min assist;Mod assist         General transfer comment: min assist to stand from bed and mod assist to stand from lower height toilet with rail and cues for sequence with assist for anterior translation  Ambulation/Gait Ambulation/Gait assistance: Min guard Ambulation Distance (Feet): 75 Feet Assistive device: Rolling walker (2 wheeled) Gait Pattern/deviations: Step-through pattern;Decreased stride length;Trunk flexed   Gait velocity interpretation: Below normal speed for age/gender General Gait Details: pt walked 15' to toilet, then 1' with chair to follow for fatigue with cues for posture and position in RW   Stairs            Wheelchair Mobility    Modified Rankin (Stroke Patients  Only)       Balance Overall balance assessment: Needs assistance   Sitting balance-Leahy Scale: Good       Standing balance-Leahy Scale: Poor                      Cognition Arousal/Alertness: Awake/alert Behavior During Therapy: Flat affect     Orientation Level: Disoriented to;Time                  Exercises      General Comments        Pertinent Vitals/Pain Pain Assessment: No/denies pain    Home Living                      Prior Function            PT Goals (current goals can now be found in the care plan section) Progress towards PT goals: Goals met and updated - see care plan    Frequency       PT Plan Discharge plan needs to be updated    Co-evaluation             End of Session Equipment Utilized During Treatment: Gait belt Activity Tolerance: Patient tolerated treatment well Patient left: in chair;with call bell/phone within reach;with chair alarm set     Time: 9518-8416 PT Time Calculation (min) (ACUTE ONLY): 25 min  Charges:  $Gait Training: 8-22 mins $Therapeutic Activity: 8-22 mins  G CodesMelford Aase October 30, 2014, 11:11 AM Elwyn Reach, [place]

## 2014-10-15 NOTE — Progress Notes (Signed)
CSW (Clinical Education officer, museum) notified pt will need non-emergent ambulance home. CSW confirmed address and notified pt/pt family that transportation is not guaranteed to be covered by insurance. Pt daughter confirmed she is home and ready to receive pt. CSW notified nursing that transportation has been arranged. CSW signing off.  [place], [place]

## 2014-10-15 NOTE — Care Management Important Message (Signed)
Important Message  Patient Details  Name: [name] MRN: [id number] Date of Birth: 1933/04/23   Medicare Important Message Given:       Pricilla Handler 10/15/2014, 3:31 PM

## 2014-10-16 ENCOUNTER — Other Ambulatory Visit: Payer: Self-pay | Admitting: *Deleted

## 2014-10-16 ENCOUNTER — Telehealth: Payer: Self-pay | Admitting: Internal Medicine

## 2014-10-16 DIAGNOSIS — I10 Essential (primary) hypertension: Secondary | ICD-10-CM | POA: Diagnosis not present

## 2014-10-16 DIAGNOSIS — Z794 Long term (current) use of insulin: Secondary | ICD-10-CM | POA: Diagnosis not present

## 2014-10-16 DIAGNOSIS — F0390 Unspecified dementia without behavioral disturbance: Secondary | ICD-10-CM | POA: Diagnosis not present

## 2014-10-16 DIAGNOSIS — G40909 Epilepsy, unspecified, not intractable, without status epilepticus: Secondary | ICD-10-CM | POA: Diagnosis not present

## 2014-10-16 DIAGNOSIS — Z85841 Personal history of malignant neoplasm of brain: Secondary | ICD-10-CM | POA: Diagnosis not present

## 2014-10-16 DIAGNOSIS — Z8673 Personal history of transient ischemic attack (TIA), and cerebral infarction without residual deficits: Secondary | ICD-10-CM | POA: Diagnosis not present

## 2014-10-16 DIAGNOSIS — Z85118 Personal history of other malignant neoplasm of bronchus and lung: Secondary | ICD-10-CM | POA: Diagnosis not present

## 2014-10-16 DIAGNOSIS — E11329 Type 2 diabetes mellitus with mild nonproliferative diabetic retinopathy without macular edema: Secondary | ICD-10-CM | POA: Diagnosis not present

## 2014-10-16 DIAGNOSIS — Z9181 History of falling: Secondary | ICD-10-CM | POA: Diagnosis not present

## 2014-10-16 DIAGNOSIS — Z87891 Personal history of nicotine dependence: Secondary | ICD-10-CM | POA: Diagnosis not present

## 2014-10-16 DIAGNOSIS — I509 Heart failure, unspecified: Secondary | ICD-10-CM | POA: Diagnosis not present

## 2014-10-16 NOTE — Patient Outreach (Signed)
Call received from [facility] daughter, Ms. Maisie Fus, stating that the member had been admitted in the hospital and is now discharged.  She states that was told to contact this care manager to re-open case.  Ms. Maisie Fus was notified that the case was not closed, and that this care manager will be starting a transition of care program for member.  Daughter states that the member is now receiving home health and is now sleeping in a hospital bed.  She inquires about facilities available for member to be involved in on a daily basis to offer more activity and interaction with public.  This care manager made Ms. Cummings aware that the social worker would be contacted and request made to call to assess for further needs.    Ms. Maisie Fus questions about a medication that was changed (old medication stated take 2 pills daily, new prescription says take 4 pills daily), she does not state which medication it is.  She questions if she is to administer all 4 pills at the same time or if she is to give 2 pills at 2 different times.  Ms. Maisie Fus made aware that this care manager was not at a place to open member's chart to verify, but will provide call back once arrived at destination.  Ms. Robin Searing understanding.  Valente David, BSN, [place] [facility]  [facility] Care Manager [phone]

## 2014-10-16 NOTE — Telephone Encounter (Signed)
Call to patient to confirm appointment for 10/17/14 at 10:45 mail box full

## 2014-10-16 NOTE — Patient Outreach (Signed)
Call placed back to member's daughter to continue with transition of care initiation.  No answer, mailbox is full, unable to leave a message.  Will await call back and make another attempt to contact tomorrow.  Valente David, BSN, [place] [facility]  [facility] Care Manager [phone]

## 2014-10-17 ENCOUNTER — Other Ambulatory Visit: Payer: Self-pay | Admitting: Pharmacist

## 2014-10-17 ENCOUNTER — Ambulatory Visit: Payer: Medicare Other | Admitting: Internal Medicine

## 2014-10-17 ENCOUNTER — Other Ambulatory Visit: Payer: Self-pay | Admitting: *Deleted

## 2014-10-17 DIAGNOSIS — I509 Heart failure, unspecified: Secondary | ICD-10-CM | POA: Diagnosis not present

## 2014-10-17 DIAGNOSIS — Z9181 History of falling: Secondary | ICD-10-CM | POA: Diagnosis not present

## 2014-10-17 DIAGNOSIS — D509 Iron deficiency anemia, unspecified: Secondary | ICD-10-CM

## 2014-10-17 DIAGNOSIS — I10 Essential (primary) hypertension: Secondary | ICD-10-CM | POA: Diagnosis not present

## 2014-10-17 DIAGNOSIS — G40909 Epilepsy, unspecified, not intractable, without status epilepticus: Secondary | ICD-10-CM | POA: Diagnosis not present

## 2014-10-17 DIAGNOSIS — E11329 Type 2 diabetes mellitus with mild nonproliferative diabetic retinopathy without macular edema: Secondary | ICD-10-CM | POA: Diagnosis not present

## 2014-10-17 DIAGNOSIS — F0390 Unspecified dementia without behavioral disturbance: Secondary | ICD-10-CM | POA: Diagnosis not present

## 2014-10-17 NOTE — Patient Outreach (Signed)
Call placed back to member's daughter to complete transition of care assessment.  Daughter states that the member has been doing "ok" since discharge and that she has had several people coming to the home daily to attend to the [facility] needs.  She reports that the member receives nursing, nursing aide, and physical therapy.  She states that they mentioned several items that she is in need of, which this care manager will contact the social worker with an update (bedside table, depends, and disposable pads for the bed).    Daughter reports not having 3 of the medications on the list because they weren't available at the pharmacy (Tylenol #3, Prozac and Ferrex).  She reports that she will try again to obtain these meds.  She reports no problem with other rmeds.    Daughter informed of appointment with PCP that was scheduled for today, she states that she was not aware.  Instructed to call to reschedule as soon as possible.  Denies any further concerns at this time.  Encouraged to contact this care manager with any questions.  Will continue with transition of care program next week, making a home visit next week then moving forward with weekly calls.  Valente David, BSN, [place] Management  [facility] Care Manager [phone]

## 2014-10-18 DIAGNOSIS — E11329 Type 2 diabetes mellitus with mild nonproliferative diabetic retinopathy without macular edema: Secondary | ICD-10-CM | POA: Diagnosis not present

## 2014-10-18 DIAGNOSIS — Z9181 History of falling: Secondary | ICD-10-CM | POA: Diagnosis not present

## 2014-10-18 DIAGNOSIS — I509 Heart failure, unspecified: Secondary | ICD-10-CM | POA: Diagnosis not present

## 2014-10-18 DIAGNOSIS — I10 Essential (primary) hypertension: Secondary | ICD-10-CM | POA: Diagnosis not present

## 2014-10-18 DIAGNOSIS — F0390 Unspecified dementia without behavioral disturbance: Secondary | ICD-10-CM | POA: Diagnosis not present

## 2014-10-18 DIAGNOSIS — G40909 Epilepsy, unspecified, not intractable, without status epilepticus: Secondary | ICD-10-CM | POA: Diagnosis not present

## 2014-10-18 MED ORDER — AMLODIPINE BESY-BENAZEPRIL HCL 5-40 MG PO CAPS: 1.0000 | ORAL_CAPSULE | Freq: Every day | ORAL | Status: DC

## 2014-10-18 MED ORDER — POLYSACCHARIDE IRON COMPLEX 150 MG PO CAPS: 150.0000 mg | ORAL_CAPSULE | Freq: Every day | ORAL | Status: DC

## 2014-10-18 NOTE — Addendum Note (Signed)
Addended by: Forde Dandy on: 10/18/2014 02:37 PM   Modules accepted: Orders, Medications

## 2014-10-19 DIAGNOSIS — I509 Heart failure, unspecified: Secondary | ICD-10-CM | POA: Diagnosis not present

## 2014-10-19 DIAGNOSIS — G40909 Epilepsy, unspecified, not intractable, without status epilepticus: Secondary | ICD-10-CM | POA: Diagnosis not present

## 2014-10-19 DIAGNOSIS — E11329 Type 2 diabetes mellitus with mild nonproliferative diabetic retinopathy without macular edema: Secondary | ICD-10-CM | POA: Diagnosis not present

## 2014-10-19 DIAGNOSIS — I10 Essential (primary) hypertension: Secondary | ICD-10-CM | POA: Diagnosis not present

## 2014-10-19 DIAGNOSIS — Z9181 History of falling: Secondary | ICD-10-CM | POA: Diagnosis not present

## 2014-10-19 DIAGNOSIS — F0390 Unspecified dementia without behavioral disturbance: Secondary | ICD-10-CM | POA: Diagnosis not present

## 2014-10-20 ENCOUNTER — Telehealth: Payer: Self-pay | Admitting: Internal Medicine

## 2014-10-20 NOTE — Telephone Encounter (Signed)
   Reason for call:   I received a call from [name] at 9.15 PM indicating that her mother was having runny eyes- she describes clear water coming out of her mothers Right eye. She says this has happened before, then she had cataract and was given some eye drops. She also says he has pain in her right eye only when the light is on, this started today, and is mild. Her vision is fine in both eyes. I told daughter to close the normal eye and ask her mother if she could see from the affected eye, and mother said yes. She also has redness of her eye, but daughter says her mothers eye is always red.  Pt is otherwise okay, without fever, chills or malaise, no headaches, neck stiffness, no halos around lights, no nausea or vomiting, no severe eye pain.   Pertinent Data:   Last eye  Exam- 04/2013- Mild non prolif Diabetic retinopathy, with macular edema. She was supposed to follow up with an ophthalmologist. No mention of glaucoma.   Assessment / Plan / Recommendations:   Patient's daughter told that mother does not need to be seen immediately she can come to the clinic to be seen next week. [facility] will not be open till Tuesday.  Also told if patient experiences any change in her vision, severe headaches, eye pain, nausea or vomiting then she needs to come for urgent evaluation in the ED.    [name] Roys, MD   10/20/2014, 9:23 PM

## 2014-10-22 ENCOUNTER — Telehealth: Payer: Self-pay | Admitting: Internal Medicine

## 2014-10-22 NOTE — Telephone Encounter (Signed)
  INTERNAL MEDICINE RESIDENCY PROGRAM After-Hours Telephone Call    Reason for call:   I received a call from [name] at 10:06 AM, 10/22/2014 indicating she is having significant leg pain.    Pertinent Data:   Patient was recently discharged from the hospital on 10/14/14 after mechanical fall. She was sent home with ambulance but daughter states that she did not get any AVS to give her instruction about her medication.  She is worried about her mom's pain not being controlled. She used to be on Tylenol #3 before but she ran out before coming in the hospital. She did not have any left after discharge to take for her pain.  She has hx of GI bleed, but no kidney abnormalities. She currently is not taking anything for the pain as she does not have the Tylenol #3 and did not know what would be safe for her to take.  She also does not have a clinic appt schedule as far as I can tell from [place].     Assessment / Plan / Recommendations:   I stated to the daughter that since the clinic is closed, we cannot give her any Tylenol #3 script now. I ask her to let her mom take OTC Tylenol '500mg'$  q6hr PRN as needed for her pain. i told her that I would try to get her in the clinic urgently for follow up and also for pain evaluation.  As always, pt is advised that if symptoms worsen or new symptoms arise, they should go to an urgent care facility or to to ER for further evaluation.    Dellia Nims, MD   10/22/2014, 10:06 AM

## 2014-10-23 ENCOUNTER — Ambulatory Visit: Payer: Medicare Other | Admitting: Neurology

## 2014-10-23 DIAGNOSIS — Z9181 History of falling: Secondary | ICD-10-CM | POA: Diagnosis not present

## 2014-10-23 DIAGNOSIS — I10 Essential (primary) hypertension: Secondary | ICD-10-CM | POA: Diagnosis not present

## 2014-10-23 DIAGNOSIS — F0390 Unspecified dementia without behavioral disturbance: Secondary | ICD-10-CM | POA: Diagnosis not present

## 2014-10-23 DIAGNOSIS — G40909 Epilepsy, unspecified, not intractable, without status epilepticus: Secondary | ICD-10-CM | POA: Diagnosis not present

## 2014-10-23 DIAGNOSIS — I509 Heart failure, unspecified: Secondary | ICD-10-CM | POA: Diagnosis not present

## 2014-10-23 DIAGNOSIS — E11329 Type 2 diabetes mellitus with mild nonproliferative diabetic retinopathy without macular edema: Secondary | ICD-10-CM | POA: Diagnosis not present

## 2014-10-24 ENCOUNTER — Telehealth: Payer: Self-pay | Admitting: Internal Medicine

## 2014-10-24 DIAGNOSIS — I509 Heart failure, unspecified: Secondary | ICD-10-CM | POA: Diagnosis not present

## 2014-10-24 DIAGNOSIS — G40909 Epilepsy, unspecified, not intractable, without status epilepticus: Secondary | ICD-10-CM | POA: Diagnosis not present

## 2014-10-24 DIAGNOSIS — E11329 Type 2 diabetes mellitus with mild nonproliferative diabetic retinopathy without macular edema: Secondary | ICD-10-CM | POA: Diagnosis not present

## 2014-10-24 DIAGNOSIS — Z9181 History of falling: Secondary | ICD-10-CM | POA: Diagnosis not present

## 2014-10-24 DIAGNOSIS — F0390 Unspecified dementia without behavioral disturbance: Secondary | ICD-10-CM | POA: Diagnosis not present

## 2014-10-24 DIAGNOSIS — I10 Essential (primary) hypertension: Secondary | ICD-10-CM | POA: Diagnosis not present

## 2014-10-24 NOTE — Telephone Encounter (Signed)
Pt daughter called requesting tylenol 3 to be filled. Please call pt daughter back.

## 2014-10-25 ENCOUNTER — Other Ambulatory Visit: Payer: Self-pay | Admitting: *Deleted

## 2014-10-25 ENCOUNTER — Telehealth: Payer: Self-pay | Admitting: *Deleted

## 2014-10-25 ENCOUNTER — Other Ambulatory Visit: Payer: Self-pay | Admitting: Internal Medicine

## 2014-10-25 DIAGNOSIS — F0390 Unspecified dementia without behavioral disturbance: Secondary | ICD-10-CM | POA: Diagnosis not present

## 2014-10-25 DIAGNOSIS — F329 Major depressive disorder, single episode, unspecified: Secondary | ICD-10-CM

## 2014-10-25 DIAGNOSIS — G40909 Epilepsy, unspecified, not intractable, without status epilepticus: Secondary | ICD-10-CM | POA: Diagnosis not present

## 2014-10-25 DIAGNOSIS — E11329 Type 2 diabetes mellitus with mild nonproliferative diabetic retinopathy without macular edema: Secondary | ICD-10-CM | POA: Diagnosis not present

## 2014-10-25 DIAGNOSIS — I10 Essential (primary) hypertension: Secondary | ICD-10-CM | POA: Diagnosis not present

## 2014-10-25 DIAGNOSIS — I509 Heart failure, unspecified: Secondary | ICD-10-CM | POA: Diagnosis not present

## 2014-10-25 DIAGNOSIS — Z9181 History of falling: Secondary | ICD-10-CM | POA: Diagnosis not present

## 2014-10-25 DIAGNOSIS — F32A Depression, unspecified: Secondary | ICD-10-CM

## 2014-10-25 MED ORDER — FLUOXETINE HCL 10 MG PO CAPS: 10.0000 mg | ORAL_CAPSULE | Freq: Every day | ORAL | Status: DC

## 2014-10-25 NOTE — Telephone Encounter (Signed)
Pt daughter called asking for refill on Fluoxetine 10 mg  I see that it was stopped because she was not taking.  Daughter states she still takes this med. Will you refill?

## 2014-10-25 NOTE — Telephone Encounter (Signed)
Monica lane from [facility] calls and states she is in the home, goes over med list, the meds ordered on 9/1- amolidipine/ benazepril 5/40 and the ferrex150, the family state they have not been told about these 2 meds and have not been told to pick them up. She is continuing to take lisinopril but with concerns because of lower leg edema. Also as stated below the fluoxetine is being taken by the pt and is noted on her disch instructions to take but on med list it does not appear and shows in hx that it was stopped in April due to pt not taking i have made pt an appt w/ dr patel for tues and ms lane is assisting pt's family in arranging PTAR for transport to and from hospital Pt's family would like a ramp at door so i will send to [place] for f/u w/ [facility], she may also need more equipment to aid in home care Please advise on meds

## 2014-10-25 NOTE — Patient Outreach (Signed)
[facility]) Care Management   10/25/2014  [name] 01-22-1934 [id number]  [name] is an 81 y.o. female  Subjective:   Objective:   Review of Systems  Constitutional: Negative.   HENT: Positive for hearing loss.   Eyes: Negative.   Respiratory: Negative.   Cardiovascular: Negative.   Gastrointestinal: Negative.   Genitourinary: Negative.   Musculoskeletal: Negative.   Skin: Negative.   Neurological: Negative.   Endo/Heme/Allergies: Negative.   Psychiatric/Behavioral: Positive for memory loss.    Physical Exam  Constitutional: She appears well-developed.  Neck: Normal range of motion.  Cardiovascular: Normal rate, regular rhythm and normal heart sounds.   Respiratory: Effort normal and breath sounds normal.  GI: Soft. Bowel sounds are normal.  Musculoskeletal: Normal range of motion.  Neurological: She is alert.  Skin: Skin is warm and dry.   BP 132/82 mmHg  Pulse 76  Resp 18  SpO2 98%   Current Medications:   Current Outpatient Prescriptions  Medication Sig Dispense Refill  . acetaminophen-codeine (TYLENOL #3) 300-30 MG per tablet Take 1 tablet by mouth every 6 (six) hours as needed for moderate pain. 90 tablet 0  . amLODipine-benazepril (LOTREL) 5-40 MG per capsule Take 1 capsule by mouth daily. 30 capsule 2  . aspirin EC 81 MG tablet Take 81 mg by mouth daily.    . carvedilol (COREG) 25 MG tablet Take 1 tablet (25 mg total) by mouth 2 (two) times daily with a meal. 180 tablet 4  . chlorhexidine (PERIDEX) 0.12 % solution Use as directed 15 mLs in the mouth or throat 2 (two) times daily.    . diclofenac sodium (VOLTAREN) 1 % GEL Apply 4 g topically 4 (four) times daily. (Patient taking differently: Apply 4 g topically 4 (four) times daily as needed (pain). ) 100 g 1  . feeding supplement, GLUCERNA SHAKE, (GLUCERNA SHAKE) LIQD Take 237 mLs by mouth 2 (two) times daily between meals.    Marland Kitchen glucose blood (ACCU-CHEK AVIVA) test strip Use  to Check Blood Sugars 3 Times Daily.Dx Code: 250.00. Insulin dependent 100 each 12  . insulin aspart (NOVOLOG FLEXPEN) 100 UNIT/ML FlexPen Novolog Insulin: 91 - 160 5 units 161- 230 7 units 231-300 9 units 301- 370 11 units 371 - 440 13 units 441 - 510 15 units 15 mL 11  . Insulin Glargine (LANTUS SOLOSTAR) 100 UNIT/ML Solostar Pen Inject 17 Units into the skin at bedtime. (Patient taking differently: Inject 5 Units into the skin daily at 10 pm. ) 10 mL 2  . iron polysaccharides (FERREX 150) 150 MG capsule Take 1 capsule (150 mg total) by mouth daily. 30 capsule 3  . levETIRAcetam (KEPPRA) 750 MG tablet Take 1 tablet (750 mg total) by mouth 2 (two) times daily. 60 tablet 11  . omeprazole (PRILOSEC) 20 MG capsule Take 1 capsule (20 mg total) by mouth daily. 30 capsule 3  . simvastatin (ZOCOR) 20 MG tablet TAKE 1 TABLET BY MOUTH EVERY DAY 90 tablet 3   No current facility-administered medications for this visit.    Functional Status:   In your present state of health, do you have any difficulty performing the following activities: 10/13/2014 09/03/2014  Hearing? N Y  Vision? N Y  Difficulty concentrating or making decisions? [name]  Walking or climbing stairs? Y Y  Dressing or bathing? Y N  Doing errands, shopping? [name]  Preparing Food and eating ? - Y  Using the Toilet? - N  In the  past six months, have you accidently leaked urine? - Y  Do you have problems with loss of bowel control? - N  Managing your Medications? - Y  Managing your Finances? - Y  Housekeeping or managing your Housekeeping? - Y    Fall/Depression Screening:    PHQ 2/9 Scores 09/03/2014 07/20/2014 07/03/2014 06/07/2014 03/23/2014 03/06/2014 01/30/2014  PHQ - 2 Score 0 0 0 0 0 0 0    Assessment:    Upon arrival, member receiving therapy from [facility].  She also receives occupational therapy, and assistance from a certified nurse assistant twice a week.  Member denies any pain or  discomfort at this time. She is alert, but only able to states her name and date of birth.  Discussed status with daughter, [name], and she states that the member is doing a lot better than she was prior to her admission.  Daughter states that the member has an assessment with [facility] this afternoon.  Advantages discussed with daughter, she is made aware that if accepted into the program, [facility] program would stop and she would have to change the [facility] primary care physician.  Daughter verbalizes understanding, and states that according to her research and information provided by this care manager that it would be the best program for the member.    Medications reviewed, member still does not have Tylenol #3, Ferrex, Lisinopril, Fluoxetine, and Amlodipine.  Physician office called, prescription for Tylenol # 3 and Ferrex sent to pharmacy.  Spoke with Bonnita Nasuti, she states that the member was changed from Lisinopril and Amlodipine to Lotrel, and that her Fluoxetine was discontinued in April.  Bonnita Nasuti made aware that all of the missing medications were on the most recent discharge instructions as current medications.  Dr. Marijean Bravo paged by Bonnita Nasuti, she will notify this care manager when medications are clarified.  Member missed follow up appointment with PCP, appointment rescheduled for next week.  Daughter expresses concern about being able to get member out of the home (she uses a rolling walker and wheelchair, does not have a ramp or railing on the porch).  [facility] called for transportation, unable to arrange today, will have to call the day before.  Information provided to daughter, she states that she will contact them on Monday.  Daughter expresses concern regarding member's hearing.  Encouraged to speak with physician on Tuesday to set up an appointment with the audiologist.    Daughter denies any further questions at this time.  Encouraged to  contact this care manager with any concerns.  Plan:   Will await for call back from Dr. Barbera Setters nurse. Will continue with transition of care calls next week.  THN CM Care Plan Problem One        Most Recent Value   Care Plan Problem One  Elevated A1C (10.3)   Role Documenting the Problem One  Care Management Coordinator   Care Plan for Problem One  Active   THN Long Term Goal (31-90 days)  Member's A1C will decrease below desired range within the next 90 days   THN Long Term Goal Start Date  09/03/14   THN CM Short Term Goal #1 (0-30 days)  Member's daughter will monitor blood sugars on a daily basis and record readings within the next 4 weeks   THN CM Short Term Goal #1 Start Date  10/17/14 [Not met, rescheduled]   Interventions for Short Term Goal #1  Discusses the importance of monitoring daily blood sugars and recording to visualize a trend, and report any abnormal readings to physician   THN CM Short Term Goal #2 (0-30 days)  Member will take medications as prescribed within the next 4 weeks   THN CM Short Term Goal #2 Start Date  10/17/14 [Not met, rescheduled]   Interventions for Short Term Goal #2  Discussed the importance of taking medications as prescribed   THN CM Short Term Goal #3 (0-30 days)  Member and family will monitor diet and be sure to follow diabetic and heart healthy diet over he next 4 weeks   THN CM Short Term Goal #3 Start Date  10/17/14 [Not met, rescheduled]   Interventions for Short Tern Goal #3  Discussed the importance of following proper diet (low carbs, low sodium), EMMI assigned.    THN CM Care Plan Problem Two        Most Recent Value   Care Plan Problem Two  Hypertension   Role Documenting the Problem Two  Care Management Coordinator   Care Plan for Problem Two  Active   THN CM Short Term Goal #1 (0-30 days)  Member's daughter will moitor member's blood pressure daily and record readings within the next 4 weeks   THN CM Short Term Goal #1 Start Date   10/17/14 [Not met, rescheduled]   Interventions for Short Term Goal #2   Discussed the importance of monitoring blood pressure to provide accurate trends to physician for medication management    [facility] CM Care Plan Problem Three        Most Recent Value   Care Plan Problem Three  Recent hospitalization   Role Documenting the Problem Three  Care Management Coordinator   Care Plan for Problem Three  Active   THN Long Term Goal (31-90) days  Member will not be readmitted to the hospital within the next 31 days   THN Long Term Goal Start Date  10/16/14   Interventions for Problem Three Long Term Goal  Discussed importance in following all discharge instructions to decrease risk of readmission: diet, medications, follow up visit     Valente David, BSN, [place] [phone]

## 2014-10-26 DIAGNOSIS — F0390 Unspecified dementia without behavioral disturbance: Secondary | ICD-10-CM | POA: Diagnosis not present

## 2014-10-26 DIAGNOSIS — Z9181 History of falling: Secondary | ICD-10-CM | POA: Diagnosis not present

## 2014-10-26 DIAGNOSIS — I10 Essential (primary) hypertension: Secondary | ICD-10-CM | POA: Diagnosis not present

## 2014-10-26 DIAGNOSIS — I509 Heart failure, unspecified: Secondary | ICD-10-CM | POA: Diagnosis not present

## 2014-10-26 DIAGNOSIS — G40909 Epilepsy, unspecified, not intractable, without status epilepticus: Secondary | ICD-10-CM | POA: Diagnosis not present

## 2014-10-26 DIAGNOSIS — E11329 Type 2 diabetes mellitus with mild nonproliferative diabetic retinopathy without macular edema: Secondary | ICD-10-CM | POA: Diagnosis not present

## 2014-10-29 DIAGNOSIS — I509 Heart failure, unspecified: Secondary | ICD-10-CM | POA: Diagnosis not present

## 2014-10-29 DIAGNOSIS — I10 Essential (primary) hypertension: Secondary | ICD-10-CM | POA: Diagnosis not present

## 2014-10-29 DIAGNOSIS — Z9181 History of falling: Secondary | ICD-10-CM | POA: Diagnosis not present

## 2014-10-29 DIAGNOSIS — G40909 Epilepsy, unspecified, not intractable, without status epilepticus: Secondary | ICD-10-CM | POA: Diagnosis not present

## 2014-10-29 DIAGNOSIS — F0390 Unspecified dementia without behavioral disturbance: Secondary | ICD-10-CM | POA: Diagnosis not present

## 2014-10-29 DIAGNOSIS — E11329 Type 2 diabetes mellitus with mild nonproliferative diabetic retinopathy without macular edema: Secondary | ICD-10-CM | POA: Diagnosis not present

## 2014-10-30 ENCOUNTER — Ambulatory Visit (INDEPENDENT_AMBULATORY_CARE_PROVIDER_SITE_OTHER): Payer: Medicare Other | Admitting: Internal Medicine

## 2014-10-30 ENCOUNTER — Encounter: Payer: Self-pay | Admitting: Internal Medicine

## 2014-10-30 VITALS — BP 145/94 | HR 66 | Temp 97.9°F

## 2014-10-30 DIAGNOSIS — H9190 Unspecified hearing loss, unspecified ear: Secondary | ICD-10-CM | POA: Insufficient documentation

## 2014-10-30 DIAGNOSIS — I10 Essential (primary) hypertension: Secondary | ICD-10-CM | POA: Diagnosis not present

## 2014-10-30 DIAGNOSIS — H9193 Unspecified hearing loss, bilateral: Secondary | ICD-10-CM

## 2014-10-30 DIAGNOSIS — E11329 Type 2 diabetes mellitus with mild nonproliferative diabetic retinopathy without macular edema: Secondary | ICD-10-CM | POA: Diagnosis not present

## 2014-10-30 DIAGNOSIS — M1711 Unilateral primary osteoarthritis, right knee: Secondary | ICD-10-CM | POA: Insufficient documentation

## 2014-10-30 DIAGNOSIS — F0390 Unspecified dementia without behavioral disturbance: Secondary | ICD-10-CM | POA: Diagnosis not present

## 2014-10-30 DIAGNOSIS — G8929 Other chronic pain: Secondary | ICD-10-CM | POA: Diagnosis present

## 2014-10-30 DIAGNOSIS — N189 Chronic kidney disease, unspecified: Secondary | ICD-10-CM | POA: Diagnosis not present

## 2014-10-30 DIAGNOSIS — I509 Heart failure, unspecified: Secondary | ICD-10-CM | POA: Diagnosis not present

## 2014-10-30 DIAGNOSIS — M25561 Pain in right knee: Secondary | ICD-10-CM

## 2014-10-30 DIAGNOSIS — Z9114 Patient's other noncompliance with medication regimen: Secondary | ICD-10-CM | POA: Diagnosis not present

## 2014-10-30 DIAGNOSIS — Z9181 History of falling: Secondary | ICD-10-CM | POA: Diagnosis not present

## 2014-10-30 DIAGNOSIS — G40909 Epilepsy, unspecified, not intractable, without status epilepticus: Secondary | ICD-10-CM | POA: Diagnosis not present

## 2014-10-30 MED ORDER — IBUPROFEN 400 MG PO TABS: 400.0000 mg | ORAL_TABLET | Freq: Four times a day (QID) | ORAL | Status: DC | PRN

## 2014-10-30 NOTE — Assessment & Plan Note (Signed)
Patient with chronic right knee pain likely due to age related osteoarthritis. Xray from 09/2014 and 2004 show patellar spurs, diffuse osseous demineralization, mild degenerative change w/ loss of joint space. Patient has been taking Tylenol #3 300-30 mg every 6 hours prn for pain as well as Voltaren gel without significant relief. On exam, right knee is without swelling, effusion, fluctuance, tenderness to palpation, erythema, or hot to touch. ROM is intact.  -Start Ibuprofen 400 mg every 6 hours as needed for pain, alternate with Tylenol to have coverage for breakthrough pain every 3 hours -Continue Tylenol # 3 300-30 mg q6h prn for pain -Continue Voltaren gel -Advised to use heat or ice packs as needed

## 2014-10-30 NOTE — Assessment & Plan Note (Signed)
Patient with decreased hearing bilaterally. -Referral to Audiology

## 2014-10-30 NOTE — Progress Notes (Signed)
Patient ID: [name], female   DOB: 08-Nov-1933, 81 y.o.   MRN: [id number]   Subjective:   Patient ID: [name] female   DOB: [birth year] 81 y.o.   MRN: [id number]  HPI: [name] is a 81 y.o. with PMH as listed below who presents for HFU after admission for fall 2/2 to deconditioning. Patient is accompanied with daughter who provides most of the history.  Patient denies any falls since discharge from hospital and appetite is improved. She was sent home with hospital bed, wheelchair, Harrel Lemon lift, home health, and PT/OT, all of which she has received.  Patient has current complaint of chronic right knee pain and decreased hearing.  Please see problem list for details.   Past Medical History  Diagnosis Date  . Diabetes mellitus 2007    HgA1C (02/20/2010) = 9.2, HgA1C (03/20/2009) = 12.1  . CVA (cerebrovascular accident) 9735     right embolic stroke, no residual deficits  . Diverticulitis   . CVA (cerebral infarction) 7-yrs ago  . Arthritis   . Dysphagia   . CHF (congestive heart failure)   . DIABETES MELLITUS, TYPE II 12/04/2005  . HYPERLIPIDEMIA 12/04/2005  . HYPERTENSION 12/04/2005  . GERD 12/04/2005  . ARTHRITIS, KNEE 03/24/2006  . Lung mass 07/08/2010  . Meningioma 07/21/2010  . Hemangioma of liver 12/02/2010  . Depression 12/02/2010  . Adenocarcinoma of lung      Right upper lobe adenocarcinoma. s/p right lower lobectomy 12/15/10  . 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 acetaminophen-codeine (TYLENOL #3) 300-30 MG per tablet Take 1 tablet by mouth every 6 (six) hours as needed for moderate pain. 90 tablet 0  . amLODipine-benazepril (LOTREL) 5-40 MG per capsule Take 1 capsule by mouth daily. 30 capsule 2  . aspirin EC 81 MG tablet Take 81 mg by mouth daily.    . carvedilol (COREG) 25 MG tablet Take 1 tablet (25 mg total) by mouth 2 (two) times daily with a  meal. 180 tablet 4  . diclofenac sodium (VOLTAREN) 1 % GEL Apply 4 g topically 4 (four) times daily. (Patient taking differently: Apply 4 g topically 4 (four) times daily as needed (pain). ) 100 g 1  . feeding supplement, GLUCERNA SHAKE, (GLUCERNA SHAKE) LIQD Take 237 mLs by mouth 2 (two) times daily between meals.    Marland Kitchen FLUoxetine (PROZAC) 10 MG capsule Take 1 capsule (10 mg total) by mouth daily. 30 capsule 2  . glucose blood (ACCU-CHEK AVIVA) test strip Use to Check Blood Sugars 3 Times Daily.Dx Code: 250.00. Insulin dependent 100 each 12  . insulin aspart (NOVOLOG FLEXPEN) 100 UNIT/ML FlexPen Novolog Insulin: 91 - 160 5 units 161- 230 7 units 231-300 9 units 301- 370 11 units 371 - 440 13 units 441 - 510 15 units 15 mL 11  . Insulin Glargine (LANTUS SOLOSTAR) 100 UNIT/ML Solostar Pen Inject 17 Units into the skin at bedtime. (Patient taking differently: Inject 5 Units into the skin daily at 10 pm. ) 10 mL 2  . iron polysaccharides (FERREX 150) 150 MG capsule Take 1 capsule (150 mg total) by mouth daily. 30 capsule 3  . levETIRAcetam (KEPPRA) 750 MG tablet Take 1 tablet (750 mg total) by mouth 2 (two) times daily. 60 tablet 11  . omeprazole (PRILOSEC) 20 MG capsule Take 1 capsule (20 mg total) by mouth daily. 30 capsule 3  .  simvastatin (ZOCOR) 20 MG tablet TAKE 1 TABLET BY MOUTH EVERY DAY 90 tablet 3  . chlorhexidine (PERIDEX) 0.12 % solution Use as directed 15 mLs in the mouth or throat 2 (two) times daily.    Marland Kitchen ibuprofen (ADVIL,MOTRIN) 400 MG tablet Take 1 tablet (400 mg total) by mouth every 6 (six) hours as needed. 120 tablet 2   No current facility-administered medications for this visit.   Family History  Problem Relation Age of Onset  . Hyperlipidemia Brother   . Hypertension Brother   . Diabetes Brother    Social History   Social History  . Marital Status: Widowed    Spouse Name: N/A  . Number of Children: 5  . Years of Education: 10   Occupational History    .  Unemployed   Social History Main Topics  . Smoking status: Former Smoker    Types: Cigarettes    Quit date: 02/17/2000  . Smokeless tobacco: Never Used  . Alcohol Use: No  . Drug Use: No  . Sexual Activity: Not Currently   Other Topics Concern  . None   Social History Narrative   Patient requests to use [employer] [facility] for her diabetes testing supplies as of 09/05/2009.   Lives at home with daughter, [name].   Caffeine use: 1 cup coffee/day      Review of Systems: Review of Systems  Constitutional: Negative for fever and chills.  Respiratory: Negative for cough and shortness of breath.   Cardiovascular: Negative for chest pain and leg swelling.  Gastrointestinal: Negative for nausea, vomiting and abdominal pain.  Musculoskeletal: Positive for joint pain. Negative for falls.  Neurological: Negative for dizziness.    Objective:  Physical Exam: Filed Vitals:   10/30/14 1358  BP: 145/94  Pulse: 66  Temp: 97.9 F (36.6 C)  TempSrc: Oral  SpO2: 100%   Physical Exam  Constitutional:  Elderly woman, NAD, sitting in wheelchair.  HENT:  Head: Normocephalic and atraumatic.  Right Ear: Decreased hearing is noted.  Left Ear: Decreased hearing is noted.  Cardiovascular: Normal rate and regular rhythm.   Pulmonary/Chest: Effort normal and breath sounds normal.  Musculoskeletal: She exhibits no edema or tenderness.       Right knee: She exhibits no swelling and no effusion. No tenderness found.       Left knee: She exhibits no swelling and no effusion. No tenderness found.  Skin: Skin is warm.    Assessment & Plan:  Please see problem based charting.

## 2014-10-30 NOTE — Patient Instructions (Signed)
It was a pleasure to meet you [name]  I will refer you to a hearing specialist (audiologist) for your decreased hearing.  I have sent a prescription of Ibuprofen to your pharmacy. This is a 400 mg dose you can take every 6 hours only as needed for your right knee pain. Please ask the pharmacy for smaller pills if necessary. You can continue to use Tylenol #3 and voltaren gel as well. Also, try heat or ice packs, whichever you prefer.  I have filled out a handicap placard for you as well.

## 2014-10-31 ENCOUNTER — Other Ambulatory Visit: Payer: Self-pay | Admitting: *Deleted

## 2014-10-31 DIAGNOSIS — Z9181 History of falling: Secondary | ICD-10-CM | POA: Diagnosis not present

## 2014-10-31 DIAGNOSIS — E11329 Type 2 diabetes mellitus with mild nonproliferative diabetic retinopathy without macular edema: Secondary | ICD-10-CM | POA: Diagnosis not present

## 2014-10-31 DIAGNOSIS — G40909 Epilepsy, unspecified, not intractable, without status epilepticus: Secondary | ICD-10-CM | POA: Diagnosis not present

## 2014-10-31 DIAGNOSIS — F0390 Unspecified dementia without behavioral disturbance: Secondary | ICD-10-CM | POA: Diagnosis not present

## 2014-10-31 DIAGNOSIS — I10 Essential (primary) hypertension: Secondary | ICD-10-CM | POA: Diagnosis not present

## 2014-10-31 DIAGNOSIS — I509 Heart failure, unspecified: Secondary | ICD-10-CM | POA: Diagnosis not present

## 2014-10-31 NOTE — Patient Outreach (Signed)
Call placed to daughter for weekly transition of care call.  She states that the member has been doing "ok" and that she had her follow up appointment with the PCP yesterday.  She reports that all of the member's medications are now correct.  Daughter states that the [facility] PACE appointment last week went well, but there has not been a final decision as to whether the member will accept being involved with that program.  The daughter states that she is leaving the decision up to the member.  Daughter states that someone has told her about a CAP program where she can be paid to care for her mother.  She is unable to provide details, but states that she will look into it.    She also states that she has been having problems with increase bills (water and electric) and is considering relocation.  She states that she does not always have the finances to pay the bills but does the best that she can for the sake of the member.  Made aware that this care manager will contact the social worker to inquire about other community resources.  Will continue with transition of care calls next week.  Valente David, BSN, [place] Management  [facility] Care Manager [phone]

## 2014-11-01 DIAGNOSIS — I509 Heart failure, unspecified: Secondary | ICD-10-CM | POA: Diagnosis not present

## 2014-11-01 DIAGNOSIS — F0390 Unspecified dementia without behavioral disturbance: Secondary | ICD-10-CM | POA: Diagnosis not present

## 2014-11-01 DIAGNOSIS — G40909 Epilepsy, unspecified, not intractable, without status epilepticus: Secondary | ICD-10-CM | POA: Diagnosis not present

## 2014-11-01 DIAGNOSIS — I10 Essential (primary) hypertension: Secondary | ICD-10-CM | POA: Diagnosis not present

## 2014-11-01 DIAGNOSIS — E11329 Type 2 diabetes mellitus with mild nonproliferative diabetic retinopathy without macular edema: Secondary | ICD-10-CM | POA: Diagnosis not present

## 2014-11-01 DIAGNOSIS — Z9181 History of falling: Secondary | ICD-10-CM | POA: Diagnosis not present

## 2014-11-01 NOTE — Telephone Encounter (Signed)
Pt had OV on 9/13.  Med refilled by PCP

## 2014-11-05 NOTE — Progress Notes (Signed)
I saw and evaluated the patient.  I personally confirmed the key portions of the history and exam documented by Dr. Sherrye Payor and I reviewed pertinent patient test results.  The assessment, diagnosis, and plan were formulated together and I agree with the documentation in the resident's note.

## 2014-11-06 DIAGNOSIS — I509 Heart failure, unspecified: Secondary | ICD-10-CM | POA: Diagnosis not present

## 2014-11-06 DIAGNOSIS — Z9181 History of falling: Secondary | ICD-10-CM | POA: Diagnosis not present

## 2014-11-06 DIAGNOSIS — F0390 Unspecified dementia without behavioral disturbance: Secondary | ICD-10-CM | POA: Diagnosis not present

## 2014-11-06 DIAGNOSIS — G40909 Epilepsy, unspecified, not intractable, without status epilepticus: Secondary | ICD-10-CM | POA: Diagnosis not present

## 2014-11-06 DIAGNOSIS — I10 Essential (primary) hypertension: Secondary | ICD-10-CM | POA: Diagnosis not present

## 2014-11-06 DIAGNOSIS — E11329 Type 2 diabetes mellitus with mild nonproliferative diabetic retinopathy without macular edema: Secondary | ICD-10-CM | POA: Diagnosis not present

## 2014-11-08 ENCOUNTER — Ambulatory Visit: Payer: Medicare Other | Admitting: Neurology

## 2014-11-09 ENCOUNTER — Other Ambulatory Visit: Payer: Self-pay | Admitting: *Deleted

## 2014-11-09 ENCOUNTER — Encounter: Payer: Self-pay | Admitting: Neurology

## 2014-11-09 NOTE — Patient Outreach (Signed)
Weekly transition of care call placed to member's daughter.  She states that the member is doing better and has not had any complications or problems with care.  She denies any concerns at that time.    Daughter states that she is in the process of moving and is no longer at the current residence listed.  She states that they are currently living at her daughters house until the final move.  This care manager inquires about the decision regarding the PACE program, daughter states that they have decided not to pursue the program.  Will notify [facility] social worker, B. Blanch Media, of decision.    Will continue with transition of care calls next week.  Will also contact care manager assistant to provide new address once the move is final.  Valente David, BSN, [place] Manager [phone]

## 2014-11-16 ENCOUNTER — Other Ambulatory Visit: Payer: Self-pay | Admitting: *Deleted

## 2014-11-16 NOTE — Patient Outreach (Signed)
Weekly transition of care call placed to daughter.  She states that the member is doing good, and that she has no immediate concerns at this time.  She reports that they have moved into their new home, but there are still concerns regarding getting the member in and out of the home safely.  She states her uncle will repair the porch, but that she is still in need of a ramp to safely get the member out.  Daughter reports that she has not heard anything back from PACE, stating that the member does not want to be involved in the program, but she has not had the opportunity to discuss with the PACE representative.  Daughter also states that someone from [place] was to bring the member a shower chair, but still have not.  She state that she is unsure if someone tried to visit at the old home.  This care manager will contact PACE to inquire about evaluation status, contact [place] to inquire about their involvement with the member, and will contact the social worker, B. Blanch Media, regarding community resources.  Routine home visit scheduled for next week.  Valente David, BSN, [place] Management  [facility] Care Manager [phone]

## 2014-11-21 DIAGNOSIS — I509 Heart failure, unspecified: Secondary | ICD-10-CM | POA: Diagnosis not present

## 2014-11-21 DIAGNOSIS — F0390 Unspecified dementia without behavioral disturbance: Secondary | ICD-10-CM | POA: Diagnosis not present

## 2014-11-21 DIAGNOSIS — E11329 Type 2 diabetes mellitus with mild nonproliferative diabetic retinopathy without macular edema: Secondary | ICD-10-CM | POA: Diagnosis not present

## 2014-11-21 DIAGNOSIS — Z9181 History of falling: Secondary | ICD-10-CM | POA: Diagnosis not present

## 2014-11-21 DIAGNOSIS — G40909 Epilepsy, unspecified, not intractable, without status epilepticus: Secondary | ICD-10-CM | POA: Diagnosis not present

## 2014-11-21 DIAGNOSIS — I10 Essential (primary) hypertension: Secondary | ICD-10-CM | POA: Diagnosis not present

## 2014-11-22 ENCOUNTER — Other Ambulatory Visit: Payer: Self-pay | Admitting: *Deleted

## 2014-11-22 VITALS — BP 132/74 | HR 80 | Resp 18

## 2014-11-22 DIAGNOSIS — E1169 Type 2 diabetes mellitus with other specified complication: Secondary | ICD-10-CM

## 2014-11-22 NOTE — Patient Outreach (Signed)
[facility]) Care Management   11/22/2014  NGOC DETJEN July 27, 1933 [id number]  LAVORIS CANIZALES is an 81 y.o. female  Subjective:   Member states that she is "alright."  She complains of pain in her legs, but has not taken any pain medicine today.  Encouraged to take something for pain.  She states that Ibuprofen usually helps to relieve it.  Objective:   Review of Systems  Constitutional: Negative.   HENT: Negative.   Eyes: Negative.   Respiratory: Negative.   Cardiovascular: Negative.   Gastrointestinal: Negative.   Genitourinary: Negative.   Musculoskeletal:       Leg pain   Skin: Negative.   Neurological: Negative.   Endo/Heme/Allergies: Negative.   Psychiatric/Behavioral: Negative.     Physical Exam  Constitutional: She is oriented to person, place, and time. She appears well-developed and well-nourished.  Neck: Normal range of motion.  Cardiovascular: Normal rate, regular rhythm and normal heart sounds.   Respiratory: Effort normal and breath sounds normal.  GI: Soft. Bowel sounds are normal.  Musculoskeletal: Normal range of motion.  Neurological: She is alert and oriented to person, place, and time.  Skin: Skin is warm and dry.   BP 132/74 mmHg  Pulse 80  Resp 18  SpO2 98%   Current Medications:   Current Outpatient Prescriptions  Medication Sig Dispense Refill  . acetaminophen-codeine (TYLENOL #3) 300-30 MG per tablet Take 1 tablet by mouth every 6 (six) hours as needed for moderate pain. 90 tablet 0  . amLODipine-benazepril (LOTREL) 5-40 MG per capsule Take 1 capsule by mouth daily. 30 capsule 2  . aspirin EC 81 MG tablet Take 81 mg by mouth daily.    . carvedilol (COREG) 25 MG tablet Take 1 tablet (25 mg total) by mouth 2 (two) times daily with a meal. 180 tablet 4  . diclofenac sodium (VOLTAREN) 1 % GEL Apply 4 g topically 4 (four) times daily. (Patient taking differently: Apply 4 g topically 4 (four) times daily as needed (pain). )  100 g 1  . feeding supplement, GLUCERNA SHAKE, (GLUCERNA SHAKE) LIQD Take 237 mLs by mouth 2 (two) times daily between meals.    Marland Kitchen FLUoxetine (PROZAC) 10 MG capsule Take 1 capsule (10 mg total) by mouth daily. 30 capsule 2  . glucose blood (ACCU-CHEK AVIVA) test strip Use to Check Blood Sugars 3 Times Daily.Dx Code: 250.00. Insulin dependent 100 each 12  . ibuprofen (ADVIL,MOTRIN) 400 MG tablet Take 1 tablet (400 mg total) by mouth every 6 (six) hours as needed. 120 tablet 2  . insulin aspart (NOVOLOG FLEXPEN) 100 UNIT/ML FlexPen Novolog Insulin: 91 - 160 5 units 161- 230 7 units 231-300 9 units 301- 370 11 units 371 - 440 13 units 441 - 510 15 units 15 mL 11  . Insulin Glargine (LANTUS SOLOSTAR) 100 UNIT/ML Solostar Pen Inject 17 Units into the skin at bedtime. (Patient taking differently: Inject 5 Units into the skin daily at 10 pm. ) 10 mL 2  . iron polysaccharides (FERREX 150) 150 MG capsule Take 1 capsule (150 mg total) by mouth daily. 30 capsule 3  . levETIRAcetam (KEPPRA) 750 MG tablet Take 1 tablet (750 mg total) by mouth 2 (two) times daily. 60 tablet 11  . omeprazole (PRILOSEC) 20 MG capsule Take 1 capsule (20 mg total) by mouth daily. 30 capsule 3  . simvastatin (ZOCOR) 20 MG tablet TAKE 1 TABLET BY MOUTH EVERY DAY 90 tablet 3  . chlorhexidine (PERIDEX) 0.12 %  solution Use as directed 15 mLs in the mouth or throat 2 (two) times daily.     No current facility-administered medications for this visit.    Functional Status:   In your present state of health, do you have any difficulty performing the following activities: 10/30/2014 10/13/2014  Hearing? Y N  Vision? N N  Difficulty concentrating or making decisions? [name]  Walking or climbing stairs? Y Y  Dressing or bathing? Y Y  Doing errands, shopping? Y Y  Preparing Food and eating ? - -  Using the Toilet? - -  In the past six months, have you accidently leaked urine? - -  Do you have problems with loss of bowel  control? - -  Managing your Medications? - -  Managing your Finances? - -  Housekeeping or managing your Housekeeping? - -    Fall/Depression Screening:    PHQ 2/9 Scores 10/30/2014 09/03/2014 07/20/2014 07/03/2014 06/07/2014 03/23/2014 03/06/2014  PHQ - 2 Score 0 0 0 0 0 0 0    Assessment:    Member's daughter previously questioned about the progress of the PACE assessment and evaluation.  Update provided on PACE progress according to the intake specialist at the center.  The member is still being considered for the program and a representative will be calling the daughter to provide more details.  Daughter advised on the benefits of the program, including socialization.  Daughter states that she will continue with the evaluation but will let the member make the final decision.  Member's A1C continue to be elevated.  According to daughter, member does not completely adhere to a diabetic diet and she "eat what we eat."  Discussed diabetic diet in effort to control diabetes and lower A1C, daughter verbalizes understanding.  Unable to review blood glucose readings due to the monitor not being available.  Daughter states that it is at her daughter, the member's granddaughter's, house.  Advised to obtain the meter as soon as possible and begin regular glucose checks and documentation.  Member and daughter denies any concerns at this time.  Encourage to contact this care manager with any concerns.  Plan:   Will follow up with member's daughter next month regarding decision for PACE program. Will send information and education regarding appropriate diabetes care, including diet and ways to control A1C.  THN CM Care Plan Problem One        Most Recent Value   Care Plan Problem One  Elevated A1C (10.3)   Role Documenting the Problem One  Care Management Coordinator   Care Plan for Problem One  Active   THN Long Term Goal (31-90 days)  Member's A1C will decrease below desired range within the next 90 days    THN Long Term Goal Start Date  11/22/14 [Goal not met, date reset]   THN CM Short Term Goal #1 (0-30 days)  Member's daughter will monitor blood sugars on a daily basis and record readings within the next 4 weeks   THN CM Short Term Goal #1 Start Date  11/22/14 [Not met, rescheduled]   Interventions for Short Term Goal #1  Discusses the importance of monitoring daily blood sugars and recording to visualize a trend, and report any abnormal readings to physician   THN CM Short Term Goal #2 (0-30 days)  Member will take medications as prescribed within the next 4 weeks   THN CM Short Term Goal #2 Start Date  10/17/14 [Not met, rescheduled]   [facility] CM Short Term  Goal #2 Met Date  11/22/14   Interventions for Short Term Goal #2  Discussed the importance of taking medications as prescribed   THN CM Short Term Goal #3 (0-30 days)  Member and family will monitor diet and be sure to follow diabetic and heart healthy diet over he next 4 weeks   THN CM Short Term Goal #3 Start Date  11/22/14 [Not met, rescheduled]   Interventions for Short Tern Goal #3  Discussed the importance of following proper diet (low carbs, low sodium), EMMI assigned.    THN CM Care Plan Problem Two        Most Recent Value   Care Plan Problem Two  Hypertension   Role Documenting the Problem Two  Care Management Coordinator   Care Plan for Problem Two  Not Active   THN CM Short Term Goal #1 (0-30 days)  Member's daughter will moitor member's blood pressure daily and record readings within the next 4 weeks   THN CM Short Term Goal #1 Start Date  10/17/14 [Not met, rescheduled]   THN CM Short Term Goal #1 Met Date   -- [Goal not met]   Interventions for Short Term Goal #2   Discussed the importance of monitoring blood pressure to provide accurate trends to physician for medication management    [facility] CM Care Plan Problem Three        Most Recent Value   Care Plan Problem Three  Recent hospitalization   Role Documenting the Problem  Three  Care Management Coordinator   Care Plan for Problem Three  Not Active   THN Long Term Goal (31-90) days  Member will not be readmitted to the hospital within the next 31 days   THN Long Term Goal Start Date  10/16/14   [facility] Long Term Goal Met Date  11/13/14   Interventions for Problem Three Long Term Goal  Discussed importance in following all discharge instructions to decrease risk of readmission: diet, medications, follow up visit     Valente David, BSN, [place] Manager [phone]

## 2014-11-28 ENCOUNTER — Other Ambulatory Visit: Payer: Self-pay | Admitting: Internal Medicine

## 2014-12-10 ENCOUNTER — Other Ambulatory Visit: Payer: Self-pay | Admitting: Internal Medicine

## 2014-12-10 DIAGNOSIS — M1990 Unspecified osteoarthritis, unspecified site: Secondary | ICD-10-CM

## 2014-12-10 NOTE — Telephone Encounter (Signed)
Pt requesting tylenol # 3 to be filled.

## 2014-12-11 DIAGNOSIS — E11329 Type 2 diabetes mellitus with mild nonproliferative diabetic retinopathy without macular edema: Secondary | ICD-10-CM | POA: Diagnosis not present

## 2014-12-11 DIAGNOSIS — G40909 Epilepsy, unspecified, not intractable, without status epilepticus: Secondary | ICD-10-CM | POA: Diagnosis not present

## 2014-12-11 DIAGNOSIS — I509 Heart failure, unspecified: Secondary | ICD-10-CM | POA: Diagnosis not present

## 2014-12-11 DIAGNOSIS — Z9181 History of falling: Secondary | ICD-10-CM | POA: Diagnosis not present

## 2014-12-11 DIAGNOSIS — I10 Essential (primary) hypertension: Secondary | ICD-10-CM | POA: Diagnosis not present

## 2014-12-11 DIAGNOSIS — F0390 Unspecified dementia without behavioral disturbance: Secondary | ICD-10-CM | POA: Diagnosis not present

## 2014-12-11 MED ORDER — ACETAMINOPHEN-CODEINE #3 300-30 MG PO TABS: 1.0000 | ORAL_TABLET | Freq: Four times a day (QID) | ORAL | Status: DC | PRN

## 2014-12-11 NOTE — Telephone Encounter (Signed)
rx phoned in

## 2014-12-12 NOTE — Patient Outreach (Signed)
[place] [facility]) Care Management  12/12/2014  CURTISTINE PETTITT 1933-11-14 [id number]   Request from Valente David, RN to assign SW, assigned [employer], [place].  Thanks, [name]. [place], [place] Assistant Phone: [phone] Fax: [phone]

## 2014-12-12 NOTE — Addendum Note (Signed)
Addended byValente David on: 12/12/2014 03:44 PM   Modules accepted: Orders

## 2014-12-17 ENCOUNTER — Other Ambulatory Visit: Payer: Self-pay | Admitting: Licensed Clinical Social Worker

## 2014-12-17 NOTE — Patient Outreach (Signed)
[place] [facility]) Care Management  12/17/2014  [name] Jun 20, 1933 [id number]   Assessment-CSW received referral from [facility] as patient needs a ramp and is in need of financial assistance. CSW completed initial outreach on 12/17/14 but was unable to reach patient or family. CSW left a HIPPA compliant voice message encouraging call back.  Plan-CSW will await until 12/20/14 for call back and then will complete second outreach.   [name], BSW, MSW, [email or web address] Phone: [phone] Fax: [phone]

## 2014-12-19 ENCOUNTER — Other Ambulatory Visit: Payer: Self-pay | Admitting: Licensed Clinical Social Worker

## 2014-12-19 NOTE — Patient Outreach (Signed)
[place] [facility]) Care Management  12/19/2014  [name] 1933-12-27 [id number]   Assessment-CSW completed second outreach and was unsuccessful in reaching patient or family. CSW left a HIPPA compliant voice message encouraging patient to return call.  Plan-CSW will await for return call by patient and will complete third outreach on 12/21/14.  [name], BSW, MSW, [email or web address] Phone: [phone] Fax: [phone]

## 2014-12-20 ENCOUNTER — Other Ambulatory Visit: Payer: Self-pay | Admitting: Licensed Clinical Social Worker

## 2014-12-20 NOTE — Patient Outreach (Signed)
[place] [facility]) Care Management  12/20/2014  [name] 10-02-1933 [id number]   Assessment-CSW completed third outreach attempt on 12/20/14 but was unable to reach patient or family. CSW left a HIPPA compliant voice message.   Plan-CSW will make fourth and final outreach on 12/21/14.  [name], BSW, MSW, [email or web address] Phone: [phone] Fax: [phone]

## 2014-12-21 ENCOUNTER — Other Ambulatory Visit: Payer: Self-pay | Admitting: Licensed Clinical Social Worker

## 2014-12-21 NOTE — Patient Outreach (Signed)
[place] [facility]) Care Management  12/21/2014  GENELDA ROARK September 05, 1933 [id number]   Assessment-CSW completed third outreach to patient on 12/21/14 but was unable to reach patient or family. CSW left a HIPPA compliant voice message.  Plan-CSW will complete fourth and final outreach on 12/24/14.  [name], BSW, MSW, [email or web address] Phone: [phone] Fax: [phone]

## 2014-12-24 ENCOUNTER — Ambulatory Visit: Payer: Medicare Other | Attending: Audiology | Admitting: Audiology

## 2014-12-24 NOTE — Addendum Note (Signed)
Addended by: Hulan Fray on: 12/24/2014 06:08 PM   Modules accepted: Orders

## 2014-12-25 ENCOUNTER — Other Ambulatory Visit: Payer: Self-pay | Admitting: Licensed Clinical Social Worker

## 2014-12-25 NOTE — Patient Outreach (Signed)
[place] [facility]) Care Management  12/25/2014  [name] May 18, 1933 [id number]   Assessment-CSW completed fourth and final outreach to patient's residence and daughter's mobile number on 12/25/14. CSW was unable to reach patient or family successfully. CSW left a HIPPA compliant voice message encouraging patient or family to return call once available. CSW will send outreach barrier letter to patient.  Plan-CSW will await 10 day to hear back from patient or family before discharging patient from caseload.  [name], BSW, MSW, [email or web address] Phone: [phone] Fax: [phone]

## 2014-12-27 ENCOUNTER — Other Ambulatory Visit: Payer: Self-pay | Admitting: *Deleted

## 2014-12-27 NOTE — Patient Outreach (Signed)
Call placed to member's daughter to follow up on member's status and to schedule home visit for this month.  Daughter states that the member has been "doing good" and has had not concerns.  She does not elaborate any further.  She does agree to a home visit, scheduled for 11/30.  Daughter informed that B. Blanch Media, Education officer, museum, has been trying to contact her regarding a ramp.  Daughter states that she has not received any calls and requests the social worker check the number she is calling.  Will notify Education officer, museum.  Daughter denies any urgent concerns at this time.  Encouraged to contact this care manager with any questions.  Valente David, BSN, [place] Management  [facility] Care Manager [phone]

## 2014-12-28 ENCOUNTER — Other Ambulatory Visit: Payer: Self-pay | Admitting: Licensed Clinical Social Worker

## 2014-12-28 NOTE — Patient Outreach (Signed)
[facility]) Care Management  12/28/2014  [name] 06-Dec-1933 [id number]   Assessment-CSW completed outreach to patient but was unable to gain contact with family or patient. CSW completed an additional outreach later in the day and was able to successfully reach patient's daughter. CSW informed patient that she has been unable to contact her or patient after many attempts. Daughter reports that she prefers for CSW to contact her after 11:00 as it takes her awhile to get her mother up and dressed. Daughter also informed CSW that number [phone] is not working. CSW explained that RNCM had informed CSW that patient was in need of a ramp. CSW reviewed options with patient's daughter: [employer] and [employer]. Patient is not eligible for [employer] as they rent home and do not own home. CSW informed patient that [facility] will be able to build patient a ramp but that patient has to call and not CSW. CSW explained that they have volunteers only so this process will take time. CSW explained since family rents home that they will have to go through their landlord as it is he/she property. Patient's daughter shares that she will discuss this with her landlord as she does not feel like there will be any problems with this. CSW informed patient's daughter that sometimes it is easier to see if landlord will fund ramp. Patient's daughter expressed understanding and plans to contact agency soon. CSW provided contact information-[phone].   Plan-CSW will follow up in two weeks to gather updates on [name] referral.  [name], BSW, MSW, [email or web address] Phone: [phone] Fax: [phone]

## 2015-01-07 ENCOUNTER — Other Ambulatory Visit: Payer: Self-pay | Admitting: Pulmonary Disease

## 2015-01-07 ENCOUNTER — Other Ambulatory Visit: Payer: Self-pay | Admitting: Licensed Clinical Social Worker

## 2015-01-07 DIAGNOSIS — K219 Gastro-esophageal reflux disease without esophagitis: Secondary | ICD-10-CM

## 2015-01-07 NOTE — Patient Outreach (Signed)
[facility]) Care Management  01/07/2015  MONEA PESANTEZ 1933-09-02 [id number]   Assessment-CSW completed outreach to patient on 01/07/15 to follow up on [facility] referral. CSW was unable to reach patient or patient's daughter and voice message box was full so CSW was not able to leave a HIPPA compliant voice message.  Plan-CSW will make an additional attempt by 01/14/15.  [name], BSW, MSW, [email or web address] Phone: [phone] Fax: [phone]

## 2015-01-15 ENCOUNTER — Other Ambulatory Visit: Payer: Self-pay | Admitting: Licensed Clinical Social Worker

## 2015-01-15 NOTE — Patient Outreach (Addendum)
[facility]) Care Management  01/15/2015  [name] [birth year] [id number]   Assessment-CSW completed outreach to patient's residence on 01/15/15. CSW called and it went to voice mail and it stated that voice mail box was full. CSW tried again and was able to speak to patient's daughter. CSW questioned if she had contacted [employer]. Daughter reports that she completed call to agency and that she was waiting to hear back from them. Daughter reports that she is unsure what to tell the agency as her landlord passed away one week ago. CSW questioned is she would need to relocate and she reported that she did not know. Daughter shares that the landlord's nephew came to their residence and reported to them they that they will be the one that they pay rent to as of now. CSW encouraged daughter to gather as much information as she can so that CSW can assist patient with either relocating or gaining ramp at residence and daughter was agreeable to do so.   Plan-CSW will contact patient within two weeks to follow up. CSW will update RNCM [employer] through in [employer].  [name], BSW, MSW, [email or web address] Phone: [phone] Fax: [phone]

## 2015-01-16 ENCOUNTER — Other Ambulatory Visit: Payer: Self-pay | Admitting: *Deleted

## 2015-01-16 NOTE — Patient Outreach (Signed)
[facility]) Care Management   01/16/2015  [name] [birth year] [id number]  [name] is an 81 y.o. female  Subjective:   Member reports that she is "good."  She denies any pain or discomfort at this time.  Objective:   Review of Systems  Constitutional: Negative.   HENT: Negative.   Eyes: Negative.   Respiratory: Negative.   Cardiovascular: Negative.   Gastrointestinal: Negative.   Genitourinary: Negative.   Musculoskeletal: Negative.   Skin: Negative.   Neurological: Negative.   Endo/Heme/Allergies: Negative.   Psychiatric/Behavioral: Negative.     Physical Exam  Constitutional: She appears well-developed and well-nourished.  Neck: Normal range of motion.  Cardiovascular: Normal rate, regular rhythm and normal heart sounds.   Respiratory: Effort normal and breath sounds normal.  GI: Soft. Bowel sounds are normal.  Musculoskeletal: Normal range of motion.  Neurological: She is alert.  Disoriented to place (address) and time   Skin: Skin is warm and dry.   BP 122/68 mmHg  Pulse 75  Resp 18  SpO2 96%  Current Medications:   Current Outpatient Prescriptions  Medication Sig Dispense Refill  . acetaminophen-codeine (TYLENOL #3) 300-30 MG tablet Take 1 tablet by mouth every 6 (six) hours as needed for moderate pain. 90 tablet 0  . amLODipine-benazepril (LOTREL) 5-40 MG per capsule Take 1 capsule by mouth daily. 30 capsule 2  . aspirin EC 81 MG tablet Take 81 mg by mouth daily.    . carvedilol (COREG) 25 MG tablet Take 1 tablet (25 mg total) by mouth 2 (two) times daily with a meal. 180 tablet 4  . chlorhexidine (PERIDEX) 0.12 % solution Use as directed 15 mLs in the mouth or throat 2 (two) times daily.    . diclofenac sodium (VOLTAREN) 1 % GEL Apply 4 g topically 4 (four) times daily. (Patient taking differently: Apply 4 g topically 4 (four) times daily as needed (pain). ) 100 g 1  . feeding supplement, GLUCERNA SHAKE, (GLUCERNA SHAKE)  LIQD Take 237 mLs by mouth 2 (two) times daily between meals.    Marland Kitchen FLUoxetine (PROZAC) 10 MG capsule Take 1 capsule (10 mg total) by mouth daily. 30 capsule 2  . glucose blood (ACCU-CHEK AVIVA) test strip Use to Check Blood Sugars 3 Times Daily.Dx Code: 250.00. Insulin dependent 100 each 12  . ibuprofen (ADVIL,MOTRIN) 400 MG tablet Take 1 tablet (400 mg total) by mouth every 6 (six) hours as needed. 120 tablet 2  . insulin aspart (NOVOLOG FLEXPEN) 100 UNIT/ML FlexPen Novolog Insulin: 91 - 160 5 units 161- 230 7 units 231-300 9 units 301- 370 11 units 371 - 440 13 units 441 - 510 15 units 15 mL 11  . Insulin Glargine (LANTUS SOLOSTAR) 100 UNIT/ML Solostar Pen Inject 17 Units into the skin at bedtime. (Patient taking differently: Inject 5 Units into the skin daily at 10 pm. ) 10 mL 2  . iron polysaccharides (FERREX 150) 150 MG capsule Take 1 capsule (150 mg total) by mouth daily. 30 capsule 3  . levETIRAcetam (KEPPRA) 750 MG tablet Take 1 tablet (750 mg total) by mouth 2 (two) times daily. 60 tablet 11  . omeprazole (PRILOSEC) 20 MG capsule TAKE 1 CAPSULE (20 MG TOTAL) BY MOUTH DAILY. 30 capsule 5  . simvastatin (ZOCOR) 20 MG tablet TAKE 1 TABLET BY MOUTH EVERY DAY 90 tablet 3   No current facility-administered medications for this visit.    Functional Status:   In your present state of health,  do you have any difficulty performing the following activities: 10/30/2014 10/13/2014  Hearing? Y N  Vision? N N  Difficulty concentrating or making decisions? [name]  Walking or climbing stairs? Y Y  Dressing or bathing? Y Y  Doing errands, shopping? Y Y  Preparing Food and eating ? - -  Using the Toilet? - -  In the past six months, have you accidently leaked urine? - -  Do you have problems with loss of bowel control? - -  Managing your Medications? - -  Managing your Finances? - -  Housekeeping or managing your Housekeeping? - -    Fall/Depression Screening:    PHQ 2/9 Scores  10/30/2014 09/03/2014 07/20/2014 07/03/2014 06/07/2014 03/23/2014 03/06/2014  PHQ - 2 Score 0 0 0 0 0 0 0    Assessment:    Member's daughter is present during visit, but is minimally involved with assessment and conversation (she was asleep on couch in the next room, needed much prompting to be involved in conversation regarding member's health).  This care manager previously discussed the member's diet with daughter and the importance of maintaining a healthy diet (low salt, low carbohydrate, low sugar).  Diet adherence continues to be a concern as the member does not follow her suggested diet.  This care manager asked daughter about member's blood sugar trends, daughter is unable to discuss, stating that her daughter has the meter and is responsible for coming over to monitor the member's blood sugar daily.  Daughter does state that the member is given all of her medications daily and appropriately.  Daughter made aware of possible consult to health coach to continue work with diabetes education, she agrees to telephonic services.  Daughter states that she still is waiting to hear back from [facility] regarding an appointment for the member to see the physician, at which point a decision will be made about acceptance.  Again provided with the benefits of the program.    Member has appointment with neurologist tomorrow afternoon, daughter made aware.  Daughter states that she has been having a lot going on with the home that she is renting.  She reports that the landlord died a couple weeks ago and that she is unaware of what her living situation will be in the weeks to come.  She also states that there has been a pipe to burst under the house and her water has been shut off until the pipe can be repaired. Unfortunately, she is not sure who to call to have it repaired since the landlord has died.  She also can't have her heat turned on due to something being wrong with the furnace (it has been several years since the  heat has been turned on).  She states that she has contacted legal aide regarding her situation and they are expediting the process for assistance due to her circumstance.  Made aware that this care manger will inform [employer]. [employer], Education officer, museum, about concerns.  Daughter and member both deny any further concerns regarding medical condition.  Encouraged to contact this care manager with any questions.  Plan:   Will notify social worker, B. [employer], of ongoing concerns with the home. Will consult telephonic health coach once community resources are coordinated with Education officer, museum.  THN CM Care Plan Problem One        Most Recent Value   Care Plan Problem One  Elevated A1C (10.3)   Role Documenting the Problem One  Care Management Coordinator   Care  Plan for Problem One  Active   THN Long Term Goal (31-90 days)  Member's A1C will decrease below desired range within the next 90 days   THN Long Term Goal Start Date  11/22/14 [Goal not met, date reset]   THN CM Short Term Goal #1 (0-30 days)  Member's daughter will monitor blood sugars on a daily basis and record readings within the next 4 weeks   THN CM Short Term Goal #1 Start Date  11/22/14 [Not met, rescheduled]   THN CM Short Term Goal #1 Met Date  -- [Goal not met]   Interventions for Short Term Goal #1  Discusses the importance of monitoring daily blood sugars and recording to visualize a trend, and report any abnormal readings to physician   THN CM Short Term Goal #2 (0-30 days)  Member will take medications as prescribed within the next 4 weeks   THN CM Short Term Goal #2 Start Date  10/17/14 [Not met, rescheduled]   [facility] CM Short Term Goal #2 Met Date  11/22/14   Interventions for Short Term Goal #2  Discussed the importance of taking medications as prescribed   THN CM Short Term Goal #3 (0-30 days)  Member and family will monitor diet and be sure to follow diabetic and heart healthy diet over he next 4 weeks   THN CM Short Term Goal #3 Start  Date  11/22/14 [Not met, rescheduled]   THN CM Short Term Goal #3 Met Date  -- [Goal not met]   Interventions for Short Tern Goal #3  Discussed the importance of following proper diet (low carbs, low sodium), EMMI assigned.    THN CM Care Plan Problem Three        Most Recent Value   Care Plan Problem Three  Recent hospitalization   Role Documenting the Problem Three  Care Management Coordinator   Care Plan for Problem Three  Not Active   THN Long Term Goal (31-90) days  Member will not be readmitted to the hospital within the next 31 days   THN Long Term Goal Start Date  10/16/14   [facility] Long Term Goal Met Date  11/13/14   Interventions for Problem Three Long Term Goal  Discussed importance in following all discharge instructions to decrease risk of readmission: diet, medications, follow up visit     Valente David, BSN, [place] [phone]

## 2015-01-17 ENCOUNTER — Ambulatory Visit: Payer: Medicare Other | Admitting: Neurology

## 2015-01-17 ENCOUNTER — Telehealth: Payer: Self-pay | Admitting: Neurology

## 2015-01-17 NOTE — Telephone Encounter (Signed)
Noted/fim 

## 2015-01-17 NOTE — Telephone Encounter (Signed)
Pt's daughter called said RN just left pt's house and as she was leaving told her of the appt today. She was not aware of it. She sts the landlord  passed the 1st of November and did not do repairs to the house.They have no running water, a pipe has burst and the water has been turned off, can not use furnace as it has been 33yr since heat has been turned on the house, they are using little space heaters. She has not been able to bathe her mother and did not want to bring her without a bath. She has been told they could have to move within seven days if family members of the landlord decide to evict them. At this point she doesn't know what is going to happen. She has notified sEducation officer, museum [employer] all pt's doctors.

## 2015-01-22 ENCOUNTER — Other Ambulatory Visit: Payer: Self-pay | Admitting: Licensed Clinical Social Worker

## 2015-01-22 NOTE — Patient Outreach (Signed)
[place] [facility]) Care Management  01/22/2015  MALEYAH EVANS 05/29/1933 [id number]   Assessment-CSW received update from [facility] stating that patient's residence has no running water and no heat but since landlord passed away she is unsure as what to do. Patient's daughter is now in contact with Legal Aide and they have expedited her case. CSW completed outreach to patient's number but was unable patient or family. CSW was also unable to leave a voice message as mail box is full.  Plan-CSW will complete an additional outreach by 01/29/15.  [name], BSW, MSW, [email or web address] Phone: [phone] Fax: [phone]

## 2015-01-29 ENCOUNTER — Other Ambulatory Visit: Payer: Self-pay | Admitting: Licensed Clinical Social Worker

## 2015-01-29 NOTE — Patient Outreach (Signed)
[place] [facility]) Care Management  01/29/2015  [name] 04-22-1933 [id number]   Assessment-CSW completed outreach to patient's residence on 01/29/15 but was unable to reach patient or patient's daughter. CSW left a HIPPA compliant voice message encouraging patient or family to return call.  Plan-CSW will await call back from patient or family. If CSW has not heard back by next week, will complete an additional outreach.  [name], BSW, MSW, [email or web address] Phone: [phone] Fax: [phone]

## 2015-01-30 ENCOUNTER — Other Ambulatory Visit: Payer: Self-pay | Admitting: *Deleted

## 2015-01-30 NOTE — Patient Outreach (Signed)
Call placed to member's daughter to follow up on status with rental home.  No answer, HIPPA compliant voice message left.  Will await call back.  Will update [employer], Education officer, museum, on outreach so that an attempt may be made next week.    Valente David, BSN, [place] Management  [facility] Care Manager [phone]

## 2015-01-31 ENCOUNTER — Other Ambulatory Visit: Payer: Self-pay | Admitting: Licensed Clinical Social Worker

## 2015-01-31 NOTE — Patient Outreach (Addendum)
[facility]) Care Management  01/31/2015  [name] April 01, 1933 [id number]   Assessment-CSW received call from patient's daughter. Daughter informed CSW that house will be condemned due to no working heat and family will need to move out of house by 02/05/15. CSW provided emergency resources for [facility] for temporary housing again to daughter. Patient's daughter is currently at [facility] who has been in contact with [place] in regards to patient's case. [employer] has also been in contact with patient's current landlord. CSW spoke to the [employer] and questioned if their program could provide any funds or resources to help assist landlord to repair heat. Representative reports that they could if the repair cost was up to $500 but that the repair cost for the heat according to the inspector is $9,000. CSW questioned representative if they could assist patient in finding a new residence within their program and he declined. Representative reports no current fund for housing and that their only suggestion would be for a shelter temporarily. However, patient has denied that they can go to a shelter due to patient's current health. Patient's daughter states that she needs to get off the phone at this time and will contact CSW back tomorrow.  Plan-CSW will contact [place] to provide updates. CSW will continue to search for resources for current patient crisis.  [name], BSW, MSW, [email or web address] Phone: [phone] Fax: [phone]

## 2015-01-31 NOTE — Patient Outreach (Signed)
[facility] [facility]) Care Management  01/31/2015  [name] [birth year] [id number]   Assessment-CSW received call from patient's daughter on 01/31/15. Patient's daughter reports that she is dealing with a crisis at this time. Daughter shares that she continues to work with Scientist, research (life sciences) and that they contacted the [place]. Daughter reports that the [place] came to her residence to complete inspection as home has no heat or working water. Daughter reports that the [employer] made attempts to fix the heat but that they were unsuccessful. Patient's daughter reports that "we may have to be out in 48 hours. I am waiting to hear back from the city. He said if we had to leave that they would help [place] find a new place with [employer]." CSW questioned where family would go if home were to be condemned. Patient's daughter shares that they cannot stay with her children as they have different home but all have steps and patient can not use steps. CSW provided housing resources for family during this crisis such as [employer], nearby shelters for family and  [facility] crisis programs. Patient's daughter shares that she wishes to wait to hear back from inspector and Legal Aide today before making a decision. Patient's daughter states that she does not wish to go to a shelter. Patient's daughter shares that Legal Aide informed her to pay this month's rent to nephew and if they have to move out then they would take nephew to court in order to gain rent money back. Patient's daughter gives permission for CSW to provide updates to [facility]. CSW will contact [place] team.   Plan-CSW has contacted [place] and provided updates. RNCM and CSW will contact clinical supervisor at this time. CSW will continue to assist patient with crisis.  [name], BSW, MSW, [email or web address] Phone: [phone] Fax: [phone]

## 2015-02-01 ENCOUNTER — Other Ambulatory Visit: Payer: Self-pay | Admitting: Licensed Clinical Social Worker

## 2015-02-01 NOTE — Patient Outreach (Addendum)
[facility]) Care Management  02/01/2015  [name] Nov 03, 1933 [id number]   Assessment-CSW received return call from patient's daughter on 02/01/15. Daughter reports that [employer] is closed today and therefore Legal Aide has been in contact with patient's landlord. Daughter reports that the landlord had someone come to her residence to fix air and heat but abruptly left without notice. Daughter reports that the landlord brought another heater to their residence but reports that it stopped working as well. Daughter shares that Legal Aide has decided to discontinue all contact with Landlord at this time. Daughter shares that Legal Aide will be bringing her blankets this afternoon since their heat has not been fixed and become of the cold weather this weekend. CSW encouraged daughter to contact entire family so that they did not have to stay at residence tonight. Daughter reports "we have too many people here and no one wants to deal with that." CSW suggested splitting party up in two groups and going to stay at one of her children's home or one of her siblings. Daughter declined. Daughter does not wish to go to a shelter. CSW suggested that daughter take patient and family to a hotel for the weekend as it would not be safe to stay at residence without working heat. Daughter expressed understanding. CSW informed patient's daughter that family may need to stay at an extended hotel until they were able to regroup and find another home to rent. CSW informed daughter that patient may need to be placed in a nursing facility and daughter became emotional stating that neither patient or herself wanted that. CSW encouraged daughter to see the benefits of placement. Daughter reports that patient has refused placement in the past and dismisses her when she brings up topic. Daughter shares "We will figure it out. We will just have to go to a hotel for the weekend until we know  what to do." Daughter shares that Legal Aide has decided to contact [employer] with daughter's permission. Daughter shares that they will be coming to her home tomorrow to interview her for a News segment in order to bring about awareness and to help their family with resources. Daughter reports that she is agreeable to this. Daughter agrees to take patient somewhere safe over the weekend whether it be a friend or family's home or a hotel. CSW provided daughter with crisis resources for [facility] again. CSW educated daughter on a free holiday meal sponsored by [employer] for anyone in need of a holiday meal on 02/10/15 located at [place], [place] [address]. Daughter is aware that food can be picked up or delivered to residence. CSW will make outreach on 02/04/15. CSW contacted [facility] to provide updates.  Plan-CSW will continue to assist patient with all needs during this crisis.  [name], BSW, MSW, [email or web address] Phone: [phone] Fax: [phone]

## 2015-02-01 NOTE — Patient Outreach (Signed)
[place] [facility]) Care Management  02/01/2015  [name] September 17, 1933 [id number]   Assessment-CSW completed outreach to patient and patient's family on 02/01/15 to follow up on housing crisis. CSW was unable to reach patient or daughter and left a HIPPA compliant voice message urging patient's daughter to return call once available.  Plan-CSW will continue to make outreaches to patient and be available for all social work needs.  [name], BSW, MSW, [email or web address] Phone: [phone] Fax: [phone]

## 2015-02-04 ENCOUNTER — Other Ambulatory Visit: Payer: Self-pay | Admitting: Licensed Clinical Social Worker

## 2015-02-04 NOTE — Patient Outreach (Signed)
[place] [facility]) Care Management  02/04/2015  [name] 1934-01-03 [id number]   Assessment-CSW completed outreach to patient on 02/04/15. Daughter answered and stated that patient is safe and that family stayed at her daughter's home for the weekend. Daughter is very frustrated at this time point as she had not been able to gain temporary housing with [employer]. Patient's daughter shares "I am frustrated with talking on the phone all day to people and not getting anywhere." Daughter reports that Legal Aide has started to process of suing landlord. Daughter continues to state that she will not take her mother to a shelter and that her mother does not wish to go to a nursing facility. Patient's daughter reports that family cannot move in with her own daughter as "it will not be enough room." Daughter continues to search for stable housing as they will need to leave residence by 02/05/15. Daughter hung up during call at this time. CSW placed several outreaches throughout the day but was unable to reach daughter again. CSW left a HIPPA compliant voice message urging patient's daughter to return call.  Plan-CSW will make outreach on 02/05/15. CSW will continue to be available to patient and family for all social work needs.  [name], BSW, MSW, [email or web address] Phone: [phone] Fax: [phone]

## 2015-02-05 ENCOUNTER — Other Ambulatory Visit: Payer: Self-pay | Admitting: *Deleted

## 2015-02-05 NOTE — Patient Outreach (Signed)
[place] [facility]) Care Management  02/05/2015  WOODIE TRUSTY 20-Oct-1933 [id number]   Covering for Valente David, RN  RN spoke with pt's daughter today [name] (primary caregiver) and introduced Therapist, sports and purpose of today's call. Daughter indicates pt is doing well however several issues going on with her move out date. States the notice was put on her current residence on yesterday to move out. The local shelters will not accommodate the pt's hospital bed. Therefore she and the pt are living with her daughter. States she has no other place to go and she has been in contact with several resources that are attempting to assist. States she has a Chief Executive Officer and recently interviewed with [employer] last night. Currently awaiting call backs from the involved services to assist her further. RN inquired on the pt's health and safety. States the pt is "doing well" with no reported problems.  Reports she has a space heater in the home and was provided a large space heater but this causes the power to go out.  RN inquired on any other resources need at this time and confirm her family has enough to eat today (daughter confirms enough supplies). Daughter very emotional as she explained the above situation as Therapist, sports offered moral support and comforted the daughter as needed. Daughter feels a lot of people are calling but nothing has taken placed. RN strongly encouraged daughter to follow up with the involved agencies especially during the upcoming weekend and Holidays in case there are other provisions that can be provided by these local agencies. Daughter very grateful for the call today as RN offered to follow up once again on tomorrow concerning any potential progress. Daughter appreciative and receptive to the planned follow up.   RN contacted the involved social worker Jerene Pitch) concerning the above status on this pt and family and reported this RN will follow up tomorrow as discussed with the pt's daughter  [name]). No need for social worker to call today or tomorrow based upon this RN follow up calls.   Raina Mina, RN Care Management Coordinator [place] [facility] Main Office [phone]

## 2015-02-06 ENCOUNTER — Other Ambulatory Visit: Payer: Self-pay | Admitting: *Deleted

## 2015-02-06 NOTE — Patient Outreach (Addendum)
[facility]) Care Management  02/06/2015  [name] 08/23/1933 [id number] Covering for Roderick Pee RN followed up with this pt caregiver as promised  [name]) however unsuccessful. RN left a message requesting a call back to inquire further on this pt's needs. Will report off to the assigned nurse for further follow ups on this pt and caregiver.   Raina Mina, RN Care Management Coordinator [place] [facility] Main Office [phone]

## 2015-02-07 ENCOUNTER — Other Ambulatory Visit: Payer: Self-pay | Admitting: Licensed Clinical Social Worker

## 2015-02-07 NOTE — Patient Outreach (Signed)
[place] [facility]) Care Management  02/07/2015  [name] Jul 31, 1933 [id number]   Assessment-CSW received call from patient's daughter on 02/07/15. Daughter stated "I wanted to apologize for hanging up on you and being rude." CSW informed patient that she understands due to the amount of stress she is under. CSW questioned daughter on updates. Daughters shares that she has received positive news. Patient went to [employer]. [employer] is willing to either split cost with landlord in order to repair home repairs or provide family with $1,200 in order to find somewhere to stay. Daughter has decided to find somewhere else to stay. Daughter shares that [employer] has given her a list of select houses to choose from but that they informed daughter she can find somewhere that is not on the list as well if needed. Daughter was encouraged to find a residence that is safe, stable and not in need of repairs. Daughter was also encouraged to choose a residence that family can afford to rent. Daughter has until 03/08/14 to choose a residence. Daughter plans to start search today with her sister after patient's aide arrives. Daughter shares that she wishes to find somewhere to relocate by 02/17/15. Patient, daughter and great grand daughter all are at Ms. Cumming's daughter's residence. Ms. Maisie Fus shares that family split up but are all safe and stable. Daughter agrees for telephone call tomorrow at 11:00 am.  Plan-CSW will update [facility] team. CSW will continue to be available to patient for all social work needs.  [name], BSW, MSW, [email or web address] Phone: [phone] Fax: [phone]

## 2015-02-08 ENCOUNTER — Other Ambulatory Visit: Payer: Self-pay | Admitting: Licensed Clinical Social Worker

## 2015-02-08 ENCOUNTER — Other Ambulatory Visit: Payer: Self-pay | Admitting: Internal Medicine

## 2015-02-08 ENCOUNTER — Ambulatory Visit: Payer: Self-pay | Admitting: Licensed Clinical Social Worker

## 2015-02-08 DIAGNOSIS — G40909 Epilepsy, unspecified, not intractable, without status epilepticus: Secondary | ICD-10-CM

## 2015-02-08 DIAGNOSIS — F32A Depression, unspecified: Secondary | ICD-10-CM

## 2015-02-08 DIAGNOSIS — I10 Essential (primary) hypertension: Secondary | ICD-10-CM

## 2015-02-08 DIAGNOSIS — F329 Major depressive disorder, single episode, unspecified: Secondary | ICD-10-CM

## 2015-02-08 NOTE — Patient Outreach (Signed)
[facility] [place] [facility]) Care Management  02/08/2015  BYANCA KASPER 05-Mar-1933 [id number]   Assessment-CSW completed outreach to daughter on 02/08/15 to gather updates on housing. Daughter shares that everything has been completing with [employer] and that "they are just waiting for me to find a place to live." Daughter reports that she went to a few of the houses on the list that was provided to her by [employer]. Daughter shares that a lot of the houses are in the same area as the one she was recently evicted from. Daughter and family do not wish to live within the area as they were previously. Daughter plans to continue her search in finding a stable place to live. Daughter reports that she is hopeful to relocate to a new residence within the next two weeks. Patient is aware that CSW will be out of the office next week. CSW provided daughter with CSW Nat Christen contact information in case a crisis were to occur.   Plan-CSW will update RNCM [employer] and [place].  [name], BSW, MSW, [email or web address] Phone: [phone] Fax: [phone]

## 2015-02-14 ENCOUNTER — Other Ambulatory Visit: Payer: Self-pay | Admitting: *Deleted

## 2015-02-14 NOTE — Patient Outreach (Signed)
Call placed to daughter to obtain update on living situation.  Daughter states that she still has not found housing as of yet. She states the she has called everyone on the list that she was provided, and there are no rentals available that she can afford at this time.  She states she will continue to research and call the resources in hopes of finding somewhere to live long term.  She reports that the member is "doing fine" and denies any health concerns at this time.  Daughter made aware that this care manager will follow up with social worker, B. Blanch Media, next week to provide update.  Encouraged to contact this care manager with any questions.  Valente David, BSN, [place] Management  [facility] Care Manager [phone]

## 2015-02-15 ENCOUNTER — Other Ambulatory Visit: Payer: Self-pay | Admitting: Internal Medicine

## 2015-02-15 DIAGNOSIS — M25561 Pain in right knee: Secondary | ICD-10-CM

## 2015-02-20 NOTE — Telephone Encounter (Signed)
Rx called in to pharmacy. 

## 2015-02-21 ENCOUNTER — Other Ambulatory Visit: Payer: Self-pay | Admitting: Licensed Clinical Social Worker

## 2015-02-21 NOTE — Patient Outreach (Signed)
[place] [facility]) Care Management  02/21/2015  [name] 31-Dec-1933 [id number]   Assessment-CSW completed outreach to patient's daughter on 02/21/15. Daughter answered and reports that she continues to look for stable housing. Daughter reports that she has until 03/09/15 to find a house as [employer] has offered to pay $1,200 of her rent for the first few months. Daughter reports that during Christmas week several people were difficult to get in touch with. Daughter reports that she waited for 3 hours to meet a landlord yesterday but that landlord did not show up. Daughter shares that she has been declined by two landlords as they did not wish to be involved with [employer]. Daughter reports that they are having a difficult time locating a residence to stay long term that is affordable. Daughter has been paying friends and family to transport her to various locations such as [employer], Scientist, research (life sciences) and different houses. Daughter is very frustrated at this time but reports that she feels she will be able to find stable housing by 03/09/15. Daughter has an office visit at [place] at 11:30 am today as daughter is unable to get furniture and belongings out of past residence as that home is condemned.  Family continues to stay at patient's grand daughter's residence. Daughter reports that patient is "doing okay" and has no health concerns. Daughter reports that patient is aware of their situation.   Plan-CSW will complete outreach on 02/22/15 to gain further updates on finding stable housing for patient. CSW will continue to assess patient's safety and well-being.   [name], BSW, MSW, [email or web address] Phone: [phone] Fax: [phone]

## 2015-02-22 ENCOUNTER — Other Ambulatory Visit: Payer: Self-pay | Admitting: *Deleted

## 2015-02-22 ENCOUNTER — Other Ambulatory Visit: Payer: Self-pay | Admitting: Licensed Clinical Social Worker

## 2015-02-22 NOTE — Patient Outreach (Signed)
[facility]) Care Management  02/22/2015  DAZARIA MACNEILL Jan 25, 1934 [id number]   Assessment- CSW received call from [name] stating that patient's daughter contacted her stating they were in need of incontinence supplies and food. CSW completed outreach to patient's daughter and received HIPPA verifications. Daughter expresses frustration for "not getting anywhere with any of these agencies I'm working with." Daughter continues to search for stable housing. Daughter expresses concern for the upcoming severe weather conditions in the [facility] area and states they do not have the funds to gain food. Daughter's ID and social security card are in the previous house that was condemned and she is unable to get documents in order to go to food pantries. Daughter also has an Warehouse manager she is unable to pay for and cannot use [place] program because she does not have ID and Social security card. CSW urged daughter to gain new ID and social security card as these are important documents in order to gain appropriate resources. CSW informs patient's daughter that she can come to [place] office and pick up $25.00 gift card for food and a box of incontinence supplies. Patient's daughter is agreeable to do so.  Plan-CSW has contacted [place] in order to approve a $25.00 gift card for food solely. FAF has been completed previously. Patient's daughter has been provided [place] location and contact information. CSW will make outreaches next week to continue working with patient on gaining stable housing.  [name], BSW, MSW, [email or web address] Phone: [phone] Fax: [phone]

## 2015-02-22 NOTE — Patient Outreach (Signed)
Call received from [facility] daughter requesting a food voucher and a resource that will provide depends free of charge.  Daughter notified that the social worker will be able to provide answers to this questions.  Social worker called, notified of request and will contact the daughter.  Valente David, BSN, [place] Management  [facility] Care Manager [phone]

## 2015-02-26 ENCOUNTER — Other Ambulatory Visit: Payer: Self-pay | Admitting: Licensed Clinical Social Worker

## 2015-02-26 NOTE — Patient Outreach (Signed)
[place] [facility]) Care Management  02/26/2015  [name] 04-Aug-1933 [id number]   Assessment-CSW completed outreach to patient's daughter on 02/26/15. Daughter answered and provided HIPPA verifications. Patient's daughter reports that she was able to retrieve $25.00 gift card paid through [employer] in order to gain food as daughter is unable to go to food pantries (no legal documents.) Patient's daughter reports that their refrigerator is "packed now." Daughter was able to retrieve incontience supplies as well paid through [facility]. Daughter provided updates on gaining stable housing. Daughter has find a house to rent, address [place]. Daughter has been in contact with landlord since 02/24/15 and he is agreeable to allowing family rent residence. Daughter has been in touch with [employer] and they have mailed consents and certain documents to landlord for him to sign. Daughter is currently waiting to hear back from landlord. CSW encouraged daughter to have a back up plan in case landlord is not able to rent residence to family.  Plan-Patient's daughter is agreeable to outreach on 02/27/15. CSW will continue to provide emotional support and community resources as needed.  [name], BSW, MSW, [email or web address] Phone: [phone] Fax: [phone]

## 2015-02-27 ENCOUNTER — Other Ambulatory Visit: Payer: Self-pay | Admitting: Licensed Clinical Social Worker

## 2015-02-27 NOTE — Patient Outreach (Signed)
[place] [facility]) Care Management  02/27/2015  AAYANA REINERTSEN [birth year] [id number]   Assessment-CSW completed outreach to patient's daughter on 02/27/15 but was unable to reach her. CSW was unable to leave a HIPPA compliant voice message as mail box is full.  Plan-CSW will complete second outreach on 02/28/15.  [name], BSW, MSW, [email or web address] Phone: [phone] Fax: [phone]

## 2015-02-28 ENCOUNTER — Other Ambulatory Visit: Payer: Self-pay | Admitting: Internal Medicine

## 2015-02-28 DIAGNOSIS — Z794 Long term (current) use of insulin: Principal | ICD-10-CM

## 2015-02-28 DIAGNOSIS — E113213 Type 2 diabetes mellitus with mild nonproliferative diabetic retinopathy with macular edema, bilateral: Secondary | ICD-10-CM

## 2015-03-01 ENCOUNTER — Ambulatory Visit (INDEPENDENT_AMBULATORY_CARE_PROVIDER_SITE_OTHER): Payer: Medicare Other | Admitting: Pulmonary Disease

## 2015-03-01 ENCOUNTER — Encounter: Payer: Self-pay | Admitting: Pulmonary Disease

## 2015-03-01 ENCOUNTER — Other Ambulatory Visit: Payer: Self-pay | Admitting: Licensed Clinical Social Worker

## 2015-03-01 VITALS — BP 126/54 | HR 71 | Temp 98.1°F | Ht 64.0 in | Wt 205.6 lb

## 2015-03-01 DIAGNOSIS — G8929 Other chronic pain: Secondary | ICD-10-CM

## 2015-03-01 DIAGNOSIS — R195 Other fecal abnormalities: Secondary | ICD-10-CM

## 2015-03-01 DIAGNOSIS — M25561 Pain in right knee: Secondary | ICD-10-CM

## 2015-03-01 DIAGNOSIS — Z7982 Long term (current) use of aspirin: Secondary | ICD-10-CM

## 2015-03-01 DIAGNOSIS — E1165 Type 2 diabetes mellitus with hyperglycemia: Secondary | ICD-10-CM

## 2015-03-01 DIAGNOSIS — Z794 Long term (current) use of insulin: Secondary | ICD-10-CM | POA: Diagnosis not present

## 2015-03-01 DIAGNOSIS — E113299 Type 2 diabetes mellitus with mild nonproliferative diabetic retinopathy without macular edema, unspecified eye: Secondary | ICD-10-CM

## 2015-03-01 LAB — POC HEMOCCULT BLD/STL (OFFICE/1-CARD/DIAGNOSTIC): Fecal Occult Blood, POC: NEGATIVE

## 2015-03-01 LAB — POCT GLYCOSYLATED HEMOGLOBIN (HGB A1C): Hemoglobin A1C: 9.4

## 2015-03-01 LAB — GLUCOSE, CAPILLARY: Glucose-Capillary: 174 mg/dL — ABNORMAL HIGH (ref 65–99)

## 2015-03-01 MED ORDER — INSULIN GLARGINE 100 UNIT/ML SOLOSTAR PEN: 20.0000 [IU] | PEN_INJECTOR | Freq: Every day | SUBCUTANEOUS | Status: DC

## 2015-03-01 MED ORDER — ACETAMINOPHEN-CODEINE #3 300-30 MG PO TABS: 1.0000 | ORAL_TABLET | Freq: Three times a day (TID) | ORAL | Status: DC | PRN

## 2015-03-01 NOTE — Patient Outreach (Signed)
[place] [facility]) Care Management  03/01/2015  GLENNYS SCHORSCH Jun 22, 1933 [id number]   Assessment-CSW completed outreach to patient's daughter's mobile number and patient's granddaughter answered phone who stated "She took my grandmother to the doctor. They just left a few minutes ago." CSW informed family member that she will try outreach at a different time.  Plan-CSW will complete outreach by 03/05/15.  [name], BSW, MSW, [email or web address] Phone: [phone] Fax: [phone]

## 2015-03-01 NOTE — Progress Notes (Signed)
Medicine attending: Medical history, presenting problems, physical findings, and medications, reviewed with resident physician Dr Jacques Earthly on the day of the patient visit and I concur with her evaluation and management plan.

## 2015-03-01 NOTE — Assessment & Plan Note (Signed)
Assessment: Dark stools likely due to iron supplements/diet. FOBT negative.  Plan: -Continue to monitor

## 2015-03-01 NOTE — Assessment & Plan Note (Signed)
Lab Results  Component Value Date   HGBA1C 9.4 03/01/2015   HGBA1C 10.2* 10/06/2014   HGBA1C 10.3 07/03/2014     Assessment: Diabetes control: poor control (HgbA1C >9%) Progress toward A1C goal:  improved Comments: Denies episodes of hypoglycemia.  Plan: Medications: Continue Novolog sliding scale. Increase Lantus from 17u QHS to 20u QHS. Other plans:  -Follow up in 3-4 weeks

## 2015-03-01 NOTE — Progress Notes (Signed)
Subjective:    Patient ID: [name], female    DOB: [birth year], 82 y.o.   MRN: [id number]  HPI Ms. VIANEY Burns is a 82 year old woman with history of DM2, CVA, CHF, HTN, GERD, arthritis, meningioma, adenoCA of lung s/p R lower lobectomy in 2012 presenting for evaluation of melena.   Her daughter notes that she has had dark stools for the past couple weeks. She has had melena before about a year ago - her Xarelto was discontinued. She has some occasional dizziness and lightheadedness with standing. Some fatigue.   Review of Systems Constitutional: no fevers/chills Respiratory: no shortness of breath Cardiovascular: no chest pain Gastrointestinal: no nausea/vomiting, no abdominal pain  Past Medical History  Diagnosis Date  . Diabetes mellitus 2007    HgA1C (02/20/2010) = 9.2, HgA1C (03/20/2009) = 12.1  . CVA (cerebrovascular accident) ([place]) 6962     right embolic stroke, no residual deficits  . Diverticulitis   . CVA (cerebral infarction) 7-yrs ago  . Arthritis   . Dysphagia   . CHF (congestive heart failure) ([place])   . DIABETES MELLITUS, TYPE II 12/04/2005  . HYPERLIPIDEMIA 12/04/2005  . HYPERTENSION 12/04/2005  . GERD 12/04/2005  . ARTHRITIS, KNEE 03/24/2006  . Lung mass 07/08/2010  . Meningioma ([place]) 07/21/2010  . Hemangioma of liver 12/02/2010  . Depression 12/02/2010  . Adenocarcinoma of lung ([place])      Right upper lobe adenocarcinoma. s/p right lower lobectomy 12/15/10  . Brain cancer ([place])     7 cm cerebral right atypical meningoma grade II    Current Outpatient Prescriptions on File Prior to Visit  Medication Sig Dispense Refill  . acetaminophen-codeine (TYLENOL #3) 300-30 MG tablet TAKE 1 TABLET BY MOUTH EVERY 6 HOURS AS NEEDED MODERATE PAIN 90 tablet 0  . amLODipine-benazepril (LOTREL) 5-40 MG capsule TAKE 1 CAPSULE BY MOUTH DAILY. 30 capsule 2  . aspirin EC 81 MG tablet Take 81 mg by mouth daily.    . carvedilol (COREG) 25 MG tablet Take 1 tablet (25 mg  total) by mouth 2 (two) times daily with a meal. 180 tablet 4  . chlorhexidine (PERIDEX) 0.12 % solution Use as directed 15 mLs in the mouth or throat 2 (two) times daily.    . diclofenac sodium (VOLTAREN) 1 % GEL Apply 4 g topically 4 (four) times daily. (Patient taking differently: Apply 4 g topically 4 (four) times daily as needed (pain). ) 100 g 1  . feeding supplement, GLUCERNA SHAKE, (GLUCERNA SHAKE) LIQD Take 237 mLs by mouth 2 (two) times daily between meals.    Marland Kitchen FLUoxetine (PROZAC) 10 MG capsule TAKE 1 CAPSULE (10 MG TOTAL) BY MOUTH DAILY. 30 capsule 2  . glucose blood (ACCU-CHEK AVIVA) test strip Use to Check Blood Sugars 3 Times Daily.Dx Code: 250.00. Insulin dependent 100 each 12  . ibuprofen (ADVIL,MOTRIN) 400 MG tablet Take 1 tablet (400 mg total) by mouth every 6 (six) hours as needed. 120 tablet 2  . insulin aspart (NOVOLOG FLEXPEN) 100 UNIT/ML FlexPen Novolog Insulin: 91 - 160 5 units 161- 230 7 units 231-300 9 units 301- 370 11 units 371 - 440 13 units 441 - 510 15 units 15 mL 11  . Insulin Glargine (LANTUS SOLOSTAR) 100 UNIT/ML Solostar Pen Inject 17 Units into the skin at bedtime. (Patient taking differently: Inject 5 Units into the skin daily at 10 pm. ) 10 mL 2  . iron polysaccharides (FERREX 150) 150 MG capsule Take 1 capsule (150 mg total) by  mouth daily. 30 capsule 3  . levETIRAcetam (KEPPRA) 750 MG tablet TAKE 1 TABLET (750 MG TOTAL) BY MOUTH 2 (TWO) TIMES DAILY. 60 tablet 10  . NOVOLOG FLEXPEN 100 UNIT/ML FlexPen INJECT 2 UNITS < 70, 71-140 4 UNITS, 141-210 6 UNITS,211-280 INJECT 8 UNITS. SKIP IF SKIPPING MEAL 15 pen 5  . omeprazole (PRILOSEC) 20 MG capsule TAKE 1 CAPSULE (20 MG TOTAL) BY MOUTH DAILY. 30 capsule 5  . simvastatin (ZOCOR) 20 MG tablet TAKE 1 TABLET BY MOUTH EVERY DAY 90 tablet 3   No current facility-administered medications on file prior to visit.    Today's Vitals   03/01/15 1512  BP: 126/54  Pulse: 71  Temp: 98.1 F (36.7 C)    TempSrc: Oral  Height: '5\' 4"'$  (1.626 m)  Weight: 205 lb 9.6 oz (93.26 kg)  SpO2: 99%    Objective:   Physical Exam  Constitutional: She appears well-developed. No distress.  Abdominal: Soft. She exhibits no distension. There is no tenderness.  Genitourinary: Guaiac negative stool.  No hemorrhoids visualized. No masses palpated.   Assessment & Plan:  Please refer to problem based charting.

## 2015-03-01 NOTE — Assessment & Plan Note (Signed)
Assessment: Chronic knee pain not controlled with ibuprofen and tylenol #3. She has been getting Ibuprofen '400mg'$  BID and Tylenol #3 1 tablet BID.  Plan: -Discussed with daughter giving ibuprofen '400mg'$ , then 6 hours later giving 2 tablets of tylenol #3. Followed by ibuprofen '400mg'$  in 6 hours. Then another dose of 2 tablets of tylenol #3 in 6 hours. -Continue voltaren gel

## 2015-03-04 ENCOUNTER — Other Ambulatory Visit: Payer: Self-pay | Admitting: *Deleted

## 2015-03-04 ENCOUNTER — Other Ambulatory Visit: Payer: Self-pay | Admitting: Licensed Clinical Social Worker

## 2015-03-04 DIAGNOSIS — E1169 Type 2 diabetes mellitus with other specified complication: Secondary | ICD-10-CM

## 2015-03-04 NOTE — Patient Outreach (Signed)
[facility]) Care Management  03/04/2015  [name] 04/19/1933 [id number]   Assessment-CSW contacted RNCM in order to gain updates on patient. CSW has provided ongoing emotional support and available community resources to patient. CSW completed outreach to patient's daughter on 03/04/15. Daughter reports that she has not heard back from the landlord who she thought she would be able to rent from. Daughter states that landlord has not completed application for [employer]. Daughter expresses extreme frustration stating "[employer] should do more than give [place] first month's rent money. They aren't helping me find anywhere to go and I have until the Saturday to find somewhere. No one is helping me" Daughter states that she is not wanting for any additional resources from CSW. Daughter understands that she has been provided with numerous community resources for her to use. Daughter shares that she continues to look for a place to rent and that she will be going with her niece today to look at potential homes.   CSW completed additional outreach on 03/05/15 to daughter. Daughter states that she continues to search for stable housing. Daughter expresses anger for not hearing back from [employer] and [employer] after making several attempts over the past week. CSW informed patient that she will make outreach to [employer]. Daughter becomes very aggressive over the phone (which has become a habit) after CSW encouraged her to continue to search for potential houses. Daughter states "This isn't our fault that our house got condemned. No one is wanting to help [place]. I'm tired of looking for place to live. They should be helping me." CSW redirected daughter and was able to calm her down. CSW informed her that she has provided all resources to her for her to use and that there is not much more to be done except provide emotional  support and encourage patient safety. Daughter reports that patient is being appropriately cared for and is taking medications as prescribed and going to all medical appointments. Daughter expresses understanding that CSW will be closing patient case within the next week due to all social work resources being provided, educated on and encouraged.   CSW contacted [employer] and spoke to an Geophysicist/field seismologist. Assistant states that their supervisor who has been working with patient's case is in a meeting. Assistant states that she has just spoke to patient's daughter and reports "She was very abrasive over the phone and that makes it hard to work with her." Assistant reports "I understand her situation but I am not able to provide her with the information she needs until my supervisor is available." CSW asked information the case. Assistant states that patient's daughter was informed on 02/06/15 that [employer] would pay $1,200 for a deposit and first month's rent which the approved housing option list that were provided to her. Patient's daughter has until 03/09/15 to find stable housing. CSW informed assistant that patient's daughter has been searching for a home to rent since 02/06/15 and has been unable to find a place. Assistant states that patient's daughter has not contacted [employer] since 02/06/15 and that if they were aware that she was having a difficult time finding a home within the first week or two of searching then they would be able to better serve her and assist her in that search. Assistant states that she has informed patient's daughter that this is the only resource that they have available to her. Assistant agrees to  update supervisor and to ask her to contact this CSW once available in order to discuss an extension.   Plan-CSW will await call from [facility]. CSW will continue to provide emotional support to patient  and family.  Dickie La, BSW, MSW, LCSW [employer] [email or web address] Phone: [phone] Fax: [phone]

## 2015-03-04 NOTE — Patient Outreach (Signed)
Call placed to daughter to follow up on member's current health status and housing situation.  Daughter states that she has put in an application for a home to rent, but have not heard anything back from the landlord. Social worker, B. Blanch Media, has been actively working on providing resources and follow up regarding housing.    Daughter states that the member is "ok."  She reports that she had a visit with her PCP last week and her A1C remains elevated.  Her insulin has been increased, but daughter denies any concerns or need for assistance at this time.  Discussed again the possibility of having a health coach work with her to further manager the member's diabetes, daughter agrees.  Will send referral to health coach.   Valente David, BSN, [place] Management  [facility] Care Manager [phone]

## 2015-03-06 ENCOUNTER — Other Ambulatory Visit: Payer: Self-pay | Admitting: Licensed Clinical Social Worker

## 2015-03-06 NOTE — Patient Outreach (Signed)
[place] [facility]) Care Management  03/06/2015  [name] [birth year] [id number]   Assessment-CSW completed an additional outreach attempt to [name] after not receiving return call. CSW spoke to supervisor Renee in regards to patient's case. [name] states that she received my message inquiring to see if patient can receive an extension in order to gain stable housing. Renee reports that this was approved by her supervisor today and that patient's daughter is aware of this extension which will only be a 3 day extension period. Patient's daughter will need to have found a stable place to rent by 03/12/15 by 5:30 pm. [name] shares that patient's daughter was given two different list of rental properties to start her search from and that she was informed that a house she wishes to rent does not have to be on the list. [name] states that they are unable to direct their clients to any landlord as it is the client's decision to do so and to find a property of their own. Renee also went on to say that she suggested that patient's daughter ask family and friends if they knew of any available rental properties as well as they do not have the power to choose for their clients. Renee shared that she spent time explaining to patient's daughter why she would only be given 30 days to find a place to live in order to gain the $1,200 to assist with deposit and first month's rent as it is their process. Renee informed CSW that after 30 days it will not be considered an emergency anymore especially when they have been staying with someone else and have shelter currently. Renee reports that if patient's daughter is unable to find a place to rent by 03/12/15 then she will have to close the case.  CSW attempted outreach to patient's daughter to review these updates and to encourage her to take these next few days and dedicate all time to finding somewhere to rent. Patient's daughter did  not answer and voice mail full.  Plan-CSW will attempt additional outreach by 03/08/15. Patient and family have been provided with all community resources. CSW ensured that patient's daughter received an extension for [employer].   [name], BSW, MSW, [email or web address] Phone: [phone] Fax: [phone]

## 2015-03-11 ENCOUNTER — Other Ambulatory Visit: Payer: Self-pay | Admitting: Licensed Clinical Social Worker
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
## 2015-03-20 ENCOUNTER — Other Ambulatory Visit: Payer: Self-pay | Admitting: Licensed Clinical Social Worker

## 2015-03-20 ENCOUNTER — Other Ambulatory Visit: Payer: Self-pay

## 2015-03-20 NOTE — Patient Outreach (Signed)
[facility]) Care Management  03/20/2015  [name] April 22, 1933 [id number]   Telephone contact with patient's daughter.  She states they are in the process of moving, and that today was not a good day for me to speak with [name].  Their new address is [address], [place], [place] [address], and they will have the same phone number.  Set up an appointment for 03-26-15 at 1PM for my initial assessment.  [name], RN, MSN [place] [phone] Fax [phone]

## 2015-03-20 NOTE — Patient Outreach (Signed)
[facility]) Care Management  03/20/2015  [name] 1933-04-22 [id number]   Assessment-CSW received call from patient's daughter on 03/20/15. Daughter wished to thank CSW for assistance with finding stable housing. Daughter has found a house to move into and she will be gaining $1,200 for deposit and first month's rent from [employer]. Daughter reported that house was passed the inspection. CSW informed daughter to contact CSW if she has any further questions or concerns.  Plan-Patient's case continues to be closed by social work. Telephonic [facility] still involved.  [name], BSW, MSW, [name] Phone: [phone] Fax: [phone]

## 2015-03-26 ENCOUNTER — Other Ambulatory Visit: Payer: Self-pay

## 2015-03-26 DIAGNOSIS — E1169 Type 2 diabetes mellitus with other specified complication: Secondary | ICD-10-CM
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
## 2015-04-01 ENCOUNTER — Other Ambulatory Visit: Payer: Self-pay | Admitting: Internal Medicine

## 2015-04-01 DIAGNOSIS — M25561 Pain in right knee: Secondary | ICD-10-CM

## 2015-04-01 DIAGNOSIS — D509 Iron deficiency anemia, unspecified: Secondary | ICD-10-CM

## 2015-04-18 ENCOUNTER — Encounter: Payer: Medicare Other | Admitting: Internal Medicine

## 2015-04-23 ENCOUNTER — Other Ambulatory Visit: Payer: Self-pay

## 2015-04-23 DIAGNOSIS — E1169 Type 2 diabetes mellitus with other specified complication: Secondary | ICD-10-CM

## 2015-04-23 NOTE — Patient Outreach (Signed)
[facility]) Care Management  04/23/2015  [name] 1933-08-29 [id number]   Unsuccessful call for scheduled assessment.  There was no answer.  Unable to leave a message because the mail box was full.  RN will make another attempt within 5 working days.  [name], RN, MSN [place] [phone] Fax [phone]

## 2015-04-23 NOTE — Patient Outreach (Signed)
[facility]) Care Management  04/23/2015  [name] May 18, 1933 [id number]  Received a call back from daughter [name].  She reports they are now moved into their new home.  She reports the patient's nonfasting cbg was 188 this morning.  Daughter reports she has not yet received educational materials.  They were sent out to the new address last week.  Plan:  Patient will get order for new glucometer when she sees her PCP on the 16th.  In the mean time, the aide who                       comes each day will continue to check her cbg.           Daughter will review EMMI educational materials when they arrive.           RN will follow up in approximately one month.  [name], RN, MSN [place] [phone] Fax [phone]

## 2015-05-02 ENCOUNTER — Ambulatory Visit (INDEPENDENT_AMBULATORY_CARE_PROVIDER_SITE_OTHER): Payer: Medicare Other | Admitting: Internal Medicine

## 2015-05-02 ENCOUNTER — Encounter: Payer: Self-pay | Admitting: Internal Medicine

## 2015-05-02 VITALS — BP 143/72 | HR 85 | Temp 97.6°F | Ht 64.0 in | Wt 202.8 lb

## 2015-05-02 DIAGNOSIS — Z9181 History of falling: Secondary | ICD-10-CM

## 2015-05-02 DIAGNOSIS — M25561 Pain in right knee: Secondary | ICD-10-CM

## 2015-05-02 DIAGNOSIS — E1165 Type 2 diabetes mellitus with hyperglycemia: Secondary | ICD-10-CM | POA: Diagnosis present

## 2015-05-02 DIAGNOSIS — Z794 Long term (current) use of insulin: Secondary | ICD-10-CM | POA: Diagnosis not present

## 2015-05-02 DIAGNOSIS — E113299 Type 2 diabetes mellitus with mild nonproliferative diabetic retinopathy without macular edema, unspecified eye: Secondary | ICD-10-CM

## 2015-05-02 DIAGNOSIS — Z789 Other specified health status: Secondary | ICD-10-CM | POA: Diagnosis not present

## 2015-05-02 LAB — GLUCOSE, CAPILLARY: GLUCOSE-CAPILLARY: 230 mg/dL — AB (ref 65–99)

## 2015-05-02 MED ORDER — MELOXICAM 7.5 MG PO TABS: 7.5000 mg | ORAL_TABLET | Freq: Every day | ORAL | Status: DC

## 2015-05-02 MED ORDER — INSULIN ASPART 100 UNIT/ML FLEXPEN: PEN_INJECTOR | SUBCUTANEOUS | Status: DC

## 2015-05-02 NOTE — Assessment & Plan Note (Signed)
A: Continues to have 24 hour supervision with daughter. Family his having difficulty affording Depends  P: SW consult to look into options for reducing costs of Depends.

## 2015-05-02 NOTE — Assessment & Plan Note (Signed)
A: Patient continues to have knee pain despite daily ibuprofen and tylenol #3. We will switch out ibuprofen for a short course alternative NSAID to see if she gains an improvement. I told her not to take iboprofen and meloxicam on the same day.  P: Meloxicam 7.5 mg daily, not to be taken on the same days as ibuprofen for a 7 day course

## 2015-05-02 NOTE — Assessment & Plan Note (Signed)
A: Patient's last A1c 2 months ago was 9.4, over goal of 8.0. This is overall an improvement from 7 months and 10 mo ago; however, there is room for improvement. BG this visit in the 230s. By adding back the sliding scale Aspart, we should be able to achieve better glycemic control. Re-prescribing her meter and testing strips should enable this. I will reassess her progress in 1 month.  P:  - Prescribe meter, testing strips - Continue lantus 20U qhs - Encouraged to restart aspart insulin sliding scale as described below:  91 - 160 5 units 161- 230 7 units 231-300 9 units 301- 370 11 units 371 - 440 13 units 441 - 510 15 units

## 2015-05-02 NOTE — Assessment & Plan Note (Signed)
A: Home safety assessment had recommendations, which I find reasonable.  P: - Elevated commode seat - Tub bench
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
## 2015-05-02 NOTE — Patient Instructions (Signed)
[name], it was a joy seeing you today.  For your diabetes, we have reordered for glucometer and testing strips. Please bring this with you for your next visit in a month. You can resume the sliding scale insulin as outlined on your prescription.  We have also ordered some equipment to decrease your risk of falls in the home.  For your right knee pain, you will take Meloxicam (aka Mobic) once a day for 7 days. Do not take it on days that you take ibuprofen.  We'll see you in a month.

## 2015-05-03 NOTE — Progress Notes (Signed)
Internal Medicine Clinic Attending  Case discussed with Dr. Ford at the time of the visit.  We reviewed the resident's history and exam and pertinent patient test results.  I agree with the assessment, diagnosis, and plan of care documented in the resident's note.  

## 2015-05-07 MED ORDER — INCONTINENCE SUPPLIES MISC: Status: DC

## 2015-05-07 NOTE — Addendum Note (Signed)
Addended by: Zetta Bills on: 05/07/2015 10:11 PM   Modules accepted: Orders

## 2015-05-08 ENCOUNTER — Other Ambulatory Visit: Payer: Self-pay | Admitting: Internal Medicine

## 2015-05-08 NOTE — Addendum Note (Signed)
Addended by: Zetta Bills on: 05/08/2015 01:15 PM   Modules accepted: Orders

## 2015-05-14 ENCOUNTER — Telehealth: Payer: Self-pay | Admitting: Internal Medicine

## 2015-05-14 NOTE — Telephone Encounter (Signed)
Patient's request for a Shower Chair, Incontinence Supplies and Elevated Commode Seat have been faxed to [facility] DME Department.  Also re-faxed patient's Incontinence Supplies as urgent to [facility].  Please have patient to call [employer] @ [phone] and press the prompt for Supplies or they can speak with a Representative.

## 2015-05-21 ENCOUNTER — Other Ambulatory Visit: Payer: Self-pay

## 2015-05-21 NOTE — Patient Outreach (Signed)
[place] [facility]) Care Management  05/21/2015  TERRILEE DUDZIK 23-Feb-1933 [id number]   Unsuccessful attempt to reach patient for scheduled contact.  HIPPA appropriate message left requesting call back. If no response RN will make another attempt within 5 working days.  [name], RN, MSN [place] [phone] Fax [phone]

## 2015-05-23 ENCOUNTER — Other Ambulatory Visit: Payer: Self-pay

## 2015-05-23 DIAGNOSIS — E1169 Type 2 diabetes mellitus with other specified complication: Secondary | ICD-10-CM

## 2015-05-23 NOTE — Patient Outreach (Signed)
[facility]) Care Management  05/23/2015  AVANGELINA FLIGHT Jul 10, 1933 [id number]   Telephonic assessment conducted with daughter [name].  She reports patient saw PCP Marijean Bravo) on 05-02-15.  He ordered Meloxicon for the patient's knee pain, and it has helped.  Her weight remains at 202 pounds.  They are still waiting for delivery of a new glucometer.  [name] stated that the [facility] materials on meal planning and eye care have arrived, and that they were helpful.  RN will follow up in approximately one month.  [name], RN, MSN [place] [phone] Fax [phone]

## 2015-06-01 ENCOUNTER — Other Ambulatory Visit: Payer: Self-pay | Admitting: Internal Medicine

## 2015-06-01 DIAGNOSIS — I10 Essential (primary) hypertension: Secondary | ICD-10-CM

## 2015-06-01 DIAGNOSIS — F329 Major depressive disorder, single episode, unspecified: Secondary | ICD-10-CM

## 2015-06-01 DIAGNOSIS — F32A Depression, unspecified: Secondary | ICD-10-CM

## 2015-06-05 ENCOUNTER — Telehealth: Payer: Self-pay | Admitting: Internal Medicine

## 2015-06-05 NOTE — Telephone Encounter (Signed)
APPT. REMINDER CALL, NO ANSWER

## 2015-06-06 ENCOUNTER — Encounter: Payer: Medicare Other | Admitting: Internal Medicine

## 2015-06-13 ENCOUNTER — Telehealth: Payer: Self-pay | Admitting: Pharmacist

## 2015-06-13 NOTE — Telephone Encounter (Signed)
Contacted patient on behalf of Geriatrics Task Force and spoke with her daughter [name], who helps at home with patient's medications. [name] states they are doing well managing medications and will see if they have any questions to bring to PCP appointment 07/04/15. No concerns at this time.

## 2015-06-15 ENCOUNTER — Other Ambulatory Visit: Payer: Self-pay | Admitting: Internal Medicine

## 2015-06-15 DIAGNOSIS — M25561 Pain in right knee: Secondary | ICD-10-CM

## 2015-06-15 DIAGNOSIS — I10 Essential (primary) hypertension: Secondary | ICD-10-CM

## 2015-06-17 NOTE — Telephone Encounter (Signed)
Last appointment 4/15 ne

## 2015-06-18 NOTE — Telephone Encounter (Signed)
Spoke with patient's daughter that has POA. Informed her that Coreg and Tylenol #3 were called into the [facility] pharmacy and should be ready for pick up.

## 2015-06-20 ENCOUNTER — Other Ambulatory Visit: Payer: Self-pay

## 2015-06-20 DIAGNOSIS — E1169 Type 2 diabetes mellitus with other specified complication: Secondary | ICD-10-CM

## 2015-06-20 NOTE — Patient Outreach (Signed)
[facility]) Care Management  06/20/2015  [name] [birth year] [id number]   Telephonic contact conducted with daughter [name].  She reports patient doing well.  Knee pain is improved- she is taking Tylenol-codiene prn.  Patient now has new glucometer, and reports checking daily cbgs. Daughter reports one non-fasting reading of 345, and today's fasting reading was 130.    Plan:  Patient will keep PCP appointment on 07-04-15, and schedule an eye appointment.           Patient will take daily cbg and take her glucometer with her to PCP for read-out of results.           RN will mail EMMI materials on signs of hypo/hyperglycemia for patient and daughter to review.           RN will follow up in approximately one month.  [name], RN, MSN [place] [phone] Fax [phone]

## 2015-07-04 ENCOUNTER — Encounter: Payer: Self-pay | Admitting: Internal Medicine

## 2015-07-04 ENCOUNTER — Ambulatory Visit (INDEPENDENT_AMBULATORY_CARE_PROVIDER_SITE_OTHER): Payer: Medicare Other | Admitting: Internal Medicine

## 2015-07-04 VITALS — BP 145/62 | HR 88 | Temp 98.6°F | Wt 200.9 lb

## 2015-07-04 DIAGNOSIS — I69398 Other sequelae of cerebral infarction: Secondary | ICD-10-CM

## 2015-07-04 DIAGNOSIS — M25561 Pain in right knee: Secondary | ICD-10-CM

## 2015-07-04 DIAGNOSIS — E1165 Type 2 diabetes mellitus with hyperglycemia: Secondary | ICD-10-CM

## 2015-07-04 DIAGNOSIS — M1711 Unilateral primary osteoarthritis, right knee: Secondary | ICD-10-CM | POA: Diagnosis not present

## 2015-07-04 DIAGNOSIS — Z789 Other specified health status: Secondary | ICD-10-CM

## 2015-07-04 DIAGNOSIS — Z794 Long term (current) use of insulin: Secondary | ICD-10-CM | POA: Diagnosis not present

## 2015-07-04 DIAGNOSIS — R6889 Other general symptoms and signs: Secondary | ICD-10-CM

## 2015-07-04 DIAGNOSIS — E1139 Type 2 diabetes mellitus with other diabetic ophthalmic complication: Secondary | ICD-10-CM | POA: Insufficient documentation

## 2015-07-04 DIAGNOSIS — E11329 Type 2 diabetes mellitus with mild nonproliferative diabetic retinopathy without macular edema: Secondary | ICD-10-CM

## 2015-07-04 LAB — GLUCOSE, CAPILLARY: GLUCOSE-CAPILLARY: 239 mg/dL — AB (ref 65–99)

## 2015-07-04 LAB — POCT GLYCOSYLATED HEMOGLOBIN (HGB A1C): HEMOGLOBIN A1C: 10.1

## 2015-07-04 MED ORDER — INSULIN GLARGINE 100 UNIT/ML SOLOSTAR PEN: 22.0000 [IU] | PEN_INJECTOR | Freq: Every day | SUBCUTANEOUS | Status: DC

## 2015-07-04 MED ORDER — LIRAGLUTIDE 18 MG/3ML ~~LOC~~ SOPN: PEN_INJECTOR | SUBCUTANEOUS | Status: DC

## 2015-07-04 MED ORDER — GLUCOSE BLOOD VI STRP: ORAL_STRIP | Status: DC

## 2015-07-04 MED ORDER — INCONTINENCE SUPPLIES MISC: Status: AC

## 2015-07-04 MED ORDER — ACCU-CHEK NANO SMARTVIEW W/DEVICE KIT: PACK | Status: DC

## 2015-07-04 MED ORDER — INSULIN PEN NEEDLE 31G X 8 MM MISC: Status: DC

## 2015-07-04 NOTE — Patient Instructions (Addendum)
[name], [name] to see you today.  For your diabetes, we are increasing your Lantus from 20U in the evening to 22U. We are also adding a new medication called Liraglutide, you will take 0.6 mg Liraglutide every morning for the first week, followed by 1.2 mg every morning after that.   For your right knee pain, you can take the meloxicam you have for 7 days at a time. Be sure to wait a week before using it again.  I have tried to prescribe some adult diapers again. Hopefully it will work.  Hypoglycemia Hypoglycemia occurs when the glucose in your blood is too low. Glucose is a type of sugar that is your body's main energy source. Hormones, such as insulin and glucagon, control the level of glucose in the blood. Insulin lowers blood glucose and glucagon increases blood glucose. Having too much insulin in your blood stream, or not eating enough food containing sugar, can result in hypoglycemia. Hypoglycemia can happen to people with or without diabetes. It can develop quickly and can be a medical emergency.  CAUSES   Missing or delaying meals.  Not eating enough carbohydrates at meals.  Taking too much diabetes medicine.  Not timing your oral diabetes medicine or insulin doses with meals, snacks, and exercise.  Nausea and vomiting.  Certain medicines.  Severe illnesses, such as hepatitis, kidney disorders, and certain eating disorders.  Increased activity or exercise without eating something extra or adjusting medicines.  Drinking too much alcohol.  A nerve disorder that affects body functions like your heart rate, blood pressure, and digestion (autonomic neuropathy).  A condition where the stomach muscles do not function properly (gastroparesis). Therefore, medicines and food may not absorb properly.  Rarely, a tumor of the pancreas can produce too much insulin. SYMPTOMS   Hunger.  Sweating (diaphoresis).  Change in body  temperature.  Shakiness.  Headache.  Anxiety.  Lightheadedness.  Irritability.  Difficulty concentrating.  Dry mouth.  Tingling or numbness in the hands or feet.  Restless sleep or sleep disturbances.  Altered speech and coordination.  Change in mental status.  Seizures or prolonged convulsions.  Combativeness.  Drowsiness (lethargic).  Weakness.  Increased heart rate or palpitations.  Confusion.  Pale, gray skin color.  Blurred or double vision.  Fainting. DIAGNOSIS  A physical exam and medical history will be performed. Your caregiver may make a diagnosis based on your symptoms. Blood tests and other lab tests may be performed to confirm a diagnosis. Once the diagnosis is made, your caregiver will see if your signs and symptoms go away once your blood glucose is raised.  TREATMENT  Usually, you can easily treat your hypoglycemia when you notice symptoms.  Check your blood glucose. If it is less than 70 mg/dl, take one of the following:   3-4 glucose tablets.    cup juice.    cup regular soda.   1 cup skim milk.   -1 tube of glucose gel.   5-6 hard candies.   Avoid high-fat drinks or food that may delay a rise in blood glucose levels.  Do not take more than the recommended amount of sugary foods, drinks, gel, or tablets. Doing so will cause your blood glucose to go too high.   Wait 10-15 minutes and recheck your blood glucose. If it is still less than 70 mg/dl or below your target range, repeat treatment.   Eat a snack if it is more than 1 hour until your next meal.  There may  be a time when your blood glucose may go so low that you are unable to treat yourself at home when you start to notice symptoms. You may need someone to help you. You may even faint or be unable to swallow. If you cannot treat yourself, someone will need to bring you to the hospital.  [place]  If you have diabetes, follow your diabetes management  plan by:  Taking your medicines as directed.  Following your exercise plan.  Following your meal plan. Do not skip meals. Eat on time.  Testing your blood glucose regularly. Check your blood glucose before and after exercise. If you exercise longer or different than usual, be sure to check blood glucose more frequently.  Wearing your medical alert jewelry that says you have diabetes.  Identify the cause of your hypoglycemia. Then, develop ways to prevent the recurrence of hypoglycemia.  Do not take a hot bath or shower right after an insulin shot.  Always carry treatment with you. Glucose tablets are the easiest to carry.  If you are going to drink alcohol, drink it only with meals.  Tell friends or family members ways to keep you safe during a seizure. This may include removing hard or sharp objects from the area or turning you on your side.  Maintain a healthy weight. SEEK MEDICAL CARE IF:   You are having problems keeping your blood glucose in your target range.  You are having frequent episodes of hypoglycemia.  You feel you might be having side effects from your medicines.  You are not sure why your blood glucose is dropping so low.  You notice a change in vision or a new problem with your vision. SEEK IMMEDIATE MEDICAL CARE IF:   Confusion develops.  A change in mental status occurs.  The inability to swallow develops.  Fainting occurs.   This information is not intended to replace advice given to you by your health care provider. Make sure you discuss any questions you have with your health care provider.   Document Released: 02/02/2005 Document Revised: 02/07/2013 Document Reviewed: 10/09/2014 Elsevier Interactive Patient Education [employer].

## 2015-07-04 NOTE — Progress Notes (Signed)
   Subjective:    Patient ID: [name], female    DOB: 09-04-1933, 82 y.o.   MRN: [id number]  HPI  [name] is an 82 year old woman with a PMH of stroke, seizures, lung adenocarcinoma who comes to the clinic to discuss her T2DM, right knee pain, and mixed incontinence. She is joined by her daughter today, who is her primary caregiver.  The patient's daughter has been administering 20U Lantus qhs. They have forgone the prescribed short-acting insulin due to not having a meter, which was lost when they were evicted. She is unable to tolerate metformin and other oral therapies due to GI problems. They have not noticed any light-headedness, heart palpitations, or confusion. The patient has had polyuria, but denies polyphagia and polydipsia.  She has chronic right knee pain due to osteoarthritis. She has taken Tylenol #3 for this. She is agreeable to trying a topical NSAID today.  The patient has difficulty making it to the bathroom in time due to mixed urinary incontience. She is requesting adult diapers today.   Review of Systems  Constitutional: Negative for fever and fatigue.  HENT: Negative for congestion and sore throat.  Eyes: Negative for photophobia and visual disturbance.  Respiratory: Negative for cough, shortness of breath and wheezing.  Cardiovascular: Negative for chest pain and palpitations.  Genitourinary: Negative for dysuria, urgency and difficulty urinating.  Musculoskeletal: Positive for arthralgias. Negative for back pain.  Neurological: Negative for dizziness, light-headedness, numbness and headaches.    Objective:   Physical Exam  Constitutional: She appears well-developed and well-nourished. No distress.  HENT:  Head: Normocephalic and atraumatic.  Mouth/Throat: Oropharynx is clear and moist. No oropharyngeal exudate.  Eyes: EOM are normal. Pupils are equal, round, and reactive to light. No scleral icterus.  Neck: Neck supple.  Cardiovascular: Normal  rate, regular rhythm and normal heart sounds.  Pulmonary/Chest: Effort normal and breath sounds normal. No respiratory distress. She has no wheezes.  Abdominal: Soft. Bowel sounds are normal. She exhibits no distension. There is no tenderness.  Musculoskeletal:  Painful extension and flexion of right knees.  Vitals reviewed.     Assessment & Plan:   Please see problem based assessment and plan for details.

## 2015-07-05 NOTE — Assessment & Plan Note (Signed)
A: Patient continues to have right knee pain due to OA  P: Meloxicam 7.5 mg daily x 7 days

## 2015-07-05 NOTE — Assessment & Plan Note (Signed)
A: A1c is higher to 10.1 from 9.4 four months ago. This is understandable as she has not had her meter. She has only been taking long-acting insulin. Oral medications, such as metformin, are not an option as she has not tolerated the GI upset in the past. She would likely benefit from a GLP-1 agonist. We will hold off on starting a short-acting insulin for now for the sliding scale previously.  P: Re-order glucometer Increase lantus from 20U qhs to 22U Add liraglutide 0.6 mg daily for week 1, 1.2 mg daily thereafter, counseled on GI effects and that they will subside

## 2015-07-05 NOTE — Assessment & Plan Note (Signed)
A: Patient has difficulty making it to the bathroom due to her previous stroke  P: Adult incontinence supplies ordered

## 2015-07-08 ENCOUNTER — Telehealth: Payer: Self-pay | Admitting: Pharmacist

## 2015-07-08 ENCOUNTER — Other Ambulatory Visit: Payer: Self-pay | Admitting: Internal Medicine

## 2015-07-08 DIAGNOSIS — E785 Hyperlipidemia, unspecified: Secondary | ICD-10-CM

## 2015-07-08 NOTE — Progress Notes (Signed)
Internal Medicine Clinic Attending  Case discussed with Dr. Ford at the time of the visit.  We reviewed the resident's history and exam and pertinent patient test results.  I agree with the assessment, diagnosis, and plan of care documented in the resident's note.  

## 2015-07-08 NOTE — Telephone Encounter (Signed)
Contacted patient on behalf of [employer] and spoke with her daughter [name], who helps at home with patient's medications. No concerns at this time.

## 2015-07-18 ENCOUNTER — Other Ambulatory Visit: Payer: Self-pay

## 2015-07-18 VITALS — Wt 200.0 lb

## 2015-07-18 DIAGNOSIS — E1169 Type 2 diabetes mellitus with other specified complication: Secondary | ICD-10-CM

## 2015-07-18 NOTE — Patient Outreach (Signed)
[place] [facility]) Care Management  07/18/2015  [name] 01-05-1934 [id number]  Telephone contact today conducted with patient's daughter [name].  Patient was seen by PCP on 07-04-15.  Her A1C was rechecked, and it has increased from 9.4 to 10.1.  Medication changes were made, and daughter reports cbgs have improved and have not been over 150.  Plan:  Patient/daughter will continue cbg monitoring and taking medications as ordered.           RN will follow up in approximately one month.              [name], RN, MSN [place] [phone] Fax [phone]

## 2015-07-22 ENCOUNTER — Other Ambulatory Visit: Payer: Self-pay | Admitting: *Deleted

## 2015-07-22 DIAGNOSIS — K219 Gastro-esophageal reflux disease without esophagitis: Secondary | ICD-10-CM

## 2015-07-22 DIAGNOSIS — F32A Depression, unspecified: Secondary | ICD-10-CM

## 2015-07-22 DIAGNOSIS — I10 Essential (primary) hypertension: Secondary | ICD-10-CM

## 2015-07-22 DIAGNOSIS — F329 Major depressive disorder, single episode, unspecified: Secondary | ICD-10-CM

## 2015-07-22 MED ORDER — OMEPRAZOLE 20 MG PO CPDR: DELAYED_RELEASE_CAPSULE | ORAL | Status: DC

## 2015-07-22 MED ORDER — FLUOXETINE HCL 10 MG PO CAPS: ORAL_CAPSULE | ORAL | Status: DC

## 2015-07-22 MED ORDER — AMLODIPINE BESY-BENAZEPRIL HCL 5-40 MG PO CAPS: 1.0000 | ORAL_CAPSULE | Freq: Every day | ORAL | Status: DC

## 2015-07-22 NOTE — Telephone Encounter (Signed)
Received 90 day prescription request from pt's pharmacy.  Will send to pcp for review, please advise.[name]6/5/20172:31 PM

## 2015-07-31 ENCOUNTER — Telehealth: Payer: Self-pay

## 2015-08-07 ENCOUNTER — Ambulatory Visit: Payer: Medicare Other | Admitting: Internal Medicine

## 2015-08-14 ENCOUNTER — Ambulatory Visit (INDEPENDENT_AMBULATORY_CARE_PROVIDER_SITE_OTHER): Payer: Medicare Other | Admitting: Internal Medicine

## 2015-08-14 ENCOUNTER — Encounter: Payer: Self-pay | Admitting: *Deleted

## 2015-08-14 ENCOUNTER — Encounter: Payer: Self-pay | Admitting: Internal Medicine

## 2015-08-14 ENCOUNTER — Other Ambulatory Visit: Payer: Self-pay

## 2015-08-14 VITALS — BP 144/65 | HR 72 | Temp 98.1°F | Ht 64.0 in | Wt 209.0 lb

## 2015-08-14 DIAGNOSIS — E119 Type 2 diabetes mellitus without complications: Secondary | ICD-10-CM

## 2015-08-14 DIAGNOSIS — Z794 Long term (current) use of insulin: Secondary | ICD-10-CM | POA: Diagnosis not present

## 2015-08-14 DIAGNOSIS — E1169 Type 2 diabetes mellitus with other specified complication: Secondary | ICD-10-CM

## 2015-08-14 DIAGNOSIS — M25561 Pain in right knee: Secondary | ICD-10-CM | POA: Diagnosis not present

## 2015-08-14 LAB — GLUCOSE, CAPILLARY: GLUCOSE-CAPILLARY: 116 mg/dL — AB (ref 65–99)

## 2015-08-14 MED ORDER — MELOXICAM 7.5 MG PO TABS: 7.5000 mg | ORAL_TABLET | Freq: Every day | ORAL | Status: DC

## 2015-08-14 MED ORDER — ACETAMINOPHEN-CODEINE #3 300-30 MG PO TABS: 1.0000 | ORAL_TABLET | Freq: Two times a day (BID) | ORAL | Status: DC | PRN

## 2015-08-14 NOTE — Patient Outreach (Signed)
[place] [facility]) Care Management  08/14/2015  [name] [birth year] [id number]   Telephone call for scheduled contact.  Daughter [name] answered, but did not have time to talk.  She reported she was taking her mother for an appointment with Dr. Marijean Bravo.  She did state that her mother was doing 'OK'.  Contact rescheduled for next week.  [name], RN, MSN [place] [phone] Fax [phone]

## 2015-08-14 NOTE — Progress Notes (Signed)
   Subjective:    Patient ID: [name], female    DOB: 02/07/1934, 82 y.o.   MRN: [id number]  HPI [name] is a 82yo woman with PMHx of HTN, chronic diastolic CHF, and type 2 diabetes who presents today for follow up of her diabetes. Her daughter accompanied her to the visit and provided some of the history.  Type 2 DM: Last A1c 10.1. She is taking Lantus 22 units daily and Liraglutide 1.2 mg daily. She did not bring her glucometer today. Per her daughter, her blood sugars have been in good range in the 130s-140s. Daughter states she had one reading in the 74s and one in the 300s but cannot tell me what times these occurred.   Bilateral Thigh/Knee Pain: Reports she has had bilateral hip/leg and knee pain "for years." She is currently taking Tylenol #3 BID which provides some relief. She notes she was started on Meloxicam 7.5 mg daily, but there was some confusion about the medication and refills so she has not been taking this for the last month as she ran out. She notes this significantly helped reduce her pain. She reports the voltaren gel does not help at all. She ambulates with a cane and does not have difficulty.    Review of Systems General: Denies fever, chills, night sweats, changes in weight, changes in appetite HEENT: Denies headaches, ear pain, changes in vision, rhinorrhea, sore throat CV: Denies CP, palpitations, SOB, orthopnea Pulm: Denies SOB, cough, wheezing GI: Denies abdominal pain, nausea, vomiting, diarrhea, constipation, melena, hematochezia GU: Denies dysuria, hematuria, frequency Msk: Denies muscle cramps Neuro: Denies weakness, numbness, tingling Skin: Denies rashes, bruising Psych: Denies depression, anxiety, hallucinations    Objective:   Physical Exam General: alert, sitting up, NAD HEENT: [place]/AT, EOMI, sclera anicteric, mucus membranes moist CV: RRR, no m/g/r Pulm: CTA bilaterally, breaths non-labored Ext: warm, no peripheral edema. Mild tenderness to  palpation of bilateral upper legs and knees, more tenderness on right side. Neuro: alert and oriented x 3    Assessment & Plan:  Please refer to A&P documentation.

## 2015-08-14 NOTE — Patient Instructions (Signed)
General Instructions: - Please bring your glucometer to your next visit. It is important for [place] to see your blood sugar log. - Refills on Tylenol #3 and Meloxicam given - Follow up in 1 month to reassess blood sugars   Please bring your medicines with you each time you come to clinic.  Medicines may include prescription medications, over-the-counter medications, herbal remedies, eye drops, vitamins, or other pills.   Progress Toward Treatment Goals:  Treatment Goal 03/01/2015  Hemoglobin A1C improved  Blood pressure at goal    Self Care Goals & Plans:  Self Care Goal 07/04/2015  Manage my medications take my medicines as prescribed; bring my medications to every visit; refill my medications on time  Monitor my health keep track of my blood pressure; bring my glucose meter and log to each visit; keep track of my blood glucose  Eat healthy foods eat more vegetables; eat foods that are low in salt; eat baked foods instead of fried foods  Be physically active find an activity I enjoy  Meeting treatment goals -    Home Blood Glucose Monitoring 03/23/2014  Check my blood sugar 3 times a day  When to check my blood sugar before meals     Care Management & Community Referrals:  Referral 10/13/2013  Referrals made for care management support social worker

## 2015-08-15 ENCOUNTER — Telehealth: Payer: Self-pay | Admitting: Pharmacist

## 2015-08-15 NOTE — Telephone Encounter (Signed)
Contacted patient for follow up to offer help with medications. Spoke to patient's daughter, [name], who helps at home with patient's medications. Daughter states no assistance needed at this time, tolerating liraglutide (Victoza) well which was initiated about 1 month ago. Contacted pharmacy and patient is past due for refills on amlodipine-benazepril, carvedilol, insulin glargine, liraglutide, and simvastatin. Pharmacist states she will refill these all together for patient and daughter is aware. Advised daughter to contact me if further help is needed with medication and she verbalized understanding.

## 2015-08-15 NOTE — Progress Notes (Signed)
Medicine attending: Medical history, presenting problems, physical findings, and medications, reviewed with resident physician Dr Albin Felling on the day of the patient visit and I concur with her evaluation and management plan.

## 2015-08-15 NOTE — Assessment & Plan Note (Signed)
Patient did bring her glucometer so unable to assess blood sugars. Per daughter, blood sugars are in good range but I am unable to verify this information. Cannot make any medication changes today. Instructed patient and daughter they must bring in the glucometer next visit. Follow up in 1 month.

## 2015-08-15 NOTE — Assessment & Plan Note (Addendum)
Refilled her Tylenol #3 BID #60 tablets and also restarted Meloxicam 7.5 mg daily. Will need to reassess her pain level at next visit now that Meloxicam was restarted. Would avoid any narcotics more potent than Tylenol #3 in this elderly woman.

## 2015-08-21 ENCOUNTER — Other Ambulatory Visit: Payer: Self-pay | Admitting: Internal Medicine

## 2015-08-22 ENCOUNTER — Other Ambulatory Visit: Payer: Self-pay | Admitting: Internal Medicine

## 2015-08-22 ENCOUNTER — Other Ambulatory Visit: Payer: Self-pay

## 2015-08-22 DIAGNOSIS — E1169 Type 2 diabetes mellitus with other specified complication: Secondary | ICD-10-CM

## 2015-08-22 NOTE — Telephone Encounter (Deleted)
Last visit: 6/28 Next appointment: 7/25 Last UDS: 10/06/2013 Last written: 08/14/15

## 2015-08-22 NOTE — Patient Outreach (Signed)
[place] [facility]) Care Management  08/22/2015  [name] [birth year] [id number]   Telephone contact with patient (spoke with daughter [name]).  [name] reports that cbgs have improved with no readings over 134. Daughter was not able to name any recommended foods for following diabetic dietary guidelines. Reinforced teaching with daughter about limiting sweets/carbohydrates.  Encouraged daughter to review EMMI materials mailed previously on diet/meal planning.  Plan:  Daughter to continue monitoring cbgs and giving meds as ordered.           Daughter will review EMMI materials sent previously.           RN will mail carbohydrate foods poster.           RN will follow up in approximately one month.  [name], RN, MSN [place] [phone] Fax [phone]

## 2015-08-28 ENCOUNTER — Ambulatory Visit (HOSPITAL_COMMUNITY)
Admission: EM | Admit: 2015-08-28 | Discharge: 2015-08-28 | Disposition: A | Discharge status: Home / Self Care | Payer: Medicare Other | Attending: Family Medicine | Admitting: Family Medicine

## 2015-08-28 ENCOUNTER — Other Ambulatory Visit: Payer: Self-pay

## 2015-08-28 ENCOUNTER — Encounter (HOSPITAL_COMMUNITY): Payer: Self-pay | Admitting: Emergency Medicine

## 2015-08-28 DIAGNOSIS — R110 Nausea: Secondary | ICD-10-CM | POA: Diagnosis not present

## 2015-08-28 DIAGNOSIS — K2970 Gastritis, unspecified, without bleeding: Secondary | ICD-10-CM | POA: Diagnosis not present

## 2015-08-28 DIAGNOSIS — M25561 Pain in right knee: Secondary | ICD-10-CM

## 2015-08-28 LAB — POCT I-STAT, CHEM 8
BUN: 19 mg/dL (ref 6–20)
CALCIUM ION: 1.18 mmol/L (ref 1.12–1.23)
CHLORIDE: 106 mmol/L (ref 101–111)
Creatinine, Ser: 0.9 mg/dL (ref 0.44–1.00)
GLUCOSE: 100 mg/dL — AB (ref 65–99)
HCT: 39 % (ref 36.0–46.0)
Hemoglobin: 13.3 g/dL (ref 12.0–15.0)
Potassium: 3.8 mmol/L (ref 3.5–5.1)
SODIUM: 145 mmol/L (ref 135–145)
TCO2: 29 mmol/L (ref 0–100)

## 2015-08-28 MED ORDER — ONDANSETRON 4 MG PO TBDP: 4.0000 mg | ORAL_TABLET | Freq: Three times a day (TID) | ORAL | Status: DC | PRN

## 2015-08-28 MED ORDER — ONDANSETRON 4 MG PO TBDP
ORAL_TABLET | ORAL | Status: AC
  Filled 2015-08-28: qty 1

## 2015-08-28 MED ORDER — GI COCKTAIL ~~LOC~~
30.0000 mL | Freq: Once | ORAL | Status: AC
  Administered 2015-08-28: 30 mL via ORAL

## 2015-08-28 MED ORDER — ONDANSETRON 4 MG PO TBDP
4.0000 mg | ORAL_TABLET | Freq: Once | ORAL | Status: AC
  Administered 2015-08-28: 4 mg via ORAL

## 2015-08-28 MED ORDER — SUCRALFATE 1 G PO TABS: 1.0000 g | ORAL_TABLET | Freq: Three times a day (TID) | ORAL | Status: DC

## 2015-08-28 MED ORDER — GI COCKTAIL ~~LOC~~
ORAL | Status: AC
  Filled 2015-08-28: qty 30

## 2015-08-28 NOTE — Telephone Encounter (Signed)
Called pharmacy, has refill, they will get ready

## 2015-08-28 NOTE — ED Notes (Signed)
The patient presented to the [facility] with her family with a complaint of upper abdominal pain x 8 days. The patient stated that she was able to empty her bowels as normal.

## 2015-08-28 NOTE — Discharge Instructions (Signed)
Gastritis, Adult Gastritis is soreness and puffiness (inflammation) of the lining of the stomach. If you do not get help, gastritis can cause bleeding and sores (ulcers) in the stomach. HOME CARE   Only take medicine as told by your doctor.  If you were given antibiotic medicines, take them as told. Finish the medicines even if you start to feel better.  Drink enough fluids to keep your pee (urine) clear or pale yellow.  Avoid foods and drinks that make your problems worse. Foods you may want to avoid include:  Caffeine or alcohol.  Chocolate.  Mint.  Garlic and onions.  Spicy foods.  Citrus fruits, including oranges, lemons, or limes.  Food containing tomatoes, including sauce, chili, salsa, and pizza.  Fried and fatty foods.  Eat small meals throughout the day instead of large meals. GET HELP RIGHT AWAY IF:   You have black or dark red poop (stools).  You throw up (vomit) blood. It may look like coffee grounds.  You cannot keep fluids down.  Your belly (abdominal) pain gets worse.  You have a fever.  You do not feel better after 1 week.  You have any other questions or concerns. MAKE SURE YOU:   Understand these instructions.  Will watch your condition.  Will get help right away if you are not doing well or get worse.   This information is not intended to replace advice given to you by your health care provider. Make sure you discuss any questions you have with your health care provider.   Document Released: 07/22/2007 Document Revised: 04/27/2011 Document Reviewed: 03/18/2011 Elsevier Interactive Patient Education [employer].

## 2015-08-28 NOTE — Telephone Encounter (Signed)
Requesting tylenol 3 to be filled.

## 2015-08-28 NOTE — ED Provider Notes (Signed)
CSN: [id number]     Arrival date & time 08/28/15  1434 History   First MD Initiated Contact with Patient 08/28/15 1511     Chief Complaint  Patient presents with  . Abdominal Pain   (Consider location/radiation/quality/duration/timing/severity/associated sxs/prior Treatment) HPI Comments: Patient presents today with c/o epigastric abdominal pain and nausea.  Her daughter states the nurse at the nursing home called her and told her to come get her mother and take her to the hospital because she was not feeling well.  She has been having upper abdominal pain for over a week and she has been having to spit up.  She has been nauseated.    Patient is a 82 y.o. female presenting with abdominal pain. The history is provided by the patient.  Abdominal Pain Pain location:  Epigastric Pain quality: aching   Pain radiates to:  Epigastric region Pain severity:  Moderate Onset quality:  Gradual Duration:  8 days Timing:  Constant Progression:  Worsening Chronicity:  New Relieved by:  Nothing Worsened by:  Nothing tried Ineffective treatments:  None tried Associated symptoms: anorexia and nausea   Risk factors: being elderly     Past Medical History  Diagnosis Date  . Diabetes mellitus 2007    HgA1C (02/20/2010) = 9.2, HgA1C (03/20/2009) = 12.1  . CVA (cerebrovascular accident) ([place]) 8676     right embolic stroke, no residual deficits  . Diverticulitis   . CVA (cerebral infarction) 7-yrs ago  . Arthritis   . Dysphagia   . CHF (congestive heart failure) ([place])   . DIABETES MELLITUS, TYPE II 12/04/2005  . HYPERLIPIDEMIA 12/04/2005  . HYPERTENSION 12/04/2005  . GERD 12/04/2005  . ARTHRITIS, KNEE 03/24/2006  . Lung mass 07/08/2010  . Meningioma ([place]) 07/21/2010  . Hemangioma of liver 12/02/2010  . Depression 12/02/2010  . Adenocarcinoma of lung ([place])      Right upper lobe adenocarcinoma. s/p right lower lobectomy 12/15/10  . Brain cancer ([place])     7 cm cerebral right atypical meningoma  grade II   Past Surgical History  Procedure Laterality Date  . Abdominal hysterectomy    . Video bronchoscope.  12/29/2007    [place]  . Wide excision of left upper back mass.    . Extracapsular cataract extraction with intraocular      lens implantation.  . Right vats,right thoracotomy,right lower lobectomy with node dissection    . Incision and drainage perirectal abscess N/A 01/19/2013    Procedure: IRRIGATION AND DEBRIDEMENT PERIRECTAL ABSCESS;  Surgeon: Zenovia Jarred, MD;  Location: [facility];  Service: General;  Laterality: N/A;  . Craniotomy N/A 03/02/2013    Procedure: CRANIOTOMY TUMOR EXCISION;  Surgeon: Winfield Cunas, MD;  Location: [facility];  Service: Neurosurgery;  Laterality: N/A;  Bifrontal Craniotomy for tumor   Family History  Problem Relation Age of Onset  . Hyperlipidemia Brother   . Hypertension Brother   . Diabetes Brother    Social History  Substance Use Topics  . Smoking status: Former Smoker    Types: Cigarettes    Quit date: 02/17/2000  . Smokeless tobacco: Never Used  . Alcohol Use: No   OB History    No data available     Review of Systems  Constitutional: Negative.   HENT: Negative.   Eyes: Negative.   Respiratory: Negative.   Cardiovascular: Negative.   Gastrointestinal: Positive for nausea, abdominal pain and anorexia.  Endocrine: Negative.   Genitourinary: Negative.   Musculoskeletal: Negative.   Skin:  Negative.   Allergic/Immunologic: Negative.   Neurological: Negative.   Hematological: Negative.   Psychiatric/Behavioral: Negative.     Allergies  Review of patient's allergies indicates no known allergies.  Home Medications   Prior to Admission medications   Medication Sig Start Date End Date Taking? Authorizing Provider  acetaminophen-codeine (TYLENOL #3) 300-30 MG tablet Take 1 tablet by mouth 2 (two) times daily as needed for moderate pain. 08/14/15  Yes Carly J Rivet, MD  amLODipine-benazepril (LOTREL) 5-40 MG capsule Take 1  capsule by mouth daily. 07/22/15  Yes Liberty Handy, MD  aspirin EC 81 MG tablet Take 81 mg by mouth daily.   Yes Historical Provider, MD  Blood Glucose Monitoring Suppl (ACCU-CHEK NANO SMARTVIEW) w/Device KIT Please check glucose once in the morning before breakfast and please check before every meal. You can check before bedtime as well. 07/04/15  Yes Liberty Handy, MD  carvedilol (COREG) 25 MG tablet TAKE 1 TABLET (25 MG TOTAL) BY MOUTH 2 (TWO) TIMES DAILY WITH A MEAL. 06/18/15  Yes Liberty Handy, MD  diclofenac sodium (VOLTAREN) 1 % GEL Apply 4 g topically 4 (four) times daily. Patient taking differently: Apply 4 g topically 4 (four) times daily as needed (pain).  07/30/14  Yes Milagros Loll, MD  FERREX 150 150 MG capsule TAKE 1 CAPSULE (150 MG TOTAL) BY MOUTH DAILY. 08/22/15  Yes Nischal Narendra, MD  FLUoxetine (PROZAC) 10 MG capsule TAKE 1 CAPSULE (10 MG TOTAL) BY MOUTH DAILY. 07/22/15  Yes Liberty Handy, MD  glucose blood (ACCU-CHEK SMARTVIEW) test strip Use as instructed 07/04/15  Yes Liberty Handy, MD  Incontinence Supplies [place] For use to address bowel and bladder incontinence. 07/04/15  Yes Liberty Handy, MD  Insulin Pen Needle (CAREFINE PEN NEEDLES) 31G X 8 MM MISC To administer Liraglutide and Lantus 07/04/15  Yes Liberty Handy, MD  Liraglutide 18 MG/3ML SOPN 0.6 mg once daily for 1 week; then increase to 1.2 mg once daily 07/04/15  Yes Liberty Handy, MD  meloxicam (MOBIC) 7.5 MG tablet Take 1 tablet (7.5 mg total) by mouth daily. 08/14/15 08/13/16 Yes Carly J Rivet, MD  omeprazole (PRILOSEC) 20 MG capsule TAKE 1 CAPSULE (20 MG TOTAL) BY MOUTH DAILY. 07/22/15  Yes Liberty Handy, MD  simvastatin (ZOCOR) 20 MG tablet TAKE 1 TABLET BY MOUTH EVERY DAY 07/08/15  Yes Liberty Handy, MD  chlorhexidine (PERIDEX) 0.12 % solution Use as directed 15 mLs in the mouth or throat 2 (two) times daily.    Historical Provider, MD  feeding supplement, GLUCERNA SHAKE, (GLUCERNA SHAKE) LIQD Take 237 mLs by mouth 2 (two) times daily between  meals.    Historical Provider, MD  Insulin Glargine (LANTUS SOLOSTAR) 100 UNIT/ML Solostar Pen Inject 22 Units into the skin at bedtime. 07/04/15   Liberty Handy, MD  levETIRAcetam (KEPPRA) 750 MG tablet TAKE 1 TABLET (750 MG TOTAL) BY MOUTH 2 (TWO) TIMES DAILY. 02/12/15   Liberty Handy, MD   Meds Ordered and Administered this Visit  Medications - No data to display  BP 153/75 mmHg  Pulse 95  Temp(Src) 98.7 F (37.1 C) (Oral)  Resp 16  SpO2 100% No data found.   Physical Exam  Constitutional:  Chronically ill female in NAD  HENT:  Head: Normocephalic.  Right Ear: External ear normal.  Mouth/Throat: Oropharynx is clear and moist.  Eyes: Conjunctivae are normal. Pupils are equal, round, and reactive to light.  Neck: Normal range of motion. Neck supple.  Cardiovascular: Normal rate, regular rhythm and normal  heart sounds.   Pulmonary/Chest: Effort normal and breath sounds normal.  Abdominal: Soft. Bowel sounds are normal. There is tenderness.  Epigastric tenderness.  BS active x 4    ED Course  Procedures (including critical care time)  Labs Review Labs Reviewed - No data to display  Imaging Review No results found.   Visual Acuity Review  Right Eye Distance:   Left Eye Distance:   Bilateral Distance:    Right Eye Near:   Left Eye Near:    Bilateral Near:         MDM  Gastritis Nausea  Zofran 49m in clinic with good results GI cocktail in clinic resolves abdominal pain  Sucralfate 1 gram AC and HS x 1 week #28 zofran 432mone po tid prn #21     WiLysbeth PennerFNP 08/28/15 1603

## 2015-08-30 ENCOUNTER — Ambulatory Visit: Payer: Medicare Other

## 2015-09-10 ENCOUNTER — Encounter: Payer: Medicare Other | Admitting: Internal Medicine

## 2015-09-10 ENCOUNTER — Encounter: Payer: Self-pay | Admitting: Internal Medicine

## 2015-09-17 ENCOUNTER — Ambulatory Visit: Payer: Self-pay

## 2015-09-19 ENCOUNTER — Other Ambulatory Visit: Payer: Self-pay

## 2015-09-19 VITALS — Wt 205.0 lb

## 2015-09-19 DIAGNOSIS — E1169 Type 2 diabetes mellitus with other specified complication: Secondary | ICD-10-CM

## 2015-09-19 DIAGNOSIS — E1110 Type 2 diabetes mellitus with ketoacidosis without coma: Secondary | ICD-10-CM

## 2015-09-19 NOTE — Patient Outreach (Signed)
[facility]) Care Management  [place]  09/19/2015   [name] May 02, 1933 [id number]  Telephone assessment conducted with patient's daughter and Sharin Mons.  She reports patient went to Urgent Care on 7-12 with GI upset.  Daughter states she called PCP, but was told to take patient to Urgent Care as they did not have an open appointment.  Daughter reports cbg readings have not been higher than 130.  She reports patient is cutting back on starches and sweets in her diet.  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  RN will make SW referral for assistance with same.  Encounter Medications:  Outpatient Encounter Prescriptions as of 09/19/2015  Medication Sig Note  . amLODipine-benazepril (LOTREL) 5-40 MG capsule Take 1 capsule by mouth daily.   Marland Kitchen aspirin EC 81 MG tablet Take 81 mg by mouth daily.   . Blood Glucose Monitoring Suppl (ACCU-CHEK NANO SMARTVIEW) w/Device KIT Please check glucose once in the morning before breakfast and please check before every meal. You can check before bedtime as well.   . carvedilol (COREG) 25 MG tablet TAKE 1 TABLET (25 MG TOTAL) BY MOUTH 2 (TWO) TIMES DAILY WITH A MEAL.   . chlorhexidine (PERIDEX) 0.12 % solution Use as directed 15 mLs in the mouth or throat 2 (two) times daily.   . diclofenac sodium (VOLTAREN) 1 % GEL Apply 4 g topically 4 (four) times daily. (Patient taking differently: Apply 4 g topically 4 (four) times daily as needed (pain). )   . feeding supplement, GLUCERNA SHAKE, (GLUCERNA SHAKE) LIQD Take 237 mLs by mouth 2 (two) times daily between meals.   Marland Kitchen FERREX 150 150 MG capsule TAKE 1 CAPSULE (150 MG TOTAL) BY MOUTH DAILY.   Marland Kitchen FLUoxetine (PROZAC) 10 MG capsule TAKE 1 CAPSULE (10 MG TOTAL) BY MOUTH DAILY.   Marland Kitchen glucose blood (ACCU-CHEK SMARTVIEW) test strip Use as instructed   . Insulin Glargine (LANTUS SOLOSTAR) 100 UNIT/ML Solostar Pen Inject 22 Units into the skin at  bedtime. 07/18/2015: New dosage started 07-04-15.  . Insulin Pen Needle (CAREFINE PEN NEEDLES) 31G X 8 MM MISC To administer Liraglutide and Lantus   . levETIRAcetam (KEPPRA) 750 MG tablet TAKE 1 TABLET (750 MG TOTAL) BY MOUTH 2 (TWO) TIMES DAILY.   Marland Kitchen Liraglutide 18 MG/3ML SOPN 0.6 mg once daily for 1 week; then increase to 1.2 mg once daily   . meloxicam (MOBIC) 7.5 MG tablet Take 1 tablet (7.5 mg total) by mouth daily.   Marland Kitchen omeprazole (PRILOSEC) 20 MG capsule TAKE 1 CAPSULE (20 MG TOTAL) BY MOUTH DAILY.   . simvastatin (ZOCOR) 20 MG tablet TAKE 1 TABLET BY MOUTH EVERY DAY   . sucralfate (CARAFATE) 1 g tablet Take 1 tablet (1 g total) by mouth 4 (four) times daily -  with meals and at bedtime.   Marland Kitchen acetaminophen-codeine (TYLENOL #3) 300-30 MG tablet Take 1 tablet by mouth 2 (two) times daily as needed for moderate pain.   . Incontinence Supplies MISC For use to address bowel and bladder incontinence.   . ondansetron (ZOFRAN ODT) 4 MG disintegrating tablet Take 1 tablet (4 mg total) by mouth every 8 (eight) hours as needed for nausea or vomiting. (Patient not taking: Reported on 09/19/2015)    No facility-administered encounter medications on file as of 09/19/2015.     Functional Status:  In your present state of health, do you have any difficulty performing the following activities: 08/14/2015 07/18/2015  Hearing? [place]? Y -  Difficulty concentrating or making decisions? N -  Walking or climbing stairs? Y -  Dressing or bathing? Y -  Doing errands, shopping? Y -  Conservation officer, nature and eating ? - Y  Using the Toilet? - Y  In the past six months, have you accidently leaked urine? - Y  Do you have problems with loss of bowel control? - N  Managing your Medications? - Y  Managing your Finances? - Y  Housekeeping or managing your Housekeeping? - Y  Some recent data might be hidden    Fall/Depression Screening: PHQ 2/9 Scores 08/14/2015 07/18/2015 07/04/2015 05/02/2015 03/01/2015 10/30/2014 09/03/2014   PHQ - 2 Score 0 0 0 0 0 0 0      Plan: Daughter will schedule appointment with PCP during the month of August.           Daughter will follow up with PCP concerning a referral for vision check.           Patient will improve healthy food choices based on diabetic dietary guidelines.           RN will complete referral for SW to assist with CAPS.           RN will follow up for monthly contact in September.

## 2015-09-24 ENCOUNTER — Other Ambulatory Visit: Payer: Self-pay | Admitting: Licensed Clinical Social Worker

## 2015-09-24 NOTE — Patient Outreach (Signed)
[facility]) Care Management  09/24/2015  SINCLAIR ARRAZOLA 06/13/1933 [id number]   Assessment- CSW received referral from [facility] telephonic. Patient's daughter is interested in information on CAPS program so that she can be paid to be the patient's caregiver and be put on the wait list. CSW completed outreach call. HIPPA verifications received from daughter. Daughter reports "I just need the number to call." CSW educated her on the CAPS program, DSS caseworker contact name Bunnie Philips and her number and how to be put on the wait list which is a one year wait list at this time. Patient's daughter was informed that she does not need to have certification or experience to qualify for this program. Patient's daughter appreciative of information. CSW will mail out this resource information to her residence within one week. Daughter denied any further social work needs and therefore case will be closed.  Plan-CSW will update [facility] RNCM. CSW will not open case at this time as resource information was provided successfully.  [name], BSW, MSW, [email or web address] Phone: [phone] Fax: [phone]

## 2015-10-24 ENCOUNTER — Ambulatory Visit: Payer: Self-pay

## 2015-10-29 ENCOUNTER — Other Ambulatory Visit: Payer: Self-pay

## 2015-10-29 NOTE — Patient Outreach (Signed)
[facility]) Care Management  10/29/2015  [name] [birth year] [id number]   Unsuccessful attempt to reach patient by phone.  HIPAA appropriate message left requesting call back.  If no response RN will make another attempt within 10 working days.   [name], RN, MSN [place] [phone] Fax [phone]

## 2015-11-05 ENCOUNTER — Other Ambulatory Visit: Payer: Self-pay

## 2015-11-05 DIAGNOSIS — E1169 Type 2 diabetes mellitus with other specified complication: Secondary | ICD-10-CM

## 2015-11-05 NOTE — Patient Outreach (Signed)
[facility]) Care Management  11/05/2015  CAYLE CORDOBA Mar 01, 1933 [id number]  Telephone contact with daughter [name].  She reports patient is doing well- that her cbgs have not been over 134, and that she has improved her adherence to diabetic dietary guidelines.  She has an appointment with her PCP on 12-03-15.  Plan:  Patient will keep medical appointment.           Patient will get her annual flu vaccination.           Patient will continue monitoring cbgs and adhering to diabetic dietary guidelines.           RN will follow up in October.  [name], RN, MSN [place] [phone] Fax [phone]

## 2015-12-02 ENCOUNTER — Telehealth: Payer: Self-pay | Admitting: Internal Medicine

## 2015-12-02 NOTE — Progress Notes (Signed)
   CC: medication refills   HPI: [name] is a 82 y.o. with past medical history as outlined below who presents to clinic for follow up of diabetes mellitus, hypertension, and arthritis.   Please see problem list for status of the pt's chronic medical problems.  Past Medical History:  Diagnosis Date  . Adenocarcinoma of lung ([place])     Right upper lobe adenocarcinoma. s/p right lower lobectomy 12/15/10  . Arthritis   . ARTHRITIS, KNEE 03/24/2006  . Brain cancer ([place])    7 cm cerebral right atypical meningoma grade II  . CHF (congestive heart failure) ([place])   . CVA (cerebral infarction) 7-yrs ago  . CVA (cerebrovascular accident) ([place]) 4628    right embolic stroke, no residual deficits  . Depression 12/02/2010  . Diabetes mellitus 2007   HgA1C (02/20/2010) = 9.2, HgA1C (03/20/2009) = 12.1  . DIABETES MELLITUS, TYPE II 12/04/2005  . Diverticulitis   . Dysphagia   . GERD 12/04/2005  . Hemangioma of liver 12/02/2010  . HYPERLIPIDEMIA 12/04/2005  . HYPERTENSION 12/04/2005  . Lung mass 07/08/2010  . Meningioma ([place]) 07/21/2010    Review of Systems:  Review of Systems  Constitutional: Negative for fever and weight loss.  HENT: Negative for congestion.   Respiratory: Positive for cough. Negative for shortness of breath.   Cardiovascular: Negative for chest pain and palpitations.  Gastrointestinal: Negative for abdominal pain, constipation and diarrhea.  Genitourinary: Negative for dysuria.  Musculoskeletal: Positive for joint pain.  Neurological: Positive for tingling. Negative for headaches.    Physical Exam:  Wt 205.1, HR 68, BP 132/80, T 98, SpO2 99%  There were no vitals filed for this visit. Physical Exam  Constitutional: She appears well-developed and well-nourished. No distress.  HENT:  Head: Normocephalic and atraumatic.  Mouth/Throat: No oropharyngeal exudate.  Eyes:  Left eye dilatedsecondary to past trauma  Neck: Normal range of motion. No thyromegaly  present.  Cardiovascular: Normal rate and regular rhythm.   No murmur heard. Pulmonary/Chest: Effort normal. No stridor. She has no wheezes. She has no rales.  Abdominal: Soft. Bowel sounds are normal. She exhibits no distension. There is no tenderness.  Musculoskeletal: She exhibits no edema or tenderness.  Lymphadenopathy:    She has no cervical adenopathy.  Psychiatric: She has a normal mood and affect. Her behavior is normal.    Assessment & Plan:   See Encounters Tab for problem based charting.   Patient seen with Dr. Angelia Mould

## 2015-12-02 NOTE — Assessment & Plan Note (Addendum)
A: Initial blood pressure check was 169/73 on recheck she was 132/80. Her daughter states that she doesn't think missed meal takes amlodipine-benzepril however after review of her chart it appears that she pick this prescription up in August. It is unclear whether she is taking this medication but her blood pressure seems to be under control.   P:  Refilled carvedilol 25 mg BID  Refilled Amlodipine-benzepril 5-40 mg daily

## 2015-12-02 NOTE — Telephone Encounter (Signed)
APT. REMINDER CALL, LMTCB °

## 2015-12-02 NOTE — Assessment & Plan Note (Addendum)
A: Her home medication is Lantus 22 units at bedtime. At her office visit in May 2017. Liraglutide was added however she experienced diarrhea and GI upset with this medication and stopped taking it. She has been on metformin in the past but had GI upset with this medication. It was explained that metformin extended release has decreased GI side effects [name] and her daughter agreed to try this medication. She denies any episodes of hypoglycemia. She checks her fingerstick glucose at from time to time at home states that is usually in the 200s. She does say that she has had some new tingling and burning in her feet which is worse at night. HbA1c today 11.2  Blood Pressure 132/80 today with goal <140/90  LDL 09/2013 101 with goal <100  Opthalmology exam 04/2013 hypertensive retinopathy  Urine Microalbumin/creatinine ratio 07/2008 14.2  Foot exam today no skin abrasions  P: Prescribed Metformin 500 mg extended-release daily Refilled Lantus with increased to 27 units at bedtime Discontinue  liraglutide  Repeated HbA1C today Referral to an opthamologist for eye exam given her history of hypertensive retinopathy Urine sample for Microalbumin/creatinine today   monofilament foot test next visit  Microalbumin/creatinine urine test next visit

## 2015-12-03 ENCOUNTER — Ambulatory Visit (INDEPENDENT_AMBULATORY_CARE_PROVIDER_SITE_OTHER): Payer: Medicare Other | Admitting: Internal Medicine

## 2015-12-03 ENCOUNTER — Encounter: Payer: Self-pay | Admitting: Internal Medicine

## 2015-12-03 VITALS — BP 169/73 | HR 68 | Temp 98.0°F | Wt 205.1 lb

## 2015-12-03 DIAGNOSIS — H35039 Hypertensive retinopathy, unspecified eye: Secondary | ICD-10-CM

## 2015-12-03 DIAGNOSIS — F32A Depression, unspecified: Secondary | ICD-10-CM

## 2015-12-03 DIAGNOSIS — Z87891 Personal history of nicotine dependence: Secondary | ICD-10-CM

## 2015-12-03 DIAGNOSIS — Z789 Other specified health status: Secondary | ICD-10-CM

## 2015-12-03 DIAGNOSIS — Z760 Encounter for issue of repeat prescription: Secondary | ICD-10-CM

## 2015-12-03 DIAGNOSIS — I10 Essential (primary) hypertension: Secondary | ICD-10-CM | POA: Diagnosis not present

## 2015-12-03 DIAGNOSIS — E1140 Type 2 diabetes mellitus with diabetic neuropathy, unspecified: Secondary | ICD-10-CM

## 2015-12-03 DIAGNOSIS — G40909 Epilepsy, unspecified, not intractable, without status epilepticus: Secondary | ICD-10-CM

## 2015-12-03 DIAGNOSIS — Z794 Long term (current) use of insulin: Secondary | ICD-10-CM | POA: Diagnosis not present

## 2015-12-03 DIAGNOSIS — E1165 Type 2 diabetes mellitus with hyperglycemia: Secondary | ICD-10-CM | POA: Diagnosis not present

## 2015-12-03 DIAGNOSIS — Z79899 Other long term (current) drug therapy: Secondary | ICD-10-CM

## 2015-12-03 DIAGNOSIS — M25561 Pain in right knee: Secondary | ICD-10-CM

## 2015-12-03 DIAGNOSIS — IMO0002 Reserved for concepts with insufficient information to code with codable children: Secondary | ICD-10-CM

## 2015-12-03 DIAGNOSIS — G8929 Other chronic pain: Secondary | ICD-10-CM

## 2015-12-03 DIAGNOSIS — F329 Major depressive disorder, single episode, unspecified: Secondary | ICD-10-CM

## 2015-12-03 DIAGNOSIS — I5032 Chronic diastolic (congestive) heart failure: Secondary | ICD-10-CM

## 2015-12-03 LAB — GLUCOSE, CAPILLARY: Glucose-Capillary: 364 mg/dL — ABNORMAL HIGH (ref 65–99)

## 2015-12-03 LAB — POCT GLYCOSYLATED HEMOGLOBIN (HGB A1C): HEMOGLOBIN A1C: 11.2

## 2015-12-03 MED ORDER — INSULIN GLARGINE 100 UNIT/ML SOLOSTAR PEN: PEN_INJECTOR | SUBCUTANEOUS | 2 refills | Status: DC

## 2015-12-03 MED ORDER — FLUOXETINE HCL 10 MG PO CAPS: ORAL_CAPSULE | ORAL | 0 refills | Status: DC

## 2015-12-03 MED ORDER — CARVEDILOL 25 MG PO TABS: 25.0000 mg | ORAL_TABLET | Freq: Two times a day (BID) | ORAL | 3 refills | Status: DC

## 2015-12-03 MED ORDER — METFORMIN HCL ER (MOD) 500 MG PO TB24: 500.0000 mg | ORAL_TABLET | Freq: Every day | ORAL | 0 refills | Status: DC

## 2015-12-03 MED ORDER — AMLODIPINE BESY-BENAZEPRIL HCL 5-40 MG PO CAPS: 1.0000 | ORAL_CAPSULE | Freq: Every day | ORAL | 2 refills | Status: DC

## 2015-12-03 MED ORDER — MELOXICAM 7.5 MG PO TABS: 7.5000 mg | ORAL_TABLET | Freq: Every day | ORAL | 0 refills | Status: DC

## 2015-12-03 MED ORDER — ACETAMINOPHEN-CODEINE #3 300-30 MG PO TABS: 1.0000 | ORAL_TABLET | Freq: Two times a day (BID) | ORAL | 1 refills | Status: DC | PRN

## 2015-12-03 NOTE — Assessment & Plan Note (Signed)
A: She states that her chronic pain has been well-controlled with meloxicam and Tylenol. Her granddaughter states that she only gives [name] these prescriptions when she is in pain and needs them  P: Refilled meloxicam 7.5 mg daily Refill Tylenol # 3 300-30 mg twice daily as needed

## 2015-12-03 NOTE — Patient Instructions (Signed)
[name],   It was so nice to meet you today!   Please start taking metformin once daily  Please start taking 27 units of insulin at bedtime   Please come back to see [place] in a month and bring all of your medication bottles with you so we can go through them. Please also bring any glucose recordings that you have.   We will be able to discuss more about your home health care then as well!   Thank you!

## 2015-12-03 NOTE — Assessment & Plan Note (Signed)
A: She has not had any seizures recently.  P: Continue Keppra 750 mg twice daily

## 2015-12-03 NOTE — Assessment & Plan Note (Signed)
A: these symptoms have been controlled   P: refilled fluoxetine 10 mg daily

## 2015-12-04 ENCOUNTER — Telehealth: Payer: Self-pay

## 2015-12-04 NOTE — Telephone Encounter (Signed)
Pt daughter states insurance will not covered metformin. Please call pt daughter back.

## 2015-12-04 NOTE — Telephone Encounter (Signed)
Pharmacy was called and confirmed that insurance will not pay for glumetza, will need to be regular metformin, can we send a new script for this med?

## 2015-12-04 NOTE — Telephone Encounter (Signed)
Tried to call daughter, her mailbox is full

## 2015-12-05 ENCOUNTER — Other Ambulatory Visit: Payer: Self-pay

## 2015-12-05 VITALS — Ht 64.0 in | Wt 205.0 lb

## 2015-12-05 DIAGNOSIS — E1110 Type 2 diabetes mellitus with ketoacidosis without coma: Secondary | ICD-10-CM

## 2015-12-05 NOTE — Patient Outreach (Signed)
[facility]) Care Management  12/05/2015  [name] 10-27-1933 [id number]   Telephone contact conducted with daughter [name] at patient's request.  Patient was seen on 12-03-15 at PCP office.  Her A1C has increased to 11.2.  Medication changes were made.  Plan:  Daughter will focus on diabetic dietary guidelines/carb limitations.           Daughter will follow up with PCP re order for Metformin and eye referral.           RN will follow up in November.  [name], RN, MSN [place] [phone] Fax [phone]

## 2015-12-05 NOTE — Telephone Encounter (Signed)
She had GI symptoms related to regular metformin in the past, that is why we had wanted to try the extended release version. If she is okay with trying the regular metformin again I'm ok with going with trying the regular.

## 2015-12-05 NOTE — Telephone Encounter (Signed)
Called back today, lm for rtc

## 2015-12-09 NOTE — Progress Notes (Signed)
Internal Medicine Clinic Attending  I saw and evaluated the patient.  I personally confirmed the key portions of the history and exam documented by Dr. Blum and I reviewed pertinent patient test results.  The assessment, diagnosis, and plan were formulated together and I agree with the documentation in the resident's note. 

## 2015-12-19 ENCOUNTER — Telehealth: Payer: Self-pay

## 2015-12-19 DIAGNOSIS — Z794 Long term (current) use of insulin: Principal | ICD-10-CM

## 2015-12-19 DIAGNOSIS — IMO0002 Reserved for concepts with insufficient information to code with codable children: Secondary | ICD-10-CM

## 2015-12-19 DIAGNOSIS — E1165 Type 2 diabetes mellitus with hyperglycemia: Principal | ICD-10-CM

## 2015-12-19 DIAGNOSIS — E1140 Type 2 diabetes mellitus with diabetic neuropathy, unspecified: Secondary | ICD-10-CM

## 2015-12-19 MED ORDER — METFORMIN HCL ER 500 MG PO TB24: 500.0000 mg | ORAL_TABLET | Freq: Every day | ORAL | 0 refills | Status: DC

## 2015-12-19 NOTE — Addendum Note (Signed)
Addended by: Meryl Dare on: 12/19/2015 11:42 AM   Modules accepted: Orders

## 2015-12-19 NOTE — Telephone Encounter (Addendum)
[name] is a 82 y.o. female who was contacted on behalf of [facility] Task Force.   Diabetes Assessment  DM meds and BS checks -  "What medications are you taking for diabetes?" Patient appears to be non-adherent to diabetes medications. Patient's daughter reports the patient has missed doses because her brother is in hospice with poor prognosis. Daughter reports that the patient is likely to continue being non-adherent to diabetes medications out of worry for her brother. Patient has also not been taking Metformin due to cost. Prescription was written for [facility], which was not covered by insurance and co-pay was > $1,000. Switched patient to Glucophage which is covered.  -  "How often are you checking your blood sugars at home?"  Inconsistent, rare - "What have your blood sugars been?" Unknown  High Blood Pressure Assessment  BP meds & BP checks -  "What medications are you taking for high blood pressure?" Carvedilol 25 mg, Amlodipine-benazepril 5-40 mg capsule   - "How often are you checking your blood pressure at home?" Not regularly    - "What have your blood pressure readings been?" Not monitoring regularly at home   Coping with DM and BP - What else are you doing to help yourself with your DM and BP - Diet? N/a Exercise? N/a  Medication Access Issues  Medication Issues? -  "Are you getting your medicines refilled on time without skipping any doses?" Patient is regularly getting all medications except for Metformin.  - "Are you having any problems getting/taking your medicines? - cost, timing, transportation?" Cost concerns with Glumetza now resolved  Conclusion  Close the call - "Do you have your next appointment scheduled? - give pt date and time if needed (make appt for them if needed before calling) 01/15/2016  -   Patient was contacted with [name], PharmD candidate. I agree with the assessment and plan of care documented.

## 2015-12-20 NOTE — Addendum Note (Signed)
Addended by: Meryl Dare on: 12/20/2015 03:18 PM   Modules accepted: Orders

## 2016-01-06 LAB — HM DIABETES EYE EXAM

## 2016-01-07 ENCOUNTER — Encounter: Payer: Self-pay | Admitting: *Deleted

## 2016-01-14 ENCOUNTER — Other Ambulatory Visit: Payer: Self-pay | Admitting: Internal Medicine

## 2016-01-14 ENCOUNTER — Other Ambulatory Visit: Payer: Self-pay | Admitting: *Deleted

## 2016-01-14 DIAGNOSIS — Z794 Long term (current) use of insulin: Secondary | ICD-10-CM

## 2016-01-14 DIAGNOSIS — E1165 Type 2 diabetes mellitus with hyperglycemia: Secondary | ICD-10-CM

## 2016-01-14 DIAGNOSIS — E1140 Type 2 diabetes mellitus with diabetic neuropathy, unspecified: Secondary | ICD-10-CM

## 2016-01-14 DIAGNOSIS — I10 Essential (primary) hypertension: Secondary | ICD-10-CM

## 2016-01-14 DIAGNOSIS — M25561 Pain in right knee: Principal | ICD-10-CM

## 2016-01-14 DIAGNOSIS — G8929 Other chronic pain: Secondary | ICD-10-CM

## 2016-01-14 DIAGNOSIS — IMO0002 Reserved for concepts with insufficient information to code with codable children: Secondary | ICD-10-CM

## 2016-01-14 NOTE — Telephone Encounter (Signed)
Has appt tomorrow with pcp.[name] Hidden Cassady11/28/20172:16 PM

## 2016-01-15 ENCOUNTER — Other Ambulatory Visit: Payer: Self-pay | Admitting: Internal Medicine

## 2016-01-15 ENCOUNTER — Ambulatory Visit (INDEPENDENT_AMBULATORY_CARE_PROVIDER_SITE_OTHER): Payer: Medicare Other | Admitting: Internal Medicine

## 2016-01-15 ENCOUNTER — Encounter: Payer: Self-pay | Admitting: Internal Medicine

## 2016-01-15 DIAGNOSIS — Z87891 Personal history of nicotine dependence: Secondary | ICD-10-CM

## 2016-01-15 DIAGNOSIS — E1165 Type 2 diabetes mellitus with hyperglycemia: Secondary | ICD-10-CM | POA: Diagnosis not present

## 2016-01-15 DIAGNOSIS — F3342 Major depressive disorder, recurrent, in full remission: Secondary | ICD-10-CM

## 2016-01-15 DIAGNOSIS — E1140 Type 2 diabetes mellitus with diabetic neuropathy, unspecified: Secondary | ICD-10-CM | POA: Diagnosis not present

## 2016-01-15 DIAGNOSIS — Z79891 Long term (current) use of opiate analgesic: Secondary | ICD-10-CM

## 2016-01-15 DIAGNOSIS — I10 Essential (primary) hypertension: Secondary | ICD-10-CM

## 2016-01-15 DIAGNOSIS — M25561 Pain in right knee: Secondary | ICD-10-CM

## 2016-01-15 DIAGNOSIS — Z794 Long term (current) use of insulin: Secondary | ICD-10-CM

## 2016-01-15 DIAGNOSIS — M1990 Unspecified osteoarthritis, unspecified site: Secondary | ICD-10-CM

## 2016-01-15 DIAGNOSIS — IMO0002 Reserved for concepts with insufficient information to code with codable children: Secondary | ICD-10-CM

## 2016-01-15 DIAGNOSIS — G40909 Epilepsy, unspecified, not intractable, without status epilepticus: Secondary | ICD-10-CM

## 2016-01-15 DIAGNOSIS — E7800 Pure hypercholesterolemia, unspecified: Secondary | ICD-10-CM

## 2016-01-15 DIAGNOSIS — G8929 Other chronic pain: Secondary | ICD-10-CM

## 2016-01-15 DIAGNOSIS — F320 Major depressive disorder, single episode, mild: Secondary | ICD-10-CM

## 2016-01-15 DIAGNOSIS — K219 Gastro-esophageal reflux disease without esophagitis: Secondary | ICD-10-CM

## 2016-01-15 DIAGNOSIS — Z79899 Other long term (current) drug therapy: Secondary | ICD-10-CM

## 2016-01-15 MED ORDER — CARVEDILOL 25 MG PO TABS: 25.0000 mg | ORAL_TABLET | Freq: Two times a day (BID) | ORAL | 1 refills | Status: DC

## 2016-01-15 MED ORDER — ACETAMINOPHEN-CODEINE #3 300-30 MG PO TABS: 1.0000 | ORAL_TABLET | Freq: Every evening | ORAL | 3 refills | Status: DC | PRN

## 2016-01-15 MED ORDER — FLUOXETINE HCL 10 MG PO CAPS: ORAL_CAPSULE | ORAL | 0 refills | Status: DC

## 2016-01-15 MED ORDER — INSULIN GLARGINE 100 UNIT/ML SOLOSTAR PEN: PEN_INJECTOR | SUBCUTANEOUS | 6 refills | Status: DC

## 2016-01-15 MED ORDER — METFORMIN HCL ER 500 MG PO TB24: 500.0000 mg | ORAL_TABLET | Freq: Two times a day (BID) | ORAL | 1 refills | Status: DC

## 2016-01-15 MED ORDER — MELOXICAM 7.5 MG PO TABS: 7.5000 mg | ORAL_TABLET | Freq: Every day | ORAL | 1 refills | Status: DC

## 2016-01-15 MED ORDER — AMLODIPINE BESY-BENAZEPRIL HCL 5-40 MG PO CAPS: 1.0000 | ORAL_CAPSULE | Freq: Every day | ORAL | 1 refills | Status: DC

## 2016-01-15 MED ORDER — POLYSACCHARIDE IRON COMPLEX 150 MG PO CAPS: ORAL_CAPSULE | ORAL | 3 refills | Status: DC

## 2016-01-15 NOTE — Patient Instructions (Addendum)
It was a pleasure to see you today [name]   For your diabetes - start taking metformin 500 mg twice daily ( you were previously taking it once daily )  Start taking lantus 30 units at bedtime ( you were previously taking 27 units)   Schedule a follow up appointment to see Dr. Hetty Ely in 3 months   Diabetes Mellitus and Food It is important for you to manage your blood sugar (glucose) level. Your blood glucose level can be greatly affected by what you eat. Eating healthier foods in the appropriate amounts throughout the day at about the same time each day will help you control your blood glucose level. It can also help slow or prevent worsening of your diabetes mellitus. Healthy eating may even help you improve the level of your blood pressure and reach or maintain a healthy weight. General recommendations for healthful eating and cooking habits include:  Eating meals and snacks regularly. Avoid going long periods of time without eating to lose weight.  Eating a diet that consists mainly of plant-based foods, such as fruits, vegetables, nuts, legumes, and whole grains.  Using low-heat cooking methods, such as baking, instead of high-heat cooking methods, such as deep frying. Work with your dietitian to make sure you understand how to use the Nutrition Facts information on food labels. How can food affect me? Carbohydrates  Carbohydrates affect your blood glucose level more than any other type of food. Your dietitian will help you determine how many carbohydrates to eat at each meal and teach you how to count carbohydrates. Counting carbohydrates is important to keep your blood glucose at a healthy level, especially if you are using insulin or taking certain medicines for diabetes mellitus. Alcohol  Alcohol can cause sudden decreases in blood glucose (hypoglycemia), especially if you use insulin or take certain medicines for diabetes mellitus. Hypoglycemia can be a life-threatening condition.  Symptoms of hypoglycemia (sleepiness, dizziness, and disorientation) are similar to symptoms of having too much alcohol. If your health care provider has given you approval to drink alcohol, do so in moderation and use the following guidelines:  Women should not have more than one drink per day, and men should not have more than two drinks per day. One drink is equal to:  12 oz of beer.  5 oz of wine.  1 oz of hard liquor.  Do not drink on an empty stomach.  Keep yourself hydrated. Have water, diet soda, or unsweetened iced tea.  Regular soda, juice, and other mixers might contain a lot of carbohydrates and should be counted. What foods are not recommended? As you make food choices, it is important to remember that all foods are not the same. Some foods have fewer nutrients per serving than other foods, even though they might have the same number of calories or carbohydrates. It is difficult to get your body what it needs when you eat foods with fewer nutrients. Examples of foods that you should avoid that are high in calories and carbohydrates but low in nutrients include:  Trans fats (most processed foods list trans fats on the Nutrition Facts label).  Regular soda.  Juice.  Candy.  Sweets, such as cake, pie, doughnuts, and cookies.  Fried foods. What foods can I eat? Eat nutrient-rich foods, which will nourish your body and keep you healthy. The food you should eat also will depend on several factors, including:  The calories you need.  The medicines you take.  Your weight.  Your  blood glucose level.  Your blood pressure level.  Your cholesterol level. You should eat a variety of foods, including:  Protein.  Lean cuts of meat.  Proteins low in saturated fats, such as fish, egg whites, and beans. Avoid processed meats.  Fruits and vegetables.  Fruits and vegetables that may help control blood glucose levels, such as apples, mangoes, and yams.  Dairy  products.  Choose fat-free or low-fat dairy products, such as milk, yogurt, and cheese.  Grains, bread, pasta, and rice.  Choose whole grain products, such as multigrain bread, whole oats, and brown rice. These foods may help control blood pressure.  Fats.  Foods containing healthful fats, such as nuts, avocado, olive oil, canola oil, and fish. Does everyone with diabetes mellitus have the same meal plan? Because every person with diabetes mellitus is different, there is not one meal plan that works for everyone. It is very important that you meet with a dietitian who will help you create a meal plan that is just right for you. This information is not intended to replace advice given to you by your health care provider. Make sure you discuss any questions you have with your health care provider. Document Released: 10/30/2004 Document Revised: 07/11/2015 Document Reviewed: 12/30/2012 Elsevier Interactive Patient Education  2017 [employer].

## 2016-01-15 NOTE — Progress Notes (Signed)
   CC: medication management for Diabetes   HPI: [name] is a 82 y.o. with past medical history as outlined below who presents to clinic for follow up of medication management of hypertension, uncontrolled diabetes, knee pain, seizure, depression.   Please see problem list for status of the pt's chronic medical problems.  Past Medical History:  Diagnosis Date  . Adenocarcinoma of lung ([place])     Right upper lobe adenocarcinoma. s/p right lower lobectomy 12/15/10  . Arthritis   . ARTHRITIS, KNEE 03/24/2006  . Brain cancer ([place])    7 cm cerebral right atypical meningoma grade II  . CHF (congestive heart failure) ([place])   . CVA (cerebral infarction) 7-yrs ago  . CVA (cerebrovascular accident) ([place]) 9675    right embolic stroke, no residual deficits  . Depression 12/02/2010  . Diabetes mellitus 2007   HgA1C (02/20/2010) = 9.2, HgA1C (03/20/2009) = 12.1  . DIABETES MELLITUS, TYPE II 12/04/2005  . Diverticulitis   . Dysphagia   . GERD 12/04/2005  . Hemangioma of liver 12/02/2010  . HYPERLIPIDEMIA 12/04/2005  . HYPERTENSION 12/04/2005  . Lung mass 07/08/2010  . Meningioma ([place]) 07/21/2010   Review of Systems:  Please see each problem below for a pertinent review of systems. ROS  Physical Exam:  Vitals:   01/15/16 1606  BP: (!) 145/67  Pulse: 79  Temp: 98 F (36.7 C)  TempSrc: Oral  SpO2: 99%  Weight: 208 lb (94.3 kg)  Height: '5\' 4"'$  (1.626 m)   Physical Exam  Constitutional: She appears well-developed and well-nourished. No distress.  Eyes: Conjunctivae are normal. No scleral icterus.  Cardiovascular: Normal rate and regular rhythm.   No murmur heard. Pulmonary/Chest: Effort normal. She has no wheezes. She has no rales.  Abdominal: Soft. Bowel sounds are normal. She exhibits no distension. There is no tenderness.  Neurological: She is alert.  Skin: Skin is warm and dry. She is not diaphoretic. No pallor.  Feet have dry skin but no lacerations    Assessment &  Plan:   See Encounters Tab for problem based charting.  Patient seen with Dr. Angelia Mould

## 2016-01-16 ENCOUNTER — Other Ambulatory Visit: Payer: Self-pay

## 2016-01-16 DIAGNOSIS — E1169 Type 2 diabetes mellitus with other specified complication: Secondary | ICD-10-CM

## 2016-01-16 DIAGNOSIS — Z794 Long term (current) use of insulin: Principal | ICD-10-CM

## 2016-01-16 NOTE — Patient Outreach (Signed)
[facility] [facility]) Care Management  01/16/2016  [name] August 20, 1933 [id number]   Telephone contact today.  Call conducted with daughter/HCPOA [name], but patient was listening nearby. Patient saw PCP yesterday, and her insulin and metformin were increased.  Reviewed med changes and dietary guidelines with daughter.   Plan:  Patient will continue self-management of diabetes with daily cbgs and diabetic diet.            RN will follow up in December.  [name], RN, MSN [place] [phone] Fax [phone]

## 2016-01-17 NOTE — Telephone Encounter (Signed)
meds refilled during visit, phone call complete.  No further action needed.Regenia Skeeter, Darlene Cassady12/1/20174:04 PM

## 2016-01-18 MED ORDER — METFORMIN HCL ER 500 MG PO TB24: 500.0000 mg | ORAL_TABLET | Freq: Two times a day (BID) | ORAL | 3 refills | Status: DC

## 2016-01-18 MED ORDER — SIMVASTATIN 20 MG PO TABS: 20.0000 mg | ORAL_TABLET | Freq: Every day | ORAL | 3 refills | Status: DC

## 2016-01-18 MED ORDER — GLUCOSE BLOOD VI STRP: ORAL_STRIP | 12 refills | Status: DC

## 2016-01-18 MED ORDER — DICLOFENAC SODIUM 1 % TD GEL: 4.0000 g | Freq: Four times a day (QID) | TRANSDERMAL | 6 refills | Status: DC | PRN

## 2016-01-18 MED ORDER — FLUOXETINE HCL 10 MG PO CAPS: ORAL_CAPSULE | ORAL | 3 refills | Status: DC

## 2016-01-18 MED ORDER — LEVETIRACETAM 750 MG PO TABS: ORAL_TABLET | ORAL | 3 refills | Status: DC

## 2016-01-18 MED ORDER — INSULIN GLARGINE 100 UNIT/ML SOLOSTAR PEN: PEN_INJECTOR | SUBCUTANEOUS | 4 refills | Status: DC

## 2016-01-18 MED ORDER — AMLODIPINE BESY-BENAZEPRIL HCL 5-40 MG PO CAPS: 1.0000 | ORAL_CAPSULE | Freq: Every day | ORAL | 3 refills | Status: DC

## 2016-01-18 MED ORDER — CARVEDILOL 25 MG PO TABS: 25.0000 mg | ORAL_TABLET | Freq: Two times a day (BID) | ORAL | 3 refills | Status: DC

## 2016-01-18 MED ORDER — MELOXICAM 7.5 MG PO TABS: 7.5000 mg | ORAL_TABLET | Freq: Every day | ORAL | 3 refills | Status: AC

## 2016-01-18 MED ORDER — OMEPRAZOLE 20 MG PO CPDR: DELAYED_RELEASE_CAPSULE | ORAL | 3 refills | Status: DC

## 2016-01-18 NOTE — Assessment & Plan Note (Signed)
PHQ -9 = 5 which correlates with mild depression. She feels her symptoms are well controlled with prozac.   Refilled fluoxetine 10 mg daily

## 2016-01-18 NOTE — Assessment & Plan Note (Addendum)
Denies episodes of hypoglycemia. Did not bring her meter to clinic today. Reports sugars are >200 but <300. Feels she may benefit from reinforcement of healthy eating recommendations with Butch Penny.  Lab Results  Component Value Date   HGBA1C 11.2 12/03/2015  Urine Microalbumin/creatinine ratio 2010 - 14.2 Foot exam today- state she has some tingling in her feet but denies numbness or burning. No skin lacerations on exam today.  Opthalmology exam 08/2015 mild NPDR right eye  Increased to metformin 500 mg BID   Increased to lantus 30 U qHS  Continue simvastatin 20 mg daily Standing order for follow up HbA1c in January  Standing order for follow up urine microalbumin/creatinie ratio  Referral for follow up to see nutritionist   Addendum: Patient did not return for proteinuria screening, will need to address at return visit.

## 2016-01-18 NOTE — Assessment & Plan Note (Signed)
Chronic pain has been well controlled with meloxicam and tylenol with codeine. She takes the meloxicam in the morning and tylenol in the evening. Only takes about 1 pill per night but sometimes takes an additional pill about 10 days out of the month. She denies daytime somnolence or constipation.   Refilled meloxicam 7.5 mg daily  Refilled 4 monthsTylenol #3 twice daily as needed #40 (decreased from #60)  Refilled voltaren gel

## 2016-01-18 NOTE — Assessment & Plan Note (Deleted)
BP Readings from Last 3 Encounters:  01/15/16 (!) 145/67  12/03/15 (!) 169/73  08/28/15 153/75   Refilled Carvedilol 25 mg twice a day Refilled Amlodipine/benazepril 5-40 milligrams daily

## 2016-01-18 NOTE — Assessment & Plan Note (Addendum)
BP today 145/67   Refilled carvedilol 25 mg BID  Refilled Amlodipine- Benzepril 5-40 mg daily

## 2016-01-18 NOTE — Assessment & Plan Note (Signed)
Has not had any seizures since the time of meningioma dx.   Refilled Keppra 750 mg daily

## 2016-01-18 NOTE — Assessment & Plan Note (Signed)
Refilled omeprazole 20 mg daily

## 2016-01-21 NOTE — Progress Notes (Signed)
Internal Medicine Clinic Attending  I saw and evaluated the patient.  I personally confirmed the key portions of the history and exam documented by Dr. Blum and I reviewed pertinent patient test results.  The assessment, diagnosis, and plan were formulated together and I agree with the documentation in the resident's note. 

## 2016-01-23 ENCOUNTER — Telehealth: Payer: Self-pay | Admitting: *Deleted

## 2016-01-23 NOTE — Telephone Encounter (Signed)
PA for Diclofenac Gel 1% sent to [facility] on 01/22/2016.  Prior Authorization was approved 01/22/2016 through 02/15/2017.

## 2016-02-03 ENCOUNTER — Ambulatory Visit: Payer: Medicare Other | Admitting: Dietician

## 2016-02-12 ENCOUNTER — Other Ambulatory Visit: Payer: Self-pay

## 2016-02-12 DIAGNOSIS — Z794 Long term (current) use of insulin: Principal | ICD-10-CM

## 2016-02-12 DIAGNOSIS — E1169 Type 2 diabetes mellitus with other specified complication: Secondary | ICD-10-CM

## 2016-02-12 NOTE — Patient Outreach (Signed)
[place] [facility]) Care Management  02/12/2016  [name] [birth year] [id number]   Telephone call with patient's dau [name], who has permission to speak for the patient.  [name] reports her mother is doing well.  No new problems reported.  She reports no hypo or hyperglycemic episodes.  CBG this am was 138, which the dau stated is in her normal range.  Plan:  Daughter will follow up with DSS about PCA application.           Patient will keep PCP appt. On 04-10-16.           RN will follow up in January.  [name], RN, MSN [place] [phone] Fax [phone]

## 2016-02-19 ENCOUNTER — Encounter: Payer: Self-pay | Admitting: Internal Medicine

## 2016-02-19 DIAGNOSIS — Z79891 Long term (current) use of opiate analgesic: Secondary | ICD-10-CM | POA: Insufficient documentation

## 2016-03-11 ENCOUNTER — Other Ambulatory Visit: Payer: Self-pay

## 2016-03-11 NOTE — Patient Outreach (Signed)
[place] [facility]) Care Management  03/11/2016  [name] 04/02/1933 [id number]   Unsuccessful attempt to reach patient. HIPAA appropriate message left with call back information. If no response RN will make another attempt within 10 days.  [name], RN, MSN [place] [phone] Fax [phone]

## 2016-03-18 ENCOUNTER — Telehealth: Payer: Self-pay | Admitting: *Deleted

## 2016-03-18 ENCOUNTER — Ambulatory Visit: Payer: Medicare Other

## 2016-03-18 ENCOUNTER — Other Ambulatory Visit: Payer: Self-pay

## 2016-03-18 NOTE — Patient Outreach (Signed)
[place] [facility]) Care Management  03/18/2016  [name] 1933/04/14 [id number]   Unsuccessful attempt to reach patient.  HIPAA appropriate message left with contact information requesting call back.  If no response, RN will make another attempt within 10 days.  [name], RN, MSN [place] [phone] Fax [phone]

## 2016-03-18 NOTE — Telephone Encounter (Signed)
Pt's daughter calls and states pt just lost 1 of her 2 remaining brothers and now the last is dying, she is very upset and depressed, daughter states she is having diarrhea constantly and abd pain, she states it could be something else but she is worried about pt and wants to get her help before she becomes" really bad sick" appt today at 68, daughter is going to try to find transportation, she will call back if not possible

## 2016-03-18 NOTE — Telephone Encounter (Signed)
Tried to call to reschedule appt, no answer

## 2016-03-18 NOTE — Telephone Encounter (Signed)
Agree with plan to see patient in clinic.

## 2016-03-19 NOTE — Telephone Encounter (Signed)
Called again this morning, daughter states all in the household are sick now with same symptoms, it is diarrhea, cold, cough, h/a, fever on and off. She states pt is doing better and taking fluids well, using otc meds at this time and "waiting it out"

## 2016-03-19 NOTE — Telephone Encounter (Signed)
Okay, it's good that her symptoms are improving. If these symptoms continue for a week she should be rescheduled for evaluation in clinic.

## 2016-03-24 ENCOUNTER — Other Ambulatory Visit: Payer: Self-pay

## 2016-03-24 NOTE — Patient Outreach (Signed)
[place] [facility]) Care Management  03/24/2016  [name] 09-02-1933 [id number]   Third unsuccessful attempt to reach patient.  HIPAA appropriate message left requesting call back. If no response within 10 days, RN will perform case closure.  [name], RN, MSN [place] [phone] Fax [phone]

## 2016-03-30 ENCOUNTER — Telehealth: Payer: Self-pay | Admitting: *Deleted

## 2016-03-30 NOTE — Telephone Encounter (Addendum)
Call to Patient for Geriatric Task Force check.  Spoke with patient's daughter [name].  Patient is doing ok.  Has been dealing with the illness of 2 of her brothers.  1 of whom recently passed away away and the other  is doing better with a pace maker.  Daughter stated when asked that her mother is taking all of her meds both Diabetic and blood pressure.  Daughter stated that her mother's sugar was 238.  Only sugar given at this time.  Stated mom is also taking her Prozac when asked.  Daughter feels that patient is doing ok at this time.  Daughter also stated that patient has an appointment in March and had been asked if she wanted to come in sooner and patient said no.  Patient also has a nonproductive cough that she has had for a while at night mostly.  No fevers or problems breathing.  Daughter was encouraged to make an appointment for patient sooner if she felt the cough was getting worse.  Daughter agreed. Glucose was discussed with D. Plyler Diabetes Educator who felt that sugars are ok for the patient.  Sander Nephew, RN 03/30/2016 5:04 PM

## 2016-04-07 NOTE — Patient Outreach (Signed)
[facility]) Care Management  04/07/2016  [name] January 06, 1934 [id number]   Case closure effective 03-24-16 due to inability to maintain contact. Plan:  Notify patient and PCP.           Notify CMA.  [name], RN, MSN [place] [phone] Fax [phone]

## 2016-04-07 NOTE — Addendum Note (Signed)
Addended by: Meryl Dare on: 04/07/2016 11:41 AM   Modules accepted: Orders

## 2016-04-10 ENCOUNTER — Ambulatory Visit: Payer: Medicare Other | Admitting: Dietician

## 2016-04-10 ENCOUNTER — Encounter: Payer: Medicare Other | Admitting: Internal Medicine

## 2016-04-21 ENCOUNTER — Encounter: Payer: Medicare Other | Admitting: Internal Medicine

## 2016-04-21 ENCOUNTER — Encounter: Payer: Self-pay | Admitting: Internal Medicine

## 2016-04-21 ENCOUNTER — Ambulatory Visit: Payer: Medicare Other | Admitting: Dietician

## 2016-04-21 ENCOUNTER — Encounter: Payer: Self-pay | Admitting: Dietician

## 2016-04-21 NOTE — Progress Notes (Deleted)
Refills?  Microalbumin/crt   a1c 11.2 11/2015 *** add victoza?  Metformin 500 mg BID, lantus 30 u qHS

## 2016-04-21 NOTE — Telephone Encounter (Signed)
Diabetes & Nutrition Management: [name] was scheduled to see me today, but because she did not come, I called and spoke to her daughter [name] who is her caretaker.   Per [name]:  Food intake:  She "Eats good": not taking Glucerna, were waiting for it to come. Explained they may have to pay out of pocket but if she is eating and drinking well and her weight is stable ( which it is) It is optional and not needed.  1st meal: 10 am- some days steak biscuit from [place]- 2-3x a week, coffee with Splenda or other days may have hamburger and gravy, with broccoli and rice 2nd meal: 2-3 PM- light meal- chicken salad sandwich- whole, bottled coffee- can drink cold or hot- not sure if sweet- 16 oz bottle (Seondra to check to see how much sugar- I suggested if too high (more than 10 grams/bottle) consider looking for a light version or less sugar version) Snack: May snack before bed- seizure medicine makes her sleep so if she takes it 9-10 she may snack, if takes it earlier no snack.  Might drink a regular soda- not every day 1/2 cup every every few days  Medicine-  1.  metformin - twice a day and 2.  lantus 30 units she gives it herself with help of Seondra   Self monitoring [name] says her mother check her own blood sugar 3 times a day, Highest was 345, this am 120 fasting, last night 245 after dinner, overall they are better than they were preveiously  A1C target per EPIC is < 8%. We discussed goal is for [name]'s blood sugars to be between 150 and 250 to obtain and A1C ~ 8%, for her to enjoy eating and be as healthy as possbile. Assisted [name] with resetting the meter's battery and we discussed how to get a new battery otr meter which ever they prefer. We discussed what type of multivitamin [name] should take if she takes one. ( am going to try to find one in her [facility] book for them to order for free.) We discussed if [name]' blood sugars are too high putting her at risk for  infections and complication from high blood sugars, if they'd want to work on her diet more or take additional medicine. [name] says additional medication would be okay. Reschedule MNT appointment for same day as next doctor appointment

## 2016-05-07 NOTE — Addendum Note (Signed)
Addended by: Hulan Fray on: 05/07/2016 08:23 PM   Modules accepted: Orders

## 2016-06-04 ENCOUNTER — Other Ambulatory Visit: Payer: Self-pay

## 2016-06-04 NOTE — Telephone Encounter (Signed)
Received call back from pt's dtr stating the prescribing doctor's credentials were no longer valid and rx could not be processed.  Called made to pharmacy-[facility]um[facility] DEA # given to pharmacist, problem resolved.     [name] also wanted me to let pcp know that pt is no longer taking the meloxicam because it does not work.  Patient currently taking one tylenol # 3 in the morning and on in the evening.  Dtr is requesting qty be increased from #40 to #60 a month. Has appt 08/18/2016.  Please advise.[name]4/19/201811:24 AM

## 2016-06-04 NOTE — Telephone Encounter (Signed)
I want to evaluate her before going up on the tylenol #3.

## 2016-06-04 NOTE — Telephone Encounter (Signed)
Question about acetaminophen-codeine (TYLENOL #3) 300-30 MG tablet. Please call back.

## 2016-06-04 NOTE — Telephone Encounter (Signed)
Returned call from pt's dtr-no answer, message left on recorder @ [phone].  I contacted [facility] on [address] and they stated pt has one refill left on Tylenol #3 rx,#40.   Pharmacy instructed to fill medication.  Attempted to contact dtr and maker her aware, but no answer.  Of note, [facility] [address], but had it transferred to [facility] location because her pharmacy was out.  Pt will have to pick up this refill at [facility] location.  She will be able to transfer rx back to [facility] once she gets a new rx.  No further action needed at this time.

## 2016-06-11 NOTE — Telephone Encounter (Signed)
Call made to pt's dtr to inform her that pt needs to be evaluated by MD before going up on tylenol #3.  Family agreed to keep appt in 08/18/2016.[name]4/26/20183:48 PM

## 2016-08-18 ENCOUNTER — Ambulatory Visit (INDEPENDENT_AMBULATORY_CARE_PROVIDER_SITE_OTHER): Payer: Medicare Other | Admitting: Internal Medicine

## 2016-08-18 VITALS — BP 126/98 | HR 92 | Temp 98.3°F | Wt 210.0 lb

## 2016-08-18 DIAGNOSIS — Z794 Long term (current) use of insulin: Secondary | ICD-10-CM | POA: Diagnosis not present

## 2016-08-18 DIAGNOSIS — I110 Hypertensive heart disease with heart failure: Secondary | ICD-10-CM

## 2016-08-18 DIAGNOSIS — Z87891 Personal history of nicotine dependence: Secondary | ICD-10-CM

## 2016-08-18 DIAGNOSIS — M170 Bilateral primary osteoarthritis of knee: Secondary | ICD-10-CM | POA: Diagnosis not present

## 2016-08-18 DIAGNOSIS — F329 Major depressive disorder, single episode, unspecified: Secondary | ICD-10-CM | POA: Diagnosis not present

## 2016-08-18 DIAGNOSIS — I5032 Chronic diastolic (congestive) heart failure: Secondary | ICD-10-CM | POA: Diagnosis not present

## 2016-08-18 DIAGNOSIS — H9193 Unspecified hearing loss, bilateral: Secondary | ICD-10-CM | POA: Diagnosis not present

## 2016-08-18 DIAGNOSIS — E1140 Type 2 diabetes mellitus with diabetic neuropathy, unspecified: Secondary | ICD-10-CM

## 2016-08-18 DIAGNOSIS — E1165 Type 2 diabetes mellitus with hyperglycemia: Principal | ICD-10-CM

## 2016-08-18 DIAGNOSIS — E785 Hyperlipidemia, unspecified: Secondary | ICD-10-CM

## 2016-08-18 DIAGNOSIS — M25561 Pain in right knee: Secondary | ICD-10-CM

## 2016-08-18 DIAGNOSIS — IMO0002 Reserved for concepts with insufficient information to code with codable children: Secondary | ICD-10-CM

## 2016-08-18 DIAGNOSIS — Z79899 Other long term (current) drug therapy: Secondary | ICD-10-CM

## 2016-08-18 DIAGNOSIS — F3342 Major depressive disorder, recurrent, in full remission: Secondary | ICD-10-CM

## 2016-08-18 DIAGNOSIS — Z6836 Body mass index (BMI) 36.0-36.9, adult: Secondary | ICD-10-CM | POA: Diagnosis not present

## 2016-08-18 DIAGNOSIS — Z86711 Personal history of pulmonary embolism: Secondary | ICD-10-CM | POA: Diagnosis not present

## 2016-08-18 DIAGNOSIS — E1142 Type 2 diabetes mellitus with diabetic polyneuropathy: Secondary | ICD-10-CM

## 2016-08-18 DIAGNOSIS — H903 Sensorineural hearing loss, bilateral: Secondary | ICD-10-CM

## 2016-08-18 DIAGNOSIS — G8929 Other chronic pain: Secondary | ICD-10-CM

## 2016-08-18 LAB — POCT GLYCOSYLATED HEMOGLOBIN (HGB A1C): Hemoglobin A1C: 8

## 2016-08-18 LAB — GLUCOSE, CAPILLARY: GLUCOSE-CAPILLARY: 207 mg/dL — AB (ref 65–99)

## 2016-08-18 NOTE — Patient Instructions (Signed)
[name],   I will call you to check and see how your knee is feeling.   I will send a request for your to see a hearing doctor.   Schedule a follow up appointment in 3 months   Call the clinic if you need anything

## 2016-08-18 NOTE — Progress Notes (Addendum)
CC: gradual loss of hearing   HPI:  [name] is a 83 y.o. PMH retention, hyperlipidemia, chronic diastolic CHF, uncontrolled type 2 diabetes with diabetic neuropathy, remote history of provoked pulmonary embolism not on anticoagulants, meningioma and seizure, GERD, depression who comes to [facility] for follow up of chronic knee pain.   Past Medical History:  Diagnosis Date  . Adenocarcinoma of lung ([place])     Right upper lobe adenocarcinoma. s/p right lower lobectomy 12/15/10  . Arthritis   . ARTHRITIS, KNEE 03/24/2006  . Brain cancer ([place])    7 cm cerebral right atypical meningoma grade II  . CHF (congestive heart failure) ([place])   . CVA (cerebral infarction) 7-yrs ago  . CVA (cerebrovascular accident) ([place]) 0350    right embolic stroke, no residual deficits  . Depression 12/02/2010  . Diabetes mellitus 2007   HgA1C (02/20/2010) = 9.2, HgA1C (03/20/2009) = 12.1  . DIABETES MELLITUS, TYPE II 2000  . Diverticulitis   . Dysphagia   . GERD 12/04/2005  . Hemangioma of liver 12/02/2010  . HYPERLIPIDEMIA 12/04/2005  . HYPERTENSION 12/04/2005  . Lung mass 07/08/2010  . Meningioma ([place]) 07/21/2010   Review of Systems:  Please refer to HPI and assessment and plans tab for pertinent review of systems, all others reviewed and negative.   Physical Exam:  Vitals:   08/18/16 1543  BP: (!) 126/98  Pulse: 92  Temp: 98.3 F (36.8 C)  TempSrc: Oral  SpO2: 99%  Weight: 210 lb (95.3 kg)   Physical Exam  Constitutional: She is oriented to person, place, and time. She appears well-developed and well-nourished. No distress.  HENT:  Head: Normocephalic and atraumatic.  She ask for repeat of what is said at multiple times during the conversation. On webers tuning fork testing she cannot hear the fork in either ear and on Rinnes she can not hear with bone conduction but hears with air conduction.   Neck: Normal range of motion.  Cardiovascular: Normal rate and regular rhythm.    No murmur heard. Pulmonary/Chest: Effort normal. No respiratory distress. She has no wheezes. She has no rales.  Abdominal: Soft. Bowel sounds are normal. She exhibits no distension. There is no tenderness. There is no guarding.  Musculoskeletal:  Crepitus in right knee > left. No effusion appreciated. Negative valgus, varus, anterior drawer, and McMurphys test. No hip pain with internal and external rotation   Neurological: She is alert and oriented to person, place, and time.  Skin: Skin is warm and dry. She is not diaphoretic.  Psychiatric: She has a normal mood and affect. Her behavior is normal.    Assessment & Plan:   Knee pain  She has chronic knee pain, today the right knee is hurting the worst. Her pain limits her from simply moving around her house and sometimes keeps her up at night. The pain is worse in the morning. She has been taking tylenol with codeine but this along with the keppra which she needs for seizure prophylaxis have caused her to feel more tired throughout the day.  I have reviewed the last xrays 09/2014 showed mild degenerative changes of the left knee without changes in the right. These xrays were not upright and it is likely that although it was not appreciated on film she has degenerative change in the right as well. Another differential is patelo-femoral pain, with either diagnosis we will need to have the discussion of knee replacement if she continues to require steroid injections. She  should have follow up knee xrays (standing AP and sunrise) at next visit. - corticosteroid injection today  - no prescriptions for tylenol with codeine provided today  Severe obesity (BMI 36) with comorbidity  She has diabetes, osteoarthritis, and hypertension related to obesity. This chronic knee pain has really limited her mobility today so my hope is that this steroid injection will help her to start moving again and lose weight.  - continue diabetes management and blood  pressure control  - knee injection for OA limiting mobility   Depression  PHQ-2 = 0. She denies feeling depressed, having little interest, or feeling down.  - continue Fluoxetine 10 mg daily   Hearing loss  Weber (no lateralization) and Rinnes ( air conduction > bone conduction )  test are suggestive of bilateral sensorineural hearing loss. [name] is frustrated by this hearing loss and I discussed that this may mean she needs hearing aids and she is open to this.  - Referral to audiology   Chronic diastolic heart failure  Last echo 03/2013 showed EF 60-65%, G1DD. Denies orthopnea or leg swelling.  - continue amlodipine - benzepril 5-40 mg daily - Continue coreg 25 mg BID  Diabetes mellitus  A1c today 8.0 improved from 11.2 in October 2017. Metformin dose was increased at last visit and she has tolerated metformin ER well without GI upset, although she has had that in the past with IR.  She reports that she uses the lantus about 4 times per week. She reports that her meter is broken.  - continue metformin ER 500 mg BID  - continue lantus 30 units daily  - I have reached out to [facility] regarding new meter supplies and will order what is appropriate for [name] insurance coverage   ADDENDUM [name] called and spoke with the patient, I have ordered an accucheck meter   PROCEDURE NOTE  PROCEDURE: right knee joint steroid injection.  PREOPERATIVE DIAGNOSIS: Osteoarthritis of the right knee.  POSTOPERATIVE DIAGNOSIS: Osteoarthritis of the right knee.  PROCEDURE: The patient was apprised of the risks and the benefits of the procedure and informed consent was obtained, as witnessed by Jeremy Johann. Time-out procedure was performed, with confirmation of the patient's name, date of birth, and correct identification of the right knee to be injected. The patient's knee was then marked at the appropriate site for injection placement. The knee was sterilely prepped with Betadine. A  2 mL solution of Kenalog was drawn up into a 5 mL syringe with a 1 mL of 1% lidocaine. The patient was injected with a 25-gauge needle at the anterior medial aspect of her right flexed knee. There were no complications. The patient tolerated the procedure well. There was minimal bleeding. The patient was instructed to ice her knee upon leaving clinic and refrain from overuse over the next 3 days. The patient was instructed to go to the emergency room with any usual pain, swelling, or redness occurred in the injected area. The patient was given a followup appointment to evaluate response to the injection to his increased range of motion and reduction of pain.  The procedure was supervised by attending physician, Dr. Heber Englewood.  See Encounters Tab for problem based charting.  Patient discussed with Dr. Angelia Mould

## 2016-08-20 DIAGNOSIS — E66811 Obesity, class 1: Secondary | ICD-10-CM | POA: Insufficient documentation

## 2016-08-20 DIAGNOSIS — H9190 Unspecified hearing loss, unspecified ear: Secondary | ICD-10-CM | POA: Insufficient documentation

## 2016-08-20 DIAGNOSIS — E669 Obesity, unspecified: Secondary | ICD-10-CM | POA: Insufficient documentation

## 2016-08-20 NOTE — Assessment & Plan Note (Signed)
She has chronic knee pain, today the right knee is hurting the worst. Her pain limits her from simply moving around her house and sometimes keeps her up at night. The pain is worse in the morning. She has been taking tylenol with codeine but this along with the keppra which she needs for seizure prophylaxis have caused her to feel more tired throughout the day.  last xrays 09/2014 showed mild degenerative changes of the left knee without changes in the right. These xrays were not upright and it is likely that although it was not appreciated on film she has degenerative change in the right as well. Another differential is patelo-femoral pain, with either diagnosis we will need to have the discussion of knee replacement if she continues to require steroid injections. She should have follow up knee xrays (standing AP and sunrise) at next visit. - corticosteroid injection today  - no prescriptions for tylenol with codeine provided today

## 2016-08-20 NOTE — Assessment & Plan Note (Signed)
She has diabetes, osteoarthritis, and hypertension related to obesity. This chronic knee pain has really limited her mobility today so my hope is that this steroid injection will help her to start moving again and lose weight.  - continue diabetes management and blood pressure control  - knee injection for OA limiting mobility

## 2016-08-20 NOTE — Assessment & Plan Note (Signed)
>>  ASSESSMENT AND PLAN FOR CHRONIC DIASTOLIC CONGESTIVE HEART FAILURE ([facility]) WRITTEN ON 08/20/2016 10:01 PM BY BLUM, NINA, MD  Last echo 03/2013 showed EF 60-65%, G1DD. Denies orthopnea or leg swelling.  - continue amlodipine  - benzepril 5-40 mg daily - Continue coreg  25 mg BID

## 2016-08-20 NOTE — Assessment & Plan Note (Addendum)
A1c today 8.0 improved from 11.2 in October 2017. Metformin dose was increased at last visit and she has tolerated metformin ER well without GI upset, although she has had that in the past with IR.  She reports that she uses the lantus about 4 times per week. She reports that her meter is broken.  - continue metformin ER 500 mg BID  - continue lantus 30 units daily  - I have reached out to [facility] regarding new meter supplies and will order what is appropriate for [name]'s insurance coverage   ADDENDUM [name] called and spoke with the patient, I have ordered an accucheck meter

## 2016-08-20 NOTE — Assessment & Plan Note (Signed)
Weber (no lateralization) and Rinnes ( air conduction > bone conduction )  test are suggestive of bilateral sensorineural hearing loss. This seems to be progressive hearing loss, her only new medication is metformin and review of the literature for this does not show that its associated with hearing loss. [name] is frustrated by this hearing loss and I discussed that this may mean she needs hearing aids and she is open to this.  - Referral to audiology

## 2016-08-20 NOTE — Assessment & Plan Note (Signed)
PHQ-2 = 0. She denies feeling depressed, having little interest, or feeling down.  - continue Fluoxetine 10 mg daily

## 2016-08-20 NOTE — Assessment & Plan Note (Signed)
Last echo 03/2013 showed EF 60-65%, G1DD. Denies orthopnea or leg swelling.  - continue amlodipine - benzepril 5-40 mg daily - Continue coreg 25 mg BID

## 2016-08-21 ENCOUNTER — Other Ambulatory Visit: Payer: Self-pay | Admitting: Dietician

## 2016-08-21 DIAGNOSIS — E1140 Type 2 diabetes mellitus with diabetic neuropathy, unspecified: Secondary | ICD-10-CM

## 2016-08-21 DIAGNOSIS — E1165 Type 2 diabetes mellitus with hyperglycemia: Principal | ICD-10-CM

## 2016-08-21 DIAGNOSIS — Z794 Long term (current) use of insulin: Principal | ICD-10-CM

## 2016-08-21 DIAGNOSIS — IMO0002 Reserved for concepts with insufficient information to code with codable children: Secondary | ICD-10-CM

## 2016-08-21 MED ORDER — ACCU-CHEK GUIDE W/DEVICE KIT: 1.0000 | PACK | Freq: Three times a day (TID) | 0 refills | Status: DC

## 2016-08-21 MED ORDER — ACCU-CHEK FASTCLIX LANCETS MISC: 12 refills | Status: DC

## 2016-08-21 MED ORDER — GLUCOSE BLOOD VI STRP: ORAL_STRIP | 12 refills | Status: DC

## 2016-08-21 NOTE — Telephone Encounter (Signed)
Called Ms. Altmann about obtaining a new blood glucose meter. Her daughter answered and said they do not have a preference. Another accu chek meter would be fine . This is covered by [place] health care.  Request prescriptions for accu chek guide meter, strips and fast clix lancets

## 2016-08-21 NOTE — Addendum Note (Signed)
Addended by: Meryl Dare on: 08/21/2016 07:39 AM   Modules accepted: Level of Service

## 2016-08-23 NOTE — Progress Notes (Signed)
Internal Medicine Clinic Attending  Case discussed with Dr. Blum at the time of the visit.  We reviewed the resident's history and exam and pertinent patient test results.  I agree with the assessment, diagnosis, and plan of care documented in the resident's note. 

## 2016-09-09 LAB — HM DIABETES EYE EXAM

## 2016-09-11 ENCOUNTER — Encounter: Payer: Self-pay | Admitting: *Deleted

## 2016-09-17 ENCOUNTER — Other Ambulatory Visit: Payer: Self-pay | Admitting: Internal Medicine

## 2016-09-17 DIAGNOSIS — I10 Essential (primary) hypertension: Secondary | ICD-10-CM

## 2016-09-19 IMAGING — DX DG CHEST 2V
2 series · 2 of 2 positions shown · non-contrast
Comparison: Chest radiograph performed 07/01/2014

CLINICAL DATA: Status post unwitnessed fall. Concern for chest
injury. Initial encounter.

EXAM:
CHEST  2 VIEW

[chest lat]
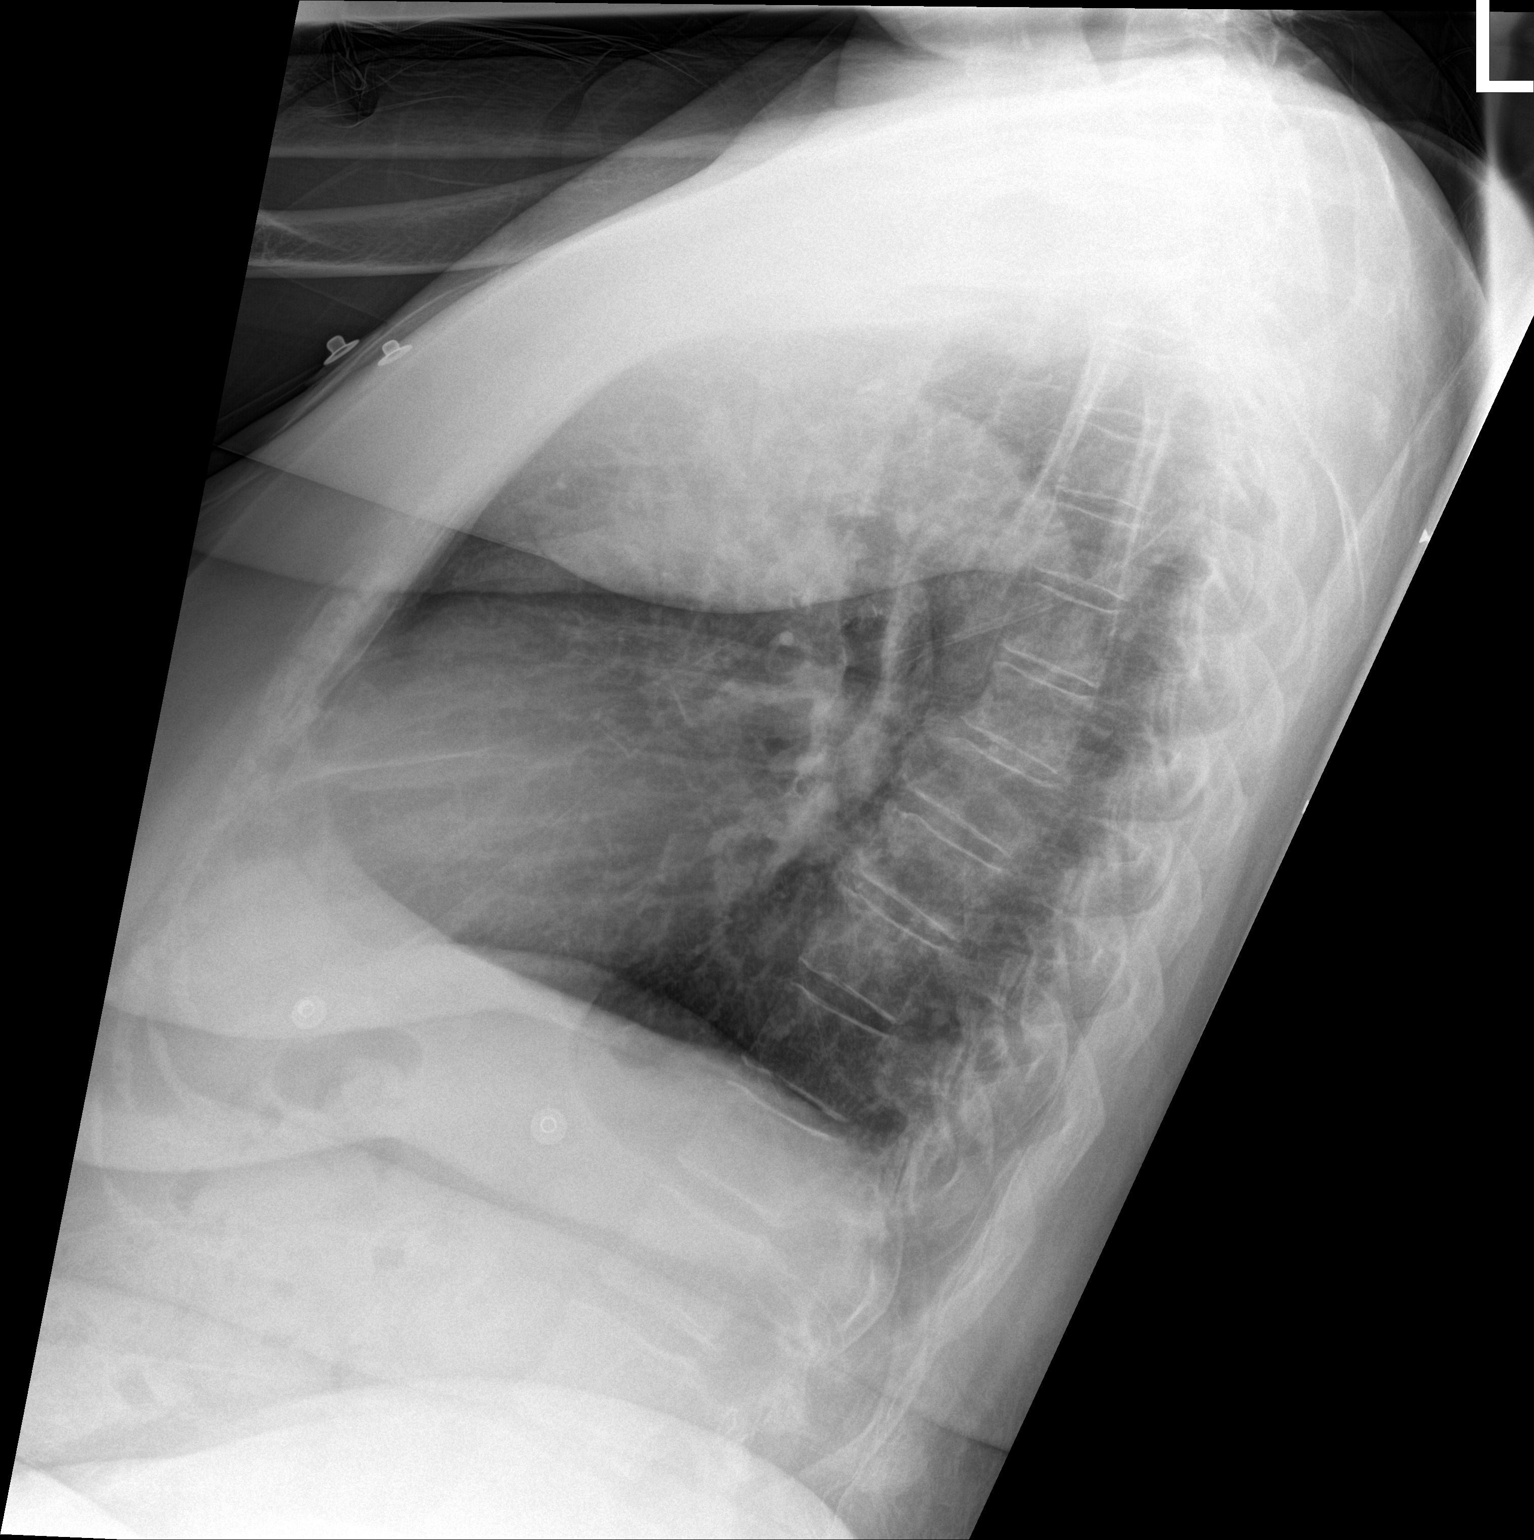

[chest ap]
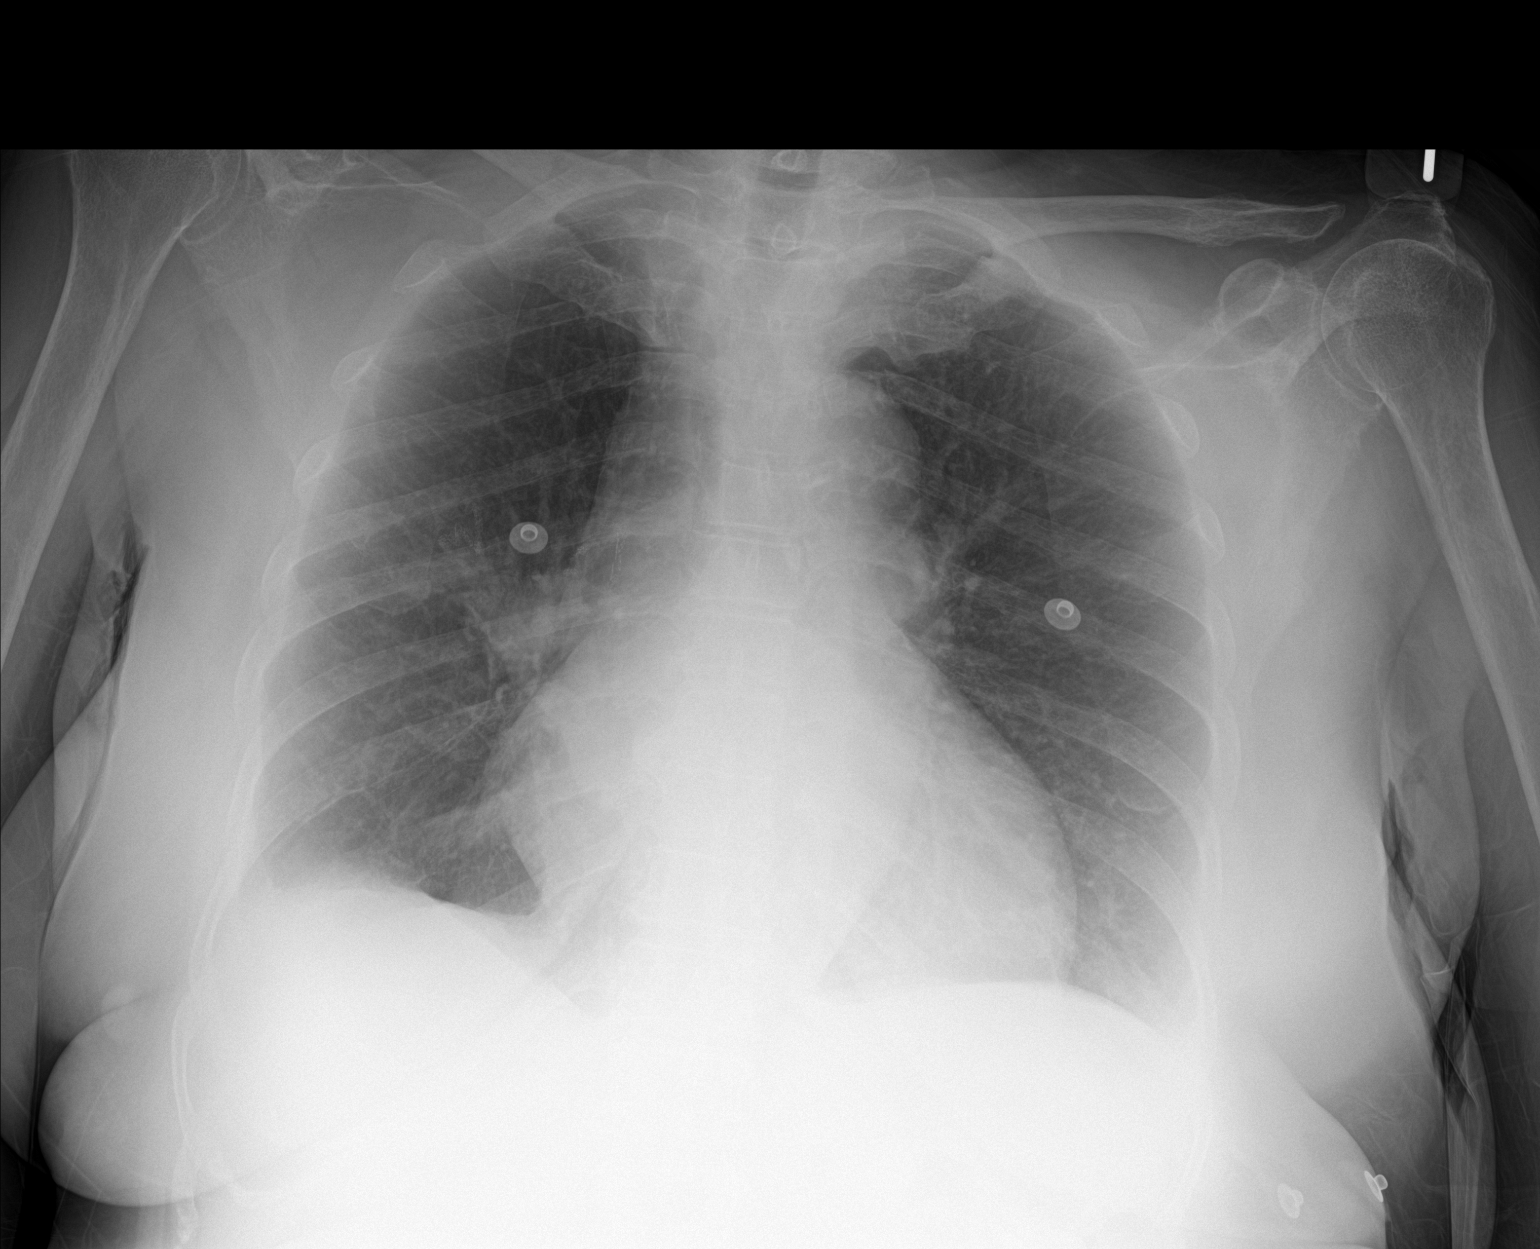

[2 of 2 positions shown; findings below may reference images not displayed]

FINDINGS: The lungs are well-aerated. Mild right midlung scarring is again
noted. There is no evidence of focal opacification, pleural effusion
or pneumothorax.

The heart is mildly enlarged. No acute osseous abnormalities are
seen.
IMPRESSION: Mild right mid lung scarring again noted.  Mild cardiomegaly.

## 2016-09-21 IMAGING — DX DG CHEST 2V
2 series · 2 of 2 positions shown · non-contrast
Comparison: 10/06/2014

CLINICAL DATA: Fever.

EXAM:
CHEST  2 VIEW

[x chest ap]
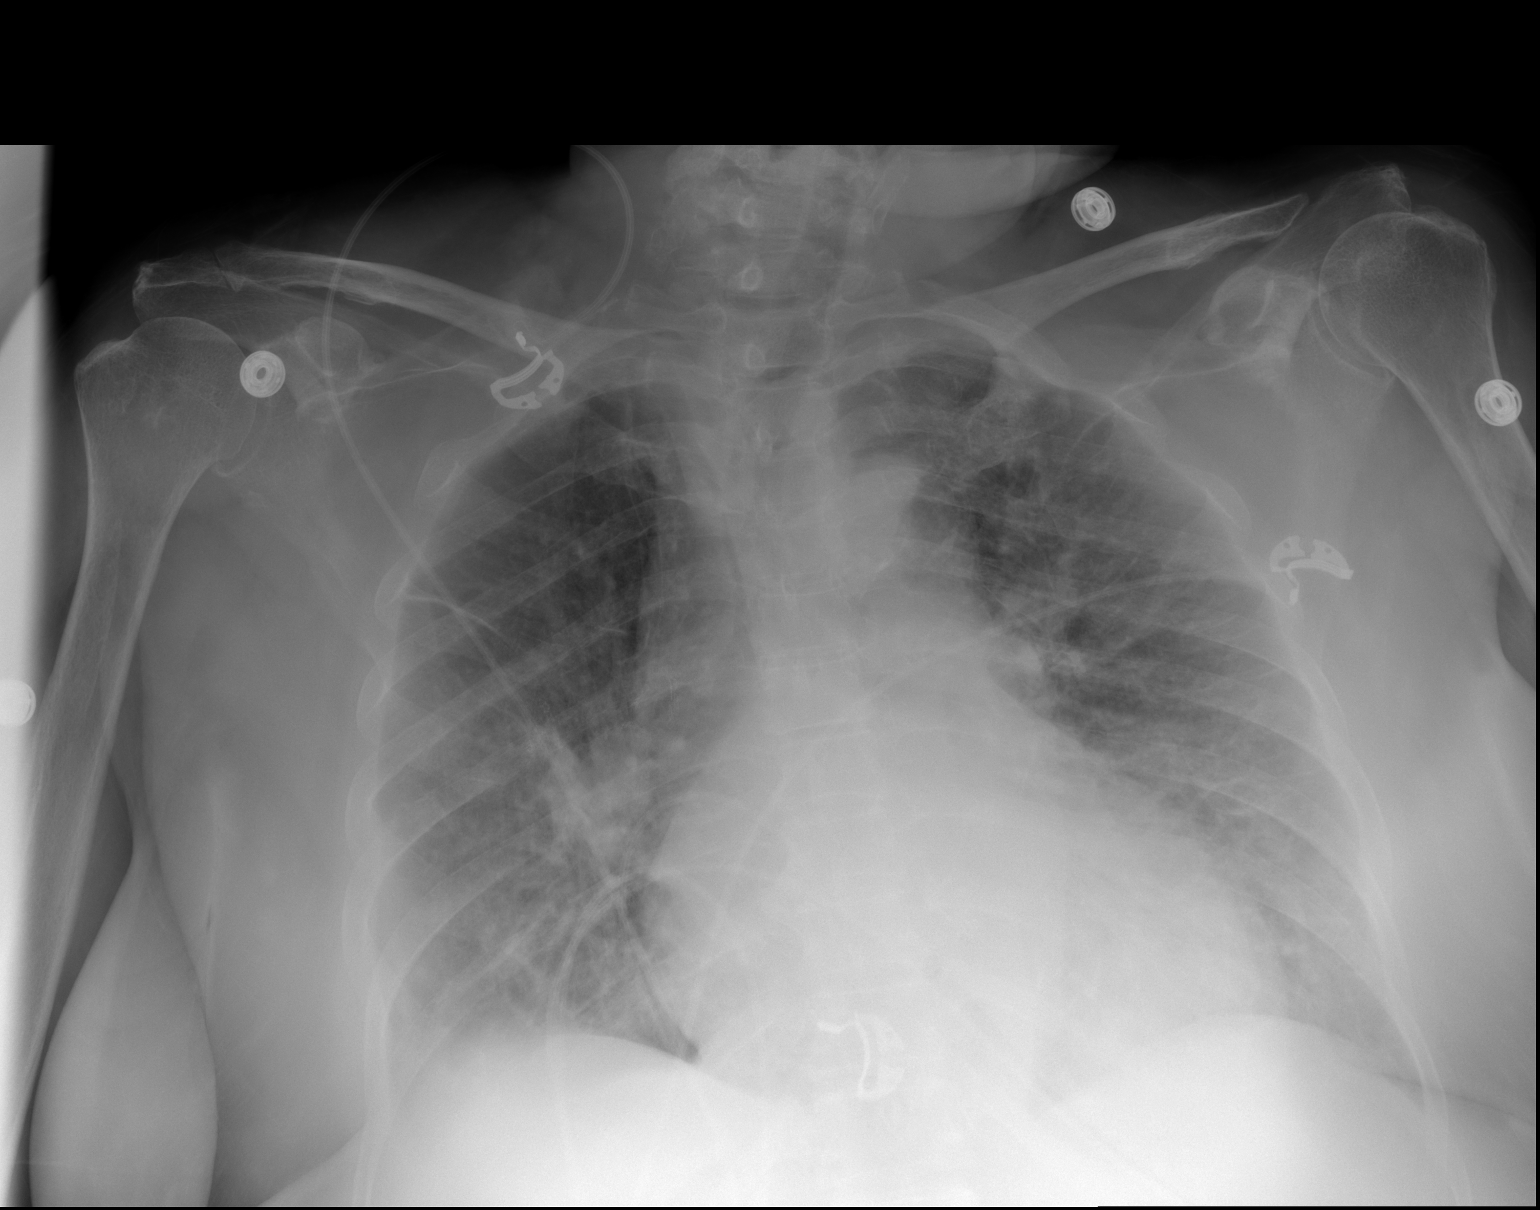

[w chest lat]
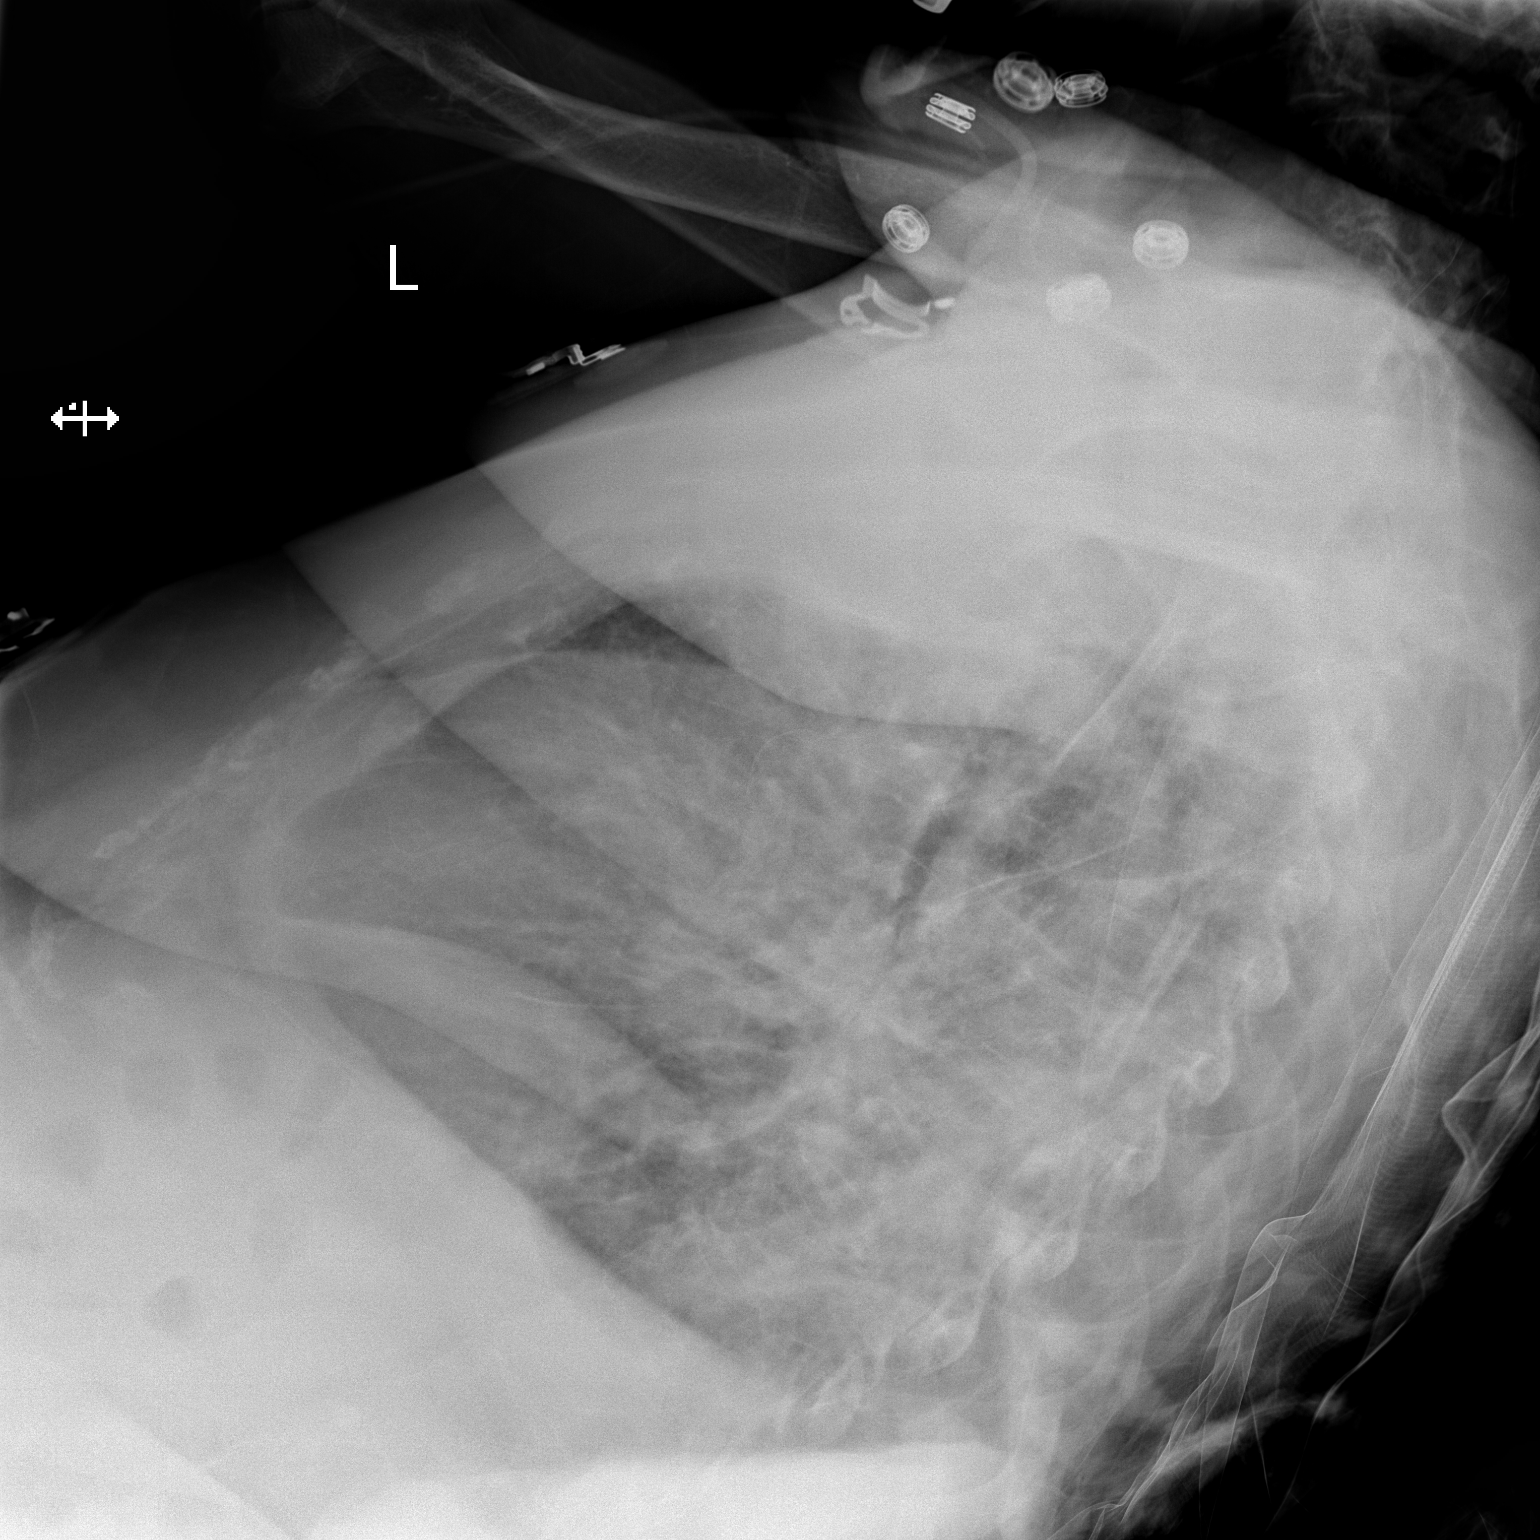

[2 of 2 positions shown; findings below may reference images not displayed]

FINDINGS: Shallow inspiration. Mild cardiac enlargement. Normal pulmonary
vascularity. Bilateral perihilar and left peripheral interstitial
infiltration could indicate interstitial pneumonia or edema. No
definite blunting of costophrenic angles although positioning on the
lateral view is limited. No pneumothorax. Calcified and tortuous
aorta. Degenerative changes in both shoulders. Postoperative
resection of distal left clavicle.
IMPRESSION: Cardiac enlargement. Perihilar and left peripheral interstitial
infiltrates may indicate edema or interstitial pneumonia.

## 2016-09-30 ENCOUNTER — Other Ambulatory Visit: Payer: Self-pay | Admitting: *Deleted

## 2016-09-30 DIAGNOSIS — I10 Essential (primary) hypertension: Secondary | ICD-10-CM

## 2016-09-30 MED ORDER — AMLODIPINE BESY-BENAZEPRIL HCL 5-40 MG PO CAPS: 1.0000 | ORAL_CAPSULE | Freq: Every day | ORAL | 1 refills | Status: DC

## 2016-10-02 ENCOUNTER — Telehealth: Payer: Self-pay | Admitting: Dietician

## 2016-10-02 DIAGNOSIS — IMO0002 Reserved for concepts with insufficient information to code with codable children: Secondary | ICD-10-CM

## 2016-10-02 DIAGNOSIS — Z794 Long term (current) use of insulin: Principal | ICD-10-CM

## 2016-10-02 DIAGNOSIS — E1165 Type 2 diabetes mellitus with hyperglycemia: Principal | ICD-10-CM

## 2016-10-02 DIAGNOSIS — E1140 Type 2 diabetes mellitus with diabetic neuropathy, unspecified: Secondary | ICD-10-CM

## 2016-10-02 MED ORDER — ACCU-CHEK GUIDE W/DEVICE KIT: 1.0000 | PACK | Freq: Three times a day (TID) | 0 refills | Status: DC

## 2016-10-02 MED ORDER — GLUCOSE BLOOD VI STRP: ORAL_STRIP | 12 refills | Status: DC

## 2016-10-02 MED ORDER — ACCU-CHEK FASTCLIX LANCETS MISC: 12 refills | Status: DC

## 2016-10-02 NOTE — Telephone Encounter (Signed)
Per [facility] she cannot fill testing supplies  at [facility], her insurance plan makes her get them from [facility].

## 2016-11-13 DIAGNOSIS — D1803 Hemangioma of intra-abdominal structures: Secondary | ICD-10-CM | POA: Insufficient documentation

## 2016-11-13 NOTE — Progress Notes (Deleted)
PMH G1DD, uncontrolled insulin-dependent T2DM w peripheral neuropathy, HTN, HLD, iron deficiency anemia, adenocarcinoma of the lung (stage 1B, +EGFR s/p lobectomy w lymph node dissection), hx of meningioma, seizure disorder, GERD, severe obesity, depression  - foot exam & flu shot  - CBC, Ferritin, A1c, BMP  - urine microalbumin/crt  - Follow up with oncology   Iron deficiency anemia **Denies bleeding, fatigue, shortness of breath, dizziness, muscle cramps, or syncope.  - Follow up CBC & Ferritin today -Continue oral iron polysaccharidesupplementation  Adenocarcinoma of the right upper lobe  **Denies fatigue, cough,shortness of breath, metastasis, weight loss or night sweats. Had ectomy with lymph node dissection in 2012. Last PET 2012 was concerning for increase in size and metabolic activity of the right upper lobe mass evidence of metastasis. Last seen by oncology (Dr. Julien Nordmann) in 2013, at that time the treatment plan was observation and follow-up for CT scan with contrast in 6 months.   Hyperlipidemia  Denies side effects of statin therapy.  - continue simvastatin 20 mg qd   History of provoked pulmonary embolism  ** Denies sob, hemoptysis. Normal SpO2 today. No calf swelling or tenderness.   History of CVA  ** No new neuro deficits  - continue aspirin 81 mg daily and simvastatin 20 mg daily  - continue to monitor   Hypertension BP Readings from Last 3 Encounters:  08/18/16 (!) 126/98  01/15/16 (!) 145/67  12/03/15 (!) 169/73  Goal <130/80. **Denies headaches, blurry vision, or orthostasis.  - continue amlodipine- benazepril 4-50 mg daily and carvedilol 25 mg bid - BMP today  - ** encouraged exercise and healthy eating   Type 2 DM on insulin with peripheral neuropathy  Last A1c 7/18 8.0. **No meter today. **Denies symptoms of hypoglycemia. Last eye exam 7/18 - no retinopathy. Last urine microalbumin/crt 2010 - 14.2.  - continue Lantus 30 units qHS, metformin 500 mg bid and  CBG monitoring  - foot exam and microalbumin/crt today

## 2016-11-17 ENCOUNTER — Encounter: Payer: Medicare Other | Admitting: Internal Medicine

## 2016-11-27 ENCOUNTER — Telehealth: Payer: Self-pay | Admitting: *Deleted

## 2016-11-27 NOTE — Telephone Encounter (Signed)
Call made to patient on behalf of the Geriatric [employer].Marland Kitchenspoke with pt's grand-dtr who stated that pt was taking medications as directed and has not had any problems using the new meter or obtaining medication.  States pt's blood sugars are around 138. Pt seen by pcp on 08/18/16, but was a no-show for 11/17/16 appt..  I contacted pt's pharmacy for refill history as grand-dtr did not go over meds on the phone.  Per pharmacy, metformin last filled  04/20/16 (30mth supply),  lantus last filled 02/02/16, and lotrel last filled 09/2016.  Refill history does not reflect what grand-dtr is saying, although pt's A1C has improved and her bp is at goal.  Will forward appt request to front office, since pt missed her follow up appt. Will contact pt's family to make sure she brings in all her medication.[name]10/12/201811:36 AM   (Note below copied from previous office visit with pcp on 08/18/2016)  Diabetes mellitus  A1c today 8.0 improved from 11.2 in October 2017. Metformin dose was increased at last visit and she has tolerated metformin ER well without GI upset, although she has had that in the past with IR.  She reports that she uses the lantus about 4 times per week. She reports that her meter is broken.  - continue metformin ER 500 mg BID  - continue lantus 30 units daily  - I have reached out to [facility] regarding new meter supplies and will order what is appropriate for Ms. Forbess's insurance coverage   ADDENDUM [name] called and spoke with the patient, I have ordered an accucheck meter

## 2016-12-22 ENCOUNTER — Encounter: Payer: Medicare Other | Admitting: Internal Medicine

## 2016-12-22 NOTE — Progress Notes (Deleted)
[name]  - foot exam & flu shot  - CBC, Ferritin, A1c   - urine microalbumin/crt  - Follow up with oncology    PMH G1DD, uncontrolled insulin-dependent T2DM w peripheral neuropathy, HTN, HLD, iron deficiency anemia, adenocarcinoma of the lung (stage 1B, +EGFR s/p lobectomy w lymph node dissection), hx of meningioma, hx of CVA, seizure disorder, GERD, severe obesity, depression   Iron deficiency anemia  **Denies bleeding, fatigue, shortness of breath, dizziness, muscle cramps, or syncope.  - Follow up CBC & Ferritin today  -Continue oral iron polysaccharidesupplementation   Adenocarcinoma of the right upper lobe  **Denies fatigue, cough,shortness of breath, metastasis, weight loss or night sweats. Had ectomy with lymph node dissection in 2012. Last PET 2012 was concerning for increase in size and metabolic activity of the right upper lobe mass evidence of metastasis. Last seen by oncology (Dr. Julien Nordmann) in 2013, at that time the treatment plan was observation and follow-up for CT scan with contrast in 6 months.   Hyperlipidemia  Denies side effects of statin therapy.  - continue simvastatin 20 mg qd   History of provoked pulmonary embolism  ** Denies sob, hemoptysis. Normal SpO2 today. No calf swelling or tenderness.   History of CVA  ** No new neuro deficits  - continue aspirin 81 mg daily and simvastatin 20 mg daily  - continue to monitor   Hypertension  BP Readings from Last 3 Encounters:  08/18/16 (!) 126/98  01/15/16 (!) 145/67  12/03/15 (!) 169/73  Goal <130/80. Denies headaches or orthostasis.  - amlodipine- benazepril 4-50 mg daily and carvedilol 25 mg bid   Type 2 DM on insulin with peripheral neuropathy  Last A1c 7/18 8.0. **No meter today. **Denies symptoms of hypoglycemia. Last eye exam 7/18 - no retinopathy. Last urine microalbumin/crt 2010 - 14.2.  - continue Lantus 30 units qHS, metformin 500 mg bid and CBG monitoring  - foot exam and microalbumin/crt today

## 2017-01-12 ENCOUNTER — Encounter: Payer: Medicare Other | Admitting: Internal Medicine

## 2017-01-12 NOTE — Progress Notes (Deleted)
65 F PMH G1DD, uncontrolled insulin-dependent T2DM w peripheral neuropathy, HTN, HLD, iron deficiency anemia, adenocarcinoma of the lung (stage 1B, +EGFR s/p lobectomy w lymph node dissection), hx of meningioma, seizure disorder, GERD, severe obesity, depression   - foot exam & flu shot  - LAB: maybe CBC, Ferritin, A1c, BMP    - urine microalbumin/crt  - Follow up with oncology   Iron deficiency anemia  **Denies bleeding, fatigue, shortness of breath, dizziness, muscle cramps, or syncope.  - Follow up CBC & Ferritin today  -Continue oral iron polysaccharidesupplementation   Adenocarcinoma of the right upper lobe  **Denies fatigue, cough,shortness of breath, metastasis, weight loss or night sweats. Had ectomy with lymph node dissection in 2012. Last PET 2012 was concerning for increase in size and metabolic activity of the right upper lobe mass evidence of metastasis. Last seen by oncology (Dr. Julien Nordmann) in 2013, at that time the treatment plan was observation and follow-up for CT scan with contrast in 6 months.   Hyperlipidemia  Denies side effects of statin therapy.  - continue simvastatin 20 mg qd   History of provoked pulmonary embolism  ** Denies sob, hemoptysis. Normal SpO2 today. No calf swelling or tenderness.   History of CVA  ** No new neuro deficits  - continue aspirin 81 mg daily and simvastatin 20 mg daily  - continue to monitor   Hypertension  BP Readings from Last 3 Encounters:  08/18/16 (!) 126/98  01/15/16 (!) 145/67  12/03/15 (!) 169/73  Goal <130/80. Denies headaches or orthostasis.  - amlodipine- benazepril 4-50 mg daily and carvedilol 25 mg bid   Type 2 DM on insulin with peripheral neuropathy  Last A1c 7/18 8.0. **No meter today. **Denies symptoms of hypoglycemia. Last eye exam 7/18 - no retinopathy. Last urine microalbumin/crt 2010 - 14.2.  - continue Lantus 30 units qHS, metformin 500 mg bid and CBG monitoring  - foot exam and microalbumin/crt today

## 2017-01-26 ENCOUNTER — Encounter: Payer: Medicare Other | Admitting: Internal Medicine

## 2017-02-01 ENCOUNTER — Other Ambulatory Visit: Payer: Self-pay

## 2017-02-01 ENCOUNTER — Ambulatory Visit (INDEPENDENT_AMBULATORY_CARE_PROVIDER_SITE_OTHER): Payer: Medicare Other | Admitting: Internal Medicine

## 2017-02-01 ENCOUNTER — Encounter: Payer: Self-pay | Admitting: Internal Medicine

## 2017-02-01 VITALS — BP 124/59 | HR 72 | Temp 98.0°F | Wt 202.4 lb

## 2017-02-01 DIAGNOSIS — I10 Essential (primary) hypertension: Secondary | ICD-10-CM

## 2017-02-01 DIAGNOSIS — G40909 Epilepsy, unspecified, not intractable, without status epilepticus: Secondary | ICD-10-CM | POA: Diagnosis not present

## 2017-02-01 DIAGNOSIS — Z6834 Body mass index (BMI) 34.0-34.9, adult: Secondary | ICD-10-CM

## 2017-02-01 DIAGNOSIS — H9190 Unspecified hearing loss, unspecified ear: Secondary | ICD-10-CM

## 2017-02-01 DIAGNOSIS — Z86711 Personal history of pulmonary embolism: Secondary | ICD-10-CM

## 2017-02-01 DIAGNOSIS — D509 Iron deficiency anemia, unspecified: Secondary | ICD-10-CM | POA: Diagnosis not present

## 2017-02-01 DIAGNOSIS — I7090 Unspecified atherosclerosis: Secondary | ICD-10-CM | POA: Diagnosis not present

## 2017-02-01 DIAGNOSIS — E1142 Type 2 diabetes mellitus with diabetic polyneuropathy: Secondary | ICD-10-CM | POA: Diagnosis not present

## 2017-02-01 DIAGNOSIS — Z85110 Personal history of malignant carcinoid tumor of bronchus and lung: Secondary | ICD-10-CM

## 2017-02-01 DIAGNOSIS — I5030 Unspecified diastolic (congestive) heart failure: Secondary | ICD-10-CM

## 2017-02-01 DIAGNOSIS — E1159 Type 2 diabetes mellitus with other circulatory complications: Secondary | ICD-10-CM | POA: Diagnosis not present

## 2017-02-01 DIAGNOSIS — F329 Major depressive disorder, single episode, unspecified: Secondary | ICD-10-CM

## 2017-02-01 DIAGNOSIS — Z23 Encounter for immunization: Secondary | ICD-10-CM

## 2017-02-01 DIAGNOSIS — E785 Hyperlipidemia, unspecified: Secondary | ICD-10-CM | POA: Diagnosis not present

## 2017-02-01 DIAGNOSIS — Z87891 Personal history of nicotine dependence: Secondary | ICD-10-CM

## 2017-02-01 DIAGNOSIS — E1165 Type 2 diabetes mellitus with hyperglycemia: Principal | ICD-10-CM

## 2017-02-01 DIAGNOSIS — E1169 Type 2 diabetes mellitus with other specified complication: Secondary | ICD-10-CM

## 2017-02-01 DIAGNOSIS — Z8673 Personal history of transient ischemic attack (TIA), and cerebral infarction without residual deficits: Secondary | ICD-10-CM

## 2017-02-01 DIAGNOSIS — Z85841 Personal history of malignant neoplasm of brain: Secondary | ICD-10-CM

## 2017-02-01 DIAGNOSIS — Z794 Long term (current) use of insulin: Secondary | ICD-10-CM | POA: Diagnosis not present

## 2017-02-01 DIAGNOSIS — E1140 Type 2 diabetes mellitus with diabetic neuropathy, unspecified: Secondary | ICD-10-CM

## 2017-02-01 DIAGNOSIS — K219 Gastro-esophageal reflux disease without esophagitis: Secondary | ICD-10-CM | POA: Diagnosis not present

## 2017-02-01 DIAGNOSIS — Z79899 Other long term (current) drug therapy: Secondary | ICD-10-CM

## 2017-02-01 DIAGNOSIS — I110 Hypertensive heart disease with heart failure: Secondary | ICD-10-CM

## 2017-02-01 DIAGNOSIS — IMO0002 Reserved for concepts with insufficient information to code with codable children: Secondary | ICD-10-CM

## 2017-02-01 DIAGNOSIS — Z902 Acquired absence of lung [part of]: Secondary | ICD-10-CM

## 2017-02-01 DIAGNOSIS — Z7982 Long term (current) use of aspirin: Secondary | ICD-10-CM

## 2017-02-01 DIAGNOSIS — C3491 Malignant neoplasm of unspecified part of right bronchus or lung: Secondary | ICD-10-CM

## 2017-02-01 DIAGNOSIS — Z9089 Acquired absence of other organs: Secondary | ICD-10-CM

## 2017-02-01 LAB — POCT GLYCOSYLATED HEMOGLOBIN (HGB A1C): Hemoglobin A1C: 11.1

## 2017-02-01 LAB — GLUCOSE, CAPILLARY: GLUCOSE-CAPILLARY: 111 mg/dL — AB (ref 65–99)

## 2017-02-01 MED ORDER — METFORMIN HCL ER (MOD) 1000 MG PO TB24: 1000.0000 mg | ORAL_TABLET | Freq: Two times a day (BID) | ORAL | 2 refills | Status: DC

## 2017-02-01 NOTE — Patient Instructions (Addendum)
FOLLOW-UP INSTRUCTIONS When: 3 months with Dr. Hetty Ely  For: Check up of diabetes  What to bring: blood glucose meter, medications   It was a pleasure to see you today [name]  - For your Diabetes - continue taking the lantus 30 units at night, start taking metformin 1000 mg twice daily ( you were previously taking 500 mg daily ) , bring your meter with you to your follow up visit - I will call you if the results of your anemia labs are abnormal  - For the lung cancer, please call Dr. Worthy Flank office if you would like to discuss the options with him [phone]  - Please call our clinic if you have any problems or questions, we may be able to help you and keep you from a long emergency room wait. Our clinic and after hours phone number is [phone]

## 2017-02-01 NOTE — Progress Notes (Signed)
CC: follow up of diabetes   HPI:  [name] is a 83 y.o. with PMH G1DD, uncontrolled insulin-dependent T2DM w peripheral neuropathy, HTN, HLD, iron deficiency anemia, adenocarcinoma of the lung (stage 1B, +EGFR s/p lobectomy w lymph node dissection), hx of meningioma, seizure disorder, GERD, severe obesity, depression who presents for follow up of iron deficiency anemia, adenocarcinoma, hyperlipidemia, provoked pulmonary embolism, hx of CVA, HTN. Please see the assessment and plans for the status of the patient chronic medical problems.    Past Medical History:  Diagnosis Date  . Adenocarcinoma of lung ([place])     Right upper lobe adenocarcinoma. s/p right lower lobectomy 12/15/10  . Arthritis   . ARTHRITIS, KNEE 03/24/2006  . Brain cancer ([place])    7 cm cerebral right atypical meningoma grade II  . CHF (congestive heart failure) ([place])   . CVA (cerebral infarction) 7-yrs ago  . CVA (cerebrovascular accident) ([place]) 5329    right embolic stroke, no residual deficits  . Depression 12/02/2010  . Diabetes mellitus 2007   HgA1C (02/20/2010) = 9.2, HgA1C (03/20/2009) = 12.1  . DIABETES MELLITUS, TYPE II 2000  . Diverticulitis   . Dysphagia   . GERD 12/04/2005  . Hemangioma of liver 12/02/2010  . HYPERLIPIDEMIA 12/04/2005  . HYPERTENSION 12/04/2005  . Lung mass 07/08/2010  . Meningioma ([place]) 07/21/2010   Review of Systems:  Refer to history of present illness and assessment and plans for pertinent review of systems, all others reviewed and negative   Physical Exam:  Vitals:   02/01/17 1432 02/01/17 1506  BP: (!) 145/62 (!) 124/59  Pulse: 76 72  Temp: 98 F (36.7 C)   TempSrc: Oral   SpO2: 100%   Weight: 202 lb 6.4 oz (91.8 kg)    General: well appearing, no acute distress, difficulty hearing  Cardiac: RRR, no murmur appreciated, no peripheral edema  Pulm: lungs clear to auscultation bilateral  Feet: dry skin, no calluses or sores visible   Assessment & Plan:   Iron  deficiency anemia  Reporting fatigue and leg weakness today but denies bleeding, shortness of breath, dizziness, muscle cramps, or syncope.  Assessment: CBC & iron studies today show low Tsat (15) but with hemoglobin 11.2 and TIBC is on the low normal side so there may be a component of anemia of chronic disease.  -Continue oral iron polysaccharide supplementation   Adenocarcinoma of the right upper lobe  Ms Domek was diagnosed with Stage IB adenocarcinoma of the right upper lobe in October 2012 after having lobectomy with lymph node dissection. Last seen by oncology (Dr. Julien Nordmann) in 2013, at that time CT scan showed no evidence of recurrence or metastatic disease and the plan was follow-up for CT scan with contrast in 6 months. Today she is reporting fatigue and cough but denies shortness of breath, weight loss or night sweats. - encouraged follow up with Dr. Julien Nordmann   Hyperlipidemia  Denies side effects of statin therapy. Hx of CVA and arterial atherosclerosis on CT, last LDL 101 in 2015 with goal LDL 70 or less.  - continue simvastatin 20 mg qd  - repeat lipid panel at follow up   History of provoked pulmonary embolism  Denies sob, hemoptysis. Normal SpO2 today. No calf swelling or tenderness.  Assessment: no signs of thrombosis  Plan:  - continue to monitor   History of CVA  Denies new neuro deficits.  - continue aspirin 81 mg daily and simvastatin 20 mg daily  - continue to  monitor   Hypertension Blood pressure 124/ 59 is below goal today. Denies headaches or orthostasis. Has ankle swelling about once every other month but denies other side effects of medications.  - continue amlodipine- benazepril 4-50 mg daily and carvedilol 25 mg bid  - BMP today   Type 2 DM on insulin with peripheral neuropathy  A1c 11 today, last A1c 7/18 8.0. Did not bring meter today. Denies symptoms of hypoglycemia. Last eye exam 7/18 - no retinopathy. Last urine microalbumin/crt 2010 - 14.2, on ACE- I  therapy.  - continue Lantus 30 units qHS and CBG monitoring  - increase metformin to 1000 mg bid  - foot exam free of ulcers today   Preventative health  - Flu vaccine today   See Encounters Tab for problem based charting.  Patient seen with Dr. Evette Doffing

## 2017-02-02 ENCOUNTER — Encounter: Payer: Self-pay | Admitting: Internal Medicine

## 2017-02-02 LAB — IRON AND TIBC
Iron Saturation: 15 % (ref 15–55)
Iron: 39 ug/dL (ref 27–139)
TIBC: 261 ug/dL (ref 250–450)
UIBC: 222 ug/dL (ref 118–369)

## 2017-02-02 LAB — BMP8+ANION GAP
Anion Gap: 14 mmol/L (ref 10.0–18.0)
BUN / CREAT RATIO: 18 (ref 12–28)
BUN: 18 mg/dL (ref 8–27)
CHLORIDE: 111 mmol/L — AB (ref 96–106)
CO2: 20 mmol/L (ref 20–29)
Calcium: 8.8 mg/dL (ref 8.7–10.3)
Creatinine, Ser: 1.01 mg/dL — ABNORMAL HIGH (ref 0.57–1.00)
GFR calc non Af Amer: 52 mL/min/{1.73_m2} — ABNORMAL LOW (ref >59)
GFR, EST AFRICAN AMERICAN: 60 mL/min/{1.73_m2} (ref >59)
GLUCOSE: 95 mg/dL (ref 65–99)
POTASSIUM: 4.4 mmol/L (ref 3.5–5.2)
Sodium: 145 mmol/L — ABNORMAL HIGH (ref 134–144)

## 2017-02-02 LAB — CBC
Hematocrit: 36.7 % (ref 34.0–46.6)
Hemoglobin: 11.2 g/dL (ref 11.1–15.9)
MCH: 24.5 pg — ABNORMAL LOW (ref 26.6–33.0)
MCHC: 30.5 g/dL — AB (ref 31.5–35.7)
MCV: 80 fL (ref 79–97)
Platelets: 305 10*3/uL (ref 150–379)
RBC: 4.57 x10E6/uL (ref 3.77–5.28)
RDW: 15.5 % — ABNORMAL HIGH (ref 12.3–15.4)
WBC: 5.7 10*3/uL (ref 3.4–10.8)

## 2017-02-02 NOTE — Assessment & Plan Note (Signed)
Denies new neuro deficits.  - continue aspirin 81 mg daily and simvastatin 20 mg daily  - continue to monitor

## 2017-02-02 NOTE — Assessment & Plan Note (Signed)
Denies side effects of statin therapy. Hx of CVA and arterial atherosclerosis on CT, last LDL 101 in 2015 with goal LDL 70 or less.  - continue simvastatin 20 mg qd  - repeat lipid panel at follow up

## 2017-02-02 NOTE — Assessment & Plan Note (Signed)
Reporting fatigue and leg weakness today but denies bleeding, shortness of breath, dizziness, muscle cramps, or syncope.  Assessment: CBC & iron studies today show low Tsat (15) but with hemoglobin 11.2 and TIBC is on the low normal side so there may be a component of anemia of chronic disease.  -Continue oral iron polysaccharide supplementation

## 2017-02-02 NOTE — Assessment & Plan Note (Signed)
Blood pressure 124/ 59 is below goal today. Denies headaches or orthostasis. Has ankle swelling about once every other month but denies other side effects of medications.  - continue amlodipine- benazepril 4-50 mg daily and carvedilol 25 mg bid  - BMP today

## 2017-02-02 NOTE — Assessment & Plan Note (Signed)
[name] was diagnosed with Stage IB adenocarcinoma of the right upper lobe in October 2012 after having lobectomy with lymph node dissection. Last seen by oncology (Dr. Julien Nordmann) in 2013, at that time CT scan showed no evidence of recurrence or metastatic disease and the plan was follow-up for CT scan with contrast in 6 months. Today she is reporting fatigue and cough but denies shortness of breath, weight loss or night sweats. - encouraged follow up with Dr. Julien Nordmann

## 2017-02-02 NOTE — Assessment & Plan Note (Signed)
A1c 11 today, last A1c 7/18 8.0. Did not bring meter today. Denies symptoms of hypoglycemia. Last eye exam 7/18 - no retinopathy. Last urine microalbumin/crt 2010 - 14.2, on ACE- I therapy.  - continue Lantus 30 units qHS and CBG monitoring  - increase metformin to 1000 mg bid  - foot exam free of ulcers today

## 2017-02-02 NOTE — Assessment & Plan Note (Signed)
Denies sob, hemoptysis. Normal SpO2 today. No calf swelling or tenderness.  Assessment: no signs of thrombosis  Plan:  - continue to monitor

## 2017-02-03 NOTE — Progress Notes (Signed)
Internal Medicine Clinic Attending  Case discussed with Dr. Blum at the time of the visit.  We reviewed the resident's history and exam and pertinent patient test results.  I agree with the assessment, diagnosis, and plan of care documented in the resident's note. 

## 2017-02-13 IMAGING — US US RENAL
1 series · 14 of 25 positions shown · non-contrast
Comparison: CT abdomen and pelvis 10/08/2014

CLINICAL DATA: Urinary tract infection.  Fever.

EXAM:
RENAL / URINARY TRACT ULTRASOUND COMPLETE

[Series 1: us renal · 0.21mm/px · 14 of 30 slices shown]
[im 1/30]
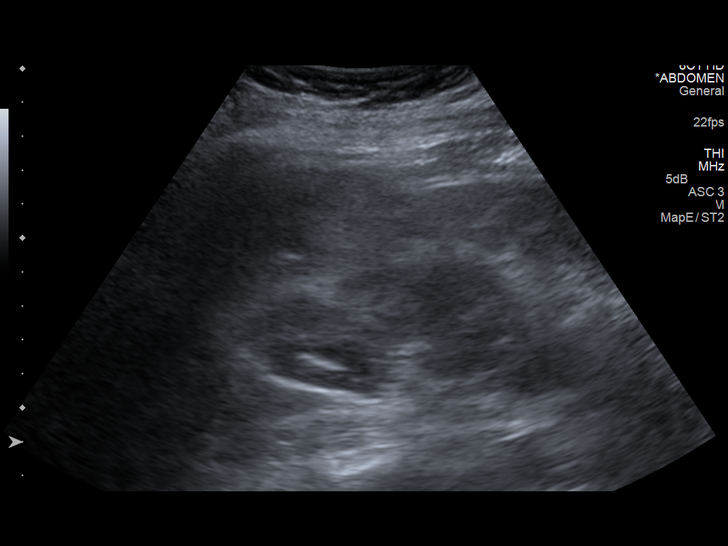
[im 3/30]
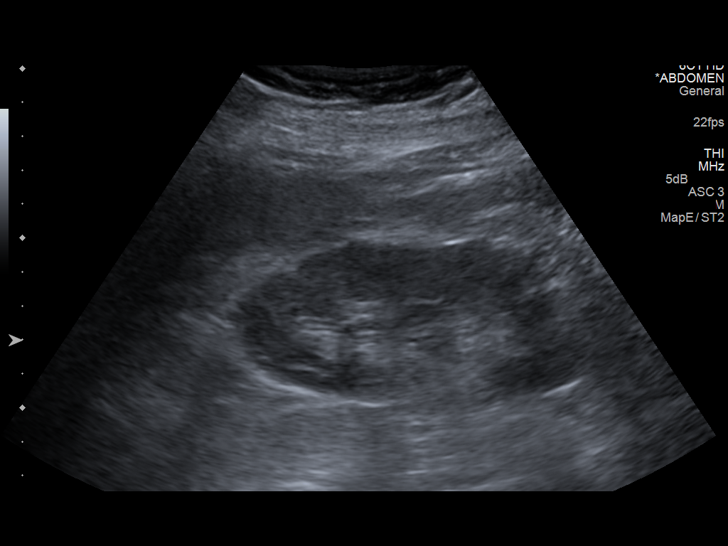
[im 5/30]
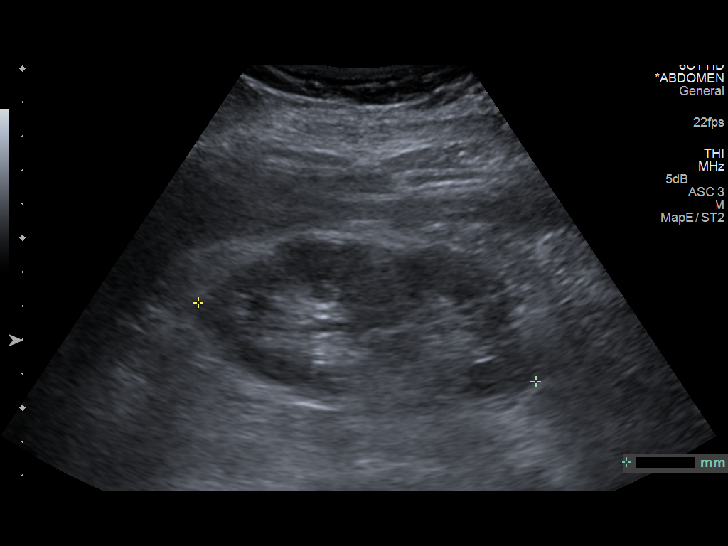
[im 8/30]
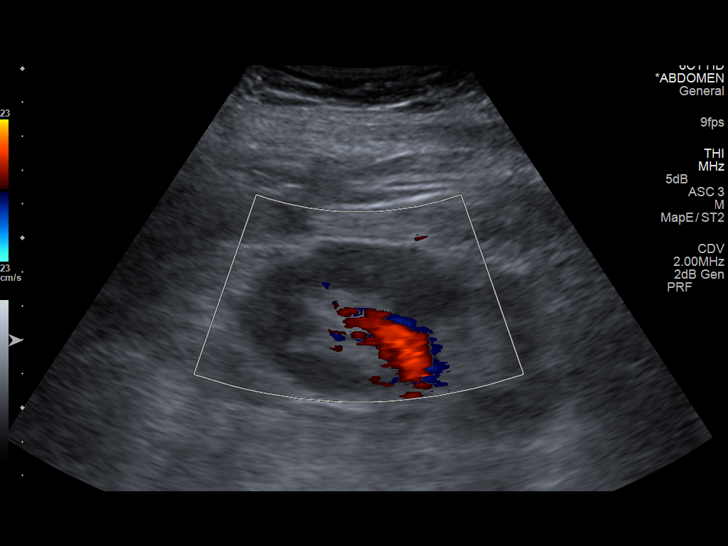
[im 10/30]
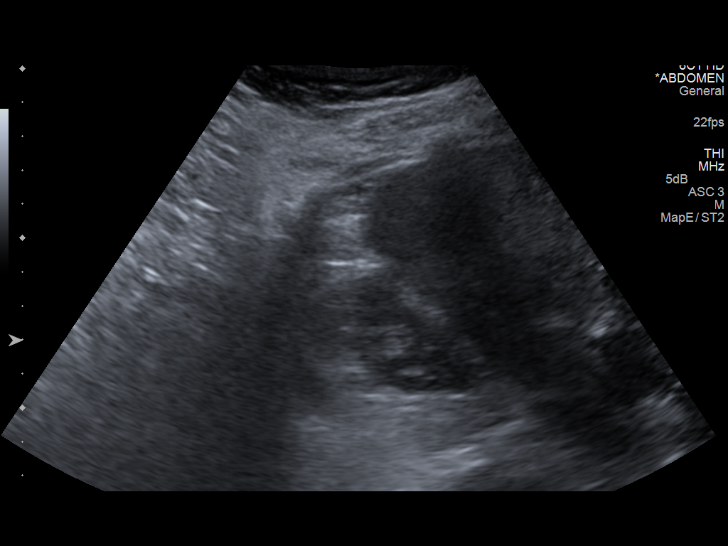
[im 11/30]
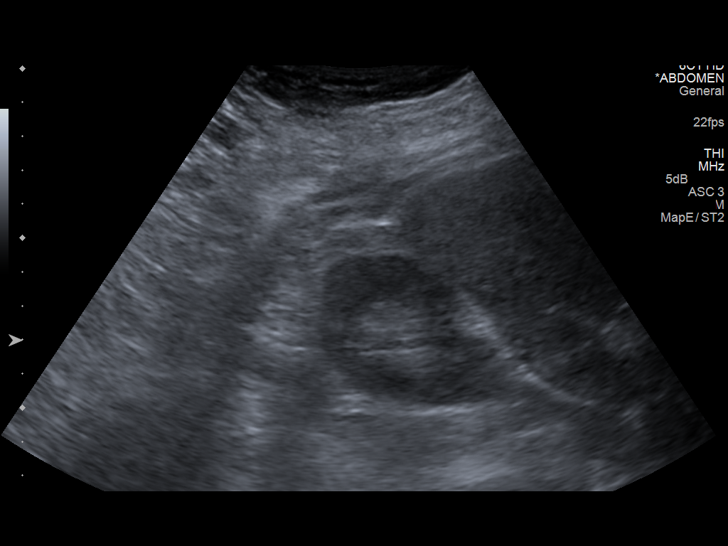
[im 14/30]
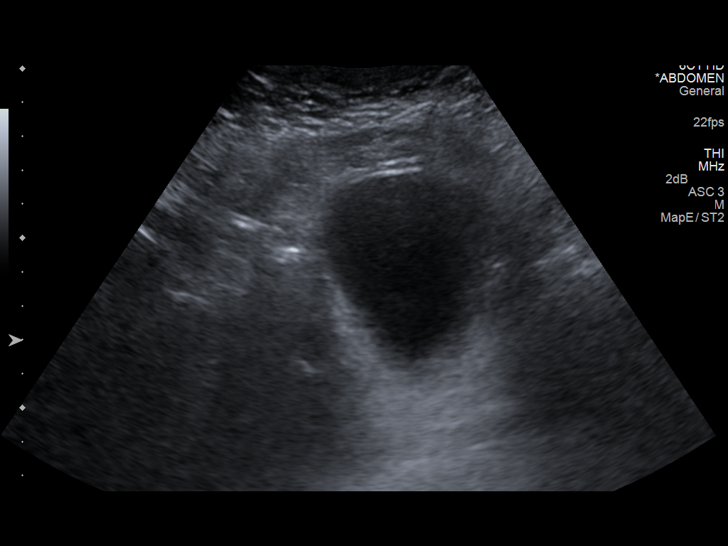
[im 16/30]
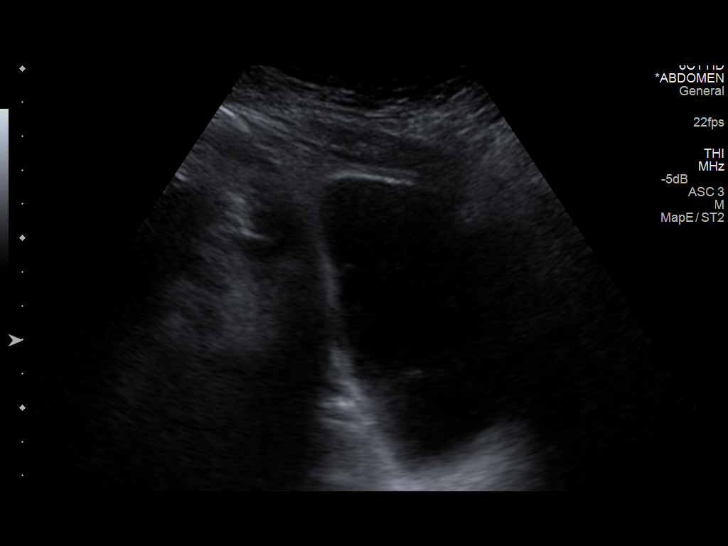
[im 19/30]
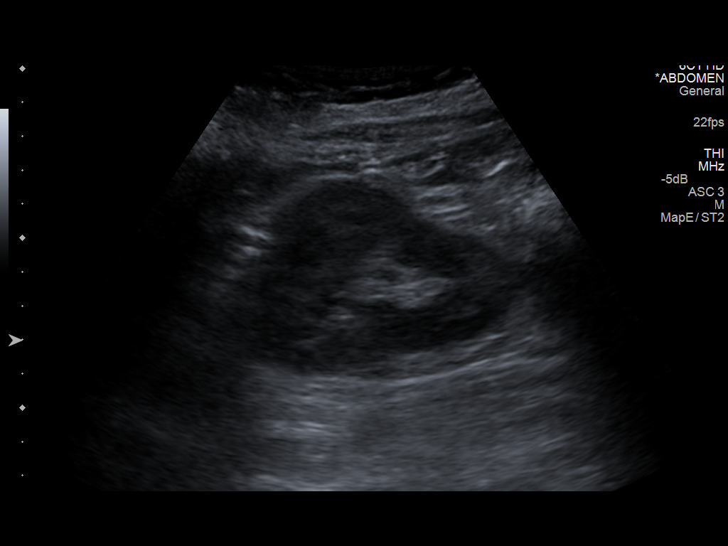
[im 20/30]
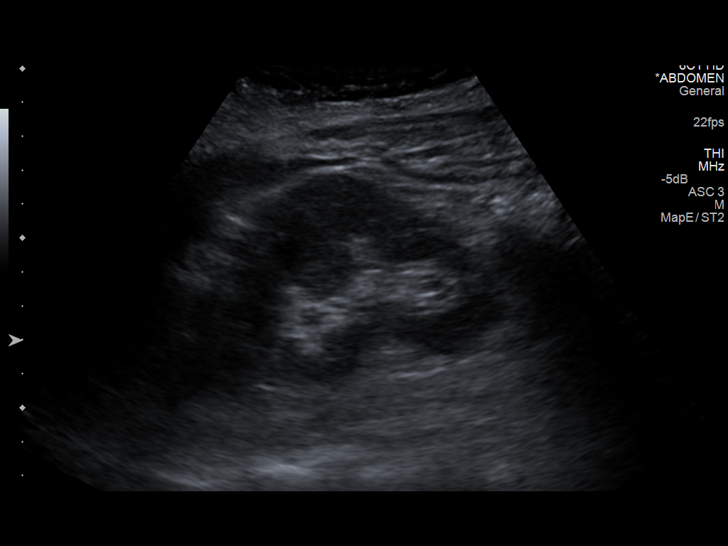
[im 22/30]
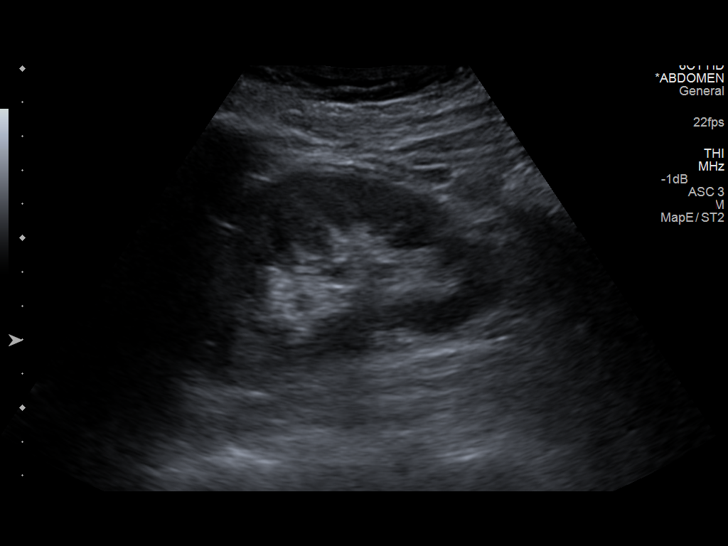
[im 25/30]
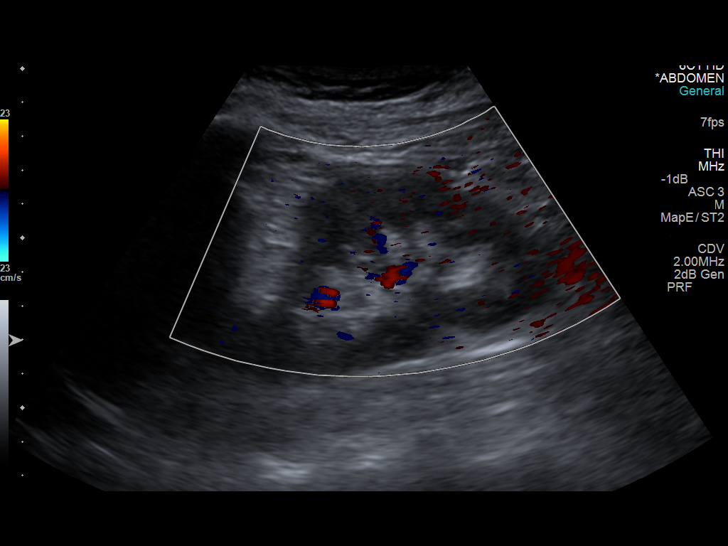
[im 27/30]
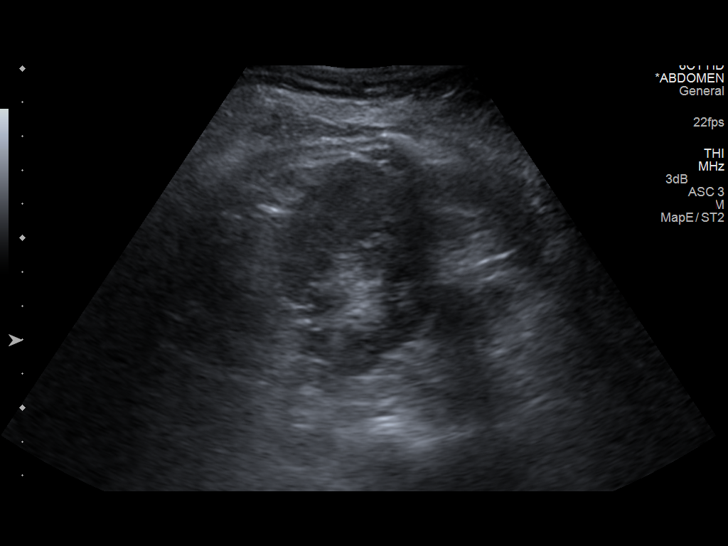
[im 30/30]
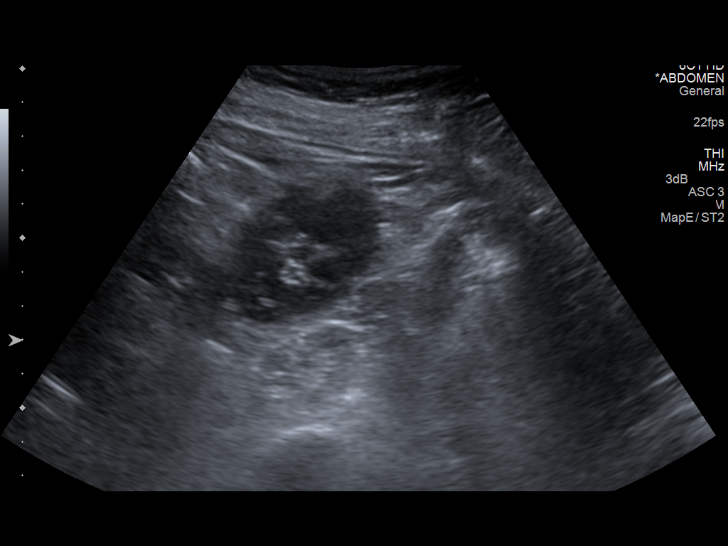

[14 of 25 positions shown; findings below may reference images not displayed]

FINDINGS: Right Kidney:

Length: 10.4 cm. Echogenicity within normal limits. No mass or
hydronephrosis visualized.

Left Kidney:

Length: 10.1 cm. Echogenicity within normal limits. No mass or
hydronephrosis visualized.

Bladder:

Appears normal for degree of bladder distention.
IMPRESSION: Normal ultrasound appearance of the kidneys and bladder.

## 2017-03-24 ENCOUNTER — Other Ambulatory Visit: Payer: Self-pay | Admitting: Internal Medicine

## 2017-03-24 DIAGNOSIS — I10 Essential (primary) hypertension: Secondary | ICD-10-CM

## 2017-03-25 ENCOUNTER — Other Ambulatory Visit: Payer: Self-pay | Admitting: Internal Medicine

## 2017-03-25 DIAGNOSIS — E7800 Pure hypercholesterolemia, unspecified: Secondary | ICD-10-CM

## 2017-03-25 DIAGNOSIS — I10 Essential (primary) hypertension: Secondary | ICD-10-CM

## 2017-03-25 NOTE — Telephone Encounter (Signed)
Next appt scheduled  05/25/17 with PCP.

## 2017-04-08 ENCOUNTER — Other Ambulatory Visit: Payer: Self-pay | Admitting: Internal Medicine

## 2017-04-08 DIAGNOSIS — G40909 Epilepsy, unspecified, not intractable, without status epilepticus: Secondary | ICD-10-CM

## 2017-04-08 NOTE — Telephone Encounter (Signed)
Next appt scheduled  4/9 with PCP.

## 2017-04-17 ENCOUNTER — Other Ambulatory Visit: Payer: Self-pay | Admitting: Internal Medicine

## 2017-04-17 DIAGNOSIS — K219 Gastro-esophageal reflux disease without esophagitis: Secondary | ICD-10-CM

## 2017-04-19 ENCOUNTER — Telehealth: Payer: Self-pay | Admitting: *Deleted

## 2017-04-19 MED ORDER — INSULIN PEN NEEDLE 31G X 8 MM MISC: 11 refills | Status: DC

## 2017-04-19 NOTE — Telephone Encounter (Signed)
I've sent in a prescription for ultra fine II needles to [facility] on [address]

## 2017-04-19 NOTE — Telephone Encounter (Signed)
Fax from [place] - needs rx for Ultar fine pen needles 8 MM x 31 G - "use to administer Liraglutide and lantus Qty# 100 Dx code E11.9" Thanks.

## 2017-04-26 ENCOUNTER — Telehealth: Payer: Self-pay | Admitting: *Deleted

## 2017-04-26 DIAGNOSIS — Z794 Long term (current) use of insulin: Principal | ICD-10-CM

## 2017-04-26 DIAGNOSIS — E1165 Type 2 diabetes mellitus with hyperglycemia: Principal | ICD-10-CM

## 2017-04-26 DIAGNOSIS — E1140 Type 2 diabetes mellitus with diabetic neuropathy, unspecified: Secondary | ICD-10-CM

## 2017-04-26 DIAGNOSIS — IMO0002 Reserved for concepts with insufficient information to code with codable children: Secondary | ICD-10-CM

## 2017-04-26 MED ORDER — METFORMIN HCL ER 500 MG PO TB24
1000.0000 mg | ORAL_TABLET | Freq: Two times a day (BID) | ORAL | 3 refills | Status: DC
Start: 2017-04-26 — End: 2017-11-23

## 2017-04-26 NOTE — Telephone Encounter (Signed)
Glucophage XR 1000 mg tablets not orderable through our EMR for some reason.  Therefore prescribed 500 mg tablets at 2 tablets BID.

## 2017-04-26 NOTE — Telephone Encounter (Signed)
Received call from [employer] (insurance) stating metformin (glumetza) 1000mg  24hr tablets are non-formulary.  Insurance is requesting MD to change rx to glucophage xr 1000mg .  Will send request to pcp for review, please advise.[name]3/11/20192:17 PM

## 2017-05-24 ENCOUNTER — Telehealth: Payer: Self-pay | Admitting: *Deleted

## 2017-05-24 NOTE — Telephone Encounter (Signed)
Received call from Alric Quan, NP with [facility] [phone]) reporting patient's ABIs 0.28 on right and 0.3 on left. Also A1c 9.6 which has come down from 11 on 02/01/2017. Pt taking lantus as instructed but is not taking metformin as it gives her loose stools. Patient had appt with PCP tomorrow but cancelled. Offered to reschedule but patient needs to know daughter's schedule. Will have daughter call back to schedule. Hubbard Hartshorn, RN, BSN

## 2017-05-25 ENCOUNTER — Encounter: Payer: Medicare Other | Admitting: Internal Medicine

## 2017-05-25 NOTE — Telephone Encounter (Signed)
Thank you, I agree with need to schedule appointment. Can she participate in a walking program for arterial insufficiency?

## 2017-06-01 NOTE — Telephone Encounter (Signed)
Spoke with daughter and patient scheduled in [facility] on 06/10/2017 to discuss abnormal ABIs. Daughter states patient walks around home but is always complaining legs hurt. Hubbard Hartshorn, RN, BSN

## 2017-06-10 ENCOUNTER — Encounter: Payer: Self-pay | Admitting: Internal Medicine

## 2017-06-10 ENCOUNTER — Ambulatory Visit (INDEPENDENT_AMBULATORY_CARE_PROVIDER_SITE_OTHER): Payer: Medicare Other | Admitting: Internal Medicine

## 2017-06-10 DIAGNOSIS — Z79899 Other long term (current) drug therapy: Secondary | ICD-10-CM | POA: Diagnosis not present

## 2017-06-10 DIAGNOSIS — I739 Peripheral vascular disease, unspecified: Secondary | ICD-10-CM

## 2017-06-10 DIAGNOSIS — Z79891 Long term (current) use of opiate analgesic: Secondary | ICD-10-CM | POA: Diagnosis not present

## 2017-06-10 DIAGNOSIS — Z7982 Long term (current) use of aspirin: Secondary | ICD-10-CM

## 2017-06-10 MED ORDER — ACETAMINOPHEN-CODEINE #3 300-30 MG PO TABS: 1.0000 | ORAL_TABLET | Freq: Every day | ORAL | 0 refills | Status: DC | PRN

## 2017-06-10 MED ORDER — CILOSTAZOL 100 MG PO TABS: 100.0000 mg | ORAL_TABLET | Freq: Two times a day (BID) | ORAL | 2 refills | Status: DC

## 2017-06-10 NOTE — Patient Instructions (Addendum)
FOLLOW-UP INSTRUCTIONS When: 1 month For: PCP follow up What to bring: Medications   [name],  It was a pleasure to see you. I am sorry to hear about your leg pain. I have started you on a medicine called cilostazol. Please take this twice a day 30 minutes prior to a meal. This should help with your leg cramping symptoms. I have also refilled your tylenol #3. Please take this sparingly only when pain is severe and unrelieved with cilostazol. Follow up with your PCP in 1 month. If you have any questions or concerns, call our clinic at [phone] or after hours call [phone] and ask for the internal medicine resident on call. Thank you!  - Dr. Philipp Ovens

## 2017-06-10 NOTE — Progress Notes (Signed)
   CC: Leg pain  HPI:  [name] is a 84 y.o. female with past medical history outlined below here for bilateral leg pain. For the details of today's visit, please refer to the assessment and plan.  Past Medical History:  Diagnosis Date  . Adenocarcinoma of lung ([place])     Right upper lobe adenocarcinoma. s/p right lower lobectomy 12/15/10  . Arthritis   . ARTHRITIS, KNEE 03/24/2006  . Brain cancer ([place])    7 cm cerebral right atypical meningoma grade II  . CHF (congestive heart failure) ([place])   . CVA (cerebral infarction) 7-yrs ago  . CVA (cerebrovascular accident) ([place]) 6568    right embolic stroke, no residual deficits  . Depression 12/02/2010  . Diabetes mellitus 2007   HgA1C (02/20/2010) = 9.2, HgA1C (03/20/2009) = 12.1  . DIABETES MELLITUS, TYPE II 2000  . Diverticulitis   . Dysphagia   . GERD 12/04/2005  . Hemangioma of liver 12/02/2010  . HYPERLIPIDEMIA 12/04/2005  . HYPERTENSION 12/04/2005  . Lung mass 07/08/2010  . Meningioma ([place]) 07/21/2010    Review of Systems  Musculoskeletal: Negative for falls.       Bilateral leg pain    Physical Exam:  Vitals:   06/10/17 1321  BP: (!) 115/55  Pulse: 71  Temp: 98.1 F (36.7 C)  TempSrc: Oral  SpO2: 99%  Weight: 201 lb 14.4 oz (91.6 kg)  Height: 5\' 4"  (1.626 m)    Constitutional: NAD, appears comfortable Cardiovascular: RRR, no murmurs, rubs, or gallops.  Pulmonary/Chest: CTAB, no wheezes, rales, or rhonchi.  Extremities: Extremities slightly cool to touch, faint DP pulses bilaterally.  Psychiatric: Normal mood and affect  Assessment & Plan:   See Encounters Tab for problem based charting.  Patient discussed with Dr. Beryle Beams

## 2017-06-10 NOTE — Assessment & Plan Note (Addendum)
Patient is here with complaint of bilateral calf pain worse with exertion, and improves with resting and leg elevation. This has been on going for years but progressed. ABIs today in clinic were reported by our machine as severe, unable to calculate due to abnormally weak pulses. Her claudication symptoms sound stable and she has faint but palpable DP pulses bilaterally.  -- [facility] referral  -- Start cilostazol 100 mg BID -- Continue ASA and simvastatin  -- Refilled Tylenol #3 for severe pain; instructed to take sparingly

## 2017-06-10 NOTE — Addendum Note (Signed)
Addended by: Jodean Lima on: 06/10/2017 04:16 PM   Modules accepted: Orders

## 2017-06-10 NOTE — Progress Notes (Signed)
Medicine attending: Medical history, presenting problems, physical findings, and medications, reviewed with resident physician Dr Armstead Peaks on the day of the patient visit and I concur with her evaluation and management plan.

## 2017-06-21 ENCOUNTER — Other Ambulatory Visit: Payer: Self-pay

## 2017-06-21 MED ORDER — INSULIN GLARGINE 100 UNIT/ML SOLOSTAR PEN: PEN_INJECTOR | SUBCUTANEOUS | 4 refills | Status: DC

## 2017-06-22 ENCOUNTER — Encounter: Payer: Self-pay | Admitting: *Deleted

## 2017-07-02 ENCOUNTER — Other Ambulatory Visit: Payer: Self-pay | Admitting: Internal Medicine

## 2017-07-02 DIAGNOSIS — I739 Peripheral vascular disease, unspecified: Secondary | ICD-10-CM

## 2017-07-08 ENCOUNTER — Ambulatory Visit (INDEPENDENT_AMBULATORY_CARE_PROVIDER_SITE_OTHER): Payer: Medicare Other | Admitting: Internal Medicine

## 2017-07-08 ENCOUNTER — Other Ambulatory Visit: Payer: Self-pay | Admitting: Internal Medicine

## 2017-07-08 ENCOUNTER — Encounter: Payer: Self-pay | Admitting: Internal Medicine

## 2017-07-08 ENCOUNTER — Other Ambulatory Visit: Payer: Self-pay

## 2017-07-08 VITALS — BP 131/57 | HR 93 | Temp 98.3°F | Ht 64.0 in | Wt 204.0 lb

## 2017-07-08 DIAGNOSIS — Z7982 Long term (current) use of aspirin: Secondary | ICD-10-CM

## 2017-07-08 DIAGNOSIS — R32 Unspecified urinary incontinence: Secondary | ICD-10-CM

## 2017-07-08 DIAGNOSIS — I739 Peripheral vascular disease, unspecified: Secondary | ICD-10-CM

## 2017-07-08 DIAGNOSIS — Z79891 Long term (current) use of opiate analgesic: Secondary | ICD-10-CM

## 2017-07-08 DIAGNOSIS — Z79899 Other long term (current) drug therapy: Secondary | ICD-10-CM

## 2017-07-08 NOTE — Telephone Encounter (Signed)
Patient calling about pain medicine

## 2017-07-08 NOTE — Patient Instructions (Signed)
I strongly recommend attending the vascular surgery appointment on June 11.  You have poor circulation in both feet contributing to pain with activity.  You can keep taking the cilostazol (Pletal) 100 mg twice a day for these symptoms.  The vascular surgeon can do a better test to find out exactly where and how bad the blockages are and whether they would benefit from some different treatment.

## 2017-07-08 NOTE — Progress Notes (Signed)
   CC: Follow up for claudication and abnormal ABIs  HPI:  [name] is a 84 y.o. female with PMHx detailed below presenting for 1 month follow up of claudication pain after starting cilostazol 100mg  BID. She thinks this medicine is slightly improving her leg pain in the back of both calves but still describes it as 8 out of 10 in severity provoked by walking very short distances even just from walking to the bathroom and then back inside the house.  The pain is relieved after resting for 5 to 10 minutes.  She was referred to vascular surgery at her last visit but has not arranged this yet because her daughter does not answer phone calls during the workday and is the one who schedules her medical care.  See problem based assessment and plan below for additional details.  PAD (peripheral artery disease) ([place]) She continues to describe what sounds like stable claudication symptoms but provoked with just moving from room to room at home.  She think starting Pletal is partially beneficial but continues to use Tylenol #3 as initial medicine for the pain.  She is not yet been seen for a full assessment after the abnormal ABIs due to difficulty getting in touch with her daughter who cannot receive calls at work. Plan: Appointment scheduled with Dr. Reece Levy during office visit today Continue cilostazol 100 mg twice daily Continue aspirin Reorder Tylenol 3 x30 tablets  Incontinence of urine She has chronic urinary incontinence for several years for which she wears adult pull-ups and pads.  She is ambulatory and able to manage these products on her own and sometimes with daughter's assistance.  We will place a new order today for continuing her supplies for an indefinite duration.   Past Medical History:  Diagnosis Date  . Adenocarcinoma of lung ([place])     Right upper lobe adenocarcinoma. s/p right lower lobectomy 12/15/10  . Arthritis   . ARTHRITIS, KNEE 03/24/2006  . Brain cancer ([place])    7  cm cerebral right atypical meningoma grade II  . CHF (congestive heart failure) ([place])   . CVA (cerebral infarction) 7-yrs ago  . CVA (cerebrovascular accident) ([place]) 5701    right embolic stroke, no residual deficits  . Depression 12/02/2010  . Diabetes mellitus 2007   HgA1C (02/20/2010) = 9.2, HgA1C (03/20/2009) = 12.1  . DIABETES MELLITUS, TYPE II 2000  . Diverticulitis   . Dysphagia   . GERD 12/04/2005  . Hemangioma of liver 12/02/2010  . HYPERLIPIDEMIA 12/04/2005  . HYPERTENSION 12/04/2005  . Lung mass 07/08/2010  . Meningioma ([place]) 07/21/2010    Review of Systems: Review of Systems  Cardiovascular: Positive for claudication. Negative for leg swelling.  Musculoskeletal: Negative for falls.  Neurological: Negative for focal weakness.     Physical Exam: Vitals:   07/08/17 1318  BP: (!) 131/57  Pulse: 93  Temp: 98.3 F (36.8 C)  TempSrc: Oral  SpO2: 95%  Weight: 204 lb (92.5 kg)  Height: 5\' 4"  (1.626 m)   GENERAL- alert, co-operative, NAD CARDIAC- RRR, no murmurs, rubs or gallops. NEURO-sensation is grossly intact throughout both feet EXTREMITIES-nonpalpable dorsalis pedis bilaterally, waxy hairless skin changes without any discoloration or ulcers on either foot, no calf swelling or tenderness to palpation SKIN- Warm, dry, No rash or lesion. PSYCH- Normal mood and affect, appropriate thought content and speech.   Assessment & Plan:   See encounters tab for problem based medical decision making.   Patient discussed with Dr. Beryle Beams

## 2017-07-09 MED ORDER — ACETAMINOPHEN-CODEINE #3 300-30 MG PO TABS: 1.0000 | ORAL_TABLET | Freq: Every day | ORAL | 0 refills | Status: DC | PRN

## 2017-07-09 NOTE — Telephone Encounter (Signed)
Pt's daughter calls today and states dr rice promised to send her ty#3 in and di not and still has not, she wants it ASAP

## 2017-07-09 NOTE — Telephone Encounter (Signed)
I sent an order now. I did not refill it immediately at the visit due to not having been the recommended 30 days from Dr. Fredrik Cove previous Rx.

## 2017-07-13 NOTE — Assessment & Plan Note (Signed)
She continues to describe what sounds like stable claudication symptoms but provoked with just moving from room to room at home.  She think starting Pletal is partially beneficial but continues to use Tylenol #3 as initial medicine for the pain.  She is not yet been seen for a full assessment after the abnormal ABIs due to difficulty getting in touch with her daughter who cannot receive calls at work. Plan: Appointment scheduled with Dr. Reece Levy during office visit today Continue cilostazol 100 mg twice daily Continue aspirin Reorder Tylenol 3 x30 tablets

## 2017-07-13 NOTE — Assessment & Plan Note (Signed)
She has chronic urinary incontinence for several years for which she wears adult pull-ups and pads.  She is ambulatory and able to manage these products on her own and sometimes with daughter's assistance.  We will place a new order today for continuing her supplies for an indefinite duration.

## 2017-07-13 NOTE — Progress Notes (Signed)
Medicine attending: Medical history, presenting problems, physical findings, and medications, reviewed with resident physician Dr Vernelle Emerald on the day of the patient visit and I concur with his evaluation and management plan.

## 2017-07-18 ENCOUNTER — Other Ambulatory Visit: Payer: Self-pay | Admitting: Internal Medicine

## 2017-07-27 ENCOUNTER — Ambulatory Visit (INDEPENDENT_AMBULATORY_CARE_PROVIDER_SITE_OTHER): Payer: Medicare Other | Admitting: Cardiovascular Disease

## 2017-07-27 ENCOUNTER — Encounter: Payer: Self-pay | Admitting: Cardiovascular Disease

## 2017-07-27 VITALS — BP 128/62 | HR 98 | Ht 64.0 in | Wt 201.0 lb

## 2017-07-27 DIAGNOSIS — E1159 Type 2 diabetes mellitus with other circulatory complications: Secondary | ICD-10-CM | POA: Diagnosis not present

## 2017-07-27 DIAGNOSIS — E1169 Type 2 diabetes mellitus with other specified complication: Secondary | ICD-10-CM | POA: Diagnosis not present

## 2017-07-27 DIAGNOSIS — I10 Essential (primary) hypertension: Secondary | ICD-10-CM | POA: Diagnosis not present

## 2017-07-27 DIAGNOSIS — E785 Hyperlipidemia, unspecified: Secondary | ICD-10-CM

## 2017-07-27 DIAGNOSIS — R0989 Other specified symptoms and signs involving the circulatory and respiratory systems: Secondary | ICD-10-CM | POA: Diagnosis not present

## 2017-07-27 DIAGNOSIS — I739 Peripheral vascular disease, unspecified: Secondary | ICD-10-CM | POA: Diagnosis not present

## 2017-07-27 DIAGNOSIS — I152 Hypertension secondary to endocrine disorders: Secondary | ICD-10-CM

## 2017-07-27 NOTE — Assessment & Plan Note (Signed)
History of essential hypertension her blood pressure measured at 128/62.  She is on amlodipine and benazepril.  Current meds at current dosing.

## 2017-07-27 NOTE — Patient Instructions (Addendum)
Medication Instructions:  Your physician recommends that you continue on your current medications as directed. Please refer to the Current Medication list given to you today.  Labwork: None   Testing/Procedures: Your physician has requested that you have a lower extremity arterial duplex. This test is an ultrasound of the arteries in the legs . It looks at arterial blood flow in the legs. Allow one hour for Lower and Upper Arterial scans. There are no restrictions or special instructions  Your physician has requested that you have a carotid duplex. This test is an ultrasound of the carotid arteries in your neck. It looks at blood flow through these arteries that supply the brain with blood. Allow one hour for this exam. There are no restrictions or special instructions.   Follow-Up: Your physician recommends that you schedule a follow-up appointment in: after testing with Dr Gwenlyn Found  Any Other Special Instructions Will Be Listed Below (If Applicable).     If you need a refill on your cardiac medications before your next appointment, please call your pharmacy.

## 2017-07-27 NOTE — Progress Notes (Signed)
07/27/2017 [name]   06-26-1933  [id number]  Primary Physician [name] Noss, MD Primary Cardiologist: [name] Harp MD [name], [name], [name]  HPI:  [name] is a 84 y.o. moderately overweight widowed African-American female mother of 5 children is accompanied by her daughter [name].  She was referred by Dr. Fuller Burns for peripheral vascular evaluation because of lifestyle limiting claudication and abnormal segmental pressures.  She does have a history of treated hypertension, diabetes and hyperlipidemia.  She had a remote CVA in 2010.  She denies chest pain or shortness of breath.  She lives with her daughter.  She walks with a cane and/or walker and has some calf claudication.   Current Meds  Medication Sig  . ACCU-CHEK FASTCLIX LANCETS MISC Check blood sugar three times a day  . acetaminophen-codeine (TYLENOL #3) 300-30 MG tablet Take 1 tablet by mouth daily as needed for moderate pain or severe pain.  Marland Kitchen amLODipine-benazepril (LOTREL) 5-40 MG capsule TAKE 1 CAPSULE BY MOUTH EVERY DAY  . aspirin EC 81 MG tablet Take 81 mg by mouth daily.  . Blood Glucose Monitoring Suppl (ACCU-CHEK GUIDE) w/Device KIT 1 each by Does not apply route 3 (three) times daily.  . carvedilol (COREG) 25 MG tablet TAKE 1 TABLET BY MOUTH 2 TIMES DAILY WITH A MEAL.  . cilostazol (PLETAL) 100 MG tablet TAKE 1 TABLET BY MOUTH TWICE A DAY  . diclofenac sodium (VOLTAREN) 1 % GEL Apply 4 g topically 4 (four) times daily as needed (pain).  . feeding supplement, GLUCERNA SHAKE, (GLUCERNA SHAKE) LIQD Take 237 mLs by mouth 2 (two) times daily between meals.  Marland Kitchen FLUoxetine (PROZAC) 10 MG capsule TAKE 1 CAPSULE (10 MG TOTAL) BY MOUTH DAILY.  Marland Kitchen glucose blood (ACCU-CHEK GUIDE) test strip Check blood sugar three times a day  . Incontinence Supplies MISC For use to address bowel and bladder incontinence.  . Insulin Glargine (LANTUS SOLOSTAR) 100 UNIT/ML Solostar Pen Inject 30 units at bedtime  . Insulin Pen Needle  31G X 8 MM MISC Use to administer lantus  . iron polysaccharides (FERREX 150) 150 MG capsule TAKE 1 CAPSULE BY MOUTH EVERY DAY  . levETIRAcetam (KEPPRA) 750 MG tablet TAKE 1 TABLET (750 MG TOTAL) BY MOUTH 2 (TWO) TIMES DAILY.  . metFORMIN (GLUCOPHAGE-XR) 500 MG 24 hr tablet Take 2 tablets (1,000 mg total) by mouth 2 (two) times daily.  Marland Kitchen omeprazole (PRILOSEC) 20 MG capsule (01/30/16) TAKE 1 CAPSULE BY MOUTH DAILY.  . simvastatin (ZOCOR) 20 MG tablet TAKE 1 TABLET BY MOUTH DAILY.     No Known Allergies  Social History   Socioeconomic History  . Marital status: Widowed    Spouse name: Not on file  . Number of children: 5  . Years of education: 10  . Highest education level: Not on file  Occupational History    Employer: UNEMPLOYED  Social Needs  . Financial resource strain: Not on file  . Food insecurity:    Worry: Not on file    Inability: Not on file  . Transportation needs:    Medical: Not on file    Non-medical: Not on file  Tobacco Use  . Smoking status: Former Smoker    Types: Cigarettes    Last attempt to quit: 02/17/2000    Years since quitting: 17.4  . Smokeless tobacco: Never Used  Substance and Sexual Activity  . Alcohol use: No    Alcohol/week: 0.0 oz  . Drug use: No  .  Sexual activity: Not Currently  Lifestyle  . Physical activity:    Days per week: Not on file    Minutes per session: Not on file  . Stress: Not on file  Relationships  . Social connections:    Talks on phone: Not on file    Gets together: Not on file    Attends religious service: Not on file    Active member of club or organization: Not on file    Attends meetings of clubs or organizations: Not on file    Relationship status: Not on file  . Intimate partner violence:    Fear of current or ex partner: Not on file    Emotionally abused: Not on file    Physically abused: Not on file    Forced sexual activity: Not on file  Other Topics Concern  . Not on file  Social History Narrative    Patient requests to use [employer] for her diabetes testing supplies as of 09/05/2009.   Lives at home with daughter, [name].   Caffeine use: 1 cup coffee/day     Review of Systems: General: negative for chills, fever, night sweats or weight changes.  Cardiovascular: negative for chest pain, dyspnea on exertion, edema, orthopnea, palpitations, paroxysmal nocturnal dyspnea or shortness of breath Dermatological: negative for rash Respiratory: negative for cough or wheezing Urologic: negative for hematuria Abdominal: negative for nausea, vomiting, diarrhea, bright red blood per rectum, melena, or hematemesis Neurologic: negative for visual changes, syncope, or dizziness All other systems reviewed and are otherwise negative except as noted above.    Blood pressure 128/62, pulse 98, height '5\' 4"'  (1.626 m), weight 201 lb (91.2 kg).  General appearance: alert and no distress Neck: no adenopathy, no JVD, supple, symmetrical, trachea midline, thyroid not enlarged, symmetric, no tenderness/mass/nodules and Soft right carotid bruit Lungs: clear to auscultation bilaterally Heart: regular rate and rhythm, S1, S2 normal, no murmur, click, rub or gallop Extremities: extremities normal, atraumatic, no cyanosis or edema Pulses: 2+ and symmetric Skin: Skin color, texture, turgor normal. No rashes or lesions Neurologic: Alert and oriented X 3, normal strength and tone. Normal symmetric reflexes. Normal coordination and gait  EKG sinus rhythm at 98 a nonspecific IVCD.  I personally reviewed this EKG  ASSESSMENT AND PLAN:   PAD (peripheral artery disease) ([name]) [name] referred to me by Dr. Hetty Ely for symptomatic PAD.  She had segmental pressures clinic 06/10/2017.  She has typical medication but really does not walk that much.  She says that she comes back from the bathroom her calves hurt.  I am going to get lower extremity arterial Doppler studies.  Hyperlipidemia associated with type  2 diabetes mellitus ([name]) History of hyperlipidemia on statin therapy  Hypertension associated with diabetes ([name]) History of essential hypertension her blood pressure measured at 128/62.  She is on amlodipine and benazepril.  Current meds at current dosing.      [name] Harp MD FACP,FACC,FAHA, [facility] 07/27/2017 4:46 PM

## 2017-07-27 NOTE — Assessment & Plan Note (Signed)
History of hyperlipidemia on statin therapy. 

## 2017-07-27 NOTE — Assessment & Plan Note (Signed)
Ms. Kolander referred to me by Dr. Hetty Ely for symptomatic PAD.  She had segmental pressures clinic 06/10/2017.  She has typical medication but really does not walk that much.  She says that she comes back from the bathroom her calves hurt.  I am going to get lower extremity arterial Doppler studies.

## 2017-07-31 ENCOUNTER — Other Ambulatory Visit: Payer: Self-pay | Admitting: Internal Medicine

## 2017-07-31 DIAGNOSIS — G40909 Epilepsy, unspecified, not intractable, without status epilepticus: Secondary | ICD-10-CM

## 2017-08-05 ENCOUNTER — Ambulatory Visit (HOSPITAL_COMMUNITY)
Admission: RE | Admit: 2017-08-05 | Discharge: 2017-08-05 | Disposition: A | Discharge status: Home / Self Care | Payer: Medicare Other | Source: Ambulatory Visit | Attending: Cardiology | Admitting: Cardiology

## 2017-08-05 DIAGNOSIS — Z8673 Personal history of transient ischemic attack (TIA), and cerebral infarction without residual deficits: Secondary | ICD-10-CM | POA: Diagnosis not present

## 2017-08-05 DIAGNOSIS — Z87891 Personal history of nicotine dependence: Secondary | ICD-10-CM | POA: Insufficient documentation

## 2017-08-05 DIAGNOSIS — E785 Hyperlipidemia, unspecified: Secondary | ICD-10-CM | POA: Insufficient documentation

## 2017-08-05 DIAGNOSIS — I739 Peripheral vascular disease, unspecified: Secondary | ICD-10-CM

## 2017-08-05 DIAGNOSIS — R0989 Other specified symptoms and signs involving the circulatory and respiratory systems: Secondary | ICD-10-CM | POA: Diagnosis not present

## 2017-08-05 DIAGNOSIS — I10 Essential (primary) hypertension: Secondary | ICD-10-CM | POA: Insufficient documentation

## 2017-08-05 DIAGNOSIS — E1151 Type 2 diabetes mellitus with diabetic peripheral angiopathy without gangrene: Secondary | ICD-10-CM | POA: Diagnosis not present

## 2017-08-05 DIAGNOSIS — R9389 Abnormal findings on diagnostic imaging of other specified body structures: Secondary | ICD-10-CM | POA: Insufficient documentation

## 2017-08-05 DIAGNOSIS — I6523 Occlusion and stenosis of bilateral carotid arteries: Secondary | ICD-10-CM | POA: Insufficient documentation

## 2017-08-10 ENCOUNTER — Other Ambulatory Visit: Payer: Self-pay

## 2017-08-10 ENCOUNTER — Encounter: Payer: Self-pay | Admitting: Cardiovascular Disease

## 2017-08-10 ENCOUNTER — Ambulatory Visit (INDEPENDENT_AMBULATORY_CARE_PROVIDER_SITE_OTHER): Payer: Medicare Other | Admitting: Cardiovascular Disease

## 2017-08-10 DIAGNOSIS — I739 Peripheral vascular disease, unspecified: Secondary | ICD-10-CM

## 2017-08-10 NOTE — Progress Notes (Signed)
Ms. Redwine returns today for follow-up of her Doppler studies.  They performed in the evaluation of abnormal segmental pressures.  She had lower extremity arterial Doppler studies performed 08/05/2017 revealing ABIs and 8.5 range bilaterally with an occluded right SFA and high-grade left SFA stenosis.  She does not have critical limb ischemia.  She lives a fairly sedentary lifestyle staying in the house and watching TV for the most part.  She really does not ambulate much.  I discussed this with her family at this point recommend conservative treatment.  I do not recommend an invasive approach.  I will see her back as needed.   Lorretta Harp, M.D., [place], [facility], [place] [address] [place], [place]  [address]  [phone] 08/10/2017 3:28 PM

## 2017-08-10 NOTE — Telephone Encounter (Signed)
acetaminophen-codeine (TYLENOL #3) 300-30 MG tablet   [facility] request @ [facility] on [address]

## 2017-08-10 NOTE — Patient Instructions (Signed)
Your physician recommends that you schedule a follow-up appointment in: AS NEEDED  

## 2017-08-10 NOTE — Assessment & Plan Note (Signed)
[name] returns today for follow-up of her Doppler studies.  They performed in the evaluation of abnormal segmental pressures.  She had lower extremity arterial Doppler studies performed 08/05/2017 revealing ABIs and 8.5 range bilaterally with an occluded right SFA and high-grade left SFA stenosis.  She does not have critical limb ischemia.  She lives a fairly sedentary lifestyle staying in the house and watching TV for the most part.  She really does not ambulate much.  I discussed this with her family at this point recommend conservative treatment.  I do not recommend an invasive approach.  I will see her back as needed.

## 2017-08-11 MED ORDER — ACETAMINOPHEN-CODEINE #3 300-30 MG PO TABS: 1.0000 | ORAL_TABLET | Freq: Every day | ORAL | 3 refills | Status: DC | PRN

## 2017-09-21 ENCOUNTER — Encounter: Payer: Self-pay | Admitting: Internal Medicine

## 2017-09-21 ENCOUNTER — Encounter: Payer: Medicare Other | Admitting: Internal Medicine

## 2017-09-23 ENCOUNTER — Telehealth: Payer: Self-pay | Admitting: *Deleted

## 2017-09-23 NOTE — Telephone Encounter (Signed)
Call to patient-message left that the Clinics had call to see how she is doing with her Diabetes and to see if she needs meds or assistance from the Clinics.  Sander Nephew, RN 09/23/2017 11:30 AM

## 2017-10-02 ENCOUNTER — Other Ambulatory Visit: Payer: Self-pay | Admitting: Internal Medicine

## 2017-10-02 DIAGNOSIS — I739 Peripheral vascular disease, unspecified: Secondary | ICD-10-CM

## 2017-10-04 MED ORDER — CILOSTAZOL 100 MG PO TABS: 100.0000 mg | ORAL_TABLET | Freq: Two times a day (BID) | ORAL | 4 refills | Status: DC

## 2017-11-11 ENCOUNTER — Other Ambulatory Visit: Payer: Self-pay | Admitting: Internal Medicine

## 2017-11-11 DIAGNOSIS — I739 Peripheral vascular disease, unspecified: Secondary | ICD-10-CM

## 2017-11-11 NOTE — Telephone Encounter (Signed)
Needs refill on acetaminophen-codeine (TYLENOL #3) 300-[facility] [address]; pt contact [phone]

## 2017-11-15 MED ORDER — ACETAMINOPHEN-CODEINE #3 300-30 MG PO TABS: 1.0000 | ORAL_TABLET | Freq: Every day | ORAL | 3 refills | Status: DC | PRN

## 2017-11-19 ENCOUNTER — Other Ambulatory Visit: Payer: Self-pay

## 2017-11-19 NOTE — Patient Outreach (Signed)
[facility]) Care Management  11/19/2017  [name] May 04, 1933 [id number]   Medication Adherence call to Mrs. [name] left a message for patient to call back patient is due on Amlodepine/Benazepril 5/40 and Somvastatin 20 mg under [place].   [facility] Management Direct Dial [phone]  Fax [phone] [email or web address]

## 2017-11-23 ENCOUNTER — Ambulatory Visit (INDEPENDENT_AMBULATORY_CARE_PROVIDER_SITE_OTHER): Payer: Medicare Other | Admitting: Internal Medicine

## 2017-11-23 ENCOUNTER — Encounter: Payer: Self-pay | Admitting: Internal Medicine

## 2017-11-23 ENCOUNTER — Other Ambulatory Visit: Payer: Self-pay

## 2017-11-23 VITALS — BP 105/61 | HR 74 | Temp 97.8°F | Ht 64.0 in | Wt 197.6 lb

## 2017-11-23 DIAGNOSIS — Z6833 Body mass index (BMI) 33.0-33.9, adult: Secondary | ICD-10-CM

## 2017-11-23 DIAGNOSIS — E1140 Type 2 diabetes mellitus with diabetic neuropathy, unspecified: Secondary | ICD-10-CM | POA: Diagnosis not present

## 2017-11-23 DIAGNOSIS — Z794 Long term (current) use of insulin: Secondary | ICD-10-CM | POA: Diagnosis not present

## 2017-11-23 DIAGNOSIS — E1151 Type 2 diabetes mellitus with diabetic peripheral angiopathy without gangrene: Secondary | ICD-10-CM

## 2017-11-23 DIAGNOSIS — I10 Essential (primary) hypertension: Secondary | ICD-10-CM

## 2017-11-23 DIAGNOSIS — E1169 Type 2 diabetes mellitus with other specified complication: Secondary | ICD-10-CM

## 2017-11-23 DIAGNOSIS — E118 Type 2 diabetes mellitus with unspecified complications: Secondary | ICD-10-CM | POA: Diagnosis not present

## 2017-11-23 DIAGNOSIS — E1122 Type 2 diabetes mellitus with diabetic chronic kidney disease: Secondary | ICD-10-CM | POA: Diagnosis not present

## 2017-11-23 DIAGNOSIS — E1159 Type 2 diabetes mellitus with other circulatory complications: Secondary | ICD-10-CM

## 2017-11-23 DIAGNOSIS — E1142 Type 2 diabetes mellitus with diabetic polyneuropathy: Secondary | ICD-10-CM

## 2017-11-23 DIAGNOSIS — Z23 Encounter for immunization: Secondary | ICD-10-CM | POA: Diagnosis not present

## 2017-11-23 DIAGNOSIS — I130 Hypertensive heart and chronic kidney disease with heart failure and stage 1 through stage 4 chronic kidney disease, or unspecified chronic kidney disease: Secondary | ICD-10-CM

## 2017-11-23 DIAGNOSIS — G40909 Epilepsy, unspecified, not intractable, without status epilepticus: Secondary | ICD-10-CM

## 2017-11-23 DIAGNOSIS — Z85110 Personal history of malignant carcinoid tumor of bronchus and lung: Secondary | ICD-10-CM

## 2017-11-23 DIAGNOSIS — E1165 Type 2 diabetes mellitus with hyperglycemia: Secondary | ICD-10-CM | POA: Diagnosis not present

## 2017-11-23 DIAGNOSIS — I5020 Unspecified systolic (congestive) heart failure: Secondary | ICD-10-CM

## 2017-11-23 DIAGNOSIS — F329 Major depressive disorder, single episode, unspecified: Secondary | ICD-10-CM

## 2017-11-23 DIAGNOSIS — IMO0002 Reserved for concepts with insufficient information to code with codable children: Secondary | ICD-10-CM

## 2017-11-23 DIAGNOSIS — D509 Iron deficiency anemia, unspecified: Secondary | ICD-10-CM

## 2017-11-23 DIAGNOSIS — Z79899 Other long term (current) drug therapy: Secondary | ICD-10-CM

## 2017-11-23 DIAGNOSIS — H9113 Presbycusis, bilateral: Secondary | ICD-10-CM

## 2017-11-23 DIAGNOSIS — E785 Hyperlipidemia, unspecified: Secondary | ICD-10-CM

## 2017-11-23 DIAGNOSIS — Z86011 Personal history of benign neoplasm of the brain: Secondary | ICD-10-CM

## 2017-11-23 DIAGNOSIS — N189 Chronic kidney disease, unspecified: Secondary | ICD-10-CM

## 2017-11-23 DIAGNOSIS — K219 Gastro-esophageal reflux disease without esophagitis: Secondary | ICD-10-CM

## 2017-11-23 LAB — POCT GLYCOSYLATED HEMOGLOBIN (HGB A1C): HEMOGLOBIN A1C: 12.4 % — AB (ref 4.0–5.6)

## 2017-11-23 LAB — GLUCOSE, CAPILLARY: GLUCOSE-CAPILLARY: 332 mg/dL — AB (ref 70–99)

## 2017-11-23 MED ORDER — LISINOPRIL 20 MG PO TABS: 20.0000 mg | ORAL_TABLET | Freq: Every day | ORAL | 11 refills | Status: DC

## 2017-11-23 MED ORDER — METFORMIN HCL ER 500 MG PO TB24: 500.0000 mg | ORAL_TABLET | Freq: Two times a day (BID) | ORAL | 3 refills | Status: DC

## 2017-11-23 NOTE — Patient Instructions (Addendum)
Thank you for coming to the clinic today. It was a pleasure to see you.   For your diabetes, first we need to get a working meter, please try replacing the battery and calling the company that supplied this meter to figure out why it isnt working. If these routes dont work, call our office and ask for an appointment to see [name] so that she can take a look at the meter.  Switch to taking the lantus 30 units daily in the morning when she is awake and metformin 500 mg twice daily instead of the 1000 mg twice daily that she was taking in the past. Today we will check your kidney function and will call you about the plan after these results come back   For your hypertension STOP taking the amlodipine- benazepril and START taking lisinopril 20 mg daily, [facility]   FOLLOW-UP INSTRUCTIONS When: 3 months with [name] For: Follow up of your diabetes  What to bring: all of your medication bottles   Please call the internal medicine center clinic if you have any questions or concerns, we may be able to help and keep you from a long and expensive emergency room wait. Our clinic and after hours phone number is [phone], the best time to call is Monday through Friday 9 am to 4 pm but there is always someone available 24/7 if you have an emergency. If you need medication refills please notify your pharmacy one week in advance and they will send [place] a request.

## 2017-11-23 NOTE — Assessment & Plan Note (Addendum)
-   last A1c was 10 months ago and showed an A1c 11.1, follow up today 12.4. No meter available for review today, they just got a new one which was coordinated by donna but it turns off immediately after being turned on and they havent been able to use it. Plan is for them to bring the meter to a hardware store to get a new battery, if the meter doesn't work with a new battery they will bring the meter to the office so someone here can evaluate. Home nurse checks the blood sugar every morning and the highest it has been is 148.  - current medications include metformin 2000 mg daily and lantus 30 units daily. She forgets to take the lantus only about once per week, this is because her daughter prepares it for her to take at night and Ms. Dobbins often falls asleep before taking the medication. She also described having loose bowel movements at last office visit and was told to stop taking metformin, prior to this she had been increased from metformin 1000 mg daily which was not causing her trouble to 2000 mg daily. Will decrease metformin to 1000 mg daily. Will obtain BMP, if GFR is still >45 will start an oral SGLT 2 as she has preference to avoid more injections and would benefit from the cardioprotective effect of this class.  -  last was 08/2016 and showed no retinopathy, she is due for follow up and her daughter will call [facility] to schedule this  - she has no concerning lesions on foot exam today  - continue ace inhibitor therapy for renal protection   Addendum : GFR 50, will start jardiance 10 mg daily

## 2017-11-23 NOTE — Progress Notes (Addendum)
CC: follow up of diabetes   HPI:  [name] is a 84 y.o. with PMH G1DD, uncontrolled insulin-dependent T2DM w peripheral neuropathy, HTN, HLD, iron deficiency anemia, adenocarcinoma of the lung (stage 1B, +EGFR s/p lobectomy w lymph node dissection), hx of meningioma, seizure disorder, GERD, severe obesity, depression who presents for follow up of hypertension and diabetes. Please see the assessment and plans for the status of the patient chronic medical problems.   Review of Systems:  Refer to history of present illness and assessment and plans for pertinent review of systems, all others reviewed and negative  Physical Exam:  Vitals:   11/23/17 1322  BP: 105/61  Pulse: 74  Temp: 97.8 F (36.6 C)  TempSrc: Oral  SpO2: 100%  Weight: 197 lb 9.6 oz (89.6 kg)  Height: '5\' 4"'  (1.626 m)   General well appearing, no acute distress  Cardiac Regular rate and rhythm, no murmur rubs or gallops, no peripheral edema  Pulm normal work of breathing, lungs clear to auscultation  Skin feet are free of ulceration or skin breakdown   Assessment & Plan:   Type 2 Diabetes, uncontrolled  - last A1c was 10 months ago and showed an A1c 11.1, follow up today 12.4. No meter available for review today, they just got a new one which was coordinated by donna but it turns off immediately after being turned on and they havent been able to use it. Plan is for them to bring the meter to a hardware store to get a new battery, if the meter doesn't work with a new battery they will bring the meter to the office so someone here can evaluate. Home nurse checks the blood sugar every morning and the highest it has been is 148.  - current medications include metformin 2000 mg daily and lantus 30 units daily. She forgets to take the lantus only about once per week, this is because her daughter prepares it for her to take at night and [name] often falls asleep before taking the medication. She also described  having loose bowel movements at last office visit and was told to stop taking metformin, prior to this she had been increased from metformin 1000 mg daily which was not causing her trouble to 2000 mg daily. Will decrease metformin to 1000 mg daily. Will obtain BMP, if GFR is still >45 will start an oral SGLT 2 as she has preference to avoid more injections and would benefit from the cardioprotective effect of this class.  -  last was 08/2016 and showed no retinopathy, she is due for follow up and her daughter will call [facility] to schedule this  - she has no concerning lesions on foot exam today  - continue ace inhibitor therapy for renal protection  - ASCVD 10 year risk 29% - will increase from simvastatin 20 mg daily to high intensity rosuvastatin 20 mg daily   Addendum : GFR 50, will start jardiance 10 mg daily. LDL was also found to be 187, will increase to high intensity statin therapy. I called and spoke with [name] daughter to discuss these changes.   Hypertension  Blood pressure is well controlled today, well below goal with symptoms of dizziness with standing, especially first thing in the morning. Comorbid conditions of hypertension include hx of systolic heart failure, DM, CKD, and CVA.  - discontinue amlodipine - benazepril 5-40 mg daily and switch to lisinopril 20 mg daily  - continue carvedilol 25 mg BID  Hyperlipidemia  Currently taking simvastatin 20 mg daily, reporting no issues with this medication. Last LDL 100 when checked in 2015. Newly diagnosed peripheral arterial disease, controlled with pletal.  - follow up lipid panel today   Preventative health  - will give influenza vaccine today   See Encounters Tab for problem based charting.  Patient discussed with Dr. Evette Doffing

## 2017-11-23 NOTE — Assessment & Plan Note (Signed)
Blood pressure is well controlled today, well below goal with symptoms of dizziness with standing, especially first thing in the morning. Comorbid conditions of hypertension include hx of systolic heart failure, DM, CKD, and CVA.  - discontinue amlodipine - benazepril 5-40 mg daily and switch to lisinopril 20 mg daily  - continue carvedilol 25 mg BID

## 2017-11-23 NOTE — Assessment & Plan Note (Signed)
Currently taking simvastatin 20 mg daily, reporting no issues with this medication. Last LDL 100 when checked in 2015. Newly diagnosed peripheral arterial disease, controlled with pletal.  - follow up lipid panel today

## 2017-11-24 ENCOUNTER — Other Ambulatory Visit: Payer: Self-pay | Admitting: Internal Medicine

## 2017-11-24 DIAGNOSIS — E1165 Type 2 diabetes mellitus with hyperglycemia: Principal | ICD-10-CM

## 2017-11-24 DIAGNOSIS — IMO0002 Reserved for concepts with insufficient information to code with codable children: Secondary | ICD-10-CM

## 2017-11-24 DIAGNOSIS — Z794 Long term (current) use of insulin: Principal | ICD-10-CM

## 2017-11-24 DIAGNOSIS — E1140 Type 2 diabetes mellitus with diabetic neuropathy, unspecified: Secondary | ICD-10-CM

## 2017-11-24 LAB — LIPID PANEL
CHOLESTEROL TOTAL: 269 mg/dL — AB (ref 100–199)
Chol/HDL Ratio: 6.9 ratio — ABNORMAL HIGH (ref 0.0–4.4)
HDL: 39 mg/dL — ABNORMAL LOW (ref >39)
LDL CALC: 187 mg/dL — AB (ref 0–99)
TRIGLYCERIDES: 216 mg/dL — AB (ref 0–149)
VLDL CHOLESTEROL CAL: 43 mg/dL — AB (ref 5–40)

## 2017-11-24 LAB — BMP8+ANION GAP
ANION GAP: 15 mmol/L (ref 10.0–18.0)
BUN/Creatinine Ratio: 20 (ref 12–28)
BUN: 21 mg/dL (ref 8–27)
CO2: 23 mmol/L (ref 20–29)
Calcium: 8.7 mg/dL (ref 8.7–10.3)
Chloride: 101 mmol/L (ref 96–106)
Creatinine, Ser: 1.05 mg/dL — ABNORMAL HIGH (ref 0.57–1.00)
GFR calc Af Amer: 57 mL/min/{1.73_m2} — ABNORMAL LOW (ref >59)
GFR, EST NON AFRICAN AMERICAN: 50 mL/min/{1.73_m2} — AB (ref >59)
Glucose: 327 mg/dL — ABNORMAL HIGH (ref 65–99)
Potassium: 4.5 mmol/L (ref 3.5–5.2)
SODIUM: 139 mmol/L (ref 134–144)

## 2017-11-24 MED ORDER — ACCU-CHEK FASTCLIX LANCETS MISC: 12 refills | Status: DC

## 2017-11-24 MED ORDER — INSULIN GLARGINE 100 UNIT/ML SOLOSTAR PEN: PEN_INJECTOR | SUBCUTANEOUS | 11 refills | Status: DC

## 2017-11-24 MED ORDER — GLUCOSE BLOOD VI STRP: ORAL_STRIP | 12 refills | Status: DC

## 2017-11-24 MED ORDER — INSULIN PEN NEEDLE 31G X 8 MM MISC: 11 refills | Status: DC

## 2017-11-24 MED ORDER — EMPAGLIFLOZIN 10 MG PO TABS: 10.0000 mg | ORAL_TABLET | Freq: Every day | ORAL | 3 refills | Status: DC

## 2017-11-24 MED ORDER — ROSUVASTATIN CALCIUM 20 MG PO TABS: 20.0000 mg | ORAL_TABLET | Freq: Every day | ORAL | 3 refills | Status: DC

## 2017-11-24 NOTE — Progress Notes (Signed)
Internal Medicine Clinic Attending  Case discussed with Dr. Blum at the time of the visit.  We reviewed the resident's history and exam and pertinent patient test results.  I agree with the assessment, diagnosis, and plan of care documented in the resident's note. 

## 2017-11-24 NOTE — Addendum Note (Signed)
Addended by: Meryl Dare on: 11/24/2017 09:03 AM   Modules accepted: Orders

## 2017-11-26 ENCOUNTER — Telehealth: Payer: Self-pay | Admitting: Dietician

## 2017-11-26 DIAGNOSIS — Z794 Long term (current) use of insulin: Principal | ICD-10-CM

## 2017-11-26 DIAGNOSIS — E1140 Type 2 diabetes mellitus with diabetic neuropathy, unspecified: Secondary | ICD-10-CM

## 2017-11-26 DIAGNOSIS — IMO0002 Reserved for concepts with insufficient information to code with codable children: Secondary | ICD-10-CM

## 2017-11-26 DIAGNOSIS — E1165 Type 2 diabetes mellitus with hyperglycemia: Principal | ICD-10-CM

## 2017-11-27 ENCOUNTER — Other Ambulatory Visit: Payer: Self-pay | Admitting: Internal Medicine

## 2017-11-27 DIAGNOSIS — Z794 Long term (current) use of insulin: Principal | ICD-10-CM

## 2017-11-27 DIAGNOSIS — E1140 Type 2 diabetes mellitus with diabetic neuropathy, unspecified: Secondary | ICD-10-CM

## 2017-11-27 DIAGNOSIS — E1165 Type 2 diabetes mellitus with hyperglycemia: Principal | ICD-10-CM

## 2017-11-27 DIAGNOSIS — IMO0002 Reserved for concepts with insufficient information to code with codable children: Secondary | ICD-10-CM

## 2017-11-27 MED ORDER — GLUCOSE BLOOD VI STRP: ORAL_STRIP | 12 refills | Status: DC

## 2017-12-01 NOTE — Telephone Encounter (Signed)
Spoke with [name]- they fixed the meter by buying batteries. Now they needs strips for nano meter- smartview test strips. Pharmacy does not have a prescription.  Blood sugars was 200s then has been, 142 this morning [name] also said that metformin makes her mom sick, she  lays around won't eat when she takes it , when she doesn't take it she's up around and feels better. [name] broke into a sweat; the day after they came from our office, felt bad. Got better, but started complaining about  the white pill-(metformin,) none yesterday and now [name] is doing well and feels better.  . Plan- request prescripton for correct test strips, agreed with [name] to continue to hold metformin for now until response from Dr. Hetty Ely. Next appointment 03/22/18.

## 2017-12-02 MED ORDER — GLUCOSE BLOOD VI STRP: ORAL_STRIP | 12 refills | Status: DC

## 2017-12-02 NOTE — Telephone Encounter (Signed)
Thank you for uncovering this difficulty with metformin. I agree, will take the medication off of her medication list, she was already taking the extended release formulation and still experiencing the side effects so we will need to switch medications based on her glucometer readings at follow up. She should continue to take the insulin glargine.

## 2017-12-06 ENCOUNTER — Other Ambulatory Visit: Payer: Self-pay

## 2017-12-06 ENCOUNTER — Emergency Department (HOSPITAL_COMMUNITY): Payer: Medicare Other

## 2017-12-06 ENCOUNTER — Encounter (HOSPITAL_COMMUNITY): Payer: Self-pay | Admitting: Emergency Medicine

## 2017-12-06 ENCOUNTER — Emergency Department (HOSPITAL_COMMUNITY)
Admission: EM | Admit: 2017-12-06 | Discharge: 2017-12-07 | Disposition: A | Discharge status: Home / Self Care | Payer: Medicare Other | Attending: Emergency Medicine | Admitting: Emergency Medicine

## 2017-12-06 DIAGNOSIS — I110 Hypertensive heart disease with heart failure: Secondary | ICD-10-CM | POA: Insufficient documentation

## 2017-12-06 DIAGNOSIS — I959 Hypotension, unspecified: Secondary | ICD-10-CM | POA: Diagnosis not present

## 2017-12-06 DIAGNOSIS — R531 Weakness: Secondary | ICD-10-CM | POA: Diagnosis not present

## 2017-12-06 DIAGNOSIS — Z8673 Personal history of transient ischemic attack (TIA), and cerebral infarction without residual deficits: Secondary | ICD-10-CM | POA: Diagnosis not present

## 2017-12-06 DIAGNOSIS — Z79899 Other long term (current) drug therapy: Secondary | ICD-10-CM | POA: Insufficient documentation

## 2017-12-06 DIAGNOSIS — R0902 Hypoxemia: Secondary | ICD-10-CM | POA: Diagnosis not present

## 2017-12-06 DIAGNOSIS — Z87891 Personal history of nicotine dependence: Secondary | ICD-10-CM | POA: Insufficient documentation

## 2017-12-06 DIAGNOSIS — Z7982 Long term (current) use of aspirin: Secondary | ICD-10-CM | POA: Insufficient documentation

## 2017-12-06 DIAGNOSIS — E119 Type 2 diabetes mellitus without complications: Secondary | ICD-10-CM | POA: Insufficient documentation

## 2017-12-06 DIAGNOSIS — Z794 Long term (current) use of insulin: Secondary | ICD-10-CM | POA: Insufficient documentation

## 2017-12-06 DIAGNOSIS — I5032 Chronic diastolic (congestive) heart failure: Secondary | ICD-10-CM | POA: Diagnosis not present

## 2017-12-06 DIAGNOSIS — I454 Nonspecific intraventricular block: Secondary | ICD-10-CM | POA: Diagnosis not present

## 2017-12-06 DIAGNOSIS — R410 Disorientation, unspecified: Secondary | ICD-10-CM | POA: Diagnosis not present

## 2017-12-06 DIAGNOSIS — R0989 Other specified symptoms and signs involving the circulatory and respiratory systems: Secondary | ICD-10-CM | POA: Diagnosis not present

## 2017-12-06 DIAGNOSIS — I447 Left bundle-branch block, unspecified: Secondary | ICD-10-CM | POA: Diagnosis not present

## 2017-12-06 LAB — CBC WITH DIFFERENTIAL/PLATELET
Abs Immature Granulocytes: 0.03 10*3/uL (ref 0.00–0.07)
Basophils Absolute: 0 10*3/uL (ref 0.0–0.1)
Basophils Relative: 0 %
EOS PCT: 1 %
Eosinophils Absolute: 0.1 10*3/uL (ref 0.0–0.5)
HCT: 37.1 % (ref 36.0–46.0)
HEMOGLOBIN: 10.6 g/dL — AB (ref 12.0–15.0)
Immature Granulocytes: 1 %
LYMPHS ABS: 2 10*3/uL (ref 0.7–4.0)
LYMPHS PCT: 31 %
MCH: 23.5 pg — ABNORMAL LOW (ref 26.0–34.0)
MCHC: 28.6 g/dL — AB (ref 30.0–36.0)
MCV: 82.1 fL (ref 80.0–100.0)
Monocytes Absolute: 0.6 10*3/uL (ref 0.1–1.0)
Monocytes Relative: 9 %
Neutro Abs: 3.8 10*3/uL (ref 1.7–7.7)
Neutrophils Relative %: 58 %
Platelets: 256 10*3/uL (ref 150–400)
RBC: 4.52 MIL/uL (ref 3.87–5.11)
RDW: 15.5 % (ref 11.5–15.5)
WBC: 6.5 10*3/uL (ref 4.0–10.5)
nRBC: 0 % (ref 0.0–0.2)

## 2017-12-06 LAB — COMPREHENSIVE METABOLIC PANEL
ALT: 9 U/L (ref 0–44)
AST: 13 U/L — AB (ref 15–41)
Albumin: 3.1 g/dL — ABNORMAL LOW (ref 3.5–5.0)
Alkaline Phosphatase: 69 U/L (ref 38–126)
Anion gap: 9 (ref 5–15)
BUN: 21 mg/dL (ref 8–23)
CALCIUM: 8.3 mg/dL — AB (ref 8.9–10.3)
CO2: 23 mmol/L (ref 22–32)
CREATININE: 1.62 mg/dL — AB (ref 0.44–1.00)
Chloride: 109 mmol/L (ref 98–111)
GFR calc non Af Amer: 28 mL/min — ABNORMAL LOW (ref >60)
GFR, EST AFRICAN AMERICAN: 33 mL/min — AB (ref >60)
Glucose, Bld: 251 mg/dL — ABNORMAL HIGH (ref 70–99)
Potassium: 3.9 mmol/L (ref 3.5–5.1)
SODIUM: 141 mmol/L (ref 135–145)
Total Bilirubin: 0.5 mg/dL (ref 0.3–1.2)
Total Protein: 6.3 g/dL — ABNORMAL LOW (ref 6.5–8.1)

## 2017-12-06 LAB — CBG MONITORING, ED: Glucose-Capillary: 206 mg/dL — ABNORMAL HIGH (ref 70–99)

## 2017-12-06 LAB — I-STAT TROPONIN, ED: TROPONIN I, POC: 0 ng/mL (ref 0.00–0.08)

## 2017-12-06 NOTE — ED Provider Notes (Addendum)
[facility] EMERGENCY DEPARTMENT Provider Note   CSN: [id number] Arrival date & time: 12/06/17  1948     History   Chief Complaint No chief complaint on file.   HPI [name] is a 84 y.o. female.  Patient's family states that patient has been weak and has had difficulty standing now for about a week.  Did have some changes on her blood pressure medications were things were added in.  Patient was brought in by EMS coming from home for generalized weakness.  Things seem to be worse today.  Family states she had 2 bouts of loose bowel movements.  Is not been eating or drinking properly as of late.  EMS reported patient was hypotensive with a blood pressure of 80/50 while sitting.  EMS gave her 400 cc of normal saline with improvement in blood pressure.  Upon arrival to the hospital patient's blood pressure reportedly was 99 systolic.  Now currently is 893 systolic.  Patient has a history of dementia but she is alert.  Patient's blood sugar was 242.  Patient does complain of feeling weak and perhaps some dizziness but no vertigo.  Patient is apparently on [place] as well.  Patient's past medical history is significant for an old right embolic stroke.  History of congestive heart failure diabetes and hypertension hyperlipidemia.  And patient also had no adenocarcinoma of the lung with a right lower lobe lobectomy in 2012.     Past Medical History:  Diagnosis Date  . Adenocarcinoma of lung ([place])     Right upper lobe adenocarcinoma. s/p right lower lobectomy 12/15/10  . ARTHRITIS, KNEE 03/24/2006  . CHF (congestive heart failure) ([place])   . CVA (cerebrovascular accident) ([place]) 8101    right embolic stroke, no residual deficits  . Depression 12/02/2010  . DIABETES MELLITUS, TYPE II 2000  . Diverticulitis   . GERD 12/04/2005  . Hemangioma of liver 12/02/2010  . HYPERLIPIDEMIA 12/04/2005  . HYPERTENSION 12/04/2005  . Meningioma ([place]) 07/21/2010    Patient Active  Problem List   Diagnosis Date Noted  . PAD (peripheral artery disease) ([place]) 06/10/2017  . Liver hemangioma 11/13/2016  . Severe obesity (BMI 35.0-39.9) with comorbidity ([place]) 08/20/2016  . Hearing loss 08/20/2016  . Long term current use of opiate analgesic 02/19/2016  . Right knee pain 10/30/2014  . Seizure disorder ([place]) 10/06/2013  . Iron deficiency anemia 04/18/2013  . History of pulmonary embolism 04/06/2013  . Right upper lobe, Adenocarcinoma of lung   . Depression 12/02/2010  . Chronic diastolic congestive heart failure ([place])   . History of meningioma of the brain 07/21/2010  . Incontinence of urine 11/29/2007  . Uncontrolled type 2 diabetes mellitus with diabetic neuropathy, with long-term current use of insulin ([place]) 12/04/2005  . Hyperlipidemia associated with type 2 diabetes mellitus ([place]) 12/04/2005  . Hypertension associated with diabetes ([place]) 12/04/2005  . GERD 12/04/2005  . History of CVA (cerebrovascular accident) 12/02/2002    Past Surgical History:  Procedure Laterality Date  . ABDOMINAL HYSTERECTOMY    . CRANIOTOMY N/A 03/02/2013   Procedure: CRANIOTOMY TUMOR EXCISION;  Surgeon: Winfield Cunas, MD;  Location: [facility];  Service: Neurosurgery;  Laterality: N/A;  Bifrontal Craniotomy for tumor  . Extracapsular cataract extraction with intraocular     lens implantation.  . INCISION AND DRAINAGE PERIRECTAL ABSCESS N/A 01/19/2013   Procedure: IRRIGATION AND DEBRIDEMENT PERIRECTAL ABSCESS;  Surgeon: Zenovia Jarred, MD;  Location: [facility];  Service: General;  Laterality: N/A;  .  RIGHT VATS,RIGHT THORACOTOMY,RIGHT LOWER LOBECTOMY WITH NODE DISSECTION    . Video bronchoscope.  12/29/2007   [place]  . Wide excision of left upper back mass.       OB History   None      Home Medications    Prior to Admission medications   Medication Sig Start Date End Date Taking? Authorizing Provider  aspirin EC 81 MG tablet Take 81 mg by mouth daily.   Yes [provider]  carvedilol (COREG) 25 MG tablet TAKE 1 TABLET BY MOUTH 2 TIMES DAILY WITH A MEAL. Patient taking differently: Take 25 mg by mouth 2 (two) times daily with a meal.  03/26/17  Yes [name], MD  cilostazol (PLETAL) 100 MG tablet Take 1 tablet (100 mg total) by mouth 2 (two) times daily. 10/04/17  Yes [name], MD  diclofenac sodium (VOLTAREN) 1 % GEL Apply 4 g topically 4 (four) times daily as needed (pain). 01/18/16  Yes [name], MD  empagliflozin (JARDIANCE) 10 MG TABS tablet Take 10 mg by mouth daily. 11/24/17  Yes [name], MD  FLUoxetine (PROZAC) 10 MG capsule TAKE 1 CAPSULE (10 MG TOTAL) BY MOUTH DAILY. Patient taking differently: Take 10 mg by mouth daily.  04/19/17  Yes [name], MD  Insulin Glargine (LANTUS SOLOSTAR) 100 UNIT/ML Solostar Pen Inject 30 units at bedtime Patient taking differently: Inject 30 Units into the skin daily.  11/24/17  Yes [name], MD  iron polysaccharides (FERREX 150) 150 MG capsule TAKE 1 CAPSULE BY MOUTH EVERY DAY Patient taking differently: Take 150 mg by mouth daily.  04/19/17  Yes [name], MD  levETIRAcetam (KEPPRA) 750 MG tablet TAKE 1 TABLET (750 MG TOTAL) BY MOUTH 2 (TWO) TIMES DAILY. Patient taking differently: Take 750 mg by mouth 2 (two) times daily.  04/09/17  Yes [name], MD  lisinopril (PRINIVIL,ZESTRIL) 20 MG tablet Take 1 tablet (20 mg total) by mouth daily. 11/23/17 11/23/18 Yes [name], MD  omeprazole (PRILOSEC) 20 MG capsule (01/30/16) TAKE 1 CAPSULE BY MOUTH DAILY. Patient taking differently: Take 20 mg by mouth daily.  04/19/17  Yes [name], MD  rosuvastatin (CRESTOR) 20 MG tablet Take 1 tablet (20 mg total) by mouth daily. 11/24/17  Yes [name], MD  ACCU-CHEK FASTCLIX LANCETS MISC CHECK BLOOD SUGAR THREE TIMES A DAY. DIAG CODE E11.9. INSULIN DEPENDENT 11/25/17   [name], MD  acetaminophen-codeine (TYLENOL #3) 300-30 MG tablet Take 1 tablet by mouth daily as needed for moderate pain or severe pain. Patient not  taking: Reported on 12/06/2017 11/15/17   [name], MD  Blood Glucose Monitoring Suppl (ACCU-CHEK GUIDE) w/Device KIT 1 each by Does not apply route 3 (three) times daily. 10/02/16   Aldine Contes, MD  glucose blood (ACCU-CHEK SMARTVIEW) test strip check blood sugar 3x a day as instructed 12/02/17   [name], MD  Incontinence Supplies MISC For use to address bowel and bladder incontinence. 07/04/15   Liberty Handy, MD  Insulin Pen Needle 31G X 8 MM MISC Use to administer lantus once daily 11/24/17   [name], MD    Family History Family History  Problem Relation Age of Onset  . Hyperlipidemia Brother   . Hypertension Brother   . Diabetes Brother     Social History Social History   Tobacco Use  . Smoking status: Former Smoker    Types: Cigarettes    Last attempt to quit: 02/17/2000    Years since quitting: 17.8  .  Smokeless tobacco: Never Used  Substance Use Topics  . Alcohol use: No    Alcohol/week: 0.0 standard drinks  . Drug use: No     Allergies   Patient has no known allergies.   Review of Systems Review of Systems  Constitutional: Negative for fever.  HENT: Negative for congestion.   Eyes: Negative for visual disturbance.  Respiratory: Negative for shortness of breath.   Cardiovascular: Negative for chest pain.  Gastrointestinal: Positive for diarrhea. Negative for abdominal pain and vomiting.  Genitourinary: Negative for dysuria.  Musculoskeletal: Negative for back pain.  Skin: Negative for rash.  Neurological: Positive for dizziness, weakness and light-headedness.  Hematological: Does not bruise/bleed easily.  Psychiatric/Behavioral: Negative for confusion.     Physical Exam Updated Vital Signs BP 125/65   Pulse 85   Temp 98.3 F (36.8 C) (Oral)   Resp 19   Ht 1.626 m (_0 )   Wt 89.4 kg   SpO2 95%   BMI 33.81 kg/m   Physical Exam  Constitutional: She is oriented to person, place, and time. She appears well-developed and well-nourished. No  distress.  HENT:  Head: Normocephalic and atraumatic.  Mouth/Throat: Oropharynx is clear and moist.  Eyes: Pupils are equal, round, and reactive to light. Conjunctivae and EOM are normal.  Cardiovascular: Normal rate, regular rhythm and normal heart sounds.  Pulmonary/Chest: Effort normal and breath sounds normal. No respiratory distress.  Abdominal: Soft. Bowel sounds are normal. There is no tenderness.  Musculoskeletal: Normal range of motion. She exhibits no edema or deformity.  Neurological: She is alert and oriented to person, place, and time. No cranial nerve deficit or sensory deficit. She exhibits normal muscle tone.  Skin: Skin is warm.  Nursing note and vitals reviewed.    ED Treatments / Results  Labs (all labs ordered are listed, but only abnormal results are displayed) Labs Reviewed  COMPREHENSIVE METABOLIC PANEL - Abnormal; Notable for the following components:      Result Value   Glucose, Bld 251 (*)    Creatinine, Ser 1.62 (*)    Calcium 8.3 (*)    Total Protein 6.3 (*)    Albumin 3.1 (*)    AST 13 (*)    GFR calc non Af Amer 28 (*)    GFR calc Af Amer 33 (*)    All other components within normal limits  CBC WITH DIFFERENTIAL/PLATELET - Abnormal; Notable for the following components:   Hemoglobin 10.6 (*)    MCH 23.5 (*)    MCHC 28.6 (*)    All other components within normal limits  CBG MONITORING, ED - Abnormal; Notable for the following components:   Glucose-Capillary 206 (*)    All other components within normal limits  URINALYSIS, ROUTINE W REFLEX MICROSCOPIC  LEVETIRACETAM LEVEL  I-STAT TROPONIN, ED    EKG EKG Interpretation  Date/Time:  Monday December 06 2017 21:29:46 EDT Ventricular Rate:  84 PR Interval:    QRS Duration: 136 QT Interval:  433 QTC Calculation: 512 R Axis:   -40 Text Interpretation:  Sinus rhythm Left bundle branch block ST elevation suggests acute pericarditis Prolonged QT interval No significant change since last tracing  Confirmed by Fredia Sorrow [phone]) on 12/06/2017 9:36:21 PM   Radiology Dg Chest 2 View  Result Date: 12/06/2017 CLINICAL DATA:  Weakness EXAM: CHEST - 2 VIEW COMPARISON:  10/13/2014 FINDINGS: Cardiomegaly. Mild vascular congestion. No confluent opacities or effusions. No acute bony abnormality. IMPRESSION: Cardiomegaly, vascular congestion. Electronically Signed   By: Lennette Bihari  [place] M.D.   On: 12/06/2017 22:08   Ct Head Wo Contrast  Result Date: 12/06/2017 CLINICAL DATA:  Weakness EXAM: CT HEAD WITHOUT CONTRAST TECHNIQUE: Contiguous axial images were obtained from the base of the skull through the vertex without intravenous contrast. COMPARISON:  10/12/2014 FINDINGS: Brain: Large old right frontal infarct with encephalomalacia, stable. There is atrophy and chronic small vessel disease changes. Associated ventriculomegaly. No acute intracranial abnormality. Specifically, no hemorrhage, hydrocephalus, mass lesion, acute infarction, or significant intracranial injury. Vascular: No hyperdense vessel or unexpected calcification. Skull: No acute calvarial abnormality. Prior right frontal craniotomy. Sinuses/Orbits: Mucosal thickening throughout the paranasal sinuses. Other: None IMPRESSION: Old right frontal infarct with encephalomalacia. Atrophy, chronic small vessel disease. No acute intracranial abnormality. Electronically Signed   By: Rolm Baptise M.D.   On: 12/06/2017 22:24    Procedures Procedures (including critical care time)  Medications Ordered in ED Medications - No data to display   Initial Impression / Assessment and Plan / ED Course  I have reviewed the triage vital signs and the nursing notes.  Pertinent labs & imaging results that were available during my care of the patient were reviewed by me and considered in my medical decision making (see chart for details).     Patient's labs without significant abnormalities other than elevated blood sugar creatinine slightly elevated  from baseline.  Troponin negative chest x-ray negative.  Head CT without any acute findings does show evidence of the old stroke.  EKG  without any significant arrhythmias.  And patient's troponin was negative.  Patient's blood pressures here in the emergency department have been very normal with systolics around 161.  Will get orthostatic blood pressures to see if they drop.  Urinalysis has been ordered and is pending as well.  If patient drops her blood pressure she will require admission.  Final Clinical Impressions(s) / ED Diagnoses   Final diagnoses:  Weakness    ED Discharge Orders    None       Fredia Sorrow, MD 12/07/17 0005  Addendum: Patient's orthostatic blood pressure she did have a drop from 140 laying down to 105 standing but was asymptomatic.  Long discussion with family patient does not want to be admitted they decided to go take her home and follow-up with her doctor.  They now think that the change in her medications were more diabetic related than blood pressure related.  But it does appear that patient may be does not need to be on blood pressure medicines.  We will have him hold the blood pressure medicines for now and follow-up with her primary care doctor.  It appears that lisinopril is a blood pressure medicine that she is definitely on.  We will have the family hold that it appears that she may also be on Coreg.  We will have family hold that as well.  Until they can get back to her primary care doctor to determine what she should be on.    Fredia Sorrow, MD 12/07/17 0960    Fredia Sorrow, MD 12/07/17 0050  Addendum: Reassessment with family raised the issue that it appears that the lisinopril is been a new medication that was started on October 8.  Daughter did seem to remember that that is true this may be the culprit and the blood pressure.  And then have them hold that.  Patient's been on Coreg for long-standing.  So the lisinopril may be part of the  problem.  They will follow-up with her doctor Dr.  Crissie Figures, Nicki Reaper, MD 12/07/17 [phone]

## 2017-12-06 NOTE — ED Notes (Signed)
Cleaned pt up and placed purewick

## 2017-12-06 NOTE — ED Notes (Signed)
Purewick placed on pt. 

## 2017-12-06 NOTE — ED Notes (Signed)
Pt's family st's several of pt's meds have been changed lately.  Family st's she has not felt good since meds were changed,   Pt is alert and oriented per her norm at this time

## 2017-12-06 NOTE — ED Triage Notes (Signed)
[facility] reports pt is coming from home for generalized weakness that started 1 week ago but same got worse today. EMS reports that family told them the pt has 2 bouts of diarrhea today and has not been eating or drinking properly as of late. EMS reports pt was hypotensive at 80/50 while sitting. EMS administered 485ml of normal saline with an improvement in her blood pressure, same on arrival at hospital is 99/58 with a CBG of 242, P-84, R-16. EMS reports hx of dementia but CAO x4 for them. EMS reports pt complains of dizziness and weakness.

## 2017-12-07 ENCOUNTER — Telehealth: Payer: Self-pay | Admitting: *Deleted

## 2017-12-07 DIAGNOSIS — R531 Weakness: Secondary | ICD-10-CM | POA: Diagnosis not present

## 2017-12-07 LAB — URINALYSIS, ROUTINE W REFLEX MICROSCOPIC
Bilirubin Urine: NEGATIVE
Glucose, UA: >=500 mg/dL — AB
Ketones, ur: NEGATIVE mg/dL
Nitrite: NEGATIVE
PROTEIN: NEGATIVE mg/dL
SPECIFIC GRAVITY, URINE: 1.023 (ref 1.005–1.030)
pH: 5 (ref 5.0–8.0)

## 2017-12-07 NOTE — Telephone Encounter (Signed)
Pt daughter is calling back, Pls someone call pt back so they will know what to do regarding blood pressure medicine [phone]

## 2017-12-07 NOTE — Telephone Encounter (Signed)
Daughter called in states she took patient to ED last night for low BP. Was told to hold carvedilol and lisinopril till she speaks with PCP. Does not want to bring patient to clinic. States it's not carvedilol causing low BP as she has been on this since 2014. Believes it is lisinopril. Requesting call from PCP. Hubbard Hartshorn, RN, BSN

## 2017-12-07 NOTE — Telephone Encounter (Signed)
I was able to reach [place] and have a conversation about what her mother has been going through in the past week. After resuming the metformin immediately after our last office visit she experienced an upset stomach and diaphoresis so she stopped taking this medication. On the same day as resuming the metformin she also started taking the jardiance and lisinopril ( this had been a change at last office visit when her amlodipine - benazapril was stopped). She had no further symptoms until yesterday when she became lethargic and unable to ambulate after having two loose bowel movements. [name] notes that Ms. Saintil was found to be hypotensive en route to the ED and on review of labs taken I can see that she was found to have an AKI and drop in her hemoglobin from baseline and a normal glucose, the chart notes that she also had positive orthostatics. She was given IV fluid bolus and improved to baseline and was able to ambulate by the time she was discharged from the ED. Ms. Halbleib declined admission and has not taken any more lisinopril since returning home and she has remained at baseline. [name] is concerned and feels confused by pieces of information provided in the ED, she was told that " the diabetes is fine" and is frustrated about recent medication changes this office visit given that information. What I believe happened is Ms. Seman was found to have a blood glucose of 200 and the family was told that this was not the cause of her presenting symptoms. I explained that the blood glucose is not controlled ( her last A1c was 12) and that we will need to continue working on this. I asked that Ms. Gariepy to hold off on taking any further lisinopril and schedule a continuity clinic visit ASAP to further screen for symptoms of angioedema and GI bleeding most importantly and recheck renal function, hemoglobin, and orthostatic vitals. I will send a request to the front desk for scheduling of this acute care clinic visit.

## 2017-12-07 NOTE — Discharge Instructions (Signed)
Return for any new or worse symptoms.  Hold blood pressure medicines that would be the Coreg and the lisinopril.  Contact her doctor about the low blood pressure problems and they can follow-up from there.  Urinalysis not checked here tonight but she stated that she did not seem to have any problem peeing.  So we will let her go home without that being known.  At the time of discharge she seems to be doing well.  Return for any new or worse symptoms.

## 2017-12-07 NOTE — Telephone Encounter (Signed)
I attempt to call Ms. Kedzierski's daughter, the phone connection was not good and her explanation of the situation was not understandable. She seemed to be describing issues with the lisinopril and metformin. She insisted that it was the clinic phone line, I will attempt to call her from a different clinic phone between my patients this afternoon.

## 2017-12-07 NOTE — Addendum Note (Signed)
Addended by: Meryl Dare on: 12/07/2017 07:19 PM   Modules accepted: Orders

## 2017-12-08 LAB — URINE CULTURE

## 2017-12-08 NOTE — Telephone Encounter (Signed)
Spoke with the pt's caregiver daughter [name]) this morning.  She would like to wait for her mother to see you on 12/14/2017 @ 1:45pm instead of coming to the [facility].  The daughter will call the clinic for a sooner appointment if needed.

## 2017-12-09 LAB — LEVETIRACETAM LEVEL: Levetiracetam Lvl: 31.6 ug/mL (ref 10.0–40.0)

## 2017-12-14 ENCOUNTER — Other Ambulatory Visit: Payer: Self-pay

## 2017-12-14 ENCOUNTER — Encounter: Payer: Self-pay | Admitting: Internal Medicine

## 2017-12-14 ENCOUNTER — Ambulatory Visit (INDEPENDENT_AMBULATORY_CARE_PROVIDER_SITE_OTHER): Payer: Medicare Other | Admitting: Internal Medicine

## 2017-12-14 VITALS — BP 102/57 | HR 80 | Temp 98.0°F | Ht 64.0 in | Wt 195.7 lb

## 2017-12-14 DIAGNOSIS — E1159 Type 2 diabetes mellitus with other circulatory complications: Secondary | ICD-10-CM | POA: Diagnosis not present

## 2017-12-14 DIAGNOSIS — E785 Hyperlipidemia, unspecified: Secondary | ICD-10-CM

## 2017-12-14 DIAGNOSIS — Z85110 Personal history of malignant carcinoid tumor of bronchus and lung: Secondary | ICD-10-CM

## 2017-12-14 DIAGNOSIS — F329 Major depressive disorder, single episode, unspecified: Secondary | ICD-10-CM

## 2017-12-14 DIAGNOSIS — Z6833 Body mass index (BMI) 33.0-33.9, adult: Secondary | ICD-10-CM

## 2017-12-14 DIAGNOSIS — N179 Acute kidney failure, unspecified: Secondary | ICD-10-CM

## 2017-12-14 DIAGNOSIS — I10 Essential (primary) hypertension: Secondary | ICD-10-CM

## 2017-12-14 DIAGNOSIS — G40909 Epilepsy, unspecified, not intractable, without status epilepticus: Secondary | ICD-10-CM

## 2017-12-14 DIAGNOSIS — K219 Gastro-esophageal reflux disease without esophagitis: Secondary | ICD-10-CM

## 2017-12-14 DIAGNOSIS — Z86011 Personal history of benign neoplasm of the brain: Secondary | ICD-10-CM

## 2017-12-14 DIAGNOSIS — D509 Iron deficiency anemia, unspecified: Secondary | ICD-10-CM

## 2017-12-14 DIAGNOSIS — Z794 Long term (current) use of insulin: Secondary | ICD-10-CM

## 2017-12-14 DIAGNOSIS — Z79899 Other long term (current) drug therapy: Secondary | ICD-10-CM

## 2017-12-14 DIAGNOSIS — E1142 Type 2 diabetes mellitus with diabetic polyneuropathy: Secondary | ICD-10-CM | POA: Diagnosis not present

## 2017-12-14 LAB — GLUCOSE, CAPILLARY: Glucose-Capillary: 174 mg/dL — ABNORMAL HIGH (ref 70–99)

## 2017-12-14 MED ORDER — CARVEDILOL 12.5 MG PO TABS: 12.5000 mg | ORAL_TABLET | Freq: Two times a day (BID) | ORAL | 2 refills | Status: DC

## 2017-12-14 NOTE — Patient Instructions (Signed)
Thank you for coming to the clinic today. It was a pleasure to see you.   For your blood pressure, please decrease your dose of carvedilol to 12.5 mg twice per day ( before you were taking 25 mg twice per day). If you are able to please pick up a blood pressure cuff to use for monitoring at home and bring the blood pressures that you have found labeled with date and time to your next office visit.   FOLLOW-UP INSTRUCTIONS When: 1 month with Dr. Hetty Ely   For: Follow up of your blood pressure  What to bring: all of your medication bottles   Please call the internal medicine center clinic if you have any questions or concerns, we may be able to help and keep you from a long and expensive emergency room wait. Our clinic and after hours phone number is [phone], the best time to call is Monday through Friday 9 am to 4 pm but there is always someone available 24/7 if you have an emergency. If you need medication refills please notify your pharmacy one week in advance and they will send [place] a request.

## 2017-12-14 NOTE — Assessment & Plan Note (Signed)
At last office visit patient was found to be hypotensive (105/61) with symptoms of orthostatic hypotension. She was asked to stop taking amlodipine, benazepril was changed to lisinopril equivalent dose, and carvedilol was continued. She was also asked to resume metformin and start taking jardiance. When she began taking the lisinopril and metformin she started having multiple daily loose bowel movements. This was a symptom that she had in the past when taking metformin so she stopped taking the metformin immediately. The diarrhea continued through the week and progressively she became weaker and more lethargic and less interactive. Her daughter did not note any specific complaints of abdominal pain, difficulties with breathing, or lip swelling. Eventually EMS was called and found that she was hypotensive. In the ED she was found to have an AKI and positive orthostatics. Her mental status improved after she was given an IV bolus and she was discharged with instructions to stop taking the lisinopril. The diarrhea resolved a couple of days after her ED visit. Today she is here for follow up. She notes that she is feels that she is back to her usual self, her daughter agrees.  This new hypotension is unusual, possible related factors could be a change in the timing of taking her medications ( this was an afternoon visit and her daughter notes that the beta blocker had been taken just prior) possibly complicated by fluid losses and dehydration related to the new medication jardiance. Today we will recheck her renal function to evaluate for improvement of her AKI and reduce the dose of the carvedilol.  - decrease carvedilol to 12.5 mg BID  - follow up BMP today

## 2017-12-14 NOTE — Progress Notes (Signed)
   CC: follow up of AKI   HPI:  [name] is a 84 y.o. with PMHG1DD, uncontrolled insulin-dependent T2DM w peripheral neuropathy, HTN, HLD, iron deficiency anemia, adenocarcinoma of the lung (stage 1B, +EGFR s/p lobectomy w lymph node dissection), hx of meningioma, seizure disorder, GERD, severe obesity, depressionwho presents for follow up of hypertension and AKI. Please see the assessment and plans for the status of the patient chronic medical problems.  Past Medical History:  Diagnosis Date  . Adenocarcinoma of lung ([place])     Right upper lobe adenocarcinoma. s/p right lower lobectomy 12/15/10  . ARTHRITIS, KNEE 03/24/2006  . CHF (congestive heart failure) ([place])   . CVA (cerebrovascular accident) ([place]) 4196    right embolic stroke, no residual deficits  . Depression 12/02/2010  . DIABETES MELLITUS, TYPE II 2000  . Diverticulitis   . GERD 12/04/2005  . Hemangioma of liver 12/02/2010  . HYPERLIPIDEMIA 12/04/2005  . HYPERTENSION 12/04/2005  . Meningioma ([place]) 07/21/2010   Review of Systems:  Refer to history of present illness and assessment and plans for pertinent review of systems, all others reviewed and negative.   Physical Exam:  Vitals:   12/14/17 1415  BP: (!) 102/57  Pulse: 80  Temp: 98 F (36.7 C)  TempSrc: Oral  SpO2: 100%  Weight: 195 lb 11.2 oz (88.8 kg)  Height: _0  (1.626 m)   General: well appearing, no acute distress, moist mucous membranes  Cardiac: regular rate and rhythm, no murmur, no peripheral edema  Pulm: normal work of breathing, lungs clear to auscultation  Abdomen: soft, non tender, non distended   Assessment & Plan:   Hypertension  AKI  At last office visit patient was found to be hypotensive (105/61) with symptoms of orthostatic hypotension. She was asked to stop taking amlodipine, benazepril was changed to lisinopril equivalent dose, and carvedilol was continued. She was also asked to resume metformin and start taking jardiance.  When she began taking the lisinopril and metformin she started having multiple daily loose bowel movements. This was a symptom that she had in the past when taking metformin so she stopped taking the metformin immediately. The diarrhea continued through the week and progressively she became weaker and more lethargic and less interactive. Her daughter did not note any specific complaints of abdominal pain, difficulties with breathing, or lip swelling. Eventually EMS was called and found that she was hypotensive. In the ED she was found to have an AKI and positive orthostatics. Her mental status improved after she was given an IV bolus and she was discharged with instructions to stop taking the lisinopril. The diarrhea resolved a couple of days after her ED visit. Today she is here for follow up. She notes that she is feels that she is back to her usual self, her daughter agrees.  This new hypotension is unusual, possible related factors could be a change in the timing of taking her medications ( this was an afternoon visit and her daughter notes that the beta blocker had been taken just prior) possibly complicated by fluid losses and dehydration related to the new medication jardiance. Today we will recheck her renal function to evaluate for improvement of her AKI and reduce the dose of the carvedilol.  - decrease carvedilol to 12.5 mg BID  - follow up BMP today   See Encounters Tab for problem based charting.  Patient discussed with Dr. Daryll Drown

## 2017-12-15 LAB — BMP8+ANION GAP
ANION GAP: 17 mmol/L (ref 10.0–18.0)
BUN/Creatinine Ratio: 20 (ref 12–28)
BUN: 22 mg/dL (ref 8–27)
CALCIUM: 9 mg/dL (ref 8.7–10.3)
CO2: 22 mmol/L (ref 20–29)
Chloride: 108 mmol/L — ABNORMAL HIGH (ref 96–106)
Creatinine, Ser: 1.12 mg/dL — ABNORMAL HIGH (ref 0.57–1.00)
GFR calc Af Amer: 53 mL/min/{1.73_m2} — ABNORMAL LOW (ref >59)
GFR, EST NON AFRICAN AMERICAN: 46 mL/min/{1.73_m2} — AB (ref >59)
Glucose: 148 mg/dL — ABNORMAL HIGH (ref 65–99)
POTASSIUM: 4.2 mmol/L (ref 3.5–5.2)
Sodium: 147 mmol/L — ABNORMAL HIGH (ref 134–144)

## 2017-12-15 NOTE — Progress Notes (Signed)
Internal Medicine Clinic Attending  Case discussed with Dr. Blum at the time of the visit.  We reviewed the resident's history and exam and pertinent patient test results.  I agree with the assessment, diagnosis, and plan of care documented in the resident's note. 

## 2017-12-22 DIAGNOSIS — H903 Sensorineural hearing loss, bilateral: Secondary | ICD-10-CM | POA: Diagnosis not present

## 2018-01-04 ENCOUNTER — Other Ambulatory Visit: Payer: Self-pay

## 2018-01-04 ENCOUNTER — Encounter: Payer: Self-pay | Admitting: Internal Medicine

## 2018-01-04 ENCOUNTER — Ambulatory Visit (INDEPENDENT_AMBULATORY_CARE_PROVIDER_SITE_OTHER): Payer: Medicare Other | Admitting: Internal Medicine

## 2018-01-04 ENCOUNTER — Encounter (INDEPENDENT_AMBULATORY_CARE_PROVIDER_SITE_OTHER): Payer: Self-pay

## 2018-01-04 VITALS — BP 116/52 | HR 82 | Temp 98.3°F | Ht 64.0 in | Wt 199.6 lb

## 2018-01-04 DIAGNOSIS — Z902 Acquired absence of lung [part of]: Secondary | ICD-10-CM

## 2018-01-04 DIAGNOSIS — K219 Gastro-esophageal reflux disease without esophagitis: Secondary | ICD-10-CM

## 2018-01-04 DIAGNOSIS — G8929 Other chronic pain: Secondary | ICD-10-CM

## 2018-01-04 DIAGNOSIS — Z8719 Personal history of other diseases of the digestive system: Secondary | ICD-10-CM

## 2018-01-04 DIAGNOSIS — G40909 Epilepsy, unspecified, not intractable, without status epilepticus: Secondary | ICD-10-CM

## 2018-01-04 DIAGNOSIS — Z86011 Personal history of benign neoplasm of the brain: Secondary | ICD-10-CM

## 2018-01-04 DIAGNOSIS — D509 Iron deficiency anemia, unspecified: Secondary | ICD-10-CM

## 2018-01-04 DIAGNOSIS — I739 Peripheral vascular disease, unspecified: Secondary | ICD-10-CM

## 2018-01-04 DIAGNOSIS — I110 Hypertensive heart disease with heart failure: Secondary | ICD-10-CM | POA: Diagnosis not present

## 2018-01-04 DIAGNOSIS — Z85110 Personal history of malignant carcinoid tumor of bronchus and lung: Secondary | ICD-10-CM

## 2018-01-04 DIAGNOSIS — M25569 Pain in unspecified knee: Secondary | ICD-10-CM

## 2018-01-04 DIAGNOSIS — E1159 Type 2 diabetes mellitus with other circulatory complications: Secondary | ICD-10-CM | POA: Diagnosis not present

## 2018-01-04 DIAGNOSIS — F329 Major depressive disorder, single episode, unspecified: Secondary | ICD-10-CM

## 2018-01-04 DIAGNOSIS — I10 Essential (primary) hypertension: Secondary | ICD-10-CM | POA: Diagnosis not present

## 2018-01-04 DIAGNOSIS — E118 Type 2 diabetes mellitus with unspecified complications: Secondary | ICD-10-CM | POA: Diagnosis not present

## 2018-01-04 DIAGNOSIS — E1165 Type 2 diabetes mellitus with hyperglycemia: Secondary | ICD-10-CM

## 2018-01-04 DIAGNOSIS — E1140 Type 2 diabetes mellitus with diabetic neuropathy, unspecified: Secondary | ICD-10-CM

## 2018-01-04 DIAGNOSIS — IMO0002 Reserved for concepts with insufficient information to code with codable children: Secondary | ICD-10-CM

## 2018-01-04 DIAGNOSIS — I5030 Unspecified diastolic (congestive) heart failure: Secondary | ICD-10-CM | POA: Diagnosis not present

## 2018-01-04 DIAGNOSIS — Z6834 Body mass index (BMI) 34.0-34.9, adult: Secondary | ICD-10-CM

## 2018-01-04 DIAGNOSIS — Z79899 Other long term (current) drug therapy: Secondary | ICD-10-CM

## 2018-01-04 DIAGNOSIS — E1142 Type 2 diabetes mellitus with diabetic polyneuropathy: Secondary | ICD-10-CM | POA: Diagnosis not present

## 2018-01-04 DIAGNOSIS — E669 Obesity, unspecified: Secondary | ICD-10-CM

## 2018-01-04 DIAGNOSIS — Z794 Long term (current) use of insulin: Secondary | ICD-10-CM

## 2018-01-04 DIAGNOSIS — E785 Hyperlipidemia, unspecified: Secondary | ICD-10-CM

## 2018-01-04 MED ORDER — OMEPRAZOLE 20 MG PO CPDR: 20.0000 mg | DELAYED_RELEASE_CAPSULE | ORAL | 3 refills | Status: DC | PRN

## 2018-01-04 MED ORDER — CARVEDILOL 6.25 MG PO TABS: 6.2500 mg | ORAL_TABLET | Freq: Two times a day (BID) | ORAL | 0 refills | Status: DC

## 2018-01-04 NOTE — Progress Notes (Addendum)
CC: follow up of hypertension   HPI:  [name] is a 84 y.o. PMHG1DD, uncontrolled insulin-dependent T2DM w peripheral neuropathy, HTN, HLD, iron deficiency anemia, adenocarcinoma of the lung (stage 1B, +EGFR s/p lobectomy w lymph node dissection), hx of meningioma, seizure disorder, GERD, severe obesity, depressionwho presents for follow up of hypertension.   Please see the assessment and plans for the status of the patient chronic medical problems.  Past Medical History:  Diagnosis Date  . Adenocarcinoma of lung ([place])     Right upper lobe adenocarcinoma. s/p right lower lobectomy 12/15/10  . ARTHRITIS, KNEE 03/24/2006  . CHF (congestive heart failure) ([place])   . CVA (cerebrovascular accident) ([place]) 0254    right embolic stroke, no residual deficits  . Depression 12/02/2010  . DIABETES MELLITUS, TYPE II 2000  . Diverticulitis   . GERD 12/04/2005  . Hemangioma of liver 12/02/2010  . HYPERLIPIDEMIA 12/04/2005  . HYPERTENSION 12/04/2005  . Meningioma ([place]) 07/21/2010   Review of Systems:  Refer to history of present illness and assessment and plans for pertinent review of systems, all others reviewed and negative  Physical Exam:  Vitals:   01/04/18 1418  BP: (!) 116/52  Pulse: 82  Temp: 98.3 F (36.8 C)  TempSrc: Oral  SpO2: 98%  Weight: 199 lb 9.6 oz (90.5 kg)  Height: _0  (1.626 m)   General: well appearing, no acute distress  Cardiac: regular rate and rhythm, no murmurs rubs or gallops, no peripheral edema  Pulm: normal work of breathing, lungs are clear to auscultation   Assessment & Plan:   Diabetes  Patient is here for diabetes follow up. Diabetes is not controlled at goal <8. Last A1c was 12, at the time Jardiance 10 mg qd was added to her lantus 30 units daily. She has had no side effects related to the [place]. She has a meter with her today, blood glucose has consistently been checked around 11 am when she wakes up for the day and varies  significantly. The average blood glucose is 136 with highest blood glucose 258 and one hypoglycemic reading of 66. She thinks the elevated blood glucose may be related to having sweets the evenings prior.  - start xultophy 10 units daily, consider increase at follow up  - Stop lantus 30 units daily   - continue Jardiance 10 mg daily, consider increase at follow up   - follow up A1c in two months, if A1c remains elevated we could consider adding a short acting sulfonylurea - will defer urine microalbumin testing until the blood glucose is under better control  - encouraged to scheduled ophthalmology follow up  - ASCVD 10 year risk 29%, last month she was found to have an LDL 187 and moderate intensity simvastatin 20 mg was increased to high intensity rosuvastatin 20 mg daily. Todays lipid panel shows improvement in the LDL to 108. I would like to see if increasing the rosuvastatin dose to 40 mg daily could help in getting [place] closer to goal but because we are already making other medication changes today I will delay this for now. We are medically managing PAD so stringent control of the hyperlipidemia is ideal.   Addendum: Xultophy not covered by insurance, required a prior authorization and they requested the use of soliqua instead. I will switch the prescription soliqua 30 units daily.   Hypertension  At last office visit the carvedilol was decreased to 12.5 mg twice daily from 25 mg BID. Today's appointment was  scheduled for follow up of blood pressure and blood pressure log check. Home blood pressure log reveals that the blood pressure is elevated above goal at home. Fortunately her symptoms of orthostatic hypertension have resolved. Will continue this attempt to taper her beta blocker and switch to an ACE inhibitor.  - continue to taper carvedilol to 6.25 mg BID  - start lisinopril 5 mg daily    Obesity  BMI 34 with multiple comorbidities including diabetes and hypertension. Ability to exercise  is limited by her chronic knee pain so we have focused on diet modifications.   GERD  Describes having symptoms of heartburn about once per month. She has gone without omeprazole for extended periods between refills in the past and did not have worsening of her symptoms. She does have a history of gastritis and a hiatal hernia on last EGD in 2012. They are open to transitioning to as needed omeprazole dosing.   Normocytic anemia  Hemoglobin 10.6 with MCV 83 noted on labwork obtained in the ED last month. She and her granddaughter deny noticing any sources of blood loss. She is on iron supplementation up until a couple of months ago, prior iron panels and MCV are not consistent with iron deficiency alone. She has had progressively worsening renal function which could be contributing in part to the anemia.  Follow up CBC and iron studies today revealed a normal hemoglobin and iron studies with a mixed picture of anemia of chronic disease ( elevated ferritin and low TIBS ) and some component of iron deficiency ( low t sat). Because her hemoglobin is stable we will keep her off of iron supplementation for now, if she does have progression of the CKD and requires epo she will need to resume iron supplementation.   See Encounters Tab for problem based charting.  Patient discussed with Dr. Eppie Gibson

## 2018-01-04 NOTE — Assessment & Plan Note (Signed)
At last office visit the carvedilol was decreased to 12.5 mg twice daily from 25 mg BID. Today's appointment was scheduled for follow up of blood pressure and blood pressure log check. Home blood pressure log reveals that the blood pressure is elevated above goal at home. Fortunately her symptoms of orthostatic hypertension have resolved. Will continue this attempt to taper her beta blocker and switch to an ACE inhibitor.  - continue to taper carvedilol to 6.25 mg BID  - start lisinopril 5 mg daily

## 2018-01-04 NOTE — Assessment & Plan Note (Addendum)
Hemoglobin 10.6 with MCV 83 noted on labwork obtained in the ED last month. She and her granddaughter deny noticing any sources of blood loss. She is on iron supplementation up until a couple of months ago, prior iron panels and MCV are not consistent with iron deficiency alone. She has had progressively worsening renal function which could be contributing in part to the anemia.  Follow up CBC and iron studies today revealed a normal hemoglobin and iron studies with a mixed picture of anemia of chronic disease ( elevated ferritin and low TIBS ) and some component of iron deficiency ( low t sat). Because her hemoglobin is stable we will keep her off of iron supplementation for now, if she does have progression of the CKD and requires epo she will need to resume iron supplementation.

## 2018-01-04 NOTE — Patient Instructions (Addendum)
Thank you for coming to the clinic today. It was a pleasure to see you.   Take the omeprazole only as needed when you have heart burn  Decrease the carvedilol to 6.25 mg twice daily  Start taking lisinopril 5 mg daily  Please schedule an eye exam with your ophthalmologist Dr. Manuella Ghazi and ask them to send [place] the results.   FOLLOW-UP INSTRUCTIONS When: 2 months with Dr. Hetty Ely  Bring all of your medication bottles  Bring your blood glucose meter  Bring your blood pressure monitor  Bring your blood pressure recordings   Please call the internal medicine center clinic if you have any questions or concerns, we may be able to help and keep you from a long and expensive emergency room wait. Our clinic and after hours phone number is [phone], the best time to call is Monday through Friday 9 am to 4 pm but there is always someone available 24/7 if you have an emergency. If you need medication refills please notify your pharmacy one week in advance and they will send [place] a request.

## 2018-01-04 NOTE — Assessment & Plan Note (Signed)
BMI 34 with multiple comorbidities including diabetes and hypertension. Ability to exercise is limited by her chronic knee pain so we have focused on diet modifications.

## 2018-01-04 NOTE — Assessment & Plan Note (Signed)
Describes having symptoms of heartburn about once per month. She has gone without omeprazole for extended periods between refills in the past and did not have worsening of her symptoms. She does have a history of gastritis and a hiatal hernia on last EGD in 2012. They are open to transitioning to as needed omeprazole dosing.

## 2018-01-04 NOTE — Assessment & Plan Note (Addendum)
Patient is here for diabetes follow up. Diabetes is not controlled at goal <8. Last A1c was 12, at the time Jardiance 10 mg qd was added to her lantus 30 units daily. She has had no side effects related to the [place]. She has a meter with her today, blood glucose has consistently been checked around 11 am when she wakes up for the day and varies significantly. The average blood glucose is 136 with highest blood glucose 258 and one hypoglycemic reading of 66. She thinks the elevated blood glucose may be related to having sweets the evenings prior.  - start xultophy 10 units daily, consider increase at follow up  - Stop lantus 30 units daily   - continue Jardiance 10 mg daily, consider increase at follow up   - follow up A1c in two months, if A1c remains elevated we could consider adding a short acting sulfonylurea - will defer urine microalbumin testing until the blood glucose is under better control  - encouraged to scheduled ophthalmology follow up  - ASCVD 10 year risk 29%, last month she was found to have an LDL 187 and moderate intensity simvastatin 20 mg was increased to high intensity rosuvastatin 20 mg daily. Todays lipid panel shows improvement in the LDL to 108. I would like to see if increasing the rosuvastatin dose to 40 mg daily could help in getting [place] closer to goal but because we are already making other medication changes today I will delay this for now. We are medically managing PAD so stringent control of the hyperlipidemia is ideal.

## 2018-01-05 LAB — IRON AND TIBC
IRON SATURATION: 13 % — AB (ref 15–55)
IRON: 32 ug/dL (ref 27–139)
Total Iron Binding Capacity: 241 ug/dL — ABNORMAL LOW (ref 250–450)
UIBC: 209 ug/dL (ref 118–369)

## 2018-01-05 LAB — FERRITIN: FERRITIN: 50 ng/mL (ref 15–150)

## 2018-01-05 LAB — CBC
HEMOGLOBIN: 11.4 g/dL (ref 11.1–15.9)
Hematocrit: 36.9 % (ref 34.0–46.6)
MCH: 24.7 pg — ABNORMAL LOW (ref 26.6–33.0)
MCHC: 30.9 g/dL — AB (ref 31.5–35.7)
MCV: 80 fL (ref 79–97)
Platelets: 316 10*3/uL (ref 150–450)
RBC: 4.61 x10E6/uL (ref 3.77–5.28)
RDW: 15.7 % — AB (ref 12.3–15.4)
WBC: 6.3 10*3/uL (ref 3.4–10.8)

## 2018-01-05 LAB — LIPID PANEL
Chol/HDL Ratio: 3.8 ratio (ref 0.0–4.4)
Cholesterol, Total: 180 mg/dL (ref 100–199)
HDL: 47 mg/dL (ref >39)
LDL CALC: 108 mg/dL — AB (ref 0–99)
TRIGLYCERIDES: 125 mg/dL (ref 0–149)
VLDL Cholesterol Cal: 25 mg/dL (ref 5–40)

## 2018-01-11 ENCOUNTER — Encounter: Payer: Self-pay | Admitting: Internal Medicine

## 2018-01-11 NOTE — Progress Notes (Signed)
Patient ID: [name], female   DOB: Apr 21, 1933, 84 y.o.   MRN: [id number]  Case discussed with Dr. Hetty Ely at the time of the visit.  We reviewed the resident's history and exam and pertinent patient test results.  I agree with the assessment, diagnosis, and plan of care documented in the resident's note.  Patient no longer qualifies for the diagnosis of morbid obesity given her BMI is < 35, despite the fact that she has comorbidities.

## 2018-01-12 MED ORDER — INSULIN DEGLUDEC-LIRAGLUTIDE 100-3.6 UNIT-MG/ML ~~LOC~~ SOPN: 10.0000 [IU] | PEN_INJECTOR | Freq: Every day | SUBCUTANEOUS | 1 refills | Status: DC

## 2018-01-12 NOTE — Addendum Note (Signed)
Addended by: Meryl Dare on: 01/12/2018 02:19 PM   Modules accepted: Orders

## 2018-01-18 ENCOUNTER — Telehealth: Payer: Self-pay

## 2018-01-18 NOTE — Telephone Encounter (Signed)
Daughter stated she was not given the xultophy at the pharm, [facility] spoke to pharmacist, will need a PA for the xultophy, please call [phone]. Sending to dr blum and Hortencia Conradi.

## 2018-01-18 NOTE — Telephone Encounter (Signed)
Pt's daughter requesting to speak with a nurse about med. Please call pt back.

## 2018-01-20 NOTE — Telephone Encounter (Signed)
I agree, thank you.

## 2018-01-27 ENCOUNTER — Telehealth: Payer: Self-pay | Admitting: *Deleted

## 2018-01-27 NOTE — Telephone Encounter (Addendum)
Call to [facility] for PA for Xultophy. Medication is not covered under patient's plan.  Soliqua,Tresiba or [place] Flextouch.  Forms to be sent to complete for PA if Xultophy is needed. To be placed in Dr. Fredrik Cove box.  Message to be sent to Dr. Hetty Ely to review.  Sander Nephew, RN 01/27/2018 9:47 AM  Additional forms were received and placed in Dr. Fredrik Cove box for review.  Sander Nephew, RN 01/28/2018 2:47PM.   [name] was changed to [facility].  Patient's daughter [name] was told hat patient's Xultophy had been changed to [place] and that when patient starts the [facility] to stop the Lantus per Dr. Hetty Ely. [name] voiced understanding of the change and will pick up from Pharmacy and to stop the Lantus when the [facility] is started.  Sander Nephew, RN 02/01/2018 10:49 AM.

## 2018-01-31 MED ORDER — INSULIN GLARGINE-LIXISENATIDE 100-33 UNT-MCG/ML ~~LOC~~ SOPN: 30.0000 [IU] | PEN_INJECTOR | Freq: Every day | SUBCUTANEOUS | 6 refills | Status: DC

## 2018-01-31 NOTE — Addendum Note (Signed)
Addended by: Meryl Dare on: 01/31/2018 08:02 AM   Modules accepted: Orders

## 2018-01-31 NOTE — Telephone Encounter (Signed)
Thank you for letting me know [name]. Ive switched the prescription to [name]. Please call and let [name] and her granddaughter know about the change. Once they have picked up the soliqua they can stop using the lantus. Thank you!

## 2018-02-05 ENCOUNTER — Other Ambulatory Visit: Payer: Self-pay | Admitting: Internal Medicine

## 2018-02-05 DIAGNOSIS — I10 Essential (primary) hypertension: Secondary | ICD-10-CM

## 2018-02-07 NOTE — Telephone Encounter (Signed)
Next appt scheduled  03/22/18 with PCP.

## 2018-02-18 NOTE — Telephone Encounter (Signed)
Today I spoke with [name], they were able to obtain the soliqua from the pharmacy last week. Since that time she has been eating fine but has had three episodes of vomiting. [name] believes the vomiting may be related to the [facility] and that the episodes seem to be improving but Ms. Date did vomit once last night. I agreed that vomiting could be related to the soliqua and asked if [name] was okay with continuing the medication and monitoring [name] closely for improvement. [name] will call to update [place] if the vomiting does not continue to improve. They will also bring [name] blood glucose log to her next visit so that we can adjust her soliqua dose.

## 2018-03-02 ENCOUNTER — Other Ambulatory Visit: Payer: Self-pay | Admitting: Internal Medicine

## 2018-03-02 DIAGNOSIS — I739 Peripheral vascular disease, unspecified: Secondary | ICD-10-CM

## 2018-03-15 ENCOUNTER — Other Ambulatory Visit: Payer: Self-pay

## 2018-03-15 NOTE — Patient Outreach (Signed)
[facility]) Care Management  03/15/2018  [name] Nov 21, 1933 [id number]   Medication Adherence call to [name] patient pick up Lisinopril 20 mg and Rosuvastatin 20 mg on 03/14/18 [facility] patient will be due again on April/2020. [name] is showing past due [name].   [place] Management Direct Dial [phone]  Fax [phone] [name]

## 2018-03-21 NOTE — Progress Notes (Signed)
CC: nausea and vomiting   HPI:  [name] is a 85 y.o. with PMH G1DD, uncontrolled insulin-dependent T2DM w peripheral neuropathy, HTN, HLD, iron deficiency anemia, adenocarcinoma of the lung (stage 1B, +EGFR s/p lobectomy w lymph node dissection), hx of meningioma, seizure disorder, GERD, severe obesity, depression who presents for evaluation of nausea and vomiting. Please see the assessment and plans for the status of the patient chronic medical problems.   Review of Systems:  Refer to history of present illness and assessment and plans for pertinent review of systems, all others reviewed and negative  Physical Exam:  Vitals:   03/22/18 1539  BP: (!) 127/56  Pulse: 91  Temp: 97.6 F (36.4 C)  TempSrc: Oral  SpO2: 98%  Weight: 194 lb 4.8 oz (88.1 kg)  Height: '5\' 4"'  (1.626 m)   General: well appearing, no acute distress Eyes: no conjunctivitis, no jaundice  Cardiac: regular rate and rhythm, no murmurs, rubs, or gallops, no peripheral edema  Pulm: lungs are clear to auscultation, no wheezes or rhonchi  GI: bowel sounds are early and hyperactive, the abdomen is soft, non tender, non distended  Skin: no rashes evident on the exposed skin   Assessment & Plan:   DM  Peripheral arterial disease  Hyperlipidemia associated with Diabetes  Patient presents for follow up of diabetes. Last A1c 12.4 when collected about four months ago. She is currently prescribed Jardiance 10 mg qd and soliqua 30 units qd. The soliqua was a change from lantus and started after the last A1c, today her A1c is improved to 6.4. Barriers include her dislike of daily injections and GFR <60 making oral medications not an option.   She has had one low blood sugar in the 70s, she is noticeably fatigued and appears dehydrated when this happens.   If GFR is found sustained less than 45 she will have to stop taking the Jardiance. Ideally I would like to increase [name]'s crestor 20 mg dose to a higher  intensity 40 mg dose in hopes that this would help with [name] has struggled with medication side effects in the past and as we are prioritizing controlling her diabetes I will hold off on this change.   - obtain urine microalbumin testing - encouraged to scheduled ophthalmology follow up  - referral to GI for gastric emptying study  - reduce soliqua to 20 mg daily  - foot exam performed today - good pulses, warm, no concerning lesions or skin changes  - urine microalbumin/crt   Nausea and vomiting  [name] has daily nausea and vomiting. The vomiting always follows eating food but is delayed about an hour after she eats. The vomitus contains the food that she ate, no blood.She has an intermittent globus sensation, denies dysphagia or odynophagia. She does have dyspepsia at times. Recordings of weight have not been significantly reduced. She does not have constipation, melena, or hematochezia.  Last EGD 2012 mild gastritis in the antrum, stricture at the GE junction which was dilated, hiatal hernia. Biopsies taken for Hpylori. Results of a test called "urease" were negative.   This may be representative of gastroparesis given history of uncontrolled diabetes however she has a history of gastritis and stricture at the GE junction requiring dilation. She was last seen with [place] [facility] in 2012.  -continue omeprazole 20 mg daily  - follow up with [facility]   Hypertension  Patients blood pressure is well controlled. Currently prescribed carvedilol 6.25 mg BID. Lisinopril 5  mg was started at last office visit three months ago.  - continue lisinopril 5 mg qd and carvedilol 6.25 mg BID   Depression  PHQ 9 score is 3. She reports that depression is well controlled.  - continue fluoxetine 10 mg qd   See Encounters Tab for problem based charting.  Patient discussed with Dr. Angelia Mould

## 2018-03-22 ENCOUNTER — Encounter: Payer: Self-pay | Admitting: Internal Medicine

## 2018-03-22 ENCOUNTER — Ambulatory Visit (INDEPENDENT_AMBULATORY_CARE_PROVIDER_SITE_OTHER): Payer: Medicare Other | Admitting: Internal Medicine

## 2018-03-22 ENCOUNTER — Other Ambulatory Visit: Payer: Self-pay

## 2018-03-22 VITALS — BP 127/56 | HR 91 | Temp 97.6°F | Ht 64.0 in | Wt 194.3 lb

## 2018-03-22 DIAGNOSIS — Z794 Long term (current) use of insulin: Secondary | ICD-10-CM

## 2018-03-22 DIAGNOSIS — F3342 Major depressive disorder, recurrent, in full remission: Secondary | ICD-10-CM

## 2018-03-22 DIAGNOSIS — Z86011 Personal history of benign neoplasm of the brain: Secondary | ICD-10-CM

## 2018-03-22 DIAGNOSIS — I10 Essential (primary) hypertension: Secondary | ICD-10-CM

## 2018-03-22 DIAGNOSIS — E1169 Type 2 diabetes mellitus with other specified complication: Secondary | ICD-10-CM | POA: Diagnosis not present

## 2018-03-22 DIAGNOSIS — E785 Hyperlipidemia, unspecified: Secondary | ICD-10-CM | POA: Diagnosis not present

## 2018-03-22 DIAGNOSIS — D509 Iron deficiency anemia, unspecified: Secondary | ICD-10-CM

## 2018-03-22 DIAGNOSIS — Z79899 Other long term (current) drug therapy: Secondary | ICD-10-CM

## 2018-03-22 DIAGNOSIS — E1159 Type 2 diabetes mellitus with other circulatory complications: Secondary | ICD-10-CM | POA: Diagnosis not present

## 2018-03-22 DIAGNOSIS — C3490 Malignant neoplasm of unspecified part of unspecified bronchus or lung: Secondary | ICD-10-CM

## 2018-03-22 DIAGNOSIS — G40909 Epilepsy, unspecified, not intractable, without status epilepticus: Secondary | ICD-10-CM

## 2018-03-22 DIAGNOSIS — E1165 Type 2 diabetes mellitus with hyperglycemia: Secondary | ICD-10-CM | POA: Diagnosis not present

## 2018-03-22 DIAGNOSIS — R112 Nausea with vomiting, unspecified: Secondary | ICD-10-CM

## 2018-03-22 DIAGNOSIS — D1803 Hemangioma of intra-abdominal structures: Secondary | ICD-10-CM

## 2018-03-22 DIAGNOSIS — F339 Major depressive disorder, recurrent, unspecified: Secondary | ICD-10-CM

## 2018-03-22 DIAGNOSIS — IMO0002 Reserved for concepts with insufficient information to code with codable children: Secondary | ICD-10-CM

## 2018-03-22 DIAGNOSIS — K219 Gastro-esophageal reflux disease without esophagitis: Secondary | ICD-10-CM

## 2018-03-22 DIAGNOSIS — E1142 Type 2 diabetes mellitus with diabetic polyneuropathy: Secondary | ICD-10-CM

## 2018-03-22 DIAGNOSIS — E1140 Type 2 diabetes mellitus with diabetic neuropathy, unspecified: Secondary | ICD-10-CM

## 2018-03-22 DIAGNOSIS — Z6833 Body mass index (BMI) 33.0-33.9, adult: Secondary | ICD-10-CM

## 2018-03-22 LAB — POCT GLYCOSYLATED HEMOGLOBIN (HGB A1C): Hemoglobin A1C: 6.4 % — AB (ref 4.0–5.6)

## 2018-03-22 LAB — GLUCOSE, CAPILLARY: GLUCOSE-CAPILLARY: 148 mg/dL — AB (ref 70–99)

## 2018-03-22 NOTE — Patient Instructions (Signed)
Thank you for coming to the clinic today. It was a pleasure to see you.   For your diabetes, please reduce the dose of your soliqua to 20 mg daily   FOLLOW-UP INSTRUCTIONS When: 3 months with Dr. Hetty Ely  For: follow up of the diabetes  What to bring: all of your medication bottles   Please call the internal medicine center clinic if you have any questions or concerns, we may be able to help and keep you from a long and expensive emergency room wait. Our clinic and after hours phone number is [phone], the best time to call is Monday through Friday 9 am to 4 pm but there is always someone available 24/7 if you have an emergency. If you need medication refills please notify your pharmacy one week in advance and they will send [place] a request.

## 2018-03-23 LAB — MICROALBUMIN / CREATININE URINE RATIO
Creatinine, Urine: 150.5 mg/dL
MICROALB/CREAT RATIO: 25 mg/g{creat} (ref 0–29)
MICROALBUM., U, RANDOM: 38 ug/mL

## 2018-03-24 ENCOUNTER — Encounter: Payer: Self-pay | Admitting: Internal Medicine

## 2018-03-24 MED ORDER — OMEPRAZOLE 20 MG PO CPDR: 20.0000 mg | DELAYED_RELEASE_CAPSULE | Freq: Every day | ORAL | 3 refills | Status: DC

## 2018-03-24 NOTE — Assessment & Plan Note (Signed)
PHQ 9 score is 3. She reports that depression is well controlled.  - continue fluoxetine 10 mg qd

## 2018-03-24 NOTE — Assessment & Plan Note (Signed)
[name] has daily nausea and vomiting. The vomiting always follows eating food but is delayed about an hour after she eats. The vomitus contains the food that she ate, no blood.She has an intermittent globus sensation, denies dysphagia or odynophagia. She does have dyspepsia at times. Recordings of weight have not been significantly reduced. She does not have constipation, melena, or hematochezia.  Last EGD 2012 mild gastritis in the antrum, stricture at the GE junction which was dilated, hiatal hernia. Biopsies taken for Hpylori. Results of a test called "urease" were negative.   This may be representative of gastroparesis given history of uncontrolled diabetes however she has a history of gastritis and stricture at the GE junction requiring dilation. She was last seen with [facility] in 2012.  -continue omeprazole 20 mg daily  - follow up with [facility]

## 2018-03-24 NOTE — Assessment & Plan Note (Signed)
Patients blood pressure is well controlled. Currently prescribed carvedilol 6.25 mg BID. Lisinopril 5 mg was started at last office visit three months ago.  - continue lisinopril 5 mg qd and carvedilol 6.25 mg BID

## 2018-03-24 NOTE — Assessment & Plan Note (Signed)
Patient presents for follow up of diabetes. Last A1c 12.4 when collected about four months ago. She is currently prescribed Jardiance 10 mg qd and soliqua 30 units qd. The soliqua was a change from lantus and started after the last A1c, today her A1c is improved to 6.4. Barriers include her dislike of daily injections and GFR <60 making oral medications not an option.   She has had one low blood sugar in the 70s, she is noticeably fatigued and appears dehydrated when this happens.   If GFR is found sustained less than 45 she will have to stop taking the Jardiance. Ideally I would like to increase [name]'s crestor 20 mg dose to a higher intensity 40 mg dose in hopes that this would help with [name] has struggled with medication side effects in the past and as we are prioritizing controlling her diabetes I will hold off on this change.   - obtain urine microalbumin testing - encouraged to scheduled ophthalmology follow up  - referral to GI for gastric emptying study  - reduce soliqua to 20 mg daily  - foot exam performed today

## 2018-03-25 NOTE — Progress Notes (Signed)
Internal Medicine Clinic Attending  Case discussed with Dr. Blum at the time of the visit.  We reviewed the resident's history and exam and pertinent patient test results.  I agree with the assessment, diagnosis, and plan of care documented in the resident's note. 

## 2018-04-06 ENCOUNTER — Other Ambulatory Visit: Payer: Self-pay | Admitting: Internal Medicine

## 2018-04-21 ENCOUNTER — Other Ambulatory Visit: Payer: Self-pay | Admitting: Internal Medicine

## 2018-04-21 DIAGNOSIS — I739 Peripheral vascular disease, unspecified: Secondary | ICD-10-CM

## 2018-05-02 ENCOUNTER — Ambulatory Visit: Payer: Medicare Other | Admitting: Gastroenterology

## 2018-05-02 NOTE — Progress Notes (Deleted)
[facility] Consult Note:  History: [name] 05/02/2018  Referring physician: [name], MD  Reason for consult/chief complaint: No chief complaint on file.   Subjective  HPI:  *** Recently seen in medical resident clinic with chronic nausea and vomiting of undigested food.  She has poorly controlled diabetes with complications thereof, as outlined in that office note.  She was referred to [place] the symptoms and also because of 2012 work-up by Dr. 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  ROS:  Review of Systems   Past Medical History: Past Medical History:  Diagnosis Date   Adenocarcinoma of lung ([place])     Right upper lobe adenocarcinoma. s/p right lower lobectomy 12/15/10   ARTHRITIS, KNEE 03/24/2006   CHF (congestive heart failure) ([place])    CVA (cerebrovascular accident) ([place]) 7939    right embolic stroke, no residual deficits   Depression 12/02/2010   DIABETES MELLITUS, TYPE II 2000   Diverticulitis    GERD 12/04/2005   Hemangioma of liver 12/02/2010   HYPERLIPIDEMIA 12/04/2005   HYPERTENSION 12/04/2005   Meningioma ([place]) 07/21/2010     Past Surgical History: Past Surgical History:  Procedure Laterality Date   ABDOMINAL HYSTERECTOMY     CRANIOTOMY N/A 03/02/2013   Procedure: CRANIOTOMY TUMOR EXCISION;  Surgeon: Winfield Cunas, MD;  Location: [facility];  Service: Neurosurgery;  Laterality: N/A;  Bifrontal Craniotomy for tumor   Extracapsular cataract extraction with intraocular     lens implantation.   INCISION AND DRAINAGE PERIRECTAL ABSCESS N/A 01/19/2013   Procedure: IRRIGATION AND DEBRIDEMENT PERIRECTAL ABSCESS;  Surgeon: Zenovia Jarred, MD;  Location: [facility];  Service: General;  Laterality: N/A;   RIGHT VATS,RIGHT THORACOTOMY,RIGHT LOWER LOBECTOMY WITH NODE DISSECTION     Video bronchoscope.  12/29/2007   [place]   Wide excision of left upper  back mass.       Family History: Family History  Problem Relation Age of Onset   Hyperlipidemia Brother    Hypertension Brother    Diabetes Brother     Social History: Social History   Socioeconomic History   Marital status: Widowed    Spouse name: Not on file   Number of children: 5   Years of education: 10   Highest education level: Not on file  Occupational History    Employer: UNEMPLOYED  Social Designer, fashion/clothing strain: Not on file   Food insecurity:    Worry: Not on file    Inability: Not on file   Transportation needs:    Medical: Not on file    Non-medical: Not on file  Tobacco Use   Smoking status: Former Smoker    Types: Cigarettes    Last attempt to quit: 02/17/2000    Years since quitting: 18.2   Smokeless tobacco: Never Used  Substance and Sexual Activity   Alcohol use: No    Alcohol/week: 0.0 standard drinks   Drug use: No   Sexual activity: Not Currently  Lifestyle   Physical activity:    Days per week: Not on file    Minutes per session: Not on file   Stress: Not on file  Relationships   Social connections:    Talks on phone: Not on file    Gets together: Not on file    Attends religious service: Not on file    Active member of club or organization: Not on file    Attends meetings  of clubs or organizations: Not on file    Relationship status: Not on file  Other Topics Concern   Not on file  Social History Narrative   Patient requests to use [employer] [facility] for her diabetes testing supplies as of 09/05/2009.   Lives at home with daughter, [name].   Caffeine use: 1 cup coffee/day    Allergies: No Known Allergies  Outpatient Meds: Current Outpatient Medications  Medication Sig Dispense Refill   ACCU-CHEK FASTCLIX LANCETS MISC CHECK BLOOD SUGAR THREE TIMES A DAY. DIAG CODE E11.9. INSULIN DEPENDENT 306 each 11   acetaminophen-codeine (TYLENOL #3) 300-30 MG tablet TAKE 1 TABLET BY MOUTH DAILY AS  NEEDED FOR MODERATE PAIN OR SEVERE PAIN. 30 tablet 3   aspirin EC 81 MG tablet Take 81 mg by mouth daily.     Blood Glucose Monitoring Suppl (ACCU-CHEK GUIDE) w/Device KIT 1 each by Does not apply route 3 (three) times daily. 1 kit 0   carvedilol (COREG) 6.25 MG tablet TAKE 1 TABLET (6.25 MG TOTAL) BY MOUTH 2 (TWO) TIMES DAILY WITH A MEAL. 90 tablet 3   cilostazol (PLETAL) 100 MG tablet TAKE 1 TABLET BY MOUTH TWICE A DAY 180 tablet 1   diclofenac sodium (VOLTAREN) 1 % GEL Apply 4 g topically 4 (four) times daily as needed (pain). 1 Tube 6   FLUoxetine (PROZAC) 10 MG capsule TAKE 1 CAPSULE (10 MG TOTAL) BY MOUTH DAILY. (Patient taking differently: Take 10 mg by mouth daily. ) 90 capsule 3   glucose blood (ACCU-CHEK SMARTVIEW) test strip check blood sugar 3x a day as instructed 100 each 12   Incontinence Supplies MISC For use to address bowel and bladder incontinence. 30 each 0   Insulin Glargine-Lixisenatide (SOLIQUA) 100-33 UNT-MCG/ML SOPN Inject 30 Units into the skin daily. 10 pen 6   Insulin Pen Needle 31G X 8 MM MISC Use to administer lantus once daily 100 each 11   iron polysaccharides (FERREX 150) 150 MG capsule TAKE 1 CAPSULE BY MOUTH EVERY DAY (Patient taking differently: Take 150 mg by mouth daily. ) 90 capsule 3   JARDIANCE 10 MG TABS tablet TAKE 1 TABLET BY MOUTH EVERY DAY 30 tablet 12   levETIRAcetam (KEPPRA) 750 MG tablet TAKE 1 TABLET (750 MG TOTAL) BY MOUTH 2 (TWO) TIMES DAILY. (Patient taking differently: Take 750 mg by mouth 2 (two) times daily. ) 180 tablet 3   omeprazole (PRILOSEC) 20 MG capsule Take 1 capsule (20 mg total) by mouth daily. For heartburn 30 capsule 3   rosuvastatin (CRESTOR) 20 MG tablet Take 1 tablet (20 mg total) by mouth daily. 90 tablet 3   No current facility-administered medications for this visit.       ___________________________________________________________________ Objective   Exam:  There were no vitals taken for this  visit.   General: ***   Eyes: sclera anicteric, no redness  ENT: oral mucosa moist without lesions, no cervical or supraclavicular lymphadenopathy  CV: RRR without murmur, S1/S2, no JVD, no peripheral edema  Resp: clear to auscultation bilaterally, normal RR and effort noted  GI: soft, *** tenderness, with active bowel sounds. No guarding or palpable organomegaly noted.  Skin; warm and dry, no rash or jaundice noted  Neuro: awake, alert and oriented x 3. Normal gross motor function and fluent speech  Labs:  Hemoglobin A1c 11.1 in December 2018, 12.4 in October 2019, 6.4 in February 2020.  CMP Latest Ref Rng & Units 12/14/2017 12/06/2017 11/23/2017  Glucose 65 - 99 mg/dL  148(H) 251(H) 327(H)  BUN 8 - 27 mg/dL '22 21 21  ' Creatinine 0.57 - 1.00 mg/dL 1.12(H) 1.62(H) 1.05(H)  Sodium 134 - 144 mmol/L 147(H) 141 139  Potassium 3.5 - 5.2 mmol/L 4.2 3.9 4.5  Chloride 96 - 106 mmol/L 108(H) 109 101  CO2 20 - 29 mmol/L '22 23 23  ' Calcium 8.7 - 10.3 mg/dL 9.0 8.3(L) 8.7  Total Protein 6.5 - 8.1 g/dL - 6.3(L) -  Total Bilirubin 0.3 - 1.2 mg/dL - 0.5 -  Alkaline Phos 38 - 126 U/L - 69 -  AST 15 - 41 U/L - 13(L) -  ALT 0 - 44 U/L - 9 -   CBC Latest Ref Rng & Units 01/04/2018 12/06/2017 02/01/2017  WBC 3.4 - 10.8 x10E3/uL 6.3 6.5 5.7  Hemoglobin 11.1 - 15.9 g/dL 11.4 10.6(L) 11.2  Hematocrit 34.0 - 46.6 % 36.9 37.1 36.7  Platelets 150 - 450 x10E3/uL 316 256 305    Radiologic Studies:  No gastric emptying study has been performed.   2012 upper endoscopy report by Dr. Boykin Reaper was reviewed.  There appears to be a small to moderate sized hiatal hernia.  It is not clear what degree of stricture is present on a single photo of the EG junction. Gastric biopsy for urease negative.  Assessment: No diagnosis found.  ***  Plan:  ***  Thank you for the courtesy of this consult.  Please call me with any questions or concerns.  Nelida Meuse III  CC: Referring provider noted  above

## 2018-05-02 NOTE — Addendum Note (Signed)
Addended by: Hulan Fray on: 05/02/2018 07:20 PM   Modules accepted: Orders

## 2018-05-21 ENCOUNTER — Other Ambulatory Visit: Payer: Self-pay | Admitting: Internal Medicine

## 2018-06-08 ENCOUNTER — Other Ambulatory Visit: Payer: Self-pay | Admitting: Internal Medicine

## 2018-06-08 DIAGNOSIS — G40909 Epilepsy, unspecified, not intractable, without status epilepticus: Secondary | ICD-10-CM

## 2018-07-05 ENCOUNTER — Encounter: Payer: Self-pay | Admitting: Internal Medicine

## 2018-07-05 ENCOUNTER — Ambulatory Visit (INDEPENDENT_AMBULATORY_CARE_PROVIDER_SITE_OTHER): Payer: Medicare Other | Admitting: Internal Medicine

## 2018-07-05 ENCOUNTER — Other Ambulatory Visit: Payer: Self-pay

## 2018-07-05 DIAGNOSIS — E1122 Type 2 diabetes mellitus with diabetic chronic kidney disease: Secondary | ICD-10-CM | POA: Diagnosis not present

## 2018-07-05 DIAGNOSIS — E1151 Type 2 diabetes mellitus with diabetic peripheral angiopathy without gangrene: Secondary | ICD-10-CM | POA: Diagnosis not present

## 2018-07-05 DIAGNOSIS — I130 Hypertensive heart and chronic kidney disease with heart failure and stage 1 through stage 4 chronic kidney disease, or unspecified chronic kidney disease: Secondary | ICD-10-CM

## 2018-07-05 DIAGNOSIS — Z79891 Long term (current) use of opiate analgesic: Secondary | ICD-10-CM

## 2018-07-05 DIAGNOSIS — E785 Hyperlipidemia, unspecified: Secondary | ICD-10-CM

## 2018-07-05 DIAGNOSIS — IMO0002 Reserved for concepts with insufficient information to code with codable children: Secondary | ICD-10-CM

## 2018-07-05 DIAGNOSIS — I5032 Chronic diastolic (congestive) heart failure: Secondary | ICD-10-CM

## 2018-07-05 DIAGNOSIS — G40909 Epilepsy, unspecified, not intractable, without status epilepticus: Secondary | ICD-10-CM

## 2018-07-05 DIAGNOSIS — Z79899 Other long term (current) drug therapy: Secondary | ICD-10-CM

## 2018-07-05 DIAGNOSIS — F329 Major depressive disorder, single episode, unspecified: Secondary | ICD-10-CM

## 2018-07-05 DIAGNOSIS — Z7982 Long term (current) use of aspirin: Secondary | ICD-10-CM

## 2018-07-05 DIAGNOSIS — F3342 Major depressive disorder, recurrent, in full remission: Secondary | ICD-10-CM

## 2018-07-05 DIAGNOSIS — E1169 Type 2 diabetes mellitus with other specified complication: Secondary | ICD-10-CM

## 2018-07-05 DIAGNOSIS — E1140 Type 2 diabetes mellitus with diabetic neuropathy, unspecified: Secondary | ICD-10-CM

## 2018-07-05 DIAGNOSIS — M1711 Unilateral primary osteoarthritis, right knee: Secondary | ICD-10-CM

## 2018-07-05 DIAGNOSIS — E1159 Type 2 diabetes mellitus with other circulatory complications: Secondary | ICD-10-CM

## 2018-07-05 DIAGNOSIS — N183 Chronic kidney disease, stage 3 (moderate): Secondary | ICD-10-CM | POA: Diagnosis not present

## 2018-07-05 DIAGNOSIS — I739 Peripheral vascular disease, unspecified: Secondary | ICD-10-CM

## 2018-07-05 DIAGNOSIS — Z794 Long term (current) use of insulin: Secondary | ICD-10-CM

## 2018-07-05 DIAGNOSIS — Z8673 Personal history of transient ischemic attack (TIA), and cerebral infarction without residual deficits: Secondary | ICD-10-CM

## 2018-07-05 MED ORDER — LOSARTAN POTASSIUM 25 MG PO TABS: 25.0000 mg | ORAL_TABLET | Freq: Every day | ORAL | 3 refills | Status: DC

## 2018-07-05 MED ORDER — ACETAMINOPHEN-CODEINE #3 300-30 MG PO TABS: 1.0000 | ORAL_TABLET | Freq: Every day | ORAL | 3 refills | Status: DC | PRN

## 2018-07-05 NOTE — Assessment & Plan Note (Signed)
Blood pressure has been well controlled on in office blood pressure checks. Blood pressure checked at home today is 149/76. Patient had vomiting which she thought may be related to lisinopril so she stopped taking this and vomiting improved.  - continue carvedilol 6.25 mg BID - d/c lisinopril, start losartan 25 mg daily  - RTC in one month for in person blood pressure check

## 2018-07-05 NOTE — Progress Notes (Signed)
CC: follow up of diabetes   This is a telephone encounter between [name] and [name] on 07/05/2018 for follow up of diabetes . The visit was conducted with the patient located at home and [name] at [facility]. The patient's identity was confirmed using their DOB and current address. The patient has consented to being evaluated through a telephone encounter and understands the associated risks (an examination cannot be done and the patient may need to come in for an appointment) / benefits (allows the patient to remain at home, decreasing exposure to coronavirus). I personally spent 26 minutes on medical discussion.   HPI:  [name] is a 85 y.o. with PMH as below.   Please see A&P for assessment of the patient's acute and chronic medical conditions.   Past Medical History:  Diagnosis Date  . Adenocarcinoma of lung ([place])     Right upper lobe adenocarcinoma. s/p right lower lobectomy 12/15/10  . ARTHRITIS, KNEE 03/24/2006  . CHF (congestive heart failure) ([place])   . CVA (cerebrovascular accident) ([place]) 6237    right embolic stroke, no residual deficits  . Depression 12/02/2010  . DIABETES MELLITUS, TYPE II 2000  . Diverticulitis   . GERD 12/04/2005  . Hemangioma of liver 12/02/2010  . HYPERLIPIDEMIA 12/04/2005  . HYPERTENSION 12/04/2005  . Meningioma ([place]) 07/21/2010   Review of Systems:  Refer to history of present illness and assessment and plans for pertinent review of systems, all others reviewed and negative  Assessment & Plan:   Type 2 Diabetes Mellitus  Hgb A1c 6.4 when checked three months ago. Blood glucose has remained right around 120 on blood pressure readings. Shes had no recorded or symptoms of hypoglycemia. Requesting a new meter, the one that they had is broken and the pharmacist suggested that she would need a new one. Patient has no sores or injury on her feet, has plans to follow up with podiatry.  - RTC for lab visit to follow up hgb A1c  - will ask  [name] to assist in ordering the new glucometer  - continue soliqua 30 units q day and jardiance 10 mg qd   Hypertension  Blood pressure has been well controlled on in office blood pressure checks. Blood pressure checked at home today is 149/76. Patient had vomiting which she thought may be related to lisinopril so she stopped taking this and vomiting improved.  - continue carvedilol 6.25 mg BID - d/c lisinopril, start losartan 25 mg daily  - RTC in one month for in person blood pressure check   Seizure disorder  Takes keppra daily without concern for side effects. Has not had any seizures since the time of meningioma dx.  - continue keppra 750 mg daily   CKD 3  Patient has had stable CKD 3 on labwork over the past two years. This is likely secondary to hypertension and diabetes. She has not developed symptoms of the CKD. She has not yet had initial nephrology evaluation.  - referral to nephrology - RTC for lab only to obtain BMP, phosphate, PTH, 1,25 Vitamin D   Chronic diastolic congestive heart failure Denies symptoms related to this including shortness of breath, chest pain, or edema.  - Continue to monitor   History of CVA  - continue asprin  - encouraged to resume rosuvastatin as discussed below   PAD  Denies symptoms related to this or new lower extremity ulcers. Continuing medication management.  - continue crestor, aspirin and cilostazol  Depression  Patient describes depression as well controlled, fluoxetine continues to help control her symptoms.  - continue Fluoxetine   Hyperlipidemia  Patient has been on rosuvastatin for a long while, recently developed vomiting and thought this may be related to the rosuvastatin or lisinopril so she stopped taking the medication. Vomiting has resolved, I feel it was more likely related to lisinopril and have asked her to try resuming the rosuvastatin.  - encouraged to resume rosuvastatin   Osteoarthritis  Currently has a  flair of right knee pain, has run out of tylenol which she uses as needed for flairs and is requesting a efill.  - refilled tylenol #3    See Encounters Tab for problem based charting.  Patient discussed with Dr. Rebeca Alert

## 2018-07-05 NOTE — Assessment & Plan Note (Signed)
Patient describes depression as well controlled, fluoxetine continues to help control her symptoms.  - continue Fluoxetine

## 2018-07-05 NOTE — Assessment & Plan Note (Signed)
Currently has a flair of right knee pain, has run out of tylenol which she uses as needed for flairs and is requesting a efill.  - refilled tylenol #3

## 2018-07-05 NOTE — Assessment & Plan Note (Signed)
>>  ASSESSMENT AND PLAN FOR CHRONIC DIASTOLIC CONGESTIVE HEART FAILURE (HCC) WRITTEN ON 07/05/2018  2:57 PM BY BLUM, NINA, MD  Denies symptoms related to this including shortness of breath, chest pain, or edema.  - Continue to monitor

## 2018-07-05 NOTE — Assessment & Plan Note (Signed)
Patient has had stable CKD 3 on labwork over the past two years. This is likely secondary to hypertension and diabetes. She has not developed symptoms of the CKD. She has not yet had initial nephrology evaluation.  - referral to nephrology - RTC for lab only to obtain BMP, phosphate, PTH, 1,25 Vitamin D

## 2018-07-05 NOTE — Assessment & Plan Note (Signed)
Denies symptoms related to this or new lower extremity ulcers. Continuing medication management.  - continue crestor, aspirin and cilostazol

## 2018-07-05 NOTE — Addendum Note (Signed)
Addended by: Truddie Crumble on: 07/05/2018 03:39 PM   Modules accepted: Orders

## 2018-07-05 NOTE — Assessment & Plan Note (Signed)
Denies symptoms related to this including shortness of breath, chest pain, or edema.  - Continue to monitor

## 2018-07-05 NOTE — Assessment & Plan Note (Signed)
Patient has been on rosuvastatin for a long while, recently developed vomiting and thought this may be related to the rosuvastatin or lisinopril so she stopped taking the medication. Vomiting has resolved, I feel it was more likely related to lisinopril and have asked her to try resuming the rosuvastatin.  - encouraged to resume rosuvastatin

## 2018-07-05 NOTE — Assessment & Plan Note (Signed)
Takes keppra daily without concern for side effects. Has not had any seizures since the time of meningioma dx.  - continue keppra 750 mg daily

## 2018-07-05 NOTE — Assessment & Plan Note (Signed)
Hgb A1c 6.4 when checked three months ago. Blood glucose has remained right around 120 on blood pressure readings. Shes had no recorded or symptoms of hypoglycemia. Requesting a new meter, the one that they had is broken and the pharmacist suggested that she would need a new one. Patient has no sores or injury on her feet, has plans to follow up with podiatry.  - RTC for lab visit to follow up hgb A1c  - will ask Butch Penny Plyler to assist in ordering the new glucometer  - continue soliqua 30 units q day and jardiance 10 mg qd

## 2018-07-06 ENCOUNTER — Other Ambulatory Visit: Payer: Self-pay | Admitting: Dietician

## 2018-07-06 DIAGNOSIS — IMO0002 Reserved for concepts with insufficient information to code with codable children: Secondary | ICD-10-CM

## 2018-07-06 DIAGNOSIS — E1140 Type 2 diabetes mellitus with diabetic neuropathy, unspecified: Secondary | ICD-10-CM

## 2018-07-06 NOTE — Telephone Encounter (Signed)
request for new meter and supplies. Left a message to be sure shorter pen needles are okay and if they want them sent to [facility] on [address]

## 2018-07-06 NOTE — Telephone Encounter (Signed)
-----   Message from [name], MD sent at 07/05/2018  2:15 PM EDT ----- Hello, this patient is requesting a new meter. Would you mind placing the order for whichever one would be best for her insurance and I will sign off? Thank you!

## 2018-07-07 MED ORDER — ACCU-CHEK GUIDE W/DEVICE KIT: 1.0000 | PACK | Freq: Three times a day (TID) | 0 refills | Status: DC

## 2018-07-07 MED ORDER — GLUCOSE BLOOD VI STRP: ORAL_STRIP | 3 refills | Status: DC

## 2018-07-07 MED ORDER — INSULIN PEN NEEDLE 32G X 4 MM MISC: 3 refills | Status: DC

## 2018-07-07 NOTE — Telephone Encounter (Signed)
Daughter confirmed need for new meter and pharmacy.

## 2018-07-21 ENCOUNTER — Other Ambulatory Visit: Payer: Medicare Other

## 2018-07-21 NOTE — Progress Notes (Signed)
Internal Medicine Clinic Attending  Case discussed with Dr. Blum at the time of the visit.  We reviewed the resident's history and exam and pertinent patient test results.  I agree with the assessment, diagnosis, and plan of care documented in the resident's note.  Alexander Raines, M.D., Ph.D.  

## 2018-07-29 ENCOUNTER — Other Ambulatory Visit: Payer: Self-pay

## 2018-07-29 NOTE — Patient Outreach (Signed)
[facility]) Care Management  07/29/2018  [name] 06/10/1933 [id number]   Medication Adherence call to Mrs. [name] [facility] Ident[employer] Verify spoke with patients daughter patient is past due on Rosuvastatin 20 mg she explain patient has been off the medication for two month because of side effects and doctors instructions daughter said patient will probability start on a new medication for cholesterol patient has already spoken with doctor. [name] is showing past due under [place].  [facility] Management Direct Dial [phone]  Fax [phone] [email or web address]

## 2018-08-01 ENCOUNTER — Other Ambulatory Visit: Payer: Self-pay | Admitting: Internal Medicine

## 2018-08-01 DIAGNOSIS — I10 Essential (primary) hypertension: Secondary | ICD-10-CM

## 2018-08-10 ENCOUNTER — Telehealth: Payer: Self-pay

## 2018-08-10 DIAGNOSIS — I5032 Chronic diastolic (congestive) heart failure: Secondary | ICD-10-CM

## 2018-08-10 DIAGNOSIS — IMO0002 Reserved for concepts with insufficient information to code with codable children: Secondary | ICD-10-CM

## 2018-08-10 DIAGNOSIS — E1140 Type 2 diabetes mellitus with diabetic neuropathy, unspecified: Secondary | ICD-10-CM

## 2018-08-10 NOTE — Telephone Encounter (Signed)
  [name] is a 85 y.o. female who was contacted on behalf of [facility] Task Force. Spoke with her daughter, [name]  Diabetes Assessment  DM meds and BS checks -  "What medications are you taking for diabetes? Bobbe Medico -  "How often do you check your blood sugars at home?" three times a day   - "What have your blood sugars been?" 117-175  High Blood Pressure Assessment  BP meds & BP checks -  "What medications are you taking for high blood pressure?" carvedilol and losartan - "How often do you check your blood pressure at home?" once a day - "What have your blood pressure readings been?" 132/72.   Coping with DM and BP - What else are you doing to help with your DM and BP - Diet? Not really on a diet.    Exercise? Walking up and down the stair.  Medication Access Issues  Medication Issues? -  "Are you getting your medicines refilled on time without skipping any doses?"               No - "Are you having any problems getting/taking your meds (cost, timing, transportation)?" No  - Do you need any meds refilled? No  Conclusion  Close the call  - "Any other questions or concerns?" Leg swelling started in February in both legs.

## 2018-08-11 ENCOUNTER — Encounter: Payer: Self-pay | Admitting: *Deleted

## 2018-08-11 MED ORDER — JARDIANCE 10 MG PO TABS: 10.0000 mg | ORAL_TABLET | Freq: Every day | ORAL | 12 refills | Status: DC

## 2018-09-14 ENCOUNTER — Other Ambulatory Visit: Payer: Self-pay

## 2018-09-14 NOTE — Patient Outreach (Signed)
[place]) Care Management  09/14/2018  [name] 11/04/1933 [id number]     Referral received from [facility] team for complex case management. Attempted outreach with [name]. Call received by her daughter, [name]. HIPAA identifiers verified. Per [name], [name] was not available to complete a risk assessment at the time of the call but is interested in [facility] services. She denies immediate care management needs. Agreeable to completing a telephonic assessment on next week. RNCM contact information provided. [name] was encouraged to call if her mother requires assistance prior to the scheduled outreach.    PLAN -Will follow-up next week.  [place] [phone]

## 2018-09-21 ENCOUNTER — Other Ambulatory Visit: Payer: Self-pay

## 2018-09-21 NOTE — Patient Outreach (Signed)
[name] [facility]) Care Management  09/21/2018  [name] 08-04-1933 [id number]   Outreach for completion of telephonic assessment. Call answered by Ms. Davisson's daughter, [name]. Per [name], [name] was out of the home with other family members. She anticipates [name] being available to complete the assessment on next week.   PLAN -Will follow-up on 09/28/18. [name] will call if assistance is needed prior to scheduled outreach.   [place] [phone]

## 2018-09-28 ENCOUNTER — Other Ambulatory Visit: Payer: Self-pay

## 2018-09-29 ENCOUNTER — Other Ambulatory Visit: Payer: Self-pay

## 2018-09-29 NOTE — Patient Outreach (Signed)
[place] [facility]) Care Management    [name] Jul 17, 1933 [id number]    Call placed to complete scheduled telephonic assessment. Spoke with [name] daughter, [name]. Per [name], [name] was away from the home at the time of the call. Assessment rescheduled for 09/29/18.  PLAN -Will follow up on 09/29/18.   [place] [phone]

## 2018-09-29 NOTE — Patient Outreach (Signed)
[facility]) Care Management  [place]  09/29/2018   JADIA CAPERS 05/17/1933 [id number]  Subjective:  Outreach for completion of telephonic assessment.  Objective:  Encounter Medications:  Outpatient Encounter Medications as of 09/29/2018  Medication Sig  . ACCU-CHEK FASTCLIX LANCETS MISC CHECK BLOOD SUGAR THREE TIMES A DAY. DIAG CODE E11.9. INSULIN DEPENDENT  . acetaminophen-codeine (TYLENOL #3) 300-30 MG tablet Take 1 tablet by mouth daily as needed for moderate pain or severe pain.  Marland Kitchen aspirin EC 81 MG tablet Take 81 mg by mouth daily.  . Blood Glucose Monitoring Suppl (ACCU-CHEK GUIDE) w/Device KIT 1 each by Does not apply route 3 (three) times daily.  . carvedilol (COREG) 6.25 MG tablet TAKE 1 TABLET (6.25 MG TOTAL) BY MOUTH 2 (TWO) TIMES DAILY WITH A MEAL.  . cilostazol (PLETAL) 100 MG tablet TAKE 1 TABLET BY MOUTH TWICE A DAY  . diclofenac sodium (VOLTAREN) 1 % GEL Apply 4 g topically 4 (four) times daily as needed (pain).  Marland Kitchen empagliflozin (JARDIANCE) 10 MG TABS tablet Take 10 mg by mouth daily.  Marland Kitchen FLUoxetine (PROZAC) 10 MG capsule TAKE 1 CAPSULE (10 MG TOTAL) BY MOUTH DAILY.  Marland Kitchen glucose blood (ACCU-CHEK GUIDE) test strip Check blood sugar 3 times per day  . Incontinence Supplies MISC For use to address bowel and bladder incontinence.  . Insulin Glargine-Lixisenatide (SOLIQUA) 100-33 UNT-MCG/ML SOPN Inject 30 Units into the skin daily.  . Insulin Pen Needle 32G X 4 MM MISC Use to administer soliqua one time a day  . levETIRAcetam (KEPPRA) 750 MG tablet TAKE 1 TABLET (750 MG TOTAL) BY MOUTH 2 (TWO) TIMES DAILY.  Marland Kitchen losartan (COZAAR) 25 MG tablet Take 1 tablet (25 mg total) by mouth daily.  Marland Kitchen omeprazole (PRILOSEC) 20 MG capsule Take 1 capsule (20 mg total) by mouth daily. For heartburn  . rosuvastatin (CRESTOR) 20 MG tablet Take 1 tablet (20 mg total) by mouth daily.  . [DISCONTINUED] carvedilol (COREG) 6.25 MG tablet TAKE 1 TABLET (6.25 MG TOTAL) BY  MOUTH 2 (TWO) TIMES DAILY WITH A MEAL.   No facility-administered encounter medications on file as of 09/29/2018.     Functional Status:  In your present state of health, do you have any difficulty performing the following activities: 09/29/2018 03/22/2018  Hearing? Y N  Vision? N Y  Difficulty concentrating or making decisions? [name]  Walking or climbing stairs? Y Y  Comment Climbing stairs. -  Dressing or bathing? Y Y  Doing errands, shopping? [name]  Preparing Food and eating ? Y -  Comment Preparing food -  Using the Toilet? N -  In the past six months, have you accidently leaked urine? N -  Do you have problems with loss of bowel control? N -  Managing your Medications? Y -  Managing your Finances? Y -  Housekeeping or managing your Housekeeping? Y -  Some recent data might be hidden    Fall/Depression Screening: Fall Risk  09/29/2018 03/22/2018 01/04/2018  Falls in the past year? 0 0 0  Number falls in past yr: - - -  Injury with Fall? - - -  Risk Factor Category  - - -  Risk for fall due to : Impaired balance/gait;Medication side effect Impaired balance/gait;Impaired mobility -  Risk for fall due to: Comment - - -  Follow up Falls prevention discussed Falls prevention discussed -   PHQ 2/9 Scores 09/29/2018 03/22/2018 01/04/2018 12/14/2017 11/23/2017 02/01/2017 08/18/2016  PHQ - 2 Score 0  '1 1 1 ' 0 0 0  PHQ- 9 Score - '3 2 3 ' 0 - -    Assessment:  Successful outreach with Ms. Berke and her daughter, [name]. HIPAA identifiers verified. [name] assisted with the telephonic assessment per Ms. Zaun's request. Ms. Baldonado denies concerns today. Per [name], she appears to be doing well.   Discussed medications and treatment recommendations. [name] reports that Ms. Quarry has the required medications and attempts to take them as prescribed. Seondra assist with medication preparation. Denies concerns regarding prescription costs. Reports that Ms. Bayard's blood pressure, weight and blood glucose  levels are not being monitored regularly. [name] is agreeable to monitoring her mother's BP and FBS daily. Will attempt to monitor her weight at least once a week when they receive the new scale.  Reviewed care management needs. Ms. Guardado lives in the home with [place]. Per [name], she requires assistance with ADLs and IADLs. She usually requires a cane or walker when ambulating. Denies recent falls. Reports that a personal care aide assists 7 days a week for approximately 3 hours a day. [name] reports preparing meals and providing transportation. States that other siblings and family members are available to assist if needed. Declines current need for meal delivery, transportation assistance or community resource referrals.  THN CM Care Plan Problem One     Most Recent Value  Care Plan Problem One  Risk for Complications r/t Chronic Illnesses  Role Documenting the Problem One  Care Management Coordinator  Care Plan for Problem One  Active  THN Long Term Goal   Over the next 90 days, patient will not experience chronic disease related complications.  THN Long Term Goal Start Date  09/29/18  Interventions for Problem One Long Term Goal  Reviewed medications, nutrition and home safety measures. Provided education regarding BP, FBS and weight parameters. Patient's daughter encouraged to monitor daily and record readings.  THN CM Short Term Goal #1   Over the next 30 days, patient will take all medications as prescribed.  THN CM Short Term Goal #1 Start Date  09/29/18  Interventions for Short Term Goal #1  Reviewed medications and indications for use with patient's daughter. Discussed importance of taking as prescribed and notifying MD if patient develops concerns side effects or appears unable to tolerate the prescribed regimen. [Daughter denies concerns regarding prescription cost.]  THN CM Short Term Goal #2   Over the next 30 days, patient will attend MD appointments as scheduled.  THN CM Short Term  Goal #2 Start Date  09/29/18  Interventions for Short Term Goal #2  Reviewed care team and pending appointments. Discussed options for transportation assistance if needed. [Daughter declines need for transportation assistance.]  THN CM Short Term Goal #3  Over the next 30 days, patient's daughter will assist with daily BP monitoring.  THN CM Short Term Goal #3 Start Date  09/29/18  Interventions for Short Tern Goal #3  Provided education regarding BP parameters and indications for notifying MD. Patient's daughter encouraged to monitor daily and maintain log to identify trends.   THN CM Short Term Goal #4  Over the next 30 days, patient will obtain scale. Patient's daughter and personal care aide will assist with monitoring and recording weights.  THN CM Short Term Goal #4 Start Date  09/29/18  Interventions for Short Term Goal #4  Patient with hx of CHF. Discussed weight parameters along with s/sx of fluid retention with daughter. Encouraged to weigh patient at least once a week if  unable to weigh daily. [Pending scale request with [employer].]  THN CM Short Term Goal #5   Over the next 30 days, daughter will assist monitor patient's FBS daily and record readings.  THN CM Short Term Goal #5 Start Date  09/29/18  Interventions for Short Term Goal #5  Reviewed most recent A1C. Provided education regarding FBS parameters. Discussed s/sx of hypoglycemia and hyperglycemia along with recommended interventions. Strongly encouraged to monitor daily and record readings to identify trends.  [A1C=6.4% as of 03/22/18.]    [facility] CM Care Plan Problem Two     Most Recent Value  Care Plan Problem Two  Risk for Falls  Role Documenting the Problem Two  Care Management Coordinator  Care Plan for Problem Two  Active  Interventions for Problem Two Long Term Goal   Provided education regarding home safety and fall prevention with patient's daughter.  THN Long Term Goal  Patient will not fall over the next 60 days.  THN Long Term  Goal Start Date  09/29/18  THN CM Short Term Goal #1   Over the next 30 days, patient will continue to use assistive device when ambulating inside and outside of  the home.  THN CM Short Term Goal #1 Start Date  09/29/18  Interventions for Short Term Goal #2   Provided education regarding fall prevention measures. Daughter reports that they recently relocated. Encouraged to ensure patient uses assistive device when ambulating.  THN CM Short Term Goal #2   Over the next 30 days, patient's daughter will ensure halllways and walking paths are clear prior to patient ambulating.  THN CM Short Term Goal #2 Start Date  09/29/18  Interventions for Short Term Goal #2  Reviewed home safety precautions. Discussed increased risk of  trips/falls if halls and pathways are not cleared.      PLAN -Will follow-up in two weeks.   [place] [phone]

## 2018-10-03 ENCOUNTER — Other Ambulatory Visit: Payer: Self-pay | Admitting: *Deleted

## 2018-10-04 MED ORDER — LOSARTAN POTASSIUM 25 MG PO TABS: 25.0000 mg | ORAL_TABLET | Freq: Every day | ORAL | 1 refills | Status: DC

## 2018-10-10 ENCOUNTER — Other Ambulatory Visit: Payer: Self-pay | Admitting: *Deleted

## 2018-10-10 DIAGNOSIS — I739 Peripheral vascular disease, unspecified: Secondary | ICD-10-CM

## 2018-10-10 MED ORDER — CILOSTAZOL 100 MG PO TABS: 100.0000 mg | ORAL_TABLET | Freq: Two times a day (BID) | ORAL | 1 refills | Status: DC

## 2018-10-13 ENCOUNTER — Other Ambulatory Visit: Payer: Self-pay

## 2018-10-13 NOTE — Patient Outreach (Signed)
[place] [facility]) Care Management  10/13/2018  KEONIA PASKO 1934/02/07 [id number]   Brief outreach with Ms. Gintz daughter, [name]. HIPAA identifiers verified. [name] reports Ms. Abid has been doing well over the last few weeks. No worsening or concerning symptoms. No decline in daily functions or activity tolerance. No falls. Denies urgent concerns or changes in care management needs.  She reports obtaining the requested refills. States Ms. Dimichele is taking her medications as prescribed.  [name] was unable to recall the recent BP, FBS readings or weight. Reports she will request assistance from Ms. Piacente's personal care aide. She is aware of worsening symptoms and indications for seeking medical attention.  PLAN -Will follow-up within 3 weeks to discuss BP, weight and FBS readings.   [place] Care Management [phone]

## 2018-11-04 ENCOUNTER — Other Ambulatory Visit: Payer: Self-pay

## 2018-11-04 NOTE — Patient Outreach (Signed)
[place] [facility]) Care Management  11/04/2018  [name] [birth year] [id number]    Follow-up outreach with regarding daily monitoring. Member's daughter [name] reports that the nursing assistant will be assisting with daily weights, blood pressure and blood glucose monitoring. Provided education regarding normal ranges and indications for notifying MD. [name] and nursing assistant are agreeable to monitoring and recording levels over the weekend. Will follow-up next week to discuss readings.  PLAN -Will follow-up next week.    [place] Care Management [phone]

## 2018-11-08 ENCOUNTER — Other Ambulatory Visit: Payer: Self-pay

## 2018-11-08 ENCOUNTER — Encounter: Payer: Self-pay | Admitting: Radiation Oncology

## 2018-11-08 ENCOUNTER — Ambulatory Visit (INDEPENDENT_AMBULATORY_CARE_PROVIDER_SITE_OTHER): Payer: Medicare Other | Admitting: Radiation Oncology

## 2018-11-08 VITALS — BP 134/69 | HR 84 | Temp 98.4°F | Wt 201.6 lb

## 2018-11-08 DIAGNOSIS — Z79899 Other long term (current) drug therapy: Secondary | ICD-10-CM

## 2018-11-08 DIAGNOSIS — N183 Chronic kidney disease, stage 3 (moderate): Secondary | ICD-10-CM | POA: Diagnosis not present

## 2018-11-08 DIAGNOSIS — E1122 Type 2 diabetes mellitus with diabetic chronic kidney disease: Secondary | ICD-10-CM

## 2018-11-08 DIAGNOSIS — E1165 Type 2 diabetes mellitus with hyperglycemia: Secondary | ICD-10-CM

## 2018-11-08 DIAGNOSIS — H9190 Unspecified hearing loss, unspecified ear: Secondary | ICD-10-CM

## 2018-11-08 DIAGNOSIS — E1140 Type 2 diabetes mellitus with diabetic neuropathy, unspecified: Secondary | ICD-10-CM

## 2018-11-08 DIAGNOSIS — G40909 Epilepsy, unspecified, not intractable, without status epilepticus: Secondary | ICD-10-CM

## 2018-11-08 DIAGNOSIS — Z0000 Encounter for general adult medical examination without abnormal findings: Secondary | ICD-10-CM

## 2018-11-08 DIAGNOSIS — E1169 Type 2 diabetes mellitus with other specified complication: Secondary | ICD-10-CM

## 2018-11-08 DIAGNOSIS — E1151 Type 2 diabetes mellitus with diabetic peripheral angiopathy without gangrene: Secondary | ICD-10-CM

## 2018-11-08 DIAGNOSIS — Z794 Long term (current) use of insulin: Secondary | ICD-10-CM

## 2018-11-08 DIAGNOSIS — Z8673 Personal history of transient ischemic attack (TIA), and cerebral infarction without residual deficits: Secondary | ICD-10-CM

## 2018-11-08 DIAGNOSIS — H903 Sensorineural hearing loss, bilateral: Secondary | ICD-10-CM

## 2018-11-08 DIAGNOSIS — F3342 Major depressive disorder, recurrent, in full remission: Secondary | ICD-10-CM

## 2018-11-08 DIAGNOSIS — I5032 Chronic diastolic (congestive) heart failure: Secondary | ICD-10-CM | POA: Diagnosis not present

## 2018-11-08 DIAGNOSIS — I130 Hypertensive heart and chronic kidney disease with heart failure and stage 1 through stage 4 chronic kidney disease, or unspecified chronic kidney disease: Secondary | ICD-10-CM

## 2018-11-08 DIAGNOSIS — I10 Essential (primary) hypertension: Secondary | ICD-10-CM

## 2018-11-08 DIAGNOSIS — I739 Peripheral vascular disease, unspecified: Secondary | ICD-10-CM

## 2018-11-08 DIAGNOSIS — E1159 Type 2 diabetes mellitus with other circulatory complications: Secondary | ICD-10-CM

## 2018-11-08 DIAGNOSIS — IMO0002 Reserved for concepts with insufficient information to code with codable children: Secondary | ICD-10-CM

## 2018-11-08 DIAGNOSIS — Z23 Encounter for immunization: Secondary | ICD-10-CM

## 2018-11-08 DIAGNOSIS — E785 Hyperlipidemia, unspecified: Secondary | ICD-10-CM

## 2018-11-08 LAB — BASIC METABOLIC PANEL
Anion gap: 8 (ref 5–15)
BUN: 14 mg/dL (ref 8–23)
CO2: 26 mmol/L (ref 22–32)
Calcium: 8.6 mg/dL — ABNORMAL LOW (ref 8.9–10.3)
Chloride: 105 mmol/L (ref 98–111)
Creatinine, Ser: 1.07 mg/dL — ABNORMAL HIGH (ref 0.44–1.00)
GFR calc Af Amer: 56 mL/min — ABNORMAL LOW (ref >60)
GFR calc non Af Amer: 48 mL/min — ABNORMAL LOW (ref >60)
Glucose, Bld: 356 mg/dL — ABNORMAL HIGH (ref 70–99)
Potassium: 4.1 mmol/L (ref 3.5–5.1)
Sodium: 139 mmol/L (ref 135–145)

## 2018-11-08 LAB — POCT GLYCOSYLATED HEMOGLOBIN (HGB A1C): Hemoglobin A1C: 8.8 % — AB (ref 4.0–5.6)

## 2018-11-08 LAB — GLUCOSE, CAPILLARY: Glucose-Capillary: 294 mg/dL — ABNORMAL HIGH (ref 70–99)

## 2018-11-08 MED ORDER — CARVEDILOL 6.25 MG PO TABS: 6.2500 mg | ORAL_TABLET | Freq: Two times a day (BID) | ORAL | 3 refills | Status: DC

## 2018-11-08 MED ORDER — ACETAMINOPHEN-CODEINE #3 300-30 MG PO TABS: 1.0000 | ORAL_TABLET | Freq: Every day | ORAL | 3 refills | Status: DC | PRN

## 2018-11-08 MED ORDER — JARDIANCE 10 MG PO TABS: 10.0000 mg | ORAL_TABLET | Freq: Every day | ORAL | 12 refills | Status: DC

## 2018-11-08 MED ORDER — ROSUVASTATIN CALCIUM 20 MG PO TABS: 20.0000 mg | ORAL_TABLET | Freq: Every day | ORAL | 3 refills | Status: DC

## 2018-11-08 NOTE — Assessment & Plan Note (Signed)
PAD (peripheral artery disease) (HCC)  -     Pt reports continued lower extremity cramping and pain associated w/ known PAD which is somewhat controlled w/ tylenol 3 which we reordered today; pt's daughter reports surgeon said they could intervene but given pt essentially wheelchair bound it would be of little assistance and patient declined  -     Continue medical management with tylenol 3, cilostazol, crestor (reordered) and aspirin

## 2018-11-08 NOTE — Assessment & Plan Note (Signed)
Chronic Kidney Disease  - pt reported taking aleve occasionally and was counseled to avoid NSAIDs including advil, aleve, goody's powder  - BMP today

## 2018-11-08 NOTE — Assessment & Plan Note (Signed)
Recurrent major depressive disorder, in full remission ([place])  -     Well controlled, patient without anxiety or depressive sx  -     Continue fluoxetine

## 2018-11-08 NOTE — Assessment & Plan Note (Signed)
Healthcare maintenance  -     Patient noted to be overdue for ophthalmology, A1c and flu shot; confirmed patient saw opthalmology this past January, A1c obtained today and flu shot given today

## 2018-11-08 NOTE — Assessment & Plan Note (Signed)
>>  ASSESSMENT AND PLAN FOR CHRONIC DIASTOLIC CONGESTIVE HEART FAILURE (HCC) WRITTEN ON 11/08/2018  3:55 PM BY MARITZA STAGGER, MD  Chronic diastolic congestive heart failure (HCC)  -     Stable, no chest pain, SOB or persistent lower extremity edema; euvolemic on exam; patient reports her weight fluctuates between 200-210 and is 201.5 today  -     Continue Coreg  (reordered) and Losartan   -     Continue to monitor weight and if weight begins to increase with increased swelling and any trouble breathing, patient counseled to reach out to us  or her cardiologist or call 911 if shortness of breath is severe

## 2018-11-08 NOTE — Assessment & Plan Note (Signed)
Seizure disorder ([place])  -     Well controlled on current dose of Keppra, continue as before

## 2018-11-08 NOTE — Assessment & Plan Note (Signed)
Hearing Loss  -     Pt reported visiting with an audiologist w/ reported plan for hearing aids but never received a call back; patient counseled to follow up with [facility] at [phone] regarding hearing aids.

## 2018-11-08 NOTE — Assessment & Plan Note (Signed)
Uncontrolled type 2 diabetes mellitus with diabetic neuropathy, with long-term current use of insulin ([place]) -     Patient had well controlled DM2 at last visit 6 mo prior w/ A1c of 6.4 but due to patient's inability to obtain her Jardiance prescription, patient's diabetes has been uncontrolled and A1c is 8.8 today; patient reports well controlled blood sugars at home but POC glucose in office is 294 -     Restart Jardiance (reordered) and call if you have any issues with any of your prescriptions -     Continue soliqua 30 units q day  -     Patient advised to bring her meter to her next appointment

## 2018-11-08 NOTE — Patient Instructions (Addendum)
Thank you for coming to your appointment. It was so nice to meet you.   [name] was seen today for medical management of chronic issues.  Diagnoses and all orders for this visit:  Chronic diastolic congestive heart failure ([place])  -     Continue Coreg (reordered) and Losartan  -     Continue to monitor weight and if weight begins to increase with increased swelling and any trouble breathing, please reach out to [place] or your cardiologist or call 911 with shortness of breath is severe  Chronic Kidney Disease  - BMP today   - avoid NSAIDs including advil, aleve, goody's powder  Healthcare maintenance  -     Flu shot today  Hearing Loss  -     Please follow up with the Hearing Doctor at [phone] regarding hearing aids.  Hyperlipidemia  -     Continue rosuvastatin (reordered)  Hypertension associated with diabetes ([place])  -     Well-controlled. Well done!  PAD (peripheral artery disease) ([place])  -     Reordered tylenol 3  Recurrent major depressive disorder, in full remission ([place])  -     Continue fluoxetine, I'm so pleased you feel well!  Seizure disorder ([place])  -     Well controlled on current dose of Keppra, continue as before  Uncontrolled type 2 diabetes mellitus with diabetic neuropathy, with long-term current use of insulin (HCC) -     POC Hbg A1C of 8.8, increased from baseline -     Restart Jardiance (reordered) and call if you have any issues with any of your prescriptions   Please keep up the good work. I look forward to seeing you again soon at your follow up in 3 months.  Sincerely,  Al Decant, MD

## 2018-11-08 NOTE — Assessment & Plan Note (Signed)
Hyperlipidemia  -     Pt without complaints of N/V since restarting rosuvastatin   -     Continue rosuvastatin (reordered)  -     Recheck lipids at next visit w/ goal below 100 given hx CVA

## 2018-11-08 NOTE — Assessment & Plan Note (Signed)
Chronic diastolic congestive heart failure (HCC)  -     Stable, no chest pain, SOB or persistent lower extremity edema; euvolemic on exam; patient reports her weight fluctuates between 200-210 and is 201.5 today  -     Continue Coreg (reordered) and Losartan  -     Continue to monitor weight and if weight begins to increase with increased swelling and any trouble breathing, patient counseled to reach out to [place] or her cardiologist or call 911 if shortness of breath is severe

## 2018-11-08 NOTE — Progress Notes (Signed)
   CC: f/u of chronic medical conditions  HPI:  [name] is a 85 y.o. here for follow up of her chronic medical conditions.  Past Medical History:  Diagnosis Date  . Adenocarcinoma of lung ([place])     Right upper lobe adenocarcinoma. s/p right lower lobectomy 12/15/10  . ARTHRITIS, KNEE 03/24/2006  . CHF (congestive heart failure) ([place])   . CVA (cerebrovascular accident) ([place]) 1610    right embolic stroke, no residual deficits  . Depression 12/02/2010  . DIABETES MELLITUS, TYPE II 2000  . Diverticulitis   . GERD 12/04/2005  . Hemangioma of liver 12/02/2010  . HYPERLIPIDEMIA 12/04/2005  . HYPERTENSION 12/04/2005  . Meningioma ([place]) 07/21/2010   Review of Systems:    Review of Systems  Constitutional: Negative for chills, fever, malaise/fatigue and weight loss (pt's weight fluctuates between 200 and 210).  HENT: Negative for sore throat.   Eyes: Negative for blurred vision.  Respiratory: Negative for cough and shortness of breath.   Cardiovascular: Negative for chest pain.  Gastrointestinal: Negative for nausea and vomiting.  Genitourinary: Negative for frequency.  Musculoskeletal:       Leg pain  Skin: Negative for rash.  Neurological: Negative for seizures.  Endo/Heme/Allergies: Negative for polydipsia.  Psychiatric/Behavioral: Negative for depression. The patient is not nervous/anxious.   All other systems reviewed and are negative.   Physical Exam:  Vitals:   11/08/18 1435  BP: 134/69  Pulse: 84  Temp: 98.4 F (36.9 C)  TempSrc: Oral  SpO2: 98%  Weight: 201 lb 9.6 oz (91.4 kg)     Physical Exam  Constitutional: She is oriented to person, place, and time and well-developed, well-nourished, and in no distress. No distress.  HENT:  Head: Normocephalic and atraumatic.  Eyes: Right eye exhibits no discharge. Left eye exhibits no discharge.  Neck: Normal range of motion.  Cardiovascular: Normal rate, regular rhythm and normal heart sounds.  No murmur  heard. Pulmonary/Chest: Effort normal and breath sounds normal. No respiratory distress. She exhibits no tenderness.  Abdominal: Soft. Bowel sounds are normal. She exhibits no distension.  Neurological: She is alert and oriented to person, place, and time.  Skin: Skin is warm and dry. She is not diaphoretic. No erythema.  Nursing note and vitals reviewed.  Assessment & Plan:   See Encounters Tab for problem based charting.  Patient seen with Dr. Philipp Ovens

## 2018-11-08 NOTE — Assessment & Plan Note (Signed)
Hypertension associated with diabetes ([place])  -     Well-controlled in clinic today, 134/69  -     Continue carvedilol 6.25 BID, losartan 25 mg daily  -     BMP today

## 2018-11-09 NOTE — Progress Notes (Signed)
Internal Medicine Clinic Attending  I saw and evaluated the patient.  I personally confirmed the key portions of the history and exam documented by Dr. Madilyn Fireman and I reviewed pertinent patient test results.  The assessment, diagnosis, and plan were formulated together and I agree with the documentation in the resident's note.

## 2018-11-11 ENCOUNTER — Other Ambulatory Visit: Payer: Self-pay

## 2018-11-11 NOTE — Patient Outreach (Signed)
[place] [facility]) Care Management  11/11/2018  [name] 07/26/1933 [id number]    Attempted follow-up with [name]. Outreach unsuccessful. Will follow-up within 3-4 business days.   [place] [facility] Care Management [phone]

## 2018-11-16 ENCOUNTER — Other Ambulatory Visit: Payer: Self-pay

## 2018-11-16 NOTE — Patient Outreach (Signed)
[name]) Care Management  11/16/2018  [name] 07-26-1933 [id number]    2nd unsuccessful outreach attempt. Will mail unsuccessful outreach letter and follow-up within 3-4 business days.    [place] Care Management [phone]

## 2018-11-21 ENCOUNTER — Other Ambulatory Visit: Payer: Self-pay

## 2018-11-21 NOTE — Patient Outreach (Signed)
[place] [facility]) Care Management  11/21/2018  MIKEL PYON 21-Feb-1933 [id number]    3rd unsuccessful outreach attempt. Phone rang multiple times without option to leave a voice message. Case closure pending.    [place] Care Management [phone]

## 2019-01-11 ENCOUNTER — Other Ambulatory Visit: Payer: Self-pay | Admitting: *Deleted

## 2019-01-11 DIAGNOSIS — E1140 Type 2 diabetes mellitus with diabetic neuropathy, unspecified: Secondary | ICD-10-CM

## 2019-01-11 DIAGNOSIS — IMO0002 Reserved for concepts with insufficient information to code with codable children: Secondary | ICD-10-CM

## 2019-01-11 MED ORDER — SOLIQUA 100-33 UNT-MCG/ML ~~LOC~~ SOPN: 30.0000 [IU] | PEN_INJECTOR | Freq: Every day | SUBCUTANEOUS | 6 refills | Status: DC

## 2019-01-11 MED ORDER — ACCU-CHEK GUIDE VI STRP: ORAL_STRIP | 3 refills | Status: DC

## 2019-01-11 NOTE — Telephone Encounter (Signed)
Next appt scheduled  02/21/19 with PCP.

## 2019-01-19 ENCOUNTER — Other Ambulatory Visit: Payer: Self-pay | Admitting: Student in an Organized Health Care Education/Training Program

## 2019-02-05 ENCOUNTER — Other Ambulatory Visit: Payer: Self-pay | Admitting: Internal Medicine

## 2019-02-05 DIAGNOSIS — I739 Peripheral vascular disease, unspecified: Secondary | ICD-10-CM

## 2019-02-21 ENCOUNTER — Encounter: Payer: Medicare Other | Admitting: Radiation Oncology

## 2019-02-21 NOTE — Assessment & Plan Note (Deleted)
There were no vitals filed for this visit. There is no height or weight on file to calculate BMI.

## 2019-02-21 NOTE — Progress Notes (Deleted)
   CC: diabetes follow up  HPI:  [name] is a 86 y.o. F here for follow up of her chronic medical conditions. Please see A&P for full HPI.  Past Medical History:  Diagnosis Date  . Adenocarcinoma of lung ([place])     Right upper lobe adenocarcinoma. s/p right lower lobectomy 12/15/10  . ARTHRITIS, KNEE 03/24/2006  . CHF (congestive heart failure) ([place])   . CVA (cerebrovascular accident) ([place]) 9629    right embolic stroke, no residual deficits  . Depression 12/02/2010  . DIABETES MELLITUS, TYPE II 2000  . Diverticulitis   . GERD 12/04/2005  . Hemangioma of liver 12/02/2010  . HYPERLIPIDEMIA 12/04/2005  . HYPERTENSION 12/04/2005  . Meningioma ([place]) 07/21/2010   Review of Systems:  Please see A&P for full ROS.  Physical Exam:  There were no vitals filed for this visit. ***  Assessment & Plan:   See Encounters Tab for problem based charting.  Patient {GC/GE:[id number]::"discussed with","seen with"} Dr. {NAMES:[id number]::"Butcher","Guilloud","Hoffman","Mullen","Narendra","Raines","Vincent"}

## 2019-03-17 ENCOUNTER — Other Ambulatory Visit: Payer: Self-pay | Admitting: *Deleted

## 2019-03-17 DIAGNOSIS — I739 Peripheral vascular disease, unspecified: Secondary | ICD-10-CM

## 2019-03-17 NOTE — Telephone Encounter (Signed)
Pt's daughter stated she called last Friday for Tylenol#3 refill and it was denied. I do not see a refill request. I will place a refill request. And she will call back on Monday to schedule an appt w/pt's PCP. Stated pt missed last appt b/c the daughter was sick. Thanks

## 2019-03-20 MED ORDER — ACETAMINOPHEN-CODEINE #3 300-30 MG PO TABS: 1.0000 | ORAL_TABLET | Freq: Every day | ORAL | 3 refills | Status: DC | PRN

## 2019-03-20 NOTE — Telephone Encounter (Signed)
Please schedule pt an appt w/PCP. Thanks

## 2019-04-11 ENCOUNTER — Encounter: Payer: Self-pay | Admitting: Radiation Oncology

## 2019-04-11 ENCOUNTER — Other Ambulatory Visit: Payer: Self-pay

## 2019-04-11 ENCOUNTER — Ambulatory Visit (INDEPENDENT_AMBULATORY_CARE_PROVIDER_SITE_OTHER): Payer: Medicare Other | Admitting: Radiation Oncology

## 2019-04-11 VITALS — BP 134/66 | HR 79 | Temp 98.1°F | Ht 64.0 in | Wt 200.0 lb

## 2019-04-11 DIAGNOSIS — E1165 Type 2 diabetes mellitus with hyperglycemia: Secondary | ICD-10-CM

## 2019-04-11 DIAGNOSIS — Z23 Encounter for immunization: Secondary | ICD-10-CM

## 2019-04-11 DIAGNOSIS — Z79899 Other long term (current) drug therapy: Secondary | ICD-10-CM

## 2019-04-11 DIAGNOSIS — H903 Sensorineural hearing loss, bilateral: Secondary | ICD-10-CM

## 2019-04-11 DIAGNOSIS — E1140 Type 2 diabetes mellitus with diabetic neuropathy, unspecified: Secondary | ICD-10-CM | POA: Diagnosis not present

## 2019-04-11 DIAGNOSIS — Z794 Long term (current) use of insulin: Secondary | ICD-10-CM

## 2019-04-11 DIAGNOSIS — E1159 Type 2 diabetes mellitus with other circulatory complications: Secondary | ICD-10-CM

## 2019-04-11 DIAGNOSIS — Z0000 Encounter for general adult medical examination without abnormal findings: Secondary | ICD-10-CM

## 2019-04-11 DIAGNOSIS — I10 Essential (primary) hypertension: Secondary | ICD-10-CM

## 2019-04-11 DIAGNOSIS — IMO0002 Reserved for concepts with insufficient information to code with codable children: Secondary | ICD-10-CM

## 2019-04-11 DIAGNOSIS — H9190 Unspecified hearing loss, unspecified ear: Secondary | ICD-10-CM

## 2019-04-11 LAB — POCT GLYCOSYLATED HEMOGLOBIN (HGB A1C): Hemoglobin A1C: 7.8 % — AB (ref 4.0–5.6)

## 2019-04-11 LAB — GLUCOSE, CAPILLARY: Glucose-Capillary: 163 mg/dL — ABNORMAL HIGH (ref 70–99)

## 2019-04-11 NOTE — Patient Instructions (Addendum)
Thank you for coming to your appointment. It was so nice to see you. Today we discussed  [name] was seen today for follow-up.  Diagnoses and all orders for this visit:  Uncontrolled type 2 diabetes mellitus with diabetic neuropathy, with long-term current use of insulin (HCC) -     POC Hbg A1C - 7.8. Perfect! Continue current medicines -     Ambulatory referral to Ophthalmology  Hypertension associated with diabetes ([place]) -     Blood pressure is perfect! Continue current medicines  Healthcare maintenance -     Tetanus shot today  Hearing loss       -     follow up with [facility] at [phone] regarding hearing aids  Sincerely,  Al Decant, MD

## 2019-04-11 NOTE — Addendum Note (Signed)
Addended by: Maxie Barb on: 04/11/2019 04:46 PM   Modules accepted: Orders

## 2019-04-11 NOTE — Assessment & Plan Note (Signed)
Tdap given.

## 2019-04-11 NOTE — Assessment & Plan Note (Signed)
Daughter reports patient still has not received hearing aids. She said she was unaware she needed to call [facility]. Provided patient with [facility] telephone number to fu regarding hearing aids.  Plan: -pt to fu with [facility]

## 2019-04-11 NOTE — Progress Notes (Signed)
   CC: diabetes  HPI:  [name] is a 86 y.o. female who presents to the Internal Medicine Clinic for diabetes and follow up of her chronic medical problems. Please see Assessment and Plan for full HPI.  Past Medical History:  Diagnosis Date  . Adenocarcinoma of lung ([place])     Right upper lobe adenocarcinoma. s/p right lower lobectomy 12/15/10  . ARTHRITIS, KNEE 03/24/2006  . CHF (congestive heart failure) ([place])   . CVA (cerebrovascular accident) ([place]) 9563    right embolic stroke, no residual deficits  . Depression 12/02/2010  . DIABETES MELLITUS, TYPE II 2000  . Diverticulitis   . GERD 12/04/2005  . Hemangioma of liver 12/02/2010  . HYPERLIPIDEMIA 12/04/2005  . HYPERTENSION 12/04/2005  . Meningioma ([place]) 07/21/2010   Review of Systems:  Please see Assessment and Plan for full ROS.  Physical Exam:  Vitals:   04/11/19 1520  BP: 134/66  Pulse: 79  Temp: 98.1 F (36.7 C)  TempSrc: Oral  SpO2: 98%  Weight: 200 lb (90.7 kg)  Height: 5\' 4"  (1.626 m)   Physical Exam  Constitutional: She is well-developed, well-nourished, and in no distress. No distress.  Cardiovascular: Normal rate, regular rhythm, normal heart sounds and intact distal pulses.  No murmur heard. Pulmonary/Chest: Effort normal and breath sounds normal. No respiratory distress.  Abdominal: Soft. Bowel sounds are normal. She exhibits no distension.  Musculoskeletal:        General: Normal range of motion.  Neurological: She is alert.  Hard of hearing  Skin: Skin is warm and dry. She is not diaphoretic.  Nursing note and vitals reviewed.  Assessment & Plan:   See Encounters Tab for problem based charting.  Patient discussed with Dr. Philipp Ovens

## 2019-04-11 NOTE — Assessment & Plan Note (Signed)
This is chronic and well controlled.  Today's Vitals   04/11/19 1520  BP: 134/66  Pulse: 79  Temp: 98.1 F (36.7 C)  TempSrc: Oral  SpO2: 98%  Weight: 200 lb (90.7 kg)  Height: 5\' 4"  (1.626 m)   Body mass index is 34.33 kg/m.  Plan: -continue losartan and carvedilol  -fu in 3 mo

## 2019-04-11 NOTE — Assessment & Plan Note (Signed)
This is chronic and well controlled. A1c down from 8.8 to 7.8 since restarting Jardiance 10 mg daily. Foot exam performed today. Good pulses. Ophthalmology referral placed.  Plan: -fu in 3 mo for a1c recheck -continue current meds -fu with ophtho

## 2019-04-13 ENCOUNTER — Other Ambulatory Visit: Payer: Self-pay

## 2019-04-13 NOTE — Patient Outreach (Signed)
[facility]) Care Management  04/13/2019  [name] November 18, 1933 [id number]   Medication Adherence call to Mrs. [name] [facility] Identifiers Verify spoke with patient daughter,patient is showing past due on Jardiance 10 mg ,daughter explain patient is taking 1 tablet daily and picks up medication every month at the beginning of the month,daughte explain patient has medication at this time. [name] is shoeing past due under [place].   [place] Management Direct Dial [phone]  Fax [phone] [email or web address]

## 2019-04-18 NOTE — Progress Notes (Signed)
Internal Medicine Clinic Attending  Case discussed with Dr. Lanierat the time of the visit.  We reviewed the resident's history and exam and pertinent patient test results.  I agree with the assessment, diagnosis, and plan of care documented in the resident's note.   

## 2019-05-09 ENCOUNTER — Other Ambulatory Visit: Payer: Self-pay | Admitting: Internal Medicine

## 2019-05-09 DIAGNOSIS — I739 Peripheral vascular disease, unspecified: Secondary | ICD-10-CM

## 2019-05-24 ENCOUNTER — Other Ambulatory Visit: Payer: Self-pay | Admitting: *Deleted

## 2019-05-24 DIAGNOSIS — G40909 Epilepsy, unspecified, not intractable, without status epilepticus: Secondary | ICD-10-CM

## 2019-05-24 MED ORDER — FLUOXETINE HCL 10 MG PO CAPS: 10.0000 mg | ORAL_CAPSULE | Freq: Every day | ORAL | 1 refills | Status: DC

## 2019-05-24 MED ORDER — LEVETIRACETAM 750 MG PO TABS: 750.0000 mg | ORAL_TABLET | Freq: Two times a day (BID) | ORAL | 1 refills | Status: DC

## 2019-06-20 DIAGNOSIS — Z794 Long term (current) use of insulin: Secondary | ICD-10-CM | POA: Diagnosis not present

## 2019-06-20 DIAGNOSIS — H524 Presbyopia: Secondary | ICD-10-CM | POA: Diagnosis not present

## 2019-06-20 DIAGNOSIS — H43823 Vitreomacular adhesion, bilateral: Secondary | ICD-10-CM | POA: Diagnosis not present

## 2019-06-20 DIAGNOSIS — E119 Type 2 diabetes mellitus without complications: Secondary | ICD-10-CM | POA: Diagnosis not present

## 2019-06-20 LAB — HM DIABETES EYE EXAM

## 2019-07-25 ENCOUNTER — Telehealth: Payer: Self-pay | Admitting: *Deleted

## 2019-07-25 NOTE — Telephone Encounter (Signed)
Received faxed request for order, CMN, and OV notes addressing need for urinary incontinence supplies from Amy H at [facility]. This has not been addressed since May 2019. Will route to [employer] for assistance scheduling patient to discuss need. This may be done by telehealth. Hubbard Hartshorn, BSN, RN-BC

## 2019-07-26 NOTE — Telephone Encounter (Signed)
This patient is sch for 07/27/2019 to f/u.

## 2019-07-27 ENCOUNTER — Other Ambulatory Visit: Payer: Self-pay | Admitting: *Deleted

## 2019-07-27 ENCOUNTER — Ambulatory Visit (INDEPENDENT_AMBULATORY_CARE_PROVIDER_SITE_OTHER): Payer: Medicare Other | Admitting: Internal Medicine

## 2019-07-27 ENCOUNTER — Other Ambulatory Visit: Payer: Self-pay

## 2019-07-27 DIAGNOSIS — I739 Peripheral vascular disease, unspecified: Secondary | ICD-10-CM

## 2019-07-27 DIAGNOSIS — I10 Essential (primary) hypertension: Secondary | ICD-10-CM

## 2019-07-27 DIAGNOSIS — R32 Unspecified urinary incontinence: Secondary | ICD-10-CM | POA: Diagnosis not present

## 2019-07-27 DIAGNOSIS — I5032 Chronic diastolic (congestive) heart failure: Secondary | ICD-10-CM

## 2019-07-27 DIAGNOSIS — IMO0002 Reserved for concepts with insufficient information to code with codable children: Secondary | ICD-10-CM

## 2019-07-27 DIAGNOSIS — G40909 Epilepsy, unspecified, not intractable, without status epilepticus: Secondary | ICD-10-CM

## 2019-07-27 NOTE — Progress Notes (Signed)
  [facility] Internal Medicine Residency Telephone Encounter Continuity Care Appointment  HPI:   This telephone encounter was created for [name] on 07/27/2019 for the following purpose/cc DME supply for urinary incontinency and medications mail order. I talked to patient's daughter. She endorses that her mother has chronic urinary incontinancy and uses diaper. She also says that she was told that she should talk to [place] to have some of her mother's medicine mailed to their houseand were supposed to get a fax about one of the medications (not sure which one) . [name] is doing well and no new concern or chief complaint per dagther.      Past Medical History:  Past Medical History:  Diagnosis Date  . Adenocarcinoma of lung ([place])     Right upper lobe adenocarcinoma. s/p right lower lobectomy 12/15/10  . ARTHRITIS, KNEE 03/24/2006  . CHF (congestive heart failure) ([place])   . CVA (cerebrovascular accident) ([place]) 2683    right embolic stroke, no residual deficits  . Depression 12/02/2010  . DIABETES MELLITUS, TYPE II 2000  . Diverticulitis   . GERD 12/04/2005  . Hemangioma of liver 12/02/2010  . HYPERLIPIDEMIA 12/04/2005  . HYPERTENSION 12/04/2005  . Meningioma ([place]) 07/21/2010      ROS:      Assessment / Plan / Recommendations:   Please see A&P under problem oriented charting for assessment of the patient's acute and chronic medical conditions.   As always, pt is advised that if symptoms worsen or new symptoms arise, they should go to an urgent care facility or to to ER for further evaluation.   Consent and Medical Decision Making:   Patient discussed with Dr. Evette Doffing  This is a telephone encounter between [name] and [place] on 07/27/2019 for DME supply and mail order for medictaions. The visit was conducted with the patient located at home and [facility] at [facility]. The patient's identity was confirmed using their DOB and current address. The  his/her legal guardian has consented to being evaluated through a telephone encounter and understands the associated risks (an examination cannot be done and the patient may need to come in for an appointment) / benefits (allows the patient to remain at home, decreasing exposure to coronavirus). I personally spent 6 minutes on medical discussion.

## 2019-07-27 NOTE — Assessment & Plan Note (Signed)
(  Tele visit) I talked to patient's daughter. Ms. Lyday has chronic Hx of urinary incontinency. Will do paper work to send DME (diaper supply)

## 2019-07-28 MED ORDER — ROSUVASTATIN CALCIUM 20 MG PO TABS: 20.0000 mg | ORAL_TABLET | Freq: Every day | ORAL | 1 refills | Status: DC

## 2019-07-28 MED ORDER — LOSARTAN POTASSIUM 25 MG PO TABS: 25.0000 mg | ORAL_TABLET | Freq: Every day | ORAL | 1 refills | Status: DC

## 2019-07-28 MED ORDER — SOLIQUA 100-33 UNT-MCG/ML ~~LOC~~ SOPN: 30.0000 [IU] | PEN_INJECTOR | Freq: Every day | SUBCUTANEOUS | 1 refills | Status: DC

## 2019-07-28 MED ORDER — CARVEDILOL 6.25 MG PO TABS: 6.2500 mg | ORAL_TABLET | Freq: Two times a day (BID) | ORAL | 3 refills | Status: DC

## 2019-07-28 MED ORDER — CILOSTAZOL 100 MG PO TABS: 100.0000 mg | ORAL_TABLET | Freq: Two times a day (BID) | ORAL | 1 refills | Status: DC

## 2019-07-28 MED ORDER — EMPAGLIFLOZIN 10 MG PO TABS: 10.0000 mg | ORAL_TABLET | Freq: Every day | ORAL | 1 refills | Status: DC

## 2019-07-28 MED ORDER — LEVETIRACETAM 750 MG PO TABS: 750.0000 mg | ORAL_TABLET | Freq: Two times a day (BID) | ORAL | 1 refills | Status: DC

## 2019-07-28 MED ORDER — FLUOXETINE HCL 10 MG PO CAPS: 10.0000 mg | ORAL_CAPSULE | Freq: Every day | ORAL | 1 refills | Status: DC

## 2019-07-31 NOTE — Telephone Encounter (Signed)
Signed order for urinary incontinence supplies, CMN, and OV notes from 07/27/2019 faxed to [place] at [facility] [phone]. Fax confirmation receipt received. Hubbard Hartshorn, BSN, RN-BC

## 2019-07-31 NOTE — Progress Notes (Signed)
Internal Medicine Clinic Attending  Case discussed with Dr. Myrtie Hawk  at the time of the visit.  We reviewed the resident's history and pertinent patient test results.  I agree with the assessment, diagnosis, and plan of care documented in the resident's note.

## 2019-08-14 ENCOUNTER — Encounter: Payer: Self-pay | Admitting: *Deleted

## 2019-08-23 ENCOUNTER — Other Ambulatory Visit: Payer: Self-pay | Admitting: *Deleted

## 2019-08-23 DIAGNOSIS — I739 Peripheral vascular disease, unspecified: Secondary | ICD-10-CM

## 2019-08-23 MED ORDER — ACETAMINOPHEN-CODEINE #3 300-30 MG PO TABS: 1.0000 | ORAL_TABLET | Freq: Every day | ORAL | 3 refills | Status: DC | PRN

## 2019-11-06 DIAGNOSIS — E119 Type 2 diabetes mellitus without complications: Secondary | ICD-10-CM | POA: Diagnosis not present

## 2019-11-27 DIAGNOSIS — E119 Type 2 diabetes mellitus without complications: Secondary | ICD-10-CM | POA: Diagnosis not present

## 2019-12-21 ENCOUNTER — Other Ambulatory Visit: Payer: Self-pay

## 2019-12-21 DIAGNOSIS — I739 Peripheral vascular disease, unspecified: Secondary | ICD-10-CM

## 2019-12-21 MED ORDER — ROSUVASTATIN CALCIUM 20 MG PO TABS: 20.0000 mg | ORAL_TABLET | Freq: Every day | ORAL | 1 refills | Status: DC

## 2019-12-22 DIAGNOSIS — E119 Type 2 diabetes mellitus without complications: Secondary | ICD-10-CM | POA: Diagnosis not present

## 2020-01-17 DIAGNOSIS — E119 Type 2 diabetes mellitus without complications: Secondary | ICD-10-CM | POA: Diagnosis not present

## 2020-02-06 ENCOUNTER — Other Ambulatory Visit: Payer: Self-pay | Admitting: *Deleted

## 2020-02-06 DIAGNOSIS — G40909 Epilepsy, unspecified, not intractable, without status epilepticus: Secondary | ICD-10-CM

## 2020-02-06 MED ORDER — LEVETIRACETAM 750 MG PO TABS: 750.0000 mg | ORAL_TABLET | Freq: Two times a day (BID) | ORAL | 1 refills | Status: DC

## 2020-02-23 ENCOUNTER — Other Ambulatory Visit: Payer: Self-pay

## 2020-02-23 DIAGNOSIS — G40909 Epilepsy, unspecified, not intractable, without status epilepticus: Secondary | ICD-10-CM

## 2020-02-26 MED ORDER — LEVETIRACETAM 750 MG PO TABS: 750.0000 mg | ORAL_TABLET | Freq: Two times a day (BID) | ORAL | 1 refills | Status: DC

## 2020-02-26 NOTE — Telephone Encounter (Signed)
Looks like she just had a prescription filled.

## 2020-03-10 ENCOUNTER — Other Ambulatory Visit: Payer: Self-pay | Admitting: Internal Medicine

## 2020-03-10 DIAGNOSIS — I739 Peripheral vascular disease, unspecified: Secondary | ICD-10-CM

## 2020-03-11 NOTE — Telephone Encounter (Signed)
Patient will need a follow up appointment prior to refilling this prescription. She has not been evaluated for this medication since 2020.

## 2020-03-13 ENCOUNTER — Other Ambulatory Visit: Payer: Self-pay

## 2020-03-13 DIAGNOSIS — IMO0002 Reserved for concepts with insufficient information to code with codable children: Secondary | ICD-10-CM

## 2020-03-13 DIAGNOSIS — E1140 Type 2 diabetes mellitus with diabetic neuropathy, unspecified: Secondary | ICD-10-CM

## 2020-03-13 DIAGNOSIS — I5032 Chronic diastolic (congestive) heart failure: Secondary | ICD-10-CM

## 2020-03-13 MED ORDER — EMPAGLIFLOZIN 10 MG PO TABS: 10.0000 mg | ORAL_TABLET | Freq: Every day | ORAL | 1 refills | Status: DC

## 2020-03-14 NOTE — Telephone Encounter (Signed)
Patient has an appointment scheduled with Dr. Allyson Sabal on 04/08/2020 at 1:15 pm.

## 2020-04-08 ENCOUNTER — Encounter: Payer: Medicare Other | Admitting: Student

## 2020-04-15 ENCOUNTER — Ambulatory Visit (INDEPENDENT_AMBULATORY_CARE_PROVIDER_SITE_OTHER): Payer: Medicare Other | Admitting: Student

## 2020-04-15 VITALS — BP 167/70 | HR 91 | Temp 98.7°F | Wt 200.0 lb

## 2020-04-15 DIAGNOSIS — Z8673 Personal history of transient ischemic attack (TIA), and cerebral infarction without residual deficits: Secondary | ICD-10-CM

## 2020-04-15 DIAGNOSIS — E1140 Type 2 diabetes mellitus with diabetic neuropathy, unspecified: Secondary | ICD-10-CM | POA: Diagnosis not present

## 2020-04-15 DIAGNOSIS — E1122 Type 2 diabetes mellitus with diabetic chronic kidney disease: Secondary | ICD-10-CM

## 2020-04-15 DIAGNOSIS — Z794 Long term (current) use of insulin: Secondary | ICD-10-CM | POA: Diagnosis not present

## 2020-04-15 DIAGNOSIS — Z23 Encounter for immunization: Secondary | ICD-10-CM | POA: Diagnosis not present

## 2020-04-15 DIAGNOSIS — F3342 Major depressive disorder, recurrent, in full remission: Secondary | ICD-10-CM

## 2020-04-15 DIAGNOSIS — N183 Chronic kidney disease, stage 3 unspecified: Secondary | ICD-10-CM | POA: Diagnosis not present

## 2020-04-15 DIAGNOSIS — IMO0002 Reserved for concepts with insufficient information to code with codable children: Secondary | ICD-10-CM

## 2020-04-15 DIAGNOSIS — E1165 Type 2 diabetes mellitus with hyperglycemia: Secondary | ICD-10-CM

## 2020-04-15 DIAGNOSIS — I5032 Chronic diastolic (congestive) heart failure: Secondary | ICD-10-CM | POA: Diagnosis not present

## 2020-04-15 DIAGNOSIS — E1159 Type 2 diabetes mellitus with other circulatory complications: Secondary | ICD-10-CM

## 2020-04-15 DIAGNOSIS — I10 Essential (primary) hypertension: Secondary | ICD-10-CM | POA: Diagnosis not present

## 2020-04-15 DIAGNOSIS — I739 Peripheral vascular disease, unspecified: Secondary | ICD-10-CM

## 2020-04-15 DIAGNOSIS — I152 Hypertension secondary to endocrine disorders: Secondary | ICD-10-CM | POA: Diagnosis not present

## 2020-04-15 LAB — POCT GLYCOSYLATED HEMOGLOBIN (HGB A1C): Hemoglobin A1C: 9.1 % — AB (ref 4.0–5.6)

## 2020-04-15 LAB — GLUCOSE, CAPILLARY: Glucose-Capillary: 219 mg/dL — ABNORMAL HIGH (ref 70–99)

## 2020-04-15 MED ORDER — CARVEDILOL 6.25 MG PO TABS: 6.2500 mg | ORAL_TABLET | Freq: Two times a day (BID) | ORAL | 3 refills | Status: DC

## 2020-04-15 MED ORDER — CILOSTAZOL 100 MG PO TABS: 100.0000 mg | ORAL_TABLET | Freq: Two times a day (BID) | ORAL | 1 refills | Status: DC

## 2020-04-15 MED ORDER — ASPIRIN EC 81 MG PO TBEC: 81.0000 mg | DELAYED_RELEASE_TABLET | Freq: Every day | ORAL | 2 refills | Status: DC

## 2020-04-15 MED ORDER — ACETAMINOPHEN-CODEINE #3 300-30 MG PO TABS: 1.0000 | ORAL_TABLET | Freq: Every day | ORAL | 3 refills | Status: DC | PRN

## 2020-04-15 MED ORDER — EMPAGLIFLOZIN 10 MG PO TABS: 10.0000 mg | ORAL_TABLET | Freq: Every day | ORAL | 1 refills | Status: DC

## 2020-04-15 MED ORDER — LOSARTAN POTASSIUM 25 MG PO TABS: 25.0000 mg | ORAL_TABLET | Freq: Every day | ORAL | 1 refills | Status: DC

## 2020-04-15 MED ORDER — SOLIQUA 100-33 UNT-MCG/ML ~~LOC~~ SOPN: 30.0000 [IU] | PEN_INJECTOR | Freq: Every day | SUBCUTANEOUS | 11 refills | Status: DC

## 2020-04-15 MED ORDER — FLUOXETINE HCL 10 MG PO CAPS: 10.0000 mg | ORAL_CAPSULE | Freq: Every day | ORAL | 1 refills | Status: DC

## 2020-04-15 MED ORDER — ROSUVASTATIN CALCIUM 20 MG PO TABS
20.0000 mg | ORAL_TABLET | Freq: Every day | ORAL | 1 refills | Status: DC
Start: 2020-04-15 — End: 2020-11-11

## 2020-04-15 NOTE — Progress Notes (Signed)
   CC: annual checkup and medication refills  HPI:  [name] is a 87 y.o. female with history listed below presenting to the [facility] for an annual checkup and medication refills. Please see individualized problem based charting for full HPI.  Past Medical History:  Diagnosis Date  . Adenocarcinoma of lung ([place])     Right upper lobe adenocarcinoma. s/p right lower lobectomy 12/15/10  . ARTHRITIS, KNEE 03/24/2006  . CHF (congestive heart failure) ([place])   . CVA (cerebrovascular accident) ([place]) 2585    right embolic stroke, no residual deficits  . Depression 12/02/2010  . DIABETES MELLITUS, TYPE II 2000  . Diverticulitis   . GERD 12/04/2005  . Hemangioma of liver 12/02/2010  . HYPERLIPIDEMIA 12/04/2005  . HYPERTENSION 12/04/2005  . Meningioma ([place]) 07/21/2010    Review of Systems:  Negative aside from that listed in individualized problem based charting.  Physical Exam:  Vitals:   04/15/20 1505 04/15/20 1509  BP: (!) 160/67 (!) 167/70  Pulse: 99 91  Temp: 98.7 F (37.1 C)   TempSrc: Oral   SpO2: 99%   Weight: 200 lb (90.7 kg)    Physical Exam Constitutional:      Appearance: She is obese. She is not ill-appearing.  HENT:     Head: Normocephalic and atraumatic.     Nose: Nose normal. No congestion.     Mouth/Throat:     Mouth: Mucous membranes are moist.     Pharynx: Oropharynx is clear. No oropharyngeal exudate.  Cardiovascular:     Rate and Rhythm: Normal rate and regular rhythm.     Pulses: Normal pulses.     Heart sounds: Normal heart sounds. No murmur heard. No friction rub. No gallop.   Pulmonary:     Effort: Pulmonary effort is normal.     Breath sounds: Normal breath sounds. No wheezing, rhonchi or rales.  Abdominal:     General: Bowel sounds are normal. There is no distension.     Palpations: Abdomen is soft.     Tenderness: There is no abdominal tenderness.  Musculoskeletal:        General: No swelling. Normal range of motion.     Cervical back:  Normal range of motion.  Skin:    General: Skin is warm and dry.  Neurological:     General: No focal deficit present.     Mental Status: She is alert and oriented to person, place, and time.  Psychiatric:        Mood and Affect: Mood normal.        Behavior: Behavior normal.      Assessment & Plan:   See Encounters Tab for problem based charting.  Patient discussed with Dr. Heber Pennock

## 2020-04-15 NOTE — Assessment & Plan Note (Signed)
Obtaining BMP to monitor kidney function in the setting of recently uncontrolled T2DM.  -f/u BMP

## 2020-04-15 NOTE — Patient Instructions (Signed)
[name],  It was a pleasure seeing you in the clinic today.   As we discussed,  1. I have refilled all of your medications. Please make sure you take them as directed.  2. Please come back in 1 month so that we can make sure your blood pressure is controlled.  Please call our clinic at [phone] if you have any questions or concerns. The best time to call is Monday-Friday from 9am-4pm, but there is someone available 24/7 at the same number. If you need medication refills, please notify your pharmacy one week in advance and they will send [place] a request.   Thank you for letting us take part in your care. We look forward to seeing you next time!

## 2020-04-15 NOTE — Assessment & Plan Note (Signed)
Patient with history of HTN, on coreg 6.25mg  BID and cozaar 25mg . BP today 160/67 with repeat 167/70. Daughter is with patient and endorses that patient has ran out of most of her medications for weeks to months. This is likely why patient's BP is elevated to this degree. I have refilled her medications for her so that we can control her BP again. Her last office visit was about a year ago so I emphasized to patient and daughter the importance of consistent follow up to ensure that her HTN is well controlled. They confirm understanding and agree with plan.  Plan: -refilled cozaar and coreg -reassess BP in 1 month and adjust medications as needed

## 2020-04-15 NOTE — Assessment & Plan Note (Addendum)
Patient with history of T2DM, on soliqua 30 units daily and jardiance 10mg  daily. Her HbA1c today is 9.1, which is significantly higher than prior HbA1c of 7.8. Patient's daughter endorses that patient is out of all of her diabetic medications for weeks to months now, which would explain why her diabetes is now uncontrolled. At this time, will refill home jardiance and soliqua at same dosing to control her diabetes. I do not want to increase her diabetic medications due to risk of hypoglycemia so will assess response to therapy with current medications. Of note, metformin was stopped in the past due to GI side effects. I emphasized to patient and daughter about importance of consistent follow up to ensure that diabetes remains controlled, especially in context of concomitant CKD stage III, HTN, and HLD. Also will obtain BMP today to evaluate electrolytes and kidney function.  Plan: -refilled jardiance and soliqua -f/u in 3 months for repeat HbA1c and adjust medications as needed -f/u BMP

## 2020-04-16 LAB — BMP8+ANION GAP
Anion Gap: 14 mmol/L (ref 10.0–18.0)
BUN/Creatinine Ratio: 20 (ref 12–28)
BUN: 18 mg/dL (ref 8–27)
CO2: 25 mmol/L (ref 20–29)
Calcium: 9.2 mg/dL (ref 8.7–10.3)
Chloride: 103 mmol/L (ref 96–106)
Creatinine, Ser: 0.9 mg/dL (ref 0.57–1.00)
Glucose: 251 mg/dL — ABNORMAL HIGH (ref 65–99)
Potassium: 4.2 mmol/L (ref 3.5–5.2)
Sodium: 142 mmol/L (ref 134–144)
eGFR: 63 mL/min/{1.73_m2} (ref >59)

## 2020-04-19 NOTE — Progress Notes (Signed)
Internal Medicine Clinic Attending  Case discussed with Dr. Jinwala  At the time of the visit.  We reviewed the resident's history and exam and pertinent patient test results.  I agree with the assessment, diagnosis, and plan of care documented in the resident's note.  

## 2020-05-08 ENCOUNTER — Encounter: Payer: Self-pay | Admitting: Dietician

## 2020-05-16 ENCOUNTER — Encounter: Payer: Medicare Other | Admitting: Internal Medicine

## 2020-05-16 NOTE — Progress Notes (Deleted)
   CC: ***  This is a telephone encounter between [name] and [name] on 05/16/2020 for ***. The visit was conducted with the patient located at {NAMES:[id number]::"home"} and Raybon Conard A Baxter Gonzalez at {NAMES:3044014::"[facility]","Hospital","Home"}. The patient's identity was confirmed using their DOB and current address. The {WHO:3044014::"patient","his/her legal guardian","***"} has consented to being evaluated through a telephone encounter and understands the associated risks (an examination cannot be done and the patient may need to come in for an appointment) / benefits (allows the patient to remain at home, decreasing exposure to coronavirus). I personally spent {Numbers; 0-31:[id number]} minutes on medical discussion.   HPI:  [name] is a 87 y.o. with PMH as below.   Please see A&P for assessment of the patient's acute and chronic medical conditions.   Past Medical History:  Diagnosis Date  . Adenocarcinoma of lung ([place])     Right upper lobe adenocarcinoma. s/p right lower lobectomy 12/15/10  . ARTHRITIS, KNEE 03/24/2006  . CHF (congestive heart failure) ([place])   . CVA (cerebrovascular accident) ([place]) 0045    right embolic stroke, no residual deficits  . Depression 12/02/2010  . DIABETES MELLITUS, TYPE II 2000  . Diverticulitis   . GERD 12/04/2005  . Hemangioma of liver 12/02/2010  . HYPERLIPIDEMIA 12/04/2005  . HYPERTENSION 12/04/2005  . Meningioma ([place]) 07/21/2010   Review of Systems:  ***    Assessment & Plan:   See Encounters Tab for problem based charting.  Patient {GC/GE:3044014::"discussed with","seen with"} Dr. {NAMES:3044014::"Butcher","Granfortuna","E. Hoffman","Klima","Mullen","Narendra","Raines","Vincent"}

## 2020-05-21 ENCOUNTER — Encounter: Payer: Self-pay | Admitting: Student

## 2020-05-21 ENCOUNTER — Ambulatory Visit (INDEPENDENT_AMBULATORY_CARE_PROVIDER_SITE_OTHER): Payer: Medicare Other | Admitting: Student

## 2020-05-21 DIAGNOSIS — I152 Hypertension secondary to endocrine disorders: Secondary | ICD-10-CM

## 2020-05-21 DIAGNOSIS — E1140 Type 2 diabetes mellitus with diabetic neuropathy, unspecified: Secondary | ICD-10-CM | POA: Diagnosis not present

## 2020-05-21 DIAGNOSIS — E1159 Type 2 diabetes mellitus with other circulatory complications: Secondary | ICD-10-CM | POA: Diagnosis not present

## 2020-05-21 DIAGNOSIS — IMO0002 Reserved for concepts with insufficient information to code with codable children: Secondary | ICD-10-CM

## 2020-05-21 DIAGNOSIS — E1165 Type 2 diabetes mellitus with hyperglycemia: Secondary | ICD-10-CM | POA: Diagnosis not present

## 2020-05-21 DIAGNOSIS — Z794 Long term (current) use of insulin: Secondary | ICD-10-CM | POA: Diagnosis not present

## 2020-05-21 DIAGNOSIS — M1711 Unilateral primary osteoarthritis, right knee: Secondary | ICD-10-CM | POA: Diagnosis not present

## 2020-05-21 LAB — GLUCOSE, CAPILLARY: Glucose-Capillary: 199 mg/dL — ABNORMAL HIGH (ref 70–99)

## 2020-05-21 NOTE — Patient Instructions (Signed)
Thank you, [name] for allowing [place] to provide your care today. Today we we discussed your blood pressure which has significantly improved since going back on your medications.  I will talk to our staff to determine how we can get you more personal care services.  .   I have ordered the following labs for you:   Lab Orders     Glucose, capillary   Please follow-up in 2 months for A1c check  Should you have any questions or concerns please call the internal medicine clinic at [phone].    [name] Dibbles, MD, MPH [facility]   My Chart Access: [email or web address]   If you have not already done so, please get your COVID 19 vaccine  To schedule an appointment for a COVID vaccine choice any of the following: Go to [email or web address]   Go to [email or web address]                  Call [phone]                                     Call [phone] and select Option 2

## 2020-05-21 NOTE — Assessment & Plan Note (Addendum)
Patient has chronic osteoarthritis of the right knee.  Per daughter, patient almost fell this morning while coming down the stairs.  Patient has an Aide that is covered by Medicaid but she only spent 2-3 hrs with patient each day.  Daughter will like to have more support for family so she can have increased supervision in case patient falls. --Continue Tylenol #3 daily as needed for knee pain --Plan to restart patient's personal care services documentation. --Follow-up with [place] about documentation.

## 2020-05-21 NOTE — Progress Notes (Signed)
   CC: BP follow-up  HPI:  [name] is a 87 y.o. female with PMH as below who presents for follow-up on her hypertension. Please see problem based charting for evaluation, assessment and plan.  Past Medical History:  Diagnosis Date  . Adenocarcinoma of lung ([place])     Right upper lobe adenocarcinoma. s/p right lower lobectomy 12/15/10  . ARTHRITIS, KNEE 03/24/2006  . CHF (congestive heart failure) ([place])   . CVA (cerebrovascular accident) ([place]) 9675    right embolic stroke, no residual deficits  . Depression 12/02/2010  . DIABETES MELLITUS, TYPE II 2000  . Diverticulitis   . GERD 12/04/2005  . Hemangioma of liver 12/02/2010  . HYPERLIPIDEMIA 12/04/2005  . HYPERTENSION 12/04/2005  . Meningioma ([place]) 07/21/2010    Review of Systems:  Constitutional: Negative for fever or fatigue. Eyes: Negative for visual changes Respiratory: Negative for shortness of breath Cardiac: Negative for chest pain MSK: Negative for back pain. Positive for chronic knee pain Neuro: Negative for headache.  Positive for dizziness  Physical Exam: General: Pleasant, well-appearing elderly woman sitting in a wheelchair. No acute distress. Cardiac: RRR. No murmurs, rubs or gallops. No LE edema Respiratory: Lungs CTAB. No wheezing or crackles. Abdominal: Soft, symmetric and non tender. Normal BS. Skin: Warm, dry and intact without rashes or lesions Extremities: Atraumatic. Full ROM. Pulse palpable. Neuro: A&O x 3. Moves all extremities Psych: Appropriate mood and affect.  Vitals:   05/21/20 1413  BP: 133/64  Pulse: 84  SpO2: 97%  Weight: 190 lb 11.2 oz (86.5 kg)     Assessment & Plan:   See Encounters Tab for problem based charting.  Patient discussed with Dr. Lockie Pares, MD, MPH

## 2020-05-21 NOTE — Assessment & Plan Note (Signed)
Patient back on her insulin regimen at home.  According to daughter, patient does her own injections and uses a meter to record her blood sugar.  CBG today is 199.  Plan to continue patient on current antidiabetic regiment and have patient follow-up in 2 months for repeat A1c.  Patient and family also advised to bring meter to this appointment. --Continue Jardiance and Soliqua --BMP check in 2 months

## 2020-05-21 NOTE — Assessment & Plan Note (Addendum)
Patient presents today for follow-up on her hypertension.  Patient's BP has significantly improved to 133/64 in clinic after going back on her antihypertensive regimen.  Patient does endorse occasional " swimmy headed " that is not postural in nature. He denies any headache, SOB, CP or visual change. We will continue current antihypertensive regimen and monitor closely.  Plan: --Continue Coreg 6.25 mg twice daily --Continue Cozaar 25 mg daily --Follow-up in 2 months for BMP check.

## 2020-05-22 NOTE — Progress Notes (Signed)
Internal Medicine Clinic Attending  Case discussed with Dr. Amponsah  At the time of the visit.  We reviewed the resident's history and exam and pertinent patient test results.  I agree with the assessment, diagnosis, and plan of care documented in the resident's note.  

## 2020-05-23 ENCOUNTER — Telehealth: Payer: Self-pay | Admitting: *Deleted

## 2020-05-23 NOTE — Telephone Encounter (Signed)
CALLED PATIENT LVM FOR [name] TO RETURN CALL TO THE CLINIC AT [phone].

## 2020-06-04 ENCOUNTER — Telehealth: Payer: Self-pay | Admitting: *Deleted

## 2020-06-04 NOTE — Telephone Encounter (Signed)
PCS forms faxed to [facility]conformation 06-03-2020@17 :18pm) Phone [phone] / Fax [phone]. Patient was contacted (spoke with daughter) to inform her that PCS forms has been sent.

## 2020-06-05 ENCOUNTER — Telehealth: Payer: Self-pay | Admitting: *Deleted

## 2020-06-05 NOTE — Telephone Encounter (Signed)
SPOKE [place]) REGARDING  TOTAL HOUR PER MONTH . PATIENT IS GETTING 80 hours PER MONTH.  RECEIVED FAXED FROM [phone]) THAT REQUEST IS NOT VALID REQUEST.  SPOKE WITH MS Marohl DAUGHTER AND INFORMED HER THAT HER MOM IS RECEIVING THE MAX OF 80 HOURS A MONTH.

## 2020-06-18 ENCOUNTER — Telehealth: Payer: Self-pay | Admitting: *Deleted

## 2020-06-18 NOTE — Telephone Encounter (Signed)
Received faxed request for order, CMN, and OV notes addressing need for urinary incontinence supplies from Amy H. at [facility]. This has not been addressed since 07/27/2019. Will route to [employer] for assistance scheduling patient to discuss need. This may be done by telehealth.

## 2020-07-04 ENCOUNTER — Ambulatory Visit (INDEPENDENT_AMBULATORY_CARE_PROVIDER_SITE_OTHER): Payer: Medicare Other | Admitting: Internal Medicine

## 2020-07-04 ENCOUNTER — Other Ambulatory Visit: Payer: Self-pay

## 2020-07-04 DIAGNOSIS — N3946 Mixed incontinence: Secondary | ICD-10-CM | POA: Diagnosis not present

## 2020-07-04 NOTE — Assessment & Plan Note (Signed)
>>  ASSESSMENT AND PLAN FOR MIXED INCONTINENCE WRITTEN ON 07/04/2020  2:49 PM BY Claudean Severance, MD  87 year old female with a history of HFpEF, hypertension, PAD, hyperlipidemia, seizure disorder, diabetes, depression, and urinary incontinence is likely mixed secondary to urgency and functional incontinence who is presenting for evaluation of need for urinary incontinence supplies.Spoke with daughter, patient uses incontinence supplies day and night. She has some difficulty with getting up and going to the bathroom quick enough, and it will occasionally overflow. -Order incontinence supplies

## 2020-07-04 NOTE — Assessment & Plan Note (Signed)
87 year old female with a history of HFpEF, hypertension, PAD, hyperlipidemia, seizure disorder, diabetes, depression, and urinary incontinence is likely mixed secondary to urgency and functional incontinence who is presenting for evaluation of need for urinary incontinence supplies.Spoke with daughter, patient uses incontinence supplies day and night. She has some difficulty with getting up and going to the bathroom quick enough, and it will occasionally overflow. -Order incontinence supplies

## 2020-07-04 NOTE — Progress Notes (Signed)
  [facility] Internal Medicine Residency Telephone Encounter Continuity Care Appointment  HPI:   This telephone encounter was created for [name] on 07/04/2020 for the following purpose/cc urinary incontinence and need for supplies.  87 year old female with a history of HFpEF, hypertension, PAD, hyperlipidemia, seizure disorder, diabetes, depression, and urinary incontinence is likely mixed secondary to urgency and functional incontinence who is presenting for evaluation of need for urinary incontinence supplies.Spoke with daughter, uses incontinence supplies day and night. She needs to use the bathroom it will leak out. Has been using them for about 2 years, used to buy out of pocket and became costly. Chronic urinary incontinence. Reports chronic urinary incontinence.    Past Medical History:  Past Medical History:  Diagnosis Date  . Adenocarcinoma of lung ([place])     Right upper lobe adenocarcinoma. s/p right lower lobectomy 12/15/10  . ARTHRITIS, KNEE 03/24/2006  . CHF (congestive heart failure) ([place])   . CVA (cerebrovascular accident) ([employer]) 2130    right embolic stroke, no residual deficits  . Depression 12/02/2010  . DIABETES MELLITUS, TYPE II 2000  . Diverticulitis   . GERD 12/04/2005  . Hemangioma of liver 12/02/2010  . HYPERLIPIDEMIA 12/04/2005  . HYPERTENSION 12/04/2005  . Meningioma ([place]) 07/21/2010      ROS:  Constitutional: Negative for chills and fever.  Respiratory: Negative for shortness of breath.   Cardiovascular: Negative for chest pain and leg swelling.  Gastrointestinal: Negative for abdominal pain, nausea and vomiting.   Neurological: Negative for dizziness and headaches.    Assessment / Plan / Recommendations:   Please see A&P under problem oriented charting for assessment of the patient's acute and chronic medical conditions.   As always, pt is advised that if symptoms worsen or new symptoms arise, they should go to an urgent care facility or to  to ER for further evaluation.   Consent and Medical Decision Making:   Patient discussed with Dr. Evette Doffing  This is a telephone encounter between [name] and Asencion Noble on 07/04/2020 for urinary incontinence. The visit was conducted with the patient located at home and Asencion Noble at [facility]. The patient's identity was confirmed using their DOB and current address. The his/her legal guardian has consented to being evaluated through a telephone encounter and understands the associated risks (an examination cannot be done and the patient may need to come in for an appointment) / benefits (allows the patient to remain at home, decreasing exposure to coronavirus). I personally spent 6 minutes on medical discussion.

## 2020-07-05 NOTE — Telephone Encounter (Signed)
Order, CMN, and OV notes from 07/04/20 addressing need for urinary incontinence supplies faxed to [place] at [employer]. Fax confirmation receipt received.

## 2020-07-08 NOTE — Progress Notes (Signed)
Internal Medicine Clinic Attending ° °Case discussed with Dr. Krienke  At the time of the visit.  We reviewed the resident’s history and exam and pertinent patient test results.  I agree with the assessment, diagnosis, and plan of care documented in the resident’s note.  °

## 2020-07-11 ENCOUNTER — Other Ambulatory Visit: Payer: Self-pay

## 2020-07-11 DIAGNOSIS — I739 Peripheral vascular disease, unspecified: Secondary | ICD-10-CM

## 2020-07-11 NOTE — Telephone Encounter (Signed)
Pt's daughter states pharmacy cannot filled the Rx for   acetaminophen-codeine (TYLENOL #3) 300-30 MG tablet, requesting another provider. Please call back.

## 2020-07-12 MED ORDER — ACETAMINOPHEN-CODEINE #3 300-30 MG PO TABS: 1.0000 | ORAL_TABLET | Freq: Every day | ORAL | 1 refills | Status: DC | PRN

## 2020-07-22 ENCOUNTER — Telehealth: Payer: Self-pay

## 2020-07-22 ENCOUNTER — Other Ambulatory Visit: Payer: Medicare Other

## 2020-07-22 NOTE — Telephone Encounter (Signed)
It appears that patient was intended to have BMP and hemoglobin A1c collected two months following her office visit in April. Patient may benefit from regular follow-up in clinic, however if patient and patient's family prefer a lab-only appointment instead then that is a reasonable alternative.

## 2020-07-22 NOTE — Telephone Encounter (Signed)
Red Team, Please advise:  Patient had an appt for lab only visit on 07/22/20 which was cancelled by her daughter.  There were no future lab orders placed for this appt.  Please review office visit note from 05/21/20 and clarify what type of an appt you would like patient to have.  Do you want patient to f/u in clinic as well?  Thank you, SChaplin, RN,BSN

## 2020-07-23 ENCOUNTER — Other Ambulatory Visit: Payer: Self-pay | Admitting: Student

## 2020-07-23 DIAGNOSIS — IMO0002 Reserved for concepts with insufficient information to code with codable children: Secondary | ICD-10-CM

## 2020-07-23 DIAGNOSIS — E1159 Type 2 diabetes mellitus with other circulatory complications: Secondary | ICD-10-CM

## 2020-07-23 DIAGNOSIS — E1140 Type 2 diabetes mellitus with diabetic neuropathy, unspecified: Secondary | ICD-10-CM

## 2020-07-23 DIAGNOSIS — I152 Hypertension secondary to endocrine disorders: Secondary | ICD-10-CM

## 2020-07-23 NOTE — Telephone Encounter (Signed)
Future orders have been placed, thank you all

## 2020-07-23 NOTE — Telephone Encounter (Signed)
TC to [facility], daughter [name], she was offered to reschedule lab appt from yesterday and also given Dr. Durenda Age instructions that patient may benefit from a clinic visit as well.  Daughter declines clinic visit and only wants to bring in her mother for labs.  She states d/t her work schedule, she is requesting a lab appt on 08/06/20.  Appt made for 1:15 at 08/06/20, lab only.  Dr. Wynetta Emery, Will you please place the future lab orders. Thank you, Mirage Pfefferkorn

## 2020-08-06 ENCOUNTER — Other Ambulatory Visit (INDEPENDENT_AMBULATORY_CARE_PROVIDER_SITE_OTHER): Payer: Medicare Other

## 2020-08-06 DIAGNOSIS — E1159 Type 2 diabetes mellitus with other circulatory complications: Secondary | ICD-10-CM

## 2020-08-06 DIAGNOSIS — E1165 Type 2 diabetes mellitus with hyperglycemia: Secondary | ICD-10-CM

## 2020-08-06 DIAGNOSIS — IMO0002 Reserved for concepts with insufficient information to code with codable children: Secondary | ICD-10-CM

## 2020-08-06 DIAGNOSIS — I152 Hypertension secondary to endocrine disorders: Secondary | ICD-10-CM | POA: Diagnosis not present

## 2020-08-06 DIAGNOSIS — E1140 Type 2 diabetes mellitus with diabetic neuropathy, unspecified: Secondary | ICD-10-CM | POA: Diagnosis not present

## 2020-08-06 DIAGNOSIS — Z794 Long term (current) use of insulin: Secondary | ICD-10-CM | POA: Diagnosis not present

## 2020-08-06 LAB — POCT GLYCOSYLATED HEMOGLOBIN (HGB A1C): Hemoglobin A1C: 7.2 % — AB (ref 4.0–5.6)

## 2020-08-06 LAB — GLUCOSE, CAPILLARY: Glucose-Capillary: 195 mg/dL — ABNORMAL HIGH (ref 70–99)

## 2020-08-07 LAB — BMP8+ANION GAP
Anion Gap: 18 mmol/L (ref 10.0–18.0)
BUN/Creatinine Ratio: 17 (ref 12–28)
BUN: 17 mg/dL (ref 8–27)
CO2: 23 mmol/L (ref 20–29)
Calcium: 8.9 mg/dL (ref 8.7–10.3)
Chloride: 104 mmol/L (ref 96–106)
Creatinine, Ser: 1 mg/dL (ref 0.57–1.00)
Glucose: 215 mg/dL — ABNORMAL HIGH (ref 65–99)
Potassium: 3.6 mmol/L (ref 3.5–5.2)
Sodium: 145 mmol/L — ABNORMAL HIGH (ref 134–144)
eGFR: 56 mL/min/{1.73_m2} — ABNORMAL LOW (ref >59)

## 2020-08-27 ENCOUNTER — Other Ambulatory Visit: Payer: Self-pay | Admitting: Student

## 2020-08-27 DIAGNOSIS — I10 Essential (primary) hypertension: Secondary | ICD-10-CM

## 2020-08-27 DIAGNOSIS — E1140 Type 2 diabetes mellitus with diabetic neuropathy, unspecified: Secondary | ICD-10-CM

## 2020-08-27 DIAGNOSIS — IMO0002 Reserved for concepts with insufficient information to code with codable children: Secondary | ICD-10-CM

## 2020-08-27 DIAGNOSIS — I739 Peripheral vascular disease, unspecified: Secondary | ICD-10-CM

## 2020-09-04 ENCOUNTER — Other Ambulatory Visit: Payer: Self-pay | Admitting: Internal Medicine

## 2020-09-04 DIAGNOSIS — G40909 Epilepsy, unspecified, not intractable, without status epilepticus: Secondary | ICD-10-CM

## 2020-09-10 ENCOUNTER — Encounter: Payer: Medicare Other | Admitting: Student

## 2020-09-11 ENCOUNTER — Telehealth: Payer: Self-pay | Admitting: *Deleted

## 2020-09-11 NOTE — Telephone Encounter (Signed)
Call from pt's daughter who stated the doctor had called the pt yesterday and was told she needs an in-person appt for today. Informed the daughter we do not have an appts today but tomorrow. Stated she took off her job today; I wanted to know why the doctor told her this. And stated a nurse tod her she will call her back to schedule an appt - I do not see a telephone encounter in the chart. The daughter requested an appt for next week - call transferred to front office.

## 2020-09-17 ENCOUNTER — Ambulatory Visit (INDEPENDENT_AMBULATORY_CARE_PROVIDER_SITE_OTHER): Payer: Medicare Other | Admitting: Internal Medicine

## 2020-09-17 VITALS — BP 133/72 | HR 87 | Temp 98.5°F | Wt 188.5 lb

## 2020-09-17 DIAGNOSIS — R32 Unspecified urinary incontinence: Secondary | ICD-10-CM | POA: Diagnosis not present

## 2020-09-17 DIAGNOSIS — N3946 Mixed incontinence: Secondary | ICD-10-CM

## 2020-09-17 LAB — POCT URINALYSIS DIPSTICK
Bilirubin, UA: NEGATIVE
Glucose, UA: POSITIVE — AB
Ketones, UA: NEGATIVE
Nitrite, UA: NEGATIVE
Protein, UA: NEGATIVE
Spec Grav, UA: 1.02 (ref 1.010–1.025)
Urobilinogen, UA: 0.2 E.U./dL
pH, UA: 5 (ref 5.0–8.0)

## 2020-09-17 NOTE — Patient Instructions (Signed)
Thank you for trusting me with your care. To recap, today we discussed the following:   Urinary incontinence - Your urine test did not suggest infection. You are likely urinating more frequently due to a medication which controls your diabetes. We will not make any changes today after discussing this with you.

## 2020-09-18 ENCOUNTER — Encounter: Payer: Self-pay | Admitting: Internal Medicine

## 2020-09-18 NOTE — Progress Notes (Signed)
   CC: urinary incontinence  HPI:[name] is a 87 y.o. female who presents for evaluation of urinary incontinence. Please see individual problem based A/P for details.   Past Medical History:  Diagnosis Date   Adenocarcinoma of lung ([place])     Right upper lobe adenocarcinoma. s/p right lower lobectomy 12/15/10   ARTHRITIS, KNEE 03/24/2006   CHF (congestive heart failure) ([place])    CVA (cerebrovascular accident) ([place]) 9563    right embolic stroke, no residual deficits   Depression 12/02/2010   DIABETES MELLITUS, TYPE II 2000   Diverticulitis    GERD 12/04/2005   Hemangioma of liver 12/02/2010   HYPERLIPIDEMIA 12/04/2005   HYPERTENSION 12/04/2005   Meningioma ([place]) 07/21/2010   Review of Systems:   Review of Systems  Constitutional:  Negative for chills and fever.  Genitourinary:  Positive for frequency. Negative for dysuria, flank pain and urgency.    Physical Exam: Vitals:   09/17/20 1556  BP: 133/72  Pulse: 87  Temp: 98.5 F (36.9 C)  TempSrc: Oral  SpO2: 98%  Weight: 188 lb 8 oz (85.5 kg)     General: Elderly women sitting in wheel chair, she can only hear shouting in close proximity to her ear HEENT: antiicteric sclerae, moist mucous membranes, no exudate or erythema Cardiovascular: Normal rate, regular rhythm.  No murmurs, rubs, or gallops Pulmonary : Equal breath sounds, No wheezes, rales, or rhonchi Abdominal: soft, nontender,  bowel sounds present Ext: No edema in lower extremities, no tenderness to palpation of lower extremities.   Assessment & Plan:   See Encounters Tab for problem based charting.  Patient discussed with Dr. Angelia Mould

## 2020-09-18 NOTE — Progress Notes (Signed)
Internal Medicine Clinic Attending  Case discussed with Dr. Steen  At the time of the visit.  We reviewed the resident's history and exam and pertinent patient test results.  I agree with the assessment, diagnosis, and plan of care documented in the resident's note.  

## 2020-09-18 NOTE — Assessment & Plan Note (Addendum)
Patient is presenting for evaluation of urinary incontinence. Daughter says patient reported urinary incontinence to her so she made an appointment. On review of chart she has a diagnosis of mixed incontinence for at least 7 years, and patient was referred to urology. Although I can not see any notes under media tab or in chart review. She has been managing this with adult briefs. Patient denies any dysuria. Endorses this is her same chronic issue. Patient has bilateral hearing loss which they are saving for hearing aids/ setting up a payment plan with company. I expect this would be first priority to increase quality of life. Check urine, noted glucosuria, but does not suggest acute infection. We had a shared decision on continuing Jardiance which may be contributing to increase urination. This medication does have benefits for heart disease , diabetes, and kidney disease.  After discussing with daughter decision was made for no further testing or interventions at this time. If patient expresses this causing issues we can further evaluate with pelvic exam and possible referral to urology.

## 2020-09-18 NOTE — Assessment & Plan Note (Signed)
>>  ASSESSMENT AND PLAN FOR MIXED INCONTINENCE WRITTEN ON 09/18/2020  7:18 AM BY Albertha Ghee, MD  Patient is presenting for evaluation of urinary incontinence. Daughter says patient reported urinary incontinence to her so she made an appointment. On review of chart she has a diagnosis of mixed incontinence for at least 7 years, and patient was referred to urology. Although I can not see any notes under media tab or in chart review. She has been managing this with adult briefs. Patient denies any dysuria. Endorses this is her same chronic issue. Patient has bilateral hearing loss which they are saving for hearing aids/ setting up a payment plan with company. I expect this would be first priority to increase quality of life. Check urine, noted glucosuria, but does not suggest acute infection. We had a shared decision on continuing Jardiance which may be contributing to increase urination. This medication does have benefits for heart disease , diabetes, and kidney disease.  After discussing with daughter decision was made for no further testing or interventions at this time. If patient expresses this causing issues we can further evaluate with pelvic exam and possible referral to urology.

## 2020-10-03 ENCOUNTER — Other Ambulatory Visit: Payer: Self-pay | Admitting: Student

## 2020-10-03 DIAGNOSIS — F3342 Major depressive disorder, recurrent, in full remission: Secondary | ICD-10-CM

## 2020-10-07 ENCOUNTER — Other Ambulatory Visit: Payer: Self-pay | Admitting: Student in an Organized Health Care Education/Training Program

## 2020-10-07 DIAGNOSIS — I739 Peripheral vascular disease, unspecified: Secondary | ICD-10-CM

## 2020-10-10 NOTE — Telephone Encounter (Signed)
Pt's daughter states the pharmacy sent refill request on 10/07/2020, and the clinic has not reply back. Requesting the medication to be filled by today.   Requesting refill on acetaminophen-codeine (TYLENOL #3) 300-30 MG tablet @ [id number] [place] [place] - [address].

## 2020-10-17 ENCOUNTER — Other Ambulatory Visit: Payer: Self-pay | Admitting: Student

## 2020-10-17 DIAGNOSIS — IMO0002 Reserved for concepts with insufficient information to code with codable children: Secondary | ICD-10-CM

## 2020-10-17 DIAGNOSIS — E1140 Type 2 diabetes mellitus with diabetic neuropathy, unspecified: Secondary | ICD-10-CM

## 2020-10-17 NOTE — Telephone Encounter (Signed)
Pt moved and has misplaced her Meter and needles and is requesting a new Refill if possible for the following:   Blood Glucose Monitoring Suppl (ACCU-CHEK GUIDE) w/Device KIT  ACCU-CHEK FASTCLIX LANCETS MISC  glucose blood (ACCU-CHEK GUIDE) test strip  [id number] [facility] [address](Ph: [phone]

## 2020-10-18 ENCOUNTER — Telehealth: Payer: Self-pay | Admitting: *Deleted

## 2020-10-18 NOTE — Telephone Encounter (Signed)
Sacral hydrocolloid bandage with waffle tape given to patient's daughter with instructions on use. Confirmed patient's appt for 9/7 at 2:15.

## 2020-10-18 NOTE — Telephone Encounter (Signed)
Patient's daughter called in stating patient is coherent but has been lying in bed for the past 2 days. States there is a pink area on her bottom right above the crease. Explained this is due to pressure of staying in same spot all day. Encouraged patient to get up but if unable, to make sure she is turning side to side at least every 2 hours. Offered her a sacral bandage. Daughter will come pick this up today. She is aware our office closes at noon today.

## 2020-10-18 NOTE — Telephone Encounter (Signed)
Agree 

## 2020-10-20 ENCOUNTER — Telehealth: Payer: Self-pay | Admitting: Student

## 2020-10-20 NOTE — Telephone Encounter (Signed)
Received after-hours page for [name]. I talked with [name] during this encounter. She expressed concerns regarding [name]'s recent functional status. She states she has been largely bed-bound over the last two weeks. Prior to this, she was performing ADL's and was moving around the house. Mentions that over the last week she has had numerous bouts of diarrhea and has not been eating well, usually one small meal daily. She does note that stool has infiltrated into [name]'s sacral wound. She denies any nausea, vomiting, known fevers, or chills. She currently has an appointment with [facility] on 9/7. I discussed with [name] that given her poor po intake with recent diarrhea, there is concern for possible infection. In addition, her sharp function decline over the last two weeks is concerning. I have recommended [name]'s daughter to bring her into the Emergency Department for further evaluation. She agreed to this plan.  Sanjuan Dame, MD Internal Medicine PGY-2 [phone]

## 2020-10-22 ENCOUNTER — Telehealth: Payer: Self-pay | Admitting: *Deleted

## 2020-10-22 NOTE — Telephone Encounter (Signed)
Call from pt's daughter, [name], requesting an appt for today. Informed her there are no available appts today. Stated pt's private area is red and swollen and she has been having diarrhea and incontinent. She had called and talked to Dr Collene Gobble after hours who recommended bringing pt to the ED. She stated if she cannot go with pt to answer any questions there's no use. Informed they could call her  and/or after pt is checked in, she would be able to go back with pt. I also recommended going to ED at [facility] instead but she stated no transportation. She has an appt already schedule for tomorrow w/Dr Allyson Sabal @ 1415 PM.

## 2020-10-22 NOTE — Telephone Encounter (Deleted)
Call from pt's

## 2020-10-23 ENCOUNTER — Encounter: Payer: Self-pay | Admitting: Student

## 2020-10-23 ENCOUNTER — Other Ambulatory Visit: Payer: Self-pay

## 2020-10-23 ENCOUNTER — Ambulatory Visit (INDEPENDENT_AMBULATORY_CARE_PROVIDER_SITE_OTHER): Payer: Medicare Other | Admitting: Student

## 2020-10-23 ENCOUNTER — Other Ambulatory Visit (HOSPITAL_COMMUNITY)
Admission: RE | Admit: 2020-10-23 | Discharge: 2020-10-23 | Disposition: A | Discharge status: Home / Self Care | Payer: Medicare Other | Source: Ambulatory Visit | Attending: Internal Medicine | Admitting: Internal Medicine

## 2020-10-23 ENCOUNTER — Other Ambulatory Visit: Payer: Self-pay | Admitting: Internal Medicine

## 2020-10-23 VITALS — BP 132/63 | HR 95 | Temp 98.5°F | Resp 36 | Ht 64.0 in | Wt 176.3 lb

## 2020-10-23 DIAGNOSIS — N762 Acute vulvitis: Secondary | ICD-10-CM

## 2020-10-23 DIAGNOSIS — R32 Unspecified urinary incontinence: Secondary | ICD-10-CM

## 2020-10-23 DIAGNOSIS — I152 Hypertension secondary to endocrine disorders: Secondary | ICD-10-CM

## 2020-10-23 DIAGNOSIS — Z794 Long term (current) use of insulin: Secondary | ICD-10-CM

## 2020-10-23 DIAGNOSIS — E1159 Type 2 diabetes mellitus with other circulatory complications: Secondary | ICD-10-CM | POA: Diagnosis not present

## 2020-10-23 DIAGNOSIS — Z8673 Personal history of transient ischemic attack (TIA), and cerebral infarction without residual deficits: Secondary | ICD-10-CM | POA: Diagnosis not present

## 2020-10-23 DIAGNOSIS — G40909 Epilepsy, unspecified, not intractable, without status epilepticus: Secondary | ICD-10-CM | POA: Diagnosis not present

## 2020-10-23 DIAGNOSIS — E1140 Type 2 diabetes mellitus with diabetic neuropathy, unspecified: Secondary | ICD-10-CM

## 2020-10-23 DIAGNOSIS — IMO0002 Reserved for concepts with insufficient information to code with codable children: Secondary | ICD-10-CM

## 2020-10-23 DIAGNOSIS — E1165 Type 2 diabetes mellitus with hyperglycemia: Secondary | ICD-10-CM

## 2020-10-23 MED ORDER — ACCU-CHEK FASTCLIX LANCETS MISC
11 refills | Status: AC
Start: 2020-10-23 — End: ?

## 2020-10-23 MED ORDER — ACCU-CHEK GUIDE VI STRP: ORAL_STRIP | 3 refills | Status: AC

## 2020-10-23 MED ORDER — BARRIER CREAM NON-SPECIFIED: 1.0000 "application " | TOPICAL_CREAM | Freq: Two times a day (BID) | TOPICAL | 1 refills | Status: DC | PRN

## 2020-10-23 MED ORDER — ACCU-CHEK GUIDE W/DEVICE KIT
1.0000 | PACK | Freq: Three times a day (TID) | 0 refills | Status: AC
Start: 2020-10-23 — End: ?

## 2020-10-23 NOTE — Addendum Note (Signed)
Addended by: Truddie Crumble on: 10/23/2020 05:17 PM   Modules accepted: Orders

## 2020-10-23 NOTE — Addendum Note (Signed)
Addended by: Dorian Pod A on: 10/23/2020 05:41 PM   Modules accepted: Orders

## 2020-10-23 NOTE — Assessment & Plan Note (Addendum)
Unable to find her glucometer so reordered new glucometer and lancets. She is currently on [place] for glycemic control. Due for her next A1c in 2 weeks. Also due for diabetic eye exam, placed referral.  Plan: -continue jardiance and soliqua -reordered glucometer (check meter readings at next visit) -placed referral to ophthalmology for diabetic eye exam -f/u in 2 weeks for repeat A1c and BMP

## 2020-10-23 NOTE — Assessment & Plan Note (Signed)
Today's Vitals   10/23/20 1432  BP: 132/63  Pulse: 95  Resp: (!) 36  Temp: 98.5 F (36.9 C)  TempSrc: Oral  SpO2: 100%  Weight: 176 lb 4.8 oz (80 kg)  Height: 5\' 4"  (1.626 m)   Body mass index is 30.26 kg/m.  BP well controlled on coreg 6.25mg  BID and cozaar 25mg  daily.  -continue current medications

## 2020-10-23 NOTE — Assessment & Plan Note (Addendum)
Patient with at least one week history of redness and swelling around vulva. She had a recent diarrheal illness for the past week (now resolving as of yesterday) that likely caused irritation and inflammation of her vulva (she does have concomitant mixed urinary incontinence). On exam, vulva is significantly inflamed and red. Likely moisture-associated dermatitis of vulva in the setting of diarrheal illness. Advised patient and daughter to keep area clean and dry along with application of dry ice packs and barrier cream to help with inflammation and healing. Obtained cervicovaginal swab to test for yeast and BV.  Plan: -keep vulva clean and dry -apply dry ice packs as needed to help with inflammation -apply barrier cream twice daily -f/u cervicovaginal swab results -f/u in 2 weeks to evaluate for improvement

## 2020-10-23 NOTE — Patient Instructions (Addendum)
[name],  It was a pleasure seeing you in the clinic today.   Please keep your private area clean and dry using the pads (do not scrub, just pat dry). Please use dry ice packs to help with the inflammation. I have prescribed barrier cream to use twice daily in the affected area as well. I have ordered the bedside commode and the home health nurse to come and help you at home. I have placed a referred to the eye doctor for your annual eye exam. Please come back in 2 weeks for a follow up visit and to recheck your A1c.  Please call our clinic at [phone] if you have any questions or concerns. The best time to call is Monday-Friday from 9am-4pm, but there is someone available 24/7 at the same number. If you need medication refills, please notify your pharmacy one week in advance and they will send [place] a request.   Thank you for letting us take part in your care. We look forward to seeing you next time!

## 2020-10-23 NOTE — Progress Notes (Addendum)
   CC: redness around vulva  HPI:  [name] is a 87 y.o. female with history listed below presenting to the [facility] for evaluation of redness around vulva. Please see individualized problem based charting for full HPI.  Past Medical History:  Diagnosis Date   Adenocarcinoma of lung ([place])     Right upper lobe adenocarcinoma. s/p right lower lobectomy 12/15/10   ARTHRITIS, KNEE 03/24/2006   CHF (congestive heart failure) ([place])    CVA (cerebrovascular accident) ([place]) 2482    right embolic stroke, no residual deficits   Depression 12/02/2010   DIABETES MELLITUS, TYPE II 2000   Diverticulitis    GERD 12/04/2005   Hemangioma of liver 12/02/2010   HYPERLIPIDEMIA 12/04/2005   HYPERTENSION 12/04/2005   Meningioma ([place]) 07/21/2010    Review of Systems:  Negative aside from that listed in individualized problem based charting.  Physical Exam:  Vitals:   10/23/20 1432  BP: 132/63  Pulse: 95  Resp: (!) 36  Temp: 98.5 F (36.9 C)  TempSrc: Oral  SpO2: 100%  Weight: 176 lb 4.8 oz (80 kg)  Height: 5\' 4"  (1.626 m)   Physical Exam Constitutional:      Appearance: She is obese. She is not ill-appearing.  Cardiovascular:     Rate and Rhythm: Normal rate and regular rhythm.     Pulses: Normal pulses.     Heart sounds: Normal heart sounds. No murmur heard.   No friction rub. No gallop.  Pulmonary:     Effort: Pulmonary effort is normal.     Breath sounds: Normal breath sounds. No wheezing or rales.  Abdominal:     General: Bowel sounds are normal. There is no distension.     Palpations: Abdomen is soft.     Tenderness: There is no abdominal tenderness. There is no guarding or rebound.  Genitourinary:    Comments: Refer to Dr. Jimmye Norman' attestation for GU physical examination findings. Musculoskeletal:        General: No swelling. Normal range of motion.  Skin:    General: Skin is warm and dry.  Neurological:     General: No focal deficit present.     Mental Status: She is  alert and oriented to person, place, and time.  Psychiatric:        Mood and Affect: Mood normal.        Behavior: Behavior normal.     Assessment & Plan:   See Encounters Tab for problem based charting.  Patient seen with Dr.  Jimmye Norman

## 2020-10-28 ENCOUNTER — Telehealth: Payer: Self-pay | Admitting: Student

## 2020-10-28 DIAGNOSIS — E1140 Type 2 diabetes mellitus with diabetic neuropathy, unspecified: Secondary | ICD-10-CM

## 2020-10-28 DIAGNOSIS — IMO0002 Reserved for concepts with insufficient information to code with codable children: Secondary | ICD-10-CM

## 2020-10-28 LAB — CERVICOVAGINAL ANCILLARY ONLY
Bacterial Vaginitis (gardnerella): NEGATIVE
Candida Glabrata: POSITIVE — AB
Candida Vaginitis: POSITIVE — AB
Comment: NEGATIVE
Comment: NEGATIVE
Comment: NEGATIVE

## 2020-10-28 NOTE — Telephone Encounter (Signed)
Call to pharmacy as script looks okay, reran rx. He got it to go through for zero. Seondra notified and asked to call back if not zero copay.

## 2020-10-28 NOTE — Telephone Encounter (Signed)
Thanks Donna!

## 2020-10-28 NOTE — Telephone Encounter (Signed)
Refill-  Pt;s daughter went to pick up the following test strips and it was $133.00 and the patient can not afford it.  Pt's daughter was able to afford the Meter @ $14.98  Please call back:    glucose blood (ACCU-CHEK GUIDE) test strip  [id number] [place], [place] [address] (Ph: [phone])

## 2020-11-04 NOTE — Progress Notes (Signed)
Internal Medicine Clinic Attending  I saw and evaluated the patient.  I personally confirmed the key portions of the history and exam documented by Dr. Allyson Sabal and I reviewed pertinent patient test results.  The early pressure sacral wound described by phone by CG to our triage nurse earlier has improved; skin is intact and blanching.  The assessment, diagnosis, and plan were formulated together and I agree with the documentation in the resident's note.

## 2020-11-09 ENCOUNTER — Other Ambulatory Visit: Payer: Self-pay | Admitting: Student

## 2020-11-09 DIAGNOSIS — I5032 Chronic diastolic (congestive) heart failure: Secondary | ICD-10-CM

## 2020-11-09 DIAGNOSIS — E1140 Type 2 diabetes mellitus with diabetic neuropathy, unspecified: Secondary | ICD-10-CM

## 2020-11-09 DIAGNOSIS — IMO0002 Reserved for concepts with insufficient information to code with codable children: Secondary | ICD-10-CM

## 2020-11-09 DIAGNOSIS — I739 Peripheral vascular disease, unspecified: Secondary | ICD-10-CM

## 2020-11-10 ENCOUNTER — Other Ambulatory Visit: Payer: Self-pay | Admitting: Student

## 2020-11-10 MED ORDER — FLUCONAZOLE 150 MG PO TABS: 150.0000 mg | ORAL_TABLET | Freq: Every day | ORAL | 0 refills | Status: DC

## 2020-11-10 NOTE — Progress Notes (Signed)
Spoke with patient's daughter about results showing Candida glabrata. Daughter states that patient has not been complaining about symptoms anymore. Discussed treating fungal infection with fluconazole 150mg  daily for 3 doses with 72 hours in between each dose. Daughter agrees and will contact clinic should symptoms not improve after treatment.

## 2020-11-13 ENCOUNTER — Other Ambulatory Visit: Payer: Self-pay

## 2020-11-13 DIAGNOSIS — I739 Peripheral vascular disease, unspecified: Secondary | ICD-10-CM

## 2020-11-13 MED ORDER — ACETAMINOPHEN-CODEINE 300-30 MG PO TABS: ORAL_TABLET | ORAL | 0 refills | Status: DC

## 2020-11-13 NOTE — Telephone Encounter (Signed)
Received a fax from [place] which states they cannot run the Acetaminophen-Codeine RX through with Dr. Clinton Sawyer DEA number.  [facility] spoke with pharmacist, Ronalee Belts who tried running Dr. Clinton Sawyer number through again and he states it will not go through and he needs another physician to send in the RX.  Pharmacist states the [place] number went through on 8/26 and pt picked up RX, this is the refill x 1 which was ordered.  Will send to attending pool for consideration.   Thank you SChaplin, RN,BSN

## 2020-11-18 ENCOUNTER — Other Ambulatory Visit: Payer: Self-pay | Admitting: Student

## 2020-11-26 ENCOUNTER — Encounter: Payer: Self-pay | Admitting: *Deleted

## 2020-11-26 NOTE — Progress Notes (Signed)

## 2020-11-27 NOTE — Progress Notes (Signed)
Things That May Be Affecting Your Health:  Alcohol  Hearing loss  Pain   X Depression  Home Safety  Sexual Health  X Diabetes X Lack of physical activity  Stress  X Difficulty with daily activities  Loneliness  Tiredness   Drug use X Medicines  Tobacco use   Falls  Motor Vehicle Safety X Weight   Food choices  Oral Health  Other    YOUR PERSONALIZED HEALTH PLAN : 1. Schedule your next subsequent Medicare Wellness visit in one year 2. Attend all of your regular appointments to address your medical issues 3. Complete the preventative screenings and services   Annual Wellness Visit   Medicare Covered Preventative Screenings and [place] Men and Women Who How Often Need? Date of Last Service Action  Abdominal Aortic Aneurysm Adults with AAA risk factors Once      Alcohol Misuse and Counseling All Adults Screening once a year if no alcohol misuse. Counseling up to 4 face to face sessions.     Bone Density Measurement  Adults at risk for osteoporosis Once every 2 yrs      Lipid Panel Z13.6 All adults without CV disease Once every 5 yrs       Colorectal Cancer  Stool sample or Colonoscopy All adults 61 and older  Once every year Every 10 years        Depression All Adults Once a year Yes Today   Diabetes Screening Blood glucose, post glucose load, or GTT Z13.1 All adults at risk Pre-diabetics Once per year Twice per year      Diabetes  Self-Management Training All adults Diabetics 10 hrs first year; 2 hours subsequent years. Requires Copay Yes    Glaucoma Diabetics Family history of glaucoma African Americans 79 yrs + Hispanic Americans 43 yrs + Annually - requires coppay      Hepatitis C Z72.89 or F19.20 High Risk for HCV Born between 1945 and 1965 Annually Once      HIV Z11.4 All adults based on risk Annually btw ages 65 & 47 regardless of risk Annually > 65 yrs if at increased risk      Lung Cancer Screening Asymptomatic adults aged 49-77  with 30 pack yr history and current smoker OR quit within the last 15 yrs Annually Must have counseling and shared decision making documentation before first screen      Medical Nutrition Therapy Adults with  Diabetes Renal disease Kidney transplant within past 3 yrs 3 hours first year; 2 hours subsequent years     Obesity and Counseling All adults Screening once a year Counseling if BMI 30 or higher Yes Today   Tobacco Use Counseling Adults who use tobacco  Up to 8 visits in one year     Vaccines Z23 Hepatitis B Influenza  Pneumonia  Adults  Once Once every flu season Two different vaccines separated by one year Yes    Next Annual Wellness Visit People with Medicare Every year  Today     Services & Screenings Women Who How Often Need  Date of Last Service Action  Mammogram  Z12.31 Women over 33 One baseline ages 58-39. Annually ager 40 yrs+      Pap tests All women Annually if high risk. Every 2 yrs for normal risk women      Screening for cervical cancer with  Pap (Z01.419 nl or Z01.411abnl) & HPV Z11.51 Women aged 29 to 46 Once every 5 yrs  Screening pelvic and breast exams All women Annually if high risk. Every 2 yrs for normal risk women     Sexually Transmitted Diseases Chlamydia Gonorrhea Syphilis All at risk adults Annually for non pregnant females at increased risk         [place] Men Who How Ofter Need  Date of Last Service Action  Prostate Cancer - DRE & PSA Men over 50 Annually.  DRE might require a copay.        Sexually Transmitted Diseases Syphilis All at risk adults Annually for men at increased risk      Health Maintenance List Health Maintenance  Topic Date Due   COVID-19 Vaccine (1) Never done   Zoster Vaccines- Shingrix (1 of 2) Never done   OPHTHALMOLOGY EXAM  06/19/2020   INFLUENZA VACCINE  09/16/2020   HEMOGLOBIN A1C  11/06/2020   FOOT EXAM  04/15/2021   TETANUS/TDAP  04/10/2029   DEXA SCAN  Completed   HPV VACCINES   Aged Out   LIPID PANEL  Discontinued

## 2020-12-17 ENCOUNTER — Other Ambulatory Visit: Payer: Self-pay | Admitting: Student

## 2020-12-17 DIAGNOSIS — I739 Peripheral vascular disease, unspecified: Secondary | ICD-10-CM

## 2020-12-20 ENCOUNTER — Other Ambulatory Visit: Payer: Self-pay

## 2020-12-20 DIAGNOSIS — E118 Type 2 diabetes mellitus with unspecified complications: Secondary | ICD-10-CM

## 2020-12-20 DIAGNOSIS — I739 Peripheral vascular disease, unspecified: Secondary | ICD-10-CM

## 2020-12-20 NOTE — Telephone Encounter (Signed)
Received TC from patient's daughter who states they are still waiting on RX for tylenol #3 and pen needles (1st request received 11/1).  She also states the pharmacist told her they needed a new RX for losartan and Cilostazol, which she should have refills for.   TC to pharmacy, spoke w/ Ronalee Belts, he states they have a refill for both Losartan and Cilostazol and they are waiting to be picked up.  Also states Accucheck guide device kit w/ supplies picked up 9/7 and they just need a RX for more pen needles.  Will forward to yellow team to please send pain med and pen needles ( see note on 11/1). Thank you, SChaplin, RN,BSN

## 2020-12-23 ENCOUNTER — Other Ambulatory Visit: Payer: Self-pay

## 2020-12-23 DIAGNOSIS — I739 Peripheral vascular disease, unspecified: Secondary | ICD-10-CM

## 2020-12-23 MED ORDER — ACETAMINOPHEN-CODEINE 300-30 MG PO TABS: ORAL_TABLET | ORAL | 0 refills | Status: DC

## 2020-12-23 MED ORDER — INSULIN PEN NEEDLE 32G X 4 MM MISC: 3 refills | Status: AC

## 2020-12-23 NOTE — Telephone Encounter (Signed)
Acetaminophen-Codeine 300-30 MG tablet, refill request @[id number] - [place], [place] [address]

## 2020-12-24 MED ORDER — ACETAMINOPHEN-CODEINE 300-30 MG PO TABS: ORAL_TABLET | ORAL | 0 refills | Status: DC

## 2020-12-24 NOTE — Telephone Encounter (Signed)
Daughter calling regarding patient's pain medication.  PCP sent this yesterday in print mode and a message was sent to PCP to please resend RX and change mode to normal. Patient has been trying to get this RX since 11/1  Will resend to PCP, as well as attending pool to please assist. Thank you, SChaplin, RN,BSN

## 2021-01-01 MED ORDER — ACETAMINOPHEN-CODEINE 300-30 MG PO TABS: ORAL_TABLET | ORAL | 0 refills | Status: DC

## 2021-01-02 ENCOUNTER — Encounter: Payer: Self-pay | Admitting: Student

## 2021-02-05 ENCOUNTER — Telehealth: Payer: Self-pay

## 2021-02-05 DIAGNOSIS — I739 Peripheral vascular disease, unspecified: Secondary | ICD-10-CM

## 2021-02-08 ENCOUNTER — Telehealth: Payer: Self-pay | Admitting: Student

## 2021-02-08 NOTE — Telephone Encounter (Signed)
Called patient's number after receiving page. Spoke to daughter, [name], who informed me that patient had barbecue sandwich with [place] pete and some coleslaw in the morning then reported a rash to her later in the evening. Per daughter, patient has itchy rash that looks like hives on abdomen and back. Patient is stable and has no other complaints. No shortness of breath, no dysphagia, chest pain, lip/throat swelling or dizziness. Patient asked daughter to get her some Benadryl however daughter wants to make sure this is safe for patient. Advised daughter to avoid Benadryl due to patient's age (Benadryl on Beer's criteria ) and monitor patient overnight. If rash does not improve, they can try over-the-counter Allegra or Claritin for hives.  Advised to take patient to the ED if patient develops shortness of breath, throat/eye swelling or dizziness. Advised to call clinic for appt on Tuesday if rash does not resolve by then.

## 2021-02-15 ENCOUNTER — Encounter: Payer: Self-pay | Admitting: Internal Medicine

## 2021-02-15 NOTE — Assessment & Plan Note (Addendum)
Current medications: prozac 10mg  daily PHQ-9 score is 3. Continue current management.

## 2021-02-15 NOTE — Assessment & Plan Note (Addendum)
Current medications: coreg 6.25mg  BID, losartan 25mg  daily, jardiance 10mg  daily Euvolemic on exam. Weights stable. Continue current management.

## 2021-02-15 NOTE — Assessment & Plan Note (Signed)
>>  ASSESSMENT AND PLAN FOR CHRONIC DIASTOLIC CONGESTIVE HEART FAILURE (HCC) WRITTEN ON 02/19/2021  1:03 PM BY CHRISTIAN, RYLEE, MD  Current medications: coreg  6.25mg  BID, losartan  25mg  daily, jardiance  10mg  daily Euvolemic on exam. Weights stable. Continue current management.

## 2021-02-15 NOTE — Progress Notes (Signed)
Office Visit   Patient ID: [name], female    DOB: [birth year], 87 y.o.   MRN: [id number]   PCP: [name] Axe, MD   Subjective:  CC: Medication Refill (Tylenol #3/), Request for Personal Care Services, Lipoma (Left lower abd "getting bigger" ), and Diabetes (Follow up/)   [name] is a 87 y.o. year old female who presents for the above medical condition(s).   LOV was in September with her PCP, Dr. Allyson Sabal. A1C was 7.2. She was referred to ophthalmology for retinopathy screening.   Objective:   BP (!) 140/56 (BP Location: Right Arm, Cuff Size: Small)    Pulse 83    Wt 192 lb 4.8 oz (87.2 kg)    SpO2 100%    BMI 33.01 kg/m   General: chronically ill appearing female sitting in wheelchair Cardiac: RRR, no LE edema Pulm: lungs clear throughout MSK: large lipoma located on the left hip/back approximately 20x12cm. Additional lipomas located on the anterior lower arms.   Assessment & Plan:   Problem List Items Addressed This Visit       Cardiovascular and Mediastinum   Hypertension associated with diabetes ([place])    Blood pressure is appropriate for age in the office today. Denies adverse medication effects. BMP stable. Continue current management.      Relevant Medications   aspirin EC 81 MG tablet   carvedilol (COREG) 6.25 MG tablet   rosuvastatin (CRESTOR) 20 MG tablet   empagliflozin (JARDIANCE) 10 MG TABS tablet   losartan (COZAAR) 25 MG tablet   Insulin Glargine-Lixisenatide (SOLIQUA) 100-33 UNT-MCG/ML SOPN   Chronic diastolic congestive heart failure (HCC)    Current medications: coreg 6.25mg  BID, losartan 25mg  daily, jardiance 10mg  daily Euvolemic on exam. Weights stable. Continue current management.       Relevant Medications   aspirin EC 81 MG tablet   carvedilol (COREG) 6.25 MG tablet   rosuvastatin (CRESTOR) 20 MG tablet   empagliflozin (JARDIANCE) 10 MG TABS tablet   losartan (COZAAR) 25 MG tablet   PAD (peripheral artery disease) (HCC)     Current medications: cilostazol, crestor 20mg , aspirin 81mg  Symptoms stable. Has declined surgical intervention. Continue current management.       Relevant Medications   aspirin EC 81 MG tablet   Acetaminophen-Codeine 300-30 MG tablet   carvedilol (COREG) 6.25 MG tablet   cilostazol (PLETAL) 100 MG tablet   rosuvastatin (CRESTOR) 20 MG tablet   losartan (COZAAR) 25 MG tablet     Digestive   GERD    Current medications: prilosec 20mg  daily        Endocrine   Type 2 diabetes mellitus ([place]) - Primary    Current medications: Soliqua 100-33 30U daily, jardiance 10mg  daily, crestor 20mg  daily A1C is 7.2. denies adverse medication reactions or hypoglycemia.  Continue current management.        Relevant Medications   aspirin EC 81 MG tablet   carvedilol (COREG) 6.25 MG tablet   rosuvastatin (CRESTOR) 20 MG tablet   empagliflozin (JARDIANCE) 10 MG TABS tablet   losartan (COZAAR) 25 MG tablet   Insulin Glargine-Lixisenatide (SOLIQUA) 100-33 UNT-MCG/ML SOPN   Other Relevant Orders   POC Hbg A1C (Completed)     Nervous and Auditory   Seizure disorder (HCC) (Chronic)    No seizures in the past 12 months. Denies adverse medication effects. -Continue keppra 750mg  BID      Relevant Medications   levETIRAcetam (KEPPRA) 750 MG tablet     Other  History of CVA (cerebrovascular accident) (Chronic)    Continue asprin 81mg  and crestor 20mg   Pt's daughter requests PCS services. Message sent to Lela to start the process.        Relevant Medications   aspirin EC 81 MG tablet   Depression (Chronic)    Current medications: prozac 10mg  daily PHQ-9 score is 3. Continue current management.       Relevant Medications   FLUoxetine (PROZAC) 10 MG capsule   Lipoma    Pt noted to have multiple lipomas. The largest one is on her left hip/buttock. Additional ones are located on her arms. Discussed diagnosis with patient's daughter.        Return in about 3 months (around 05/19/2021) for  Follow up of chronic medical conditions with Dr. Allyson Sabal.   Pt discussed with Dr. Marty Heck, MD Internal Medicine Resident PGY-3 [facility] 02/19/2021 1:13 PM

## 2021-02-15 NOTE — Assessment & Plan Note (Addendum)
Current medications: Soliqua 100-33 30U daily, jardiance 10mg  daily, crestor 20mg  daily A1C is 7.2. denies adverse medication reactions or hypoglycemia.  Continue current management.

## 2021-02-15 NOTE — Assessment & Plan Note (Addendum)
No seizures in the past 12 months. Denies adverse medication effects. -Continue keppra 750mg  BID

## 2021-02-15 NOTE — Assessment & Plan Note (Addendum)
Current medications: cilostazol, crestor 20mg , aspirin 81mg  Symptoms stable. Has declined surgical intervention. Continue current management.

## 2021-02-15 NOTE — Assessment & Plan Note (Addendum)
Continue asprin 81mg  and crestor 20mg   Pt's daughter requests PCS services. Message sent to Lela to start the process.

## 2021-02-15 NOTE — Assessment & Plan Note (Signed)
Current medications: prilosec 20mg  daily

## 2021-02-18 ENCOUNTER — Ambulatory Visit (INDEPENDENT_AMBULATORY_CARE_PROVIDER_SITE_OTHER): Payer: Medicare Other | Admitting: Internal Medicine

## 2021-02-18 VITALS — BP 140/56 | HR 83 | Wt 192.3 lb

## 2021-02-18 DIAGNOSIS — Z794 Long term (current) use of insulin: Secondary | ICD-10-CM | POA: Diagnosis not present

## 2021-02-18 DIAGNOSIS — D171 Benign lipomatous neoplasm of skin and subcutaneous tissue of trunk: Secondary | ICD-10-CM | POA: Diagnosis not present

## 2021-02-18 DIAGNOSIS — G40909 Epilepsy, unspecified, not intractable, without status epilepticus: Secondary | ICD-10-CM

## 2021-02-18 DIAGNOSIS — I110 Hypertensive heart disease with heart failure: Secondary | ICD-10-CM | POA: Diagnosis not present

## 2021-02-18 DIAGNOSIS — I152 Hypertension secondary to endocrine disorders: Secondary | ICD-10-CM

## 2021-02-18 DIAGNOSIS — K219 Gastro-esophageal reflux disease without esophagitis: Secondary | ICD-10-CM

## 2021-02-18 DIAGNOSIS — E1122 Type 2 diabetes mellitus with diabetic chronic kidney disease: Secondary | ICD-10-CM

## 2021-02-18 DIAGNOSIS — I739 Peripheral vascular disease, unspecified: Secondary | ICD-10-CM

## 2021-02-18 DIAGNOSIS — I5032 Chronic diastolic (congestive) heart failure: Secondary | ICD-10-CM

## 2021-02-18 DIAGNOSIS — E1159 Type 2 diabetes mellitus with other circulatory complications: Secondary | ICD-10-CM | POA: Diagnosis not present

## 2021-02-18 DIAGNOSIS — E1151 Type 2 diabetes mellitus with diabetic peripheral angiopathy without gangrene: Secondary | ICD-10-CM | POA: Diagnosis not present

## 2021-02-18 DIAGNOSIS — I10 Essential (primary) hypertension: Secondary | ICD-10-CM

## 2021-02-18 DIAGNOSIS — Z8673 Personal history of transient ischemic attack (TIA), and cerebral infarction without residual deficits: Secondary | ICD-10-CM

## 2021-02-18 DIAGNOSIS — F3342 Major depressive disorder, recurrent, in full remission: Secondary | ICD-10-CM

## 2021-02-18 LAB — POCT GLYCOSYLATED HEMOGLOBIN (HGB A1C): Hemoglobin A1C: 7.2 % — AB (ref 4.0–5.6)

## 2021-02-18 LAB — GLUCOSE, CAPILLARY: Glucose-Capillary: 239 mg/dL — ABNORMAL HIGH (ref 70–99)

## 2021-02-18 MED ORDER — SOLIQUA 100-33 UNT-MCG/ML ~~LOC~~ SOPN: 30.0000 [IU] | PEN_INJECTOR | Freq: Every day | SUBCUTANEOUS | 11 refills | Status: DC

## 2021-02-18 MED ORDER — EMPAGLIFLOZIN 10 MG PO TABS: 10.0000 mg | ORAL_TABLET | Freq: Every day | ORAL | 1 refills | Status: DC

## 2021-02-18 MED ORDER — LEVETIRACETAM 750 MG PO TABS: 750.0000 mg | ORAL_TABLET | Freq: Two times a day (BID) | ORAL | 3 refills | Status: DC

## 2021-02-18 MED ORDER — FLUOXETINE HCL 10 MG PO CAPS: ORAL_CAPSULE | ORAL | 1 refills | Status: DC

## 2021-02-18 MED ORDER — ROSUVASTATIN CALCIUM 20 MG PO TABS: 20.0000 mg | ORAL_TABLET | Freq: Every day | ORAL | 1 refills | Status: DC

## 2021-02-18 MED ORDER — CILOSTAZOL 100 MG PO TABS: 100.0000 mg | ORAL_TABLET | Freq: Two times a day (BID) | ORAL | 1 refills | Status: DC

## 2021-02-18 MED ORDER — ASPIRIN EC 81 MG PO TBEC: 81.0000 mg | DELAYED_RELEASE_TABLET | Freq: Every day | ORAL | 2 refills | Status: AC

## 2021-02-18 MED ORDER — CARVEDILOL 6.25 MG PO TABS: 6.2500 mg | ORAL_TABLET | Freq: Two times a day (BID) | ORAL | 3 refills | Status: DC

## 2021-02-18 MED ORDER — LOSARTAN POTASSIUM 25 MG PO TABS: 25.0000 mg | ORAL_TABLET | Freq: Every day | ORAL | 1 refills | Status: DC

## 2021-02-18 MED ORDER — ACETAMINOPHEN-CODEINE 300-30 MG PO TABS: ORAL_TABLET | ORAL | 0 refills | Status: DC

## 2021-02-18 NOTE — Patient Instructions (Signed)
Ms.Jacqulyne Mercie Eon, it was a pleasure seeing you today!  Today we discussed:  Diabetes: Your A1C is 7.1. Continue your current medications.  Hypertension: Continue losartan and coreg  Personal care services: Our office will work on the paperwork to get this restarted.  I have ordered labs at today's visit. If there are any major abnormalities, I will call you to discuss them. If you do not hear from me, assume no major changes.  Follow-up: 3 months   Please make sure to arrive 15 minutes prior to your next appointment. If you arrive late, you may be asked to reschedule.   We look forward to seeing you next time. Please call our clinic at [phone] if you have any questions or concerns. The best time to call is Monday-Friday from 9am-4pm, but there is someone available 24/7. If after hours or the weekend, call the main hospital number and ask for the Internal Medicine Resident On-Call. If you need medication refills, please notify your pharmacy one week in advance and they will send [place] a request.  Thank you for letting us take part in your care. Wishing you the best!

## 2021-02-19 ENCOUNTER — Encounter: Payer: Self-pay | Admitting: Internal Medicine

## 2021-02-19 DIAGNOSIS — D179 Benign lipomatous neoplasm, unspecified: Secondary | ICD-10-CM | POA: Insufficient documentation

## 2021-02-19 LAB — BASIC METABOLIC PANEL
BUN/Creatinine Ratio: 16 (ref 12–28)
BUN: 15 mg/dL (ref 8–27)
CO2: 24 mmol/L (ref 20–29)
Calcium: 8.6 mg/dL — ABNORMAL LOW (ref 8.7–10.3)
Chloride: 106 mmol/L (ref 96–106)
Creatinine, Ser: 0.92 mg/dL (ref 0.57–1.00)
Glucose: 214 mg/dL — ABNORMAL HIGH (ref 70–99)
Potassium: 4.5 mmol/L (ref 3.5–5.2)
Sodium: 145 mmol/L — ABNORMAL HIGH (ref 134–144)
eGFR: 61 mL/min/{1.73_m2} (ref >59)

## 2021-02-19 NOTE — Assessment & Plan Note (Signed)
Pt noted to have multiple lipomas. The largest one is on her left hip/buttock. Additional ones are located on her arms. Discussed diagnosis with patient's daughter.

## 2021-02-19 NOTE — Assessment & Plan Note (Addendum)
Blood pressure is appropriate for age in the office today. Denies adverse medication effects. BMP stable. Continue current management.

## 2021-02-20 ENCOUNTER — Telehealth: Payer: Self-pay | Admitting: *Deleted

## 2021-02-20 NOTE — Telephone Encounter (Signed)
PCS FORM GIVEN TO DR CHRISTIAN TO COMPLETE 02-19-2021. SHE HAD SEEN PATIENT IN OFFICE. WAITING FOR PAPER WORK TO BE RETURNED.

## 2021-02-24 ENCOUNTER — Telehealth: Payer: Self-pay | Admitting: *Deleted

## 2021-02-24 NOTE — Telephone Encounter (Signed)
PCS form faxed to [employer] / [place] (ph [phone] / fax [phone]/ transmission 12:56 /12 pages). Patient was contact and informed that her PCS request had been faxed, office will contact them to set up appointment for nurse assessment appointment.

## 2021-02-28 NOTE — Progress Notes (Signed)
Internal Medicine Clinic Attending  Case discussed with Dr. Christian  At the time of the visit.  We reviewed the resident's history and exam and pertinent patient test results.  I agree with the assessment, diagnosis, and plan of care documented in the resident's note.  

## 2021-03-06 ENCOUNTER — Telehealth: Payer: Self-pay | Admitting: *Deleted

## 2021-03-06 NOTE — Telephone Encounter (Signed)
CALLED PATIENT UNABLE TO LEAVE MESSAGE DUE TO PHONE FULL.  SPOKE WITH [facility] AND PATIENT NEEDS TO CONTACT OFFICE TO SET UP ASSESSMENT APPOINTMENT. PATIENT NEEDS TO CALL [phone].

## 2021-03-31 ENCOUNTER — Telehealth: Payer: Self-pay | Admitting: *Deleted

## 2021-03-31 NOTE — Telephone Encounter (Signed)
Called [name] (spoke with daughter) regarding her PCS request.  Daughter spoke with [name], daughter states that she told them that they are trying to move and she  will stay in touch with [name] and will let them know when [name] has moved to they start her services.

## 2021-05-07 ENCOUNTER — Encounter: Payer: Medicare Other | Admitting: Internal Medicine

## 2021-05-12 ENCOUNTER — Encounter: Payer: Medicare Other | Admitting: Internal Medicine

## 2021-05-19 ENCOUNTER — Ambulatory Visit (INDEPENDENT_AMBULATORY_CARE_PROVIDER_SITE_OTHER): Payer: Medicare Other | Admitting: Internal Medicine

## 2021-05-19 VITALS — BP 139/60 | HR 86 | Temp 97.8°F | Wt 193.8 lb

## 2021-05-19 DIAGNOSIS — M12819 Other specific arthropathies, not elsewhere classified, unspecified shoulder: Secondary | ICD-10-CM

## 2021-05-19 DIAGNOSIS — M1711 Unilateral primary osteoarthritis, right knee: Secondary | ICD-10-CM | POA: Diagnosis not present

## 2021-05-19 DIAGNOSIS — M12812 Other specific arthropathies, not elsewhere classified, left shoulder: Secondary | ICD-10-CM

## 2021-05-19 DIAGNOSIS — I739 Peripheral vascular disease, unspecified: Secondary | ICD-10-CM

## 2021-05-19 DIAGNOSIS — E1151 Type 2 diabetes mellitus with diabetic peripheral angiopathy without gangrene: Secondary | ICD-10-CM

## 2021-05-19 DIAGNOSIS — Z794 Long term (current) use of insulin: Secondary | ICD-10-CM

## 2021-05-19 HISTORY — DX: Other specific arthropathies, not elsewhere classified, unspecified shoulder: M12.819

## 2021-05-19 LAB — POCT GLYCOSYLATED HEMOGLOBIN (HGB A1C): Hemoglobin A1C: 8.1 % — AB (ref 4.0–5.6)

## 2021-05-19 LAB — GLUCOSE, CAPILLARY: Glucose-Capillary: 184 mg/dL — ABNORMAL HIGH (ref 70–99)

## 2021-05-19 MED ORDER — LIRAGLUTIDE 18 MG/3ML ~~LOC~~ SOPN: PEN_INJECTOR | SUBCUTANEOUS | 3 refills | Status: DC

## 2021-05-19 MED ORDER — ACETAMINOPHEN-CODEINE 300-30 MG PO TABS: ORAL_TABLET | ORAL | 0 refills | Status: DC

## 2021-05-19 MED ORDER — OZEMPIC (0.25 OR 0.5 MG/DOSE) 2 MG/1.5ML ~~LOC~~ SOPN: PEN_INJECTOR | SUBCUTANEOUS | 0 refills | Status: AC

## 2021-05-19 NOTE — Patient Instructions (Addendum)
[facility] ? ?It was a pleasure seeing you in the clinic today.  ? ?We talked about your shoulder pain and your diabetes. ? ?Diabetes- I filled out the form to help get you assistance at home. In the mean time I want you to STOP taking your insulin and start taking victoza. It is a once weakly injection. ?2. Shoulder pain- continue using heating pads, voltaren gel, and tylenol for your pain. ? ?Please call our clinic at [phone] if you have any questions or concerns. The best time to call is Monday-Friday from 9am-4pm, but there is someone available 24/7 at the same number. If you need medication refills, please notify your pharmacy one week in advance and they will send [place] a request. ?  ?Thank you for letting us take part in your care. We look forward to seeing you next time! ? ?

## 2021-05-19 NOTE — Assessment & Plan Note (Signed)
Patient has occasional sharp pain with left arm. She has been trying to use heating packs, voltaren, and tylenol. Range of motion is full on exam both active and passive. ?A/P ?- continue conservative management with heat, stretching, and tylenol ?

## 2021-05-19 NOTE — Assessment & Plan Note (Signed)
Patient says that she has been taking tylenol 3 for years. Recommend that this be readdressed with PCP and alternative treatment methods for pain be discussed as patient reports having a fall about a month ago. ?- 1 time refill on tylenol 3, f/u with PCP ?

## 2021-05-19 NOTE — Progress Notes (Signed)
? ?  CC: shoulder pain ? ?HPI: ? ?[name] is a 88 y.o. PMH noted below, who presents to the [facility] with complaints of shoulder pain. To see the management of his acute and chronic conditions, please refer to the A&P note under the encounters tab.  ? ?Past Medical History:  ?Diagnosis Date  ? Adenocarcinoma of lung ([name])   ?  Right upper lobe adenocarcinoma. s/p right lower lobectomy 12/15/10  ? ARTHRITIS, KNEE 03/24/2006  ? CHF (congestive heart failure) ([place])   ? CVA (cerebrovascular accident) [facility]) 2006   ? right embolic stroke, no residual deficits  ? Depression 12/02/2010  ? DIABETES MELLITUS, TYPE II 2000  ? Diverticulitis   ? GERD 12/04/2005  ? Hemangioma of liver 12/02/2010  ? History of meningioma of the brain 07/21/2010  ? Per MRI 10/2010 stable.   ? History of pulmonary embolism 04/06/2013  ? HYPERLIPIDEMIA 12/04/2005  ? HYPERTENSION 12/04/2005  ? Meningioma ([place]) 07/21/2010  ? Right upper lobe, Adenocarcinoma of lung   ? 11/09 - BAL reactive findings no malignancy. 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 Last him him 03/2008 when she fell out of care.  Last imagining 2/10 - stable to minimal increase in RUL mass. CT chest 11/12/10 increasing in size PET scan 11/18/10 Interval increase in size and metabolic activity   ? ?Review of Systems:  positive for shoulder pain, neck pain ? ?Physical Exam: ?Gen: elderly woman sitting in wheelchair in NAD ?HEENT: normocephalic atraumatic, MMM, no neck tenderness, hearing impairment ?CV: RRR, no m/r/g   ?Resp: CTAB, normal WOB  ?GI: soft, nontender ?MSK: full active and passive range of motion of the BUE, tenderness to palpation of the glenohumeral joint on the left ?Skin:warm and dry ?Neuro:alert answering questions appropriately ?Psych: normal affect ? ? ?Assessment & Plan:  ? ?See Encounters Tab for problem based charting. ? ?Patient discussed with Dr. Heber Sunrise  ? ?

## 2021-05-19 NOTE — Assessment & Plan Note (Addendum)
A1c 8.1. Patient says that she has not taken her insulin for about two weeks. Prior to that she tried to take it at least once a day. Patient is also in the process of getting an aide to help at home. Suspect patient is unable to manage medications by herself at this time. Current worsening of A1c is likely related to medication non-compliance rather than insufficient dosing.  ? ?A/P ?-discontinue insulin ?- start ozempic 0.25 for 4 weeks followed by 0.5 ?- f/u in 4 weeks to make sure she is tolerating it well ?- continue jardiance 10 ?

## 2021-05-20 ENCOUNTER — Telehealth: Payer: Self-pay | Admitting: *Deleted

## 2021-05-20 NOTE — Progress Notes (Signed)
Internal Medicine Clinic Attending ° °Case discussed with Dr. DeMaio  At the time of the visit.  We reviewed the resident’s history and exam and pertinent patient test results.  I agree with the assessment, diagnosis, and plan of care documented in the resident’s note. ° ° °

## 2021-05-20 NOTE — Telephone Encounter (Signed)
CALLED PATIENT AND LEFT VOICE MESSAGE REGARDING PCS FORM. WAITING FOR CALL BACK . [phone] ?

## 2021-05-22 ENCOUNTER — Telehealth: Payer: Self-pay | Admitting: *Deleted

## 2021-05-22 NOTE — Telephone Encounter (Signed)
Spoke with daughter, [name] to let her know that PCS forms been faxed to [facility] for review  and scheduling , phone- [phone]  fax [phone]. Office to contact patient to set up appointment. ?

## 2021-06-03 ENCOUNTER — Other Ambulatory Visit: Payer: Self-pay

## 2021-06-03 ENCOUNTER — Emergency Department (HOSPITAL_COMMUNITY)
Admission: EM | Admit: 2021-06-03 | Discharge: 2021-06-03 | Disposition: A | Discharge status: Home / Self Care | Payer: Medicare Other | Attending: Emergency Medicine | Admitting: Emergency Medicine

## 2021-06-03 ENCOUNTER — Emergency Department (HOSPITAL_COMMUNITY): Payer: Medicare Other

## 2021-06-03 DIAGNOSIS — Y92020 Kitchen in mobile home as the place of occurrence of the external cause: Secondary | ICD-10-CM | POA: Insufficient documentation

## 2021-06-03 DIAGNOSIS — W228XXA Striking against or struck by other objects, initial encounter: Secondary | ICD-10-CM | POA: Diagnosis not present

## 2021-06-03 DIAGNOSIS — G319 Degenerative disease of nervous system, unspecified: Secondary | ICD-10-CM | POA: Diagnosis not present

## 2021-06-03 DIAGNOSIS — Z7902 Long term (current) use of antithrombotics/antiplatelets: Secondary | ICD-10-CM | POA: Diagnosis not present

## 2021-06-03 DIAGNOSIS — E119 Type 2 diabetes mellitus without complications: Secondary | ICD-10-CM | POA: Insufficient documentation

## 2021-06-03 DIAGNOSIS — R739 Hyperglycemia, unspecified: Secondary | ICD-10-CM | POA: Diagnosis not present

## 2021-06-03 DIAGNOSIS — M47812 Spondylosis without myelopathy or radiculopathy, cervical region: Secondary | ICD-10-CM | POA: Diagnosis not present

## 2021-06-03 DIAGNOSIS — I509 Heart failure, unspecified: Secondary | ICD-10-CM | POA: Diagnosis not present

## 2021-06-03 DIAGNOSIS — I499 Cardiac arrhythmia, unspecified: Secondary | ICD-10-CM | POA: Diagnosis not present

## 2021-06-03 DIAGNOSIS — I110 Hypertensive heart disease with heart failure: Secondary | ICD-10-CM | POA: Insufficient documentation

## 2021-06-03 DIAGNOSIS — Z7982 Long term (current) use of aspirin: Secondary | ICD-10-CM | POA: Diagnosis not present

## 2021-06-03 DIAGNOSIS — Z743 Need for continuous supervision: Secondary | ICD-10-CM | POA: Diagnosis not present

## 2021-06-03 DIAGNOSIS — Z8673 Personal history of transient ischemic attack (TIA), and cerebral infarction without residual deficits: Secondary | ICD-10-CM | POA: Insufficient documentation

## 2021-06-03 DIAGNOSIS — S0990XA Unspecified injury of head, initial encounter: Secondary | ICD-10-CM | POA: Diagnosis not present

## 2021-06-03 DIAGNOSIS — R519 Headache, unspecified: Secondary | ICD-10-CM | POA: Diagnosis not present

## 2021-06-03 DIAGNOSIS — I639 Cerebral infarction, unspecified: Secondary | ICD-10-CM | POA: Diagnosis not present

## 2021-06-03 DIAGNOSIS — R6889 Other general symptoms and signs: Secondary | ICD-10-CM | POA: Diagnosis not present

## 2021-06-03 DIAGNOSIS — Z794 Long term (current) use of insulin: Secondary | ICD-10-CM | POA: Insufficient documentation

## 2021-06-03 DIAGNOSIS — G9389 Other specified disorders of brain: Secondary | ICD-10-CM | POA: Diagnosis not present

## 2021-06-03 DIAGNOSIS — S199XXA Unspecified injury of neck, initial encounter: Secondary | ICD-10-CM | POA: Diagnosis not present

## 2021-06-03 NOTE — ED Notes (Signed)
RN reviewed discharge instructions w/ pt. Follow up reviewed, pt had no further questions 

## 2021-06-03 NOTE — ED Provider Notes (Signed)
?[place] ?Provider Note ? ? ?CSN: [id number] ?Arrival date & time: 06/03/21  1317 ? ?  ? ?History ? ?No chief complaint on file. ? ? ?[name] is a 88 y.o. female. ? ?Patient with history of diabetes, CVA, hypertension, CHF presents today with complaints of head injury.  Patient states that earlier today she was at home and was standing up attempting to get food out of a cabinet and pulling the whole cabinet off the wall and striking her in the head.  She states she remembers the entire incident and denies any loss of consciousness.  She did not fall.  She is not on anticoagulation.  She endorses mild headache without vision changes or neck pain. ? ?The history is provided by the patient. No language interpreter was used.  ? ?  ? ?Home Medications ?Prior to Admission medications   ?Medication Sig Start Date End Date Taking? Authorizing Provider  ?Accu-Chek FastClix Lancets MISC CHECK BLOOD SUGAR THREE TIMES A DAY. DIAG CODE E11.9. INSULIN DEPENDENT 10/23/20   Virl Axe, MD  ?Acetaminophen-Codeine 300-30 MG tablet TAKE 1 TABLET BY MOUTH DAILY AS NEEDED FOR MODERATE PAIN OR SEVERE PAIN. 05/19/21   Scarlett Presto, MD  ?aspirin EC 81 MG tablet Take 1 tablet (81 mg total) by mouth daily. 02/18/21   Mitzi Hansen, MD  ?barrier cream (NON-SPECIFIED) CREA Apply 1 application topically 2 (two) times daily as needed (place on vulva twice daily as needed for redness and swelling). 10/23/20   Virl Axe, MD  ?Blood Glucose Monitoring Suppl (ACCU-CHEK GUIDE) w/Device KIT 1 each by Does not apply route 3 (three) times daily. 10/23/20   Virl Axe, MD  ?carvedilol (COREG) 6.25 MG tablet Take 1 tablet (6.25 mg total) by mouth 2 (two) times daily with a meal. 02/18/21   Mitzi Hansen, MD  ?cilostazol (PLETAL) 100 MG tablet Take 1 tablet (100 mg total) by mouth 2 (two) times daily. 02/18/21   Mitzi Hansen, MD  ?empagliflozin (JARDIANCE) 10 MG TABS tablet Take 1 tablet (10 mg  total) by mouth daily. 02/18/21   Mitzi Hansen, MD  ?FLUoxetine (PROZAC) 10 MG capsule TAKE 1 CAPSULE BY MOUTH EVERY DAY 02/18/21   Mitzi Hansen, MD  ?glucose blood (ACCU-CHEK GUIDE) test strip Check blood sugar 3 times per day 10/23/20   Virl Axe, MD  ?Incontinence Supplies MISC For use to address bowel and bladder incontinence. 07/04/15   Liberty Handy, MD  ?Insulin Pen Needle (BD PEN NEEDLE NANO U/F) 32G X 4 MM MISC USE TO ADMINISTER SOLIQUA ONE TIME A DAY 01/01/21   Virl Axe, MD  ?Insulin Pen Needle 32G X 4 MM MISC Use to administer soliqua one time a day 12/23/20   Virl Axe, MD  ?levETIRAcetam (KEPPRA) 750 MG tablet Take 1 tablet (750 mg total) by mouth 2 (two) times daily. 02/18/21   Mitzi Hansen, MD  ?losartan (COZAAR) 25 MG tablet Take 1 tablet (25 mg total) by mouth daily. 02/18/21   Mitzi Hansen, MD  ?rosuvastatin (CRESTOR) 20 MG tablet Take 1 tablet (20 mg total) by mouth daily. 02/18/21   Mitzi Hansen, MD  ?Semaglutide,0.25 or 0.5MG/DOS, (OZEMPIC, 0.25 OR 0.5 MG/DOSE,) 2 MG/1.5ML SOPN Inject 0.25 mg into the skin once a week for 28 days, THEN 0.5 mg once a week for 28 days. 05/19/21 07/14/21  Scarlett Presto, MD  ?   ? ?Allergies    ?Patient has no known allergies.   ? ?Review of Systems   ?Review  of Systems  ?Constitutional:  Negative for chills and fever.  ?Neurological:  Positive for headaches. Negative for dizziness, tremors, seizures, syncope, facial asymmetry, speech difficulty, weakness, light-headedness and numbness.  ?All other systems reviewed and are negative. ? ?Physical Exam ?Updated Vital Signs ?BP (!) 194/100   Pulse 80   Temp 98 ?F (36.7 ?C) (Oral)   Resp 18   SpO2 97%  ?Physical Exam ?Vitals and nursing note reviewed.  ?Constitutional:   ?   General: She is not in acute distress. ?   Appearance: Normal appearance. She is normal weight. She is not ill-appearing, toxic-appearing or diaphoretic.  ?HENT:  ?   Head: Normocephalic and atraumatic.  ?   Comments: Small  hematoma noted to the right anterior frontal portion of the head without significant swelling or wound. ?Cardiovascular:  ?   Rate and Rhythm: Normal rate and regular rhythm.  ?   Heart sounds: Normal heart sounds.  ?Pulmonary:  ?   Effort: Pulmonary effort is normal. No respiratory distress.  ?   Breath sounds: Normal breath sounds.  ?Musculoskeletal:     ?   General: Normal range of motion.  ?   Cervical back: Normal range of motion.  ?Skin: ?   General: Skin is warm and dry.  ?Neurological:  ?   General: No focal deficit present.  ?   Mental Status: She is alert and oriented to person, place, and time. Mental status is at baseline.  ?   Motor: No weakness.  ?Psychiatric:     ?   Mood and Affect: Mood normal.     ?   Behavior: Behavior normal.  ? ? ?ED Results / Procedures / Treatments   ?Labs ?(all labs ordered are listed, but only abnormal results are displayed) ?Labs Reviewed - No data to display ? ?EKG ?None ? ?Radiology ?CT Head Wo Contrast ? ?Result Date: 06/03/2021 ?CLINICAL DATA:  Head trauma, minor (Age >= 65y); Neck trauma (Age >= 65y) EXAM: CT HEAD WITHOUT CONTRAST CT CERVICAL SPINE WITHOUT CONTRAST TECHNIQUE: Multidetector CT imaging of the head and cervical spine was performed following the standard protocol without intravenous contrast. Multiplanar CT image reconstructions of the cervical spine were also generated. RADIATION DOSE REDUCTION: This exam was performed according to the departmental dose-optimization program which includes automated exposure control, adjustment of the mA and/or kV according to patient size and/or use of iterative reconstruction technique. COMPARISON:  CT cervical spine 10/12/2014.  CT head 10/06/2014. FINDINGS: CT HEAD FINDINGS Brain: Chronic right frontal encephalomalacia. Remote infarct in the right posterior limb of the internal capsule. Small remote infarcts in the inferior left cerebellum. No evidence of acute large vascular territory infarct, acute hemorrhage, mass  lesion, midline shift, or hydrocephalus. Ex vacuo ventricular dilation from cerebral atrophy. Vascular: Calcific intracranial atherosclerosis. No hyperdense vessel identified. Skull: No acute fracture. 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 No aggressive features. Sinuses/Orbits: Small amount of frothy secretions right frontal sinus. Otherwise, sinuses are largely clear. CT CERVICAL SPINE FINDINGS Alignment: Mild reversal of normal cervical lordosis. No substantial sagittal subluxation. Skull base and vertebrae: Vertebral body heights are maintained. No evidence of acute fracture. Multiple small discrete and chronic lytic lesions, probably a benign given long-term stability. Soft tissues and spinal canal: No prevertebral fluid or swelling. No visible canal hematoma. Disc levels:  Similar multilevel degenerative change. Upper chest: Visualized lung apices are clear. IMPRESSION: CT head: 1. No evidence of acute intracranial abnormality.  2. Remote right frontal encephalomalacia with overlying craniotomy. 3. Additional remote infarcts, chronic microvascular ischemic disease, and cerebral atrophy (ICD10-G31.9). CT cervical spine: 1. No evidence of acute fracture or traumatic malalignment. 2. Similar multilevel degenerative change. Electronically Signed   By: Margaretha Sheffield M.D.   On: 06/03/2021 14:35  ? ?CT Cervical Spine Wo Contrast ? ?Result Date: 06/03/2021 ?CLINICAL DATA:  Head trauma, minor (Age >= 65y); Neck trauma (Age >= 65y) EXAM: CT HEAD WITHOUT CONTRAST CT CERVICAL SPINE WITHOUT CONTRAST TECHNIQUE: Multidetector CT imaging of the head and cervical spine was performed following the standard protocol without intravenous contrast. Multiplanar CT image reconstructions of the cervical spine were also generated. RADIATION DOSE REDUCTION: This exam was performed according to the departmental dose-optimization program which includes  automated exposure control, adjustment of the mA and/or kV according to patient size and/or use of iterative reconstruction technique. COMPARISON:  CT cervical spine 10/12/2014.  CT head 10/06/2014. FINDINGS: CT HE

## 2021-06-03 NOTE — Discharge Instructions (Signed)
As we discussed, your work-up in the ER today was reassuring for acute abnormalities.  CT imaging of your head did not reveal any emergent concerns at this time.  Please be aware that the swelling may increase in the next 24 to 48 hours, this is normal.  You may apply ice to the area of swelling which can help. ? ?Return if development of any new or worsening symptoms. ?

## 2021-06-03 NOTE — ED Triage Notes (Signed)
Pt from home after kitchen cabinet fell off the wall onto her head. Hematoma to R forehead, A/O x 4, no LOC, denies vision changes, dizziness. No neck or back pain, although C collar in place by EMS.  ?

## 2021-06-24 ENCOUNTER — Telehealth: Payer: Self-pay | Admitting: *Deleted

## 2021-06-24 NOTE — Telephone Encounter (Signed)
Spoke with daughter her mom is getting her PCS from [facility]- [phone] spoke with Mr. Owens Shark. patient is getting at total 50 hours per month. ?

## 2021-06-25 ENCOUNTER — Other Ambulatory Visit: Payer: Self-pay | Admitting: Student

## 2021-07-01 ENCOUNTER — Other Ambulatory Visit: Payer: Self-pay | Admitting: *Deleted

## 2021-07-01 DIAGNOSIS — I739 Peripheral vascular disease, unspecified: Secondary | ICD-10-CM

## 2021-07-01 NOTE — Telephone Encounter (Signed)
Patient's daughter called in stating they have been unable to p/u Ozempic as it requires a PA. Will forward to PA Staff. ? ?Also requesting refill on T#3. ?

## 2021-07-02 MED ORDER — ACETAMINOPHEN-CODEINE 300-30 MG PO TABS: ORAL_TABLET | ORAL | 0 refills | Status: DC

## 2021-07-02 NOTE — Telephone Encounter (Signed)
Please schedule patient for a visit with me in 1 month (before next refills is due) for reevaluation before continuing Tylenol #3. Per Dr Allyson Sabal. ?

## 2021-07-31 ENCOUNTER — Encounter: Payer: Self-pay | Admitting: Student

## 2021-07-31 ENCOUNTER — Encounter: Payer: Medicare Other | Admitting: Student

## 2021-08-26 ENCOUNTER — Other Ambulatory Visit: Payer: Self-pay | Admitting: Student

## 2021-08-26 DIAGNOSIS — I739 Peripheral vascular disease, unspecified: Secondary | ICD-10-CM

## 2021-10-01 ENCOUNTER — Telehealth: Payer: Self-pay | Admitting: *Deleted

## 2021-10-01 ENCOUNTER — Encounter: Payer: Medicare Other | Admitting: Internal Medicine

## 2021-10-01 ENCOUNTER — Encounter: Payer: Self-pay | Admitting: Student

## 2021-10-01 NOTE — Telephone Encounter (Signed)
Attempted to contact pt at numbers on chart regarding missed [facility] appt No answer No message left.  Regenia Skeeter, Melony Tenpas Cassady8/16/20232:36 PM

## 2021-11-12 ENCOUNTER — Encounter: Payer: Medicare Other | Admitting: Student

## 2021-11-24 ENCOUNTER — Other Ambulatory Visit: Payer: Self-pay | Admitting: Student

## 2021-11-25 ENCOUNTER — Ambulatory Visit: Payer: Self-pay

## 2021-11-25 ENCOUNTER — Encounter: Payer: Self-pay | Admitting: Student

## 2021-11-25 ENCOUNTER — Ambulatory Visit (INDEPENDENT_AMBULATORY_CARE_PROVIDER_SITE_OTHER): Payer: Medicare Other | Admitting: Student

## 2021-11-25 VITALS — BP 129/51 | HR 80 | Temp 97.9°F | Ht 64.0 in | Wt 196.0 lb

## 2021-11-25 DIAGNOSIS — M1711 Unilateral primary osteoarthritis, right knee: Secondary | ICD-10-CM

## 2021-11-25 DIAGNOSIS — R5381 Other malaise: Secondary | ICD-10-CM | POA: Insufficient documentation

## 2021-11-25 DIAGNOSIS — Z794 Long term (current) use of insulin: Secondary | ICD-10-CM

## 2021-11-25 DIAGNOSIS — E1151 Type 2 diabetes mellitus with diabetic peripheral angiopathy without gangrene: Secondary | ICD-10-CM | POA: Diagnosis not present

## 2021-11-25 DIAGNOSIS — G40909 Epilepsy, unspecified, not intractable, without status epilepticus: Secondary | ICD-10-CM

## 2021-11-25 DIAGNOSIS — Z8673 Personal history of transient ischemic attack (TIA), and cerebral infarction without residual deficits: Secondary | ICD-10-CM | POA: Diagnosis not present

## 2021-11-25 DIAGNOSIS — E669 Obesity, unspecified: Secondary | ICD-10-CM

## 2021-11-25 DIAGNOSIS — I5032 Chronic diastolic (congestive) heart failure: Secondary | ICD-10-CM | POA: Diagnosis not present

## 2021-11-25 DIAGNOSIS — I739 Peripheral vascular disease, unspecified: Secondary | ICD-10-CM

## 2021-11-25 DIAGNOSIS — E66811 Obesity, class 1: Secondary | ICD-10-CM

## 2021-11-25 LAB — POCT GLYCOSYLATED HEMOGLOBIN (HGB A1C): Hemoglobin A1C: 7.8 % — AB (ref 4.0–5.6)

## 2021-11-25 LAB — GLUCOSE, CAPILLARY: Glucose-Capillary: 151 mg/dL — ABNORMAL HIGH (ref 70–99)

## 2021-11-25 MED ORDER — ACETAMINOPHEN-CODEINE 300-30 MG PO TABS: ORAL_TABLET | ORAL | 0 refills | Status: DC

## 2021-11-25 NOTE — Assessment & Plan Note (Signed)
Patient living with T2DM on jardiance 10mg  daily. Her last A1c was 8.1% in April 2023. She was prescribed ozempic at her last visit, but was unable to obtain this as her insurance did not cover the medication. Her A1c today is 7.8%. Given her advanced age and comorbid conditions, would favor a less stringent glycemic control goal. Moving forward, will aim for A1c < 8% as goal for her. At this time, continue jardiance alone.  Plan: -continue jardiance 10mg  daily

## 2021-11-25 NOTE — Assessment & Plan Note (Addendum)
Patient with significant physical deconditioning, peripheral arterial disease (not a surgical candidate), obesity, and significant R knee OA. Her primary caregiver is her daughter, who is present with her. States that patient has significant difficulty at home with moving from one place to another. This is becoming very functionally limiting for her. Daughter states that patient is a fall risk and thus would like evaluation for power wheelchair to allow for patient to become more functional. I do believe that patient would find benefit with a power wheelchair. Will refer her to neuro rehab for full evaluation. Also placed referral to [facility] for obtaining Meals on Wheels for patient.  Plan: -referral to neuro rehab for full evaluation for power wheelchair -referral to [facility] for Meals on Wheels

## 2021-11-25 NOTE — Assessment & Plan Note (Signed)
Patient with chronic OA of her R knee, has been managed with tylenol #3 for numerous years. She has cut down to about half a tablet BID as needed for pain management and this works relatively well for her. She has run out and is requesting a refill. She is primarily wheelchair bound, in the process of evaluation for power wheelchair, so less concerned for falls going forward.   -refilled tylenol #3

## 2021-11-25 NOTE — Assessment & Plan Note (Signed)
Living with PAD, managed with medications. She is on cilostazol, crestor, and ASA. Given that she is primarily wheelchair bound, she declined prior surgical intervention which is reasonable. She is currently asymptomatic from this standpoint.

## 2021-11-25 NOTE — Patient Instructions (Signed)
[name],  It was a pleasure seeing you in the clinic today.   I have refilled your tylenol #3. I have placed a referral to Neuro Rehab who will perform an evaluation for power wheelchair. They will be in contact with you to schedule this. I have placed a referral to [facility], who will try her best to help set up Meals on Wheels for you. Please come back in 3 months for a follow up visit.  Please call our clinic at [phone] if you have any questions or concerns. The best time to call is Monday-Friday from 9am-4pm, but there is someone available 24/7 at the same number. If you need medication refills, please notify your pharmacy one week in advance and they will send [place] a request.   Thank you for letting us take part in your care. We look forward to seeing you next time!

## 2021-11-25 NOTE — Patient Outreach (Signed)
  Care Coordination   Initial Visit Note   11/25/2021 Name: SHANNARA WINBUSH MRN: [id number] DOB: 19-Oct-1933  TRINNA KUNST is a 88 y.o. year old female who sees Virl Axe, MD for primary care. I engaged with [name] in the providers office today.  What matters to the patients health and wellness today?  Collaborated with Dr. Allyson Sabal on patients needs and difficulty with medications.  Met with patient and her daughter [name] in office today.  Patient lives with her daughter and she has been her moms caregiver until she obtained PCS services for [facility] for 2-2.5 hours per day.  [name] then started working and her her mom is left alone for several hours. [name] is interested in getting strip packs from [facility] for her mom and having her medications delivered.  She also wants her mom to get a life alert.  She looked into it and cost is an issues.  Referral to MSW to assist with financial issues.  This RNCM provided sample of chocolate glucerna for patient along with coupons.   Goals Addressed               This Visit's Progress     Obtain affordable life alert system (pt-stated)        Care Coordination Interventions: Reviewed medications with patient and discussed getting medications delivered and prepackaged Social Work referral for assistance with financial difficulty, obtaining life alert-? medicaid Assessed social determinant of health barriers         SDOH assessments and interventions completed:  Yes  SDOH Interventions Today    Flowsheet Row Most Recent Value  SDOH Interventions   Food Insecurity Interventions Intervention Not Indicated  Housing Interventions Intervention Not Indicated  Financial Strain Interventions Intervention Not Indicated  [Provided samples of Glucerna protein shakes]        Care Coordination Interventions Activated:  Yes  Care Coordination Interventions:  Yes, provided   Follow up plan: Referral made to SW and  pharmacy tech to assist with medications/delivery    Encounter Outcome:  Pt. Visit Completed

## 2021-11-25 NOTE — Assessment & Plan Note (Signed)
Living with chronic diastolic HF. She is adherent with her home coreg 6.25mg  BID, losartan 25mg  daily, and jardiance 10mg  daily. She is euvolemic on my exam today. Continue current management.

## 2021-11-25 NOTE — Assessment & Plan Note (Signed)
>>  ASSESSMENT AND PLAN FOR CHRONIC DIASTOLIC CONGESTIVE HEART FAILURE (HCC) WRITTEN ON 11/25/2021 10:20 PM BY EMMY ALF, MD  Living with chronic diastolic HF. She is adherent with her home coreg  6.25mg  BID, losartan  25mg  daily, and jardiance  10mg  daily. She is euvolemic on my exam today. Continue current management.

## 2021-11-25 NOTE — Progress Notes (Signed)
   CC: f/u T2DM, medication refill, encounter for power wheelchair  HPI:  [name] is a 88 y.o. female with history listed below presenting to the [facility] for f/u T2DM, medication refill, encounter for power wheelchair. Please see individualized problem based charting for full HPI.  Past Medical History:  Diagnosis Date   Adenocarcinoma of lung ([place])     Right upper lobe adenocarcinoma. s/p right lower lobectomy 12/15/10   ARTHRITIS, KNEE 03/24/2006   CHF (congestive heart failure) ([place])    CVA (cerebrovascular accident) ([place]) 7124    right embolic stroke, no residual deficits   Depression 12/02/2010   DIABETES MELLITUS, TYPE II 2000   Diverticulitis    GERD 12/04/2005   Hemangioma of liver 12/02/2010   History of meningioma of the brain 07/21/2010   Per MRI 10/2010 stable.    History of pulmonary embolism 04/06/2013   HYPERLIPIDEMIA 12/04/2005   HYPERTENSION 12/04/2005   Meningioma ([place]) 07/21/2010   Right upper lobe, Adenocarcinoma of lung    11/09 - BAL reactive findings no malignancy. 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 Last him him 03/2008 when she fell out of care.  Last imagining 2/10 - stable to minimal increase in RUL mass. CT chest 11/12/10 increasing in size PET scan 11/18/10 Interval increase in size and metabolic activity     Review of Systems:  Negative aside from that listed in individualized problem based charting.  Physical Exam:  Vitals:   11/25/21 1456  BP: (!) 129/51  Pulse: 80  Temp: 97.9 F (36.6 C)  TempSrc: Oral  SpO2: 99%  Weight: 196 lb (88.9 kg)  Height: 5\' 4"  (1.626 m)   Physical Exam Constitutional:      Appearance: She is obese.     Comments: Elderly female, in wheelchair, NAD.  HENT:     Mouth/Throat:     Mouth: Mucous membranes are moist.     Pharynx: Oropharynx is clear. No oropharyngeal exudate.  Cardiovascular:     Rate and  Rhythm: Normal rate and regular rhythm.     Pulses: Normal pulses.     Heart sounds: No murmur heard.    No friction rub. No gallop.  Pulmonary:     Effort: Pulmonary effort is normal.     Breath sounds: Normal breath sounds. No wheezing, rhonchi or rales.  Abdominal:     General: Bowel sounds are normal. There is no distension.     Palpations: Abdomen is soft.     Tenderness: There is no abdominal tenderness.  Musculoskeletal:        General: No swelling.  Skin:    General: Skin is warm and dry.  Neurological:     Mental Status: She is alert. Mental status is at baseline.  Psychiatric:        Mood and Affect: Mood normal.        Behavior: Behavior normal.      Assessment & Plan:   See Encounters Tab for problem based charting.  Patient discussed with Dr. Evette Doffing

## 2021-11-25 NOTE — Assessment & Plan Note (Signed)
Chronic and stable. Continue ASA 81mg  and crestor 20mg  daily for secondary prophylaxis.

## 2021-11-26 ENCOUNTER — Ambulatory Visit: Payer: Self-pay

## 2021-11-26 ENCOUNTER — Telehealth: Payer: Self-pay | Admitting: *Deleted

## 2021-11-26 NOTE — Patient Outreach (Signed)
  Care Coordination   Follow Up Visit Note   11/26/2021 Name: MAYLEA SORIA MRN: [id number] DOB: February 22, 1933  RHYLYNN PERDOMO is a 88 y.o. year old female who sees Virl Axe, MD for primary care. I  collaborated with Hortencia Pilar, Pharmacy to discuss patient obtaining prepackaged medications at no cost.  What matters to the patients health and wellness today?  Getting medications sent to home in prepackaged strip packs.    Goals Addressed   None     SDOH assessments and interventions completed:  Yes     Care Coordination Interventions Activated:  Yes  Care Coordination Interventions:  Yes, provided   Follow up plan: Referral made to Social Work    Encounter Outcome:  Pt. Visit Completed

## 2021-11-26 NOTE — Chronic Care Management (AMB) (Signed)
  Care Coordination   Note   11/26/2021 Name: KATELAND LEISINGER MRN: [id number] DOB: 1933-06-17  CHONG JANUARY is a 88 y.o. year old female who sees Virl Axe, MD for primary care. I reached out to [name] by phone today to offer care coordination services.  Ms. Thayer was given information about Care Coordination services today including:   The Care Coordination services include support from the care team which includes your Nurse Coordinator, Clinical Social Worker, or Pharmacist.  The Care Coordination team is here to help remove barriers to the health concerns and goals most important to you. Care Coordination services are voluntary, and the patient may decline or stop services at any time by request to their care team member.   Care Coordination Consent Status: Patient daughter [name] agreed to services and verbal consent obtained.   Follow up plan:  Telephone appointment with care coordination team member scheduled for:  11/27/21  Encounter Outcome:  Pt. Scheduled  [place]  Direct Dial: [phone]

## 2021-11-26 NOTE — Progress Notes (Signed)
Internal Medicine Clinic Attending  Case discussed with Dr. Jinwala  At the time of the visit.  We reviewed the resident's history and exam and pertinent patient test results.  I agree with the assessment, diagnosis, and plan of care documented in the resident's note.  

## 2021-11-27 ENCOUNTER — Ambulatory Visit: Payer: Self-pay

## 2021-11-27 ENCOUNTER — Ambulatory Visit: Payer: Self-pay | Admitting: Licensed Clinical Social Worker

## 2021-11-27 MED ORDER — ACETAMINOPHEN-CODEINE 300-30 MG PO TABS: ORAL_TABLET | ORAL | 0 refills | Status: DC

## 2021-11-27 NOTE — Telephone Encounter (Signed)
done

## 2021-11-27 NOTE — Patient Instructions (Signed)
Visit Information  Thank you for taking time to visit with me today. Please don't hesitate to contact me if I can be of assistance to you.   Following are the goals we discussed today:   Goals Addressed               This Visit's Progress     Care Coordination Activities- MSW (pt-stated)        Patient will apply for FNS benefits for Mom Patient Will contact [place] regarding transportation Patient denied wanting to use [facility] services due to not liking [facility] Patient inquired on prescription for Tylenol. SW sent inbasket to [facility] and PCP. Patient inquired on life alert. Sw Sent inbasket to [facility] and PCP. Patient stated PCP placed an order for Meals on wheels and a scooter.           Patient verbalizes understanding of instructions and care plan provided today and agrees to view in [place]. Active MyChart status and patient understanding of how to access instructions and care plan via MyChart confirmed with patient.     The care management team will reach out to the patient again over the next 30 days.   Lenor Derrick , MSW Social Worker [facility] Care Management  [phone]

## 2021-11-27 NOTE — Patient Outreach (Signed)
  Care Coordination   Follow Up Visit Note   11/27/2021 Name: [name] MRN: [id number] DOB: 1933/03/14  [name] is a 88 y.o. year old female who sees Virl Axe, MD for primary care. I spoke with  [name] by phone today.  What matters to the patients health and wellness today?  Patient was seen in office by this [facility] and research was done to find an affordable alert system for patient.  Discussed with daughter and provided via email to [name] ([email or web address]) the information found on [employer] Alert system, I spoke with [name] [phone]) and patient would have a $30.95 monthly charge with a $49.95 processing fee and $16.00 shipping charge.  [name] was interested and will call the company.  Patient also had a question about her Tylenol#3 and she only received  7 tabs.  Called [facility] for patient and found out her insurance will only pay for 1 weeks worth of medication at a time. Patient also needs her meds to be prepackaged and delivered to her home. Attached the recent pharmacy list of providers that will package and ship her moms medications.  [name] needs to contact which ever pharmacy she chooses.    Goals Addressed   None     SDOH assessments and interventions completed:  Yes  SDOH Interventions Today    Flowsheet Row Most Recent Value  SDOH Interventions   Transportation Interventions Intervention Not Indicated  Utilities Interventions Intervention Not Indicated  Stress Interventions Intervention Not Indicated        Care Coordination Interventions Activated:  Yes  Care Coordination Interventions:  Yes, provided   Follow up plan: Referral made to MSW    Encounter Outcome:  Pt. Visit Completed

## 2021-11-27 NOTE — Patient Outreach (Signed)
  Care Coordination   Initial Visit Note   11/27/2021 Name: [name] MRN: [id number] DOB: 09-17-1933  [name] is a 88 y.o. year old female who sees Virl Axe, MD for primary care. I spoke with  [name] by phone today.  What matters to the patients health and wellness today?  Life alert, Transportation, FNS, Prescription    Goals Addressed               This Visit's Progress     Care Coordination Activities- MSW (pt-stated)        Patient will apply for FNS benefits for Mom Patient Will contact [place] regarding transportation Patient denied wanting to use SCAT services due to not liking SCAT Patient inquired on prescription for Tylenol. SW sent inbasket to [facility] and PCP. Patient inquired on life alert. Sw Sent inbasket to [facility] and PCP. Patient stated PCP placed an order for Meals on wheels and a scooter.         SDOH assessments and interventions completed:  Yes  SDOH Interventions Today    Flowsheet Row Most Recent Value  SDOH Interventions   Food Insecurity Interventions Assist with SNAP Application  Transportation Interventions SCAT (Specialized Community [employer]poration), Other (Comment)  [Educated patient on [employer] transportation benefit]        Care Coordination Interventions Activated:  Yes  Care Coordination Interventions:  Yes, provided   Follow up plan: Follow up call scheduled for the next 30 days.    Encounter Outcome:  Pt. Visit Completed

## 2021-11-27 NOTE — Patient Outreach (Signed)
  Care Coordination   Multidisciplinary Case Review Note    11/27/2021 Name: [name] MRN: [id number] DOB: [birth year]  [name] is a 88 y.o. year old female who sees Virl Axe, MD for primary care.  The  multidisciplinary care team met today to review patient care needs and barriers.     Goals Addressed               This Visit's Progress     Obtain affordable life alert system (pt-stated)        Care Coordination Interventions: Reviewed medications with patient and discussed Milus Height MSW Collaborated with Milus Height MSW regarding discounts/insurance for Life alert systems Social Work referral for assistance with financial difficulty, obtaining life alert-? medicaid Assessed social determinant of health barriers         SDOH assessments and interventions completed:  Yes  SDOH Interventions Today    Flowsheet Row Most Recent Value  SDOH Interventions   Housing Interventions Intervention Not Indicated  Transportation Interventions Intervention Not Indicated        Care Coordination Interventions Activated:  Yes   Care Coordination Interventions:  Yes, provided   Follow up plan: Referral made to MSW    Multidisciplinary Team Attendees:   Johnney Killian RN, BSN, CCM and Milus Height, MSW  Scribe for Multidisciplinary Case Review:  Johnney Killian RN, BSN, CCM

## 2021-11-27 NOTE — Addendum Note (Signed)
Addended by: Virl Axe on: 11/27/2021 01:01 PM   Modules accepted: Orders

## 2021-11-28 ENCOUNTER — Ambulatory Visit: Payer: Self-pay

## 2021-11-28 NOTE — Patient Outreach (Signed)
  Care Coordination   Follow Up Visit Note   11/28/2021 Name: [name] MRN: [id number] DOB: 1933/04/22  [name] is a 88 y.o. year old female who sees [name] Axe, MD for primary care. I spoke with [name] by phone today.  What matters to the patients health and wellness today?  Verified with [name] she had received the information emailed to her yesterday on affordable life alert and pharmacies that will package and mail her mothers prescriptions.    Goals Addressed   None     SDOH assessments and interventions completed:  Yes  SDOH Interventions Today    Flowsheet Row Most Recent Value  SDOH Interventions   Housing Interventions Intervention Not Indicated  Utilities Interventions Intervention Not Indicated        Care Coordination Interventions Activated:  Yes  Care Coordination Interventions:  Yes, provided   Follow up plan: No further intervention required.   Encounter Outcome:  Pt. Visit Completed

## 2021-12-01 ENCOUNTER — Telehealth: Payer: Self-pay

## 2021-12-01 NOTE — Telephone Encounter (Signed)
Reached out to pt's daughter regarding info on how to obtain pill packs for her mothers medication. Daughter says [facility] is able to complete this for them at no charge.   No further action is needed.

## 2021-12-09 ENCOUNTER — Telehealth: Payer: Self-pay

## 2021-12-09 NOTE — Telephone Encounter (Signed)
Pt's daughter requesting to speak with a nurse about getting wheelchair and life alert. Please call back.

## 2021-12-10 NOTE — Telephone Encounter (Signed)
FYI: The patient Daughter's has called back. F/u on the Referral   Spoke with [facility] and the pt has been given the following instructions and will need to decide between [employer] and [employer] for her Vendor per [facility].   Please see message on the Referral in Reference to the DME Request.  Type Date User Summary Attachment  General 12/02/2021  1:23 PM Lenoard Aden - -  Note: Wheelchair referral . Type Date User Summary Attachment  General 12/02/2021  1:23 PM Lenoard Aden Called patient daughter gave her the information on wheelchair vendors  and wheelchair waitlist process, she will call back with selection -  Note: Called patient daughter gave her the information on wheelchair vendors  and wheelchair waitlist process, she will call back with selection  .

## 2021-12-18 NOTE — Telephone Encounter (Signed)
error 

## 2021-12-31 ENCOUNTER — Ambulatory Visit: Payer: Self-pay | Admitting: Licensed Clinical Social Worker

## 2021-12-31 NOTE — Patient Outreach (Signed)
  Care Coordination   Follow Up Visit Note   12/31/2021 Name: [name] MRN: [id number] DOB: [birth year]  [name] is a 88 y.o. year old female who sees Virl Axe, MD for primary care. I  spoke with son as patient was unavailable. SW attempted patient several times. SW informed son additional attempts to patient would continue.  Son stated no concerns.   What matters to the patients health and wellness today?  No concerns stated by son.     Goals Addressed   None     SDOH assessments and interventions completed:  Yes     Care Coordination Interventions Activated:  Yes  Care Coordination Interventions:  Yes, provided   Follow up plan: Follow up call scheduled for within the next 30 days.    Encounter Outcome:  Pt. Scheduled

## 2022-01-30 ENCOUNTER — Ambulatory Visit: Payer: Self-pay | Admitting: Licensed Clinical Social Worker

## 2022-01-30 NOTE — Patient Outreach (Signed)
SW removed from Care Team.  Darielys Giglia, BSW, MSW, LCSW-A  Social Worker [facility] Care Management  [phone] 

## 2022-02-10 ENCOUNTER — Telehealth: Payer: Self-pay | Admitting: Internal Medicine

## 2022-02-10 NOTE — Telephone Encounter (Signed)
The patient's daughter called in stating that her mom woke up with new back and bilateral leg pain.  She notes that her mom does not normally complain of pain, but has been in a significant amount of pain today.  She denies any recent falls or injuries, and notes that her pain came out of nowhere and that she woke up with it. She normally takes Tylenol #3 for her right knee osteoarthritis pain, but otherwise does not take chronic pain medications.  She denies any fevers, chills, chest pain, shortness of breath, or any other symptoms at this time.  I advised the patient's daughter, that without being able to evaluate her mother, is difficult to say what the etiology of her pain could be.  I also advised her that if her mother's pain is that significant, that she should come into the emergency department for evaluation.  The patient's daughter states that she is getting off of work soon and is likely going to bring her mother in.   Buddy Duty, DO Internal Medicine Resident, PGY-2

## 2022-02-20 ENCOUNTER — Other Ambulatory Visit: Payer: Self-pay | Admitting: Internal Medicine

## 2022-02-20 DIAGNOSIS — E1151 Type 2 diabetes mellitus with diabetic peripheral angiopathy without gangrene: Secondary | ICD-10-CM

## 2022-02-20 DIAGNOSIS — I5032 Chronic diastolic (congestive) heart failure: Secondary | ICD-10-CM

## 2022-02-22 ENCOUNTER — Telehealth: Payer: Self-pay | Admitting: Student

## 2022-02-22 NOTE — Telephone Encounter (Signed)
8:30 PM received a page to call patient back.  I spoke to patient's daughter.  She reports concern of a small wound on patient's left foot that she noticed this afternoon.  She is not aware of that wound before.  She states the patient is doing well, alert and oriented.  Patient currently has no complaint, no fever, no chills, no left lower leg edema or erythema, no tenderness to palpation of her left leg.  Daughter did not notice any purulent discharge from the wound.  She said the wound looks clean and dry.  I recommend an in person visit for [place] to take a look at the wound since patient is diabetic.  I will message the clinic staff to make an appointment.  If patient develops any systemic infection symptoms, they will seek help immediately.  Daughter verbalizes understanding and agrees with the plan.

## 2022-02-23 ENCOUNTER — Other Ambulatory Visit: Payer: Self-pay | Admitting: Internal Medicine

## 2022-02-23 ENCOUNTER — Other Ambulatory Visit: Payer: Self-pay | Admitting: Student

## 2022-02-23 ENCOUNTER — Ambulatory Visit (INDEPENDENT_AMBULATORY_CARE_PROVIDER_SITE_OTHER): Payer: 59 | Admitting: Student

## 2022-02-23 ENCOUNTER — Other Ambulatory Visit: Payer: Self-pay

## 2022-02-23 ENCOUNTER — Encounter: Payer: Self-pay | Admitting: Student

## 2022-02-23 VITALS — BP 129/56 | HR 88 | Temp 98.3°F | Ht 64.0 in | Wt 192.7 lb

## 2022-02-23 DIAGNOSIS — Z7984 Long term (current) use of oral hypoglycemic drugs: Secondary | ICD-10-CM

## 2022-02-23 DIAGNOSIS — N183 Chronic kidney disease, stage 3 unspecified: Secondary | ICD-10-CM

## 2022-02-23 DIAGNOSIS — I998 Other disorder of circulatory system: Secondary | ICD-10-CM

## 2022-02-23 DIAGNOSIS — E1122 Type 2 diabetes mellitus with diabetic chronic kidney disease: Secondary | ICD-10-CM

## 2022-02-23 DIAGNOSIS — I739 Peripheral vascular disease, unspecified: Secondary | ICD-10-CM

## 2022-02-23 DIAGNOSIS — G40909 Epilepsy, unspecified, not intractable, without status epilepticus: Secondary | ICD-10-CM

## 2022-02-23 DIAGNOSIS — Z794 Long term (current) use of insulin: Secondary | ICD-10-CM

## 2022-02-23 DIAGNOSIS — I10 Essential (primary) hypertension: Secondary | ICD-10-CM

## 2022-02-23 DIAGNOSIS — E1151 Type 2 diabetes mellitus with diabetic peripheral angiopathy without gangrene: Secondary | ICD-10-CM

## 2022-02-23 DIAGNOSIS — R053 Chronic cough: Secondary | ICD-10-CM

## 2022-02-23 DIAGNOSIS — F3342 Major depressive disorder, recurrent, in full remission: Secondary | ICD-10-CM

## 2022-02-23 LAB — POCT GLYCOSYLATED HEMOGLOBIN (HGB A1C): Hemoglobin A1C: 8.6 % — AB (ref 4.0–5.6)

## 2022-02-23 LAB — GLUCOSE, CAPILLARY: Glucose-Capillary: 200 mg/dL — ABNORMAL HIGH (ref 70–99)

## 2022-02-23 MED ORDER — EMPAGLIFLOZIN 25 MG PO TABS: 25.0000 mg | ORAL_TABLET | Freq: Every day | ORAL | 0 refills | Status: DC

## 2022-02-23 MED ORDER — OMEPRAZOLE 20 MG PO CPDR: 20.0000 mg | DELAYED_RELEASE_CAPSULE | Freq: Every day | ORAL | 1 refills | Status: DC

## 2022-02-23 NOTE — Progress Notes (Signed)
Subjective:   Patient ID: [name] female   DOB: [birth year] 89 y.o.   MRN: [id number]  HPI: [name] is a 89 y.o. woman with a past medical history of type II diabetes, PAD, and congestive heart failure who presents to [facility] for follow-up of type II diabetes and onset of pain and discoloration of her left foot concerning for acute on chronic acute limb ischemia. Please see problem-based assessment and plan for full details.  Review of Systems: Pertinent items are noted in HPI of problem-based assessment and plan.  Past Medical History:  Diagnosis Date   Adenocarcinoma of lung ([place])     Right upper lobe adenocarcinoma. s/p right lower lobectomy 12/15/10   ARTHRITIS, KNEE 03/24/2006   CHF (congestive heart failure) ([place])    CVA (cerebrovascular accident) ([place]) 4098    right embolic stroke, no residual deficits   Depression 12/02/2010   DIABETES MELLITUS, TYPE II 2000   Diverticulitis    GERD 12/04/2005   Hemangioma of liver 12/02/2010   History of meningioma of the brain 07/21/2010   Per MRI 10/2010 stable.    History of pulmonary embolism 04/06/2013   HYPERLIPIDEMIA 12/04/2005   HYPERTENSION 12/04/2005   Meningioma ([place]) 07/21/2010   Right upper lobe, Adenocarcinoma of lung    11/09 - BAL reactive findings no malignancy. 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 Last him him 03/2008 when she fell out of care.  Last imagining 2/10 - stable to minimal increase in RUL mass. CT chest 11/12/10 increasing in size PET scan 11/18/10 Interval increase in size and metabolic activity     Patient Active Problem List   Diagnosis Date Noted   Physical deconditioning 11/25/2021   Rotator cuff arthropathy 05/19/2021   Lipoma 02/19/2021   CKD stage 3 due to type 2 diabetes mellitus ([place]) 07/05/2018   PAD (peripheral artery disease) ([place]) 06/10/2017   Liver hemangioma 11/13/2016    Obesity (BMI 30.0-34.9) 08/20/2016   Hearing loss 08/20/2016   Osteoarthritis of right knee 10/30/2014   Mixed incontinence 07/21/2014   Seizure disorder ([place]) 10/06/2013   Normocytic anemia 04/18/2013   Healthcare maintenance 02/24/2011   Depression 12/02/2010   Chronic diastolic congestive heart failure ([facility])    Incontinence of urine 11/29/2007   Type 2 diabetes mellitus ([place]) 12/04/2005   Hyperlipidemia associated with type 2 diabetes mellitus ([place]) 12/04/2005   Hypertension associated with diabetes ([place]) 12/04/2005   GERD 12/04/2005   History of CVA (cerebrovascular accident) 12/02/2002     Current Outpatient Medications  Medication Sig Dispense Refill   Accu-Chek FastClix Lancets MISC CHECK BLOOD SUGAR THREE TIMES A DAY. DIAG CODE E11.9. INSULIN DEPENDENT 306 each 11   acetaminophen-codeine (TYLENOL #3) 300-30 MG tablet TAKE 1 TABLET BY MOUTH DAILY AS NEEDED FOR MODERATE PAIN OR SEVERE PAIN. 30 tablet 0   aspirin EC 81 MG tablet Take 1 tablet (81 mg total) by mouth daily. 90 tablet 2   barrier cream (NON-SPECIFIED) CREA Apply 1 application topically 2 (two) times daily as needed (place on vulva twice daily as needed for redness and swelling). 1 each 1   Blood Glucose Monitoring Suppl (ACCU-CHEK GUIDE) w/Device KIT 1 each by Does not apply route 3 (three) times daily. 1 kit 0   carvedilol (COREG) 6.25 MG tablet Take 1 tablet (6.25 mg total) by mouth 2 (two) times daily with  a meal. 180 tablet 3   cilostazol (PLETAL) 100 MG tablet Take 1 tablet (100 mg total) by mouth 2 (two) times daily. 180 tablet 1   FLUoxetine (PROZAC) 10 MG capsule TAKE 1 CAPSULE BY MOUTH EVERY DAY 90 capsule 1   glucose blood (ACCU-CHEK GUIDE) test strip Check blood sugar 3 times per day 300 each 3   Incontinence Supplies MISC For use to address bowel and bladder incontinence. 30 each 0   Insulin Pen Needle (BD PEN NEEDLE NANO U/F) 32G X 4 MM MISC USE TO ADMINISTER SOLIQUA ONE TIME A DAY 100 each 3   Insulin  Pen Needle 32G X 4 MM MISC Use to administer soliqua one time a day 100 each 3   JARDIANCE 10 MG TABS tablet TAKE 1 TABLET BY MOUTH EVERY DAY 90 tablet 1   levETIRAcetam (KEPPRA) 750 MG tablet Take 1 tablet (750 mg total) by mouth 2 (two) times daily. 180 tablet 3   losartan (COZAAR) 25 MG tablet Take 1 tablet (25 mg total) by mouth daily. 90 tablet 1   rosuvastatin (CRESTOR) 20 MG tablet Take 1 tablet (20 mg total) by mouth daily. 90 tablet 1   No current facility-administered medications for this visit.     Objective:   Physical Exam: Vitals:   02/23/22 1429 02/23/22 1437  BP: (!) 139/52 (!) 129/56  Pulse: 89 88  Temp: 98.3 F (36.8 C)   TempSrc: Oral   SpO2: 94%   Weight: 192 lb 11.2 oz (87.4 kg)   Height: 5\' 4"  (1.626 m)     Constitutional: elderly woman, in no acute distress, in wheelchair Cardiovascular: regular rate with normal rhythm, no murmurs Pulmonary/Chest: normal work of breathing on room air, lungs with transmitted airway sounds on auscultation Abdominal: soft, non-tender, non-distended, bowel sounds present MSK: no lower extremity edema Skin: Cap refill <2 seconds in fingers. Left foot with discoloration and pallor up to ankle. Tender to palpation from left foot up to ankle and cool to touch. Ulceration spots around toes with no obvious signs of infection. Faint left pedal pulse. Right foot and ankle with normal skin. Please see media tab for picture Neurological: alert and answering questions appropriately. Psych: appropriate mood and affect   Assessment & Plan:   PAD (peripheral artery disease) ([place]) Patient presents today with left foot pain and discoloration concerning for acute on chronic limb ischemia. Patient reports that she has been experiencing pain in her left foot for multiple weeks particularly when bearing weight on the foot. She is not sure when the foot became discolored - her daughter reports that she noticed patient's foot looking dark and  discolored yesterday and applied Vaseline lotion to the area. Daughter is not sure when the discoloration exactly began. Patient denies fever or chills with the foot symptoms. She reports pain is improved today but has been bothering her for a few weeks. On exam, the left foot is discolored and displays pallor up to ankle. L foot is tender to palpation from foot up to ankle and cool to touch. There are ulceration spots around toes with no obvious signs of infection. There is a faint to nonpalpable pedal pulse. Left foot and toes strength and sensation are intact. Right foot and ankle with normal skin findings.  Assessment: Patient has history of PAD and type II DM, and her presentation of left foot with pain, pallor, and cold to palpation are concerning for acute on chronic limb ischemia. There are no obvious signs of  acute infection at this time. Given patient's chronic medical conditions including congestive heart failure and sedentary lifestyle due to physical deconditioning, she has not been an optimal candidate for surgery and has undergone conservative management with medications. Given her presentation of left foot acute on chronic limb ischemia today, we have discussed with the interventional radiology team who plan to meet with her this week to evaluate and discuss intervention options.  Plan -Referral placed to interventional radiology team -Referral placed to podiatry  -Encouraged patient to gradually ease into walking after treatment to help with circulation to limb -Continue cilostazol 100 mg BID, Crestor 20 mg daily, and ASA 81 mg daily -Follow-up in 1 month  Type 2 diabetes mellitus ([place]) A1c at 8.6% today, up from 7.8% in October. Given her advanced age and comorbid conditions, her A1c goal is <8%. She is on Jardiance 10 mg daily and has been on insulin in the past. Given her A1c today and history of congestive heart failure, will increase Jardiance to 25 mg daily.  Plan -Increase  Jardiance 10 mg daily to 25 mg daily  Chronic cough Patient reports a chronic cough that has been going on for about 8 weeks or more. She reports coughing up phlegm with the cough sometimes. She denies wheezing, difficulty breathing, or shortness of breath. She has tried OTC remedies for the cough. She is a former tobacco user with the last use being over 10 years ago. She does not have a clear history of asthma or COPD in her chart. Her persistent cough could be from reflux or post-viral URI. She has been on omeprazole for GERD in the past. Given the chronic nature of her cough, will plan to further evaluate with chest x-ray.   Plan -Chest x-ray order placed -Re-start omeprazole 20 mg daily -Can consider future PFTs    [name] (Max) [place], MS3 02/23/2022, 5:38 PM

## 2022-02-23 NOTE — Telephone Encounter (Signed)
RTC to patient spoke with son who did not know what was going on with patient.  Asked son to contact daughter.  Gave son the appointment for this afternoon at 2:15 PM.   Call later from daughter stating  no one had returned her call.  Daugher was given numbers that were called. Said that 1st number was having problems with and the second she did not know.  Told her the person answering stated he was her son.  Has not spoken to brother this morning.  Daughter states patent had a few sores on her foot.  Had very dry skin.

## 2022-02-23 NOTE — Assessment & Plan Note (Addendum)
A1c at 8.6% today, up from 7.8% in October. Given her advanced age and comorbid conditions, her A1c goal is <8%. She is on Jardiance 10 mg daily and has been on insulin in the past. Given her A1c today and history of congestive heart failure, will increase Jardiance to 25 mg daily.  Plan -Increase Jardiance 10 mg daily to 25 mg daily

## 2022-02-23 NOTE — Assessment & Plan Note (Signed)
Patient reports a chronic cough that has been going on for about 8 weeks or more. She reports coughing up phlegm with the cough sometimes. She denies wheezing, difficulty breathing, or shortness of breath. She has tried OTC remedies for the cough. She is a former tobacco user with the last use being over 10 years ago. She does not have a clear history of asthma or COPD in her chart. Her persistent cough could be from reflux or post-viral URI. She has been on omeprazole for GERD in the past. Given the chronic nature of her cough, will plan to further evaluate with chest x-ray.   Plan -Chest x-ray order placed -Re-start omeprazole 20 mg daily -Can consider future PFTs

## 2022-02-23 NOTE — Assessment & Plan Note (Addendum)
Patient presents today with left foot pain and discoloration concerning for acute on chronic limb ischemia. Patient reports that she has been experiencing pain in her left foot for multiple weeks particularly when bearing weight on the foot. She is not sure when the foot became discolored - her daughter reports that she noticed patient's foot looking dark and discolored yesterday and applied Vaseline lotion to the area. Daughter is not sure when the discoloration exactly began. Patient denies fever or chills with the foot symptoms. She reports pain is improved today but has been bothering her for a few weeks. On exam, the left foot is discolored and displays pallor up to ankle. L foot is tender to palpation from foot up to ankle and cool to touch. There are ulceration spots around toes with no obvious signs of infection. There is a faint to nonpalpable pedal pulse. Left foot and toes strength and sensation are intact. Right foot and ankle with normal skin findings.  Assessment: Patient has history of PAD and type II DM, and her presentation of left foot with pain, pallor, and cold to palpation are concerning for acute on chronic limb ischemia. There are no obvious signs of acute infection at this time. Given patient's chronic medical conditions including congestive heart failure and sedentary lifestyle due to physical deconditioning, she has not been an optimal candidate for surgery and has undergone conservative management with medications. Given her presentation of left foot acute on chronic limb ischemia today, we have discussed with the interventional radiology team who plan to meet with her this week to evaluate and discuss intervention options.  Plan -Referral placed to interventional radiology team -Referral placed to podiatry  -Encouraged patient to gradually ease into walking after treatment to help with circulation to limb -Continue cilostazol 100 mg BID, Crestor 20 mg daily, and ASA 81 mg  daily -Follow-up in 1 month

## 2022-02-23 NOTE — Patient Instructions (Addendum)
Ms. Devoss,  It was great to see you in clinic today.  Below is what we discussed today:  For your left foot - We have placed a referral to podiatry for you and have reached out to the interventional radiology team. The interventional radiology team plans to reach out and schedule you to be seen in their clinic as soon as possible for options to improve flow to your foot. We recommend following-up with our clinic in 1 month.  Your A1c is elevated at 8.6% today - the goal is to keep this at less than 8%. We will increase your Jardiance dose to help with this.  For your chronic cough - We have placed an order for a chest x-ray - you will be contacted about scheduling for this.  Please note the following changes to your medications:  Increase Jardiance 10 mg daily to 25 mg daily  It is a pleasure to be a part of your team, thank you for allowing [place] to be a part of your care,  Max and Dr. Johnney Ou  If you need medication refills please notify your pharmacy one week in advance and they will send [place] a request.

## 2022-02-24 LAB — BMP8+ANION GAP
Anion Gap: 18 mmol/L (ref 10.0–18.0)
BUN/Creatinine Ratio: 18 (ref 12–28)
BUN: 21 mg/dL (ref 8–27)
CO2: 19 mmol/L — ABNORMAL LOW (ref 20–29)
Calcium: 8.5 mg/dL — ABNORMAL LOW (ref 8.7–10.3)
Chloride: 101 mmol/L (ref 96–106)
Creatinine, Ser: 1.15 mg/dL — ABNORMAL HIGH (ref 0.57–1.00)
Glucose: 220 mg/dL — ABNORMAL HIGH (ref 70–99)
Potassium: 3.7 mmol/L (ref 3.5–5.2)
Sodium: 138 mmol/L (ref 134–144)
eGFR: 46 mL/min/{1.73_m2} — ABNORMAL LOW (ref >59)

## 2022-02-24 NOTE — Progress Notes (Signed)
Attestation for Student Documentation:  I personally was present and performed or re-performed the history, physical exam and medical decision-making activities of this service and have verified that the service and findings are accurately documented in the student's note.  In short Ms Hughson is an 89 y/o F with a PMHX of T2DM, PAD, CHF who presented for discoloration and pain of the left foot. Agree with Dr. Veatrice Kells, acute on chronic limb ischemia in a patient who was deemed to not be a candidate for invasive procedures secondary to her co-morbid conditions by her cardiologist. Do not believe needs emergent evaluation or admission at this time, discussed with Dr. Earleen Newport of IR who will plan to urgently get patient into his clinic for evaluation and possible less invasive interventions.  For her chronic cough (now 8 weeks since onset) will restart PPI and obtain chest xray with significant tobacco use history. If negative imaging and persistent symptoms may need PFT's. Remote history of needing albuterol inhaler but do not see work up for asthma/COPD documented.   With her T2DM, rather than adding back additional medications will increase jardiance to hopefully place her in a goal A1C of 7-8%.   Riesa Pope, MD 02/24/2022, 8:56 AM

## 2022-02-27 ENCOUNTER — Other Ambulatory Visit: Payer: 59

## 2022-03-01 NOTE — Progress Notes (Signed)
Internal Medicine Clinic Attending  Case discussed with Dr. Evie Lacks  at the time of the visit.  We reviewed the resident's history and exam and pertinent patient test results.  I agree with the assessment, diagnosis, and plan of care documented in the resident's note.

## 2022-03-01 NOTE — Addendum Note (Signed)
Addended by: Debe Coder B on: 03/01/2022 12:17 PM   Modules accepted: Level of Service

## 2022-03-03 ENCOUNTER — Encounter: Payer: Medicare Other | Admitting: Student

## 2022-03-03 ENCOUNTER — Ambulatory Visit: Payer: 59 | Admitting: Physical Therapy

## 2022-03-04 ENCOUNTER — Telehealth: Payer: Self-pay | Admitting: Internal Medicine

## 2022-03-04 NOTE — Telephone Encounter (Signed)
Patient's daughter called after-hours pager as her mother started having diarrhea last night. She only had one episode of diarrhea and it was non-bloody. The patient has not had any fevers, chills, abd pain, n/v, or any other sxs at this time. She is drinking a lot of fluids, but has not had much to eat. Aside from this isolated episode of diarrhea, the patient is at her baseline.  Advised the patient's daughter that she can use imodium if she has another episode of diarrhea, but otherwise, we can plan to get her an appt with the [facility] soon.   Elza Rafter, DO

## 2022-03-05 ENCOUNTER — Telehealth: Payer: Self-pay | Admitting: Student

## 2022-03-05 ENCOUNTER — Other Ambulatory Visit: Payer: Self-pay

## 2022-03-05 ENCOUNTER — Ambulatory Visit (INDEPENDENT_AMBULATORY_CARE_PROVIDER_SITE_OTHER): Payer: 59 | Admitting: Student

## 2022-03-05 ENCOUNTER — Encounter: Payer: Self-pay | Admitting: Student

## 2022-03-05 VITALS — BP 178/83 | HR 96 | Temp 98.8°F | Resp 28 | Ht 64.0 in

## 2022-03-05 DIAGNOSIS — R197 Diarrhea, unspecified: Secondary | ICD-10-CM

## 2022-03-05 DIAGNOSIS — B3731 Acute candidiasis of vulva and vagina: Secondary | ICD-10-CM

## 2022-03-05 DIAGNOSIS — E1159 Type 2 diabetes mellitus with other circulatory complications: Secondary | ICD-10-CM

## 2022-03-05 DIAGNOSIS — Z794 Long term (current) use of insulin: Secondary | ICD-10-CM

## 2022-03-05 DIAGNOSIS — I152 Hypertension secondary to endocrine disorders: Secondary | ICD-10-CM

## 2022-03-05 DIAGNOSIS — Z87891 Personal history of nicotine dependence: Secondary | ICD-10-CM

## 2022-03-05 DIAGNOSIS — E1151 Type 2 diabetes mellitus with diabetic peripheral angiopathy without gangrene: Secondary | ICD-10-CM

## 2022-03-05 MED ORDER — FLUCONAZOLE 200 MG PO TABS: 200.0000 mg | ORAL_TABLET | ORAL | 0 refills | Status: DC

## 2022-03-05 NOTE — Progress Notes (Signed)
   CC: urinary incontinence, diarrhea, and redness/swelling of vulva/vagina  HPI:  [name] is a 89 y.o. female with history listed below presenting to the [facility] for urinary incontinence, diarrhea, redness/swelling of vulva/vagina. Please see individualized problem based charting for full HPI.  Past Medical History:  Diagnosis Date   Adenocarcinoma of lung ([place])     Right upper lobe adenocarcinoma. s/p right lower lobectomy 12/15/10   ARTHRITIS, KNEE 03/24/2006   CHF (congestive heart failure) ([place])    CVA (cerebrovascular accident) ([place]) 4970   right embolic stroke, no residual deficits   Depression 12/02/2010   DIABETES MELLITUS, TYPE II 2000   Diverticulitis    GERD 12/04/2005   Hemangioma of liver 12/02/2010   History of meningioma of the brain 07/21/2010   Per MRI 10/2010 stable.    History of pulmonary embolism 04/06/2013   HYPERLIPIDEMIA 12/04/2005   HYPERTENSION 12/04/2005   Meningioma ([place]) 07/21/2010   Right upper lobe, Adenocarcinoma of lung    11/09 - BAL reactive findings no malignancy. 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 Last him him 03/2008 when she fell out of care.  Last imagining 2/10 - stable to minimal increase in RUL mass. CT chest 11/12/10 increasing in size PET scan 11/18/10 Interval increase in size and metabolic activity    Rotator cuff arthropathy 05/19/2021    Review of Systems:  Negative aside from that listed in individualized problem based charting.  Physical Exam:  Vitals:   03/05/22 1527 03/05/22 1535  BP: (!) 170/73 (!) 178/83  Pulse: 98 96  Resp: (!) 28   Temp: 98.8 F (37.1 C)   TempSrc: Oral   SpO2: 96%   Height: 5\' 4"  (1.626 m)    Physical Exam Constitutional:      Comments: Chronically ill-appearing  HENT:     Mouth/Throat:     Mouth: Mucous membranes are moist.     Pharynx: Oropharynx is clear.  Eyes:      Extraocular Movements: Extraocular movements intact.     Conjunctiva/sclera: Conjunctivae normal.     Pupils: Pupils are equal, round, and reactive to light.  Cardiovascular:     Rate and Rhythm: Normal rate and regular rhythm.     Heart sounds: Normal heart sounds. No murmur heard.    No gallop.  Pulmonary:     Effort: Pulmonary effort is normal.     Breath sounds: Normal breath sounds. No wheezing, rhonchi or rales.  Abdominal:     General: Bowel sounds are normal. There is no distension.     Palpations: Abdomen is soft.     Tenderness: There is no abdominal tenderness. There is no guarding or rebound.  Genitourinary:    Comments: Significant redness and swelling of vulva, vagina, and inguinal areas bilaterally. White, curd-like vaginal discharge noted. Musculoskeletal:        General: Normal range of motion.  Skin:    General: Skin is warm and dry.  Neurological:     General: No focal deficit present.     Mental Status: She is alert. Mental status is at baseline.  Psychiatric:        Mood and Affect: Mood normal.        Behavior: Behavior normal.      Assessment & Plan:   See Encounters Tab for problem based charting.  Patient seen with Dr. Philipp Ovens

## 2022-03-05 NOTE — Telephone Encounter (Signed)
Please refer to message below.  Contacted patients' daughter, Etheleen Sia, to see if she could bring her mom in this afternoon to see Dr. Austin Miles at 2:15 pm.  States they had to stay in a hotel last night due to no heat in their apartment.  The hotel has steep stairs which makes it difficult to transport patient up and down the stairs.  Will call back to reschedule if unable to bring patient to her appointment.

## 2022-03-05 NOTE — Assessment & Plan Note (Addendum)
Patient with 2-day history of significant redness and swelling of her vulva, vagina, and inguinal areas bilaterally. Caregivers note that she has been having more urinary incontinence during this and has been itching her genital area frequently over the past 2 days. On exam, she does have significant redness and swelling of her vulva, vagina, and inguinal areas. Also noted to have white, curd-like vaginal discharge. She is afebrile. Given history and physical exam findings, this is consistent with vulvovaginal candidiasis. She has a recent dose increase of her jardiance from 10mg  to 25mg  daily about 1 week ago. Given chronology, it is reasonable to suspect that increased jardiance dose may have precipitated development of vulvovaginal candidiasis. Will stop jardiance therapy while treating infection. Given uncontrolled T2DM and severity of symptoms, will treat with extended course of diflucan. Prescribed diflucan 200mg  once weekly for 4-week course. Will have her follow up in 2 weeks to assess for improvement in symptoms.  Plan: -stop jardiance -diflucan 200mg  once weekly for 4 weeks -f/u in 2 weeks

## 2022-03-05 NOTE — Assessment & Plan Note (Signed)
Patient experiencing diarrhea and associated fecal incontinence over the past 2 days. Caregivers state that she has been soiling herself relatively frequently over this time with loose stools noted in her bed. Although her jardiance was recently increased, likely not the culprit for diarrhea. She is afebrile. Will check a GI panel for presumed viral gastroenteritis. This diarrhea is likely self-limiting.  Plan: -encouraged adequate hydration -f/u GI path panel -f/u in 2 weeks

## 2022-03-05 NOTE — Assessment & Plan Note (Signed)
Patient presenting with worsened urinary incontinence and vulvovaginal candidiasis after recent dose increase of jardiance from 10mg  to 25mg  daily. Her most recent A1c was 8.6%. Given chronology, it is possible that increased jardiance dose has resulted in current infection. Will discontinue jardiance at this time while we treat her active infection. Afterwards, will need to decide on diabetic regimen. Can consider restarting jardiance at reduced dose of 10mg  as she was tolerating this. Will need to discuss if metformin or GLP-1a therapy may be an option for her (metformin discontinued in the past due to GI side effects).   Plan: -stop jardiance while treating current infection -f/u in 2 weeks -consider restarting jardiance at 10mg  dose -consider adding metformin or GLP-1a therapy

## 2022-03-05 NOTE — Patient Instructions (Signed)
[name],  It was a pleasure seeing you in the clinic today.   It appears that you have a yeast infection which I think is causing your swelling and itching. I have prescribed an antifungal medicine called Fluconazole. Please take 1 tablet once a week for 4 weeks. We are stopping your jardiance temporarily until the yeast infection resolves.  For your diarrhea, we are checking a stool sample to see if there is an infection causing this. I will call you with the results. Please come back in 2 weeks for your next visit.  Please call our clinic at [phone] if you have any questions or concerns. The best time to call is Monday-Friday from 9am-4pm, but there is someone available 24/7 at the same number. If you need medication refills, please notify your pharmacy one week in advance and they will send [place] a request.   Thank you for letting us take part in your care. We look forward to seeing you next time!

## 2022-03-05 NOTE — Telephone Encounter (Signed)
-----  Message from Chauncey Mann, DO sent at 03/04/2022  5:42 PM EST ----- This patient's daughter called the after-hours pager, can we get her an appt later this week for a follow up of diarrhea?

## 2022-03-05 NOTE — Assessment & Plan Note (Signed)
Patient living with HTN, currently taking coreg 6.25mg  BID, losartan 25mg  daily, and jardiance 25mg  daily. BP is elevated today at 170/73 with repeat 178/83. Caregivers state that she has not taken her coreg or losartan today given difficulties finding transportation to [facility] leading to missed dose. We will be stopping jardiance at this time given vulvovaginal candidiasis likely from increased jardiance dose. Anticipate improved BP control after taking coreg and losartan.  Plan: -continue losartan and coreg -stop jardiance -f/u in 2 weeks, can adjust BP meds if still uncontrolled

## 2022-03-07 LAB — GI PROFILE, STOOL, PCR

## 2022-03-12 ENCOUNTER — Other Ambulatory Visit: Payer: Self-pay | Admitting: Interventional Radiology

## 2022-03-12 ENCOUNTER — Ambulatory Visit
Admission: RE | Admit: 2022-03-12 | Discharge: 2022-03-12 | Disposition: A | Discharge status: Home / Self Care | Payer: 59 | Source: Ambulatory Visit | Attending: Interventional Radiology | Admitting: Interventional Radiology

## 2022-03-12 DIAGNOSIS — I739 Peripheral vascular disease, unspecified: Secondary | ICD-10-CM

## 2022-03-12 DIAGNOSIS — I70222 Atherosclerosis of native arteries of extremities with rest pain, left leg: Secondary | ICD-10-CM | POA: Diagnosis not present

## 2022-03-12 HISTORY — PX: IR RADIOLOGIST EVAL & MGMT: IMG5224

## 2022-03-12 NOTE — Consult Note (Signed)
Chief Complaint: Left foot pain  Referring Physician(s): Dr. Riesa Pope  History of Present Illness: [name] is a 89 y.o. female presenting with her family today to [facility] as a scheduled consultation, kindly referred by Dr. Johnney Ou, for evaluation of left foot pain and vascular insufficiency etiology.   [name] joins [place] today with her daughter, who is her POA, and her son-in-law.    [name] has poor hearing, and did not contribute much today to the history or the interview.  Her daughter was most useful with the history.   Apparently [name] Spurlock was ambulating normally and completing all of her ADL's comfortably until only a few months ago, in the November-December time frame, when she began to have some redness in the left forefoot.  This was associated with pain, inferred not by her complaint of pain, but instead by unwillingness to bear weight and ambulate.  She has never complained of any pain or wound in the past.  I cannot elicit any history of claudication in the past.    I am able to speak loudly enough to ask [name] about the pain in the left foot.  She confirms that it is quite uncomfortable, and that in fact it is worse at night.  This wakes her from sleep and she confirms that she hangs over the side of the bed to give some relief.  She does not ambulate at  night because this is more uncomfortable.    She was treated about 2006 for a stroke.  No history of MI.  She has positive history for CHF.  Her daughter states she has not been hospitalized in some time.    CV risk factors: tobacco hx (now quit), DM II, HLD, HTN.     Prior non-invasive exam 2019: Right ABI:  0.46.  Great toe pressure 26, indicating poor wound healing capability Left ABI: 0.51.  Great toe pressure 47.  Borderline wound healing capability.   Past Medical History:  Diagnosis Date   Adenocarcinoma of lung ([place])     Right upper lobe adenocarcinoma. s/p right lower lobectomy  12/15/10   ARTHRITIS, KNEE 03/24/2006   CHF (congestive heart failure) ([place])    CVA (cerebrovascular accident) ([place]) 2426   right embolic stroke, no residual deficits   Depression 12/02/2010   DIABETES MELLITUS, TYPE II 2000   Diverticulitis    GERD 12/04/2005   Hemangioma of liver 12/02/2010   History of meningioma of the brain 07/21/2010   Per MRI 10/2010 stable.    History of pulmonary embolism 04/06/2013   HYPERLIPIDEMIA 12/04/2005   HYPERTENSION 12/04/2005   Meningioma ([place]) 07/21/2010   Right upper lobe, Adenocarcinoma of lung    11/09 - BAL reactive findings no malignancy. 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 Last him him 03/2008 when she fell out of care.  Last imagining 2/10 - stable to minimal increase in RUL mass. CT chest 11/12/10 increasing in size PET scan 11/18/10 Interval increase in size and metabolic activity    Rotator cuff arthropathy 05/19/2021    Past Surgical History:  Procedure Laterality Date   ABDOMINAL HYSTERECTOMY     CRANIOTOMY N/A 03/02/2013   Procedure: CRANIOTOMY TUMOR EXCISION;  Surgeon: Winfield Cunas, MD;  Location: [facility];  Service: Neurosurgery;  Laterality: N/A;  Bifrontal Craniotomy for tumor   Extracapsular cataract extraction with intraocular  lens implantation.   INCISION AND DRAINAGE PERIRECTAL ABSCESS N/A 01/19/2013   Procedure: IRRIGATION AND DEBRIDEMENT PERIRECTAL ABSCESS;  Surgeon: Zenovia Jarred, MD;  Location: [facility];  Service: General;  Laterality: N/A;   RIGHT VATS,RIGHT THORACOTOMY,RIGHT LOWER LOBECTOMY WITH NODE DISSECTION     Video bronchoscope.  12/29/2007   [place]   Wide excision of left upper back mass.      Allergies: Patient has no known allergies.  Medications: Prior to Admission medications   Medication Sig Start Date End Date Taking? Authorizing Provider  Accu-Chek FastClix Lancets MISC CHECK BLOOD SUGAR  THREE TIMES A DAY. DIAG CODE E11.9. INSULIN DEPENDENT 10/23/20   Virl Axe, MD  acetaminophen-codeine (TYLENOL #3) 300-30 MG tablet TAKE 1 TABLET BY MOUTH DAILY AS NEEDED FOR MODERATE PAIN OR SEVERE PAIN. 11/27/21   Virl Axe, MD  aspirin EC 81 MG tablet Take 1 tablet (81 mg total) by mouth daily. 02/18/21   Mitzi Hansen, MD  barrier cream (NON-SPECIFIED) CREA Apply 1 application topically 2 (two) times daily as needed (place on vulva twice daily as needed for redness and swelling). 10/23/20   Virl Axe, MD  Blood Glucose Monitoring Suppl (ACCU-CHEK GUIDE) w/Device KIT 1 each by Does not apply route 3 (three) times daily. 10/23/20   Virl Axe, MD  carvedilol (COREG) 6.25 MG tablet TAKE 1 TABLET BY MOUTH 2 TIMES DAILY WITH A MEAL. 02/24/22   Virl Axe, MD  cilostazol (PLETAL) 100 MG tablet TAKE 1 TABLET BY MOUTH TWICE A DAY 02/24/22   Virl Axe, MD  fluconazole (DIFLUCAN) 200 MG tablet Take 1 tablet (200 mg total) by mouth once a week. 03/05/22   Virl Axe, MD  FLUoxetine (PROZAC) 10 MG capsule TAKE 1 CAPSULE BY MOUTH EVERY DAY 02/24/22   Virl Axe, MD  glucose blood (ACCU-CHEK GUIDE) test strip Check blood sugar 3 times per day 10/23/20   Virl Axe, MD  Incontinence Supplies MISC For use to address bowel and bladder incontinence. 07/04/15   Liberty Handy, MD  Insulin Pen Needle (BD PEN NEEDLE NANO U/F) 32G X 4 MM MISC USE TO ADMINISTER SOLIQUA ONE TIME A DAY 01/01/21   Virl Axe, MD  Insulin Pen Needle 32G X 4 MM MISC Use to administer soliqua one time a day 12/23/20   Virl Axe, MD  levETIRAcetam (KEPPRA) 750 MG tablet TAKE 1 TABLET BY MOUTH 2 TIMES DAILY. 02/24/22   Virl Axe, MD  losartan (COZAAR) 25 MG tablet TAKE 1 TABLET (25 MG TOTAL) BY MOUTH DAILY. 02/24/22   Virl Axe, MD  omeprazole (PRILOSEC) 20 MG capsule Take 1 capsule (20 mg total) by mouth daily. 02/23/22   Riesa Pope, MD  rosuvastatin (CRESTOR) 20 MG tablet TAKE 1 TABLET BY MOUTH  EVERY DAY 02/24/22   Virl Axe, MD     Family History  Problem Relation Age of Onset   Hyperlipidemia Brother    Hypertension Brother    Diabetes Brother     Social History   Socioeconomic History   Marital status: Widowed    Spouse name: Not on file   Number of children: 5   Years of education: 10   Highest education level: Not on file  Occupational History    Employer: UNEMPLOYED  Tobacco Use   Smoking status: Former    Types: Cigarettes    Quit date: 02/17/2000    Years since quitting: 22.0   Smokeless tobacco: Never  Substance and Sexual Activity   Alcohol use: No  Alcohol/week: 0.0 standard drinks of alcohol   Drug use: No   Sexual activity: Not Currently  Other Topics Concern   Not on file  Social History Narrative   Patient requests to use [facility] for her diabetes testing supplies as of 09/05/2009.   Lives at home with daughter, [name].   Caffeine use: 1 cup coffee/day   Social Determinants of Health   Financial Resource Strain: High Risk (11/26/2021)   Overall Financial Resource Strain (CARDIA)    Difficulty of Paying Living Expenses: Very hard  Food Insecurity: No Food Insecurity (02/23/2022)   Hunger Vital Sign    Worried About Running Out of Food in the Last Year: Never true    [place] in the Last Year: Never true  Recent Concern: Food Insecurity - Food Insecurity Present (11/27/2021)   Hunger Vital Sign    Worried About Running Out of Food in the Last Year: Sometimes true    Ran Out of Food in the Last Year: Sometimes true  Transportation Needs: No Transportation Needs (02/23/2022)   PRAPARE - Hydrologist (Medical): No    Lack of Transportation (Non-Medical): No  Recent Concern: Transportation Needs - Unmet Transportation Needs (11/27/2021)   PRAPARE - Hydrologist (Medical): Yes    Lack of Transportation (Non-Medical): No  Physical Activity: Not on file   Stress: Stress Concern Present (11/27/2021)   [place]    Feeling of Stress : To some extent  Social Connections: Socially Isolated (02/23/2022)   Social Connection and Isolation Panel [NHANES]    Frequency of Communication with Friends and Family: Once a week    Frequency of Social Gatherings with Friends and Family: Once a week    Attends Religious Services: Never    Marine scientist or Organizations: No    Attends Archivist Meetings: Never    Marital Status: Widowed       Review of Systems: A 12 point ROS discussed and pertinent positives are indicated in the HPI above.  All other systems are negative.  Review of Systems  Vital Signs: BP (!) 105/52 (BP Location: Right Arm)   Pulse 82   SpO2 94%   Advance Care Plan: The advanced care plan/surrogate decision maker was discussed at the time of visit and documented in the medical record.    Physical Exam General: 89 yo female appearing stated age.  Well-developed, well-nourished.  No distress. HEENT: Atraumatic, normocephalic.  Xanthelasma. Left ptosis. No scleral icterus or scleral injection. No lesions on external ears, nose, lips, or gums.  Oral mucosa moist, pink.  Neck: Symmetric with no goiter enlargement.  Chest/Lungs:  Symmetric chest with inspiration/expiration.  No labored breathing.  Clear to auscultation with no wheezes, rhonchi, or rales.  Heart:  RRR, with no third heart sounds appreciated. No JVD appreciated.  Abdomen:  Soft, NT/ND, with + bowel sounds.   Genito-urinary: Deferred Pulse Exam:  No bruit appreciated.   Doppler positive right AT & PT.  Monophasic left PT.  No DP was doppler.    Extremities: rubor of the left forefoot.  No wound.  TTP     Imaging: No results found.  Labs:  CBC: No results for input(s): "WBC", "HGB", "HCT", "PLT" in the last 8760 hours.  COAGS: No results for input(s): "INR", "APTT" in the  last 8760 hours.  BMP: Recent Labs  02/23/22 1441  NA 138  K 3.7  CL 101  CO2 19*  GLUCOSE 220*  BUN 21  CALCIUM 8.5*  CREATININE 1.15*    LIVER FUNCTION TESTS: No results for input(s): "BILITOT", "AST", "ALT", "ALKPHOS", "PROT", "ALBUMIN" in the last 8760 hours.  TUMOR MARKERS: No results for input(s): "AFPTM", "CEA", "CA199", "CHROMGRNA" in the last 8760 hours.  Assessment and Plan:  Assessment:  [name] Aylssa Herrig is a 89 yo female  presenting with rutherford 4 category symptoms of CLTI/CLI of the left foot.    Previous non-invasive ABI of 2019 shows right ABI of 0.46, and left ABI of 0.51.    I believe that her situation has undoubtedly worsened in the interval, given her symptoms of classic resting pain.    I had a lengthy and candid discussion regarding anatomy, pathology/pathophysiology, natural history, and prognosis of critical limb threatening ischemia.  Specifically, I let them know that her symptoms will not be expected to get better, and in fact there is a good chance that she will progress fairly  rapidly within 6 months to a non-healing wound situation or worse gangrene.  This will be almost certain should she injure the foot or toe.    Informed consent regarding treatment strategies of PAD and CLTI was offered which includes medical management for baseline therapy, and then -- beyond that -- surgical strategy, and/or endovascular options, with risk/benefit discussion.  The indications for treatment supported by updated guidelines1, 2 were discussed.  Regarding medical management, maximal medical therapy for reduction of risk factors is indicated as recommended by updated AHA guidelines1.  This includes anti-platelet medication, tight blood glucose control to a HbA1c < 7, tight blood pressure control, maximum-dose HMG-CoA reductase inhibitor, and smoking cessation.    I did let them know that at this point, everything is about risk benefit, and that there is a  risk of continued conservative measures, which includes the very real "risk" of near-future amputation, versus the risks of endovascular therapy to attempt improving healing capability and relieve symptoms.   After our discussion, [name] Torgeson and her daughter would like to consider endovascular therapy.    Regarding endovascular options, specific risks discussed include: bleeding, infection, contrast reaction, renal injury/nephropathy, arterial injury/dissection, need for additional procedure/surgery, worsening symptoms/tissue including limb loss, cardiopulmonary collapse, death.    I let them know in order to plan appropriately, we will need to order CTA and a repeat ABI segmental.  They are going to discuss at home, and anticipate moving forward with angiogram and possible intevention  Plan: - We will update ABI and segmental exam - CTA run-off for pre-operative planning. - We will fill her and her daughter in on the results of above once complete, with tentative future plan to proceed with aorto-peripheral angiogram and possible intervention. - continue maximal medical therapy for cardiovascular risk reduction, including anti-platelet therapy.    ___________________________________________________________________   1Morley Kos MD, et al. 2016 AHA/ACC Guideline on the Management of Patients With Lower Extremity Peripheral Artery Disease: Executive Summary: A Report of the American College of Cardiology/American Heart Association Task Force on Clinical Practice Guidelines. J Am Coll Cardiol. 2017 Mar 21;69(11):1465-1508. doi: 10.1016/j.jacc.2016.11.008.   2 - Norgren L, et al. TASC II Working Group. Inter-society consensus for the management of peripheral arterial disease. Int Tressia Miners. 2007 Jun;26(2):81-157. Review. PubMed PMID: 03009233  3 - Hingorani A, et al. The management of diabetic foot: A clinical practice guideline by the Society for Vascular Surgery in collaboration with the American  Podiatric Medical Association and the Society  for Vascular Medicine. J Vasc Surg. 2016 Feb;63(2 Suppl):3S-21S. doi: 10.1016/j.jvs.2015.10.003. PubMed PMID: 42353614.  4 - Corinna Gab, Saab FA, Luberta Mutter, Grant Ruts, Ewell Poe, Driver VR, [place], Lookstein R, van den Baldemar Lenis, Jaff MR, Guadalupe Dawn, Henao S, AlMahameed A, Katzen B. Digital Subtraction Angiography Prior to an Amputation for Critical Limb Ischemia (CLI): An Expert Recommendation Statement From the CLI Global Society to Optimize Limb Salvage. J Endovasc Ther. 2020 Aug;27(4):540-546. doi: 10.1177/1526602820928590. Epub 2020 May 29. PMID: 43154008.    Thank you for this interesting consult.  I greatly enjoyed meeting IRIEL NASON and look forward to participating in their care.  A copy of this report was sent to the requesting provider on this date.  Electronically Signed: Corrie Mckusick 03/12/2022, 1:56 PM   I spent a total of  60 Minutes   in face to face in clinical consultation, greater than 50% of which was counseling/coordinating care for left leg pain, Rutherford 4 category symptoms of CLTI, possible angiogram and intervention

## 2022-03-17 ENCOUNTER — Other Ambulatory Visit: Payer: 59

## 2022-03-17 ENCOUNTER — Ambulatory Visit
Admission: RE | Admit: 2022-03-17 | Discharge: 2022-03-17 | Disposition: A | Discharge status: Home / Self Care | Payer: 59 | Source: Ambulatory Visit | Attending: Interventional Radiology | Admitting: Interventional Radiology

## 2022-03-17 DIAGNOSIS — I739 Peripheral vascular disease, unspecified: Secondary | ICD-10-CM

## 2022-03-17 DIAGNOSIS — K5730 Diverticulosis of large intestine without perforation or abscess without bleeding: Secondary | ICD-10-CM | POA: Diagnosis not present

## 2022-03-17 DIAGNOSIS — K449 Diaphragmatic hernia without obstruction or gangrene: Secondary | ICD-10-CM | POA: Diagnosis not present

## 2022-03-17 DIAGNOSIS — I745 Embolism and thrombosis of iliac artery: Secondary | ICD-10-CM | POA: Diagnosis not present

## 2022-03-17 MED ORDER — IOPAMIDOL (ISOVUE-370) INJECTION 76%
100.0000 mL | Freq: Once | INTRAVENOUS | Status: AC | PRN
  Administered 2022-03-17: 100 mL via INTRAVENOUS

## 2022-03-18 ENCOUNTER — Ambulatory Visit (INDEPENDENT_AMBULATORY_CARE_PROVIDER_SITE_OTHER): Payer: 59 | Admitting: Podiatry

## 2022-03-18 DIAGNOSIS — M79675 Pain in left toe(s): Secondary | ICD-10-CM | POA: Diagnosis not present

## 2022-03-18 DIAGNOSIS — B351 Tinea unguium: Secondary | ICD-10-CM | POA: Diagnosis not present

## 2022-03-18 DIAGNOSIS — Z794 Long term (current) use of insulin: Secondary | ICD-10-CM

## 2022-03-18 DIAGNOSIS — E1151 Type 2 diabetes mellitus with diabetic peripheral angiopathy without gangrene: Secondary | ICD-10-CM

## 2022-03-18 DIAGNOSIS — M79674 Pain in right toe(s): Secondary | ICD-10-CM | POA: Diagnosis not present

## 2022-03-18 NOTE — Progress Notes (Signed)
  Subjective:  Patient ID: [name], female    DOB: 06/29/1933,  MRN: [id number]  Chief Complaint  Patient presents with   Diabetes    Diabetic foot care, PAD  left leg and foot    89 y.o. female returns for the above complaint.  Patient presents with thickened elongated dystrophic toenails x 10.  She states that is been bothering her for a while she is not able to do it herself she would like for me to do it.  She has a history of PAD and diabetes  Objective:  There were no vitals filed for this visit. Podiatric Exam: Vascular: dorsalis pedis and posterior tibial pulses are faintly palpable bilateral. Capillary return is sluggish temperature gradient is WNL. Skin turgor WNL  Sensorium: Normal Semmes Weinstein monofilament test. Normal tactile sensation bilaterally. Nail Exam: Pt has thick disfigured discolored nails with subungual debris noted bilateral entire nail hallux through fifth toenails.  Pain on palpation to the nails. Ulcer Exam: There is no evidence of ulcer or pre-ulcerative changes or infection. Orthopedic Exam: Muscle tone and strength are WNL. No limitations in general ROM. No crepitus or effusions noted.  Skin: No Porokeratosis. No infection or ulcers    Assessment & Plan:   1. Pain due to onychomycosis of toenails of both feet   2. Type 2 diabetes mellitus with diabetic peripheral angiopathy without gangrene, with long-term current use of insulin ([place])     Patient was evaluated and treated and all questions answered.  Onychomycosis with pain  -Nails palliatively debrided as below. -Educated on self-care  Procedure: Nail Debridement Rationale: pain  Type of Debridement: manual, sharp debridement. Instrumentation: Nail nipper, rotary burr. Number of Nails: 10  Procedures and Treatment: Consent by patient was obtained for treatment procedures. The patient understood the discussion of treatment and procedures well. All questions were answered thoroughly  reviewed. Debridement of mycotic and hypertrophic toenails, 1 through 5 bilateral and clearing of subungual debris. No ulceration, no infection noted.  Return Visit-Office Procedure: Patient instructed to return to the office for a follow up visit 3 months for continued evaluation and treatment.  Boneta Lucks, DPM    Return in about 3 months (around 06/16/2022) for Routine Foot Care.

## 2022-03-20 NOTE — Progress Notes (Signed)
Internal Medicine Clinic Attending  I saw and evaluated the patient.  I personally confirmed the key portions of the history and exam documented by Dr. Jinwala and I reviewed pertinent patient test results.  The assessment, diagnosis, and plan were formulated together and I agree with the documentation in the resident's note.  

## 2022-03-24 ENCOUNTER — Ambulatory Visit
Admission: RE | Admit: 2022-03-24 | Discharge: 2022-03-24 | Disposition: A | Discharge status: Home / Self Care | Payer: 59 | Source: Ambulatory Visit | Attending: Interventional Radiology | Admitting: Interventional Radiology

## 2022-03-24 DIAGNOSIS — I743 Embolism and thrombosis of arteries of the lower extremities: Secondary | ICD-10-CM | POA: Diagnosis not present

## 2022-03-24 DIAGNOSIS — I739 Peripheral vascular disease, unspecified: Secondary | ICD-10-CM

## 2022-03-24 DIAGNOSIS — M79672 Pain in left foot: Secondary | ICD-10-CM | POA: Diagnosis not present

## 2022-03-24 HISTORY — PX: IR RADIOLOGIST EVAL & MGMT: IMG5224

## 2022-03-24 NOTE — Progress Notes (Signed)
CChief Complaint: Left foot pain   Referring Physician(s): Dr. Riesa Pope   Podiatry: Dr. Boneta Lucks  History of Present Illness: [name] is a 89 y.o. female presenting with her family today to [facility] as a follow up after recent consultation 03/12/22.  They are here to discuss her diagnosis of CLTI/CLI and interval diagnostics.   [name] joins [place] today again with her daughter, who is her POA, and her son-in-law/family.     History:  Apparently [name] was ambulating normally and completing all of her ADL's comfortably until only a few months ago, in the November-December time frame, when she began to have some redness in the left forefoot.  This was associated with pain, inferred not by her complaint of pain, but instead by unwillingness to bear weight and ambulate.  She has never complained of any pain or wound in the past.  I cannot elicit any history of claudication in the past.     I am able to speak loudly enough to ask [name] about the pain in the left foot.  She confirms that it is quite uncomfortable, and that in fact it is worse at night.  This wakes her from sleep and she confirms that she hangs over the side of the bed to give some relief.  She does not ambulate at  night because this is more uncomfortable.   She was treated about 2006 for a stroke.  No history of MI.  She has positive history for CHF.  Her daughter states she has not been hospitalized in some time.     CV risk factors: tobacco hx (now quit), DM II, HLD, HTN.      Prior non-invasive exam 2019: Right ABI:  0.46.  Great toe pressure 26, indicating poor wound healing capability Left ABI: 0.51.  Great toe pressure 47.  Borderline wound healing capability.  Interval History:  She has had no significant interval changes since our last visit.  She continues to have resting pain of the left foot.    In the interval, we updated her non-invasive imaging, with segmental exam performed  today.  Right ABI: 0.57 Left ABI: non-calculable.  Monophasic waveforms of the entire left LE.   CTA 03/17/22:  Multi-level bilateral PAD. On the left, there is left EIA occlusion, left SFA occlusion, with tibial disease On the right, she has SFA occlusion, with tibial artery disease.   Past Medical History:  Diagnosis Date   Adenocarcinoma of lung ([place])     Right upper lobe adenocarcinoma. s/p right lower lobectomy 12/15/10   ARTHRITIS, KNEE 03/24/2006   CHF (congestive heart failure) ([place])    CVA (cerebrovascular accident) ([place]) 4259   right embolic stroke, no residual deficits   Depression 12/02/2010   DIABETES MELLITUS, TYPE II 2000   Diverticulitis    GERD 12/04/2005   Hemangioma of liver 12/02/2010   History of meningioma of the brain 07/21/2010   Per MRI 10/2010 stable.    History of pulmonary embolism 04/06/2013   HYPERLIPIDEMIA 12/04/2005   HYPERTENSION 12/04/2005   Meningioma ([place]) 07/21/2010   Right upper lobe, Adenocarcinoma of lung    11/09 - BAL reactive findings no malignancy. 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 Last him him 03/2008 when she fell out of care.  Last imagining 2/10 - stable to minimal increase in RUL mass. CT chest  11/12/10 increasing in size PET scan 11/18/10 Interval increase in size and metabolic activity    Rotator cuff arthropathy 05/19/2021    Past Surgical History:  Procedure Laterality Date   ABDOMINAL HYSTERECTOMY     CRANIOTOMY N/A 03/02/2013   Procedure: CRANIOTOMY TUMOR EXCISION;  Surgeon: Winfield Cunas, MD;  Location: [facility];  Service: Neurosurgery;  Laterality: N/A;  Bifrontal Craniotomy for tumor   Extracapsular cataract extraction with intraocular     lens implantation.   INCISION AND DRAINAGE PERIRECTAL ABSCESS N/A 01/19/2013   Procedure: IRRIGATION AND DEBRIDEMENT PERIRECTAL ABSCESS;  Surgeon: Zenovia Jarred, MD;   Location: [facility];  Service: General;  Laterality: N/A;   IR RADIOLOGIST EVAL & MGMT  03/12/2022   RIGHT VATS,RIGHT THORACOTOMY,RIGHT LOWER LOBECTOMY WITH NODE DISSECTION     Video bronchoscope.  12/29/2007   [place]   Wide excision of left upper back mass.      Allergies: Patient has no known allergies.  Medications: Prior to Admission medications   Medication Sig Start Date End Date Taking? Authorizing Provider  Accu-Chek FastClix Lancets MISC CHECK BLOOD SUGAR THREE TIMES A DAY. DIAG CODE E11.9. INSULIN DEPENDENT 10/23/20   Virl Axe, MD  acetaminophen-codeine (TYLENOL #3) 300-30 MG tablet TAKE 1 TABLET BY MOUTH DAILY AS NEEDED FOR MODERATE PAIN OR SEVERE PAIN. 11/27/21   Virl Axe, MD  aspirin EC 81 MG tablet Take 1 tablet (81 mg total) by mouth daily. 02/18/21   Mitzi Hansen, MD  barrier cream (NON-SPECIFIED) CREA Apply 1 application topically 2 (two) times daily as needed (place on vulva twice daily as needed for redness and swelling). 10/23/20   Virl Axe, MD  Blood Glucose Monitoring Suppl (ACCU-CHEK GUIDE) w/Device KIT 1 each by Does not apply route 3 (three) times daily. 10/23/20   Virl Axe, MD  carvedilol (COREG) 6.25 MG tablet TAKE 1 TABLET BY MOUTH 2 TIMES DAILY WITH A MEAL. 02/24/22   Virl Axe, MD  cilostazol (PLETAL) 100 MG tablet TAKE 1 TABLET BY MOUTH TWICE A DAY 02/24/22   Virl Axe, MD  fluconazole (DIFLUCAN) 200 MG tablet Take 1 tablet (200 mg total) by mouth once a week. 03/05/22   Virl Axe, MD  FLUoxetine (PROZAC) 10 MG capsule TAKE 1 CAPSULE BY MOUTH EVERY DAY 02/24/22   Virl Axe, MD  glucose blood (ACCU-CHEK GUIDE) test strip Check blood sugar 3 times per day 10/23/20   Virl Axe, MD  Incontinence Supplies MISC For use to address bowel and bladder incontinence. 07/04/15   Liberty Handy, MD  Insulin Pen Needle (BD PEN NEEDLE NANO U/F) 32G X 4 MM MISC USE TO ADMINISTER SOLIQUA ONE TIME A DAY 01/01/21   Virl Axe, MD  Insulin Pen  Needle 32G X 4 MM MISC Use to administer soliqua one time a day 12/23/20   Virl Axe, MD  levETIRAcetam (KEPPRA) 750 MG tablet TAKE 1 TABLET BY MOUTH 2 TIMES DAILY. 02/24/22   Virl Axe, MD  losartan (COZAAR) 25 MG tablet TAKE 1 TABLET (25 MG TOTAL) BY MOUTH DAILY. 02/24/22   Virl Axe, MD  omeprazole (PRILOSEC) 20 MG capsule Take 1 capsule (20 mg total) by mouth daily. 02/23/22   Riesa Pope, MD  rosuvastatin (CRESTOR) 20 MG tablet TAKE 1 TABLET BY MOUTH EVERY DAY 02/24/22   Virl Axe, MD     Family History  Problem Relation Age of Onset   Hyperlipidemia Brother    Hypertension Brother    Diabetes Brother     Social  History   Socioeconomic History   Marital status: Widowed    Spouse name: Not on file   Number of children: 5   Years of education: 10   Highest education level: Not on file  Occupational History    Employer: UNEMPLOYED  Tobacco Use   Smoking status: Former    Types: Cigarettes    Quit date: 02/17/2000    Years since quitting: 22.1   Smokeless tobacco: Never  Substance and Sexual Activity   Alcohol use: No    Alcohol/week: 0.0 standard drinks of alcohol   Drug use: No   Sexual activity: Not Currently  Other Topics Concern   Not on file  Social History Narrative   Patient requests to use [employer] for her diabetes testing supplies as of 09/05/2009.   Lives at home with daughter, [name].   Caffeine use: 1 cup coffee/day   Social Determinants of Health   Financial Resource Strain: High Risk (11/26/2021)   Overall Financial Resource Strain (CARDIA)    Difficulty of Paying Living Expenses: Very hard  Food Insecurity: No Food Insecurity (02/23/2022)   Hunger Vital Sign    Worried About Running Out of Food in the Last Year: Never true    [place] in the Last Year: Never true  Recent Concern: Food Insecurity - Food Insecurity Present (11/27/2021)   Hunger Vital Sign    Worried About Running Out of Food in the Last  Year: Sometimes true    Ran Out of Food in the Last Year: Sometimes true  Transportation Needs: No Transportation Needs (02/23/2022)   PRAPARE - Hydrologist (Medical): No    Lack of Transportation (Non-Medical): No  Recent Concern: Transportation Needs - Unmet Transportation Needs (11/27/2021)   PRAPARE - Hydrologist (Medical): Yes    Lack of Transportation (Non-Medical): No  Physical Activity: Not on file  Stress: Stress Concern Present (11/27/2021)   [place]    Feeling of Stress : To some extent  Social Connections: Socially Isolated (02/23/2022)   Social Connection and Isolation Panel [NHANES]    Frequency of Communication with Friends and Family: Once a week    Frequency of Social Gatherings with Friends and Family: Once a week    Attends Religious Services: Never    Marine scientist or Organizations: No    Attends Archivist Meetings: Never    Marital Status: Widowed      Review of Systems: A 12 point ROS discussed and pertinent positives are indicated in the HPI above.  All other systems are negative.  Review of Systems  Vital Signs: There were no vitals taken for this visit.  Advance Care Plan: The advanced care plan/surrogate decision maker was discussed at the time of visit and documented in the medical record.    Physical Exam Targeted exam: Left foot exam: TTP. trophic changes with rubor of the forefoot, dry, scaling skin. Non palpable pulses.   Mallampati Score:     Imaging: US ARTERIAL SEG MULTIPLE LE (ABI, SEGMENTAL PRESSURES, PVR'S)  Result Date: 03/24/2022 CLINICAL DATA:  89 year old female with a history of critical limb ischemia, left foot resting pain and wound EXAM: NONINVASIVE PHYSIOLOGIC VASCULAR STUDY OF BILATERAL LOWER EXTREMITIES TECHNIQUE: Evaluation of both lower extremities was performed at rest, including  calculation of ankle-brachial indices, multiple segmental pressure evaluation, segmental Doppler and segmental pulse volume recording. COMPARISON:  None Available. FINDINGS: Right: Resting ankle brachial index:  0.57 Segmental blood pressure: Upper extremity pressures are symmetric. Appropriate increased to the right thigh. Significant drop in the right low thigh and calf Doppler: Segmental Doppler demonstrates monophasic waveforms of the right lower extremity. Pulse volume recording: Segmental PVR demonstrates decreased amplitude and quality. Left: Resting ankle brachial index: Non calculable Segmental blood pressure: Upper extremity pressures are symmetric. No pressures obtained in the left lower extremity. Doppler: Segmental Doppler demonstrates abnormal monophasic waveforms throughout. Pulse volume recording: Segmental PVR demonstrates artifact. Additional: IMPRESSION: Right: Resting ABI in the moderate range arterial occlusive disease. Segmental exam demonstrates evidence of significant femoropopliteal disease and likely iliac disease. Left: Resting ABI non calculable. Segmental exam demonstrates advanced multi segment disease of the iliac femoropopliteal and tibial segments. Signed, Dulcy Fanny. Nadene Rubins, RPVI [facility] Interventional Radiology Specialists [facility] Radiology Electronically Signed   By: Corrie Mckusick D.O.   On: 03/24/2022 14:39   CT ANGIO AO+BIFEM W & OR WO CONTRAST  Result Date: 03/18/2022 CLINICAL DATA:  89 year old female with left leg pain EXAM: CT ANGIOGRAPHY OF ABDOMINAL AORTA WITH ILIOFEMORAL RUNOFF TECHNIQUE: Multidetector CT imaging of the abdomen, pelvis and lower extremities was performed using the standard protocol during bolus administration of intravenous contrast. Multiplanar CT image reconstructions and MIPs were obtained to evaluate the vascular anatomy. RADIATION DOSE REDUCTION: This exam was performed according to the departmental dose-optimization program which  includes automated exposure control, adjustment of the mA and/or kV according to patient size and/or use of iterative reconstruction technique. CONTRAST:  159mL ISOVUE-370 IOPAMIDOL (ISOVUE-370) INJECTION 76% COMPARISON:  Abdominal CT 10/08/2014 FINDINGS: VASCULAR Aorta: Atherosclerotic changes of the lower thoracic and the abdominal aorta. No pedunculated plaque or ulcerated plaque. No dissection. No periaortic inflammatory changes. Celiac: Patent, with no significant atherosclerotic changes. SMA: Patent with mild atherosclerotic changes.  Branches patent. Renals: - Right: Single right renal artery with mild atherosclerosis at the origin. - Left: Single left renal artery with mild atherosclerosis at the origin. IMA: Inferior mesenteric artery is patent. Right lower extremity: Mild to moderate atherosclerotic changes of the right common iliac artery without high-grade stenosis or occlusion. Hypogastric artery patent with mild atherosclerosis. Pelvic arteries patent. External iliac artery of relatively small caliber with moderate atherosclerotic changes. No high-grade stenosis or occlusion. Atherosclerotic changes of the common femoral artery with predominantly posterior wall calcifications and no high-grade stenosis. Profunda femoris and thigh branches are patent. Advanced atherosclerotic changes of the right superficial femoral artery which is occluded just after the origin. Reconstitution of the popliteal artery via collateral flow, small caliber. Typical appearance of the trifurcation. Anterior tibial artery is patent from the origin to the ankle. Tibioperoneal trunk patent. Peroneal artery patent from the origin to the ankle. Posterior tibial artery appears occluded after the origin. Left lower extremity: Moderate atherosclerotic changes of the common iliac artery with mixed calcified and soft plaque. No high-grade stenosis or occlusion of the common iliac artery. Hypogastric artery remains patent.  With patency  of pelvic arteries. Occlusion of the left external iliac artery after the origin. Reconstitution via collateral flow from the circumflex iliac artery and the inferior epigastric artery. Common femoral artery patent with mild atherosclerotic changes. Profunda femoris and the thigh branches are patent. Superficial femoral artery demonstrates advanced atherosclerotic changes, with short segment of patent see proximally and occlusion in the proximal third. There is reconstitution of the popliteal artery via collateral flow with moderate atherosclerotic changes. Typical appearance of the trifurcation. Anterior tibial artery  is patent proximally and appears occluded distally secondary to some calcified plaque. Tibioperoneal trunk is patent. Peroneal artery is patent from the origin to the ankle. Posterior tibial artery appears occluded throughout its length secondary to calcified plaque. Veins: Unremarkable appearance of the venous system. Review of the MIP images confirms the above findings. NON-VASCULAR Lower chest: Bronchial wall thickening in the bronchi of the right infrahilar region with some mixed interstitial and airspace disease of the right middle lobe. No pneumothorax or pleural effusion. Hepatobiliary: Redemonstration rounded lesion within the right liver with discontinuous peripheral enhancement, compatible with hepatic hemangioma. Diameter estimated 7.6 cm which is only slightly larger than the prior 6.2 cm. Otherwise unremarkable liver. Unremarkable gall bladder. Pancreas: Unremarkable. Spleen: Unremarkable. Adrenals/Urinary Tract: - Right adrenal gland: Unremarkable - Left adrenal gland: Unremarkable. - Right kidney: No hydronephrosis. No nephrolithiasis. Progressive renal cortical thinning with some more focal areas of renal thinning, potentially from prior infection/infarction. No focal lesion. - Left Kidney: No hydronephrosis. No nephrolithiasis. General renal cortical thinning with some more focal  regions of thinning, potentially from prior infection/infarction. No focal lesion. - Urinary Bladder: Some gas within the urinary bladder, presumably secondary to instrumentation. Stomach/Bowel: - Stomach: Hiatal hernia.  Otherwise unremarkable stomach. - Small bowel: Unremarkable - Appendix: Appendix is not visualized, however, no inflammatory changes are present adjacent to the cecum to indicate an appendicitis. - Colon: No inflammatory changes of the colon. Left-sided colonic diverticular disease. No evidence of bowel obstruction Lymphatic: No adenopathy. Mesenteric: Overall there is decreased number and size of multiple mesenteric lymph nodes. Small lymph nodes/nodule measuring 8 mm within the lower abdomen, anterior to the soft tissue mass that was previously identified in the presacral space. The soft tissue in the presacral region demonstrates interval enlargement, previously 5.6 cm x 6.3 cm, currently 7.3 cm x 7.9 cm. Relatively uniform enhancement, with low Hounsfield units on the arterial phase. No inflammatory changes. This is intimately associated with the right adnexa Reproductive: Hysterectomy Other: Nonspecific pelvic floor laxity Musculoskeletal: Degenerative changes of the spine. No bony canal narrowing. No displaced fracture. Degenerative changes of the hips. Degenerative changes of the knees. Surgical changes of the right ankle. Intramuscular lipoma of the proximal right thigh. IMPRESSION: Multilevel peripheral arterial disease, including: -aortic atherosclerosis.  Aortic Atherosclerosis (ICD10-I70.0). -mild to moderate right-sided iliac disease without high-grade stenosis or occlusion -advanced right-sided femoropopliteal disease, with long segment occlusion of the SFA and reconstitution of the popliteal artery -moderate right-sided tibial arterial disease, with what appears to be patency of both anterior tibial artery and peroneal artery to the ankle -advanced left-sided iliac disease including  occlusion of the left external iliac artery -advanced left-sided femoropopliteal disease including occlusion of the SFA, reconstitution of the popliteal artery -moderate to advanced left-sided tibial disease, with what appears to be single patent peroneal artery to the ankle **An incidental finding of potential clinical significance has been found. Increasing size of presacral soft tissue mass which appears to be associated with the right adnexa/ovary. A low grade gynecologic malignancy cannot be excluded. Referral for gynecologic oncology may be considered.** Inflammatory changes partially imaged in the right chest. Correlation with patient symptoms/history may be useful. Additional ancillary additional ancillary findings as above Signed, Dulcy Fanny. Nadene Rubins, RPVI Vascular and Interventional Radiology Specialists [facility] Radiology Electronically Signed   By: Corrie Mckusick D.O.   On: 03/18/2022 09:13   IR Radiologist Eval & Mgmt  Result Date: 03/12/2022 EXAM: NEW PATIENT OFFICE VISIT CHIEF COMPLAINT: Electronic medical record HISTORY OF  PRESENT ILLNESS: Electronic medical record REVIEW OF SYSTEMS: Electronic medical record PHYSICAL EXAMINATION: Electronic medical record ASSESSMENT AND PLAN: Electronic medical record Electronically Signed   By: Corrie Mckusick D.O.   On: 03/12/2022 16:08    Labs:  CBC: No results for input(s): "WBC", "HGB", "HCT", "PLT" in the last 8760 hours.  COAGS: No results for input(s): "INR", "APTT" in the last 8760 hours.  BMP: Recent Labs    02/23/22 1441  NA 138  K 3.7  CL 101  CO2 19*  GLUCOSE 220*  BUN 21  CALCIUM 8.5*  CREATININE 1.15*    LIVER FUNCTION TESTS: No results for input(s): "BILITOT", "AST", "ALT", "ALKPHOS", "PROT", "ALBUMIN" in the last 8760 hours.  TUMOR MARKERS: No results for input(s): "AFPTM", "CEA", "CA199", "CHROMGRNA" in the last 8760 hours.  Assessment and Plan:  Assessment:  [name] is a 89 yo female  presenting  with rutherford 4 category symptoms of CLTI/CLI of the left foot.     Previous non-invasive ABI of 2019 shows right ABI of 0.46, and left ABI of 0.51.  She has progressed since 2019, now with ABI showing non-calculable left ABI, and monophasic waveforms throughout.  She has CTA showing multi-level left iliac and femoral disease. Marland Kitchen     Her situation has undoubtedly worsened in the interval, given her symptoms of classic resting pain.     Today I had another conversation with [name] and her family regarding her imaging and non-invasive diagnostics, and the natural history and prognosis of critical limb threatening ischemia.  Specifically, I let them know that her symptoms will not be expected to get better, and in fact there is a good chance that she will progress fairly  rapidly within the next few weeks to months to a limb-threat/non-viable situation. .     We again discussed that everything is about risk benefit, and that there is a risk of continued conservative measures, which includes the very real "risk" of near-future amputation, versus the risks of endovascular therapy to attempt improving healing capability and relieve symptoms.     After our discussion, [name] and her daughter, brother, and family would like to consider endovascular therapy.    Regarding endovascular options, specific risks discussed include: bleeding, infection, contrast reaction, renal injury/nephropathy, arterial injury/dissection, need for additional procedure/surgery, worsening symptoms/tissue including limb loss, cardiopulmonary collapse, death.     They would like to proceed with attempt at revascularization.    Plan: - Plan for angiogram and possible intervention targeting the left lower extremity, with Dr. Earleen Newport at [facility], first available.   - continue maximal medical therapy for cardiovascular risk reduction, including anti-platelet therapy.  Electronically Signed: Corrie Mckusick 03/24/2022, 3:47 PM   I spent a  total of    25 Minutes in face to face in clinical consultation, greater than 50% of which was counseling/coordinating care for PAD/CLTI, possible angiogram and intervention, left lower extremity

## 2022-03-25 ENCOUNTER — Other Ambulatory Visit (HOSPITAL_COMMUNITY): Payer: Self-pay | Admitting: Interventional Radiology

## 2022-03-25 DIAGNOSIS — I739 Peripheral vascular disease, unspecified: Secondary | ICD-10-CM

## 2022-03-26 ENCOUNTER — Encounter: Payer: Medicare Other | Admitting: Student

## 2022-03-26 ENCOUNTER — Encounter: Payer: Self-pay | Admitting: Podiatry

## 2022-04-01 ENCOUNTER — Other Ambulatory Visit (HOSPITAL_COMMUNITY): Payer: Self-pay | Admitting: Physician Assistant

## 2022-04-01 ENCOUNTER — Other Ambulatory Visit: Payer: Self-pay | Admitting: Radiology

## 2022-04-01 DIAGNOSIS — I998 Other disorder of circulatory system: Secondary | ICD-10-CM

## 2022-04-02 ENCOUNTER — Other Ambulatory Visit (HOSPITAL_COMMUNITY): Payer: Self-pay | Admitting: Interventional Radiology

## 2022-04-02 ENCOUNTER — Other Ambulatory Visit: Payer: Self-pay | Admitting: Physician Assistant

## 2022-04-02 ENCOUNTER — Ambulatory Visit (HOSPITAL_COMMUNITY)
Admission: RE | Admit: 2022-04-02 | Discharge: 2022-04-02 | Disposition: A | Discharge status: Home / Self Care | Payer: 59 | Source: Ambulatory Visit | Attending: Interventional Radiology | Admitting: Interventional Radiology

## 2022-04-02 ENCOUNTER — Other Ambulatory Visit: Payer: Self-pay

## 2022-04-02 ENCOUNTER — Encounter (HOSPITAL_COMMUNITY): Payer: Self-pay

## 2022-04-02 DIAGNOSIS — I998 Other disorder of circulatory system: Secondary | ICD-10-CM

## 2022-04-02 DIAGNOSIS — I110 Hypertensive heart disease with heart failure: Secondary | ICD-10-CM | POA: Insufficient documentation

## 2022-04-02 DIAGNOSIS — Z8673 Personal history of transient ischemic attack (TIA), and cerebral infarction without residual deficits: Secondary | ICD-10-CM | POA: Diagnosis not present

## 2022-04-02 DIAGNOSIS — E785 Hyperlipidemia, unspecified: Secondary | ICD-10-CM | POA: Insufficient documentation

## 2022-04-02 DIAGNOSIS — Z85118 Personal history of other malignant neoplasm of bronchus and lung: Secondary | ICD-10-CM | POA: Diagnosis not present

## 2022-04-02 DIAGNOSIS — I739 Peripheral vascular disease, unspecified: Secondary | ICD-10-CM

## 2022-04-02 DIAGNOSIS — Z794 Long term (current) use of insulin: Secondary | ICD-10-CM | POA: Diagnosis not present

## 2022-04-02 DIAGNOSIS — I509 Heart failure, unspecified: Secondary | ICD-10-CM | POA: Insufficient documentation

## 2022-04-02 DIAGNOSIS — Z87891 Personal history of nicotine dependence: Secondary | ICD-10-CM | POA: Insufficient documentation

## 2022-04-02 DIAGNOSIS — E1151 Type 2 diabetes mellitus with diabetic peripheral angiopathy without gangrene: Secondary | ICD-10-CM | POA: Diagnosis not present

## 2022-04-02 DIAGNOSIS — Z7982 Long term (current) use of aspirin: Secondary | ICD-10-CM | POA: Insufficient documentation

## 2022-04-02 DIAGNOSIS — F32A Depression, unspecified: Secondary | ICD-10-CM | POA: Diagnosis not present

## 2022-04-02 DIAGNOSIS — I70222 Atherosclerosis of native arteries of extremities with rest pain, left leg: Secondary | ICD-10-CM | POA: Insufficient documentation

## 2022-04-02 DIAGNOSIS — I70202 Unspecified atherosclerosis of native arteries of extremities, left leg: Secondary | ICD-10-CM | POA: Diagnosis not present

## 2022-04-02 HISTORY — PX: IR AORTAGRAM ABDOMINAL SERIALOGRAM: IMG636

## 2022-04-02 HISTORY — PX: IR US GUIDE VASC ACCESS RIGHT: IMG2390

## 2022-04-02 HISTORY — PX: IR FEM POP ART STENT INC PTA MOD SED: IMG2311

## 2022-04-02 HISTORY — PX: IR US GUIDE VASC ACCESS LEFT: IMG2389

## 2022-04-02 HISTORY — PX: IR ANGIOGRAM EXTREMITY LEFT: IMG651

## 2022-04-02 HISTORY — PX: IR ILIAC ART STENT INC PTA MOD SED: IMG2306

## 2022-04-02 HISTORY — PX: IR ANGIOGRAM SELECTIVE EACH ADDITIONAL VESSEL: IMG667

## 2022-04-02 LAB — CBC
HCT: 36.4 % (ref 36.0–46.0)
Hemoglobin: 11.3 g/dL — ABNORMAL LOW (ref 12.0–15.0)
MCH: 25.6 pg — ABNORMAL LOW (ref 26.0–34.0)
MCHC: 31 g/dL (ref 30.0–36.0)
MCV: 82.4 fL (ref 80.0–100.0)
Platelets: 317 10*3/uL (ref 150–400)
RBC: 4.42 MIL/uL (ref 3.87–5.11)
RDW: 15.5 % (ref 11.5–15.5)
WBC: 6 10*3/uL (ref 4.0–10.5)
nRBC: 0 % (ref 0.0–0.2)

## 2022-04-02 LAB — BASIC METABOLIC PANEL
Anion gap: 10 (ref 5–15)
BUN: 11 mg/dL (ref 8–23)
CO2: 28 mmol/L (ref 22–32)
Calcium: 8.4 mg/dL — ABNORMAL LOW (ref 8.9–10.3)
Chloride: 102 mmol/L (ref 98–111)
Creatinine, Ser: 0.97 mg/dL (ref 0.44–1.00)
GFR, Estimated: 57 mL/min — ABNORMAL LOW (ref >60)
Glucose, Bld: 248 mg/dL — ABNORMAL HIGH (ref 70–99)
Potassium: 3 mmol/L — ABNORMAL LOW (ref 3.5–5.1)
Sodium: 140 mmol/L (ref 135–145)

## 2022-04-02 LAB — GLUCOSE, CAPILLARY
Glucose-Capillary: 248 mg/dL — ABNORMAL HIGH (ref 70–99)
Glucose-Capillary: 173 mg/dL — ABNORMAL HIGH (ref 70–99)

## 2022-04-02 LAB — PROTIME-INR
INR: 1 (ref 0.8–1.2)
Prothrombin Time: 12.8 seconds (ref 11.4–15.2)

## 2022-04-02 MED ORDER — ASPIRIN 325 MG PO TABS
ORAL_TABLET | ORAL | Status: AC
  Administered 2022-04-02: 650 mg via ORAL
  Filled 2022-04-02: qty 2

## 2022-04-02 MED ORDER — HEPARIN SODIUM (PORCINE) 1000 UNIT/ML IJ SOLN
INTRAMUSCULAR | Status: AC | PRN
  Administered 2022-04-02: 3000 [IU] via INTRAVENOUS

## 2022-04-02 MED ORDER — CLOPIDOGREL BISULFATE 75 MG PO TABS: 300.0000 mg | ORAL_TABLET | Freq: Once | ORAL | Status: DC

## 2022-04-02 MED ORDER — ONDANSETRON HCL 4 MG/2ML IJ SOLN
INTRAMUSCULAR | Status: AC | PRN
  Administered 2022-04-02: 4 mg via INTRAVENOUS

## 2022-04-02 MED ORDER — IOHEXOL 300 MG/ML  SOLN
100.0000 mL | Freq: Once | INTRAMUSCULAR | Status: AC | PRN
  Administered 2022-04-02: 16 mL via INTRAVENOUS

## 2022-04-02 MED ORDER — LIDOCAINE HCL 1 % IJ SOLN
INTRAMUSCULAR | Status: AC
  Administered 2022-04-02: 10 mL via INTRADERMAL
  Filled 2022-04-02: qty 20

## 2022-04-02 MED ORDER — FENTANYL CITRATE (PF) 100 MCG/2ML IJ SOLN
INTRAMUSCULAR | Status: AC | PRN
  Administered 2022-04-02 (×2): 25 ug via INTRAVENOUS

## 2022-04-02 MED ORDER — HEPARIN SODIUM (PORCINE) 1000 UNIT/ML IJ SOLN
INTRAMUSCULAR | Status: AC
  Filled 2022-04-02: qty 10

## 2022-04-02 MED ORDER — FENTANYL CITRATE (PF) 100 MCG/2ML IJ SOLN
INTRAMUSCULAR | Status: AC
  Filled 2022-04-02: qty 2

## 2022-04-02 MED ORDER — MIDAZOLAM HCL 2 MG/2ML IJ SOLN
INTRAMUSCULAR | Status: AC | PRN
  Administered 2022-04-02: .5 mg via INTRAVENOUS
  Administered 2022-04-02: 1 mg via INTRAVENOUS

## 2022-04-02 MED ORDER — SODIUM CHLORIDE 0.9 % IV SOLN: INTRAVENOUS | Status: DC

## 2022-04-02 MED ORDER — HYDRALAZINE HCL 20 MG/ML IJ SOLN
INTRAMUSCULAR | Status: AC | PRN
  Administered 2022-04-02: 10 mg via INTRAVENOUS

## 2022-04-02 MED ORDER — ONDANSETRON HCL 4 MG/2ML IJ SOLN
4.0000 mg | Freq: Once | INTRAMUSCULAR | Status: DC
  Filled 2022-04-02: qty 2

## 2022-04-02 MED ORDER — ASCORBIC ACID 500 MG PO TABS
1000.0000 mg | ORAL_TABLET | Freq: Once | ORAL | Status: AC
  Administered 2022-04-02: 1000 mg via ORAL
  Filled 2022-04-02 (×2): qty 2

## 2022-04-02 MED ORDER — ONDANSETRON HCL 4 MG/2ML IJ SOLN
INTRAMUSCULAR | Status: AC
  Filled 2022-04-02: qty 2

## 2022-04-02 MED ORDER — HYDRALAZINE HCL 20 MG/ML IJ SOLN
INTRAMUSCULAR | Status: AC
  Filled 2022-04-02: qty 1

## 2022-04-02 MED ORDER — IOHEXOL 300 MG/ML  SOLN
100.0000 mL | Freq: Once | INTRAMUSCULAR | Status: AC | PRN
  Administered 2022-04-02: 70 mL via INTRA_ARTERIAL

## 2022-04-02 MED ORDER — IOHEXOL 300 MG/ML  SOLN
150.0000 mL | Freq: Once | INTRAMUSCULAR | Status: AC | PRN
  Administered 2022-04-02: 134 mL via INTRA_ARTERIAL

## 2022-04-02 MED ORDER — SODIUM CHLORIDE 0.45 % IV SOLN
INTRAVENOUS | Status: AC
  Filled 2022-04-02: qty 75

## 2022-04-02 MED ORDER — MIDAZOLAM HCL 2 MG/2ML IJ SOLN
INTRAMUSCULAR | Status: AC
  Filled 2022-04-02: qty 2

## 2022-04-02 MED ORDER — CLOPIDOGREL BISULFATE 75 MG PO TABS: 75.0000 mg | ORAL_TABLET | Freq: Every day | ORAL | 3 refills | Status: DC

## 2022-04-02 MED ORDER — HEPARIN SODIUM (PORCINE) 1000 UNIT/ML IJ SOLN
INTRAMUSCULAR | Status: AC | PRN
  Administered 2022-04-02: 8000 [IU] via INTRAVENOUS
  Administered 2022-04-02: 2000 [IU] via INTRAVENOUS

## 2022-04-02 MED ORDER — ASPIRIN 325 MG PO TABS: 650.0000 mg | ORAL_TABLET | Freq: Once | ORAL | Status: AC

## 2022-04-02 NOTE — Sedation Documentation (Signed)
CELT deployed 1339

## 2022-04-02 NOTE — Procedures (Addendum)
Interventional Radiology Procedure Note  Procedure:   US guided access right CFA, retrograde US guided access left AT, retrograde  Pelvic and left LE angio  Treatment of left EIA occlusion, PTA & Stenting Treatment of left SFA occlusion, PTA & Stenting  CELT for closure  EBL: 68TM  Complications: None   Findings:  Restored flow through left EIA and SFA to below knee.  + left AT doppler pulse - left PT doppler  +right AT/PT doppler pulse, monophasic  Recommendations:  - Ok to shower tomorrow - CELT has indication for immediate ambulation - left thigh and foot pain will be expected, given reperfusion and stenting - Anticipate DC in 2 hours when goals met - Continue IV ggt x 2 more hours - Bedpan or ambulate with assist to the bathroom - Advance diet - Do not submerge for 7 days - Continue maximal medical therapy - Routine wound care - Follow up with Dr. Earleen Newport in 3-4 weeks post op   Signed,  Dulcy Fanny. Earleen Newport, DO

## 2022-04-02 NOTE — Sedation Documentation (Signed)
Received 1mg  Versed and 44mcg Fentanyl from [facility]

## 2022-04-02 NOTE — H&P (Signed)
Chief Complaint: Patient was seen in consultation today for left lower extremity claudication  Referring Physician(s): Dr. Riesa Pope  Supervising Physician: Corrie Mckusick  Patient Status: [name] - Out-pt  History of Present Illness: [name] is a 89 y.o. female with past medical history of lung adenocarcinoma s/p lobectomy in 2012, CHF, CVA, depression, DM, HTN, HLD who has experienced working symptoms related to her CLTI/CLI of the left foot.  She was ambulatory prior to November/December of last year when her foot started to become red and painful.  She does follow with a podiatrist and last saw 2 weeks ago.  She has consulted with Dr. Earleen Newport in January and recently saw in follow-up 03/24/22 at which time she and her daughter elected to proceed with angiogram and intervention.   [name] presents to Radiology today in her usual state of health.  She has been NPO.  She does take aspirin regularly.  She and her daughter are aware of the goals of the procedure today and are agreeable to proceed.  Daughter is available for recovery and care at home.  She does rely on medical transport to get her mother to/from appointments and upstairs as needed.   Past Medical History:  Diagnosis Date   Adenocarcinoma of lung ([place])     Right upper lobe adenocarcinoma. s/p right lower lobectomy 12/15/10   ARTHRITIS, KNEE 03/24/2006   CHF (congestive heart failure) ([place])    CVA (cerebrovascular accident) ([place]) 4010   right embolic stroke, no residual deficits   Depression 12/02/2010   DIABETES MELLITUS, TYPE II 2000   Diverticulitis    GERD 12/04/2005   Hemangioma of liver 12/02/2010   History of meningioma of the brain 07/21/2010   Per MRI 10/2010 stable.    History of pulmonary embolism 04/06/2013   HYPERLIPIDEMIA 12/04/2005   HYPERTENSION 12/04/2005   Meningioma ([place]) 07/21/2010   Right upper lobe, Adenocarcinoma of lung    11/09 - BAL reactive findings no malignancy. 11/09 -  PET low level activity in the RUL Dr Arlyce Dice was following with serial CT scans bc of concern this might be bronchoalveolar carcinoma. Last him him 03/2008 when she fell out of care.  Last imagining 2/10 - stable to minimal increase in RUL mass. CT chest 11/12/10 increasing in size PET scan 11/18/10 Interval increase in size and metabolic activity    Rotator cuff arthropathy 05/19/2021    Past Surgical History:  Procedure Laterality Date   ABDOMINAL HYSTERECTOMY     CRANIOTOMY N/A 03/02/2013   Procedure: CRANIOTOMY TUMOR EXCISION;  Surgeon: Winfield Cunas, MD;  Location: [facility];  Service: Neurosurgery;  Laterality: N/A;  Bifrontal Craniotomy for tumor   Extracapsular cataract extraction with intraocular     lens implantation.   INCISION AND DRAINAGE PERIRECTAL ABSCESS N/A 01/19/2013   Procedure: IRRIGATION AND DEBRIDEMENT PERIRECTAL ABSCESS;  Surgeon: Zenovia Jarred, MD;  Location: [facility];  Service: General;  Laterality: N/A;   IR RADIOLOGIST EVAL & MGMT  03/12/2022   IR RADIOLOGIST EVAL & MGMT  03/24/2022   RIGHT VATS,RIGHT THORACOTOMY,RIGHT LOWER LOBECTOMY WITH NODE DISSECTION     Video bronchoscope.  12/29/2007   [place]   Wide excision of left upper back mass.      Allergies: Patient has no known allergies.  Medications: Prior to Admission medications   Medication Sig Start Date End Date Taking? Authorizing Provider  aspirin EC 81 MG tablet Take 1 tablet (81 mg total) by mouth daily. 02/18/21  Yes  Christian, Rylee, MD  carvedilol (COREG) 6.25 MG tablet TAKE 1 TABLET BY MOUTH 2 TIMES DAILY WITH A MEAL. 02/24/22  Yes Virl Axe, MD  cilostazol (PLETAL) 100 MG tablet TAKE 1 TABLET BY MOUTH TWICE A DAY 02/24/22  Yes Virl Axe, MD  fluconazole (DIFLUCAN) 200 MG tablet Take 1 tablet (200 mg total) by mouth once a week. 03/05/22  Yes Virl Axe, MD  FLUoxetine (PROZAC) 10 MG capsule TAKE 1 CAPSULE BY MOUTH EVERY DAY 02/24/22  Yes Virl Axe, MD  levETIRAcetam (KEPPRA) 750 MG tablet  TAKE 1 TABLET BY MOUTH 2 TIMES DAILY. 02/24/22  Yes Virl Axe, MD  losartan (COZAAR) 25 MG tablet TAKE 1 TABLET (25 MG TOTAL) BY MOUTH DAILY. 02/24/22  Yes Virl Axe, MD  omeprazole (PRILOSEC) 20 MG capsule Take 1 capsule (20 mg total) by mouth daily. 02/23/22  Yes Katsadouros, Vasilios, MD  rosuvastatin (CRESTOR) 20 MG tablet TAKE 1 TABLET BY MOUTH EVERY DAY 02/24/22  Yes Virl Axe, MD  Accu-Chek FastClix Lancets MISC CHECK BLOOD SUGAR THREE TIMES A DAY. DIAG CODE E11.9. INSULIN DEPENDENT 10/23/20   Virl Axe, MD  acetaminophen-codeine (TYLENOL #3) 300-30 MG tablet TAKE 1 TABLET BY MOUTH DAILY AS NEEDED FOR MODERATE PAIN OR SEVERE PAIN. 11/27/21   Virl Axe, MD  barrier cream (NON-SPECIFIED) CREA Apply 1 application topically 2 (two) times daily as needed (place on vulva twice daily as needed for redness and swelling). 10/23/20   Virl Axe, MD  Blood Glucose Monitoring Suppl (ACCU-CHEK GUIDE) w/Device KIT 1 each by Does not apply route 3 (three) times daily. 10/23/20   Virl Axe, MD  glucose blood (ACCU-CHEK GUIDE) test strip Check blood sugar 3 times per day 10/23/20   Virl Axe, MD  Incontinence Supplies MISC For use to address bowel and bladder incontinence. 07/04/15   Liberty Handy, MD  Insulin Pen Needle (BD PEN NEEDLE NANO U/F) 32G X 4 MM MISC USE TO ADMINISTER SOLIQUA ONE TIME A DAY 01/01/21   Virl Axe, MD  Insulin Pen Needle 32G X 4 MM MISC Use to administer soliqua one time a day 12/23/20   Virl Axe, MD     Family History  Problem Relation Age of Onset   Hyperlipidemia Brother    Hypertension Brother    Diabetes Brother     Social History   Socioeconomic History   Marital status: Widowed    Spouse name: Not on file   Number of children: 5   Years of education: 10   Highest education level: Not on file  Occupational History    Employer: UNEMPLOYED  Tobacco Use   Smoking status: Former    Types: Cigarettes    Quit date: 02/17/2000    Years  since quitting: 22.1   Smokeless tobacco: Never  Substance and Sexual Activity   Alcohol use: No    Alcohol/week: 0.0 standard drinks of alcohol   Drug use: No   Sexual activity: Not Currently  Other Topics Concern   Not on file  Social History Narrative   Patient requests to use [employer] for her diabetes testing supplies as of 09/05/2009.   Lives at home with daughter, [name].   Caffeine use: 1 cup coffee/day   Social Determinants of Health   Financial Resource Strain: High Risk (11/26/2021)   Overall Financial Resource Strain (CARDIA)    Difficulty of Paying Living Expenses: Very hard  Food Insecurity: No Food Insecurity (02/23/2022)   Hunger Vital Sign    Worried About  Running Out of Food in the Last Year: Never true    Ran Out of Food in the Last Year: Never true  Recent Concern: Food Insecurity - Food Insecurity Present (11/27/2021)   Hunger Vital Sign    Worried About Running Out of Food in the Last Year: Sometimes true    Ran Out of Food in the Last Year: Sometimes true  Transportation Needs: No Transportation Needs (02/23/2022)   PRAPARE - Hydrologist (Medical): No    Lack of Transportation (Non-Medical): No  Recent Concern: Transportation Needs - Unmet Transportation Needs (11/27/2021)   PRAPARE - Hydrologist (Medical): Yes    Lack of Transportation (Non-Medical): No  Physical Activity: Not on file  Stress: Stress Concern Present (11/27/2021)   Yellow Medicine    Feeling of Stress : To some extent  Social Connections: Socially Isolated (02/23/2022)   Social Connection and Isolation Panel [NHANES]    Frequency of Communication with Friends and Family: Once a week    Frequency of Social Gatherings with Friends and Family: Once a week    Attends Religious Services: Never    Marine scientist or Organizations: No    Attends Theatre manager Meetings: Never    Marital Status: Widowed     Review of Systems: A 12 point ROS discussed and pertinent positives are indicated in the HPI above.  All other systems are negative.  Review of Systems  Constitutional:  Negative for fatigue and fever.  Respiratory:  Negative for cough and shortness of breath.   Cardiovascular:  Negative for chest pain.  Gastrointestinal:  Negative for abdominal pain.  Skin:  Positive for color change and wound.  Neurological:  Negative for seizures, weakness, numbness and headaches.  Psychiatric/Behavioral:  Negative for behavioral problems and confusion.     Vital Signs: BP 133/62   Pulse 84   Resp 16   Ht 5\' 4"  (1.626 m)   Wt 202 lb (91.6 kg)   SpO2 97%   BMI 34.67 kg/m   Physical Exam Vitals and nursing note reviewed.  Constitutional:      General: She is not in acute distress.    Appearance: Normal appearance. She is not ill-appearing.  HENT:     Mouth/Throat:     Mouth: Mucous membranes are moist.     Pharynx: Oropharynx is clear.  Cardiovascular:     Rate and Rhythm: Normal rate and regular rhythm.  Pulmonary:     Effort: Pulmonary effort is normal.     Breath sounds: Normal breath sounds.  Abdominal:     General: Abdomen is flat.     Palpations: Abdomen is soft.  Musculoskeletal:        General: Tenderness present.     Comments: Patient with redness, tenderness in the left lower foot.  No visible wounds.  Skin is cracked and dry.   Neurological:     General: No focal deficit present.     Mental Status: She is alert and oriented to person, place, and time. Mental status is at baseline.  Psychiatric:        Mood and Affect: Mood normal.        Behavior: Behavior normal.        Thought Content: Thought content normal.        Judgment: Judgment normal.      MD Evaluation Airway: WNL Heart: WNL Abdomen: WNL  Chest/ Lungs: WNL ASA  Classification: 3 Mallampati/Airway Score: Two   Imaging: IR Radiologist  Eval & Mgmt  Result Date: 03/24/2022 EXAM: NEW PATIENT OFFICE VISIT CHIEF COMPLAINT: Electronic medical record HISTORY OF PRESENT ILLNESS: Electronic medical record REVIEW OF SYSTEMS: Electronic medical record PHYSICAL EXAMINATION: Electronic medical record ASSESSMENT AND PLAN: Electronic medical record Electronically Signed   By: Corrie Mckusick D.O.   On: 03/24/2022 16:20   US ARTERIAL SEG MULTIPLE LE (ABI, SEGMENTAL PRESSURES, PVR'S)  Result Date: 03/24/2022 CLINICAL DATA:  89 year old female with a history of critical limb ischemia, left foot resting pain and wound EXAM: NONINVASIVE PHYSIOLOGIC VASCULAR STUDY OF BILATERAL LOWER EXTREMITIES TECHNIQUE: Evaluation of both lower extremities was performed at rest, including calculation of ankle-brachial indices, multiple segmental pressure evaluation, segmental Doppler and segmental pulse volume recording. COMPARISON:  None Available. FINDINGS: Right: Resting ankle brachial index:  0.57 Segmental blood pressure: Upper extremity pressures are symmetric. Appropriate increased to the right thigh. Significant drop in the right low thigh and calf Doppler: Segmental Doppler demonstrates monophasic waveforms of the right lower extremity. Pulse volume recording: Segmental PVR demonstrates decreased amplitude and quality. Left: Resting ankle brachial index: Non calculable Segmental blood pressure: Upper extremity pressures are symmetric. No pressures obtained in the left lower extremity. Doppler: Segmental Doppler demonstrates abnormal monophasic waveforms throughout. Pulse volume recording: Segmental PVR demonstrates artifact. Additional: IMPRESSION: Right: Resting ABI in the moderate range arterial occlusive disease. Segmental exam demonstrates evidence of significant femoropopliteal disease and likely iliac disease. Left: Resting ABI non calculable. Segmental exam demonstrates advanced multi segment disease of the iliac femoropopliteal and tibial segments. Signed, Dulcy Fanny. Nadene Rubins, RPVI [facility] Radiology Electronically Signed   By: Corrie Mckusick D.O.   On: 03/24/2022 14:39   CT ANGIO AO+BIFEM W & OR WO CONTRAST  Result Date: 03/18/2022 CLINICAL DATA:  89 year old female with left leg pain EXAM: CT ANGIOGRAPHY OF ABDOMINAL AORTA WITH ILIOFEMORAL RUNOFF TECHNIQUE: Multidetector CT imaging of the abdomen, pelvis and lower extremities was performed using the standard protocol during bolus administration of intravenous contrast. Multiplanar CT image reconstructions and MIPs were obtained to evaluate the vascular anatomy. RADIATION DOSE REDUCTION: This exam was performed according to the departmental dose-optimization program which includes automated exposure control, adjustment of the mA and/or kV according to patient size and/or use of iterative reconstruction technique. CONTRAST:  190mL ISOVUE-370 IOPAMIDOL (ISOVUE-370) INJECTION 76% COMPARISON:  Abdominal CT 10/08/2014 FINDINGS: VASCULAR Aorta: Atherosclerotic changes of the lower thoracic and the abdominal aorta. No pedunculated plaque or ulcerated plaque. No dissection. No periaortic inflammatory changes. Celiac: Patent, with no significant atherosclerotic changes. SMA: Patent with mild atherosclerotic changes.  Branches patent. Renals: - Right: Single right renal artery with mild atherosclerosis at the origin. - Left: Single left renal artery with mild atherosclerosis at the origin. IMA: Inferior mesenteric artery is patent. Right lower extremity: Mild to moderate atherosclerotic changes of the right common iliac artery without high-grade stenosis or occlusion. Hypogastric artery patent with mild atherosclerosis. Pelvic arteries patent. External iliac artery of relatively small caliber with moderate atherosclerotic changes. No high-grade stenosis or occlusion. Atherosclerotic changes of the common femoral artery with predominantly posterior wall calcifications and  no high-grade stenosis. Profunda femoris and thigh branches are patent. Advanced atherosclerotic changes of the right superficial femoral artery which is occluded just after the origin. Reconstitution of the popliteal artery via collateral flow, small caliber. Typical appearance of the trifurcation. Anterior tibial artery is patent from the origin to  the ankle. Tibioperoneal trunk patent. Peroneal artery patent from the origin to the ankle. Posterior tibial artery appears occluded after the origin. Left lower extremity: Moderate atherosclerotic changes of the common iliac artery with mixed calcified and soft plaque. No high-grade stenosis or occlusion of the common iliac artery. Hypogastric artery remains patent.  With patency of pelvic arteries. Occlusion of the left external iliac artery after the origin. Reconstitution via collateral flow from the circumflex iliac artery and the inferior epigastric artery. Common femoral artery patent with mild atherosclerotic changes. Profunda femoris and the thigh branches are patent. Superficial femoral artery demonstrates advanced atherosclerotic changes, with short segment of patent see proximally and occlusion in the proximal third. There is reconstitution of the popliteal artery via collateral flow with moderate atherosclerotic changes. Typical appearance of the trifurcation. Anterior tibial artery is patent proximally and appears occluded distally secondary to some calcified plaque. Tibioperoneal trunk is patent. Peroneal artery is patent from the origin to the ankle. Posterior tibial artery appears occluded throughout its length secondary to calcified plaque. Veins: Unremarkable appearance of the venous system. Review of the MIP images confirms the above findings. NON-VASCULAR Lower chest: Bronchial wall thickening in the bronchi of the right infrahilar region with some mixed interstitial and airspace disease of the right middle lobe. No pneumothorax or pleural effusion.  Hepatobiliary: Redemonstration rounded lesion within the right liver with discontinuous peripheral enhancement, compatible with hepatic hemangioma. Diameter estimated 7.6 cm which is only slightly larger than the prior 6.2 cm. Otherwise unremarkable liver. Unremarkable gall bladder. Pancreas: Unremarkable. Spleen: Unremarkable. Adrenals/Urinary Tract: - Right adrenal gland: Unremarkable - Left adrenal gland: Unremarkable. - Right kidney: No hydronephrosis. No nephrolithiasis. Progressive renal cortical thinning with some more focal areas of renal thinning, potentially from prior infection/infarction. No focal lesion. - Left Kidney: No hydronephrosis. No nephrolithiasis. General renal cortical thinning with some more focal regions of thinning, potentially from prior infection/infarction. No focal lesion. - Urinary Bladder: Some gas within the urinary bladder, presumably secondary to instrumentation. Stomach/Bowel: - Stomach: Hiatal hernia.  Otherwise unremarkable stomach. - Small bowel: Unremarkable - Appendix: Appendix is not visualized, however, no inflammatory changes are present adjacent to the cecum to indicate an appendicitis. - Colon: No inflammatory changes of the colon. Left-sided colonic diverticular disease. No evidence of bowel obstruction Lymphatic: No adenopathy. Mesenteric: Overall there is decreased number and size of multiple mesenteric lymph nodes. Small lymph nodes/nodule measuring 8 mm within the lower abdomen, anterior to the soft tissue mass that was previously identified in the presacral space. The soft tissue in the presacral region demonstrates interval enlargement, previously 5.6 cm x 6.3 cm, currently 7.3 cm x 7.9 cm. Relatively uniform enhancement, with low Hounsfield units on the arterial phase. No inflammatory changes. This is intimately associated with the right adnexa Reproductive: Hysterectomy Other: Nonspecific pelvic floor laxity Musculoskeletal: Degenerative changes of the spine.  No bony canal narrowing. No displaced fracture. Degenerative changes of the hips. Degenerative changes of the knees. Surgical changes of the right ankle. Intramuscular lipoma of the proximal right thigh. IMPRESSION: Multilevel peripheral arterial disease, including: -aortic atherosclerosis.  Aortic Atherosclerosis (ICD10-I70.0). -mild to moderate right-sided iliac disease without high-grade stenosis or occlusion -advanced right-sided femoropopliteal disease, with long segment occlusion of the SFA and reconstitution of the popliteal artery -moderate right-sided tibial arterial disease, with what appears to be patency of both anterior tibial artery and peroneal artery to the ankle -advanced left-sided iliac disease including occlusion of the left external iliac artery -advanced left-sided femoropopliteal disease including  occlusion of the SFA, reconstitution of the popliteal artery -moderate to advanced left-sided tibial disease, with what appears to be single patent peroneal artery to the ankle **An incidental finding of potential clinical significance has been found. Increasing size of presacral soft tissue mass which appears to be associated with the right adnexa/ovary. A low grade gynecologic malignancy cannot be excluded. Referral for gynecologic oncology may be considered.** Inflammatory changes partially imaged in the right chest. Correlation with patient symptoms/history may be useful. Additional ancillary additional ancillary findings as above Signed, Dulcy Fanny. Nadene Rubins, RPVI Vascular and Interventional Radiology Specialists [facility] Radiology Electronically Signed   By: Corrie Mckusick D.O.   On: 03/18/2022 09:13   IR Radiologist Eval & Mgmt  Result Date: 03/12/2022 EXAM: NEW PATIENT OFFICE VISIT CHIEF COMPLAINT: Electronic medical record HISTORY OF PRESENT ILLNESS: Electronic medical record REVIEW OF SYSTEMS: Electronic medical record PHYSICAL EXAMINATION: Electronic medical record ASSESSMENT AND  PLAN: Electronic medical record Electronically Signed   By: Corrie Mckusick D.O.   On: 03/12/2022 16:08    Labs:  CBC: Recent Labs    04/02/22 0720  WBC 6.0  HGB 11.3*  HCT 36.4  PLT 317    COAGS: Recent Labs    04/02/22 0720  INR 1.0    BMP: Recent Labs    02/23/22 1441 04/02/22 0851  NA 138 140  K 3.7 3.0*  CL 101 102  CO2 19* 28  GLUCOSE 220* 248*  BUN 21 11  CALCIUM 8.5* 8.4*  CREATININE 1.15* 0.97  GFRNONAA  --  57*    LIVER FUNCTION TESTS: No results for input(s): "BILITOT", "AST", "ALT", "ALKPHOS", "PROT", "ALBUMIN" in the last 8760 hours.  TUMOR MARKERS: No results for input(s): "AFPTM", "CEA", "CA199", "CHROMGRNA" in the last 8760 hours.  Assessment and Plan: Patient with past medical history of left lower extremity claudication presents with complaint of left foot and calf pain.  IR consulted for angiogram with intervention at the request of Dr. Debbe Bales. Case reviewed by Dr. Earleen Newport who has met with the patient and her daughter in consultation and approves patient for procedure.  Patient presents today in their usual state of health.  She has been NPO and is not currently on blood thinners. She does take aspirin.  Preprocedure medications ordered by Dr. Earleen Newport to include Vitamin C, aspirin, and sodium bicarb.   Risks and benefits of cerebral angiogram with intervention were discussed with the patient including, but not limited to bleeding, infection, vascular injury, contrast induced renal failure, stroke or even death.  This interventional procedure involves the use of X-rays and because of the nature of the planned procedure, it is possible that we will have prolonged use of X-ray fluoroscopy.  Potential radiation risks to you include (but are not limited to) the following: - A slightly elevated risk for cancer  several years later in life. This risk is typically less than 0.5% percent. This risk is low in comparison to the normal incidence of  human cancer, which is 33% for women and 50% for men according to the [employer]. - Radiation induced injury can include skin redness, resembling a rash, tissue breakdown / ulcers and hair loss (which can be temporary or permanent).   The likelihood of either of these occurring depends on the difficulty of the procedure and whether you are sensitive to radiation due to previous procedures, disease, or genetic conditions.   IF your procedure requires a prolonged use of radiation, you will be notified and  given written instructions for further action.  It is your responsibility to monitor the irradiated area for the 2 weeks following the procedure and to notify your physician if you are concerned that you have suffered a radiation induced injury.    All of the patient's questions were answered, patient is agreeable to proceed.  Consent signed and in chart.  Thank you for this interesting consult.  I greatly enjoyed meeting [name] and look forward to participating in their care.  A copy of this report was sent to the requesting provider on this date.  Electronically Signed: Docia Barrier, PA 04/02/2022, 9:32 AM   I spent a total of  30 Minutes   in face to face in clinical consultation, greater than 50% of which was counseling/coordinating care for left lower extremity claudication

## 2022-04-02 NOTE — Progress Notes (Signed)
Patient and daughter was given discharge instructions. Both verbalized understanding. 

## 2022-04-06 ENCOUNTER — Other Ambulatory Visit: Payer: Self-pay | Admitting: Interventional Radiology

## 2022-04-06 DIAGNOSIS — I739 Peripheral vascular disease, unspecified: Secondary | ICD-10-CM

## 2022-04-15 ENCOUNTER — Ambulatory Visit (HOSPITAL_COMMUNITY): Payer: 59

## 2022-04-30 ENCOUNTER — Ambulatory Visit (INDEPENDENT_AMBULATORY_CARE_PROVIDER_SITE_OTHER): Payer: 59 | Admitting: Internal Medicine

## 2022-04-30 ENCOUNTER — Telehealth: Payer: Self-pay | Admitting: *Deleted

## 2022-04-30 DIAGNOSIS — I739 Peripheral vascular disease, unspecified: Secondary | ICD-10-CM

## 2022-04-30 NOTE — Assessment & Plan Note (Addendum)
[name] is an 89 year old female with peripheral arterial disease status post left lower extremity stent placement last month (unsure of exact details as I do not see the records for this).  Patient's daughter called in to the [facility] requesting that the patient get a manual wheelchair.  The patient has not been able to ambulate on her own since her surgery, and has required full assistance in ambulating.  The patient's daughter is getting married on 3/23 and notes that she will not be able to help her mother ambulate well at the wedding, thus, she would benefit from a wheelchair.  The patient's mobility limitation impairs her ability to participate her ADLs such as toileting, feeding, grooming, and bathing.  This mobility limitation also cannot be resolved by the use of a cane or walker, thus she needs a wheelchair.  She is able to safely use a manual wheelchair, maneuver it, and propel it, as she has a caregiver to assist her with these tasks.  The patient's functional mobility deficit can be resolved by the use of a manual wheelchair.

## 2022-04-30 NOTE — Telephone Encounter (Signed)
Appt became available today at 2:15. Daughter notified. Please call [phone].

## 2022-04-30 NOTE — Progress Notes (Signed)
[facility] Internal Medicine Residency Telephone Encounter Continuity Care Appointment  HPI:  This telephone encounter was created for [name] on 04/30/2022 for the following purpose/cc: wheelchair request   Past Medical History:  Past Medical History:  Diagnosis Date   Adenocarcinoma of lung ([place])     Right upper lobe adenocarcinoma. s/p right lower lobectomy 12/15/10   ARTHRITIS, KNEE 03/24/2006   CHF (congestive heart failure) ([place])    CVA (cerebrovascular accident) ([place]) [id number]   right embolic stroke, no residual deficits   Depression 12/02/2010   DIABETES MELLITUS, TYPE II 2000   Diverticulitis    GERD 12/04/2005   Hemangioma of liver 12/02/2010   History of meningioma of the brain 07/21/2010   Per MRI 10/2010 stable.    History of pulmonary embolism 04/06/2013   HYPERLIPIDEMIA 12/04/2005   HYPERTENSION 12/04/2005   Meningioma ([place]) 07/21/2010   Right upper lobe, Adenocarcinoma of lung    11/09 - BAL reactive findings no malignancy. 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 Last him him 03/2008 when she fell out of care.  Last imagining 2/10 - stable to minimal increase in RUL mass. CT chest 11/12/10 increasing in size PET scan 11/18/10 Interval increase in size and metabolic activity    Rotator cuff arthropathy 05/19/2021     ROS:     Assessment / Plan / Recommendations:  Please see A&P under problem oriented charting for assessment of the patient's acute and chronic medical conditions.  As always, pt is advised that if symptoms worsen or new symptoms arise, they should go to an urgent care facility or to to ER for further evaluation.   Consent and Medical Decision Making:  Patient discussed with Dr. Evette Doffing This is a telephone encounter between the daughter of [name], Burns, and [name] on 04/30/2022 for wheelchair request. The visit  was conducted with the patient and the patient's daughter located at home and [name] at [facility]. The patient's identity was confirmed using their DOB and current address. The patient has consented to being evaluated through a telephone encounter and understands the associated risks (an examination cannot be done and the patient may need to come in for an appointment) / benefits (allows the patient to remain at home, decreasing exposure to coronavirus). I personally spent 7 minutes on medical discussion.   PAD (peripheral artery disease) ([place]) [name] is an 89 year old female with peripheral arterial disease status post left lower extremity stent placement last month (unsure of exact details as I do not see the records for this).  Patient's daughter called in to the [facility] requesting that the patient get a manual wheelchair.  The patient has not been able to ambulate on her own since her surgery, and has required full assistance in ambulating.  The patient's daughter is getting married on 3/23 and notes that she will not be able to help her mother ambulate well at the wedding, thus, she would benefit from a wheelchair.  The patient's mobility limitation impairs her ability to participate her ADLs such as toileting, feeding, grooming, and bathing.  This mobility limitation also cannot be resolved by the use of a cane or walker, thus she needs a wheelchair.  She is able to safely use a manual wheelchair, maneuver it, and propel it, as she has a caregiver to assist her with these tasks.  The  patient's functional mobility deficit can be resolved by the use of a manual wheelchair.

## 2022-04-30 NOTE — Telephone Encounter (Addendum)
Patient's daughter called in requesting manual w/c. States she is getting married on 3/23 and won't be able to assist patient walking. First available appt to discuss need is 3/19. She may not be able to obtain manual w/c by 3/23. Daughter was advised to call friends, neighbors, church to see if anyone has a w/c they may borrow.  Of note, patient was referred to neuro rehab on 11/25/21 for power w/c. They were not able to keep that appt as patient had to have surgery. Will confer with Referral Coordinator.

## 2022-05-01 DIAGNOSIS — M1711 Unilateral primary osteoarthritis, right knee: Secondary | ICD-10-CM | POA: Diagnosis not present

## 2022-05-01 DIAGNOSIS — I5032 Chronic diastolic (congestive) heart failure: Secondary | ICD-10-CM | POA: Diagnosis not present

## 2022-05-01 DIAGNOSIS — I739 Peripheral vascular disease, unspecified: Secondary | ICD-10-CM | POA: Diagnosis not present

## 2022-05-01 NOTE — Progress Notes (Signed)
CC: ***  HPI:   [name] is a 89 y.o. female with a past medical  history of obesity, hypertension, hyperlipidemia, CVA, CHF, PE, lung adenocarcinoma, and meningioma who presents requesting wheelchair. She was last seen at [facility] 02-2022.   WHEELCHAIR Why mobility is impaired? Which medical conditions . . . What can patient not do without one? Specific activities . . .      HYPERTENSION Patient living with HTN, currently taking coreg 6.25mg  BID, losartan 25mg  daily, and jardiance 25mg  daily. BP is elevated today at 170/73 with repeat 178/83. Caregivers state that she has not taken her coreg or losartan today given difficulties finding transportation to [facility] leading to missed dose. We will be stopping jardiance at this time given vulvovaginal candidiasis likely from increased jardiance dose. Anticipate improved BP control after taking coreg and losartan. Plan: -continue losartan and coreg -stop jardiance -f/u in 2 weeks, can adjust BP meds if still uncontrolled  x   DIARRHEA Patient experiencing diarrhea and associated fecal incontinence over the past 2 days. Caregivers state that she has been soiling herself relatively frequently over this time with loose stools noted in her bed. Although her jardiance was recently increased, likely not the culprit for diarrhea. She is afebrile. Will check a GI panel for presumed viral gastroenteritis. This diarrhea is likely self-limiting.  Plan: -encouraged adequate hydration -f/u GI path panel -f/u in 2 weeks  Xxx    CANDIDIASIS Patient with 2-day history of significant redness and swelling of her vulva, vagina, and inguinal areas bilaterally. Caregivers note that she has been having more urinary incontinence during this and has been itching her genital area frequently over the past 2 days. On exam, she does have significant redness and swelling of her vulva, vagina, and inguinal areas. Also noted to have white, curd-like vaginal  discharge. She is afebrile. Given history and physical exam findings, this is consistent with vulvovaginal candidiasis. She has a recent dose increase of her jardiance from 10mg  to 25mg  daily about 1 week ago. Given chronology, it is reasonable to suspect that increased jardiance dose may have precipitated development of vulvovaginal candidiasis. Will stop jardiance therapy while treating infection. Given uncontrolled T2DM and severity of symptoms, will treat with extended course of diflucan. Prescribed diflucan 200mg  once weekly for 4-week course. Will have her follow up in 2 weeks to assess for improvement in symptoms.   Plan: -stop jardiance -diflucan 200mg  once weekly for 4 weeks -f/u in 2 weeks   Xxx       Past Medical History:  Diagnosis Date   Adenocarcinoma of lung ([place])     Right upper lobe adenocarcinoma. s/p right lower lobectomy 12/15/10   ARTHRITIS, KNEE 03/24/2006   CHF (congestive heart failure) ([place])    CVA (cerebrovascular accident) ([place]) 123456   right embolic stroke, no residual deficits   Depression 12/02/2010   DIABETES MELLITUS, TYPE II 2000   Diverticulitis    GERD 12/04/2005   Hemangioma of liver 12/02/2010   History of meningioma of the brain 07/21/2010   Per MRI 10/2010 stable.    History of pulmonary embolism 04/06/2013   HYPERLIPIDEMIA 12/04/2005   HYPERTENSION 12/04/2005   Meningioma ([place]) 07/21/2010   Right upper lobe, Adenocarcinoma of lung    11/09 - BAL reactive findings no malignancy. 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 Last him him 03/2008 when she fell out  of care.  Last imagining 2/10 - stable to minimal increase in RUL mass. CT chest 11/12/10 increasing in size PET scan 11/18/10 Interval increase in size and metabolic activity    Rotator cuff arthropathy 05/19/2021     Review of Systems:    Reports *** Denies *** (subjective fever?, pain  anywhere?, bowel changes?)   Physical Exam:  There were no vitals filed for this visit.  General:   awake and alert, sitting comfortably in chair, cooperative, not in acute distress Skin:   warm and dry, intact without any obvious lesions or scars, no rashes Head:   normocephalic and atraumatic, oral mucosa moist with good dentition, no lymphadenopathy Eyes:   extraocular movements intact, conjunctivae pink, pupils round and reactive to light, no periorbital swelling or scleral icterus Ears:   pinnae normal, no discharge or external lesions  Nose:   symmetrical and without mucosal inflammation, no external lesions or discharge Lungs:   normal respiratory effort, breathing unlabored, symmetrical chest rise, no crackles or wheezing Cardiac:   regular rate and rhythm, normal S1 and S2, capillary refill 2-3 seconds, dorsalis pedis pulses intact bilaterally, no pitting edema Abdomen:   soft and non-distended, normoactive bowel sounds present in all four quadrants, no guarding or palpable masses Musculoskeletal:   full range of motion in joints, motor strength 5 /5 in all four extremities, no obvious deformities or joint tenderness Neurologic:   oriented to person-place-time, moving all extremities, sensation to light touch intact, no gross focal deficits Psychiatric:   euthymic mood with congruent affect, intelligible speech    Assessment & Plan:   No problem-specific Assessment & Plan notes found for this encounter.     See Encounters Tab for problem based charting.  Patient {GC/GE:[id number]::"discussed with","seen with"} Dr. {NAMES:[id number]::"Guilloud","Hoffman","Mullen","Narendra","Williams","Vincent"}

## 2022-05-01 NOTE — Progress Notes (Signed)
Internal Medicine Clinic Attending ° °Case discussed with Dr. Atway  At the time of the visit.  We reviewed the resident’s history and exam and pertinent patient test results.  I agree with the assessment, diagnosis, and plan of care documented in the resident’s note.  °

## 2022-05-04 ENCOUNTER — Other Ambulatory Visit: Payer: Self-pay | Admitting: Internal Medicine

## 2022-05-04 DIAGNOSIS — R32 Unspecified urinary incontinence: Secondary | ICD-10-CM

## 2022-05-04 NOTE — Progress Notes (Signed)
amb  

## 2022-05-05 ENCOUNTER — Ambulatory Visit (INDEPENDENT_AMBULATORY_CARE_PROVIDER_SITE_OTHER): Payer: 59 | Admitting: Student

## 2022-05-05 DIAGNOSIS — I739 Peripheral vascular disease, unspecified: Secondary | ICD-10-CM

## 2022-05-09 ENCOUNTER — Other Ambulatory Visit: Payer: Self-pay | Admitting: Student

## 2022-05-09 DIAGNOSIS — E669 Obesity, unspecified: Secondary | ICD-10-CM

## 2022-05-09 DIAGNOSIS — M1711 Unilateral primary osteoarthritis, right knee: Secondary | ICD-10-CM

## 2022-05-09 DIAGNOSIS — G40909 Epilepsy, unspecified, not intractable, without status epilepticus: Secondary | ICD-10-CM

## 2022-05-09 DIAGNOSIS — Z8673 Personal history of transient ischemic attack (TIA), and cerebral infarction without residual deficits: Secondary | ICD-10-CM

## 2022-05-09 DIAGNOSIS — E66811 Obesity, class 1: Secondary | ICD-10-CM

## 2022-05-09 DIAGNOSIS — R5381 Other malaise: Secondary | ICD-10-CM

## 2022-05-20 ENCOUNTER — Telehealth: Payer: Self-pay

## 2022-05-20 NOTE — Telephone Encounter (Signed)
Return call - no answer; message left to call the office if further assistance is needed.

## 2022-05-20 NOTE — Telephone Encounter (Signed)
Pt's daughter requesting to speak with a nurse about something, please call pt back.

## 2022-05-24 ENCOUNTER — Encounter (HOSPITAL_COMMUNITY): Payer: Self-pay

## 2022-05-24 ENCOUNTER — Emergency Department (HOSPITAL_COMMUNITY)
Admission: EM | Admit: 2022-05-24 | Discharge: 2022-05-24 | Disposition: A | Discharge status: Home / Self Care | Payer: 59 | Attending: Emergency Medicine | Admitting: Emergency Medicine

## 2022-05-24 ENCOUNTER — Other Ambulatory Visit: Payer: Self-pay | Admitting: Student

## 2022-05-24 ENCOUNTER — Emergency Department (HOSPITAL_COMMUNITY): Payer: 59

## 2022-05-24 ENCOUNTER — Other Ambulatory Visit: Payer: Self-pay

## 2022-05-24 DIAGNOSIS — L03116 Cellulitis of left lower limb: Secondary | ICD-10-CM

## 2022-05-24 DIAGNOSIS — E876 Hypokalemia: Secondary | ICD-10-CM | POA: Diagnosis not present

## 2022-05-24 DIAGNOSIS — Z7982 Long term (current) use of aspirin: Secondary | ICD-10-CM | POA: Insufficient documentation

## 2022-05-24 DIAGNOSIS — R739 Hyperglycemia, unspecified: Secondary | ICD-10-CM | POA: Insufficient documentation

## 2022-05-24 DIAGNOSIS — M7989 Other specified soft tissue disorders: Secondary | ICD-10-CM | POA: Diagnosis not present

## 2022-05-24 DIAGNOSIS — M79672 Pain in left foot: Secondary | ICD-10-CM | POA: Diagnosis present

## 2022-05-24 DIAGNOSIS — Z7902 Long term (current) use of antithrombotics/antiplatelets: Secondary | ICD-10-CM | POA: Insufficient documentation

## 2022-05-24 DIAGNOSIS — Z794 Long term (current) use of insulin: Secondary | ICD-10-CM | POA: Insufficient documentation

## 2022-05-24 DIAGNOSIS — M79675 Pain in left toe(s): Secondary | ICD-10-CM | POA: Diagnosis not present

## 2022-05-24 LAB — BASIC METABOLIC PANEL
Anion gap: 8 (ref 5–15)
BUN: 9 mg/dL (ref 8–23)
CO2: 29 mmol/L (ref 22–32)
Calcium: 8.2 mg/dL — ABNORMAL LOW (ref 8.9–10.3)
Chloride: 105 mmol/L (ref 98–111)
Creatinine, Ser: 0.89 mg/dL (ref 0.44–1.00)
GFR, Estimated: >60 mL/min (ref >60)
Glucose, Bld: 198 mg/dL — ABNORMAL HIGH (ref 70–99)
Potassium: 2.8 mmol/L — ABNORMAL LOW (ref 3.5–5.1)
Sodium: 142 mmol/L (ref 135–145)

## 2022-05-24 LAB — CBC WITH DIFFERENTIAL/PLATELET
Abs Immature Granulocytes: 0.02 10*3/uL (ref 0.00–0.07)
Basophils Absolute: 0 10*3/uL (ref 0.0–0.1)
Basophils Relative: 0 %
Eosinophils Absolute: 0.1 10*3/uL (ref 0.0–0.5)
Eosinophils Relative: 2 %
HCT: 32.1 % — ABNORMAL LOW (ref 36.0–46.0)
Hemoglobin: 9.5 g/dL — ABNORMAL LOW (ref 12.0–15.0)
Immature Granulocytes: 0 %
Lymphocytes Relative: 35 %
Lymphs Abs: 1.6 10*3/uL (ref 0.7–4.0)
MCH: 25.4 pg — ABNORMAL LOW (ref 26.0–34.0)
MCHC: 29.6 g/dL — ABNORMAL LOW (ref 30.0–36.0)
MCV: 85.8 fL (ref 80.0–100.0)
Monocytes Absolute: 0.5 10*3/uL (ref 0.1–1.0)
Monocytes Relative: 10 %
Neutro Abs: 2.5 10*3/uL (ref 1.7–7.7)
Neutrophils Relative %: 53 %
Platelets: 281 10*3/uL (ref 150–400)
RBC: 3.74 MIL/uL — ABNORMAL LOW (ref 3.87–5.11)
RDW: 16.6 % — ABNORMAL HIGH (ref 11.5–15.5)
WBC: 4.6 10*3/uL (ref 4.0–10.5)
nRBC: 0 % (ref 0.0–0.2)

## 2022-05-24 MED ORDER — POTASSIUM CHLORIDE CRYS ER 20 MEQ PO TBCR
40.0000 meq | EXTENDED_RELEASE_TABLET | Freq: Once | ORAL | Status: AC
  Administered 2022-05-24: 40 meq via ORAL
  Filled 2022-05-24: qty 2

## 2022-05-24 MED ORDER — CEPHALEXIN 500 MG PO CAPS: 500.0000 mg | ORAL_CAPSULE | Freq: Four times a day (QID) | ORAL | 0 refills | Status: DC

## 2022-05-24 NOTE — Progress Notes (Signed)
Received page on on-call pager. Returned call and verified identity via DOB and home address. Spoke with daughter and patient. State that patient's toe is dark in color and appears that her left toe is rotting. States that the skin is coming off her toe. Patient has been ambulating on her left leg without much difficulty. States not having any pain. States that foot is mildly cold but similar to right foot. Denies fevers or chills at home.   Of note, patient with PAD and underwent treatment of L EIA occlusion and L SFA occlusion with PTA and stenting with IR on 04/02/2022.   Given that the clinic is closed today and no available way for visualization, I have advised for patient to go to the ED for further evaluation given concern for possible infection or limb ischemia from occlusion of stent. She has been adherent with her ASA, plavix, and crestor.    Merrilyn Puma, MD [facility], PGY-3 05/24/2022, 10:24 AM

## 2022-05-24 NOTE — Discharge Instructions (Signed)
You were seen in the emergency department for some redness and swelling of your left foot.  You had no definite sign of deep infection, so we are starting you on some antibiotics for skin infection.  Please contact podiatry Dr. Allena Katz for close follow-up.  Return to the emergency department if high fevers or worsening symptoms.

## 2022-05-24 NOTE — ED Provider Notes (Signed)
[place] EMERGENCY DEPARTMENT AT [facility] Provider Note   CSN: [id number] Arrival date & time: 05/24/22  1151     History {Add pertinent medical, surgical, social history, OB history to HPI:1} Chief Complaint  Patient presents with   Foot Pain    [name] is a 89 y.o. female.  She is brought in by her family from home and daughter is giving most of the history.  She had left lower extremity stenting for foot ischemia.  She has been doing well since then although continues to have pain on and off.  Family is concerned her great toe may be getting infected because it looked kind of swollen.  She has not been having a fever, has been eating and drinking well.  They said she actually was ambulating today which is an improvement for her.  The history is provided by the patient and a relative.  Foot Pain This is a chronic problem. The current episode started yesterday. The problem has not changed since onset.Pertinent negatives include no chest pain, no abdominal pain and no shortness of breath. Nothing aggravates the symptoms. Nothing relieves the symptoms. She has tried nothing for the symptoms. The treatment provided no relief.       Home Medications Prior to Admission medications   Medication Sig Start Date End Date Taking? Authorizing Provider  Accu-Chek FastClix Lancets MISC CHECK BLOOD SUGAR THREE TIMES A DAY. DIAG CODE E11.9. INSULIN DEPENDENT 10/23/20   Merrilyn Puma, MD  acetaminophen-codeine (TYLENOL #3) 300-30 MG tablet TAKE 1 TABLET BY MOUTH DAILY AS NEEDED FOR MODERATE PAIN OR SEVERE PAIN. 11/27/21   Merrilyn Puma, MD  aspirin EC 81 MG tablet Take 1 tablet (81 mg total) by mouth daily. 02/18/21   Elige Radon, MD  barrier cream (NON-SPECIFIED) CREA Apply 1 application topically 2 (two) times daily as needed (place on vulva twice daily as needed for redness and swelling). 10/23/20   Merrilyn Puma, MD  Blood Glucose Monitoring Suppl (ACCU-CHEK GUIDE)  w/Device KIT 1 each by Does not apply route 3 (three) times daily. 10/23/20   Merrilyn Puma, MD  carvedilol (COREG) 6.25 MG tablet TAKE 1 TABLET BY MOUTH 2 TIMES DAILY WITH A MEAL. 02/24/22   Merrilyn Puma, MD  cilostazol (PLETAL) 100 MG tablet TAKE 1 TABLET BY MOUTH TWICE A DAY 02/24/22   Merrilyn Puma, MD  clopidogrel (PLAVIX) 75 MG tablet Take 1 tablet (75 mg total) by mouth daily. 04/02/22   Lynnette Caffey A, PA-C  fluconazole (DIFLUCAN) 200 MG tablet Take 1 tablet (200 mg total) by mouth once a week. 03/05/22   Merrilyn Puma, MD  FLUoxetine (PROZAC) 10 MG capsule TAKE 1 CAPSULE BY MOUTH EVERY DAY 02/24/22   Merrilyn Puma, MD  glucose blood (ACCU-CHEK GUIDE) test strip Check blood sugar 3 times per day 10/23/20   Merrilyn Puma, MD  Incontinence Supplies MISC For use to address bowel and bladder incontinence. 07/04/15   Ruben Im, MD  Insulin Pen Needle (BD PEN NEEDLE NANO U/F) 32G X 4 MM MISC USE TO ADMINISTER SOLIQUA ONE TIME A DAY 01/01/21   Merrilyn Puma, MD  Insulin Pen Needle 32G X 4 MM MISC Use to administer soliqua one time a day 12/23/20   Merrilyn Puma, MD  levETIRAcetam (KEPPRA) 750 MG tablet TAKE 1 TABLET BY MOUTH 2 TIMES DAILY. 02/24/22   Merrilyn Puma, MD  losartan (COZAAR) 25 MG tablet TAKE 1 TABLET (25 MG TOTAL) BY MOUTH DAILY. 02/24/22   Merrilyn Puma, MD  omeprazole (PRILOSEC) 20 MG capsule Take 1 capsule (20 mg total) by mouth daily. 02/23/22   Belva Agee, MD  rosuvastatin (CRESTOR) 20 MG tablet TAKE 1 TABLET BY MOUTH EVERY DAY 02/24/22   Merrilyn Puma, MD      Allergies    Patient has no known allergies.    Review of Systems   Review of Systems  Constitutional:  Negative for fever.  Respiratory:  Negative for shortness of breath.   Cardiovascular:  Positive for leg swelling. Negative for chest pain.  Gastrointestinal:  Negative for abdominal pain.    Physical Exam Updated Vital Signs BP (!) 145/66 (BP Location: Right Arm)   Pulse 82   Temp 98.5 F (36.9  C) (Oral)   Resp 18   Ht 5\' 4"  (1.626 m)   Wt 90.3 kg   SpO2 98%   BMI 34.16 kg/m  Physical Exam Vitals and nursing note reviewed.  Constitutional:      General: She is not in acute distress.    Appearance: Normal appearance. She is well-developed.  HENT:     Head: Normocephalic and atraumatic.  Eyes:     Conjunctiva/sclera: Conjunctivae normal.  Cardiovascular:     Rate and Rhythm: Normal rate and regular rhythm.     Heart sounds: No murmur heard. Pulmonary:     Effort: Pulmonary effort is normal. No respiratory distress.     Breath sounds: Normal breath sounds.  Abdominal:     Palpations: Abdomen is soft.     Tenderness: There is no abdominal tenderness.  Musculoskeletal:        General: No tenderness.     Cervical back: Neck supple.     Right lower leg: No edema.     Left lower leg: Edema present.     Comments: She has some mild edema of her left foot.  There is no significant color difference or temperature difference between her 2 feet.  She is get some dried thickened nails and some partially avulsed skin at the tip of her great 2 on the left.  The skin underneath this looks noninfected.  There is no evidence of gangrenous tissue.  Skin:    General: Skin is warm and dry.     Capillary Refill: Capillary refill takes less than 2 seconds.  Neurological:     Mental Status: She is alert. Mental status is at baseline.     ED Results / Procedures / Treatments   Labs (all labs ordered are listed, but only abnormal results are displayed) Labs Reviewed  BASIC METABOLIC PANEL  CBC WITH DIFFERENTIAL/PLATELET    EKG None  Radiology No results found.  Procedures Procedures  {Document cardiac monitor, telemetry assessment procedure when appropriate:1}  Medications Ordered in ED Medications - No data to display  ED Course/ Medical Decision Making/ A&P   {   Click here for ABCD2, HEART and other calculatorsREFRESH Note before signing :1}                           Medical Decision Making Amount and/or Complexity of Data Reviewed Labs: ordered. Radiology: ordered.   This patient complains of ***; this involves an extensive number of treatment Options and is a complaint that carries with it a high risk of complications and morbidity. The differential includes ***  I ordered, reviewed and interpreted labs, which included *** I ordered medication *** and reviewed PMP when indicated. I ordered imaging studies which included *** and  I independently    visualized and interpreted imaging which showed *** Additional history obtained from *** Previous records obtained and reviewed *** I consulted *** and discussed lab and imaging findings and discussed disposition.  Cardiac monitoring reviewed, *** Social determinants considered, *** Critical Interventions: ***  After the interventions stated above, I reevaluated the patient and found *** Admission and further testing considered, ***   {Document critical care time when appropriate:1} {Document review of labs and clinical decision tools ie heart score, Chads2Vasc2 etc:1}  {Document your independent review of radiology images, and any outside records:1} {Document your discussion with family members, caretakers, and with consultants:1} {Document social determinants of health affecting pt's care:1} {Document your decision making why or why not admission, treatments were needed:1} Final Clinical Impression(s) / ED Diagnoses Final diagnoses:  None    Rx / DC Orders ED Discharge Orders     None

## 2022-05-24 NOTE — ED Triage Notes (Signed)
Pt arrived POV from home c/o left foot pain and possible infection in her left big toe. Pt had a stent placed in her left leg 04/02/2022. Per her family she had a small place on the side of the toe but the area is now spreading.

## 2022-05-27 ENCOUNTER — Telehealth: Payer: Self-pay

## 2022-05-27 ENCOUNTER — Telehealth: Payer: Self-pay | Admitting: *Deleted

## 2022-05-27 NOTE — Telephone Encounter (Signed)
        Patient  visited [facility] ed on 05/24/2022  for treatment    Telephone encounter attempt :  1st  A HIPAA compliant voice message was left requesting a return call.  Instructed patient to call back at [phone]. [name] -[facility] [place], Population Health [phone] [address] [place] , [place] [address] Email : [email or web address]

## 2022-05-27 NOTE — Progress Notes (Signed)
This patient is appearing on the insurance-provided list for being at risk of failing the adherence measure for cholesterol and diabetes medications this calendar year.   Medication: losartan and rosuvastatin Last fill date: 02/24/2022 for a 90 day supply.   Spoke with patient's daughter who states they have had difficulty getting filling patients medications due to not having transportation. They are currently living in a hotel due to their apartment having black mold. Provided patient with [facility], [facility] number and recommended she reach out to them regarding setting up delivery services.   Valeda Malm, Pharm.D. PGY-2 Ambulatory Care Pharmacy Resident

## 2022-05-28 ENCOUNTER — Telehealth: Payer: Self-pay | Admitting: *Deleted

## 2022-05-28 NOTE — Telephone Encounter (Signed)
   Telephone encounter was:  Successful.  05/28/2022 Name: [name] MRN: [id number] DOB: 1933/07/08  [name] is a 89 y.o. year old female who is a primary care patient of Merrilyn Puma, MD . The community resource team was consulted for assistance with Transportation Needs   Care guide performed the following interventions: Patient provided with information about care guide support team and interviewed to confirm resource needs.  Follow Up Plan:  No further follow up planned at this time. The patient has been provided with needed resources.  [name] -[facility], Population Health [phone] [address] [place] , [place] [address] Email : [email or web address]

## 2022-05-29 ENCOUNTER — Other Ambulatory Visit: Payer: Self-pay | Admitting: Student

## 2022-05-29 ENCOUNTER — Ambulatory Visit: Payer: 59 | Admitting: Physical Therapy

## 2022-05-29 DIAGNOSIS — E1151 Type 2 diabetes mellitus with diabetic peripheral angiopathy without gangrene: Secondary | ICD-10-CM

## 2022-06-01 ENCOUNTER — Telehealth: Payer: Self-pay

## 2022-06-01 NOTE — Telephone Encounter (Signed)
Patients daughter called she is requesting a call back from you. I asked her daughter what is the call regarding she did not want to go into further detail all she said was "we need to speak to the social worker we have some things going on"

## 2022-06-02 ENCOUNTER — Telehealth: Payer: Self-pay | Admitting: *Deleted

## 2022-06-02 NOTE — Telephone Encounter (Signed)
Call from patient's daughter. Patient has been living in a Motel for a few months due to [facility] in her home.   Patient's daughter states that she recently talked to someone who told her that she may be able to get a Voucher to help with the [facility] payments.  Has been trying to contact B. Ashe in the Clinics to ask about how to get a voucher.  Daughter can be reached at [phone].  Message to be sent to B. Allyne Gee and Juanell Fairly.

## 2022-06-04 ENCOUNTER — Telehealth: Payer: Self-pay | Admitting: *Deleted

## 2022-06-04 NOTE — Progress Notes (Signed)
  Care Coordination   Note   06/04/2022 Name: [name] MRN: [id number] DOB: 1933/06/03  [name] is a 89 y.o. year old female who sees Merrilyn Puma, MD for primary care. I reached out to Coral Else by phone today to offer care coordination services.  [name] was given information about Care Coordination services today including:   The Care Coordination services include support from the care team which includes your Nurse Coordinator, Clinical Social Worker, or Pharmacist.  The Care Coordination team is here to help remove barriers to the health concerns and goals most important to you. Care Coordination services are voluntary, and the patient may decline or stop services at any time by request to their care team member.   Care Coordination Consent Status: Patient agreed to services and verbal consent obtained.   Follow up plan:  Telephone appointment with care coordination team member scheduled for:  4/23  Encounter Outcome:  Pt. Scheduled [facility] Coordination Care Guide  Direct Dial: [phone]

## 2022-06-08 ENCOUNTER — Ambulatory Visit (INDEPENDENT_AMBULATORY_CARE_PROVIDER_SITE_OTHER): Payer: 59 | Admitting: Student

## 2022-06-08 VITALS — BP 157/64 | HR 81 | Temp 97.9°F | Wt 194.7 lb

## 2022-06-08 DIAGNOSIS — Z7984 Long term (current) use of oral hypoglycemic drugs: Secondary | ICD-10-CM

## 2022-06-08 DIAGNOSIS — E1159 Type 2 diabetes mellitus with other circulatory complications: Secondary | ICD-10-CM

## 2022-06-08 DIAGNOSIS — E1122 Type 2 diabetes mellitus with diabetic chronic kidney disease: Secondary | ICD-10-CM

## 2022-06-08 DIAGNOSIS — N183 Chronic kidney disease, stage 3 unspecified: Secondary | ICD-10-CM | POA: Diagnosis not present

## 2022-06-08 DIAGNOSIS — G40909 Epilepsy, unspecified, not intractable, without status epilepticus: Secondary | ICD-10-CM

## 2022-06-08 DIAGNOSIS — I5032 Chronic diastolic (congestive) heart failure: Secondary | ICD-10-CM | POA: Diagnosis not present

## 2022-06-08 DIAGNOSIS — I152 Hypertension secondary to endocrine disorders: Secondary | ICD-10-CM

## 2022-06-08 DIAGNOSIS — I739 Peripheral vascular disease, unspecified: Secondary | ICD-10-CM

## 2022-06-08 DIAGNOSIS — I130 Hypertensive heart and chronic kidney disease with heart failure and stage 1 through stage 4 chronic kidney disease, or unspecified chronic kidney disease: Secondary | ICD-10-CM | POA: Diagnosis not present

## 2022-06-08 DIAGNOSIS — I10 Essential (primary) hypertension: Secondary | ICD-10-CM

## 2022-06-08 DIAGNOSIS — E1151 Type 2 diabetes mellitus with diabetic peripheral angiopathy without gangrene: Secondary | ICD-10-CM

## 2022-06-08 DIAGNOSIS — N3946 Mixed incontinence: Secondary | ICD-10-CM

## 2022-06-08 LAB — GLUCOSE, CAPILLARY: Glucose-Capillary: 313 mg/dL — ABNORMAL HIGH (ref 70–99)

## 2022-06-08 LAB — POCT GLYCOSYLATED HEMOGLOBIN (HGB A1C): Hemoglobin A1C: 7.7 % — AB (ref 4.0–5.6)

## 2022-06-08 MED ORDER — ACETAMINOPHEN-CODEINE 300-30 MG PO TABS: ORAL_TABLET | ORAL | 0 refills | Status: DC

## 2022-06-08 MED ORDER — METFORMIN HCL ER 500 MG PO TB24: 500.0000 mg | ORAL_TABLET | Freq: Every day | ORAL | 3 refills | Status: DC

## 2022-06-08 MED ORDER — EMPAGLIFLOZIN 10 MG PO TABS: 10.0000 mg | ORAL_TABLET | Freq: Every day | ORAL | 2 refills | Status: DC

## 2022-06-08 MED ORDER — LOSARTAN POTASSIUM 50 MG PO TABS: 50.0000 mg | ORAL_TABLET | Freq: Every day | ORAL | 3 refills | Status: DC

## 2022-06-08 NOTE — Assessment & Plan Note (Signed)
Chronic and stable. No seizures in the past year. Continue keppra  BID.

## 2022-06-08 NOTE — Assessment & Plan Note (Signed)
Patient with uncontrolled HTN. BP 177/72 with repeat 157/64. She is currently on coreg 6.25mg  BID and losartan  daily. Will increase losartan to  daily and will also restart jardiance  daily today.   Plan: -continue coreg -increase losartan -restart jardiance -f/u in 1 month for BP recheck

## 2022-06-08 NOTE — Assessment & Plan Note (Addendum)
Patient with uncontrolled T2DM. She is currently not on any medications due to stoppage of jardiance when she experienced vaginal candida infection after increasing dose from  to  daily. Her A1c was 8.6% about 3 months ago. Repeat A1c 7.7% today. Will plan to restart jardiance  daily today as she tolerated this dose well in the past. She experienced GI side effects with metformin IR in the past. Will start metformin-XR  daily in hopes that she will tolerate this better. Will be seeing podiatry in 2 weeks given high risk feet.  Plan: -restart jardiance  daily -start metformin-XR  daily -repeat A1c in 3 months

## 2022-06-08 NOTE — Assessment & Plan Note (Signed)
Patient living with chronic diastolic HF. She has remained adherent with home coreg and losartan therapy. Jardiance was stopped a few months ago due to candidal vaginitis, which occurred after dose increase. Given resolution of infection, will restart jardiance at  dose. She remains euvolemic on exam today.  Plan: -continue coreg and losartan -restart jardiance  daily

## 2022-06-08 NOTE — Progress Notes (Signed)
   CC: f/u T2DM, HTN  HPI:  [name] is a 89 y.o. female with history listed below presenting to the [facility] for f/u HTN, T2DM. Please see individualized problem based charting for full HPI.  Past Medical History:  Diagnosis Date   Adenocarcinoma of lung     Right upper lobe adenocarcinoma. s/p right lower lobectomy 12/15/10   ARTHRITIS, KNEE 03/24/2006   CHF (congestive heart failure)    CVA (cerebrovascular accident) 2006   right embolic stroke, no residual deficits   Depression 12/02/2010   DIABETES MELLITUS, TYPE II 2000   Diverticulitis    GERD 12/04/2005   Hemangioma of liver 12/02/2010   History of meningioma of the brain 07/21/2010   Per MRI 10/2010 stable.    History of pulmonary embolism 04/06/2013   HYPERLIPIDEMIA 12/04/2005   HYPERTENSION 12/04/2005   Meningioma 07/21/2010   Right upper lobe, Adenocarcinoma of lung    11/09 - BAL reactive findings no malignancy. 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 Last him him 03/2008 when she fell out of care.  Last imagining 2/10 - stable to minimal increase in RUL mass. CT chest 11/12/10 increasing in size PET scan 11/18/10 Interval increase in size and metabolic activity    Rotator cuff arthropathy 05/19/2021    Review of Systems:  Negative aside from that listed in individualized problem based charting.  Physical Exam:  Vitals:   06/08/22 1423 06/08/22 1454  BP: (!) 177/72 (!) 157/64  Pulse: 81 81  Temp: 97.9 F (36.6 C)   TempSrc: Oral   SpO2: 97%   Weight: 194 lb 11.2 oz (88.3 kg)    Physical Exam Constitutional:      Comments: Chronically ill-appearing elderly female, sitting in wheelchair, NAD.  HENT:     Mouth/Throat:     Mouth: Mucous membranes are moist.     Pharynx: Oropharynx is clear. No oropharyngeal exudate.  Eyes:     Extraocular Movements: Extraocular movements intact.      Conjunctiva/sclera: Conjunctivae normal.     Pupils: Pupils are equal, round, and reactive to light.  Cardiovascular:     Rate and Rhythm: Normal rate and regular rhythm.     Heart sounds: Normal heart sounds. No murmur heard.    No friction rub. No gallop.  Pulmonary:     Effort: Pulmonary effort is normal.     Breath sounds: Normal breath sounds. No wheezing, rhonchi or rales.  Abdominal:     General: Bowel sounds are normal. There is no distension.     Palpations: Abdomen is soft.     Tenderness: There is no abdominal tenderness.  Musculoskeletal:        General: No swelling. Normal range of motion.  Skin:    General: Skin is warm and dry.     Comments: Left foot with slightly darkened skin color. L great toe with missing toenail. No obvious wounds or lesions noted. No increased warmth.   Neurological:     Mental Status: She is alert. Mental status is at baseline.  Psychiatric:        Mood and Affect: Mood normal.        Behavior: Behavior normal.      Assessment & Plan:   See Encounters Tab for problem based charting.  Patient discussed with Dr. Mikey Bussing

## 2022-06-08 NOTE — Assessment & Plan Note (Signed)
>>  ASSESSMENT AND PLAN FOR CHRONIC DIASTOLIC CONGESTIVE HEART FAILURE (HCC) WRITTEN ON 06/08/2022  3:14 PM BY EMMY ALF, MD  Patient living with chronic diastolic HF. She has remained adherent with home coreg  and losartan  therapy. Jardiance  was stopped a few months ago due to candidal vaginitis, which occurred after dose increase. Given resolution of infection, will restart jardiance  at 10mg  dose. She remains euvolemic on exam today.  Plan: -continue coreg  and losartan  -restart jardiance  10mg  daily

## 2022-06-08 NOTE — Patient Instructions (Addendum)
[name],  It was a pleasure seeing you in the clinic today.   I have refilled the Tylenol #3 for you. We are reaching out to Care Coordination to see how to get the incontinence supplies delivered to you. I am also inquiring about the mail-order pharmacy for you. I have restarted the jardiance and have also prescribed metformin (low-dose). Please take these tablets once a day to better control your diabetes. I have increased your losartan to  daily for better control of your blood pressure. Please come back in 1 month for a follow up visit.  Please call our clinic at [phone] if you have any questions or concerns. The best time to call is Monday-Friday from 9am-4pm, but there is someone available 24/7 at the same number. If you need medication refills, please notify your pharmacy one week in advance and they will send [place] a request.   Thank you for letting us take part in your care. We look forward to seeing you next time!

## 2022-06-09 ENCOUNTER — Ambulatory Visit: Payer: Self-pay

## 2022-06-09 DIAGNOSIS — E1151 Type 2 diabetes mellitus with diabetic peripheral angiopathy without gangrene: Secondary | ICD-10-CM

## 2022-06-09 DIAGNOSIS — E1159 Type 2 diabetes mellitus with other circulatory complications: Secondary | ICD-10-CM

## 2022-06-09 NOTE — Patient Instructions (Signed)
Visit Information  Thank you for taking time to visit with me today. Please don't hesitate to contact me if I can be of assistance to you.   Following are the goals we discussed today:  -Engage with pharmacy team regarding medication delivery -Engage with [employer] regarding incontinent supply delivery    Our next appointment is by telephone on 06/17/22 at 12:30  Please call the care guide team at [phone] if you need to cancel or reschedule your appointment.   If you are experiencing a Mental Health or Behavioral Health Crisis or need someone to talk to, please call 911  The patient verbalized understanding of instructions, educational materials, and care plan provided today and DECLINED offer to receive copy of patient instructions, educational materials, and care plan.   Bevelyn Ngo, BSW, CDP Social Worker, Certified Dementia Practitioner [facility] Care Management  Care Coordination [phone]

## 2022-06-09 NOTE — Assessment & Plan Note (Signed)
Patient with a history of chronic urinary incontinence, likely a combination of urge, stress, and functional incontinence. She does notice intermittent urine dribbling with coughing/sneezing. Also notes urgency to have urinate right away. Her functional aspect is due to history of multiple comorbid conditions (CVA, physical deconditioning, knee OA, PAD) which limit her ability to make it to the bathroom on time. She has been doing well with incontinence supplies at home per daughter (primary caregiver). They have moved to a motel due to black mold in their previous residence. Requesting incontinence supplies as they have ran out. Paperwork was filled out and sent to [employer] for delivery of incontinence supplies to patient (adult pull-ups, pads).

## 2022-06-09 NOTE — Patient Outreach (Signed)
  Care Coordination   Initial Visit Note   06/09/2022 Name: [name] MRN: [id number] DOB: 1934-02-01  [name] is a 89 y.o. year old female who sees Merrilyn Puma, MD for primary care. I  spoke with patients daughter Asencion Noble by phone to assist with care coordination needs.  What matters to the patients health and wellness today?  Patient needs incontinent supplies delivered to her hotel room    Goals Addressed             This Visit's Progress    Care Coordination Activities - BSW Plan of Care       Care Coordination Interventions: Referral received indicating patient is living in a hotel and needs assistance with medication delivery and obtaining incontinent supplies Determined the patient has lived in a hotel with her daughter since 12/28 due to mold in her apartment - unclear when or if patient will return to her previous living situation Discussed patient has not received incontinent supplies since moving into the hotel; daughter states she stopped delivery as she was not sure supplies could be delivered to a hotel Contacted [employer], spoke with French Ana who confirms supplies can be delivered to a hotel. Unfortunately the patients orders has expired Provided French Ana with current address ([address], [place] [address] Room [phone]) and patients current primary care providers name; French Ana to fax order request for Dr. Austin Miles to complete Advised patients daughter that once the order is completed [employer] will contact her with a delivery date Discussed the patients daughter does not drive and has difficulty accessing medications for the patient. She would like either mail or pharmacy or a local pharmacy that delivers Referral placed to pharmacy to assist with medication accessibility Collaboration with Dr. Austin Miles via in-basket regarding interventions and plan         SDOH assessments and interventions completed:  No     Care Coordination  Interventions:  Yes, provided   Interventions Today    Flowsheet Row Most Recent Value  Chronic Disease   Chronic disease during today's visit Diabetes, Hypertension (HTN)  General Interventions   General Interventions Discussed/Reviewed General Interventions Discussed, Communication with  Communication with PCP/Specialists  [[facility]]  Pharmacy Interventions   Pharmacy Dicussed/Reviewed Referral to Pharmacist  Referral to Pharmacist --  [Barriers with obtaining medications]        Follow up plan: Referral made to pharmacy team Follow up call scheduled for 06/17/22    Encounter Outcome:  Pt. Visit Completed   Bevelyn Ngo, Kenard Gower, CDP Social Worker, Certified Dementia Practitioner [facility] Care Management  Care Coordination [phone]

## 2022-06-11 ENCOUNTER — Telehealth: Payer: Self-pay

## 2022-06-11 NOTE — Progress Notes (Signed)
   Care Guide Note  06/11/2022 Name: LEEANNE BUTTERS MRN: [id number] DOB: [birth year]  Referred by: Merrilyn Puma, MD Reason for referral : Care Coordination (Outreach to schedule referral with Pharm d Thurston Hole )   ONIA SHIFLETT is a 89 y.o. year old female who is a primary care patient of Merrilyn Puma, MD. MAHIKA VANVOORHIS was referred to the pharmacist for assistance related to HTN.    An unsuccessful telephone outreach was attempted today to contact the patient who was referred to the pharmacy team for assistance with medication management. Additional attempts will be made to contact the patient.   Penne Lash, RMA Care Guide [facility]  [place], [place] [address] Direct Dial: [phone] [email or web address]

## 2022-06-12 ENCOUNTER — Ambulatory Visit: Payer: 59 | Attending: Internal Medicine | Admitting: Physical Therapy

## 2022-06-12 DIAGNOSIS — M6281 Muscle weakness (generalized): Secondary | ICD-10-CM | POA: Insufficient documentation

## 2022-06-12 DIAGNOSIS — R2689 Other abnormalities of gait and mobility: Secondary | ICD-10-CM | POA: Diagnosis not present

## 2022-06-12 NOTE — Therapy (Signed)
OUTPATIENT PHYSICAL THERAPY WHEELCHAIR EVALUATION   Patient Name: [name] MRN: [id number] DOB:Jul 05, 1933, 89 y.o., female Today's Date: 06/12/2022  END OF SESSION:  PT End of Session - 06/12/22 1231     Visit Number 1    Number of Visits 1    Date for PT Re-Evaluation 06/12/22    Authorization Type [employer]    PT Start Time 1230           ***  Past Medical History:  Diagnosis Date   Adenocarcinoma of lung (HCC)     Right upper lobe adenocarcinoma. s/p right lower lobectomy 12/15/10   ARTHRITIS, KNEE 03/24/2006   CHF (congestive heart failure) (HCC)    CVA (cerebrovascular accident) (HCC) 2006   right embolic stroke, no residual deficits   Depression 12/02/2010   DIABETES MELLITUS, TYPE II 2000   Diverticulitis    GERD 12/04/2005   Hemangioma of liver 12/02/2010   History of meningioma of the brain 07/21/2010   Per MRI 10/2010 stable.    History of pulmonary embolism 04/06/2013   HYPERLIPIDEMIA 12/04/2005   HYPERTENSION 12/04/2005   Meningioma (HCC) 07/21/2010   Right upper lobe, Adenocarcinoma of lung    11/09 - BAL reactive findings no malignancy. 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 Last him him 03/2008 when she fell out of care.  Last imagining 2/10 - stable to minimal increase in RUL mass. CT chest 11/12/10 increasing in size PET scan 11/18/10 Interval increase in size and metabolic activity    Rotator cuff arthropathy 05/19/2021   Past Surgical History:  Procedure Laterality Date   ABDOMINAL HYSTERECTOMY     CRANIOTOMY N/A 03/02/2013   Procedure: CRANIOTOMY TUMOR EXCISION;  Surgeon: Carmela Hurt, MD;  Location: [facility];  Service: Neurosurgery;  Laterality: N/A;  Bifrontal Craniotomy for tumor   Extracapsular cataract extraction with intraocular     lens implantation.   INCISION AND DRAINAGE PERIRECTAL ABSCESS N/A 01/19/2013   Procedure:  IRRIGATION AND DEBRIDEMENT PERIRECTAL ABSCESS;  Surgeon: Liz Malady, MD;  Location: [facility];  Service: General;  Laterality: N/A;   IR ANGIOGRAM EXTREMITY LEFT  04/02/2022   IR ANGIOGRAM SELECTIVE EACH ADDITIONAL VESSEL  04/02/2022   IR ANGIOGRAM SELECTIVE EACH ADDITIONAL VESSEL  04/02/2022   IR AORTAGRAM ABDOMINAL SERIALOGRAM  04/02/2022   IR FEM POP ART STENT INC PTA MOD SED  04/02/2022   IR ILIAC ART STENT INC PTA MOD SED  04/02/2022   IR RADIOLOGIST EVAL & MGMT  03/12/2022   IR RADIOLOGIST EVAL & MGMT  03/24/2022   IR US GUIDE VASC ACCESS LEFT  04/02/2022   IR US GUIDE VASC ACCESS RIGHT  04/02/2022   RIGHT VATS,RIGHT THORACOTOMY,RIGHT LOWER LOBECTOMY WITH NODE DISSECTION     Video bronchoscope.  12/29/2007   [place]   Wide excision of left upper back mass.     Patient Active Problem List   Diagnosis Date Noted   Candidal vulvovaginitis 03/05/2022   Physical deconditioning 11/25/2021   Lipoma 02/19/2021   CKD stage 3 due to type 2 diabetes mellitus (HCC) 07/05/2018   PAD (peripheral artery disease) (HCC) 06/10/2017   Liver hemangioma 11/13/2016   Obesity (BMI 30.0-34.9) 08/20/2016   Hearing loss 08/20/2016   Osteoarthritis of right knee 10/30/2014   Seizure disorder (HCC) 10/06/2013   Normocytic anemia 04/18/2013   Chronic cough 04/29/2012   Healthcare maintenance 02/24/2011  Depression 12/02/2010   Chronic diastolic congestive heart failure (HCC)    Incontinence of urine 11/29/2007   Type 2 diabetes mellitus (HCC) 12/04/2005   Hyperlipidemia associated with type 2 diabetes mellitus (HCC) 12/04/2005   Hypertension associated with diabetes (HCC) 12/04/2005   GERD 12/04/2005   History of CVA (cerebrovascular accident) 12/02/2002    PCP: Merrilyn Puma, MD  REFERRING PROVIDER: Gust Rung, DO  THERAPY DIAG:  Muscle weakness (generalized)  Other abnormalities of gait and mobility  Rationale for Evaluation and Treatment Habilitation  SUBJECTIVE:                                                                                                                                                                                            SUBJECTIVE STATEMENT: Pt presents for wheelchair evaluation. Pt is accompanied by her daughter and her son-in-law. Per family report pt had a stroke in 2003 with minimal residual L-side weakness. Most recently pt had a stent placed in her LLE in February of 2024. Since this surgery pt has only been walking very short distances in the home with her [facility]. Pt with frequent pain in her RLE from history of a surgically-corrected fracture per pt's family.   PRECAUTIONS: Fall  WEIGHT BEARING RESTRICTIONS No   OCCUPATION: retired  PLOF:  Independent with gait, Independent with transfers, and Requires assistive device for independence  PATIENT GOALS: to obtain a power wheeled mobility device due to decreased endurance and decreased safety/impaired balance leading to increased fall risk and inability to complete MRADLs in a safe and timely manner       MEDICAL HISTORY:  Primary diagnosis onset: Unknown per chart Diagnosis  Code: I50.32 Diagnosis: Chronic diastolic congestive heart failure    Diagnosis code: I73.9     Diagnosis: PAD (peripheral artery disease)   Diagnosis  Code: Z86.73 Diagnosis: History of CVA (cerebrovascular accident) (Chronic)  [] Progressive disease  Relevant future surgeries: N/A    Height: 5'4" Weight: 194 lbs Explain recent changes or trends in weight:  N/A    History:  Past Medical History:  Diagnosis Date   Adenocarcinoma of lung (HCC)     Right upper lobe adenocarcinoma. s/p right lower lobectomy 12/15/10   ARTHRITIS, KNEE 03/24/2006   CHF (congestive heart failure) (HCC)    CVA (cerebrovascular accident) (HCC) 2006   right embolic stroke, no residual deficits   Depression 12/02/2010   DIABETES MELLITUS, TYPE II 2000   Diverticulitis    GERD 12/04/2005   Hemangioma of liver 12/02/2010   History of  meningioma of the brain 07/21/2010   Per MRI 10/2010 stable.    History  of pulmonary embolism 04/06/2013   HYPERLIPIDEMIA 12/04/2005   HYPERTENSION 12/04/2005   Meningioma (HCC) 07/21/2010   Right upper lobe, Adenocarcinoma of lung    11/09 - BAL reactive findings no malignancy. 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 Last him him 03/2008 when she fell out of care.  Last imagining 2/10 - stable to minimal increase in RUL mass. CT chest 11/12/10 increasing in size PET scan 11/18/10 Interval increase in size and metabolic activity    Rotator cuff arthropathy 05/19/2021            Cardio Status:  Functional Limitations: impaired due to CHF  [] Intact  [x]  Impaired      Respiratory Status:  Functional Limitations:   [x] Intact  [] Impaired   [] SOB [] COPD [] O2 Dependent ______LPM  [] Ventilator Dependent  Resp equip:                                                     Objective Measure(s):   Orthotics:   [] Amputee:                                                             [] Prosthesis:        HOME ENVIRONMENT:  [] House [] Condo/town home [] Apartment [] Asst living [] LTCF         [] Own  [x] Rent in hotel currently  [] Lives alone [x] Lives with others -      daughter and SIL                       Hours without assistance: 0  [x] Home is accessible to patient                                 Storage of wheelchair:  [x] In home   [] Other Comments:        COMMUNITY :  TRANSPORTATION:  [x] Car [] Van [] Public Transportation [] Adapted w/c Lift []  Ambulance [] Other:                     [] Sits in wheelchair during transport   Where is w/c stored during transport? N/A currently [] Jean Rosenthal  []  EZ Lock   [] Self-Driver       Drive while in  Wheelchair [] yes [x] no   Employment and/or school:  Specific requirements pertaining to mobility: N/A        Other:  COMMUNICATION:  Verbal Communication  [x] WFL  [] receptive [] WFL [] expressive [] Understandable  [] Difficult to understand  [] non-communicative  Primary Language:____English________ 2nd:_____________  Communication provided by:[x] Patient [x] Family [] Caregiver [] Translator   [] Uses an augmentative communication device     Manufacturer/Model :  MOBILITY/BALANCE:  Sitting Balance  Standing Balance  Transfers  Ambulation   [x] WFL      [] WFL  [x] Independent  []  Independent   [] Uses UE for balance in sitting Comments:  [x] Uses UE/device for stability Comments: SPC []  Min assist  []  Ambulates independently with       device:___________________      []  Mod assist  [x]  Able to ambulate ____10__ feet        safely/functionally/independently   []  Min assist  []  Min assist  []  Max assist  []  Non-functional ambulator         History/High risk of falls   []  Mod assist  []  Mod assist  []  Dependent  []  Unable to ambulate   []  Max  assist  []  Max assist  Transfer method:[] 1 person [] 2 person [] sliding board [] squat pivot [] stand pivot [] mechanical patient lift  [] other:   []  Unable  []  Unable    Fall History: # of falls in the past 6 months? none # of "near" falls in the past 6 months? none    CURRENT SEATING / MOBILITY:  Current Mobility Device: [x] None [] Cane/Walker [] Manual [] Dependent [] Dependent w/ Tilt rScooter  [] Power (type of control):   Manufacturer:  Model:   Serial #:   Size:  Color:  Age:   Purchased by whom:   Current condition of mobility base:    Current seating system:                                                                       Age of seating system:    Describe posture in present seating system:    Is the current mobility meeting medical necessity?:  [] Yes [] No Describe:                                     Ability to complete Mobility-Related Activities of Daily Living (MRADL's) with Current Mobility Device:   Move room to room  [] Independent  [] Min  [] Mod [] Max assist  [x] Unable  Comments: Pt currently only walking short distances in the home with her SPC. Pt is unable to safely and efficiently complete her MRADL's with her SPC.  Meal prep  [] Independent  [] Min [] Mod [] Max assist  [x] Unable    Feeding  [x] Independent  [] Min [] Mod [] Max assist  [] Unable    Bathing  [] Independent  [x] Min [] Mod [] Max assist  [] Unable    Grooming  [] Independent  [x] Min [] Mod [] Max assist  [] Unable    UE dressing  [] Independent  [x] Min [] Mod [] Max assist  [] Unable    LE dressing  [] Independent   [x] Min [] Mod [] Max assist  [] Unable    Toileting  [] Independent  [] Min [] Mod [] Max assist  [x] Unable    Bowel Mgt: []  Continent [x]  Incontinent [x]  Accidents [x]  Diapers []  Colostomy []  Bowel Program:  Bladder Mgt: []  Continent [x]  Incontinent [x]  Accidents [x]  Diapers []  Urinal []  Intermittent Cath []  Indwelling Cath []  Supra-pubic Cath     Current Mobility Equipment Trialed/ Ruled Out:    Does not meet mobility needs due to:    Loraine Leriche all boxes that indicate inability to use the specific equipment listed  Meets needs for safe  independent functional  ambulation  / mobility    Risk of  Falling or History of Falls    Enviromental limitations      Cognition    Safety concerns with  physical ability    Decreased / limitations endurance  & strength     Decreased / limitations  motor skills  & coordination    Pain    Pace /  Speed    Cardiac and/or  respiratory condition    Contra - indicated by diagnosis   Cane/Crutches  []   [x]   []   []   [x]   [x]   []   [x]   [x]   [x]   []    Walker / Rollator  []  NA   []   [x]   []   []   [x]   [x]   []   [x]   [x]   [x]   []     Manual Wheelchair K0001-K0007:  []  NA  []   []   []   []   [x]   [x]   [x]   []   [x]   [x]   []    Manual W/C (K0005) with power assist  []  NA  []   []   []   []   [x]   [x]   [x]   []   [x]   [x]   []    Scooter  []  NA  []   [x]   []   []   [x]   [x]   [x]   []   [x]   [x]   []    Power Wheelchair: standard joystick  []  NA  [x]   []   []    []   []   []   []   []   []   []   []    Power Wheelchair: alternative controls  [x]  NA  []   []   []   []   []   []   []   []   []   []   []    Summary:  The least costly alternative for independent functional mobility was found to be:    []  Crutch/Cane  []  Walker []  Manual w/c  []  Manual w/c with power assist   []  Scooter   [x]  Power w/c std joystick   []  Power w/c alternative control        []  Requires dependent care mobility device   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 for safe mobility equipment operation  [x]   Yes []   No  Patient is willing and motivated to use recommended mobility equipment  [x]   Yes []   No       []  Patient is unable to safely operate mobility equipment independently and requires dependent care equipment Comments:           SENSATION and SKIN ISSUES:  Sensation [x]  Intact  []  Impaired []  Absent []  Hyposensate []  Hypersensate  []  Defensiveness  Location(s) of impairment:    Pressure Relief Method(s):  []  Lean side to side to offload (without risk of falling)  []   W/C push up (4+ times/hour for 15+ seconds) [x]  Stand up (without risk of falling)    []  Other: (Describe): Effective pressure relief method(s) above can be performed consistently throughout the day: rYes  r No If not, Why?:  Skin Integrity Risk:       []  Low risk           [x]  Moderate risk            []  High risk  If high risk, explain:   Skin Issues/Skin Integrity  Current skin Issues  []  Yes [x]  No [x]  Intact  []   Red area   []   Open  area  []  Scar tissue  [x]  At risk from prolonged sitting  Where: sacrum History of Skin Issues  []  Yes [x]  No Where : When: Stage: Hx of skin flap surgeries  []  Yes [x]  No Where:  When:  Pain: [x]  Yes []  No   Pain Location(s): RLE Intensity scale: (0-10) : 8 How does pain interfere with mobility and/or MRADLs? - pain limits distance that pt can ambulate with her SPC and prevents her from standing up and transferring herself to  the bathroom when she needs to toilet. Pt limits her mobility and the # of times that she gets up and moves around during the day because of her pain.        MAT EVALUATION:  Neuro-Muscular Status: (Tone, Reflexive, Responses, etc.)     [x]   Intact   []  Spasticity:  []  Hypotonicity  []  Fluctuating  []  Muscle Spasms  []  Poor Righting Reactions/Poor Equilibrium Reactions  []  Primal Reflex(s):    Comments:            COMMENTS:    POSTURE:     Comments:  Pelvis Anterior/Posterior:  []  Neutral   [x]  Posterior  []  Anterior  []  Fixed - No movement [x]  Tendency away from neutral []  Flexible []  Self-correction []  External correction Obliquity (viewed from front)  [x]  WFL []  R Obliquity []  L Obliquity  []  Fixed - No movement []  Tendency away from neutral []  Flexible []  Self-correction []  External correction Rotation  [x]  WFL []  R anterior []  L anterior  []  Fixed - No movement []  Tendency away from neutral []  Flexible []  Self-correction []  External correction Tonal Influence Pelvis:  [x]  Normal []  Flaccid []  Low tone []  Spasticity []  Dystonia []  Pelvis thrust []  Other:    Trunk Anterior/Posterior:  []  WFL [x]  Thoracic kyphosis []  Lumbar lordosis  []  Fixed - No movement [x]  Tendency away from neutral []  Flexible []  Self-correction []  External correction  [x]  WFL []  Convex to left  []  Convex to right []  S-curve   []  C-curve []  Multiple curves []  Tendency away from neutral []  Flexible []  Self-correction []  External correction Rotation of shoulders and upper trunk:  [x]  Neutral []  Left-anterior []  Right- anterior []  Fixed- no movement []  Tendency away from neutral []  Flexible []  Self correction []  External correction Tonal influence Trunk:  [x]  Normal []  Flaccid []  Low tone []  Spasticity []  Dystonia []  Other:   Head & Neck  [x]  Functional []  Flexed    []  Extended []  Rotated right  []  Rotated left []  Laterally flexed right []   Laterally flexed left []  Cervical hyperextension   [x]  Good head control []  Adequate head control []  Limited head control []  Absent head control Describe tone/movement of head and neck: [facility]     Lower Extremity Measurements: LE ROM:  Active ROM Right 06/12/2022 Left 06/12/2022  Hip flexion    Hip extension    Hip abduction    Hip adduction    Knee flexion    Knee extension Tight HS Tight HS  Ankle dorsiflexion    Ankle plantarflexion     (Blank rows = not tested)  LE MMT:  MMT Right 06/12/2022 Left 06/12/2022  Hip flexion 4 4  Hip extension    Hip abduction    Hip adduction    Knee flexion 3 3  Knee extension 4 4  Ankle dorsiflexion 4 5  Ankle plantarflexion     (Blank rows = not tested)  Hip positions:  [x]  Neutral   []   Abducted   []  Adducted  []  Subluxed   []  Dislocated   []  Fixed   []  Tendency away from neutral []  Flexible []  Self-correction []  External correction   Hip Windswept:[x]  Neutral  []  Right    []  Left  []  Subluxed   []  Dislocated   []  Fixed   []  Tendency away from neutral []  Flexible []  Self-correction []  External correction  LE Tone: [x]  Normal []  Low tone []  Spasticity []  Flaccid []  Dystonia []  Rocks/Extends at hip []  Thrust into knee extension []  Pushes legs downward into footrest  Foot positioning: ROM Concerns: Dorsiflexed: []  Right   []  Left Plantar flexed: []  Right    []  Left Inversion: []  Right    []  Left Eversion: []  Right    []  Left  LE Edema: []  1+ (Barely detectable impression when finger is pressed into skin) [x]  2+ (slight indentation. 15 seconds to rebound) []  3+ (deeper indentation. 30 seconds to rebound) []  4+ (>30 seconds to rebound)  Edema in BLE, wears compression stockings  UE Measurements:  UPPER EXTREMITY ROM:   Active ROM Right 06/12/2022 Left 06/12/2022  Shoulder flexion [facility]  Shoulder abduction [facility]  Shoulder adduction    Elbow flexion    Elbow extension    Wrist flexion    Wrist  extension    (Blank rows = not tested)  UPPER EXTREMITY MMT:  MMT Right 06/12/2022 Left 06/12/2022  Shoulder flexion [facility]  Shoulder abduction [facility]  Shoulder adduction    Elbow flexion WFL WFL  Elbow extension [facility] WFL  Wrist flexion    Wrist extension    Pinch strength    Grip strength    (Blank rows = not tested)  Shoulder Posture:  Right Tendency towards Left  []   Functional []    []   Elevation []    []   Depression []    [x]   Protraction [x]    []   Retraction []    []   Internal rotation []    []   External rotation []    []   Subluxed []     UE Tone: [x]  Normal []  Flaccid []  Low tone []  Spasticity  []  Dystonia []  Other:   UE Edema: []  1+ (Barely detectable impression when finger is pressed into skin) []  2+ (slight indentation. 15 seconds to rebound) []  3+ (deeper indentation. 30 seconds to rebound) []  4+ (>30 seconds to rebound)  Wrist/Hand: Handedness: []  Right   [x]  Left   []  NA: Comments:  Right  Left  [x]   WNL [x]    []   Limitations []    []   Contractures []    []   Fisting []    []   Tremors []    []   Weak grasp []    []   Poor dexterity []    []   Hand movement non functional []    []   Paralysis []         MOBILITY BASE RECOMMENDATIONS and JUSTIFICATION:  MOBILITY BASE  JUSTIFICATION   Manufacturer:   Pride Model:      [id number]                        Color:  Seat Width:  18" Seat Depth : 18"   []  Manual mobility base (continue below)   []  Scooter/POV  [x]  Power mobility base   Number of hours per day spent in above selected mobility base: ***  Typical daily mobility base use Schedule: ***   []  is not a safe, functional ambulator  [x]  limitation prevents  from completing a MRADL(s) within a reasonable time frame    []  limitation places at high risk of morbidity or mortality secondary to  the attempts to perform a    MRADL(s)  []  limitation prevents accomplishing a MRADL(s) entirely  [x]  provide independent mobility  [x]  equipment is a lifetime  medical need  [x]  walker or cane inadequate  [x]  any type manual wheelchair      inadequate  [x]  scooter/POV inadequate      []  requires dependent mobility            POWER MOBILITY      []  Scooter/POV    []  can safely operate   []  can safely transfer   []  has adequate trunk stability   []  cannot functionally propel  manual wheelchair    [x]  Power mobility base    []  non-ambulatory   [x]  cannot functionally propel manual wheelchair   [x]  cannot functionally and safely      operate scooter/POV  [x]  can safely operate power       wheelchair  [x]  home is accessible  [x]  willing to use power wheelchair     Tilt  []  Powered tilt on powered chair  []  Powered tilt on manual chair  []  Manual tilt on manual chair Comments:  []  change position for pressure      []  elief/cannot weight shift   []  change position against      gravitational force on head and      shoulders   []  decrease pain  []  blood pressure management   []  control autonomic dysreflexia  []  decrease respiratory distress  []  management of spasticity  []  management of low tone  []  facilitate postural control   []  rest periods   []  control edema  []  increase sitting tolerance   []  aid with transfers     Recline   []  Power recline on power chair  []  Manual recline on manual chair  Comments:    []  intermittent catheterization  []  manage spasticity  []  accommodate femur to back angle  []  change position for pressure relief/cannot weight shift rhigh risk of pressure sore development  []  tilt alone does not accomplish     effective pressure relief, maximum pressure relief achieved at -      _______ degrees tilt   _______ degrees recline   []  difficult to transfer to and from bed []  rest periods and sleeping in chair  []  repositioning for transfers  []  bring to full recline for ADL care  []  clothing/diaper changes in chair  []  gravity PEG tube feeding  []  head positioning  []  decrease pain  []  blood pressure  management   []  control autonomic dysreflexia  []  decrease respiratory distress  []  user on ventilator     Elevator on mobility base  []  Power wheelchair  []  Scooter  []  increase Indep in transfers   []  increase Indep in ADLs    []  bathroom function and safety  []  kitchen/cooking function and safety  []  shopping  []  raise height for communication at standing level  []  raise height for eye contact which reduces cervical neck strain and pain  []  drive at raised height for safety and navigating crowds  []  Other:   []  Vertical position system  (anterior tilt)     (Drive locks-out)    []  Stand       (Drive enabled)  []  independent weight bearing  []  decrease joint contractures  []  decrease/manage  spasticity  []  decrease/manage spasms  []  pressure distribution away from   scapula, sacrum, coccyx, and ischial tuberosity  []  increase digestion and elimination   []  access to counters and cabinets  []  increase reach  []  increase interaction with others at eye level, reduces neck strain  []  increase performance of       MRADL(s)      Power elevating legrest    []  Center mount (Single) 85-170 degrees       []  Standard (Pair) 100-170 degrees  []  position legs at 90 degrees, not available with std power ELR  []  center mount tucks into chair to decrease turning radius in home, not available with std power ELR  []  provide change in position for LE  []  elevate legs during recline    []  maintain placement of feet on      footplate  []  decrease edema  []  improve circulation  []  actuator needed to elevate legrest  []  actuator needed to articulate legrest preventing knees from flexing  []  Increase ground clearance over      curbs  []   STD (pair) independently                     elevate legrest   POWER WHEELCHAIR CONTROLS      Controls/input device  []  Expandable  []  Non-expandable  []  Proportional  []  Right Hand []  Left Hand  []  Non-proportional/switches/head-array  []   Electrical/proximity         []   Mechanical      Manufacturer:___________________   Type:________________________ []  provides access for controlling wheelchair  []  programming for accurate control  []  progressive disease/changing condition  []  required for alternative drive      controls       []  lacks motor control to operate  proportional drive control  []  unable to understand proportional controls  []  limited movement/strength  []  extraneous movement / tremors / ataxic / spastic       []  Upgraded electronics controller/harness    []  Single power (tilt or recline)   []  Expandable    []  Non-expandable plus   []  Multi-power (tilt, recline, power legrest, power seat lift, vertical positioning system, stand)  []  allows input device to communicate with drive motors  []  harness provides necessary connections between the controller, input device, and seat functions     []  needed in order to operate power seat functions through joystick/ input device  []  required for alternative drive controls     []  Enhanced display  []  required to connect all alternative drive controls   []  required for upgraded joystick      (lite-throw, heavy duty, micro)  []  Allows user to see in which mode and drive the wheelchair is set; necessary for alternate controls       []  Upgraded tracking electronics  []  correct tracking when on uneven surfaces makes switch driving more efficient and less fatiguing  []  increase safety when driving  []  increase ability to traverse thresholds    []  Safety / reset / mode switches     Type:    []  Used to change modes and stop the wheelchair when driving     [x]  Mount for joystick / input device/switches  [x]  swing away for access or transfers   [x]  attaches joystick / input device / switches to wheelchair   [x]  provides for consistent access  []  midline for optimal placement    []  Attendant controlled joystick plus  mount  []  safety  []  long distance driving  []   operation of seat functions  []  compliance with transportation regulations    [x]  Battery  [x]  required to power (power assist / scooter/ power wc / other):   []  Power inverter (24V to 12V)  []  required for ventilator / respiratory equipment / other:     CHAIR OPTIONS MANUAL & POWER      Armrests   [x]  adjustable height []  removable  []  swing away []  fixed  [x]  flip back  []  reclining  [x]  full length pads []  desk []  tube arms []  gel pads  [x]  provide support with elbow at 90    [x]  remove/flip back/swing away for  transfers  [x]  provide support and positioning of upper body    []  allow to come closer to table top  []  remove for access to tables  []  provide support for w/c tray  [x]  change of height/angles for       variable activities   []  Elbow support / Elbow stop  []  keep elbow positioned on arm pad  []  keep arms from falling off arm pad  during tilt and/or recline   Upper Extremity Support  []  Arm trough  []   R  []   L  Style:  []  swivel mount []  fixed mount   []  posterior hand support  []   tray  []  full tray  []  joystick cut out  []   R  []   L  Style:  []  decrease gravitational pull on      shoulders  []  provide support to increase UE  function  []  provide hand support in natural    position  []  position flaccid UE  []  decrease subluxation    []  decrease edema       []  manage spasticity   []  provide midline positioning  []  provide work surface  []  placement for AAC/ Computer/ EADL       Hangers/ Legrests   []  ______ degree  []  Elevating []  articulating  []  swing away []  fixed []  lift off  []  heavy duty []  adjustable knee angle  []  adjustable calf panel   []  longer extension tube              []  provide LE support  []  maintain placement of feet on      footplate   []  accommodate lower leg length  []  accommodate to hamstring       tightness  []  enable transfers  []  provide change in position for LE's  []  elevate legs during recline    []  decrease edema  []   durability      Foot support   [x]  footplate []  R []  L [x]  flip up           []  Depth adjustable   []  angle adjustable  []  foot board/one piece    [x]  provide foot support  []  accommodate to ankle ROM  []  allow foot to go under wheelchair base  [x]  enable transfers     []  Shoe holders  []  position foot    []  decrease / manage spasticity  []  control position of LE  []  stability    []  safety     []  Ankle strap/heel      loops  []  support foot on foot support  []  decrease extraneous movement  []  provide input to heel   []  protect foot     []  Amputee adapter []  R  []   L     Style:                  Size:  []  Provide support for stump/residual extremity    []  Transportation tie-down  []  to provide crash tested tie-down brackets    [x]  Crutch/cane holder    []  O2 holder    []  IV hanger   []  Ventilator tray/mount    [x]  stabilize accessory on wheelchair       Component  Justification     []  Seat cushion      []  accommodate impaired sensation  []  decubitus ulcers present or history  []  unable to shift weight  []  increase pressure distribution  []  prevent pelvic extension  []  custom required "off-the-shelf"    seat cushion will not accommodate deformity  []  stabilize/promote pelvis alignment  []  stabilize/promote femur alignment  []  accommodate obliquity  []  accommodate multiple deformity  []  incontinent/accidents  []  low maintenance     []  seat mounts                 []  fixed []  removable  []  attach seat platform/cushion to wheelchair frame    []  Seat wedge    []  provide increased aggressiveness of seat shape to decrease sliding  down in the seat  []  accommodate ROM        []  Cover replacement   []  protect back or seat cushion  []  incontinent/accidents    []  Solid seat / insert    []  support cushion to prevent      hammocking  []  allows attachment of cushion to mobility base    []  Lateral pelvic/thigh/hip     support (Guides)     []  decrease abduction  []  accommodate pelvis   []  position upper legs  []  accommodate spasticity  []  removable for transfers     []  Lateral pelvic/thigh      supports mounts  []  fixed   []  swing-away   []  removable  []  mounts lateral pelvic/thigh supports     []  mounts lateral pelvic/thigh supports swing-away or removable for transfers    []  Medial thigh support (Pommel)  [] decrease adduction  [] accommodate ROM  []  remove for transfers   []  alignment      []  Medial thigh   []  fixed      support mounts      []  swing-away   []  removable  []  mounts medial thigh supports   []  Mounts medial supports swing- away or removable for transfers       Component  Justification   []  Back       []  provide posterior trunk support []  facilitate tone  []  provide lumbar/sacral support []  accommodate deformity  []  support trunk in midline   []  custom required "off-the-shelf" back support will not accommodate deformity   []  provide lateral trunk support []  accommodate or decrease tone            []  Back mounts  []  fixed  []  removable  []  attach back rest/cushion to wheelchair frame   []  Lateral trunk      supports  []  R []  L  []  decrease lateral trunk leaning  []  accommodate asymmetry    []  contour for increased contact  []  safety    []  control of tone    []  Lateral trunk      supports mounts  []  fixed  []  swing-away   []  removable  []  mounts lateral trunk supports     []   Mounts lateral trunk supports swing-away or removable for transfers   []  Anterior chest      strap, vest     []  decrease forward movement of shoulder  []  decrease forward movement of trunk  []  safety/stability  []  added abdominal support  []  trunk alignment  []  assistance with shoulder control   []  decrease shoulder elevation    []  Headrest      []  provide posterior head support  []  provide posterior neck support  []  provide lateral head support  []  provide anterior head support  []  support during tilt and recline  []  improve feeding     []  improve respiration  []   placement of switches  []  safety    []  accommodate ROM   []  accommodate tone  []  improve visual orientation   []  Headrest           []  fixed []  removable []  flip down      Mounting hardware   []  swing-away laterals/switches  []  mount headrest   []  mounts headrest flip down or  removable for transfers  []  mount headrest swing-away laterals   []  mount switches     []  Neck Support    []  decrease neck rotation  []  decrease forward neck flexion   Pelvic Positioner    [x]  std hip belt          []  padded hip belt  []  dual pull hip belt  []  four point hip belt  []  stabilize tone  [x]  decrease falling out of chair  []  prevent excessive extension  []  special pull angle to control      rotation  []  pad for protection over boney   prominence  []  promote comfort    []  Essential needs        bag/pouch   []  medicines []  special food rorthotics []  clothing changes  []  diapers  []  catheter/hygiene []  ostomy supplies   The above equipment has a life- long use expectancy.  Growth and changes in medical and/or functional conditions would be the exceptions.   SUMMARY:  Why mobility device was selected; include why a lower level device is not appropriate: ***  ASSESSMENT:  CLINICAL IMPRESSION: Patient is a 89 y.o. female who was seen today for physical therapy evaluation and treatment for ***.    OBJECTIVE IMPAIRMENTS {opptimpairments:25111}.   ACTIVITY LIMITATIONS {activitylimitations:27494}  PARTICIPATION LIMITATIONS: {participationrestrictions:25113}  PERSONAL FACTORS {Personal factors:25162} are also affecting patient's functional outcome.   REHAB POTENTIAL: {rehabpotential:25112}  CLINICAL DECISION MAKING: {clinical decision making:25114}  EVALUATION COMPLEXITY: {Evaluation complexity:25115}                                   GOALS: One time visit. No goals established.    PLAN: PT FREQUENCY: one time visit    Peter Congo, PT, DPT, CSRS 06/12/2022, 12:32  PM    I concur with the above findings and recommendations of the therapist:  Physician name printed:         Physician's signature:      Date:

## 2022-06-12 NOTE — Progress Notes (Signed)
Internal Medicine Clinic Attending  Case discussed with the resident at the time of the visit.  We reviewed the resident's history and exam and pertinent patient test results.  I agree with the assessment, diagnosis, and plan of care documented in the resident's note.  

## 2022-06-17 ENCOUNTER — Telehealth: Payer: Self-pay

## 2022-06-17 DIAGNOSIS — I5032 Chronic diastolic (congestive) heart failure: Secondary | ICD-10-CM | POA: Diagnosis not present

## 2022-06-17 DIAGNOSIS — I739 Peripheral vascular disease, unspecified: Secondary | ICD-10-CM | POA: Diagnosis not present

## 2022-06-17 DIAGNOSIS — M1711 Unilateral primary osteoarthritis, right knee: Secondary | ICD-10-CM | POA: Diagnosis not present

## 2022-06-17 NOTE — Patient Outreach (Signed)
  Care Coordination   06/17/2022 Name: BRITNEE MCDEVITT MRN: [id number] DOB: 08/04/1933   Care Coordination Outreach Attempts:  An unsuccessful telephone outreach was attempted for a scheduled appointment today.  Follow Up Plan:  Additional outreach attempts will be made to offer the patient care coordination information and services.   Encounter Outcome:  No Answer   Care Coordination Interventions:  No, not indicated    Bevelyn Ngo, BSW, CDP Social Worker, Certified Dementia Practitioner [facility] Care Management  Care Coordination [phone]

## 2022-06-18 NOTE — Progress Notes (Signed)
   Care Guide Note  06/18/2022 Name: [name] MRN: [id number] DOB: 1933/05/23  Referred by: Merrilyn Puma, MD Reason for referral : Care Coordination (Outreach to schedule referral with Pharm d Thurston Hole )   [name] is a 89 y.o. year old female who is a primary care patient of Merrilyn Puma, MD. [name] was referred to the pharmacist for assistance related to HTN and DM.    A second unsuccessful telephone outreach was attempted today to contact the patient who was referred to the pharmacy team for assistance with medication management. Additional attempts will be made to contact the patient.  Penne Lash, RMA Care Guide [facility]  [place], [place] [address] Direct Dial: [phone] [email or web address]

## 2022-06-25 ENCOUNTER — Encounter: Payer: Self-pay | Admitting: Podiatry

## 2022-06-25 ENCOUNTER — Ambulatory Visit (INDEPENDENT_AMBULATORY_CARE_PROVIDER_SITE_OTHER): Payer: 59 | Admitting: Podiatry

## 2022-06-25 DIAGNOSIS — B351 Tinea unguium: Secondary | ICD-10-CM

## 2022-06-25 DIAGNOSIS — M79674 Pain in right toe(s): Secondary | ICD-10-CM | POA: Diagnosis not present

## 2022-06-25 DIAGNOSIS — Z794 Long term (current) use of insulin: Secondary | ICD-10-CM

## 2022-06-25 DIAGNOSIS — E1151 Type 2 diabetes mellitus with diabetic peripheral angiopathy without gangrene: Secondary | ICD-10-CM | POA: Diagnosis not present

## 2022-06-25 DIAGNOSIS — M79675 Pain in left toe(s): Secondary | ICD-10-CM

## 2022-06-25 NOTE — Progress Notes (Signed)
This patient returns to my office for at risk foot care.  This patient requires this care by a professional since this patient will be at risk due to having diabetes and coagulation defect.  This patient is unable to cut nails herself since the patient cannot reach her nails.These nails are painful walking and wearing shoes.  This patient presents for at risk foot care today.  General Appearance  Alert, conversant and in no acute stress.  Vascular  Dorsalis pedis and posterior tibial  pulses are  weakly palpable  bilaterally.  Capillary return is within normal limits  bilaterally. Temperature is within normal limits  bilaterally.  Neurologic  Senn-Weinstein monofilament wire test within normal limits  bilaterally. Muscle power within normal limits bilaterally.  Nails Thick disfigured discolored nails with subungual debris  from hallux to fifth toes bilaterally. No evidence of bacterial infection or drainage bilaterally.  Orthopedic  No limitations of motion  feet .  No crepitus or effusions noted.  No bony pathology or digital deformities noted.  Skin  normotropic skin with no porokeratosis noted bilaterally.  No signs of infections or ulcers noted.     Onychomycosis  Pain in right toes  Pain in left toes  Consent was obtained for treatment procedures.   Mechanical debridement of nails 1-5  bilaterally performed with a nail nipper.  Filed with dremel without incident.    Return office visit  3 months                    Told patient to return for periodic foot care and evaluation due to potential at risk complications.   Devina Bezold DPM   

## 2022-06-26 NOTE — Progress Notes (Signed)
   Care Guide Note  06/26/2022 Name: [name] MRN: [id number] DOB: 11-Feb-1934  Referred by: Merrilyn Puma, MD Reason for referral : Care Coordination (Outreach to schedule referral with Pharm d Thurston Hole )   [name] is a 89 y.o. year old female who is a primary care patient of Merrilyn Puma, MD. [name] was referred to the pharmacist for assistance related to DM.    Successful contact was made with the patient to discuss pharmacy services including being ready for the pharmacist to call at least 5 minutes before the scheduled appointment time, to have medication bottles and any blood sugar or blood pressure readings ready for review. The patient agreed to meet with the pharmacist via with the pharmacist via telephone visit on (date/time).  06/30/2022  [name], RMA Care Guide [facility]  [place], [place] [address] Direct Dial: [phone] [name]

## 2022-06-29 ENCOUNTER — Telehealth: Payer: Self-pay | Admitting: *Deleted

## 2022-06-29 NOTE — Progress Notes (Signed)
  Care Coordination Note  06/29/2022 Name: [name] MRN: [id number] DOB: 06-23-1933  [name] is a 89 y.o. year old female who is a primary care patient of Merrilyn Puma, MD and is actively engaged with the care management team. I reached out to Coral Else by phone today to assist with re-scheduling a follow up visit with the BSW  Follow up plan: Unsuccessful telephone outreach attempt made. A HIPAA compliant phone message was left for the patient providing contact information and requesting a return call.   [facility]  Care Coordination Care Guide  Direct Dial: [phone]

## 2022-06-30 ENCOUNTER — Other Ambulatory Visit: Payer: 59 | Admitting: Pharmacist

## 2022-06-30 NOTE — Progress Notes (Unsigned)
07/01/2022 Name: [name] MRN: [id number] DOB: 22-Sep-1933  Chief Complaint  Patient presents with   Medication Assistance    [name] is a 89 y.o. year old female who presented for a telephone visit.   They were referred to the pharmacist by their Case Management Team  for assistance in managing medication access. Patient's daughter, Etheleen Sia, provides the history for today's call.  Patient is participating in a Managed Medicaid Plan: dual medicare/medicaid  Subjective:  Care Team: Primary Care Provider: Merrilyn Puma, MD    Medication Access/Adherence  Current Pharmacy:  [facility] [phone] G[place] Otto, [place] - [address] [place] [address] Phone: [phone] Fax: [phone]  [facility] - [place], [place] - [address] [facility] [address] [place] [address] Phone: [phone] Fax: [phone]   Patient reports affordability concerns with their medications: No  Patient reports access/transportation concerns to their pharmacy: Yes  Patient reports adherence concerns with their medications:  Yes  some confusion with patient when daughter goes to work    Diabetes:  Current medications: jardiance 10mg  daily, metformin XR 500mg  daily    Patient denies hypoglycemic s/sx including dizziness, shakiness, sweating. Patient denies hyperglycemic symptoms including polyuria, polydipsia, polyphagia, nocturia, neuropathy, blurred vision.   Current medication access support: none at present, no cost barriers per review with patient's daughter  Medication Management:  Current adherence strategy: pill box  Patient reports Fair adherence to medications - patient does well except there are some issues with patient getting confused when daughter goes to work    Objective:  Lab Results  Component Value Date   HGBA1C 7.7 (A) 06/08/2022    Lab Results  Component Value  Date   CREATININE 0.89 05/24/2022   BUN 9 05/24/2022   NA 142 05/24/2022   K 2.8 (L) 05/24/2022   CL 105 05/24/2022   CO2 29 05/24/2022    Lab Results  Component Value Date   CHOL 180 01/04/2018   HDL 47 01/04/2018   LDLCALC 108 (H) 01/04/2018   TRIG 125 01/04/2018   CHOLHDL 3.8 01/04/2018    Medications Reviewed Today     Reviewed by Gabriel Carina, RPH (Pharmacist) on 06/30/22 at 1446  Med List Status: <None>   Medication Order Taking? Sig Documenting Provider Last Dose Status Informant  Accu-Chek FastClix Lancets MISC [id number] Yes CHECK BLOOD SUGAR THREE TIMES A DAY. DIAG CODE E11.9. INSULIN DEPENDENT Merrilyn Puma, MD Taking Active   acetaminophen-codeine (TYLENOL #3) 300-30 MG tablet [id number] No TAKE 1 TABLET BY MOUTH DAILY AS NEEDED FOR MODERATE PAIN OR SEVERE PAIN.  Patient not taking: Reported on 06/30/2022   Merrilyn Puma, MD Not Taking Active   aspirin EC 81 MG tablet [id number] Yes Take 1 tablet (81 mg total) by mouth daily. Elige Radon, MD Taking Active   barrier cream (NON-SPECIFIED) CREA [id number] No Apply 1 application topically 2 (two) times daily as needed (place on vulva twice daily as needed for redness and swelling).  Patient not taking: Reported on 06/30/2022   Merrilyn Puma, MD Not Taking Active   Blood Glucose Monitoring Suppl (ACCU-CHEK GUIDE) w/Device KIT [id number] Yes 1 each by Does not apply route 3 (three) times daily. Merrilyn Puma, MD Taking Active   carvedilol (COREG) 6.25 MG tablet [id number] Yes TAKE 1 TABLET BY MOUTH 2 TIMES DAILY WITH A MEAL. Merrilyn Puma, MD Taking Active   cilostazol (PLETAL) 100 MG tablet [id number] Yes TAKE  1 TABLET BY MOUTH TWICE A DAY Merrilyn Puma, MD Taking Active   clopidogrel (PLAVIX) 75 MG tablet [id number] Yes Take 1 tablet (75 mg total) by mouth daily. Lynnette Caffey A, PA-C Taking Active   empagliflozin (JARDIANCE) 10 MG TABS tablet [id number] Yes Take 1 tablet (10 mg total) by mouth daily. Merrilyn Puma, MD Taking Active   FLUoxetine (PROZAC) 10 MG capsule [id number] Yes TAKE 1 CAPSULE BY MOUTH EVERY DAY Merrilyn Puma, MD Taking Active   glucose blood (ACCU-CHEK GUIDE) test strip [id number] Yes Check blood sugar 3 times per day Merrilyn Puma, MD Taking Active   Incontinence Supplies MISC [id number] Yes For use to address bowel and bladder incontinence. Ruben Im, MD Taking Active Child  Insulin Pen Needle (BD PEN NEEDLE NANO U/F) 32G X 4 MM MISC [id number] No USE TO ADMINISTER SOLIQUA ONE TIME A DAY  Patient not taking: Reported on 06/30/2022   Merrilyn Puma, MD Not Taking Active   Insulin Pen Needle 32G X 4 MM MISC [id number] Yes Use to administer soliqua one time a day Merrilyn Puma, MD Taking Active   levETIRAcetam (KEPPRA) 750 MG tablet [id number] Yes TAKE 1 TABLET BY MOUTH 2 TIMES DAILY. Merrilyn Puma, MD Taking Active   losartan (COZAAR) 50 MG tablet [id number] No Take 1 tablet (50 mg total) by mouth daily. Merrilyn Puma, MD Unknown Active   metFORMIN (GLUCOPHAGE-XR) 500 MG 24 hr tablet [id number] Yes Take 1 tablet (500 mg total) by mouth daily with breakfast. Merrilyn Puma, MD Taking Active   omeprazole (PRILOSEC) 20 MG capsule [id number] Yes Take 1 capsule (20 mg total) by mouth daily. Belva Agee, MD Taking Active   rosuvastatin (CRESTOR) 20 MG tablet [id number] Yes TAKE 1 TABLET BY MOUTH EVERY DAY Merrilyn Puma, MD Taking Active               Assessment/Plan:   Diabetes: - Currently uncontrolled, but medication adjustments are in place for optimizing control - Reviewed long term cardiovascular and renal outcomes of uncontrolled blood sugar - Recommend to continue current medications, reviewed and clarified recent medication changes implemented by PCP.   Medication Management: - Currently strategy sufficient to maintain appropriate adherence to prescribed medication regimen - Suggested use of weekly pill box to organize medications - Patient/daughter  requesting mail order. Collaborated with [facility] to arrange mail order to address on file - was unable to set up for delivery to patients temporary location of hotel address.    Follow Up Plan: 1-2 days to notify patient & daughter of mail order process  Lynnda Shields, PharmD, BCPS Clinical Pharmacist [facility]

## 2022-07-01 ENCOUNTER — Other Ambulatory Visit (HOSPITAL_COMMUNITY): Payer: Self-pay

## 2022-07-03 NOTE — Patient Outreach (Signed)
  Care Coordination    07/03/2022 Name: [name] MRN: [id number] DOB: 1933/12/27  [name] is a 89 y.o. year old female who sees Merrilyn Puma, MD for primary care. I  performed a discipline closure due to inability to maintain patient contact.   Goals Addressed             This Visit's Progress    COMPLETED: Care Coordination Activities - BSW Plan of Care       Care Coordination Interventions: Performed chart review to note patient is now engaged with pharmacist to assist with medication access Noted patients provider has submitted new incontinent supply order to [employer] Discipline closure due to inability to remain engaged with the patient        SDOH assessments and interventions completed:  No     Care Coordination Interventions:  Yes, provided   Interventions Today    Flowsheet Row Most Recent Value  General Interventions   General Interventions Discussed/Reviewed Communication with  Communication with PCP/Specialists        Follow up plan: No further intervention required.   Encounter Outcome:  Pt. Visit Completed   Bevelyn Ngo, BSW, CDP Social Worker, Certified Dementia Practitioner [facility] Care Management  Care Coordination [phone]

## 2022-07-03 NOTE — Progress Notes (Signed)
  Care Coordination Note  07/03/2022 Name: [name] MRN: [id number] DOB: 01-Nov-1933  [name] is a 89 y.o. year old female who is a primary care patient of Merrilyn Puma, MD and is actively engaged with the care management team. I reached out to Coral Else by phone today to assist with re-scheduling a follow up visit with the BSW  Follow up plan: Unsuccessful telephone outreach attempt made. A HIPAA compliant phone message was left for the patient providing contact information and requesting a return call.  We have been unable to make contact with the patient for follow up. The care management team is available to follow up with the patient after provider conversation with the patient regarding recommendation for care management engagement and subsequent re-referral to the care management team.   [facility] Coordination Care Guide  Direct Dial: [phone]

## 2022-07-04 ENCOUNTER — Other Ambulatory Visit: Payer: Self-pay | Admitting: Student

## 2022-07-04 DIAGNOSIS — E1151 Type 2 diabetes mellitus with diabetic peripheral angiopathy without gangrene: Secondary | ICD-10-CM

## 2022-07-04 DIAGNOSIS — I10 Essential (primary) hypertension: Secondary | ICD-10-CM

## 2022-07-04 DIAGNOSIS — B3731 Acute candidiasis of vulva and vagina: Secondary | ICD-10-CM

## 2022-07-06 ENCOUNTER — Encounter: Payer: Self-pay | Admitting: Student

## 2022-07-06 ENCOUNTER — Ambulatory Visit (INDEPENDENT_AMBULATORY_CARE_PROVIDER_SITE_OTHER): Payer: 59 | Admitting: Student

## 2022-07-06 ENCOUNTER — Ambulatory Visit (INDEPENDENT_AMBULATORY_CARE_PROVIDER_SITE_OTHER): Payer: 59

## 2022-07-06 VITALS — BP 150/59 | HR 72 | Temp 98.0°F | Ht 64.0 in | Wt 181.3 lb

## 2022-07-06 VITALS — BP 153/53 | HR 73 | Temp 98.0°F | Ht 64.0 in | Wt 181.3 lb

## 2022-07-06 DIAGNOSIS — I152 Hypertension secondary to endocrine disorders: Secondary | ICD-10-CM | POA: Diagnosis not present

## 2022-07-06 DIAGNOSIS — E1159 Type 2 diabetes mellitus with other circulatory complications: Secondary | ICD-10-CM | POA: Diagnosis not present

## 2022-07-06 DIAGNOSIS — E1151 Type 2 diabetes mellitus with diabetic peripheral angiopathy without gangrene: Secondary | ICD-10-CM

## 2022-07-06 DIAGNOSIS — Z0000 Encounter for general adult medical examination without abnormal findings: Secondary | ICD-10-CM

## 2022-07-06 DIAGNOSIS — Z7984 Long term (current) use of oral hypoglycemic drugs: Secondary | ICD-10-CM

## 2022-07-06 DIAGNOSIS — E876 Hypokalemia: Secondary | ICD-10-CM | POA: Diagnosis not present

## 2022-07-06 NOTE — Assessment & Plan Note (Signed)
ED visit on 4/7 showed potassium 2.8, was given potassium 40 mEq. In February, potassium was 3.0 and in January was WNL. Will repeat BMP today.

## 2022-07-06 NOTE — Progress Notes (Signed)
Subjective:   [name] is a 89 y.o. female who presents for an Initial Medicare Annual Wellness Visit.  Review of Systems    Defer to PCP.        Objective:    Today's Vitals   07/06/22 1526 07/06/22 1527  BP: (!) 153/53   Pulse: 73   Temp: 98 F (36.7 C)   TempSrc: Oral   SpO2: 100%   Weight: 181 lb 4.8 oz (82.2 kg)   Height: 5\' 4"  (1.626 m)   PainSc:  8    Body mass index is 31.12 kg/m.     07/06/2022    3:34 PM 07/06/2022    3:05 PM 04/02/2022    7:15 AM 03/05/2022    3:32 PM 11/25/2021    3:49 PM 05/19/2021    2:18 PM 02/18/2021    2:50 PM  Advanced Directives  Does Patient Have a Medical Advance Directive? Yes Yes Yes No No No No  Type of Advance Directive Healthcare Power of [employer] Power of [employer] Power of Attorney      Does patient want to make changes to medical advance directive? No - Patient declined  No - Guardian declined      Copy of Healthcare Power of Attorney in Chart? No - copy requested No - copy requested No - copy requested      Would patient like information on creating a medical advance directive?    Yes (MAU/Ambulatory/Procedural Areas - Information given) No - Patient declined No - Patient declined No - Patient declined    Current Medications (verified) Outpatient Encounter Medications as of 07/06/2022  Medication Sig   Accu-Chek FastClix Lancets MISC CHECK BLOOD SUGAR THREE TIMES A DAY. DIAG CODE E11.9. INSULIN DEPENDENT   acetaminophen-codeine (TYLENOL #3) 300-30 MG tablet TAKE 1 TABLET BY MOUTH DAILY AS NEEDED FOR MODERATE PAIN OR SEVERE PAIN. (Patient not taking: Reported on 06/30/2022)   aspirin EC 81 MG tablet Take 1 tablet (81 mg total) by mouth daily.   barrier cream (NON-SPECIFIED) CREA Apply 1 application topically 2 (two) times daily as needed (place on vulva twice daily as needed for redness and swelling). (Patient not taking: Reported on 06/30/2022)   Blood Glucose Monitoring Suppl (ACCU-CHEK GUIDE)  w/Device KIT 1 each by Does not apply route 3 (three) times daily.   carvedilol (COREG) 6.25 MG tablet TAKE 1 TABLET BY MOUTH 2 TIMES DAILY WITH A MEAL.   cilostazol (PLETAL) 100 MG tablet TAKE 1 TABLET BY MOUTH TWICE A DAY   clopidogrel (PLAVIX) 75 MG tablet Take 1 tablet (75 mg total) by mouth daily.   empagliflozin (JARDIANCE) 10 MG TABS tablet Take 1 tablet (10 mg total) by mouth daily.   FLUoxetine (PROZAC) 10 MG capsule TAKE 1 CAPSULE BY MOUTH EVERY DAY   glucose blood (ACCU-CHEK GUIDE) test strip Check blood sugar 3 times per day   Incontinence Supplies MISC For use to address bowel and bladder incontinence.   Insulin Pen Needle (BD PEN NEEDLE NANO U/F) 32G X 4 MM MISC USE TO ADMINISTER SOLIQUA ONE TIME A DAY (Patient not taking: Reported on 06/30/2022)   Insulin Pen Needle 32G X 4 MM MISC Use to administer soliqua one time a day   levETIRAcetam (KEPPRA) 750 MG tablet TAKE 1 TABLET BY MOUTH 2 TIMES DAILY.   losartan (COZAAR) 50 MG tablet Take 1 tablet (50 mg total) by mouth daily.   metFORMIN (GLUCOPHAGE-XR) 500 MG 24 hr tablet Take 1 tablet (500  mg total) by mouth daily with breakfast.   omeprazole (PRILOSEC) 20 MG capsule Take 1 capsule (20 mg total) by mouth daily.   rosuvastatin (CRESTOR) 20 MG tablet TAKE 1 TABLET BY MOUTH EVERY DAY   No facility-administered encounter medications on file as of 07/06/2022.    Allergies (verified) Patient has no known allergies.   History: Past Medical History:  Diagnosis Date   Adenocarcinoma of lung (HCC)     Right upper lobe adenocarcinoma. s/p right lower lobectomy 12/15/10   ARTHRITIS, KNEE 03/24/2006   CHF (congestive heart failure) (HCC)    CVA (cerebrovascular accident) (HCC) 2006   right embolic stroke, no residual deficits   Depression 12/02/2010   DIABETES MELLITUS, TYPE II 2000   Diverticulitis    GERD 12/04/2005   Hemangioma of liver 12/02/2010   History of meningioma of the brain 07/21/2010   Per MRI 10/2010 stable.     History of pulmonary embolism 04/06/2013   HYPERLIPIDEMIA 12/04/2005   HYPERTENSION 12/04/2005   Meningioma (HCC) 07/21/2010   Right upper lobe, Adenocarcinoma of lung    11/09 - BAL reactive findings no malignancy. 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 Last him him 03/2008 when she fell out of care.  Last imagining 2/10 - stable to minimal increase in RUL mass. CT chest 11/12/10 increasing in size PET scan 11/18/10 Interval increase in size and metabolic activity    Rotator cuff arthropathy 05/19/2021   Past Surgical History:  Procedure Laterality Date   ABDOMINAL HYSTERECTOMY     CRANIOTOMY N/A 03/02/2013   Procedure: CRANIOTOMY TUMOR EXCISION;  Surgeon: Carmela Hurt, MD;  Location: [facility];  Service: Neurosurgery;  Laterality: N/A;  Bifrontal Craniotomy for tumor   Extracapsular cataract extraction with intraocular     lens implantation.   INCISION AND DRAINAGE PERIRECTAL ABSCESS N/A 01/19/2013   Procedure: IRRIGATION AND DEBRIDEMENT PERIRECTAL ABSCESS;  Surgeon: Liz Malady, MD;  Location: [facility] OR;  Service: General;  Laterality: N/A;   IR ANGIOGRAM EXTREMITY LEFT  04/02/2022   IR ANGIOGRAM SELECTIVE EACH ADDITIONAL VESSEL  04/02/2022   IR ANGIOGRAM SELECTIVE EACH ADDITIONAL VESSEL  04/02/2022   IR AORTAGRAM ABDOMINAL SERIALOGRAM  04/02/2022   IR FEM POP ART STENT INC PTA MOD SED  04/02/2022   IR ILIAC ART STENT INC PTA MOD SED  04/02/2022   IR RADIOLOGIST EVAL & MGMT  03/12/2022   IR RADIOLOGIST EVAL & MGMT  03/24/2022   IR US GUIDE VASC ACCESS LEFT  04/02/2022   IR US GUIDE VASC ACCESS RIGHT  04/02/2022   RIGHT VATS,RIGHT THORACOTOMY,RIGHT LOWER LOBECTOMY WITH NODE DISSECTION     Video bronchoscope.  12/29/2007   [place]   Wide excision of left upper back mass.     Family History  Problem Relation Age of Onset   Hyperlipidemia Brother    Hypertension Brother    Diabetes Brother     Social History   Socioeconomic History   Marital status: Widowed    Spouse name: Not on file   Number of children: 5   Years of education: 10   Highest education level: Not on file  Occupational History    Employer: UNEMPLOYED  Tobacco Use   Smoking status: Former    Types: Cigarettes    Quit date: 02/17/2000    Years since quitting: 22.3   Smokeless tobacco: Never  Substance and Sexual Activity  Alcohol use: No    Alcohol/week: 0.0 standard drinks of alcohol   Drug use: No   Sexual activity: Not Currently  Other Topics Concern   Not on file  Social History Narrative   Patient requests to use [employer] for her diabetes testing supplies as of 09/05/2009.   Lives at home with daughter, Jerene Pitch.   Caffeine use: 1 cup coffee/day   Social Determinants of Health   Financial Resource Strain: Medium Risk (07/06/2022)   Overall Financial Resource Strain (CARDIA)    Difficulty of Paying Living Expenses: Somewhat hard  Food Insecurity: Food Insecurity Present (07/06/2022)   Hunger Vital Sign    Worried About Running Out of Food in the Last Year: Sometimes true    Ran Out of Food in the Last Year: Never true  Transportation Needs: Unmet Transportation Needs (07/06/2022)   PRAPARE - Administrator, Civil Service (Medical): Yes    Lack of Transportation (Non-Medical): Yes  Physical Activity: Inactive (07/06/2022)   Exercise Vital Sign    Days of Exercise per Week: 0 days    Minutes of Exercise per Session: 0 min  Stress: No Stress Concern Present (07/06/2022)   [employer] of Occupational Health - Occupational Stress Questionnaire    Feeling of Stress : Not at all  Social Connections: Unknown (07/06/2022)   Social Connection and Isolation Panel [NHANES]    Frequency of Communication with Friends and Family: Once a week    Frequency of Social Gatherings with Friends and Family: Once a week    Attends Religious Services: Patient declined    Automotive engineer or Organizations: No    Attends Banker Meetings: Never    Marital Status: Widowed    Tobacco Counseling Counseling given: Not Answered   Clinical Intake:  Pre-visit preparation completed: Yes  Pain : 0-10 Pain Score: 8  Pain Type: Chronic pain Pain Location: Leg Pain Orientation: Left Pain Descriptors / Indicators: Aching Pain Onset: More than a month ago Pain Frequency: Constant Effect of Pain on Daily Activities: "Hurts at night and during the day".     BMI - recorded: 31.12 Nutritional Status: BMI > 30  Obese Nutritional Risks: None Diabetes: Yes CBG done?: No Did pt. bring in CBG monitor from home?: No  How often do you need to have someone help you when you read instructions, pamphlets, or other written materials from your doctor or pharmacy?: 5 - Always What is the last grade level you completed in school?: 10th grade  Diabetic?yes  Interpreter Needed?: No  Information entered by :: Clete Kuch, CMA 07/06/2022 3:29PM   Activities of Daily Living    07/06/2022    3:34 PM 07/06/2022    3:04 PM  In your present state of health, do you have any difficulty performing the following activities:  Hearing? 0 1  Vision? 0 1  Difficulty concentrating or making decisions? 0 1  Walking or climbing stairs? 0 1  Dressing or bathing? 0 1  Doing errands, shopping? 1 1    Patient Care Team: Merrilyn Puma, MD as PCP - General Nadyne Coombes, MD (Ophthalmology) Myrtie Neither Andreas Blower, MD as Consulting Physician (Gastroenterology)  Indicate any recent Medical Services you may have received from other than [facility] providers in the past year (date may be approximate).     Assessment:   This is a routine wellness examination for [name].  Hearing/Vision screen No results found.  Dietary issues and exercise activities  discussed:     Goals Addressed   None   Depression Screen    07/06/2022    3:34 PM 07/06/2022    3:32 PM 07/06/2022     3:05 PM 03/05/2022    3:33 PM 02/23/2022    2:34 PM 11/25/2021    3:50 PM 05/19/2021    2:21 PM  PHQ 2/9 Scores  PHQ - 2 Score 0 0 0 6 2 2  0  PHQ- 9 Score    20 4 7      Fall Risk    07/06/2022    3:34 PM 07/06/2022    3:05 PM 03/05/2022    3:31 PM 02/23/2022    2:33 PM 11/25/2021    3:02 PM  Fall Risk   Falls in the past year? 0 0 0 0 1  Number falls in past yr: 0 0 0  0  Injury with Fall? 0 0 0  0  Risk for fall due to : No Fall Risks No Fall Risks Impaired balance/gait;Impaired mobility Impaired balance/gait No Fall Risks  Follow up Falls evaluation completed Falls evaluation completed Falls evaluation completed;Falls prevention discussed Falls evaluation completed Falls evaluation completed    FALL RISK PREVENTION PERTAINING TO THE HOME:  Any stairs in or around the home? Yes  If so, are there any without handrails? Yes  Home free of loose throw rugs in walkways, pet beds, electrical cords, etc? Yes  Adequate lighting in your home to reduce risk of falls? Yes   ASSISTIVE DEVICES UTILIZED TO PREVENT FALLS:  Life alert? No  Use of a cane, walker or w/c? Yes  Grab bars in the bathroom? No  Shower chair or bench in shower? No  Elevated toilet seat or a handicapped toilet? No   TIMED UP AND GO:  Was the test performed? No .  Length of time to ambulate 10 feet: n/a sec.     Cognitive Function:        Immunizations Immunization History  Administered Date(s) Administered   Influenza Whole 11/29/2007, 11/27/2008, 12/16/2009   Influenza, Seasonal, Injecte, Preservative Fre 04/29/2012   Influenza,inj,Quad PF,6+ Mos 01/21/2013, 11/03/2013, 02/01/2017, 11/23/2017, 11/08/2018, 04/15/2020   Pneumococcal Conjugate-13 07/20/2014   Pneumococcal Polysaccharide-23 07/09/2010, 01/21/2013   Td 03/20/2009   Tdap 04/11/2019    Pneumococcal vaccine status: Up to date  Flu Vaccine status: Up to date  Pneumococcal vaccine status: Up to date  Covid-19 vaccine status: Information  provided on how to obtain vaccines.   Qualifies for Shingles Vaccine? Yes   Zostavax completed No   Shingrix Completed?: No.    Education has been provided regarding the importance of this vaccine. Patient has been advised to call insurance company to determine out of pocket expense if they have not yet received this vaccine. Advised may also receive vaccine at local pharmacy or Health Dept. Verbalized acceptance and understanding.  Screening Tests Health Maintenance  Topic Date Due   COVID-19 Vaccine (1) Never done   Zoster Vaccines- Shingrix (1 of 2) Never done   OPHTHALMOLOGY EXAM  01/23/2022   FOOT EXAM  05/20/2022   HEMOGLOBIN A1C  09/07/2022   INFLUENZA VACCINE  09/17/2022   Medicare Annual Wellness (AWV)  07/06/2023   DTaP/Tdap/Td (3 - Td or Tdap) 04/10/2029   Pneumonia Vaccine 63+ Years old  Completed   DEXA SCAN  Completed   HPV VACCINES  Aged Out   LIPID PANEL  Discontinued    Health Maintenance  Health Maintenance Due  Topic Date Due  COVID-19 Vaccine (1) Never done   Zoster Vaccines- Shingrix (1 of 2) Never done   OPHTHALMOLOGY EXAM  01/23/2022   FOOT EXAM  05/20/2022    Colorectal cancer screening: Defer to PCP.   Mammogram status: Defer to PCP.   Bone Density status: Completed 01/27/2005. Results reflect: Bone density results: OSTEOPOROSIS. Repeat every 2 years.  Lung Cancer Screening: (Low Dose CT Chest recommended if Age 86-80 years, 30 pack-year currently smoking OR have quit w/in 15years.) does not qualify.   Lung Cancer Screening Referral: Defer to PCP.   Additional Screening:  Hepatitis C Screening: does qualify; Completed defer to PCP.   Vision Screening: Recommended annual ophthalmology exams for early detection of glaucoma and other disorders of the eye. Is the patient up to date with their annual eye exam?  No  Who is the provider or what is the name of the office in which the patient attends annual eye exams? Defer to PCP.  If pt is not  established with a provider, would they like to be referred to a provider to establish care? Yes .   Dental Screening: Recommended annual dental exams for proper oral hygiene  Community Resource Referral / Chronic Care Management: CRR required this visit?  No   CCM required this visit?  No      Plan:     I have personally reviewed and noted the following in the patient's chart:   Medical and social history Use of alcohol, tobacco or illicit drugs  Current medications and supplements including opioid prescriptions. Patient is not currently taking opioid prescriptions. Functional ability and status Nutritional status Physical activity Advanced directives List of other physicians Hospitalizations, surgeries, and ER visits in previous 12 months Vitals Screenings to include cognitive, depression, and falls Referrals and appointments  In addition, I have reviewed and discussed with patient certain preventive protocols, quality metrics, and best practice recommendations. A written personalized care plan for preventive services as well as general preventive health recommendations were provided to patient.     Glenice Ciccone, CMA   07/06/2022   Nurse Notes: Face to Face.  [name] , Thank you for taking time to come for your Medicare Wellness Visit. I appreciate your ongoing commitment to your health goals. Please review the following plan we discussed and let me know if I can assist you in the future.   These are the goals we discussed:  Goals       Blood Pressure < 130/80      Care Coordination Activities- MSW (pt-stated)      Patient will apply for FNS benefits for Mom Patient Will contact [employer] regarding transportation Patient denied wanting to use [employer] services due to not liking [employer] Patient inquired on prescription for Tylenol. SW sent inbasket to [facility] and PCP. Patient inquired on life alert. Sw Sent inbasket to [facility] and PCP. Patient stated PCP placed an order for Meals on  wheels and a scooter.       HEMOGLOBIN A1C < 8.0      LDL CALC < 100      Obtain affordable life alert system (pt-stated)      Care Coordination Interventions: Reviewed medications with patient and discussed Christen Butter MSW Collaborated with Christen Butter MSW regarding discounts/insurance for Life alert systems Social Work referral for assistance with financial difficulty, obtaining life alert-? medicaid Assessed social determinant of health barriers         This is a list of the screening recommended for you and  due dates:  Health Maintenance  Topic Date Due   COVID-19 Vaccine (1) Never done   Zoster (Shingles) Vaccine (1 of 2) Never done   Eye exam for diabetics  01/23/2022   Complete foot exam   05/20/2022   Hemoglobin A1C  09/07/2022   Flu Shot  09/17/2022   Medicare Annual Wellness Visit  07/06/2023   DTaP/Tdap/Td vaccine (3 - Td or Tdap) 04/10/2029   Pneumonia Vaccine  Completed   DEXA scan (bone density measurement)  Completed   HPV Vaccine  Aged Out   Lipid (cholesterol) test  Discontinued

## 2022-07-06 NOTE — Patient Instructions (Signed)
Thank you, [name] for allowing [place] to provide your care today. Today we discussed blood pressure, diabetes and medication refill.  -Blood pressure still high but likely since you have not taken your medications today. Please make sure to take your blood pressure medicines every day.  -Continue with metformin and Jardiance for your diabetes, will recheck at next visit -Will check on your tylenol-codeine at the pharmacy -Will check on the incontinence supplies  -referral sent for diabetic eye exam  -Will do blood work today to check electrolytes and kidney function     I have ordered the following labs for you:  Lab Orders         BMP8+Anion Gap      Referrals ordered today:   Referral Orders         Ambulatory referral to Ophthalmology      Follow up: 2-3 months   Should you have any questions or concerns please call the internal medicine clinic at [phone].    Rana Snare, D.O. [facility]

## 2022-07-06 NOTE — Patient Instructions (Signed)

## 2022-07-06 NOTE — Assessment & Plan Note (Signed)
Last A1c 7.7% in April.  She was restarted on Jardiance 10 mg daily and metformin 500 mg daily.  States tolerating well and adhering to medications.  Patient is following with podiatry and last seen by them earlier this month.  Has not had annual diabetic eye exam referral placed today.  Plan -Continue Jardiance and metformin -Referral for ophthalmology placed today -Repeat A1c in 2 months

## 2022-07-06 NOTE — Assessment & Plan Note (Signed)
Repeat BP today 150/59.  Patient states did not take her antihypertensives today.  She is on carvedilol 6.25 mg BID and recently increased losartan 50 mg daily.  Daughter states BP readings at home ranges SBP 130s.  Asymptomatic today.  No medication changes today.  Plan -Continue carvedilol and losartan -BMP today

## 2022-07-06 NOTE — Assessment & Plan Note (Signed)
Paperwork was completed at [facility] for incontinence supplies through [employer].  Will check on status today.

## 2022-07-06 NOTE — Progress Notes (Signed)
CC: HTN and T2DM follow-up  HPI:  [name] is a 89 y.o. female living with a history stated below and presents today for follow-up of HTN and T2DM. Please see problem based assessment and plan for additional details.  Past Medical History:  Diagnosis Date   Adenocarcinoma of lung (HCC)     Right upper lobe adenocarcinoma. s/p right lower lobectomy 12/15/10   ARTHRITIS, KNEE 03/24/2006   CHF (congestive heart failure) (HCC)    CVA (cerebrovascular accident) (HCC) 2006   right embolic stroke, no residual deficits   Depression 12/02/2010   DIABETES MELLITUS, TYPE II 2000   Diverticulitis    GERD 12/04/2005   Hemangioma of liver 12/02/2010   History of meningioma of the brain 07/21/2010   Per MRI 10/2010 stable.    History of pulmonary embolism 04/06/2013   HYPERLIPIDEMIA 12/04/2005   HYPERTENSION 12/04/2005   Meningioma (HCC) 07/21/2010   Right upper lobe, Adenocarcinoma of lung    11/09 - BAL reactive findings no malignancy. 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 Last him him 03/2008 when she fell out of care.  Last imagining 2/10 - stable to minimal increase in RUL mass. CT chest 11/12/10 increasing in size PET scan 11/18/10 Interval increase in size and metabolic activity    Rotator cuff arthropathy 05/19/2021    Current Outpatient Medications on File Prior to Visit  Medication Sig Dispense Refill   Accu-Chek FastClix Lancets MISC CHECK BLOOD SUGAR THREE TIMES A DAY. DIAG CODE E11.9. INSULIN DEPENDENT 306 each 11   acetaminophen-codeine (TYLENOL #3) 300-30 MG tablet TAKE 1 TABLET BY MOUTH DAILY AS NEEDED FOR MODERATE PAIN OR SEVERE PAIN. (Patient not taking: Reported on 06/30/2022) 30 tablet 0   aspirin EC 81 MG tablet Take 1 tablet (81 mg total) by mouth daily. 90 tablet 2   barrier cream (NON-SPECIFIED) CREA Apply 1 application topically 2 (two) times daily as needed  (place on vulva twice daily as needed for redness and swelling). (Patient not taking: Reported on 06/30/2022) 1 each 1   Blood Glucose Monitoring Suppl (ACCU-CHEK GUIDE) w/Device KIT 1 each by Does not apply route 3 (three) times daily. 1 kit 0   carvedilol (COREG) 6.25 MG tablet TAKE 1 TABLET BY MOUTH 2 TIMES DAILY WITH A MEAL. 180 tablet 3   cilostazol (PLETAL) 100 MG tablet TAKE 1 TABLET BY MOUTH TWICE A DAY 180 tablet 1   clopidogrel (PLAVIX) 75 MG tablet Take 1 tablet (75 mg total) by mouth daily. 90 tablet 3   empagliflozin (JARDIANCE) 10 MG TABS tablet Take 1 tablet (10 mg total) by mouth daily. 30 tablet 2   FLUoxetine (PROZAC) 10 MG capsule TAKE 1 CAPSULE BY MOUTH EVERY DAY 90 capsule 1   glucose blood (ACCU-CHEK GUIDE) test strip Check blood sugar 3 times per day 300 each 3   Incontinence Supplies MISC For use to address bowel and bladder incontinence. 30 each 0   Insulin Pen Needle (BD PEN NEEDLE NANO U/F) 32G X 4 MM MISC USE TO ADMINISTER SOLIQUA ONE TIME A DAY (Patient not taking: Reported on 06/30/2022) 100 each 3   Insulin Pen Needle 32G X 4 MM MISC Use to administer soliqua one time a day 100 each 3   levETIRAcetam (KEPPRA) 750 MG tablet TAKE 1 TABLET BY MOUTH 2 TIMES DAILY. 180 tablet 3   losartan (COZAAR) 50 MG tablet  Take 1 tablet (50 mg total) by mouth daily. 90 tablet 3   metFORMIN (GLUCOPHAGE-XR) 500 MG 24 hr tablet Take 1 tablet (500 mg total) by mouth daily with breakfast. 90 tablet 3   omeprazole (PRILOSEC) 20 MG capsule Take 1 capsule (20 mg total) by mouth daily. 90 capsule 1   rosuvastatin (CRESTOR) 20 MG tablet TAKE 1 TABLET BY MOUTH EVERY DAY 90 tablet 1   No current facility-administered medications on file prior to visit.   Review of Systems: ROS negative except for what is noted on the assessment and plan.  Vitals:   07/06/22 1504 07/06/22 1544  BP: (!) 153/53 (!) 150/59  Pulse: 73 72  Temp: 98 F (36.7 C)   TempSrc: Oral   SpO2: 100%   Weight: 181 lb 4.8  oz (82.2 kg)   Height: 5\' 4"  (1.626 m)    Physical Exam: Constitutional: elderly appearing female sitting in wheelchair comfortably, in no acute distress HENT: normocephalic atraumatic Cardiovascular: regular rate and rhythm Pulmonary/Chest: normal work of breathing on room air, lungs clear to auscultation bilaterally MSK: normal bulk and tone Neurological: awake and alert Skin: warm and dry Psych: normal mood   Assessment & Plan:   Hypertension associated with diabetes (HCC) Repeat BP today 150/59.  Patient states did not take her antihypertensives today.  She is on carvedilol 6.25 mg BID and recently increased losartan 50 mg daily.  Daughter states BP readings at home ranges SBP 130s.  Asymptomatic today.  No medication changes today.  Plan -Continue carvedilol and losartan -BMP today  Type 2 diabetes mellitus (HCC) Last A1c 7.7% in April.  She was restarted on Jardiance 10 mg daily and metformin 500 mg daily.  States tolerating well and adhering to medications.  Patient is following with podiatry and last seen by them earlier this month.  Has not had annual diabetic eye exam referral placed today.  Plan -Continue Jardiance and metformin -Referral for ophthalmology placed today -Repeat A1c in 2 months  Incontinence of urine Paperwork was completed at [facility] for incontinence supplies through [employer].  Will check on status today.  Hypokalemia ED visit on 4/7 showed potassium 2.8, was given potassium 40 mEq. In February, potassium was 3.0 and in January was WNL. Will repeat BMP today.    Patient discussed with Dr. Marin Roberts, D.O. [facility], PGY-1 Phone: [phone] Date 07/06/2022 Time 5:22 PM

## 2022-07-07 LAB — BMP8+ANION GAP
Anion Gap: 14 mmol/L (ref 10.0–18.0)
BUN/Creatinine Ratio: 13 (ref 12–28)
BUN: 14 mg/dL (ref 8–27)
CO2: 23 mmol/L (ref 20–29)
Calcium: 8.8 mg/dL (ref 8.7–10.3)
Chloride: 108 mmol/L — ABNORMAL HIGH (ref 96–106)
Creatinine, Ser: 1.09 mg/dL — ABNORMAL HIGH (ref 0.57–1.00)
Glucose: 127 mg/dL — ABNORMAL HIGH (ref 70–99)
Potassium: 3.6 mmol/L (ref 3.5–5.2)
Sodium: 145 mmol/L — ABNORMAL HIGH (ref 134–144)
eGFR: 49 mL/min/{1.73_m2} — ABNORMAL LOW (ref >59)

## 2022-07-07 NOTE — Progress Notes (Signed)
Internal Medicine Clinic Attending  Case discussed with Dr. Zheng  At the time of the visit.  We reviewed the resident's history and exam and pertinent patient test results.  I agree with the assessment, diagnosis, and plan of care documented in the resident's note.  

## 2022-07-10 ENCOUNTER — Telehealth: Payer: Self-pay | Admitting: *Deleted

## 2022-07-10 NOTE — Telephone Encounter (Signed)
Call from patient's daughter states  [facility] Nurse found lump on patient's left breast today.   Lump the size of a quarter.  Visible when looking at breast. No drainage or redness, or hot to touch or pain.    Given appointment for Wednesday at 8:45 AM for assessment.  Etheleen Sia patient's daughter can be reached at [phone] for questions.

## 2022-07-14 ENCOUNTER — Ambulatory Visit (INDEPENDENT_AMBULATORY_CARE_PROVIDER_SITE_OTHER): Payer: 59 | Admitting: Licensed Clinical Social Worker

## 2022-07-14 DIAGNOSIS — Z8673 Personal history of transient ischemic attack (TIA), and cerebral infarction without residual deficits: Secondary | ICD-10-CM

## 2022-07-14 NOTE — BH Specialist Note (Signed)
Patient unavailable on today. [facility] spoke with patients daughter. [facility] will reschedule patient.   Christen Butter, MSW, LCSW-A She/Her Behavioral Health Clinician [facility]  Internal Medicine Center Direct [phone]  Fax [phone] Main Office Phone: [phone] [address] [place] [place], [place] [address] Website: [facility] Internal Medicine [facility]  [place], [place]  [place]

## 2022-07-15 ENCOUNTER — Encounter: Payer: 59 | Admitting: Student

## 2022-07-20 ENCOUNTER — Encounter: Payer: Self-pay | Admitting: *Deleted

## 2022-07-20 ENCOUNTER — Other Ambulatory Visit: Payer: 59 | Admitting: Pharmacist

## 2022-07-21 ENCOUNTER — Ambulatory Visit (INDEPENDENT_AMBULATORY_CARE_PROVIDER_SITE_OTHER): Payer: 59 | Admitting: Internal Medicine

## 2022-07-21 VITALS — BP 132/58 | HR 81 | Temp 98.0°F | Wt 181.9 lb

## 2022-07-21 DIAGNOSIS — N6323 Unspecified lump in the left breast, lower outer quadrant: Secondary | ICD-10-CM

## 2022-07-21 DIAGNOSIS — I739 Peripheral vascular disease, unspecified: Secondary | ICD-10-CM

## 2022-07-21 DIAGNOSIS — Z8673 Personal history of transient ischemic attack (TIA), and cerebral infarction without residual deficits: Secondary | ICD-10-CM

## 2022-07-21 DIAGNOSIS — R634 Abnormal weight loss: Secondary | ICD-10-CM | POA: Diagnosis not present

## 2022-07-21 DIAGNOSIS — M1711 Unilateral primary osteoarthritis, right knee: Secondary | ICD-10-CM

## 2022-07-21 NOTE — Patient Instructions (Addendum)
Thank you, [name] for allowing [place] to provide your care today.   Breast lump- Thank you for coming in. I think you breast lump is a cyst. There is a small chance this could be cancer, I have placed an order for a diagnostic mammogram for the left breast. You will be called to schedule that.  I have completed paperwork for a handicap sticker. We have updated the address for incontinence supplies  Orders are updated for the Power wheelchair, I will get my note in about this. Please let me know if there is anything else needed on our end.   I have ordered the following medication/changed the following medications:   Stop the following medications: There are no discontinued medications.   Start the following medications: No orders of the defined types were placed in this encounter.    Follow up: 2 months for A1c with diabetes  We look forward to seeing you next time. Please call our clinic at [phone] if you have any questions or concerns. The best time to call is Monday-Friday from 9am-4pm, but there is someone available 24/7. If after hours or the weekend, call the main hospital number and ask for the Internal Medicine Resident On-Call. If you need medication refills, please notify your pharmacy one week in advance and they will send [place] a request.   Thank you for trusting me with your care. Wishing you the best!   Rudene Christians, DO [facility]

## 2022-07-21 NOTE — Progress Notes (Signed)
Subjective:  CC: left breast lump  HPI:  [name] is a 89 y.o. female with a past medical history stated below and presents today for new lump in breast. Please see problem based assessment and plan for additional details.  Past Medical History:  Diagnosis Date   Adenocarcinoma of lung (HCC)     Right upper lobe adenocarcinoma. s/p right lower lobectomy 12/15/10   ARTHRITIS, KNEE 03/24/2006   CHF (congestive heart failure) (HCC)    CVA (cerebrovascular accident) (HCC) 2006   right embolic stroke, no residual deficits   Depression 12/02/2010   DIABETES MELLITUS, TYPE II 2000   Diverticulitis    GERD 12/04/2005   Hemangioma of liver 12/02/2010   History of meningioma of the brain 07/21/2010   Per MRI 10/2010 stable.    History of pulmonary embolism 04/06/2013   HYPERLIPIDEMIA 12/04/2005   HYPERTENSION 12/04/2005   Meningioma (HCC) 07/21/2010   Right upper lobe, Adenocarcinoma of lung    11/09 - BAL reactive findings no malignancy. 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 Last him him 03/2008 when she fell out of care.  Last imagining 2/10 - stable to minimal increase in RUL mass. CT chest 11/12/10 increasing in size PET scan 11/18/10 Interval increase in size and metabolic activity    Rotator cuff arthropathy 05/19/2021    Current Outpatient Medications on File Prior to Visit  Medication Sig Dispense Refill   Accu-Chek FastClix Lancets MISC CHECK BLOOD SUGAR THREE TIMES A DAY. DIAG CODE E11.9. INSULIN DEPENDENT 306 each 11   acetaminophen-codeine (TYLENOL #3) 300-30 MG tablet TAKE 1 TABLET BY MOUTH DAILY AS NEEDED FOR MODERATE PAIN OR SEVERE PAIN. (Patient not taking: Reported on 06/30/2022) 30 tablet 0   aspirin EC 81 MG tablet Take 1 tablet (81 mg total) by mouth daily. 90 tablet 2   barrier cream (NON-SPECIFIED) CREA Apply 1 application topically 2 (two) times daily  as needed (place on vulva twice daily as needed for redness and swelling). (Patient not taking: Reported on 06/30/2022) 1 each 1   Blood Glucose Monitoring Suppl (ACCU-CHEK GUIDE) w/Device KIT 1 each by Does not apply route 3 (three) times daily. 1 kit 0   carvedilol (COREG) 6.25 MG tablet TAKE 1 TABLET BY MOUTH 2 TIMES DAILY WITH A MEAL. 180 tablet 3   cilostazol (PLETAL) 100 MG tablet TAKE 1 TABLET BY MOUTH TWICE A DAY 180 tablet 1   clopidogrel (PLAVIX) 75 MG tablet Take 1 tablet (75 mg total) by mouth daily. 90 tablet 3   empagliflozin (JARDIANCE) 10 MG TABS tablet Take 1 tablet (10 mg total) by mouth daily. 30 tablet 2   FLUoxetine (PROZAC) 10 MG capsule TAKE 1 CAPSULE BY MOUTH EVERY DAY 90 capsule 1   glucose blood (ACCU-CHEK GUIDE) test strip Check blood sugar 3 times per day 300 each 3   Incontinence Supplies MISC For use to address bowel and bladder incontinence. 30 each 0   Insulin Pen Needle (BD PEN NEEDLE NANO U/F) 32G X 4 MM MISC USE TO ADMINISTER SOLIQUA ONE TIME A DAY (Patient not taking: Reported on 06/30/2022) 100 each 3   Insulin Pen Needle 32G X 4 MM MISC Use to administer soliqua one time a day 100 each 3   levETIRAcetam (KEPPRA) 750 MG tablet TAKE 1 TABLET BY MOUTH 2 TIMES DAILY. 180 tablet 3   losartan (COZAAR) 50  MG tablet Take 1 tablet (50 mg total) by mouth daily. 90 tablet 3   metFORMIN (GLUCOPHAGE-XR) 500 MG 24 hr tablet Take 1 tablet (500 mg total) by mouth daily with breakfast. 90 tablet 3   omeprazole (PRILOSEC) 20 MG capsule Take 1 capsule (20 mg total) by mouth daily. 90 capsule 1   rosuvastatin (CRESTOR) 20 MG tablet TAKE 1 TABLET BY MOUTH EVERY DAY 90 tablet 1   No current facility-administered medications on file prior to visit.    Family History  Problem Relation Age of Onset   Hyperlipidemia Brother    Hypertension Brother    Diabetes Brother     Social History   Socioeconomic History   Marital status: Widowed    Spouse name: Not on file   Number of  children: 5   Years of education: 10   Highest education level: Not on file  Occupational History    Employer: UNEMPLOYED  Tobacco Use   Smoking status: Former    Types: Cigarettes    Quit date: 02/17/2000    Years since quitting: 22.4   Smokeless tobacco: Never  Substance and Sexual Activity   Alcohol use: No    Alcohol/week: 0.0 standard drinks of alcohol   Drug use: No   Sexual activity: Not Currently  Other Topics Concern   Not on file  Social History Narrative   Patient requests to use [facility] for her diabetes testing supplies as of 09/05/2009.   Lives at home with daughter, Jerene Pitch.   Caffeine use: 1 cup coffee/day   Social Determinants of Health   Financial Resource Strain: Medium Risk (07/06/2022)   Overall Financial Resource Strain (CARDIA)    Difficulty of Paying Living Expenses: Somewhat hard  Food Insecurity: Food Insecurity Present (07/06/2022)   Hunger Vital Sign    Worried About Running Out of Food in the Last Year: Sometimes true    Ran Out of Food in the Last Year: Never true  Transportation Needs: Unmet Transportation Needs (07/06/2022)   PRAPARE - Administrator, Civil Service (Medical): Yes    Lack of Transportation (Non-Medical): Yes  Physical Activity: Inactive (07/06/2022)   Exercise Vital Sign    Days of Exercise per Week: 0 days    Minutes of Exercise per Session: 0 min  Stress: No Stress Concern Present (07/06/2022)   [employer] of Occupational Health - Occupational Stress Questionnaire    Feeling of Stress : Not at all  Social Connections: Unknown (07/06/2022)   Social Connection and Isolation Panel [NHANES]    Frequency of Communication with Friends and Family: Once a week    Frequency of Social Gatherings with Friends and Family: Once a week    Attends Religious Services: Patient declined    Database administrator or Organizations: No    Attends Banker Meetings: Never    Marital Status: Widowed   Intimate Partner Violence: Not At Risk (07/06/2022)   Humiliation, Afraid, Rape, and Kick questionnaire    Fear of Current or Ex-Partner: No    Emotionally Abused: No    Physically Abused: No    Sexually Abused: No    Review of Systems: ROS negative except for what is noted on the assessment and plan.  Objective:   Vitals:   07/21/22 1606  BP: (!) 132/58  Pulse: 81  Temp: 98 F (36.7 C)  TempSrc: Oral  Weight: 181 lb 14.4 oz (82.5 kg)    Physical Exam: Constitutional: well-appearing,  hard of hearing Cardiovascular: regular rate and rhythm, no m/r/g Pulmonary/Chest: normal work of breathing on room air, lungs clear to auscultation bilaterally Abdominal: soft, non-tender, non-distended MSK: 2cm round, mobile mass present at 3 o'clock to upper outer quadrant of left breast Neurological: alert & oriented x 3, seating wheelchair Skin: warm and dry  Assessment & Plan:  PAD (peripheral artery disease) (HCC) This is an 89 year-old with PAD s/p left lower extremity stent placed 3/24. Since stent was place she is now able to ambulate short distance around her house with add from cane, on some days she uses wheelchair in home. The patient's mobility limitation impairs her ability to participate in her ADLs as well as participate in activities that bring wuality to her life outside of the home. She is not able to ambulate fair without wheelchair and her daughter has difficulty with manual wheelchair in settings that she also needs to carry items. This mobility limitation cannot be resolved by the use of a cane or walker, and her caregiver is not always present to help with manual wheelchair. The patient's functional mobility deficit can be resoled with use of power wheelchair. She has been fitted for this at [facility]. -DME order placed for Power wheelchair. -handicap placard completed  Left breast mass New left breast lump noted this month by [facility] aide. Patient denies pain in breast and  she has never had something like this in the past. She is unsure about family history of breast cancer but does know that some family members had cancer but she is unsure what kind. She has a history of lung cancer in right lung s/p resection in 2012. On exam, 2 cm mass present in upper outer quadrant of left breast. Mass is round, mobile, and rubbery.   A/P: From the exam, I think mass is likely a cyst. Will further evaluate with diagnostic mammogram and [place] Information for Breast Center given to patient's daughter whom she lives with  Weight loss 20 lb weight loss noted over last 2 months. I called and talked with her daughter Rene Kocher, who stated that her mom has been eating normally recently. I was not able to reach [place] who lives with patient. I would like her to be seen in 1 month to follow-up on weight loss as well as ensure she completed diagnostic mammogram. -message sent to front desk for patient to follow-up in 1 month    Patient discussed with Dr. Wille Celeste Patrici Minnis, D.O. [facility] Internal Medicine  PGY-2 Pager: [phone]  Phone: [phone] Date 07/22/2022  Time 11:13 AM

## 2022-07-22 ENCOUNTER — Telehealth: Payer: Self-pay | Admitting: Student

## 2022-07-22 DIAGNOSIS — N6320 Unspecified lump in the left breast, unspecified quadrant: Secondary | ICD-10-CM | POA: Insufficient documentation

## 2022-07-22 NOTE — Assessment & Plan Note (Signed)
This is an 89 year-old with PAD s/p left lower extremity stent placed 3/24. Since stent was place she is now able to ambulate short distance around her house with add from cane, on some days she uses wheelchair in home. The patient's mobility limitation impairs her ability to participate in her ADLs as well as participate in activities that bring wuality to her life outside of the home. She is not able to ambulate fair without wheelchair and her daughter has difficulty with manual wheelchair in settings that she also needs to carry items. This mobility limitation cannot be resolved by the use of a cane or walker, and her caregiver is not always present to help with manual wheelchair. The patient's functional mobility deficit can be resoled with use of power wheelchair. She has been fitted for this at [facility]. -DME order placed for Power wheelchair. -handicap placard completed

## 2022-07-22 NOTE — Telephone Encounter (Signed)
Patient contacted at the request of Dr. Sloan Leiter to schedule an appointment in 4 weeks,  Schedule is not available for the month of July.  Spoke with patient's daughter and made her aware.  Asked her to call back in about 2 weeks to schedule a follow up appointment.

## 2022-07-22 NOTE — Assessment & Plan Note (Signed)
New left breast lump noted this month by [facility] aide. Patient denies pain in breast and she has never had something like this in the past. She is unsure about family history of breast cancer but does know that some family members had cancer but she is unsure what kind. She has a history of lung cancer in right lung s/p resection in 2012. On exam, 2 cm mass present in upper outer quadrant of left breast. Mass is round, mobile, and rubbery.   A/P: From the exam, I think mass is likely a cyst. Will further evaluate with diagnostic mammogram and [place]. Information for Breast Center given to patient's daughter whom she lives with

## 2022-07-22 NOTE — Assessment & Plan Note (Addendum)
20 lb weight loss noted over last 2 months. I called and talked with her daughter Rene Kocher, who stated that her mom has been eating normally recently. I was not able to reach [place] who lives with patient. I would like her to be seen in 1 month to follow-up on weight loss as well as ensure she completed diagnostic mammogram. -message sent to front desk for patient to follow-up in 1 month

## 2022-07-22 NOTE — Progress Notes (Signed)
Internal Medicine Clinic Attending  Case discussed with Dr. Masters  At the time of the visit.  We reviewed the resident's history and exam and pertinent patient test results.  I agree with the assessment, diagnosis, and plan of care documented in the resident's note.  

## 2022-08-01 DIAGNOSIS — M1711 Unilateral primary osteoarthritis, right knee: Secondary | ICD-10-CM | POA: Diagnosis not present

## 2022-08-01 DIAGNOSIS — I739 Peripheral vascular disease, unspecified: Secondary | ICD-10-CM | POA: Diagnosis not present

## 2022-08-01 DIAGNOSIS — I5032 Chronic diastolic (congestive) heart failure: Secondary | ICD-10-CM | POA: Diagnosis not present

## 2022-08-14 ENCOUNTER — Telehealth: Payer: Self-pay | Admitting: *Deleted

## 2022-08-24 ENCOUNTER — Other Ambulatory Visit: Payer: Self-pay | Admitting: *Deleted

## 2022-08-24 DIAGNOSIS — G40909 Epilepsy, unspecified, not intractable, without status epilepticus: Secondary | ICD-10-CM

## 2022-08-24 DIAGNOSIS — I739 Peripheral vascular disease, unspecified: Secondary | ICD-10-CM

## 2022-08-24 DIAGNOSIS — R053 Chronic cough: Secondary | ICD-10-CM

## 2022-08-24 DIAGNOSIS — F3342 Major depressive disorder, recurrent, in full remission: Secondary | ICD-10-CM

## 2022-08-24 DIAGNOSIS — N183 Chronic kidney disease, stage 3 unspecified: Secondary | ICD-10-CM

## 2022-08-24 DIAGNOSIS — I5032 Chronic diastolic (congestive) heart failure: Secondary | ICD-10-CM

## 2022-08-24 DIAGNOSIS — I10 Essential (primary) hypertension: Secondary | ICD-10-CM

## 2022-08-24 DIAGNOSIS — Z794 Long term (current) use of insulin: Secondary | ICD-10-CM

## 2022-08-24 MED ORDER — ACETAMINOPHEN-CODEINE 300-30 MG PO TABS: ORAL_TABLET | ORAL | 0 refills | Status: DC

## 2022-08-24 MED ORDER — CILOSTAZOL 100 MG PO TABS: 100.0000 mg | ORAL_TABLET | Freq: Two times a day (BID) | ORAL | 1 refills | Status: DC

## 2022-08-24 MED ORDER — FLUOXETINE HCL 10 MG PO CAPS: 10.0000 mg | ORAL_CAPSULE | Freq: Every day | ORAL | 1 refills | Status: DC

## 2022-08-24 MED ORDER — EMPAGLIFLOZIN 10 MG PO TABS: 10.0000 mg | ORAL_TABLET | Freq: Every day | ORAL | 2 refills | Status: DC

## 2022-08-24 MED ORDER — OMEPRAZOLE 20 MG PO CPDR: 20.0000 mg | DELAYED_RELEASE_CAPSULE | Freq: Every day | ORAL | 1 refills | Status: DC

## 2022-08-24 MED ORDER — LOSARTAN POTASSIUM 50 MG PO TABS: 50.0000 mg | ORAL_TABLET | Freq: Every day | ORAL | 3 refills | Status: DC

## 2022-08-24 MED ORDER — ROSUVASTATIN CALCIUM 20 MG PO TABS: 20.0000 mg | ORAL_TABLET | Freq: Every day | ORAL | 1 refills | Status: DC

## 2022-08-24 MED ORDER — METFORMIN HCL ER 500 MG PO TB24: 500.0000 mg | ORAL_TABLET | Freq: Every day | ORAL | 3 refills | Status: DC

## 2022-08-24 MED ORDER — LEVETIRACETAM 750 MG PO TABS
750.0000 mg | ORAL_TABLET | Freq: Two times a day (BID) | ORAL | 3 refills | Status: DC
Start: 2022-08-24 — End: 2022-09-29

## 2022-08-24 MED ORDER — CARVEDILOL 6.25 MG PO TABS: 6.2500 mg | ORAL_TABLET | Freq: Two times a day (BID) | ORAL | 3 refills | Status: DC

## 2022-08-24 NOTE — Telephone Encounter (Signed)
Patient's daughter called in stating [facility] has been trying to get 6-7 refils from PCP. No requests found in Epic. Will forward all meds that appear to need refills.  Transferred back to front Desk to schedule 2 month f/u for ~ 09/20/22.

## 2022-08-31 DIAGNOSIS — I739 Peripheral vascular disease, unspecified: Secondary | ICD-10-CM | POA: Diagnosis not present

## 2022-08-31 DIAGNOSIS — M1711 Unilateral primary osteoarthritis, right knee: Secondary | ICD-10-CM | POA: Diagnosis not present

## 2022-08-31 DIAGNOSIS — I5032 Chronic diastolic (congestive) heart failure: Secondary | ICD-10-CM | POA: Diagnosis not present

## 2022-09-02 ENCOUNTER — Other Ambulatory Visit: Payer: 59

## 2022-09-15 ENCOUNTER — Other Ambulatory Visit: Payer: 59

## 2022-09-15 DIAGNOSIS — Z8673 Personal history of transient ischemic attack (TIA), and cerebral infarction without residual deficits: Secondary | ICD-10-CM | POA: Diagnosis not present

## 2022-09-15 DIAGNOSIS — I739 Peripheral vascular disease, unspecified: Secondary | ICD-10-CM | POA: Diagnosis not present

## 2022-09-15 DIAGNOSIS — R5381 Other malaise: Secondary | ICD-10-CM | POA: Diagnosis not present

## 2022-09-15 DIAGNOSIS — M1711 Unilateral primary osteoarthritis, right knee: Secondary | ICD-10-CM | POA: Diagnosis not present

## 2022-09-21 ENCOUNTER — Encounter: Payer: 59 | Admitting: Student

## 2022-09-21 ENCOUNTER — Other Ambulatory Visit: Payer: Self-pay

## 2022-09-21 ENCOUNTER — Telehealth: Payer: Self-pay

## 2022-09-21 NOTE — Progress Notes (Signed)
   09/21/2022  Patient ID: [name], female   DOB: 27-Mar-1933, 89 y.o.   MRN: [id number]  Outreach attempt to contact patient's daughter, Etheleen Sia (DRP), to verify if patient was ever enrolled in mail order through [facility].  I was not able to reach [place], but I left a voicemail with my direct phone number to call back.  A message has also been sent to the pharmacy to check on the status of this.  Lenna Gilford, PharmD, DPLA

## 2022-09-22 ENCOUNTER — Other Ambulatory Visit (HOSPITAL_COMMUNITY): Payer: Self-pay

## 2022-09-22 ENCOUNTER — Telehealth: Payer: Self-pay

## 2022-09-22 NOTE — Progress Notes (Signed)
   09/22/2022  Patient ID: Coral Else, female   DOB: 1933/11/30, 89 y.o.   MRN: [id number]  S/O Telephone encounter to follow-up with patient's daughter regarding getting patient set up for mail order through [facility]  Medication Access/Adherence -Initial visit to discuss this in May, but no transfers or new orders were ever received by [facility] -Contacted Rph, and she does endorse the patient's profile has been coded for [employer] -Verified active medication list with patient's daughter.  The only one she is not sure about is the clopidogrel, as they do not have that at home and are unsure when she took this medication last. -Daughter is also requesting the provider sent Tylenol #3 to [facility], so all medications can be delivered.  She understands someone must be home to sign for these deliveries  A/P  Medication Access/Adherence -Contacting Lynnette Caffey, PA to verify if patient is to still be taking clopidogrel 75mg  daily along with ASA 81mg  and cilostzaol 100mg  BID -Contacting Dr. Elza Rafter to inform patient/daughter are requesting Tylenol #3 to be sent to [facility] -Following up with [facility] to verify they were able to receive transfers for all other active medications and inform them to call daughter to get mailing and billing information   Follow-up:  Will contact daughter about clopidogrel and make sure all medications are received and set up for mail order  Lenna Gilford, PharmD, DPLA

## 2022-09-23 ENCOUNTER — Other Ambulatory Visit: Payer: 59

## 2022-09-24 ENCOUNTER — Other Ambulatory Visit (HOSPITAL_COMMUNITY): Payer: Self-pay

## 2022-09-25 ENCOUNTER — Other Ambulatory Visit: Payer: Self-pay

## 2022-09-25 ENCOUNTER — Other Ambulatory Visit (HOSPITAL_COMMUNITY): Payer: Self-pay

## 2022-09-25 MED ORDER — LOSARTAN POTASSIUM 50 MG PO TABS: 50.0000 mg | ORAL_TABLET | Freq: Every day | ORAL | 3 refills | Status: DC

## 2022-09-25 MED ORDER — CILOSTAZOL 100 MG PO TABS
100.0000 mg | ORAL_TABLET | Freq: Two times a day (BID) | ORAL | 1 refills | Status: DC
  Filled 2022-09-25: qty 180, 90d supply, fill #0

## 2022-09-25 MED ORDER — EMPAGLIFLOZIN 10 MG PO TABS
10.0000 mg | ORAL_TABLET | Freq: Every day | ORAL | 2 refills | Status: DC
  Filled 2022-09-25: qty 30, 30d supply, fill #0

## 2022-09-25 MED ORDER — OMEPRAZOLE 20 MG PO CPDR
20.0000 mg | DELAYED_RELEASE_CAPSULE | Freq: Every day | ORAL | 1 refills | Status: DC
  Filled 2022-09-25: qty 90, 90d supply, fill #0
  Filled 2023-06-01: qty 90, 90d supply, fill #1

## 2022-09-25 MED ORDER — METFORMIN HCL ER 500 MG PO TB24
500.0000 mg | ORAL_TABLET | Freq: Every day | ORAL | 3 refills | Status: DC
  Filled 2022-09-25: qty 90, 90d supply, fill #0

## 2022-09-25 MED ORDER — ROSUVASTATIN CALCIUM 20 MG PO TABS
20.0000 mg | ORAL_TABLET | Freq: Every day | ORAL | 1 refills | Status: DC
  Filled 2022-09-25: qty 90, 90d supply, fill #0
  Filled 2023-06-01: qty 90, 90d supply, fill #1

## 2022-09-25 MED ORDER — FLUOXETINE HCL 10 MG PO CAPS
10.0000 mg | ORAL_CAPSULE | Freq: Every day | ORAL | 1 refills | Status: DC
  Filled 2022-09-25: qty 90, 90d supply, fill #0
  Filled 2023-06-01: qty 90, 90d supply, fill #1

## 2022-09-25 MED ORDER — LOSARTAN POTASSIUM 50 MG PO TABS
50.0000 mg | ORAL_TABLET | Freq: Every day | ORAL | 3 refills | Status: DC
  Filled 2022-09-25: qty 90, 90d supply, fill #0

## 2022-09-25 MED ORDER — LEVETIRACETAM 750 MG PO TABS
750.0000 mg | ORAL_TABLET | Freq: Two times a day (BID) | ORAL | 3 refills | Status: DC
  Filled 2022-09-25: qty 180, 90d supply, fill #0
  Filled 2023-06-01: qty 180, 90d supply, fill #1

## 2022-09-25 MED ORDER — CARVEDILOL 6.25 MG PO TABS
6.2500 mg | ORAL_TABLET | Freq: Two times a day (BID) | ORAL | 3 refills | Status: DC
  Filled 2022-09-25: qty 180, 90d supply, fill #0
  Filled 2023-06-01: qty 180, 90d supply, fill #1

## 2022-09-25 NOTE — Progress Notes (Signed)
   09/25/2022  Patient ID: [name], female   DOB: Sep 16, 1933, 89 y.o.   MRN: [id number]  Provider confirmed patient is no longer to be taking clopidogrel.  I contacted [facility] to inform them this should not be mailed out, and I will also let the patient/daughter know.  Lenna Gilford, PharmD, DPLA

## 2022-09-25 NOTE — Addendum Note (Signed)
Addended by: Sabino Niemann A on: 09/25/2022 01:07 PM   Modules accepted: Orders

## 2022-09-26 ENCOUNTER — Other Ambulatory Visit (HOSPITAL_COMMUNITY): Payer: Self-pay

## 2022-09-28 ENCOUNTER — Ambulatory Visit: Payer: 59 | Admitting: Podiatry

## 2022-09-28 ENCOUNTER — Encounter (HOSPITAL_COMMUNITY): Payer: Self-pay

## 2022-09-28 ENCOUNTER — Telehealth: Payer: Self-pay | Admitting: *Deleted

## 2022-09-28 ENCOUNTER — Ambulatory Visit (HOSPITAL_COMMUNITY)
Admission: EM | Admit: 2022-09-28 | Discharge: 2022-09-28 | Disposition: A | Discharge status: Home / Self Care | Payer: 59 | Attending: Emergency Medicine | Admitting: Emergency Medicine

## 2022-09-28 ENCOUNTER — Encounter: Payer: 59 | Admitting: Student

## 2022-09-28 ENCOUNTER — Other Ambulatory Visit: Payer: Self-pay

## 2022-09-28 DIAGNOSIS — R197 Diarrhea, unspecified: Secondary | ICD-10-CM | POA: Diagnosis not present

## 2022-09-28 LAB — COMPREHENSIVE METABOLIC PANEL
ALT: 11 U/L (ref 0–44)
AST: 11 U/L — ABNORMAL LOW (ref 15–41)
Albumin: 3.7 g/dL (ref 3.5–5.0)
Alkaline Phosphatase: 49 U/L (ref 38–126)
Anion gap: 12 (ref 5–15)
BUN: 29 mg/dL — ABNORMAL HIGH (ref 8–23)
CO2: 22 mmol/L (ref 22–32)
Calcium: 9.1 mg/dL (ref 8.9–10.3)
Chloride: 107 mmol/L (ref 98–111)
Creatinine, Ser: 1.39 mg/dL — ABNORMAL HIGH (ref 0.44–1.00)
GFR, Estimated: 37 mL/min — ABNORMAL LOW (ref >60)
Glucose, Bld: 143 mg/dL — ABNORMAL HIGH (ref 70–99)
Potassium: 4.2 mmol/L (ref 3.5–5.1)
Sodium: 141 mmol/L (ref 135–145)
Total Bilirubin: 0.7 mg/dL (ref 0.3–1.2)
Total Protein: 7.3 g/dL (ref 6.5–8.1)

## 2022-09-28 LAB — CBC WITH DIFFERENTIAL/PLATELET
Abs Immature Granulocytes: 0.02 10*3/uL (ref 0.00–0.07)
Basophils Absolute: 0 10*3/uL (ref 0.0–0.1)
Basophils Relative: 0 %
Eosinophils Absolute: 0 10*3/uL (ref 0.0–0.5)
Eosinophils Relative: 1 %
HCT: 42.6 % (ref 36.0–46.0)
Hemoglobin: 12.5 g/dL (ref 12.0–15.0)
Immature Granulocytes: 0 %
Lymphocytes Relative: 26 %
Lymphs Abs: 1.5 10*3/uL (ref 0.7–4.0)
MCH: 24.1 pg — ABNORMAL LOW (ref 26.0–34.0)
MCHC: 29.3 g/dL — ABNORMAL LOW (ref 30.0–36.0)
MCV: 82.1 fL (ref 80.0–100.0)
Monocytes Absolute: 0.6 10*3/uL (ref 0.1–1.0)
Monocytes Relative: 10 %
Neutro Abs: 3.6 10*3/uL (ref 1.7–7.7)
Neutrophils Relative %: 63 %
Platelets: 220 10*3/uL (ref 150–400)
RBC: 5.19 MIL/uL — ABNORMAL HIGH (ref 3.87–5.11)
RDW: 16 % — ABNORMAL HIGH (ref 11.5–15.5)
WBC: 5.7 10*3/uL (ref 4.0–10.5)
nRBC: 0 % (ref 0.0–0.2)

## 2022-09-28 NOTE — Telephone Encounter (Signed)
Call from patient's daughter missed today's appointment thought it was at 2:15 and not 1:15 PM.  Patient has had diarrhea for about a week.   Appetite is less.  Does not have a fever.  Was given something for diarrhea about a week ago.  Daugther was advised to take patient to th Urgent Care for assessment of the diarrhea.  Will call to reschedule an appointment in the Clinics.

## 2022-09-28 NOTE — ED Provider Notes (Signed)
[facility]    CSN: [id number] Arrival date & time: 09/28/22  1545      History   Chief Complaint Chief Complaint  Patient presents with   Diarrhea   Fatigue    HPI [name] is a 89 y.o. female. Here with family member who is also caregiver. Family reunion was 8/3. About 3 days later, ~1 week ago, pt developed diarrhea. 1 dose of iimodium 09/21/22 helped a bit temporarily, has not taken any since. Has anywhere from 3- many episodes of light brown liquid diarrhea per day. Denies abd pain, denies pain anywhere. Does not feel sick. No vomiting. Just tired and with diarrhea. Per family member, pt is usually active and since developing diarrhea, activity level has decreased and appetite has decreased. Diet has not been cahnged. No antibiotics since last February. No one else is sick.    Diarrhea   Past Medical History:  Diagnosis Date   Adenocarcinoma of lung (HCC)     Right upper lobe adenocarcinoma. s/p right lower lobectomy 12/15/10   ARTHRITIS, KNEE 03/24/2006   CHF (congestive heart failure) (HCC)    CVA (cerebrovascular accident) (HCC) 2006   right embolic stroke, no residual deficits   Depression 12/02/2010   DIABETES MELLITUS, TYPE II 2000   Diverticulitis    GERD 12/04/2005   Hemangioma of liver 12/02/2010   History of meningioma of the brain 07/21/2010   Per MRI 10/2010 stable.    History of pulmonary embolism 04/06/2013   HYPERLIPIDEMIA 12/04/2005   HYPERTENSION 12/04/2005   Meningioma (HCC) 07/21/2010   Right upper lobe, Adenocarcinoma of lung    11/09 - BAL reactive findings no malignancy. 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 Last him him 03/2008 when she fell out of care.  Last imagining 2/10 - stable to minimal increase in RUL mass. CT chest 11/12/10 increasing in size PET scan 11/18/10 Interval increase in size and metabolic activity    Rotator  cuff arthropathy 05/19/2021    Patient Active Problem List   Diagnosis Date Noted   Left breast mass 07/22/2022   Candidal vulvovaginitis 03/05/2022   Physical deconditioning 11/25/2021   CKD stage 3 due to type 2 diabetes mellitus (HCC) 07/05/2018   PAD (peripheral artery disease) (HCC) 06/10/2017   Liver hemangioma 11/13/2016   Obesity (BMI 30.0-34.9) 08/20/2016   Hearing loss 08/20/2016   Osteoarthritis of right knee 10/30/2014   Seizure disorder (HCC) 10/06/2013   Normocytic anemia 04/18/2013   Chronic cough 04/29/2012   Healthcare maintenance 02/24/2011   Depression 12/02/2010   Chronic diastolic congestive heart failure (HCC)    Weight loss 02/20/2010   Incontinence of urine 11/29/2007   Type 2 diabetes mellitus (HCC) 12/04/2005   Hyperlipidemia associated with type 2 diabetes mellitus (HCC) 12/04/2005   Hypertension associated with diabetes (HCC) 12/04/2005   GERD 12/04/2005   History of CVA (cerebrovascular accident) 12/02/2002    Past Surgical History:  Procedure Laterality Date   ABDOMINAL HYSTERECTOMY     CRANIOTOMY N/A 03/02/2013   Procedure: CRANIOTOMY TUMOR EXCISION;  Surgeon: Carmela Hurt, MD;  Location: [facility];  Service: Neurosurgery;  Laterality: N/A;  Bifrontal Craniotomy for tumor   Extracapsular cataract extraction with intraocular     lens implantation.   INCISION AND DRAINAGE PERIRECTAL ABSCESS N/A 01/19/2013   Procedure: IRRIGATION AND DEBRIDEMENT PERIRECTAL ABSCESS;  Surgeon: Liz Malady, MD;  Location: [facility];  Service: General;  Laterality: N/A;   IR ANGIOGRAM EXTREMITY LEFT  04/02/2022   IR ANGIOGRAM SELECTIVE EACH ADDITIONAL VESSEL  04/02/2022   IR ANGIOGRAM SELECTIVE EACH ADDITIONAL VESSEL  04/02/2022   IR AORTAGRAM ABDOMINAL SERIALOGRAM  04/02/2022   IR FEM POP ART STENT INC PTA MOD SED  04/02/2022   IR ILIAC ART STENT INC PTA MOD SED  04/02/2022   IR RADIOLOGIST EVAL & MGMT  03/12/2022   IR RADIOLOGIST EVAL & MGMT  03/24/2022   IR US GUIDE  VASC ACCESS LEFT  04/02/2022   IR US GUIDE VASC ACCESS RIGHT  04/02/2022   RIGHT VATS,RIGHT THORACOTOMY,RIGHT LOWER LOBECTOMY WITH NODE DISSECTION     Video bronchoscope.  12/29/2007   [place]   Wide excision of left upper back mass.      OB History   No obstetric history on file.      Home Medications    Prior to Admission medications   Medication Sig Start Date End Date Taking? Authorizing Provider  Accu-Chek FastClix Lancets MISC CHECK BLOOD SUGAR THREE TIMES A DAY. DIAG CODE E11.9. INSULIN DEPENDENT 10/23/20  Yes Merrilyn Puma, MD  aspirin EC 81 MG tablet Take 1 tablet (81 mg total) by mouth daily. 02/18/21  Yes Christian, Rylee, MD  Blood Glucose Monitoring Suppl (ACCU-CHEK GUIDE) w/Device KIT 1 each by Does not apply route 3 (three) times daily. 10/23/20  Yes Merrilyn Puma, MD  carvedilol (COREG) 6.25 MG tablet Take 1 tablet (6.25 mg total) by mouth 2 (two) times daily with a meal. 08/24/22  Yes Atway, Rayann N, DO  carvedilol (COREG) 6.25 MG tablet Take 1 tablet (6.25 mg total) by mouth 2 (two) times daily with a meal. 08/24/22  Yes Atway, Rayann N, DO  cilostazol (PLETAL) 100 MG tablet Take 1 tablet (100 mg total) by mouth 2 (two) times daily. 08/24/22  Yes Atway, Rayann N, DO  cilostazol (PLETAL) 100 MG tablet Take 1 tablet (100 mg total) by mouth 2 (two) times daily. 08/24/22  Yes Atway, Rayann N, DO  empagliflozin (JARDIANCE) 10 MG TABS tablet Take 1 tablet (10 mg total) by mouth daily. 08/24/22  Yes Atway, Rayann N, DO  empagliflozin (JARDIANCE) 10 MG TABS tablet Take 1 tablet (10 mg total) by mouth daily. 06/08/22  Yes   FLUoxetine (PROZAC) 10 MG capsule Take 1 capsule (10 mg total) by mouth daily. 08/24/22  Yes Atway, Rayann N, DO  FLUoxetine (PROZAC) 10 MG capsule Take 1 capsule (10 mg total) by mouth daily. 08/24/22  Yes Atway, Rayann N, DO  glucose blood (ACCU-CHEK GUIDE) test strip Check blood sugar 3 times per day 10/23/20  Yes Merrilyn Puma, MD  Incontinence Supplies MISC For use to  address bowel and bladder incontinence. 07/04/15  Yes Ruben Im, MD  Insulin Pen Needle 32G X 4 MM MISC Use to administer soliqua one time a day 12/23/20  Yes Merrilyn Puma, MD  levETIRAcetam (KEPPRA) 750 MG tablet Take 1 tablet (750 mg total) by mouth 2 (two) times daily. 08/24/22  Yes Atway, Rayann N, DO  levETIRAcetam (KEPPRA) 750 MG tablet Take 1 tablet (750 mg total) by mouth 2 (two) times daily. 08/24/22  Yes Atway, Rayann N, DO  losartan (COZAAR) 50 MG tablet Take 1 tablet (50 mg total) by mouth daily. 08/24/22  Yes Atway, Rayann N, DO  losartan (COZAAR) 50 MG tablet Take 1 tablet (50 mg total) by mouth daily. 06/08/22  Yes   losartan (COZAAR) 50 MG tablet  Take 1 tablet (50 mg total) by mouth daily. 08/24/22  Yes Atway, Rayann N, DO  metFORMIN (GLUCOPHAGE-XR) 500 MG 24 hr tablet Take 1 tablet (500 mg total) by mouth daily with breakfast. 08/24/22  Yes Atway, Rayann N, DO  metFORMIN (GLUCOPHAGE-XR) 500 MG 24 hr tablet Take 1 tablet (500 mg total) by mouth daily with breakfast. 08/24/22  Yes Atway, Rayann N, DO  omeprazole (PRILOSEC) 20 MG capsule Take 1 capsule (20 mg total) by mouth daily. 08/24/22  Yes Atway, Rayann N, DO  omeprazole (PRILOSEC) 20 MG capsule Take 1 capsule (20 mg total) by mouth daily. 08/24/22  Yes Atway, Rayann N, DO  rosuvastatin (CRESTOR) 20 MG tablet Take 1 tablet (20 mg total) by mouth daily. 08/24/22  Yes Atway, Rayann N, DO  rosuvastatin (CRESTOR) 20 MG tablet Take 1 tablet (20 mg total) by mouth daily. 08/24/22  Yes Atway, Rayann N, DO  acetaminophen-codeine (TYLENOL #3) 300-30 MG tablet TAKE 1 TABLET BY MOUTH DAILY AS NEEDED FOR MODERATE PAIN OR SEVERE PAIN. Patient not taking: Reported on 09/22/2022 08/24/22   Atway, Rayann N, DO  barrier cream (NON-SPECIFIED) CREA Apply 1 application topically 2 (two) times daily as needed (place on vulva twice daily as needed for redness and swelling). Patient not taking: Reported on 06/30/2022 10/23/20   Merrilyn Puma, MD  Insulin Pen Needle (BD PEN  NEEDLE NANO U/F) 32G X 4 MM MISC USE TO ADMINISTER SOLIQUA ONE TIME A DAY Patient not taking: Reported on 06/30/2022 01/01/21   Merrilyn Puma, MD    Family History Family History  Problem Relation Age of Onset   Hyperlipidemia Brother    Hypertension Brother    Diabetes Brother     Social History Social History   Tobacco Use   Smoking status: Former    Current packs/day: 0.00    Types: Cigarettes    Quit date: 02/17/2000    Years since quitting: 22.6   Smokeless tobacco: Never  Substance Use Topics   Alcohol use: No    Alcohol/week: 0.0 standard drinks of alcohol   Drug use: No     Allergies   Patient has no known allergies.   Review of Systems Review of Systems  Gastrointestinal:  Positive for diarrhea.     Physical Exam Triage Vital Signs ED Triage Vitals  Encounter Vitals Group     BP 09/28/22 1558 114/67     Systolic BP Percentile --      Diastolic BP Percentile --      Pulse Rate 09/28/22 1558 78     Resp 09/28/22 1558 16     Temp 09/28/22 1558 98.4 F (36.9 C)     Temp Source 09/28/22 1558 Oral     SpO2 09/28/22 1558 97 %     Weight 09/28/22 1557 181 lb 14.1 oz (82.5 kg)     Height 09/28/22 1557 5\' 4"  (1.626 m)     Head Circumference --      Peak Flow --      Pain Score --      Pain Loc --      Pain Education --      Exclude from Growth Chart --    No data found.  Updated Vital Signs BP 114/67 (BP Location: Left Arm)   Pulse 78   Temp 98.4 F (36.9 C) (Oral)   Resp 16   Ht 5\' 4"  (1.626 m)   Wt 181 lb 14.1 oz (82.5 kg)   SpO2 97%  BMI 31.22 kg/m   Visual Acuity Right Eye Distance:   Left Eye Distance:   Bilateral Distance:    Right Eye Near:   Left Eye Near:    Bilateral Near:     Physical Exam Constitutional:      General: She is not in acute distress.    Appearance: Normal appearance. She is not ill-appearing.  HENT:     Ears:     Comments: Pt is very hard of hearing but can hear you if you speak loudly directly into her  ear Cardiovascular:     Rate and Rhythm: Normal rate and regular rhythm.  Pulmonary:     Effort: Pulmonary effort is normal.     Breath sounds: Normal breath sounds.  Abdominal:     General: Abdomen is flat. Bowel sounds are normal.     Tenderness: There is no abdominal tenderness. There is no guarding or rebound.  Neurological:     Mental Status: She is alert.      UC Treatments / Results  Labs (all labs ordered are listed, but only abnormal results are displayed) Labs Reviewed  CBC WITH DIFFERENTIAL/PLATELET  COMPREHENSIVE METABOLIC PANEL    EKG   Radiology No results found.  Procedures Procedures (including critical care time)  Medications Ordered in UC Medications - No data to display  Initial Impression / Assessment and Plan / UC Course  I have reviewed the triage vital signs and the nursing notes.  Pertinent labs & imaging results that were available during my care of the patient were reviewed by me and considered in my medical decision making (see chart for details).    Pt with change in activity level and diarrhea for about a week. Discussed diet for diarrhea, hydration, using Imodium. Will get CBC and CMP. Pt to f/u with PCP  Final Clinical Impressions(s) / UC Diagnoses   Final diagnoses:  Diarrhea, unspecified type     Discharge Instructions      You will get a call if tests are abnormal, you will not get a call if tests are normal but you can check results in MyChart if you have a MyChart account.   See the attached handout on food to help relieve diarrhea. Consider also popsicles, jello, chicken noodle soup. You can try pedialyte for electrolyte replacement.   It's ok for her to use Imodium per package directions for 1-2 days also with diet changes to help relieve diarrhea.     ED Prescriptions   None    PDMP not reviewed this encounter.   Cathlyn Parsons, NP 09/28/22 1727

## 2022-09-28 NOTE — Discharge Instructions (Addendum)
You will get a call if tests are abnormal, you will not get a call if tests are normal but you can check results in MyChart if you have a MyChart account.   See the attached handout on food to help relieve diarrhea. Consider also popsicles, jello, chicken noodle soup. You can try pedialyte for electrolyte replacement.   It's ok for her to use Imodium per package directions for 1-2 days also with diet changes to help relieve diarrhea.

## 2022-09-28 NOTE — ED Triage Notes (Signed)
Diarrhea; Fatigue onset after family reunion on the 3rd. 2 days afterwards Patient has not been eating well and staying in bed. No fever, no sick exposure, no one else with similar symptoms.  Tried otc antidiarrhetics with slight relief.

## 2022-09-29 ENCOUNTER — Telehealth: Payer: Self-pay

## 2022-09-29 NOTE — Progress Notes (Signed)
   09/29/2022  Patient ID: [name], female   DOB: 01-May-1933, 89 y.o.   MRN: [id number]  Outreach attempt to follow up with patient's daughter, Etheleen Sia ([place]), to verify [name] has been set up for mail order through [facility].  It appears several maintenance medications were filled this week and should arrive at her home in the next day or two.  I also wanted to verify that she is not to be taking clopidogrel (per provider).  I was not able to reach [name] but left a HIPAA compliant voicemail with my direct phone number.  Lenna Gilford, PharmD, DPLA

## 2022-09-29 NOTE — Addendum Note (Signed)
Addended by: Sabino Niemann A on: 09/29/2022 04:13 PM   Modules accepted: Orders

## 2022-10-01 ENCOUNTER — Telehealth: Payer: Self-pay

## 2022-10-01 DIAGNOSIS — M1711 Unilateral primary osteoarthritis, right knee: Secondary | ICD-10-CM | POA: Diagnosis not present

## 2022-10-01 DIAGNOSIS — I5032 Chronic diastolic (congestive) heart failure: Secondary | ICD-10-CM | POA: Diagnosis not present

## 2022-10-01 DIAGNOSIS — I739 Peripheral vascular disease, unspecified: Secondary | ICD-10-CM | POA: Diagnosis not present

## 2022-10-01 NOTE — Progress Notes (Signed)
   10/01/2022  Patient ID: [name], female   DOB: 1933-12-20, 89 y.o.   MRN: [id number]  Outreach to follow-up on medication access/adherence.  I was able to speak with patient's daughter, [name] [facility]), to verify the patient has successfully received all maintenance medications via home delivery from [facility].  I also informed [name] that [name] is to continue to NOT take clopidogrel; she verified that she has not been taking and will not resume unless otherwise advised by provider.  Lenna Gilford, PharmD, DPLA

## 2022-10-05 ENCOUNTER — Other Ambulatory Visit: Payer: 59

## 2022-10-16 DIAGNOSIS — M1711 Unilateral primary osteoarthritis, right knee: Secondary | ICD-10-CM | POA: Diagnosis not present

## 2022-10-16 DIAGNOSIS — Z8673 Personal history of transient ischemic attack (TIA), and cerebral infarction without residual deficits: Secondary | ICD-10-CM | POA: Diagnosis not present

## 2022-10-16 DIAGNOSIS — R5381 Other malaise: Secondary | ICD-10-CM | POA: Diagnosis not present

## 2022-10-16 DIAGNOSIS — I739 Peripheral vascular disease, unspecified: Secondary | ICD-10-CM | POA: Diagnosis not present

## 2022-11-01 DIAGNOSIS — I5032 Chronic diastolic (congestive) heart failure: Secondary | ICD-10-CM | POA: Diagnosis not present

## 2022-11-01 DIAGNOSIS — M1711 Unilateral primary osteoarthritis, right knee: Secondary | ICD-10-CM | POA: Diagnosis not present

## 2022-11-01 DIAGNOSIS — I739 Peripheral vascular disease, unspecified: Secondary | ICD-10-CM | POA: Diagnosis not present

## 2022-11-16 DIAGNOSIS — I739 Peripheral vascular disease, unspecified: Secondary | ICD-10-CM | POA: Diagnosis not present

## 2022-11-16 DIAGNOSIS — R5381 Other malaise: Secondary | ICD-10-CM | POA: Diagnosis not present

## 2022-11-16 DIAGNOSIS — Z8673 Personal history of transient ischemic attack (TIA), and cerebral infarction without residual deficits: Secondary | ICD-10-CM | POA: Diagnosis not present

## 2022-11-16 DIAGNOSIS — M1711 Unilateral primary osteoarthritis, right knee: Secondary | ICD-10-CM | POA: Diagnosis not present

## 2022-12-01 DIAGNOSIS — I739 Peripheral vascular disease, unspecified: Secondary | ICD-10-CM | POA: Diagnosis not present

## 2022-12-01 DIAGNOSIS — M1711 Unilateral primary osteoarthritis, right knee: Secondary | ICD-10-CM | POA: Diagnosis not present

## 2022-12-01 DIAGNOSIS — I5032 Chronic diastolic (congestive) heart failure: Secondary | ICD-10-CM | POA: Diagnosis not present

## 2022-12-16 DIAGNOSIS — M1711 Unilateral primary osteoarthritis, right knee: Secondary | ICD-10-CM | POA: Diagnosis not present

## 2022-12-16 DIAGNOSIS — Z8673 Personal history of transient ischemic attack (TIA), and cerebral infarction without residual deficits: Secondary | ICD-10-CM | POA: Diagnosis not present

## 2022-12-16 DIAGNOSIS — R5381 Other malaise: Secondary | ICD-10-CM | POA: Diagnosis not present

## 2022-12-16 DIAGNOSIS — I739 Peripheral vascular disease, unspecified: Secondary | ICD-10-CM | POA: Diagnosis not present

## 2022-12-29 ENCOUNTER — Encounter: Payer: Self-pay | Admitting: Student

## 2022-12-29 ENCOUNTER — Other Ambulatory Visit (HOSPITAL_COMMUNITY): Payer: Self-pay

## 2022-12-29 ENCOUNTER — Ambulatory Visit: Payer: 59 | Admitting: Student

## 2022-12-29 VITALS — BP 201/85 | HR 80 | Temp 97.8°F | Ht 64.0 in | Wt 190.4 lb

## 2022-12-29 DIAGNOSIS — R634 Abnormal weight loss: Secondary | ICD-10-CM

## 2022-12-29 DIAGNOSIS — E1151 Type 2 diabetes mellitus with diabetic peripheral angiopathy without gangrene: Secondary | ICD-10-CM

## 2022-12-29 DIAGNOSIS — Z794 Long term (current) use of insulin: Secondary | ICD-10-CM | POA: Diagnosis not present

## 2022-12-29 DIAGNOSIS — N6321 Unspecified lump in the left breast, upper outer quadrant: Secondary | ICD-10-CM

## 2022-12-29 DIAGNOSIS — H9190 Unspecified hearing loss, unspecified ear: Secondary | ICD-10-CM

## 2022-12-29 DIAGNOSIS — Z7984 Long term (current) use of oral hypoglycemic drugs: Secondary | ICD-10-CM | POA: Diagnosis not present

## 2022-12-29 DIAGNOSIS — I739 Peripheral vascular disease, unspecified: Secondary | ICD-10-CM

## 2022-12-29 DIAGNOSIS — I152 Hypertension secondary to endocrine disorders: Secondary | ICD-10-CM

## 2022-12-29 DIAGNOSIS — E1159 Type 2 diabetes mellitus with other circulatory complications: Secondary | ICD-10-CM | POA: Diagnosis not present

## 2022-12-29 DIAGNOSIS — H903 Sensorineural hearing loss, bilateral: Secondary | ICD-10-CM

## 2022-12-29 LAB — POCT GLYCOSYLATED HEMOGLOBIN (HGB A1C): Hemoglobin A1C: 7 % — AB (ref 4.0–5.6)

## 2022-12-29 LAB — GLUCOSE, CAPILLARY: Glucose-Capillary: 129 mg/dL — ABNORMAL HIGH (ref 70–99)

## 2022-12-29 MED ORDER — CILOSTAZOL 100 MG PO TABS
100.0000 mg | ORAL_TABLET | Freq: Two times a day (BID) | ORAL | 1 refills | Status: AC
  Filled 2022-12-29: qty 180, 90d supply, fill #0
  Filled 2023-06-01: qty 180, 90d supply, fill #1

## 2022-12-29 MED ORDER — EMPAGLIFLOZIN 10 MG PO TABS
10.0000 mg | ORAL_TABLET | Freq: Every day | ORAL | 1 refills | Status: DC
  Filled 2022-12-29: qty 90, 90d supply, fill #0
  Filled 2023-06-01: qty 90, 90d supply, fill #1

## 2022-12-29 MED ORDER — LOSARTAN POTASSIUM 50 MG PO TABS
50.0000 mg | ORAL_TABLET | Freq: Every day | ORAL | 3 refills | Status: DC
  Filled 2022-12-29: qty 90, 90d supply, fill #0
  Filled 2023-06-01: qty 90, 90d supply, fill #1
  Filled 2023-09-24: qty 90, 90d supply, fill #2

## 2022-12-29 MED ORDER — EMPAGLIFLOZIN 10 MG PO TABS
10.0000 mg | ORAL_TABLET | Freq: Every day | ORAL | 2 refills | Status: DC
  Filled 2022-12-29: qty 30, 30d supply, fill #0

## 2022-12-29 NOTE — Progress Notes (Signed)
Established Patient Office Visit  Subjective   Patient ID: [name], female    DOB: April 30, 1933  Age: 89 y.o. MRN: [id number]  Chief Complaint  Patient presents with   Diabetes   Leg Swelling   Headache    "About this time everyday"     Flu Vaccine    Patient is an 89 yo with a past medical history stated below who presents today for follow-up for HTN, T2DM, left breast mass, hearing loss, and weight loss. She is requesting some medication refills today. She is accompanied by her daughter and her daughter's spouse. Please see problem based assessment and plan for additional details.     Past Medical History:  Diagnosis Date   Adenocarcinoma of lung (HCC)     Right upper lobe adenocarcinoma. s/p right lower lobectomy 12/15/10   ARTHRITIS, KNEE 03/24/2006   CHF (congestive heart failure) (HCC)    CVA (cerebrovascular accident) (HCC) 2006   right embolic stroke, no residual deficits   Depression 12/02/2010   DIABETES MELLITUS, TYPE II 2000   Diverticulitis    GERD 12/04/2005   Hemangioma of liver 12/02/2010   History of meningioma of the brain 07/21/2010   Per MRI 10/2010 stable.    History of pulmonary embolism 04/06/2013   HYPERLIPIDEMIA 12/04/2005   HYPERTENSION 12/04/2005   Meningioma (HCC) 07/21/2010   Right upper lobe, Adenocarcinoma of lung    11/09 - BAL reactive findings no malignancy. 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 Last him him 03/2008 when she fell out of care.  Last imagining 2/10 - stable to minimal increase in RUL mass. CT chest 11/12/10 increasing in size PET scan 11/18/10 Interval increase in size and metabolic activity    Rotator cuff arthropathy 05/19/2021   Review of Systems  HENT:  Positive for hearing loss.   Cardiovascular:  Negative for chest pain.  Genitourinary:  Negative for dysuria.  Neurological:  Positive for headaches.      Objective:     BP (!) 201/85 (BP Location: Right Arm, Patient Position: Sitting, Cuff Size: Normal)   Pulse 80   Temp 97.8 F (36.6 C) (Core)   Ht 5\' 4"  (1.626 m)   Wt 190 lb 6.4 oz (86.4 kg)   SpO2 100%   BMI 32.68 kg/m  BP Readings from Last 3 Encounters:  12/29/22 (!) 201/85  09/28/22 114/67  07/21/22 (!) 132/58   Wt Readings from Last 3 Encounters:  12/29/22 190 lb 6.4 oz (86.4 kg)  09/28/22 181 lb 14.1 oz (82.5 kg)  07/21/22 181 lb 14.4 oz (82.5 kg)   Physical Exam HENT:     Head: Normocephalic and atraumatic.  Cardiovascular:     Rate and Rhythm: Normal rate and regular rhythm.     Heart sounds: Normal heart sounds.  Pulmonary:     Effort: Pulmonary effort is normal.  Abdominal:     General: Bowel sounds are normal.     Palpations: Abdomen is soft.  Skin:    General: Skin is warm and dry.  Neurological:     Mental Status: Mental status is at baseline.  Psychiatric:        Mood and Affect: Mood normal.        Behavior: Behavior normal.    Results for orders placed or performed in visit on 12/29/22  Glucose, capillary  Result Value Ref Range   Glucose-Capillary  129 (H) 70 - 99 mg/dL  POC Hbg Z6X  Result Value Ref Range   Hemoglobin A1C 7.0 (A) 4.0 - 5.6 %   HbA1c POC (<> result, manual entry)     HbA1c, POC (prediabetic range)     HbA1c, POC (controlled diabetic range)      The ASCVD Risk score (Arnett DK, et al., 2019) failed to calculate for the following reasons:   The 2019 ASCVD risk score is only valid for ages 87 to 5   The patient has a prior MI or stroke diagnosis    Assessment & Plan:   Problem List Items Addressed This Visit     Type 2 diabetes mellitus (HCC) - Primary    CBG 129 and Hgb A1c today 7.0%, improved from 7.7% in April. Patient has been taking Jardiance 10 mg daily. Stopped taking Metformin 500 mg with breakfast around October as it was causing significant loose stools. Patient did go see podiatry, recent foot exam. Will  continue to follow up with them for foot concerns, none today. Denied dysuria or low blood glucose levels. Discussed continuing on Jardiance 10 mg daily for now given that her Hgb A1c is at goal. Will continue to monitor at follow up visits in the event that another agent needs to be added for improved glucose control.  - Continue Jardiance 10 mg daily       Relevant Medications   losartan (COZAAR) 50 MG tablet   empagliflozin (JARDIANCE) 10 MG TABS tablet   Other Relevant Orders   POC Hbg A1C (Completed)   Hypertension associated with diabetes (HCC)    Patient's BP 197/76 and 201/85 today. She denies any CP, SOB, heart palpitations, N/V, or abdominal pain. Does endorse headaches, although she has had similar headaches in the past. Could be tension headaches or associated with elevated blood pressure. Per patient's daughter, she has been taking Coreg 6.25 mg BID but may not have received her losartan 50 mg daily in the last set of medication refills she received from [facility]. Discussed importance of calling office when that happens so that the patient does not go without her antihypertensive medications. CV exam unremarkable today. Recommended checking patient's BP at home and keeping a log to help determine if she continues to have elevated BP once she resumes all of her medications. Sent in refill for losartan today. Discussed signs and symptoms associated with high blood pressure that would indicate a need to call the clinic, call 911, and/or go to the ED. Patient's daughter acknowledged understanding.  - Continue carvedilol/coreg 6.25 mg BID with meals and losartan 50 mg daily  - Will check BP log at next [facility] visit to help determine need to adjust HTN medications       Relevant Medications   losartan (COZAAR) 50 MG tablet   empagliflozin (JARDIANCE) 10 MG TABS tablet   Weight loss    Patient with concerns for 20 lb weight loss at last [facility] appointment on 07/22/22. Patient's weight  181 lb 8/12, 190 lb today. Patient has been eating about the same. Does endorse some fatigue. Has not followed up on diagnostic mammogram. Will monitor for now as patient seems to have an appetite and weight gain. Recommended completing diagnostic mammogram and breast US.  - Follow up on weight/imaging at upcoming [facility] appointment in December      Hearing loss    Patient has difficulty with hearing bilaterally. Needed assistance from daughter to participate in today's conversation. Per patient's  daughter, patient has been evaluated in the past but was lost to follow up after determining she needed hearing aids. Helped locate office and contact information for the patient and her family ([facility] [phone]). They will call to help re-establish follow up. Ask at next [facility] visit if they were able to continue process or if they need a new audiology referral.         Left breast mass    Patient seen for left breast lump 07/22/22. Discussed further evaluation with diagnostic mammogram and U/S. Per patient's daughter, patient and family members had COVID when imaging was originally scheduled. Has not called back imaging center to reschedule. Discussed importance of following up given patient continues to have similar sized mass in the upper outer quadrant of left breast. Patient's daughter acknowledged understanding.  - Will assist patient to follow up on breast US and diagnostic mammogram so these can be scheduled      Return in about 4 weeks (around 01/26/2023) for Blood pressure check and BP/T2DM medication management .  Patient seen with Dr. Oswaldo Done.   Eduardo Wurth Colbert Coyer, MD

## 2022-12-29 NOTE — Patient Instructions (Signed)
Thank you, [name] for allowing [place] to provide your care today. Today we discussed your diabetes, blood pressure, hearing aids, and your mammogram.   I have ordered the following labs for you:   Lab Orders         Glucose, capillary         POC Hbg A1C      Tests ordered today:  - None  Referrals ordered today:   Referral Orders  No referral(s) requested today     I have ordered the following medication/changed the following medications:   Stop the following medications: Medications Discontinued During This Encounter  Medication Reason   empagliflozin (JARDIANCE) 10 MG TABS tablet Reorder   losartan (COZAAR) 50 MG tablet Reorder   empagliflozin (JARDIANCE) 10 MG TABS tablet    cilostazol (PLETAL) 100 MG tablet Reorder     Start the following medications: Meds ordered this encounter  Medications   losartan (COZAAR) 50 MG tablet    Sig: Take 1 tablet (50 mg total) by mouth daily.    Dispense:  90 tablet    Refill:  3   DISCONTD: empagliflozin (JARDIANCE) 10 MG TABS tablet    Sig: Take 1 tablet (10 mg total) by mouth daily.    Dispense:  30 tablet    Refill:  2   empagliflozin (JARDIANCE) 10 MG TABS tablet    Sig: Take 1 tablet (10 mg total) by mouth daily.    Dispense:  90 tablet    Refill:  1   cilostazol (PLETAL) 100 MG tablet    Sig: Take 1 tablet (100 mg total) by mouth 2 (two) times daily.    Dispense:  180 tablet    Refill:  1     Follow up: 4 weeks   Remember:   - [facility] [phone], please give them a call to follow up on prior evaluation for hearing aids.   - We will assist you with finding out where your mammogram order was sent so that appointment can be re-scheduled.   - I have sent in refills, please use MyChart or give [place] a call a few days before you are about to run out in order to send in the refill in a timely manner.   - Please keep a log of BP, taken around the same time every day, so that we can  determine if the Coreg twice a day and the Losartan once a day is helping to manage your blood pressure.   Should you have any questions or concerns please call the internal medicine clinic at [phone].     [name] Silfies Colbert Coyer, MD PGY-1 Internal Medicine Teaching Progam [facility]

## 2022-12-30 ENCOUNTER — Other Ambulatory Visit: Payer: Self-pay

## 2022-12-30 ENCOUNTER — Other Ambulatory Visit (HOSPITAL_COMMUNITY): Payer: Self-pay

## 2022-12-30 NOTE — Assessment & Plan Note (Addendum)
Patient with concerns for 20 lb weight loss at last [facility] appointment on 07/22/22. Patient's weight 181 lb 8/12, 190 lb today. Patient has been eating about the same. Does endorse some fatigue. Has not followed up on diagnostic mammogram. Will monitor for now as patient seems to have an appetite and weight gain. Recommended completing diagnostic mammogram and breast US.  - Follow up on weight/imaging at upcoming [facility] appointment in December

## 2022-12-30 NOTE — Assessment & Plan Note (Signed)
Patient's BP 197/76 and 201/85 today. She denies any CP, SOB, heart palpitations, N/V, or abdominal pain. Does endorse headaches, although she has had similar headaches in the past. Could be tension headaches or associated with elevated blood pressure. Per patient's daughter, she has been taking Coreg 6.25 mg BID but may not have received her losartan 50 mg daily in the last set of medication refills she received from [employer]. Discussed importance of calling office when that happens so that the patient does not go without her antihypertensive medications. CV exam unremarkable today. Recommended checking patient's BP at home and keeping a log to help determine if she continues to have elevated BP once she resumes all of her medications. Sent in refill for losartan today. Discussed signs and symptoms associated with high blood pressure that would indicate a need to call the clinic, call 911, and/or go to the ED. Patient's daughter acknowledged understanding.  - Continue carvedilol/coreg 6.25 mg BID with meals and losartan 50 mg daily  - Will check BP log at next [facility] visit to help determine need to adjust HTN medications

## 2022-12-30 NOTE — Assessment & Plan Note (Signed)
CBG 129 and Hgb A1c today 7.0%, improved from 7.7% in April. Patient has been taking Jardiance 10 mg daily. Stopped taking Metformin 500 mg with breakfast around October as it was causing significant loose stools. Patient did go see podiatry, recent foot exam. Will continue to follow up with them for foot concerns, none today. Denied dysuria or low blood glucose levels. Discussed continuing on Jardiance 10 mg daily for now given that her Hgb A1c is at goal. Will continue to monitor at follow up visits in the event that another agent needs to be added for improved glucose control.  - Continue Jardiance 10 mg daily

## 2022-12-30 NOTE — Assessment & Plan Note (Addendum)
Patient has difficulty with hearing bilaterally. Needed assistance from daughter to participate in today's conversation. Per patient's daughter, patient has been evaluated in the past but was lost to follow up after determining she needed hearing aids. Helped locate office and contact information for the patient and her family ([facility] [phone]). They will call to help re-establish follow up. Ask at next [facility] visit if they were able to continue process or if they need a new audiology referral.

## 2022-12-30 NOTE — Assessment & Plan Note (Signed)
Patient seen for left breast lump 07/22/22. Discussed further evaluation with diagnostic mammogram and U/S. Per patient's daughter, patient and family members had COVID when imaging was originally scheduled. Has not called back imaging center to reschedule. Discussed importance of following up given patient continues to have similar sized mass in the upper outer quadrant of left breast. Patient's daughter acknowledged understanding.  - Will assist patient to follow up on breast US and diagnostic mammogram so these can be scheduled

## 2022-12-31 NOTE — Progress Notes (Signed)
Internal Medicine Clinic Attending  I was physically present during the key portions of the resident provided service and participated in the medical decision making of patient's management care. I reviewed pertinent patient test results.  The assessment, diagnosis, and plan were formulated together and I agree with the documentation in the resident's note.  Erlinda Hong, MD FACP

## 2023-01-01 DIAGNOSIS — I739 Peripheral vascular disease, unspecified: Secondary | ICD-10-CM | POA: Diagnosis not present

## 2023-01-01 DIAGNOSIS — I5032 Chronic diastolic (congestive) heart failure: Secondary | ICD-10-CM | POA: Diagnosis not present

## 2023-01-01 DIAGNOSIS — M1711 Unilateral primary osteoarthritis, right knee: Secondary | ICD-10-CM | POA: Diagnosis not present

## 2023-01-16 DIAGNOSIS — M1711 Unilateral primary osteoarthritis, right knee: Secondary | ICD-10-CM | POA: Diagnosis not present

## 2023-01-16 DIAGNOSIS — Z8673 Personal history of transient ischemic attack (TIA), and cerebral infarction without residual deficits: Secondary | ICD-10-CM | POA: Diagnosis not present

## 2023-01-16 DIAGNOSIS — R5381 Other malaise: Secondary | ICD-10-CM | POA: Diagnosis not present

## 2023-01-16 DIAGNOSIS — I739 Peripheral vascular disease, unspecified: Secondary | ICD-10-CM | POA: Diagnosis not present

## 2023-01-27 ENCOUNTER — Encounter: Payer: 59 | Admitting: Student

## 2023-01-31 DIAGNOSIS — I739 Peripheral vascular disease, unspecified: Secondary | ICD-10-CM | POA: Diagnosis not present

## 2023-01-31 DIAGNOSIS — I5032 Chronic diastolic (congestive) heart failure: Secondary | ICD-10-CM | POA: Diagnosis not present

## 2023-01-31 DIAGNOSIS — M1711 Unilateral primary osteoarthritis, right knee: Secondary | ICD-10-CM | POA: Diagnosis not present

## 2023-02-15 DIAGNOSIS — I739 Peripheral vascular disease, unspecified: Secondary | ICD-10-CM | POA: Diagnosis not present

## 2023-02-15 DIAGNOSIS — R5381 Other malaise: Secondary | ICD-10-CM | POA: Diagnosis not present

## 2023-02-15 DIAGNOSIS — Z8673 Personal history of transient ischemic attack (TIA), and cerebral infarction without residual deficits: Secondary | ICD-10-CM | POA: Diagnosis not present

## 2023-02-15 DIAGNOSIS — M1711 Unilateral primary osteoarthritis, right knee: Secondary | ICD-10-CM | POA: Diagnosis not present

## 2023-02-16 ENCOUNTER — Inpatient Hospital Stay: Admission: RE | Admit: 2023-02-16 | Payer: 59 | Source: Ambulatory Visit

## 2023-02-16 ENCOUNTER — Other Ambulatory Visit: Payer: 59

## 2023-02-24 ENCOUNTER — Encounter: Payer: 59 | Admitting: Internal Medicine

## 2023-02-24 NOTE — Progress Notes (Deleted)
 HTN BP ***. OP regimen is carfedilol 6.24 mg BID, losartan  50 mg daily which she is*** compliant with. She has*** been checking her BP at home:***. BMP last checked 09/2022 with abnormal renal function, BUN/creatinine 29/1.39 and GFR 37, previously normal. Plan:  Dm HbA1c last checked 12/2022 7.0%, today ***. OP regimen is jardiance  10 mg daily. She does*** check her blood sugar at home. Urine microalbumin/creatinine ratio last checked ***. Last eye exam ***. Plan:

## 2023-03-03 DIAGNOSIS — I739 Peripheral vascular disease, unspecified: Secondary | ICD-10-CM | POA: Diagnosis not present

## 2023-03-03 DIAGNOSIS — I5032 Chronic diastolic (congestive) heart failure: Secondary | ICD-10-CM | POA: Diagnosis not present

## 2023-03-03 DIAGNOSIS — M1711 Unilateral primary osteoarthritis, right knee: Secondary | ICD-10-CM | POA: Diagnosis not present

## 2023-03-18 DIAGNOSIS — R5381 Other malaise: Secondary | ICD-10-CM | POA: Diagnosis not present

## 2023-03-18 DIAGNOSIS — M1711 Unilateral primary osteoarthritis, right knee: Secondary | ICD-10-CM | POA: Diagnosis not present

## 2023-03-18 DIAGNOSIS — Z8673 Personal history of transient ischemic attack (TIA), and cerebral infarction without residual deficits: Secondary | ICD-10-CM | POA: Diagnosis not present

## 2023-03-18 DIAGNOSIS — I739 Peripheral vascular disease, unspecified: Secondary | ICD-10-CM | POA: Diagnosis not present

## 2023-03-25 ENCOUNTER — Other Ambulatory Visit: Payer: Self-pay | Admitting: Internal Medicine

## 2023-03-25 ENCOUNTER — Ambulatory Visit
Admission: RE | Admit: 2023-03-25 | Discharge: 2023-03-25 | Disposition: A | Discharge status: Home / Self Care | Payer: 59 | Source: Ambulatory Visit | Attending: Internal Medicine | Admitting: Internal Medicine

## 2023-03-25 DIAGNOSIS — N6325 Unspecified lump in the left breast, overlapping quadrants: Secondary | ICD-10-CM | POA: Diagnosis not present

## 2023-03-25 DIAGNOSIS — R928 Other abnormal and inconclusive findings on diagnostic imaging of breast: Secondary | ICD-10-CM

## 2023-03-25 DIAGNOSIS — N6323 Unspecified lump in the left breast, lower outer quadrant: Secondary | ICD-10-CM | POA: Diagnosis not present

## 2023-03-25 DIAGNOSIS — N6321 Unspecified lump in the left breast, upper outer quadrant: Secondary | ICD-10-CM | POA: Diagnosis not present

## 2023-03-25 DIAGNOSIS — N6311 Unspecified lump in the right breast, upper outer quadrant: Secondary | ICD-10-CM | POA: Diagnosis not present

## 2023-03-29 ENCOUNTER — Other Ambulatory Visit: Payer: Self-pay | Admitting: Internal Medicine

## 2023-03-29 DIAGNOSIS — N6310 Unspecified lump in the right breast, unspecified quadrant: Secondary | ICD-10-CM

## 2023-03-29 DIAGNOSIS — N6320 Unspecified lump in the left breast, unspecified quadrant: Secondary | ICD-10-CM

## 2023-03-29 DIAGNOSIS — R921 Mammographic calcification found on diagnostic imaging of breast: Secondary | ICD-10-CM

## 2023-04-02 ENCOUNTER — Other Ambulatory Visit (HOSPITAL_COMMUNITY): Payer: Self-pay | Admitting: Interventional Radiology

## 2023-04-02 DIAGNOSIS — I739 Peripheral vascular disease, unspecified: Secondary | ICD-10-CM

## 2023-04-06 ENCOUNTER — Ambulatory Visit
Admission: RE | Admit: 2023-04-06 | Discharge: 2023-04-06 | Disposition: A | Discharge status: Home / Self Care | Payer: 59 | Source: Ambulatory Visit | Attending: Internal Medicine | Admitting: Internal Medicine

## 2023-04-06 ENCOUNTER — Other Ambulatory Visit: Payer: Self-pay | Admitting: Internal Medicine

## 2023-04-06 ENCOUNTER — Other Ambulatory Visit (HOSPITAL_COMMUNITY): Payer: Self-pay | Admitting: Diagnostic Radiology

## 2023-04-06 DIAGNOSIS — N6042 Mammary duct ectasia of left breast: Secondary | ICD-10-CM | POA: Diagnosis not present

## 2023-04-06 DIAGNOSIS — I739 Peripheral vascular disease, unspecified: Secondary | ICD-10-CM

## 2023-04-06 DIAGNOSIS — N6320 Unspecified lump in the left breast, unspecified quadrant: Secondary | ICD-10-CM

## 2023-04-06 DIAGNOSIS — N6002 Solitary cyst of left breast: Secondary | ICD-10-CM | POA: Diagnosis not present

## 2023-04-06 DIAGNOSIS — N6323 Unspecified lump in the left breast, lower outer quadrant: Secondary | ICD-10-CM | POA: Diagnosis not present

## 2023-04-06 DIAGNOSIS — R921 Mammographic calcification found on diagnostic imaging of breast: Secondary | ICD-10-CM

## 2023-04-06 DIAGNOSIS — N649 Disorder of breast, unspecified: Secondary | ICD-10-CM | POA: Diagnosis not present

## 2023-04-06 DIAGNOSIS — N6321 Unspecified lump in the left breast, upper outer quadrant: Secondary | ICD-10-CM

## 2023-04-06 DIAGNOSIS — N6310 Unspecified lump in the right breast, unspecified quadrant: Secondary | ICD-10-CM

## 2023-04-06 DIAGNOSIS — N6325 Unspecified lump in the left breast, overlapping quadrants: Secondary | ICD-10-CM | POA: Diagnosis not present

## 2023-04-06 DIAGNOSIS — N62 Hypertrophy of breast: Secondary | ICD-10-CM | POA: Diagnosis not present

## 2023-04-06 HISTORY — PX: BREAST BIOPSY: SHX20

## 2023-04-07 LAB — SURGICAL PATHOLOGY

## 2023-04-13 ENCOUNTER — Other Ambulatory Visit: Payer: Self-pay | Admitting: Internal Medicine

## 2023-04-13 ENCOUNTER — Ambulatory Visit
Admission: RE | Admit: 2023-04-13 | Discharge: 2023-04-13 | Disposition: A | Discharge status: Home / Self Care | Payer: 59 | Source: Ambulatory Visit | Attending: Internal Medicine | Admitting: Internal Medicine

## 2023-04-13 DIAGNOSIS — R921 Mammographic calcification found on diagnostic imaging of breast: Secondary | ICD-10-CM

## 2023-04-13 DIAGNOSIS — N6310 Unspecified lump in the right breast, unspecified quadrant: Secondary | ICD-10-CM

## 2023-04-13 DIAGNOSIS — N6320 Unspecified lump in the left breast, unspecified quadrant: Secondary | ICD-10-CM

## 2023-04-13 DIAGNOSIS — N6311 Unspecified lump in the right breast, upper outer quadrant: Secondary | ICD-10-CM | POA: Diagnosis not present

## 2023-04-16 DIAGNOSIS — M1711 Unilateral primary osteoarthritis, right knee: Secondary | ICD-10-CM | POA: Diagnosis not present

## 2023-04-16 DIAGNOSIS — Z8673 Personal history of transient ischemic attack (TIA), and cerebral infarction without residual deficits: Secondary | ICD-10-CM | POA: Diagnosis not present

## 2023-04-16 DIAGNOSIS — I739 Peripheral vascular disease, unspecified: Secondary | ICD-10-CM | POA: Diagnosis not present

## 2023-04-16 DIAGNOSIS — R5381 Other malaise: Secondary | ICD-10-CM | POA: Diagnosis not present

## 2023-05-06 ENCOUNTER — Ambulatory Visit: Admitting: Internal Medicine

## 2023-05-06 DIAGNOSIS — N3946 Mixed incontinence: Secondary | ICD-10-CM

## 2023-05-06 DIAGNOSIS — R5381 Other malaise: Secondary | ICD-10-CM

## 2023-05-06 DIAGNOSIS — Z8673 Personal history of transient ischemic attack (TIA), and cerebral infarction without residual deficits: Secondary | ICD-10-CM | POA: Diagnosis not present

## 2023-05-06 NOTE — Addendum Note (Signed)
 Addended by: Lucille Passy on: 05/06/2023 11:15 AM   Modules accepted: Orders

## 2023-05-06 NOTE — Addendum Note (Signed)
 Addended by: Lucille Passy on: 05/06/2023 11:13 AM   Modules accepted: Orders

## 2023-05-06 NOTE — Assessment & Plan Note (Signed)
 89 year old with HFpEF, PAD, history of CVA, and urinary incontinence. Incontinence is multi factorial in setting of functional, stress, and urge. Ms. Formosa lives with her daughter who is able to help her at home. They recently moved from a hotel to an apartment complex last week. She does have difficulty repositioning herself in a standard bed and her daughter requests a hospital bed for that reason. She uses a wheelchair to move outside of her home and is mostly sitting when at home. She also requests a shower chair to help with safety for her mom.  The patient requires a hospital bed because he/she requires positioning of the body in ways not feasible with an ordinary bed or to relieve pain.  The patient requires a regular hospital bed.  A gel overlay is not required. the patient has fecal or urinary incontinence.  P: Hospital bed and shower chair order placed DME referral

## 2023-05-06 NOTE — Progress Notes (Addendum)
   I connected with  [name] on 05/06/23 by telephone and verified that I am speaking with the correct person using two identifiers.   I discussed the limitations of evaluation and management by telemedicine. The patient expressed understanding and agreed to proceed.  CC: incontinence supplies  This is a telephone encounter between [name] and [name] on 05/06/2023 for incontinence supplies. The visit was conducted with the patient located at home and [name] at [facility]. The patient's identity was confirmed using their DOB and current address. The patient has consented to being evaluated through a telephone encounter and understands the associated risks (an examination cannot be done and the patient may need to come in for an appointment) / benefits (allows the patient to remain at home, decreasing exposure to coronavirus). I personally spent 9 minutes on medical discussion.   HPI:  [name] is a 89 y.o. with PMH as below.   Please see A&P for assessment of the patient's acute and chronic medical conditions.   Past Medical History:  Diagnosis Date   Adenocarcinoma of lung (HCC)     Right upper lobe adenocarcinoma. s/p right lower lobectomy 12/15/10   ARTHRITIS, KNEE 03/24/2006   CHF (congestive heart failure) (HCC)    CVA (cerebrovascular accident) (HCC) 2006   right embolic stroke, no residual deficits   Depression 12/02/2010   DIABETES MELLITUS, TYPE II 2000   Diverticulitis    GERD 12/04/2005   Hemangioma of liver 12/02/2010   History of meningioma of the brain 07/21/2010   Per MRI 10/2010 stable.    History of pulmonary embolism 04/06/2013   HYPERLIPIDEMIA 12/04/2005   HYPERTENSION 12/04/2005   Meningioma (HCC) 07/21/2010   Right upper lobe, Adenocarcinoma of lung    11/09 - BAL reactive findings no malignancy. 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 Last him him 03/2008 when she fell out of care.  Last imagining 2/10 - stable to minimal increase in RUL mass. CT chest 11/12/10 increasing in size PET scan 11/18/10 Interval increase in size and metabolic activity    Rotator cuff arthropathy 05/19/2021   Review of Systems:  denies dysuria,   Assessment & Plan:   Incontinence of urine 89 year old with HFpEF, PAD, history of CVA, and urinary incontinence. Incontinence is multi factorial in setting of functional, stress, and urge. [name] lives with her daughter who is able to help her at home. They recently moved from a hotel to an apartment complex last week. She does have difficulty repositioning herself in a standard bed and her daughter requests a hospital bed for that reason. She uses a wheelchair to move outside of her home and is mostly sitting when at home. She also requests a shower chair to help with safety for her mom.  The patient requires a hospital bed because he/she requires positioning of the body in ways not feasible with an ordinary bed or to relieve pain.  The patient requires a regular hospital bed.  A gel overlay is not required. the patient has fecal or urinary incontinence.  P: Hospital bed and shower chair order placed DME referral  Patient discussed with Dr. Wille Celeste [name], D.O. [facility] Internal Medicine  PGY-3 Pager: [phone]  Phone: [phone] Date 05/06/2023  Time 11:13 AM

## 2023-05-11 DIAGNOSIS — R5381 Other malaise: Secondary | ICD-10-CM | POA: Diagnosis not present

## 2023-05-14 DIAGNOSIS — M1711 Unilateral primary osteoarthritis, right knee: Secondary | ICD-10-CM | POA: Diagnosis not present

## 2023-05-14 DIAGNOSIS — I739 Peripheral vascular disease, unspecified: Secondary | ICD-10-CM | POA: Diagnosis not present

## 2023-05-14 DIAGNOSIS — Z8673 Personal history of transient ischemic attack (TIA), and cerebral infarction without residual deficits: Secondary | ICD-10-CM | POA: Diagnosis not present

## 2023-05-14 DIAGNOSIS — R5381 Other malaise: Secondary | ICD-10-CM | POA: Diagnosis not present

## 2023-05-15 NOTE — Progress Notes (Signed)
 Internal Medicine Clinic Attending  Case discussed with the resident at the time of the visit.  We reviewed the resident's history and exam and pertinent patient test results.  I agree with the assessment, diagnosis, and plan of care documented in the resident's note.

## 2023-06-01 ENCOUNTER — Other Ambulatory Visit (HOSPITAL_COMMUNITY): Payer: Self-pay

## 2023-06-07 ENCOUNTER — Telehealth: Payer: Self-pay | Admitting: Physician Assistant

## 2023-06-07 NOTE — Telephone Encounter (Signed)
 Patient with history of critical limb ischemia s/p left lower extremity multi segment angioplasty/stent placement in IR (Dr. Mabel Savage) 04/02/22. She was discharged on Plavix  due to presence of stents and was scheduled to be seen in outpatient follow up however upon chart review it appears this did not occur.  Received request for refill of Plavix  75 mg PO every day last week. The original prescription was written on 04/02/22 with 3 refills. Per chart review patient does not have Plavix  listed as a current medication as recently as 05/06/23. She is currently taking Pletal  for PAD per most recent internal medicine note, no mention of when or why Plavix  was discontinued in this note.  Discussed with Dr. Mabel Savage and we will not be refilling Plavix  until she follows up with IR. She does not have any appointments scheduled with us  in Epic.  No refill will be provided, attempted to call patient to let her know without success.  Please place an order for IR follow up if patient would like to discuss recurrent symptoms or refill of Plavix .  Nathan Bake, PA-C

## 2023-06-11 DIAGNOSIS — R5381 Other malaise: Secondary | ICD-10-CM | POA: Diagnosis not present

## 2023-06-14 DIAGNOSIS — R5381 Other malaise: Secondary | ICD-10-CM | POA: Diagnosis not present

## 2023-06-14 DIAGNOSIS — Z8673 Personal history of transient ischemic attack (TIA), and cerebral infarction without residual deficits: Secondary | ICD-10-CM | POA: Diagnosis not present

## 2023-06-14 DIAGNOSIS — I739 Peripheral vascular disease, unspecified: Secondary | ICD-10-CM | POA: Diagnosis not present

## 2023-06-14 DIAGNOSIS — M1711 Unilateral primary osteoarthritis, right knee: Secondary | ICD-10-CM | POA: Diagnosis not present

## 2023-06-16 ENCOUNTER — Ambulatory Visit (INDEPENDENT_AMBULATORY_CARE_PROVIDER_SITE_OTHER)

## 2023-06-16 VITALS — Ht 64.0 in | Wt 190.0 lb

## 2023-06-16 DIAGNOSIS — Z0000 Encounter for general adult medical examination without abnormal findings: Secondary | ICD-10-CM

## 2023-06-16 NOTE — Patient Instructions (Addendum)
 [name] , Thank you for taking time to come for your Medicare Wellness Visit. I appreciate your ongoing commitment to your health goals. Please review the following plan we discussed and let me know if I can assist you in the future.   Referrals/Orders/Follow-Ups/Clinician Recommendations: Yes, keep maintaining your health by keeping your appointments with Dr. Dorthy Gavia and any specialists that you may see.  Call us  if you need anything.  Have a great year!!!!  This is a list of the screening recommended for you and due dates:  Health Maintenance  Topic Date Due   Zoster (Shingles) Vaccine (1 of 2) Never done   Eye exam for diabetics  01/23/2022   Complete foot exam   05/20/2022   COVID-19 Vaccine (1 - 2024-25 season) Never done   Hemoglobin A1C  03/31/2023   Flu Shot  09/17/2023   Medicare Annual Wellness Visit  06/15/2024   DTaP/Tdap/Td vaccine (3 - Td or Tdap) 04/10/2029   Pneumonia Vaccine  Completed   DEXA scan (bone density measurement)  Completed   HPV Vaccine  Aged Out   Meningitis B Vaccine  Aged Out   Lipid (cholesterol) test  Discontinued    Advanced directives: (In Chart) A copy of your advanced directives are scanned into your chart should your provider ever need it.  Next Medicare Annual Wellness Visit scheduled for next year: Yes, 06/21/2024 at 3:40 video visit with nurse.

## 2023-06-16 NOTE — Progress Notes (Signed)
 Because this visit was a virtual/telehealth visit,  certain criteria was not obtained, such a blood pressure, CBG if applicable, and timed get up and go. Any medications not marked as "taking" were not mentioned during the medication reconciliation part of the visit. Any vitals not documented were not able to be obtained due to this being a telehealth visit or patient was unable to self-report a recent blood pressure reading due to a lack of equipment at home via telehealth. Vitals that have been documented are verbally provided by the patient.   Subjective:   [name] is a 89 y.o. who presents for a Medicare Wellness preventive visit.  Visit Complete: Virtual I connected with  [name] on 06/16/23 by a video and audio enabled telemedicine application and verified that I am speaking with the correct person using two identifiers.  Patient Location: Home  Provider Location: Office/Clinic  I discussed the limitations of evaluation and management by telemedicine. The patient expressed understanding and agreed to proceed.  Vital Signs: Because this visit was a virtual/telehealth visit, some criteria may be missing or patient reported. Any vitals not documented were not able to be obtained and vitals that have been documented are patient reported.  Persons Participating in Visit:  Daughter and patient was present during visit.  AWV Questionnaire: No: Patient Medicare AWV questionnaire was not completed prior to this visit.  Cardiac Risk Factors include: advanced age (>26men, >32 women);dyslipidemia;family history of premature cardiovascular disease;hypertension;obesity (BMI >30kg/m2);sedentary lifestyle;diabetes mellitus     Objective:    Today's Vitals   06/16/23 1539  Weight: 190 lb (86.2 kg)  Height: 5\' 4"  (1.626 m)  PainSc: 0-No pain   Body mass index is 32.61 kg/m.     06/16/2023    3:47 PM 07/06/2022    3:34 PM 07/06/2022    3:05 PM 04/02/2022    7:15 AM  03/05/2022    3:32 PM 11/25/2021    3:49 PM 05/19/2021    2:18 PM  Advanced Directives  Does Patient Have a Medical Advance Directive? Yes Yes Yes Yes No No No  Type of Estate agent of [place];Living will Healthcare Power of [employer] Power of [employer] Power of Attorney     Does patient want to make changes to medical advance directive? No - Patient declined No - Patient declined  No - Guardian declined     Copy of Healthcare Power of Attorney in Chart? Yes - validated most recent copy scanned in chart (See row information) No - copy requested No - copy requested No - copy requested     Would patient like information on creating a medical advance directive?     Yes (MAU/Ambulatory/Procedural Areas - Information given) No - Patient declined No - Patient declined    Current Medications (verified) Outpatient Encounter Medications as of 06/16/2023  Medication Sig   Accu-Chek FastClix Lancets MISC CHECK BLOOD SUGAR THREE TIMES A DAY. DIAG CODE E11.9. INSULIN  DEPENDENT   acetaminophen -codeine  (TYLENOL  #3) 300-30 MG tablet TAKE 1 TABLET BY MOUTH DAILY AS NEEDED FOR MODERATE PAIN OR SEVERE PAIN. (Patient not taking: Reported on 09/22/2022)   aspirin  EC 81 MG tablet Take 1 tablet (81 mg total) by mouth daily.   barrier cream (NON-SPECIFIED) CREA Apply 1 application topically 2 (two) times daily as needed (place on vulva twice daily as needed for redness and swelling). (Patient not taking: Reported on 06/30/2022)   Blood Glucose Monitoring Suppl (ACCU-CHEK GUIDE) w/Device KIT 1 each  by Does not apply route 3 (three) times daily.   carvedilol  (COREG ) 6.25 MG tablet Take 1 tablet (6.25 mg total) by mouth 2 (two) times daily with a meal.   cilostazol  (PLETAL ) 100 MG tablet Take 1 tablet (100 mg total) by mouth 2 (two) times daily.   empagliflozin  (JARDIANCE ) 10 MG TABS tablet Take 1 tablet (10 mg total) by mouth daily.   FLUoxetine  (PROZAC ) 10 MG capsule Take 1 capsule  (10 mg total) by mouth daily.   glucose blood (ACCU-CHEK GUIDE) test strip Check blood sugar 3 times per day   Incontinence Supplies MISC For use to address bowel and bladder incontinence.   Insulin  Pen Needle (BD PEN NEEDLE NANO U/F) 32G X 4 MM MISC USE TO ADMINISTER SOLIQUA  ONE TIME A DAY (Patient not taking: Reported on 06/30/2022)   Insulin  Pen Needle 32G X 4 MM MISC Use to administer soliqua  one time a day   levETIRAcetam  (KEPPRA ) 750 MG tablet Take 1 tablet (750 mg total) by mouth 2 (two) times daily.   losartan  (COZAAR ) 50 MG tablet Take 1 tablet (50 mg total) by mouth daily.   omeprazole  (PRILOSEC) 20 MG capsule Take 1 capsule (20 mg total) by mouth daily.   rosuvastatin  (CRESTOR ) 20 MG tablet Take 1 tablet (20 mg total) by mouth daily.   [DISCONTINUED] metFORMIN  (GLUCOPHAGE -XR) 500 MG 24 hr tablet Take 1 tablet (500 mg total) by mouth daily with breakfast.   No facility-administered encounter medications on file as of 06/16/2023.    Allergies (verified) Patient has no known allergies.   History: Past Medical History:  Diagnosis Date   Adenocarcinoma of lung (HCC)     Right upper lobe adenocarcinoma. s/p right lower lobectomy 12/15/10   ARTHRITIS, KNEE 03/24/2006   CHF (congestive heart failure) (HCC)    CVA (cerebrovascular accident) (HCC) 2006   right embolic stroke, no residual deficits   Depression 12/02/2010   DIABETES MELLITUS, TYPE II 2000   Diverticulitis    GERD 12/04/2005   Hemangioma of liver 12/02/2010   History of meningioma of the brain 07/21/2010   Per MRI 10/2010 stable.    History of pulmonary embolism 04/06/2013   HYPERLIPIDEMIA 12/04/2005   HYPERTENSION 12/04/2005   Meningioma (HCC) 07/21/2010   Right upper lobe, Adenocarcinoma of lung    11/09 - BAL reactive findings no malignancy. 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 Last him him 03/2008 when she fell  out of care.  Last imagining 2/10 - stable to minimal increase in RUL mass. CT chest 11/12/10 increasing in size PET scan 11/18/10 Interval increase in size and metabolic activity    Rotator cuff arthropathy 05/19/2021   Past Surgical History:  Procedure Laterality Date   ABDOMINAL HYSTERECTOMY     BREAST BIOPSY Left 04/06/2023   US  LT BREAST BX W LOC DEV EA ADD LESION IMG BX SPEC US  GUIDE 04/06/2023 [facility]   BREAST BIOPSY Left 04/06/2023   US  LT BREAST BX W LOC DEV 1ST LESION IMG BX SPEC US  GUIDE 04/06/2023 [facility]   BREAST BIOPSY Left 04/06/2023   US  LT BREAST BX W LOC DEV EA ADD LESION IMG BX SPEC US  GUIDE 04/06/2023 [facility]   BREAST BIOPSY Left 04/06/2023   US  LT BREAST BX W LOC DEV EA ADD LESION IMG BX SPEC US  GUIDE 04/06/2023 [facility]   CRANIOTOMY N/A 03/02/2013   Procedure:  CRANIOTOMY TUMOR EXCISION;  Surgeon: Pasty Bongo, MD;  Location: [facility];  Service: Neurosurgery;  Laterality: N/A;  Bifrontal Craniotomy for tumor   Extracapsular cataract extraction with intraocular     lens implantation.   INCISION AND DRAINAGE PERIRECTAL ABSCESS N/A 01/19/2013   Procedure: IRRIGATION AND DEBRIDEMENT PERIRECTAL ABSCESS;  Surgeon: Cloyce Darby, MD;  Location: [facility];  Service: General;  Laterality: N/A;   IR ANGIOGRAM EXTREMITY LEFT  04/02/2022   IR ANGIOGRAM SELECTIVE EACH ADDITIONAL VESSEL  04/02/2022   IR ANGIOGRAM SELECTIVE EACH ADDITIONAL VESSEL  04/02/2022   IR AORTAGRAM ABDOMINAL SERIALOGRAM  04/02/2022   IR FEM POP ART STENT INC PTA MOD SED  04/02/2022   IR ILIAC ART STENT INC PTA MOD SED  04/02/2022   IR RADIOLOGIST EVAL & MGMT  03/12/2022   IR RADIOLOGIST EVAL & MGMT  03/24/2022   IR US  GUIDE VASC ACCESS LEFT  04/02/2022   IR US  GUIDE VASC ACCESS RIGHT  04/02/2022   RIGHT VATS,RIGHT THORACOTOMY,RIGHT LOWER LOBECTOMY WITH NODE DISSECTION     Video bronchoscope.  12/29/2007   [place]   Wide excision of left upper back mass.     Family History   Problem Relation Age of Onset   Hyperlipidemia Brother    Hypertension Brother    Diabetes Brother    Social History   Socioeconomic History   Marital status: Widowed    Spouse name: Not on file   Number of children: 5   Years of education: 10   Highest education level: Not on file  Occupational History    Employer: UNEMPLOYED  Tobacco Use   Smoking status: Former    Current packs/day: 0.00    Types: Cigarettes    Quit date: 02/17/2000    Years since quitting: 23.3   Smokeless tobacco: Never  Substance and Sexual Activity   Alcohol use: No    Alcohol/week: 0.0 standard drinks of alcohol   Drug use: No   Sexual activity: Not Currently  Other Topics Concern   Not on file  Social History Narrative   Patient requests to use [employer] [facility] for her diabetes testing supplies as of 09/05/2009.   Lives at home with daughter, [name].   Caffeine use: 1 cup coffee/day   Social Drivers of Corporate investment banker Strain: Medium Risk (06/16/2023)   Overall Financial Resource Strain (CARDIA)    Difficulty of Paying Living Expenses: Somewhat hard  Food Insecurity: No Food Insecurity (06/16/2023)   Hunger Vital Sign    Worried About Running Out of Food in the Last Year: Never true    Ran Out of Food in the Last Year: Never true  Transportation Needs: Unmet Transportation Needs (06/16/2023)   PRAPARE - Administrator, Civil Service (Medical): Yes    Lack of Transportation (Non-Medical): Yes  Physical Activity: Inactive (06/16/2023)   Exercise Vital Sign    Days of Exercise per Week: 0 days    Minutes of Exercise per Session: 0 min  Stress: No Stress Concern Present (06/16/2023)   [employer] of Occupational Health - Occupational Stress Questionnaire    Feeling of Stress : Not at all  Social Connections: Moderately Isolated (06/16/2023)   Social Connection and Isolation Panel [NHANES]    Frequency of Communication with Friends and Family: More than  three times a week    Frequency of Social Gatherings with Friends and Family: More than three times a week    Attends Religious  Services: More than 4 times per year    Active Member of Clubs or Organizations: No    Attends Banker Meetings: Never    Marital Status: Widowed    Tobacco Counseling Counseling given: Not Answered    Clinical Intake:  Pre-visit preparation completed: Yes  Pain : No/denies pain Pain Score: 0-No pain     BMI - recorded: 32.61 Nutritional Status: BMI > 30  Obese Nutritional Risks: None Diabetes: Yes CBG done?: No Did pt. bring in CBG monitor from home?: No  Lab Results  Component Value Date   HGBA1C 7.0 (A) 12/29/2022   HGBA1C 7.7 (A) 06/08/2022   HGBA1C 8.6 (A) 02/23/2022     How often do you need to have someone help you when you read instructions, pamphlets, or other written materials from your doctor or pharmacy?: 1 - Never  Interpreter Needed?: No  Information entered by :: Mahi Zabriskie N. Sonjia Wilcoxson, LPN.   Activities of Daily Living     06/16/2023    3:48 PM 07/06/2022    3:34 PM  In your present state of health, do you have any difficulty performing the following activities:  Hearing? 1 0  Vision? 0 0  Difficulty concentrating or making decisions? 1 0  Walking or climbing stairs? 1 0  Dressing or bathing? 1 0  Doing errands, shopping? 1 1  Preparing Food and eating ? Y   Using the Toilet? Y   In the past six months, have you accidently leaked urine? Y   Do you have problems with loss of bowel control? Y   Managing your Medications? Y   Managing your Finances? Y   Housekeeping or managing your Housekeeping? Y     Patient Care Team: Dorthy Gavia, MD as PCP - Yolande Hench, MD (Ophthalmology) Dominic Friendly Cordelia Dessert, MD as Consulting Physician (Gastroenterology)  Indicate any recent Medical Services you may have received from other than [facility] providers in the past year (date may be approximate).      Assessment:   This is a routine wellness examination for Allondra.  Hearing/Vision screen Hearing Screening - Comments:: Patient has hearing issues. 70% deaf bilaterally No hearing aids at this time. Vision Screening - Comments:: No eyeglasses - not up to date with routine eye exams.    Goals Addressed             This Visit's Progress    Daughter stated: We want to get the referrals to the eye doctor and hearing doctor.       06/16/2023       Depression Screen     06/16/2023    3:48 PM 07/06/2022    3:34 PM 07/06/2022    3:32 PM 07/06/2022    3:05 PM 03/05/2022    3:33 PM 02/23/2022    2:34 PM 11/25/2021    3:50 PM  PHQ 2/9 Scores  PHQ - 2 Score  0 0 0 6 2 2   PHQ- 9 Score     20 4 7   Exception Documentation Other- indicate reason in comment box        Not completed Patient has cognitive impairment.          Fall Risk     06/16/2023    3:47 PM 07/06/2022    3:34 PM 07/06/2022    3:05 PM 03/05/2022    3:31 PM 02/23/2022    2:33 PM  Fall Risk   Falls in the past year? 0 0 0 0  0  Number falls in past yr: 0 0 0 0   Injury with Fall? 0 0 0 0   Risk for fall due to : No Fall Risks No Fall Risks No Fall Risks Impaired balance/gait;Impaired mobility Impaired balance/gait  Follow up Falls prevention discussed;Falls evaluation completed Falls evaluation completed Falls evaluation completed Falls evaluation completed;Falls prevention discussed Falls evaluation completed    MEDICARE RISK AT HOME:  Medicare Risk at Home Any stairs in or around the home?: Yes (3RD FLOOR, NO ELEVATORS) If so, are there any without handrails?: No Home free of loose throw rugs in walkways, pet beds, electrical cords, etc?: Yes Adequate lighting in your home to reduce risk of falls?: Yes Life alert?: No Use of a cane, walker or w/c?: Yes Grab bars in the bathroom?: No Shower chair or bench in shower?: Yes Elevated toilet seat or a handicapped toilet?: No  TIMED UP AND GO:  Was the test  performed?  No  Cognitive Function: Unable: Due to language barrier, hearing or vision limitations or other cognitive issues.    06/16/2023    3:48 PM  MMSE - Mini Mental State Exam  Not completed: Unable to complete        07/06/2022    3:34 PM  6CIT Screen  What Year? --  What month? --  What time? --  Count back from 20 --  Months in reverse --    Immunizations Immunization History  Administered Date(s) Administered   Influenza Whole 11/29/2007, 11/27/2008, 12/16/2009   Influenza, Seasonal, Injecte, Preservative Fre 04/29/2012   Influenza,inj,Quad PF,6+ Mos 01/21/2013, 11/03/2013, 02/01/2017, 11/23/2017, 11/08/2018, 04/15/2020   Pneumococcal Conjugate-13 07/20/2014   Pneumococcal Polysaccharide-23 07/09/2010, 01/21/2013   Td 03/20/2009   Tdap 04/11/2019    Screening Tests Health Maintenance  Topic Date Due   Zoster Vaccines- Shingrix (1 of 2) Never done   OPHTHALMOLOGY EXAM  01/23/2022   FOOT EXAM  05/20/2022   COVID-19 Vaccine (1 - 2024-25 season) Never done   HEMOGLOBIN A1C  03/31/2023   INFLUENZA VACCINE  09/17/2023   Medicare Annual Wellness (AWV)  06/15/2024   DTaP/Tdap/Td (3 - Td or Tdap) 04/10/2029   Pneumonia Vaccine 13+ Years old  Completed   DEXA SCAN  Completed   HPV VACCINES  Aged Out   Meningococcal B Vaccine  Aged Out   LIPID PANEL  Discontinued    Health Maintenance  Health Maintenance Due  Topic Date Due   Zoster Vaccines- Shingrix (1 of 2) Never done   OPHTHALMOLOGY EXAM  01/23/2022   FOOT EXAM  05/20/2022   COVID-19 Vaccine (1 - 2024-25 season) Never done   HEMOGLOBIN A1C  03/31/2023   Health Maintenance Items Addressed: Yes Patient is needing an eye exam and hearing evaluation.  Per daughter, mother does not get any vaccines.  Additional Screening:  Vision Screening: Recommended annual ophthalmology exams for early detection of glaucoma and other disorders of the eye.  Dental Screening: Recommended annual dental exams for proper  oral hygiene  Community Resource Referral / Chronic Care Management: CRR required this visit?  No   CCM required this visit?  No     Plan:     I have personally reviewed and noted the following in the patient's chart:   Medical and social history Use of alcohol, tobacco or illicit drugs  Current medications and supplements including opioid prescriptions. Patient is not currently taking opioid prescriptions. Functional ability and status Nutritional status Physical activity Advanced directives List of other physicians  Hospitalizations, surgeries, and ER visits in previous 12 months Vitals Screenings to include cognitive, depression, and falls Referrals and appointments  In addition, I have reviewed and discussed with patient certain preventive protocols, quality metrics, and best practice recommendations. A written personalized care plan for preventive services as well as general preventive health recommendations were provided to patient.     Margette Sheldon, LPN   03/06/1476   After Visit Summary: (Declined) Due to this being a telephonic visit, with patients personalized plan was offered to patient but patient Declined AVS at this time   Notes: Please refer to Routing Comments.

## 2023-06-17 NOTE — Telephone Encounter (Signed)
 Spoke with the patient's daughter and the AWV was done on 06/16/2023.  Copied from CRM [id number]. Topic: Appointments - Scheduling Inquiry for Clinic >> Jun 16, 2023  3:07 PM Karole Pacer C wrote: Reason for CRM: Patients  daughter Doria Garden states she doesn't know how to access mychart and would like the Medicare AWV to be telephone. Patient can be reached at [phone] (M)

## 2023-06-18 ENCOUNTER — Other Ambulatory Visit: Payer: Self-pay | Admitting: Student

## 2023-06-18 ENCOUNTER — Other Ambulatory Visit (HOSPITAL_COMMUNITY): Payer: Self-pay

## 2023-06-21 NOTE — Telephone Encounter (Signed)
 Medication discontinued 09/25/22

## 2023-06-28 ENCOUNTER — Other Ambulatory Visit (HOSPITAL_COMMUNITY): Payer: Self-pay

## 2023-07-18 DIAGNOSIS — R5381 Other malaise: Secondary | ICD-10-CM | POA: Diagnosis not present

## 2023-07-23 ENCOUNTER — Encounter: Payer: Self-pay | Admitting: *Deleted

## 2023-09-24 ENCOUNTER — Other Ambulatory Visit: Payer: Self-pay | Admitting: Student

## 2023-09-24 ENCOUNTER — Other Ambulatory Visit: Payer: Self-pay | Admitting: Internal Medicine

## 2023-09-24 ENCOUNTER — Other Ambulatory Visit (HOSPITAL_COMMUNITY): Payer: Self-pay

## 2023-09-24 DIAGNOSIS — Z794 Long term (current) use of insulin: Secondary | ICD-10-CM

## 2023-09-27 ENCOUNTER — Other Ambulatory Visit: Payer: Self-pay

## 2023-09-27 ENCOUNTER — Other Ambulatory Visit (HOSPITAL_BASED_OUTPATIENT_CLINIC_OR_DEPARTMENT_OTHER): Payer: Self-pay

## 2023-09-27 MED ORDER — OMEPRAZOLE 20 MG PO CPDR
20.0000 mg | DELAYED_RELEASE_CAPSULE | Freq: Every day | ORAL | 1 refills | Status: DC
  Filled 2023-09-27: qty 90, 90d supply, fill #0
  Filled 2024-01-16: qty 90, 90d supply, fill #1

## 2023-09-27 MED ORDER — ROSUVASTATIN CALCIUM 20 MG PO TABS
20.0000 mg | ORAL_TABLET | Freq: Every day | ORAL | 1 refills | Status: DC
  Filled 2023-09-27: qty 90, 90d supply, fill #0
  Filled 2024-01-16: qty 90, 90d supply, fill #1

## 2023-09-27 MED ORDER — CARVEDILOL 6.25 MG PO TABS
6.2500 mg | ORAL_TABLET | Freq: Two times a day (BID) | ORAL | 3 refills | Status: AC
  Filled 2023-09-27: qty 180, 90d supply, fill #0
  Filled 2024-01-16: qty 180, 90d supply, fill #1
  Filled 2024-03-07: qty 60, 30d supply, fill #2

## 2023-09-27 NOTE — Telephone Encounter (Signed)
 Medication sent to pharmacy

## 2023-09-29 ENCOUNTER — Other Ambulatory Visit (HOSPITAL_COMMUNITY): Payer: Self-pay

## 2023-09-29 ENCOUNTER — Other Ambulatory Visit: Payer: Self-pay

## 2023-09-29 MED ORDER — EMPAGLIFLOZIN 10 MG PO TABS
10.0000 mg | ORAL_TABLET | Freq: Every day | ORAL | 3 refills | Status: AC
  Filled 2023-09-29: qty 90, 90d supply, fill #0
  Filled 2024-01-16: qty 90, 90d supply, fill #1
  Filled 2024-03-07: qty 90, 90d supply, fill #2

## 2023-11-06 ENCOUNTER — Other Ambulatory Visit: Payer: Self-pay | Admitting: Student

## 2023-11-06 NOTE — Progress Notes (Signed)
 Received page on on-call pager from patients daughter stating that she was breathing funny. States pt woke up this morning and had loud wheezes, she was given an inhaler and some dayquil and felt much better with resolution of symptoms. This has not happened before, and pt is declining any chest pain.   She does not feel short of breath, and is still doing her day to day activities such as eating, drinking, and bathing without distress.   Will reach out to front desk and schedule her an appt for follow up, I don't see asthma on her problem list or history of asthma, but reassuring that symptoms have resolved.

## 2023-11-07 ENCOUNTER — Emergency Department (HOSPITAL_COMMUNITY)

## 2023-11-07 ENCOUNTER — Encounter (HOSPITAL_COMMUNITY): Payer: Self-pay

## 2023-11-07 ENCOUNTER — Other Ambulatory Visit: Payer: Self-pay

## 2023-11-07 ENCOUNTER — Inpatient Hospital Stay (HOSPITAL_COMMUNITY)
Admission: EM | Admit: 2023-11-07 | Discharge: 2023-11-10 | DRG: 194 | Disposition: A | Discharge status: Home / Self Care | Attending: Internal Medicine | Admitting: Internal Medicine

## 2023-11-07 DIAGNOSIS — J159 Unspecified bacterial pneumonia: Principal | ICD-10-CM | POA: Diagnosis present

## 2023-11-07 DIAGNOSIS — Z23 Encounter for immunization: Secondary | ICD-10-CM | POA: Diagnosis present

## 2023-11-07 DIAGNOSIS — I5023 Acute on chronic systolic (congestive) heart failure: Secondary | ICD-10-CM | POA: Diagnosis not present

## 2023-11-07 DIAGNOSIS — Z902 Acquired absence of lung [part of]: Secondary | ICD-10-CM

## 2023-11-07 DIAGNOSIS — E1122 Type 2 diabetes mellitus with diabetic chronic kidney disease: Secondary | ICD-10-CM | POA: Diagnosis present

## 2023-11-07 DIAGNOSIS — E1159 Type 2 diabetes mellitus with other circulatory complications: Secondary | ICD-10-CM | POA: Diagnosis not present

## 2023-11-07 DIAGNOSIS — Z7982 Long term (current) use of aspirin: Secondary | ICD-10-CM | POA: Diagnosis not present

## 2023-11-07 DIAGNOSIS — I5032 Chronic diastolic (congestive) heart failure: Secondary | ICD-10-CM | POA: Diagnosis present

## 2023-11-07 DIAGNOSIS — E1151 Type 2 diabetes mellitus with diabetic peripheral angiopathy without gangrene: Secondary | ICD-10-CM | POA: Diagnosis present

## 2023-11-07 DIAGNOSIS — Z833 Family history of diabetes mellitus: Secondary | ICD-10-CM | POA: Diagnosis not present

## 2023-11-07 DIAGNOSIS — Z8249 Family history of ischemic heart disease and other diseases of the circulatory system: Secondary | ICD-10-CM | POA: Diagnosis not present

## 2023-11-07 DIAGNOSIS — Z794 Long term (current) use of insulin: Secondary | ICD-10-CM

## 2023-11-07 DIAGNOSIS — K219 Gastro-esophageal reflux disease without esophagitis: Secondary | ICD-10-CM | POA: Diagnosis present

## 2023-11-07 DIAGNOSIS — Z6832 Body mass index (BMI) 32.0-32.9, adult: Secondary | ICD-10-CM | POA: Diagnosis not present

## 2023-11-07 DIAGNOSIS — Z79899 Other long term (current) drug therapy: Secondary | ICD-10-CM

## 2023-11-07 DIAGNOSIS — Z87891 Personal history of nicotine dependence: Secondary | ICD-10-CM | POA: Diagnosis not present

## 2023-11-07 DIAGNOSIS — H903 Sensorineural hearing loss, bilateral: Secondary | ICD-10-CM | POA: Diagnosis not present

## 2023-11-07 DIAGNOSIS — R0902 Hypoxemia: Principal | ICD-10-CM

## 2023-11-07 DIAGNOSIS — I152 Hypertension secondary to endocrine disorders: Secondary | ICD-10-CM | POA: Diagnosis present

## 2023-11-07 DIAGNOSIS — J189 Pneumonia, unspecified organism: Secondary | ICD-10-CM | POA: Diagnosis not present

## 2023-11-07 DIAGNOSIS — N183 Chronic kidney disease, stage 3 unspecified: Secondary | ICD-10-CM | POA: Diagnosis present

## 2023-11-07 DIAGNOSIS — Z7902 Long term (current) use of antithrombotics/antiplatelets: Secondary | ICD-10-CM

## 2023-11-07 DIAGNOSIS — E66811 Obesity, class 1: Secondary | ICD-10-CM | POA: Diagnosis present

## 2023-11-07 DIAGNOSIS — E669 Obesity, unspecified: Secondary | ICD-10-CM | POA: Diagnosis present

## 2023-11-07 DIAGNOSIS — Z8673 Personal history of transient ischemic attack (TIA), and cerebral infarction without residual deficits: Secondary | ICD-10-CM | POA: Diagnosis not present

## 2023-11-07 DIAGNOSIS — E782 Mixed hyperlipidemia: Secondary | ICD-10-CM | POA: Diagnosis not present

## 2023-11-07 DIAGNOSIS — G40909 Epilepsy, unspecified, not intractable, without status epilepticus: Secondary | ICD-10-CM | POA: Diagnosis present

## 2023-11-07 DIAGNOSIS — H9190 Unspecified hearing loss, unspecified ear: Secondary | ICD-10-CM | POA: Diagnosis present

## 2023-11-07 DIAGNOSIS — Z85118 Personal history of other malignant neoplasm of bronchus and lung: Secondary | ICD-10-CM | POA: Diagnosis not present

## 2023-11-07 DIAGNOSIS — I739 Peripheral vascular disease, unspecified: Secondary | ICD-10-CM | POA: Diagnosis not present

## 2023-11-07 DIAGNOSIS — Z86711 Personal history of pulmonary embolism: Secondary | ICD-10-CM

## 2023-11-07 DIAGNOSIS — Z83438 Family history of other disorder of lipoprotein metabolism and other lipidemia: Secondary | ICD-10-CM

## 2023-11-07 DIAGNOSIS — I5030 Unspecified diastolic (congestive) heart failure: Secondary | ICD-10-CM | POA: Diagnosis not present

## 2023-11-07 DIAGNOSIS — E785 Hyperlipidemia, unspecified: Secondary | ICD-10-CM | POA: Diagnosis present

## 2023-11-07 DIAGNOSIS — E119 Type 2 diabetes mellitus without complications: Secondary | ICD-10-CM

## 2023-11-07 DIAGNOSIS — E1169 Type 2 diabetes mellitus with other specified complication: Secondary | ICD-10-CM | POA: Diagnosis present

## 2023-11-07 DIAGNOSIS — I10 Essential (primary) hypertension: Secondary | ICD-10-CM | POA: Diagnosis not present

## 2023-11-07 DIAGNOSIS — Z7984 Long term (current) use of oral hypoglycemic drugs: Secondary | ICD-10-CM

## 2023-11-07 LAB — CBC
HCT: 32.7 % — ABNORMAL LOW (ref 36.0–46.0)
HCT: 33.7 % — ABNORMAL LOW (ref 36.0–46.0)
Hemoglobin: 9.5 g/dL — ABNORMAL LOW (ref 12.0–15.0)
Hemoglobin: 9.6 g/dL — ABNORMAL LOW (ref 12.0–15.0)
MCH: 24.8 pg — ABNORMAL LOW (ref 26.0–34.0)
MCH: 25 pg — ABNORMAL LOW (ref 26.0–34.0)
MCHC: 28.5 g/dL — ABNORMAL LOW (ref 30.0–36.0)
MCHC: 29.1 g/dL — ABNORMAL LOW (ref 30.0–36.0)
MCV: 86.1 fL (ref 80.0–100.0)
MCV: 87.1 fL (ref 80.0–100.0)
Platelets: 196 K/uL (ref 150–400)
Platelets: 202 K/uL (ref 150–400)
RBC: 3.8 MIL/uL — ABNORMAL LOW (ref 3.87–5.11)
RBC: 3.87 MIL/uL (ref 3.87–5.11)
RDW: 15.7 % — ABNORMAL HIGH (ref 11.5–15.5)
RDW: 15.8 % — ABNORMAL HIGH (ref 11.5–15.5)
WBC: 4.6 K/uL (ref 4.0–10.5)
WBC: 4.9 K/uL (ref 4.0–10.5)
nRBC: 0 % (ref 0.0–0.2)
nRBC: 0 % (ref 0.0–0.2)

## 2023-11-07 LAB — CREATININE, SERUM
Creatinine, Ser: 0.89 mg/dL (ref 0.44–1.00)
GFR, Estimated: >60 mL/min (ref >60)

## 2023-11-07 LAB — BASIC METABOLIC PANEL WITH GFR
Anion gap: 9 (ref 5–15)
BUN: 14 mg/dL (ref 8–23)
CO2: 27 mmol/L (ref 22–32)
Calcium: 8.7 mg/dL — ABNORMAL LOW (ref 8.9–10.3)
Chloride: 109 mmol/L (ref 98–111)
Creatinine, Ser: 1.08 mg/dL — ABNORMAL HIGH (ref 0.44–1.00)
GFR, Estimated: 49 mL/min — ABNORMAL LOW (ref >60)
Glucose, Bld: 193 mg/dL — ABNORMAL HIGH (ref 70–99)
Potassium: 3.2 mmol/L — ABNORMAL LOW (ref 3.5–5.1)
Sodium: 145 mmol/L (ref 135–145)

## 2023-11-07 LAB — GLUCOSE, CAPILLARY: Glucose-Capillary: 219 mg/dL — ABNORMAL HIGH (ref 70–99)

## 2023-11-07 LAB — HEPATIC FUNCTION PANEL
ALT: 10 U/L (ref 0–44)
AST: 13 U/L — ABNORMAL LOW (ref 15–41)
Albumin: 3.1 g/dL — ABNORMAL LOW (ref 3.5–5.0)
Alkaline Phosphatase: 57 U/L (ref 38–126)
Bilirubin, Direct: <0.1 mg/dL (ref 0.0–0.2)
Total Bilirubin: 0.4 mg/dL (ref 0.0–1.2)
Total Protein: 6.7 g/dL (ref 6.5–8.1)

## 2023-11-07 LAB — I-STAT CG4 LACTIC ACID, ED: Lactic Acid, Venous: 1.7 mmol/L (ref 0.5–1.9)

## 2023-11-07 LAB — BRAIN NATRIURETIC PEPTIDE: B Natriuretic Peptide: 28.9 pg/mL (ref 0.0–100.0)

## 2023-11-07 LAB — TROPONIN I (HIGH SENSITIVITY)
Troponin I (High Sensitivity): 6 ng/L (ref <18)
Troponin I (High Sensitivity): 5 ng/L (ref <18)

## 2023-11-07 MED ORDER — METHYLPREDNISOLONE SODIUM SUCC 125 MG IJ SOLR
125.0000 mg | Freq: Once | INTRAMUSCULAR | Status: AC
  Administered 2023-11-07: 125 mg via INTRAVENOUS
  Filled 2023-11-07: qty 2

## 2023-11-07 MED ORDER — IOHEXOL 350 MG/ML SOLN
200.0000 mL | Freq: Once | INTRAVENOUS | Status: AC | PRN
  Administered 2023-11-07: 200 mL via INTRAVENOUS

## 2023-11-07 MED ORDER — CILOSTAZOL 100 MG PO TABS
100.0000 mg | ORAL_TABLET | Freq: Two times a day (BID) | ORAL | Status: DC
  Administered 2023-11-07 – 2023-11-10 (×7): 100 mg via ORAL
  Filled 2023-11-07 (×8): qty 1

## 2023-11-07 MED ORDER — IPRATROPIUM-ALBUTEROL 0.5-2.5 (3) MG/3ML IN SOLN
3.0000 mL | Freq: Four times a day (QID) | RESPIRATORY_TRACT | Status: DC | PRN
  Administered 2023-11-09 (×2): 3 mL via RESPIRATORY_TRACT
  Filled 2023-11-07 (×2): qty 3

## 2023-11-07 MED ORDER — ASPIRIN 81 MG PO TBEC
81.0000 mg | DELAYED_RELEASE_TABLET | Freq: Every day | ORAL | Status: DC
Start: 2023-11-07 — End: 2023-11-10
  Administered 2023-11-07 – 2023-11-10 (×4): 81 mg via ORAL
  Filled 2023-11-07 (×4): qty 1

## 2023-11-07 MED ORDER — INFLUENZA VAC SPLIT HIGH-DOSE 0.5 ML IM SUSY
0.5000 mL | PREFILLED_SYRINGE | INTRAMUSCULAR | Status: AC
  Administered 2023-11-09: 0.5 mL via INTRAMUSCULAR
  Filled 2023-11-07: qty 0.5

## 2023-11-07 MED ORDER — ALBUTEROL SULFATE (2.5 MG/3ML) 0.083% IN NEBU
INHALATION_SOLUTION | RESPIRATORY_TRACT | Status: AC
  Filled 2023-11-07: qty 12

## 2023-11-07 MED ORDER — ENOXAPARIN SODIUM 40 MG/0.4ML IJ SOSY
40.0000 mg | PREFILLED_SYRINGE | Freq: Every day | INTRAMUSCULAR | Status: DC
  Administered 2023-11-07 – 2023-11-10 (×4): 40 mg via SUBCUTANEOUS
  Filled 2023-11-07 (×4): qty 0.4

## 2023-11-07 MED ORDER — PANTOPRAZOLE SODIUM 40 MG PO TBEC
40.0000 mg | DELAYED_RELEASE_TABLET | Freq: Every day | ORAL | Status: DC
  Administered 2023-11-07 – 2023-11-10 (×4): 40 mg via ORAL
  Filled 2023-11-07 (×4): qty 1

## 2023-11-07 MED ORDER — LEVETIRACETAM 750 MG PO TABS
750.0000 mg | ORAL_TABLET | Freq: Two times a day (BID) | ORAL | Status: DC
  Administered 2023-11-07 – 2023-11-10 (×7): 750 mg via ORAL
  Filled 2023-11-07 (×8): qty 1

## 2023-11-07 MED ORDER — ORAL CARE MOUTH RINSE: 15.0000 mL | OROMUCOSAL | Status: DC | PRN

## 2023-11-07 MED ORDER — IPRATROPIUM-ALBUTEROL 0.5-2.5 (3) MG/3ML IN SOLN
3.0000 mL | Freq: Once | RESPIRATORY_TRACT | Status: AC
  Administered 2023-11-07: 3 mL via RESPIRATORY_TRACT
  Filled 2023-11-07: qty 3

## 2023-11-07 MED ORDER — AZITHROMYCIN 250 MG PO TABS
500.0000 mg | ORAL_TABLET | Freq: Every day | ORAL | Status: AC
  Administered 2023-11-08 – 2023-11-09 (×2): 500 mg via ORAL
  Filled 2023-11-07 (×2): qty 2

## 2023-11-07 MED ORDER — CARVEDILOL 6.25 MG PO TABS
6.2500 mg | ORAL_TABLET | Freq: Two times a day (BID) | ORAL | Status: DC
  Administered 2023-11-07 – 2023-11-10 (×7): 6.25 mg via ORAL
  Filled 2023-11-07 (×7): qty 1

## 2023-11-07 MED ORDER — SODIUM CHLORIDE 0.9 % IV SOLN
1.0000 g | INTRAVENOUS | Status: DC
  Administered 2023-11-08 – 2023-11-10 (×3): 1 g via INTRAVENOUS
  Filled 2023-11-07 (×4): qty 10

## 2023-11-07 MED ORDER — ROSUVASTATIN CALCIUM 20 MG PO TABS
20.0000 mg | ORAL_TABLET | Freq: Every day | ORAL | Status: DC
  Administered 2023-11-07 – 2023-11-10 (×4): 20 mg via ORAL
  Filled 2023-11-07 (×4): qty 1

## 2023-11-07 MED ORDER — FLUOXETINE HCL 10 MG PO CAPS
10.0000 mg | ORAL_CAPSULE | Freq: Every day | ORAL | Status: DC
  Administered 2023-11-08 – 2023-11-10 (×3): 10 mg via ORAL
  Filled 2023-11-07 (×3): qty 1

## 2023-11-07 MED ORDER — ALBUTEROL SULFATE (2.5 MG/3ML) 0.083% IN NEBU
10.0000 mg/h | INHALATION_SOLUTION | Freq: Once | RESPIRATORY_TRACT | Status: AC
  Administered 2023-11-07: 10 mg/h via RESPIRATORY_TRACT

## 2023-11-07 MED ORDER — MAGNESIUM SULFATE 2 GM/50ML IV SOLN
2.0000 g | Freq: Once | INTRAVENOUS | Status: AC
  Administered 2023-11-07: 2 g via INTRAVENOUS
  Filled 2023-11-07: qty 50

## 2023-11-07 MED ORDER — EMPAGLIFLOZIN 10 MG PO TABS
10.0000 mg | ORAL_TABLET | Freq: Every day | ORAL | Status: DC
  Administered 2023-11-07 – 2023-11-10 (×4): 10 mg via ORAL
  Filled 2023-11-07 (×4): qty 1

## 2023-11-07 MED ORDER — ALBUTEROL SULFATE (2.5 MG/3ML) 0.083% IN NEBU
INHALATION_SOLUTION | RESPIRATORY_TRACT | Status: AC
  Administered 2023-11-07: 10 mg
  Filled 2023-11-07: qty 12

## 2023-11-07 MED ORDER — AZITHROMYCIN 250 MG PO TABS
500.0000 mg | ORAL_TABLET | Freq: Once | ORAL | Status: AC
  Administered 2023-11-07: 500 mg via ORAL
  Filled 2023-11-07: qty 2

## 2023-11-07 MED ORDER — LOSARTAN POTASSIUM 50 MG PO TABS
50.0000 mg | ORAL_TABLET | Freq: Every day | ORAL | Status: DC
  Administered 2023-11-07 – 2023-11-10 (×4): 50 mg via ORAL
  Filled 2023-11-07 (×4): qty 1

## 2023-11-07 MED ORDER — SODIUM CHLORIDE 0.9 % IV SOLN
1.0000 g | Freq: Once | INTRAVENOUS | Status: AC
  Administered 2023-11-07: 1 g via INTRAVENOUS
  Filled 2023-11-07: qty 10

## 2023-11-07 NOTE — ED Provider Notes (Signed)
 [place] EMERGENCY DEPARTMENT AT [facility] Provider Note   CSN: [id number] Arrival date & time: 11/07/23  0021     Patient presents with: Shortness of Breath   [name] is a 89 y.o. female who presents with concern for wheezing and shortness of breath for 24 hours, I can hear the patient wheezing from the doorway.  Patient adamantly denies any symptoms but also was very resistant to presenting to the ED according to her daughter at the bedside.  Patient not very forthcoming with her symptomatology.  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  She is on aspirin  and cilostazol .   HPI     Prior to Admission medications   Medication Sig Start Date End Date Taking? Authorizing Provider  Accu-Chek FastClix Lancets MISC CHECK BLOOD SUGAR THREE TIMES A DAY. DIAG CODE E11.9. INSULIN  DEPENDENT 10/23/20   Jinwala, Sagar H, MD  acetaminophen -codeine  (TYLENOL  #3) 300-30 MG tablet TAKE 1 TABLET BY MOUTH DAILY AS NEEDED FOR MODERATE PAIN OR SEVERE PAIN. Patient not taking: Reported on 09/22/2022 08/24/22   Atway, Rayann N, DO  aspirin  EC 81 MG tablet Take 1 tablet (81 mg total) by mouth daily. 02/18/21   Sherlean Failing, MD  barrier cream (NON-SPECIFIED) CREA Apply 1 application topically 2 (two) times daily as needed (place on vulva twice daily as needed for redness and swelling). Patient not taking: Reported on 06/30/2022 10/23/20   Jinwala, Sagar H, MD  Blood Glucose Monitoring Suppl (ACCU-CHEK GUIDE) w/Device KIT 1 each by Does not apply route 3 (three) times daily. 10/23/20   Jinwala, Sagar H, MD  carvedilol  (COREG ) 6.25 MG tablet Take 1 tablet (6.25 mg total) by mouth 2 (two) times daily with a meal. 09/27/23   Trudy Mliss Dragon, MD  cilostazol  (PLETAL ) 100 MG tablet Take 1 tablet (100 mg total) by mouth 2 (two) times daily. 12/29/22   Arellano Zameza, Priscila, MD  empagliflozin  (JARDIANCE ) 10 MG TABS tablet Take 1  tablet (10 mg total) by mouth daily. 09/29/23   Trudy Mliss Dragon, MD  FLUoxetine  (PROZAC ) 10 MG capsule Take 1 capsule (10 mg total) by mouth daily. 08/24/22   Atway, Rayann N, DO  glucose blood (ACCU-CHEK GUIDE) test strip Check blood sugar 3 times per day 10/23/20   Emmy Justus DEL, MD  Incontinence Supplies MISC For use to address bowel and bladder incontinence. 07/04/15   Primus Ditch, MD  Insulin  Pen Needle (BD PEN NEEDLE NANO U/F) 32G X 4 MM MISC USE TO ADMINISTER SOLIQUA  ONE TIME A DAY Patient not taking: Reported on 06/30/2022 01/01/21   Emmy Justus DEL, MD  Insulin  Pen Needle 32G X 4 MM MISC Use to administer soliqua  one time a day 12/23/20   Jinwala, Sagar H, MD  levETIRAcetam  (KEPPRA ) 750 MG tablet Take 1 tablet (750 mg total) by mouth 2 (two) times daily. 08/24/22   Atway, Rayann N, DO  losartan  (COZAAR ) 50 MG tablet Take 1 tablet (50 mg total) by mouth daily. 12/29/22   Arellano Zameza, Priscila, MD  omeprazole  (PRILOSEC) 20 MG capsule Take 1 capsule (20 mg total) by mouth daily. 09/27/23   Trudy Mliss Dragon, MD  rosuvastatin  (CRESTOR ) 20 MG tablet Take 1 tablet (20 mg total) by mouth daily. 09/27/23   Trudy Mliss Dragon, MD  metFORMIN  (GLUCOPHAGE -XR) 500 MG 24 hr tablet Take 1 tablet (500 mg total) by mouth daily with breakfast. 08/24/22 06/01/23  Atway, Rayann N, DO    Allergies: Patient has no known allergies.    Review of Systems  Respiratory:  Positive for shortness of breath and wheezing.     Updated Vital Signs BP (!) 153/63   Pulse 89   Temp 97.8 F (36.6 C) (Oral)   Resp 17   Ht 5' 4 (1.626 m)   Wt 86.2 kg   SpO2 100%   BMI 32.62 kg/m   Physical Exam Vitals and nursing note reviewed.  Constitutional:      Appearance: She is not toxic-appearing.  HENT:     Head: Normocephalic and atraumatic.     Mouth/Throat:     Mouth: Mucous membranes are moist.     Pharynx: No oropharyngeal exudate or posterior oropharyngeal erythema.  Eyes:     General:        Right  eye: No discharge.        Left eye: No discharge.     Conjunctiva/sclera: Conjunctivae normal.  Cardiovascular:     Rate and Rhythm: Normal rate and regular rhythm.     Pulses: Normal pulses.  Pulmonary:     Effort: Pulmonary effort is normal. Tachypnea present. No accessory muscle usage or respiratory distress.     Breath sounds: Examination of the right-upper field reveals wheezing. Examination of the left-upper field reveals wheezing. Examination of the right-middle field reveals wheezing. Examination of the left-middle field reveals wheezing. Examination of the right-lower field reveals wheezing. Examination of the left-lower field reveals wheezing. Wheezing present. No rales.  Chest:     Chest wall: No mass, tenderness or edema.  Abdominal:     General: Bowel sounds are normal. There is no distension.     Tenderness: There is no abdominal tenderness.  Musculoskeletal:        General: No deformity.     Cervical back: Neck supple.  Skin:    General: Skin is warm and dry.     Capillary Refill: Capillary refill takes less than 2 seconds.  Neurological:     General: No focal deficit present.     Mental Status: She is alert and oriented to person, place, and time. Mental status is at baseline.  Psychiatric:        Mood and Affect: Mood normal.     (all labs ordered are listed, but only abnormal results are displayed) Labs Reviewed  BASIC METABOLIC PANEL WITH GFR - Abnormal; Notable for the following components:      Result Value   Potassium 3.2 (*)    Glucose, Bld 193 (*)    Creatinine, Ser 1.08 (*)    Calcium  8.7 (*)    GFR, Estimated 49 (*)    All other components within normal limits  CBC - Abnormal; Notable for the following components:   RBC 3.80 (*)    Hemoglobin 9.5 (*)    HCT 32.7 (*)    MCH 25.0 (*)    MCHC 29.1 (*)    RDW 15.8 (*)    All other components within normal limits  HEPATIC FUNCTION PANEL - Abnormal; Notable for the following components:   Albumin 3.1  (*)    AST 13 (*)    All other components within normal limits  BRAIN NATRIURETIC PEPTIDE  CBC  CREATININE, SERUM  I-STAT CG4 LACTIC ACID, ED  TROPONIN I (HIGH SENSITIVITY)  TROPONIN I (HIGH SENSITIVITY)    EKG: EKG Interpretation Date/Time:  Sunday November 07 2023 00:27:25 EDT Ventricular Rate:  80 PR Interval:  194 QRS Duration:  147 QT Interval:  451 QTC Calculation: 521 R Axis:   -44  Text Interpretation: Sinus rhythm Nonspecific IVCD with LAD LVH with secondary repolarization abnormality Confirmed by Griselda Norris [phone]) on 11/07/2023 3:04:16 AM  Radiology: CT Angio Chest PE W and/or Wo Contrast Result Date: 11/07/2023 CLINICAL DATA:  Hypoxia. EXAM: CT ANGIOGRAPHY CHEST WITH CONTRAST TECHNIQUE: Multidetector CT imaging of the chest was performed using the standard protocol during bolus administration of intravenous contrast. Multiplanar CT image reconstructions and MIPs were obtained to evaluate the vascular anatomy. RADIATION DOSE REDUCTION: This exam was performed according to the departmental dose-optimization program which includes automated exposure control, adjustment of the mA and/or kV according to patient size and/or use of iterative reconstruction technique. CONTRAST:  OMNIPAQUE  IOHEXOL  350 MG/ML SOLN COMPARISON:  04/06/2013 FINDINGS: Cardiovascular: Multiple attempts were made to obtain diagnostic level opacification of the pulmonary arteries with repeat scanning and repeat contrast injection. Despite these attempts, optimal opacification of the pulmonary arteries could not be obtained. Within this limitation there is no large central embolus in the pulmonary outflow tract or either main pulmonary artery. No evidence for a filling defect in the lobar pulmonary arteries to either lung. Segmental and subsegmental pulmonary arteries are not reliably evaluated due to the above-stated limitations. Coronary artery calcification is evident. Moderate atherosclerotic  calcification is noted in the wall of the thoracic aorta. Mediastinum/Nodes: No mediastinal lymphadenopathy. There is no hilar lymphadenopathy. Esophagus is diffusely patulous. There is no axillary lymphadenopathy. Lungs/Pleura: Patchy airspace disease is seen in both lower lobes, left greater than right surgical staple line noted right upper lobe with adjacent scarring, similar to prior. Tiny right perifissural nodule on 70/10 is stable since 2015 consistent with benign etiology. No new overtly suspicious pulmonary nodule or mass. Tiny right pleural effusion. Upper Abdomen: 7.9 cm low-density lesion posterior right liver. This is been present on multiple prior studies including PET-CT from 11/18/2010 and has been previously characterized as cavernous hemangioma. Musculoskeletal: No worrisome lytic or sclerotic osseous abnormality. Review of the MIP images confirms the above findings. IMPRESSION: 1. Multiple attempts were made to obtain diagnostic level opacification of the pulmonary arteries with repeat scanning and repeat contrast injection. Despite these attempts, optimal opacification of the pulmonary arteries could not be obtained. Within this limitation there is no large central embolus in the pulmonary outflow tract or either main pulmonary artery. No evidence for a filling defect in the lobar pulmonary arteries to either lung. Segmental and subsegmental pulmonary arteries are not reliably evaluated due to the above-stated limitations. 2. Patchy airspace disease in both lower lobes, left greater than right. Imaging features may reflect atelectasis although cannot exclude multifocal pneumonia. 3. 7.9 cm low-density lesion posterior right liver. This has been present on multiple prior studies including PET-CT from 11/18/2010 and has been previously characterized as cavernous hemangioma. 4.  Aortic Atherosclerosis (ICD10-I70.0). Electronically Signed   By: Camellia Candle M.D.   On: 11/07/2023 06:38   DG Chest 2  View Result Date: 11/07/2023 CLINICAL DATA:  Shortness of breath, cough EXAM: CHEST - 2 VIEW COMPARISON:  12/06/2017 FINDINGS: Heart and mediastinal contours are within normal limits. Right hilar fullness. No confluent airspace opacities or effusions. No acute bony abnormality. IMPRESSION: Right hilar fullness. This could be further evaluated with chest CT with IV contrast. Electronically Signed   By: Franky Crease M.D.   On: 11/07/2023 01:07     Procedures   Medications Ordered in the ED  albuterol  (PROVENTIL ) (2.5  MG/3ML) 0.083% nebulizer solution (  See Procedure Record 11/07/23 0313)  enoxaparin  (LOVENOX ) injection 40 mg (has no administration in time range)  ipratropium-albuterol  (DUONEB) 0.5-2.5 (3) MG/3ML nebulizer solution 3 mL (3 mLs Nebulization Given 11/07/23 0151)  methylPREDNISolone  sodium succinate (SOLU-MEDROL ) 125 mg/2 mL injection 125 mg (125 mg Intravenous Given 11/07/23 0217)  albuterol  (PROVENTIL ) (2.5 MG/3ML) 0.083% nebulizer solution (10 mg/hr Nebulization Given 11/07/23 0159)  magnesium  sulfate IVPB 2 g 50 mL (0 g Intravenous Stopped 11/07/23 0330)  albuterol  (PROVENTIL ) (2.5 MG/3ML) 0.083% nebulizer solution (10 mg  Given 11/07/23 0310)  iohexol  (OMNIPAQUE ) 350 MG/ML injection 200 mL (200 mLs Intravenous Contrast Given 11/07/23 0602)    Clinical Course as of 11/07/23 0734  Sun Nov 07, 2023  0433 Patient newly hypoxic after completion of CAT, down to 84% on RA. Continues to wheeze despite CATx2, steroid, mag. Will obtain CT PE study. Patient placed on 3L O2 by [place].  [RS]  0606 Per CT tech Ronnald Harari we attempted to get this scan. We tried a couple times but each time it was not a good scan. The contrast was never really perfusing into the pulmonaries. We will send over what we got but I doubt it is going to be diagnostic for a PE. It was pretty odd, I am not sure what the cause is for that, the patient doesn't have a fistula in the left arm does she? [RS]  [phone] Consult to Dr.  Georgina, hospitalist who is agreeable to admitting this patient to his service.  I operationally care of this patient. [RS]    Clinical Course User Index [RS] Raziah Funnell, [place] SAUNDERS, PA-C                                 Medical Decision Making 89 year old female presents to the emergency department with tachypnea, wheezing which is audible from across the room.  Patient is not on any oxygen at home at baseline.  Wheezing to the lung fields bilaterally with tachypnea and mild accessory muscle use.  Cardiac exam is unremarkable, abdominal exam is benign.  Patient otherwise well-appearing.  Patient received a DuoNeb and 2 CATs, mag and Solu-Medrol  with some improvement in her wheezing but progressive downtrend of her oxygen, now hypoxic on room air to the mid 80s.  She is placed on 3 L supplemental provide nasal cannula with improvement her sats to 100%.  Amount and/or Complexity of Data Reviewed Labs: ordered.    Details:   CBC with anemia with hemoglobin of 9.5.  BMP with mild hypokalemia 3.2.  Hepatic function panel with mild hypoalbuminemia.  Lactic is normal, troponin normal, BNP is normal. Radiology: ordered.    Details: PE study nondiagnostic from a PE standpoint, question multifocal pneumonia.  There is a nearly 8 cm lesion in the right posterior liver present since 2012.  Left breast mass noted on my review of the CT PE study.   Risk Prescription drug management. Decision regarding hospitalization.      Patient will require admission to the hospital for hypoxia, antibiotics initiated for coverage for pneumonia.  Admitted to hospital medicine as above.  Londyn and her family voiced understanding of her medical evaluation and treatment plan. Each of their questions answered to their expressed satisfaction.  They are amenable to plan for admission at this time.  This chart was dictated using voice recognition software, Dragon. Despite the best efforts of this provider to proofread and  correct errors, errors may still occur which can change documentation meaning.      Final diagnoses:  Hypoxia  Pneumonia of both lungs due to infectious organism, unspecified part of lung    ED Discharge Orders     None          Bobette Pleasant JONELLE DEVONNA 11/07/23 0735    Griselda Norris, MD 11/07/23 1306

## 2023-11-07 NOTE — ED Triage Notes (Signed)
 Pt arrives from EMS for SOB starting approx 8hr ago, pt has exp wheezes on arrival. Given 2 duonebs in treatment with no improment. H/o lung cancer Aox4 en route  Upon arrival was able to share name, year but unsure of situation and location.   165/76  97% RA  90 HR 12 lead showed L bundle branch block

## 2023-11-07 NOTE — H&P (Addendum)
 History and Physical    Patient: [name] FMW:[id number] DOB: Aug 06, 1933 DOA: 11/07/2023 DOS: the patient was seen and examined on 11/07/2023 PCP: Trudy Mliss Dragon, MD  Patient coming from: Home  Chief Complaint:  Chief Complaint  Patient presents with   Shortness of Breath   HPI: [name] is a 89 y.o. female with medical history significant of lung cancer, HFpEF, CVA, HTN, HLD, PVD , seizures, and OA p/w multifocal CAP.   Pt is a poor historian and unwilling to communicate with me for unclear reasons. Per EDP, presents with concern for wheezing and shortness of breath for 24 hours, I can hear the patient wheezing from the doorway. Patient adamantly denies any symptoms but also was very resistant to presenting to the ED according to her daughter at the bedside. Patient not very forthcoming with her symptomatology. 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 She is on aspirin  and cilostazol .  In the ED, pt hypertensive and tachypneic W/ hypoxia (on 3L [place]; RA pta). Labs notable for K 3.2, and Cr 1.08-->0.89. CT chest w/ multifocal CAP. EDP started IV abx (CTX/azithromycin ) and requested medicine admission.   Review of Systems: As mentioned in the history of present illness. All other systems reviewed and are negative. Past Medical History:  Diagnosis Date   Adenocarcinoma of lung (HCC)     Right upper lobe adenocarcinoma. s/p right lower lobectomy 12/15/10   ARTHRITIS, KNEE 03/24/2006   CHF (congestive heart failure) (HCC)    CVA (cerebrovascular accident) (HCC) 2006   right embolic stroke, no residual deficits   Depression 12/02/2010   DIABETES MELLITUS, TYPE II 2000   Diverticulitis    GERD 12/04/2005   Hemangioma of liver 12/02/2010   History of meningioma of the brain 07/21/2010   Per MRI 10/2010 stable.    History of pulmonary embolism 04/06/2013   HYPERLIPIDEMIA 12/04/2005   HYPERTENSION  12/04/2005   Meningioma (HCC) 07/21/2010   Right upper lobe, Adenocarcinoma of lung    11/09 - BAL reactive findings no malignancy. 11/09 - PET low level activity in the RUL Dr Brantley was following with serial CT scans bc of concern this might be bronchoalveolar carcinoma. Last him him 03/2008 when she fell out of care.  Last imagining 2/10 - stable to minimal increase in RUL mass. CT chest 11/12/10 increasing in size PET scan 11/18/10 Interval increase in size and metabolic activity    Rotator cuff arthropathy 05/19/2021   Past Surgical History:  Procedure Laterality Date   ABDOMINAL HYSTERECTOMY     BREAST BIOPSY Left 04/06/2023   US  LT BREAST BX W LOC DEV EA ADD LESION IMG BX SPEC US  GUIDE 04/06/2023 [facility]   BREAST BIOPSY Left 04/06/2023   US  LT BREAST BX W LOC DEV 1ST LESION IMG BX SPEC US  GUIDE 04/06/2023 [facility]   BREAST BIOPSY Left 04/06/2023   US  LT BREAST BX W LOC DEV EA ADD LESION IMG BX SPEC US  GUIDE 04/06/2023 [facility]   BREAST BIOPSY Left 04/06/2023   US  LT BREAST BX W LOC DEV EA ADD LESION IMG BX SPEC US  GUIDE 04/06/2023 [facility]   CRANIOTOMY N/A 03/02/2013   Procedure: CRANIOTOMY TUMOR EXCISION;  Surgeon: Rockey LITTIE Peru, MD;  Location: [facility];  Service: Neurosurgery;  Laterality: N/A;  Bifrontal Craniotomy for tumor   Extracapsular cataract extraction with intraocular     lens implantation.  INCISION AND DRAINAGE PERIRECTAL ABSCESS N/A 01/19/2013   Procedure: IRRIGATION AND DEBRIDEMENT PERIRECTAL ABSCESS;  Surgeon: Dann FORBES Hummer, MD;  Location: [facility];  Service: General;  Laterality: N/A;   IR ANGIOGRAM EXTREMITY LEFT  04/02/2022   IR ANGIOGRAM SELECTIVE EACH ADDITIONAL VESSEL  04/02/2022   IR ANGIOGRAM SELECTIVE EACH ADDITIONAL VESSEL  04/02/2022   IR AORTAGRAM ABDOMINAL SERIALOGRAM  04/02/2022   IR FEM POP ART STENT INC PTA MOD SED  04/02/2022   IR ILIAC ART STENT INC PTA MOD SED  04/02/2022   IR RADIOLOGIST EVAL & MGMT  03/12/2022   IR  RADIOLOGIST EVAL & MGMT  03/24/2022   IR US  GUIDE VASC ACCESS LEFT  04/02/2022   IR US  GUIDE VASC ACCESS RIGHT  04/02/2022   RIGHT VATS,RIGHT THORACOTOMY,RIGHT LOWER LOBECTOMY WITH NODE DISSECTION     Video bronchoscope.  12/29/2007   [place]   Wide excision of left upper back mass.     Social History:  reports that she quit smoking about 23 years ago. Her smoking use included cigarettes. She has never used smokeless tobacco. She reports that she does not drink alcohol and does not use drugs.  No Known Allergies  Family History  Problem Relation Age of Onset   Hyperlipidemia Brother    Hypertension Brother    Diabetes Brother     Prior to Admission medications   Medication Sig Start Date End Date Taking? Authorizing Provider  aspirin  EC 81 MG tablet Take 1 tablet (81 mg total) by mouth daily. 02/18/21  Yes Christian, Rylee, MD  carvedilol  (COREG ) 6.25 MG tablet Take 1 tablet (6.25 mg total) by mouth 2 (two) times daily with a meal. 09/27/23  Yes Trudy Mliss Dragon, MD  cilostazol  (PLETAL ) 100 MG tablet Take 1 tablet (100 mg total) by mouth 2 (two) times daily. 12/29/22  Yes Arellano Zameza, Priscila, MD  empagliflozin  (JARDIANCE ) 10 MG TABS tablet Take 1 tablet (10 mg total) by mouth daily. 09/29/23  Yes Trudy Mliss Dragon, MD  FLUoxetine  (PROZAC ) 10 MG capsule Take 1 capsule (10 mg total) by mouth daily. 08/24/22  Yes Atway, Rayann N, DO  levETIRAcetam  (KEPPRA ) 750 MG tablet Take 1 tablet (750 mg total) by mouth 2 (two) times daily. 08/24/22  Yes Atway, Rayann N, DO  losartan  (COZAAR ) 50 MG tablet Take 1 tablet (50 mg total) by mouth daily. 12/29/22  Yes Arellano Zameza, Priscila, MD  omeprazole  (PRILOSEC) 20 MG capsule Take 1 capsule (20 mg total) by mouth daily. 09/27/23  Yes Trudy Mliss Dragon, MD  rosuvastatin  (CRESTOR ) 20 MG tablet Take 1 tablet (20 mg total) by mouth daily. 09/27/23  Yes Trudy Mliss Dragon, MD  Accu-Chek FastClix Lancets MISC CHECK BLOOD SUGAR THREE TIMES A DAY. DIAG  CODE E11.9. INSULIN  DEPENDENT 10/23/20   Jinwala, Sagar H, MD  Blood Glucose Monitoring Suppl (ACCU-CHEK GUIDE) w/Device KIT 1 each by Does not apply route 3 (three) times daily. 10/23/20   Jinwala, Sagar H, MD  glucose blood (ACCU-CHEK GUIDE) test strip Check blood sugar 3 times per day 10/23/20   Emmy Justus DEL, MD  Incontinence Supplies MISC For use to address bowel and bladder incontinence. 07/04/15   Primus Ditch, MD  Insulin  Pen Needle (BD PEN NEEDLE NANO U/F) 32G X 4 MM MISC USE TO ADMINISTER SOLIQUA  ONE TIME A DAY 01/01/21   Jinwala, Sagar H, MD  Insulin  Pen Needle 32G X 4 MM MISC Use to administer soliqua  one time a day 12/23/20   Jinwala, Sagar H,  MD  metFORMIN  (GLUCOPHAGE -XR) 500 MG 24 hr tablet Take 1 tablet (500 mg total) by mouth daily with breakfast. 08/24/22 06/01/23  Jimmy Anna SAILOR, DO    Physical Exam: Vitals:   11/07/23 0710 11/07/23 0815 11/07/23 0945 11/07/23 1000  BP:  (!) 169/67 (!) 159/76 (!) 166/71  Pulse:  93 95 95  Resp:  (!) 196 18 18  Temp:  98.5 F (36.9 C)  98.4 F (36.9 C)  TempSrc:  Oral  Oral  SpO2: 100% 99% 100% 99%  Weight:      Height:       General: Alert, oriented x3, resting comfortably in no acute distress HEENT: EOMI, oropharynx clear, moist mucous membranes, hearing intact Neck: Trachea midline and no gross thyromegaly Respiratory: Lungs clear to auscultation bilaterally with normal respiratory effort; no w/r/r Cardiovascular: Regular rate and rhythm w/o m/r/g Abdomen: Soft, nontender, nondistended. Positive bowel sounds MSK: No obvious joint deformities or swelling Skin: No obvious rashes or lesions Neurologic: Awake, alert, spontaneously moves all extremities, strength intact Psychiatric: Appropriate mood and affect, conversational and cooperative   Data Reviewed:  Lab Results  Component Value Date   WBC 4.9 11/07/2023   HGB 9.6 (L) 11/07/2023   HCT 33.7 (L) 11/07/2023   MCV 87.1 11/07/2023   PLT 202 11/07/2023   Lab Results   Component Value Date   GLUCOSE 193 (H) 11/07/2023   CALCIUM  8.7 (L) 11/07/2023   NA 145 11/07/2023   K 3.2 (L) 11/07/2023   CO2 27 11/07/2023   CL 109 11/07/2023   BUN 14 11/07/2023   CREATININE 0.89 11/07/2023   Lab Results  Component Value Date   ALT 10 11/07/2023   AST 13 (L) 11/07/2023   ALKPHOS 57 11/07/2023   BILITOT 0.4 11/07/2023   Lab Results  Component Value Date   INR 1.0 04/02/2022   INR 2.20 04/25/2013   INR 1.20 04/18/2013   Radiology: CT Angio Chest PE W and/or Wo Contrast Result Date: 11/07/2023 CLINICAL DATA:  Hypoxia. EXAM: CT ANGIOGRAPHY CHEST WITH CONTRAST TECHNIQUE: Multidetector CT imaging of the chest was performed using the standard protocol during bolus administration of intravenous contrast. Multiplanar CT image reconstructions and MIPs were obtained to evaluate the vascular anatomy. RADIATION DOSE REDUCTION: This exam was performed according to the departmental dose-optimization program which includes automated exposure control, adjustment of the mA and/or kV according to patient size and/or use of iterative reconstruction technique. CONTRAST:  OMNIPAQUE  IOHEXOL  350 MG/ML SOLN COMPARISON:  04/06/2013 FINDINGS: Cardiovascular: Multiple attempts were made to obtain diagnostic level opacification of the pulmonary arteries with repeat scanning and repeat contrast injection. Despite these attempts, optimal opacification of the pulmonary arteries could not be obtained. Within this limitation there is no large central embolus in the pulmonary outflow tract or either main pulmonary artery. No evidence for a filling defect in the lobar pulmonary arteries to either lung. Segmental and subsegmental pulmonary arteries are not reliably evaluated due to the above-stated limitations. Coronary artery calcification is evident. Moderate atherosclerotic calcification is noted in the wall of the thoracic aorta. Mediastinum/Nodes: No mediastinal lymphadenopathy. There is no  hilar lymphadenopathy. Esophagus is diffusely patulous. There is no axillary lymphadenopathy. Lungs/Pleura: Patchy airspace disease is seen in both lower lobes, left greater than right surgical staple line noted right upper lobe with adjacent scarring, similar to prior. Tiny right perifissural nodule on 70/10 is stable since 2015 consistent with benign etiology. No new overtly suspicious pulmonary nodule or mass. Tiny right pleural effusion. Upper  Abdomen: 7.9 cm low-density lesion posterior right liver. This is been present on multiple prior studies including PET-CT from 11/18/2010 and has been previously characterized as cavernous hemangioma. Musculoskeletal: No worrisome lytic or sclerotic osseous abnormality. Review of the MIP images confirms the above findings. IMPRESSION: 1. Multiple attempts were made to obtain diagnostic level opacification of the pulmonary arteries with repeat scanning and repeat contrast injection. Despite these attempts, optimal opacification of the pulmonary arteries could not be obtained. Within this limitation there is no large central embolus in the pulmonary outflow tract or either main pulmonary artery. No evidence for a filling defect in the lobar pulmonary arteries to either lung. Segmental and subsegmental pulmonary arteries are not reliably evaluated due to the above-stated limitations. 2. Patchy airspace disease in both lower lobes, left greater than right. Imaging features may reflect atelectasis although cannot exclude multifocal pneumonia. 3. 7.9 cm low-density lesion posterior right liver. This has been present on multiple prior studies including PET-CT from 11/18/2010 and has been previously characterized as cavernous hemangioma. 4.  Aortic Atherosclerosis (ICD10-I70.0). Electronically Signed   By: Camellia Candle M.D.   On: 11/07/2023 06:38   DG Chest 2 View Result Date: 11/07/2023 CLINICAL DATA:  Shortness of breath, cough EXAM: CHEST - 2 VIEW COMPARISON:  12/06/2017  FINDINGS: Heart and mediastinal contours are within normal limits. Right hilar fullness. No confluent airspace opacities or effusions. No acute bony abnormality. IMPRESSION: Right hilar fullness. This could be further evaluated with chest CT with IV contrast. Electronically Signed   By: Franky Crease M.D.   On: 11/07/2023 01:07    Assessment and Plan: 47F h/o lung cancer, HFpEF, CVA, HTN, HLD, PVD , seizures, and OA p/w multifocal CAP.   Multifocal CAP -IV CTX 1g daily to complete 5 day CAP course -PO azithromycin  500mg  daily to complete 3 day CAP course -Duonebs prn -Wean O2 as tolerated -Ambulatory pulse ox prior to d/c  HFpEF -PTA ASA, Crestor , Coreg , losartan , and Jardiance   HTN -PTA losartan  50mg  daily  PVD -PTA ASA and cilostazol   Seizure -PTA Keppra    Advance Care Planning:   Code Status: Full Code   Consults: N/A  Family Communication: Daughter  Severity of Illness: The appropriate patient status for this patient is INPATIENT. Inpatient status is judged to be reasonable and necessary in order to provide the required intensity of service to ensure the patient's safety. The patient's presenting symptoms, physical exam findings, and initial radiographic and laboratory data in the context of their chronic comorbidities is felt to place them at high risk for further clinical deterioration. Furthermore, it is not anticipated that the patient will be medically stable for discharge from the hospital within 2 midnights of admission.   * I certify that at the point of admission it is my clinical judgment that the patient will require inpatient hospital care spanning beyond 2 midnights from the point of admission due to high intensity of service, high risk for further deterioration and high frequency of surveillance required.*   ------- I spent 60 minutes reviewing previous notes, at the bedside counseling/discussing the treatment plan, and performing clinical  documentation.  Author: Marsha Ada, MD 11/07/2023 10:37 AM  For on call review [email or web address].

## 2023-11-08 DIAGNOSIS — I10 Essential (primary) hypertension: Secondary | ICD-10-CM

## 2023-11-08 DIAGNOSIS — I739 Peripheral vascular disease, unspecified: Secondary | ICD-10-CM | POA: Diagnosis not present

## 2023-11-08 DIAGNOSIS — E782 Mixed hyperlipidemia: Secondary | ICD-10-CM | POA: Diagnosis not present

## 2023-11-08 DIAGNOSIS — J189 Pneumonia, unspecified organism: Secondary | ICD-10-CM | POA: Diagnosis not present

## 2023-11-08 LAB — CBC
HCT: 33.1 % — ABNORMAL LOW (ref 36.0–46.0)
Hemoglobin: 9.9 g/dL — ABNORMAL LOW (ref 12.0–15.0)
MCH: 25.2 pg — ABNORMAL LOW (ref 26.0–34.0)
MCHC: 29.9 g/dL — ABNORMAL LOW (ref 30.0–36.0)
MCV: 84.2 fL (ref 80.0–100.0)
Platelets: 233 K/uL (ref 150–400)
RBC: 3.93 MIL/uL (ref 3.87–5.11)
RDW: 15.6 % — ABNORMAL HIGH (ref 11.5–15.5)
WBC: 7 K/uL (ref 4.0–10.5)
nRBC: 0 % (ref 0.0–0.2)

## 2023-11-08 LAB — BASIC METABOLIC PANEL WITH GFR
Anion gap: 7 (ref 5–15)
BUN: 20 mg/dL (ref 8–23)
CO2: 25 mmol/L (ref 22–32)
Calcium: 8.6 mg/dL — ABNORMAL LOW (ref 8.9–10.3)
Chloride: 111 mmol/L (ref 98–111)
Creatinine, Ser: 0.91 mg/dL (ref 0.44–1.00)
GFR, Estimated: >60 mL/min (ref >60)
Glucose, Bld: 145 mg/dL — ABNORMAL HIGH (ref 70–99)
Potassium: 3.4 mmol/L — ABNORMAL LOW (ref 3.5–5.1)
Sodium: 143 mmol/L (ref 135–145)

## 2023-11-08 NOTE — Progress Notes (Signed)
 Progress Note   Patient: [name] FMW:[id number] DOB: [birth year] DOA: 11/07/2023     1 DOS: the patient was seen and examined on 11/08/2023   Brief hospital course: [name] is a 89 y.o. female with medical history significant of lung cancer, HFpEF, CVA, hyperlipidemia, hypertension, PAD, seizures, and OA admitted to [facility] service for multifocal pneumonia.  Assessment and Plan: Multifocal CAP Continue IV rocephin  daily to complete 5 day CAP course Continue PO azithromycin  500mg  daily to complete 3 day CAP course Continue Duonebs prn Wean O2 as tolerated.  Will get 6 min walk test, pulse ox prior to d/c   HFpEF No acute exacerbation. Continue ASA, Crestor , Coreg , losartan , and Jardiance    Hypertension Continue home dose losartan  50mg  daily   PVD Continue ASA and cilostazol    Seizure Continue home dose Keppra     Out of bed to chair. Incentive spirometry. Nursing supportive care. Fall, aspiration precautions. Diet:  Diet Orders (From admission, onward)     Start     Ordered   11/07/23 0720  Diet Carb Modified Fluid consistency: Thin; Room service appropriate? Yes  Diet effective now       Question Answer Comment  Diet-HS Snack? Nothing   Calorie Level Medium 1600-2000   Fluid consistency: Thin   Room service appropriate? Yes      11/07/23 0719           DVT prophylaxis: enoxaparin  (LOVENOX ) injection 40 mg Start: 11/07/23 1000  Level of care: Med-Surg   Code Status: Full Code  Subjective: Patient is seen and examined today morning. She is sitting in chair. Cough better. She is on 2L supplemental oxygen. Family at bedside.  Physical Exam: Vitals:   11/07/23 1535 11/07/23 1919 11/08/23 0747 11/08/23 1430  BP: (!) 159/58 (!) 168/87 (!) 160/73 137/66  Pulse: 86 97 81 80  Resp: 16 18 17 16   Temp: 97.8 F (36.6 C)  98.4 F (36.9 C) (!) 97.5 F (36.4 C)  TempSrc: Oral Oral  Oral  SpO2: 99% 97% 95% 100%  Weight:      Height:        General -  Elderly African American female, no apparent distress HEENT - PERRLA, EOMI, atraumatic head, non tender sinuses. Lung - Clear, basal rales, rhonchi, wheezes. Heart - S1, S2 heard, no murmurs, rubs, trace pedal edema. Abdomen - Soft, non tender, bowel sounds good Neuro - Alert, awake and oriented x 3, non focal exam. Skin - Warm and dry.  Data Reviewed:      Latest Ref Rng & Units 11/08/2023    7:03 AM 11/07/2023    7:19 AM 11/07/2023   12:32 AM  CBC  WBC 4.0 - 10.5 K/uL 7.0  4.9  4.6   Hemoglobin 12.0 - 15.0 g/dL 9.9  9.6  9.5   Hematocrit 36.0 - 46.0 % 33.1  33.7  32.7   Platelets 150 - 400 K/uL 233  202  196       Latest Ref Rng & Units 11/08/2023    7:03 AM 11/07/2023    7:19 AM 11/07/2023   12:32 AM  BMP  Glucose 70 - 99 mg/dL 854   806   BUN 8 - 23 mg/dL 20   14   Creatinine 9.55 - 1.00 mg/dL 9.08  9.10  8.91   Sodium 135 - 145 mmol/L 143   145   Potassium 3.5 - 5.1 mmol/L 3.4   3.2   Chloride 98 - 111 mmol/L 111  109   CO2 22 - 32 mmol/L 25   27   Calcium  8.9 - 10.3 mg/dL 8.6   8.7    CT Angio Chest PE W and/or Wo Contrast Result Date: 11/07/2023 CLINICAL DATA:  Hypoxia. EXAM: CT ANGIOGRAPHY CHEST WITH CONTRAST TECHNIQUE: Multidetector CT imaging of the chest was performed using the Burns protocol during bolus administration of intravenous contrast. Multiplanar CT image reconstructions and MIPs were obtained to evaluate the vascular anatomy. RADIATION DOSE REDUCTION: This exam was performed according to the departmental dose-optimization program which includes automated exposure control, adjustment of the mA and/or kV according to patient size and/or use of iterative reconstruction technique. CONTRAST:  OMNIPAQUE  IOHEXOL  350 MG/ML SOLN COMPARISON:  04/06/2013 FINDINGS: Cardiovascular: Multiple attempts were made to obtain diagnostic level opacification of the pulmonary arteries with repeat scanning and repeat contrast injection. Despite these attempts, optimal  opacification of the pulmonary arteries could not be obtained. Within this limitation there is no large central embolus in the pulmonary outflow tract or either main pulmonary artery. No evidence for a filling defect in the lobar pulmonary arteries to either lung. Segmental and subsegmental pulmonary arteries are not reliably evaluated due to the above-stated limitations. Coronary artery calcification is evident. Moderate atherosclerotic calcification is noted in the wall of the thoracic aorta. Mediastinum/Nodes: No mediastinal lymphadenopathy. There is no hilar lymphadenopathy. Esophagus is diffusely patulous. There is no axillary lymphadenopathy. Lungs/Pleura: Patchy airspace disease is seen in both lower lobes, left greater than right surgical staple line noted right upper lobe with adjacent scarring, similar to prior. Tiny right perifissural nodule on 70/10 is stable since 2015 consistent with benign etiology. No new overtly suspicious pulmonary nodule or mass. Tiny right pleural effusion. Upper Abdomen: 7.9 cm low-density lesion posterior right liver. This is been present on multiple prior studies including PET-CT from 11/18/2010 and has been previously characterized as cavernous hemangioma. Musculoskeletal: No worrisome lytic or sclerotic osseous abnormality. Review of the MIP images confirms the above findings. IMPRESSION: 1. Multiple attempts were made to obtain diagnostic level opacification of the pulmonary arteries with repeat scanning and repeat contrast injection. Despite these attempts, optimal opacification of the pulmonary arteries could not be obtained. Within this limitation there is no large central embolus in the pulmonary outflow tract or either main pulmonary artery. No evidence for a filling defect in the lobar pulmonary arteries to either lung. Segmental and subsegmental pulmonary arteries are not reliably evaluated due to the above-stated limitations. 2. Patchy airspace disease in both lower  lobes, left greater than right. Imaging features may reflect atelectasis although cannot exclude multifocal pneumonia. 3. 7.9 cm low-density lesion posterior right liver. This has been present on multiple prior studies including PET-CT from 11/18/2010 and has been previously characterized as cavernous hemangioma. 4.  Aortic Atherosclerosis (ICD10-I70.0). Electronically Signed   By: Camellia Candle M.D.   On: 11/07/2023 06:38   DG Chest 2 View Result Date: 11/07/2023 CLINICAL DATA:  Shortness of breath, cough EXAM: CHEST - 2 VIEW COMPARISON:  12/06/2017 FINDINGS: Heart and mediastinal contours are within normal limits. Right hilar fullness. No confluent airspace opacities or effusions. No acute bony abnormality. IMPRESSION: Right hilar fullness. This could be further evaluated with chest CT with IV contrast. Electronically Signed   By: Franky Crease M.D.   On: 11/07/2023 01:07    Family Communication: Discussed with patient, family at bedside, understand and agree. All questions answered.  Disposition: Status is: Inpatient Remains inpatient appropriate because: IV antibiotics, wean O2.  Planned  Discharge Destination: Home with Home Health     Time spent: 45 minutes  Author: Concepcion Riser, MD 11/08/2023 7:15 PM Secure chat 7am to 7pm For on call review [email or web address].

## 2023-11-09 DIAGNOSIS — E782 Mixed hyperlipidemia: Secondary | ICD-10-CM | POA: Diagnosis not present

## 2023-11-09 DIAGNOSIS — J189 Pneumonia, unspecified organism: Secondary | ICD-10-CM | POA: Diagnosis not present

## 2023-11-09 DIAGNOSIS — I10 Essential (primary) hypertension: Secondary | ICD-10-CM | POA: Diagnosis not present

## 2023-11-09 DIAGNOSIS — I739 Peripheral vascular disease, unspecified: Secondary | ICD-10-CM | POA: Diagnosis not present

## 2023-11-09 MED ORDER — FUROSEMIDE 20 MG PO TABS
20.0000 mg | ORAL_TABLET | Freq: Once | ORAL | Status: AC
  Administered 2023-11-09: 20 mg via ORAL
  Filled 2023-11-09: qty 1

## 2023-11-09 NOTE — Progress Notes (Signed)
 CSW spoke with pt daughter Leoma regarding SDOH: transportation.  Per Seondra, she provides transportation for all pt medical appts with her own private vehicle.  No intervention needed. Cathlyn Ferry, MSW, LCSW 9/23/20252:48 PM

## 2023-11-09 NOTE — Plan of Care (Signed)
 Patient has no complaints of pain. Flu vaccine has been given without any side effects. Has occasional coughs Problem: Education: Goal: Knowledge of General Education information will improve Description: Including pain rating scale, medication(s)/side effects and non-pharmacologic comfort measures Outcome: Progressing   Problem: Clinical Measurements: Goal: Ability to maintain clinical measurements within normal limits will improve Outcome: Progressing   Problem: Clinical Measurements: Goal: Will remain free from infection Outcome: Progressing   Problem: Clinical Measurements: Goal: Diagnostic test results will improve Outcome: Progressing   Problem: Activity: Goal: Risk for activity intolerance will decrease Outcome: Progressing   Problem: Coping: Goal: Level of anxiety will decrease Outcome: Progressing   Problem: Pain Managment: Goal: General experience of comfort will improve and/or be controlled Outcome: Progressing   Problem: Safety: Goal: Ability to remain free from injury will improve Outcome: Progressing   Problem: Skin Integrity: Goal: Risk for impaired skin integrity will decrease Outcome: Progressing

## 2023-11-09 NOTE — Care Plan (Signed)
 Patient ambulated in the hallway with 1 assist. Patient had rest period in between 6-minute walk test. Patient is in Room air, has oxygen saturation from 94-100 % without complaining of shortness of breath.

## 2023-11-09 NOTE — Progress Notes (Signed)
 Progress Note   Patient: BRIAUNA GILMARTIN FMW:[id number] DOB: 12-07-1933 DOA: 11/07/2023     2 DOS: the patient was seen and examined on 11/09/2023   Brief hospital course: ELYANAH FARINO is a 89 y.o. female with medical history significant of lung cancer, HFpEF, CVA, hyperlipidemia, hypertension, PAD, seizures, and OA admitted to [facility] service for multifocal pneumonia.  Assessment and Plan: Multifocal CAP Continue IV rocephin  daily to complete 5 day CAP course Continue PO azithromycin  500mg  daily to complete 3 day CAP course Continue Duonebs prn Wean O2 as tolerated.  Will get 6 min walk test, pulse ox prior to d/c   HFpEF No acute exacerbation. Low dose Lasix  ordered. Echo as she has basal rales ordered/ Continue ASA, Crestor , Coreg , losartan , and Jardiance    Hypertension Continue home dose losartan  50mg  daily   PVD Continue ASA and cilostazol    Seizure Continue home dose Keppra     Out of bed to chair. Incentive spirometry. Nursing supportive care. Fall, aspiration precautions. Diet:  Diet Orders (From admission, onward)     Start     Ordered   11/07/23 0720  Diet Carb Modified Fluid consistency: Thin; Room service appropriate? Yes  Diet effective now       Question Answer Comment  Diet-HS Snack? Nothing   Calorie Level Medium 1600-2000   Fluid consistency: Thin   Room service appropriate? Yes      11/07/23 0719           DVT prophylaxis: enoxaparin  (LOVENOX ) injection 40 mg Start: 11/07/23 1000  Level of care: Med-Surg   Code Status: Full Code  Subjective: Patient is seen and examined today morning. She is sitting in chair. Off oxygen. Advised to work with incentive spirometry.  Physical Exam: Vitals:   11/09/23 0758 11/09/23 0759 11/09/23 1500 11/09/23 1514  BP: (!) 147/62 (!) 147/62 (!) 165/75 (!) 163/76  Pulse: 90 88 90   Resp: 20 20  20   Temp: 97.7 F (36.5 C) 97.7 F (36.5 C) 97.8 F (36.6 C) (!) 97.4 F (36.3 C)  TempSrc: Oral Oral  Oral   SpO2: 97% 98% 96% 99%  Weight:      Height:        General - Elderly African American female, no apparent distress HEENT - PERRLA, EOMI, atraumatic head, non tender sinuses. Lung - Clear, basal rales, rhonchi, wheezes. Heart - S1, S2 heard, no murmurs, rubs, trace pedal edema. Abdomen - Soft, non tender, bowel sounds good Neuro - Alert, awake and oriented x 3, non focal exam. Skin - Warm and dry.  Data Reviewed:      Latest Ref Rng & Units 11/08/2023    7:03 AM 11/07/2023    7:19 AM 11/07/2023   12:32 AM  CBC  WBC 4.0 - 10.5 K/uL 7.0  4.9  4.6   Hemoglobin 12.0 - 15.0 g/dL 9.9  9.6  9.5   Hematocrit 36.0 - 46.0 % 33.1  33.7  32.7   Platelets 150 - 400 K/uL 233  202  196       Latest Ref Rng & Units 11/08/2023    7:03 AM 11/07/2023    7:19 AM 11/07/2023   12:32 AM  BMP  Glucose 70 - 99 mg/dL 854   806   BUN 8 - 23 mg/dL 20   14   Creatinine 9.55 - 1.00 mg/dL 9.08  9.10  8.91   Sodium 135 - 145 mmol/L 143   145   Potassium 3.5 - 5.1 mmol/L  3.4   3.2   Chloride 98 - 111 mmol/L 111   109   CO2 22 - 32 mmol/L 25   27   Calcium  8.9 - 10.3 mg/dL 8.6   8.7    No results found.   Family Communication: Discussed with patient, daughter at bedside. They understand and agree. All questions answered.  Disposition: Status is: Inpatient Remains inpatient appropriate because: IV antibiotics, lasix , echo  Planned Discharge Destination: Home with Home Health     Time spent: 43 minutes  Author: Concepcion Riser, MD 11/09/2023 7:19 PM Secure chat 7am to 7pm For on call review [email or web address].

## 2023-11-10 ENCOUNTER — Other Ambulatory Visit (HOSPITAL_COMMUNITY): Payer: Self-pay

## 2023-11-10 ENCOUNTER — Inpatient Hospital Stay (HOSPITAL_COMMUNITY)

## 2023-11-10 DIAGNOSIS — E1159 Type 2 diabetes mellitus with other circulatory complications: Secondary | ICD-10-CM

## 2023-11-10 DIAGNOSIS — I5030 Unspecified diastolic (congestive) heart failure: Secondary | ICD-10-CM

## 2023-11-10 DIAGNOSIS — Z794 Long term (current) use of insulin: Secondary | ICD-10-CM

## 2023-11-10 DIAGNOSIS — H903 Sensorineural hearing loss, bilateral: Secondary | ICD-10-CM | POA: Diagnosis not present

## 2023-11-10 DIAGNOSIS — E785 Hyperlipidemia, unspecified: Secondary | ICD-10-CM

## 2023-11-10 DIAGNOSIS — E1151 Type 2 diabetes mellitus with diabetic peripheral angiopathy without gangrene: Secondary | ICD-10-CM

## 2023-11-10 DIAGNOSIS — J189 Pneumonia, unspecified organism: Secondary | ICD-10-CM | POA: Diagnosis not present

## 2023-11-10 DIAGNOSIS — I5023 Acute on chronic systolic (congestive) heart failure: Secondary | ICD-10-CM

## 2023-11-10 DIAGNOSIS — E1169 Type 2 diabetes mellitus with other specified complication: Secondary | ICD-10-CM | POA: Diagnosis not present

## 2023-11-10 DIAGNOSIS — I152 Hypertension secondary to endocrine disorders: Secondary | ICD-10-CM

## 2023-11-10 LAB — ECHOCARDIOGRAM COMPLETE
AR max vel: 2.32 cm2
AV Area VTI: 2.51 cm2
AV Area mean vel: 2.4 cm2
AV Mean grad: 3 mmHg
AV Peak grad: 4.9 mmHg
Ao pk vel: 1.11 m/s
Area-P 1/2: 6.65 cm2
Calc EF: 37.5 %
Height: 64 in
S' Lateral: 3.6 cm
Single Plane A2C EF: 45.9 %
Single Plane A4C EF: 32.5 %
Weight: 3040.58 [oz_av]

## 2023-11-10 MED ORDER — ALBUTEROL SULFATE (2.5 MG/3ML) 0.083% IN NEBU
3.0000 mL | INHALATION_SOLUTION | Freq: Four times a day (QID) | RESPIRATORY_TRACT | 12 refills | Status: AC | PRN
  Filled 2023-11-10: qty 90, 8d supply, fill #0

## 2023-11-10 MED ORDER — FUROSEMIDE 20 MG PO TABS
20.0000 mg | ORAL_TABLET | Freq: Every day | ORAL | 0 refills | Status: AC | PRN
  Filled 2023-11-10: qty 30, 30d supply, fill #0

## 2023-11-10 MED ORDER — PERFLUTREN LIPID MICROSPHERE
1.0000 mL | INTRAVENOUS | Status: AC | PRN
Start: 2023-11-10 — End: 2023-11-10
  Administered 2023-11-10: 2 mL via INTRAVENOUS

## 2023-11-10 MED ORDER — FLUOXETINE HCL 10 MG PO CAPS
10.0000 mg | ORAL_CAPSULE | Freq: Every day | ORAL | 1 refills | Status: DC
  Filled 2023-11-10 – 2023-12-13 (×2): qty 30, 30d supply, fill #0

## 2023-11-10 MED ORDER — AMOXICILLIN-POT CLAVULANATE 875-125 MG PO TABS
1.0000 | ORAL_TABLET | Freq: Two times a day (BID) | ORAL | 0 refills | Status: AC
  Filled 2023-11-10: qty 6, 3d supply, fill #0

## 2023-11-10 NOTE — Discharge Summary (Signed)
 Physician Discharge Summary   Patient: [name] MRN: [id number] DOB: [birth year]  Admit date:     11/07/2023  Discharge date: {dischdate:26783}  Discharge Physician: Concepcion Riser   PCP: Trudy Mliss Dragon, MD   Recommendations at discharge:  {Tip this will not be part of the note when signed- Example include specific recommendations for outpatient follow-up, pending tests to follow-up on. (Optional):26781}  ***  Discharge Diagnoses: Principal Problem:   CAP (community acquired pneumonia)  Resolved Problems:   * No resolved hospital problems. [facility] Course: No notes on file  Assessment and Plan: No notes have been filed under this hospital service. Service: Hospitalist     {Tip this will not be part of the note when signed Body mass index is 32.62 kg/m. , ,  (Optional):26781}  {(NOTE) Pain control PDMP Statment (Optional):26782} Consultants: *** Procedures performed: ***  Disposition: {Plan; Disposition:26390} Diet recommendation:  Discharge Diet Orders (From admission, onward)     Start     Ordered   11/10/23 0000  Diet - low sodium heart healthy        11/10/23 1234   11/10/23 0000  Diet Carb Modified        11/10/23 1234           {Diet_Plan:26776} DISCHARGE MEDICATION: Allergies as of 11/10/2023   No Known Allergies   Med Rec must be completed prior to using this [facility]***       Follow-up Information     Trudy Mliss Dragon, MD Follow up in 1 week(s).   Specialty: Internal Medicine Contact information: [address] [place], [address] [place] [address] [phone]                Discharge Exam: Fredricka Weights   11/07/23 0026  Weight: 86.2 kg   ***  Condition at discharge: {DC Condition:26389}  The results of significant diagnostics from this hospitalization (including imaging, microbiology, ancillary and laboratory) are listed below for reference.   Imaging Studies: CT Angio Chest PE W and/or Wo  Contrast Result Date: 11/07/2023 CLINICAL DATA:  Hypoxia. EXAM: CT ANGIOGRAPHY CHEST WITH CONTRAST TECHNIQUE: Multidetector CT imaging of the chest was performed using the standard protocol during bolus administration of intravenous contrast. Multiplanar CT image reconstructions and MIPs were obtained to evaluate the vascular anatomy. RADIATION DOSE REDUCTION: This exam was performed according to the departmental dose-optimization program which includes automated exposure control, adjustment of the mA and/or kV according to patient size and/or use of iterative reconstruction technique. CONTRAST:  OMNIPAQUE  IOHEXOL  350 MG/ML SOLN COMPARISON:  04/06/2013 FINDINGS: Cardiovascular: Multiple attempts were made to obtain diagnostic level opacification of the pulmonary arteries with repeat scanning and repeat contrast injection. Despite these attempts, optimal opacification of the pulmonary arteries could not be obtained. Within this limitation there is no large central embolus in the pulmonary outflow tract or either main pulmonary artery. No evidence for a filling defect in the lobar pulmonary arteries to either lung. Segmental and subsegmental pulmonary arteries are not reliably evaluated due to the above-stated limitations. Coronary artery calcification is evident. Moderate atherosclerotic calcification is noted in the wall of the thoracic aorta. Mediastinum/Nodes: No mediastinal lymphadenopathy. There is no hilar lymphadenopathy. Esophagus is diffusely patulous. There is no axillary lymphadenopathy. Lungs/Pleura: Patchy airspace disease is seen in both lower lobes, left greater than right surgical staple line noted right upper lobe with adjacent scarring, similar to prior. Tiny right perifissural nodule on 70/10 is stable since 2015 consistent with benign etiology. No  new overtly suspicious pulmonary nodule or mass. Tiny right pleural effusion. Upper Abdomen: 7.9 cm low-density lesion posterior right liver.  This is been present on multiple prior studies including PET-CT from 11/18/2010 and has been previously characterized as cavernous hemangioma. Musculoskeletal: No worrisome lytic or sclerotic osseous abnormality. Review of the MIP images confirms the above findings. IMPRESSION: 1. Multiple attempts were made to obtain diagnostic level opacification of the pulmonary arteries with repeat scanning and repeat contrast injection. Despite these attempts, optimal opacification of the pulmonary arteries could not be obtained. Within this limitation there is no large central embolus in the pulmonary outflow tract or either main pulmonary artery. No evidence for a filling defect in the lobar pulmonary arteries to either lung. Segmental and subsegmental pulmonary arteries are not reliably evaluated due to the above-stated limitations. 2. Patchy airspace disease in both lower lobes, left greater than right. Imaging features may reflect atelectasis although cannot exclude multifocal pneumonia. 3. 7.9 cm low-density lesion posterior right liver. This has been present on multiple prior studies including PET-CT from 11/18/2010 and has been previously characterized as cavernous hemangioma. 4.  Aortic Atherosclerosis (ICD10-I70.0). Electronically Signed   By: Camellia Candle M.D.   On: 11/07/2023 06:38   DG Chest 2 View Result Date: 11/07/2023 CLINICAL DATA:  Shortness of breath, cough EXAM: CHEST - 2 VIEW COMPARISON:  12/06/2017 FINDINGS: Heart and mediastinal contours are within normal limits. Right hilar fullness. No confluent airspace opacities or effusions. No acute bony abnormality. IMPRESSION: Right hilar fullness. This could be further evaluated with chest CT with IV contrast. Electronically Signed   By: Franky Crease M.D.   On: 11/07/2023 01:07    Microbiology: Results for orders placed or performed during the hospital encounter of 12/06/17  Urine culture     Status: Abnormal   Collection Time: 12/07/17  1:15 AM    Specimen: Urine, Random  Result Value Ref Range Status   Specimen Description URINE, RANDOM  Final   Special Requests   Final    NONE Performed at [facility], [address] [place], [place] [address]    Culture MULTIPLE SPECIES PRESENT, SUGGEST RECOLLECTION (A)  Final   Report Status 12/08/2017 FINAL  Final   *Note: Due to a large number of results and/or encounters for the requested time period, some results have not been displayed. A complete set of results can be found in Results Review.    Labs: CBC: Recent Labs  Lab 11/07/23 0032 11/07/23 0719 11/08/23 0703  WBC 4.6 4.9 7.0  HGB 9.5* 9.6* 9.9*  HCT 32.7* 33.7* 33.1*  MCV 86.1 87.1 84.2  PLT 196 202 233   Basic Metabolic Panel: Recent Labs  Lab 11/07/23 0032 11/07/23 0719 11/08/23 0703  NA 145  --  143  K 3.2*  --  3.4*  CL 109  --  111  CO2 27  --  25  GLUCOSE 193*  --  145*  BUN 14  --  20  CREATININE 1.08* 0.89 0.91  CALCIUM  8.7*  --  8.6*   Liver Function Tests: Recent Labs  Lab 11/07/23 0032  AST 13*  ALT 10  ALKPHOS 57  BILITOT 0.4  PROT 6.7  ALBUMIN 3.1*   CBG: Recent Labs  Lab 11/07/23 1041  GLUCAP 219*    Discharge time spent: {LESS THAN/GREATER THAN:26388} 30 minutes.  Signed: Concepcion Riser, MD [facility] 11/10/2023

## 2023-11-10 NOTE — Care Management Important Message (Signed)
 Important Message  Patient Details  Name: MERSEDES ALBER MRN: [id number] Date of Birth: 03-04-1933   Important Message Given:  Yes - Medicare IM     Jon Cruel 11/10/2023, 3:43 PM

## 2023-11-22 ENCOUNTER — Other Ambulatory Visit: Payer: Self-pay

## 2023-11-24 ENCOUNTER — Inpatient Hospital Stay: Payer: Self-pay

## 2023-11-25 ENCOUNTER — Other Ambulatory Visit: Payer: Self-pay

## 2023-11-25 ENCOUNTER — Other Ambulatory Visit (HOSPITAL_COMMUNITY): Payer: Self-pay

## 2023-11-25 MED ORDER — LEVETIRACETAM 750 MG PO TABS
750.0000 mg | ORAL_TABLET | Freq: Two times a day (BID) | ORAL | 3 refills | Status: AC
  Filled 2023-11-25 – 2023-11-26 (×2): qty 180, 90d supply, fill #0
  Filled 2024-02-17: qty 180, 90d supply, fill #1

## 2023-11-25 NOTE — Telephone Encounter (Signed)
 Per patients daughter, the patient has not had her medication in 2 days.

## 2023-11-25 NOTE — Telephone Encounter (Signed)
 Patients daughter is aware that patients rx has been refilled .

## 2023-11-26 ENCOUNTER — Other Ambulatory Visit (HOSPITAL_COMMUNITY): Payer: Self-pay

## 2023-11-26 ENCOUNTER — Other Ambulatory Visit: Payer: Self-pay

## 2023-11-29 ENCOUNTER — Other Ambulatory Visit: Payer: Self-pay

## 2023-11-29 ENCOUNTER — Ambulatory Visit: Admitting: Student

## 2023-11-29 ENCOUNTER — Encounter: Payer: Self-pay | Admitting: Student

## 2023-11-29 VITALS — BP 126/61 | HR 79 | Temp 98.0°F | Ht 64.0 in | Wt 191.0 lb

## 2023-11-29 DIAGNOSIS — I5020 Unspecified systolic (congestive) heart failure: Secondary | ICD-10-CM | POA: Diagnosis not present

## 2023-11-29 DIAGNOSIS — R059 Cough, unspecified: Secondary | ICD-10-CM

## 2023-11-29 DIAGNOSIS — Z794 Long term (current) use of insulin: Secondary | ICD-10-CM | POA: Diagnosis not present

## 2023-11-29 DIAGNOSIS — E1151 Type 2 diabetes mellitus with diabetic peripheral angiopathy without gangrene: Secondary | ICD-10-CM | POA: Diagnosis not present

## 2023-11-29 DIAGNOSIS — D649 Anemia, unspecified: Secondary | ICD-10-CM

## 2023-11-29 DIAGNOSIS — Z833 Family history of diabetes mellitus: Secondary | ICD-10-CM

## 2023-11-29 DIAGNOSIS — Z8701 Personal history of pneumonia (recurrent): Secondary | ICD-10-CM

## 2023-11-29 DIAGNOSIS — E1159 Type 2 diabetes mellitus with other circulatory complications: Secondary | ICD-10-CM

## 2023-11-29 DIAGNOSIS — I152 Hypertension secondary to endocrine disorders: Secondary | ICD-10-CM

## 2023-11-29 DIAGNOSIS — Z8249 Family history of ischemic heart disease and other diseases of the circulatory system: Secondary | ICD-10-CM

## 2023-11-29 DIAGNOSIS — Z7984 Long term (current) use of oral hypoglycemic drugs: Secondary | ICD-10-CM

## 2023-11-29 DIAGNOSIS — Z7189 Other specified counseling: Secondary | ICD-10-CM

## 2023-11-29 DIAGNOSIS — J189 Pneumonia, unspecified organism: Secondary | ICD-10-CM

## 2023-11-29 DIAGNOSIS — Z87891 Personal history of nicotine dependence: Secondary | ICD-10-CM

## 2023-11-29 LAB — POCT GLYCOSYLATED HEMOGLOBIN (HGB A1C): HbA1c, POC (controlled diabetic range): 7.8 % — AB (ref 0.0–7.0)

## 2023-11-29 LAB — GLUCOSE, CAPILLARY: Glucose-Capillary: 131 mg/dL — ABNORMAL HIGH (ref 70–99)

## 2023-11-29 NOTE — Patient Instructions (Signed)
 Thank you, [name], for allowing us  to provide your care today.  I am glad that you have been feeling better since leaving the hospital.  On your cardiac ultrasound in the hospital it showed that the pumping function of your heart was a little bit less than it has been.  To work this up further we will send you to cardiology and they may talk to you about procedures such as heart catheterization testing and starting new medications.  I think talking about your goals of care and going through the form I gave you which is called a MOST form is a great idea to do in general but also to think about if you would want to undergo this type of testing and changes in your medicine.  Please let us  know if you have any questions about the form I gave you or if you want to come in earlier than your follow-up to talk about it.  I will also look into the prescription for your Tylenol  3 and let you know if we can resume this when I call about your lab results.   I have ordered the following labs for you:   Lab Orders         Basic metabolic panel with GFR         CBC with Diff         Iron  and IBC ([id number])         Ferritin         POC Hbg A1C       Follow up: 3 months    Remember:  Should you have any questions or concerns please call the internal medicine clinic at [phone].     Fairy Pool, DO [facility]

## 2023-11-29 NOTE — Progress Notes (Unsigned)
 CC: Hospital Follow Up after admission from 11/07/2023 to 11/10/2023 for community-acquired pneumonia  HPI:  [name] is a 89 y.o. female with pertinent PMH of lung cancer, HFpEF, CVA, hyperlipidemia, hypertension, PAD, seizures, and OA who presents as above. Please see assessment and plan below for further details.  Medications: Current Outpatient Medications  Medication Instructions   Accu-Chek FastClix Lancets MISC CHECK BLOOD SUGAR THREE TIMES A DAY. DIAG CODE E11.9. INSULIN  DEPENDENT   albuterol  (PROVENTIL ) (2.5 MG/3ML) 0.083% nebulizer solution 3 mLs, Nebulization, Every 6 hours PRN   aspirin  EC 81 mg, Oral, Daily   Blood Glucose Monitoring Suppl (ACCU-CHEK GUIDE) w/Device KIT 1 each, Does not apply, 3 times daily   carvedilol  (COREG ) 6.25 mg, Oral, 2 times daily with meals   cilostazol  (PLETAL ) 100 mg, Oral, 2 times daily   FLUoxetine  (PROZAC ) 10 mg, Oral, Daily   furosemide  (LASIX ) 20 mg, Oral, Daily PRN   glucose blood (ACCU-CHEK GUIDE) test strip Check blood sugar 3 times per day   Incontinence Supplies MISC For use to address bowel and bladder incontinence.   Insulin  Pen Needle (BD PEN NEEDLE NANO U/F) 32G X 4 MM MISC USE TO ADMINISTER SOLIQUA  ONE TIME A DAY   Insulin  Pen Needle 32G X 4 MM MISC Use to administer soliqua  one time a day   Jardiance  10 mg, Oral, Daily   levETIRAcetam  (KEPPRA ) 750 mg, Oral, 2 times daily   losartan  (COZAAR ) 50 mg, Oral, Daily   omeprazole  (PRILOSEC) 20 mg, Oral, Daily   rosuvastatin  (CRESTOR ) 20 mg, Oral, Daily     Review of Systems:   Pertinent items noted in HPI and/or A&P.  Physical Exam:  Vitals:   11/29/23 1507 11/29/23 1515  BP: (!) 114/55 126/61  Pulse: 81 79  Temp: 98 F (36.7 C)   TempSrc: Oral   SpO2: 97%   Weight: 191 lb (86.6 kg)   Height: 5' 4 (1.626 m)     Constitutional: Chronically ill-appearing elderly female. In no acute distress. HEENT: Normocephalic, atraumatic, Sclera non-icteric, PERRL, EOM  intact Cardio:Regular rate and rhythm. 2+ bilateral radial pulses. Pulm:Clear to auscultation bilaterally. Normal work of breathing on room air. Skin:Warm and dry. Neuro:Alert and oriented.   Assessment & Plan:   Assessment & Plan Type 2 diabetes mellitus with diabetic peripheral angiopathy without gangrene, with long-term current use of insulin  (HCC) A1c slightly increased from 12/2022 when it was 7.0, now 7.8.  Currently on monotherapy with SGLT2 inhibitor which is appropriate. - Continue Jardiance  10 mg daily Community acquired pneumonia, unspecified laterality Admitted from 11/07/2023 - 11/10/2023 with multifocal community-acquired pneumonia and was treated with ceftriaxone  and azithromycin  before transition to Augmentin  for total of 7 days of antibiotics.  She still has a mild but improving cough but otherwise has not had any return of her shortness of breath and does not have worrisome sputum production. HFrEF (heart failure with reduced ejection fraction) (HCC) During hospitalization her echocardiogram showed newly reduced EF of 35 to 40%.  She is currently on beta-blocker, ARB, SGLT2 inhibitor, loop diuretic, statin, and aspirin .  Did have a nuclear medicine cardiac scan back in 2012 that showed some thinning at the apex that was nonspecific overall but otherwise no ischemic evaluation.  Discussed cardiology referral and possible addition/changing of medicines, additional tests, and other things that may have been in the workup of HFrEF with the patient and her daughter.  They are currently discussing her goals of care but would like to have the referral  sent to cardiology at this time. - Continue carvedilol  6.25 mg twice daily, Jardiance  10 mg daily, furosemide  20 mg daily as needed, losartan  50 mg daily - Referral to cardiology sent Normocytic anemia During her hospitalization her hemoglobin was around 9.5 with an MCV around 86.  No current signs of active or recent bleeding and CBC today  shows normalization of hemoglobin at 11.3.  Iron  panel within normal limits. Hypertension associated with diabetes (HCC) Blood pressure today well-controlled at 126/61.  BMP today shows stable renal function with GFR of 49 and no significant electrolyte abnormalities. - Continue carvedilol  6.25 mg twice daily and losartan  50 mg daily Goals of care, counseling/discussion During hospitalization her inpatient team discussed briefly her goals of care and recommended that they discuss this with their PCP.  Her daughter who is present at the visit is her POA.  We discussed the MOST form and provided them with a copy to take home and review.  Plan is for them to bring this to the next visit to possibly fill out and file into the chart.  Orders Placed This Encounter  Procedures   Basic metabolic panel with GFR   CBC with Diff   Iron  and IBC ([id number])   Ferritin   Glucose, capillary   Ambulatory referral to Cardiology    Referral Priority:   Routine    Referral Type:   Consultation    Referral Reason:   Specialty Services Required    Number of Visits Requested:   1   POC Hbg A1C     Return in about 3 months (around 02/29/2024) for Follow up with PCP.   Patient discussed with Dr. Mliss Foot  Fairy Pool, DO [facility] Internal Medicine Resident PGY-3 Clinic Phone: [phone] Please contact the on call pager at [phone] for any urgent or emergent needs.

## 2023-11-30 LAB — BASIC METABOLIC PANEL WITH GFR
BUN/Creatinine Ratio: 19 (ref 12–28)
BUN: 21 mg/dL (ref 8–27)
CO2: 24 mmol/L (ref 20–29)
Calcium: 9 mg/dL (ref 8.7–10.3)
Chloride: 107 mmol/L — ABNORMAL HIGH (ref 96–106)
Creatinine, Ser: 1.09 mg/dL — ABNORMAL HIGH (ref 0.57–1.00)
Glucose: 145 mg/dL — ABNORMAL HIGH (ref 70–99)
Potassium: 4.1 mmol/L (ref 3.5–5.2)
Sodium: 145 mmol/L — ABNORMAL HIGH (ref 134–144)
eGFR: 49 mL/min/1.73 — ABNORMAL LOW (ref >59)

## 2023-11-30 LAB — CBC WITH DIFFERENTIAL/PLATELET
Basophils Absolute: 0 x10E3/uL (ref 0.0–0.2)
Basos: 0 %
EOS (ABSOLUTE): 0.1 x10E3/uL (ref 0.0–0.4)
Eos: 2 %
Hematocrit: 38.6 % (ref 34.0–46.6)
Hemoglobin: 11.3 g/dL (ref 11.1–15.9)
Immature Grans (Abs): 0 x10E3/uL (ref 0.0–0.1)
Immature Granulocytes: 0 %
Lymphocytes Absolute: 1.8 x10E3/uL (ref 0.7–3.1)
Lymphs: 34 %
MCH: 24.7 pg — ABNORMAL LOW (ref 26.6–33.0)
MCHC: 29.3 g/dL — ABNORMAL LOW (ref 31.5–35.7)
MCV: 85 fL (ref 79–97)
Monocytes Absolute: 0.6 x10E3/uL (ref 0.1–0.9)
Monocytes: 11 %
Neutrophils Absolute: 2.8 x10E3/uL (ref 1.4–7.0)
Neutrophils: 53 %
Platelets: 288 x10E3/uL (ref 150–450)
RBC: 4.57 x10E6/uL (ref 3.77–5.28)
RDW: 14.4 % (ref 11.7–15.4)
WBC: 5.3 x10E3/uL (ref 3.4–10.8)

## 2023-11-30 LAB — IRON AND TIBC
Iron Saturation: 16 % (ref 15–55)
Iron: 41 ug/dL (ref 27–139)
Total Iron Binding Capacity: 255 ug/dL (ref 250–450)
UIBC: 214 ug/dL (ref 118–369)

## 2023-11-30 LAB — FERRITIN: Ferritin: 52 ng/mL (ref 15–150)

## 2023-12-02 DIAGNOSIS — I5020 Unspecified systolic (congestive) heart failure: Secondary | ICD-10-CM | POA: Insufficient documentation

## 2023-12-02 NOTE — Assessment & Plan Note (Signed)
 Admitted from 11/07/2023 - 11/10/2023 with multifocal community-acquired pneumonia and was treated with ceftriaxone  and azithromycin  before transition to Augmentin  for total of 7 days of antibiotics.  She still has a mild but improving cough but otherwise has not had any return of her shortness of breath and does not have worrisome sputum production.

## 2023-12-02 NOTE — Assessment & Plan Note (Signed)
 During hospitalization her echocardiogram showed newly reduced EF of 35 to 40%.  She is currently on beta-blocker, ARB, SGLT2 inhibitor, loop diuretic, statin, and aspirin .  Did have a nuclear medicine cardiac scan back in 2012 that showed some thinning at the apex that was nonspecific overall but otherwise no ischemic evaluation.  Discussed cardiology referral and possible addition/changing of medicines, additional tests, and other things that may have been in the workup of HFrEF with the patient and her daughter.  They are currently discussing her goals of care but would like to have the referral sent to cardiology at this time. - Continue carvedilol  6.25 mg twice daily, Jardiance  10 mg daily, furosemide  20 mg daily as needed, losartan  50 mg daily - Referral to cardiology sent

## 2023-12-02 NOTE — Assessment & Plan Note (Signed)
 During her hospitalization her hemoglobin was around 9.5 with an MCV around 86.  No current signs of active or recent bleeding and CBC today shows normalization of hemoglobin at 11.3.  Iron  panel within normal limits.

## 2023-12-02 NOTE — Assessment & Plan Note (Signed)
 Blood pressure today well-controlled at 126/61.  BMP today shows stable renal function with GFR of 49 and no significant electrolyte abnormalities. - Continue carvedilol  6.25 mg twice daily and losartan  50 mg daily

## 2023-12-02 NOTE — Assessment & Plan Note (Signed)
 A1c slightly increased from 12/2022 when it was 7.0, now 7.8.  Currently on monotherapy with SGLT2 inhibitor which is appropriate. - Continue Jardiance  10 mg daily

## 2023-12-06 NOTE — Progress Notes (Signed)
 Internal Medicine Clinic Attending  Case discussed with the resident at the time of the visit.  We reviewed the resident's history and exam and pertinent patient test results.  I agree with the assessment, diagnosis, and plan of care documented in the resident's note.   I think the most important piece of this visit today is clarifying goals of care. Dr. Jolaine reviewed the MOST form with patient & her family, and they will take it home. As she sees Cardiology for a new diagnosis of HFrEF, it will be important to consider her goals of care when making diagnostic/treatment choices.

## 2023-12-13 ENCOUNTER — Other Ambulatory Visit: Payer: Self-pay

## 2023-12-13 ENCOUNTER — Other Ambulatory Visit (HOSPITAL_COMMUNITY): Payer: Self-pay

## 2024-01-15 ENCOUNTER — Telehealth: Payer: Self-pay | Admitting: Pharmacist

## 2024-01-15 ENCOUNTER — Other Ambulatory Visit (HOSPITAL_COMMUNITY): Payer: Self-pay

## 2024-01-15 NOTE — Progress Notes (Signed)
 Pharmacy Quality Measure Review  This patient is appearing on a report for being at risk of failing the adherence measure for cholesterol (statin) and diabetes medications this calendar year.  Her PCP is in the Internal Medicine  clinic but she is still attributed to Dr Cleatus Specking per [facility].   Medication: rosuvastatin   Last fill date: 09/27/2023 for 90 day supply  Medication: Jardiance  Last fill date: 09/27/2023 for 90 day supply  She has refills remaining on both rosuvastatin  and Jardiance . She also should be due to fill losartan .    Tired to outreach patient - no answer. LM on VM with CB# [phone].  Tried to outreach daughter by phone - no answer and unable to leave a message / no voicemail set up.    Madelin Ray, PharmD Clinical Pharmacist [facility]  Population Health [phone]

## 2024-01-16 ENCOUNTER — Other Ambulatory Visit (HOSPITAL_COMMUNITY): Payer: Self-pay

## 2024-01-16 ENCOUNTER — Other Ambulatory Visit: Payer: Self-pay

## 2024-01-17 ENCOUNTER — Other Ambulatory Visit: Payer: Self-pay

## 2024-01-17 ENCOUNTER — Other Ambulatory Visit: Payer: Self-pay | Admitting: Student

## 2024-01-17 ENCOUNTER — Other Ambulatory Visit (HOSPITAL_COMMUNITY): Payer: Self-pay

## 2024-01-17 DIAGNOSIS — E1159 Type 2 diabetes mellitus with other circulatory complications: Secondary | ICD-10-CM

## 2024-01-17 MED ORDER — LOSARTAN POTASSIUM 50 MG PO TABS
50.0000 mg | ORAL_TABLET | Freq: Every day | ORAL | 3 refills | Status: AC
  Filled 2024-01-17: qty 90, 90d supply, fill #0
  Filled 2024-03-07: qty 30, 30d supply, fill #1

## 2024-01-17 NOTE — Telephone Encounter (Signed)
 Medication sent to pharmacy

## 2024-01-18 ENCOUNTER — Other Ambulatory Visit: Payer: Self-pay | Admitting: Internal Medicine

## 2024-01-18 ENCOUNTER — Other Ambulatory Visit (HOSPITAL_COMMUNITY): Payer: Self-pay

## 2024-01-18 MED ORDER — ROSUVASTATIN CALCIUM 20 MG PO TABS
20.0000 mg | ORAL_TABLET | Freq: Every day | ORAL | 0 refills | Status: AC
  Filled 2024-01-18: qty 90, 90d supply, fill #0
  Filled 2024-01-27: qty 30, 30d supply, fill #0
  Filled 2024-03-07 (×2): qty 30, 30d supply, fill #1

## 2024-01-18 MED ORDER — OMEPRAZOLE 20 MG PO CPDR
20.0000 mg | DELAYED_RELEASE_CAPSULE | Freq: Every day | ORAL | 0 refills | Status: AC
  Filled 2024-01-18 – 2024-01-27 (×2): qty 90, 90d supply, fill #0
  Filled 2024-01-27: qty 30, 30d supply, fill #0
  Filled 2024-03-07 (×2): qty 30, 30d supply, fill #1

## 2024-01-18 NOTE — Progress Notes (Signed)
 Pharmacy Quality Measure Review  This patient is appearing on a report for being at risk of failing the adherence measure for cholesterol (statin), diabetes, and hypertension (ACEi/ARB) medications this calendar year.   Medication: losartan  50 mg Last fill date: 01/17/24 for 90 day supply  Medication: Jardiance  10 mg Last fill date: 01/16/24 for 90 day supply  Medication: rosuvastatin  20 mg Last fill date: 01/16/24 for 90 day supply  Insurance report was not up to date. No action needed at this time. Refills remaining on losartan  and Jardiance ; no refills for rosuvastatin  remaining. Will need a refill prior to the next office visit. Will collaborate with the embedded pharmacist to facilitate future fills.   Woodie Jock, PharmD PGY1 Pharmacy Resident  01/18/2024

## 2024-01-19 ENCOUNTER — Other Ambulatory Visit (HOSPITAL_COMMUNITY): Payer: Self-pay

## 2024-01-25 NOTE — Progress Notes (Signed)
 Refill of rosuvastatin  has been sent by prescriber 01/17/25.   Lorain Baseman, PharmD [facility] [phone]

## 2024-01-27 ENCOUNTER — Other Ambulatory Visit: Payer: Self-pay

## 2024-01-27 ENCOUNTER — Other Ambulatory Visit (HOSPITAL_COMMUNITY): Payer: Self-pay

## 2024-03-06 ENCOUNTER — Ambulatory Visit: Payer: Self-pay

## 2024-03-07 ENCOUNTER — Other Ambulatory Visit: Payer: Self-pay | Admitting: Internal Medicine

## 2024-03-07 ENCOUNTER — Other Ambulatory Visit: Payer: Self-pay

## 2024-03-07 ENCOUNTER — Other Ambulatory Visit (HOSPITAL_BASED_OUTPATIENT_CLINIC_OR_DEPARTMENT_OTHER): Payer: Self-pay

## 2024-03-08 ENCOUNTER — Other Ambulatory Visit (HOSPITAL_BASED_OUTPATIENT_CLINIC_OR_DEPARTMENT_OTHER): Payer: Self-pay

## 2024-03-08 MED ORDER — FLUOXETINE HCL 10 MG PO CAPS
10.0000 mg | ORAL_CAPSULE | Freq: Every day | ORAL | 2 refills | Status: AC
  Filled 2024-03-08: qty 90, 90d supply, fill #0

## 2024-03-09 ENCOUNTER — Other Ambulatory Visit (HOSPITAL_COMMUNITY): Payer: Self-pay

## 2024-03-13 ENCOUNTER — Ambulatory Visit: Admitting: Student

## 2024-03-16 ENCOUNTER — Ambulatory Visit: Payer: Self-pay | Admitting: Student

## 2024-03-16 DIAGNOSIS — E1151 Type 2 diabetes mellitus with diabetic peripheral angiopathy without gangrene: Secondary | ICD-10-CM

## 2024-03-16 DIAGNOSIS — Z794 Long term (current) use of insulin: Secondary | ICD-10-CM

## 2024-03-16 NOTE — Progress Notes (Signed)
" ° °  Established Patient Office Visit  Subjective   Patient ID: [name], female    DOB: May 19, 1933  Age: 89 y.o. MRN: [id number]  Patient No-showed.  "

## 2024-06-21 ENCOUNTER — Ambulatory Visit
